# Patient Record
Sex: Female | Born: 1935 | Race: Black or African American | Hispanic: No | Smoking: Never smoker
Health system: Southern US, Community
[De-identification: ages and names within clinical notes are randomized; demographics above are authoritative.]

## PROBLEM LIST (undated history)

## (undated) DIAGNOSIS — R519 Headache, unspecified: Secondary | ICD-10-CM

## (undated) DIAGNOSIS — E119 Type 2 diabetes mellitus without complications: Secondary | ICD-10-CM

## (undated) DIAGNOSIS — M1990 Unspecified osteoarthritis, unspecified site: Secondary | ICD-10-CM

## (undated) DIAGNOSIS — E039 Hypothyroidism, unspecified: Secondary | ICD-10-CM

## (undated) DIAGNOSIS — N183 Chronic kidney disease, stage 3 unspecified: Secondary | ICD-10-CM

## (undated) DIAGNOSIS — I441 Atrioventricular block, second degree: Secondary | ICD-10-CM

## (undated) DIAGNOSIS — R51 Headache: Secondary | ICD-10-CM

## (undated) DIAGNOSIS — Z9109 Other allergy status, other than to drugs and biological substances: Secondary | ICD-10-CM

## (undated) DIAGNOSIS — R1013 Epigastric pain: Secondary | ICD-10-CM

## (undated) DIAGNOSIS — K259 Gastric ulcer, unspecified as acute or chronic, without hemorrhage or perforation: Secondary | ICD-10-CM

## (undated) DIAGNOSIS — I5042 Chronic combined systolic (congestive) and diastolic (congestive) heart failure: Secondary | ICD-10-CM

## (undated) DIAGNOSIS — H548 Legal blindness, as defined in USA: Secondary | ICD-10-CM

## (undated) DIAGNOSIS — I119 Hypertensive heart disease without heart failure: Secondary | ICD-10-CM

## (undated) DIAGNOSIS — I82629 Acute embolism and thrombosis of deep veins of unspecified upper extremity: Secondary | ICD-10-CM

## (undated) DIAGNOSIS — Z9289 Personal history of other medical treatment: Secondary | ICD-10-CM

## (undated) DIAGNOSIS — I480 Paroxysmal atrial fibrillation: Secondary | ICD-10-CM

## (undated) DIAGNOSIS — I2510 Atherosclerotic heart disease of native coronary artery without angina pectoris: Secondary | ICD-10-CM

## (undated) DIAGNOSIS — K449 Diaphragmatic hernia without obstruction or gangrene: Secondary | ICD-10-CM

## (undated) DIAGNOSIS — Z8719 Personal history of other diseases of the digestive system: Secondary | ICD-10-CM

## (undated) DIAGNOSIS — Z950 Presence of cardiac pacemaker: Secondary | ICD-10-CM

## (undated) DIAGNOSIS — D649 Anemia, unspecified: Secondary | ICD-10-CM

## (undated) DIAGNOSIS — E7800 Pure hypercholesterolemia, unspecified: Secondary | ICD-10-CM

## (undated) DIAGNOSIS — K219 Gastro-esophageal reflux disease without esophagitis: Secondary | ICD-10-CM

## (undated) DIAGNOSIS — N390 Urinary tract infection, site not specified: Secondary | ICD-10-CM

## (undated) DIAGNOSIS — J302 Other seasonal allergic rhinitis: Secondary | ICD-10-CM

## (undated) HISTORY — DX: Chronic combined systolic (congestive) and diastolic (congestive) heart failure: I50.42

## (undated) HISTORY — DX: Chronic kidney disease, stage 3 unspecified: N18.30

## (undated) HISTORY — DX: Hypothyroidism, unspecified: E03.9

## (undated) HISTORY — DX: Paroxysmal atrial fibrillation: I48.0

## (undated) HISTORY — DX: Diaphragmatic hernia without obstruction or gangrene: K44.9

## (undated) HISTORY — PX: REFRACTIVE SURGERY: SHX103

## (undated) HISTORY — DX: Gastric ulcer, unspecified as acute or chronic, without hemorrhage or perforation: K25.9

## (undated) HISTORY — DX: Hypertensive heart disease without heart failure: I11.9

## (undated) HISTORY — PX: CATARACT EXTRACTION W/ INTRAOCULAR LENS  IMPLANT, BILATERAL: SHX1307

## (undated) HISTORY — DX: Personal history of other diseases of the digestive system: Z87.19

## (undated) HISTORY — DX: Epigastric pain: R10.13

## (undated) HISTORY — DX: Anemia, unspecified: D64.9

## (undated) HISTORY — DX: Urinary tract infection, site not specified: N39.0

## (undated) HISTORY — DX: Chronic kidney disease, stage 3 (moderate): N18.3

---

## 1992-10-25 HISTORY — PX: CARDIAC CATHETERIZATION: SHX172

## 1992-10-27 HISTORY — PX: CORONARY ARTERY BYPASS GRAFT: SHX141

## 1997-03-25 ENCOUNTER — Ambulatory Visit (HOSPITAL_COMMUNITY): Admission: RE | Admit: 1997-03-25 | Discharge: 1997-03-25 | Payer: Self-pay | Admitting: Internal Medicine

## 1997-05-26 ENCOUNTER — Ambulatory Visit (HOSPITAL_COMMUNITY): Admission: RE | Admit: 1997-05-26 | Discharge: 1997-05-26 | Payer: Self-pay | Admitting: Internal Medicine

## 1997-05-30 ENCOUNTER — Ambulatory Visit (HOSPITAL_COMMUNITY): Admission: RE | Admit: 1997-05-30 | Discharge: 1997-05-30 | Payer: Self-pay | Admitting: Internal Medicine

## 1997-06-05 ENCOUNTER — Ambulatory Visit (HOSPITAL_COMMUNITY): Admission: RE | Admit: 1997-06-05 | Discharge: 1997-06-05 | Payer: Self-pay | Admitting: Internal Medicine

## 1997-08-03 ENCOUNTER — Other Ambulatory Visit: Admission: RE | Admit: 1997-08-03 | Discharge: 1997-08-03 | Payer: Self-pay | Admitting: Gastroenterology

## 1999-11-20 ENCOUNTER — Inpatient Hospital Stay (HOSPITAL_COMMUNITY): Admission: EM | Admit: 1999-11-20 | Discharge: 1999-11-27 | Payer: Self-pay | Admitting: Internal Medicine

## 1999-11-21 ENCOUNTER — Encounter: Payer: Self-pay | Admitting: Internal Medicine

## 1999-11-23 ENCOUNTER — Encounter: Payer: Self-pay | Admitting: Internal Medicine

## 1999-12-19 ENCOUNTER — Other Ambulatory Visit: Admission: RE | Admit: 1999-12-19 | Discharge: 1999-12-19 | Payer: Self-pay | Admitting: Ophthalmology

## 1999-12-19 ENCOUNTER — Encounter (INDEPENDENT_AMBULATORY_CARE_PROVIDER_SITE_OTHER): Payer: Self-pay | Admitting: Specialist

## 2000-05-04 ENCOUNTER — Encounter: Payer: Self-pay | Admitting: Emergency Medicine

## 2000-05-04 ENCOUNTER — Emergency Department (HOSPITAL_COMMUNITY): Admission: EM | Admit: 2000-05-04 | Discharge: 2000-05-04 | Payer: Self-pay | Admitting: Emergency Medicine

## 2000-12-17 ENCOUNTER — Encounter: Admission: RE | Admit: 2000-12-17 | Discharge: 2001-01-17 | Payer: Self-pay | Admitting: Internal Medicine

## 2002-05-02 ENCOUNTER — Ambulatory Visit (HOSPITAL_COMMUNITY): Admission: RE | Admit: 2002-05-02 | Discharge: 2002-05-02 | Payer: Self-pay | Admitting: Internal Medicine

## 2002-07-07 ENCOUNTER — Encounter: Payer: Self-pay | Admitting: Internal Medicine

## 2002-12-01 ENCOUNTER — Ambulatory Visit (HOSPITAL_BASED_OUTPATIENT_CLINIC_OR_DEPARTMENT_OTHER): Admission: RE | Admit: 2002-12-01 | Discharge: 2002-12-01 | Payer: Self-pay | Admitting: Internal Medicine

## 2002-12-25 ENCOUNTER — Ambulatory Visit (HOSPITAL_COMMUNITY): Admission: RE | Admit: 2002-12-25 | Discharge: 2002-12-25 | Payer: Self-pay | Admitting: Internal Medicine

## 2003-03-24 ENCOUNTER — Encounter: Admission: RE | Admit: 2003-03-24 | Discharge: 2003-03-24 | Payer: Self-pay | Admitting: Internal Medicine

## 2003-11-09 ENCOUNTER — Encounter: Admission: RE | Admit: 2003-11-09 | Discharge: 2003-11-09 | Payer: Self-pay | Admitting: Internal Medicine

## 2003-11-13 ENCOUNTER — Encounter: Admission: RE | Admit: 2003-11-13 | Discharge: 2003-11-13 | Payer: Self-pay | Admitting: Cardiology

## 2003-11-18 ENCOUNTER — Ambulatory Visit (HOSPITAL_COMMUNITY): Admission: RE | Admit: 2003-11-18 | Discharge: 2003-11-18 | Payer: Self-pay | Admitting: Cardiology

## 2003-11-18 HISTORY — PX: CARDIAC CATHETERIZATION: SHX172

## 2004-02-16 ENCOUNTER — Encounter: Admission: RE | Admit: 2004-02-16 | Discharge: 2004-02-16 | Payer: Self-pay | Admitting: Internal Medicine

## 2004-10-04 ENCOUNTER — Emergency Department (HOSPITAL_COMMUNITY): Admission: EM | Admit: 2004-10-04 | Discharge: 2004-10-04 | Payer: Self-pay | Admitting: Emergency Medicine

## 2005-01-24 ENCOUNTER — Inpatient Hospital Stay (HOSPITAL_COMMUNITY): Admission: EM | Admit: 2005-01-24 | Discharge: 2005-01-27 | Payer: Self-pay | Admitting: Emergency Medicine

## 2005-01-24 HISTORY — PX: CARDIAC CATHETERIZATION: SHX172

## 2005-04-19 ENCOUNTER — Encounter: Admission: RE | Admit: 2005-04-19 | Discharge: 2005-05-16 | Payer: Self-pay | Admitting: Internal Medicine

## 2006-04-06 ENCOUNTER — Inpatient Hospital Stay (HOSPITAL_COMMUNITY): Admission: EM | Admit: 2006-04-06 | Discharge: 2006-04-18 | Payer: Self-pay | Admitting: Emergency Medicine

## 2006-04-09 ENCOUNTER — Encounter (INDEPENDENT_AMBULATORY_CARE_PROVIDER_SITE_OTHER): Payer: Self-pay | Admitting: Cardiovascular Disease

## 2006-04-13 ENCOUNTER — Encounter: Payer: Self-pay | Admitting: Vascular Surgery

## 2006-04-13 ENCOUNTER — Ambulatory Visit: Payer: Self-pay | Admitting: Vascular Surgery

## 2006-06-05 ENCOUNTER — Inpatient Hospital Stay (HOSPITAL_COMMUNITY): Admission: EM | Admit: 2006-06-05 | Discharge: 2006-06-09 | Payer: Self-pay | Admitting: Emergency Medicine

## 2006-06-06 HISTORY — PX: CARDIAC CATHETERIZATION: SHX172

## 2006-06-06 HISTORY — PX: CARDIOVERSION: SHX1299

## 2006-06-07 ENCOUNTER — Encounter (INDEPENDENT_AMBULATORY_CARE_PROVIDER_SITE_OTHER): Payer: Self-pay | Admitting: Cardiovascular Disease

## 2006-06-08 ENCOUNTER — Ambulatory Visit: Payer: Self-pay | Admitting: Cardiothoracic Surgery

## 2006-06-15 ENCOUNTER — Encounter: Admission: RE | Admit: 2006-06-15 | Discharge: 2006-06-15 | Payer: Self-pay | Admitting: Internal Medicine

## 2006-06-29 ENCOUNTER — Ambulatory Visit (HOSPITAL_COMMUNITY): Admission: RE | Admit: 2006-06-29 | Discharge: 2006-06-30 | Payer: Self-pay | Admitting: *Deleted

## 2006-06-29 HISTORY — PX: INSERT / REPLACE / REMOVE PACEMAKER: SUR710

## 2007-02-17 ENCOUNTER — Emergency Department (HOSPITAL_COMMUNITY): Admission: EM | Admit: 2007-02-17 | Discharge: 2007-02-17 | Payer: Self-pay | Admitting: Emergency Medicine

## 2007-03-27 ENCOUNTER — Encounter: Admission: RE | Admit: 2007-03-27 | Discharge: 2007-03-27 | Payer: Self-pay | Admitting: Internal Medicine

## 2007-06-12 ENCOUNTER — Encounter: Admission: RE | Admit: 2007-06-12 | Discharge: 2007-06-12 | Payer: Self-pay | Admitting: Internal Medicine

## 2007-08-09 ENCOUNTER — Inpatient Hospital Stay (HOSPITAL_COMMUNITY): Admission: EM | Admit: 2007-08-09 | Discharge: 2007-08-13 | Payer: Self-pay | Admitting: Emergency Medicine

## 2008-01-08 ENCOUNTER — Emergency Department (HOSPITAL_COMMUNITY): Admission: EM | Admit: 2008-01-08 | Discharge: 2008-01-08 | Payer: Self-pay | Admitting: Emergency Medicine

## 2008-06-22 ENCOUNTER — Encounter: Admission: RE | Admit: 2008-06-22 | Discharge: 2008-06-22 | Payer: Self-pay | Admitting: Internal Medicine

## 2008-08-18 ENCOUNTER — Emergency Department (HOSPITAL_COMMUNITY): Admission: EM | Admit: 2008-08-18 | Discharge: 2008-08-19 | Payer: Self-pay | Admitting: Emergency Medicine

## 2008-08-25 ENCOUNTER — Encounter: Admission: RE | Admit: 2008-08-25 | Discharge: 2008-08-25 | Payer: Self-pay | Admitting: Internal Medicine

## 2008-12-20 ENCOUNTER — Emergency Department (HOSPITAL_COMMUNITY): Admission: EM | Admit: 2008-12-20 | Discharge: 2008-12-20 | Payer: Self-pay | Admitting: Emergency Medicine

## 2009-04-22 ENCOUNTER — Encounter: Admission: RE | Admit: 2009-04-22 | Discharge: 2009-04-22 | Payer: Self-pay | Admitting: Sports Medicine

## 2009-05-07 ENCOUNTER — Encounter: Admission: RE | Admit: 2009-05-07 | Discharge: 2009-05-07 | Payer: Self-pay | Admitting: Sports Medicine

## 2009-06-05 ENCOUNTER — Emergency Department (HOSPITAL_COMMUNITY): Admission: EM | Admit: 2009-06-05 | Discharge: 2009-06-05 | Payer: Self-pay | Admitting: Emergency Medicine

## 2009-06-16 ENCOUNTER — Encounter: Admission: RE | Admit: 2009-06-16 | Discharge: 2009-06-16 | Payer: Self-pay | Admitting: Sports Medicine

## 2009-07-16 ENCOUNTER — Ambulatory Visit: Payer: Self-pay | Admitting: Vascular Surgery

## 2009-09-13 ENCOUNTER — Emergency Department (HOSPITAL_COMMUNITY): Admission: EM | Admit: 2009-09-13 | Discharge: 2009-09-13 | Payer: Self-pay | Admitting: Emergency Medicine

## 2010-03-31 LAB — CBC
Hemoglobin: 10.7 g/dL — ABNORMAL LOW (ref 12.0–15.0)
MCH: 28.5 pg (ref 26.0–34.0)
RBC: 3.76 MIL/uL — ABNORMAL LOW (ref 3.87–5.11)

## 2010-03-31 LAB — DIFFERENTIAL
Lymphocytes Relative: 31 % (ref 12–46)
Lymphs Abs: 2.2 10*3/uL (ref 0.7–4.0)
Monocytes Absolute: 0.4 10*3/uL (ref 0.1–1.0)
Monocytes Relative: 6 % (ref 3–12)
Neutro Abs: 4.3 10*3/uL (ref 1.7–7.7)
Neutrophils Relative %: 62 % (ref 43–77)

## 2010-03-31 LAB — COMPREHENSIVE METABOLIC PANEL
AST: 15 U/L (ref 0–37)
Albumin: 3 g/dL — ABNORMAL LOW (ref 3.5–5.2)
BUN: 33 mg/dL — ABNORMAL HIGH (ref 6–23)
Chloride: 107 mEq/L (ref 96–112)
Creatinine, Ser: 1.99 mg/dL — ABNORMAL HIGH (ref 0.4–1.2)
Total Protein: 6.4 g/dL (ref 6.0–8.3)

## 2010-03-31 LAB — URINE MICROSCOPIC-ADD ON

## 2010-03-31 LAB — URINALYSIS, ROUTINE W REFLEX MICROSCOPIC
Glucose, UA: NEGATIVE mg/dL
Hgb urine dipstick: NEGATIVE
Specific Gravity, Urine: 1.022 (ref 1.005–1.030)
Urobilinogen, UA: 1 mg/dL (ref 0.0–1.0)

## 2010-04-04 LAB — CBC
HCT: 37.2 % (ref 36.0–46.0)
MCHC: 33.5 g/dL (ref 30.0–36.0)
MCV: 87.2 fL (ref 78.0–100.0)
Platelets: 159 10*3/uL (ref 150–400)
RDW: 15 % (ref 11.5–15.5)
WBC: 6.2 10*3/uL (ref 4.0–10.5)

## 2010-04-04 LAB — URINALYSIS, ROUTINE W REFLEX MICROSCOPIC
Bilirubin Urine: NEGATIVE
Glucose, UA: 250 mg/dL — AB
Ketones, ur: NEGATIVE mg/dL
Protein, ur: NEGATIVE mg/dL

## 2010-04-04 LAB — POCT CARDIAC MARKERS
CKMB, poc: <1 ng/mL — ABNORMAL LOW (ref 1.0–8.0)
Myoglobin, poc: 108 ng/mL (ref 12–200)
Myoglobin, poc: 94.1 ng/mL (ref 12–200)
Troponin i, poc: <0.05 ng/mL (ref 0.00–0.09)

## 2010-04-04 LAB — BASIC METABOLIC PANEL
BUN: 15 mg/dL (ref 6–23)
Chloride: 105 mEq/L (ref 96–112)
Creatinine, Ser: 1.38 mg/dL — ABNORMAL HIGH (ref 0.4–1.2)
Glucose, Bld: 258 mg/dL — ABNORMAL HIGH (ref 70–99)
Potassium: 4.8 mEq/L (ref 3.5–5.1)

## 2010-04-04 LAB — DIFFERENTIAL
Basophils Absolute: 0 10*3/uL (ref 0.0–0.1)
Basophils Relative: 0 % (ref 0–1)
Eosinophils Absolute: 0 10*3/uL (ref 0.0–0.7)
Eosinophils Relative: 1 % (ref 0–5)
Lymphs Abs: 1.4 10*3/uL (ref 0.7–4.0)
Neutrophils Relative %: 74 % (ref 43–77)

## 2010-04-04 LAB — PROTIME-INR
INR: 1 (ref 0.00–1.49)
Prothrombin Time: 13.1 seconds (ref 11.6–15.2)

## 2010-04-19 LAB — CBC
HCT: 33.7 % — ABNORMAL LOW (ref 36.0–46.0)
Hemoglobin: 11.2 g/dL — ABNORMAL LOW (ref 12.0–15.0)
MCHC: 33.2 g/dL (ref 30.0–36.0)
MCV: 88.1 fL (ref 78.0–100.0)
Platelets: 185 10*3/uL (ref 150–400)
RBC: 3.82 MIL/uL — ABNORMAL LOW (ref 3.87–5.11)
RDW: 15.8 % — ABNORMAL HIGH (ref 11.5–15.5)
WBC: 5.8 10*3/uL (ref 4.0–10.5)

## 2010-04-19 LAB — POCT I-STAT, CHEM 8
Chloride: 106 mEq/L (ref 96–112)
Creatinine, Ser: 1.3 mg/dL — ABNORMAL HIGH (ref 0.4–1.2)
HCT: 34 % — ABNORMAL LOW (ref 36.0–46.0)
Hemoglobin: 11.6 g/dL — ABNORMAL LOW (ref 12.0–15.0)
Potassium: 4.7 mEq/L (ref 3.5–5.1)
Sodium: 142 mEq/L (ref 135–145)

## 2010-04-19 LAB — POCT CARDIAC MARKERS
CKMB, poc: <1 ng/mL — ABNORMAL LOW (ref 1.0–8.0)
CKMB, poc: <1 ng/mL — ABNORMAL LOW (ref 1.0–8.0)
CKMB, poc: <1 ng/mL — ABNORMAL LOW (ref 1.0–8.0)
Myoglobin, poc: 142 ng/mL (ref 12–200)
Myoglobin, poc: 166 ng/mL (ref 12–200)
Myoglobin, poc: 181 ng/mL (ref 12–200)
Troponin i, poc: <0.05 ng/mL (ref 0.00–0.09)
Troponin i, poc: <0.05 ng/mL (ref 0.00–0.09)
Troponin i, poc: <0.05 ng/mL (ref 0.00–0.09)

## 2010-04-19 LAB — DIFFERENTIAL
Basophils Relative: 1 % (ref 0–1)
Eosinophils Absolute: 0.1 10*3/uL (ref 0.0–0.7)
Lymphs Abs: 1.6 10*3/uL (ref 0.7–4.0)
Monocytes Relative: 6 % (ref 3–12)
Neutro Abs: 3.8 10*3/uL (ref 1.7–7.7)
Neutrophils Relative %: 65 % (ref 43–77)

## 2010-04-23 LAB — COMPREHENSIVE METABOLIC PANEL
ALT: 9 U/L (ref 0–35)
AST: 18 U/L (ref 0–37)
Albumin: 3.4 g/dL — ABNORMAL LOW (ref 3.5–5.2)
Alkaline Phosphatase: 117 U/L (ref 39–117)
Chloride: 105 mEq/L (ref 96–112)
GFR calc Af Amer: 38 mL/min — ABNORMAL LOW (ref >60)
Potassium: 4.4 mEq/L (ref 3.5–5.1)
Sodium: 136 mEq/L (ref 135–145)
Total Bilirubin: 0.8 mg/dL (ref 0.3–1.2)

## 2010-04-23 LAB — URINE MICROSCOPIC-ADD ON

## 2010-04-23 LAB — DIFFERENTIAL
Basophils Absolute: 0 10*3/uL (ref 0.0–0.1)
Basophils Relative: 1 % (ref 0–1)
Eosinophils Absolute: 0 10*3/uL (ref 0.0–0.7)
Eosinophils Relative: 0 % (ref 0–5)
Lymphocytes Relative: 20 % (ref 12–46)
Monocytes Absolute: 0.4 10*3/uL (ref 0.1–1.0)

## 2010-04-23 LAB — URINALYSIS, ROUTINE W REFLEX MICROSCOPIC
Bilirubin Urine: NEGATIVE
Hgb urine dipstick: NEGATIVE
Ketones, ur: NEGATIVE mg/dL
Specific Gravity, Urine: 1.018 (ref 1.005–1.030)
Urobilinogen, UA: 1 mg/dL (ref 0.0–1.0)

## 2010-04-23 LAB — CBC
Platelets: 231 10*3/uL (ref 150–400)
RBC: 4.56 MIL/uL (ref 3.87–5.11)
WBC: 7.1 10*3/uL (ref 4.0–10.5)

## 2010-05-31 NOTE — Procedures (Signed)
DUPLEX DEEP VENOUS EXAM - LOWER EXTREMITY   INDICATION:  Bilateral lower extremity pain and swelling.   HISTORY:  Edema:  For approximately 3 weeks.  Trauma/Surgery:  Vein harvest on right calf.  Pain:  Three weeks.  PE:  No.  Previous DVT:  No.  Anticoagulants:  No.  Other:   DUPLEX EXAM:                CFV   SFV   PopV  PTV    GSV                R  L  R  L  R  L  R   L  R  L  Thrombosis    o  o  o  o  o  o  o   o  o  o  Spontaneous   +  +  +  +  +  +  +   +  +  +  Phasic        +  +  +  +  +  +  +   +  +  +  Augmentation  +  +  +  +  +  +  +   +  +  +  Compressible  +  +  +  +  +  +  +   +  +  +  Competent     +  +  +  +  +  +  +   +  d  d   Legend:  + - yes  o - no  p - partial  D - decreased   IMPRESSION:  1. No evidence of deep or superficial venous thrombosis in the      bilateral lower extremities.  2. Right greater saphenous vein in the calf appears to have been      harvested.   Preliminary results were phoned to Dr. August Saucer.      _____________________________  Quita Skye. Hart Rochester, M.D.   AS/MEDQ  D:  07/16/2009  T:  07/16/2009  Job:  [id number]

## 2010-05-31 NOTE — Cardiovascular Report (Signed)
[name], HELF           ACCOUNT NO.:  [id number]   MEDICAL RECORD NO.:  [id number]          PATIENT TYPE:  INP   LOCATION:  3703                         FACILITY:  [facility]   PHYSICIAN:  Nicki Guadalajara, M.D.     DATE OF BIRTH:  07/27/1920   DATE OF PROCEDURE:  06/06/2006  DATE OF DISCHARGE:                            CARDIAC CATHETERIZATION   INDICATIONS FOR PROCEDURE:  Ms. [name] is an 77 year old  African-American female with known coronary artery disease status post  prior CABG revascularization surgery in 1994 by Dr. Laneta Simmers with a LIMA  to the LAD, vein graft to the circumflex marginal, vein graft to the  second diagonal branch of the LAD, and vein graft to the PDA branch of  the right coronary artery.  The patient's last catheterization in  January 2007 showed patent grafts, but she did have diffuse disease  beyond in the diagonal vessel beyond the graft insertion and apparently  had 80% left renal artery stenosis.  The patient was admitted to [facility] yesterday with chest pain.  She also has had some episodes  of dizziness.  There was a history of transient heart block, second-  degree, and is now referred for definitive diagnostic cardiac  catheterization.   PROCEDURE:  After premedication with Valium 4 mg intravenously, the  patient prepped and draped in the usual fashion.  Her right femoral  artery was punctured anteriorly and a 5-French sheath was inserted.  Diagnostic catheterization was done utilizing 5-French Judkins 4 left  and right coronary catheters.  The right catheter was also used for  selective angiography into the saphenous vein grafts.  Following the  initial injection into the saphenous vein graft supplying the severely  diseased diagonal vessel, the patient went into VT/VF and was  immediately shocked and successfully defibrillated back to sinus rhythm.  A LIMA catheter was used for selective angiography in the left internal  mammary artery.  A 5-French pigtail catheter was used for biplane cine  left ventriculography.  However, with the LAO injection, it appeared  that there may have been a stalk arising from the posterior wall with  filling representing a possible pseudoaneurysm.  It did not seem to be  in the proper position to account for a VSD.  However, to be certain,  right heart catheterization was then performed with O2 saturation runs  in several locations in the RA, RV, mean PA and right pulmonary artery.  Cardiac output was obtained by the thermodilution and Fick method.  The  patient tolerated the procedure well.  Cineangiograms will be reviewed  with colleagues.  The patient will also be scheduled for a 2-D echo  Doppler study to further evaluate a posterior wall true aneurysm versus  pseudoaneurysm.   HEMODYNAMIC DATA:  Central aortic pressure is 120/53, left ventricle  pressure is 120/11, post A-wave 21.   The right heart pressures at the completion of the LV gram showed a  right atrial A-wave 17, V 19, mean 15, right ventricular pressure 31/18,  pulmonary pressure 31/23, mean pulmonary capillary wedge pressure 25, V-  wave 32.   O2  saturation in the mid RA 61%, in the low RA 67%, at the RV apex 64%,  mid RV 64%, RV OT 62%, PA 62%, main PA 66%.  O2 saturation in the  central aorta was 99%.  Simultaneous LV PC pressures also suggested an 8  mm gradient.   Cardiac output was 3.7 by the Fick method and 3.3 liters per minute by  the thermodilution method.  Cardiac index was 1.8 and 1.6 liters per  minute per meter squared, respectively.   ANGIOGRAPHIC DATA:  Left main coronary artery was angiographically  normal and bifurcated to the LAD and left circumflex system.   The LAD gave rise to a proximal septal perforating artery.  The first  diagonal vessel had diffuse 90% stenosis.  Just after the diagonal  vessel was 90% stenosis in the LAD prior to total occlusion.   The circumflex vessel  was totally occluded proximally.   The right coronary artery was a moderate-sized vessel that had diffuse  70% proximal to mid stenoses and distal 70% stenosis before the PDA  takeoff.   The vein graft supplying the PDA branch of the right coronary artery was  widely patent.   The vein graft supplying the diagonal two-vessel was widely patent;  however, there was diffuse subtotal stenoses of 99% in a very small  diminutive second diagonal vessel beyond the anastomoses.  Next, the  vein graft supplying the marginal vessel was a large caliber vessel, and  there now appeared to be a 70-75% stenosis in the distal third of the  graft which is new from the prior study of 2007.  This did not  significantly improved following IC nitroglycerin 100 mcg injection.   The left internal mammary artery was patent and supplied the mid LAD.   RAO ventriculography revealed hypocontractility in the mid to basal  posterior wall with mild hypocontractility in the distal anterolateral  wall.  Ejection fraction 50-55%.  On the LAO projection, the septal wall  appeared to contract fairly normally, as did the apex.  In the mid  __________ portion of the posterior wall, there appeared to be a stalk  and then filling was seen.  Since this was not the correct site for a  VSD, it raised the suspicion of possible pseudoaneurysm arising from the  posterior wall in this patient with known total native circumflex  occlusion.   Distal aortography revealed 60-70% left renal artery stenosis.   IMPRESSION:  1. 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  2. Severe native coronary artery disease with diffuse 90% proximal      diagonal stenosis, 90% followed by total occlusion of the proximal     to the mid left anterior descending, total  occlusion of the      proximal circumflex, and diffuse 70% stenoses in the mid distal      RCA.  3. Patent vein graft supplying the posterior descending artery.  4. Patent vein graft supplying the diagonal two-vessel but with      diffuse 99% stenoses in a diffusely diseased small diagonal two-      vessel.  5. Seventy to seventy-five percent stenosis in the distal third of the      vein graft supplying the circumflex marginal vessel and moderate      size  vessel with left collateralization to the AV groove circumflex      compared to prior films, and patent left internal mammary artery to      the mid-left anterior descending.  6. No evidence for VSD by virtue of O2 saturation run.  7. Successful cardioversion/defibrillation following the VT/VF after      the initial injection into the vein graft supplying the diagonal      vessel.           ______________________________  Nicki Guadalajara, M.D.    TK/MEDQ  D:  06/06/2006  T:  06/06/2006  Job:  [id number]   cc:   Thereasa Solo. Little, M.D.

## 2010-05-31 NOTE — Discharge Summary (Signed)
[name], SUPAK           ACCOUNT NO.:  [id number]   MEDICAL RECORD NO.:  [id number]          PATIENT TYPE:  OIB   LOCATION:  6523                         FACILITY:  [facility]   PHYSICIAN:  Darlin Priestly, MD  DATE OF BIRTH:  07/27/1920   DATE OF ADMISSION:  06/29/2006  DATE OF DISCHARGE:  06/30/2006                               DISCHARGE SUMMARY   DISCHARGE DIAGNOSES:  1. Second-degree AV block Wenckebach's.  2. Status post permanent transvenous pacemaker insertion and [employer]      Adapta ADDRO1, serial number [id number], VVI mode.  3. History of anemia.  4. History of coronary artery disease with stable grafts after cath in      May of 2008.  5. Diabetes mellitus 2, insulin dependent.  6. History of hypertension.   DISCHARGE CONDITION:  Stable and improved.   PROCEDURES:  June 29, 2006, placement of a dual-chamber pacemaker by Dr.  Lenise Herald.   HISTORY OF PRESENT ILLNESS:  An 77 year old female patient of Dr. Mindi Junker  Little's, was seen by Dr. Clarene Duke on June 22, 2006 after wearing an Event  monitor after being hospitalized for chest pain and having some second-  degree AV block in the hospital.  She had complained of some dizziness  prior to admission and there was no correlation between her dizziness  and the second-degree type 1 Wenckebach in the hospital.  Previously,  beta blockers had caused second-degree block, but they had been  discontinued as well as calcium channel blocker had been discontinued.  She had been put on a vent monitor and continued to have occasional  episodes of Wenckebach, but now does have dizziness that correlates with  the arrhythmias.   Previously had bypass surgery in 1994 and a cath in January of 2007 with  patent grafts, re-cath in May of 2008, again patent grafts, but she did  have an episode of VFib, it was defibrillated and was stable at the end  of the procedure.  There was progression of her native disease, grafts  were patent  and there was a concern she had pseudoaneurysm of the  lateral wall of her heart.  Echo of her heart failed to show any lateral  wall abnormalities.  She continues with episodes of chest discomfort  intolerant to nitroglycerin.  She also has dyspnea on exertion.   ALLERGIES:  DARVOCET, DARVON, ULTRAM, TOPROL AND PERCOCET.  SHE HAD BEEN  INTOLERANT TO BETA BLOCKERS, BUT NOW SHE CAN TOLERATE THEM, AS SHE HAS A  PERMANENT PACEMAKER.   DISCHARGE MEDICATIONS:  1. Amaryl 4 mg daily.  2. Synthroid 100 mcg daily.  3. Plavix 75 mg daily.  4. Toprol XL 50 mg daily.  5. Cozaar 50 mg daily.  6. Claritin 10 mg daily.  7. Pletal 50 mg 1/2 of a 100 mg tablet twice a day.  8. Demodex 20 mg.  She states she has been doing it every three days      so will continue one every three days.  9. Aspirin 81 mg daily.  10.NovoLog insulin 8 units before breakfast and lunch, 14 units at  suppertime.  11.Lantus 15 units daily in the morning.  12.Vytorin 10 81 daily.  13.Imdur 30 mg daily.  14.Iron 150 mg daily.  15.Prevacid 30 mg daily.  16.Cipro 250 mg twice a day until gone.  17.Advair inhaler 250/50 as before.  She uses it rarely on a p.r.n.      basis.  18.Albuterol inhaler as before.  Again, uses rarely on a p.r.n. basis.  19.She takes potassium; she will take one with her Demodex.   DISCHARGE INSTRUCTIONS:  1. Low sodium, heart-healthy diabetic diet.  2. Follow up with Dr. Fredirick Maudlin nurse practitioner for wound check on      Thursday or Friday; the office will call with date and time.  3. Follow up with Dr. Lucianne Muss and Dr. August Saucer as before.  4. See pacemaker discharge instructions for complete details of pacer      discharge instructions.   HOSPITAL COURSE:  [name] was admitted June 29, 2006 for permanent  pacemaker insertion which she underwent without complications, tolerated  it well.  The next morning, she was stable, her chest x-ray showed no  pneumothorax, and she was ready after  ambulating the hallway for  discharge home.  She will followup as an outpatient with Dr. Jenne Campus for  pacemaker check in three months, with Dr. Clarene Duke for cardiology and  nurse practitioner next week for pacer site check.      [name]. [name]      Darlin Priestly, MD  Electronically Signed    LRI/MEDQ  D:  06/30/2006  T:  06/30/2006  Job:  [id number]   cc:   Thereasa Solo. Little, M.D.  Reather Littler, M.D.  Dr. August Saucer

## 2010-05-31 NOTE — Discharge Summary (Signed)
NAMEMISHAEL, KRYSIAK           ACCOUNT NO.:  [id number]   MEDICAL RECORD NO.:  [id number]          PATIENT TYPE:  INP   LOCATION:  3701                         FACILITY:  [facility]   PHYSICIAN:  Thereasa Solo. Little, M.D. DATE OF BIRTH:  07/27/1920   DATE OF ADMISSION:  08/09/2007  DATE OF DISCHARGE:  08/13/2007                               DISCHARGE SUMMARY   DISCHARGE DIAGNOSES:  1. Chest pain, noncardiac with no myocardial infarction by enzymes      this admission.  2. Gastroesophageal reflux with essentially negative EGD with a      history of positive stools this admission.  3. Anemia, mild.  4. Paroxysmal atrial fibrillation and sick sinus syndrome, not a      Coumadin candidate.  5. Treated hypertension.  6. Treated dyslipidemia.  7. Treated hypothyroidism.   HOSPITAL COURSE:  Ms. Muhs is an 77 year old female who is followed  by Dr. Clarene Duke and Dr. August Saucer with a history of coronary artery disease.  She had bypass surgery in 1994 and with last cath in May 2008 and had  patent grafts and good LV function.  She presented to the emergency room  on August 09, 2007 with chest pressure.  Her symptoms were worrisome for  unstable angina.  She was admitted by Dr. Domingo Sep.  She was started on  IV heparin.  Enzymes were negative.  Hemoglobin was on the low side and  stool guaiacs were positive.  She was seen in consult by Dr. Carman Ching for GI evaluation.  Endoscopy done on August 13, 2007 was  unremarkable.  Plan is to send her home on PPI, which she has been on  before and follow up with Dr. Randa Evens in 4-6 weeks.   DISCHARGE MEDICATIONS:  1. Crestor 20 mg a day.  2. Protonix 40 mg a day.  3. Vitamin D 125 mg a day.  4. Pletal 50 mg b.i.d.  5. Potassium 20 mEq a day.  6. Metoprolol succinate 50 mg daily.  7. Levoxyl 0.088 mg a day.  8. Torsemide 20 mg every other day.  9. Cozaar 50 mg daily.  10.Ambien 5 mg at bedtime p.r.n.  11.Nitroglycerin patch has been increased to 0.6  mg daily.  12.MiraLax p.r.n.  13.Norvasc 2.5 mg a day has been added.   LABORATORY DATA:  White count 6.9, hemoglobin 10.3, hematocrit, 31.3,  platelets 169, sodium 137, potassium 4.3, BUN 17, creatinine 1.3, and  glucose 96.  LFTs are normal.  CK-MB and troponin are negative x3.  Cholesterol is 148, LDL 90, and HDL 37.  Urine culture obtained on 24th  showed multiple species.  B12 level is 502, BNP 199, hemoglobin A1c 7.6,  and TSH 0.32.  Chest x-ray, left basilar atelectasis.   DISPOSITION:  The patient is discharged in stable condition and will  follow up with Dr. Clarene Duke, Dr. August Saucer, and Dr. Randa Evens.  I should note  that the EKG shows sinus rhythm, right bundle-branch block, which is old  and first-degree AV block.      Abelino Derrick, P.A.    ______________________________  Thereasa Solo. Little, M.D.  LKK/MEDQ  D:  08/13/2007  T:  08/14/2007  Job:  [id number]   cc:   Fayrene Fearing L. Malon Kindle., M.D.  Thereasa Solo. Little, M.D.  [name] L. August Saucer, M.D.  Reather Littler, M.D.

## 2010-05-31 NOTE — Op Note (Signed)
NAME:  [name], [name]           ACCOUNT NO.:  [id number]   MEDICAL RECORD NO.:  [id number]          PATIENT TYPE:  INP   LOCATION:  3701                         FACILITY:  [facility]   PHYSICIAN:  James L. Malon Kindle., M.D.DATE OF BIRTH:  07/27/1920   DATE OF PROCEDURE:  08/13/2007  DATE OF DISCHARGE:                               OPERATIVE REPORT   PROCEDURE:  Esophagogastroduodenoscopy.   MEDICATIONS:  1. Cetacaine spray.  2. Fentanyl 25 mcg.  3. Versed 2 mg IV.   INDICATIONS:  Chest pain, heme-positive stool in a woman who just had a  colonoscopy a year ago.   DESCRIPTION OF PROCEDURE:  The procedure explained to the patient,  consent obtained.  In the left lateral decubitus position, the Pentax  operative  scope was inserted bluntly in the esophagus with __________  and advanced under direct visualization.  The stomach was entered,  pylorus identified, passed duodenum including the bulb and second  portion was seen well and was unremarkable.  Pyloric channel and antrum  of the stomach was normal.  Fundus and cardia seen on the retroflexed  view was normal.  There were no ulcerations or any other gross  abnormalities.  There was a small hiatal hernia.  The Z-line was seen  just above the apparent diaphragmatic hiatus.  The distal and proximal  esophagus were endoscopically normal.  The scope was withdrawn.  The  patient tolerated the procedure well and was maintained on low-flow  oxygen with pulse oximeter throughout the procedure with no obvious  problem.   ASSESSMENT:  Epigastric pain, chest pain, and heme-positive stool  without obvious cause found on endoscopy.   PLAN:  We will recommend the patient go home on a PPI and see me in the  office in 4-6 weeks.           ______________________________  Llana Aliment Malon Kindle., M.D.     Waldron Session  D:  08/13/2007  T:  08/13/2007  Job:  [id number]   cc:   Thereasa Solo. Little, M.D.  [name] L. August Saucer, M.D.

## 2010-05-31 NOTE — Consult Note (Signed)
NAME:  [name], [name]           ACCOUNT NO.:  [id number]   MEDICAL RECORD NO.:  [id number]          PATIENT TYPE:  INP   LOCATION:  3701                         FACILITY:  [facility]   PHYSICIAN:  James L. Randa Evens, M.D. DATE OF BIRTH:  07/27/1920   DATE OF CONSULTATION:  DATE OF DISCHARGE:                                 CONSULTATION   We were asked to see Ms. full today in consultation for heme-positive  anemia by Dr. Domingo Sep.   HISTORY OF PRESENT ILLNESS:  This is an 77 year old African American  female who has a history significant for primarily coronary artery  disease.  She is status post coronary artery bypass grafting and  pacemaker placement.  However, she has also had chronic normocytic  anemia since at least 2001, as far back as the e-chart goes.  She was  admitted with chest pain on August 10, 2007; it is now resolved.  However,  she is also complaining of anorexia, a 20-pound weight loss since April  209, chronic constipation, and some lower left quadrant abdominal pain.  She tells me she has chronic gastritis.  Iron studies back in May 2008  were slightly low.  Her percent saturation was 17.  MCV value has always  been normal.  Her last colonoscopy was done in April 2008 for an ileus.  She was found to have diverticulosis and internal hemorrhoids.   PAST MEDICAL HISTORY:  1. Coronary artery disease.  She is status post coronary artery bypass      grafting, and status post pacemaker placement.  2. She has second-degree AV block.  3. Chronic anemia, type 2 diabetes.  She is insulin dependent.  4. She is legally blind.  5. She has hypertension.  6. She has a history of multiple CVAs.  7. Hypothyroidism.  8. Congestive heart failure.  9. As I mentioned, she did have a colonic ileus in March 2008, with      her last colonoscopy in April 2008 by Dr. Carman Ching.   CURRENT MEDICATIONS:  Per chart, but include Protonix and Pletal.   ALLERGIES:  Per chart.   REVIEW OF  SYSTEMS:  Significant for a 20-pound weight loss since April  2009, some headaches.  She denies dizziness and shortness of breath.   SOCIAL HISTORY:  Negative for alcohol, tobacco, and drugs.   PHYSICAL EXAMINATION:  GENERAL:  She is alert and oriented, very  talkative.  VITAL SIGNS:  Temperature is 98.7, pulse 74, respirations are 18, blood  pressure is 127/69.  ABDOMEN:  Soft, nontender, nondistended, obese.  RECTAL:  She has a very tight anal sphincter, which makes the exam  tender for her.  She has no external hemorrhoids.  She does have  internal hemorrhoids.  She is guaiac negative on my exam.   LABORATORIES TODAY:  Hemoglobin of 10, which appears as though it is  been approximately her baseline for the last several years.  MCV value  today it is 88.6, white count is 6.1, platelets 155,000, hematocrit  31.1.  BUN on August 10, 2007 was 18, creatinine 1.37, glucose is 182.  She did have two  guaiac-positive stools yesterday, August 11, 2007.  Her  iron studies back in May 2008 showed iron 39, TIBC 228, percent  saturation 17.   ASSESSMENT:  Dr. Carman Ching has seen and examined the patient,  collected a history, and reviewed her chart.  His impression is that  this is an 77 year old female with multiple medical problems who does  have chronic normocytic anemia.  Currently, she is very stable.  She  also has chronic constipation.  We will recheck an anemia panel,  including folate and vitamin B12.  Also, will check TTG for possible  celiac disease.  Probably will not repeat a colonoscopy at this time.  Primary may consider a CT, given her recent weight loss.  Will continue  PPI.  Recommend Daily MiraLax.  May consider outpatient endoscopy.   Thanks very much for this consultation.      Stephani Police, PA    ______________________________  Llana Aliment Randa Evens, M.D.    MLY/MEDQ  D:  08/12/2007  T:  08/12/2007  Job:  [id number]   cc:   Minerva Areola L. August Saucer, M.D.  Dani Gobble, MD

## 2010-05-31 NOTE — Discharge Summary (Signed)
[name], [name]           ACCOUNT NO.:  [id number]   MEDICAL RECORD NO.:  [id number]          PATIENT TYPE:  INP   LOCATION:  3703                         FACILITY:  [facility]   PHYSICIAN:  Thereasa Solo. Little, M.D. DATE OF BIRTH:  07/27/1920   DATE OF ADMISSION:  06/05/2006  DATE OF DISCHARGE:  06/09/2006                               DISCHARGE SUMMARY   DISCHARGE DIAGNOSES:  1. Chest pain, negative myocardial infarction.  Patent graft with      residual disease beyond the insertion of the graft to the diagonal      2 and new disease in the saphenous vein graft to the circumflex.  2. Transient second-degree atrioventricular block, resolved.  3. Anemia with heme negative stools.  She is iron deficient.  4. Coronary artery disease with a history of bypass grafting in 1994      (on cardiac cath possible pseudoaneurysm of the posterior wall but      was not seen on 2-D echo).  5. Diabetes mellitus with hypoglycemia during hospitalization followed      by Dr. Lucianne Muss and adjustment in medications.  6. Beta blocker intolerance.  7. Hypertension, borderline blood pressure during the hospitalization      with holding of home medications currently.  8. Dyslipidemia.  9. Obesity.  10.Right bundle branch block.  11.Ventricular tachycardia, ventricular fibrillation with cardiac      catheterization responded well to cardioversion without further      complications.   DISCHARGE CONDITION:  Improved.   PROCEDURES:  Jun 06, 2006 combined left heart cath by Dr. Nicki Guadalajara  which did include the complication of V tach/V fib.  She was  cardioverted into sinus rhythm and stabilized.   DISCHARGE MEDICATIONS:  1. Aspirin 81 mg daily.  2. Vytorin 10/80 one daily.  3. Lantus insulin 10 units daily at 10 a.m.  4. Imdur isosorbide mononitrate 30 mg daily.  5. Claritin 10 mg daily.  6. Pletal 100 mg 1/2 twice a day.  7. Levothyroxine 100 mcg daily, new dose.  8. NovoLog 8 units before  breakfast and before lunch.  9. NovoLog 14 units before supper.  10.Prevacid 30 mg daily.  11.Nitroglycerin 1/150 under your tongue as needed for chest pain.  12.Nu-Iron 150 mg daily.  13.Amaryl 4 mg daily.  May begin after starting eating more regular      diet.  14.Plavix 75 mg daily.   DISCHARGE INSTRUCTIONS:  1. You will need an Event monitor.  Dr. Fredirick Maudlin office will call with      date and time.  Have blood pressure checked the same day.  2. Increase activity slowly.  3. May shower/bathe.  4. No lifting for two days.  5. No driving for two days.  6. Low sodium heart healthy diabetic diet.  7. Wash cath site with soap and water.  Call if any bleeding, swelling      or drainage.  8. Follow up with Dr. Clarene Duke in 1-2 weeks.  9. Follow up with Dr. August Saucer.  Call his office for an appointment.  10.Follow up with Dr. Lucianne Muss as instructed.  11.Stop taking  Cozaar, Demadex, fibrin and nitroglycerin patch.   HISTORY OF PRESENT ILLNESS:  [age]-year-old African American  female with known coronary disease status post bypass grafting in 1994  who presented to the emergency room Jun 05, 2006 with chest discomfort.  Symptoms started at 4 a.m. and are more intimate and lasting 5 minutes  and abating spontaneously.  She was short of breath, complained of  dizziness for about a week secondary to possible vertigo with inner ear.  No syncope.  She denied palpitations.  No orthopnea or other complaints.  She described her chest discomfort as a heaviness of the left anterior  chest to shoulder.  Her last cath was in January of 2007 revealing  patent grafts.  EKG on admission:  Sinus rhythm, first-degree AV block,  right bundle branch block.   PAST MEDICAL HISTORY:  1. As described cardiac wise.  She has had a history of transient 2:1      block, has a beta blocker intolerance.  2. Diabetic, insulin dependent.  3. Hypertension.  4. BC.  5. Dyslipidemia.  6. History of right bundle branch  block.   FAMILY HISTORY:  See H&P.   SOCIAL HISTORY:  See H&P.   REVIEW OF SYSTEMS:  See H&P.   ALLERGIES:  1. DARVON.  2. DARVOCET.  3. ULTRAM.  4. PERCOCET.  5. TOPROL.  6. SHE IS ALSO INTOLERANT TO PROTONIX.   OUTPATIENT MEDICATIONS:  Clarified with the patient:  1. Cozaar 50 mg daily which we are holding.  2. Furosemide or Demadex equivalent, holding.  3. Claritin daily.  4. Pletal.  She was supposed to take 100 twice a day, but she just did      not feel good when she took it twice a day.  She has been taking      100 once a day.  5. Plavix 75 daily.  6. Aspirin 81 daily.  7. Amaryl 4 mg daily.  8. Levoxyl 112 mcg daily.  9. __________  which was for sleep, which she is no longer taking.  10.Prevacid 30 mg daily.   PHYSICAL EXAMINATION ON DISCHARGE:  VITAL SIGNS:  Blood pressure 118/74,  pulse 95, respiratory rate 24, temperature 98.7, sats on room air 98%.  Accu-Cheks have been 106 to 147.  HEART:  Distant heart sounds.  CHEST:  Clear.  ABDOMEN:  Obese.  Positive bowel sounds.   LABORATORY DATA:  Hemoglobin 11.2 on admission, hematocrit 34, WBC 7.2,  platelets 226, neutrophils 61, leukocytes 29, monos 9, eos 1, basos 0.  Hemoglobin dropped to a low of 9.4 with a hematocrit of 28.2, but at  discharge hemoglobin 9.9, hematocrit 30.   Please note occult blood was negative for blood.  Folate level was 6.2.  Vitamin B12 381, iron is 39, TIBC 228, iron 17, UIBC 189.   D-dimer 1.43.   BNP on admission was 31, glycohemoglobin was 7.7 and TSH was 0.171.   Radiology:  Portable chest x-ray on admission:  Stable chest.  No active  disease.  CT angiogram of the chest done May 22 negative for acute PE.  Previous bypass grafting.   EKGs in the office:  Sinus rhythm, right bundle branch block,  nonspecific changes.  At [facility]:  Sinus rhythm, first-degree AV  block.  PR is 221 msec, right bundle branch block and left anterior  fascicular block.  HOSPITAL COURSE:   [name] was admitted Jun 05, 2006 after  presenting to the emergency room with chest discomfort.  She had  intermittent chest pain lasting 5 minutes, abating spontaneously  associated with shortness of breath.  She came to the emergency room and  was more stable.  History of bypass grafting with stable grafts in  January of 2007.  Patient has a history of 2:1 block and it was felt due  to Toprol.  She did develop some further 2:1 block, not on any  medications and concern for need for pacemaker, but she had no further  episodes during the hospitalization.  The patient's d-dimer was  elevated.  She underwent a CT of her chest which showed negative for PE.   Hospital consults with Dr. Lucianne Muss and Dr. August Saucer were done, one for  diabetes management and one for anemia.  She had recently had a  colonoscopy by Dr. Randa Evens on April 17, 2006 that was with  diverticulosis.  Also internal hemorrhoids were seen.  No cancer or  polyps.  Plans were to monitor her further.  Her hemoglobin stabilized.  Patient underwent cardiac catheterization by Dr. Tresa Endo, Jun 06, 2006.  The grafts were essentially stable.  She did have distal disease beyond  to insertion site in a small vessel and she also had new disease in the  saphenous vein graft to the circumflex of 70-75%, again monitored.  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  She was set up for a 2-D echo which did  not reflect the same.  EF was 50-55%.   We retained a consult with Dr. Kathlee Nations Trigt concerning the  possibility of pseudoaneurysm.  I do not have that dictated report, but  Dr. Donata Clay stated a questionable pseudoaneurysm not visualized on  echo.  Continue medical therapy and situation discussed with patient and  family.   The patient was seen and examined Jun 09, 2006 and felt ready for  discharge home.  She will follow up as an outpatient.  We will get an  Event monitor on  her at home as well.  Adjustments to insulin were made.      [name], N.P.    ______________________________  Thereasa Solo Little, M.D.    LRI/MEDQ  D:  06/09/2006  T:  06/09/2006  Job:  [id number]   cc:   Thereasa Solo. Little, M.D.  Reather Littler, M.D.  Dr. Keturah Barre, M.D.

## 2010-05-31 NOTE — Op Note (Signed)
[name], [name]           ACCOUNT NO.:  [id number]   MEDICAL RECORD NO.:  [id number]          PATIENT TYPE:  OIB   LOCATION:  6523                         FACILITY:  [facility]   PHYSICIAN:  Darlin Priestly, MD  DATE OF BIRTH:  07/27/1920   DATE OF PROCEDURE:  06/29/2006  DATE OF DISCHARGE:                               OPERATIVE REPORT   PROCEDURE:  Insertion of a Medtronic Adapta ADDR01 generator, serial  number [id number], with passive ventricular and active atrial lead.   ATTENDING:  Darlin Priestly, MD   COMPLICATIONS:  None.   INDICATIONS:  Ms. Ladouceur is an 77 year old female patient of Dr. Julieanne Manson with a history of CAD, status post bypass surgery in 1994 with  last catheterization in January 2007 revealing patent grafts.  She did  have an episode of VF during her catheterization requiring  defibrillation.  She has had symptomatic bradycardia with intermittent  second-degree heart block type 1.  Her bradycardia has limited her  adequate treatment of her CAD.  She is now referred for dual-chamber  pacer implant secondary to symptomatic bradycardia and to allow adequate  treatment of her CAD.   DESCRIPTION OF OPERATION:  After obtaining informed written consent, the  patient was brought to the cardiac catheterization lab, where her left  chest was prepped and draped in sterile fashion.  Anesthesia monitoring  established.  Lidocaine 1% was then used to anesthetize the left mid  subclavicular area.  Next, approximately a 3 cm transverse mid  infraclavicular incision was carried out and electrocautery was used to  obtain hemostasis.  Blunt dissection was used to carry this down to the  left pectoral fascia.  Next, approximately a 3 x 4 cm pocket was then  created over the left pectoral fascia and again hemostasis was obtained  with electrocautery.  The left subclavian vein was then easily entered  x2 with two retained guidewires.  A 4-0 silk suture was then  placed at  the base of the retained guidewires.  Over the first retained guidewire  a #7-French dilator sheath then easily passed into the subclavian vein  and the dilator and guidewire were removed.  Through this a passive  Medtronic lead, 52 cm, model number 4092, serial number [id number], was  then passed into the right atrium and the peel-away sheath was removed.  Over the second retained guidewire a second 7-French dilator and sheath  then passed into the left subclavian vein and the dilator and guidewire  were then removed.  Through this a 45-cm active Medtronic lead model  number [id number], serial [id number], was then placed into the right atrium  and the peel-away sheath removed.  A J curve was then placed in the  ventricular lead stylet and the ventricular lead allowed to prolapse  through the tricuspid valve and positioned in the RV apex without  difficulty.  Thresholds were then determined.  R waves were measured at  10.6 mV.  Impedance 859 ohms.  Threshold in the ventricle was 0.4 V at  0.5 msec.  Current was 0.5 mA.  Ten volts were negative for  diaphragmatic stimulation.  The preformed atrial stylet was inserted in  the atrial lead and the atrial lead was then positioned in the right  atrial appendage.  We were able to capture 2 V.  The screw was then  extended and thresholds were then determined.  P waves were measured at  2.6 V.  Impedance 598 ohms.  Threshold in the atrium was 0.9 V at 0.5  msec.  Current is 2 mA.  Ten volts are negative for diaphragmatic  stimulation in both the atrial and ventricular lead.  The leads were  sutured into place with two silk sutures per lead anchoring this to the  pectoral fascia.  The pocket was then copiously irrigated with 1%  kanamycin solution.  Again hemostasis was confirmed.  The leads were  then connected in a serial fashion to a Medtronic Adapta ADDR01  generator, serial number [id number].  Head screws were tightened and  pacing was  confirmed.  A single silk suture was then placed in the  superior aspect of the pocket.  The generating leads were then delivered  into the pocket and the header was then secured with silk suture.  The  subcutaneous layer was then closed with running 2-0 Vicryl.  The skin  was then closed using a 4-0 Vicryl.  Steri-Strips were then applied.  The patient was transferred to the recovery room in stable condition.   CONCLUSIONS:  Successful implant of a Medtronic Adapta [id number] generator,  serial number [id number], with passive ventricular and active atrial  leads.      Darlin Priestly, MD  Electronically Signed     RHM/MEDQ  D:  06/29/2006  T:  06/30/2006  Job:  [id number]   cc:   Thereasa Solo. Little, M.D.

## 2010-06-03 NOTE — Discharge Summary (Signed)
[place]  Patient:    [name], [name]                    MRN: [id number] Adm. Date:  [id number] Disc. Date: [id number] Attending:  Gwenyth Bender                           Discharge Summary  FINAL DIAGNOSES:  1. Transient cerebral ischemia, 435.9.  2. Congestive heart failure, 428.0.  3. Urinary tract infection, 599.0.  4. Klebsiella infection, 041.3.  5. Cerebral vascular disease, 437.9.  6. Aortocoronary bypass, v45.81.  7. Diabetes mellitus type 2, 250.80.  8. Hypertension, 401.9.  9. Hypothyroidism, 244.9. 10. Pure hypercholesterolemia. 11. Morbid obesity, 278.01. 12. Diabetes mellitus with neurologic manifestations, 250.62. 13. Neuropathy in diabetes, 357.2.  OPERATIONS AND PROCEDURES:  None.  HISTORY OF PRESENT ILLNESS:  Patient is a 77 year old black female with past medical history significant for multiple medical problems, i.e. coronary artery disease status post CABG x 4, history of hypertension, diabetes mellitus, history of CVA x 3, history of congestive heart failure, hypothyroidism, a transfer from [facility] in [place], [place].  According to the family, while in church the patient complained of weakness in her legs and had slow speech and slurred speech occurring with a headache and leaning toward her right side.  Denies fall or head trauma.  Patient went to [facility].  Had a CT scan of her head which showed old lacunar infarct but no evidence of new infarct.  The patient is a resident of [facility] and subsequently was transferred to [facility] for further management.  Patient presently denies any complaints.  She denies any headaches.  No slurred speech or weakness.  PAST MEDICAL HISTORY AND PHYSICAL EXAMINATION:  As per admission H&P.  HOSPITAL COURSE:  Patient admitted for further evaluation of possible TIA versus CVA versus RIND.  She had a repeat CT scan  of the head which showed evidence of an old lacunar infarct.  The patient was admitted to telemetry to monitor for possible arrhythmias.  She was started on Lovenox at 100 mg q.12h., per Dr. Sharyn Lull.  She was also placed on Nitro-Dur patch to exclude coronary spasms, and aspirin therapy as well.  Over the subsequent 24 hours, patient had no further recurrence of her neurologic symptoms.  Further evaluation was pursued including carotid Dopplers, which demonstrated no evidence for significant internal carotid artery stenosis.  She had vertebral artery flow which was antegrade.  She also underwent an MRI scan of the head. This demonstrated chronic ischemic changes without acute abnormality.  Because of her symptoms, she was seen in consultation by Dr. Sandria Manly as well.  With the above-noted findings, it was recommended that patient remain on aspirin therapy with the addition of Plavix at a dose of 75 mg p.o. q.d.  In addition to the above findings, patient was noted to have a urinary tract infection on admission.  This was treated with antibiotics with appropriate response thereafter.  Notably, her blood sugars did not fluctuate greatly during her hospital stay with maintenance of adequate diet.  She required much lower doses of the insulin than usual which she was hospitalized.  This was reviewed with patient as well during this time.  The emphasis was placed on dietary compliance as well.  Patient continued to make progress without further recurring neurologic symptoms.  It was felt that she had  a TIA versus RIND.  By November 11, patient was felt to be stable for discharge.  MEDICATIONS:  1. Aspirin 81 mg p.o. q.d.  2. Demadex 10 mg q.d.  3. Humulin 70/30 24 units subcu q.a.m.  4. Plavix 75 mg q.d.  5. Amaryl 2 mg p.o. q.a.m.  6. Cozaar 50 mg q.d.  7. Pletal 100 mg b.i.d.  8. Nitro-Dur patch 0.4 mg q.d.  9. K-Dur 20 mEq q.d. 10. Levaquin 250 mg q.d.  In addition, she will continue  on: 11. Celebrex 100 mg q.d. 12. Claritin 10 mg q.d. 13. Synthroid 75 mcg q.d.  DIET:  She will be maintained on a 4 g sodium 1800 calorie ADA diet.  SPECIAL INSTRUCTIONS:  She has been instructed to check her blood sugars three times a day.  FOLLOW-UP:  Call our office for an appointment in two weeks time. DD:  12/29/99 TD:  12/29/99 Job: [id number]

## 2010-06-03 NOTE — Cardiovascular Report (Signed)
[name], FITZHENRY           ACCOUNT NO.:  [id number]   MEDICAL RECORD NO.:  [id number]          PATIENT TYPE:  OIB   LOCATION:  2899                         FACILITY:  [facility]   PHYSICIAN:  Thereasa Solo. Little, M.D. DATE OF BIRTH:  07/27/1920   DATE OF PROCEDURE:  11/18/2003  DATE OF DISCHARGE:                              CARDIAC CATHETERIZATION   This 77 year old female has had bypass surgery in 1994 by Dr. Laneta Simmers.  She  had had some symptoms of congestive heart failure bu had been managed  medically and done quite well.  Around January of this year, she began  having episodes of chest discomfort that had an anginal quality to it.  A  Cardiolite study was performed in February of 2005 that was negative for  ischemia and showed a 50% ejection fraction.   Despite a rather unremarkable Cardiolite study, she continued to be  symptomatic and because of this, she is brought in for outpatient cardiac  catheterization.   After obtaining informed consent, the patient was prepped and draped  in the  usual sterile fashion, exposing the right groin.  Applying local anesthetic  with 1% Xylocaine, the Seldinger technique was employed and a 5 [place]  introducer sheath was placed into the right femoral artery.  Selective left  and right coronary arteriography, ventriculography in the RAO projection and  graft visualization was performed.   TOTAL CONTRAST:  80 cc.   EQUIPMENT:  5 French Judkins configuration catheters with the Awl grafts and  the internal mammary artery being evaluated with a right coronary bypass  catheter.   The right coronary artery was never visualized.   RESULTS:  1.  Hemodynamic monitoring:  Central aortic pressure was 173/94, left      ventricular pressure was 174/18 and she was given 10 mg of IV labetalol      at the termination of the procedure for lower systolic pressure for      safer removal of the arterial sheath.   1.  Ventriculography:  Ventriculography  in the RAO projection using 25 cc of      contrast at 12 cc per second revealed a posterior basilar aneurysm.  The      other segment showed normal LV systolic function, ejection fraction was      approximately 55%.  Ectopy occurred during the ventriculogram and there      was some ectopy-induced mitral regurgitation but on the first two beats,      I did not appreciate significant MR.  The left ventricular end diastolic      pressure was 20.   1.  Coronary arteriography:  #1.  Left main normal.  #2.  Circumflex 100%      occluded proximally.  #3:  LAD.  The LAD right after the septal      perforator had two sequential areas of 80% narrowing with what appeared      to be total occlusion of the vessel in its mid portion, (distal vessel      was grafted).  #4.  Right coronary artery not visualized.  #5.  Saphenous vein graft to the PDA.  The graft was free of disease.  The      PDA was free of disease and showed collateral filling of the left side      probably the OM system.  #6.  Saphenous vein graft to the OM.  The graft      itself was widely patent. The OM was a very large vessel that bifurcated      and gave collaterals that visualized the distal circumflex and a small      OM bed that came off the terminal portion of the circumflex.  #7.      Saphenous vein graft to the diagonal.  This graft was moderate in size      with a 50% area of distal eccentric narrowing.  The diagonal itself was      about 1 mm in diameter and less than 15 mm in length.  Because of this      relative small size of the terminal vessel, I did not feel that      intervening upon a 50% lesion in a vein graft was of benefit.  #8.  Left      internal mammary artery to the LAD.  This graft was widely patent.  The      LAD was widely patent and there was collateral filling to the right      coronary system.   CONCLUSION:  1.  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  2.  Patent grafts.  The diagonal graft however, has an area of 50% eccentric      distal narrowing but the vessel that it supplies is extremely small and      short and not amenable to any type of intervention to the graft because      there would be no distal run off.  Will continue her on medical therapy.      It should be pointed out that the RCA was not visualized but was clearly      grafted and had excellent flow in the PDA and collateral flow from the      LAD to the distal RCA also.   At this point, I cannot explain her symptoms from a cardiac standpoint.  She  may benefit from a GI evaluation for her chest pain.  The catheter data  correlates nicely with a Cardiolite study from February of 2005 that was  also negative for ischemia.  She will be discharged to home later today with  continuation of her current medical therapy.       ABL/MEDQ  D:  11/18/2003  T:  11/18/2003  Job:  [id number]   cc:   Minerva Areola L. August Saucer, M.D.  [address] [id number]  [place]  [place] [address]  Fax: [phone]   Catheterization laboratory

## 2010-06-03 NOTE — Consult Note (Signed)
[name], [name]           ACCOUNT NO.:  [id number]   MEDICAL RECORD NO.:  [id number]          PATIENT TYPE:  INP   LOCATION:  4707                         FACILITY:  [facility]   PHYSICIAN:  Shirley Friar, MDDATE OF BIRTH:  07/27/1920   DATE OF CONSULTATION:  04/13/2006  DATE OF DISCHARGE:                                 CONSULTATION   REQUESTING PHYSICIAN:  Dr. Willey Blade.   REASON FOR CONSULTATION:  Constipation, abdominal pain.   HISTORY OF PRESENT ILLNESS:  This is an unassigned patient seen at the  request of Dr. Willey Blade in regards to her constipation and abdominal  pain.  [name] was admitted on April 06, 2006 secondary to cough and  shortness of breath, and she has a history of diabetes, coronary artery  disease and congestive heart failure in addition to other history as  stated in chart.  She gives a chronic history of constipation where she  moves her bowels about once every four days and only moves it after four  days when she takes magnesium citrate.  During her hospitalization here,  she was found to have colonic ileus with cecal dilation up to 9.9 cm on  x-ray on April 08, 2006.  Multiple air-fluid levels were noted and  normal caliber colon on an x-ray on April 10, 2006.  On April 13, 2006,  her ileus have resolved with scattered gas in the colon and rectosigmoid  without distension and resolved mild small-bowel distension and resolved  colon distension.  She reports having a large soft and loose bowel  movement today which is her first bowel movement since Tuesday.  She  denies any rectal bleeding, and she is Hemoccult negative.  She reports  having a colonoscopy approximately five years ago by Dr. Arlyce Dice, and she  said she had one polyp on that.   PAST MEDICAL HISTORY:  1. Coronary artery disease.  2. Diabetes.  3. History of bronchitis.  4. Hypothyroidism.  5. History of chronic constipation as stated above.  6. Congestive heart failure.   PHYSICAL EXAMINATION:  VITAL SIGNS:  Temperature 99, pulse 87, blood  pressure 136/70. O2 saturation 92% on room air.  GENERAL:  Alert in no acute distress.  ABDOMEN:  Mildly distended, soft, nontender, positive bowel sounds.   LABORATORY DATA:  White blood count 6, hemoglobin 11, platelet count 152  on April 10, 2006.  Potassium 5.7, BUN 14, creatinine 1.1.  LFTs:  Total  bilirubin 1.4, ALP 76, AST 31, ALT 16.  Fecal occult blood testing  negative.   IMPRESSION:  An 77 year old black female with chronic constipation and  recent cecal dilation up to 9.9 cm on an x-ray and ileus that has since  resolved.  The patient has also been having abdominal pain in the  hospital off and on with no particular triggers, but that has since  subsided since moving her bowels.  The main question is does she have  obstipation or some type of a blockage in her colon that is an acute  change from her regular pattern.  Possibilities include ischemia versus  infectious versus obstructive process  such as a malignant process.  Despite resolution of ileus on x-ray, the patient may need to have a CAT  scan to look for these etiologies in her gut.  Will hold off on CT today  because she likely need IV contrast to completely assess her gut.  Will  check a repeat abdominal x-ray in a.m.  If her creatinine is better in  the a.m., consider a CT with oral contrast and possible IV contrast  versus a decision on starting colon prep for colonoscopy.  The patient  will need a colonoscopy either way for colon cancer screening, but CAT  scan may shed light on other etiologies in her small bowel as well as in  her proximal colon.  No evidence of acute abdomen at this time.  Of  note, her last colonoscopy was approximately 5 years ago, and she has  personal history of polyps.   PLAN:  1. Recheck abdominal x-ray in a.m.  2. Consider abdominal CT scan as stated above.  3. Colonoscopy when medically stable.      Shirley Friar, MD  Electronically Signed     VCS/MEDQ  D:  04/13/2006  T:  04/14/2006  Job:  [id number]

## 2010-06-03 NOTE — Op Note (Signed)
NAMEMAYLANI, [name]           ACCOUNT NO.:  [id number]   MEDICAL RECORD NO.:  [id number]          PATIENT TYPE:  INP   LOCATION:  4707                         FACILITY:  [facility]   PHYSICIAN:  James L. Malon Kindle., M.D.DATE OF BIRTH:  07/27/1920   DATE OF PROCEDURE:  04/17/2006  DATE OF DISCHARGE:                               OPERATIVE REPORT   PROCEDURE:  Colonoscopy.   MEDICATIONS:  Please see nurse's note.  They were not written down.   INDICATION:  The patient had abnormal x-ray showing a thickened colon,  had no clear obstruction, had constipation, change in bowel habits and  an ileus that was somewhat diffuse.  The cause of all this was not  clear.  She had a lot of colon distension that was up to nearly 10 cm  but has subsequent gone down.   DESCRIPTION OF PROCEDURE:  Procedure explained to the patient consent  obtained.  With the patient the left lateral decubitus position the  pediatric Pentax scope was inserted.  We were able to advance easily to  the cecum, ileocecal valve and appendiceal orifice seen.  Scope  withdrawn.  Scattered diverticula in the sigmoid, left colon but other  than that the colon was completely normal.  No polyps or other lesions.  There were some internal hemorrhoids seen in the rectum on retroflex  view.  The scope was withdrawn.  The patient tolerated the procedure  well.   ASSESSMENT:  1. Abnormal x-ray with colonic ileus, no obstruction seen.  2. Diverticulosis.  3. Internal hemorrhoids.   PLAN:  Will follow the patient clinically.  Go ahead and feed her and  would keep her on MiraLax to try to keep her from being constipated.           ______________________________  Llana Aliment. Malon Kindle., M.D.     Waldron Session  D:  04/17/2006  T:  04/17/2006  Job:  [id number]   cc:   Ricki Rodriguez, M.D.

## 2010-06-03 NOTE — Consult Note (Signed)
[facility]  Patient:    [name], [name]                    MRN: [id number] Proc. Date: 11/21/99 Adm. Date:  [id number] Attending:  Gwenyth Bender                          Consultation Report  DATE OF BIRTH:  July 27, 1920  PATIENTS ADDRESS:  [address] [place], [place]  [address]  REASON FOR CONSULTATION:  This 77 year old right-handed black married female was admitted in transfer from [facility].  She is seen for evaluation of generalized weakness.  HISTORY OF PRESENT ILLNESS:  This patient is 77 years of age, right-handed, and has a 22-year history of diabetes mellitus, a known history of coronary artery disease, status post myocardial infarction in 1994 and four vessel coronary artery bypass in 1994, status post left carotid endarterectomy in 1990, hypertension, and "mini strokes" in the past, for which she had a CT scan in 1999 showing evidence of bilateral small vessel ischemic strokes. Yesterday, she was traveling to go to church in [place], [place].  She did not feel well.  She did not get out of the car because of generalized weakness unassociated with a spinning sensation, nausea or vomiting.  There was no headache, but the family noted slurred speech, and she was brought to [place] after the CT scan was obtained, showing old lacunar infarctions. The patient feels much better.  She has been on aspirin on a daily basis.  MEDICATIONS:  She currently takes Nitro-Derm .4 mg q.d., Synthroid 0.75 mg q.d., Pletal 100 mg b.i.d., Demadex 20 mg p.o. b.i.d., Celebrex 200 mg q.d., baby aspirin 2 p.o. q.d., Micro-K 10 mEq q.d., Humulin-N 40 units in the morning and 8 units a night, and what I believe is most likely Amaryl, dosage unknown.  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  PHYSICAL EXAMINATION:  GENERAL:  Well-developed, pleasant black female in no acute distress.  VITAL SIGNS:  Blood pressure in the right and left arm sitting was 90/60. Heart rate was a sinus rhythm with first-degree AV block.  She had a loud left carotid bruit.  She was afebrile.  MENTAL STATUS:  She was alert and oriented x3 except that the year was 2002. She could remember two or three objects at five minutes.  She had poor spelling backwards but could spell "world" and "earth" forwards.  NEUROLOGIC:  Her cranial nerve examination revealed that she was blind OD. She could not see light.  She had a poorly reactive right pupil.  The left pupil was reactive.  She had decreased nasal vision in her left eye.  She had no seventh nerve palsy.  Hearing was intact with air conduction greater than bone conduction.  Tone was midline.  Gags were present.  Sternocleidomastoid and trapezius testing were normal.  Motor examination revealed good strength in the upper and lower extremities.  She had some atrophy of the first dorsal interosseous bilaterally.  She had decreased pinprick and vibration of the lower extremities.  She had absent knee jerks and absent ankle jerks.  Plantar responses were downgoing.  She could stand by the bed.  There was no change in her blood pressure  going from a lying to a standing position.  LABORATORY DATA: MRI of the brain showed bilateral small vessel disease, ischemic, that was old and evidence of intracranial atherosclerotic vascular disease involving the right cavernous and both middle cerebral arteries.  IMPRESSION: 1.  Transient ischemic attack (code 435.9). 2.  Old strokes, bicerebral in location (code 433.31) 3.  Diabetes (code 250.60). 4.  Diabetic neuropathy (code 357.2). 5.  Hypertension (code 796.2). 6.  Coronary artery disease (code 429.2).  PLAN:  Add Plavix to her regimen of aspirin and obtain  stroke consult and follow up with her Doppler studies of the carotids. DD:  11/21/99 TD:  11/21/99 Job: [id number]

## 2010-06-03 NOTE — Discharge Summary (Signed)
[name], [name]           ACCOUNT NO.:  [id number]   MEDICAL RECORD NO.:  [id number]          PATIENT TYPE:  INP   LOCATION:  4707                         FACILITY:  [facility]   PHYSICIAN:  [name] L. August Saucer, M.D.     DATE OF BIRTH:  07/27/1920   DATE OF ADMISSION:  04/06/2006  DATE OF DISCHARGE:  04/18/2006                               DISCHARGE SUMMARY   FINAL DIAGNOSES:  1. Asthma without status asthmaticus 493.90.  2. Acute systolic heart failure 428.21.  3. Paralytic ileus 560.1.  4. Hypo-osmolality 276.1.  5. Acute respiratory infection 465.9.  6. Constipation 564.00.  7. Hypothyroidism 244.9.  8  Congestive heart failure 428.0.  1. Old myocardial infarction 412.0.  2. Hypertension for 401.9.  3. Obesity  278.00.  4. Right bundle branch block 426.4.  5. Diverticulosis of the colon without hemorrhage 562.10.  6. Internal hemorrhoids without complication 455.0.   OPERATIONS/PROCEDURE:  1. Flexible fiberoptic colonoscopy per Dr. Randa Evens.  2. Diagnostic ultrasound of the heart per Dr. Algie Coffer.   HISTORY OF PRESENT ILLNESS:  This was one of several [facility]  admissions for this 77 year old married black female with multiple  medical problems who presented to the emergency room complaining of  increasing cough productive of yellowish-green sputum for one week's  duration.  This was associated with increasing fever to 102 as well.  The patient had been treating herself with Tylenol and Mucinex.  Her  symptoms, however, did not get better and she subsequently presented to  the emergency room.   Her past medical history and physical exam is as noted per Dr. Algie Coffer.   HOSPITAL COURSE:  The patient was admitted for further treatment of  cough which was productive with low grade fever.  It was felt that she  has had symptoms highly suggestive of influenza and bronchitis as well.  There was evidence suggestive of acute left-sided heart failure.  The  patient was  started on IV Lasix, IV Rocephin, oxygen support as well.  She was maintained on her home medications.  Over the next 24 hours, she  did have some improvement.  She had Zithromax added to her Rocephin per  Dr. Algie Coffer as well.  Over the subsequent days, she made a gradual but  steady improvement.   She was noted to have evidence for some cardiomegaly with vascular  congestion by x-ray.  A 2-D echo was subsequently done by Dr. Algie Coffer on  April 09, 2006.  She was found to have systolic and diastolic  dysfunction with left ventricular hypertrophy and aortic valvular  calcification.  She was continued on medical management as well.   Notably, she also during her stay complained of abdominal discomfort.  Abdominal film did show a distended cecum with a questionable ileus as  well.  She was noted to have significant colonic dysfunction which had  previously been documented as well.  Supportive measures was continued  thereafter.  She made slow but steady improvement thereafter.   Due to complaints of some chest pain as well as a history of coronary  artery disease, she was seen by Dr.  Little, who is her regular  cardiologist.  It was agreed that her symptoms are most consistent with  an acute bronchitis without significant anginal component.  It was felt  that clinically she was improving with the present regimen.  It was  recommended that she continue her cardiac medicines as she had before.   With continued therapy, the patient's pulmonary symptoms improved  steadily.  She, however, was noted to have ongoing problems with colonic  bloating and distension.  A followup x-ray did show evidence of an ileus  which did slowly resolve.  She was seen in consultation by Dr. Bosie Clos  of Gastroenterology.  It was noted that she had not had a recent  colonoscopy.  Once her pulmonary status was stabilized, she did undergo  a colonoscopy on April 17, 2006, per Dr. Randa Evens.  She was noted to have  some  diverticulosis with internal hemorrhoids.  No other significant  findings.  It was recommended that she be placed on MiraLax again which  she had done in the past.  Diet was advanced there as well.   The patient subsequently continued to improve.  She thereafter remained  ambulatory.  She did require a rolling walker to assist with ambulation  upon evaluation by PT.  By April 18, 2006, she was doing well enough to  return home.  Further followup as an outpatient will be pursued.   DISCHARGE MEDICATIONS:  1. Advair 250/50 one puff b.i.d.  2. Aspirin 81 mg daily.  3. Claritin 10 mg daily.  4. Cozaar 50 mg daily.  5. Glucotrol XL 5 mg daily.  6. Lantus 40 units subcu q.h.s.  7. Lasix 20 mg q.a.m.  8. MiraLax 17 gm daily.  9. Nitro-Dur 0.4 mg per hour patch daily.  10.Synthroid 112 mcg daily.  11.Aciphex 20 mg q.a.m.  12.Cymbalta 60 mg q.h.s.  13.Cilostazol 100 mg b.i.d.  14.Xopenex two puffs q.i.d. as needed.   DISCHARGE DIET:  She is being maintained on a no concentrated sweets  diet.   DISCHARGE FOLLOWUP:  She will be seen in our office in 2 weeks' time for  followup.   It is recognized that the patient has multiple medical problems.  Ongoing care will be needed.           ______________________________  Lind Guest August Saucer, M.D.     ELD/MEDQ  D:  07/11/2006  T:  07/12/2006  Job:  [id number]

## 2010-06-03 NOTE — Discharge Summary (Signed)
[name], [name]           ACCOUNT NO.:  [id number]   MEDICAL RECORD NO.:  [id number]          PATIENT TYPE:  INP   LOCATION:  2025                         FACILITY:  [facility]   PHYSICIAN:  Marcie Bal, M.D. DATE OF BIRTH:  07/27/1920   DATE OF ADMISSION:  01/24/2005  DATE OF DISCHARGE:  01/27/2005                                 DISCHARGE SUMMARY   DISCHARGE DIAGNOSES:  1.  Chest pain prolonged with hypertension status post cath on January 24, 2005 with patent grafts.  2.  A 2:1 second degree AV block, resolved.  3.  Hypertension, improved.  4.  Anemia.  5.  Diabetes mellitus.  6.  Hypothyroidism.  7.  Hyperlipidemia.  8.  Arthritis with left shoulder pain.   HISTORY OF PRESENT ILLNESS:  HOSPITAL COURSE:  This is an 77 year old lady,  patient of Dr. Clarene Duke, who presented to the emergency room with complaints  of chest pain radiating to her neck, head, shoulder, and lasting for a few  hours and on presentation to [facility], her blood pressure was  also low in the emergency room 89/19, then 78/36.  Her EKGs showed 2:1 AV  block and nonsustained VT on telemetry.   The very same day, patient underwent coronary angiography performed by Dr.  Jacinto Halim.  It showed native three-vessel coronary disease, and patent graft.  Her grafts are LIMA to the LAD, SVG to the obtuse marginal, SVG to the  diagonal 2, SVG to the PTA.  All of them are patent.  EF was 50% with some  inferior akinesis and lateral wall hypokinesis.  Left renal artery revealed  80% stenosis.   The patient tolerated the procedure well and was transferred to the unit in  stable condition.  Her AV block resolved with discontinuation of Toprol and  hypotension also resolved after we discontinued her Cozaar and Demadex.  She  was somewhat dizzy.  We checked her orthostatic blood pressures, but they  did not reveal any significant drop and on January 27, 2005, the patient was  seen by Dr. Lenise Herald  and considered to be stable for discharge home.   HOSPITAL LABORATORY:  White blood cell count 6.8, hemoglobin 9.7, hematocrit  28.7, platelet count 221.  Sodium 136, potassium 4.1, chloride 107, CO2 25,  BUN 14, creatinine 1.2, glucose 79.   Telemetry showed a sinus rhythm.  Her TSH was 1.518, lipid profile revealed  a total cholesterol of 116, triglycerides 123, HDL 36, LDL 55.   DISCHARGE INSTRUCTIONS:  A low-fat, low-salt diet.  No driving for three  days post hospital, and no lifting greater than five pounds for three days.   DISCHARGE MEDICATIONS:  1.  Aspirin 325 mg daily.  2.  Vytorin 10/80 mg daily.  3.  Synthroid 75 mcg daily.  4.  Omeprazole 20 mg daily.  5.  Lantus 42 units daily.  6.  NovoLog 24 units daily.  7.  Amitriptyline 10 mg daily.  8.  Claritin 10 mg daily.   Dr. Clarene Duke will see patient on February 09, 2005 at 10:15, and she was  instructed to discontinue Cozaar, Demodex, and Cipro.           ______________________________  Marcie Bal, M.D.     TAZ/MEDQ  D:  01/27/2005  T:  01/28/2005  Job:  [id number]   cc:   Gaspar Garbe B. Little, M.D.  Fax: [phone]

## 2010-06-03 NOTE — Cardiovascular Report (Signed)
[name], [name]           ACCOUNT NO.:  [id number]   MEDICAL RECORD NO.:  [id number]          PATIENT TYPE:  INP   LOCATION:  2922                         FACILITY:  [facility]   PHYSICIAN:  Cristy Hilts. Jacinto Halim, MD       DATE OF BIRTH:  07/27/1920   DATE OF PROCEDURE:  01/24/2005  DATE OF DISCHARGE:                              CARDIAC CATHETERIZATION   ATTENDING CARDIOLOGIST:  Dr. Julieanne Manson.   PROCEDURE PERFORMED:  1.  Left ventriculography.  2.  Selective left coronary angiography.  3.  Saphenous vein graft to left internal mammary artery.  4.  Selective right and left renal arteriography.   INDICATION:  Ms. Wenz is an 77 year old African American female with  history of hypertension, diabetes and hyperlipidemia, who has a history of  coronary artery disease and has had bypass in October of 1994.  She was  admitted through [facility] Emergency Room complaining of chest  pain which started at 11 o'clock the previous evening.  Because of vomiting,  chest discomfort and also hypotension and heart block, she had decreased  second-degree type II A-V block, she was brought to catheterization on a  semi-urgent basis, given new inferior wall EKG changes and lateral wall EKG  changes.   HEMODYNAMIC DATA:  The left ventricular pressure was 110/10 with end-  diastolic pressure of 14 mmHg.  The aortic pressures were 114/16 with a mean  of 88 mmHg.  There was no pressure gradient across the aortic valve.   ANGIOGRAPHIC DATA:  Left ventricle:  Left ventricular systolic function was  preserved and in the lower limit of normal with ejection fraction of 50%.  There is inferior akinesis and lateral wall hypokinesis.  There was no  significant mitral regurgitation.   Right coronary artery:  The right coronary artery is a dominant vessel.  It  has got severe diffuse disease.  The mid RCA has a 50%, at most 60%  stenosis.  Distally, it continues as a PLA and the PDA is occluded and  this  PDA is supplied by saphenous vein graft.   Left main:  The left main is a moderate- to large-caliber vessel.  It has  got mild calcification.   LAD:  The LAD is a moderate- to large-caliber vessel.  It gives origin to a  small diagonal 1 and then it also gives origin to a large septal perforator  and then has a 90% stenosis which are tandem lesions and is occluded in its  mid-segment.  The distal LAD is supplied by LIMA.  It also gives origin to a  small diagonal 2, which is supplied by a saphenous vein graft.   Circumflex:  The circumflex coronary artery is occluded in its proximal  segment.  The obtuse marginal 1, which is a large branch, is supplied by a  saphenous vein graft.  The distal circumflex itself is obscured by  ipsilateral vein to collaterals for the obtuse marginal vessel.   SVG to PDA:  SVG to PDA is widely patent.   SVG to OM-1:  SVG to OM-1 is widely patent.  There is  mild luminal  irregularity in its distal segment.   SVG to D-2:  SVG to diagonal 2 is widely patent.  However, the diagonal  itself is severely diffusely diseased.   LIMA to LAD:  LIMA to LAD is widely patent.   SELECTIVE RENAL ARTERIOGRAPHY:  The left renal artery has an ostial 80%  stenosis.  The right renal artery is widely patent.   IMPRESSIONS:  1.  Diffuse disease of the native vessels.  No significant change in      coronary anatomy compared to the angiography done on November 18, 2003.  2.  Left renal artery has an 80% stenosis.  3.  Ejection fraction is in the lower limit of normal with inferior akinesis      and lateral wall hypokinesis with ejection fraction of 50%.  End-      diastolic pressure is normal.   RECOMMENDATION:  The patient will be continued in observation in the TCU for  24 hours.  I suspect that the hypertension and also the second-degree A-V  block are potentially related to excessive beta blocker intake.  The patient  is legally blind and I am not sure about the  compliance with medications.  Hence, she will be observed for 24 hours and if stable, will be discharged  home; however, if she continues to have recurrent A-V block, which was  transient and is not present in the cath lab, then she will need a permanent  transvenous pacemaker implantation.   A total of 90 mL of contrast were utilized for diagnostic angiography.   TECHNIQUE OF PROCEDURE:  Under the usual sterile precautions using a 6-  French right femoral artery access, a 6-French multipurpose P2 catheter was  advanced to the ascending aorta over a 0.325 J-wire.  The catheter was  gently advanced into the left ventricle and left ventricular pressure was  monitored.  Hand contrast injection of the left ventricle was performed both  in LAO and RAO projections.  The catheter was flushed and it was then pulled  back into the ascending aorta where the pressure gradient  was monitored.  The right coronary artery was selectively engaged and angiography was  performed.  In a similar fashion, the left main coronary artery was  selectively engaged and angiography was performed.  Saphenous vein graft was  also cannulated with the same B2 catheter.  The catheter was then pulled  into the abdominal aorta and selective right and left renal arteriography  was performed.  The catheter was then pulled out of the body in the usual  fashion and a 4-French LIMA catheter was utilized to engage the left  internal mammary artery and angiography was performed.  Then the catheter  was pulled out of the body in the usual fashion.  The patient tolerated the  procedure well and no immediate complications were noted.      Cristy Hilts. Jacinto Halim, MD  Electronically Signed     JRG/MEDQ  D:  01/24/2005  T:  01/25/2005  Job:  [id number]   cc:   Thereasa Solo. Little, M.D.  Fax: [phone]   Lind Guest. August Saucer, M.D.  Fax: [phone]

## 2010-08-17 ENCOUNTER — Other Ambulatory Visit: Payer: Self-pay | Admitting: Internal Medicine

## 2010-08-17 ENCOUNTER — Ambulatory Visit
Admission: RE | Admit: 2010-08-17 | Discharge: 2010-08-17 | Disposition: A | Discharge status: Home / Self Care | Payer: Self-pay | Source: Ambulatory Visit | Attending: Internal Medicine | Admitting: Internal Medicine

## 2010-10-07 LAB — POCT CARDIAC MARKERS
Myoglobin, poc: 165
Operator id: 151321

## 2010-10-07 LAB — URINE CULTURE

## 2010-10-07 LAB — URINALYSIS, ROUTINE W REFLEX MICROSCOPIC
Glucose, UA: NEGATIVE
Hgb urine dipstick: NEGATIVE
Specific Gravity, Urine: 1.017
Urobilinogen, UA: 1
pH: 5.5

## 2010-10-07 LAB — DIFFERENTIAL
Basophils Absolute: 0
Basophils Relative: 0
Monocytes Absolute: 0.3

## 2010-10-07 LAB — CBC
MCHC: 33.1
MCV: 86
Platelets: 212
WBC: 9.2

## 2010-10-07 LAB — BASIC METABOLIC PANEL
BUN: 22
Chloride: 106
Creatinine, Ser: 1.39 — ABNORMAL HIGH

## 2010-10-07 LAB — URINE MICROSCOPIC-ADD ON

## 2010-10-07 LAB — LIPASE, BLOOD: Lipase: 13

## 2010-10-14 LAB — CBC
HCT: 31.1 — ABNORMAL LOW
HCT: 31.3 — ABNORMAL LOW
HCT: 35.3 — ABNORMAL LOW
Hemoglobin: 10.3 — ABNORMAL LOW
Hemoglobin: 10.7 — ABNORMAL LOW
Hemoglobin: 11.7 — ABNORMAL LOW
Hemoglobin: 9.7 — ABNORMAL LOW
MCHC: 32.4
MCV: 88
MCV: 88.6
MCV: 89.3
Platelets: 169
RBC: 3.35 — ABNORMAL LOW
RBC: 3.52 — ABNORMAL LOW
RDW: 14.6
RDW: 14.7
WBC: 6.1
WBC: 6.2
WBC: 6.9
WBC: 7.1

## 2010-10-14 LAB — FOLATE: Folate: 5.2

## 2010-10-14 LAB — IRON AND TIBC
Saturation Ratios: 20
TIBC: 288
UIBC: 231

## 2010-10-14 LAB — FOLATE RBC: RBC Folate: 157 — ABNORMAL LOW

## 2010-10-14 LAB — BASIC METABOLIC PANEL
BUN: 17
CO2: 21
Calcium: 8.7
Chloride: 109
GFR calc non Af Amer: 39 — ABNORMAL LOW
Glucose, Bld: 182 — ABNORMAL HIGH
Glucose, Bld: 96
Potassium: 4.3
Sodium: 137
Sodium: 138

## 2010-10-14 LAB — URINALYSIS, ROUTINE W REFLEX MICROSCOPIC
Bilirubin Urine: NEGATIVE
Nitrite: NEGATIVE
Specific Gravity, Urine: 1.018
Urobilinogen, UA: 1

## 2010-10-14 LAB — CARDIAC PANEL(CRET KIN+CKTOT+MB+TROPI)
CK, MB: 0.9
CK, MB: 1
Relative Index: 0.9
Total CK: 117
Troponin I: 0.01
Troponin I: 0.01

## 2010-10-14 LAB — DIFFERENTIAL
Basophils Absolute: 0
Basophils Relative: 0
Eosinophils Relative: 1
Monocytes Absolute: 0.2

## 2010-10-14 LAB — LIPID PANEL
Cholesterol: 148
HDL: 37 — ABNORMAL LOW
Triglycerides: 103

## 2010-10-14 LAB — COMPREHENSIVE METABOLIC PANEL
AST: 22
Albumin: 3.7
Alkaline Phosphatase: 103
Chloride: 107
GFR calc Af Amer: 37 — ABNORMAL LOW
Potassium: 4
Total Bilirubin: 1

## 2010-10-14 LAB — CK TOTAL AND CKMB (NOT AT ARMC): Relative Index: 0.7

## 2010-10-14 LAB — TISSUE TRANSGLUTAMINASE, IGA: Tissue Transglutaminase Ab, IgA: 0.6 U/mL (ref <7)

## 2010-10-14 LAB — URINE CULTURE

## 2010-10-14 LAB — RETICULOCYTES: Retic Count, Absolute: 39.6

## 2010-10-14 LAB — OCCULT BLOOD X 1 CARD TO LAB, STOOL: Fecal Occult Bld: POSITIVE

## 2010-10-14 LAB — B-NATRIURETIC PEPTIDE (CONVERTED LAB)
Pro B Natriuretic peptide (BNP): 199 — ABNORMAL HIGH
Pro B Natriuretic peptide (BNP): 76

## 2010-10-14 LAB — TSH: TSH: 0.325 — ABNORMAL LOW

## 2011-01-17 DIAGNOSIS — Z9289 Personal history of other medical treatment: Secondary | ICD-10-CM

## 2011-01-17 HISTORY — DX: Personal history of other medical treatment: Z92.89

## 2011-01-25 ENCOUNTER — Other Ambulatory Visit: Payer: Self-pay | Admitting: Internal Medicine

## 2011-01-25 DIAGNOSIS — N644 Mastodynia: Secondary | ICD-10-CM

## 2011-02-01 ENCOUNTER — Ambulatory Visit
Admission: RE | Admit: 2011-02-01 | Discharge: 2011-02-01 | Disposition: A | Discharge status: Home / Self Care | Payer: Medicare Other | Source: Ambulatory Visit | Attending: Internal Medicine | Admitting: Internal Medicine

## 2011-02-01 DIAGNOSIS — N644 Mastodynia: Secondary | ICD-10-CM

## 2011-02-01 LAB — HM MAMMOGRAPHY: HM Mammogram: NEGATIVE

## 2011-03-14 ENCOUNTER — Emergency Department (HOSPITAL_COMMUNITY): Payer: Medicare Other

## 2011-03-14 ENCOUNTER — Emergency Department (HOSPITAL_COMMUNITY)
Admission: EM | Admit: 2011-03-14 | Discharge: 2011-03-14 | Disposition: A | Discharge status: Home / Self Care | Payer: Medicare Other | Attending: Emergency Medicine | Admitting: Emergency Medicine

## 2011-03-14 ENCOUNTER — Encounter (HOSPITAL_COMMUNITY): Payer: Self-pay | Admitting: Emergency Medicine

## 2011-03-14 DIAGNOSIS — G319 Degenerative disease of nervous system, unspecified: Secondary | ICD-10-CM | POA: Insufficient documentation

## 2011-03-14 DIAGNOSIS — R42 Dizziness and giddiness: Secondary | ICD-10-CM | POA: Insufficient documentation

## 2011-03-14 DIAGNOSIS — I2510 Atherosclerotic heart disease of native coronary artery without angina pectoris: Secondary | ICD-10-CM | POA: Insufficient documentation

## 2011-03-14 DIAGNOSIS — I10 Essential (primary) hypertension: Secondary | ICD-10-CM | POA: Insufficient documentation

## 2011-03-14 DIAGNOSIS — R269 Unspecified abnormalities of gait and mobility: Secondary | ICD-10-CM | POA: Insufficient documentation

## 2011-03-14 DIAGNOSIS — I252 Old myocardial infarction: Secondary | ICD-10-CM | POA: Insufficient documentation

## 2011-03-14 DIAGNOSIS — R799 Abnormal finding of blood chemistry, unspecified: Secondary | ICD-10-CM | POA: Insufficient documentation

## 2011-03-14 DIAGNOSIS — E119 Type 2 diabetes mellitus without complications: Secondary | ICD-10-CM | POA: Insufficient documentation

## 2011-03-14 HISTORY — DX: Atherosclerotic heart disease of native coronary artery without angina pectoris: I25.10

## 2011-03-14 LAB — DIFFERENTIAL
Basophils Absolute: 0 10*3/uL (ref 0.0–0.1)
Lymphocytes Relative: 32 % (ref 12–46)
Lymphs Abs: 1.9 10*3/uL (ref 0.7–4.0)
Monocytes Absolute: 0.5 10*3/uL (ref 0.1–1.0)
Neutro Abs: 3.4 10*3/uL (ref 1.7–7.7)

## 2011-03-14 LAB — CBC
HCT: 33.7 % — ABNORMAL LOW (ref 36.0–46.0)
Hemoglobin: 10.7 g/dL — ABNORMAL LOW (ref 12.0–15.0)
RBC: 3.83 MIL/uL — ABNORMAL LOW (ref 3.87–5.11)
RDW: 13.9 % (ref 11.5–15.5)
WBC: 5.9 10*3/uL (ref 4.0–10.5)

## 2011-03-14 LAB — COMPREHENSIVE METABOLIC PANEL
ALT: 6 U/L (ref 0–35)
AST: 15 U/L (ref 0–37)
CO2: 26 mEq/L (ref 19–32)
Chloride: 102 mEq/L (ref 96–112)
Creatinine, Ser: 2.05 mg/dL — ABNORMAL HIGH (ref 0.50–1.10)
GFR calc non Af Amer: 20 mL/min — ABNORMAL LOW (ref >90)
Glucose, Bld: 120 mg/dL — ABNORMAL HIGH (ref 70–99)
Total Bilirubin: 0.4 mg/dL (ref 0.3–1.2)

## 2011-03-14 LAB — URINE MICROSCOPIC-ADD ON

## 2011-03-14 LAB — URINALYSIS, ROUTINE W REFLEX MICROSCOPIC
Glucose, UA: NEGATIVE mg/dL
Hgb urine dipstick: NEGATIVE
Ketones, ur: NEGATIVE mg/dL
Protein, ur: NEGATIVE mg/dL

## 2011-03-14 LAB — APTT: aPTT: 32 seconds (ref 24–37)

## 2011-03-14 NOTE — ED Notes (Signed)
MD to bedside. Pt describes weakness and dizziness and trouble with "legs giving out". Pt noted alert and oriented. Resp are unlabored. Skin is warm and dry. Noted in paced rhythm. VSS. SR up.

## 2011-03-14 NOTE — ED Notes (Signed)
Pt refused to ambulate.  Pt states she does not walk at home except from bed to toilet.  States daughter has to hold her up.

## 2011-03-14 NOTE — ED Notes (Signed)
Pt. Removed from bedpan. Pt's clothing and bed linen noted to be very wet. Pt's wet clothing removed, pt's bed linen changed, pt. Placed into gown and covered w/ warm blankets. Pt. Repositioned in bed for comfort. Pt. Requesting meal. Nurse notified of food request. No other needs voiced.

## 2011-03-14 NOTE — Discharge Instructions (Signed)

## 2011-03-14 NOTE — ED Notes (Signed)
Pt via ems with c/o dizziness x two weeks and weakness intermittently x two months.

## 2011-03-14 NOTE — ED Notes (Signed)
Patient transported to CT 

## 2011-03-14 NOTE — ED Provider Notes (Signed)
History     CSN: [id number]  Arrival date & time 03/14/11  1800   First MD Initiated Contact with Patient 03/14/11 1801      No chief complaint on file.   HPI Patient states she's been having trouble with dizziness for at least the last 2 weeks. She also has been having trouble with her right leg giving out on her but that has been.  Patient states when the dizziness started she called her primary doctor and was given a prescription for meclizine. It has not helped in fact she thinks her symptoms are getting worse. She notices whenever she tries to get up move her head didn't get worse. She feels like she is falling off towards the right. She denies any trouble with her speech. She denies any headache. Past Medical History  Diagnosis Date  . Diabetes mellitus   . Hypertension   . MI (myocardial infarction)   . Coronary artery disease     Past Surgical History  Procedure Date  . Pacemaker insertion   . Coronary artery bypass graft     No family history on file.  History  Substance Use Topics  . Smoking status: Not on file  . Smokeless tobacco: Not on file  . Alcohol Use:     OB History    Grav Para Term Preterm Abortions TAB SAB Ect Mult Living                  Review of Systems  HENT: Negative for neck pain.   Respiratory: Negative for chest tightness.   Cardiovascular: Negative for chest pain.  Musculoskeletal: Positive for gait problem.  Neurological: Negative for syncope, speech difficulty and numbness.  All other systems reviewed and are negative.    Allergies  Darvon and Percocet  Home Medications  No current outpatient prescriptions on file.  BP 136/71  Pulse 73  Temp(Src) 98.2 F (36.8 C) (Oral)  Resp 16  SpO2 100%  Physical Exam  Nursing note and vitals reviewed. Constitutional: She is oriented to person, place, and time. She appears well-developed and well-nourished. No distress.  HENT:  Head: Normocephalic and atraumatic.  Right Ear:  External ear normal.  Left Ear: External ear normal.  Mouth/Throat: Oropharynx is clear and moist.  Eyes: Conjunctivae are normal. Right eye exhibits no discharge. Left eye exhibits no discharge. No scleral icterus.  Neck: Neck supple. No tracheal deviation present.  Cardiovascular: Normal rate, regular rhythm and intact distal pulses.   Pulmonary/Chest: Effort normal and breath sounds normal. No stridor. No respiratory distress. She has no wheezes. She has no rales.  Abdominal: Soft. Bowel sounds are normal. She exhibits no distension. There is no tenderness. There is no rebound and no guarding.  Musculoskeletal: She exhibits no edema and no tenderness.  Neurological: She is alert and oriented to person, place, and time. No cranial nerve deficit ( no gross defecits noted) or sensory deficit. She exhibits normal muscle tone. She displays no seizure activity. Gait abnormal.       No pronator drift bilateral upper extrem, able to hold the legs off bed for 5 seconds, unable to lift the right leg completely off the bed sensation intact in all extremities, no visual field cuts, no left or right sided neglect  Skin: Skin is warm and dry. No rash noted.  Psychiatric: She has a normal mood and affect.    ED Course  Procedures (including critical care time)  Labs Reviewed  CBC - Abnormal; Notable for  the following:    RBC 3.83 (*)    Hemoglobin 10.7 (*)    HCT 33.7 (*)    All other components within normal limits  COMPREHENSIVE METABOLIC PANEL - Abnormal; Notable for the following:    Glucose, Bld 120 (*)    BUN 29 (*)    Creatinine, Ser 2.05 (*)    Albumin 3.3 (*)    GFR calc non Af Amer 20 (*)    GFR calc Af Amer 24 (*)    All other components within normal limits  GLUCOSE, CAPILLARY - Abnormal; Notable for the following:    Glucose-Capillary 110 (*)    All other components within normal limits  PROTIME-INR  APTT  DIFFERENTIAL  TROPONIN I  URINALYSIS, ROUTINE W REFLEX MICROSCOPIC    Ct Head Wo Contrast  03/14/2011  *RADIOLOGY REPORT*  Clinical Data: Dizziness.  Lower extremity weakness.  CT HEAD WITHOUT CONTRAST  Technique:  Contiguous axial images were obtained from the base of the skull through the vertex without contrast.  Comparison: 06/05/2009  Findings: There is no evidence of intracranial hemorrhage, brain edema or other signs of acute infarction.  There is no evidence of intracranial mass lesion or mass effect.  No abnormal extra-axial fluid collections are identified.  Mild to moderate diffuse cerebral atrophy and chronic small vessel disease are stable in appearance.  Ventricles are stable in size. Old left basal ganglia lacunar infarct is again demonstrated.  No skull abnormality identified.  IMPRESSION:  1.  No acute intracranial abnormality. 2.  Stable cerebral atrophy, chronic small vessel disease, and old left basal ganglia lacunar infarct.  Original Report Authenticated By: Danae Orleans, M.D.     1. Dizziness       MDM  We attempted to ambulate the patient today  but she states she does not walk at home except from going from the bed to the toilet. However at this time I overall doubt stroke. Her symptoms are more suggestive of vertigo. Her CT scan does not show any signs of acute abnormality. Did explain to the family it is possible she could have a stroke but with a pacemaker we could not do an MRI to evaluate further. At this point the family and patient are comfortable with outpatient followup. I told her that she could discontinue meclizine however it was not making her feel any better.        Celene Kras, MD 03/14/11 2021

## 2011-03-14 NOTE — ED Notes (Signed)
Pt started meclizine last week and reports increased lethargy and dizziness with the medication.

## 2011-04-23 ENCOUNTER — Ambulatory Visit (HOSPITAL_BASED_OUTPATIENT_CLINIC_OR_DEPARTMENT_OTHER): Payer: Medicare Other | Attending: Internal Medicine | Admitting: Radiology

## 2011-04-23 VITALS — Ht 64.0 in | Wt 223.0 lb

## 2011-04-23 DIAGNOSIS — G4733 Obstructive sleep apnea (adult) (pediatric): Secondary | ICD-10-CM

## 2011-04-23 DIAGNOSIS — R0989 Other specified symptoms and signs involving the circulatory and respiratory systems: Secondary | ICD-10-CM | POA: Insufficient documentation

## 2011-04-23 DIAGNOSIS — R0609 Other forms of dyspnea: Secondary | ICD-10-CM | POA: Insufficient documentation

## 2011-04-23 DIAGNOSIS — G4710 Hypersomnia, unspecified: Secondary | ICD-10-CM | POA: Insufficient documentation

## 2011-04-29 DIAGNOSIS — G4730 Sleep apnea, unspecified: Secondary | ICD-10-CM

## 2011-04-29 DIAGNOSIS — G4710 Hypersomnia, unspecified: Secondary | ICD-10-CM

## 2011-04-29 DIAGNOSIS — R0989 Other specified symptoms and signs involving the circulatory and respiratory systems: Secondary | ICD-10-CM

## 2011-04-29 DIAGNOSIS — R0609 Other forms of dyspnea: Secondary | ICD-10-CM

## 2011-04-29 NOTE — Procedures (Signed)
NAME:  [name], [name]           ACCOUNT NO.:  [id number]  MEDICAL RECORD NO.:  [id number]          PATIENT TYPE:  OUT  LOCATION:  SLEEP CENTER                 FACILITY:  [facility]  PHYSICIAN:  Anneka Studer D. Maple Hudson, MD, FCCP, FACPDATE OF BIRTH:  07/27/1922  DATE OF STUDY:  04/23/2011                           NOCTURNAL POLYSOMNOGRAM  REFERRING PHYSICIAN:  [name] L. August Saucer, M.D.  REFERRING PHYSICIAN:  [name] L. August Saucer, MD  INDICATION FOR STUDY:  Hypersomnia with sleep apnea.  EPWORTH SLEEPINESS SCORE:  15/24.  BMI 38.3, weight 223 pounds, height 64 inches, neck 16 inches.  MEDICATIONS:  Home medications are charted and reviewed.  SLEEP ARCHITECTURE:  Total sleep time 368 minutes with sleep efficiency 90.6%.  Stage I was 3.9%, stage II 84.4%, stage III absent, REM 11.7% of total sleep time.  Sleep latency 8.5 minutes, REM latency 139 minutes, awake after sleep onset 29.5 minutes, arousal index 8.5.  Bedtime medication:  None.  RESPIRATORY DATA:  Apnea-hypopnea index (AHI) 4.1 per hour.  A total of 25 events was scored including 10 obstructive apneas and 15 hypopneas. Events were associated with non-supine sleep position and REM.  REM AHI 16.7 per hour.  There were insufficient numbers of respiratory events to qualify for split protocol CPAP titration on this study night.  OXYGEN DATA:  Mild snoring with oxygen desaturation to a nadir of 87% and mean oxygen saturation through the study of 96.4% on room air.  CARDIAC DATA:  Paced rhythm at 60 beats per minute.  MOVEMENT-PARASOMNIA:  No significant movement disturbance.  No bathroom trips.  IMPRESSIONS-RECOMMENDATION: 1. Occasional respiratory events with sleep disturbance, within normal     limits.  AHI 4.1 per hour (the normal range for adults is from 0-5     events per hour).  Mild snoring with oxygen desaturation to a nadir     of 87% and a mean oxygen saturation through the study of 96.4% on     room air. 2. The patient had  indicated a complaint of frequent jerks in sleep.     No significant movement disturbance was recorded on the study     night.     Carson Bogden D. Maple Hudson, MD, [facility], FACP Diplomate, American Board of Sleep Medicine    CDY/MEDQ  D:  04/29/2011 11:11:50  T:  04/29/2011 12:54:22  Job:  [id number]

## 2011-06-14 ENCOUNTER — Other Ambulatory Visit: Payer: Self-pay | Admitting: Internal Medicine

## 2011-06-16 ENCOUNTER — Other Ambulatory Visit: Payer: Medicare Other

## 2011-06-16 ENCOUNTER — Ambulatory Visit
Admission: RE | Admit: 2011-06-16 | Discharge: 2011-06-16 | Disposition: A | Discharge status: Home / Self Care | Payer: Medicare Other | Source: Ambulatory Visit | Attending: Internal Medicine | Admitting: Internal Medicine

## 2011-09-12 ENCOUNTER — Emergency Department (HOSPITAL_COMMUNITY)
Admission: EM | Admit: 2011-09-12 | Discharge: 2011-09-13 | Disposition: A | Discharge status: Home / Self Care | Payer: Medicare Other | Attending: Emergency Medicine | Admitting: Emergency Medicine

## 2011-09-12 ENCOUNTER — Encounter (HOSPITAL_COMMUNITY): Payer: Self-pay | Admitting: Emergency Medicine

## 2011-09-12 ENCOUNTER — Emergency Department (HOSPITAL_COMMUNITY): Payer: Medicare Other

## 2011-09-12 DIAGNOSIS — Z950 Presence of cardiac pacemaker: Secondary | ICD-10-CM | POA: Insufficient documentation

## 2011-09-12 DIAGNOSIS — E119 Type 2 diabetes mellitus without complications: Secondary | ICD-10-CM | POA: Insufficient documentation

## 2011-09-12 DIAGNOSIS — Z794 Long term (current) use of insulin: Secondary | ICD-10-CM | POA: Insufficient documentation

## 2011-09-12 DIAGNOSIS — T887XXA Unspecified adverse effect of drug or medicament, initial encounter: Secondary | ICD-10-CM

## 2011-09-12 DIAGNOSIS — I10 Essential (primary) hypertension: Secondary | ICD-10-CM | POA: Insufficient documentation

## 2011-09-12 DIAGNOSIS — Z951 Presence of aortocoronary bypass graft: Secondary | ICD-10-CM | POA: Insufficient documentation

## 2011-09-12 DIAGNOSIS — R079 Chest pain, unspecified: Secondary | ICD-10-CM | POA: Insufficient documentation

## 2011-09-12 DIAGNOSIS — I252 Old myocardial infarction: Secondary | ICD-10-CM | POA: Insufficient documentation

## 2011-09-12 DIAGNOSIS — I2510 Atherosclerotic heart disease of native coronary artery without angina pectoris: Secondary | ICD-10-CM | POA: Insufficient documentation

## 2011-09-12 DIAGNOSIS — K5900 Constipation, unspecified: Secondary | ICD-10-CM | POA: Insufficient documentation

## 2011-09-12 DIAGNOSIS — Z79899 Other long term (current) drug therapy: Secondary | ICD-10-CM | POA: Insufficient documentation

## 2011-09-12 DIAGNOSIS — R110 Nausea: Secondary | ICD-10-CM | POA: Insufficient documentation

## 2011-09-12 DIAGNOSIS — R0602 Shortness of breath: Secondary | ICD-10-CM | POA: Insufficient documentation

## 2011-09-12 LAB — BASIC METABOLIC PANEL
CO2: 24 mEq/L (ref 19–32)
Calcium: 9.7 mg/dL (ref 8.4–10.5)
Chloride: 102 mEq/L (ref 96–112)
Sodium: 138 mEq/L (ref 135–145)

## 2011-09-12 LAB — PROTIME-INR
INR: 1.1 (ref 0.00–1.49)
Prothrombin Time: 14.4 seconds (ref 11.6–15.2)

## 2011-09-12 LAB — CBC
Platelets: 285 10*3/uL (ref 150–400)
RBC: 3.34 MIL/uL — ABNORMAL LOW (ref 3.87–5.11)
WBC: 7.8 10*3/uL (ref 4.0–10.5)

## 2011-09-12 LAB — POCT I-STAT TROPONIN I

## 2011-09-12 MED ORDER — POLYETHYLENE GLYCOL 3350 17 GM/SCOOP PO POWD: 17.0000 g | Freq: Two times a day (BID) | ORAL | Status: AC

## 2011-09-12 NOTE — ED Notes (Signed)
Denies chest pain, complains of shortness of breath at times.  Family at bedside

## 2011-09-12 NOTE — ED Notes (Signed)
The patient was able to ambulate slowly in her room with staff assistance.  Results reported to MD.

## 2011-09-12 NOTE — ED Provider Notes (Signed)
History     CSN: [id number]  Arrival date & time 09/12/11  1612   First MD Initiated Contact with Patient 09/12/11 2140      Chief Complaint  Patient presents with  . Chest Pain  . Shortness of Breath  . Nausea    (Consider location/radiation/quality/duration/timing/severity/associated sxs/prior treatment) HPI Comments: Pt is an 78 yo woman who had taken citrate of magnesia because of constipation around 3:30 P.M.  She had a bowel movement, then had vomiting, had profuse sweating, felt funny in her chest, and her head felt real hazy.  She called her internist, Willey Blade, M.D., who advised ED evaluation.  She came to [facility] ED by private car because the last time she had come to the ED she came by ambulance and received a bill for $500 for ambulance service.  She had waited in the ED waiting room for several hours, and most of her symptoms had subsided.  Patient is a 78 y.o. female presenting with chest pain and shortness of breath.  Chest Pain The chest pain began 3 - 5 hours ago. Episode Length: A brief episode, lasting approximately 30 minutes. Chest pain occurs rarely. The chest pain is improving. Associated with: Taking a strong laxative. At its most intense, the pain is at 7/10. The pain is currently at 0/10. The severity of the pain is moderate. Quality: She "felt funny" in her chest. The pain does not radiate. Exacerbated by: Nothing. Primary symptoms include shortness of breath. Pertinent negatives for primary symptoms include no fever. Primary symptoms comment: Profuse sweating.  Associated symptoms include diaphoresis and weakness. She tried nothing for the symptoms. Risk factors include being elderly, lack of exercise, sedentary lifestyle and post-menopausal.  Her past medical history is significant for arrhythmia, CAD, diabetes, hypertension and MI.  Procedure history is positive for cardiac catheterization.    Shortness of Breath  Associated symptoms include chest pain  and shortness of breath. Pertinent negatives include no fever.    Past Medical History  Diagnosis Date  . Diabetes mellitus   . Hypertension   . MI (myocardial infarction)   . Coronary artery disease     Past Surgical History  Procedure Date  . Pacemaker insertion   . Coronary artery bypass graft     History reviewed. No pertinent family history.  History  Substance Use Topics  . Smoking status: Not on file  . Smokeless tobacco: Not on file  . Alcohol Use:     OB History    Grav Para Term Preterm Abortions TAB SAB Ect Mult Living                  Review of Systems  Constitutional: Positive for diaphoresis. Negative for fever and chills.  HENT: Negative.   Eyes: Negative.   Respiratory: Positive for shortness of breath.   Cardiovascular: Positive for chest pain.  Gastrointestinal: Positive for constipation.  Genitourinary: Negative.   Musculoskeletal: Negative.   Skin:       Profuse sweating.  Neurological: Positive for weakness.  Psychiatric/Behavioral: Negative.     Allergies  Darvon; Other; Percocet; and Percodan  Home Medications   Current Outpatient Rx  Name Route Sig Dispense Refill  . ASPIRIN EC 81 MG PO TBEC Oral Take 81 mg by mouth daily.    Marland Kitchen CILOSTAZOL 100 MG PO TABS Oral Take 100 mg by mouth 2 (two) times daily.    Marland Kitchen CLOPIDOGREL BISULFATE 75 MG PO TABS Oral Take 75 mg by mouth daily.    Marland Kitchen  GABAPENTIN 100 MG PO CAPS Oral Take 100 mg by mouth every morning.    . INSULIN ASPART 100 UNIT/ML [place] SOLN Subcutaneous Inject 8 Units into the skin 3 times/day as needed-between meals & bedtime. If sugar is 200 or above    . INSULIN GLARGINE 100 UNIT/ML [place] SOLN Subcutaneous Inject 8 Units into the skin every morning.    Marland Kitchen LEVOTHYROXINE SODIUM 75 MCG PO TABS Oral Take 75 mcg by mouth daily.    Marland Kitchen LOSARTAN POTASSIUM 50 MG PO TABS Oral Take 50 mg by mouth daily.    Marland Kitchen METOPROLOL TARTRATE 50 MG PO TABS Oral Take 50 mg by mouth 2 (two) times daily.    Marland Kitchen OMEPRAZOLE  20 MG PO CPDR Oral Take 20 mg by mouth daily.    Marland Kitchen POTASSIUM CHLORIDE CRYS ER 20 MEQ PO TBCR Oral Take 20 mEq by mouth daily.    Marland Kitchen ROSUVASTATIN CALCIUM 20 MG PO TABS Oral Take 10 mg by mouth daily.    . TORSEMIDE 20 MG PO TABS Oral Take 20 mg by mouth daily.    Marland Kitchen VITAMIN D (ERGOCALCIFEROL) 50000 UNITS PO CAPS Oral Take 50,000 Units by mouth every 7 (seven) days. Mondays      BP 98/47  Pulse 68  Temp 98 F (36.7 C) (Oral)  Resp 18  SpO2 100%  Physical Exam  Nursing note and vitals reviewed. Constitutional: She is oriented to person, place, and time.       Pleasant elderly lady, no distress, is blind.  HENT:  Head: Normocephalic and atraumatic.  Right Ear: External ear normal.  Left Ear: External ear normal.       Has dentures upper and lower.  Eyes: Conjunctivae and EOM are normal. Pupils are equal, round, and reactive to light.  Neck: Normal range of motion. Neck supple.  Cardiovascular: Normal rate, regular rhythm and normal heart sounds.   Pulmonary/Chest: Effort normal and breath sounds normal.  Abdominal: Soft. Bowel sounds are normal. She exhibits no distension. There is tenderness. There is no rebound.  Musculoskeletal: Normal range of motion. She exhibits no edema and no tenderness.  Neurological: She is alert and oriented to person, place, and time.       No sensory or motor deficit.  Skin: Skin is warm and dry.  Psychiatric: She has a normal mood and affect. Her behavior is normal.    ED Course  Procedures (including critical care time)  Labs Reviewed  CBC - Abnormal; Notable for the following:    RBC 3.34 (*)     Hemoglobin 8.0 (*)     HCT 26.0 (*)     MCV 77.8 (*)     MCH 24.0 (*)     All other components within normal limits  BASIC METABOLIC PANEL - Abnormal; Notable for the following:    Glucose, Bld 174 (*)     Creatinine, Ser 1.63 (*)     GFR calc non Af Amer 27 (*)     GFR calc Af Amer 31 (*)     All other components within normal limits    PROTIME-INR  POCT I-STAT TROPONIN I   Dg Chest 2 View  09/12/2011  *RADIOLOGY REPORT*  Clinical Data: Chest pain, shortness of breath  CHEST - 2 VIEW  Comparison: 06/05/2009  Findings: Lungs are essentially clear.  No focal consolidation. No pleural effusion or pneumothorax.  The heart is top normal in size. Postsurgical changes related to prior CABG.  Left subclavian pacemaker.  Degenerative  changes of the visualized thoracolumbar spine.  IMPRESSION: No evidence of acute cardiopulmonary disease.   Original Report Authenticated By: Charline Bills, M.D.    10:39 PM  Date: 09/12/2011  Rate: 82  Rhythm: Electronically paced rhythm.    QRS Axis: normal  Intervals: normal  ST/T Wave abnormalities: normal  Conduction Disutrbances:left bundle branch block  Narrative Interpretation: Abnormal EKG  Old EKG Reviewed: unchanged  Lab workup showed anemia.  She has an upcoming appointment with Dr. August Saucer, her internist, who can check on this.  She has recovered from her episode of weakness, sweating, chest feeling funny, probably caused by her strong laxative.  I advised her to stop using citrate of magnesia, and instead take Miralax twice daily for constipation.  1. Non-dose-related adverse reaction to medication   2. Constipation   3. Diabetes mellitus           Carleene Cooper III, MD 09/13/11 1145

## 2011-09-12 NOTE — ED Notes (Signed)
Patient ambulatory in the room with 3 staff assist.  Patient states that she is weak and sometimes walks at home with a walker.

## 2011-09-12 NOTE — ED Notes (Signed)
Pt c/o CP today and then had some SOB and N/V; pt c/o generalized weakness

## 2011-09-13 ENCOUNTER — Encounter (HOSPITAL_COMMUNITY): Payer: Self-pay | Admitting: Emergency Medicine

## 2011-09-13 NOTE — ED Notes (Signed)
The patient is AOx4 and comfortable with her discharge instructions.  Her daughter is driving her home. 

## 2011-09-15 ENCOUNTER — Non-Acute Institutional Stay (HOSPITAL_COMMUNITY)
Admission: AD | Admit: 2011-09-15 | Discharge: 2011-09-15 | Disposition: A | Discharge status: Home / Self Care | Payer: Medicare Other | Source: Ambulatory Visit | Attending: Internal Medicine | Admitting: Internal Medicine

## 2011-09-15 DIAGNOSIS — I509 Heart failure, unspecified: Secondary | ICD-10-CM | POA: Insufficient documentation

## 2011-09-15 DIAGNOSIS — N189 Chronic kidney disease, unspecified: Secondary | ICD-10-CM | POA: Insufficient documentation

## 2011-09-15 DIAGNOSIS — D638 Anemia in other chronic diseases classified elsewhere: Secondary | ICD-10-CM | POA: Insufficient documentation

## 2011-09-15 DIAGNOSIS — D649 Anemia, unspecified: Secondary | ICD-10-CM | POA: Insufficient documentation

## 2011-09-15 DIAGNOSIS — E119 Type 2 diabetes mellitus without complications: Secondary | ICD-10-CM | POA: Insufficient documentation

## 2011-09-18 ENCOUNTER — Encounter (HOSPITAL_COMMUNITY): Payer: Self-pay | Admitting: Hematology

## 2011-09-18 ENCOUNTER — Non-Acute Institutional Stay (HOSPITAL_COMMUNITY)
Admission: AD | Admit: 2011-09-18 | Discharge: 2011-09-18 | Disposition: A | Discharge status: Home / Self Care | Payer: Medicare Other | Source: Ambulatory Visit | Attending: Internal Medicine | Admitting: Internal Medicine

## 2011-09-18 DIAGNOSIS — N183 Chronic kidney disease, stage 3 unspecified: Secondary | ICD-10-CM | POA: Insufficient documentation

## 2011-09-18 DIAGNOSIS — C5700 Malignant neoplasm of unspecified fallopian tube: Secondary | ICD-10-CM

## 2011-09-18 DIAGNOSIS — Z79899 Other long term (current) drug therapy: Secondary | ICD-10-CM | POA: Insufficient documentation

## 2011-09-18 DIAGNOSIS — D630 Anemia in neoplastic disease: Secondary | ICD-10-CM

## 2011-09-18 DIAGNOSIS — R5383 Other fatigue: Secondary | ICD-10-CM | POA: Insufficient documentation

## 2011-09-18 DIAGNOSIS — E039 Hypothyroidism, unspecified: Secondary | ICD-10-CM | POA: Insufficient documentation

## 2011-09-18 DIAGNOSIS — R5381 Other malaise: Secondary | ICD-10-CM | POA: Insufficient documentation

## 2011-09-18 DIAGNOSIS — M159 Polyosteoarthritis, unspecified: Secondary | ICD-10-CM | POA: Insufficient documentation

## 2011-09-18 DIAGNOSIS — I2510 Atherosclerotic heart disease of native coronary artery without angina pectoris: Secondary | ICD-10-CM | POA: Insufficient documentation

## 2011-09-18 DIAGNOSIS — H269 Unspecified cataract: Secondary | ICD-10-CM | POA: Insufficient documentation

## 2011-09-18 DIAGNOSIS — I509 Heart failure, unspecified: Secondary | ICD-10-CM | POA: Insufficient documentation

## 2011-09-18 DIAGNOSIS — E119 Type 2 diabetes mellitus without complications: Secondary | ICD-10-CM | POA: Insufficient documentation

## 2011-09-18 DIAGNOSIS — I129 Hypertensive chronic kidney disease with stage 1 through stage 4 chronic kidney disease, or unspecified chronic kidney disease: Secondary | ICD-10-CM | POA: Insufficient documentation

## 2011-09-18 DIAGNOSIS — E669 Obesity, unspecified: Secondary | ICD-10-CM | POA: Insufficient documentation

## 2011-09-18 DIAGNOSIS — D649 Anemia, unspecified: Secondary | ICD-10-CM | POA: Insufficient documentation

## 2011-09-18 DIAGNOSIS — D638 Anemia in other chronic diseases classified elsewhere: Secondary | ICD-10-CM | POA: Insufficient documentation

## 2011-09-18 DIAGNOSIS — K599 Functional intestinal disorder, unspecified: Secondary | ICD-10-CM | POA: Insufficient documentation

## 2011-09-18 LAB — GLUCOSE, CAPILLARY: Glucose-Capillary: 111 mg/dL — ABNORMAL HIGH (ref 70–99)

## 2011-09-18 MED ORDER — ACETAMINOPHEN 325 MG PO TABS
650.0000 mg | ORAL_TABLET | Freq: Once | ORAL | Status: AC
  Administered 2011-09-18: 650 mg via ORAL
  Filled 2011-09-18: qty 2

## 2011-09-18 NOTE — Progress Notes (Signed)
Pt being discharged home with daughter Madeleyn Schwimmer. Pt A&O, in no apparent distress. PIV removed prior to discharge.

## 2011-09-18 NOTE — H&P (Signed)
Patient ID: [name], female   DOB: 04-Sep-1920, 78 y.o.   MRN: [id number]  No chief complaint on file.   HPI [name] is a 78 y.o. female.  Patient is a 78 year old widowed black female with multiple medical problems including long-standing hypertension, diabetes mellitus, morbid obesity, renal insufficiency, degenerative joint disease of multiple sites. Patient has been having increasing problems with weakness and fatigue over the past month. She's had occasional chest pains with exertional dyspnea. She recently had an episode of severe diaphoresis for which her family to the emergency room. Cardiac enzymes were negative for acute injury. She was noted to be anemic with a hemoglobin of 8.0. She notably was not transfuse at that time referred back to the office. She is admitted at this time to the sickle cell unit to obtain a transfusion of 2 units of packed RBCs. Patient denies hematemesis, melena hematochezia. She has ongoing problems with cold intolerance Past Medical History  Diagnosis Date  . Diabetes mellitus   . Hypertension   . MI (myocardial infarction)   . Coronary artery disease     Past Surgical History  Procedure Date  . Pacemaker insertion   . Coronary artery bypass graft     History reviewed. No pertinent family history.  Social History History  Substance Use Topics  . Smoking status: Not on file  . Smokeless tobacco: Not on file  . Alcohol Use:     Allergies  Allergen Reactions  . Darvon   . Other     Tylenol #3  . Percocet (Oxycodone-Acetaminophen)   . Percodan (Oxycodone-Aspirin)    Medication list reviewed. Current Facility-Administered Medications  Medication Dose Route Frequency Provider Last Rate Last Dose  . acetaminophen (TYLENOL) tablet 650 mg  650 mg Oral Once Gwenyth Bender, MD   650 mg at 09/18/11 1105    Review of Systems As noted above.    Physical Exam Well-developed elderly obese black female in no acute  distress. Vital signs: BP: 129/71, pulse of 92, RR: 14, temperature 98.1.  HEENT: Normocephalic atraumatic. Extraocular muscle intact. No sclera icterus. No sinus tenderness. TMs with decreased light reflex with mild cerumen left ear versus right. Posterior pharynx is clear. NECK: No enlarged thyroid. No tenderness no posterior cervical nodes. Decreased range of motion of cervical spine. LUNGS: Diminished breath sounds at bases. No wheezes. No vocal fremitus. CV: Normal S1, S2 without S3. One of 6 systolic ejection murmur loudest the second left ICS. ABDOMEN: Obese, nontender. Dullness in the lower quadrants. MSK: Tenderness in lower lumbar sacral spine. Trace edema in the ankles. NEURO: Alert, oriented x3. 1+ DTRs upper extremities. Some interosseous muscle wasting of the hands. Strength otherwise intact.  Data Reviewed  Results for orders placed during the hospital encounter of 09/18/11 (from the past 48 hour(s))  GLUCOSE, CAPILLARY     Status: Abnormal   Collection Time   09/18/11  8:55 AM      Component Value Range Comment   Glucose-Capillary 111 (*) 70 - 99 mg/dL   PREPARE RBC (CROSSMATCH)     Status: Normal   Collection Time   09/18/11  9:00 AM      Component Value Range Comment   Order Confirmation ORDER PROCESSED BY BLOOD BANK       Assessment    Symptomatic anemia. Anemia of chronic disease. Chronic kidney disease. Congestive heart failure. Degenerative joint disease multiple sites. Diabetes mellitus. Coronary artery disease history without recent evidence of myocardial infarction.  Plan    Transfuse 2 units of packed RBCs slowly. Continue her home medications. Reevaluate at the end of treatment       [name] 09/18/2011, 9:10 AM

## 2011-09-18 NOTE — Discharge Summary (Signed)
[facility] Discharge Summary  Patient ID: [name] MRN: [id number] DOB/AGE: 1921-01-04 78 y.o.  Admit date: 09/18/2011 Discharge date: 09/18/2011  Admission Diagnoses:  Discharge Diagnoses:  Symptomatic anemia. Anemia of chronic disease Chronic kidney disease, stage III Coronary artery disease. No recent evidence for myocardial infarction. Congestive heart failure. Compensated Diabetes mellitus. Hypothyroidism. History of vitamin D deficiency. Degenerative joint disease of multiple sites. Chronic colonic dysfunction. Visual impairment secondary to cataracts.  Discharged Condition: Improved. Clinic Course: This elderly will black female with multiple medical problems including long-standing hypertension, diabetes mellitus, obesity, degenerative joint disease, coronary artery disease, congestive heart failure compensated was admitted for further treatment of her progressive weakness. This has been associated with worsening anemia of chronic disease. She recently was seen in emergency room complaining of weakness and diaphoresis. Cardiac workup was negative. She was noted to have a hemoglobin of 8.0. Arrangements were made for patient to be transfused here. Patient was admitted to unit in no acute distress. Her vital signs were stable. After several attempts at obtaining IV this was started successfully. Patient subsequently underwent a transfusion of 2 units of packed RBCs which she tolerated well. Patient is presently feeling better. She's looking forward to being discharged home.  Consults: None. Significant Diagnostic Studies: Hemoglobin on 8/27 was 8.0.  Disposition: 01-Home or Self Care.  Discharge Orders    Future Orders Please Complete By Expires   Diet - low sodium heart healthy      Increase activity slowly      No wound care      Call MD for:  severe uncontrolled pain      Discharge instructions      Comments:   Follow up with primary care  physician in two weeks.   Discontinue IV        Medication List  As of 09/18/2011  6:35 PM   TAKE these medications         aspirin EC 81 MG tablet   Take 81 mg by mouth daily.      cilostazol 100 MG tablet   Commonly known as: PLETAL   Take 100 mg by mouth 2 (two) times daily.      clopidogrel 75 MG tablet   Commonly known as: PLAVIX   Take 75 mg by mouth daily.      gabapentin 100 MG capsule   Commonly known as: NEURONTIN   Take 100 mg by mouth every morning.      insulin aspart 100 UNIT/ML injection   Commonly known as: novoLOG   Inject 8 Units into the skin 3 times/day as needed-between meals & bedtime. If sugar is 200 or above      insulin glargine 100 UNIT/ML injection   Commonly known as: LANTUS   Inject 8 Units into the skin every morning.      levothyroxine 75 MCG tablet   Commonly known as: SYNTHROID, LEVOTHROID   Take 75 mcg by mouth daily.      losartan 50 MG tablet   Commonly known as: COZAAR   Take 50 mg by mouth daily.      metoprolol 50 MG tablet   Commonly known as: LOPRESSOR   Take 50 mg by mouth 2 (two) times daily.      omeprazole 20 MG capsule   Commonly known as: PRILOSEC   Take 20 mg by mouth daily.      potassium chloride SA 20 MEQ tablet   Commonly known as: K-DUR,KLOR-CON  Take 20 mEq by mouth daily.      rosuvastatin 20 MG tablet   Commonly known as: CRESTOR   Take 10 mg by mouth daily.      torsemide 20 MG tablet   Commonly known as: DEMADEX   Take 20 mg by mouth daily.      Vitamin D (Ergocalciferol) 50000 UNITS Caps   Commonly known as: DRISDOL   Take 50,000 Units by mouth every 7 (seven) days. Mondays           Follow-up Information    Follow up with [name], Carrie Venters, MD in 2 weeks.   Contact information:   [address] [facility] [place] [address] [phone]         Patient will need to be arranged to receive EPO or equivalent as an outpatient. We will repeat her CBC in 3  days time.  SignedAugust Torres, [name] 09/18/2011, 6:35 PM   Time spent coordinating discharge greater than 30 minutes.

## 2011-09-19 LAB — TYPE AND SCREEN

## 2011-10-03 ENCOUNTER — Telehealth: Payer: Self-pay | Admitting: Gastroenterology

## 2011-10-03 NOTE — Telephone Encounter (Signed)
Called and left message for Ivor Messier to call back.   Dr. August Saucer requesting pt be seen for abdominal pain and progressive weight loss. Pt offered appt to see midlevel tomorrow but states she cannot come. Pt scheduled to see Dr. Arlyce Dice 10/19/11@9 :Daria Pastures to call pt with appt date and time. Cora to fax over Dr. Diamantina Providence last OV note.

## 2011-10-04 ENCOUNTER — Ambulatory Visit: Payer: Medicare Other | Admitting: Physician Assistant

## 2011-10-19 ENCOUNTER — Other Ambulatory Visit (INDEPENDENT_AMBULATORY_CARE_PROVIDER_SITE_OTHER): Payer: Medicare Other

## 2011-10-19 ENCOUNTER — Encounter: Payer: Self-pay | Admitting: Gastroenterology

## 2011-10-19 ENCOUNTER — Ambulatory Visit (INDEPENDENT_AMBULATORY_CARE_PROVIDER_SITE_OTHER): Payer: Medicare Other | Admitting: Gastroenterology

## 2011-10-19 VITALS — BP 128/82 | HR 81 | Ht 64.0 in | Wt 195.8 lb

## 2011-10-19 DIAGNOSIS — R1013 Epigastric pain: Secondary | ICD-10-CM

## 2011-10-19 DIAGNOSIS — R634 Abnormal weight loss: Secondary | ICD-10-CM

## 2011-10-19 DIAGNOSIS — E119 Type 2 diabetes mellitus without complications: Secondary | ICD-10-CM

## 2011-10-19 DIAGNOSIS — Z951 Presence of aortocoronary bypass graft: Secondary | ICD-10-CM | POA: Insufficient documentation

## 2011-10-19 DIAGNOSIS — D509 Iron deficiency anemia, unspecified: Secondary | ICD-10-CM | POA: Insufficient documentation

## 2011-10-19 DIAGNOSIS — K3189 Other diseases of stomach and duodenum: Secondary | ICD-10-CM

## 2011-10-19 DIAGNOSIS — I2510 Atherosclerotic heart disease of native coronary artery without angina pectoris: Secondary | ICD-10-CM

## 2011-10-19 DIAGNOSIS — R109 Unspecified abdominal pain: Secondary | ICD-10-CM

## 2011-10-19 DIAGNOSIS — E118 Type 2 diabetes mellitus with unspecified complications: Secondary | ICD-10-CM | POA: Insufficient documentation

## 2011-10-19 LAB — CBC WITH DIFFERENTIAL/PLATELET
Eosinophils Relative: 0.4 % (ref 0.0–5.0)
HCT: 30.5 % — ABNORMAL LOW (ref 36.0–46.0)
Hemoglobin: 9.6 g/dL — ABNORMAL LOW (ref 12.0–15.0)
Lymphs Abs: 1.3 10*3/uL (ref 0.7–4.0)
Monocytes Relative: 7.1 % (ref 3.0–12.0)
Neutro Abs: 4.8 10*3/uL (ref 1.4–7.7)
Platelets: 271 10*3/uL (ref 150.0–400.0)
RBC: 3.89 Mil/uL (ref 3.87–5.11)
WBC: 6.6 10*3/uL (ref 4.5–10.5)

## 2011-10-19 LAB — IBC PANEL: Iron: 29 ug/dL — ABNORMAL LOW (ref 42–145)

## 2011-10-19 LAB — COMPREHENSIVE METABOLIC PANEL
CO2: 25 mEq/L (ref 19–32)
GFR: 29.98 mL/min — ABNORMAL LOW (ref >60.00)
Glucose, Bld: 147 mg/dL — ABNORMAL HIGH (ref 70–99)
Sodium: 137 mEq/L (ref 135–145)
Total Bilirubin: 0.6 mg/dL (ref 0.3–1.2)
Total Protein: 6.8 g/dL (ref 6.0–8.3)

## 2011-10-19 LAB — FERRITIN: Ferritin: 413.9 ng/mL — ABNORMAL HIGH (ref 10.0–291.0)

## 2011-10-19 NOTE — Assessment & Plan Note (Signed)
Plan to check Hemoccults and iron studies

## 2011-10-19 NOTE — Patient Instructions (Addendum)
Your physician has requested that you go to the basement for the following lab work before leaving today:  

## 2011-10-19 NOTE — Progress Notes (Signed)
History of Present Illness: Very pleasant 78 year old Afro-American female referred at the request of Dr. August Saucer for dyspepsia. She's complaining of anorexia, mild nausea with eating and mild pyrosis. She's lost over 25 pounds in the past 9 months.  Hemoglobin has stopped over the past 6 months from 10.7 in February to 8.0 in August, 2013. She received a blood transfusion. MCV was 77.8. She's had no overt GI bleeding including melena or hematochezia. There's been no change in bowel habits. She complains of very mild abdominal pain both in the left and right lower quadrants. CT scan in May, 2013 was unrevealing.   Past Medical History  Diagnosis Date  . Diabetes mellitus   . Hypertension   . MI (myocardial infarction)   . Coronary artery disease    Past Surgical History  Procedure Date  . Pacemaker insertion   . Coronary artery bypass graft    family history includes Breast cancer in her daughter; Colon polyps in her daughter; Diabetes in her daughter, father, and son; and Heart disease in her father. Current Outpatient Prescriptions  Medication Sig Dispense Refill  . aspirin EC 81 MG tablet Take 81 mg by mouth daily.      . cilostazol (PLETAL) 100 MG tablet Take 100 mg by mouth 2 (two) times daily.      . clopidogrel (PLAVIX) 75 MG tablet Take 75 mg by mouth daily.      Marland Kitchen gabapentin (NEURONTIN) 100 MG capsule Take 100 mg by mouth 2 (two) times daily.       . insulin aspart (NOVOLOG) 100 UNIT/ML injection Inject 8 Units into the skin as needed. If sugar is 200 or above      . insulin glargine (LANTUS) 100 UNIT/ML injection Inject 8 Units into the skin as needed.       Marland Kitchen levothyroxine (SYNTHROID, LEVOTHROID) 75 MCG tablet Take 75 mcg by mouth daily.      Marland Kitchen losartan (COZAAR) 50 MG tablet Take 50 mg by mouth daily.      . metoprolol (LOPRESSOR) 50 MG tablet Take 50 mg by mouth 2 (two) times daily.      Marland Kitchen omeprazole (PRILOSEC) 20 MG capsule Take 20 mg by mouth daily.      . potassium chloride SA  (K-DUR,KLOR-CON) 20 MEQ tablet Take 20 mEq by mouth daily.      . rosuvastatin (CRESTOR) 20 MG tablet Take 10 mg by mouth daily.      Marland Kitchen torsemide (DEMADEX) 20 MG tablet Take 20 mg by mouth daily.      . Vitamin D, Ergocalciferol, (DRISDOL) 50000 UNITS CAPS Take 50,000 Units by mouth every 7 (seven) days. Mondays       Allergies as of 10/19/2011 - Review Complete 10/19/2011  Allergen Reaction Noted  . Darvon  03/14/2011  . Other  09/12/2011  . Percocet (oxycodone-acetaminophen)  03/14/2011  . Percodan (oxycodone-aspirin)  09/12/2011    reports that she has never smoked. She has never used smokeless tobacco. She reports that she does not drink alcohol. Her drug history not on file.     Review of Systems: She has lower stemming weakness and cannot ambulate on her own.  Pertinent positive and negative review of systems were noted in the above HPI section. All other review of systems were otherwise negative.  Vital signs were reviewed in today's medical record Physical Exam: General: She is an elderly female examined while sitting in a wheelchair s Head: Normocephalic and atraumatic Eyes:  sclerae anicteric, EOMI Ears:  Normal auditory acuity Mouth: No deformity or lesions Neck: Supple, no masses or thyromegaly Lungs: Clear throughout to auscultation Heart: Regular rate and rhythm; no murmurs, rubs or bruits Abdomen: Soft, non tender and non distended. No masses, hepatosplenomegaly or hernias noted. Normal Bowel sounds Rectal:deferred Musculoskeletal: Symmetrical with no gross deformities  Skin: No lesions on visible extremities Pulses:  Normal pulses noted Extremities: No clubbing, cyanosis,  or deformities noted. She has 3+ ankle edema Neurological: Alert oriented x 4, grossly nonfocal Cervical Nodes:  No significant cervical adenopathy Inguinal Nodes: No significant inguinal adenopathy Psychological:  Alert and cooperative. Normal mood and affect

## 2011-10-19 NOTE — Assessment & Plan Note (Addendum)
Symptoms including anorexia, weight loss, microcytic anemia and abdominal pain are concerning for occult malignancy. CT scan from May, 2013, was unrevealing, however. Occult GI malignancy must still be considered. Dyspepsia and anorexia from gastroparesis is a possibility.  Recommendations #1 Hemoccults #2 check CBC, iron, TIBC, ferritin and comprehensive metabolic profile #3 to consider further studies including endoscopic studies, repeat CT pending results of above

## 2011-10-27 ENCOUNTER — Other Ambulatory Visit: Payer: Medicare Other

## 2011-10-27 DIAGNOSIS — R634 Abnormal weight loss: Secondary | ICD-10-CM

## 2011-10-27 DIAGNOSIS — R109 Unspecified abdominal pain: Secondary | ICD-10-CM

## 2011-11-06 ENCOUNTER — Telehealth: Payer: Self-pay | Admitting: Gastroenterology

## 2011-11-06 ENCOUNTER — Non-Acute Institutional Stay (HOSPITAL_COMMUNITY)
Admission: AD | Admit: 2011-11-06 | Discharge: 2011-11-06 | Disposition: A | Discharge status: Home / Self Care | Payer: Medicare Other | Attending: Internal Medicine | Admitting: Internal Medicine

## 2011-11-06 DIAGNOSIS — Z79899 Other long term (current) drug therapy: Secondary | ICD-10-CM | POA: Insufficient documentation

## 2011-11-06 DIAGNOSIS — K599 Functional intestinal disorder, unspecified: Secondary | ICD-10-CM | POA: Insufficient documentation

## 2011-11-06 DIAGNOSIS — Z951 Presence of aortocoronary bypass graft: Secondary | ICD-10-CM | POA: Insufficient documentation

## 2011-11-06 DIAGNOSIS — D649 Anemia, unspecified: Secondary | ICD-10-CM | POA: Insufficient documentation

## 2011-11-06 DIAGNOSIS — I2510 Atherosclerotic heart disease of native coronary artery without angina pectoris: Secondary | ICD-10-CM | POA: Insufficient documentation

## 2011-11-06 DIAGNOSIS — R5381 Other malaise: Secondary | ICD-10-CM | POA: Insufficient documentation

## 2011-11-06 DIAGNOSIS — I252 Old myocardial infarction: Secondary | ICD-10-CM | POA: Insufficient documentation

## 2011-11-06 DIAGNOSIS — Z950 Presence of cardiac pacemaker: Secondary | ICD-10-CM | POA: Insufficient documentation

## 2011-11-06 DIAGNOSIS — Z7982 Long term (current) use of aspirin: Secondary | ICD-10-CM | POA: Insufficient documentation

## 2011-11-06 DIAGNOSIS — E119 Type 2 diabetes mellitus without complications: Secondary | ICD-10-CM | POA: Insufficient documentation

## 2011-11-06 LAB — CBC WITH DIFFERENTIAL/PLATELET
Lymphocytes Relative: 19 % (ref 12–46)
Lymphs Abs: 1.6 10*3/uL (ref 0.7–4.0)
Neutrophils Relative %: 72 % (ref 43–77)
Platelets: 226 10*3/uL (ref 150–400)
RBC: 4.05 MIL/uL (ref 3.87–5.11)
WBC: 8.5 10*3/uL (ref 4.0–10.5)

## 2011-11-06 LAB — SAMPLE TO BLOOD BANK

## 2011-11-06 LAB — COMPREHENSIVE METABOLIC PANEL
Albumin: 2.8 g/dL — ABNORMAL LOW (ref 3.5–5.2)
BUN: 20 mg/dL (ref 6–23)
CO2: 20 mEq/L (ref 19–32)
Chloride: 99 mEq/L (ref 96–112)
Creatinine, Ser: 2.39 mg/dL — ABNORMAL HIGH (ref 0.50–1.10)
GFR calc Af Amer: 20 mL/min — ABNORMAL LOW (ref >90)
GFR calc non Af Amer: 17 mL/min — ABNORMAL LOW (ref >90)
Glucose, Bld: 131 mg/dL — ABNORMAL HIGH (ref 70–99)
Total Bilirubin: 0.4 mg/dL (ref 0.3–1.2)

## 2011-11-06 LAB — PRO B NATRIURETIC PEPTIDE: Pro B Natriuretic peptide (BNP): 2645 pg/mL — ABNORMAL HIGH (ref 0–450)

## 2011-11-06 NOTE — Progress Notes (Signed)
Patient ID: [name], female   DOB: [birth year], 78 y.o.   MRN: [id number] Labs drawn as per Dr Diamantina Providence order.  Pt left via wheelchair, dtr took her out

## 2011-11-06 NOTE — Telephone Encounter (Signed)
Pts daughter states that her mother fell and hurt her knee. She will not be able to have procedure tomorrow. Daughter wants to make sure that her mother is not charged for cancelling the procedure.

## 2011-11-06 NOTE — Telephone Encounter (Signed)
ok 

## 2011-11-07 ENCOUNTER — Encounter: Payer: Medicare Other | Admitting: Gastroenterology

## 2011-11-17 ENCOUNTER — Observation Stay (HOSPITAL_COMMUNITY)
Admission: EM | Admit: 2011-11-17 | Discharge: 2011-11-18 | Disposition: A | Discharge status: Home / Self Care | Payer: Medicare Other | Attending: Emergency Medicine | Admitting: Emergency Medicine

## 2011-11-17 ENCOUNTER — Encounter (HOSPITAL_COMMUNITY): Payer: Self-pay | Admitting: Emergency Medicine

## 2011-11-17 DIAGNOSIS — Z79899 Other long term (current) drug therapy: Secondary | ICD-10-CM | POA: Insufficient documentation

## 2011-11-17 DIAGNOSIS — I10 Essential (primary) hypertension: Secondary | ICD-10-CM | POA: Insufficient documentation

## 2011-11-17 DIAGNOSIS — I252 Old myocardial infarction: Secondary | ICD-10-CM | POA: Insufficient documentation

## 2011-11-17 DIAGNOSIS — H543 Unqualified visual loss, both eyes: Secondary | ICD-10-CM | POA: Insufficient documentation

## 2011-11-17 DIAGNOSIS — R5381 Other malaise: Principal | ICD-10-CM | POA: Insufficient documentation

## 2011-11-17 DIAGNOSIS — I2510 Atherosclerotic heart disease of native coronary artery without angina pectoris: Secondary | ICD-10-CM | POA: Insufficient documentation

## 2011-11-17 DIAGNOSIS — R531 Weakness: Secondary | ICD-10-CM

## 2011-11-17 DIAGNOSIS — Z950 Presence of cardiac pacemaker: Secondary | ICD-10-CM | POA: Insufficient documentation

## 2011-11-17 DIAGNOSIS — R5383 Other fatigue: Secondary | ICD-10-CM | POA: Insufficient documentation

## 2011-11-17 DIAGNOSIS — E119 Type 2 diabetes mellitus without complications: Secondary | ICD-10-CM | POA: Insufficient documentation

## 2011-11-17 DIAGNOSIS — Z7982 Long term (current) use of aspirin: Secondary | ICD-10-CM | POA: Insufficient documentation

## 2011-11-17 HISTORY — DX: Legal blindness, as defined in USA: H54.8

## 2011-11-17 LAB — POCT I-STAT TROPONIN I

## 2011-11-17 LAB — BASIC METABOLIC PANEL
BUN: 15 mg/dL (ref 6–23)
Chloride: 106 mEq/L (ref 96–112)
Creatinine, Ser: 1.67 mg/dL — ABNORMAL HIGH (ref 0.50–1.10)
GFR calc Af Amer: 30 mL/min — ABNORMAL LOW (ref >90)
GFR calc non Af Amer: 26 mL/min — ABNORMAL LOW (ref >90)

## 2011-11-17 LAB — CBC
HCT: 28.1 % — ABNORMAL LOW (ref 36.0–46.0)
MCH: 24.2 pg — ABNORMAL LOW (ref 26.0–34.0)
MCHC: 31 g/dL (ref 30.0–36.0)
RDW: 17.8 % — ABNORMAL HIGH (ref 11.5–15.5)

## 2011-11-17 NOTE — ED Notes (Addendum)
Per EMS, takes laxative weekly to move bowels, she had been on toilet for 1.5 hours when called, became weak, diaphoretic, nauseous, emesis; pt has pacemaker; similar incident happened 2 months ago, pt needed blood transfusion that time; 88/46 BP, HR 72, 96% RA 16 RR, 138 CBG; 20g L wrist with NS infusing, has received approx en route; 12 lead unremarkable- v-paced

## 2011-11-17 NOTE — ED Provider Notes (Signed)
History     CSN: [id number]  Arrival date & time 11/17/11  1715   First MD Initiated Contact with Patient 11/17/11 1911      Chief Complaint  Patient presents with  . Weakness    (Consider location/radiation/quality/duration/timing/severity/associated sxs/prior treatment) HPI  78 year old female with history of CAD, hypertension, and diabetes presents with a chief complaints of generalized weakness.  Patient reports she used weekly stool softener to help with her bowel movement. While sitting on the commode today patient felt lightheadedness, nausea, vomit several times, and was diaphoretic with shortness of breath. Symptoms lasting for an hour. Vomitus was nonbloody, nonbilious. She also reports nonbloody diarrhea. Currently endorse generalized weakness, but denies numbness.  The onset was gradual, moderate in severity, improving.  She denies fever, headache, chest pain, abdominal pain, or dysuria.  She denies cough, or hemoptysis. Denies hematochezia, hematuria, or melena. She has a similar episode 2 months ago, was evaluated at the hospital and was diagnosed with anemia which required blood transfusion.  Pt lives at home with daughter.  Pt has hx of CHF, currently taking torsemide.  Denies weight changes.  Pt has pacemaker.     Past Medical History  Diagnosis Date  . Diabetes mellitus   . Hypertension   . MI (myocardial infarction)   . Coronary artery disease   . Legally blind     Past Surgical History  Procedure Date  . Pacemaker insertion   . Coronary artery bypass graft     Family History  Problem Relation Age of Onset  . Breast cancer Daughter   . Diabetes Son   . Diabetes Daughter   . Heart disease Father   . Diabetes Father   . Colon polyps Daughter     History  Substance Use Topics  . Smoking status: Never Smoker   . Smokeless tobacco: Never Used  . Alcohol Use: No    OB History    Grav Para Term Preterm Abortions TAB SAB Ect Mult Living                  Review of Systems  All other systems reviewed and are negative.    Allergies  Darvon; Other; Percocet; and Percodan  Home Medications   Current Outpatient Rx  Name Route Sig Dispense Refill  . ASPIRIN 81 MG PO CHEW Oral Chew 81 mg by mouth daily.    Marland Kitchen CILOSTAZOL 100 MG PO TABS Oral Take 100 mg by mouth 2 (two) times daily.    Marland Kitchen CLOPIDOGREL BISULFATE 75 MG PO TABS Oral Take 75 mg by mouth daily.    Marland Kitchen GABAPENTIN 100 MG PO CAPS Oral Take 100 mg by mouth 2 (two) times daily.     Marland Kitchen LEVOTHYROXINE SODIUM 50 MCG PO TABS Oral Take 50 mcg by mouth daily.    Marland Kitchen LORATADINE 10 MG PO TABS Oral Take 10 mg by mouth daily as needed. For allergies    . LOSARTAN POTASSIUM 50 MG PO TABS Oral Take 50 mg by mouth daily.    Marland Kitchen MAGNESIUM CITRATE PO SOLN Oral Take 1 Bottle by mouth once a week.    Marland Kitchen METOPROLOL TARTRATE 50 MG PO TABS Oral Take 50 mg by mouth 2 (two) times daily.    Marland Kitchen NITROGLYCERIN 0.4 MG/HR TD PT24 Transdermal Place 1 patch onto the skin daily.    Marland Kitchen OMEPRAZOLE 20 MG PO CPDR Oral Take 20 mg by mouth daily.    Marland Kitchen POTASSIUM CHLORIDE CRYS ER 20 MEQ PO TBCR  Oral Take 20 mEq by mouth daily.    Marland Kitchen ROSUVASTATIN CALCIUM 20 MG PO TABS Oral Take 10 mg by mouth daily.    . TORSEMIDE 20 MG PO TABS Oral Take 20 mg by mouth daily.    Marland Kitchen VITAMIN D (ERGOCALCIFEROL) 50000 UNITS PO CAPS Oral Take 50,000 Units by mouth every 7 (seven) days. Mondays      BP 113/63  Pulse 80  Temp 97.9 F (36.6 C) (Oral)  Resp 18  SpO2 100%  Physical Exam  Nursing note and vitals reviewed. Constitutional: She is oriented to person, place, and time. She appears well-developed and well-nourished. No distress.  HENT:  Head: Atraumatic.  Mouth/Throat: Oropharynx is clear and moist. No oropharyngeal exudate.  Eyes: Conjunctivae normal are normal.  Neck: Normal range of motion. Neck supple.  Cardiovascular: Normal rate, regular rhythm, normal heart sounds and intact distal pulses.   Pulmonary/Chest: Effort normal and breath  sounds normal. No respiratory distress. She has no rales. She exhibits no tenderness.        Decreased lung sounds secondary to large body habitus.  No obvious rales in exam.  Abdominal: Soft. She exhibits no distension. There is no tenderness.  Genitourinary: Guaiac negative stool.       Chaperone present  Musculoskeletal: She exhibits no edema and no tenderness.       Normal grip strength bilaterally. 4/5 strength to lower extremities.  Pedal pulse intact  Neurological: She is alert and oriented to person, place, and time.  Skin: Skin is warm. No rash noted.  Psychiatric: She has a normal mood and affect.    ED Course  Procedures (including critical care time)   Labs Reviewed  BASIC METABOLIC PANEL  CBC   No results found.   No diagnosis found.   Date: 11/17/2011  Rate: 80  Rhythm: normal sinus rhythm  QRS Axis: left  Intervals: PR prolonged and QT prolonged  ST/T Wave abnormalities: nonspecific ST/T changes  Conduction Disutrbances:first-degree A-V block  and nonspecific intraventricular conduction delay  Narrative Interpretation: no pacing seen compare to prior ECG, however pt show pacing in room monitor  Old EKG Reviewed: unchanged  Results for orders placed during the hospital encounter of 11/17/11  BASIC METABOLIC PANEL      Component Value Range   Sodium 143  135 - 145 mEq/L   Potassium 4.8  3.5 - 5.1 mEq/L   Chloride 106  96 - 112 mEq/L   CO2 27  19 - 32 mEq/L   Glucose, Bld 162 (*) 70 - 99 mg/dL   BUN 15  6 - 23 mg/dL   Creatinine, Ser 4.09 (*) 0.50 - 1.10 mg/dL   Calcium 9.4  8.4 - 81.1 mg/dL   GFR calc non Af Amer 26 (*) >90 mL/min   GFR calc Af Amer 30 (*) >90 mL/min  CBC      Component Value Range   WBC 6.7  4.0 - 10.5 K/uL   RBC 3.59 (*) 3.87 - 5.11 MIL/uL   Hemoglobin 8.7 (*) 12.0 - 15.0 g/dL   HCT 91.4 (*) 78.2 - 95.6 %   MCV 78.3  78.0 - 100.0 fL   MCH 24.2 (*) 26.0 - 34.0 pg   MCHC 31.0  30.0 - 36.0 g/dL   RDW 21.3 (*) 08.6 - 57.8 %    Platelets 268  150 - 400 K/uL  POCT I-STAT TROPONIN I      Component Value Range   Troponin i, poc 0.01  0.00 -  0.08 ng/mL   Comment 3           OCCULT BLOOD, POC DEVICE      Component Value Range   Fecal Occult Bld NEGATIVE    POCT I-STAT TROPONIN I      Component Value Range   Troponin i, poc 0.00  0.00 - 0.08 ng/mL   Comment 3            No results found.  1. Generalized weakness  MDM  Pt with a bout of n/v/d diaphoretic, sob, and weakness.  Currently denies cp.  Has prior hx of anemia with similar presentation.  Denies any abnormal bleeding.  Work up initiated, discussed care with my attending.  8:21 PM Pt is back to baseline.  Hgb 8.7.  VSS.  ECG shows no acute changes, first set of troponin is negative.  My attending recommend obtaining 3 sets of cardiac markers.  If negative, pt can be f/u with her PCP.  Discussed with CDU PA, who will continue care.    12:29 AM Pt has negative delta troponin.  Pt currently in NAD.  I recommend f/u with PCP for further care.  Pt and family members voice understanding and agrees with plan.    BP 123/91  Pulse 80  Temp 97.9 F (36.6 C) (Oral)  Resp 18  SpO2 98%  I have reviewed nursing notes and vital signs. I personally reviewed the imaging tests through PACS system  I reviewed available ER/hospitalization records thought the EMR     Fayrene Helper, [place] 11/18/11 2130

## 2011-11-17 NOTE — ED Provider Notes (Signed)
[name] is a 78 y.o. female in CDU pending serial troponins from pod A. Sign out from [facility] as follows: Complaining of weakness front/presyncopal episode while moving her bowels earlier in the day. Patient is mildly anemic in no need of acute transfusion. EKG shows a paced rhythm nonischemic.  Patient seen in her room at CDU she is resting comfortably asleep in her bed. Physical exam deferred so that she can rest.  Case signed out to Dr. Fonnie Jarvis at shift change pending last troponin.  Wynetta Emery, PA-C 11/18/11 [phone]

## 2011-11-17 NOTE — ED Notes (Signed)
Report given to alan in cdu.  Pt transported via stretcher to cdu 7.

## 2011-11-17 NOTE — ED Provider Notes (Signed)
Medical screening examination/treatment/procedure(s) were conducted as a shared visit with non-physician practitioner(s) and myself.  I personally evaluated the patient during the encounter.  Transient spell <1 hour while/after having a bowel movement of presyncope with generalized weakness, diaphoresis, nausea and nonbloody vomiting, and slight shortness of breath without chest pain. Patient is currently back to baseline. Patient has not been having bloody stools all. She does have a pacemaker in as a paced rhythm on the cardiac monitor in the ED. Patient has anemia of uncertain etiology with hemoglobin over 8 today. Her hemoglobin has decreased in the last 2 weeks but it does not appear as if she needs emergent transfusion in the ED.  Hurman Horn, MD 11/19/11 1816

## 2011-11-18 LAB — POCT I-STAT TROPONIN I

## 2011-11-18 NOTE — ED Notes (Signed)
Patient and daughter waiting for ride. No complaints at this time.

## 2011-11-19 NOTE — ED Provider Notes (Signed)
Medical screening examination/treatment/procedure(s) were conducted as a shared visit with non-physician practitioner(s) and myself.  I personally evaluated the patient during the encounter  Elliot Simoneaux M Issiac Jamar, MD 11/19/11 1634 

## 2011-11-19 NOTE — ED Provider Notes (Signed)
Medical screening examination/treatment/procedure(s) were conducted as a shared visit with non-physician practitioner(s) and myself.  I personally evaluated the patient during the encounter  Hurman Horn, MD 11/19/11 [phone]

## 2011-11-22 NOTE — H&P (Signed)
[facility]  Patient ID: [name], female   DOB: Feb 14, 1920, 78 y.o.   MRN: [id number]  No chief complaint on file.   HPI [name] is a 78 y.o. female.  With history of diabetes mellitus, coronary artery disease, recent problems with iron deficiency anemia. Patient comes to the clinic to have blood drawn urgently. She denies chest pains or shortness of breath at this time. She does complain of diffuse weakness. Denies hematemesis melena hematochezia.  Past Medical History  Diagnosis Date  . Diabetes mellitus   . Hypertension   . MI (myocardial infarction)   . Coronary artery disease   . Legally blind     Past Surgical History  Procedure Date  . Pacemaker insertion   . Coronary artery bypass graft     Family History  Problem Relation Age of Onset  . Breast cancer Daughter   . Diabetes Son   . Diabetes Daughter   . Heart disease Father   . Diabetes Father   . Colon polyps Daughter     Social History History  Substance Use Topics  . Smoking status: Never Smoker   . Smokeless tobacco: Never Used  . Alcohol Use: No    Allergies  Allergen Reactions  . Darvon   . Other     Tylenol #3  . Percocet (Oxycodone-Acetaminophen)   . Percodan (Oxycodone-Aspirin)     No current facility-administered medications for this encounter.   Current Outpatient Prescriptions  Medication Sig Dispense Refill  . aspirin 81 MG chewable tablet Chew 81 mg by mouth daily.      . cilostazol (PLETAL) 100 MG tablet Take 100 mg by mouth 2 (two) times daily.      . clopidogrel (PLAVIX) 75 MG tablet Take 75 mg by mouth daily.      Marland Kitchen gabapentin (NEURONTIN) 100 MG capsule Take 100 mg by mouth 2 (two) times daily.       Marland Kitchen levothyroxine (SYNTHROID, LEVOTHROID) 50 MCG tablet Take 50 mcg by mouth daily.      Marland Kitchen loratadine (CLARITIN) 10 MG tablet Take 10 mg by mouth daily as needed. For allergies      . losartan (COZAAR) 50 MG tablet Take 50 mg by mouth  daily.      . magnesium citrate SOLN Take 1 Bottle by mouth once a week.      . metoprolol (LOPRESSOR) 50 MG tablet Take 50 mg by mouth 2 (two) times daily.      . nitroGLYCERIN (NITRODUR - DOSED IN MG/24 HR) 0.4 mg/hr Place 1 patch onto the skin daily.      Marland Kitchen omeprazole (PRILOSEC) 20 MG capsule Take 20 mg by mouth daily.      . potassium chloride SA (K-DUR,KLOR-CON) 20 MEQ tablet Take 20 mEq by mouth daily.      . rosuvastatin (CRESTOR) 20 MG tablet Take 10 mg by mouth daily.      Marland Kitchen torsemide (DEMADEX) 20 MG tablet Take 20 mg by mouth daily.      . Vitamin D, Ergocalciferol, (DRISDOL) 50000 UNITS CAPS Take 50,000 Units by mouth every 7 (seven) days. Mondays        Review of Systems As noted above.  There were no vitals taken for this visit.  Physical Exam Well-developed elderly black female in wheelchair. No acute distress. HEENT: No sinus tenderness. No posterior cervical nodes. NECK: Hourglass slightly palpable. Tenderness along the left posterior cervical region. No adenopathy appreciated. LUNGS: Clear to  auscultation. Distant breath sounds. No wheezes or rales. No CVA tenderness. CV: Normal S1, S2 without S3. EXTREMITIES: Negative Homans. Trace edema.   Data Reviewed  Assessment    Microcytic anemia. Rule out GI loss.  Diabetes mellitus.  Progressive weakness. Rule out symptomatic anemia.  Distant history coronary artery disease.  Degenerative joint disease.  Chronic colonic dysfunction.     Plan    Blood drawn today for CBC. CMET. Clot type and crossmatch should she be significantly anemic.  Transfuse in the day hospital in 24-48 hours pending results of the above.        Bless Belshe 11/06/2011, 1:00 PM

## 2011-11-22 NOTE — H&P (Signed)
[facility] Discharge Summary  Patient ID: [name] MRN: [id number] DOB/AGE: 76-08-1920 76 y.o.  Admit date: 11/06/2011 Discharge date:  11/06/2011  Admission Diagnoses:  Discharge Diagnoses:  Microcytic anemia Progress weakness. Diabetes mellitus. History coronary artery disease. Colonic dysfunction.  Discharged Condition:  Unchanged.  Clinic Course:  [facility]. She has been drinking increasing weakness. There's been no associated chest pains or shortness of breath. Patient had been noted recently to have a low hemoglobin. She's having blood drawn at this time for followup. A CBC, CMET in clot type and crossmatch was obtained. Patient tolerated procedure well.  Consults: None  Significant Diagnostic Studies:    Disposition: 01-Home or Self Care     Medication List     As of 11/22/2011  6:17 PM    ASK your doctor about these medications         cilostazol 100 MG tablet   Commonly known as: PLETAL   Take 100 mg by mouth 2 (two) times daily.      clopidogrel 75 MG tablet   Commonly known as: PLAVIX   Take 75 mg by mouth daily.      gabapentin 100 MG capsule   Commonly known as: NEURONTIN   Take 100 mg by mouth 2 (two) times daily.      losartan 50 MG tablet   Commonly known as: COZAAR   Take 50 mg by mouth daily.      metoprolol 50 MG tablet   Commonly known as: LOPRESSOR   Take 50 mg by mouth 2 (two) times daily.      omeprazole 20 MG capsule   Commonly known as: PRILOSEC   Take 20 mg by mouth daily.      potassium chloride SA 20 MEQ tablet   Commonly known as: K-DUR,KLOR-CON   Take 20 mEq by mouth daily.      rosuvastatin 20 MG tablet   Commonly known as: CRESTOR   Take 10 mg by mouth daily.      torsemide 20 MG tablet   Commonly known as: DEMADEX   Take 20 mg by mouth daily.      Vitamin D (Ergocalciferol) 50000 UNITS Caps   Commonly known as: DRISDOL   Take  50,000 Units by mouth every 7 (seven) days. Mondays         Signed: August Saucer, Hadlie Gipson 11/06/2011, 4:00 PM

## 2011-12-14 ENCOUNTER — Ambulatory Visit (HOSPITAL_COMMUNITY)
Admission: AD | Admit: 2011-12-14 | Discharge: 2011-12-14 | Disposition: A | Discharge status: Home / Self Care | Payer: Medicare Other | Source: Ambulatory Visit | Attending: Internal Medicine | Admitting: Internal Medicine

## 2011-12-14 ENCOUNTER — Encounter (HOSPITAL_COMMUNITY): Payer: Self-pay | Admitting: *Deleted

## 2011-12-14 ENCOUNTER — Observation Stay (HOSPITAL_BASED_OUTPATIENT_CLINIC_OR_DEPARTMENT_OTHER)
Admission: AD | Admit: 2011-12-14 | Discharge: 2011-12-17 | Disposition: A | Discharge status: Home / Self Care | Payer: Medicare Other | Source: Ambulatory Visit | Attending: Internal Medicine | Admitting: Internal Medicine

## 2011-12-14 DIAGNOSIS — R634 Abnormal weight loss: Secondary | ICD-10-CM

## 2011-12-14 DIAGNOSIS — N179 Acute kidney failure, unspecified: Secondary | ICD-10-CM

## 2011-12-14 DIAGNOSIS — Z951 Presence of aortocoronary bypass graft: Secondary | ICD-10-CM | POA: Insufficient documentation

## 2011-12-14 DIAGNOSIS — D509 Iron deficiency anemia, unspecified: Secondary | ICD-10-CM | POA: Insufficient documentation

## 2011-12-14 DIAGNOSIS — Z950 Presence of cardiac pacemaker: Secondary | ICD-10-CM

## 2011-12-14 DIAGNOSIS — K3189 Other diseases of stomach and duodenum: Secondary | ICD-10-CM | POA: Insufficient documentation

## 2011-12-14 DIAGNOSIS — Z79899 Other long term (current) drug therapy: Secondary | ICD-10-CM | POA: Insufficient documentation

## 2011-12-14 DIAGNOSIS — I252 Old myocardial infarction: Secondary | ICD-10-CM | POA: Insufficient documentation

## 2011-12-14 DIAGNOSIS — I10 Essential (primary) hypertension: Secondary | ICD-10-CM

## 2011-12-14 DIAGNOSIS — E1142 Type 2 diabetes mellitus with diabetic polyneuropathy: Secondary | ICD-10-CM | POA: Diagnosis present

## 2011-12-14 DIAGNOSIS — H548 Legal blindness, as defined in USA: Secondary | ICD-10-CM | POA: Diagnosis present

## 2011-12-14 DIAGNOSIS — D631 Anemia in chronic kidney disease: Secondary | ICD-10-CM | POA: Diagnosis present

## 2011-12-14 DIAGNOSIS — N189 Chronic kidney disease, unspecified: Secondary | ICD-10-CM | POA: Insufficient documentation

## 2011-12-14 DIAGNOSIS — M79609 Pain in unspecified limb: Secondary | ICD-10-CM | POA: Insufficient documentation

## 2011-12-14 DIAGNOSIS — E871 Hypo-osmolality and hyponatremia: Secondary | ICD-10-CM | POA: Insufficient documentation

## 2011-12-14 DIAGNOSIS — I2510 Atherosclerotic heart disease of native coronary artery without angina pectoris: Secondary | ICD-10-CM

## 2011-12-14 DIAGNOSIS — A419 Sepsis, unspecified organism: Principal | ICD-10-CM | POA: Diagnosis present

## 2011-12-14 DIAGNOSIS — I129 Hypertensive chronic kidney disease with stage 1 through stage 4 chronic kidney disease, or unspecified chronic kidney disease: Secondary | ICD-10-CM | POA: Diagnosis present

## 2011-12-14 DIAGNOSIS — R1013 Epigastric pain: Secondary | ICD-10-CM

## 2011-12-14 DIAGNOSIS — N3000 Acute cystitis without hematuria: Secondary | ICD-10-CM | POA: Insufficient documentation

## 2011-12-14 DIAGNOSIS — E1149 Type 2 diabetes mellitus with other diabetic neurological complication: Secondary | ICD-10-CM | POA: Diagnosis present

## 2011-12-14 DIAGNOSIS — E875 Hyperkalemia: Secondary | ICD-10-CM

## 2011-12-14 DIAGNOSIS — R627 Adult failure to thrive: Secondary | ICD-10-CM | POA: Diagnosis present

## 2011-12-14 DIAGNOSIS — N390 Urinary tract infection, site not specified: Secondary | ICD-10-CM | POA: Diagnosis present

## 2011-12-14 DIAGNOSIS — R509 Fever, unspecified: Secondary | ICD-10-CM | POA: Insufficient documentation

## 2011-12-14 DIAGNOSIS — E876 Hypokalemia: Secondary | ICD-10-CM | POA: Diagnosis present

## 2011-12-14 DIAGNOSIS — K449 Diaphragmatic hernia without obstruction or gangrene: Secondary | ICD-10-CM | POA: Diagnosis present

## 2011-12-14 DIAGNOSIS — R5381 Other malaise: Secondary | ICD-10-CM | POA: Diagnosis present

## 2011-12-14 DIAGNOSIS — E119 Type 2 diabetes mellitus without complications: Secondary | ICD-10-CM

## 2011-12-14 DIAGNOSIS — R0789 Other chest pain: Secondary | ICD-10-CM | POA: Insufficient documentation

## 2011-12-14 DIAGNOSIS — I959 Hypotension, unspecified: Secondary | ICD-10-CM

## 2011-12-14 DIAGNOSIS — N039 Chronic nephritic syndrome with unspecified morphologic changes: Secondary | ICD-10-CM | POA: Diagnosis present

## 2011-12-14 DIAGNOSIS — R42 Dizziness and giddiness: Secondary | ICD-10-CM | POA: Insufficient documentation

## 2011-12-14 DIAGNOSIS — E46 Unspecified protein-calorie malnutrition: Secondary | ICD-10-CM | POA: Diagnosis present

## 2011-12-14 DIAGNOSIS — E039 Hypothyroidism, unspecified: Secondary | ICD-10-CM | POA: Insufficient documentation

## 2011-12-14 DIAGNOSIS — N183 Chronic kidney disease, stage 3 unspecified: Secondary | ICD-10-CM | POA: Diagnosis present

## 2011-12-14 DIAGNOSIS — Z6836 Body mass index (BMI) 36.0-36.9, adult: Secondary | ICD-10-CM

## 2011-12-14 DIAGNOSIS — Z7982 Long term (current) use of aspirin: Secondary | ICD-10-CM | POA: Insufficient documentation

## 2011-12-14 DIAGNOSIS — B370 Candidal stomatitis: Secondary | ICD-10-CM | POA: Insufficient documentation

## 2011-12-14 DIAGNOSIS — K259 Gastric ulcer, unspecified as acute or chronic, without hemorrhage or perforation: Secondary | ICD-10-CM | POA: Diagnosis present

## 2011-12-14 DIAGNOSIS — E118 Type 2 diabetes mellitus with unspecified complications: Secondary | ICD-10-CM | POA: Diagnosis present

## 2011-12-14 DIAGNOSIS — K5900 Constipation, unspecified: Secondary | ICD-10-CM | POA: Insufficient documentation

## 2011-12-14 LAB — GLUCOSE, CAPILLARY: Glucose-Capillary: 147 mg/dL — ABNORMAL HIGH (ref 70–99)

## 2011-12-14 LAB — COMPREHENSIVE METABOLIC PANEL
ALT: <5 U/L (ref 0–35)
Alkaline Phosphatase: 139 U/L — ABNORMAL HIGH (ref 39–117)
BUN: 26 mg/dL — ABNORMAL HIGH (ref 6–23)
CO2: 23 mEq/L (ref 19–32)
Chloride: 100 mEq/L (ref 96–112)
GFR calc Af Amer: 15 mL/min — ABNORMAL LOW (ref >90)
GFR calc non Af Amer: 13 mL/min — ABNORMAL LOW (ref >90)
Glucose, Bld: 138 mg/dL — ABNORMAL HIGH (ref 70–99)
Potassium: 5.2 mEq/L — ABNORMAL HIGH (ref 3.5–5.1)
Total Bilirubin: 0.4 mg/dL (ref 0.3–1.2)

## 2011-12-14 LAB — CBC
HCT: 24.3 % — ABNORMAL LOW (ref 36.0–46.0)
Hemoglobin: 7.6 g/dL — ABNORMAL LOW (ref 12.0–15.0)
MCHC: 31.3 g/dL (ref 30.0–36.0)
RBC: 3.2 MIL/uL — ABNORMAL LOW (ref 3.87–5.11)

## 2011-12-14 LAB — RETICULOCYTES: Retic Ct Pct: 0.8 % (ref 0.4–3.1)

## 2011-12-14 MED ORDER — NITROGLYCERIN 0.4 MG/HR TD PT24
0.4000 mg | MEDICATED_PATCH | Freq: Every day | TRANSDERMAL | Status: DC
  Filled 2011-12-14: qty 1

## 2011-12-14 MED ORDER — GABAPENTIN 100 MG PO CAPS
100.0000 mg | ORAL_CAPSULE | Freq: Two times a day (BID) | ORAL | Status: DC
  Administered 2011-12-14 – 2011-12-17 (×6): 100 mg via ORAL
  Filled 2011-12-14 (×7): qty 1

## 2011-12-14 MED ORDER — ATORVASTATIN CALCIUM 20 MG PO TABS
20.0000 mg | ORAL_TABLET | Freq: Every day | ORAL | Status: DC
  Administered 2011-12-15 – 2011-12-16 (×2): 20 mg via ORAL
  Filled 2011-12-14 (×3): qty 1

## 2011-12-14 MED ORDER — FUROSEMIDE 10 MG/ML IJ SOLN
20.0000 mg | Freq: Once | INTRAMUSCULAR | Status: AC
  Administered 2011-12-14: 20 mg via INTRAVENOUS
  Filled 2011-12-14: qty 2

## 2011-12-14 MED ORDER — ONDANSETRON HCL 4 MG PO TABS: 4.0000 mg | ORAL_TABLET | Freq: Four times a day (QID) | ORAL | Status: DC | PRN

## 2011-12-14 MED ORDER — PANTOPRAZOLE SODIUM 40 MG PO TBEC
40.0000 mg | DELAYED_RELEASE_TABLET | Freq: Every day | ORAL | Status: DC
  Administered 2011-12-15 – 2011-12-16 (×2): 40 mg via ORAL
  Filled 2011-12-14 (×2): qty 1

## 2011-12-14 MED ORDER — LORATADINE 10 MG PO TABS
10.0000 mg | ORAL_TABLET | Freq: Every day | ORAL | Status: DC | PRN
  Administered 2011-12-17: 10 mg via ORAL
  Filled 2011-12-14 (×2): qty 1

## 2011-12-14 MED ORDER — SODIUM POLYSTYRENE SULFONATE 15 GM/60ML PO SUSP
15.0000 g | Freq: Once | ORAL | Status: AC
  Administered 2011-12-14: 15 g via ORAL
  Filled 2011-12-14: qty 60

## 2011-12-14 MED ORDER — ONDANSETRON HCL 4 MG/2ML IJ SOLN: 4.0000 mg | Freq: Four times a day (QID) | INTRAMUSCULAR | Status: DC | PRN

## 2011-12-14 MED ORDER — ASPIRIN 81 MG PO CHEW
81.0000 mg | CHEWABLE_TABLET | Freq: Every day | ORAL | Status: DC
  Administered 2011-12-15 – 2011-12-17 (×3): 81 mg via ORAL
  Filled 2011-12-14 (×3): qty 1

## 2011-12-14 MED ORDER — INSULIN ASPART 100 UNIT/ML ~~LOC~~ SOLN: 0.0000 [IU] | Freq: Every day | SUBCUTANEOUS | Status: DC

## 2011-12-14 MED ORDER — ACETAMINOPHEN 650 MG RE SUPP: 650.0000 mg | Freq: Four times a day (QID) | RECTAL | Status: DC | PRN

## 2011-12-14 MED ORDER — CLOPIDOGREL BISULFATE 75 MG PO TABS
75.0000 mg | ORAL_TABLET | Freq: Every day | ORAL | Status: DC
  Administered 2011-12-15 – 2011-12-17 (×3): 75 mg via ORAL
  Filled 2011-12-14 (×3): qty 1

## 2011-12-14 MED ORDER — LEVOTHYROXINE SODIUM 50 MCG PO TABS
50.0000 ug | ORAL_TABLET | Freq: Every day | ORAL | Status: DC
  Administered 2011-12-15 – 2011-12-17 (×3): 50 ug via ORAL
  Filled 2011-12-14 (×5): qty 1

## 2011-12-14 MED ORDER — ACETAMINOPHEN 325 MG PO TABS
650.0000 mg | ORAL_TABLET | Freq: Four times a day (QID) | ORAL | Status: DC | PRN
  Administered 2011-12-15: 650 mg via ORAL
  Filled 2011-12-14: qty 2

## 2011-12-14 MED ORDER — CILOSTAZOL 100 MG PO TABS
100.0000 mg | ORAL_TABLET | Freq: Two times a day (BID) | ORAL | Status: DC
  Administered 2011-12-14 – 2011-12-17 (×6): 100 mg via ORAL
  Filled 2011-12-14 (×7): qty 1

## 2011-12-14 MED ORDER — INSULIN ASPART 100 UNIT/ML ~~LOC~~ SOLN
0.0000 [IU] | Freq: Three times a day (TID) | SUBCUTANEOUS | Status: DC
  Administered 2011-12-15: 1 [IU] via SUBCUTANEOUS
  Administered 2011-12-16: 2 [IU] via SUBCUTANEOUS

## 2011-12-14 MED ORDER — METOPROLOL TARTRATE 50 MG PO TABS
50.0000 mg | ORAL_TABLET | Freq: Two times a day (BID) | ORAL | Status: DC
  Administered 2011-12-14: 50 mg via ORAL
  Filled 2011-12-14 (×3): qty 1

## 2011-12-14 NOTE — Progress Notes (Signed)
Pt had 12 lead EKG done per MD order. EKG reviewed and WNL according to pt's baseline. Rhythm is Atrial-sensed ventricular-paced with a rate of 88 bpm. Will continue to monitor. - Christell Faith, RN

## 2011-12-14 NOTE — Progress Notes (Signed)
I spoke with Dr. Willey Blade who requested hospital admission for this patient for symptomatic anemia and blood transfusion.  Pt s an 78 year old female and has history of HTN, DM, CHF, CRF (recently worsening) who was seen by Dr. August Saucer in the clinic on 11/27 for progressive weakness.  He ordered some labs and reported that her Hg was 7.6.  He would like to have her admitted to telemetry for transfusion.    Rodney Langton, MD, CDE, FAAFP [facility] [place], [place]  Pager [phone]

## 2011-12-14 NOTE — H&P (Signed)
[facility] History and Physical  [name] WUJ:[id number] DOB: 20-Feb-1920 DOA: 12/14/2011  Referring physician: dr August Saucer PCP: August Saucer [name], MD  Specialists: Dr little   Chief Complaint: generalized weakness and was found to be anemic.   HPI: [name] is a 78 y.o. female with multiple medical problems was sent in from Dr Diamantina [place] office for symptomatic anemia with a hemoglobin of 7.6, she is being admitted for blood transfusions. She reports generalized weakness and dyspnea on exertion. Denies chest pain, malena, or hematochezia. On arrival she was found to be in acute on chronic renal failure, hyperkalemia and hyponatremia. Her diuretic dose was recently changed as her renal failure worsened.   Review of Systems: The patient denies anorexia, fever, weight loss,, vision loss, decreased hearing, hoarseness, chest pain, syncope, ,  balance deficits, hemoptysis, abdominal pain, melena, hematochezia, severe indigestion/heartburn, hematuria, incontinence, genital sores, muscle weakness, suspicious skin lesions, t, depression,, abnormal bleeding, enlarged lymph nodes, angioedema, and breast masses.    Past Medical History  Diagnosis Date  . Diabetes mellitus   . Hypertension   . MI (myocardial infarction)   . Coronary artery disease   . Legally blind    Past Surgical History  Procedure Date  . Pacemaker insertion   . Coronary artery bypass graft    Social History:  reports that she has never smoked. She has never used smokeless tobacco. She reports that she does not drink alcohol or use illicit drugs.  where does patient live--home,   Allergies  Allergen Reactions  . Darvon   . Other     Tylenol #3  . Percocet (Oxycodone-Acetaminophen)   . Percodan (Oxycodone-Aspirin)     Family History  Problem Relation Age of Onset  . Breast cancer Daughter   . Diabetes Son   . Diabetes Daughter   . Heart disease Father   . Diabetes Father   . Colon polyps Daughter      Prior to Admission medications   Medication Sig Start Date End Date Taking? Authorizing Provider  aspirin 81 MG chewable tablet Chew 81 mg by mouth daily.    Historical Provider, MD  cilostazol (PLETAL) 100 MG tablet Take 100 mg by mouth 2 (two) times daily.    Historical Provider, MD  clopidogrel (PLAVIX) 75 MG tablet Take 75 mg by mouth daily.    Historical Provider, MD  gabapentin (NEURONTIN) 100 MG capsule Take 100 mg by mouth 2 (two) times daily.     Historical Provider, MD  levothyroxine (SYNTHROID, LEVOTHROID) 50 MCG tablet Take 50 mcg by mouth daily.    Historical Provider, MD  loratadine (CLARITIN) 10 MG tablet Take 10 mg by mouth daily as needed. For allergies    Historical Provider, MD  losartan (COZAAR) 50 MG tablet Take 50 mg by mouth daily.    Historical Provider, MD  magnesium citrate SOLN Take 1 Bottle by mouth once a week.    Historical Provider, MD  metoprolol (LOPRESSOR) 50 MG tablet Take 50 mg by mouth 2 (two) times daily.    Historical Provider, MD  nitroGLYCERIN (NITRODUR - DOSED IN MG/24 HR) 0.4 mg/hr Place 1 patch onto the skin daily.    Historical Provider, MD  omeprazole (PRILOSEC) 20 MG capsule Take 20 mg by mouth daily.    Historical Provider, MD  potassium chloride SA (K-DUR,KLOR-CON) 20 MEQ tablet Take 20 mEq by mouth daily.    Historical Provider, MD  rosuvastatin (CRESTOR) 20 MG tablet Take 10 mg by mouth daily.  Historical Provider, MD  torsemide (DEMADEX) 20 MG tablet Take 20 mg by mouth daily.    Historical Provider, MD  Vitamin D, Ergocalciferol, (DRISDOL) 50000 UNITS CAPS Take 50,000 Units by mouth every 7 (seven) days. Mondays    Historical Provider, MD   Physical Exam: Filed Vitals:   12/14/11 1835  BP: 95/49  Pulse: 74  Temp: 98.1 F (36.7 C)  TempSrc: Oral  Resp: 16  SpO2: 99%    Constitutional: Vital signs reviewed.  Patient is a well-developed and well-nourished  in no acute distress and cooperative with exam. Alert and oriented x3.   Head: Normocephalic and atraumatic Mouth: no erythema or exudates, dry mm Eyes: PERRL, EOMI, pale conjuctiva Neck: Supple, Trachea midline normal ROM, No JVD, mass, thyromegaly, or carotid bruit present.  Cardiovascular: RRR, S1 normal, S2 normal, no MRG, pulses symmetric and intact bilaterally Pulmonary/Chest: CTAB, no wheezes, rales, or rhonchi Abdominal: Soft. Non-tender, non-distended, bowel sounds are normal, no masses, organomegaly, or guarding present.  Musculoskeletal: No joint deformities, erythema, or stiffness, ROM full and no nontender Hematology: no cervical, inginal, or axillary adenopathy.  Neurological: A&O x3, able to move all her extremities.  Skin: Warm, dry and intact. No rash, cyanosis, or clubbing.  Psychiatric: Normal mood and affect.  Labs on Admission:  Basic Metabolic Panel:  Lab 12/14/11 7829  NA 134*  K 5.2*  CL 100  CO2 23  GLUCOSE 138*  BUN 26*  CREATININE 2.92*  CALCIUM 9.4  MG --  PHOS --   Liver Function Tests:  Lab 12/14/11 0943  AST 17  ALT <5  ALKPHOS 139*  BILITOT 0.4  PROT 7.2  ALBUMIN 2.4*   No results found for this basename: LIPASE:5,AMYLASE:5 in the last 168 hours No results found for this basename: AMMONIA:5 in the last 168 hours CBC:  Lab 12/14/11 0943  WBC 5.3  NEUTROABS --  HGB 7.6*  HCT 24.3*  MCV 75.9*  PLT 278   Cardiac Enzymes: No results found for this basename: CKTOTAL:5,CKMB:5,CKMBINDEX:5,TROPONINI:5 in the last 168 hours  BNP (last 3 results)  Basename 11/06/11 1400  PROBNP 2645.0*   CBG: No results found for this basename: GLUCAP:5 in the last 168 hours  Radiological Exams on Admission: No results found.  EKG: pending.   Assessment/Plan Active Problems:    1. Acute on chronic renal failure; probably from over diuresis. Gentle hydration and repeat labs in am.  2. Symptomatic anemia:  Stool for occult blood negative. She had a colonoscopy 10 years ago, presumed negative.  3. CAD: resume  aspirin and plavix.  4. Diabetes mellitus: SSI.  5. Hypertension: blood pressure is borderline. We will hold the cozaar.  6. DVT prophylaxis.  7. Hyperkalemia; we will get 12 lead EKG. Stop potassium supplements. Stop cozaar. Give her kayexalate. If EKG is abnormal, we will order calcium gluconate.  8. Hyponatremia: probably from dehydration. Holding diuretic for a couple of days.    Time spent:45 min  Annette Liotta [facility] Pager [phone]  If 7PM-7AM, please contact night-coverage [email or web address] Password [facility] 12/14/2011, 6:55 PM

## 2011-12-15 ENCOUNTER — Observation Stay (HOSPITAL_COMMUNITY): Payer: Medicare Other

## 2011-12-15 DIAGNOSIS — N179 Acute kidney failure, unspecified: Secondary | ICD-10-CM

## 2011-12-15 DIAGNOSIS — N189 Chronic kidney disease, unspecified: Secondary | ICD-10-CM

## 2011-12-15 DIAGNOSIS — I959 Hypotension, unspecified: Secondary | ICD-10-CM

## 2011-12-15 LAB — PROTIME-INR
INR: 1 (ref 0.00–1.49)
Prothrombin Time: 13.1 seconds (ref 11.6–15.2)

## 2011-12-15 LAB — BASIC METABOLIC PANEL
Calcium: 9 mg/dL (ref 8.4–10.5)
GFR calc Af Amer: 17 mL/min — ABNORMAL LOW (ref >90)
GFR calc non Af Amer: 15 mL/min — ABNORMAL LOW (ref >90)
Potassium: 4.7 mEq/L (ref 3.5–5.1)
Sodium: 136 mEq/L (ref 135–145)

## 2011-12-15 LAB — CBC
Hemoglobin: 9.3 g/dL — ABNORMAL LOW (ref 12.0–15.0)
Platelets: 219 10*3/uL (ref 150–400)
RBC: 3.73 MIL/uL — ABNORMAL LOW (ref 3.87–5.11)
WBC: 6.2 10*3/uL (ref 4.0–10.5)

## 2011-12-15 LAB — IRON AND TIBC
Iron: 36 ug/dL — ABNORMAL LOW (ref 42–135)
Saturation Ratios: 19 % — ABNORMAL LOW (ref 20–55)
TIBC: 187 ug/dL — ABNORMAL LOW (ref 250–470)
UIBC: 151 ug/dL (ref 125–400)

## 2011-12-15 LAB — GLUCOSE, CAPILLARY: Glucose-Capillary: 100 mg/dL — ABNORMAL HIGH (ref 70–99)

## 2011-12-15 LAB — PRO B NATRIURETIC PEPTIDE: Pro B Natriuretic peptide (BNP): 1637 pg/mL — ABNORMAL HIGH (ref 0–450)

## 2011-12-15 LAB — FERRITIN: Ferritin: 863 ng/mL — ABNORMAL HIGH (ref 10–291)

## 2011-12-15 LAB — TSH: TSH: 0.879 u[IU]/mL (ref 0.350–4.500)

## 2011-12-15 LAB — HEMOGLOBIN A1C: Mean Plasma Glucose: 174 mg/dL — ABNORMAL HIGH (ref <117)

## 2011-12-15 MED ORDER — BIOTENE DRY MOUTH MT LIQD
15.0000 mL | Freq: Two times a day (BID) | OROMUCOSAL | Status: DC
  Administered 2011-12-16 (×2): 15 mL via OROMUCOSAL

## 2011-12-15 MED ORDER — CHLORHEXIDINE GLUCONATE 0.12 % MT SOLN
15.0000 mL | Freq: Two times a day (BID) | OROMUCOSAL | Status: DC
  Administered 2011-12-16 – 2011-12-17 (×3): 15 mL via OROMUCOSAL
  Filled 2011-12-15 (×7): qty 15

## 2011-12-15 MED ORDER — POLYETHYLENE GLYCOL 3350 17 G PO PACK
17.0000 g | PACK | Freq: Every day | ORAL | Status: DC
  Administered 2011-12-15 – 2011-12-17 (×3): 17 g via ORAL
  Filled 2011-12-15 (×3): qty 1

## 2011-12-15 MED ORDER — FLUCONAZOLE 50 MG PO TABS
50.0000 mg | ORAL_TABLET | Freq: Every day | ORAL | Status: DC
  Administered 2011-12-15 – 2011-12-17 (×3): 50 mg via ORAL
  Filled 2011-12-15 (×3): qty 1

## 2011-12-15 MED ORDER — SODIUM CHLORIDE 0.9 % IV SOLN
INTRAVENOUS | Status: DC
  Administered 2011-12-15 – 2011-12-16 (×2): via INTRAVENOUS

## 2011-12-15 NOTE — Progress Notes (Signed)
INITIAL ADULT NUTRITION ASSESSMENT Date: 12/15/2011   Time: 2:30 PM Reason for Assessment: MST  ASSESSMENT: Female 78 y.o.  Dx: anemia  Hx:  Past Medical History  Diagnosis Date  . Diabetes mellitus   . Hypertension   . MI (myocardial infarction)   . Coronary artery disease   . Legally blind    Past Surgical History  Procedure Date  . Pacemaker insertion   . Coronary artery bypass graft     Related Meds:  Scheduled Meds:   . aspirin  81 mg Oral Daily  . atorvastatin  20 mg Oral q1800  . cilostazol  100 mg Oral BID  . clopidogrel  75 mg Oral Daily  . fluconazole  50 mg Oral Daily  . [COMPLETED] furosemide  20 mg Intravenous Once  . gabapentin  100 mg Oral BID  . insulin aspart  0-5 Units Subcutaneous QHS  . insulin aspart  0-9 Units Subcutaneous TID WC  . levothyroxine  50 mcg Oral QAC breakfast  . pantoprazole  40 mg Oral Daily  . polyethylene glycol  17 g Oral Daily  . [COMPLETED] sodium polystyrene  15 g Oral Once  . [DISCONTINUED] metoprolol  50 mg Oral BID  . [DISCONTINUED] nitroGLYCERIN  0.4 mg Transdermal Daily   Continuous Infusions:   . sodium chloride 75 mL/hr at 12/15/11 1105   PRN Meds:.acetaminophen, acetaminophen, loratadine, ondansetron (ZOFRAN) IV, ondansetron   Ht: 5\' 4"  (162.6 cm)  Wt: 84.4 kg  Ideal Wt: 54.5 % Ideal Wt: 154%  Usual Wt: 210 lbs per family % Usual Wt: 88%  Body mass index is 34.97 kg/(m^2).  Food/Nutrition Related Hx: wt loss per family, recently stopped insulin because of wt loss  Labs:  CMP     Component Value Date/Time   NA 136 12/15/2011 0650   K 4.7 12/15/2011 0650   CL 102 12/15/2011 0650   CO2 23 12/15/2011 0650   GLUCOSE 103* 12/15/2011 0650   BUN 26* 12/15/2011 0650   CREATININE 2.69* 12/15/2011 0650   CALCIUM 9.0 12/15/2011 0650   PROT 7.2 12/14/2011 0943   ALBUMIN 2.4* 12/14/2011 0943   AST 17 12/14/2011 0943   ALT <5 12/14/2011 0943   ALKPHOS 139* 12/14/2011 0943   BILITOT 0.4 12/14/2011  0943   GFRNONAA 15* 12/15/2011 0650   GFRAA 17* 12/15/2011 0650    CBC    Component Value Date/Time   WBC 6.2 12/15/2011 0650   RBC 3.73* 12/15/2011 0650   HGB 9.3* 12/15/2011 0650   HCT 28.7* 12/15/2011 0650   PLT 219 12/15/2011 0650   MCV 76.9* 12/15/2011 0650   MCH 24.9* 12/15/2011 0650   MCHC 32.4 12/15/2011 0650   RDW 17.3* 12/15/2011 0650   LYMPHSABS 1.6 11/06/2011 1400   MONOABS 0.7 11/06/2011 1400   EOSABS 0.0 11/06/2011 1400   BASOSABS 0.0 11/06/2011 1400    Intake: 50-100% Output:   Intake/Output Summary (Last 24 hours) at 12/15/11 1433 Last data filed at 12/15/11 1300  Gross per 24 hour  Intake   1206 ml  Output      0 ml  Net   1206 ml   Last BM (11/26)  Diet Order: General  Supplements/Tube Feeding: none at this time  IVF:    sodium chloride Last Rate: 75 mL/hr at 12/15/11 1105    Estimated Nutritional Needs:   Kcal: 1680-1850 Protein: 67-84g Fluid: >1.8 L/day  Pt admitted with anemia.  Pt sleeping at time of visit.  Nutrition hx obtained from family  at bedside who report pt with decreased intake at home.  They state pt recently stopped insulin due to wt loss.  Last HgBA1C is elevated, however family states that pt stopped due to wt loss, wt loss did not occur after stopping insulin.  Lab Results  Component Value Date   HGBA1C 7.7* 12/14/2011   Pt does have oral thrush for which she is being treated which has allowed increased intake to 50-100% of meals at this time.  Family reports 20 lbs wt loss.  RD to follow for continued assessment and nutritional adequacy.  Family reports pt does not like supplements- will try [employer] for pt.  NUTRITION DIAGNOSIS: Unintended wt loss  RELATED TO: unknown etiology, decreased intake per family  AS EVIDENCE BY: pt with reported 20 lbs wt loss  MONITORING/EVALUATION(Goals): 1.  Food/Beverage; pt to continue with 50-100% of meals consumed. 2.  Wt/wt change; deter further  loss  EDUCATION NEEDS: -Education needs addressed with family  INTERVENTION: 1.  Supplements; carnation instant breakfast BID with meals.   DOCUMENTATION CODES Per approved criteria  -Not Applicable    Loyce Dys [facility] 12/15/2011, 2:30 PM

## 2011-12-15 NOTE — Progress Notes (Signed)
[facility] PROGRESS NOTE  [name]:[id number] DOB: 30-Apr-1920 DOA: 12/14/2011 PCP: August Saucer [name], MD  Assessment/Plan: Active Problems:  Microcytic anemia  Dyspepsia and other specified disorders of function of stomach  Diabetes mellitus  Coronary artery disease  Hypotension  Acute on chronic renal failure  Pacemaker  Hyperkalemia  Hyponatremia    1. Acute on chronic renal failure: Patient has known CKD 3-4, with baseline creatinine of 1.67-2.34 in 08/21/11-11/17/11. She presented with creatinine of 2.92, BUN 26, consistent with acute-on-chronic renal failure, probably from over diuresis. Managing with gentle iv fluids, following renal indices.  2. Hyperkalemia: This was mild. 12 lead EKG showed paced complexes. Potassium supplements and ARB have been discontinued, and patient given kayexalate. Potassium has normalized.  3. Symptomatic anemia: HB was 7.6 at presentation, and FOBT was negative. She had a colonoscopy 10 years ago, presumed negative. Fortunately, HB is 9.3 today.  4. CAD: Patient is s/p CABG. Stable/Asymptomatic. On Aspirin and Plavix.  5. Diabetes mellitus: Probably diet-controlled. Not on diabetic medications, pre-admission. Managing with SSI. HBA1C is 7.7 CBGs are reasonable at this time.  6. Hypertension: Blood pressure is borderline-low. Antihypertensives and nitrates are on hold. On iv fluids. Will observe. 7. Hyponatremia: Probably from dehydration. Holding diuretic. Resolved.  8. Oral thrush: Patient complained of abnormal taste in the mouth. [name] was found on physical examination. Placed on 7-day course of Diflucan.  9. Constipation. Per patient , no bowel movement for the pasty one week. Will commence bowel regimen.  10: Hypothyroidism: On thyroxine replacement therapy.   Code Status: Full Code.  Family Communication:  Disposition Plan: To be determined.    Brief narrative: 78 y.o. female with known history of DM-2, HTN, CAD, s/p MI, s/p  CABG, s/p PPM, hypothyroidism, dyslipidemia, legal blindness, sent in from Dr Diamantina [place] office for symptomatic anemia with a hemoglobin of 7.6, generalized weakness and dyspnea on exertion, without chest pain, malena, or hematochezia. On arrival, she was found to be in acute on chronic renal failure, hyperkalemic and hyponatremia. She was admitted for further management.   Consultants:  N/A.   Procedures:  N/A.   Antibiotics:  N/A.   HPI/Subjective: C/O abnormal taste in mouth. Constipated for the past one week.   Objective: Vital signs in last 24 hours: Temp:  [97.7 F (36.5 C)-98.7 F (37.1 C)] 98.7 F (37.1 C) (11/29 0430) Pulse Rate:  [74-87] 77  (11/29 0528) Resp:  [16-18] 16  (11/29 0528) BP: (95-153)/(49-96) 98/49 mmHg (11/29 0528) SpO2:  [99 %-100 %] 99 % (11/29 0528) Weight:  [84.4 kg (186 lb 1.1 oz)-92.4 kg (203 lb 11.3 oz)] 92.4 kg (203 lb 11.3 oz) (11/29 0500) Weight change:  Last BM Date: 12/12/11  Intake/Output from previous day: 11/28 0701 - 11/29 0700 In: 546 [Blood:546] Out: -      Physical Exam: General: Comfortable, alert, communicative, fully oriented, not short of breath at rest.  HEENT:  Mild clinical pallor (After blood transfusion), no jaundice, no conjunctival injection or discharge. Has oral thrush. NECK:  Supple, JVP not seen, no carotid bruits, no palpable lymphadenopathy, no palpable goiter. CHEST:  Clinically clear to auscultation, no wheezes, no crackles. HEART:  Sounds 1 and 2 heard, normal, regular, no murmurs. ABDOMEN:  Moderately obese, soft, non-tender, no palpable organomegaly, no palpable masses, normal bowel sounds. GENITALIA:  Not examined. LOWER EXTREMITIES:  No pitting edema, palpable peripheral pulses. MUSCULOSKELETAL SYSTEM:  Generalized osteoarthritic changes, otherwise, normal. CENTRAL NERVOUS SYSTEM:  No focal neurologic deficit on gross examination.  Lab Results:  [facility] 12/15/11 0650 12/14/11 0943  WBC 6.2 5.3  HGB  9.3* 7.6*  HCT 28.7* 24.3*  PLT 219 278    Basename 12/15/11 0650 12/14/11 0943  NA 136 134*  K 4.7 5.2*  CL 102 100  CO2 23 23  GLUCOSE 103* 138*  BUN 26* 26*  CREATININE 2.69* 2.92*  CALCIUM 9.0 9.4   No results found for this or any previous visit (from the past 240 hour(s)).   Studies/Results: No results found.  Medications: Scheduled Meds:   . aspirin  81 mg Oral Daily  . atorvastatin  20 mg Oral q1800  . cilostazol  100 mg Oral BID  . clopidogrel  75 mg Oral Daily  . [COMPLETED] furosemide  20 mg Intravenous Once  . gabapentin  100 mg Oral BID  . insulin aspart  0-5 Units Subcutaneous QHS  . insulin aspart  0-9 Units Subcutaneous TID WC  . levothyroxine  50 mcg Oral QAC breakfast  . metoprolol  50 mg Oral BID  . nitroGLYCERIN  0.4 mg Transdermal Daily  . pantoprazole  40 mg Oral Daily  . [COMPLETED] sodium polystyrene  15 g Oral Once   Continuous Infusions:  PRN Meds:.acetaminophen, acetaminophen, loratadine, ondansetron (ZOFRAN) IV, ondansetron    LOS: 1 day   [name],CHRISTOPHER  [facility] Pager [phone]. If 8PM-8AM, please contact night-coverage at [email or web address], password [facility] 12/15/2011, 9:16 AM  LOS: 1 day

## 2011-12-15 NOTE — Progress Notes (Signed)
Patient with low blood pressure, was 98/48 then now 106/52 and have lopressor and nitroglycerin patch due now . Notified and stated to hold the lopressor and  Nitro and started patient patient on IV fluid as well. Will continue to assess patient.

## 2011-12-16 DIAGNOSIS — E871 Hypo-osmolality and hyponatremia: Secondary | ICD-10-CM

## 2011-12-16 LAB — CBC
HCT: 26.9 % — ABNORMAL LOW (ref 36.0–46.0)
MCV: 77.5 fL — ABNORMAL LOW (ref 78.0–100.0)
RDW: 17.6 % — ABNORMAL HIGH (ref 11.5–15.5)
WBC: 6.1 10*3/uL (ref 4.0–10.5)

## 2011-12-16 LAB — RENAL FUNCTION PANEL
Albumin: 1.8 g/dL — ABNORMAL LOW (ref 3.5–5.2)
BUN: 22 mg/dL (ref 6–23)
CO2: 21 mEq/L (ref 19–32)
Chloride: 102 mEq/L (ref 96–112)
Creatinine, Ser: 2.54 mg/dL — ABNORMAL HIGH (ref 0.50–1.10)

## 2011-12-16 LAB — URINALYSIS, ROUTINE W REFLEX MICROSCOPIC
Nitrite: POSITIVE — AB
Protein, ur: 30 mg/dL — AB
Specific Gravity, Urine: 1.013 (ref 1.005–1.030)
Urobilinogen, UA: 1 mg/dL (ref 0.0–1.0)

## 2011-12-16 LAB — URINE MICROSCOPIC-ADD ON

## 2011-12-16 LAB — TYPE AND SCREEN
ABO/RH(D): O POS
Unit division: 0
Unit division: 0

## 2011-12-16 LAB — GLUCOSE, CAPILLARY
Glucose-Capillary: 114 mg/dL — ABNORMAL HIGH (ref 70–99)
Glucose-Capillary: 103 mg/dL — ABNORMAL HIGH (ref 70–99)
Glucose-Capillary: 116 mg/dL — ABNORMAL HIGH (ref 70–99)
Glucose-Capillary: 177 mg/dL — ABNORMAL HIGH (ref 70–99)

## 2011-12-16 MED ORDER — PANTOPRAZOLE SODIUM 40 MG PO TBEC
40.0000 mg | DELAYED_RELEASE_TABLET | Freq: Two times a day (BID) | ORAL | Status: DC
  Administered 2011-12-16 – 2011-12-17 (×2): 40 mg via ORAL
  Filled 2011-12-16 (×4): qty 1

## 2011-12-16 MED ORDER — MAGIC MOUTHWASH
5.0000 mL | Freq: Four times a day (QID) | ORAL | Status: DC | PRN
  Filled 2011-12-16: qty 5

## 2011-12-16 NOTE — Progress Notes (Signed)
[facility] PROGRESS NOTE  [name] WUJ:[id number] DOB: Mar 16, 1920 DOA: 12/14/2011 PCP: August Saucer [name], MD  Assessment/Plan: Active Problems:  Microcytic anemia  Dyspepsia and other specified disorders of function of stomach  Diabetes mellitus  Coronary artery disease  Hypotension  Acute on chronic renal failure  Pacemaker  Hyperkalemia  Hyponatremia  Fever    1. Acute on chronic renal failure: Patient has known CKD 3-4, with baseline creatinine of 1.67-2.34 in 08/21/11-11/17/11. She presented with creatinine of 2.92, BUN 26, consistent with acute-on-chronic renal failure, probably from over diuresis. Managing with gentle iv fluids, following renal indices.  2. Hyperkalemia: This was mild. 12 lead EKG showed paced complexes. Potassium supplements and ARB have been discontinued, and patient given kayexalate. Potassium has normalized.  3. Symptomatic anemia: HB was 7.6 at presentation, and FOBT was negative. She had a colonoscopy 10 years ago, presumed negative. Fortunately, HB is stable today 4. CAD: Patient is s/p CABG. Stable/Asymptomatic. On Aspirin and Plavix.  5. Diabetes mellitus: Probably diet-controlled. Not on diabetic medications, pre-admission. Managing with SSI. HBA1C is 7.7 CBGs are reasonable at this time.  6. Hypertension: Blood pressure is borderline-low. Antihypertensives and nitrates are on hold. On iv fluids. Will observe. 7. Hyponatremia: Probably from dehydration. Holding diuretic. Resolved.  8. Oral thrush: Patient complained of abnormal taste in the mouth. [name] was found on physical examination. Placed on 7-day course of Diflucan.  9. Constipation. Per patient , no bowel movement for the pasty one week. Will commence bowel regimen.  10: Hypothyroidism: On thyroxine replacement therapy.  11: Fever: Patient spiked a fever on 11/29. She was pancultured. So far everything is negative. If no further fever and hemoglobin stable will discharge  tomorrow.  Code Status: Full Code.  Family Communication:  Disposition Plan: To be determined.    Brief narrative: 78 y.o. female with known history of DM-2, HTN, CAD, s/p MI, s/p CABG, s/p PPM, hypothyroidism, dyslipidemia, legal blindness, sent in from Dr Diamantina [place] office for symptomatic anemia with a hemoglobin of 7.6, generalized weakness and dyspnea on exertion, without chest pain, malena, or hematochezia. On arrival, she was found to be in acute on chronic renal failure, hyperkalemic and hyponatremia. She was admitted for further management.   Consultants:  N/A.   Procedures:  N/A.   Antibiotics:  N/A.   HPI/Subjective: C/O abnormal taste in mouth. Otherwise she feels OK. Daughter is at the bedside.  Objective: Vital signs in last 24 hours: Temp:  [98.6 F (37 C)-101.3 F (38.5 C)] 98.6 F (37 C) (11/30 0500) Pulse Rate:  [74-86] 74  (11/30 0500) Resp:  [18-20] 18  (11/30 0500) BP: (92-124)/(41-59) 93/41 mmHg (11/30 0500) SpO2:  [100 %] 100 % (11/30 0500) Weight:  [94.7 kg (208 lb 12.4 oz)] 94.7 kg (208 lb 12.4 oz) (11/30 0634) Weight change: 10.3 kg (22 lb 11.3 oz) Last BM Date: 12/12/11  Intake/Output from previous day: 11/29 0701 - 11/30 0700 In: 2678.8 [P.O.:1110; I.V.:1568.8] Out: -      Physical Exam: General: Comfortable, alert, communicative, fully oriented, not short of breath at rest.  HEENT:  Mild clinical pallor (After blood transfusion), no jaundice, no conjunctival injection or discharge. Has oral thrush. NECK:  Supple, JVP not seen, no carotid bruits, no palpable lymphadenopathy, no palpable goiter. CHEST:  Clinically clear to auscultation, no wheezes, no crackles. HEART:  Sounds 1 and 2 heard, normal, regular, no murmurs. ABDOMEN:  Moderately obese, soft, non-tender, no palpable organomegaly, no palpable masses, normal bowel sounds. GENITALIA:  Not  examined. LOWER EXTREMITIES:  No pitting edema, palpable peripheral pulses. MUSCULOSKELETAL  SYSTEM:  Generalized osteoarthritic changes, otherwise, normal. CENTRAL NERVOUS SYSTEM:  No focal neurologic deficit on gross examination.  Lab Results:  Basename 12/16/11 0430 12/15/11 0650  WBC 6.1 6.2  HGB 8.6* 9.3*  HCT 26.9* 28.7*  PLT 195 219    Basename 12/16/11 0430 12/15/11 0650  NA 133* 136  K 4.2 4.7  CL 102 102  CO2 21 23  GLUCOSE 127* 103*  BUN 22 26*  CREATININE 2.54* 2.69*  CALCIUM 8.1* 9.0   No results found for this or any previous visit (from the past 240 hour(s)).   Studies/Results: Dg Chest Port 1 View  12/15/2011  *RADIOLOGY REPORT*  Clinical Data: Fever.  PORTABLE CHEST - 1 VIEW  Comparison: 09/12/2011  Findings: Stable appearance of dual chamber pacemaker.  Lungs show no evidence of pulmonary edema or focal consolidation.  There is stable elevation of the right hemidiaphragm.  Heart size is stable status post prior CABG.  No significant pleural effusions are identified.  IMPRESSION: No active disease.   Original Report Authenticated By: Irish Lack, M.D.     Medications: Scheduled Meds:    . antiseptic oral rinse  15 mL Mouth Rinse q12n4p  . aspirin  81 mg Oral Daily  . atorvastatin  20 mg Oral q1800  . chlorhexidine  15 mL Mouth Rinse BID  . cilostazol  100 mg Oral BID  . clopidogrel  75 mg Oral Daily  . fluconazole  50 mg Oral Daily  . gabapentin  100 mg Oral BID  . insulin aspart  0-5 Units Subcutaneous QHS  . insulin aspart  0-9 Units Subcutaneous TID WC  . levothyroxine  50 mcg Oral QAC breakfast  . pantoprazole  40 mg Oral BID AC  . polyethylene glycol  17 g Oral Daily  . [DISCONTINUED] pantoprazole  40 mg Oral Daily   Continuous Infusions:    . sodium chloride 75 mL/hr at 12/16/11 0040   PRN Meds:.acetaminophen, acetaminophen, loratadine, ondansetron (ZOFRAN) IV, ondansetron, [DISCONTINUED] magic mouthwash    LOS: 2 days   Jaramiah Bossard JARRETT  [facility] Pager [phone]. If 8PM-8AM, please contact night-coverage at  [email or web address], password [facility] 12/16/2011, 11:43 AM  LOS: 2 days

## 2011-12-17 ENCOUNTER — Encounter (HOSPITAL_COMMUNITY): Payer: Self-pay | Admitting: *Deleted

## 2011-12-17 ENCOUNTER — Inpatient Hospital Stay (HOSPITAL_COMMUNITY): Payer: Medicare Other

## 2011-12-17 ENCOUNTER — Inpatient Hospital Stay (HOSPITAL_COMMUNITY)
Admission: EM | Admit: 2011-12-17 | Discharge: 2011-12-26 | DRG: 872 | Disposition: A | Discharge status: Home Health | Payer: Medicare Other | Attending: Internal Medicine | Admitting: Internal Medicine

## 2011-12-17 ENCOUNTER — Emergency Department (HOSPITAL_COMMUNITY): Payer: Medicare Other

## 2011-12-17 DIAGNOSIS — G8929 Other chronic pain: Secondary | ICD-10-CM

## 2011-12-17 DIAGNOSIS — D509 Iron deficiency anemia, unspecified: Secondary | ICD-10-CM

## 2011-12-17 DIAGNOSIS — E875 Hyperkalemia: Secondary | ICD-10-CM

## 2011-12-17 DIAGNOSIS — I2510 Atherosclerotic heart disease of native coronary artery without angina pectoris: Secondary | ICD-10-CM

## 2011-12-17 DIAGNOSIS — E871 Hypo-osmolality and hyponatremia: Secondary | ICD-10-CM

## 2011-12-17 DIAGNOSIS — K3189 Other diseases of stomach and duodenum: Secondary | ICD-10-CM

## 2011-12-17 DIAGNOSIS — A411 Sepsis due to other specified staphylococcus: Secondary | ICD-10-CM

## 2011-12-17 DIAGNOSIS — E119 Type 2 diabetes mellitus without complications: Secondary | ICD-10-CM

## 2011-12-17 DIAGNOSIS — N390 Urinary tract infection, site not specified: Secondary | ICD-10-CM

## 2011-12-17 DIAGNOSIS — E118 Type 2 diabetes mellitus with unspecified complications: Secondary | ICD-10-CM | POA: Diagnosis present

## 2011-12-17 DIAGNOSIS — R630 Anorexia: Secondary | ICD-10-CM

## 2011-12-17 DIAGNOSIS — I959 Hypotension, unspecified: Secondary | ICD-10-CM

## 2011-12-17 DIAGNOSIS — Z950 Presence of cardiac pacemaker: Secondary | ICD-10-CM

## 2011-12-17 DIAGNOSIS — A499 Bacterial infection, unspecified: Secondary | ICD-10-CM

## 2011-12-17 DIAGNOSIS — K259 Gastric ulcer, unspecified as acute or chronic, without hemorrhage or perforation: Secondary | ICD-10-CM

## 2011-12-17 DIAGNOSIS — B958 Unspecified staphylococcus as the cause of diseases classified elsewhere: Secondary | ICD-10-CM

## 2011-12-17 DIAGNOSIS — I10 Essential (primary) hypertension: Secondary | ICD-10-CM

## 2011-12-17 DIAGNOSIS — D638 Anemia in other chronic diseases classified elsewhere: Secondary | ICD-10-CM

## 2011-12-17 DIAGNOSIS — R634 Abnormal weight loss: Secondary | ICD-10-CM

## 2011-12-17 DIAGNOSIS — Z8719 Personal history of other diseases of the digestive system: Secondary | ICD-10-CM

## 2011-12-17 DIAGNOSIS — N179 Acute kidney failure, unspecified: Secondary | ICD-10-CM

## 2011-12-17 DIAGNOSIS — R112 Nausea with vomiting, unspecified: Secondary | ICD-10-CM

## 2011-12-17 DIAGNOSIS — R5383 Other fatigue: Secondary | ICD-10-CM

## 2011-12-17 HISTORY — DX: Personal history of other diseases of the digestive system: Z87.19

## 2011-12-17 LAB — GLUCOSE, CAPILLARY: Glucose-Capillary: 114 mg/dL — ABNORMAL HIGH (ref 70–99)

## 2011-12-17 LAB — URINE MICROSCOPIC-ADD ON

## 2011-12-17 LAB — CBC WITH DIFFERENTIAL/PLATELET
Basophils Absolute: 0 10*3/uL (ref 0.0–0.1)
Basophils Relative: 0 % (ref 0–1)
Eosinophils Absolute: 0 10*3/uL (ref 0.0–0.7)
HCT: 28.8 % — ABNORMAL LOW (ref 36.0–46.0)
Hemoglobin: 9.3 g/dL — ABNORMAL LOW (ref 12.0–15.0)
MCH: 25 pg — ABNORMAL LOW (ref 26.0–34.0)
MCHC: 32.3 g/dL (ref 30.0–36.0)
Monocytes Absolute: 0.8 10*3/uL (ref 0.1–1.0)
Neutro Abs: 6.5 10*3/uL (ref 1.7–7.7)
RDW: 17.8 % — ABNORMAL HIGH (ref 11.5–15.5)

## 2011-12-17 LAB — SAMPLE TO BLOOD BANK

## 2011-12-17 LAB — COMPREHENSIVE METABOLIC PANEL
Alkaline Phosphatase: 121 U/L — ABNORMAL HIGH (ref 39–117)
BUN: 18 mg/dL (ref 6–23)
Calcium: 8.6 mg/dL (ref 8.4–10.5)
Creatinine, Ser: 2.1 mg/dL — ABNORMAL HIGH (ref 0.50–1.10)
GFR calc Af Amer: 23 mL/min — ABNORMAL LOW (ref >90)
Glucose, Bld: 165 mg/dL — ABNORMAL HIGH (ref 70–99)
Total Protein: 6.2 g/dL (ref 6.0–8.3)

## 2011-12-17 LAB — BASIC METABOLIC PANEL
BUN: 18 mg/dL (ref 6–23)
Chloride: 101 mEq/L (ref 96–112)
Creatinine, Ser: 2.03 mg/dL — ABNORMAL HIGH (ref 0.50–1.10)
GFR calc Af Amer: 24 mL/min — ABNORMAL LOW (ref >90)
GFR calc non Af Amer: 21 mL/min — ABNORMAL LOW (ref >90)
Potassium: 4 mEq/L (ref 3.5–5.1)

## 2011-12-17 LAB — URINALYSIS, ROUTINE W REFLEX MICROSCOPIC
Ketones, ur: NEGATIVE mg/dL
Nitrite: POSITIVE — AB
Specific Gravity, Urine: 1.015 (ref 1.005–1.030)
pH: 6 (ref 5.0–8.0)

## 2011-12-17 LAB — CBC
MCHC: 32.4 g/dL (ref 30.0–36.0)
Platelets: 202 10*3/uL (ref 150–400)
RDW: 17.6 % — ABNORMAL HIGH (ref 11.5–15.5)
WBC: 5.8 10*3/uL (ref 4.0–10.5)

## 2011-12-17 LAB — POCT I-STAT TROPONIN I: Troponin i, poc: 0.01 ng/mL (ref 0.00–0.08)

## 2011-12-17 LAB — LACTIC ACID, PLASMA: Lactic Acid, Venous: 1.5 mmol/L (ref 0.5–2.2)

## 2011-12-17 MED ORDER — GABAPENTIN 100 MG PO CAPS
100.0000 mg | ORAL_CAPSULE | Freq: Two times a day (BID) | ORAL | Status: DC
  Administered 2011-12-17 – 2011-12-26 (×18): 100 mg via ORAL
  Filled 2011-12-17 (×20): qty 1

## 2011-12-17 MED ORDER — INSULIN ASPART 100 UNIT/ML ~~LOC~~ SOLN
0.0000 [IU] | Freq: Three times a day (TID) | SUBCUTANEOUS | Status: DC
  Administered 2011-12-18: 3 [IU] via SUBCUTANEOUS

## 2011-12-17 MED ORDER — PANTOPRAZOLE SODIUM 40 MG PO TBEC
40.0000 mg | DELAYED_RELEASE_TABLET | Freq: Every day | ORAL | Status: DC
  Administered 2011-12-18 – 2011-12-25 (×9): 40 mg via ORAL
  Filled 2011-12-17 (×9): qty 1

## 2011-12-17 MED ORDER — INSULIN ASPART 100 UNIT/ML ~~LOC~~ SOLN: 0.0000 [IU] | Freq: Every day | SUBCUTANEOUS | Status: DC

## 2011-12-17 MED ORDER — LORATADINE 10 MG PO TABS
10.0000 mg | ORAL_TABLET | Freq: Every day | ORAL | Status: DC | PRN
  Administered 2011-12-19: 10 mg via ORAL
  Filled 2011-12-17: qty 1

## 2011-12-17 MED ORDER — LEVOFLOXACIN IN D5W 500 MG/100ML IV SOLN
500.0000 mg | Freq: Once | INTRAVENOUS | Status: DC
  Filled 2011-12-17: qty 100

## 2011-12-17 MED ORDER — ONDANSETRON HCL 4 MG PO TABS: 4.0000 mg | ORAL_TABLET | Freq: Four times a day (QID) | ORAL | Status: DC | PRN

## 2011-12-17 MED ORDER — VITAMIN D (ERGOCALCIFEROL) 1.25 MG (50000 UNIT) PO CAPS
50000.0000 [IU] | ORAL_CAPSULE | ORAL | Status: DC
  Administered 2011-12-18 – 2011-12-25 (×2): 50000 [IU] via ORAL
  Filled 2011-12-17 (×2): qty 1

## 2011-12-17 MED ORDER — LEVOFLOXACIN 250 MG PO TABS: 250.0000 mg | ORAL_TABLET | Freq: Every day | ORAL | Status: DC

## 2011-12-17 MED ORDER — DARBEPOETIN ALFA-POLYSORBATE 40 MCG/0.4ML IJ SOLN
40.0000 ug | Freq: Once | INTRAMUSCULAR | Status: AC
  Administered 2011-12-17: 40 ug via SUBCUTANEOUS
  Filled 2011-12-17: qty 0.4

## 2011-12-17 MED ORDER — ATORVASTATIN CALCIUM 20 MG PO TABS
20.0000 mg | ORAL_TABLET | Freq: Every day | ORAL | Status: DC
  Administered 2011-12-18 – 2011-12-26 (×9): 20 mg via ORAL
  Filled 2011-12-17 (×10): qty 1

## 2011-12-17 MED ORDER — ONDANSETRON HCL 4 MG/2ML IJ SOLN: 4.0000 mg | Freq: Four times a day (QID) | INTRAMUSCULAR | Status: DC | PRN

## 2011-12-17 MED ORDER — PIPERACILLIN-TAZOBACTAM 3.375 G IVPB
3.3750 g | Freq: Three times a day (TID) | INTRAVENOUS | Status: DC
  Administered 2011-12-17 – 2011-12-19 (×6): 3.375 g via INTRAVENOUS
  Filled 2011-12-17 (×7): qty 50

## 2011-12-17 MED ORDER — ACETAMINOPHEN 325 MG PO TABS
650.0000 mg | ORAL_TABLET | Freq: Four times a day (QID) | ORAL | Status: DC | PRN
  Administered 2011-12-17 – 2011-12-26 (×4): 650 mg via ORAL
  Filled 2011-12-17 (×5): qty 2

## 2011-12-17 MED ORDER — SODIUM CHLORIDE 0.9 % IV SOLN
INTRAVENOUS | Status: DC
  Administered 2011-12-17 – 2011-12-26 (×9): via INTRAVENOUS

## 2011-12-17 MED ORDER — NYSTATIN 100000 UNIT/ML MT SUSP: 500000.0000 [IU] | Freq: Four times a day (QID) | OROMUCOSAL | Status: AC

## 2011-12-17 MED ORDER — LEVOFLOXACIN 500 MG PO TABS
500.0000 mg | ORAL_TABLET | Freq: Every day | ORAL | Status: DC
  Administered 2011-12-17: 500 mg via ORAL
  Filled 2011-12-17: qty 1

## 2011-12-17 MED ORDER — LEVOTHYROXINE SODIUM 50 MCG PO TABS
50.0000 ug | ORAL_TABLET | Freq: Every day | ORAL | Status: DC
  Administered 2011-12-18 – 2011-12-26 (×9): 50 ug via ORAL
  Filled 2011-12-17 (×12): qty 1

## 2011-12-17 MED ORDER — ACETAMINOPHEN 650 MG RE SUPP: 650.0000 mg | Freq: Four times a day (QID) | RECTAL | Status: DC | PRN

## 2011-12-17 MED ORDER — NYSTATIN 100000 UNIT/ML MT SUSP
500000.0000 [IU] | Freq: Four times a day (QID) | OROMUCOSAL | Status: DC
  Administered 2011-12-17 – 2011-12-26 (×26): 500000 [IU] via ORAL
  Filled 2011-12-17 (×37): qty 5

## 2011-12-17 MED ORDER — SODIUM CHLORIDE 0.9 % IV BOLUS (SEPSIS)
500.0000 mL | Freq: Once | INTRAVENOUS | Status: AC
  Administered 2011-12-17: 500 mL via INTRAVENOUS

## 2011-12-17 MED ORDER — ASPIRIN 81 MG PO CHEW
81.0000 mg | CHEWABLE_TABLET | Freq: Every day | ORAL | Status: DC
  Administered 2011-12-18 – 2011-12-26 (×9): 81 mg via ORAL
  Filled 2011-12-17 (×9): qty 1

## 2011-12-17 MED ORDER — CLOPIDOGREL BISULFATE 75 MG PO TABS
75.0000 mg | ORAL_TABLET | Freq: Every day | ORAL | Status: DC
  Administered 2011-12-18 – 2011-12-26 (×9): 75 mg via ORAL
  Filled 2011-12-17 (×9): qty 1

## 2011-12-17 MED ORDER — CILOSTAZOL 100 MG PO TABS
100.0000 mg | ORAL_TABLET | Freq: Two times a day (BID) | ORAL | Status: DC
  Administered 2011-12-17 – 2011-12-26 (×18): 100 mg via ORAL
  Filled 2011-12-17 (×19): qty 1

## 2011-12-17 NOTE — H&P (Signed)
History and Physical  ARRIONA PREST OZH:[id number] DOB: 1920-12-22 DOA: 12/14/2011  Referring physician: Freida Busman PCP: August Saucer [name], MD   Chief Complaint: Vomiting dizziness and chest tightness.  HPI:  Patient is 78 year old African American female that was admitted 2 days ago from Dr. Diamantina [facility] office with anemia for blood transfusion. While she was inpatient she developed a fever and was noted to have a bladder infection. Her blood pressure was also running low. She felt good on 12/1 and was discharged home with by mouth Levaquin to take and follow up with Dr. August Saucer this week. Patient given a dose of Levaquin prior to discharge. She stated that when she got home she got  lightheaded and dizzy with some chest tightness and nausea vomiting once. She thinks that this happened secondary to taking all her medications on empty stomach. She normally takes her medication after she eats. EMS was called. Per family when EMS got there her blood pressure was in the 90s systolic and her pulse ox was 62% on room air. She was brought back to the emergency room. In the emergency room her blood pressure remained in the 90s systolic to low 100s without fluids heart rate 108 paced rhythm and pulse ox 96% on room air. Patient stated that she felt fine now. She had a chest x-ray done that was normal cardiac markers were normal, lab work showed no significant change, EKG shows a paced rhythm. She stated that she started feeling bad right after she took the Levaquin. Patient is being admitted for further treatment.  Patient stated that she has had a headache and dizziness in the past she was worked up by neurology last year with no significant finding. Chart Review:  Chart Reviewed  Review of Systems:  Negative otherwise stated in history of present illness  Past Medical History  Diagnosis Date  . Diabetes mellitus   . Hypertension   . MI (myocardial infarction)   . Coronary artery disease   . Legally blind      Past Surgical History  Procedure Date  . Pacemaker insertion   . Coronary artery bypass graft     Social History:  reports that she has never smoked. She has never used smokeless tobacco. She reports that she does not drink alcohol or use illicit drugs.she lives with her daughter. She has 6 children.  Allergies  Allergen Reactions  . Darvon   . Other     Tylenol #3  . Percocet (Oxycodone-Acetaminophen)   . Percodan (Oxycodone-Aspirin)     Family History  Problem Relation Age of Onset  . Breast cancer Daughter   . Diabetes Son   . Diabetes Daughter   . Heart disease Father   . Diabetes Father   . Colon polyps Daughter      Prior to Admission medications   Medication Sig Start Date End Date Taking? Authorizing Provider  aspirin 81 MG chewable tablet Chew 81 mg by mouth daily.   Yes Historical Provider, MD  cilostazol (PLETAL) 100 MG tablet Take 100 mg by mouth 2 (two) times daily.   Yes Historical Provider, MD  clopidogrel (PLAVIX) 75 MG tablet Take 75 mg by mouth daily.   Yes Historical Provider, MD  gabapentin (NEURONTIN) 100 MG capsule Take 100 mg by mouth 2 (two) times daily.    Yes Historical Provider, MD  levothyroxine (SYNTHROID, LEVOTHROID) 50 MCG tablet Take 50 mcg by mouth daily.   Yes Historical Provider, MD  loratadine (CLARITIN) 10 MG tablet Take 10 mg  by mouth daily as needed. For allergies   Yes Historical Provider, MD  magnesium citrate SOLN Take 1 Bottle by mouth once a week.   Yes Historical Provider, MD  metoprolol (LOPRESSOR) 50 MG tablet Take 25 mg by mouth 2 (two) times daily. Take 0.5 tablet (25 mg) twice daily   Yes Historical Provider, MD  omeprazole (PRILOSEC) 20 MG capsule Take 20 mg by mouth daily.   Yes Historical Provider, MD  rosuvastatin (CRESTOR) 20 MG tablet Take 10 mg by mouth daily.   Yes Historical Provider, MD  Vitamin D, Ergocalciferol, (DRISDOL) 50000 UNITS CAPS Take 50,000 Units by mouth every 7 (seven) days. Mondays   Yes  Historical Provider, MD  levofloxacin (LEVAQUIN) 250 MG tablet Take 1 tablet (250 mg total) by mouth daily. 12/17/11   Juan Olthoff Adelina Mings, MD  nystatin (MYCOSTATIN) 100000 UNIT/ML suspension Take 5 mLs (500,000 Units total) by mouth 4 (four) times daily. 12/17/11 12/21/11  Alinda Money, MD   Physical Exam: Filed Vitals:   12/16/11 1610 12/16/11 1300 12/16/11 2048 12/17/11 0623  BP:  106/56 124/59 95/32  Pulse:  77 65 93  Temp:  98.7 F (37.1 C) 100.2 F (37.9 C) 98.7 F (37.1 C)  TempSrc:   Oral Oral  Resp:  21 20 20   Height:      Weight: 94.7 kg (208 lb 12.4 oz)   96.2 kg (212 lb 1.3 oz)  SpO2:  100% 97% 96%     General: Patient is lying on the stretcher   HEENT: Head normocephalic atraumatic, pupils reactive to light.  Cardiovascular: Tachycardic 1/6 systolic murmur  Respiratory: Clear to auscultation bilaterally. No wheezes rhonchi or rales   Abdomen: Soft obese nontender positive bowel sounds  Skin: Intact  Musculoskeletal: Strength 5/5 throughout  Psychiatric: Normal mood  Neurologic: Patient is blind in the right eye. No focal deficit.  Wt Readings from Last 3 Encounters:  12/17/11 96.2 kg (212 lb 1.3 oz)  10/19/11 88.814 kg (195 lb 12.8 oz)  04/23/11 101.152 kg (223 lb)    Labs on Admission:  Basic Metabolic Panel:  Lab 12/17/11 9604 12/17/11 0420 12/16/11 0430 12/15/11 0650 12/14/11 0943  NA 133* 130* 133* 136 134*  K 4.1 4.0 4.2 4.7 5.2*  CL 102 101 102 102 100  CO2 21 21 21 23 23   GLUCOSE 165* 119* 127* 103* 138*  BUN 18 18 22  26* 26*  CREATININE 2.10* 2.03* 2.54* 2.69* 2.92*  CALCIUM 8.6 7.9* 8.1* 9.0 9.4  MG -- -- -- -- --  PHOS -- -- 2.9 -- --    Liver Function Tests:  Lab 12/17/11 1603 12/16/11 0430 12/14/11 0943  AST 14 -- 17  ALT <5 -- <5  ALKPHOS 121* -- 139*  BILITOT 0.6 -- 0.4  PROT 6.2 -- 7.2  ALBUMIN 2.1* 1.8* 2.4*    CBC:  Lab 12/17/11 1603 12/17/11 0420 12/16/11 0430 12/15/11 0650 12/14/11 0943  WBC 8.2 5.8 6.1 6.2 5.3   NEUTROABS 6.5 -- -- -- --  HGB 9.3* 8.2* 8.6* 9.3* 7.6*  HCT 28.8* 25.3* 26.9* 28.7* 24.3*  MCV 77.4* 76.9* 77.5* 76.9* 75.9*  PLT 219 202 195 219 278      Troponin (Point of Care Test)  [facility] 12/17/11 1616  TROPIPOC 0.01    BNP (last 3 results)  Basename 12/17/11 1603 12/15/11 0650 11/06/11 1400  PROBNP 2026.0* 1637.0* 2645.0*    CBG:  Lab 12/17/11 0738 12/16/11 2052 12/16/11 1704 12/16/11 1159 12/16/11 0736  GLUCAP  114* 169* 177* 116* 103*     Radiological Exams on Admission: Dg Chest Port 1 View  12/17/2011  *RADIOLOGY REPORT*  Clinical Data: Shortness of breath, pain, weakness, and vomiting  PORTABLE CHEST - 1 VIEW  Comparison: Chest radiograph 12/15/2011  Findings: Stable cardiomegaly in this patient status post median sternotomy for CABG.  Left chest wall dual lead pacer appears stable.  Lung volumes are low.  Stable elevation of the right hemidiaphragm.  The lungs appear clear.  No visible pleural effusion or pneumothorax. Visualized upper abdomen is unremarkable.  IMPRESSION: Stable chest radiograph.  No acute cardiopulmonary disease identified.   Original Report Authenticated By: Britta Mccreedy, M.D.    Dg Chest Port 1 View  12/15/2011  *RADIOLOGY REPORT*  Clinical Data: Fever.  PORTABLE CHEST - 1 VIEW  Comparison: 09/12/2011  Findings: Stable appearance of dual chamber pacemaker.  Lungs show no evidence of pulmonary edema or focal consolidation.  There is stable elevation of the right hemidiaphragm.  Heart size is stable status post prior CABG.  No significant pleural effusions are identified.  IMPRESSION: No active disease.   Original Report Authenticated By: Irish Lack, M.D.     EKG: Independently reviewed. Paced rhythm     Assessment/Plan Microcytic anemia Patient microcytic anemia is probably related to anemia of chronic disease from her renal insufficiency. She was admitted and transfused 2 units of blood. Hemoglobin has remained stable. She was guaiac  negative. Per discussion with daughter Dr. August Saucer arranged for them to get area next shot routinely for her anemia. She had a colonoscopy about 10 years ago. She had received a dose of sinus prior to being discharged today. Will continue to monitor hemoglobin. Also send for stool guaiac.   Dyspepsia and other specified disorders of function of stomach Continue Prilosec   Diabetes mellitus Monitor blood sugars and cover with sliding scale if sugars are running over 140.   Coronary artery disease /Pacemaker/Chest Pain Patient stated that earlier today she had some chest tightness. Cardiac markers are negative. Will cycle cardiac markers and check 2-D echo. Will also check d-dimer..  Hypotension Patient blood pressure was running low during this admission. Blood pressure systolic was 80s and 90s. Her antihypertensive medication were placed on hold. She had chest x-ray urinalysis and blood cultures done. Urine grew out staph. Patient was initially discharged on Levaquin. Patient will be started on Zosyn IV. No sign of active bleeding. She has no abdominal pain, stool is guaiac negative.   Chronic renal failure Patient's creatinine seem to be at baseline. Will monitor creatinine.   Hyponatremia Mild. Monitor.  Foot pain: Patient complains of some pain in her right foot. Will get x-ray of the foot. Does not seem to have any erythema no increased warmth. She does have some tenderness.  Acute cystitis Last urinalysis grew out staph. Will start patient on Zosyn for broad coverage and send urine for sensitivities. Patient could have had a reaction to the Levaquin. She stated that it was after taking the Levaquin she developed symptoms of chest tightness shortness of breath with nausea vomiting.  Thrush Continue nystatin mouthwash  Code Status: Full code Family Communication: Discussed with patient's daughter and other family members.  Disposition Plan/Anticipated LOS:  24 hours Time spent:70 min  minutes  Molli Posey, MD  [facility] Team 5 Pager [phone]. If 7PM-7AM, please contact night-coverage at [email or web address], password [facility] 12/17/2011, 5:26 PM

## 2011-12-17 NOTE — ED Notes (Signed)
Per [facility], pt went home from hospital, was very weak and c/o shortness of breath. Was discharged today from [facility] today at approximately noon. Pt was initially WNL with BP, then  hypotensive upon further EMS assessment; not positional. No syncope. Pt threw up after taking her meds, family called EMS at this time. Pt received 3 units of blodo while inpatient this admission (ending today).

## 2011-12-17 NOTE — Discharge Summary (Signed)
Physician Discharge Summary  [name] ZOX:[id number] DOB: May 17, 1920 DOA: 12/14/2011  PCP: Carrie Blade, MD  Admit date: 12/14/2011 Discharge date: 12/17/2011  Recommendations for Outpatient Follow-up:   Follow-up Information    Follow up with [name], [name], MD. In 1 week.   Contact information:   [address] [place] [address] [phone]         Discharge Diagnoses:   Microcytic anemia  Dyspepsia and other specified disorders of function of stomach  Diabetes mellitus  Coronary artery disease  Hypotension  Acute on chronic renal failure  Pacemaker  Hyperkalemia  Hyponatremia Urinary tract infection   Discharge Condition: Stable Disposition: Home with Daughter  Diet recommendation: Heart Healthy  Filed Weights   12/15/11 0500 12/16/11 0634 12/17/11 0865  Weight: 92.4 kg (203 lb 11.3 oz) 94.7 kg (208 lb 12.4 oz) 96.2 kg (212 lb 1.3 oz)    History of present illness:  [name] is a 78 y.o. female with multiple medical problems was sent in from Dr [name] office for symptomatic anemia with a hemoglobin of 7.6, she is being admitted for blood transfusions. She reports generalized weakness and dyspnea on exertion. Denies chest pain, malena, or hematochezia. On arrival she was found to be in acute on chronic renal failure, hyperkalemia and hyponatremia. Her diuretic dose was recently changed as her renal failure worsened.    Hospital Course:  1. Acute on chronic renal failure: Patient has known CKD 3-4, with baseline creatinine of 1.67-2.34 in 08/21/11-11/17/11. She presented with creatinine of 2.92, BUN 26, consistent with acute-on-chronic renal failure, probably from over diuresis. Managing with gentle iv fluids, following renal indices. In Creatinine improved to baseline prior to discharge.  2. Hyperkalemia: This was mild. 12 lead EKG showed paced complexes. Potassium supplements and ARB have been discontinued, and patient given kayexalate. Potassium  has normalized. At the time of discharge patient had normal potassium. 3. Symptomatic anemia: HB was 7.6 at presentation, and FOBT was negative. She had a colonoscopy 10 years ago, presumed negative. She was transfused 2 units of blood. Hemoglobin remained stable. 4. CAD: Patient is s/p CABG. Stable/Asymptomatic. On Aspirin and Plavix.  5. Diabetes mellitus: Probably diet-controlled. Not on diabetic medications, pre-admission. Managing with SSI. HBA1C is 7.7 CBGs are reasonable at this time.  6. Hypertension/hypotension: Blood pressure is borderline-low. Antihypertensives and nitrates were held. Blood pressure can be reevaluated on followup and medications restarted.  Patient's blood pressure remained low during this hospitalization. 7. Hyponatremia: Probably from dehydration.  Resolved.  8. Oral thrush: Patient complained of abnormal taste in the mouth. [name] was found on physical examination. Placed on 2-days of Diflucan. Discharged on nystatin 4 times a day. 9. Constipation. Per patient , no bowel movement for the pasty one week. Patient was told to continue her bowel regimen at the time of discharge 10: Hypothyroidism: On thyroxine replacement therapy.  11: Fever: Patient spiked a fever on 11/29. She was pancultured. Urine grew out staph. She will be discharged on Levaquin. She will followup with this week with Dr. August Torres  12: UTI: Urine culture grew out staph.  Patient presently afebrile. No leukocytosis. In Will discharge her on Levaquin daily. She will follow up with Dr. August Torres this week for sensitivities of the urine culture.  13: Foot pain : Patient stated that since she's been in the hospital she have some pain on the dorsum of her right foot. She was told if no improvement follow up with Dr. August Torres. She has no  erythema or significant tenderness or swelling of her foot.      Discharge Instructions  Discharge Orders    Future Orders Please Complete By Expires   Diet - low sodium heart healthy       Increase activity slowly          Medication List     As of 12/17/2011 10:31 AM    STOP taking these medications         losartan 50 MG tablet   Commonly known as: COZAAR      nitroGLYCERIN 0.4 mg/hr   Commonly known as: NITRODUR - Dosed in mg/24 hr      potassium chloride SA 20 MEQ tablet   Commonly known as: K-DUR,KLOR-CON      torsemide 10 MG tablet   Commonly known as: DEMADEX      TAKE these medications         aspirin 81 MG chewable tablet   Chew 81 mg by mouth daily.      cilostazol 100 MG tablet   Commonly known as: PLETAL   Take 100 mg by mouth 2 (two) times daily.      clopidogrel 75 MG tablet   Commonly known as: PLAVIX   Take 75 mg by mouth daily.      gabapentin 100 MG capsule   Commonly known as: NEURONTIN   Take 100 mg by mouth 2 (two) times daily.      levothyroxine 50 MCG tablet   Commonly known as: SYNTHROID, LEVOTHROID   Take 50 mcg by mouth daily.      loratadine 10 MG tablet   Commonly known as: CLARITIN   Take 10 mg by mouth daily as needed. For allergies      magnesium citrate Soln   Take 1 Bottle by mouth once a week.      metoprolol 50 MG tablet   Commonly known as: LOPRESSOR   Take 25 mg by mouth 2 (two) times daily. Take 0.5 tablet (25 mg) twice daily      nystatin 100000 UNIT/ML suspension   Commonly known as: MYCOSTATIN   Take 5 mLs (500,000 Units total) by mouth 4 (four) times daily.      omeprazole 20 MG capsule   Commonly known as: PRILOSEC   Take 20 mg by mouth daily.      rosuvastatin 20 MG tablet   Commonly known as: CRESTOR   Take 10 mg by mouth daily.      Vitamin D (Ergocalciferol) 50000 UNITS Caps   Commonly known as: DRISDOL   Take 50,000 Units by mouth every 7 (seven) days. Mondays           Follow-up Information    Follow up with [name], [name], MD. In 1 week.   Contact information:   [address] [place] [address] [phone]           The results of significant diagnostics  from this hospitalization (including imaging, microbiology, ancillary and laboratory) are listed below for reference.    Significant Diagnostic Studies: Dg Chest Port 1 View  12/15/2011  *RADIOLOGY REPORT*  Clinical Data: Fever.  PORTABLE CHEST - 1 VIEW  Comparison: 09/12/2011  Findings: Stable appearance of dual chamber pacemaker.  Lungs show no evidence of pulmonary edema or focal consolidation.  There is stable elevation of the right hemidiaphragm.  Heart size is stable status post prior CABG.  No significant pleural effusions are identified.  IMPRESSION: No active disease.  Original Report Authenticated By: Irish Lack, M.D.     Microbiology: Recent Results (from the past 240 hour(s))  URINE CULTURE     Status: Normal (Preliminary result)   Collection Time   12/16/11 11:06 AM      Component Value Range Status Comment   Specimen Description URINE, CLEAN CATCH   Final    Special Requests NONE   Final    Culture  Setup Time 12/16/2011 13:04   Final    Colony Count >=100,000 COLONIES/ML   Final    Culture     Final    Value: STAPHYLOCOCCUS SPECIES (COAGULASE NEGATIVE)     Note: RIFAMPIN AND GENTAMICIN SHOULD NOT BE USED AS SINGLE DRUGS FOR TREATMENT OF STAPH INFECTIONS.   Report Status PENDING   Incomplete      Labs: Basic Metabolic Panel:  Lab 12/17/11 1478 12/16/11 0430 12/15/11 0650 12/14/11 0943  NA 130* 133* 136 134*  K 4.0 4.2 4.7 5.2*  CL 101 102 102 100  CO2 21 21 23 23   GLUCOSE 119* 127* 103* 138*  BUN 18 22 26* 26*  CREATININE 2.03* 2.54* 2.69* 2.92*  CALCIUM 7.9* 8.1* 9.0 9.4  MG -- -- -- --  PHOS -- 2.9 -- --   Liver Function Tests:  Lab 12/16/11 0430 12/14/11 0943  AST -- 17  ALT -- <5  ALKPHOS -- 139*  BILITOT -- 0.4  PROT -- 7.2  ALBUMIN 1.8* 2.4*   No results found for this basename: LIPASE:5,AMYLASE:5 in the last 168 hours No results found for this basename: AMMONIA:5 in the last 168 hours CBC:  Lab 12/17/11 0420 12/16/11 0430 12/15/11 0650  12/14/11 0943  WBC 5.8 6.1 6.2 5.3  NEUTROABS -- -- -- --  HGB 8.2* 8.6* 9.3* 7.6*  HCT 25.3* 26.9* 28.7* 24.3*  MCV 76.9* 77.5* 76.9* 75.9*  PLT 202 195 219 278   Cardiac Enzymes: No results found for this basename: CKTOTAL:5,CKMB:5,CKMBINDEX:5,TROPONINI:5 in the last 168 hours BNP: BNP (last 3 results)  Basename 12/15/11 0650 11/06/11 1400  PROBNP 1637.0* 2645.0*   CBG:  Lab 12/17/11 0738 12/16/11 2052 12/16/11 1704 12/16/11 1159 12/16/11 0736  GLUCAP 114* 169* 177* 116* 103*      Time coordinating discharge:70 min  Signed:  Molli Posey, MD [facility] 12/17/2011, 10:31 AM

## 2011-12-17 NOTE — Progress Notes (Signed)
ANTIBIOTIC CONSULT NOTE - INITIAL  Pharmacy Consult for Zosyn Indication: rule out pneumonia  Allergies  Allergen Reactions  . Darvon   . Other     Tylenol #3  . Percocet (Oxycodone-Acetaminophen)   . Percodan (Oxycodone-Aspirin)    Patient Measurements: Height: 5\' 4"  (162.6 cm) Weight: 212 lb 1.3 oz (96.2 kg) IBW/kg (Calculated) : 54.7   Vital Signs: Temp: 98.8 F (37.1 C) (12/01 1727) Temp src: Oral (12/01 1727) BP: 97/55 mmHg (12/01 1727) Pulse Rate: 108  (12/01 1456) Intake/Output from previous day: 11/30 0701 - 12/01 0700 In: 1650 [P.O.:600; I.V.:1050] Out: 400 [Urine:400] Intake/Output from this shift:    Labs:  [facility] 12/17/11 1603 12/17/11 0420 12/16/11 0430  WBC 8.2 5.8 6.1  HGB 9.3* 8.2* 8.6*  PLT 219 202 195  LABCREA -- -- --  CREATININE 2.10* 2.03* 2.54*   Estimated Creatinine Clearance: 20.4 ml/min (by C-G formula based on Cr of 2.1). No results found for this basename: VANCOTROUGH:2,VANCOPEAK:2,VANCORANDOM:2,GENTTROUGH:2,GENTPEAK:2,GENTRANDOM:2,TOBRATROUGH:2,TOBRAPEAK:2,TOBRARND:2,AMIKACINPEAK:2,AMIKACINTROU:2,AMIKACIN:2, in the last 72 hours   Microbiology: Recent Results (from the past 720 hour(s))  CULTURE, BLOOD (ROUTINE X 2)     Status: Normal (Preliminary result)   Collection Time   12/16/11 12:05 AM      Component Value Range Status Comment   Specimen Description BLOOD RIGHT HAND   Final    Special Requests BOTTLES DRAWN AEROBIC AND ANAEROBIC 10CC   Final    Culture  Setup Time 12/16/2011 03:04   Final    Culture     Final    Value:        BLOOD CULTURE RECEIVED NO GROWTH TO DATE CULTURE WILL BE HELD FOR 5 DAYS BEFORE ISSUING A FINAL NEGATIVE REPORT   Report Status PENDING   Incomplete   CULTURE, BLOOD (ROUTINE X 2)     Status: Normal (Preliminary result)   Collection Time   12/16/11 12:12 AM      Component Value Range Status Comment   Specimen Description BLOOD RIGHT ARM   Final    Special Requests BOTTLES DRAWN AEROBIC AND ANAEROBIC  10C   Final    Culture  Setup Time 12/16/2011 03:04   Final    Culture     Final    Value:        BLOOD CULTURE RECEIVED NO GROWTH TO DATE CULTURE WILL BE HELD FOR 5 DAYS BEFORE ISSUING A FINAL NEGATIVE REPORT   Report Status PENDING   Incomplete   URINE CULTURE     Status: Normal (Preliminary result)   Collection Time   12/16/11 11:06 AM      Component Value Range Status Comment   Specimen Description URINE, CLEAN CATCH   Final    Special Requests NONE   Final    Culture  Setup Time 12/16/2011 13:04   Final    Colony Count >=100,000 COLONIES/ML   Final    Culture     Final    Value: STAPHYLOCOCCUS SPECIES (COAGULASE NEGATIVE)     Note: RIFAMPIN AND GENTAMICIN SHOULD NOT BE USED AS SINGLE DRUGS FOR TREATMENT OF STAPH INFECTIONS.   Report Status PENDING   Incomplete     Medical History: Past Medical History  Diagnosis Date  . Diabetes mellitus   . Hypertension   . MI (myocardial infarction)   . Coronary artery disease   . Legally blind    Medications:  Anti-infectives     Start     Dose/Rate Route Frequency Ordered Stop   12/17/11 1200   levofloxacin (  LEVAQUIN) tablet 500 mg  Status:  Discontinued        500 mg Oral Daily 12/17/11 1055 12/17/11 1441   12/17/11 1100   levofloxacin (LEVAQUIN) IVPB 500 mg  Status:  Discontinued        500 mg 100 mL/hr over 60 Minutes Intravenous  Once 12/17/11 1042 12/17/11 1055   12/17/11 0000   levofloxacin (LEVAQUIN) 250 MG tablet        250 mg Oral Daily 12/17/11 1043     12/15/11 1030   fluconazole (DIFLUCAN) tablet 50 mg  Status:  Discontinued        50 mg Oral Daily 12/15/11 1004 12/17/11 1441         Assessment:  [name] discharged earlier today. Brought back via EMS with SOB and hypotension.   Starting Zosyn.  Hx CKD, SCr 2.1(reportedly close to baseline), CrCl ~13ml/min. Borderline for using extended-infusion.  Goal of Therapy:  Eradication of infection. Appropriate dosing.  Plan:  Zosyn 3.375g IV Q8H infused over  4hrs. F/u daily.  Charolotte Eke, PharmD, pager [phone]. 12/17/2011,5:43 PM.

## 2011-12-17 NOTE — ED Provider Notes (Signed)
History     CSN: [id number]  Arrival date & time 12/17/11  1441   First MD Initiated Contact with Patient 12/17/11 1520      Chief Complaint  Patient presents with  . Shortness of Breath  . Hypotension    (Consider location/radiation/quality/duration/timing/severity/associated sxs/prior treatment) Patient is a 78 y.o. female presenting with shortness of breath. The history is provided by the patient and a relative.  Shortness of Breath  Associated symptoms include shortness of breath.   patient here with weakness and shortness of breath after being discharged from hospital today after a stay for symptomatic anemia. She was transfused with 3 units of blood. Was also diagnosed with a UTI during her admission and discharged on Levaquin. Upon arriving at home, she felt lightheaded and dizzy. EMS was called patient found hypotensive. She did have one episode of emesis prior to arrival. Blood pressure was in the 80s and apparently she suffered from hypertension during her admission. No blood noted in emesis. IV access unobtainable by ems.  Past Medical History  Diagnosis Date  . Diabetes mellitus   . Hypertension   . MI (myocardial infarction)   . Coronary artery disease   . Legally blind     Past Surgical History  Procedure Date  . Pacemaker insertion   . Coronary artery bypass graft     Family History  Problem Relation Age of Onset  . Breast cancer Daughter   . Diabetes Son   . Diabetes Daughter   . Heart disease Father   . Diabetes Father   . Colon polyps Daughter     History  Substance Use Topics  . Smoking status: Never Smoker   . Smokeless tobacco: Never Used  . Alcohol Use: No    OB History    Grav Para Term Preterm Abortions TAB SAB Ect Mult Living                  Review of Systems  Respiratory: Positive for shortness of breath.   All other systems reviewed and are negative.    Allergies  Darvon; Other; Percocet; and Percodan  Home Medications    Current Outpatient Rx  Name  Route  Sig  Dispense  Refill  . ASPIRIN 81 MG PO CHEW   Oral   Chew 81 mg by mouth daily.         Marland Kitchen CILOSTAZOL 100 MG PO TABS   Oral   Take 100 mg by mouth 2 (two) times daily.         Marland Kitchen CLOPIDOGREL BISULFATE 75 MG PO TABS   Oral   Take 75 mg by mouth daily.         Marland Kitchen GABAPENTIN 100 MG PO CAPS   Oral   Take 100 mg by mouth 2 (two) times daily.          Marland Kitchen LEVOFLOXACIN 250 MG PO TABS   Oral   Take 1 tablet (250 mg total) by mouth daily.   5 tablet   0   . LEVOTHYROXINE SODIUM 50 MCG PO TABS   Oral   Take 50 mcg by mouth daily.         Marland Kitchen LORATADINE 10 MG PO TABS   Oral   Take 10 mg by mouth daily as needed. For allergies         . MAGNESIUM CITRATE PO SOLN   Oral   Take 1 Bottle by mouth once a week.         Marland Kitchen  METOPROLOL TARTRATE 50 MG PO TABS   Oral   Take 25 mg by mouth 2 (two) times daily. Take 0.5 tablet (25 mg) twice daily         . NYSTATIN 100000 UNIT/ML MT SUSP   Oral   Take 5 mLs (500,000 Units total) by mouth 4 (four) times daily.   60 mL   0   . OMEPRAZOLE 20 MG PO CPDR   Oral   Take 20 mg by mouth daily.         Marland Kitchen ROSUVASTATIN CALCIUM 20 MG PO TABS   Oral   Take 10 mg by mouth daily.         Marland Kitchen VITAMIN D (ERGOCALCIFEROL) 50000 UNITS PO CAPS   Oral   Take 50,000 Units by mouth every 7 (seven) days. Mondays           BP 96/54  Pulse 108  Temp 98.7 F (37.1 C) (Oral)  Resp 18  SpO2 98%  Physical Exam  Nursing note and vitals reviewed. Constitutional: She is oriented to person, place, and time. She appears well-developed and well-nourished.  Non-toxic appearance. She has a sickly appearance. She appears ill. No distress.  HENT:  Head: Normocephalic and atraumatic.  Eyes: Conjunctivae normal, EOM and lids are normal. Pupils are equal, round, and reactive to light.  Neck: Normal range of motion. Neck supple. No tracheal deviation present. No mass present.  Cardiovascular: Normal rate,  regular rhythm and normal heart sounds.  Exam reveals no gallop.   No murmur heard. Pulmonary/Chest: Effort normal and breath sounds normal. No stridor. No respiratory distress. She has no decreased breath sounds. She has no wheezes. She has no rhonchi. She has no rales.  Abdominal: Soft. Normal appearance and bowel sounds are normal. She exhibits no distension. There is no tenderness. There is no rebound and no CVA tenderness.  Musculoskeletal: Normal range of motion. She exhibits no edema and no tenderness.  Neurological: She is alert and oriented to person, place, and time. She has normal strength. No cranial nerve deficit or sensory deficit. GCS eye subscore is 4. GCS verbal subscore is 5. GCS motor subscore is 6.  Skin: Skin is warm and dry. No abrasion and no rash noted.  Psychiatric: She has a normal mood and affect. Her speech is normal and behavior is normal.    ED Course  Procedures (including critical care time)   Labs Reviewed  CBC WITH DIFFERENTIAL  COMPREHENSIVE METABOLIC PANEL  URINALYSIS, ROUTINE W REFLEX MICROSCOPIC  URINE CULTURE  SAMPLE TO BLOOD BANK  PRO B NATRIURETIC PEPTIDE   Dg Chest Port 1 View  12/15/2011  *RADIOLOGY REPORT*  Clinical Data: Fever.  PORTABLE CHEST - 1 VIEW  Comparison: 09/12/2011  Findings: Stable appearance of dual chamber pacemaker.  Lungs show no evidence of pulmonary edema or focal consolidation.  There is stable elevation of the right hemidiaphragm.  Heart size is stable status post prior CABG.  No significant pleural effusions are identified.  IMPRESSION: No active disease.   Original Report Authenticated By: Irish Lack, M.D.      No diagnosis found.    MDM  Spoke with [facility], will come to re-admit--iv fluids ordered and labs pending        Toy Baker, MD 12/17/11 [id number]

## 2011-12-17 NOTE — Progress Notes (Signed)
ANTIBIOTIC CONSULT NOTE - INITIAL  Pharmacy Consult for Zosyn Indication: rule out pneumonia  Allergies  Allergen Reactions  . Darvon   . Other     Tylenol #3  . Percocet (Oxycodone-Acetaminophen)   . Percodan (Oxycodone-Aspirin)     Patient Measurements:   Adjusted Body Weight:  Vital Signs: Temp: 98.8 F (37.1 C) (12/01 1727) Temp src: Oral (12/01 1727) BP: 97/55 mmHg (12/01 1727) Pulse Rate: 108  (12/01 1456) Intake/Output from previous day:   Intake/Output from this shift:    Labs:  Basename 12/17/11 1603 12/17/11 0420 12/16/11 0430  WBC 8.2 5.8 6.1  HGB 9.3* 8.2* 8.6*  PLT 219 202 195  LABCREA -- -- --  CREATININE 2.10* 2.03* 2.54*   The CrCl is unknown because both a height and weight (above a minimum accepted value) are required for this calculation. No results found for this basename: VANCOTROUGH:2,VANCOPEAK:2,VANCORANDOM:2,GENTTROUGH:2,GENTPEAK:2,GENTRANDOM:2,TOBRATROUGH:2,TOBRAPEAK:2,TOBRARND:2,AMIKACINPEAK:2,AMIKACINTROU:2,AMIKACIN:2, in the last 72 hours   Microbiology: Recent Results (from the past 720 hour(s))  CULTURE, BLOOD (ROUTINE X 2)     Status: Normal (Preliminary result)   Collection Time   12/16/11 12:05 AM      Component Value Range Status Comment   Specimen Description BLOOD RIGHT HAND   Final    Special Requests BOTTLES DRAWN AEROBIC AND ANAEROBIC 10CC   Final    Culture  Setup Time 12/16/2011 03:04   Final    Culture     Final    Value:        BLOOD CULTURE RECEIVED NO GROWTH TO DATE CULTURE WILL BE HELD FOR 5 DAYS BEFORE ISSUING A FINAL NEGATIVE REPORT   Report Status PENDING   Incomplete   CULTURE, BLOOD (ROUTINE X 2)     Status: Normal (Preliminary result)   Collection Time   12/16/11 12:12 AM      Component Value Range Status Comment   Specimen Description BLOOD RIGHT ARM   Final    Special Requests BOTTLES DRAWN AEROBIC AND ANAEROBIC 10C   Final    Culture  Setup Time 12/16/2011 03:04   Final    Culture     Final    Value:         BLOOD CULTURE RECEIVED NO GROWTH TO DATE CULTURE WILL BE HELD FOR 5 DAYS BEFORE ISSUING A FINAL NEGATIVE REPORT   Report Status PENDING   Incomplete   URINE CULTURE     Status: Normal (Preliminary result)   Collection Time   12/16/11 11:06 AM      Component Value Range Status Comment   Specimen Description URINE, CLEAN CATCH   Final    Special Requests NONE   Final    Culture  Setup Time 12/16/2011 13:04   Final    Colony Count >=100,000 COLONIES/ML   Final    Culture     Final    Value: STAPHYLOCOCCUS SPECIES (COAGULASE NEGATIVE)     Note: RIFAMPIN AND GENTAMICIN SHOULD NOT BE USED AS SINGLE DRUGS FOR TREATMENT OF STAPH INFECTIONS.   Report Status PENDING   Incomplete     Medical History: Past Medical History  Diagnosis Date  . Diabetes mellitus   . Hypertension   . MI (myocardial infarction)   . Coronary artery disease   . Legally blind    Assessment:  [name] discharged earlier today. Brought back via EMS with SOB and hypotension.   Starting Zosyn.  Hx CKD, SCr 2.1(reportedly close to baseline), CrCl ~54ml/min. Borderline for using extended-infusion.  Goal of Therapy:  Eradication of infection. Appropriate dosing.  Plan:  Zosyn 3.375g IV Q8H infused over 4hrs. F/u daily.  Charolotte Eke, PharmD, pager [phone]. 12/17/2011,5:49 PM.

## 2011-12-17 NOTE — ED Notes (Signed)
Called [facility] for dr Freida Busman...klj

## 2011-12-18 ENCOUNTER — Encounter (HOSPITAL_COMMUNITY): Payer: Self-pay | Admitting: Cardiology

## 2011-12-18 DIAGNOSIS — N390 Urinary tract infection, site not specified: Secondary | ICD-10-CM

## 2011-12-18 DIAGNOSIS — A499 Bacterial infection, unspecified: Secondary | ICD-10-CM

## 2011-12-18 DIAGNOSIS — I959 Hypotension, unspecified: Secondary | ICD-10-CM

## 2011-12-18 DIAGNOSIS — B958 Unspecified staphylococcus as the cause of diseases classified elsewhere: Secondary | ICD-10-CM | POA: Insufficient documentation

## 2011-12-18 DIAGNOSIS — A411 Sepsis due to other specified staphylococcus: Secondary | ICD-10-CM

## 2011-12-18 DIAGNOSIS — B9689 Other specified bacterial agents as the cause of diseases classified elsewhere: Secondary | ICD-10-CM

## 2011-12-18 LAB — BASIC METABOLIC PANEL
Chloride: 103 mEq/L (ref 96–112)
Creatinine, Ser: 2.13 mg/dL — ABNORMAL HIGH (ref 0.50–1.10)
GFR calc Af Amer: 23 mL/min — ABNORMAL LOW (ref >90)
GFR calc non Af Amer: 19 mL/min — ABNORMAL LOW (ref >90)
Potassium: 4 mEq/L (ref 3.5–5.1)

## 2011-12-18 LAB — GLUCOSE, CAPILLARY
Glucose-Capillary: 152 mg/dL — ABNORMAL HIGH (ref 70–99)
Glucose-Capillary: 94 mg/dL (ref 70–99)

## 2011-12-18 LAB — URINE CULTURE

## 2011-12-18 MED ORDER — MAGNESIUM CITRATE PO SOLN
1.0000 | Freq: Once | ORAL | Status: AC
  Administered 2011-12-18: 1 via ORAL

## 2011-12-18 NOTE — Consult Note (Signed)
Reason for Consult: Hypotension and CHF and chest tightness  Referring Physician: Dr. Nolene Bernheim Manukyan is an 78 y.o. female.    Chief Complaint:  Admitted 12/17/11 with dizziness and chest tightness  HPI: 78 y.o. AAF with a history significant for CAD. She underwent bypass surgery in 1994. Last cardiac catheterization was in 2008 that showed her grafts to be patent and her LV function to be normal. In 2008 she developed complete heart block, which again, prompted a cath that showed grafts to be patent. A permanent pacemaker was placed at that time. She was last seen in clinic by Dr. Clarene Duke on 07/10/11 and was found to have appropriate pacer function with four years of battery life.  She was admitted 2 days ago from Dr. Diamantina Providence office with anemia for blood transfusion. While she was inpatient she developed a fever and was noted to have a bladder infection. Her blood pressure was also running low. She felt good on 12/1 and was discharged home with by mouth Levaquin to take and follow up with Dr. August Saucer this week. Patient given a dose of Levaquin prior to discharge. She stated that when she got home she got lightheaded and dizzy with some chest tightness and nausea vomiting once. She thinks that this happened secondary to taking all her medications on empty stomach. She normally takes her medication after she eats. EMS was called. Per family when EMS got there her blood pressure was in the 90s systolic and her pulse ox was 16% on room air. She was brought back to the emergency room 12/17/11.  In the emergency room her blood pressure remained in the 90s systolic to low 100s without fluids heart rate 108 paced rhythm and pulse ox 96% on room air. Patient stated that she felt fine now. She had a chest x-ray done that was normal cardiac markers were normal, lab work showed no significant change, EKG shows a paced rhythm. She stated that she started feeling bad right after she took the Levaquin. Patient is  being admitted for further treatment. Patient stated that she has had a headache and dizziness in the past she was worked up by neurology last year with no significant finding  Pt developed substernal, non-radiating chest tightness yesterday that was relieved with belching. Pt endorses associated SOB at rest and n/v. Denies diaphoresis and palpitations. Pt also reports feeling dizzy and lightheaded yesterday. No syncopal episodes. She is currently not experiencing any CP.    Past Medical History  Diagnosis Date  . Diabetes mellitus   . Hypertension   . MI (myocardial infarction)   . Coronary artery disease   . Legally blind     Past Surgical History  Procedure Date  . Pacemaker insertion   . Coronary artery bypass graft   . Cardiac catheterization     Family History  Problem Relation Age of Onset  . Breast cancer Daughter   . Diabetes Son   . Diabetes Daughter   . Heart disease Father   . Diabetes Father   . Colon polyps Daughter    Social History:  reports that she has never smoked. She has never used smokeless tobacco. She reports that she does not drink alcohol or use illicit drugs.  Allergies:  Allergies  Allergen Reactions  . Darvon   . Other     Tylenol #3  . Penicillins Other (See Comments)    Was told had allergy from childhood... Unknown reaction  . Percocet (Oxycodone-Acetaminophen)   .  Percodan (Oxycodone-Aspirin)   . Vicodin (Hydrocodone-Acetaminophen)     Medications Prior to Admission  Medication Sig Dispense Refill  . aspirin 81 MG chewable tablet Chew 81 mg by mouth daily.      . cilostazol (PLETAL) 100 MG tablet Take 100 mg by mouth 2 (two) times daily.      . clopidogrel (PLAVIX) 75 MG tablet Take 75 mg by mouth daily.      Marland Kitchen gabapentin (NEURONTIN) 100 MG capsule Take 100 mg by mouth 2 (two) times daily.       Marland Kitchen levofloxacin (LEVAQUIN) 250 MG tablet Take 1 tablet (250 mg total) by mouth daily.  5 tablet  0  . levothyroxine (SYNTHROID, LEVOTHROID)  50 MCG tablet Take 50 mcg by mouth daily.      Marland Kitchen loratadine (CLARITIN) 10 MG tablet Take 10 mg by mouth daily as needed. For allergies      . magnesium citrate SOLN Take 1 Bottle by mouth once a week.      . metoprolol (LOPRESSOR) 50 MG tablet Take 25 mg by mouth 2 (two) times daily. Take 0.5 tablet (25 mg) twice daily      . nystatin (MYCOSTATIN) 100000 UNIT/ML suspension Take 5 mLs (500,000 Units total) by mouth 4 (four) times daily.  60 mL  0  . omeprazole (PRILOSEC) 20 MG capsule Take 20 mg by mouth daily.      . rosuvastatin (CRESTOR) 20 MG tablet Take 10 mg by mouth daily.      . Vitamin D, Ergocalciferol, (DRISDOL) 50000 UNITS CAPS Take 50,000 Units by mouth every 7 (seven) days. Mondays        Results for orders placed during the hospital encounter of 12/17/11 (from the past 48 hour(s))  CBC WITH DIFFERENTIAL     Status: Abnormal   Collection Time   12/17/11  4:03 PM      Component Value Range Comment   WBC 8.2  4.0 - 10.5 K/uL    RBC 3.72 (*) 3.87 - 5.11 MIL/uL    Hemoglobin 9.3 (*) 12.0 - 15.0 g/dL    HCT 16.1 (*) 09.6 - 46.0 %    MCV 77.4 (*) 78.0 - 100.0 fL    MCH 25.0 (*) 26.0 - 34.0 pg    MCHC 32.3  30.0 - 36.0 g/dL    RDW 04.5 (*) 40.9 - 15.5 %    Platelets 219  150 - 400 K/uL    Neutrophils Relative 79 (*) 43 - 77 %    Lymphocytes Relative 11 (*) 12 - 46 %    Monocytes Relative 10  3 - 12 %    Eosinophils Relative 0  0 - 5 %    Basophils Relative 0  0 - 1 %    Neutro Abs 6.5  1.7 - 7.7 K/uL    Lymphs Abs 0.9  0.7 - 4.0 K/uL    Monocytes Absolute 0.8  0.1 - 1.0 K/uL    Eosinophils Absolute 0.0  0.0 - 0.7 K/uL    Basophils Absolute 0.0  0.0 - 0.1 K/uL    Smear Review MORPHOLOGY UNREMARKABLE     COMPREHENSIVE METABOLIC PANEL     Status: Abnormal   Collection Time   12/17/11  4:03 PM      Component Value Range Comment   Sodium 133 (*) 135 - 145 mEq/L    Potassium 4.1  3.5 - 5.1 mEq/L    Chloride 102  96 - 112 mEq/L    CO2 21  19 - 32 mEq/L    Glucose, Bld 165 (*)  70 - 99 mg/dL    BUN 18  6 - 23 mg/dL    Creatinine, Ser 7.82 (*) 0.50 - 1.10 mg/dL    Calcium 8.6  8.4 - 95.6 mg/dL    Total Protein 6.2  6.0 - 8.3 g/dL    Albumin 2.1 (*) 3.5 - 5.2 g/dL    AST 14  0 - 37 U/L    ALT <5  0 - 35 U/L    Alkaline Phosphatase 121 (*) 39 - 117 U/L    Total Bilirubin 0.6  0.3 - 1.2 mg/dL    GFR calc non Af Amer 20 (*) >90 mL/min    GFR calc Af Amer 23 (*) >90 mL/min   SAMPLE TO BLOOD BANK     Status: Normal   Collection Time   12/17/11  4:03 PM      Component Value Range Comment   Blood Bank Specimen SAMPLE AVAILABLE FOR TESTING      Sample Expiration 12/20/2011     PRO B NATRIURETIC PEPTIDE     Status: Abnormal   Collection Time   12/17/11  4:03 PM      Component Value Range Comment   Pro B Natriuretic peptide (BNP) 2026.0 (*) 0 - 450 pg/mL   POCT I-STAT TROPONIN I     Status: Normal   Collection Time   12/17/11  4:16 PM      Component Value Range Comment   Troponin i, poc 0.01  0.00 - 0.08 ng/mL    Comment 3            LACTIC ACID, PLASMA     Status: Normal   Collection Time   12/17/11  4:35 PM      Component Value Range Comment   Lactic Acid, Venous 1.5  0.5 - 2.2 mmol/L   GLUCOSE, CAPILLARY     Status: Abnormal   Collection Time   12/17/11  9:01 PM      Component Value Range Comment   Glucose-Capillary 152 (*) 70 - 99 mg/dL   URINALYSIS, ROUTINE W REFLEX MICROSCOPIC     Status: Abnormal   Collection Time   12/17/11 11:35 PM      Component Value Range Comment   Color, Urine YELLOW  YELLOW    APPearance CLOUDY (*) CLEAR    Specific Gravity, Urine 1.015  1.005 - 1.030    pH 6.0  5.0 - 8.0    Glucose, UA NEGATIVE  NEGATIVE mg/dL    Hgb urine dipstick MODERATE (*) NEGATIVE    Bilirubin Urine NEGATIVE  NEGATIVE    Ketones, ur NEGATIVE  NEGATIVE mg/dL    Protein, ur 213 (*) NEGATIVE mg/dL    Urobilinogen, UA 1.0  0.0 - 1.0 mg/dL    Nitrite POSITIVE (*) NEGATIVE    Leukocytes, UA LARGE (*) NEGATIVE   URINE MICROSCOPIC-ADD ON     Status:  Abnormal   Collection Time   12/17/11 11:35 PM      Component Value Range Comment   Squamous Epithelial / LPF RARE  RARE    WBC, UA TOO NUMEROUS TO COUNT  <3 WBC/hpf    RBC / HPF 3-6  <3 RBC/hpf    Bacteria, UA MANY (*) RARE   BASIC METABOLIC PANEL     Status: Abnormal   Collection Time   12/18/11  5:05 AM      Component Value Range Comment   Sodium 133 (*)  135 - 145 mEq/L    Potassium 4.0  3.5 - 5.1 mEq/L    Chloride 103  96 - 112 mEq/L    CO2 21  19 - 32 mEq/L    Glucose, Bld 146 (*) 70 - 99 mg/dL    BUN 20  6 - 23 mg/dL    Creatinine, Ser 1.61 (*) 0.50 - 1.10 mg/dL    Calcium 8.1 (*) 8.4 - 10.5 mg/dL    GFR calc non Af Amer 19 (*) >90 mL/min    GFR calc Af Amer 23 (*) >90 mL/min   GLUCOSE, CAPILLARY     Status: Normal   Collection Time   12/18/11  7:49 AM      Component Value Range Comment   Glucose-Capillary 82  70 - 99 mg/dL   GLUCOSE, CAPILLARY     Status: Abnormal   Collection Time   12/18/11 11:48 AM      Component Value Range Comment   Glucose-Capillary 133 (*) 70 - 99 mg/dL   GLUCOSE, CAPILLARY     Status: Abnormal   Collection Time   12/18/11  4:39 PM      Component Value Range Comment   Glucose-Capillary 102 (*) 70 - 99 mg/dL    Comment 1 Notify RN      Ct Abdomen Pelvis Wo Contrast  12/17/2011  *RADIOLOGY REPORT*  Clinical Data: Severe nausea and vomiting.  Abdominal pain.  CT ABDOMEN AND PELVIS WITHOUT CONTRAST  Technique:  Multidetector CT imaging of the abdomen and pelvis was performed following the standard protocol without intravenous contrast.  Comparison: 06/16/2011  Findings: Probable tiny calcified gallstones again noted, however there is no evidence of cholecystitis or biliary ductal dilatation. Noncontrast images of the liver, pancreas, spleen, adrenal glands, and kidneys are unremarkable in appearance.  Chronic bilateral perinephric stranding again demonstrated as well as probable tiny fluid attenuation left renal cyst.  No evidence of renal calculi or  hydronephrosis.  No soft tissue masses or lymphadenopathy identified within the abdomen or pelvis.  Uterus and adnexal regions are unremarkable. No evidence of inflammatory process or abnormal fluid collections. No evidence of dilated bowel loops or hernia.  Cardiomegaly and diffuse coronary artery calcifications again demonstrated, and pacemaker leads again noted in the right heart.  IMPRESSION:  1.  Stable exam.  No acute findings. 2.  Cholelithiasis, without evidence of acute cholecystitis.   Original Report Authenticated By: Myles Rosenthal, M.D.    Dg Chest Port 1 View  12/17/2011  *RADIOLOGY REPORT*  Clinical Data: Shortness of breath, pain, weakness, and vomiting  PORTABLE CHEST - 1 VIEW  Comparison: Chest radiograph 12/15/2011  Findings: Stable cardiomegaly in this patient status post median sternotomy for CABG.  Left chest wall dual lead pacer appears stable.  Lung volumes are low.  Stable elevation of the right hemidiaphragm.  The lungs appear clear.  No visible pleural effusion or pneumothorax. Visualized upper abdomen is unremarkable.  IMPRESSION: Stable chest radiograph.  No acute cardiopulmonary disease identified.   Original Report Authenticated By: Britta Mccreedy, M.D.    Dg Foot Complete Right  12/17/2011  *RADIOLOGY REPORT*  Clinical Data: Right foot pain.  No known injury.  RIGHT FOOT COMPLETE - 3+ VIEW  Comparison: None.  Findings: Sock containing radiopaque material limits fine bone detail.  There is no evidence of acute fracture or dislocation.  No other significant bone abnormality identified.  Peripheral vascular calcification noted.  IMPRESSION: No acute findings.   Original Report Authenticated By: Myles Rosenthal, M.D.  ROS: General:no colds or fevers, no weight changes Skin:no rashes or ulcers HEENT:no blurred vision, no congestion CV:see HPI PUL:see HPI GI:no diarrhea constipation or melena, no indigestion GU:no hematuria, no dysuria MS:no joint pain, no claudication Neuro:no  syncope, + lightheadedness Endo:+ diabetes, no thyroid disease   Blood pressure 111/52, pulse 89, temperature 98.4 F (36.9 C), temperature source Oral, resp. rate 18, SpO2 97.00%.  ZO:[id number], cooperative, no distress Skin:warm and dry Neck: No JVD, no carotic bruits. Heart: distant heart sounds, regular rhythm,  Lungs: clear to ascultation bilaterally Abd: + BS, soft, nontender Ext: 2+ radial pulses and symmetric, 0 DP bilaterally, No LEE Neuro:grossly intact   Assessment/Plan Active Problems:  Diabetes mellitus  Acute on chronic renal failure  Pacemaker, medtronic Adapta 06/29/06  secondary to symptomatic bradycardia and 2nd degree heart block  Infection, staphylococcal  Bacterial UTI  Sepsis due to other specified Staphylococcus    Plan: Per Dr. Tresa Endo.  INGOLD,LAURA R 12/18/2011, 5:44 PM   Patient seen and examined. Agree with assessment and plan. 78 yo former pt of Dr. Clarene Duke who has known CAD, s/p CABG in 1994. Last cath in 05/2006 showed severe native CAD with patent SVG to PDA, SVG to Dx2, LIMA to LAD, and SVG to OM had 70 - 75% stenosis.  She is s/p pacemaker for CHB. She has chronic renal insufficiency and anemia, recently received 2 units PRBC tx late last week. She was readmitted post DC with hypotension, nausea with belching and vague chest sensation.  No further chest pain. ECG sinus rhythm with A sensing and V pacing. Her last pacemaker check was in June 2013. Will cycle enzymes, check 2d echo to re-asses LV function with elevated BNP on presentation. Once BP stabilized, medical therapy for CAD and add low dose nitrates. Would not re-cath with Cr 2.13. Pt is essentially wheelchair bound, and not active.   Lennette Bihari, MD, [facility] 12/18/2011 5:51 PM

## 2011-12-18 NOTE — Consult Note (Signed)
Referring Provider: No ref. provider found Primary Care Physician:  Willey Blade, MD Primary Gastroenterologist:  Dr. Arlyce Dice  Reason for Consultation:  Anemia  HPI: [name] is a 78 y.o. female who was admitted on 11/28 from Dr. Diamantina [facility] office with anemia for blood transfusion. While she was inpatient she developed a fever and was noted to have a bladder infection. Her blood pressure was also running low. She felt good on 12/1 and was discharged home with by mouth Levaquin to take and follow up with Dr. August Saucer this week.  She stated that when she got home she got lightheaded and dizzy with some chest tightness and nausea, vomiting once. She thinks that this happened secondary to taking all her medications on empty stomach. She normally takes her medication after she eats. EMS was called and she was brought back to the emergency room. In the emergency room her blood pressure remained in the 90s systolic to low 100s without fluids heart rate 108 paced rhythm and pulse ox 96% on room air.  She had a chest x-ray done that was normal; cardiac markers were normal as well.  Labs showed a Hgb of 9.3 grams, which was a gram higher than it had been earlier that day prior to her discharge.  She was admitted for further evaluation and treatment and we are being asked to see her for her anemia.  Vitamin B12 is normal, folate is pending, and iron studies appear to be consistent with anemia of chronic disease (ferritin 863, percent sat 19, TIBC 187, and iron 36), although her MCV is slightly low at 77.4.  She does have chronic renal insufficiency.  Orders are in to check stool for occult blood; she has not moved her bowels so has been given a bottle of magnesium citrate.  She is on asa 81 mg daily, plavix daily, and pletal (half life 11-14 hours) twice daily.  She had a colonoscopy in about 1999, but I have not yet found the records of that procedure.  She is not a great historian in describing her symptoms.   She is legally blind so she cannot really see her stool well, but she thought it was black at one point, but her children told her it was not.  She denies any persistent abdominal pains; gets them here and there.  Had about 30 pound weight loss in the past 5-6 months.  No appetite.  Has nausea and vomiting when she tries to eat certain things; cannot really say what foods bother her, but does not occur every time that she eats.  No dysphagia.  She says that she was told that she could not be put to sleep and undergo surgery because she is too sick and would not survive.   Past Medical History  Diagnosis Date  . Diabetes mellitus   . Hypertension   . MI (myocardial infarction)   . Coronary artery disease   . Legally blind     Past Surgical History  Procedure Date  . Pacemaker insertion   . Coronary artery bypass graft     Prior to Admission medications   Medication Sig Start Date End Date Taking? Authorizing Provider  aspirin 81 MG chewable tablet Chew 81 mg by mouth daily.   Yes Historical Provider, MD  cilostazol (PLETAL) 100 MG tablet Take 100 mg by mouth 2 (two) times daily.   Yes Historical Provider, MD  clopidogrel (PLAVIX) 75 MG tablet Take 75 mg by mouth daily.   Yes Historical  Provider, MD  gabapentin (NEURONTIN) 100 MG capsule Take 100 mg by mouth 2 (two) times daily.    Yes Historical Provider, MD  levofloxacin (LEVAQUIN) 250 MG tablet Take 1 tablet (250 mg total) by mouth daily. 12/17/11  Yes Novlet Adelina Mings, MD  levothyroxine (SYNTHROID, LEVOTHROID) 50 MCG tablet Take 50 mcg by mouth daily.   Yes Historical Provider, MD  loratadine (CLARITIN) 10 MG tablet Take 10 mg by mouth daily as needed. For allergies   Yes Historical Provider, MD  magnesium citrate SOLN Take 1 Bottle by mouth once a week.   Yes Historical Provider, MD  metoprolol (LOPRESSOR) 50 MG tablet Take 25 mg by mouth 2 (two) times daily. Take 0.5 tablet (25 mg) twice daily   Yes Historical Provider, MD  nystatin  (MYCOSTATIN) 100000 UNIT/ML suspension Take 5 mLs (500,000 Units total) by mouth 4 (four) times daily. 12/17/11 12/21/11 Yes Novlet Adelina Mings, MD  omeprazole (PRILOSEC) 20 MG capsule Take 20 mg by mouth daily.   Yes Historical Provider, MD  rosuvastatin (CRESTOR) 20 MG tablet Take 10 mg by mouth daily.   Yes Historical Provider, MD  Vitamin D, Ergocalciferol, (DRISDOL) 50000 UNITS CAPS Take 50,000 Units by mouth every 7 (seven) days. Mondays   Yes Historical Provider, MD    Current Facility-Administered Medications  Medication Dose Route Frequency Provider Last Rate Last Dose  . 0.9 %  sodium chloride infusion   Intravenous Continuous Altha Harm, MD 125 mL/hr at 12/18/11 1529    . acetaminophen (TYLENOL) tablet 650 mg  650 mg Oral Q6H PRN Jinger Neighbors, NP   650 mg at 12/17/11 2212   Or  . acetaminophen (TYLENOL) suppository 650 mg  650 mg Rectal Q6H PRN Jinger Neighbors, NP      . aspirin chewable tablet 81 mg  81 mg Oral Daily Jinger Neighbors, NP   81 mg at 12/18/11 1005  . atorvastatin (LIPITOR) tablet 20 mg  20 mg Oral q1800 Jinger Neighbors, NP      . cilostazol (PLETAL) tablet 100 mg  100 mg Oral BID Jinger Neighbors, NP   100 mg at 12/18/11 1005  . clopidogrel (PLAVIX) tablet 75 mg  75 mg Oral Daily Jinger Neighbors, NP   75 mg at 12/18/11 1005  . gabapentin (NEURONTIN) capsule 100 mg  100 mg Oral BID Jinger Neighbors, NP   100 mg at 12/18/11 1005  . insulin aspart (novoLOG) injection 0-20 Units  0-20 Units Subcutaneous TID WC Jinger Neighbors, NP   3 Units at 12/18/11 1211  . insulin aspart (novoLOG) injection 0-5 Units  0-5 Units Subcutaneous QHS Jinger Neighbors, NP      . levothyroxine (SYNTHROID, LEVOTHROID) tablet 50 mcg  50 mcg Oral QAC breakfast Jinger Neighbors, NP   50 mcg at 12/18/11 0913  . loratadine (CLARITIN) tablet 10 mg  10 mg Oral Daily PRN Jinger Neighbors, NP      . magnesium citrate solution 1 Bottle  1 Bottle Oral Once Altha Harm, MD      . nystatin (MYCOSTATIN) 100000 UNIT/ML suspension  500,000 Units  500,000 Units Oral QID Jinger Neighbors, NP   500,000 Units at 12/18/11 1005  . ondansetron (ZOFRAN) tablet 4 mg  4 mg Oral Q6H PRN Jinger Neighbors, NP       Or  . ondansetron (ZOFRAN) injection 4 mg  4 mg Intravenous Q6H PRN Jinger Neighbors, NP      .  pantoprazole (PROTONIX) EC tablet 40 mg  40 mg Oral Daily Jinger Neighbors, NP   40 mg at 12/18/11 1005  . piperacillin-tazobactam (ZOSYN) IVPB 3.375 g  3.375 g Intravenous Q8H Toy Baker, MD 12.5 mL/hr at 12/18/11 1005 3.375 g at 12/18/11 1005  . [COMPLETED] sodium chloride 0.9 % bolus 500 mL  500 mL Intravenous Once Toy Baker, MD 500 mL/hr at 12/17/11 1735 500 mL at 12/17/11 1735  . Vitamin D (Ergocalciferol) (DRISDOL) capsule 50,000 Units  50,000 Units Oral Q7 days Jinger Neighbors, NP   50,000 Units at 12/18/11 1005    Allergies as of 12/17/2011 - Review Complete 12/17/2011  Allergen Reaction Noted  . Darvon  03/14/2011  . Other  09/12/2011  . Penicillins Other (See Comments) 12/17/2011  . Percocet (oxycodone-acetaminophen)  03/14/2011  . Percodan (oxycodone-aspirin)  09/12/2011    Family History  Problem Relation Age of Onset  . Breast cancer Daughter   . Diabetes Son   . Diabetes Daughter   . Heart disease Father   . Diabetes Father   . Colon polyps Daughter     History   Social History  . Marital Status: Married    Spouse Name: N/A    Number of Children: 10  . Years of Education: N/A   Occupational History  .     Social History Main Topics  . Smoking status: Never Smoker   . Smokeless tobacco: Never Used  . Alcohol Use: No  . Drug Use: No  . Sexually Active: Not on file   Other Topics Concern  . Not on file   Social History Narrative  . No narrative on file    Review of Systems: Ten point ROS is O/W negative except as mentioned in HPI.  Physical Exam: Vital signs in last 24 hours: Temp:  [98.4 F (36.9 C)-99 F (37.2 C)] 98.4 F (36.9 C) (12/02 1347) Pulse Rate:  [89-92] 89  (12/02  1347) Resp:  [17-20] 18  (12/02 1347) BP: (87-111)/(52-64) 111/52 mmHg (12/02 1347) SpO2:  [97 %-100 %] 97 % (12/02 1347) Last BM Date: 12/12/11 General:   Alert, frail, pleasant and cooperative in NAD. Head:  Normocephalic and atraumatic. Eyes:  Sclera clear, no icterus.  Conjunctiva pink. Ears:  Normal auditory acuity. Mouth:  No deformity or lesions.   Lungs:  Clear throughout to auscultation.  No wheezes, crackles, or rhonchi.  Heart:  Regular rate and rhythm; no murmurs, clicks, rubs, or gallops. Abdomen:  Soft, non-distended, BS active, nonpalp mass or hsm.  Mild epigastric TTP that elicited some belching. Rectal:  Deferred.  Msk:  Symmetrical without gross deformities. Extremities:  Without clubbing or edema. Neurologic:  Alert and  oriented x4;  grossly normal neurologically. Skin:  Intact without significant lesions or rashes. Psych:  Alert and cooperative. Normal mood and affect.  Intake/Output from previous day: 12/01 0701 - 12/02 0700 In: 1310 [P.O.:60; I.V.:1250] Out: 400 [Urine:400] Intake/Output this shift: Total I/O In: 1088 [P.O.:120; I.V.:968] Out: -   Lab Results:  Basename 12/17/11 1603 12/17/11 0420 12/16/11 0430  WBC 8.2 5.8 6.1  HGB 9.3* 8.2* 8.6*  HCT 28.8* 25.3* 26.9*  PLT 219 202 195   BMET  Basename 12/18/11 0505 12/17/11 1603 12/17/11 0420  NA 133* 133* 130*  K 4.0 4.1 4.0  CL 103 102 101  CO2 21 21 21   GLUCOSE 146* 165* 119*  BUN 20 18 18   CREATININE 2.13* 2.10* 2.03*  CALCIUM 8.1* 8.6 7.9*  LFT  Basename 12/17/11 1603  PROT 6.2  ALBUMIN 2.1*  AST 14  ALT <5  ALKPHOS 121*  BILITOT 0.6  BILIDIR --  IBILI --    Studies/Results: Ct Abdomen Pelvis Wo Contrast  12/17/2011  *RADIOLOGY REPORT*  Clinical Data: Severe nausea and vomiting.  Abdominal pain.  CT ABDOMEN AND PELVIS WITHOUT CONTRAST  Technique:  Multidetector CT imaging of the abdomen and pelvis was performed following the standard protocol without intravenous contrast.   Comparison: 06/16/2011  Findings: Probable tiny calcified gallstones again noted, however there is no evidence of cholecystitis or biliary ductal dilatation. Noncontrast images of the liver, pancreas, spleen, adrenal glands, and kidneys are unremarkable in appearance.  Chronic bilateral perinephric stranding again demonstrated as well as probable tiny fluid attenuation left renal cyst.  No evidence of renal calculi or hydronephrosis.  No soft tissue masses or lymphadenopathy identified within the abdomen or pelvis.  Uterus and adnexal regions are unremarkable. No evidence of inflammatory process or abnormal fluid collections. No evidence of dilated bowel loops or hernia.  Cardiomegaly and diffuse coronary artery calcifications again demonstrated, and pacemaker leads again noted in the right heart.  IMPRESSION:  1.  Stable exam.  No acute findings. 2.  Cholelithiasis, without evidence of acute cholecystitis.   Original Report Authenticated By: Myles Rosenthal, M.D.    Dg Chest Port 1 View  12/17/2011  *RADIOLOGY REPORT*  Clinical Data: Shortness of breath, pain, weakness, and vomiting  PORTABLE CHEST - 1 VIEW  Comparison: Chest radiograph 12/15/2011  Findings: Stable cardiomegaly in this patient status post median sternotomy for CABG.  Left chest wall dual lead pacer appears stable.  Lung volumes are low.  Stable elevation of the right hemidiaphragm.  The lungs appear clear.  No visible pleural effusion or pneumothorax. Visualized upper abdomen is unremarkable.  IMPRESSION: Stable chest radiograph.  No acute cardiopulmonary disease identified.   Original Report Authenticated By: Britta Mccreedy, M.D.    Dg Foot Complete Right  12/17/2011  *RADIOLOGY REPORT*  Clinical Data: Right foot pain.  No known injury.  RIGHT FOOT COMPLETE - 3+ VIEW  Comparison: None.  Findings: Sock containing radiopaque material limits fine bone detail.  There is no evidence of acute fracture or dislocation.  No other significant bone  abnormality identified.  Peripheral vascular calcification noted.  IMPRESSION: No acute findings.   Original Report Authenticated By: Myles Rosenthal, M.D.     IMPRESSION:  -Anemia:  Likely anemia of chronic disease -Nausea, vomiting, and weight loss -Acute on chronic renal disease -CHF and other cardiac comorbidities  PLAN: -Agree with checking stool for occult blood. -Monitor Hgb and transfuse prn. -Will discuss with Dr. Leone Payor regarding endoscopic evaluation, however, with her significant cardiac co-morbidities, she may not be a good endoscopic candidate.  May need to consider EGD for evaluation, however, in the setting of nausea, vomiting, and weight loss.   Lenea Bywater D.  12/18/2011, 4:36 PM  Pager number [phone]

## 2011-12-18 NOTE — Progress Notes (Signed)
[facility] PROGRESS NOTE  [name] ZOX:[id number] DOB: April 27, 1920 DOA: 12/17/2011 PCP: August Saucer [name], MD  Assessment/Plan: Active Problems:  Sepsis due to other specified Staphylococcus: Pt presented with known UTI and physiology consistent with sepsis. The patient has an extensive cardiac history which may be contributing to her hypotension. Pt is on Zosyn which should cover coagulase negative staph. I have ordered a 2-D ECHO and have asked her Cardiology specialist to see her in consultation. In the absence of knowledge of LVF and previous BNP's it is difficult to interpret the current BNP.    Infection, staphylococcal: Coagulase negative UTI. Continue Zosyn  Hypotension: Hold Metoprolol for now. Continue IVF  Microcytic Anemia: Etiology is unclear. Pt has had a recent transfusion and stool has been reported by family to be Guiac negative.  Will ask GI to see patient in evaluation. Check a reticulocyte count.   CKD IV: Renal function at baseline   Code Status: Full Code Family Communication: Daughter at bedside and updated Disposition Plan: Home at time of discharge  Maquita Sandoval A.  [facility] Pager [phone]. If 7PM-7AM, please contact night-coverage. 12/18/2011, 2:46 PM  LOS: 1 day   Brief narrative: Patient is 78 year old African American female that was admitted 2 days ago from Dr. Diamantina [facility] office with anemia for blood transfusion. While she was inpatient she developed a fever and was noted to have a bladder infection. Her blood pressure was also running low. She felt good on 12/1 and was discharged home with by mouth Levaquin to take and follow up with Dr. August Saucer this week. Patient given a dose of Levaquin prior to discharge. She stated that when she got home she got lightheaded and dizzy with some chest tightness and nausea vomiting once. She thinks that this happened secondary to taking all her medications on empty stomach. She normally takes her medication  after she eats. EMS was called. Per family when EMS got there her blood pressure was in the 90s systolic and her pulse ox was 82% on room air. She was brought back to the emergency room. In the emergency room her blood pressure remained in the 90s systolic to low 100s without fluids heart rate 108 paced rhythm and pulse ox 96% on room air. Patient stated that she felt fine now. She had a chest x-ray done that was normal cardiac markers were normal, lab work showed no significant change, EKG shows a paced rhythm. She stated that she started feeling bad right after she took the Levaquin. Patient is being admitted for further treatment. Patient stated that she has had a headache and dizziness in the past she was worked up by neurology last year with no significant finding.   Consultants:  Cardiology  Gastroenterology  Procedures:  None  Antibiotics:  Zosyn 12/1 >>  HPI/Subjective: Pt states that she feels better than on admission. Tolerated diet today.  Objective: Filed Vitals:   12/17/11 2050 12/18/11 0600 12/18/11 1137 12/18/11 1347  BP: 110/64 87/52 98/56  111/52  Pulse:  92 92 89  Temp: 99 F (37.2 C) 98.7 F (37.1 C)  98.4 F (36.9 C)  TempSrc: Oral Oral  Oral  Resp: 20 18  18   SpO2: 100% 98%  97%   Weight change:   Intake/Output Summary (Last 24 hours) at 12/18/11 1446 Last data filed at 12/18/11 0823  Gross per 24 hour  Intake   1430 ml  Output    400 ml  Net   1030 ml    General: Alert,  awake, oriented x3, in no acute distress.  HEENT: /AT PEERL, EOMI Neck: Trachea midline,  no masses, no thyromegal,y no JVD, no carotid bruit OROPHARYNX:  Moist, No exudate/ erythema/lesions.  Heart: Regular rate and rhythm, without murmurs, rubs, gallops. palpation.  Lungs: Clear to auscultation, no wheezing or rhonchi noted. No increased vocal fremitus resonant to percussion  Abdomen: Soft, nontender, nondistended, positive bowel sounds, no masses no hepatosplenomegaly noted.    Neuro: No focal neurological deficits noted. Musculoskeletal: No warm swelling or erythema around joints, no spinal tenderness noted.     Data Reviewed: Basic Metabolic Panel:  Lab 12/18/11 8413 12/17/11 1603 12/17/11 0420 12/16/11 0430 12/15/11 0650  NA 133* 133* 130* 133* 136  K 4.0 4.1 4.0 4.2 4.7  CL 103 102 101 102 102  CO2 21 21 21 21 23   GLUCOSE 146* 165* 119* 127* 103*  BUN 20 18 18 22  26*  CREATININE 2.13* 2.10* 2.03* 2.54* 2.69*  CALCIUM 8.1* 8.6 7.9* 8.1* 9.0  MG -- -- -- -- --  PHOS -- -- -- 2.9 --   Liver Function Tests:  Lab 12/17/11 1603 12/16/11 0430 12/14/11 0943  AST 14 -- 17  ALT <5 -- <5  ALKPHOS 121* -- 139*  BILITOT 0.6 -- 0.4  PROT 6.2 -- 7.2  ALBUMIN 2.1* 1.8* 2.4*   No results found for this basename: LIPASE:5,AMYLASE:5 in the last 168 hours No results found for this basename: AMMONIA:5 in the last 168 hours CBC:  Lab 12/17/11 1603 12/17/11 0420 12/16/11 0430 12/15/11 0650 12/14/11 0943  WBC 8.2 5.8 6.1 6.2 5.3  NEUTROABS 6.5 -- -- -- --  HGB 9.3* 8.2* 8.6* 9.3* 7.6*  HCT 28.8* 25.3* 26.9* 28.7* 24.3*  MCV 77.4* 76.9* 77.5* 76.9* 75.9*  PLT 219 202 195 219 278   Cardiac Enzymes: No results found for this basename: CKTOTAL:5,CKMB:5,CKMBINDEX:5,TROPONINI:5 in the last 168 hours BNP (last 3 results)  Basename 12/17/11 1603 12/15/11 0650 11/06/11 1400  PROBNP 2026.0* 1637.0* 2645.0*   CBG:  Lab 12/18/11 1148 12/18/11 0749 12/17/11 2101 12/17/11 0738 12/16/11 2052  GLUCAP 133* 82 152* 114* 169*    Recent Results (from the past 240 hour(s))  CULTURE, BLOOD (ROUTINE X 2)     Status: Normal (Preliminary result)   Collection Time   12/16/11 12:05 AM      Component Value Range Status Comment   Specimen Description BLOOD RIGHT HAND   Final    Special Requests BOTTLES DRAWN AEROBIC AND ANAEROBIC 10CC   Final    Culture  Setup Time 12/16/2011 03:04   Final    Culture     Final    Value:        BLOOD CULTURE RECEIVED NO GROWTH TO DATE  CULTURE WILL BE HELD FOR 5 DAYS BEFORE ISSUING A FINAL NEGATIVE REPORT   Report Status PENDING   Incomplete   CULTURE, BLOOD (ROUTINE X 2)     Status: Normal (Preliminary result)   Collection Time   12/16/11 12:12 AM      Component Value Range Status Comment   Specimen Description BLOOD RIGHT ARM   Final    Special Requests BOTTLES DRAWN AEROBIC AND ANAEROBIC 10C   Final    Culture  Setup Time 12/16/2011 03:04   Final    Culture     Final    Value:        BLOOD CULTURE RECEIVED NO GROWTH TO DATE CULTURE WILL BE HELD FOR 5 DAYS BEFORE ISSUING A FINAL NEGATIVE  REPORT   Report Status PENDING   Incomplete   URINE CULTURE     Status: Normal   Collection Time   12/16/11 11:06 AM      Component Value Range Status Comment   Specimen Description URINE, CLEAN CATCH   Final    Special Requests NONE   Final    Culture  Setup Time 12/16/2011 13:04   Final    Colony Count >=100,000 COLONIES/ML   Final    Culture     Final    Value: STAPHYLOCOCCUS SPECIES (COAGULASE NEGATIVE)     Note: RIFAMPIN AND GENTAMICIN SHOULD NOT BE USED AS SINGLE DRUGS FOR TREATMENT OF STAPH INFECTIONS.   Report Status 12/18/2011 FINAL   Final    Organism ID, Bacteria STAPHYLOCOCCUS SPECIES (COAGULASE NEGATIVE)   Final      Studies: Ct Abdomen Pelvis Wo Contrast  12/17/2011  *RADIOLOGY REPORT*  Clinical Data: Severe nausea and vomiting.  Abdominal pain.  CT ABDOMEN AND PELVIS WITHOUT CONTRAST  Technique:  Multidetector CT imaging of the abdomen and pelvis was performed following the standard protocol without intravenous contrast.  Comparison: 06/16/2011  Findings: Probable tiny calcified gallstones again noted, however there is no evidence of cholecystitis or biliary ductal dilatation. Noncontrast images of the liver, pancreas, spleen, adrenal glands, and kidneys are unremarkable in appearance.  Chronic bilateral perinephric stranding again demonstrated as well as probable tiny fluid attenuation left renal cyst.  No evidence of  renal calculi or hydronephrosis.  No soft tissue masses or lymphadenopathy identified within the abdomen or pelvis.  Uterus and adnexal regions are unremarkable. No evidence of inflammatory process or abnormal fluid collections. No evidence of dilated bowel loops or hernia.  Cardiomegaly and diffuse coronary artery calcifications again demonstrated, and pacemaker leads again noted in the right heart.  IMPRESSION:  1.  Stable exam.  No acute findings. 2.  Cholelithiasis, without evidence of acute cholecystitis.   Original Report Authenticated By: Myles Rosenthal, M.D.    Dg Chest Port 1 View  12/17/2011  *RADIOLOGY REPORT*  Clinical Data: Shortness of breath, pain, weakness, and vomiting  PORTABLE CHEST - 1 VIEW  Comparison: Chest radiograph 12/15/2011  Findings: Stable cardiomegaly in this patient status post median sternotomy for CABG.  Left chest wall dual lead pacer appears stable.  Lung volumes are low.  Stable elevation of the right hemidiaphragm.  The lungs appear clear.  No visible pleural effusion or pneumothorax. Visualized upper abdomen is unremarkable.  IMPRESSION: Stable chest radiograph.  No acute cardiopulmonary disease identified.   Original Report Authenticated By: Britta Mccreedy, M.D.    Dg Chest Port 1 View  12/15/2011  *RADIOLOGY REPORT*  Clinical Data: Fever.  PORTABLE CHEST - 1 VIEW  Comparison: 09/12/2011  Findings: Stable appearance of dual chamber pacemaker.  Lungs show no evidence of pulmonary edema or focal consolidation.  There is stable elevation of the right hemidiaphragm.  Heart size is stable status post prior CABG.  No significant pleural effusions are identified.  IMPRESSION: No active disease.   Original Report Authenticated By: Irish Lack, M.D.    Dg Foot Complete Right  12/17/2011  *RADIOLOGY REPORT*  Clinical Data: Right foot pain.  No known injury.  RIGHT FOOT COMPLETE - 3+ VIEW  Comparison: None.  Findings: Sock containing radiopaque material limits fine bone detail.   There is no evidence of acute fracture or dislocation.  No other significant bone abnormality identified.  Peripheral vascular calcification noted.  IMPRESSION: No acute findings.   Original Report  Authenticated By: Myles Rosenthal, M.D.     Scheduled Meds:   . aspirin  81 mg Oral Daily  . atorvastatin  20 mg Oral q1800  . cilostazol  100 mg Oral BID  . clopidogrel  75 mg Oral Daily  . gabapentin  100 mg Oral BID  . insulin aspart  0-20 Units Subcutaneous TID WC  . insulin aspart  0-5 Units Subcutaneous QHS  . levothyroxine  50 mcg Oral QAC breakfast  . magnesium citrate  1 Bottle Oral Once  . nystatin  500,000 Units Oral QID  . pantoprazole  40 mg Oral Daily  . piperacillin-tazobactam (ZOSYN)  IV  3.375 g Intravenous Q8H  . [COMPLETED] sodium chloride  500 mL Intravenous Once  . Vitamin D (Ergocalciferol)  50,000 Units Oral Q7 days   Continuous Infusions:   . sodium chloride 125 mL/hr at 12/17/11 1734    Active Problems:  Diabetes mellitus  Acute on chronic renal failure  Infection, staphylococcal  Bacterial UTI  Sepsis due to other specified Staphylococcus

## 2011-12-19 DIAGNOSIS — R630 Anorexia: Secondary | ICD-10-CM | POA: Insufficient documentation

## 2011-12-19 DIAGNOSIS — R634 Abnormal weight loss: Secondary | ICD-10-CM

## 2011-12-19 DIAGNOSIS — D638 Anemia in other chronic diseases classified elsewhere: Secondary | ICD-10-CM | POA: Diagnosis present

## 2011-12-19 DIAGNOSIS — R112 Nausea with vomiting, unspecified: Secondary | ICD-10-CM | POA: Insufficient documentation

## 2011-12-19 LAB — GLUCOSE, CAPILLARY: Glucose-Capillary: 92 mg/dL (ref 70–99)

## 2011-12-19 LAB — BASIC METABOLIC PANEL
CO2: 21 mEq/L (ref 19–32)
Calcium: 8.1 mg/dL — ABNORMAL LOW (ref 8.4–10.5)
Chloride: 107 mEq/L (ref 96–112)
Sodium: 137 mEq/L (ref 135–145)

## 2011-12-19 LAB — CBC WITH DIFFERENTIAL/PLATELET
Basophils Absolute: 0 10*3/uL (ref 0.0–0.1)
Eosinophils Relative: 1 % (ref 0–5)
Lymphocytes Relative: 20 % (ref 12–46)
Neutro Abs: 3.2 10*3/uL (ref 1.7–7.7)
Platelets: 169 10*3/uL (ref 150–400)
RDW: 18.4 % — ABNORMAL HIGH (ref 11.5–15.5)
WBC: 4.7 10*3/uL (ref 4.0–10.5)

## 2011-12-19 LAB — URINE CULTURE

## 2011-12-19 LAB — OCCULT BLOOD X 1 CARD TO LAB, STOOL: Fecal Occult Bld: NEGATIVE

## 2011-12-19 MED ORDER — ZOLPIDEM TARTRATE 5 MG PO TABS
2.5000 mg | ORAL_TABLET | Freq: Every evening | ORAL | Status: DC | PRN
  Administered 2011-12-19 – 2011-12-20 (×2): 2.5 mg via ORAL
  Administered 2011-12-21: 22:00:00 via ORAL
  Administered 2011-12-22 – 2011-12-25 (×4): 2.5 mg via ORAL
  Filled 2011-12-19 (×7): qty 1

## 2011-12-19 MED ORDER — LEVOFLOXACIN 250 MG PO TABS
250.0000 mg | ORAL_TABLET | Freq: Every day | ORAL | Status: DC
  Administered 2011-12-19 – 2011-12-25 (×7): 250 mg via ORAL
  Filled 2011-12-19 (×7): qty 1

## 2011-12-19 MED ORDER — LORATADINE 10 MG PO TABS
10.0000 mg | ORAL_TABLET | Freq: Every day | ORAL | Status: DC
  Administered 2011-12-20 – 2011-12-26 (×7): 10 mg via ORAL
  Filled 2011-12-19 (×7): qty 1

## 2011-12-19 MED ORDER — ALUM & MAG HYDROXIDE-SIMETH 200-200-20 MG/5ML PO SUSP
15.0000 mL | Freq: Four times a day (QID) | ORAL | Status: DC | PRN
  Administered 2011-12-19 – 2011-12-26 (×8): 15 mL via ORAL
  Filled 2011-12-19 (×7): qty 30

## 2011-12-19 NOTE — Progress Notes (Signed)
  Echocardiogram 2D Echocardiogram has been performed.  [name] 12/19/2011, 11:28 AM

## 2011-12-19 NOTE — Progress Notes (Signed)
[facility] PROGRESS NOTE  [name] ZOX:[id number] DOB: [birth year] DOA: 12/17/2011 PCP: August Saucer [name]  Assessment/Plan: Active Problems:  Sepsis due to other specified Staphylococcus: Sepsis physiology is reversed. Repeat U/C shows no growth. Pt has completed 3 days of Zosyn and I discontinued the Zosyn. I will transition over to Levaquin to complete treatment for a total of 7 days.   Infection, staphylococcal: Coagulase negative UTI. Continue Zosyn  Hypotension: Metoprolol and nitro paste continue to be on hold and the blood pressure has recovered and is normal range. I have examined her clinic records which show that her normal blood pressure ranges in the 1 teens to the 120 systolic.I will discontinue IV fluids so as to refrain from entering into the state of pulmonary edema. The patient has been seen by her cardiologist and her pacemaker interrogated. I appreciate their input and will continue with medical management  Microcytic Anemia: Etiology is unclear. Pt has had a recent transfusion and stool has been reported by family to be Guiac negative. Her hemoglobin continues to drift down despite a recent transfusion. She's been seen by gastroenterology but the family is reluctant to proceed with an EGD. I had a long discussion with the patient and her daughter [name] explaining that unless we would agree to the explore the etiology, and the only options would be palliative transfusion and comfort. Furthermore rehospitalization without intervention would not be beneficial to the patient. I've discussed with them and have placed a palliative care consult for goals of care.  CKD IV: Renal function at baseline  Anorexia: The patient has had anorexia with weight loss. The etiology of which is unclear. I discussed the possibility of EGD however the patient and family are reluctant to proceed at this time given the patient's overall medical condition. A palliative care consult for goals  of care has been placed. I've asked the family to liberalize her diet to whatever she will tolerate.  Code Status: Full Code Family Communication: Daughter  [name] at  bedside and updated Disposition Plan: Home at time of discharge  [place] Canty A.  [facility] Pager [phone]. If 7PM-7AM, please contact night-coverage. 12/19/2011, 6:08 PM  LOS: 2 days   Brief narrative: Patient is 78 year old African American female that was admitted 2 days ago from Dr. Diamantina Providence office with anemia for blood transfusion. While she was inpatient she developed a fever and was noted to have a bladder infection. Her blood pressure was also running low. She felt good on 12/1 and was discharged home with by mouth Levaquin to take and follow up with Dr. August Saucer this week. Patient given a dose of Levaquin prior to discharge. She stated that when she got home she got lightheaded and dizzy with some chest tightness and nausea vomiting once. She thinks that this happened secondary to taking all her medications on empty stomach. She normally takes her medication after she eats. EMS was called. Per family when EMS got there her blood pressure was in the 90s systolic and her pulse ox was 82% on room air. She was brought back to the emergency room. In the emergency room her blood pressure remained in the 90s systolic to low 100s without fluids heart rate 108 paced rhythm and pulse ox 96% on room air. Patient stated that she felt fine now. She had a chest x-ray done that was normal cardiac markers were normal, lab work showed no significant change, EKG shows a paced rhythm. She stated that she started feeling bad right after  she took the Levaquin. Patient is being admitted for further treatment. Patient stated that she has had a headache and dizziness in the past she was worked up by neurology last year with no significant finding.   Consultants:  Cardiology  Gastroenterology  Procedures:  None  Antibiotics:  Zosyn  12/1 >>12/3  Levaquin 12/3 >>  HPI/Subjective: Pt states that she feels better than on admission. States that she has very little appetite..  Objective: Filed Vitals:   12/18/11 2200 12/19/11 0600 12/19/11 1005 12/19/11 1455  BP: 113/49 98/48 127/58 124/52  Pulse: 90 95 98 89  Temp: 98.8 F (37.1 C) 98.4 F (36.9 C)  98.6 F (37 C)  TempSrc: Oral Oral  Oral  Resp: 18 18  18   Height:      Weight:      SpO2: 100% 95% 97% 99%   Weight change:  No intake or output data in the 24 hours ending 12/19/11 1808  General: Alert, awake, oriented x3, in no acute distress.  HEENT: [place]/AT PEERL, EOMI Neck: Trachea midline,  no masses, no thyromegal,y no JVD, no carotid bruit OROPHARYNX:  Moist, No exudate/ erythema/lesions.  Heart: Regular rate and rhythm, without murmurs, rubs, gallops. palpation.  Lungs: Clear to auscultation, no wheezing or rhonchi noted. No increased vocal fremitus resonant to percussion  Abdomen: Soft, nontender, nondistended, positive bowel sounds, no masses no hepatosplenomegaly noted.  Neuro: No focal neurological deficits noted. Musculoskeletal: Patient has swelling around the left forearm with the IV infiltrated. No warmth or erythema noted.     Data Reviewed: Basic Metabolic Panel:  Lab 12/19/11 8469 12/18/11 0505 12/17/11 1603 12/17/11 0420 12/16/11 0430  NA 137 133* 133* 130* 133*  K 3.8 4.0 4.1 4.0 4.2  CL 107 103 102 101 102  CO2 21 21 21 21 21   GLUCOSE 99 146* 165* 119* 127*  BUN 16 20 18 18 22   CREATININE 1.83* 2.13* 2.10* 2.03* 2.54*  CALCIUM 8.1* 8.1* 8.6 7.9* 8.1*  MG -- -- -- -- --  PHOS -- -- -- -- 2.9   Liver Function Tests:  Lab 12/17/11 1603 12/16/11 0430 12/14/11 0943  AST 14 -- 17  ALT <5 -- <5  ALKPHOS 121* -- 139*  BILITOT 0.6 -- 0.4  PROT 6.2 -- 7.2  ALBUMIN 2.1* 1.8* 2.4*   No results found for this basename: LIPASE:5,AMYLASE:5 in the last 168 hours No results found for this basename: AMMONIA:5 in the last 168  hours CBC:  Lab 12/19/11 0841 12/17/11 1603 12/17/11 0420 12/16/11 0430 12/15/11 0650  WBC 4.7 8.2 5.8 6.1 6.2  NEUTROABS 3.2 6.5 -- -- --  HGB 7.5* 9.3* 8.2* 8.6* 9.3*  HCT 22.8* 28.8* 25.3* 26.9* 28.7*  MCV 77.6* 77.4* 76.9* 77.5* 76.9*  PLT 169 219 202 195 219   Cardiac Enzymes:  Lab 12/18/11 2358 12/18/11 1815  CKTOTAL -- --  CKMB -- --  CKMBINDEX -- --  TROPONINI <0.30 <0.30   BNP (last 3 results)  Basename 12/17/11 1603 12/15/11 0650 11/06/11 1400  PROBNP 2026.0* 1637.0* 2645.0*   CBG:  Lab 12/19/11 1635 12/19/11 1125 12/19/11 0748 12/18/11 2119 12/18/11 1639  GLUCAP 93 92 103* 94 102*    Recent Results (from the past 240 hour(s))  CULTURE, BLOOD (ROUTINE X 2)     Status: Normal (Preliminary result)   Collection Time   12/16/11 12:05 AM      Component Value Range Status Comment   Specimen Description BLOOD RIGHT HAND   Final  Special Requests BOTTLES DRAWN AEROBIC AND ANAEROBIC 10CC   Final    Culture  Setup Time 12/16/2011 03:04   Final    Culture     Final    Value:        BLOOD CULTURE RECEIVED NO GROWTH TO DATE CULTURE WILL BE HELD FOR 5 DAYS BEFORE ISSUING A FINAL NEGATIVE REPORT   Report Status PENDING   Incomplete   CULTURE, BLOOD (ROUTINE X 2)     Status: Normal (Preliminary result)   Collection Time   12/16/11 12:12 AM      Component Value Range Status Comment   Specimen Description BLOOD RIGHT ARM   Final    Special Requests BOTTLES DRAWN AEROBIC AND ANAEROBIC 10C   Final    Culture  Setup Time 12/16/2011 03:04   Final    Culture     Final    Value:        BLOOD CULTURE RECEIVED NO GROWTH TO DATE CULTURE WILL BE HELD FOR 5 DAYS BEFORE ISSUING A FINAL NEGATIVE REPORT   Report Status PENDING   Incomplete   URINE CULTURE     Status: Normal   Collection Time   12/16/11 11:06 AM      Component Value Range Status Comment   Specimen Description URINE, CLEAN CATCH   Final    Special Requests NONE   Final    Culture  Setup Time 12/16/2011 13:04    Final    Colony Count >=100,000 COLONIES/ML   Final    Culture     Final    Value: STAPHYLOCOCCUS SPECIES (COAGULASE NEGATIVE)     Note: RIFAMPIN AND GENTAMICIN SHOULD NOT BE USED AS SINGLE DRUGS FOR TREATMENT OF STAPH INFECTIONS.   Report Status 12/18/2011 FINAL   Final    Organism ID, Bacteria STAPHYLOCOCCUS SPECIES (COAGULASE NEGATIVE)   Final   URINE CULTURE     Status: Normal   Collection Time   12/17/11 11:35 PM      Component Value Range Status Comment   Specimen Description URINE, CATHETERIZED   Final    Special Requests NONE   Final    Culture  Setup Time 12/18/2011 09:23   Final    Colony Count NO GROWTH   Final    Culture NO GROWTH   Final    Report Status 12/19/2011 FINAL   Final      Studies: Ct Abdomen Pelvis Wo Contrast  12/17/2011  *RADIOLOGY REPORT*  Clinical Data: Severe nausea and vomiting.  Abdominal pain.  CT ABDOMEN AND PELVIS WITHOUT CONTRAST  Technique:  Multidetector CT imaging of the abdomen and pelvis was performed following the standard protocol without intravenous contrast.  Comparison: 06/16/2011  Findings: Probable tiny calcified gallstones again noted, however there is no evidence of cholecystitis or biliary ductal dilatation. Noncontrast images of the liver, pancreas, spleen, adrenal glands, and kidneys are unremarkable in appearance.  Chronic bilateral perinephric stranding again demonstrated as well as probable tiny fluid attenuation left renal cyst.  No evidence of renal calculi or hydronephrosis.  No soft tissue masses or lymphadenopathy identified within the abdomen or pelvis.  Uterus and adnexal regions are unremarkable. No evidence of inflammatory process or abnormal fluid collections. No evidence of dilated bowel loops or hernia.  Cardiomegaly and diffuse coronary artery calcifications again demonstrated, and pacemaker leads again noted in the right heart.  IMPRESSION:  1.  Stable exam.  No acute findings. 2.  Cholelithiasis, without evidence of acute  cholecystitis.   Original  Report Authenticated By: Myles Rosenthal, M.D.    Dg Chest Port 1 View  12/17/2011  *RADIOLOGY REPORT*  Clinical Data: Shortness of breath, pain, weakness, and vomiting  PORTABLE CHEST - 1 VIEW  Comparison: Chest radiograph 12/15/2011  Findings: Stable cardiomegaly in this patient status post median sternotomy for CABG.  Left chest wall dual lead pacer appears stable.  Lung volumes are low.  Stable elevation of the right hemidiaphragm.  The lungs appear clear.  No visible pleural effusion or pneumothorax. Visualized upper abdomen is unremarkable.  IMPRESSION: Stable chest radiograph.  No acute cardiopulmonary disease identified.   Original Report Authenticated By: Britta Mccreedy, M.D.    Dg Chest Port 1 View  12/15/2011  *RADIOLOGY REPORT*  Clinical Data: Fever.  PORTABLE CHEST - 1 VIEW  Comparison: 09/12/2011  Findings: Stable appearance of dual chamber pacemaker.  Lungs show no evidence of pulmonary edema or focal consolidation.  There is stable elevation of the right hemidiaphragm.  Heart size is stable status post prior CABG.  No significant pleural effusions are identified.  IMPRESSION: No active disease.   Original Report Authenticated By: Irish Lack, M.D.    Dg Foot Complete Right  12/17/2011  *RADIOLOGY REPORT*  Clinical Data: Right foot pain.  No known injury.  RIGHT FOOT COMPLETE - 3+ VIEW  Comparison: None.  Findings: Sock containing radiopaque material limits fine bone detail.  There is no evidence of acute fracture or dislocation.  No other significant bone abnormality identified.  Peripheral vascular calcification noted.  IMPRESSION: No acute findings.   Original Report Authenticated By: Myles Rosenthal, M.D.     Scheduled Meds:    . aspirin  81 mg Oral Daily  . atorvastatin  20 mg Oral q1800  . cilostazol  100 mg Oral BID  . clopidogrel  75 mg Oral Daily  . gabapentin  100 mg Oral BID  . insulin aspart  0-20 Units Subcutaneous TID WC  . insulin aspart  0-5 Units  Subcutaneous QHS  . levofloxacin  250 mg Oral Daily  . levothyroxine  50 mcg Oral QAC breakfast  . loratadine  10 mg Oral Daily  . nystatin  500,000 Units Oral QID  . pantoprazole  40 mg Oral Daily  . Vitamin D (Ergocalciferol)  50,000 Units Oral Q7 days  . [DISCONTINUED] piperacillin-tazobactam (ZOSYN)  IV  3.375 g Intravenous Q8H   Continuous Infusions:    . sodium chloride 50 mL/hr at 12/18/11 1640    Active Problems:  Microcytic anemia  Diabetes mellitus  Hypotension  Acute on chronic renal failure  Pacemaker, medtronic Adapta 06/29/06  secondary to symptomatic bradycardia and 2nd degree heart block  Hyponatremia  Infection, staphylococcal  Bacterial UTI  Sepsis due to other specified Staphylococcus  Anemia of chronic disease  Nausea with vomiting

## 2011-12-19 NOTE — Consult Note (Addendum)
 GI Attending  I have also seen and assessed the patient and agree with the above note.  I think anemia likely multifactorial, she is heme negative and has chronic kidney disease so GI blood loss less likely. Colonoscopy too aggressive for her and not clearly indicated.  Her nausea, anorexia, ? Early satiety and weight loss merit an EGD in this setting.  She and her son would like cardiology to ok this and possibly Dr. August Saucer.  I think EGD appropriate and not that risky overall though age and co-morbidities increase the risk it is appropriate.  Will follow-up tomorrow to see - ? EGD Thurs? Or if all agreeable can try for Wed but needs to be NPO after midnight so let me know  Iva Boop, MD, [facility] [phone] - cell

## 2011-12-19 NOTE — Progress Notes (Signed)
Pt's medtronic pacemaker was interrogated today.   She had 1 episode of PAF lasting 1 min 11sec in August of this year.  She has had PACs. Underlying rhythm is CHB and she is AV pacing and sensing appropriately.  Her thresholds were stable.

## 2011-12-20 DIAGNOSIS — R630 Anorexia: Secondary | ICD-10-CM

## 2011-12-20 DIAGNOSIS — N179 Acute kidney failure, unspecified: Secondary | ICD-10-CM

## 2011-12-20 DIAGNOSIS — N189 Chronic kidney disease, unspecified: Secondary | ICD-10-CM

## 2011-12-20 DIAGNOSIS — E119 Type 2 diabetes mellitus without complications: Secondary | ICD-10-CM

## 2011-12-20 DIAGNOSIS — D509 Iron deficiency anemia, unspecified: Secondary | ICD-10-CM

## 2011-12-20 LAB — GLUCOSE, CAPILLARY

## 2011-12-20 MED ORDER — BOOST / RESOURCE BREEZE PO LIQD: 1.0000 | ORAL | Status: DC | PRN

## 2011-12-20 MED ORDER — ALUM & MAG HYDROXIDE-SIMETH 200-200-20 MG/5ML PO SUSP
15.0000 mL | Freq: Once | ORAL | Status: AC
  Administered 2011-12-20: 15 mL via ORAL
  Filled 2011-12-20: qty 30

## 2011-12-20 NOTE — Progress Notes (Signed)
[facility] PROGRESS NOTE Interim History: 78 year old African American female that was admitted 2 days ago from Dr. Diamantina [facility] office with anemia for blood transfusion. While she was inpatient she developed a fever and was noted to have a bladder infection. Her blood pressure was also running low. She felt good on 12/1 and was discharged home with by mouth Levaquin to take and follow up with Dr. August Saucer this week. Patient given a dose of Levaquin prior to discharge. She stated that when she got home she got lightheaded and dizzy with some chest tightness and nausea vomiting once. She thinks that this happened secondary to taking all her medications on empty stomach. She normally takes her medication after she eats. EMS was called. Per family when EMS got there her blood pressure was in the 90s systolic and her pulse ox was 65% on room air. She was brought back to the emergency room. In the emergency room her blood pressure remained in the 90s systolic to low 100s without fluids heart rate 108 paced rhythm and pulse ox 96% on room air. Patient stated that she felt fine now. She had a chest x-ray done that was normal cardiac markers were normal, lab work showed no significant change, EKG shows a paced rhythm. She stated that she started feeling bad right after she took the Levaquin. Patient is being admitted for further treatment. Patient stated that she has had a headache and dizziness in the past she was worked up by neurology last year with no significant finding.    Assessment/Plan: Patient Active Hospital Problem List: Sepsis due to other specified Staphylococcus (12/18/2011) - Due UTI  Started on empric zosyn on 11.30.2013, Staph coagulase negative  UC 11.30.2013 sensitive to levaquin transition to on 12.3.2013. - afebrile.   Microcytic anemia (10/19/2011): -EGD thursady if family agrees. - palliative care consult for goals of care pending. - GI consulted agreed with EGD.   Diabetes mellitus  (10/19/2011) - good controlled.  Acute on chronic renal failure (12/14/2011) - resolved with IV fluids. Continues to decrease since admssion. - baseline cr. 1.6.  Hyponatremia (12/14/2011) - 2/2 to decrease intravascular volume. - resolved with IV fluids.   Code Status: Full Code  Family Communication: Daughter Alona Bene at bedside and updated  Disposition Plan: Home at time of discharge    Consultants:  GI  Palliative care  Procedures:  EGD 12.5.2013  Antibiotics:  levaquin  HPI/Subjective: Relates feeling better than on admission. Poor appetite.  Objective: Filed Vitals:   12/19/11 1005 12/19/11 1455 12/19/11 2200 12/20/11 0600  BP: 127/58 124/52 104/46 108/49  Pulse: 98 89 96 98  Temp:  98.6 F (37 C) 98.9 F (37.2 C) 99.1 F (37.3 C)  TempSrc:  Oral Oral Oral  Resp:  18 18 18   Height:      Weight:      SpO2: 97% 99% 98% 99%    Intake/Output Summary (Last 24 hours) at 12/20/11 1048 Last data filed at 12/20/11 0500  Gross per 24 hour  Intake   2150 ml  Output      0 ml  Net   2150 ml   Filed Weights   12/18/11 1754  Weight: 96.2 kg (212 lb 1.3 oz)    Exam:  General: Alert, awake, oriented x3, in no acute distress.  HEENT: No bruits, no goiter.  Heart: Regular rate and rhythm, without murmurs, rubs, gallops.  Lungs: Good air movement, good air movement CTA b/l Abdomen: Soft, nontender, nondistended, positive bowel sounds.  Neuro: Grossly intact, nonfocal.   Data Reviewed: Basic Metabolic Panel:  Lab 12/19/11 1610 12/18/11 0505 12/17/11 1603 12/17/11 0420 12/16/11 0430  NA 137 133* 133* 130* 133*  K 3.8 4.0 4.1 4.0 4.2  CL 107 103 102 101 102  CO2 21 21 21 21 21   GLUCOSE 99 146* 165* 119* 127*  BUN 16 20 18 18 22   CREATININE 1.83* 2.13* 2.10* 2.03* 2.54*  CALCIUM 8.1* 8.1* 8.6 7.9* 8.1*  MG -- -- -- -- --  PHOS -- -- -- -- 2.9   Liver Function Tests:  Lab 12/17/11 1603 12/16/11 0430 12/14/11 0943  AST 14 -- 17  ALT <5 -- <5   ALKPHOS 121* -- 139*  BILITOT 0.6 -- 0.4  PROT 6.2 -- 7.2  ALBUMIN 2.1* 1.8* 2.4*   No results found for this basename: LIPASE:5,AMYLASE:5 in the last 168 hours No results found for this basename: AMMONIA:5 in the last 168 hours CBC:  Lab 12/19/11 0841 12/17/11 1603 12/17/11 0420 12/16/11 0430 12/15/11 0650  WBC 4.7 8.2 5.8 6.1 6.2  NEUTROABS 3.2 6.5 -- -- --  HGB 7.5* 9.3* 8.2* 8.6* 9.3*  HCT 22.8* 28.8* 25.3* 26.9* 28.7*  MCV 77.6* 77.4* 76.9* 77.5* 76.9*  PLT 169 219 202 195 219   Cardiac Enzymes:  Lab 12/18/11 2358 12/18/11 1815  CKTOTAL -- --  CKMB -- --  CKMBINDEX -- --  TROPONINI <0.30 <0.30   BNP (last 3 results)  Basename 12/17/11 1603 12/15/11 0650 11/06/11 1400  PROBNP 2026.0* 1637.0* 2645.0*   CBG:  Lab 12/20/11 0747 12/19/11 2127 12/19/11 1635 12/19/11 1125 12/19/11 0748  GLUCAP 82 80 93 92 103*    Recent Results (from the past 240 hour(s))  CULTURE, BLOOD (ROUTINE X 2)     Status: Normal (Preliminary result)   Collection Time   12/16/11 12:05 AM      Component Value Range Status Comment   Specimen Description BLOOD RIGHT HAND   Final    Special Requests BOTTLES DRAWN AEROBIC AND ANAEROBIC 10CC   Final    Culture  Setup Time 12/16/2011 03:04   Final    Culture     Final    Value:        BLOOD CULTURE RECEIVED NO GROWTH TO DATE CULTURE WILL BE HELD FOR 5 DAYS BEFORE ISSUING A FINAL NEGATIVE REPORT   Report Status PENDING   Incomplete   CULTURE, BLOOD (ROUTINE X 2)     Status: Normal (Preliminary result)   Collection Time   12/16/11 12:12 AM      Component Value Range Status Comment   Specimen Description BLOOD RIGHT ARM   Final    Special Requests BOTTLES DRAWN AEROBIC AND ANAEROBIC 10C   Final    Culture  Setup Time 12/16/2011 03:04   Final    Culture     Final    Value:        BLOOD CULTURE RECEIVED NO GROWTH TO DATE CULTURE WILL BE HELD FOR 5 DAYS BEFORE ISSUING A FINAL NEGATIVE REPORT   Report Status PENDING   Incomplete   URINE CULTURE      Status: Normal   Collection Time   12/16/11 11:06 AM      Component Value Range Status Comment   Specimen Description URINE, CLEAN CATCH   Final    Special Requests NONE   Final    Culture  Setup Time 12/16/2011 13:04   Final    Colony Count >=100,000 COLONIES/ML  Final    Culture     Final    Value: STAPHYLOCOCCUS SPECIES (COAGULASE NEGATIVE)     Note: RIFAMPIN AND GENTAMICIN SHOULD NOT BE USED AS SINGLE DRUGS FOR TREATMENT OF STAPH INFECTIONS.   Report Status 12/18/2011 FINAL   Final    Organism ID, Bacteria STAPHYLOCOCCUS SPECIES (COAGULASE NEGATIVE)   Final   URINE CULTURE     Status: Normal   Collection Time   12/17/11 11:35 PM      Component Value Range Status Comment   Specimen Description URINE, CATHETERIZED   Final    Special Requests NONE   Final    Culture  Setup Time 12/18/2011 09:23   Final    Colony Count NO GROWTH   Final    Culture NO GROWTH   Final    Report Status 12/19/2011 FINAL   Final      Studies: No results found.  Scheduled Meds:    . [COMPLETED] alum & mag hydroxide-simeth  15 mL Oral Once  . aspirin  81 mg Oral Daily  . atorvastatin  20 mg Oral q1800  . cilostazol  100 mg Oral BID  . clopidogrel  75 mg Oral Daily  . gabapentin  100 mg Oral BID  . insulin aspart  0-20 Units Subcutaneous TID WC  . insulin aspart  0-5 Units Subcutaneous QHS  . levofloxacin  250 mg Oral Daily  . levothyroxine  50 mcg Oral QAC breakfast  . loratadine  10 mg Oral Daily  . nystatin  500,000 Units Oral QID  . pantoprazole  40 mg Oral Daily  . Vitamin D (Ergocalciferol)  50,000 Units Oral Q7 days  . [DISCONTINUED] piperacillin-tazobactam (ZOSYN)  IV  3.375 g Intravenous Q8H   Continuous Infusions:    . sodium chloride 50 mL/hr at 12/18/11 1640     [name]  [facility] Pager [phone]. If 8PM-8AM, please contact night-coverage at [email or web address], password [facility] 12/20/2011, 10:48 AM  LOS: 3 days

## 2011-12-20 NOTE — Progress Notes (Signed)
[facility] Progress Note  Subjective:  Feels about the same.  Still awaiting consult from palliative care to determine goals of care and decide on EGD.  If they decide to proceed, then will be performed on 12/6.  Objective:  Vital signs in last 24 hours: Temp:  [98.6 F (37 C)-99.1 F (37.3 C)] 99.1 F (37.3 C) (12/04 0600) Pulse Rate:  [89-98] 98  (12/04 0600) Resp:  [18] 18  (12/04 0600) BP: (104-124)/(46-52) 108/49 mmHg (12/04 0600) SpO2:  [98 %-99 %] 99 % (12/04 0600) Last BM Date: 12/19/11 General:   Alert, frail, pleasant and cooperative, in NAD Heart:  Regular rate and rhythm; no murmurs Pulm:  CTAB.  No W/R/R. Abdomen:  Soft, non-distended.  BS present.  Mild epigastric TTP without R/R/G. Extremities:  Without edema. Neurologic:  Alert and  oriented x4;  grossly normal neurologically. Psych:  Alert and cooperative. Normal mood and affect.  Intake/Output from previous day: 12/03 0701 - 12/04 0700 In: 2150 [I.V.:2150] Out: -   Lab Results:  Basename 12/19/11 0841 12/17/11 1603  WBC 4.7 8.2  HGB 7.5* 9.3*  HCT 22.8* 28.8*  PLT 169 219   BMET  Basename 12/19/11 0841 12/18/11 0505 12/17/11 1603  NA 137 133* 133*  K 3.8 4.0 4.1  CL 107 103 102  CO2 21 21 21   GLUCOSE 99 146* 165*  BUN 16 20 18   CREATININE 1.83* 2.13* 2.10*  CALCIUM 8.1* 8.1* 8.6   LFT  Basename 12/17/11 1603  PROT 6.2  ALBUMIN 2.1*  AST 14  ALT <5  ALKPHOS 121*  BILITOT 0.6  BILIDIR --  IBILI --   Assessment / Plan: -Anemia: Likely anemia of chronic disease.  Hgb had drifted down yesterday, but no new labs yet today.  Heme negative in early November.  -Nausea, vomiting, and weight loss  -Acute on chronic renal disease.  Cr improving.  -CHF and other cardiac comorbidities   -Monitor Hgb and transfuse prn.  -Will await palliative care consult to determine goals of care and decision regarding EGD.  If we are to proceed, then will be performed on 12/6.     LOS: 3 days    ZEHR, JESSICA D.  12/20/2011, 10:29 AM  Pager number [phone]  [facility] Attending  I have also seen and assessed the patient and agree with the above note. Spoke with daughter also - explained that a diagnostic EGD is an option and should be safe but depending upon palliative care results may not make any difference. We will wait to see - am not "pushing" to do an EGD but is reasonable. My sense is that pursuing comfort care approach is very sensible.  Iva Boop, MD, [facility] [phone] (pager) 12/20/2011 12:34 PM

## 2011-12-20 NOTE — Progress Notes (Addendum)
Addendum: call back received to team phone -family confirms PMT meeting time for tomorrow Thursday @ 4:00 pm Valente David, RN   Palliative Medicine Team consult for goals of care requested by Dr Ashley Royalty; spoke with patient's daughter Zelphia Cairo "Marcelino Duster" at bedside via room phone- she will speak with her siblings and tentatively committed to meeting time for Thursday 12/21/11 @ 4:00 pm  She will call back to PMT phone to confirm all siblings will be available at this time  Valente David, RN 12/20/2011, 11:56 AM Palliative Medicine Team RN Liaison [phone]

## 2011-12-20 NOTE — Progress Notes (Signed)
INITIAL ADULT NUTRITION ASSESSMENT Date: 12/20/2011   Time: 11:40 AM Reason for Assessment: Consult  INTERVENTION: Resource Breeze PRN as tolerated for additional nutrition. Awaiting goals of nutrition from palliative care meeting tomorrow. Will monitor.   Pt meets criteria for severe malnutrition AEB <50% energy intake for the past year with 12.4% weight loss in the past 2 months per pt report.   ASSESSMENT: Female 78 y.o.  Dx: Sepsis due to other specified Staphylococcus  Food/Nutrition Related Hx: Pt and daughter report pt with ongoing poor appetite for the past year. Pt reports every time she tried to eat something, she would get nauseated and sometimes vomit. Pt reports the doctor told her she had lost 30 pounds in the past 2 months. Pt reports she could just smell food and be nauseated. Daughter, Marcelino Duster, reports the only foods pt could eat at home were cheerios, macaroni and cheese, and mashed potatoes. Pt reports only eating teaspoon amounts of these foods at home.   Hx:  Past Medical History  Diagnosis Date  . Diabetes mellitus   . Hypertension   . MI (myocardial infarction)   . Coronary artery disease   . Legally blind    Related Meds:  Scheduled Meds:   . [COMPLETED] alum & mag hydroxide-simeth  15 mL Oral Once  . aspirin  81 mg Oral Daily  . atorvastatin  20 mg Oral q1800  . cilostazol  100 mg Oral BID  . clopidogrel  75 mg Oral Daily  . gabapentin  100 mg Oral BID  . insulin aspart  0-20 Units Subcutaneous TID WC  . insulin aspart  0-5 Units Subcutaneous QHS  . levofloxacin  250 mg Oral Daily  . levothyroxine  50 mcg Oral QAC breakfast  . loratadine  10 mg Oral Daily  . nystatin  500,000 Units Oral QID  . pantoprazole  40 mg Oral Daily  . Vitamin D (Ergocalciferol)  50,000 Units Oral Q7 days  . [DISCONTINUED] piperacillin-tazobactam (ZOSYN)  IV  3.375 g Intravenous Q8H   Continuous Infusions:   . sodium chloride 50 mL/hr at 12/18/11 1640   PRN  Meds:.acetaminophen, acetaminophen, alum & mag hydroxide-simeth, ondansetron (ZOFRAN) IV, ondansetron, zolpidem, [DISCONTINUED] loratadine  Ht: 5\' 4"  (162.6 cm)  Wt: 212 lb 1.3 oz (96.2 kg) Non-pitting RLE and LLE edema   Ideal Wt: 120 lb % Ideal Wt: 177  Usual Wt: 210 lb % Usual Wt: 101  Wt Readings from Last 10 Encounters:  12/18/11 212 lb 1.3 oz (96.2 kg)  12/17/11 212 lb 1.3 oz (96.2 kg)  10/19/11 195 lb 12.8 oz (88.814 kg)  04/23/11 223 lb (101.152 kg)      Body mass index is 36.40 kg/(m^2). Class II obesity   Labs:  CMP     Component Value Date/Time   NA 137 12/19/2011 0841   K 3.8 12/19/2011 0841   CL 107 12/19/2011 0841   CO2 21 12/19/2011 0841   GLUCOSE 99 12/19/2011 0841   BUN 16 12/19/2011 0841   CREATININE 1.83* 12/19/2011 0841   CALCIUM 8.1* 12/19/2011 0841   PROT 6.2 12/17/2011 1603   ALBUMIN 2.1* 12/17/2011 1603   AST 14 12/17/2011 1603   ALT <5 12/17/2011 1603   ALKPHOS 121* 12/17/2011 1603   BILITOT 0.6 12/17/2011 1603   GFRNONAA 23* 12/19/2011 0841   GFRAA 27* 12/19/2011 0841   Lab Results  Component Value Date   HGBA1C 7.7* 12/14/2011   CBG (last 3)   Basename 12/20/11 1157 12/20/11 0747 12/19/11  2127  GLUCAP 90 82 80    Intake/Output Summary (Last 24 hours) at 12/20/11 1348 Last data filed at 12/20/11 0500  Gross per 24 hour  Intake   2150 ml  Output      0 ml  Net   2150 ml   Last BM - 12/3  Diet Order: General   IVF:    sodium chloride Last Rate: 50 mL/hr at 12/18/11 1640    Estimated Nutritional Needs:   Kcal: 1650-1850 Protein: 65-85g  Fluid: 1.6-1.8L  NUTRITION DIAGNOSIS: -Inadequate oral intake (NI-2.1).  Status: Ongoing  RELATED TO: nausea/vomiting after eating  AS EVIDENCE BY: pt statement  MONITORING/EVALUATION(Goals):  1. Resolution of nausea/vomiting  2. Nutrition per goals of care - scheduled for tomorrow  EDUCATION NEEDS: -No education needs identified at this time  Dietitian #: [phone]  DOCUMENTATION  CODES Per approved criteria  -Severe malnutrition in the context of chronic illness -Obesity Unspecified    Marshall Cork 12/20/2011, 11:40 AM

## 2011-12-21 DIAGNOSIS — R5383 Other fatigue: Secondary | ICD-10-CM

## 2011-12-21 LAB — GLUCOSE, CAPILLARY
Glucose-Capillary: 98 mg/dL (ref 70–99)
Glucose-Capillary: 101 mg/dL — ABNORMAL HIGH (ref 70–99)

## 2011-12-21 LAB — CBC
HCT: 23.5 % — ABNORMAL LOW (ref 36.0–46.0)
MCV: 76.5 fL — ABNORMAL LOW (ref 78.0–100.0)
RBC: 3.07 MIL/uL — ABNORMAL LOW (ref 3.87–5.11)
WBC: 4.4 10*3/uL (ref 4.0–10.5)

## 2011-12-21 NOTE — Progress Notes (Signed)
[name]      DOB: [birth year]      JWJ:[id number]  Patient is a pleasant, oriented 78 yo AAF, with PMH CAD,CKD, DM recently discharged s/p blood transfusion for anemia and treatment for UTI. Readmitted 12/17/11, after taking Levaquin dose orally and became dizzy and lightheaded, accompanied by chest tightness, nausea and small emesis. CXR, EKG and cardiac enzyme work up was normal. Patient admits to 30 lb weight loss over past year.She was admitted for further work up, found to be anemic, and is currently scheduled for EGD tomorrow to evaluate.   Palliative Goals of Care meeting completed today with participation from patient, sons, Willette Brace, daughters, Briant Sites (ob phone) and grand-daughter Philippa Chester.   Goals of Care as follows:  Code Status: Full-cardiac/pulmonary resuscitation and support, including cardioversion, vasopressor and antiarrythmic intervention, as well as intubation and ventilatory support  Level of Care: Full scope of medical treatment, monitoring and necessary interventions to treat and stabilize medical status, including ongoing transfusion support.  Agreeable to have EGD done 12/6.  Family emphasized that goals of care decisions may change dependent on results of EGD  Full Palliative care note to follow.  [name], CNS-C Palliative Medicine Team [facility] Team Phone: [phone] Pager: [phone]

## 2011-12-21 NOTE — Progress Notes (Signed)
[facility] Progress Note  Subjective:  Feels ok today.  Will tentatively plan for EGD on 12/6.  Objective:  Vital signs in last 24 hours: Temp:  [98.9 F (37.2 C)-99.3 F (37.4 C)] 99.3 F (37.4 C) (12/05 0427) Pulse Rate:  [98-102] 102  (12/05 0427) Resp:  [18] 18  (12/05 0427) BP: (90-111)/(50-66) 90/50 mmHg (12/05 0427) SpO2:  [97 %-100 %] 100 % (12/05 0427) Last BM Date: 12/20/11 (small X2) General:   Alert, thin and frail, in NAD Heart:  Regular rate and rhythm; no murmurs Pulm:  CTAB.  No W/R/R. Abdomen:  Soft, nontender and nondistended. Normal bowel sounds, without guarding, and without rebound.   Extremities:  Without edema. Neurologic:  Alert and  oriented x4;  grossly normal neurologically. Psych:  Alert and cooperative. Normal mood and affect.   Lab Results:  [facility] 12/19/11 0841  WBC 4.7  HGB 7.5*  HCT 22.8*  PLT 169   BMET  Basename 12/19/11 0841  NA 137  K 3.8  CL 107  CO2 21  GLUCOSE 99  BUN 16  CREATININE 1.83*  CALCIUM 8.1*   Assessment / Plan: -Anemia: Likely anemia of chronic disease. Hgb had drifted down on 12/3, but no new labs yesterday or today. Heme negative in early November.  -Nausea, vomiting, and weight loss  -Acute on chronic renal disease. Cr improving.  -CHF and other cardiac comorbidities   *Monitor Hgb and transfuse prn.  Will recheck a CBC today. *Will proceed with EGD on 12/6.    LOS: 4 days   ZEHR, JESSICA D.  12/21/2011, 9:32 AM  Pager number [phone]    [facility] Attending  I have also seen and assessed the patient and agree with the above note. EGD tomorrow to evaluate weight loss, nausea/vomiting, anemia.  Iva Boop, MD, [facility] [phone] (pager) 12/21/2011 7:29 PM

## 2011-12-21 NOTE — Progress Notes (Signed)
[facility] PROGRESS NOTE Interim History: 78 year old African American female that was admitted 2 days ago from Dr. Diamantina Providence office with anemia for blood transfusion. While she was inpatient she developed a fever and was noted to have a bladder infection. Her blood pressure was also running low. She felt good on 12/1 and was discharged home with by mouth Levaquin to take and follow up with Dr. August Saucer this week. Patient given a dose of Levaquin prior to discharge. She stated that when she got home she got lightheaded and dizzy with some chest tightness and nausea vomiting once. She thinks that this happened secondary to taking all her medications on empty stomach. She normally takes her medication after she eats. EMS was called. Per family when EMS got there her blood pressure was in the 90s systolic and her pulse ox was 16% on room air. She was brought back to the emergency room. In the emergency room her blood pressure remained in the 90s systolic to low 100s without fluids heart rate 108 paced rhythm and pulse ox 96% on room air. Patient stated that she felt fine now. She had a chest x-ray done that was normal cardiac markers were normal, lab work showed no significant change, EKG shows a paced rhythm. She stated that she started feeling bad right after she took the Levaquin. Patient is being admitted for further treatment. Patient stated that she has had a headache and dizziness in the past she was worked up by neurology last year with no significant finding.    Assessment/Plan: Patient Active Hospital Problem List: Sepsis due to other specified Staphylococcus (12/18/2011) - Due UTI  Started on empric zosyn on 11.30.2013, Staph coagulase negative  UC 11.30.2013 sensitive to levaquin transition to on 12.3.2013. Will treat for 7 days if Blood cultures remained negative. - afebrile.   Microcytic anemia (10/19/2011): -EGD thursady if family agrees. After palliative care meeting. - palliative care  consult for goals of care pending. - GI consulted agreed with EGD.   Diabetes mellitus (10/19/2011) - good controlled.  Acute on chronic renal failure (12/14/2011) - resolved with IV fluids. Continues to decrease since admssion. - baseline cr. 1.6.  Hyponatremia (12/14/2011) - 2/2 to decrease intravascular volume. - resolved with IV fluids.   Code Status: Full Code  Family Communication: Daughter Alona Bene at bedside and updated  Disposition Plan: Home at time of discharge    Consultants:  GI  Palliative care  Procedures:  EGD 12.5.2013  Antibiotics:  levaquin  HPI/Subjective: Relates feeling better than on admission. Poor appetite.  Objective: Filed Vitals:   12/19/11 2200 12/20/11 0600 12/20/11 2034 12/21/11 0427  BP: 104/46 108/49 111/66 90/50  Pulse: 96 98 98 102  Temp: 98.9 F (37.2 C) 99.1 F (37.3 C) 98.9 F (37.2 C) 99.3 F (37.4 C)  TempSrc: Oral Oral Oral Oral  Resp: 18 18 18 18   Height:      Weight:      SpO2: 98% 99% 97% 100%   No intake or output data in the 24 hours ending 12/21/11 0848 Filed Weights   12/18/11 1754  Weight: 96.2 kg (212 lb 1.3 oz)    Exam:  General: Alert, awake, oriented x3, in no acute distress.  Heart: Regular rate and rhythm, without murmurs, rubs, gallops.  Lungs: Good air movement, good air movement CTA b/l Abdomen: Soft, nontender, nondistended, positive bowel sounds.     Data Reviewed: Basic Metabolic Panel:  Lab 12/19/11 1096 12/18/11 0505 12/17/11 1603 12/17/11 0420 12/16/11 0430  NA 137 133* 133* 130* 133*  K 3.8 4.0 4.1 4.0 4.2  CL 107 103 102 101 102  CO2 21 21 21 21 21   GLUCOSE 99 146* 165* 119* 127*  BUN 16 20 18 18 22   CREATININE 1.83* 2.13* 2.10* 2.03* 2.54*  CALCIUM 8.1* 8.1* 8.6 7.9* 8.1*  MG -- -- -- -- --  PHOS -- -- -- -- 2.9   Liver Function Tests:  Lab 12/17/11 1603 12/16/11 0430 12/14/11 0943  AST 14 -- 17  ALT <5 -- <5  ALKPHOS 121* -- 139*  BILITOT 0.6 -- 0.4  PROT 6.2 --  7.2  ALBUMIN 2.1* 1.8* 2.4*   No results found for this basename: LIPASE:5,AMYLASE:5 in the last 168 hours No results found for this basename: AMMONIA:5 in the last 168 hours CBC:  Lab 12/19/11 0841 12/17/11 1603 12/17/11 0420 12/16/11 0430 12/15/11 0650  WBC 4.7 8.2 5.8 6.1 6.2  NEUTROABS 3.2 6.5 -- -- --  HGB 7.5* 9.3* 8.2* 8.6* 9.3*  HCT 22.8* 28.8* 25.3* 26.9* 28.7*  MCV 77.6* 77.4* 76.9* 77.5* 76.9*  PLT 169 219 202 195 219   Cardiac Enzymes:  Lab 12/18/11 2358 12/18/11 1815  CKTOTAL -- --  CKMB -- --  CKMBINDEX -- --  TROPONINI <0.30 <0.30   BNP (last 3 results)  Basename 12/17/11 1603 12/15/11 0650 11/06/11 1400  PROBNP 2026.0* 1637.0* 2645.0*   CBG:  Lab 12/20/11 2132 12/20/11 1650 12/20/11 1157 12/20/11 0747 12/19/11 2127  GLUCAP 92 85 90 82 80    Recent Results (from the past 240 hour(s))  CULTURE, BLOOD (ROUTINE X 2)     Status: Normal (Preliminary result)   Collection Time   12/16/11 12:05 AM      Component Value Range Status Comment   Specimen Description BLOOD RIGHT HAND   Final    Special Requests BOTTLES DRAWN AEROBIC AND ANAEROBIC 10CC   Final    Culture  Setup Time 12/16/2011 03:04   Final    Culture     Final    Value:        BLOOD CULTURE RECEIVED NO GROWTH TO DATE CULTURE WILL BE HELD FOR 5 DAYS BEFORE ISSUING A FINAL NEGATIVE REPORT   Report Status PENDING   Incomplete   CULTURE, BLOOD (ROUTINE X 2)     Status: Normal (Preliminary result)   Collection Time   12/16/11 12:12 AM      Component Value Range Status Comment   Specimen Description BLOOD RIGHT ARM   Final    Special Requests BOTTLES DRAWN AEROBIC AND ANAEROBIC 10C   Final    Culture  Setup Time 12/16/2011 03:04   Final    Culture     Final    Value:        BLOOD CULTURE RECEIVED NO GROWTH TO DATE CULTURE WILL BE HELD FOR 5 DAYS BEFORE ISSUING A FINAL NEGATIVE REPORT   Report Status PENDING   Incomplete   URINE CULTURE     Status: Normal   Collection Time   12/16/11 11:06 AM       Component Value Range Status Comment   Specimen Description URINE, CLEAN CATCH   Final    Special Requests NONE   Final    Culture  Setup Time 12/16/2011 13:04   Final    Colony Count >=100,000 COLONIES/ML   Final    Culture     Final    Value: STAPHYLOCOCCUS SPECIES (COAGULASE NEGATIVE)     Note: RIFAMPIN  AND GENTAMICIN SHOULD NOT BE USED AS SINGLE DRUGS FOR TREATMENT OF STAPH INFECTIONS.   Report Status 12/18/2011 FINAL   Final    Organism ID, Bacteria STAPHYLOCOCCUS SPECIES (COAGULASE NEGATIVE)   Final   URINE CULTURE     Status: Normal   Collection Time   12/17/11 11:35 PM      Component Value Range Status Comment   Specimen Description URINE, CATHETERIZED   Final    Special Requests NONE   Final    Culture  Setup Time 12/18/2011 09:23   Final    Colony Count NO GROWTH   Final    Culture NO GROWTH   Final    Report Status 12/19/2011 FINAL   Final      Studies: No results found.  Scheduled Meds:    . aspirin  81 mg Oral Daily  . atorvastatin  20 mg Oral q1800  . cilostazol  100 mg Oral BID  . clopidogrel  75 mg Oral Daily  . gabapentin  100 mg Oral BID  . insulin aspart  0-20 Units Subcutaneous TID WC  . insulin aspart  0-5 Units Subcutaneous QHS  . levofloxacin  250 mg Oral Daily  . levothyroxine  50 mcg Oral QAC breakfast  . loratadine  10 mg Oral Daily  . nystatin  500,000 Units Oral QID  . pantoprazole  40 mg Oral Daily  . Vitamin D (Ergocalciferol)  50,000 Units Oral Q7 days   Continuous Infusions:    . sodium chloride 50 mL/hr at 12/18/11 1640     FELIZ Rosine Beat  [facility] Pager [phone]. If 8PM-8AM, please contact night-coverage at [email or web address], password [facility] 12/21/2011, 8:48 AM  LOS: 4 days

## 2011-12-21 NOTE — Consult Note (Signed)
Patient ZO:XWRUEAVWU R Amory      DOB: 1921/01/08      JWJ:[id number]     Consult Note from the Palliative Medicine Team at [facility]    Consult Requested by:Dr Marthann Schiller      PCP: August Saucer, [name], MD Reason for Consultation:Goals of Care    Phone [phone]  Assessment of patients Current state: Patient is a pleasant, oriented lying in bed, she was able to verbalize her wishes pertinent to goals of care. She appeared comfortable, does have c/o about intermittent indigestion and belching over past several months. Patient is legally blind  Reviewed chart, spoke with staff caring for patient and proceeded to have Goals of Care meeting with patient, sons, Jonny Ruiz and Tiburcio Bash, daughters Briant Sites (on-phone) and [employer] (on-phone). Per family patient was semi-functional requiring assistance at times, lives with daughter, but all family members assist with care. They have noticed a decrease in patients appetite, accompanied by weight loss over past 3 months.   We discussed patients current issue of anemia over past month and need for intermittent transfusions to maintain hematologic stability. Patient and family are agreeable to have EGD procedure completed tomorrow to find out cause of recent bouts of anemia and symptoms. They stated that if testing reveals a progressive condition , such as malignancy that patients current goals and direction of care will be re-evaluated.  Discussed the philosophy of palliative care and hospice support and services provided. Also engaged in decision-making with family and patient regarding concepts of Medical Orders for Scope of Treatment pertaining to cardiac and pulmonary resuscitation, desire for acute future interventions for acute issues, the use of antibiotic therapy, intravenous hydration and continuing artificial tube feedings. A MOST form was discussed, completed , signed and placed on chart. Family/patient informed decisions  can be changed based on future desires.   Goals of Care/Scope of Treatment: Code Status: Full-cardiac/pulmonary resuscitation and support, including cardioversion, vasopressor and antiarrythmic intervention, as well as intubation and ventilatory support   Level of Care: Full scope of medical treatment, monitoring and necessary interventions to treat and stabilize medical status, including ongoing transfusion support.   Agreeable to have EGD done 12/6.  Family emphasized that goals of care decisions may change dependent on results of EGD   4. Disposition: family/patient plan at this time is to have patient return home    3. Symptom Management:  1. Pain:Tylenol as needed- intermitent R foot pain 2. Indigestion/GI Reflux: on PPI 3. Fever: Tylenol as needed 4. Generalized fatigue: related to anemia  4. Psychosocial:Emotional support and information rendered to assist patient/family in decision making process  5. Spiritual: consult placed   Brief HPI: Patient is a pleasant, oriented 78 yo AAF, with PMH CAD,CKD, DM recently discharged s/p blood transfusion for anemia and treatment for UTI. Readmitted 12/17/11, after taking Levaquin dose orally and became dizzy and lightheaded, accompanied by chest tightness, nausea and small emesis. CXR, EKG and cardiac enzyme work up was normal. Patient admits to 30 lb weight loss over past year.She was admitted for further work up, found to be anemic, and is currently scheduled for EGD tomorrow to evaluate.   ROS: + indigestion/belching, + gastric regurgitation, denies anxiety, depression, constipation    PMH:  Past Medical History  Diagnosis Date  . Diabetes mellitus   . Hypertension   . MI (myocardial infarction)   . Coronary artery disease   . Legally blind      PSH: Past Surgical History  Procedure Date  .  Pacemaker insertion   . Coronary artery bypass graft   . Cardiac catheterization    I have reviewed the FH and SH and  If  appropriate update it with new information. Allergies  Allergen Reactions  . Darvon   . Other     Tylenol #3  . Penicillins Other (See Comments)    Was told had allergy from childhood... Unknown reaction  . Percocet (Oxycodone-Acetaminophen)   . Percodan (Oxycodone-Aspirin)   . Vicodin (Hydrocodone-Acetaminophen)    Scheduled Meds:   . aspirin  81 mg Oral Daily  . atorvastatin  20 mg Oral q1800  . cilostazol  100 mg Oral BID  . clopidogrel  75 mg Oral Daily  . gabapentin  100 mg Oral BID  . insulin aspart  0-20 Units Subcutaneous TID WC  . insulin aspart  0-5 Units Subcutaneous QHS  . levofloxacin  250 mg Oral Daily  . levothyroxine  50 mcg Oral QAC breakfast  . loratadine  10 mg Oral Daily  . nystatin  500,000 Units Oral QID  . pantoprazole  40 mg Oral Daily  . Vitamin D (Ergocalciferol)  50,000 Units Oral Q7 days   Continuous Infusions:   . sodium chloride 50 mL/hr at 12/18/11 1640   PRN Meds:.acetaminophen, acetaminophen, alum & mag hydroxide-simeth, feeding supplement, ondansetron (ZOFRAN) IV, ondansetron, zolpidem    BP 125/72  Pulse 76  Temp 98.4 F (36.9 C) (Oral)  Resp 18  Ht 5\' 4"  (1.626 m)  Wt 88.2 kg (194 lb 7.1 oz)  BMI 33.38 kg/m2  SpO2 99%   PPS:60%   Intake/Output Summary (Last 24 hours) at 12/21/11 1952 Last data filed at 12/21/11 1500  Gross per 24 hour  Intake    360 ml  Output      0 ml  Net    360 ml   LBM: prior to admission                Physical Exam:  General: Pleasant, alert, oriented HEENT: anicteric, buccal mucosa moist Chest: CTA, diminished at bases CVS:RRR Abdomen:soft, non-tender, BS present Ext: trace pedal edema Neuro:alert/oriented  Labs: CBC    Component Value Date/Time   WBC 4.4 12/21/2011 1000   RBC 3.07* 12/21/2011 1000   HGB 7.7* 12/21/2011 1000   HCT 23.5* 12/21/2011 1000   PLT 178 12/21/2011 1000   MCV 76.5* 12/21/2011 1000   MCH 25.1* 12/21/2011 1000   MCHC 32.8 12/21/2011 1000   RDW 18.0* 12/21/2011  1000   LYMPHSABS 1.0 12/19/2011 0841   MONOABS 0.6 12/19/2011 0841   EOSABS 0.1 12/19/2011 0841   BASOSABS 0.0 12/19/2011 0841    BMET    Component Value Date/Time   NA 137 12/19/2011 0841   K 3.8 12/19/2011 0841   CL 107 12/19/2011 0841   CO2 21 12/19/2011 0841   GLUCOSE 99 12/19/2011 0841   BUN 16 12/19/2011 0841   CREATININE 1.83* 12/19/2011 0841   CALCIUM 8.1* 12/19/2011 0841   GFRNONAA 23* 12/19/2011 0841   GFRAA 27* 12/19/2011 0841    CMP     Component Value Date/Time   NA 137 12/19/2011 0841   K 3.8 12/19/2011 0841   CL 107 12/19/2011 0841   CO2 21 12/19/2011 0841   GLUCOSE 99 12/19/2011 0841   BUN 16 12/19/2011 0841   CREATININE 1.83* 12/19/2011 0841   CALCIUM 8.1* 12/19/2011 0841   PROT 6.2 12/17/2011 1603   ALBUMIN 2.1* 12/17/2011 1603   AST 14 12/17/2011 1603  ALT <5 12/17/2011 1603   ALKPHOS 121* 12/17/2011 1603   BILITOT 0.6 12/17/2011 1603   GFRNONAA 23* 12/19/2011 0841   GFRAA 27* 12/19/2011 0841   Time In Time Out Total Time Spent with Patient Total Overall Time  4:00p 5:30p 60 min 90 min    Greater than 50%  of this time was spent counseling and coordinating care related to the above assessment and plan.  Freddie Breech, CNS-C Palliative Medicine Team [facility] Team Phone: [phone] Pager: [phone]

## 2011-12-22 ENCOUNTER — Encounter (HOSPITAL_COMMUNITY)
Admission: EM | Disposition: A | Discharge status: Home Health | Payer: Self-pay | Source: Home / Self Care | Attending: Internal Medicine

## 2011-12-22 ENCOUNTER — Encounter (HOSPITAL_COMMUNITY): Payer: Self-pay | Admitting: *Deleted

## 2011-12-22 DIAGNOSIS — K259 Gastric ulcer, unspecified as acute or chronic, without hemorrhage or perforation: Secondary | ICD-10-CM | POA: Clinically undetermined

## 2011-12-22 DIAGNOSIS — K3189 Other diseases of stomach and duodenum: Secondary | ICD-10-CM

## 2011-12-22 HISTORY — PX: ESOPHAGOGASTRODUODENOSCOPY: SHX5428

## 2011-12-22 LAB — GLUCOSE, CAPILLARY
Glucose-Capillary: 103 mg/dL — ABNORMAL HIGH (ref 70–99)
Glucose-Capillary: 104 mg/dL — ABNORMAL HIGH (ref 70–99)

## 2011-12-22 LAB — CULTURE, BLOOD (ROUTINE X 2): Culture: NO GROWTH

## 2011-12-22 SURGERY — EGD (ESOPHAGOGASTRODUODENOSCOPY)
Anesthesia: Moderate Sedation

## 2011-12-22 MED ORDER — SODIUM CHLORIDE 0.9 % IV SOLN: INTRAVENOUS | Status: DC

## 2011-12-22 MED ORDER — BACLOFEN 5 MG HALF TABLET
5.0000 mg | ORAL_TABLET | Freq: Three times a day (TID) | ORAL | Status: DC | PRN
  Filled 2011-12-22: qty 1

## 2011-12-22 MED ORDER — FENTANYL CITRATE 0.05 MG/ML IJ SOLN
INTRAMUSCULAR | Status: AC
  Filled 2011-12-22: qty 2

## 2011-12-22 MED ORDER — FENTANYL CITRATE 0.05 MG/ML IJ SOLN
INTRAMUSCULAR | Status: DC | PRN
  Administered 2011-12-22 (×2): 12.5 ug via INTRAVENOUS

## 2011-12-22 MED ORDER — MIDAZOLAM HCL 10 MG/2ML IJ SOLN
INTRAMUSCULAR | Status: AC
  Filled 2011-12-22: qty 2

## 2011-12-22 MED ORDER — MIDAZOLAM HCL 10 MG/2ML IJ SOLN
INTRAMUSCULAR | Status: DC | PRN
  Administered 2011-12-22 (×2): 1 mg via INTRAVENOUS

## 2011-12-22 NOTE — Progress Notes (Signed)
[facility] PROGRESS NOTE  [name] RUE:[id number] DOB: 1920-10-28 DOA: 12/17/2011 PCP: Willey Blade, MD  Brief narrative:  78 year old African American female that was admitted 2 days ago from Dr. Diamantina Providence office with anemia for blood transfusion. While she was inpatient she developed a fever and was noted to have a bladder infection. Her blood pressure was also running low. She felt good on 12/1 and was discharged home with by mouth Levaquin to take and follow up with Dr. August Saucer this week. Patient given a dose of Levaquin prior to discharge. She stated that when she got home she got lightheaded and dizzy with some chest tightness and nausea vomiting once. She thinks that this happened secondary to taking all her medications on empty stomach. She normally takes her medication after she eats. EMS was called. Per family when EMS got there her blood pressure was in the 90s systolic and her pulse ox was 14% on room air. She was brought back to the emergency room. In the emergency room her blood pressure remained in the 90s systolic to low 100s without fluids heart rate 108 paced rhythm and pulse ox 96% on room air. She had a chest x-ray done that was normal,  cardiac markers were normal, lab work showed no significant change, EKG shows a paced rhythm.   Assessment/Plan:   Active Problems: Sepsis due to other specified Staphylococcus (12/18/2011) - Due UTI Started on empric zosyn on 11.30.2013, Staph coagulase negative UC 11.30.2013 sensitive to levaquin transition to on 12.3.2013. Will treat for 7 days if Blood cultures remained negative.  - afebrile.  Microcytic anemia (10/19/2011): - plan for EGD today  Diabetes mellitus (10/19/2011) - good control Acute on chronic renal failure (12/14/2011) - resolved with IV fluids.   - baseline cr. 1.6.  Hyponatremia (12/14/2011) - 2/2 to decrease intravascular volume.  - resolved with IV fluids.   Code Status: Full Code  Family Communication:  Daughter Alona Bene at bedside and updated  Disposition Plan: Home at time of discharge   Consultants:  GI  Palliative care Procedures:  EGD 12.5.2013 Antibiotics:  levaquin   Code Status: full code Family Communication: at bedside Disposition Plan: home when stable  Manson Passey, MD  [facility] Pager [phone]  If 7PM-7AM, please contact night-coverage [email or web address] Password [email or web address] 12/22/2011, 11:24 AM   LOS: 5 days    HPI/Subjective: No events overnight.  Objective: Filed Vitals:   12/21/11 1421 12/21/11 1700 12/21/11 2200 12/22/11 0600  BP: 134/71 125/72 137/78 140/62  Pulse: 84 76 93 99  Temp: 98.4 F (36.9 C) 98.4 F (36.9 C) 98.5 F (36.9 C) 98.7 F (37.1 C)  TempSrc: Oral Oral Oral Oral  Resp: 20 18 18 18   Height:      Weight:      SpO2: 94% 99% 93% 95%    Intake/Output Summary (Last 24 hours) at 12/22/11 1124 Last data filed at 12/21/11 1500  Gross per 24 hour  Intake    120 ml  Output      0 ml  Net    120 ml    Exam:   General:  Pt is alert, follows commands appropriately, not in acute distress  Cardiovascular: Regular rate and rhythm, S1/S2, no murmurs, no rubs, no gallops  Respiratory: Clear to auscultation bilaterally, no wheezing, no crackles, no rhonchi  Abdomen: Soft, non tender, non distended, bowel sounds present, no guarding  Extremities: No edema, pulses DP and PT palpable bilaterally  Neuro: Grossly nonfocal  Data Reviewed: Basic Metabolic  Panel:  Lab 12/19/11 0841 12/18/11 0505 12/17/11 1603 12/17/11 0420 12/16/11 0430  NA 137 133* 133* 130* 133*  K 3.8 4.0 4.1 4.0 4.2  CL 107 103 102 101 102  CO2 21 21 21 21 21   GLUCOSE 99 146* 165* 119* 127*  BUN 16 20 18 18 22   CREATININE 1.83* 2.13* 2.10* 2.03* 2.54*  CALCIUM 8.1* 8.1* 8.6 7.9* 8.1*  MG -- -- -- -- --  PHOS -- -- -- -- 2.9   Liver Function Tests:  Lab 12/17/11 1603 12/16/11 0430  AST 14 --  ALT <5 --  ALKPHOS 121* --  BILITOT 0.6 --  PROT 6.2 --  ALBUMIN 2.1* 1.8*    No results found for this basename: LIPASE:5,AMYLASE:5 in the last 168 hours No results found for this basename: AMMONIA:5 in the last 168 hours CBC:  Lab 12/21/11 1000 12/19/11 0841 12/17/11 1603 12/17/11 0420 12/16/11 0430  WBC 4.4 4.7 8.2 5.8 6.1  NEUTROABS -- 3.2 6.5 -- --  HGB 7.7* 7.5* 9.3* 8.2* 8.6*  HCT 23.5* 22.8* 28.8* 25.3* 26.9*  MCV 76.5* 77.6* 77.4* 76.9* 77.5*  PLT 178 169 219 202 195   Cardiac Enzymes:  Lab 12/18/11 2358 12/18/11 1815  CKTOTAL -- --  CKMB -- --  CKMBINDEX -- --  TROPONINI <0.30 <0.30   BNP: No components found with this basename: POCBNP:5 CBG:  Lab 12/22/11 0736 12/21/11 2214 12/21/11 1734 12/21/11 0741 12/20/11 2132  GLUCAP 103* 101* 98 115* 92    Recent Results (from the past 240 hour(s))  CULTURE, BLOOD (ROUTINE X 2)     Status: Normal   Collection Time   12/16/11 12:05 AM      Component Value Range Status Comment   Specimen Description BLOOD RIGHT HAND   Final    Special Requests BOTTLES DRAWN AEROBIC AND ANAEROBIC 10CC   Final    Culture  Setup Time 12/16/2011 03:04   Final    Culture NO GROWTH 5 DAYS   Final    Report Status 12/22/2011 FINAL   Final   CULTURE, BLOOD (ROUTINE X 2)     Status: Normal   Collection Time   12/16/11 12:12 AM      Component Value Range Status Comment   Specimen Description BLOOD RIGHT ARM   Final    Special Requests BOTTLES DRAWN AEROBIC AND ANAEROBIC 10C   Final    Culture  Setup Time 12/16/2011 03:04   Final    Culture NO GROWTH 5 DAYS   Final    Report Status 12/22/2011 FINAL   Final   URINE CULTURE     Status: Normal   Collection Time   12/16/11 11:06 AM      Component Value Range Status Comment   Specimen Description URINE, CLEAN CATCH   Final    Special Requests NONE   Final    Culture  Setup Time 12/16/2011 13:04   Final    Colony Count >=100,000 COLONIES/ML   Final    Culture     Final    Value: STAPHYLOCOCCUS SPECIES (COAGULASE NEGATIVE)     Note: RIFAMPIN AND GENTAMICIN SHOULD NOT  BE USED AS SINGLE DRUGS FOR TREATMENT OF STAPH INFECTIONS.   Report Status 12/18/2011 FINAL   Final    Organism ID, Bacteria STAPHYLOCOCCUS SPECIES (COAGULASE NEGATIVE)   Final   URINE CULTURE     Status: Normal   Collection Time   12/17/11 11:35 PM      Component Value Range  Status Comment   Specimen Description URINE, CATHETERIZED   Final    Special Requests NONE   Final    Culture  Setup Time 12/18/2011 09:23   Final    Colony Count NO GROWTH   Final    Culture NO GROWTH   Final    Report Status 12/19/2011 FINAL   Final      Studies: No results found.  Scheduled Meds:   . aspirin  81 mg Oral Daily  . atorvastatin  20 mg Oral q1800  . cilostazol  100 mg Oral BID  . clopidogrel  75 mg Oral Daily  . gabapentin  100 mg Oral BID  . insulin aspart  0-20 Units Subcutaneous TID WC  . insulin aspart  0-5 Units Subcutaneous QHS  . levofloxacin  250 mg Oral Daily  . levothyroxine  50 mcg Oral QAC breakfast  . loratadine  10 mg Oral Daily  . nystatin  500,000 Units Oral QID  . pantoprazole  40 mg Oral Daily  . Vitamin D (Ergocalciferol)  50,000 Units Oral Q7 days   Continuous Infusions:   . sodium chloride 50 mL/hr at 12/18/11 1640

## 2011-12-22 NOTE — Op Note (Signed)
[facility] [address] [place], [address]   ENDOSCOPY PROCEDURE REPORT  PATIENT: [name], [name]  MR#: [id number] BIRTHDATE: 1920-11-06 , 89  yrs. old GENDER: Female ENDOSCOPIST: Iva Boop, MD, [facility] PROCEDURE DATE:  12/22/2011 PROCEDURE:  EGD w/ biopsy ASA CLASS:     Class IV INDICATIONS:  Weight loss. MEDICATIONS: Fentanyl 25 mcg IV and Versed 2 mg IV TOPICAL ANESTHETIC: none  DESCRIPTION OF PROCEDURE: After the risks benefits and alternatives of the procedure were thoroughly explained, informed consent was obtained.  The Pentax Gastroscope [id number] endoscope was introduced through the mouth and advanced to the second portion of the duodenum. Without limitations.  The instrument was slowly withdrawn as the mucosa was fully examined.        STOMACH: A small clean-based ulcer, measuring 3 x 4mm in size, was found in the gastric body.  Biopsies were taken at edge of the ulcer and at the center of the ulcer.  ESOPHAGUS: A 2 cm hiatal hernia was noted.  The remainder of the upper endoscopy exam was otherwise normal. Retroflexed views revealed an ulcer.     The scope was then withdrawn from the patient and the procedure completed.  COMPLICATIONS: There were no complications. ENDOSCOPIC IMPRESSION: 1.   Small ulcer, measuring 3 x 4mm in size, was found in the gastric body 2.   2 cm hiatal hernia 3.   The remainder of the upper endoscopy exam was otherwise normal  RECOMMENDATIONS: 1.  Continue PPI 2.  Await biopsy results - doubt this ulcer is a significant problem and is unrelated to symptoms and problems most likely  REPEAT EXAM:  eSigned:  Iva Boop, MD, [facility] 12/22/2011 1:28 PM

## 2011-12-23 ENCOUNTER — Inpatient Hospital Stay (HOSPITAL_COMMUNITY): Payer: Medicare Other

## 2011-12-23 DIAGNOSIS — I2510 Atherosclerotic heart disease of native coronary artery without angina pectoris: Secondary | ICD-10-CM

## 2011-12-23 LAB — GLUCOSE, CAPILLARY: Glucose-Capillary: 120 mg/dL — ABNORMAL HIGH (ref 70–99)

## 2011-12-23 LAB — CBC
HCT: 22.6 % — ABNORMAL LOW (ref 36.0–46.0)
MCHC: 32.7 g/dL (ref 30.0–36.0)
MCV: 76.6 fL — ABNORMAL LOW (ref 78.0–100.0)
RDW: 17.9 % — ABNORMAL HIGH (ref 11.5–15.5)

## 2011-12-23 MED ORDER — MAGNESIUM CITRATE PO SOLN: 1.0000 | ORAL | Status: DC

## 2011-12-23 NOTE — Plan of Care (Signed)
Problem: Phase II Progression Outcomes Goal: Progress activity as tolerated unless otherwise ordered Outcome: Progressing Up to chair with 2 max assist

## 2011-12-23 NOTE — Progress Notes (Signed)
Patient up to chair with 2 max assist, patient did not stand straight up and was only able to move her feet towards the chair.

## 2011-12-23 NOTE — Progress Notes (Signed)
[facility] PROGRESS NOTE  [name] ZOX:[id number] DOB: 29-Aug-1920 DOA: 12/17/2011 PCP: Willey Blade, MD  Brief narrative: 78 year old African American female with past medical history of diabetes, diabetic neuropathy, hypertension who was sent from Dr. Diamantina [facility] office for anemia requiring blood transfusion. Hospital course was complicated with the development of sepsis secondary to UTI due to staph saprophyticus for which she is now receiving Levaquin. Furthermore, hemoglobin continues to drop but there is no obvious source of bleed. Patient did have EGD 12/22/2011 with findings of small ulcer and hiatal hernia none of which could really explain the cause of anemia.  Assessment/Plan:   Principal Problem Sepsis due to Staph Saprophyticus  Patient was initially on zosyn but once urine culture results were available and showed staph coag neg species (likely saprophyticus as the only one in that group) patient was transitioned to Levaquin based on urine culture sensitivities  Levaquin was started 12/19/2011 and will be continued for total of 7 days  Blood cultures to date are all negative  Continue normal saline @ 50 cc/hr  Active Problems Anemia of chronic disease  Based on EGD, small ulcer and hiatal hernia  No obvious source of bleed  Would consider holding aspirin and plavix if hemoglobin continues to drop; I am not even sure if aspirin and plavix are really indicated for this 78 year old female  Continue Protonix 40 mg daily PO  We will follow up with GI if further diagnostic studies are warranted  Hemoglobin 7.4 today (down form yesterday 7.7  Follow up CBC in am Diabetes mellitus   We will continue current insulin regimen with insulin 3 units TID and sliding scale Acute on chronic kidney disease, stage 3   Baseline creatinine 1.6  Slowly resolving with IV fluids, today 1.8 Hyponatremia   Secondary to dehydration  Follow up BMP in am  Consultants:  GI   Palliative care Physical therapy Procedures:  EGD 12.5.2013 Antibiotics:  levaquin  Code Status: full code  Family Communication: at bedside  Disposition Plan: home when stable; needs PT evaluation  Manson Passey, MD  [facility]  Pager [phone]  If 7PM-7AM, please contact night-coverage [email or web address] Password [email or web address] 12/23/2011, 7:59 AM   LOS: 6 days   HPI/Subjective: No acute events overnight.  Objective: Filed Vitals:   12/22/11 1345 12/22/11 1447 12/23/11 0500 12/23/11 0621  BP: 148/83 131/66  131/60  Pulse:  100    Temp:  98.5 F (36.9 C)  98.7 F (37.1 C)  TempSrc:  Oral  Oral  Resp: 22 18  20   Height:      Weight:   89.1 kg (196 lb 6.9 oz)   SpO2: 98% 97%  98%    Intake/Output Summary (Last 24 hours) at 12/23/11 0759 Last data filed at 12/23/11 0630  Gross per 24 hour  Intake    635 ml  Output    945 ml  Net   -310 ml    Exam:   General:  Pt is alert, not in acute distress  Cardiovascular: Regular rate and rhythm, S1/S2, no murmurs, no rubs, no gallops  Respiratory: Clear to auscultation bilaterally, no wheezing, no crackles, no rhonchi  Abdomen: Soft, non tender, non distended, bowel sounds present, no guarding  Extremities: No edema, pulses DP and PT palpable bilaterally  Neuro: Grossly nonfocal  Data Reviewed: Basic Metabolic Panel:  Lab 12/19/11 8295 12/18/11 0505 12/17/11 1603 12/17/11 0420  NA 137 133* 133* 130*  K 3.8 4.0 4.1 4.0  CL 107 103  102 101  CO2 21 21 21 21   GLUCOSE 99 146* 165* 119*  BUN 16 20 18 18   CREATININE 1.83* 2.13* 2.10* 2.03*  CALCIUM 8.1* 8.1* 8.6 7.9*  MG -- -- -- --  PHOS -- -- -- --   Liver Function Tests:  Lab 12/17/11 1603  AST 14  ALT <5  ALKPHOS 121*  BILITOT 0.6  PROT 6.2  ALBUMIN 2.1*   CBC:  Lab 12/21/11 1000 12/19/11 0841 12/17/11 1603 12/17/11 0420  WBC 4.4 4.7 8.2 5.8  NEUTROABS -- 3.2 6.5 --  HGB 7.7* 7.5* 9.3* 8.2*  HCT 23.5* 22.8* 28.8* 25.3*  MCV 76.5* 77.6* 77.4* 76.9*  PLT 178 169  219 202   Cardiac Enzymes:  Lab 12/18/11 2358 12/18/11 1815  CKTOTAL -- --  CKMB -- --  CKMBINDEX -- --  TROPONINI <0.30 <0.30   CBG:  Lab 12/22/11 2133 12/22/11 1834 12/22/11 1442 12/22/11 1154 12/22/11 0736  GLUCAP 103* 99 104* 92 103*    Recent Results (from the past 240 hour(s))  CULTURE, BLOOD (ROUTINE X 2)     Status: Normal   Collection Time   12/16/11 12:05 AM      Component Value Range Status Comment   Specimen Description BLOOD RIGHT HAND   Final    Special Requests BOTTLES DRAWN AEROBIC AND ANAEROBIC 10CC   Final    Culture  Setup Time 12/16/2011 03:04   Final    Culture NO GROWTH 5 DAYS   Final    Report Status 12/22/2011 FINAL   Final   CULTURE, BLOOD (ROUTINE X 2)     Status: Normal   Collection Time   12/16/11 12:12 AM      Component Value Range Status Comment   Specimen Description BLOOD RIGHT ARM   Final    Special Requests BOTTLES DRAWN AEROBIC AND ANAEROBIC 10C   Final    Culture  Setup Time 12/16/2011 03:04   Final    Culture NO GROWTH 5 DAYS   Final    Report Status 12/22/2011 FINAL   Final   URINE CULTURE     Status: Normal   Collection Time   12/16/11 11:06 AM      Component Value Range Status Comment   Specimen Description URINE, CLEAN CATCH   Final    Special Requests NONE   Final    Culture  Setup Time 12/16/2011 13:04   Final    Colony Count >=100,000 COLONIES/ML   Final    Culture     Final    Value: STAPHYLOCOCCUS SPECIES (COAGULASE NEGATIVE)     Note: RIFAMPIN AND GENTAMICIN SHOULD NOT BE USED AS SINGLE DRUGS FOR TREATMENT OF STAPH INFECTIONS.   Report Status 12/18/2011 FINAL   Final    Organism ID, Bacteria STAPHYLOCOCCUS SPECIES (COAGULASE NEGATIVE)   Final   URINE CULTURE     Status: Normal   Collection Time   12/17/11 11:35 PM      Component Value Range Status Comment   Specimen Description URINE, CATHETERIZED   Final    Special Requests NONE   Final    Culture  Setup Time 12/18/2011 09:23   Final    Colony Count NO GROWTH    Final    Culture NO GROWTH   Final    Report Status 12/19/2011 FINAL   Final      Studies: No results found.  Scheduled Meds:   . aspirin  81 mg Oral Daily  . atorvastatin  20  mg Oral q1800  . cilostazol  100 mg Oral BID  . clopidogrel  75 mg Oral Daily  . gabapentin  100 mg Oral BID  . insulin aspart  0-20 Units Subcutaneous TID WC  . insulin aspart  0-5 Units Subcutaneous QHS  . levofloxacin  250 mg Oral Daily  . levothyroxine  50 mcg Oral QAC breakfast  . loratadine  10 mg Oral Daily  . nystatin  500,000 Units Oral QID  . pantoprazole  40 mg Oral Daily  . Vitamin D (Ergocalciferol)  50,000 Units Oral Q7 days

## 2011-12-23 NOTE — Progress Notes (Signed)
Patient c/o feeling short winded, O2 sats 96-100% on 2 liters.  Did encourage patient to get out of bed to expand her lungs.  Got patient up with hoyer lift due to difficulty standing earlier in the day.  Patient states her belly feels tight and asks that we give her some Mag Citrate which she normally takes for her bowels every 4-5 days, text sent to MD.

## 2011-12-23 NOTE — Progress Notes (Signed)
Patient ready to get back into bed.  Patient again up with 2 max assist, moved feet less this time and 2 RNs basically had to lift patient back to bed.  Awaiting PT eval.

## 2011-12-24 ENCOUNTER — Inpatient Hospital Stay (HOSPITAL_COMMUNITY): Payer: Medicare Other

## 2011-12-24 LAB — CBC
MCH: 25.2 pg — ABNORMAL LOW (ref 26.0–34.0)
MCV: 76.9 fL — ABNORMAL LOW (ref 78.0–100.0)
Platelets: 196 10*3/uL (ref 150–400)
RDW: 18.2 % — ABNORMAL HIGH (ref 11.5–15.5)

## 2011-12-24 LAB — BASIC METABOLIC PANEL
CO2: 21 mEq/L (ref 19–32)
Calcium: 7.9 mg/dL — ABNORMAL LOW (ref 8.4–10.5)
Creatinine, Ser: 0.94 mg/dL (ref 0.50–1.10)
GFR calc Af Amer: 61 mL/min — ABNORMAL LOW (ref >90)

## 2011-12-24 LAB — CLOSTRIDIUM DIFFICILE BY PCR: Toxigenic C. Difficile by PCR: NEGATIVE

## 2011-12-24 LAB — GLUCOSE, CAPILLARY: Glucose-Capillary: 152 mg/dL — ABNORMAL HIGH (ref 70–99)

## 2011-12-24 NOTE — Progress Notes (Signed)
PT in to evaluate patient, see note. Nursing staff unable to ambulate patient due to inability of patient even with 2 max assist.  Upon talking to patient it does not appear that she has walked for a number of years and mainly transfers from bed to wheelchair and then wheelchair to commode.  Per patient she spends the majority of her day at home in the bed.

## 2011-12-24 NOTE — Progress Notes (Signed)
[facility] PROGRESS NOTE  [name] QMV:[id number] DOB: Jun 04, 1920 DOA: 12/17/2011 PCP: Willey Blade, MD  Brief narrative: 78 year old African American female with past medical history of diabetes, diabetic neuropathy, hypertension who was sent from Dr. Diamantina [place] office for anemia requiring blood transfusion. Hospital course was complicated with the development of sepsis secondary to UTI due to staph saprophyticus for which she is now receiving Levaquin. Furthermore, hemoglobin continues to drop but there is no obvious source of bleed. Patient did have EGD 12/22/2011 with findings of small ulcer and hiatal hernia none of which could really explain the cause of anemia. Hemoglobin remains stable over past few measurements.  Assessment/Plan:   Principal Problem Sepsis due to Staph Saprophyticus  Patient was initially on zosyn but once urine culture results were available and showed staph coag neg species (likely saprophyticus as the only one in that group) patient was transitioned to Levaquin based on urine culture sensitivities  Levaquin was started 12/19/2011 and will be continued for total of 7 days Blood cultures to date are all negative  Continue normal saline @ 50 cc/hr  Active Problems  Anemia of chronic disease  Based on EGD, small ulcer and hiatal hernia  No obvious source of bleed  Would consider holding aspirin and plavix if hemoglobin continues to drop; I am not even sure if aspirin and plavix are really indicated for this 78 year old female  Continue Protonix 40 mg daily PO  We will follow up with GI if further diagnostic studies are warranted  Hemoglobin 7.4 today; remains stable for past few days and no signs of active bleed Diabetes mellitus  We will continue current insulin regimen with insulin 3 units TID and sliding scale Acute on chronic kidney disease, stage 3  Baseline creatinine 1.6 Creatinine now within normal limits Hyponatremia  Secondary to dehydration   Resolved with IV fluids Protein calorie malnutrition  SLP evaluation and nutrition consult  Consultants:  GI  Palliative care  Physical therapy Procedures:  EGD 12.5.2013 Antibiotics:  Levaquin 12/3 2013 --> for total of 7 days (end day 12/25/2011  Code Status: full code  Family Communication: updated the patient's daughter and niece daily Disposition Plan: home when stable; needs PT evaluation   Manson Passey, MD  [facility]  Pager [phone]   If 7PM-7AM, please contact night-coverage [email or web address] Password [facility] 12/24/2011, 10:31 AM   LOS: 7 days   HPI/Subjective: No acute overnight events.  Objective: Filed Vitals:   12/23/11 0621 12/23/11 1329 12/23/11 2200 12/24/11 0534  BP: 131/60 118/60 124/75 101/56  Pulse:   99 107  Temp: 98.7 F (37.1 C) 98.7 F (37.1 C) 98.2 F (36.8 C) 98.9 F (37.2 C)  TempSrc: Oral Oral Oral Oral  Resp: 20 20 20 18   Height:      Weight:    90.4 kg (199 lb 4.7 oz)  SpO2: 98% 90% 98% 97%    Intake/Output Summary (Last 24 hours) at 12/24/11 1031 Last data filed at 12/24/11 0600  Gross per 24 hour  Intake   1175 ml  Output      0 ml  Net   1175 ml    Exam:   General:  Pt is alert and not in acute distress  Cardiovascular: Regular rate and rhythm, S1/S2, no murmurs, no rubs, no gallops  Respiratory: Clear to auscultation bilaterally, no wheezing, no crackles, no rhonchi  Abdomen: Soft, non tender, non distended, bowel sounds present, no guarding  Extremities: No edema, pulses DP and PT palpable  bilaterally  Neuro: Grossly nonfocal  Data Reviewed: Basic Metabolic Panel:  Lab 12/24/11 1610 12/19/11 0841 12/18/11 0505 12/17/11 1603  NA 135 137 133* 133*  K 3.3* 3.8 4.0 4.1  CL 106 107 103 102  CO2 21 21 21 21   GLUCOSE 141* 99 146* 165*  BUN 5* 16 20 18   CREATININE 0.94 1.83* 2.13* 2.10*  CALCIUM 7.9* 8.1* 8.1* 8.6  MG -- -- -- --  PHOS -- -- -- --   Liver Function Tests:  Lab 12/17/11 1603  AST 14  ALT <5   ALKPHOS 121*  BILITOT 0.6  PROT 6.2  ALBUMIN 2.1*   CBC:  Lab 12/24/11 0602 12/23/11 0905 12/21/11 1000 12/19/11 0841 12/17/11 1603  WBC 5.6 5.2 4.4 4.7 8.2  NEUTROABS -- -- -- 3.2 6.5  HGB 7.4* 7.4* 7.7* 7.5* 9.3*  HCT 22.6* 22.6* 23.5* 22.8* 28.8*  MCV 76.9* 76.6* 76.5* 77.6* 77.4*  PLT 196 180 178 169 219   Cardiac Enzymes:  Lab 12/18/11 2358 12/18/11 1815  CKTOTAL -- --  CKMB -- --  CKMBINDEX -- --  TROPONINI <0.30 <0.30   BNP: No components found with this basename: POCBNP:5 CBG:  Lab 12/24/11 0753 12/23/11 2152 12/23/11 1659 12/23/11 1240 12/23/11 0748  GLUCAP 133* 96 120* 130* 98    Recent Results (from the past 240 hour(s))  CULTURE, BLOOD (ROUTINE X 2)     Status: Normal   Collection Time   12/16/11 12:05 AM      Component Value Range Status Comment   Specimen Description BLOOD RIGHT HAND   Final    Special Requests BOTTLES DRAWN AEROBIC AND ANAEROBIC 10CC   Final    Culture  Setup Time 12/16/2011 03:04   Final    Culture NO GROWTH 5 DAYS   Final    Report Status 12/22/2011 FINAL   Final   CULTURE, BLOOD (ROUTINE X 2)     Status: Normal   Collection Time   12/16/11 12:12 AM      Component Value Range Status Comment   Specimen Description BLOOD RIGHT ARM   Final    Special Requests BOTTLES DRAWN AEROBIC AND ANAEROBIC 10C   Final    Culture  Setup Time 12/16/2011 03:04   Final    Culture NO GROWTH 5 DAYS   Final    Report Status 12/22/2011 FINAL   Final   URINE CULTURE     Status: Normal   Collection Time   12/16/11 11:06 AM      Component Value Range Status Comment   Specimen Description URINE, CLEAN CATCH   Final    Special Requests NONE   Final    Culture  Setup Time 12/16/2011 13:04   Final    Colony Count >=100,000 COLONIES/ML   Final    Culture     Final    Value: STAPHYLOCOCCUS SPECIES (COAGULASE NEGATIVE)     Note: RIFAMPIN AND GENTAMICIN SHOULD NOT BE USED AS SINGLE DRUGS FOR TREATMENT OF STAPH INFECTIONS.   Report Status 12/18/2011 FINAL    Final    Organism ID, Bacteria STAPHYLOCOCCUS SPECIES (COAGULASE NEGATIVE)   Final   URINE CULTURE     Status: Normal   Collection Time   12/17/11 11:35 PM      Component Value Range Status Comment   Specimen Description URINE, CATHETERIZED   Final    Special Requests NONE   Final    Culture  Setup Time 12/18/2011 09:23   Final  Colony Count NO GROWTH   Final    Culture NO GROWTH   Final    Report Status 12/19/2011 FINAL   Final      Studies: Dg Chest Port 1 View  12/24/2011  *RADIOLOGY REPORT*  Clinical Data: Pain with deep inspiration  PORTABLE CHEST - 1 VIEW  Comparison: 12/23/2011  Findings: Cardiomegaly again noted.  Status post CABG.  Dual lead cardiac pacemaker is unchanged in position.  Persistent mild interstitial edema bilaterally.  Stable bilateral basilar atelectasis or infiltrate.  IMPRESSION: Dual lead cardiac pacemaker is unchanged in position.  Persistent mild interstitial edema bilaterally.  Stable bilateral basilar atelectasis or infiltrate.   Original Report Authenticated By: Natasha Mead, M.D.    Dg Chest Port 1 View  12/23/2011  *RADIOLOGY REPORT*  Clinical Data: Shortness of breath.  PORTABLE CHEST - 1 VIEW  Comparison: Chest 12/17/2011.  Findings: There is new pulmonary edema.  Small left effusion and basilar atelectasis are also noted.  Heart size is upper normal. The patient is status post CABG with a pacing device in place.  IMPRESSION: New pulmonary edema and small left effusion.   Original Report Authenticated By: Holley Dexter, M.D.     Scheduled Meds:   . aspirin  81 mg Oral Daily  . atorvastatin  20 mg Oral q1800  . cilostazol  100 mg Oral BID  . clopidogrel  75 mg Oral Daily  . gabapentin  100 mg Oral BID  . insulin aspart  0-20 Units Subcutaneous TID WC  . insulin aspart  0-5 Units Subcutaneous QHS  . levofloxacin  250 mg Oral Daily  . levothyroxine  50 mcg Oral QAC breakfast  . loratadine  10 mg Oral Daily  . magnesium citrate  1 Bottle Oral  Weekly  . nystatin  500,000 Units Oral QID  . pantoprazole  40 mg Oral Daily  . Vitamin D (Ergocalciferol)  50,000 Units Oral Q7 days  . [DISCONTINUED] magnesium citrate  1 Bottle Oral Weekly   Continuous Infusions:   . sodium chloride 50 mL/hr at 12/23/11 1759

## 2011-12-24 NOTE — Evaluation (Signed)
Physical Therapy Evaluation Patient Details Name: [name] MRN: [id number] DOB: [birth year] Today's Date: 12/24/2011 Time: 5284-1324 PT Time Calculation (min): 37 min  PT Assessment / Plan / Recommendation Clinical Impression  Pt presents with sepsis from UTI with staph infection.  She also has history of DM, neuropathy, HTN and is legally blind.  Tolerated bed mobility and sitting EOB with +2 assist.  Attempted standing x 2 reps and she was able to stand with +2, however unable to weight shift for LE advancement.  Pt will benefit from skilled PT in acute venue to address deficits.  PT recommends SNF for follow up at D/C to maximize pts safety.  If pt does return home, she will need a mechanical lift.      PT Assessment  Patient needs continued PT services    Follow Up Recommendations  Home health PT;SNF;Supervision/Assistance - 24 hour    Does the patient have the potential to tolerate intense rehabilitation      Barriers to Discharge None      Equipment Recommendations   (mechanical lift. )    Recommendations for Other Services     Frequency Min 3X/week    Precautions / Restrictions Precautions Precautions: Fall Precaution Comments: pt is legally blind, can see somewhat out of left eye Restrictions Weight Bearing Restrictions: No   Pertinent Vitals/Pain No pain, some c/o SOB      Mobility  Bed Mobility Bed Mobility: Rolling Right;Rolling Left;Left Sidelying to Sit;Sit to Supine Rolling Right: 1: +2 Total assist Rolling Right: Patient Percentage: 20% Rolling Left: 1: +2 Total assist Rolling Left: Patient Percentage: 20% Left Sidelying to Sit: 1: +2 Total assist Left Sidelying to Sit: Patient Percentage: 10% Sit to Supine: 1: +2 Total assist Sit to Supine: Patient Percentage: 0% Details for Bed Mobility Assistance: Requires assist for UE placement on rail, LEs and trunk for rolling and getting into sitting position.  Performed rolling several times in order  to assist with self care due to fecal incontinence.  Provided max verbal and manual cuing for hand placement and technique, however pt unable to assist more than 10-20%.   Transfers Transfers: Sit to Stand;Stand to Sit Sit to Stand: 1: +2 Total assist;From elevated surface;With upper extremity assist;From bed Sit to Stand: Patient Percentage: 20% Stand to Sit: 1: +2 Total assist;With upper extremity assist;To bed Stand to Sit: Patient Percentage: 20% Details for Transfer Assistance: Attempted sit to stand x 2 reps with 2 person HHA in order for pt to stand with max cues for quad and glute activation for upright stance.  Pt able to attain upright stance with +2 assist, however unable to weight shfit for LE advancement.  Ambulation/Gait Ambulation/Gait Assistance: Not tested (comment) Stairs: No Wheelchair Mobility Wheelchair Mobility: No    Shoulder Instructions     Exercises     PT Diagnosis: Difficulty walking;Generalized weakness  PT Problem List: Decreased strength;Decreased activity tolerance;Decreased balance;Decreased mobility;Decreased safety awareness;Decreased knowledge of precautions;Obesity;Impaired sensation;Decreased knowledge of use of DME PT Treatment Interventions: DME instruction;Functional mobility training;Therapeutic activities;Therapeutic exercise;Balance training;Patient/family education   PT Goals Acute Rehab PT Goals PT Goal Formulation: With patient/family Time For Goal Achievement: 01/07/12 Potential to Achieve Goals: Fair Pt will Roll Supine to Right Side: with min assist PT Goal: Rolling Supine to Right Side - Progress: Goal set today Pt will Roll Supine to Left Side: with min assist PT Goal: Rolling Supine to Left Side - Progress: Goal set today Pt will go Supine/Side to Sit: with mod assist  PT Goal: Supine/Side to Sit - Progress: Goal set today Pt will go Sit to Supine/Side: with mod assist PT Goal: Sit to Supine/Side - Progress: Goal set today Pt will  go Sit to Stand: with +2 total assist (pt assist 50%) PT Goal: Sit to Stand - Progress: Goal set today Pt will go Stand to Sit: with +2 total assist (Pt assist 60%) PT Goal: Stand to Sit - Progress: Goal set today Pt will Transfer Bed to Chair/Chair to Bed: with +2 total assist (Pt assist 40%) PT Transfer Goal: Bed to Chair/Chair to Bed - Progress: Goal set today  Visit Information  Last PT Received On: 12/24/11 Assistance Needed: +2    Subjective Data  Subjective: I will try Patient Stated Goal: n/a   Prior Functioning  Home Living Lives With: Family Available Help at Discharge: Family;Available 24 hours/day Type of Home: House Home Access: Ramped entrance Home Layout: One level Home Adaptive Equipment: Wheelchair - manual Additional Comments: Per daughter pt has been using w/c mostly for past 3 months, prior to then she was ambulating to/from bed and bathroom with bar along the wall.  she has refused to use RW.   Prior Function Level of Independence: Needs assistance Needs Assistance: Bathing;Dressing;Grooming;Toileting;Meal Prep;Gait;Transfers Able to Take Stairs?: No Driving: No Vocation: Retired Musician: No difficulties    Cognition  Overall Cognitive Status: Appears within functional limits for tasks assessed/performed Arousal/Alertness: Awake/alert Orientation Level: Appears intact for tasks assessed Behavior During Session: [facility] for tasks performed    Extremity/Trunk Assessment Right Lower Extremity Assessment RLE ROM/Strength/Tone: Deficits RLE ROM/Strength/Tone Deficits: Grossly 2+ to 3/5 per functional assessment Left Lower Extremity Assessment LLE ROM/Strength/Tone: Deficits LLE ROM/Strength/Tone Deficits: Grossly 2+ to 3/5 per functional assessment   Balance Balance Balance Assessed: Yes Static Sitting Balance Static Sitting - Balance Support: Bilateral upper extremity supported;Feet supported Static Sitting - Level of Assistance: 3:  Mod assist;4: Min assist Static Sitting - Comment/# of Minutes: Pt able to sit on EOB with min/mod support due to intermittent posterior leaning.   End of Session PT - End of Session Equipment Utilized During Treatment: Gait belt Activity Tolerance: Patient limited by fatigue Patient left: in bed;with call bell/phone within reach;with family/visitor present Nurse Communication: Mobility status;Need for lift equipment  GP     Page, Meribeth Mattes 12/24/2011, 5:07 PM

## 2011-12-24 NOTE — Progress Notes (Signed)
Patient stated she experienced pain with inspiration.  Notified Claiborne Billings NP.  Vitals stable. Patient resting comfortably.  Chest xray ordered.  Will continue to monitor.  Barrie Lyme 12/24/2011 1610

## 2011-12-24 NOTE — Plan of Care (Signed)
Problem: Phase III Progression Outcomes Goal: Pain controlled on oral analgesia Outcome: Not Applicable Date Met:  12/24/11 Denies pain

## 2011-12-25 ENCOUNTER — Encounter (HOSPITAL_COMMUNITY): Payer: Self-pay | Admitting: Internal Medicine

## 2011-12-25 DIAGNOSIS — M79609 Pain in unspecified limb: Secondary | ICD-10-CM

## 2011-12-25 LAB — GLUCOSE, CAPILLARY
Glucose-Capillary: 118 mg/dL — ABNORMAL HIGH (ref 70–99)
Glucose-Capillary: 113 mg/dL — ABNORMAL HIGH (ref 70–99)
Glucose-Capillary: 116 mg/dL — ABNORMAL HIGH (ref 70–99)

## 2011-12-25 LAB — CBC
HCT: 22.7 % — ABNORMAL LOW (ref 36.0–46.0)
Hemoglobin: 7.5 g/dL — ABNORMAL LOW (ref 12.0–15.0)
MCH: 25.4 pg — ABNORMAL LOW (ref 26.0–34.0)
RBC: 2.95 MIL/uL — ABNORMAL LOW (ref 3.87–5.11)

## 2011-12-25 MED ORDER — ENSURE COMPLETE PO LIQD
237.0000 mL | Freq: Two times a day (BID) | ORAL | Status: DC
  Administered 2011-12-25 – 2011-12-26 (×3): 237 mL via ORAL

## 2011-12-25 MED ORDER — PANTOPRAZOLE SODIUM 40 MG PO TBEC
40.0000 mg | DELAYED_RELEASE_TABLET | Freq: Two times a day (BID) | ORAL | Status: DC
  Administered 2011-12-25 – 2011-12-26 (×3): 40 mg via ORAL
  Filled 2011-12-25 (×6): qty 1

## 2011-12-25 NOTE — Progress Notes (Signed)
Nutrition Follow-up  Intervention:  - D/C Resource Breeze. Ensure Complete BID.  - Assisted pt with ordering snacks. - Recommend MD discuss enteral nutrition as pt continues to eat inadequate amounts of food and has lost 8 pounds since admission.  - Will continue to monitor.   Diet Order: CHO modified   - Noted discussion from palliative care meeting on 12/5 with pt desiring full scope of medical treatment. EGD on 12/6 showed small clean-based ulcer in gastric body. Pt reports continued on/off nausea however states the last time she had vomiting was 1 week ago. Pt and family report pt has been eating somewhat better, noted intake has varied from 25-100% meal intake. Pt's weight down 8 pounds since admission.    Meds: Scheduled Meds:   . aspirin  81 mg Oral Daily  . atorvastatin  20 mg Oral q1800  . cilostazol  100 mg Oral BID  . clopidogrel  75 mg Oral Daily  . gabapentin  100 mg Oral BID  . insulin aspart  0-20 Units Subcutaneous TID WC  . insulin aspart  0-5 Units Subcutaneous QHS  . levothyroxine  50 mcg Oral QAC breakfast  . loratadine  10 mg Oral Daily  . magnesium citrate  1 Bottle Oral Weekly  . nystatin  500,000 Units Oral QID  . pantoprazole  40 mg Oral BID AC  . Vitamin D (Ergocalciferol)  50,000 Units Oral Q7 days  . [DISCONTINUED] levofloxacin  250 mg Oral Daily  . [DISCONTINUED] pantoprazole  40 mg Oral Daily   Continuous Infusions:   . sodium chloride 50 mL/hr at 12/25/11 1031   PRN Meds:.acetaminophen, acetaminophen, alum & mag hydroxide-simeth, baclofen, feeding supplement, ondansetron (ZOFRAN) IV, ondansetron, zolpidem   CMP     Component Value Date/Time   NA 135 12/24/2011 0602   K 3.3* 12/24/2011 0602   CL 106 12/24/2011 0602   CO2 21 12/24/2011 0602   GLUCOSE 141* 12/24/2011 0602   BUN 5* 12/24/2011 0602   CREATININE 0.94 12/24/2011 0602   CALCIUM 7.9* 12/24/2011 0602   PROT 6.2 12/17/2011 1603   ALBUMIN 2.1* 12/17/2011 1603   AST 14 12/17/2011 1603   ALT  <5 12/17/2011 1603   ALKPHOS 121* 12/17/2011 1603   BILITOT 0.6 12/17/2011 1603   GFRNONAA 52* 12/24/2011 0602   GFRAA 61* 12/24/2011 0602    CBG (last 3)   Basename 12/25/11 0733 12/24/11 2109 12/24/11 1644  GLUCAP 118* 140* 135*     Intake/Output Summary (Last 24 hours) at 12/25/11 1401 Last data filed at 12/25/11 0719  Gross per 24 hour  Intake 865.83 ml  Output      0 ml  Net 865.83 ml   Last BM - 12/8  Weight Status:   12/2 212 lb 1.3 oz 12/9 204 lb   Estimated needs:   1650-1850 calories 65-85g protein  Nutrition Dx: Inadequate oral intake - improved only slightly  Goal:  1. Resolution of nausea/vomiting - vomiting resolved, pt with ongoing nausea 2. Nutrition per goals of care - met   New goal: Pt to consume >50% of meals consistently.   Monitor: Weights, labs, intake, discussion of nutrition support   Levon Hedger MS, RD, LDN [phone] Pager [phone] After Hours Pager

## 2011-12-25 NOTE — Progress Notes (Signed)
Met with pt and daughter [name] at bedside to discuss d/cplanning. Plan is for home with [facility] services. They are requesting HHPT/OT/RN and aide. They already have all the equipment they need i.e hospital bed, rolling walker and 3-in 1. They are requesting services be rendered by [facility] and referral has been made to [facility] the liaison.

## 2011-12-25 NOTE — Progress Notes (Signed)
[name]      DOB: 06/27/1920      JWJ:[id number]   Palliative Medicine Team at [facility] Progress Note    Subjective:Patient comfortable, c/o generalized weakness, but states gastric indigestion seems to have improved since last week. EGD performed 12/6 revealed small ulceration. Hematologically stable at present. Daughter Alona Bene at bedside.     Filed Vitals:   12/25/11 1353  BP: 114/68  Pulse: 98  Temp: 99.1 F (37.3 C)  Resp: 18   Physical exam: General: Alert/oriented, pleasant and thankful HEENT: conjunctivae clear, buccal mucosa moist CHEST: CTA bilaterally CVS: RRR ABD: soft, non-tender, BS audible EXT: no pedal edema  Assessment and plan: Patient is a pleasant, oriented 78 yo AAF, with PMH CAD,CKD, DM recently discharged s/p blood transfusion for anemia and treatment for UTI. Readmitted 12/17/11, after taking Levaquin dose orally and became dizzy and lightheaded, accompanied by chest tightness, nausea and small emesis. CXR, EKG and cardiac enzyme work up was normal. Patient admits to 30 lb weight loss over past year.  1) Pain: Tylenol as needed 2) Indigestion: improved with PPI dose increase 3) Disposition: Family requesting patient to return home with home health services. Contacted case Manager, Algernon Huxley to speak with daughter.  Time In Time Out Total Time Spent with Patient Total Overall Time  3:50p 4:15p 20 min 25 min   [name], CNS-C Palliative Medicine Team [facility] Team Phone: [phone] Pager: [phone]

## 2011-12-25 NOTE — Progress Notes (Signed)
[facility] PROGRESS NOTE  [name] BJY:[id number] DOB: [birth year] DOA: 12/17/2011 PCP: Willey Blade, MD  Brief narrative: 78 year old African American female with past medical history of diabetes, diabetic neuropathy, hypertension who was sent from Dr. Diamantina [place] office for anemia requiring blood transfusion. Hospital course was complicated with the development of sepsis secondary to UTI due to staph saprophyticus for which she is now receiving Levaquin. Furthermore, hemoglobin continues to drop but there is no obvious source of bleed. Patient did have EGD 12/22/2011 with findings of small ulcer and hiatal hernia none of which could really explain the cause of anemia. Hemoglobin remains stable over past few measurements.  Assessment/Plan:   Principal Problem coagulase negative Staph urinary tract infection with sepsis Patient was initially on zosyn but once urine culture results were available and showed staph coag neg species (likely saprophyticus as the only one in that group) patient was transitioned to Levaquin based on urine culture sensitivities  Levaquin was started 12/19/2011 and will be continued for total of 7 days, DC after seventh day of antibiotics today. Blood cultures to date are all negative  Continue normal saline @ 50 cc/hr  Active Problems  Anemia of chronic disease  Based on EGD, small ulcer and hiatal hernia  No obvious source of bleed  Would consider holding aspirin and plavix if hemoglobin continues to drop; I am not even sure if aspirin and plavix are really indicated for this 78 year old female  Increase Protonix 40 mg 2 twice a day PO  We will follow up with GI if further diagnostic studies are warranted  Hemoglobin 7.5 today has stable for past few days and no signs of active bleed, follow and recheck in a.m., if hemoglobin continues to remain stable will plan discharge to home with home health services Diabetes mellitus  We will continue current insulin  regimen with insulin 3 units TID and sliding scale Acute on chronic kidney disease, stage 3  Baseline creatinine 1.6 Creatinine now within normal limits Hyponatremia  Secondary to dehydration  Resolved with IV fluids Protein calorie malnutrition   awaiting nutrition consult, patient denies any dysphagia and per nursing staff she has been swallowinh without difficulty. Hypokalemia  -Recheck potassium Consultants:  GI  Palliative care  Physical therapy Procedures:  EGD 12.5.2013 Antibiotics:  Levaquin 12/3 2013 --> for total of 7 days (end day 12/25/2011)   -  Code Status: full code  Family Communication: updated the patient's 2 daughters   Disposition Plan: home with PT services, possibly in am   Donnalee Curry [facility]  Pager [phone]   If 7PM-7AM, please contact night-coverage [email or web address] Password [email or web address] 12/25/2011, 11:19 AM   LOS: 8 days   HPI/Subjective: Complaints of increase heartburn, and belching a lot. Denies shortness of breath. Daughter at bedside and they state patient does not want to go to nursing home facility, and per daughter and they are in their 55 /7 with patient. Patient denies any difficulty swallowing.   Objective: Filed Vitals:   12/24/11 0534 12/24/11 1328 12/24/11 2200 12/25/11 0600  BP: 101/56 106/62 122/60 112/54  Pulse: 107 106 103 105  Temp: 98.9 F (37.2 C) 98.7 F (37.1 C) 98.5 F (36.9 C) 98.8 F (37.1 C)  TempSrc: Oral Oral Oral Oral  Resp: 18 18 18 20   Height:      Weight: 90.4 kg (199 lb 4.7 oz)   92.534 kg (204 lb)  SpO2: 97% 100% 100% 100%    Intake/Output Summary (Last 24 hours) at 12/25/11  1119 Last data filed at 12/25/11 0719  Gross per 24 hour  Intake 1465.83 ml  Output      0 ml  Net 1465.83 ml    Exam:   General:  Pt is alert and not in acute distress  Cardiovascular: Regular rate and rhythm, S1/S2, no murmurs, no rubs, no gallops  Respiratory: Clear to auscultation bilaterally, no wheezing, no crackles, no  rhonchi  Abdomen: Soft, epigastric tenderness, non distended, bowel sounds present, no guarding  Extremities: No edema, pulses DP and PT palpable bilaterally  Neuro: Grossly nonfocal  Data Reviewed: Basic Metabolic Panel:  Lab 12/24/11 1610 12/19/11 0841  NA 135 137  K 3.3* 3.8  CL 106 107  CO2 21 21  GLUCOSE 141* 99  BUN 5* 16  CREATININE 0.94 1.83*  CALCIUM 7.9* 8.1*  MG -- --  PHOS -- --   Liver Function Tests: No results found for this basename: AST:5,ALT:5,ALKPHOS:5,BILITOT:5,PROT:5,ALBUMIN:5 in the last 168 hours CBC:  Lab 12/25/11 0531 12/24/11 0602 12/23/11 0905 12/21/11 1000 12/19/11 0841  WBC 7.0 5.6 5.2 4.4 4.7  NEUTROABS -- -- -- -- 3.2  HGB 7.5* 7.4* 7.4* 7.7* 7.5*  HCT 22.7* 22.6* 22.6* 23.5* 22.8*  MCV 76.9* 76.9* 76.6* 76.5* 77.6*  PLT 219 196 180 178 169   Cardiac Enzymes:  Lab 12/18/11 2358 12/18/11 1815  CKTOTAL -- --  CKMB -- --  CKMBINDEX -- --  TROPONINI <0.30 <0.30   BNP: No components found with this basename: POCBNP:5 CBG:  Lab 12/25/11 0733 12/24/11 2109 12/24/11 1644 12/24/11 1146 12/24/11 0753  GLUCAP 118* 140* 135* 152* 133*    Recent Results (from the past 240 hour(s))  CULTURE, BLOOD (ROUTINE X 2)     Status: Normal   Collection Time   12/16/11 12:05 AM      Component Value Range Status Comment   Specimen Description BLOOD RIGHT HAND   Final    Special Requests BOTTLES DRAWN AEROBIC AND ANAEROBIC 10CC   Final    Culture  Setup Time 12/16/2011 03:04   Final    Culture NO GROWTH 5 DAYS   Final    Report Status 12/22/2011 FINAL   Final   CULTURE, BLOOD (ROUTINE X 2)     Status: Normal   Collection Time   12/16/11 12:12 AM      Component Value Range Status Comment   Specimen Description BLOOD RIGHT ARM   Final    Special Requests BOTTLES DRAWN AEROBIC AND ANAEROBIC 10C   Final    Culture  Setup Time 12/16/2011 03:04   Final    Culture NO GROWTH 5 DAYS   Final    Report Status 12/22/2011 FINAL   Final   URINE CULTURE      Status: Normal   Collection Time   12/16/11 11:06 AM      Component Value Range Status Comment   Specimen Description URINE, CLEAN CATCH   Final    Special Requests NONE   Final    Culture  Setup Time 12/16/2011 13:04   Final    Colony Count >=100,000 COLONIES/ML   Final    Culture     Final    Value: STAPHYLOCOCCUS SPECIES (COAGULASE NEGATIVE)     Note: RIFAMPIN AND GENTAMICIN SHOULD NOT BE USED AS SINGLE DRUGS FOR TREATMENT OF STAPH INFECTIONS.   Report Status 12/18/2011 FINAL   Final    Organism ID, Bacteria STAPHYLOCOCCUS SPECIES (COAGULASE NEGATIVE)   Final   URINE CULTURE  Status: Normal   Collection Time   12/17/11 11:35 PM      Component Value Range Status Comment   Specimen Description URINE, CATHETERIZED   Final    Special Requests NONE   Final    Culture  Setup Time 12/18/2011 09:23   Final    Colony Count NO GROWTH   Final    Culture NO GROWTH   Final    Report Status 12/19/2011 FINAL   Final   CLOSTRIDIUM DIFFICILE BY PCR     Status: Normal   Collection Time   12/24/11  7:55 AM      Component Value Range Status Comment   C difficile by pcr NEGATIVE  NEGATIVE Final      Studies: Dg Chest Port 1 View  12/24/2011  *RADIOLOGY REPORT*  Clinical Data: Pain with deep inspiration  PORTABLE CHEST - 1 VIEW  Comparison: 12/23/2011  Findings: Cardiomegaly again noted.  Status post CABG.  Dual lead cardiac pacemaker is unchanged in position.  Persistent mild interstitial edema bilaterally.  Stable bilateral basilar atelectasis or infiltrate.  IMPRESSION: Dual lead cardiac pacemaker is unchanged in position.  Persistent mild interstitial edema bilaterally.  Stable bilateral basilar atelectasis or infiltrate.   Original Report Authenticated By: Natasha Mead, M.D.     Scheduled Meds:    . aspirin  81 mg Oral Daily  . atorvastatin  20 mg Oral q1800  . cilostazol  100 mg Oral BID  . clopidogrel  75 mg Oral Daily  . gabapentin  100 mg Oral BID  . insulin aspart  0-20 Units  Subcutaneous TID WC  . insulin aspart  0-5 Units Subcutaneous QHS  . levofloxacin  250 mg Oral Daily  . levothyroxine  50 mcg Oral QAC breakfast  . loratadine  10 mg Oral Daily  . magnesium citrate  1 Bottle Oral Weekly  . nystatin  500,000 Units Oral QID  . pantoprazole  40 mg Oral Daily  . Vitamin D (Ergocalciferol)  50,000 Units Oral Q7 days   Continuous Infusions:    . sodium chloride 50 mL/hr at 12/25/11 1031

## 2011-12-25 NOTE — Evaluation (Signed)
Clinical/Bedside Swallow Evaluation Patient Details  Name: [name] MRN: [id number] Date of Birth: 1920/06/25  Today's Date: 12/25/2011 Time: 9562-1308 SLP Time Calculation (min): 38 min  Past Medical History:  Past Medical History  Diagnosis Date  . Diabetes mellitus   . Hypertension   . MI (myocardial infarction)   . Coronary artery disease   . Legally blind    Past Surgical History:  Past Surgical History  Procedure Date  . Pacemaker insertion   . Coronary artery bypass graft   . Cardiac catheterization   . Esophagogastroduodenoscopy 12/22/2011    Procedure: ESOPHAGOGASTRODUODENOSCOPY (EGD);  Surgeon: Iva Boop, MD;  Location: [facility];  Service: Endoscopy;  Laterality: N/A;   HPI:  78 yo female adm to [facility] with sepsis secondary to staph from UTI.  Pt with diagnosis of ulcer and hiatal hernia per EGD 12-5 and is taking a PPI with reported improvement in symptoms.  CXR 12/8 showed persistent edema bilaterally, atelectasis, or infiltrate.  PMH + for blindness, DM, HTN, anemia, CKD, dyspepsia, anorexia, malnutrition.  Pt reports weight loss due to nausea associated with po and food poor tasting.     Assessment / Plan / Recommendation Clinical Impression  Pt presents with s/s of suspected primary esophageal/GI issues:  characterized by frequent belching and complaint of nausea with intake. Note results of EGD.  Pt with noted improvement since taking a PPI.  Pt without clinical s/s of aspiration and voice remained clear throughout all po intake- although pt with poor intake.  Advised pt and children to aspiration precautions given pt's respiratory issues (frequent dyspnea).  Offered to provide pt with list of reflux prudent foods, but she politely declined stating she is managing her symptoms and knows what to eat.  No further SLP indicated.  Thanks.     Aspiration Risk  Mild    Diet Recommendation Regular;Thin liquid   Liquid Administration via: Straw Medication  Administration:  (as tolerated) Supervision: Full supervision/cueing for compensatory strategies Compensations: Slow rate;Small sips/bites Postural Changes and/or Swallow Maneuvers: Seated upright 90 degrees;Upright 30-60 min after meal    Other  Recommendations Oral Care Recommendations: Oral care BID   Follow Up Recommendations  None    Frequency and Duration        Pertinent Vitals/Pain Afebrile, decreased     SLP Swallow Goals   N/a eval and d'c  Swallow Study Prior Functional Status       General Date of Onset: 12/25/11 HPI: 78 yo female adm to [facility] with sepsis secondary to staph from UTI.  Pt with diagnosis of ulcer and hiatal hernia per EGD 12-5 and is taking a PPI with reported improvement in symptoms.  CXR 12/8 showed persistent edema bilaterally, atelectasis, or infiltrate.  PMH + for blindness, DM, HTN, anemia, CKD, dyspepsia, anorexia, malnutrition.  Pt reports weight loss due to nausea associated with po and food poor tasting.   Type of Study: Bedside swallow evaluation Diet Prior to this Study: Regular;Thin liquids Temperature Spikes Noted: No Respiratory Status: Supplemental O2 delivered via (comment) (2 liters) Behavior/Cognition: Alert;Cooperative Oral Cavity - Dentition: Dentures, top;Dentures, bottom (loose dentures per pt, ? need for new dentures) Self-Feeding Abilities: Needs assist Patient Positioning: Upright in bed Baseline Vocal Quality: Clear Volitional Cough: Strong Volitional Swallow: Able to elicit    Oral/Motor/Sensory Function Overall Oral Motor/Sensory Function: Appears within functional limits for tasks assessed   Ice Chips Ice chips: Not tested   Thin Liquid Thin Liquid: Within functional limits Presentation: Self Fed;Straw  Other Comments: hand over hand assist to hold cup    Nectar Thick Nectar Thick Liquid: Within functional limits Presentation: Straw;Self Fed Other Comments: hand over hand assist   Honey Thick Honey Thick Liquid: Not  tested   Puree Puree: Not tested   Solid   GO    Solid: Within functional limits Presentation: Self Fed Other Comments: crackers:  few bites, pt stated "they are too sweet"       Donavan Burnet, MS [facility] SLP [phone]

## 2011-12-26 LAB — GLUCOSE, CAPILLARY: Glucose-Capillary: 119 mg/dL — ABNORMAL HIGH (ref 70–99)

## 2011-12-26 LAB — BASIC METABOLIC PANEL
BUN: 3 mg/dL — ABNORMAL LOW (ref 6–23)
Calcium: 8 mg/dL — ABNORMAL LOW (ref 8.4–10.5)
GFR calc non Af Amer: 53 mL/min — ABNORMAL LOW (ref >90)
Glucose, Bld: 110 mg/dL — ABNORMAL HIGH (ref 70–99)
Sodium: 136 mEq/L (ref 135–145)

## 2011-12-26 LAB — CBC
HCT: 22.2 % — ABNORMAL LOW (ref 36.0–46.0)
Hemoglobin: 7.2 g/dL — ABNORMAL LOW (ref 12.0–15.0)
MCH: 25 pg — ABNORMAL LOW (ref 26.0–34.0)
MCHC: 32.4 g/dL (ref 30.0–36.0)

## 2011-12-26 MED ORDER — METOPROLOL TARTRATE 25 MG PO TABS: 12.5000 mg | ORAL_TABLET | Freq: Two times a day (BID) | ORAL | Status: DC

## 2011-12-26 MED ORDER — ENSURE COMPLETE PO LIQD: 237.0000 mL | Freq: Two times a day (BID) | ORAL | Status: DC

## 2011-12-26 MED ORDER — PANTOPRAZOLE SODIUM 40 MG PO TBEC: 40.0000 mg | DELAYED_RELEASE_TABLET | Freq: Two times a day (BID) | ORAL | Status: DC

## 2011-12-26 MED ORDER — METOPROLOL TARTRATE 12.5 MG HALF TABLET
12.5000 mg | ORAL_TABLET | Freq: Two times a day (BID) | ORAL | Status: DC
  Administered 2011-12-26: 12.5 mg via ORAL
  Filled 2011-12-26: qty 1

## 2011-12-26 MED ORDER — POTASSIUM CHLORIDE CRYS ER 20 MEQ PO TBCR
40.0000 meq | EXTENDED_RELEASE_TABLET | ORAL | Status: DC
  Filled 2011-12-26 (×2): qty 2

## 2011-12-26 MED ORDER — POTASSIUM CHLORIDE CRYS ER 20 MEQ PO TBCR
40.0000 meq | EXTENDED_RELEASE_TABLET | Freq: Once | ORAL | Status: AC
  Administered 2011-12-26: 40 meq via ORAL
  Filled 2011-12-26: qty 2

## 2011-12-26 NOTE — Progress Notes (Signed)
Patient family member request that they be supplied a hoyer lift for home use.  Informed the family that we would give this request to Darl Pikes with [facility] in the am and she would get in touch with them. Phone [facility] and arranged transport home for 9pm this evening

## 2011-12-26 NOTE — Progress Notes (Signed)
Physical Therapy Treatment Patient Details Name: [name] MRN: [id number] DOB: Apr 11, 1920 Today's Date: 12/26/2011 Time: 4098-1191 PT Time Calculation (min): 25 min  PT Assessment / Plan / Recommendation Comments on Treatment Session  Attempted tx session with goal to work on standing and weightshifting to allow for pivoting-pt declined to attempt standing on today. Agreeable to sit EOB and to perform exercises. Fatigues easily. Continues to require heavy +2 for all mobility. Daughter present and states plan is still for d/c home on tomorrow.     Follow Up Recommendations  Home health PT;Supervision/Assistance - 24 hour (Family refusing SNF)     Does the patient have the potential to tolerate intense rehabilitation     Barriers to Discharge        Equipment Recommendations  Other (comment) (mechanical lift). Will need ambulance transport home.     Recommendations for Other Services    Frequency Min 3X/week   Plan Discharge plan remains appropriate    Precautions / Restrictions Precautions Precautions: Fall Precaution Comments: legally blind Restrictions Weight Bearing Restrictions: No   Pertinent Vitals/Pain No c/o    Mobility  Bed Mobility Bed Mobility: Rolling Left Rolling Left: 2: Max assist Left Sidelying to Sit: 1: +2 Total assist Left Sidelying to Sit: Patient Percentage: 20% Sit to Supine: 1: +2 Total assist Sit to Supine: Patient Percentage: 0% Details for Bed Mobility Assistance: Multimodal cues for technique, hand placement. Assist for trunk to upright/supine and bil LEs off/onto bed.  Transfers Details for Transfer Assistance: Pt declined to attempt standing due to having "shakes."     Exercises General Exercises - Lower Extremity Long Arc Quad: AROM;Both;10 reps;Seated   PT Diagnosis:    PT Problem List:   PT Treatment Interventions:     PT Goals Acute Rehab PT Goals Pt will Roll Supine to Left Side: with min assist PT Goal: Rolling  Supine to Left Side - Progress: Progressing toward goal Pt will go Supine/Side to Sit: with mod assist PT Goal: Supine/Side to Sit - Progress: Progressing toward goal Pt will go Sit to Supine/Side: with mod assist PT Goal: Sit to Supine/Side - Progress: Progressing toward goal  Visit Information  Last PT Received On: 12/26/11 Assistance Needed: +2    Subjective Data  Subjective: "I'll try" Patient Stated Goal: None stated   Cognition  Overall Cognitive Status: Appears within functional limits for tasks assessed/performed Arousal/Alertness: Awake/alert Orientation Level: Appears intact for tasks assessed Behavior During Session: [facility] for tasks performed    Balance  Balance Balance Assessed: Yes Static Sitting Balance Static Sitting - Balance Support: Bilateral upper extremity supported;Feet supported Static Sitting - Level of Assistance: 5: Stand by assistance;4: Min assist Static Sitting - Comment/# of Minutes: Sat EOB 8-10 minutes. Intermittent posterior leaning-verbal/tactile cues for upright posture. Had pt peform LAQs while sitting EOB. Pt had to hold onto side of mattress with both hands to stabilize. Attempted weightshifiting to allow for scooting to [facility] but pt unable.   End of Session PT - End of Session Activity Tolerance: Patient limited by fatigue Patient left: in bed;with call bell/phone within reach;with family/visitor present   GP     [name] 12/26/2011, 4:46 PM [phone]

## 2011-12-26 NOTE — Progress Notes (Signed)
Quick Note:  Biopsy results show atrophic gastritis Does not need specific GI follow-up for this This note is for inpatient chart   ______

## 2011-12-26 NOTE — Progress Notes (Signed)
Discharge instructions reviewed with patient and son at bedside, all questions answered. [facility] arrived, staff assist with transfer to strecther. Patient cleaned by staff and changed into personal clothes prior to discharge. Patient's belongings taken to home by son. VSS: 134/81, 87, 20, 98.1, 97% RA prior to d/c. No c/o from patient.

## 2011-12-26 NOTE — Discharge Summary (Signed)
I have seen and examined pt, she states dypepsia/belching much improved today on increased dose of PPI. Her hgb is stable with no gross bleeding. She completed full course of abx in the hospital and has remained afebrile and hemodynamically stable - she is medically stable for d/c to follow up outpt - I agree with Anna's(NP) eval and plan.  Donnalee Curry MD [facility] [phone]

## 2011-12-26 NOTE — Progress Notes (Signed)
Met with pt's daughter Alona Bene and advised her that her mother has been discharged. She stated they are not ready to take her home and will be ready tomorrow. I offered to have ambulance transportation arranged but she stated they will not be ready till tomorrow. Informed her that insurance might not pay for today since pt is medically ready and d/c order has been written and she stated they are willing to pay the bill if insurance denies to pay.  [facility] [place] Unit director made aware of situation.

## 2011-12-26 NOTE — Discharge Summary (Signed)
DISCHARGE SUMMARY  [name]  MR#: [id number]  DOB:Jul 19, 1920  Date of Admission: 12/17/2011 Date of Discharge: 12/26/2011  Attending Physician:Trine Fread S  Patient's AOZ:HYQM, [name], MD  Consults:  Palliative Jannette Fogo, NP- on 12/21/11) Cardiology (Dr. Lennette Bihari- on 12/18/11) Gastroenterology (Dr. Iva Boop- on 12/18/11) Physical Therapy Lyman Speller Page, PT- on12/8/13) Nutrition Evaluation Lavena Bullion, RD- on 12/25/11)  Discharge Diagnoses: Present on Admission:  . Acute on chronic renal failure . Bacterial UTI . Sepsis due to other specified Staphylococcus . Diabetes mellitus . Pacemaker, medtronic Adapta 06/29/06  secondary to symptomatic bradycardia and 2nd degree heart block . Hypotension . Microcytic anemia . Hyponatremia . Anemia of chronic disease     Medication List     As of 12/26/2011  4:27 PM    STOP taking these medications         levofloxacin 250 MG tablet   Commonly known as: LEVAQUIN      magnesium citrate Soln      nystatin 100000 UNIT/ML suspension   Commonly known as: MYCOSTATIN      omeprazole 20 MG capsule   Commonly known as: PRILOSEC   Replaced by: pantoprazole 40 MG tablet      TAKE these medications         aspirin 81 MG chewable tablet   Chew 81 mg by mouth daily.      cilostazol 100 MG tablet   Commonly known as: PLETAL   Take 100 mg by mouth 2 (two) times daily.      clopidogrel 75 MG tablet   Commonly known as: PLAVIX   Take 75 mg by mouth daily.      feeding supplement Liqd   Take 237 mLs by mouth 2 (two) times daily between meals.      gabapentin 100 MG capsule   Commonly known as: NEURONTIN   Take 100 mg by mouth 2 (two) times daily.      levothyroxine 50 MCG tablet   Commonly known as: SYNTHROID, LEVOTHROID   Take 50 mcg by mouth daily.      loratadine 10 MG tablet   Commonly known as: CLARITIN   Take 10 mg by mouth daily as needed. For allergies      metoprolol tartrate 25 MG  tablet   Commonly known as: LOPRESSOR   Take 0.5 tablets (12.5 mg total) by mouth 2 (two) times daily.      pantoprazole 40 MG tablet   Commonly known as: PROTONIX   Take 1 tablet (40 mg total) by mouth 2 (two) times daily before a meal.      rosuvastatin 20 MG tablet   Commonly known as: CRESTOR   Take 10 mg by mouth daily.      Vitamin D (Ergocalciferol) 50000 UNITS Caps   Commonly known as: DRISDOL   Take 50,000 Units by mouth every 7 (seven) days. Mondays        Hospital Course: 1) Sepsis Syndrome secondary to coagulase negative Staph urinary tract infection Patient was initially on zosyn but once urine culture results were available and staph coag neg species (likely saprophyticus as the only one in that group) was identified, she was transitioned to Levaquin based on urine culture sensitivities. She received Levaquin from 12/19/2011 for total of 7 days. Blood cultures remained negative to date. Her condition improved following fluid resuscitation with NS and the need for adequate po intake was addressed with family at the time of discharge.  2) Anemia  of Chronic Disease symptomatic anemia during her recent admission on 11/28-12/1/13. She was transfused 2 units PRBC's and discharged with a Hgb 9.3 on 12/17/11. Hgb was 7.5 on 12/19/11 and remained fairly stable this admission ranging 7.4-->7.7 with an asymptomatic Hgb 7.2 prior to discharge. Of note, unclear how much her underlying CKD stage 3-4 is a factor with her anemia status. She will have repeat labs on Monday 01/01/12. Discharge instructions to monitor and inform MD ASAP coffee ground emesis and dark tarry stools in the interim.  3) Gastric Ulcer- upper endoscopy on 12/22/11 with identification small clean based ulcer 3x4 mm gastric body. Biopsies taken. Recommendation to continue PPI and await biopsy. Of note, contacted her cardiology practice and spoke to Dr. Landry Dyke NP, Nada Boozer regarding the need to continue current antiplatelet  regimen (Pletal, Plavix, and ASA) noting high risk GIB loss. Recommendation was to continue these medications at discharge and this will be addressed with follow up appointment in the outpatient setting. 4) Hiatal Hernia Nausea and dyspepsia were problematic on admission. GI consult with EGD as noted above.  2mm hiatal hernia identified. Protonix increased to 40 mg BID. Symptoms improved and she will continue the same at discharge. 5) Diabetes mellitus/HLD/Diabetic Peripheral Neuropathy CBG with SSI regimen this admission. Most recent HgbA1c 7.7% on 12/14/11. She remained on her home dose of Neurontin and Pletal. Lipitor changed to Crestor for ongoing lipid lowering therapy. Resume Lipitor at discharge with management per primary care. Defer ongoing use of Pletal to her cardiologist. Noting her poor intake, will also defer additional diabetic management to her primary.  6) Acute on Chronic Kidney Disease, stage 3 Underlying CKD with poor intake and easily volume depleted. Baseline creatinine 1.67. Creatinine levels fluctuated 1.67- 2.34 from 08/21/11- 11/17/11 and noted to have creatinine level 2.13 on 12/18/11, which resolved following fluid ressucitation and noted to be 0.93 (eGFR 61) this am prior to discharge. Of note, Low albumin 2.1 on 12/17/11 felt to be contributing factor and the need to avoid excess diuresis and/ or provide gentle diuresis whenever absolutely necessary is recommended. 7) Hyponatremia This was felt secondary to dehydration and resolved with IV fluids. Re-evaluate with repeat labs outpatient setting.  8) Protein calorie malnutrition/ Adult Failure to Thrive Family reported 30 lb weight loss >3 months-42yr. Nutrition consult revealed intake 25-100% with weight down 8 lbs since admission. Placed on Ensure Complete ID; Rx to continue after discharge. Recommendation MD discuss enteral nutrition if intake remains inadequate and weight loss persists. This can be addressed in the outpatient  setting after discharge. Bedside swallow evaluation done 12/25/11. She was without significant s/s aspiration and mild risk. Recommendation to continue regular diet consistency with thin liquids and supervision (visual deficit). 9) Hypokalemia Transient this admission. K level 4.1 on admission. Trended down 3.3 on 12/8 and repeat 3.4 and repleated with po KCl prior to discharge. Without N/N/D at time of discharge and will have repeat labs at follow up PCP office visit  10) Hypotension Low BP's noted on admission and was felt secondary to sepsis. Continued to resolve at time of discharge. Metoprolol held on admission. Volume resuscitated. BP improved. PMH CAD s/p CABG 2008, Pacemaker (medtronic Adapta 06/29/06) secondary to symptomatic bradycardia and 2nd degree heart block. HR 95-103 at rest. Resumed low dose Metoprolol 12.5 mg BID with parameters to hold for SBP <110 and/or HR <60. Same parameters after discharge. Follow up with cardiology regarding ongoing use triple antiplatelet therapy and consideration adding low dose nitrates. 11) CAD with  Elevated BNP- BNP 2026 on 12/17/11; some DOE; CXR on 12/24/11 with persistent interstitial edema; 2D Echo on 12/19/11 E. F. 55-65%, hyperdynamic systolic function and left ventricular diastolic dysfunction; albumin 2.1 with recurrent AOCRF as noted above. Oxygen Sat 94-97% on room air prior to discharge. Re-evaluate resp status and BNP with follow up care as noted below.  12) Hypothyroidism She remained on her home dose of Synthroid. Most recent TSH level 0.879 on 12/15/11.   13) Deconditioning History neuropathy and legally blind (can see somewhat out left eye). Seen by PT in consultation. Was unable to weight shift for LE Advancement. Recommendation benefit from skilled PT at SNF to maximize safety. Family desired home care. Further recommendations for mechanical lift. Arrangements made for home PT/OT ([facility]); has hospital bed, rolling walker, and 3-in-1 at  home per Case Management Windell Moulding Miringu, RN)   Day of Discharge BP 103/61  Pulse 95  Temp 98.6 F (37 C) (Oral)  Resp 20  Ht 5\' 4"  (1.626 m)  Wt 97.523 kg (215 lb)  BMI 36.90 kg/m2  SpO2 98%  Physical Exam: General: Pt is awake, alert and not in acute distress. Legally blind  Cardiovascular: Regular rate and rhythm, normal S1/S2, no murmurs, rubs, or gallops auscultated, no JVD  Respiratory: slightly diminished bases without adventitious breath sounds. No audible wheezes, rhonchi, or rales. pacemaker palpable left chest.   Abdomen: Soft, obese, with some epigastric tenderness, no guarding, non distended, bowel sounds present; no organomegaly appreciated.  Extremities: No edema, pulses DP and PT palpable bilaterally Neuro: Grossly intact, nonfocal  Results for orders placed during the hospital encounter of 12/17/11 (from the past 24 hour(s))  GLUCOSE, CAPILLARY     Status: Abnormal   Collection Time   12/25/11  4:43 PM      Component Value Range   Glucose-Capillary 116 (*) 70 - 99 mg/dL   Comment 1 Notify RN    GLUCOSE, CAPILLARY     Status: Abnormal   Collection Time   12/25/11 10:11 PM      Component Value Range   Glucose-Capillary 104 (*) 70 - 99 mg/dL  CBC     Status: Abnormal   Collection Time   12/26/11  5:34 AM      Component Value Range   WBC 5.6  4.0 - 10.5 K/uL   RBC 2.88 (*) 3.87 - 5.11 MIL/uL   Hemoglobin 7.2 (*) 12.0 - 15.0 g/dL   HCT 16.1 (*) 09.6 - 04.5 %   MCV 77.1 (*) 78.0 - 100.0 fL   MCH 25.0 (*) 26.0 - 34.0 pg   MCHC 32.4  30.0 - 36.0 g/dL   RDW 40.9 (*) 81.1 - 91.4 %   Platelets 200  150 - 400 K/uL  BASIC METABOLIC PANEL     Status: Abnormal   Collection Time   12/26/11  5:34 AM      Component Value Range   Sodium 136  135 - 145 mEq/L   Potassium 3.4 (*) 3.5 - 5.1 mEq/L   Chloride 107  96 - 112 mEq/L   CO2 22  19 - 32 mEq/L   Glucose, Bld 110 (*) 70 - 99 mg/dL   BUN 3 (*) 6 - 23 mg/dL   Creatinine, Ser 7.82  0.50 - 1.10 mg/dL   Calcium 8.0 (*)  8.4 - 10.5 mg/dL   GFR calc non Af Amer 53 (*) >90 mL/min   GFR calc Af Amer 61 (*) >90 mL/min  GLUCOSE, CAPILLARY  Status: Normal   Collection Time   12/26/11  7:57 AM      Component Value Range   Glucose-Capillary 94  70 - 99 mg/dL  GLUCOSE, CAPILLARY     Status: Abnormal   Collection Time   12/26/11 11:32 AM      Component Value Range   Glucose-Capillary 114 (*) 70 - 99 mg/dL   Comment 1 Notify RN     Code Status: Full Code as discussed extensively with family during Palliative Care Consult  Disposition: No acute distress noted when seen prior to discharge. Spoke to Niece (NP) extensively regarding condition and plan of care at time of discharge. Home with family (refussal SNF); will have Home Health Care (PT/OT, RN, NA)   Follow-up Appts:  Dr. Willey Blade on Monday 01/01/12 at 10:00; Dr. Royann Shivers (cardiology) on 01/08/12 2:15 pm  Call within 1 wk to schedule a follow up appointment with Dr. Leone Payor within 2 wks of discharge  Discharge Orders    Future Orders Please Complete By Expires   Discharge patient      Comments:   Discharge home with family this evening (will have Home Health PT/OT/RN/NA to follow)      Tests Needing Follow-up: Recommend CBC, BMP, BNP with follow up office visit PCP on 01/01/12  Signed: Lizbeth Bark 12/26/2011, 4:28 PM

## 2011-12-29 ENCOUNTER — Emergency Department (HOSPITAL_COMMUNITY): Payer: Medicare Other

## 2011-12-29 ENCOUNTER — Encounter (HOSPITAL_COMMUNITY): Payer: Self-pay | Admitting: *Deleted

## 2011-12-29 ENCOUNTER — Inpatient Hospital Stay (HOSPITAL_COMMUNITY)
Admission: EM | Admit: 2011-12-29 | Discharge: 2012-01-03 | DRG: 690 | Disposition: A | Discharge status: Home Health | Payer: Medicare Other | Attending: Internal Medicine | Admitting: Internal Medicine

## 2011-12-29 DIAGNOSIS — K449 Diaphragmatic hernia without obstruction or gangrene: Secondary | ICD-10-CM

## 2011-12-29 DIAGNOSIS — I252 Old myocardial infarction: Secondary | ICD-10-CM

## 2011-12-29 DIAGNOSIS — R791 Abnormal coagulation profile: Secondary | ICD-10-CM | POA: Diagnosis present

## 2011-12-29 DIAGNOSIS — Z950 Presence of cardiac pacemaker: Secondary | ICD-10-CM

## 2011-12-29 DIAGNOSIS — Z951 Presence of aortocoronary bypass graft: Secondary | ICD-10-CM | POA: Diagnosis present

## 2011-12-29 DIAGNOSIS — R079 Chest pain, unspecified: Secondary | ICD-10-CM

## 2011-12-29 DIAGNOSIS — E119 Type 2 diabetes mellitus without complications: Secondary | ICD-10-CM | POA: Diagnosis present

## 2011-12-29 DIAGNOSIS — K3189 Other diseases of stomach and duodenum: Secondary | ICD-10-CM | POA: Diagnosis present

## 2011-12-29 DIAGNOSIS — I2510 Atherosclerotic heart disease of native coronary artery without angina pectoris: Secondary | ICD-10-CM | POA: Diagnosis present

## 2011-12-29 DIAGNOSIS — Z79899 Other long term (current) drug therapy: Secondary | ICD-10-CM

## 2011-12-29 DIAGNOSIS — E8779 Other fluid overload: Secondary | ICD-10-CM | POA: Diagnosis present

## 2011-12-29 DIAGNOSIS — H548 Legal blindness, as defined in USA: Secondary | ICD-10-CM | POA: Diagnosis present

## 2011-12-29 DIAGNOSIS — A499 Bacterial infection, unspecified: Secondary | ICD-10-CM

## 2011-12-29 DIAGNOSIS — I209 Angina pectoris, unspecified: Secondary | ICD-10-CM | POA: Diagnosis present

## 2011-12-29 DIAGNOSIS — R0602 Shortness of breath: Secondary | ICD-10-CM

## 2011-12-29 DIAGNOSIS — E46 Unspecified protein-calorie malnutrition: Secondary | ICD-10-CM

## 2011-12-29 DIAGNOSIS — E8770 Fluid overload, unspecified: Secondary | ICD-10-CM | POA: Diagnosis present

## 2011-12-29 DIAGNOSIS — Z880 Allergy status to penicillin: Secondary | ICD-10-CM

## 2011-12-29 DIAGNOSIS — R5381 Other malaise: Secondary | ICD-10-CM | POA: Diagnosis present

## 2011-12-29 DIAGNOSIS — D509 Iron deficiency anemia, unspecified: Secondary | ICD-10-CM | POA: Diagnosis present

## 2011-12-29 DIAGNOSIS — I10 Essential (primary) hypertension: Secondary | ICD-10-CM | POA: Diagnosis present

## 2011-12-29 DIAGNOSIS — N390 Urinary tract infection, site not specified: Principal | ICD-10-CM | POA: Diagnosis present

## 2011-12-29 DIAGNOSIS — D638 Anemia in other chronic diseases classified elsewhere: Secondary | ICD-10-CM

## 2011-12-29 DIAGNOSIS — E118 Type 2 diabetes mellitus with unspecified complications: Secondary | ICD-10-CM | POA: Diagnosis present

## 2011-12-29 DIAGNOSIS — R1013 Epigastric pain: Secondary | ICD-10-CM

## 2011-12-29 DIAGNOSIS — K259 Gastric ulcer, unspecified as acute or chronic, without hemorrhage or perforation: Secondary | ICD-10-CM | POA: Diagnosis present

## 2011-12-29 LAB — COMPREHENSIVE METABOLIC PANEL
ALT: <5 U/L (ref 0–35)
AST: 14 U/L (ref 0–37)
Alkaline Phosphatase: 111 U/L (ref 39–117)
CO2: 21 mEq/L (ref 19–32)
Chloride: 105 mEq/L (ref 96–112)
GFR calc non Af Amer: 59 mL/min — ABNORMAL LOW (ref >90)
Sodium: 136 mEq/L (ref 135–145)
Total Bilirubin: 0.6 mg/dL (ref 0.3–1.2)

## 2011-12-29 LAB — CBC WITH DIFFERENTIAL/PLATELET
Basophils Relative: 0 % (ref 0–1)
Eosinophils Absolute: 0 10*3/uL (ref 0.0–0.7)
MCH: 24.5 pg — ABNORMAL LOW (ref 26.0–34.0)
MCHC: 32 g/dL (ref 30.0–36.0)
Monocytes Relative: 10 % (ref 3–12)
Neutrophils Relative %: 74 % (ref 43–77)
Platelets: 223 10*3/uL (ref 150–400)
RDW: 18.9 % — ABNORMAL HIGH (ref 11.5–15.5)

## 2011-12-29 LAB — TROPONIN I: Troponin I: <0.3 ng/mL (ref <0.30)

## 2011-12-29 LAB — URINALYSIS, ROUTINE W REFLEX MICROSCOPIC
Hgb urine dipstick: NEGATIVE
Protein, ur: NEGATIVE mg/dL
Urobilinogen, UA: 0.2 mg/dL (ref 0.0–1.0)

## 2011-12-29 LAB — URINE MICROSCOPIC-ADD ON

## 2011-12-29 LAB — D-DIMER, QUANTITATIVE: D-Dimer, Quant: 2.14 ug/mL-FEU — ABNORMAL HIGH (ref 0.00–0.48)

## 2011-12-29 LAB — LACTIC ACID, PLASMA: Lactic Acid, Venous: 1.6 mmol/L (ref 0.5–2.2)

## 2011-12-29 LAB — GLUCOSE, CAPILLARY
Glucose-Capillary: 160 mg/dL — ABNORMAL HIGH (ref 70–99)
Glucose-Capillary: 142 mg/dL — ABNORMAL HIGH (ref 70–99)

## 2011-12-29 MED ORDER — ACETAMINOPHEN 325 MG PO TABS: 650.0000 mg | ORAL_TABLET | Freq: Once | ORAL | Status: DC

## 2011-12-29 MED ORDER — ACETAMINOPHEN 325 MG PO TABS
650.0000 mg | ORAL_TABLET | Freq: Once | ORAL | Status: AC
  Administered 2011-12-29: 650 mg via ORAL

## 2011-12-29 MED ORDER — GABAPENTIN 100 MG PO CAPS
100.0000 mg | ORAL_CAPSULE | Freq: Two times a day (BID) | ORAL | Status: DC
  Administered 2011-12-29 – 2012-01-03 (×10): 100 mg via ORAL
  Filled 2011-12-29 (×11): qty 1

## 2011-12-29 MED ORDER — ONDANSETRON HCL 4 MG PO TABS: 4.0000 mg | ORAL_TABLET | Freq: Four times a day (QID) | ORAL | Status: DC | PRN

## 2011-12-29 MED ORDER — ATORVASTATIN CALCIUM 20 MG PO TABS
20.0000 mg | ORAL_TABLET | Freq: Every day | ORAL | Status: DC
  Administered 2011-12-29 – 2012-01-02 (×5): 20 mg via ORAL
  Filled 2011-12-29 (×6): qty 1

## 2011-12-29 MED ORDER — LEVOTHYROXINE SODIUM 50 MCG PO TABS
50.0000 ug | ORAL_TABLET | Freq: Every day | ORAL | Status: DC
  Administered 2011-12-30 – 2012-01-03 (×5): 50 ug via ORAL
  Filled 2011-12-29 (×7): qty 1

## 2011-12-29 MED ORDER — ASPIRIN 81 MG PO CHEW
81.0000 mg | CHEWABLE_TABLET | Freq: Every day | ORAL | Status: DC
  Administered 2011-12-29: 81 mg via ORAL
  Filled 2011-12-29 (×2): qty 1

## 2011-12-29 MED ORDER — ZOLPIDEM TARTRATE 5 MG PO TABS
2.5000 mg | ORAL_TABLET | Freq: Every evening | ORAL | Status: DC | PRN
  Administered 2011-12-30 – 2012-01-02 (×4): 2.5 mg via ORAL
  Filled 2011-12-29 (×5): qty 1

## 2011-12-29 MED ORDER — CILOSTAZOL 100 MG PO TABS
100.0000 mg | ORAL_TABLET | Freq: Two times a day (BID) | ORAL | Status: DC
  Administered 2011-12-29: 100 mg via ORAL
  Filled 2011-12-29 (×3): qty 1

## 2011-12-29 MED ORDER — TECHNETIUM TO 99M ALBUMIN AGGREGATED
5.8000 | Freq: Once | INTRAVENOUS | Status: AC | PRN
  Administered 2011-12-29: 6 via INTRAVENOUS

## 2011-12-29 MED ORDER — SODIUM CHLORIDE 0.9 % IJ SOLN: 3.0000 mL | INTRAMUSCULAR | Status: DC | PRN

## 2011-12-29 MED ORDER — ALUM & MAG HYDROXIDE-SIMETH 200-200-20 MG/5ML PO SUSP
30.0000 mL | Freq: Four times a day (QID) | ORAL | Status: DC | PRN
  Administered 2011-12-30 – 2011-12-31 (×3): 30 mL via ORAL
  Filled 2011-12-29 (×3): qty 30

## 2011-12-29 MED ORDER — ENSURE COMPLETE PO LIQD
237.0000 mL | ORAL | Status: DC
  Administered 2011-12-29 – 2012-01-02 (×7): 237 mL via ORAL

## 2011-12-29 MED ORDER — METOPROLOL TARTRATE 12.5 MG HALF TABLET
12.5000 mg | ORAL_TABLET | Freq: Two times a day (BID) | ORAL | Status: DC
  Administered 2011-12-29 – 2012-01-03 (×10): 12.5 mg via ORAL
  Filled 2011-12-29 (×11): qty 1

## 2011-12-29 MED ORDER — SODIUM CHLORIDE 0.9 % IJ SOLN
3.0000 mL | Freq: Two times a day (BID) | INTRAMUSCULAR | Status: DC
  Administered 2011-12-30 – 2011-12-31 (×2): 3 mL via INTRAVENOUS

## 2011-12-29 MED ORDER — ACETAMINOPHEN 325 MG PO TABS
ORAL_TABLET | ORAL | Status: AC
  Filled 2011-12-29: qty 2

## 2011-12-29 MED ORDER — PANTOPRAZOLE SODIUM 40 MG PO TBEC
40.0000 mg | DELAYED_RELEASE_TABLET | Freq: Two times a day (BID) | ORAL | Status: DC
  Administered 2011-12-30 – 2012-01-03 (×9): 40 mg via ORAL
  Filled 2011-12-29 (×13): qty 1

## 2011-12-29 MED ORDER — CLOPIDOGREL BISULFATE 75 MG PO TABS
75.0000 mg | ORAL_TABLET | Freq: Every day | ORAL | Status: DC
  Administered 2011-12-29: 75 mg via ORAL
  Filled 2011-12-29 (×2): qty 1

## 2011-12-29 MED ORDER — LEVOFLOXACIN IN D5W 500 MG/100ML IV SOLN
500.0000 mg | INTRAVENOUS | Status: DC
  Administered 2011-12-29: 500 mg via INTRAVENOUS
  Filled 2011-12-29 (×2): qty 100

## 2011-12-29 MED ORDER — SODIUM CHLORIDE 0.9 % IV SOLN: 250.0000 mL | INTRAVENOUS | Status: DC | PRN

## 2011-12-29 MED ORDER — SODIUM CHLORIDE 0.9 % IJ SOLN
3.0000 mL | Freq: Two times a day (BID) | INTRAMUSCULAR | Status: DC
  Administered 2011-12-30 – 2011-12-31 (×3): 3 mL via INTRAVENOUS

## 2011-12-29 MED ORDER — TECHNETIUM TC 99M DIETHYLENETRIAME-PENTAACETIC ACID: 45.0000 | Freq: Once | INTRAVENOUS | Status: DC | PRN

## 2011-12-29 MED ORDER — LORATADINE 10 MG PO TABS
10.0000 mg | ORAL_TABLET | Freq: Every day | ORAL | Status: DC
  Administered 2011-12-30 – 2012-01-03 (×5): 10 mg via ORAL
  Filled 2011-12-29 (×5): qty 1

## 2011-12-29 MED ORDER — INSULIN ASPART 100 UNIT/ML ~~LOC~~ SOLN
0.0000 [IU] | Freq: Three times a day (TID) | SUBCUTANEOUS | Status: DC
  Administered 2011-12-30 – 2012-01-01 (×2): 1 [IU] via SUBCUTANEOUS
  Administered 2012-01-02: 2 [IU] via SUBCUTANEOUS
  Administered 2012-01-03 (×2): 1 [IU] via SUBCUTANEOUS

## 2011-12-29 MED ORDER — ONDANSETRON HCL 4 MG/2ML IJ SOLN: 4.0000 mg | Freq: Four times a day (QID) | INTRAMUSCULAR | Status: DC | PRN

## 2011-12-29 MED ORDER — VANCOMYCIN HCL IN DEXTROSE 1-5 GM/200ML-% IV SOLN
1000.0000 mg | Freq: Once | INTRAVENOUS | Status: AC
  Administered 2011-12-29: 1000 mg via INTRAVENOUS
  Filled 2011-12-29: qty 200

## 2011-12-29 MED ORDER — CEFTRIAXONE SODIUM 1 G IJ SOLR
1.0000 g | Freq: Once | INTRAMUSCULAR | Status: AC
  Administered 2011-12-29: 1 g via INTRAVENOUS
  Filled 2011-12-29: qty 10

## 2011-12-29 NOTE — ED Notes (Signed)
Hospital transporter called and will be able to transport pt upstairs in approx 15 mins.

## 2011-12-29 NOTE — Progress Notes (Signed)
Report given to Victorino Dike, RN to assume care of this patient.  Earnest Conroy. Clelia Croft, RN

## 2011-12-29 NOTE — ED Notes (Signed)
Pt transported to radiology.

## 2011-12-29 NOTE — ED Notes (Signed)
Unable to insert peripheral IV x 2; second RN attempted x 2 unsuccessfully; IV team paged.

## 2011-12-29 NOTE — Progress Notes (Signed)
[facility] ED CM went to assess pt but pt and family not present Pt going for testing

## 2011-12-29 NOTE — Consult Note (Signed)
I have reviewed and discussed the care of this patient in detail with the nurse practitioner including pertinent patient records, physical exam findings and data. I agree with details of this encounter.  

## 2011-12-29 NOTE — ED Provider Notes (Signed)
History     CSN: [id number]  Arrival date & time 12/29/11  1016   First MD Initiated Contact with Patient 12/29/11 1017      Chief Complaint  Patient presents with  . Shortness of Breath    (Consider location/radiation/quality/duration/timing/severity/associated sxs/prior treatment) HPI Complaint of shortness of breath onset last night with a feeling of heaviness in her chest no cough admits to "belching and feeling of sore throat typical of symptoms of GI reflux she's had in the past no fever no treatment prior to coming here. Nothing makes symptoms better or worse. Denies nausea denies vomiting denies other complaint no other associated symptoms Past Medical History  Diagnosis Date  . Diabetes mellitus   . Hypertension   . MI (myocardial infarction)   . Coronary artery disease   . Legally blind    anemia, renal failure, UTI, gastric ulcer  Past Surgical History  Procedure Date  . Pacemaker insertion   . Coronary artery bypass graft   . Cardiac catheterization   . Esophagogastroduodenoscopy 12/22/2011    Procedure: ESOPHAGOGASTRODUODENOSCOPY (EGD);  Surgeon: Iva Boop, MD;  Location: [facility];  Service: Endoscopy;  Laterality: N/A;    Family History  Problem Relation Age of Onset  . Breast cancer Daughter   . Diabetes Son   . Diabetes Daughter   . Heart disease Father   . Diabetes Father   . Colon polyps Daughter     History  Substance Use Topics  . Smoking status: Never Smoker   . Smokeless tobacco: Never Used  . Alcohol Use: No    OB History    Grav Para Term Preterm Abortions TAB SAB Ect Mult Living                  Review of Systems  Respiratory: Positive for shortness of breath.   Cardiovascular: Positive for chest pain.  All other systems reviewed and are negative.    Allergies  Darvon; Other; Penicillins; Percocet; Percodan; and Vicodin  Home Medications   Current Outpatient Rx  Name  Route  Sig  Dispense  Refill  . ASPIRIN 81  MG PO CHEW   Oral   Chew 81 mg by mouth daily.         Marland Kitchen CILOSTAZOL 100 MG PO TABS   Oral   Take 100 mg by mouth 2 (two) times daily.         Marland Kitchen CLOPIDOGREL BISULFATE 75 MG PO TABS   Oral   Take 75 mg by mouth daily.         Marland Kitchen ENSURE COMPLETE PO LIQD   Oral   Take 237 mLs by mouth 2 (two) times daily between meals.   14220 mL   0     14,220 ml = 60 cans ( each)   . GABAPENTIN 100 MG PO CAPS   Oral   Take 100 mg by mouth 2 (two) times daily.          Marland Kitchen LEVOTHYROXINE SODIUM 50 MCG PO TABS   Oral   Take 50 mcg by mouth daily.         Marland Kitchen LORATADINE 10 MG PO TABS   Oral   Take 10 mg by mouth daily as needed. For allergies         . METOPROLOL TARTRATE 25 MG PO TABS   Oral   Take 0.5 tablets (12.5 mg total) by mouth 2 (two) times daily.   30 tablet   0  Hold for SBP <110, HR < 60   . PANTOPRAZOLE SODIUM 40 MG PO TBEC   Oral   Take 1 tablet (40 mg total) by mouth 2 (two) times daily before a meal.   60 tablet   1   . ROSUVASTATIN CALCIUM 20 MG PO TABS   Oral   Take 10 mg by mouth daily.         Marland Kitchen VITAMIN D (ERGOCALCIFEROL) 50000 UNITS PO CAPS   Oral   Take 50,000 Units by mouth every 7 (seven) days. Mondays           BP 133/80  Pulse 111  Temp 100.6 F (38.1 C) (Rectal)  SpO2 94%  Physical Exam  Nursing note and vitals reviewed. Constitutional:       Chronically ill-appearing  nontoxic no respiratory distress  HENT:  Head: Normocephalic and atraumatic.  Eyes: Conjunctivae normal are normal. Pupils are equal, round, and reactive to light.  Neck: Neck supple. No tracheal deviation present. No thyromegaly present.  Cardiovascular: Normal rate and regular rhythm.   No murmur heard. Pulmonary/Chest: Effort normal and breath sounds normal.  Abdominal: Soft. Bowel sounds are normal. She exhibits no distension. There is no tenderness.       Obese  Genitourinary:       Slight amount of blood-tinged stool  Musculoskeletal: Normal range  of motion. She exhibits edema. She exhibits no tenderness.       1+ pretibial pitting edema bilaterally  Neurological: She is alert. Coordination normal.  Skin: Skin is warm and dry. No rash noted.  Psychiatric: She has a normal mood and affect.    ED Course  Procedures (including critical care time)   Labs Reviewed  COMPREHENSIVE METABOLIC PANEL  CBC WITH DIFFERENTIAL  URINALYSIS, ROUTINE W REFLEX MICROSCOPIC  LACTIC ACID, PLASMA   No results found.  Date: 12/29/2011  Rate: 105  Rhythm: Electronically paced rhythm  QRS Axis: left  Intervals: normal  ST/T Wave abnormalities: nonspecific T wave changes  Conduction Disutrbances:left bundle branch block  Narrative Interpretation:   Old EKG Reviewed: No significant change from 12/17/2011 interpreted by me  No significant changes noted when compared with prior ECG. No diagnosis found.  Results for orders placed during the hospital encounter of 12/29/11  COMPREHENSIVE METABOLIC PANEL      Component Value Range   Sodium 136  135 - 145 mEq/L   Potassium 3.7  3.5 - 5.1 mEq/L   Chloride 105  96 - 112 mEq/L   CO2 21  19 - 32 mEq/L   Glucose, Bld 135 (*) 70 - 99 mg/dL   BUN 4 (*) 6 - 23 mg/dL   Creatinine, Ser 4.54  0.50 - 1.10 mg/dL   Calcium 9.0  8.4 - 09.8 mg/dL   Total Protein 6.2  6.0 - 8.3 g/dL   Albumin 2.0 (*) 3.5 - 5.2 g/dL   AST 14  0 - 37 U/L   ALT <5  0 - 35 U/L   Alkaline Phosphatase 111  39 - 117 U/L   Total Bilirubin 0.6  0.3 - 1.2 mg/dL   GFR calc non Af Amer 59 (*) >90 mL/min   GFR calc Af Amer 68 (*) >90 mL/min  CBC WITH DIFFERENTIAL      Component Value Range   WBC 6.9  4.0 - 10.5 K/uL   RBC 3.55 (*) 3.87 - 5.11 MIL/uL   Hemoglobin 8.7 (*) 12.0 - 15.0 g/dL   HCT 11.9 (*) 14.7 - 82.9 %  MCV 76.6 (*) 78.0 - 100.0 fL   MCH 24.5 (*) 26.0 - 34.0 pg   MCHC 32.0  30.0 - 36.0 g/dL   RDW 16.1 (*) 09.6 - 04.5 %   Platelets 223  150 - 400 K/uL   Neutrophils Relative 74  43 - 77 %   Neutro Abs 5.1  1.7 - 7.7  K/uL   Lymphocytes Relative 16  12 - 46 %   Lymphs Abs 1.1  0.7 - 4.0 K/uL   Monocytes Relative 10  3 - 12 %   Monocytes Absolute 0.7  0.1 - 1.0 K/uL   Eosinophils Relative 0  0 - 5 %   Eosinophils Absolute 0.0  0.0 - 0.7 K/uL   Basophils Relative 0  0 - 1 %   Basophils Absolute 0.0  0.0 - 0.1 K/uL  URINALYSIS, ROUTINE W REFLEX MICROSCOPIC      Component Value Range   Color, Urine YELLOW  YELLOW   APPearance CLOUDY (*) CLEAR   Specific Gravity, Urine 1.018  1.005 - 1.030   pH 5.0  5.0 - 8.0   Glucose, UA NEGATIVE  NEGATIVE mg/dL   Hgb urine dipstick NEGATIVE  NEGATIVE   Bilirubin Urine NEGATIVE  NEGATIVE   Ketones, ur NEGATIVE  NEGATIVE mg/dL   Protein, ur NEGATIVE  NEGATIVE mg/dL   Urobilinogen, UA 0.2  0.0 - 1.0 mg/dL   Nitrite NEGATIVE  NEGATIVE   Leukocytes, UA LARGE (*) NEGATIVE  LACTIC ACID, PLASMA      Component Value Range   Lactic Acid, Venous 1.6  0.5 - 2.2 mmol/L  OCCULT BLOOD, POC DEVICE      Component Value Range   Fecal Occult Bld POSITIVE (*) NEGATIVE  D-DIMER, QUANTITATIVE      Component Value Range   D-Dimer, Quant 2.14 (*) 0.00 - 0.48 ug/mL-FEU  URINE MICROSCOPIC-ADD ON      Component Value Range   Squamous Epithelial / LPF FEW (*) RARE   WBC, UA 21-50  <3 WBC/hpf   Bacteria, UA MANY (*) RARE   Urine-Other MUCOUS PRESENT     Ct Abdomen Pelvis Wo Contrast  12/17/2011  *RADIOLOGY REPORT*  Clinical Data: Severe nausea and vomiting.  Abdominal pain.  CT ABDOMEN AND PELVIS WITHOUT CONTRAST  Technique:  Multidetector CT imaging of the abdomen and pelvis was performed following the standard protocol without intravenous contrast.  Comparison: 06/16/2011  Findings: Probable tiny calcified gallstones again noted, however there is no evidence of cholecystitis or biliary ductal dilatation. Noncontrast images of the liver, pancreas, spleen, adrenal glands, and kidneys are unremarkable in appearance.  Chronic bilateral perinephric stranding again demonstrated as well as  probable tiny fluid attenuation left renal cyst.  No evidence of renal calculi or hydronephrosis.  No soft tissue masses or lymphadenopathy identified within the abdomen or pelvis.  Uterus and adnexal regions are unremarkable. No evidence of inflammatory process or abnormal fluid collections. No evidence of dilated bowel loops or hernia.  Cardiomegaly and diffuse coronary artery calcifications again demonstrated, and pacemaker leads again noted in the right heart.  IMPRESSION:  1.  Stable exam.  No acute findings. 2.  Cholelithiasis, without evidence of acute cholecystitis.   Original Report Authenticated By: Myles Rosenthal, M.D.    Dg Chest 2 View  12/29/2011  *RADIOLOGY REPORT*  Clinical Data: Shortness of breath, weakness  CHEST - 2 VIEW  Comparison: None.  Findings: Cardiomegaly with mild interstitial edema, grossly unchanged.  Left lower lobe opacity, likely a combination of small  left pleural effusion and atelectasis.  Postsurgical changes related to prior CABG.  Left subclavian pacemaker.  Degenerative changes of the visualized thoracolumbar spine.  IMPRESSION: Cardiomegaly with mild interstitial edema, grossly unchanged.  Suspected small left pleural effusion with associated left lower lobe opacity, likely atelectasis.   Original Report Authenticated By: Charline Bills, M.D.    Dg Chest Port 1 View  12/24/2011  *RADIOLOGY REPORT*  Clinical Data: Pain with deep inspiration  PORTABLE CHEST - 1 VIEW  Comparison: 12/23/2011  Findings: Cardiomegaly again noted.  Status post CABG.  Dual lead cardiac pacemaker is unchanged in position.  Persistent mild interstitial edema bilaterally.  Stable bilateral basilar atelectasis or infiltrate.  IMPRESSION: Dual lead cardiac pacemaker is unchanged in position.  Persistent mild interstitial edema bilaterally.  Stable bilateral basilar atelectasis or infiltrate.   Original Report Authenticated By: Natasha Mead, M.D.    Dg Chest Port 1 View  12/23/2011  *RADIOLOGY REPORT*   Clinical Data: Shortness of breath.  PORTABLE CHEST - 1 VIEW  Comparison: Chest 12/17/2011.  Findings: There is new pulmonary edema.  Small left effusion and basilar atelectasis are also noted.  Heart size is upper normal. The patient is status post CABG with a pacing device in place.  IMPRESSION: New pulmonary edema and small left effusion.   Original Report Authenticated By: Holley Dexter, M.D.    Dg Chest Port 1 View  12/17/2011  *RADIOLOGY REPORT*  Clinical Data: Shortness of breath, pain, weakness, and vomiting  PORTABLE CHEST - 1 VIEW  Comparison: Chest radiograph 12/15/2011  Findings: Stable cardiomegaly in this patient status post median sternotomy for CABG.  Left chest wall dual lead pacer appears stable.  Lung volumes are low.  Stable elevation of the right hemidiaphragm.  The lungs appear clear.  No visible pleural effusion or pneumothorax. Visualized upper abdomen is unremarkable.  IMPRESSION: Stable chest radiograph.  No acute cardiopulmonary disease identified.   Original Report Authenticated By: Britta Mccreedy, M.D.    Dg Chest Port 1 View  12/15/2011  *RADIOLOGY REPORT*  Clinical Data: Fever.  PORTABLE CHEST - 1 VIEW  Comparison: 09/12/2011  Findings: Stable appearance of dual chamber pacemaker.  Lungs show no evidence of pulmonary edema or focal consolidation.  There is stable elevation of the right hemidiaphragm.  Heart size is stable status post prior CABG.  No significant pleural effusions are identified.  IMPRESSION: No active disease.   Original Report Authenticated By: Irish Lack, M.D.    Dg Foot Complete Right  12/17/2011  *RADIOLOGY REPORT*  Clinical Data: Right foot pain.  No known injury.  RIGHT FOOT COMPLETE - 3+ VIEW  Comparison: None.  Findings: Sock containing radiopaque material limits fine bone detail.  There is no evidence of acute fracture or dislocation.  No other significant bone abnormality identified.  Peripheral vascular calcification noted.  IMPRESSION: No  acute findings.   Original Report Authenticated By: Myles Rosenthal, M.D.    Note hgb from 12/26/11 was 7.2   Chest xray reviwed by me  MDM  Ventilation perfusion scan is pending to be checked by Dr.Goodrich Pretest clinical suspicion for pulmonary embolism low to moderate Plan admit to telemetry, intravenous antibiotics Diagnosis #1 urinary tract infection #2 dyspnea #3 rectal bleeding #4 anemia       Doug Sou, MD 12/29/11 1323

## 2011-12-29 NOTE — ED Notes (Signed)
Pt back from radiology 

## 2011-12-29 NOTE — Progress Notes (Signed)
[facility] ED CM spoke with [facility] DME staff member, Lucreticia about pt admission and need for assist with DME CM also contacted Darl Pikes, [facility] therapy coordinator to inform of admission

## 2011-12-29 NOTE — ED Notes (Signed)
Per EMS report:pt coming from home with c/o shortness of breath. Per EMS: pt was d/c'd from hospital last Thursday after receiving blood transfusion. Per EMS family wants pt to be reevaluated for same. Pt is lying in bed in NAD.

## 2011-12-29 NOTE — ED Notes (Signed)
ZOX:[id number]<AV> Expected date:<BR> Expected time:10:15 AM<BR> Means of arrival:Ambulance<BR> Comments:<BR> Shob, hot to touch, recent admission

## 2011-12-29 NOTE — Progress Notes (Signed)
CM spoke with Alona Bene, pt's daughter, at bedside prior to pt returning from nuclear to review cm consult, resumption of home services (OT/PT, RN, Aide) at d/c, evaluation by [facility] therapy on acute unit and contact made with [facility] in [facility] to follow pt for d/c needs Alona Bene states pt has a hospital bed but broken and a bedside commode but it was borrowed from her father, is ex large and pt did not receive DME of her own in last 3 years.  Provided with [facility] contact information Pt's choice is [facility] because is already an active pt in their service  HOME HEALTH AGENCIES SERVING [place]   Agencies that are Medicare-Certified and are affiliated with The [facility]  Telephone Number Address  [facility]   The [facility] has ownership interest in this company; however, you are under no obligation to use this agency. [phone] or  [phone] [address] [place], [place] [address] [email or web address]   Agencies that are Medicare-Certified and are not affiliated with The [facility] Telephone Number Address  [facility] [phone] Fax [phone] [address] [place], [place]  [address] [email or web address]  [facility] [phone] or [phone] Fax [phone] [place], [place] [address] [email or web address]  [facility] [phone] Fax [phone] [address] [place], [place] [address] [email or web address]  [facility] [phone] Fax [phone] [address] [place], [place]  [address] [email or web address]  [facility] [phone] Fax [phone] [address] [place], [place]  [address] [email or web address]  [facility] [phone] [address] [place], [place] [address] [email or web address]  [facility] [phone]  [address] [place], [place] [address] [email or web address]  [facility] [phone] or [phone] Fax number [phone] [place], [place]  [address] [email or web address]  [facility] [phone] Fax [phone] [address] [place], [place]  [address]  [facility]  [phone] Fax [phone] [address] [place], [place] [address] [email or web address]

## 2011-12-29 NOTE — H&P (Signed)
History and Physical  [name] NWG:[id number] DOB: 1920/12/20 DOA: 12/29/2011  Referring physician: Doug Sou, MD PCP: Willey Blade, MD   Chief Complaint: cough  HPI:  78 year old woman presented to ED today with cough, SOB and chest discomfort beginning this AM. Workup in ED notable for low-grade fever and UTI.  Patient hospitalized twice in last two weeks, first for acute renal failure, symptomatic anemia, UTI and then 12/1 to 12/10 for sepsis, UTI at which time she was diagnosed with a gastric ulcer, symptomatic hiatal hernia and was discharged on PPI, ASA, Plavix. SNF was recommended but refused and patient went [facility] with [facility].  Per patient and daughter at bedside patient report patient did well until this AM about 8 when she had severe reflux symptoms, acid in mouth (which she coughed on) and chest discomfort. Chest discomfort usually resolves with belch, but was unable to do so this morning. She became SOB with some wheeze and came to ED. In ED was noted to have a low grade fever but stable hemodynamics, UTI and stable blood work. D-dimer was positive but VQ was negative, EKG showed a paced rhythm. No cardiac enzymes were ordered.  Chart Review:  As above  Review of Systems:  Negative for fever/chills at home, sore throat, rash, new muscle aches,  dysuria, bleeding, n/v/abdominal pain.  Has been eating ok at home. Legally blind.  Past Medical History  Diagnosis Date  . Diabetes mellitus   . Hypertension   . MI (myocardial infarction)   . Coronary artery disease   . Legally blind     Past Surgical History  Procedure Date  . Pacemaker insertion   . Coronary artery bypass graft   . Cardiac catheterization   . Esophagogastroduodenoscopy 12/22/2011    Procedure: ESOPHAGOGASTRODUODENOSCOPY (EGD);  Surgeon: Iva Boop, MD;  Location: [facility];  Service: Endoscopy;  Laterality: N/A;    Social History:  reports that she has never smoked. She has never used  smokeless tobacco. She reports that she does not drink alcohol or use illicit drugs. daughter lives with here, she is essentially wheelchair bound. No memory loss or dementia.  Allergies  Allergen Reactions  . Darvon   . Other     Tylenol #3  . Penicillins Other (See Comments)    Was told had allergy from childhood... Unknown reaction  . Percocet (Oxycodone-Acetaminophen)   . Percodan (Oxycodone-Aspirin)   . Vicodin (Hydrocodone-Acetaminophen)     Family History  Problem Relation Age of Onset  . Breast cancer Daughter   . Diabetes Son   . Diabetes Daughter   . Heart disease Father   . Diabetes Father   . Colon polyps Daughter      Prior to Admission medications   Medication Sig Start Date End Date Taking? Authorizing Provider  Alum & Mag Hydroxide-Simeth (MAGIC MOUTHWASH) SOLN Take 5 mLs by mouth as needed. Swish and spit   Yes Historical Provider, MD  aspirin 81 MG chewable tablet Chew 81 mg by mouth daily.   Yes Historical Provider, MD  cilostazol (PLETAL) 100 MG tablet Take 100 mg by mouth 2 (two) times daily.   Yes Historical Provider, MD  clopidogrel (PLAVIX) 75 MG tablet Take 75 mg by mouth daily.   Yes Historical Provider, MD  feeding supplement (ENSURE COMPLETE) LIQD Take 237 mLs by mouth daily. 12/26/11  Yes Lizbeth Bark, FNP  gabapentin (NEURONTIN) 100 MG capsule Take 100 mg by mouth 2 (two) times daily.    Yes Historical  Provider, MD  levofloxacin (LEVAQUIN) 250 MG tablet Take 250 mg by mouth daily. 12/28/11 01/02/12 Yes Historical Provider, MD  levothyroxine (SYNTHROID, LEVOTHROID) 50 MCG tablet Take 50 mcg by mouth daily.   Yes Historical Provider, MD  loratadine (CLARITIN) 10 MG tablet Take 10 mg by mouth daily. For allergies   Yes Historical Provider, MD  metoprolol tartrate (LOPRESSOR) 25 MG tablet Take 0.5 tablets (12.5 mg total) by mouth 2 (two) times daily. 12/26/11  Yes Lizbeth Bark, FNP  pantoprazole (PROTONIX) 40 MG tablet Take 1 tablet (40 mg total) by mouth  2 (two) times daily before a meal. 12/26/11  Yes Lizbeth Bark, FNP  rosuvastatin (CRESTOR) 10 MG tablet Take 10 mg by mouth daily.   Yes Historical Provider, MD  Vitamin D, Ergocalciferol, (DRISDOL) 50000 UNITS CAPS Take 50,000 Units by mouth every 7 (seven) days. Mondays   Yes Historical Provider, MD  zolpidem (AMBIEN) 10 MG tablet Take 5 mg by mouth at bedtime as needed. sleep   Yes Historical Provider, MD   Physical Exam: Filed Vitals:   12/29/11 1019 12/29/11 1043 12/29/11 1148  BP:  133/80 133/85  Pulse:  111 104  Temp:  100.6 F (38.1 C)   TempSrc:  Rectal   Resp:   24  SpO2: 100% 94% 93%    General:  Appears calm and comfortable, examined in ED, lying flat and breathing with difficulty Eyes: pupils eccentric, normal lids ENT: mildly hard of hearing, normal lips, tongue Neck: no LAD, masses or thyromegaly Cardiovascular: RRR, no m/r/g. 1+ LE edema. Respiratory: CTA bilaterally, no w/r/r. Normal respiratory effort. Abdomen: soft, ntnd Skin: no rash or induration seen on limited exam Musculoskeletal: grossly normal tone BUE/BLE Psychiatric: grossly normal mood and affect, speech fluent and appropriate, oriented to self, hospital, month, year Neurologic: grossly non-focal.  Wt Readings from Last 3 Encounters:  12/26/11 97.523 kg (215 lb)  12/26/11 97.523 kg (215 lb)  12/17/11 96.2 kg (212 lb 1.3 oz)    Labs on Admission:  Basic Metabolic Panel:  Lab 12/29/11 9147 12/26/11 0534 12/24/11 0602  NA 136 136 135  K 3.7 3.4* 3.3*  CL 105 107 106  CO2 21 22 21   GLUCOSE 135* 110* 141*  BUN 4* 3* 5*  CREATININE 0.85 0.93 0.94  CALCIUM 9.0 8.0* 7.9*  MG -- -- --  PHOS -- -- --    Liver Function Tests:  Lab 12/29/11 1145  AST 14  ALT <5  ALKPHOS 111  BILITOT 0.6  PROT 6.2  ALBUMIN 2.0*   CBC:  Lab 12/29/11 1145 12/26/11 0534 12/25/11 0531 12/24/11 0602 12/23/11 0905  WBC 6.9 5.6 7.0 5.6 5.2  NEUTROABS 5.1 -- -- -- --  HGB 8.7* 7.2* 7.5* 7.4* 7.4*  HCT 27.2*  22.2* 22.7* 22.6* 22.6*  MCV 76.6* 77.1* 76.9* 76.9* 76.6*  PLT 223 200 219 196 180   CBG:  Lab 12/26/11 1723 12/26/11 1132 12/26/11 0757 12/25/11 2211 12/25/11 1643  GLUCAP 119* 114* 94 104* 116*    Radiological Exams on Admission: Dg Chest 2 View  12/29/2011  *RADIOLOGY REPORT*  Clinical Data: Shortness of breath, weakness  CHEST - 2 VIEW  Comparison: None.  Findings: Cardiomegaly with mild interstitial edema, grossly unchanged.  Left lower lobe opacity, likely a combination of small left pleural effusion and atelectasis.  Postsurgical changes related to prior CABG.  Left subclavian pacemaker.  Degenerative changes of the visualized thoracolumbar spine.  IMPRESSION: Cardiomegaly with mild interstitial edema, grossly unchanged.  Suspected small left pleural effusion with associated left lower lobe opacity, likely atelectasis.   Original Report Authenticated By: Charline Bills, M.D.    Nm Pulmonary Perf And Vent  12/29/2011  *RADIOLOGY REPORT*  Clinical Data:  Short of breath, concern pulmonary embolism  NUCLEAR MEDICINE VENTILATION - PERFUSION LUNG SCAN  Technique:  Ventilation images were obtained in multiple projections using inhaled aerosol technetium 99 M DTPA.  Perfusion images were obtained in multiple projections after intravenous injection of Tc-48m MAA.  Radiopharmaceuticals:  Tc-3m DTPA aerosol and 5.8 mCi Tc-35m MAA.  Comparison: Chest radiograph 12/29/2011  Findings:  Ventilation:   No focal ventilation defect.  Perfusion:   No wedge shaped peripheral perfusion defects to suggest acute pulmonary embolism.  Cardiomegaly noted.  IMPRESSION: Very low probability acute pulmonary embolism.   Original Report Authenticated By: Genevive Bi, M.D.     EKG: Independently reviewed. Electronic rhythm.   Principal Problem:  *UTI (lower urinary tract infection) Active Problems:  Microcytic anemia  Diabetes mellitus  Coronary artery disease, CABG in 1994 with last cath 2008  patent grafts and normal EF  Gastric ulcer  Chest pain  SOB (shortness of breath)  Hiatal hernia   Assessment/Plan 1. UTI--associated with low-grade fever. No definite sepsis. Culture sent. Empiric abx--Levaquin based on previous sensitivities.  2. Chest pain/SOB--based on history strongly suspect GI in origin. CXR negative. Will check cardiac enzymes but if negative would not pursue further evaluation. 3. GERD/gastric ulcer/hiatal hernia--BID PPI. 4. Microcytic anemia--stable. 5. DM--SSI, does not appear to be on any medications. 6. History of CAD--cardiology recommended continuing Plavix, ASA, Pletal on previous admission. 7. Legally blind  Code Status: Full code per patient Family Communication: discussed with daughter at bedside Disposition Plan/Anticipated LOS: 2-3 days  Time spent: 60 minutes  Brendia Sacks, MD  [facility] Team 6 Pager [phone] If 8PM-8AM, please contact night-coverage at [email or web address], password [facility] 12/29/2011, 4:18 PM

## 2011-12-30 DIAGNOSIS — R1013 Epigastric pain: Secondary | ICD-10-CM | POA: Diagnosis present

## 2011-12-30 DIAGNOSIS — R079 Chest pain, unspecified: Secondary | ICD-10-CM

## 2011-12-30 LAB — PREPARE RBC (CROSSMATCH)

## 2011-12-30 LAB — BASIC METABOLIC PANEL
CO2: 22 mEq/L (ref 19–32)
Calcium: 8 mg/dL — ABNORMAL LOW (ref 8.4–10.5)
Chloride: 104 mEq/L (ref 96–112)
Sodium: 134 mEq/L — ABNORMAL LOW (ref 135–145)

## 2011-12-30 LAB — URINE CULTURE
Colony Count: NO GROWTH
Culture: NO GROWTH

## 2011-12-30 LAB — CBC
MCH: 24.6 pg — ABNORMAL LOW (ref 26.0–34.0)
Platelets: 181 10*3/uL (ref 150–400)
RBC: 2.72 MIL/uL — ABNORMAL LOW (ref 3.87–5.11)
WBC: 5.2 10*3/uL (ref 4.0–10.5)

## 2011-12-30 LAB — GLUCOSE, CAPILLARY: Glucose-Capillary: 141 mg/dL — ABNORMAL HIGH (ref 70–99)

## 2011-12-30 MED ORDER — POTASSIUM CHLORIDE CRYS ER 20 MEQ PO TBCR
20.0000 meq | EXTENDED_RELEASE_TABLET | Freq: Two times a day (BID) | ORAL | Status: DC
  Administered 2011-12-30 – 2012-01-03 (×8): 20 meq via ORAL
  Filled 2011-12-30 (×9): qty 1

## 2011-12-30 MED ORDER — NITROGLYCERIN 0.2 MG/HR TD PT24
0.2000 mg | MEDICATED_PATCH | Freq: Every day | TRANSDERMAL | Status: DC
  Administered 2011-12-30 – 2012-01-03 (×5): 0.2 mg via TRANSDERMAL
  Filled 2011-12-30 (×6): qty 1

## 2011-12-30 NOTE — Progress Notes (Signed)
[facility] PROGRESS NOTE  [name] QIO:[id number] DOB: 1920-03-28 DOA: 12/29/2011 PCP: August Saucer [name], MD  Assessment/Plan:  Microcytic anemia: Pt has a history of gastric Ulcer recently diagnosed by endoscopy on 12/22/2011.  Upon discharge on 12/10 2013, pt had a Hgb of 7.2. At the time if admission, patient had a Hgb of 8.7 which was down to 6.7 this morning necessitating a transfusion. She is currently on Plavix, ASA and Pletal all of which I have discontinued.   UTI (lower urinary tract infection): U/C negative so will discontinue antibiotics  Chest pain: Pt continues to complain of chest pain and had been on low dose nitrates which were discontinued on last admission because of low BP. I will attempt low dose nitrates again and observe patient's response while she is hospitalized.   Coronary artery disease, CABG in 1994 with last cath 2008 patent grafts and normal EF: Seen by Cardiology on last admission and had ECHO performed which showed normal EF and no signs of Diastolic dysfunction.   Diabetes mellitus: BS adequate. Diet liberalized to regular to allow for adequate caloric intake.   Gastric ulcer:  Biopsy results showed atropic gastric. Pt is continued on Protonix.    SOB (shortness of breath): Etiology unclear but patient likely has a component of interstitial pulmonary edema. However her BP is marginal and likely unable to tolerate any diuresis.  Goals of Care: Goals of care from Palliative care meeting on last admission are noted however I think that they need to be revisited in the best interest of this patient. At this time it appears that even conservative medical interventions have adverse effects and offer no  Improvement in the overall quality of this patient's life. Will speak with GI and Cardiology and discuss further with patient and family.  Code Status: Full Code Family Communication: Daughter T bedside and updated Disposition Plan: Home at time of  discharge  MATTHEWS,MICHELLE A.  [facility] Pager [phone]. If 7PM-7AM, please contact night-coverage. 12/30/2011, 7:04 PM  LOS: 1 day   Brief narrative: 78 year old woman presented to ED today with cough, SOB and chest discomfort beginning this AM. Workup in ED notable for low-grade fever and UTI.  Patient hospitalized twice in last two weeks, first for acute renal failure, symptomatic anemia, UTI and then 12/1 to 12/10 for sepsis, UTI at which time she was diagnosed with a gastric ulcer, symptomatic hiatal hernia and was discharged on PPI, ASA, Plavix. SNF was recommended but refused and patient went [facility] with [facility].  Per patient and daughter at bedside patient report patient did well until this AM about 8 when she had severe reflux symptoms, acid in mouth (which she coughed on) and chest discomfort. Chest discomfort usually resolves with belch, but was unable to do so this morning. She became SOB with some wheeze and came to ED. In ED was noted to have a low grade fever but stable hemodynamics, UTI and stable blood work. D-dimer was positive but VQ was negative, EKG showed a paced rhythm. No cardiac enzymes were ordered.   Consultants:  None  Procedures:  NONE  Antibiotics:  Levaquin 12/13 >>12/14  HPI/Subjective: Pt awake and alert. Her greatest complaint seems to be SOB and dyspepsia.  Objective: Filed Vitals:   12/30/11 1115 12/30/11 1215 12/30/11 1230 12/30/11 1452  BP: 114/56 108/63 107/79 117/54  Pulse: 92 93 87 69  Temp: 98.1 F (36.7 C) 98.1 F (36.7 C) 98.1 F (36.7 C) 98.5 F (36.9 C)  TempSrc: Oral Oral  Oral Oral  Resp: 18 18 18 18   Height:      Weight:      SpO2: 98%   96%   Weight change:   Intake/Output Summary (Last 24 hours) at 12/30/11 1904 Last data filed at 12/30/11 1230  Gross per 24 hour  Intake    552 ml  Output      0 ml  Net    552 ml    General: Alert, awake, oriented x3, in no acute distress.  HEENT: [place]/AT PEERL, EOMI Heart:  Regular rate and rhythm, without murmurs, rubs, gallops. Paced rhythm on telemetry.  Lungs: Clear to auscultation, no wheezing or rhonchi noted. Mild bi-basilar crackles  Abdomen: Soft, obese, nontender, nondistended, positive bowel sounds, no masses no hepatosplenomegaly noted.  Neuro: No focal neurological deficits noted. Musculoskeletal: No warm swelling or erythema around joints, no spinal tenderness noted. Psychiatric: Patient alert and oriented x3, good insight and cognition, good recent to remote recall.    Data Reviewed: Basic Metabolic Panel:  Lab 12/30/11 7829 12/29/11 1145 12/26/11 0534 12/24/11 0602  NA 134* 136 136 135  K 3.5 3.7 3.4* 3.3*  CL 104 105 107 106  CO2 22 21 22 21   GLUCOSE 122* 135* 110* 141*  BUN 5* 4* 3* 5*  CREATININE 0.88 0.85 0.93 0.94  CALCIUM 8.0* 9.0 8.0* 7.9*  MG -- -- -- --  PHOS -- -- -- --   Liver Function Tests:  Lab 12/29/11 1145  AST 14  ALT <5  ALKPHOS 111  BILITOT 0.6  PROT 6.2  ALBUMIN 2.0*   No results found for this basename: LIPASE:5,AMYLASE:5 in the last 168 hours No results found for this basename: AMMONIA:5 in the last 168 hours CBC:  Lab 12/30/11 0542 12/29/11 1145 12/26/11 0534 12/25/11 0531 12/24/11 0602  WBC 5.2 6.9 5.6 7.0 5.6  NEUTROABS -- 5.1 -- -- --  HGB 6.7* 8.7* 7.2* 7.5* 7.4*  HCT 21.1* 27.2* 22.2* 22.7* 22.6*  MCV 77.6* 76.6* 77.1* 76.9* 76.9*  PLT 181 223 200 219 196   Cardiac Enzymes:  Lab 12/30/11 0020 12/29/11 1813  CKTOTAL -- --  CKMB -- --  CKMBINDEX -- --  TROPONINI <0.30 <0.30   BNP (last 3 results)  Basename 12/17/11 1603 12/15/11 0650 11/06/11 1400  PROBNP 2026.0* 1637.0* 2645.0*   CBG:  Lab 12/30/11 1706 12/30/11 1238 12/30/11 0811 12/29/11 2046 12/29/11 1744  GLUCAP 141* 128* 110* 142* 160*    Recent Results (from the past 240 hour(s))  CLOSTRIDIUM DIFFICILE BY PCR     Status: Normal   Collection Time   12/24/11  7:55 AM      Component Value Range Status Comment   C difficile  by pcr NEGATIVE  NEGATIVE Final   URINE CULTURE     Status: Normal   Collection Time   12/29/11 10:49 AM      Component Value Range Status Comment   Specimen Description URINE, CATHETERIZED   Final    Special Requests Immunocompromised   Final    Culture  Setup Time 12/29/2011 18:29   Final    Colony Count NO GROWTH   Final    Culture NO GROWTH   Final    Report Status 12/30/2011 FINAL   Final      Studies: Ct Abdomen Pelvis Wo Contrast  12/17/2011  *RADIOLOGY REPORT*  Clinical Data: Severe nausea and vomiting.  Abdominal pain.  CT ABDOMEN AND PELVIS WITHOUT CONTRAST  Technique:  Multidetector CT imaging of the  abdomen and pelvis was performed following the standard protocol without intravenous contrast.  Comparison: 06/16/2011  Findings: Probable tiny calcified gallstones again noted, however there is no evidence of cholecystitis or biliary ductal dilatation. Noncontrast images of the liver, pancreas, spleen, adrenal glands, and kidneys are unremarkable in appearance.  Chronic bilateral perinephric stranding again demonstrated as well as probable tiny fluid attenuation left renal cyst.  No evidence of renal calculi or hydronephrosis.  No soft tissue masses or lymphadenopathy identified within the abdomen or pelvis.  Uterus and adnexal regions are unremarkable. No evidence of inflammatory process or abnormal fluid collections. No evidence of dilated bowel loops or hernia.  Cardiomegaly and diffuse coronary artery calcifications again demonstrated, and pacemaker leads again noted in the right heart.  IMPRESSION:  1.  Stable exam.  No acute findings. 2.  Cholelithiasis, without evidence of acute cholecystitis.   Original Report Authenticated By: Myles Rosenthal, M.D.    Dg Chest 2 View  12/29/2011  *RADIOLOGY REPORT*  Clinical Data: Shortness of breath, weakness  CHEST - 2 VIEW  Comparison: None.  Findings: Cardiomegaly with mild interstitial edema, grossly unchanged.  Left lower lobe opacity, likely a  combination of small left pleural effusion and atelectasis.  Postsurgical changes related to prior CABG.  Left subclavian pacemaker.  Degenerative changes of the visualized thoracolumbar spine.  IMPRESSION: Cardiomegaly with mild interstitial edema, grossly unchanged.  Suspected small left pleural effusion with associated left lower lobe opacity, likely atelectasis.   Original Report Authenticated By: Charline Bills, M.D.    Nm Pulmonary Perf And Vent  12/29/2011  *RADIOLOGY REPORT*  Clinical Data:  Short of breath, concern pulmonary embolism  NUCLEAR MEDICINE VENTILATION - PERFUSION LUNG SCAN  Technique:  Ventilation images were obtained in multiple projections using inhaled aerosol technetium 99 M DTPA.  Perfusion images were obtained in multiple projections after intravenous injection of Tc-75m MAA.  Radiopharmaceuticals:  Tc-69m DTPA aerosol and 5.8 mCi Tc-80m MAA.  Comparison: Chest radiograph 12/29/2011  Findings:  Ventilation:   No focal ventilation defect.  Perfusion:   No wedge shaped peripheral perfusion defects to suggest acute pulmonary embolism.  Cardiomegaly noted.  IMPRESSION: Very low probability acute pulmonary embolism.   Original Report Authenticated By: Genevive Bi, M.D.    Dg Chest Port 1 View  12/24/2011  *RADIOLOGY REPORT*  Clinical Data: Pain with deep inspiration  PORTABLE CHEST - 1 VIEW  Comparison: 12/23/2011  Findings: Cardiomegaly again noted.  Status post CABG.  Dual lead cardiac pacemaker is unchanged in position.  Persistent mild interstitial edema bilaterally.  Stable bilateral basilar atelectasis or infiltrate.  IMPRESSION: Dual lead cardiac pacemaker is unchanged in position.  Persistent mild interstitial edema bilaterally.  Stable bilateral basilar atelectasis or infiltrate.   Original Report Authenticated By: Natasha Mead, M.D.    Dg Chest Port 1 View  12/23/2011  *RADIOLOGY REPORT*  Clinical Data: Shortness of breath.  PORTABLE CHEST - 1 VIEW  Comparison:  Chest 12/17/2011.  Findings: There is new pulmonary edema.  Small left effusion and basilar atelectasis are also noted.  Heart size is upper normal. The patient is status post CABG with a pacing device in place.  IMPRESSION: New pulmonary edema and small left effusion.   Original Report Authenticated By: Holley Dexter, M.D.    Dg Chest Port 1 View  12/17/2011  *RADIOLOGY REPORT*  Clinical Data: Shortness of breath, pain, weakness, and vomiting  PORTABLE CHEST - 1 VIEW  Comparison: Chest radiograph 12/15/2011  Findings: Stable cardiomegaly in this patient  status post median sternotomy for CABG.  Left chest wall dual lead pacer appears stable.  Lung volumes are low.  Stable elevation of the right hemidiaphragm.  The lungs appear clear.  No visible pleural effusion or pneumothorax. Visualized upper abdomen is unremarkable.  IMPRESSION: Stable chest radiograph.  No acute cardiopulmonary disease identified.   Original Report Authenticated By: Britta Mccreedy, M.D.    Dg Chest Port 1 View  12/15/2011  *RADIOLOGY REPORT*  Clinical Data: Fever.  PORTABLE CHEST - 1 VIEW  Comparison: 09/12/2011  Findings: Stable appearance of dual chamber pacemaker.  Lungs show no evidence of pulmonary edema or focal consolidation.  There is stable elevation of the right hemidiaphragm.  Heart size is stable status post prior CABG.  No significant pleural effusions are identified.  IMPRESSION: No active disease.   Original Report Authenticated By: Irish Lack, M.D.    Dg Foot Complete Right  12/17/2011  *RADIOLOGY REPORT*  Clinical Data: Right foot pain.  No known injury.  RIGHT FOOT COMPLETE - 3+ VIEW  Comparison: None.  Findings: Sock containing radiopaque material limits fine bone detail.  There is no evidence of acute fracture or dislocation.  No other significant bone abnormality identified.  Peripheral vascular calcification noted.  IMPRESSION: No acute findings.   Original Report Authenticated By: Myles Rosenthal, M.D.      Scheduled Meds:   . atorvastatin  20 mg Oral q1800  . feeding supplement  237 mL Oral Q24H  . gabapentin  100 mg Oral BID  . insulin aspart  0-9 Units Subcutaneous TID WC  . levofloxacin (LEVAQUIN) IV  500 mg Intravenous Q24H  . levothyroxine  50 mcg Oral QAC breakfast  . loratadine  10 mg Oral Daily  . metoprolol tartrate  12.5 mg Oral BID  . pantoprazole  40 mg Oral BID AC  . sodium chloride  3 mL Intravenous Q12H  . sodium chloride  3 mL Intravenous Q12H   Continuous Infusions:   Principal Problem:  *UTI (lower urinary tract infection) Active Problems:  Microcytic anemia  Diabetes mellitus  Coronary artery disease, CABG in 1994 with last cath 2008 patent grafts and normal EF  Gastric ulcer  Chest pain  SOB (shortness of breath)  Hiatal hernia

## 2011-12-30 NOTE — Progress Notes (Signed)
CRITICAL VALUE ALERT  Critical value received:  hgb- 6.7  Date of notification:  12/30/2011    Time of notification:  6:30 AM   Critical value read back:yes  Nurse who received alert:  J.Cheryle Dark  MD notified (1st page):  [phone]  Time of first page:  [phone]  MD notified (2nd page):  Time of second page:  Responding MD: Claiborne Billings, NP  Time MD responded:  [phone]

## 2011-12-31 DIAGNOSIS — E8770 Fluid overload, unspecified: Secondary | ICD-10-CM | POA: Diagnosis present

## 2011-12-31 DIAGNOSIS — E8779 Other fluid overload: Secondary | ICD-10-CM

## 2011-12-31 DIAGNOSIS — R0602 Shortness of breath: Secondary | ICD-10-CM

## 2011-12-31 LAB — CBC WITH DIFFERENTIAL/PLATELET
Eosinophils Absolute: 0.1 10*3/uL (ref 0.0–0.7)
Eosinophils Relative: 2 % (ref 0–5)
HCT: 25.2 % — ABNORMAL LOW (ref 36.0–46.0)
Lymphocytes Relative: 22 % (ref 12–46)
Lymphs Abs: 1.1 10*3/uL (ref 0.7–4.0)
MCH: 24.8 pg — ABNORMAL LOW (ref 26.0–34.0)
MCV: 77.1 fL — ABNORMAL LOW (ref 78.0–100.0)
Monocytes Absolute: 0.6 10*3/uL (ref 0.1–1.0)
Platelets: 182 10*3/uL (ref 150–400)
RBC: 3.27 MIL/uL — ABNORMAL LOW (ref 3.87–5.11)
RDW: 19.3 % — ABNORMAL HIGH (ref 11.5–15.5)

## 2011-12-31 LAB — OCCULT BLOOD X 1 CARD TO LAB, STOOL: Fecal Occult Bld: NEGATIVE

## 2011-12-31 LAB — TYPE AND SCREEN
Antibody Screen: NEGATIVE
Unit division: 0

## 2011-12-31 LAB — GLUCOSE, CAPILLARY: Glucose-Capillary: 98 mg/dL (ref 70–99)

## 2011-12-31 LAB — MAGNESIUM: Magnesium: 1.8 mg/dL (ref 1.5–2.5)

## 2011-12-31 MED ORDER — FUROSEMIDE 10 MG/ML IJ SOLN
40.0000 mg | Freq: Once | INTRAMUSCULAR | Status: AC
  Administered 2011-12-31: 40 mg via INTRAVENOUS

## 2011-12-31 NOTE — Progress Notes (Signed)
[facility] PROGRESS NOTE  [name] YNW:[id number] DOB: 08-Jun-1920 DOA: 12/29/2011 PCP: August Saucer [name], MD  Assessment/Plan: Fluid Overload: PT is grossly fluid overloaded. I will give a dose of lasix and assess response. If BP tolerates and effective urine output, will continue with lasix per daily assessment as pt at risk for renal compromise. Check daily weights and strict intake and output.   Microcytic anemia: Pt has a history of gastric Ulcer recently diagnosed by endoscopy on 12/22/2011.  Upon discharge on 12/10 2013, pt had a Hgb of 7.2. At the time if admission, patient had a Hgb of 8.7 which was down to 6.7 this morning necessitating a transfusion. She is currently on Plavix, ASA and Pletal all of which I have discontinued. I am awaiting opinion from Cardiology about continuing antiplatelet therapy.  Chest pain: Pt continues to complain of chest pain and had been on low dose nitrates which were discontinued on last admission because of low BP. Pt was started on low dose nitrates yesterday and seems to be tolerating them well so far.   UTI (lower urinary tract infection): U/C negative so will discontinue antibiotics  .Coronary artery disease, CABG in 1994 with last cath 2008 patent grafts and normal EF: Seen by Cardiology on last admission and had ECHO performed which showed normal EF and no signs of Diastolic dysfunction.   Diabetes mellitus: BS adequate. Diet liberalized to regular to allow for adequate caloric intake.   Gastric ulcer:  Biopsy results showed atropic gastric. Pt is continued on Protonix.    SOB (shortness of breath): Secondary to fluid overload. Will give gentle diuresis.  Goals of Care: Goals of care from Palliative care meeting on last admission are noted however I think that they need to be revisited in the best interest of this patient. I have had a long discussion with patient's granddaughter and have offered to meet with all stakeholders (in the next  48 hours) in the care of this patient as there seems to be a poor understanding of patient's overall state of health and the appropriate options for this patient.  Code Status: Full Code Family Communication: Granddaughter  bedside and updated Disposition Plan: Home at time of discharge  Saverio Kader A.  [facility] Pager [phone]. If 7PM-7AM, please contact night-coverage. 12/31/2011, 6:43 PM  LOS: 2 days   Brief narrative: 78 year old woman presented to ED today with cough, SOB and chest discomfort beginning this AM. Workup in ED notable for low-grade fever and UTI.  Patient hospitalized twice in last two weeks, first for acute renal failure, symptomatic anemia, UTI and then 12/1 to 12/10 for sepsis, UTI at which time she was diagnosed with a gastric ulcer, symptomatic hiatal hernia and was discharged on PPI, ASA, Plavix. SNF was recommended but refused and patient went home with [facility].  Per patient and daughter at bedside patient report patient did well until this AM about 8 when she had severe reflux symptoms, acid in mouth (which she coughed on) and chest discomfort. Chest discomfort usually resolves with belch, but was unable to do so this morning. She became SOB with some wheeze and came to ED. In ED was noted to have a low grade fever but stable hemodynamics, UTI and stable blood work. D-dimer was positive but VQ was negative, EKG showed a paced rhythm. No cardiac enzymes were ordered.   Consultants:  None  Procedures:  NONE  Antibiotics:  Levaquin 12/13 >>12/14  HPI/Subjective: Pt awake and alert. Her greatest complaint seems to  be SOB and dyspepsia.  Objective: Filed Vitals:   12/30/11 1230 12/30/11 1452 12/30/11 2119 12/31/11 0542  BP: 107/79 117/54 121/68 124/59  Pulse: 87 69 90 88  Temp: 98.1 F (36.7 C) 98.5 F (36.9 C) 98.9 F (37.2 C) 98.5 F (36.9 C)  TempSrc: Oral Oral Oral Oral  Resp: 18 18 18 18   Height:      Weight:      SpO2:  96% 92% 96%    Weight change:  No intake or output data in the 24 hours ending 12/31/11 1843  General: Alert, awake, oriented x3, in no acute distress.  HEENT: [place]/AT PEERL, EOMI. Pt has some mild periorbital edema Heart: Regular rate and rhythm, without murmurs, rubs, gallops. Paced rhythm on telemetry.  Lungs: Clear to auscultation, no wheezing or rhonchi noted. Mild to moderate bi-basilar crackles  Abdomen: Soft, obese, nontender, nondistended, positive bowel sounds, no masses no hepatosplenomegaly noted.  Neuro: No focal neurological deficits noted. Musculoskeletal: 2+ pedal edema.  No erythema around joints, no spinal tenderness noted. Psychiatric: Patient alert and oriented x3, good insight and cognition, good recent to remote recall.    Data Reviewed: Basic Metabolic Panel:  Lab 12/31/11 8119 12/30/11 0542 12/29/11 1145 12/26/11 0534  NA -- 134* 136 136  K -- 3.5 3.7 3.4*  CL -- 104 105 107  CO2 -- 22 21 22   GLUCOSE -- 122* 135* 110*  BUN -- 5* 4* 3*  CREATININE -- 0.88 0.85 0.93  CALCIUM -- 8.0* 9.0 8.0*  MG 1.8 -- -- --  PHOS -- -- -- --   Liver Function Tests:  Lab 12/29/11 1145  AST 14  ALT <5  ALKPHOS 111  BILITOT 0.6  PROT 6.2  ALBUMIN 2.0*   No results found for this basename: LIPASE:5,AMYLASE:5 in the last 168 hours No results found for this basename: AMMONIA:5 in the last 168 hours CBC:  Lab 12/31/11 0525 12/30/11 0542 12/29/11 1145 12/26/11 0534 12/25/11 0531  WBC 5.0 5.2 6.9 5.6 7.0  NEUTROABS 3.2 -- 5.1 -- --  HGB 8.1* 6.7* 8.7* 7.2* 7.5*  HCT 25.2* 21.1* 27.2* 22.2* 22.7*  MCV 77.1* 77.6* 76.6* 77.1* 76.9*  PLT 182 181 223 200 219   Cardiac Enzymes:  Lab 12/30/11 0020 12/29/11 1813  CKTOTAL -- --  CKMB -- --  CKMBINDEX -- --  TROPONINI <0.30 <0.30   BNP (last 3 results)  Basename 12/17/11 1603 12/15/11 0650 11/06/11 1400  PROBNP 2026.0* 1637.0* 2645.0*   CBG:  Lab 12/31/11 1205 12/31/11 0736 12/30/11 2119 12/30/11 1706 12/30/11 1238  GLUCAP  119* 98 121* 141* 128*    Recent Results (from the past 240 hour(s))  CLOSTRIDIUM DIFFICILE BY PCR     Status: Normal   Collection Time   12/24/11  7:55 AM      Component Value Range Status Comment   C difficile by pcr NEGATIVE  NEGATIVE Final   URINE CULTURE     Status: Normal   Collection Time   12/29/11 10:49 AM      Component Value Range Status Comment   Specimen Description URINE, CATHETERIZED   Final    Special Requests Immunocompromised   Final    Culture  Setup Time 12/29/2011 18:29   Final    Colony Count NO GROWTH   Final    Culture NO GROWTH   Final    Report Status 12/30/2011 FINAL   Final      Studies: Ct Abdomen Pelvis Wo Contrast  12/17/2011  *  RADIOLOGY REPORT*  Clinical Data: Severe nausea and vomiting.  Abdominal pain.  CT ABDOMEN AND PELVIS WITHOUT CONTRAST  Technique:  Multidetector CT imaging of the abdomen and pelvis was performed following the standard protocol without intravenous contrast.  Comparison: 06/16/2011  Findings: Probable tiny calcified gallstones again noted, however there is no evidence of cholecystitis or biliary ductal dilatation. Noncontrast images of the liver, pancreas, spleen, adrenal glands, and kidneys are unremarkable in appearance.  Chronic bilateral perinephric stranding again demonstrated as well as probable tiny fluid attenuation left renal cyst.  No evidence of renal calculi or hydronephrosis.  No soft tissue masses or lymphadenopathy identified within the abdomen or pelvis.  Uterus and adnexal regions are unremarkable. No evidence of inflammatory process or abnormal fluid collections. No evidence of dilated bowel loops or hernia.  Cardiomegaly and diffuse coronary artery calcifications again demonstrated, and pacemaker leads again noted in the right heart.  IMPRESSION:  1.  Stable exam.  No acute findings. 2.  Cholelithiasis, without evidence of acute cholecystitis.   Original Report Authenticated By: Myles Rosenthal, M.D.    Dg Chest 2  View  12/29/2011  *RADIOLOGY REPORT*  Clinical Data: Shortness of breath, weakness  CHEST - 2 VIEW  Comparison: None.  Findings: Cardiomegaly with mild interstitial edema, grossly unchanged.  Left lower lobe opacity, likely a combination of small left pleural effusion and atelectasis.  Postsurgical changes related to prior CABG.  Left subclavian pacemaker.  Degenerative changes of the visualized thoracolumbar spine.  IMPRESSION: Cardiomegaly with mild interstitial edema, grossly unchanged.  Suspected small left pleural effusion with associated left lower lobe opacity, likely atelectasis.   Original Report Authenticated By: Charline Bills, M.D.    Nm Pulmonary Perf And Vent  12/29/2011  *RADIOLOGY REPORT*  Clinical Data:  Short of breath, concern pulmonary embolism  NUCLEAR MEDICINE VENTILATION - PERFUSION LUNG SCAN  Technique:  Ventilation images were obtained in multiple projections using inhaled aerosol technetium 99 M DTPA.  Perfusion images were obtained in multiple projections after intravenous injection of Tc-21m MAA.  Radiopharmaceuticals:  Tc-78m DTPA aerosol and 5.8 mCi Tc-31m MAA.  Comparison: Chest radiograph 12/29/2011  Findings:  Ventilation:   No focal ventilation defect.  Perfusion:   No wedge shaped peripheral perfusion defects to suggest acute pulmonary embolism.  Cardiomegaly noted.  IMPRESSION: Very low probability acute pulmonary embolism.   Original Report Authenticated By: Genevive Bi, M.D.    Dg Chest Port 1 View  12/24/2011  *RADIOLOGY REPORT*  Clinical Data: Pain with deep inspiration  PORTABLE CHEST - 1 VIEW  Comparison: 12/23/2011  Findings: Cardiomegaly again noted.  Status post CABG.  Dual lead cardiac pacemaker is unchanged in position.  Persistent mild interstitial edema bilaterally.  Stable bilateral basilar atelectasis or infiltrate.  IMPRESSION: Dual lead cardiac pacemaker is unchanged in position.  Persistent mild interstitial edema bilaterally.  Stable  bilateral basilar atelectasis or infiltrate.   Original Report Authenticated By: Natasha Mead, M.D.    Dg Chest Port 1 View  12/23/2011  *RADIOLOGY REPORT*  Clinical Data: Shortness of breath.  PORTABLE CHEST - 1 VIEW  Comparison: Chest 12/17/2011.  Findings: There is new pulmonary edema.  Small left effusion and basilar atelectasis are also noted.  Heart size is upper normal. The patient is status post CABG with a pacing device in place.  IMPRESSION: New pulmonary edema and small left effusion.   Original Report Authenticated By: Holley Dexter, M.D.    Dg Chest Port 1 View  12/17/2011  *RADIOLOGY REPORT*  Clinical Data: Shortness of breath, pain, weakness, and vomiting  PORTABLE CHEST - 1 VIEW  Comparison: Chest radiograph 12/15/2011  Findings: Stable cardiomegaly in this patient status post median sternotomy for CABG.  Left chest wall dual lead pacer appears stable.  Lung volumes are low.  Stable elevation of the right hemidiaphragm.  The lungs appear clear.  No visible pleural effusion or pneumothorax. Visualized upper abdomen is unremarkable.  IMPRESSION: Stable chest radiograph.  No acute cardiopulmonary disease identified.   Original Report Authenticated By: Britta Mccreedy, M.D.    Dg Chest Port 1 View  12/15/2011  *RADIOLOGY REPORT*  Clinical Data: Fever.  PORTABLE CHEST - 1 VIEW  Comparison: 09/12/2011  Findings: Stable appearance of dual chamber pacemaker.  Lungs show no evidence of pulmonary edema or focal consolidation.  There is stable elevation of the right hemidiaphragm.  Heart size is stable status post prior CABG.  No significant pleural effusions are identified.  IMPRESSION: No active disease.   Original Report Authenticated By: Irish Lack, M.D.    Dg Foot Complete Right  12/17/2011  *RADIOLOGY REPORT*  Clinical Data: Right foot pain.  No known injury.  RIGHT FOOT COMPLETE - 3+ VIEW  Comparison: None.  Findings: Sock containing radiopaque material limits fine bone detail.  There is no  evidence of acute fracture or dislocation.  No other significant bone abnormality identified.  Peripheral vascular calcification noted.  IMPRESSION: No acute findings.   Original Report Authenticated By: Myles Rosenthal, M.D.     Scheduled Meds:    . atorvastatin  20 mg Oral q1800  . feeding supplement  237 mL Oral Q24H  . gabapentin  100 mg Oral BID  . insulin aspart  0-9 Units Subcutaneous TID WC  . levothyroxine  50 mcg Oral QAC breakfast  . loratadine  10 mg Oral Daily  . metoprolol tartrate  12.5 mg Oral BID  . nitroGLYCERIN  0.2 mg Transdermal Daily  . pantoprazole  40 mg Oral BID AC  . potassium chloride  20 mEq Oral BID  . sodium chloride  3 mL Intravenous Q12H  . sodium chloride  3 mL Intravenous Q12H   Continuous Infusions:   Principal Problem:  *Chest pain Active Problems:  Microcytic anemia  Diabetes mellitus  Coronary artery disease, CABG in 1994 with last cath 2008 patent grafts and normal EF  Gastric ulcer  UTI (lower urinary tract infection)  SOB (shortness of breath)  Hiatal hernia  Dyspepsia

## 2011-12-31 NOTE — Consult Note (Addendum)
[facility]       CONSULTATION NOTE   Reason for Consult: Antiplatelet/anticoagulant therapy management  Requesting Physician: Ashley Royalty  Cardiologist: Tresa Endo  HPI: This is a 78 y.o. female with a past medical history significant for CAD, without recent ACS or PCI. She is admitted the third time in just a few weeks for severe anemia, likely due to GI bleeding. Her Hgb has increased from 6.7 on admission to 8.1 following transfusion. No overt melena (in fact she is severely constipated). Angina is not a complaint recently. She does have dyspnea and considerable pedal edema. On her most recent admission, EGD showed a small non-bleeding gastric ulcer.  She underwent bypass surgery in 1994. Last cardiac catheterization was in 2008 that showed her grafts to be patent and her LV function to be normal. In 2008 she developed complete heart block, which again, prompted a cath that showed grafts to be patent (patent SVG to PDA, SVG to Dx2, LIMA to LAD, and SVG to OM had 70 - 75% stenosis). Due to high grade AV block, a permanent pacemaker was placed at that time ([employer]). She has had paroxysmal AF, as far as I can tell it has been brief and asymptomatic. She does not have a history of CVA/TIA/embolic events. Her most recent pacemaker interrogation (2 weeks ago) showed only one episode of PAF, lasting just over one minute and occurring in August.  Echo 12/19/2011 confirms normal systolic function.   PMHx:  Past Medical History  Diagnosis Date  . Diabetes mellitus   . Hypertension   . MI (myocardial infarction)   . Coronary artery disease   . Legally blind    Past Surgical History  Procedure Date  . Pacemaker insertion   . Coronary artery bypass graft   . Cardiac catheterization   . Esophagogastroduodenoscopy 12/22/2011    Procedure: ESOPHAGOGASTRODUODENOSCOPY (EGD);  Surgeon: Iva Boop, MD;  Location: [facility];  Service: Endoscopy;  Laterality: N/A;     FAMHx: Family History  Problem Relation Age of Onset  . Breast cancer Daughter   . Diabetes Son   . Diabetes Daughter   . Heart disease Father   . Diabetes Father   . Colon polyps Daughter     SOCHx:  reports that she has never smoked. She has never used smokeless tobacco. She reports that she does not drink alcohol or use illicit drugs.  ALLERGIES: Allergies  Allergen Reactions  . Darvon   . Other     Tylenol #3  . Penicillins Other (See Comments)    Was told had allergy from childhood... Unknown reaction  . Percocet (Oxycodone-Acetaminophen)   . Percodan (Oxycodone-Aspirin)   . Vicodin (Hydrocodone-Acetaminophen)     ROS: Constitutional: positive for fatigue, negative for chills, fevers and sweats Eyes: positive for legal blindness Ears, nose, mouth, throat, and face: negative for epistaxis and sore throat Respiratory: positive for dyspnea on exertion, negative for cough, hemoptysis and sputum Cardiovascular: positive for dyspnea, fatigue and lower extremity edema, negative for chest pain, claudication, near-syncope, orthopnea, paroxysmal nocturnal dyspnea and syncope Gastrointestinal: positive for constipation, dyspepsia and reflux symptoms, negative for diarrhea, jaundice, nausea and vomiting Genitourinary:negative Integument/breast: negative for rash and skin lesion(s) Hematologic/lymphatic: negative for bleeding and easy bruising Musculoskeletal:positive for arthralgias, back pain and stiff joints Neurological: positive for gait problems, negative for headaches, memory problems, seizures and vertigo Behavioral/Psych: negative for depression  HOME MEDICATIONS: Prescriptions prior to admission  Medication Sig Dispense Refill  .  Alum & Mag Hydroxide-Simeth (MAGIC MOUTHWASH) SOLN Take 5 mLs by mouth as needed. Swish and spit      . aspirin 81 MG chewable tablet Chew 81 mg by mouth daily.      . cilostazol (PLETAL) 100 MG tablet Take 100 mg by mouth 2 (two) times  daily.      . clopidogrel (PLAVIX) 75 MG tablet Take 75 mg by mouth daily.      . feeding supplement (ENSURE COMPLETE) LIQD Take 237 mLs by mouth daily.      Marland Kitchen gabapentin (NEURONTIN) 100 MG capsule Take 100 mg by mouth 2 (two) times daily.       Marland Kitchen levofloxacin (LEVAQUIN) 250 MG tablet Take 250 mg by mouth daily.      Marland Kitchen levothyroxine (SYNTHROID, LEVOTHROID) 50 MCG tablet Take 50 mcg by mouth daily.      Marland Kitchen loratadine (CLARITIN) 10 MG tablet Take 10 mg by mouth daily. For allergies      . metoprolol tartrate (LOPRESSOR) 25 MG tablet Take 0.5 tablets (12.5 mg total) by mouth 2 (two) times daily.  30 tablet  0  . pantoprazole (PROTONIX) 40 MG tablet Take 1 tablet (40 mg total) by mouth 2 (two) times daily before a meal.  60 tablet  1  . rosuvastatin (CRESTOR) 10 MG tablet Take 10 mg by mouth daily.      . Vitamin D, Ergocalciferol, (DRISDOL) 50000 UNITS CAPS Take 50,000 Units by mouth every 7 (seven) days. Mondays      . zolpidem (AMBIEN) 10 MG tablet Take 5 mg by mouth at bedtime as needed. sleep        HOSPITAL MEDICATIONS: Prior to Admission:  Prescriptions prior to admission  Medication Sig Dispense Refill  . Alum & Mag Hydroxide-Simeth (MAGIC MOUTHWASH) SOLN Take 5 mLs by mouth as needed. Swish and spit      . aspirin 81 MG chewable tablet Chew 81 mg by mouth daily.      . cilostazol (PLETAL) 100 MG tablet Take 100 mg by mouth 2 (two) times daily.      . clopidogrel (PLAVIX) 75 MG tablet Take 75 mg by mouth daily.      . feeding supplement (ENSURE COMPLETE) LIQD Take 237 mLs by mouth daily.      Marland Kitchen gabapentin (NEURONTIN) 100 MG capsule Take 100 mg by mouth 2 (two) times daily.       Marland Kitchen levofloxacin (LEVAQUIN) 250 MG tablet Take 250 mg by mouth daily.      Marland Kitchen levothyroxine (SYNTHROID, LEVOTHROID) 50 MCG tablet Take 50 mcg by mouth daily.      Marland Kitchen loratadine (CLARITIN) 10 MG tablet Take 10 mg by mouth daily. For allergies      . metoprolol tartrate (LOPRESSOR) 25 MG tablet Take 0.5 tablets (12.5  mg total) by mouth 2 (two) times daily.  30 tablet  0  . pantoprazole (PROTONIX) 40 MG tablet Take 1 tablet (40 mg total) by mouth 2 (two) times daily before a meal.  60 tablet  1  . rosuvastatin (CRESTOR) 10 MG tablet Take 10 mg by mouth daily.      . Vitamin D, Ergocalciferol, (DRISDOL) 50000 UNITS CAPS Take 50,000 Units by mouth every 7 (seven) days. Mondays      . zolpidem (AMBIEN) 10 MG tablet Take 5 mg by mouth at bedtime as needed. sleep        VITALS: Blood pressure 124/59, pulse 88, temperature 98.5 F (36.9 C), temperature source Oral, resp.  rate 18, height 5\' 5"  (1.651 m), weight 97.523 kg (215 lb), SpO2 96.00%.  PHYSICAL EXAM: General appearance: alert, cooperative, no distress and pale Neck: JVD - hard to see due to obesity cm above sternal notch, no adenopathy, no carotid bruit, supple, symmetrical, trachea midline and thyroid not enlarged, symmetric, no tenderness/mass/nodules Lungs: clear to auscultation bilaterally Heart: regularly irregular rhythm, S1: normal and S2: wide splitting Abdomen: normal findings: normal bowel sounds, no peritoneal signs and abnormal findings:  distended Extremities: edema 3+ pedal edema despite SCDs Pulses: 2+ and symmetric in upper extremities Skin: Skin color, texture, turgor normal. No rashes or lesions Neurologic: Grossly normal except blindness  LABS: Results for orders placed during the hospital encounter of 12/29/11 (from the past 48 hour(s))  D-DIMER, QUANTITATIVE     Status: Abnormal   Collection Time   12/29/11 11:58 AM      Component Value Range Comment   D-Dimer, Quant 2.14 (*) 0.00 - 0.48 ug/mL-FEU   GLUCOSE, CAPILLARY     Status: Abnormal   Collection Time   12/29/11  5:44 PM      Component Value Range Comment   Glucose-Capillary 160 (*) 70 - 99 mg/dL   TROPONIN I     Status: Normal   Collection Time   12/29/11  6:13 PM      Component Value Range Comment   Troponin I <0.30  <0.30 ng/mL   GLUCOSE, CAPILLARY     Status:  Abnormal   Collection Time   12/29/11  8:46 PM      Component Value Range Comment   Glucose-Capillary 142 (*) 70 - 99 mg/dL    Comment 1 Documented in Chart      Comment 2 Notify RN     TROPONIN I     Status: Normal   Collection Time   12/30/11 12:20 AM      Component Value Range Comment   Troponin I <0.30  <0.30 ng/mL   CBC     Status: Abnormal   Collection Time   12/30/11  5:42 AM      Component Value Range Comment   WBC 5.2  4.0 - 10.5 K/uL    RBC 2.72 (*) 3.87 - 5.11 MIL/uL    Hemoglobin 6.7 (*) 12.0 - 15.0 g/dL    HCT 16.1 (*) 09.6 - 46.0 %    MCV 77.6 (*) 78.0 - 100.0 fL    MCH 24.6 (*) 26.0 - 34.0 pg    MCHC 31.8  30.0 - 36.0 g/dL    RDW 04.5 (*) 40.9 - 15.5 %    Platelets 181  150 - 400 K/uL   BASIC METABOLIC PANEL     Status: Abnormal   Collection Time   12/30/11  5:42 AM      Component Value Range Comment   Sodium 134 (*) 135 - 145 mEq/L    Potassium 3.5  3.5 - 5.1 mEq/L    Chloride 104  96 - 112 mEq/L    CO2 22  19 - 32 mEq/L    Glucose, Bld 122 (*) 70 - 99 mg/dL    BUN 5 (*) 6 - 23 mg/dL    Creatinine, Ser 8.11  0.50 - 1.10 mg/dL    Calcium 8.0 (*) 8.4 - 10.5 mg/dL    GFR calc non Af Amer 57 (*) >90 mL/min    GFR calc Af Amer 66 (*) >90 mL/min   PREPARE RBC (CROSSMATCH)     Status: Normal   Collection  Time   12/30/11  7:30 AM      Component Value Range Comment   Order Confirmation ORDER PROCESSED BY BLOOD BANK     GLUCOSE, CAPILLARY     Status: Abnormal   Collection Time   12/30/11  8:11 AM      Component Value Range Comment   Glucose-Capillary 110 (*) 70 - 99 mg/dL   TYPE AND SCREEN     Status: Normal   Collection Time   12/30/11  8:19 AM      Component Value Range Comment   ABO/RH(D) O POS      Antibody Screen NEG      Sample Expiration 01/02/2012      Unit Number [id number]      Blood Component Type RED CELLS,LR      Unit division 00      Status of Unit ISSUED,FINAL      Transfusion Status OK TO TRANSFUSE      Crossmatch Result Compatible      GLUCOSE, CAPILLARY     Status: Abnormal   Collection Time   12/30/11 12:38 PM      Component Value Range Comment   Glucose-Capillary 128 (*) 70 - 99 mg/dL   GLUCOSE, CAPILLARY     Status: Abnormal   Collection Time   12/30/11  5:06 PM      Component Value Range Comment   Glucose-Capillary 141 (*) 70 - 99 mg/dL   GLUCOSE, CAPILLARY     Status: Abnormal   Collection Time   12/30/11  9:19 PM      Component Value Range Comment   Glucose-Capillary 121 (*) 70 - 99 mg/dL   CBC WITH DIFFERENTIAL     Status: Abnormal   Collection Time   12/31/11  5:25 AM      Component Value Range Comment   WBC 5.0  4.0 - 10.5 K/uL    RBC 3.27 (*) 3.87 - 5.11 MIL/uL    Hemoglobin 8.1 (*) 12.0 - 15.0 g/dL    HCT 65.7 (*) 84.6 - 46.0 %    MCV 77.1 (*) 78.0 - 100.0 fL    MCH 24.8 (*) 26.0 - 34.0 pg    MCHC 32.1  30.0 - 36.0 g/dL    RDW 96.2 (*) 95.2 - 15.5 %    Platelets 182  150 - 400 K/uL    Neutrophils Relative 63  43 - 77 %    Neutro Abs 3.2  1.7 - 7.7 K/uL    Lymphocytes Relative 22  12 - 46 %    Lymphs Abs 1.1  0.7 - 4.0 K/uL    Monocytes Relative 12  3 - 12 %    Monocytes Absolute 0.6  0.1 - 1.0 K/uL    Eosinophils Relative 2  0 - 5 %    Eosinophils Absolute 0.1  0.0 - 0.7 K/uL    Basophils Relative 0  0 - 1 %    Basophils Absolute 0.0  0.0 - 0.1 K/uL   MAGNESIUM     Status: Normal   Collection Time   12/31/11  5:25 AM      Component Value Range Comment   Magnesium 1.8  1.5 - 2.5 mg/dL   GLUCOSE, CAPILLARY     Status: Normal   Collection Time   12/31/11  7:36 AM      Component Value Range Comment   Glucose-Capillary 98  70 - 99 mg/dL     IMAGING: Nm Pulmonary Perf And Vent  12/29/2011  *RADIOLOGY REPORT*  Clinical Data:  Short of breath, concern pulmonary embolism  NUCLEAR MEDICINE VENTILATION - PERFUSION LUNG SCAN  Technique:  Ventilation images were obtained in multiple projections using inhaled aerosol technetium 99 M DTPA.  Perfusion images were obtained in multiple  projections after intravenous injection of Tc-58m MAA.  Radiopharmaceuticals:  Tc-11m DTPA aerosol and 5.8 mCi Tc-74m MAA.  Comparison: Chest radiograph 12/29/2011  Findings:  Ventilation:   No focal ventilation defect.  Perfusion:   No wedge shaped peripheral perfusion defects to suggest acute pulmonary embolism.  Cardiomegaly noted.  IMPRESSION: Very low probability acute pulmonary embolism.   Original Report Authenticated By: Genevive Bi, M.D.     IMPRESSION: 1. Recurrent severe anemia, likely due to occult GI hemorrhage 2. Brief paroxysmal AF without history of embolic CVA or major valvular abnormalities; no PAF since August 3. Diastolic heart failure, currently hypervolemic due to transfusion; just started on diuretics 4. Extensive CAD, patent grafts, no recent ACS or revascularization and currently angina free even in face of severe anemia 5. PAD - she is essentially wheelchair/bed bound and Pletal cannot be expected to offer much benefit 6. CKD , but currently excellent creat at 0.88 7. DM 8. HTN, controlled  RECOMMENDATION: 1. Overall the risk of bleeding and the risk of complications from repeated blood transfusions outweigh the risk of acute coronary event or embolic CVA, at least in the short term. I recommend stopping the clopidogrel and the cilostazol permanently. If her Hgb remains steady, I would consider restarting ASA in a dose of only 81 mg daily.  2. Agree with diuretics. Her outpatient "dry weight" is around 198 lb per her report, but is likely even lower now after 3 hospitalizations. She is grossly hypervolemic, but diuresis should be performed gradually to avoid recurrence of acute worsening of chronic renal failure. Not sure what pro BNP is at "baseline".  Time Spent Directly with Patient: 45 minutes  Thurmon Fair, MD, [facility] [phone] office [phone] pager  Ayren Zumbro 12/31/2011, 11:47 AM

## 2012-01-01 DIAGNOSIS — E8779 Other fluid overload: Secondary | ICD-10-CM

## 2012-01-01 DIAGNOSIS — E46 Unspecified protein-calorie malnutrition: Secondary | ICD-10-CM | POA: Insufficient documentation

## 2012-01-01 DIAGNOSIS — R079 Chest pain, unspecified: Secondary | ICD-10-CM

## 2012-01-01 LAB — BASIC METABOLIC PANEL
BUN: 7 mg/dL (ref 6–23)
Chloride: 103 mEq/L (ref 96–112)
GFR calc Af Amer: 63 mL/min — ABNORMAL LOW (ref >90)
GFR calc non Af Amer: 54 mL/min — ABNORMAL LOW (ref >90)
Potassium: 4.4 mEq/L (ref 3.5–5.1)
Sodium: 136 mEq/L (ref 135–145)

## 2012-01-01 LAB — MAGNESIUM: Magnesium: 1.8 mg/dL (ref 1.5–2.5)

## 2012-01-01 LAB — GLUCOSE, CAPILLARY
Glucose-Capillary: 124 mg/dL — ABNORMAL HIGH (ref 70–99)
Glucose-Capillary: 94 mg/dL (ref 70–99)

## 2012-01-01 MED ORDER — FUROSEMIDE 80 MG PO TABS
80.0000 mg | ORAL_TABLET | Freq: Once | ORAL | Status: AC
  Administered 2012-01-01: 80 mg via ORAL
  Filled 2012-01-01: qty 1

## 2012-01-01 NOTE — Progress Notes (Signed)
SICKLE CELL SERVICE PROGRESS NOTE  [name]:[id number] DOB: [birth year] DOA: 12/29/2011 PCP: Willey Blade, MD  Assessment/Plan: Fluid Overload: Pt responded well to diuresis yesterday and has had a significant weight loss (12#) since 12/29/2011.  Will give another dose of Lasix today.   Microcytic anemia: Pt has a history of gastric Ulcer recently diagnosed by endoscopy on 12/22/2011.  Upon discharge on 12/10 2013, pt had a Hgb of 7.2. At the time if admission, patient had a Hgb of 8.7 which was down to 6.7 this morning necessitating a transfusion. She is currently on Plavix, ASA and Pletal all of which I have discontinued.Cardiology agrees with discontinuing antiplatelet therapy.  Chest pain: Pt states that chest pain improved on low dose nitrates.  Will continue.   UTI (lower urinary tract infection): U/C negative so antibiotics discontinued   Coronary artery disease, CABG in 1994 with last cath 2008 patent grafts and normal EF: Seen by Cardiology on last admission and had ECHO performed which showed normal EF and no signs of Diastolic dysfunction.   Diabetes mellitus: BS adequate. Diet liberalized to regular to allow for adequate caloric intake. BS WNL   Gastric ulcer:  Biopsy results showed atropic gastric. Pt is continued on Protonix.    SOB (shortness of breath): Secondary to fluid overload. Improved with diuresis.  Goals of Care: Goals of care from Palliative care meeting on last admission are noted however I think that they need to be revisited in the best interest of this patient. I have had a long discussion with patient's children today and presented information regarding pt's overall condition. Specifically: malnutrition and low protein state, Chest Pain- cardiac and GI source, Anemia. I've also delineated the role of ACLS resuscitation in [name]'s case and the potential outcomes.  I've recommended against full code status. Pt and family still want to proceed with  Full Code.  Code Status: Full Code Family Communication: Children updated. See above. Disposition Plan: Home at time of discharge  [name] A.  [facility] Pager [phone]. If 7PM-7AM, please contact night-coverage. 01/01/2012, 6:47 PM  LOS: 3 days   Brief narrative: 78 year old woman presented to ED today with cough, SOB and chest discomfort beginning this AM. Workup in ED notable for low-grade fever and UTI.  Patient hospitalized twice in last two weeks, first for acute renal failure, symptomatic anemia, UTI and then 12/1 to 12/10 for sepsis, UTI at which time she was diagnosed with a gastric ulcer, symptomatic hiatal hernia and was discharged on PPI, ASA, Plavix. SNF was recommended but refused and patient went [facility] with [facility].  Per patient and daughter at bedside patient report patient did well until this AM about 8 when she had severe reflux symptoms, acid in mouth (which she coughed on) and chest discomfort. Chest discomfort usually resolves with belch, but was unable to do so this morning. She became SOB with some wheeze and came to ED. In ED was noted to have a low grade fever but stable hemodynamics, UTI and stable blood work. D-dimer was positive but VQ was negative, EKG showed a paced rhythm. No cardiac enzymes were ordered.   Consultants:  None  Procedures:  NONE  Antibiotics:  Levaquin 12/13 >>12/14  HPI/Subjective: Pt awake and alert. States that her SOB is improved.  Objective: Filed Vitals:   12/31/11 2057 01/01/12 0500 01/01/12 0637 01/01/12 1427  BP: 149/81  122/64 102/43  Pulse: 99  85 84  Temp: 97.5 F (36.4 C)  98.2 F (36.8 C)  97.3 F (36.3 C)  TempSrc: Oral  Oral Oral  Resp: 18  18 18   Height:      Weight:  92.443 kg (203 lb 12.8 oz) 92.443 kg (203 lb 12.8 oz)   SpO2: 97%  97% 95%   Weight change:   Intake/Output Summary (Last 24 hours) at 01/01/12 1847 Last data filed at 01/01/12 6578  Gross per 24 hour  Intake    120 ml  Output       0 ml  Net    120 ml    General: Alert, awake, oriented x3, in no acute distress.  HEENT: Tiger/AT PEERL, EOMI. Pt has some mild periorbital edema Heart: Regular rate and rhythm, without murmurs, rubs, gallops. Paced rhythm on telemetry.  Lungs: Clear to auscultation, no wheezing or rhonchi noted except  mild  bi-basilar crackles  Abdomen: Soft, obese, nontender, nondistended, positive bowel sounds, no masses no hepatosplenomegaly noted.  Neuro: No focal neurological deficits noted. Musculoskeletal: 2+ pedal edema.  No erythema around joints, no spinal tenderness noted. Psychiatric: Patient alert and oriented x3, good insight and cognition, good recent to remote recall.    Data Reviewed: Basic Metabolic Panel:  Lab 01/01/12 4696 12/31/11 0525 12/30/11 0542 12/29/11 1145 12/26/11 0534  NA 136 -- 134* 136 136  K 4.4 -- 3.5 3.7 3.4*  CL 103 -- 104 105 107  CO2 26 -- 22 21 22   GLUCOSE 154* -- 122* 135* 110*  BUN 7 -- 5* 4* 3*  CREATININE 0.91 -- 0.88 0.85 0.93  CALCIUM 8.4 -- 8.0* 9.0 8.0*  MG 1.8 1.8 -- -- --  PHOS -- -- -- -- --   Liver Function Tests:  Lab 12/29/11 1145  AST 14  ALT <5  ALKPHOS 111  BILITOT 0.6  PROT 6.2  ALBUMIN 2.0*   No results found for this basename: LIPASE:5,AMYLASE:5 in the last 168 hours No results found for this basename: AMMONIA:5 in the last 168 hours CBC:  Lab 12/31/11 0525 12/30/11 0542 12/29/11 1145 12/26/11 0534  WBC 5.0 5.2 6.9 5.6  NEUTROABS 3.2 -- 5.1 --  HGB 8.1* 6.7* 8.7* 7.2*  HCT 25.2* 21.1* 27.2* 22.2*  MCV 77.1* 77.6* 76.6* 77.1*  PLT 182 181 223 200   Cardiac Enzymes:  Lab 12/30/11 0020 12/29/11 1813  CKTOTAL -- --  CKMB -- --  CKMBINDEX -- --  TROPONINI <0.30 <0.30   BNP (last 3 results)  Basename 12/17/11 1603 12/15/11 0650 11/06/11 1400  PROBNP 2026.0* 1637.0* 2645.0*   CBG:  Lab 01/01/12 1718 01/01/12 1220 01/01/12 0747 12/31/11 2055 12/31/11 1205  GLUCAP 94 89 124* 135* 119*    Recent Results (from  the past 240 hour(s))  CLOSTRIDIUM DIFFICILE BY PCR     Status: Normal   Collection Time   12/24/11  7:55 AM      Component Value Range Status Comment   C difficile by pcr NEGATIVE  NEGATIVE Final   URINE CULTURE     Status: Normal   Collection Time   12/29/11 10:49 AM      Component Value Range Status Comment   Specimen Description URINE, CATHETERIZED   Final    Special Requests Immunocompromised   Final    Culture  Setup Time 12/29/2011 18:29   Final    Colony Count NO GROWTH   Final    Culture NO GROWTH   Final    Report Status 12/30/2011 FINAL   Final      Studies: Ct Abdomen Pelvis  Wo Contrast  12/17/2011  *RADIOLOGY REPORT*  Clinical Data: Severe nausea and vomiting.  Abdominal pain.  CT ABDOMEN AND PELVIS WITHOUT CONTRAST  Technique:  Multidetector CT imaging of the abdomen and pelvis was performed following the standard protocol without intravenous contrast.  Comparison: 06/16/2011  Findings: Probable tiny calcified gallstones again noted, however there is no evidence of cholecystitis or biliary ductal dilatation. Noncontrast images of the liver, pancreas, spleen, adrenal glands, and kidneys are unremarkable in appearance.  Chronic bilateral perinephric stranding again demonstrated as well as probable tiny fluid attenuation left renal cyst.  No evidence of renal calculi or hydronephrosis.  No soft tissue masses or lymphadenopathy identified within the abdomen or pelvis.  Uterus and adnexal regions are unremarkable. No evidence of inflammatory process or abnormal fluid collections. No evidence of dilated bowel loops or hernia.  Cardiomegaly and diffuse coronary artery calcifications again demonstrated, and pacemaker leads again noted in the right heart.  IMPRESSION:  1.  Stable exam.  No acute findings. 2.  Cholelithiasis, without evidence of acute cholecystitis.   Original Report Authenticated By: Myles Rosenthal, M.D.    Dg Chest 2 View  12/29/2011  *RADIOLOGY REPORT*  Clinical Data:  Shortness of breath, weakness  CHEST - 2 VIEW  Comparison: None.  Findings: Cardiomegaly with mild interstitial edema, grossly unchanged.  Left lower lobe opacity, likely a combination of small left pleural effusion and atelectasis.  Postsurgical changes related to prior CABG.  Left subclavian pacemaker.  Degenerative changes of the visualized thoracolumbar spine.  IMPRESSION: Cardiomegaly with mild interstitial edema, grossly unchanged.  Suspected small left pleural effusion with associated left lower lobe opacity, likely atelectasis.   Original Report Authenticated By: Charline Bills, M.D.    Nm Pulmonary Perf And Vent  12/29/2011  *RADIOLOGY REPORT*  Clinical Data:  Short of breath, concern pulmonary embolism  NUCLEAR MEDICINE VENTILATION - PERFUSION LUNG SCAN  Technique:  Ventilation images were obtained in multiple projections using inhaled aerosol technetium 99 M DTPA.  Perfusion images were obtained in multiple projections after intravenous injection of Tc-37m MAA.  Radiopharmaceuticals:  Tc-28m DTPA aerosol and 5.8 mCi Tc-72m MAA.  Comparison: Chest radiograph 12/29/2011  Findings:  Ventilation:   No focal ventilation defect.  Perfusion:   No wedge shaped peripheral perfusion defects to suggest acute pulmonary embolism.  Cardiomegaly noted.  IMPRESSION: Very low probability acute pulmonary embolism.   Original Report Authenticated By: Genevive Bi, M.D.    Dg Chest Port 1 View  12/24/2011  *RADIOLOGY REPORT*  Clinical Data: Pain with deep inspiration  PORTABLE CHEST - 1 VIEW  Comparison: 12/23/2011  Findings: Cardiomegaly again noted.  Status post CABG.  Dual lead cardiac pacemaker is unchanged in position.  Persistent mild interstitial edema bilaterally.  Stable bilateral basilar atelectasis or infiltrate.  IMPRESSION: Dual lead cardiac pacemaker is unchanged in position.  Persistent mild interstitial edema bilaterally.  Stable bilateral basilar atelectasis or infiltrate.   Original Report  Authenticated By: Natasha Mead, M.D.    Dg Chest Port 1 View  12/23/2011  *RADIOLOGY REPORT*  Clinical Data: Shortness of breath.  PORTABLE CHEST - 1 VIEW  Comparison: Chest 12/17/2011.  Findings: There is new pulmonary edema.  Small left effusion and basilar atelectasis are also noted.  Heart size is upper normal. The patient is status post CABG with a pacing device in place.  IMPRESSION: New pulmonary edema and small left effusion.   Original Report Authenticated By: Holley Dexter, M.D.    Dg Chest [facility]  12/17/2011  *RADIOLOGY REPORT*  Clinical Data: Shortness of breath, pain, weakness, and vomiting  PORTABLE CHEST - 1 VIEW  Comparison: Chest radiograph 12/15/2011  Findings: Stable cardiomegaly in this patient status post median sternotomy for CABG.  Left chest wall dual lead pacer appears stable.  Lung volumes are low.  Stable elevation of the right hemidiaphragm.  The lungs appear clear.  No visible pleural effusion or pneumothorax. Visualized upper abdomen is unremarkable.  IMPRESSION: Stable chest radiograph.  No acute cardiopulmonary disease identified.   Original Report Authenticated By: Britta Mccreedy, M.D.    Dg Chest Port 1 View  12/15/2011  *RADIOLOGY REPORT*  Clinical Data: Fever.  PORTABLE CHEST - 1 VIEW  Comparison: 09/12/2011  Findings: Stable appearance of dual chamber pacemaker.  Lungs show no evidence of pulmonary edema or focal consolidation.  There is stable elevation of the right hemidiaphragm.  Heart size is stable status post prior CABG.  No significant pleural effusions are identified.  IMPRESSION: No active disease.   Original Report Authenticated By: Irish Lack, M.D.    Dg Foot Complete Right  12/17/2011  *RADIOLOGY REPORT*  Clinical Data: Right foot pain.  No known injury.  RIGHT FOOT COMPLETE - 3+ VIEW  Comparison: None.  Findings: Sock containing radiopaque material limits fine bone detail.  There is no evidence of acute fracture or dislocation.  No other  significant bone abnormality identified.  Peripheral vascular calcification noted.  IMPRESSION: No acute findings.   Original Report Authenticated By: Myles Rosenthal, M.D.     Scheduled Meds:    . atorvastatin  20 mg Oral q1800  . feeding supplement  237 mL Oral Q24H  . gabapentin  100 mg Oral BID  . insulin aspart  0-9 Units Subcutaneous TID WC  . levothyroxine  50 mcg Oral QAC breakfast  . loratadine  10 mg Oral Daily  . metoprolol tartrate  12.5 mg Oral BID  . nitroGLYCERIN  0.2 mg Transdermal Daily  . pantoprazole  40 mg Oral BID AC  . potassium chloride  20 mEq Oral BID  . sodium chloride  3 mL Intravenous Q12H  . sodium chloride  3 mL Intravenous Q12H   Continuous Infusions:   Principal Problem:  *Fluid overload Active Problems:  Microcytic anemia  Diabetes mellitus  Coronary artery disease, CABG in 1994 with last cath 2008 patent grafts and normal EF  Gastric ulcer  Chest pain  SOB (shortness of breath)  Hiatal hernia  Dyspepsia

## 2012-01-01 NOTE — Progress Notes (Signed)
I have discussed the patient & plan of care with Mr. [name] -- still some dyspnea & CP.  Agree with continued diuresis -- will need to use clinical exam & proBNP/Cr along with wgt to determine if & when she is ready to convert to PO.  Lasix not ordered for AM => would at least use PO Lasix standing with prn IV.  Ok to check AML & proBNP to determine timing.  BP is borderline low on only NTG patch.  H/H with appropriate increase post-transfusion, still low.  May still need additional PRBC -- would dose lasix after each unit.  All anticoagulation / antiplatelet agents stopped.  Would hope to re-start ASA low dose on d/c.  Marykay Lex, M.D., M.S. [facility] [address] [place], [place]  [address]  [phone] Pager # [phone] 01/01/2012 6:14 PM

## 2012-01-01 NOTE — Progress Notes (Signed)
[facility]  Subjective: Some dyspnea and chest pain.   Objective: Vital signs in last 24 hours: Temp:  [97.3 F (36.3 C)-98.2 F (36.8 C)] 97.3 F (36.3 C) (12/16 1427) Pulse Rate:  [84-99] 84  (12/16 1427) Resp:  [18] 18  (12/16 1427) BP: (102-149)/(43-81) 102/43 mmHg (12/16 1427) SpO2:  [95 %-97 %] 95 % (12/16 1427) Weight:  [92.443 kg (203 lb 12.8 oz)] 92.443 kg (203 lb 12.8 oz) (12/16 0637) Last BM Date: 12/26/11  Intake/Output from previous day: 12/15 0701 - 12/16 0700 In: 120 [I.V.:120] Out: -  Intake/Output this shift:    Medications Current Facility-Administered Medications  Medication Dose Route Frequency Provider Last Rate Last Dose  . 0.9 %  sodium chloride infusion  250 mL Intravenous PRN Standley Brooking, MD      . alum & mag hydroxide-simeth (MAALOX/MYLANTA) 200-200-20 MG/5ML suspension 30 mL  30 mL Oral Q6H PRN Standley Brooking, MD   30 mL at 12/31/11 [phone]  . atorvastatin (LIPITOR) tablet 20 mg  20 mg Oral q1800 Standley Brooking, MD   20 mg at 12/31/11 1739  . feeding supplement (ENSURE COMPLETE) liquid 237 mL  237 mL Oral Q24H Standley Brooking, MD   237 mL at 12/31/11 1739  . furosemide (LASIX) tablet 80 mg  80 mg Oral Once Altha Harm, MD      . gabapentin (NEURONTIN) capsule 100 mg  100 mg Oral BID Standley Brooking, MD   100 mg at 01/01/12 1029  . insulin aspart (novoLOG) injection 0-9 Units  0-9 Units Subcutaneous TID WC Standley Brooking, MD   1 Units at 01/01/12 1029  . levothyroxine (SYNTHROID, LEVOTHROID) tablet 50 mcg  50 mcg Oral QAC breakfast Standley Brooking, MD   50 mcg at 01/01/12 0851  . loratadine (CLARITIN) tablet 10 mg  10 mg Oral Daily Standley Brooking, MD   10 mg at 01/01/12 1029  . metoprolol tartrate (LOPRESSOR) tablet 12.5 mg  12.5 mg Oral BID Standley Brooking, MD   12.5 mg at 01/01/12 1029  . nitroGLYCERIN (NITRODUR - Dosed in mg/24 hr) patch 0.2 mg  0.2 mg Transdermal Daily Altha Harm, MD   0.2 mg at 01/01/12 1030  . ondansetron (ZOFRAN) tablet 4 mg  4 mg Oral Q6H PRN Standley Brooking, MD       Or  . ondansetron [facility]) injection 4 mg  4 mg Intravenous Q6H PRN Standley Brooking, MD      . pantoprazole (PROTONIX) EC tablet 40 mg  40 mg Oral BID AC Standley Brooking, MD   40 mg at 01/01/12 0851  . potassium chloride SA (K-DUR,KLOR-CON) CR tablet 20 mEq  20 mEq Oral BID Altha Harm, MD   20 mEq at 01/01/12 1029  . sodium chloride 0.9 % injection 3 mL  3 mL Intravenous Q12H Standley Brooking, MD   3 mL at 12/31/11 2208  . sodium chloride 0.9 % injection 3 mL  3 mL Intravenous Q12H Standley Brooking, MD   3 mL at 12/31/11 2208  . sodium chloride 0.9 % injection 3 mL  3 mL Intravenous PRN Standley Brooking, MD      . zolpidem [facility]) tablet 2.5 mg  2.5 mg Oral QHS PRN Rolan Lipa, NP   2.5 mg at 12/31/11 2208    PE: General appearance: alert, cooperative and no distress Neck: no JVD Lungs: Bilateral rales. Heart:  regular rate and rhythm and No MM Extremities: Trace pedal edema Pulses: 2+ and symmetric Skin: Warm and dry. Neurologic: Grossly normal  Lab Results:   Basename 12/31/11 0525 12/30/11 0542  WBC 5.0 5.2  HGB 8.1* 6.7*  HCT 25.2* 21.1*  PLT 182 181   BMET  Basename 01/01/12 0435 12/30/11 0542  NA 136 134*  K 4.4 3.5  CL 103 104  CO2 26 22  GLUCOSE 154* 122*  BUN 7 5*  CREATININE 0.91 0.88  CALCIUM 8.4 8.0*     Assessment/Plan  Principal Problem:  *Chest pain Active Problems:  Microcytic anemia  Diabetes mellitus  Coronary artery disease, CABG in 1994 with last cath 2008 patent grafts and normal EF  Gastric ulcer  UTI (lower urinary tract infection)  SOB (shortness of breath)  Hiatal hernia  Dyspepsia  Fluid overload  Plan:  Good response to diuretics.  A lot of unmeasured urinary output due to incontinence.   S/P one unit PRBCs on 12/14.   No Hgb today.  Ordering now.  No MI.  Chest pain may be related to  demand ischemia.  Agree with continued Lasix.  BP and HR stable.   Will check BNP.   LOS: 3 days    [name] 01/01/2012 5:15 PM

## 2012-01-01 NOTE — Progress Notes (Signed)
PIV site, swollen, hard to touch. IV RN paged, seen patient unable to reinsert PIV. MD made aware and said it is ok   not to have PIV for now.

## 2012-01-01 NOTE — Care Management Note (Unsigned)
    Page 1 of 2   01/02/2012     12:46:28 PM   CARE MANAGEMENT NOTE 01/02/2012  Patient:  MALIHA, OUTTEN   Account Number:  [id number]  Date Initiated:  01/01/2012  Documentation initiated by:  Lanier Clam  Subjective/Objective Assessment:   ADMITTED W/CHEST PAIN.HX:CAD,MI.     Action/Plan:   FROM HOME W/FAMILY.   Anticipated DC Date:  01/03/2012   Anticipated DC Plan:  HOME W HOME HEALTH SERVICES      DC Planning Services  CM consult      [facility] Choice  Resumption Of Svcs/PTA Provider   Choice offered to / List presented to:  C-4 Adult Children   DME arranged  HOSPITAL BED  BEDSIDE COMMODE      DME agency  [facility]     HH arranged  HH-1 RN  HH-2 PT  HH-3 OT  HH-4 NURSE'S AIDE      HH agency  [facility]   Status of service:  In process, will continue to follow Medicare Important Message given?   (If response is "NO", the following Medicare IM given date fields will be blank) Date Medicare IM given:   Date Additional Medicare IM given:    Discharge Disposition:    Per UR Regulation:  Reviewed for med. necessity/level of care/duration of stay  If discussed at Long Length of Stay Meetings, dates discussed:    Comments:  01/02/12 Harlie Buening RN,BSN NCM [phone] [facility] SUSAN([facility]) FOLLOWING FOR RESUMPTION OF HH,SINCE ALREADY ACTIVE.[facility] DME REP-LUCRETIA INFORMED & FOLLOWING FOR 3N1,NEW HOSPITAL BED.COORDINATION OF DME W/PATIENT'S DTR JOYCE C#[phone] BY [facility] DME REP FOR DELIVERY OF DME TOMORROW.[facility] AWARE OF HH ORDERS.AMBULANCE TRANSP NEEDED @ D/C.  01/01/12 Tayvia Faughnan RN,BSN NCM [phone] [facility] FOLLOWING ON  HOME DME NEEDS-3N1,NEW HOSPITAL BED.

## 2012-01-02 DIAGNOSIS — E46 Unspecified protein-calorie malnutrition: Secondary | ICD-10-CM

## 2012-01-02 LAB — CBC WITH DIFFERENTIAL/PLATELET
Eosinophils Absolute: 0.1 10*3/uL (ref 0.0–0.7)
Hemoglobin: 8.5 g/dL — ABNORMAL LOW (ref 12.0–15.0)
Lymphs Abs: 1.2 10*3/uL (ref 0.7–4.0)
MCH: 24.9 pg — ABNORMAL LOW (ref 26.0–34.0)
Monocytes Relative: 9 % (ref 3–12)
Neutro Abs: 3.1 10*3/uL (ref 1.7–7.7)
Neutrophils Relative %: 64 % (ref 43–77)
Platelets: 183 10*3/uL (ref 150–400)
RBC: 3.41 MIL/uL — ABNORMAL LOW (ref 3.87–5.11)
WBC: 4.9 10*3/uL (ref 4.0–10.5)

## 2012-01-02 LAB — GLUCOSE, CAPILLARY
Glucose-Capillary: 116 mg/dL — ABNORMAL HIGH (ref 70–99)
Glucose-Capillary: 152 mg/dL — ABNORMAL HIGH (ref 70–99)
Glucose-Capillary: 105 mg/dL — ABNORMAL HIGH (ref 70–99)

## 2012-01-02 LAB — RETICULOCYTES
RBC.: 3.41 MIL/uL — ABNORMAL LOW (ref 3.87–5.11)
Retic Count, Absolute: 51.2 10*3/uL (ref 19.0–186.0)
Retic Ct Pct: 1.5 % (ref 0.4–3.1)

## 2012-01-02 MED ORDER — FUROSEMIDE 80 MG PO TABS
80.0000 mg | ORAL_TABLET | Freq: Once | ORAL | Status: AC
  Administered 2012-01-02: 80 mg via ORAL
  Filled 2012-01-02: qty 1

## 2012-01-02 MED ORDER — NITROGLYCERIN 0.2 MG/HR TD PT24: 1.0000 | MEDICATED_PATCH | Freq: Every day | TRANSDERMAL | Status: DC

## 2012-01-02 MED ORDER — TORSEMIDE 20 MG PO TABS
20.0000 mg | ORAL_TABLET | Freq: Every day | ORAL | Status: DC
  Administered 2012-01-03: 20 mg via ORAL
  Filled 2012-01-02: qty 1

## 2012-01-02 MED ORDER — ACETAMINOPHEN 325 MG PO TABS: 650.0000 mg | ORAL_TABLET | ORAL | Status: DC | PRN

## 2012-01-02 NOTE — Progress Notes (Signed)
Patient evaluated for long-term disease management services with [facility] Care Management Program.  Spoke with patient and patient's daughter, Albina Gosney, at bedside to explain [facility] Care Management services. Explained that patient will receive a post discharge transition of care call and will be evaluated for monthly home visits for assessments and for education. Discussed that [facility] Care Management does not interfere or replace any services that are arranged by inpatient case management or social work. Daughter Alona Bene reports patient has someone with her 24/7. Alona Bene lives with patient. Also patient's daughter reports the plan is for patient to go home with home health. Consents signed at bedside.  Raiford Noble, MSN, RN,BSN [facility] Liaison [phone]

## 2012-01-02 NOTE — Clinical Documentation Improvement (Signed)
CHEST PAIN DOCUMENTATION CLARIFICATION QUERY  THIS DOCUMENT IS NOT A PERMANENT PART OF THE MEDICAL RECORD  TO RESPOND TO THE THIS QUERY, FOLLOW THE INSTRUCTIONS BELOW:  1. If needed, update documentation for the patient's encounter via the notes activity.  2. Access this query again and click edit on the In [employer].  3. After updating, or not, click F2 to complete all highlighted (required) fields concerning your review. Select "additional documentation in the medical record" OR "no additional documentation provided".  4. Click Sign note button.  5. The deficiency will fall out of your In Basket *Please let us know if you are not able to complete this workflow by phone or e-mail (listed below).          01/02/12  Dear Dr. Ashley Royalty, Judie Petit Marton [place]  In an effort to better capture your patient's severity of illness, reflect appropriate length of stay and utilization of resources, a review of the patient medical record has revealed the following indicators.    Based on your clinical judgment, please clarify and document in a progress note and/or discharge summary the clinical condition associated with the following supporting information:  In responding to this query please exercise your independent judgment.  The fact that a query is asked, does not imply that any particular answer is desired or expected.  Based on your clinical judgment can you provide a diagnosis that represents the below listed clinical indicators?  In this patient admitted with UTI a review of the medical record reveals the following:   Pt c/o Chest Pain  "Grossly fluid overloaded" per pn 12/31/11  "continued diuresis" per pn 01/01/12  BNP=2693.0  Treatment: Lasix  Clarification Needed:   Please clarify the underlying cause for the  Chest pain and document in pn or d/c summary  Thank you for all that you do for our patients!    Possible Clinical Conditions?   Based on your clinical  judgment, please clarify cause of Chest Pain:  _______Acute MI _______Unstable Angina  _______ACS _______Musculoskeletal Chest Pain _______Congestive Heart Failure (Please state acuity and type) _______GERD _______Costochondritis _______Cholelithiasis / Cholecystitis _______Dressler's syndrome _______Chest wall pain _______Other Condition_______________________ _______Cannot Clinically Determine  Supporting Information: CAD, fluid overload,  Risk Factors: PN 12/31/11 Fluid Overload: PT is grossly fluid overloaded. I will give a dose of lasix and assess response. If BP tolerates and effective urine output, will continue with lasix per daily assessment as pt at risk for renal compromise.  PN 01/01/12 continued diuresis   Signs & Symptoms: SOB (shortness of breath): Weight loss s/p diuresis Pt has pacemaker per CXR on 12/82013  Diagnostics:  Lab:  Component     Latest Ref Rng 01/01/2012         4:35 AM  Pro B Natriuretic peptide (BNP)     0 - 450 pg/mL 2693.0 (H)   Radiology CXR 12/13/17IMPRESSION: Cardiomegaly with mild interstitial edema, grossly unchanged.   Suspected small left pleural effusion with associated left lower lobe opacity, likely atelectasis.  Treatment: Lasix Chest pain: Pt states that chest pain improved on low dose nitrates per pn 01/01/12  You may use possible, probable, or suspect with inpatient documentation. possible, probable, suspected diagnoses MUST be documented at the time of discharge  Reviewed: additional documentation in the medical record  Thank You,  Enis Slipper  Lolita J. Ambrose Mantle RN, BSN, MSN/Inf, CCDS Clinical Documentation Specialist [facility] HIM Dept Pager: [phone]  Health Information Management [place]

## 2012-01-02 NOTE — Progress Notes (Signed)
SICKLE CELL SERVICE PROGRESS NOTE  [name] MVH:[id number] DOB: 11-07-1920 DOA: 12/29/2011 PCP: Carrie Blade, MD  Assessment/Plan: Fluid Overload: Pt responded well to diuresis yesterday and has had a 15# weight loss 12/29/2011. Will give another dose of Lasix today and evaluate on a daily basis. Pt reports her dry weight as 198#. Thus she is almost at her dry weight. Please note that she had a normal ECHO thus ProBNP is not interpretable in this patient and the pulmonary is felt to be secondary to low protein state. Pt was on Demadex 20 mg in November but this was discontinued as pt had low BP. I will resume Demadex starting tomorrow. Can likely be discharged home tomorrow.   Microcytic anemia: Pt has a history of gastric Ulcer recently diagnosed by endoscopy on 12/22/2011.  Upon discharge on 12/10 2013, pt had a Hgb of 7.2. At the time if admission, patient had a Hgb of 8.7 which was down to 6.7 this morning necessitating a transfusion. She is currently on Plavix, ASA and Pletal all of which I have discontinued.Cardiology agrees with discontinuing antiplatelet therapy.  Chest pain: Felt to be secondary to angina associated with Cad and PUD.  Pt states that chest pain improved on low dose nitrates.  Will continue on this dose in light of concurrent diuresis and concern for maintaining adequate BP.  Suspected  UTI (lower urinary tract infection): U/C negative so antibiotics discontinued   Coronary artery disease: CABG in 1994 with last cath 2008 patent grafts and normal EF: Seen by Cardiology on last admission and had ECHO performed which showed normal EF and no signs of Diastolic dysfunction.   Diabetes mellitus: BS adequate. Diet liberalized to regular to allow for adequate caloric intake. BS WNL   Gastric ulcer:  Biopsy results showed atropic gastric. Pt is continued on Protonix.    SOB (shortness of breath): Secondary to fluid overload. Improved with diuresis.   Hypothyroidism: TSH  within normal limits. Continue current dose of Synthroid.  Disposition: Pt to be discharged to home with [facility] services resumed by [facility]. Order also placed for Hospital bed and 3 N 1.  Goals of Care: Goals of care from Palliative care meeting on last admission are noted however I think that they need to be revisited in the best interest of this patient. I have had a long discussion with patient's children today and presented information regarding pt's overall condition. Specifically: malnutrition and low protein state, Chest Pain- cardiac and GI source, Anemia. I've also delineated the role of ACLS resuscitation in [name]'s case and the potential outcomes.  I've recommended against full code status. Pt and family still want to proceed with Full Code.  Code Status: Full Code Family Communication: Son and daughter at bedside and updated.  Disposition Plan: Home at time of discharge  [name] A.  [facility] Pager [phone]. If 7PM-7AM, please contact night-coverage. 01/02/2012, 5:00 PM  LOS: 4 days   Brief narrative: 78 year old woman presented to ED today with cough, SOB and chest discomfort beginning this AM. Workup in ED notable for low-grade fever and UTI.  Patient hospitalized twice in last two weeks, first for acute renal failure, symptomatic anemia, UTI and then 12/1 to 12/10 for sepsis, UTI at which time she was diagnosed with a gastric ulcer, symptomatic hiatal hernia and was discharged on PPI, ASA, Plavix. SNF was recommended but refused and patient went home with [facility].  Per patient and daughter at bedside patient report patient did well until this  AM about 8 when she had severe reflux symptoms, acid in mouth (which she coughed on) and chest discomfort. Chest discomfort usually resolves with belch, but was unable to do so this morning. She became SOB with some wheeze and came to ED. In ED was noted to have a low grade fever but stable hemodynamics, UTI and stable blood work.  D-dimer was positive but VQ was negative, EKG showed a paced rhythm. No cardiac enzymes were ordered.   Consultants:  None  Procedures:  NONE  Antibiotics:  Levaquin 12/13 >>12/14  HPI/Subjective: Pt awake and alert. States that she feels well today.  Objective: Filed Vitals:   01/01/12 1427 01/01/12 2208 01/02/12 0500 01/02/12 0616  BP: 102/43 133/69  113/57  Pulse: 84 90  79  Temp: 97.3 F (36.3 C) 97.7 F (36.5 C)  99.1 F (37.3 C)  TempSrc: Oral Oral  Oral  Resp: 18 18  18   Height:      Weight:   91 kg (200 lb 9.9 oz)   SpO2: 95% 97%  98%   Weight change: -1.443 kg (-3 lb 2.9 oz)  Intake/Output Summary (Last 24 hours) at 01/02/12 1700 Last data filed at 01/02/12 1300  Gross per 24 hour  Intake    360 ml  Output      0 ml  Net    360 ml    General: Alert, awake, oriented x3, in no acute distress. Pt eating breakfast HEENT: [place]/AT PEERL, EOMI. Pt has some mild periorbital edema Heart: Regular rate and rhythm, without murmurs, rubs, gallops. Paced rhythm on telemetry.  Lungs: Clear to auscultation, no wheezing or rhonchi noted except  mild  bi-basilar crackles  Abdomen: Soft, obese, nontender, nondistended, positive bowel sounds, no masses no hepatosplenomegaly noted.  Neuro: No focal neurological deficits noted. Musculoskeletal: 1+ pedal edema.  No erythema around joints, no spinal tenderness noted. Psychiatric: Patient alert and oriented x3, good insight and cognition, good recent to remote recall.    Data Reviewed: Basic Metabolic Panel:  Lab 01/01/12 1610 12/31/11 0525 12/30/11 0542 12/29/11 1145  NA 136 -- 134* 136  K 4.4 -- 3.5 3.7  CL 103 -- 104 105  CO2 26 -- 22 21  GLUCOSE 154* -- 122* 135*  BUN 7 -- 5* 4*  CREATININE 0.91 -- 0.88 0.85  CALCIUM 8.4 -- 8.0* 9.0  MG 1.8 1.8 -- --  PHOS -- -- -- --   Liver Function Tests:  Lab 12/29/11 1145  AST 14  ALT <5  ALKPHOS 111  BILITOT 0.6  PROT 6.2  ALBUMIN 2.0*   No results found for this  basename: LIPASE:5,AMYLASE:5 in the last 168 hours No results found for this basename: AMMONIA:5 in the last 168 hours CBC:  Lab 01/02/12 0445 12/31/11 0525 12/30/11 0542 12/29/11 1145  WBC 4.9 5.0 5.2 6.9  NEUTROABS 3.1 3.2 -- 5.1  HGB 8.5* 8.1* 6.7* 8.7*  HCT 26.5* 25.2* 21.1* 27.2*  MCV 77.7* 77.1* 77.6* 76.6*  PLT 183 182 181 223   Cardiac Enzymes:  Lab 12/30/11 0020 12/29/11 1813  CKTOTAL -- --  CKMB -- --  CKMBINDEX -- --  TROPONINI <0.30 <0.30   BNP (last 3 results)  Basename 01/01/12 0435 12/17/11 1603 12/15/11 0650  PROBNP 2693.0* 2026.0* 1637.0*   CBG:  Lab 01/02/12 1131 01/02/12 0759 01/01/12 2158 01/01/12 1718 01/01/12 1220  GLUCAP 116* 113* 120* 94 89    Recent Results (from the past 240 hour(s))  CLOSTRIDIUM DIFFICILE BY PCR  Status: Normal   Collection Time   12/24/11  7:55 AM      Component Value Range Status Comment   C difficile by pcr NEGATIVE  NEGATIVE Final   URINE CULTURE     Status: Normal   Collection Time   12/29/11 10:49 AM      Component Value Range Status Comment   Specimen Description URINE, CATHETERIZED   Final    Special Requests Immunocompromised   Final    Culture  Setup Time 12/29/2011 18:29   Final    Colony Count NO GROWTH   Final    Culture NO GROWTH   Final    Report Status 12/30/2011 FINAL   Final      Studies: Ct Abdomen Pelvis Wo Contrast  12/17/2011  *RADIOLOGY REPORT*  Clinical Data: Severe nausea and vomiting.  Abdominal pain.  CT ABDOMEN AND PELVIS WITHOUT CONTRAST  Technique:  Multidetector CT imaging of the abdomen and pelvis was performed following the standard protocol without intravenous contrast.  Comparison: 06/16/2011  Findings: Probable tiny calcified gallstones again noted, however there is no evidence of cholecystitis or biliary ductal dilatation. Noncontrast images of the liver, pancreas, spleen, adrenal glands, and kidneys are unremarkable in appearance.  Chronic bilateral perinephric stranding again  demonstrated as well as probable tiny fluid attenuation left renal cyst.  No evidence of renal calculi or hydronephrosis.  No soft tissue masses or lymphadenopathy identified within the abdomen or pelvis.  Uterus and adnexal regions are unremarkable. No evidence of inflammatory process or abnormal fluid collections. No evidence of dilated bowel loops or hernia.  Cardiomegaly and diffuse coronary artery calcifications again demonstrated, and pacemaker leads again noted in the right heart.  IMPRESSION:  1.  Stable exam.  No acute findings. 2.  Cholelithiasis, without evidence of acute cholecystitis.   Original Report Authenticated By: Myles Rosenthal, M.D.    Dg Chest 2 View  12/29/2011  *RADIOLOGY REPORT*  Clinical Data: Shortness of breath, weakness  CHEST - 2 VIEW  Comparison: None.  Findings: Cardiomegaly with mild interstitial edema, grossly unchanged.  Left lower lobe opacity, likely a combination of small left pleural effusion and atelectasis.  Postsurgical changes related to prior CABG.  Left subclavian pacemaker.  Degenerative changes of the visualized thoracolumbar spine.  IMPRESSION: Cardiomegaly with mild interstitial edema, grossly unchanged.  Suspected small left pleural effusion with associated left lower lobe opacity, likely atelectasis.   Original Report Authenticated By: Charline Bills, M.D.    Nm Pulmonary Perf And Vent  12/29/2011  *RADIOLOGY REPORT*  Clinical Data:  Short of breath, concern pulmonary embolism  NUCLEAR MEDICINE VENTILATION - PERFUSION LUNG SCAN  Technique:  Ventilation images were obtained in multiple projections using inhaled aerosol technetium 99 M DTPA.  Perfusion images were obtained in multiple projections after intravenous injection of Tc-47m MAA.  Radiopharmaceuticals:  Tc-63m DTPA aerosol and 5.8 mCi Tc-83m MAA.  Comparison: Chest radiograph 12/29/2011  Findings:  Ventilation:   No focal ventilation defect.  Perfusion:   No wedge shaped peripheral perfusion  defects to suggest acute pulmonary embolism.  Cardiomegaly noted.  IMPRESSION: Very low probability acute pulmonary embolism.   Original Report Authenticated By: Genevive Bi, M.D.    Dg Chest Port 1 View  12/24/2011  *RADIOLOGY REPORT*  Clinical Data: Pain with deep inspiration  PORTABLE CHEST - 1 VIEW  Comparison: 12/23/2011  Findings: Cardiomegaly again noted.  Status post CABG.  Dual lead cardiac pacemaker is unchanged in position.  Persistent mild interstitial edema bilaterally.  Stable bilateral basilar atelectasis or infiltrate.  IMPRESSION: Dual lead cardiac pacemaker is unchanged in position.  Persistent mild interstitial edema bilaterally.  Stable bilateral basilar atelectasis or infiltrate.   Original Report Authenticated By: Natasha Mead, M.D.    Dg Chest Port 1 View  12/23/2011  *RADIOLOGY REPORT*  Clinical Data: Shortness of breath.  PORTABLE CHEST - 1 VIEW  Comparison: Chest 12/17/2011.  Findings: There is new pulmonary edema.  Small left effusion and basilar atelectasis are also noted.  Heart size is upper normal. The patient is status post CABG with a pacing device in place.  IMPRESSION: New pulmonary edema and small left effusion.   Original Report Authenticated By: Holley Dexter, M.D.    Dg Chest Port 1 View  12/17/2011  *RADIOLOGY REPORT*  Clinical Data: Shortness of breath, pain, weakness, and vomiting  PORTABLE CHEST - 1 VIEW  Comparison: Chest radiograph 12/15/2011  Findings: Stable cardiomegaly in this patient status post median sternotomy for CABG.  Left chest wall dual lead pacer appears stable.  Lung volumes are low.  Stable elevation of the right hemidiaphragm.  The lungs appear clear.  No visible pleural effusion or pneumothorax. Visualized upper abdomen is unremarkable.  IMPRESSION: Stable chest radiograph.  No acute cardiopulmonary disease identified.   Original Report Authenticated By: Britta Mccreedy, M.D.    Dg Chest Port 1 View  12/15/2011  *RADIOLOGY REPORT*  Clinical  Data: Fever.  PORTABLE CHEST - 1 VIEW  Comparison: 09/12/2011  Findings: Stable appearance of dual chamber pacemaker.  Lungs show no evidence of pulmonary edema or focal consolidation.  There is stable elevation of the right hemidiaphragm.  Heart size is stable status post prior CABG.  No significant pleural effusions are identified.  IMPRESSION: No active disease.   Original Report Authenticated By: Irish Lack, M.D.    Dg Foot Complete Right  12/17/2011  *RADIOLOGY REPORT*  Clinical Data: Right foot pain.  No known injury.  RIGHT FOOT COMPLETE - 3+ VIEW  Comparison: None.  Findings: Sock containing radiopaque material limits fine bone detail.  There is no evidence of acute fracture or dislocation.  No other significant bone abnormality identified.  Peripheral vascular calcification noted.  IMPRESSION: No acute findings.   Original Report Authenticated By: Myles Rosenthal, M.D.     Scheduled Meds:    . atorvastatin  20 mg Oral q1800  . feeding supplement  237 mL Oral Q24H  . gabapentin  100 mg Oral BID  . insulin aspart  0-9 Units Subcutaneous TID WC  . levothyroxine  50 mcg Oral QAC breakfast  . loratadine  10 mg Oral Daily  . metoprolol tartrate  12.5 mg Oral BID  . nitroGLYCERIN  0.2 mg Transdermal Daily  . pantoprazole  40 mg Oral BID AC  . potassium chloride  20 mEq Oral BID  . sodium chloride  3 mL Intravenous Q12H  . sodium chloride  3 mL Intravenous Q12H   Continuous Infusions:   Principal Problem:  *Fluid overload Active Problems:  Microcytic anemia  Diabetes mellitus  Coronary artery disease, CABG in 1994 with last cath 2008 patent grafts and normal EF  Gastric ulcer  Chest pain  SOB (shortness of breath)  Hiatal hernia  Dyspepsia

## 2012-01-03 DIAGNOSIS — B9689 Other specified bacterial agents as the cause of diseases classified elsewhere: Secondary | ICD-10-CM

## 2012-01-03 DIAGNOSIS — I10 Essential (primary) hypertension: Secondary | ICD-10-CM

## 2012-01-03 DIAGNOSIS — D638 Anemia in other chronic diseases classified elsewhere: Secondary | ICD-10-CM

## 2012-01-03 DIAGNOSIS — A499 Bacterial infection, unspecified: Secondary | ICD-10-CM

## 2012-01-03 LAB — BASIC METABOLIC PANEL
CO2: 31 mEq/L (ref 19–32)
Chloride: 96 mEq/L (ref 96–112)
Glucose, Bld: 149 mg/dL — ABNORMAL HIGH (ref 70–99)
Potassium: 4.1 mEq/L (ref 3.5–5.1)
Sodium: 135 mEq/L (ref 135–145)

## 2012-01-03 LAB — GLUCOSE, CAPILLARY: Glucose-Capillary: 144 mg/dL — ABNORMAL HIGH (ref 70–99)

## 2012-01-03 LAB — MAGNESIUM: Magnesium: 1.7 mg/dL (ref 1.5–2.5)

## 2012-01-03 MED ORDER — LEVOFLOXACIN 250 MG PO TABS: 250.0000 mg | ORAL_TABLET | Freq: Every day | ORAL | Status: AC

## 2012-01-03 MED ORDER — TORSEMIDE 20 MG PO TABS: 20.0000 mg | ORAL_TABLET | Freq: Every day | ORAL | Status: DC

## 2012-01-03 NOTE — Progress Notes (Signed)
Pt received 80mg  of lasix earlier in the day, pt is incontinent and a 2 person max assist with cleaning. Since 7p, pt has had incontinent episodes every 30 mins to an hour. As soon as we leave the room, within 10-20 mins pt or family lets [place] know she is wet again. Pt is saturating through 3 pads with each time. After 7 incontinent episodes over 6 hrs, a foley was requested. Pt was getting no rest and was extremely wore out from the constant turning with cleaning. Granddaughter at bedside and pt ok with foley insertion. Received about 250cc out with insertion. Pt now resting quietly. Will continue to monitor.   Ayleen Mckinstry, [place] Edwards RN

## 2012-01-03 NOTE — Discharge Summary (Signed)
Physician Discharge Summary  [name]:[id number] DOB: 1920-04-26 DOA: 12/29/2011  PCP: Willey Blade, MD  Admit date: 12/29/2011 Discharge date: 01/03/2012  Time spent: >30 minutes  Recommendations for Outpatient Follow-up:  -Follow with PCP in 1 week (repeat BMET to follow electrolytes and kidney function. Will also need CBC to follow Hgb level and anemia. Patient A1C 7.7, needs to be started on hypoglycemic agent; family will like to discuss with PCP before starting any other long term medication). -Advised to follow heart healthy diet, low carbohydrates intake and to increase protein.  Discharge Diagnoses:  Principal Problem:  *Fluid overload Active Problems:  Microcytic anemia  Diabetes mellitus  Coronary artery disease, CABG in 1994 with last cath 2008 patent grafts and normal EF  Gastric ulcer  Chest pain  SOB (shortness of breath)  Hiatal hernia  Dyspepsia   Discharge Condition: stable and improved. Still deconditioned, but patient has refused SNF. Will be discharge home with [facility] services. Patient w/o SOB, CP or any other acute complaint. Advise to follow a low sodium diet and to follow with PCP in 1 week.  Diet recommendation: low carbohydrates and heart healthy diet.  Filed Weights   01/01/12 0637 01/02/12 0500 01/03/12 0614  Weight: 92.443 kg (203 lb 12.8 oz) 91 kg (200 lb 9.9 oz) 87.499 kg (192 lb 14.4 oz)    History of present illness:  78 year old woman presented to ED today with cough, SOB and chest discomfort beginning this AM. Workup in ED notable for low-grade fever and UTI.  Patient hospitalized twice in last two weeks, first for acute renal failure, symptomatic anemia, UTI and then 12/1 to 12/10 for sepsis, UTI at which time she was diagnosed with a gastric ulcer. SNF was recommended but refused and patient went home with [facility].  Per patient and daughter at bedside patient report patient did well until this AM about 8 when she had severe reflux  symptoms, acid in mouth (which she coughed on) and chest discomfort. Chest discomfort usually resolves with belch, but was unable to do so this morning. She became SOB with some wheeze and came to ED. In ED was noted to have a low grade fever but stable hemodynamics, UTI and stable blood work. D-dimer was positive but VQ was negative, EKG showed a paced rhythm   Hospital Course:  Fluid Overload: Pt responded well to diuresis yesterday and has had a 15# weight loss since 12/29/2011. Will discharge on demadex and she will follow with PCP in 1 week. Patient advised to follow a low sodium diet and to increase protein intake (started on ensure).Please note that she had a normal ECHO thus ProBNP is not interpretable in this patient and the pulmonary congestion is felt to be secondary to low protein state.   Microcytic anemia: Pt has a history of gastric Ulcer recently diagnosed by endoscopy on 12/22/2011. Upon discharge on 12/10 2013, pt had a Hgb of 7.2. At the time if admission, patient had a Hgb of 8.7 which was down to 6.7 on admission. Patient received 2 units of PRBC's and since them Hgb has been stable.Plavix and pletal has been discontinued.     Chest pain: Felt to be secondary to PUD. Pt states no pain at discharge. Will continue PPI; patient also due to hx of CAD, will continue B-blocker, statins and ASA for secondary prevention.  Suspected UTI (lower urinary tract infection): urine culture unrevealing for infections; but patient already on antibiotics when take. Will finish PO regimen. Follow symptoms  resolution during visit with PCP (dysuria, increase frequency)  Coronary artery disease: CABG in 1994 with last cath 2008 patent grafts and normal EF: Seen by Cardiology on last admission and had ECHO performed which showed normal EF and no signs of Diastolic dysfunction.   Diabetes mellitus: BS remains adequate during hospitalization. Patient will follow a low carbohydrates diet. Recent A1C 7.7. Will  follow with PCP in 1 week, at time further adjustment and decision on which hypoglycemic agent and when to start it will be made.   Gastric ulcer: Biopsy results showed atrophic gastric mucose. Pt is continued on Protonix.   SOB (shortness of breath): Secondary to fluid overload. Improved with diuresis. Will continue demadex; follow up with PCP in 1 week for further medication adjustment and follow up on her symptoms. Patient advised to follow a low sodium diet and also increase protein intake.  Hypothyroidism: TSH within normal limits. Continue current dose of Synthroid.   Disposition: Pt discharged to home with [facility] services resumed by [facility], including [facility]. Order also placed for Hospital bed and 3 N 1.   Procedures:  See below for procedures and full report.  Consultations:  cardiology  Discharge Exam: Filed Vitals:   01/02/12 0616 01/02/12 2041 01/03/12 0347 01/03/12 0614  BP: 113/57 120/55 108/62   Pulse: 79 82 85   Temp: 99.1 F (37.3 C) 98.8 F (37.1 C) 99.8 F (37.7 C)   TempSrc: Oral Oral Oral   Resp: 18 20 20    Height:      Weight:    87.499 kg (192 lb 14.4 oz)  SpO2: 98% 97% 95%    General: Alert, awake, oriented x3, in no acute distress. Afebrile  HEENT: [place]/AT PEERL, EOMI. Pt has some mild periorbital edema  Heart: Regular rate and rhythm, without murmurs, rubs, gallops. Paced rhythm on telemetry.  Lungs: Clear to auscultation, no wheezing or rhonchi noted except mild decrease BS at bases Abdomen: Soft, obese, nontender, nondistended, positive bowel sounds, no masses no hepatosplenomegaly noted.  Neuro: No focal neurological deficits noted.  Musculoskeletal: 1+ pedal edema. No erythema around joints, no spinal tenderness noted.  Psychiatric: Patient alert and oriented x3, good insight and cognition, good recent to remote recall.   Discharge Instructions  Discharge Orders    Future Orders Please Complete By Expires   Discharge instructions      Comments:    -Take medications as prescribed -Follow a low sodium diet -Arrange follow up with PCP in 1 week -keep yourself hydrated and take feeding supplements (Ensure) as instructed       Medication List     As of 01/03/2012  5:32 PM    STOP taking these medications         cilostazol 100 MG tablet   Commonly known as: PLETAL      clopidogrel 75 MG tablet   Commonly known as: PLAVIX      TAKE these medications         aspirin 81 MG chewable tablet   Chew 81 mg by mouth daily.      feeding supplement Liqd   Take 237 mLs by mouth daily.      gabapentin 100 MG capsule   Commonly known as: NEURONTIN   Take 100 mg by mouth 2 (two) times daily.      levofloxacin 250 MG tablet   Commonly known as: LEVAQUIN   Take 1 tablet (250 mg total) by mouth daily.   Start taking on: 01/04/2012  levothyroxine 50 MCG tablet   Commonly known as: SYNTHROID, LEVOTHROID   Take 50 mcg by mouth daily.      loratadine 10 MG tablet   Commonly known as: CLARITIN   Take 10 mg by mouth daily. For allergies      magic mouthwash Soln   Take 5 mLs by mouth as needed. Swish and spit      metoprolol tartrate 25 MG tablet   Commonly known as: LOPRESSOR   Take 0.5 tablets (12.5 mg total) by mouth 2 (two) times daily.      nitroGLYCERIN 0.2 mg/hr   Commonly known as: NITRODUR - Dosed in mg/24 hr   Place 1 patch (0.2 mg total) onto the skin daily.      pantoprazole 40 MG tablet   Commonly known as: PROTONIX   Take 1 tablet (40 mg total) by mouth 2 (two) times daily before a meal.      rosuvastatin 10 MG tablet   Commonly known as: CRESTOR   Take 10 mg by mouth daily.      torsemide 20 MG tablet   Commonly known as: DEMADEX   Take 1 tablet (20 mg total) by mouth daily.      Vitamin D (Ergocalciferol) 50000 UNITS Caps   Commonly known as: DRISDOL   Take 50,000 Units by mouth every 7 (seven) days. Mondays      zolpidem 10 MG tablet   Commonly known as: AMBIEN   Take 5 mg by mouth at bedtime  as needed. sleep           Follow-up Information    Follow up with August Saucer, [name], MD. Schedule an appointment as soon as possible for a visit in 1 week.   Contact information:   [address] [place] [address] [phone]           The results of significant diagnostics from this hospitalization (including imaging, microbiology, ancillary and laboratory) are listed below for reference.    Significant Diagnostic Studies: Ct Abdomen Pelvis Wo Contrast  12/17/2011  *RADIOLOGY REPORT*  Clinical Data: Severe nausea and vomiting.  Abdominal pain.  CT ABDOMEN AND PELVIS WITHOUT CONTRAST  Technique:  Multidetector CT imaging of the abdomen and pelvis was performed following the standard protocol without intravenous contrast.  Comparison: 06/16/2011  Findings: Probable tiny calcified gallstones again noted, however there is no evidence of cholecystitis or biliary ductal dilatation. Noncontrast images of the liver, pancreas, spleen, adrenal glands, and kidneys are unremarkable in appearance.  Chronic bilateral perinephric stranding again demonstrated as well as probable tiny fluid attenuation left renal cyst.  No evidence of renal calculi or hydronephrosis.  No soft tissue masses or lymphadenopathy identified within the abdomen or pelvis.  Uterus and adnexal regions are unremarkable. No evidence of inflammatory process or abnormal fluid collections. No evidence of dilated bowel loops or hernia.  Cardiomegaly and diffuse coronary artery calcifications again demonstrated, and pacemaker leads again noted in the right heart.  IMPRESSION:  1.  Stable exam.  No acute findings. 2.  Cholelithiasis, without evidence of acute cholecystitis.   Original Report Authenticated By: Myles Rosenthal, M.D.    Dg Chest 2 View  12/29/2011  *RADIOLOGY REPORT*  Clinical Data: Shortness of breath, weakness  CHEST - 2 VIEW  Comparison: None.  Findings: Cardiomegaly with mild interstitial edema, grossly unchanged.  Left  lower lobe opacity, likely a combination of small left pleural effusion and atelectasis.  Postsurgical changes related to prior CABG.  Left  subclavian pacemaker.  Degenerative changes of the visualized thoracolumbar spine.  IMPRESSION: Cardiomegaly with mild interstitial edema, grossly unchanged.  Suspected small left pleural effusion with associated left lower lobe opacity, likely atelectasis.   Original Report Authenticated By: Charline Bills, M.D.    Nm Pulmonary Perf And Vent  12/29/2011  *RADIOLOGY REPORT*  Clinical Data:  Short of breath, concern pulmonary embolism  NUCLEAR MEDICINE VENTILATION - PERFUSION LUNG SCAN  Technique:  Ventilation images were obtained in multiple projections using inhaled aerosol technetium 99 M DTPA.  Perfusion images were obtained in multiple projections after intravenous injection of Tc-66m MAA.  Radiopharmaceuticals:  Tc-58m DTPA aerosol and 5.8 mCi Tc-71m MAA.  Comparison: Chest radiograph 12/29/2011  Findings:  Ventilation:   No focal ventilation defect.  Perfusion:   No wedge shaped peripheral perfusion defects to suggest acute pulmonary embolism.  Cardiomegaly noted.  IMPRESSION: Very low probability acute pulmonary embolism.   Original Report Authenticated By: Genevive Bi, M.D.    Dg Chest Port 1 View  12/24/2011  *RADIOLOGY REPORT*  Clinical Data: Pain with deep inspiration  PORTABLE CHEST - 1 VIEW  Comparison: 12/23/2011  Findings: Cardiomegaly again noted.  Status post CABG.  Dual lead cardiac pacemaker is unchanged in position.  Persistent mild interstitial edema bilaterally.  Stable bilateral basilar atelectasis or infiltrate.  IMPRESSION: Dual lead cardiac pacemaker is unchanged in position.  Persistent mild interstitial edema bilaterally.  Stable bilateral basilar atelectasis or infiltrate.   Original Report Authenticated By: Natasha Mead, M.D.    Dg Chest Port 1 View  12/23/2011  *RADIOLOGY REPORT*  Clinical Data: Shortness of breath.  PORTABLE  CHEST - 1 VIEW  Comparison: Chest 12/17/2011.  Findings: There is new pulmonary edema.  Small left effusion and basilar atelectasis are also noted.  Heart size is upper normal. The patient is status post CABG with a pacing device in place.  IMPRESSION: New pulmonary edema and small left effusion.   Original Report Authenticated By: Holley Dexter, M.D.    Dg Chest Port 1 View  12/17/2011  *RADIOLOGY REPORT*  Clinical Data: Shortness of breath, pain, weakness, and vomiting  PORTABLE CHEST - 1 VIEW  Comparison: Chest radiograph 12/15/2011  Findings: Stable cardiomegaly in this patient status post median sternotomy for CABG.  Left chest wall dual lead pacer appears stable.  Lung volumes are low.  Stable elevation of the right hemidiaphragm.  The lungs appear clear.  No visible pleural effusion or pneumothorax. Visualized upper abdomen is unremarkable.  IMPRESSION: Stable chest radiograph.  No acute cardiopulmonary disease identified.   Original Report Authenticated By: Britta Mccreedy, M.D.    Dg Chest Port 1 View  12/15/2011  *RADIOLOGY REPORT*  Clinical Data: Fever.  PORTABLE CHEST - 1 VIEW  Comparison: 09/12/2011  Findings: Stable appearance of dual chamber pacemaker.  Lungs show no evidence of pulmonary edema or focal consolidation.  There is stable elevation of the right hemidiaphragm.  Heart size is stable status post prior CABG.  No significant pleural effusions are identified.  IMPRESSION: No active disease.   Original Report Authenticated By: Irish Lack, M.D.    Dg Foot Complete Right  12/17/2011  *RADIOLOGY REPORT*  Clinical Data: Right foot pain.  No known injury.  RIGHT FOOT COMPLETE - 3+ VIEW  Comparison: None.  Findings: Sock containing radiopaque material limits fine bone detail.  There is no evidence of acute fracture or dislocation.  No other significant bone abnormality identified.  Peripheral vascular calcification noted.  IMPRESSION: No acute findings.  Original Report Authenticated  By: Myles Rosenthal, M.D.     Microbiology: Recent Results (from the past 240 hour(s))  URINE CULTURE     Status: Normal   Collection Time   12/29/11 10:49 AM      Component Value Range Status Comment   Specimen Description URINE, CATHETERIZED   Final    Special Requests Immunocompromised   Final    Culture  Setup Time 12/29/2011 18:29   Final    Colony Count NO GROWTH   Final    Culture NO GROWTH   Final    Report Status 12/30/2011 FINAL   Final      Labs: Basic Metabolic Panel:  Lab 01/03/12 1610 01/01/12 0435 12/31/11 0525 12/30/11 0542 12/29/11 1145  NA 135 136 -- 134* 136  K 4.1 4.4 -- 3.5 3.7  CL 96 103 -- 104 105  CO2 31 26 -- 22 21  GLUCOSE 149* 154* -- 122* 135*  BUN 10 7 -- 5* 4*  CREATININE 1.03 0.91 -- 0.88 0.85  CALCIUM 9.1 8.4 -- 8.0* 9.0  MG 1.7 1.8 1.8 -- --  PHOS -- -- -- -- --   Liver Function Tests:  Lab 12/29/11 1145  AST 14  ALT <5  ALKPHOS 111  BILITOT 0.6  PROT 6.2  ALBUMIN 2.0*   CBC:  Lab 01/02/12 0445 12/31/11 0525 12/30/11 0542 12/29/11 1145  WBC 4.9 5.0 5.2 6.9  NEUTROABS 3.1 3.2 -- 5.1  HGB 8.5* 8.1* 6.7* 8.7*  HCT 26.5* 25.2* 21.1* 27.2*  MCV 77.7* 77.1* 77.6* 76.6*  PLT 183 182 181 223   Cardiac Enzymes:  Lab 12/30/11 0020 12/29/11 1813  CKTOTAL -- --  CKMB -- --  CKMBINDEX -- --  TROPONINI <0.30 <0.30   BNP: BNP (last 3 results)  Basename 01/01/12 0435 12/17/11 1603 12/15/11 0650  PROBNP 2693.0* 2026.0* 1637.0*   CBG:  Lab 01/03/12 1254 01/03/12 0804 01/02/12 2038 01/02/12 1715 01/02/12 1131  GLUCAP 134* 144* 105* 152* 116*     Signed:  Traci Plemons  [facility] 01/03/2012, 5:32 PM

## 2012-01-23 NOTE — Discharge Summary (Signed)
[name] presented for blood draw. Portacath access and flush. Procedure without incident. Patient tolerated procedure well. If she meets criteria for transfusion, arrangements will be made for her to return to the unit for transfusion.

## 2012-01-23 NOTE — H&P (Signed)
[name] presented for blood draw.  Labs for CBC, CMET, and clot for type and screen were drawn. Procedure without incident. Patient tolerated procedure well.

## 2012-02-22 ENCOUNTER — Inpatient Hospital Stay (HOSPITAL_COMMUNITY)
Admission: EM | Admit: 2012-02-22 | Discharge: 2012-02-26 | DRG: 690 | Disposition: A | Discharge status: Home Health | Payer: Medicare Other | Attending: Family Medicine | Admitting: Family Medicine

## 2012-02-22 ENCOUNTER — Emergency Department (HOSPITAL_COMMUNITY): Payer: Medicare Other

## 2012-02-22 ENCOUNTER — Other Ambulatory Visit (HOSPITAL_COMMUNITY): Payer: Self-pay | Admitting: Physical Medicine & Rehabilitation

## 2012-02-22 DIAGNOSIS — R5383 Other fatigue: Secondary | ICD-10-CM

## 2012-02-22 DIAGNOSIS — E119 Type 2 diabetes mellitus without complications: Secondary | ICD-10-CM

## 2012-02-22 DIAGNOSIS — Z79899 Other long term (current) drug therapy: Secondary | ICD-10-CM

## 2012-02-22 DIAGNOSIS — E871 Hypo-osmolality and hyponatremia: Secondary | ICD-10-CM

## 2012-02-22 DIAGNOSIS — R112 Nausea with vomiting, unspecified: Secondary | ICD-10-CM

## 2012-02-22 DIAGNOSIS — I509 Heart failure, unspecified: Secondary | ICD-10-CM | POA: Diagnosis present

## 2012-02-22 DIAGNOSIS — R079 Chest pain, unspecified: Secondary | ICD-10-CM

## 2012-02-22 DIAGNOSIS — I252 Old myocardial infarction: Secondary | ICD-10-CM

## 2012-02-22 DIAGNOSIS — R1013 Epigastric pain: Secondary | ICD-10-CM

## 2012-02-22 DIAGNOSIS — E8770 Fluid overload, unspecified: Secondary | ICD-10-CM

## 2012-02-22 DIAGNOSIS — N179 Acute kidney failure, unspecified: Secondary | ICD-10-CM

## 2012-02-22 DIAGNOSIS — R630 Anorexia: Secondary | ICD-10-CM

## 2012-02-22 DIAGNOSIS — A499 Bacterial infection, unspecified: Secondary | ICD-10-CM

## 2012-02-22 DIAGNOSIS — E46 Unspecified protein-calorie malnutrition: Secondary | ICD-10-CM | POA: Diagnosis present

## 2012-02-22 DIAGNOSIS — H548 Legal blindness, as defined in USA: Secondary | ICD-10-CM | POA: Diagnosis present

## 2012-02-22 DIAGNOSIS — E039 Hypothyroidism, unspecified: Secondary | ICD-10-CM | POA: Diagnosis present

## 2012-02-22 DIAGNOSIS — A411 Sepsis due to other specified staphylococcus: Secondary | ICD-10-CM

## 2012-02-22 DIAGNOSIS — N390 Urinary tract infection, site not specified: Principal | ICD-10-CM

## 2012-02-22 DIAGNOSIS — I10 Essential (primary) hypertension: Secondary | ICD-10-CM

## 2012-02-22 DIAGNOSIS — B958 Unspecified staphylococcus as the cause of diseases classified elsewhere: Secondary | ICD-10-CM

## 2012-02-22 DIAGNOSIS — Z880 Allergy status to penicillin: Secondary | ICD-10-CM

## 2012-02-22 DIAGNOSIS — N189 Chronic kidney disease, unspecified: Secondary | ICD-10-CM

## 2012-02-22 DIAGNOSIS — Z950 Presence of cardiac pacemaker: Secondary | ICD-10-CM

## 2012-02-22 DIAGNOSIS — R509 Fever, unspecified: Secondary | ICD-10-CM

## 2012-02-22 DIAGNOSIS — E118 Type 2 diabetes mellitus with unspecified complications: Secondary | ICD-10-CM | POA: Diagnosis present

## 2012-02-22 DIAGNOSIS — I2510 Atherosclerotic heart disease of native coronary artery without angina pectoris: Secondary | ICD-10-CM | POA: Diagnosis present

## 2012-02-22 DIAGNOSIS — I959 Hypotension, unspecified: Secondary | ICD-10-CM | POA: Diagnosis present

## 2012-02-22 DIAGNOSIS — K449 Diaphragmatic hernia without obstruction or gangrene: Secondary | ICD-10-CM

## 2012-02-22 DIAGNOSIS — R634 Abnormal weight loss: Secondary | ICD-10-CM

## 2012-02-22 DIAGNOSIS — I129 Hypertensive chronic kidney disease with stage 1 through stage 4 chronic kidney disease, or unspecified chronic kidney disease: Secondary | ICD-10-CM | POA: Diagnosis present

## 2012-02-22 DIAGNOSIS — D509 Iron deficiency anemia, unspecified: Secondary | ICD-10-CM

## 2012-02-22 DIAGNOSIS — R0602 Shortness of breath: Secondary | ICD-10-CM

## 2012-02-22 DIAGNOSIS — Z6830 Body mass index (BMI) 30.0-30.9, adult: Secondary | ICD-10-CM

## 2012-02-22 DIAGNOSIS — K259 Gastric ulcer, unspecified as acute or chronic, without hemorrhage or perforation: Secondary | ICD-10-CM

## 2012-02-22 DIAGNOSIS — D638 Anemia in other chronic diseases classified elsewhere: Secondary | ICD-10-CM

## 2012-02-22 DIAGNOSIS — E860 Dehydration: Secondary | ICD-10-CM | POA: Diagnosis present

## 2012-02-22 DIAGNOSIS — G8929 Other chronic pain: Secondary | ICD-10-CM

## 2012-02-22 DIAGNOSIS — D649 Anemia, unspecified: Secondary | ICD-10-CM | POA: Diagnosis present

## 2012-02-22 DIAGNOSIS — E875 Hyperkalemia: Secondary | ICD-10-CM

## 2012-02-22 DIAGNOSIS — A498 Other bacterial infections of unspecified site: Secondary | ICD-10-CM | POA: Diagnosis present

## 2012-02-22 DIAGNOSIS — K3189 Other diseases of stomach and duodenum: Secondary | ICD-10-CM

## 2012-02-22 LAB — URINE MICROSCOPIC-ADD ON

## 2012-02-22 LAB — LACTIC ACID, PLASMA: Lactic Acid, Venous: 1.6 mmol/L (ref 0.5–2.2)

## 2012-02-22 LAB — CBC WITH DIFFERENTIAL/PLATELET
Basophils Absolute: 0 10*3/uL (ref 0.0–0.1)
Basophils Relative: 0 % (ref 0–1)
Lymphocytes Relative: 5 % — ABNORMAL LOW (ref 12–46)
MCHC: 32.1 g/dL (ref 30.0–36.0)
Neutro Abs: 9.6 10*3/uL — ABNORMAL HIGH (ref 1.7–7.7)
Neutrophils Relative %: 87 % — ABNORMAL HIGH (ref 43–77)
RDW: 18.8 % — ABNORMAL HIGH (ref 11.5–15.5)
WBC: 11 10*3/uL — ABNORMAL HIGH (ref 4.0–10.5)

## 2012-02-22 LAB — URINALYSIS, ROUTINE W REFLEX MICROSCOPIC
Glucose, UA: NEGATIVE mg/dL
Ketones, ur: NEGATIVE mg/dL
pH: 6 (ref 5.0–8.0)

## 2012-02-22 LAB — GLUCOSE, CAPILLARY
Glucose-Capillary: 139 mg/dL — ABNORMAL HIGH (ref 70–99)
Glucose-Capillary: 198 mg/dL — ABNORMAL HIGH (ref 70–99)

## 2012-02-22 LAB — POCT I-STAT, CHEM 8
BUN: 17 mg/dL (ref 6–23)
Calcium, Ion: 1.21 mmol/L (ref 1.13–1.30)
Chloride: 106 mEq/L (ref 96–112)
Creatinine, Ser: 1.7 mg/dL — ABNORMAL HIGH (ref 0.50–1.10)
Glucose, Bld: 160 mg/dL — ABNORMAL HIGH (ref 70–99)

## 2012-02-22 MED ORDER — PANTOPRAZOLE SODIUM 40 MG PO TBEC
40.0000 mg | DELAYED_RELEASE_TABLET | Freq: Two times a day (BID) | ORAL | Status: DC
  Administered 2012-02-23 (×2): 40 mg via ORAL
  Filled 2012-02-22 (×4): qty 1

## 2012-02-22 MED ORDER — ASPIRIN 81 MG PO CHEW
81.0000 mg | CHEWABLE_TABLET | Freq: Every day | ORAL | Status: DC
  Administered 2012-02-23 – 2012-02-26 (×4): 81 mg via ORAL
  Filled 2012-02-22 (×4): qty 1

## 2012-02-22 MED ORDER — ZOLPIDEM TARTRATE 5 MG PO TABS
5.0000 mg | ORAL_TABLET | Freq: Every evening | ORAL | Status: DC | PRN
  Administered 2012-02-23 – 2012-02-25 (×3): 5 mg via ORAL
  Filled 2012-02-22 (×3): qty 1

## 2012-02-22 MED ORDER — FOLIC ACID 1 MG PO TABS
1.0000 mg | ORAL_TABLET | Freq: Every day | ORAL | Status: DC
  Administered 2012-02-23 – 2012-02-26 (×4): 1 mg via ORAL
  Filled 2012-02-22 (×4): qty 1

## 2012-02-22 MED ORDER — FERROUS SULFATE 325 (65 FE) MG PO TABS
325.0000 mg | ORAL_TABLET | Freq: Every day | ORAL | Status: DC
  Administered 2012-02-23 – 2012-02-26 (×4): 325 mg via ORAL
  Filled 2012-02-22 (×5): qty 1

## 2012-02-22 MED ORDER — ACETAMINOPHEN 650 MG RE SUPP: 650.0000 mg | Freq: Four times a day (QID) | RECTAL | Status: DC | PRN

## 2012-02-22 MED ORDER — CIPROFLOXACIN IN D5W 400 MG/200ML IV SOLN
400.0000 mg | Freq: Once | INTRAVENOUS | Status: AC
  Administered 2012-02-22: 400 mg via INTRAVENOUS
  Filled 2012-02-22: qty 200

## 2012-02-22 MED ORDER — ONDANSETRON HCL 4 MG/2ML IJ SOLN: 4.0000 mg | Freq: Four times a day (QID) | INTRAMUSCULAR | Status: DC | PRN

## 2012-02-22 MED ORDER — INSULIN ASPART 100 UNIT/ML ~~LOC~~ SOLN
0.0000 [IU] | Freq: Three times a day (TID) | SUBCUTANEOUS | Status: DC
  Administered 2012-02-23: 1 [IU] via SUBCUTANEOUS
  Administered 2012-02-23: 2 [IU] via SUBCUTANEOUS
  Administered 2012-02-24: 1 [IU] via SUBCUTANEOUS

## 2012-02-22 MED ORDER — METOPROLOL TARTRATE 12.5 MG HALF TABLET
12.5000 mg | ORAL_TABLET | Freq: Two times a day (BID) | ORAL | Status: DC
  Administered 2012-02-23 – 2012-02-26 (×4): 12.5 mg via ORAL
  Filled 2012-02-22 (×9): qty 1

## 2012-02-22 MED ORDER — SODIUM CHLORIDE 0.9 % IV SOLN: INTRAVENOUS | Status: DC

## 2012-02-22 MED ORDER — HEPARIN SODIUM (PORCINE) 5000 UNIT/ML IJ SOLN
5000.0000 [IU] | Freq: Three times a day (TID) | INTRAMUSCULAR | Status: DC
  Administered 2012-02-23 – 2012-02-26 (×12): 5000 [IU] via SUBCUTANEOUS
  Filled 2012-02-22 (×15): qty 1

## 2012-02-22 MED ORDER — VITAMIN D (ERGOCALCIFEROL) 1.25 MG (50000 UNIT) PO CAPS
50000.0000 [IU] | ORAL_CAPSULE | ORAL | Status: DC
  Administered 2012-02-26: 50000 [IU] via ORAL
  Filled 2012-02-22: qty 1

## 2012-02-22 MED ORDER — ACETAMINOPHEN 325 MG PO TABS
650.0000 mg | ORAL_TABLET | Freq: Four times a day (QID) | ORAL | Status: DC | PRN
  Administered 2012-02-23 – 2012-02-25 (×3): 650 mg via ORAL
  Filled 2012-02-22 (×3): qty 2

## 2012-02-22 MED ORDER — ATORVASTATIN CALCIUM 40 MG PO TABS
40.0000 mg | ORAL_TABLET | Freq: Every day | ORAL | Status: DC
  Administered 2012-02-23 – 2012-02-25 (×3): 40 mg via ORAL
  Filled 2012-02-22 (×4): qty 1

## 2012-02-22 MED ORDER — SODIUM CHLORIDE 0.9 % IV SOLN
500.0000 mg | Freq: Once | INTRAVENOUS | Status: AC
  Administered 2012-02-23: 500 mg via INTRAVENOUS
  Filled 2012-02-22 (×2): qty 500

## 2012-02-22 MED ORDER — POTASSIUM CHLORIDE CRYS ER 20 MEQ PO TBCR
20.0000 meq | EXTENDED_RELEASE_TABLET | Freq: Every day | ORAL | Status: DC
  Administered 2012-02-23 – 2012-02-26 (×4): 20 meq via ORAL
  Filled 2012-02-22 (×4): qty 1

## 2012-02-22 MED ORDER — LORATADINE 10 MG PO TABS
10.0000 mg | ORAL_TABLET | Freq: Every day | ORAL | Status: DC
  Administered 2012-02-23 – 2012-02-26 (×4): 10 mg via ORAL
  Filled 2012-02-22 (×4): qty 1

## 2012-02-22 MED ORDER — ENSURE COMPLETE PO LIQD
237.0000 mL | ORAL | Status: DC
  Administered 2012-02-23: 237 mL via ORAL
  Filled 2012-02-22: qty 237

## 2012-02-22 MED ORDER — SODIUM CHLORIDE 0.9 % IV SOLN
INTRAVENOUS | Status: DC
  Administered 2012-02-22 – 2012-02-24 (×2): via INTRAVENOUS

## 2012-02-22 MED ORDER — GABAPENTIN 100 MG PO CAPS
100.0000 mg | ORAL_CAPSULE | Freq: Two times a day (BID) | ORAL | Status: DC
  Administered 2012-02-23 – 2012-02-26 (×7): 100 mg via ORAL
  Filled 2012-02-22 (×10): qty 1

## 2012-02-22 MED ORDER — ONDANSETRON HCL 4 MG PO TABS: 4.0000 mg | ORAL_TABLET | Freq: Four times a day (QID) | ORAL | Status: DC | PRN

## 2012-02-22 MED ORDER — NITROGLYCERIN 0.2 MG/HR TD PT24
0.2000 mg | MEDICATED_PATCH | Freq: Every day | TRANSDERMAL | Status: DC
  Administered 2012-02-23: 0.2 mg via TRANSDERMAL
  Filled 2012-02-22: qty 1

## 2012-02-22 MED ORDER — ACETAMINOPHEN 325 MG PO TABS
650.0000 mg | ORAL_TABLET | Freq: Once | ORAL | Status: AC
  Administered 2012-02-22: 650 mg via ORAL
  Filled 2012-02-22: qty 2

## 2012-02-22 MED ORDER — LEVOTHYROXINE SODIUM 50 MCG PO TABS
50.0000 ug | ORAL_TABLET | Freq: Every day | ORAL | Status: DC
  Administered 2012-02-23 – 2012-02-26 (×4): 50 ug via ORAL
  Filled 2012-02-22 (×5): qty 1

## 2012-02-22 NOTE — H&P (Signed)
[facility] History and Physical  [name] YNW:[id number] DOB: [birth year] DOA: 02/22/2012  Referring physician: Laray Anger, DO PCP: Willey Blade, MD   Chief Complaint: UTI  HPI: [name] is a 79 y.o. female with past medical history of hypertension, diabetes mellitus and hypothyroidism. Patient came in to the hospital because of fever and chills. Her daughter was at bedside at the time of this interview. Patient started complaining about burning while passing urine since yesterday, today she has fever and chills. Her expected her to have UTI so she took urine sample to Dr. Diamantina Providence office. It was believed to have UTI so patient was asked to come to the ED for further evaluation. On initial evaluation in the emergency department showed urinalysis consistent with UTI with TNTC puss cells. Patient also has fever of 101.4. Patient to be admitted to the hospital for further evaluation.  Review of Systems:  Constitutional: hasfevers and sweats Eyes: negative for irritation, redness and visual disturbance Ears, nose, mouth, throat, and face: negative for earaches, epistaxis, nasal congestion and sore throat Respiratory: negative for cough, dyspnea on exertion, sputum and wheezing Cardiovascular: negative for chest pain, dyspnea, lower extremity edema, orthopnea, palpitations and syncope Gastrointestinal: negative for abdominal pain, constipation, diarrhea, melena, nausea and vomiting Genitourinary: has dysuria Hematologic/lymphatic: negative for bleeding, easy bruising and lymphadenopathy Musculoskeletal:negative for arthralgias, muscle weakness and stiff joints Neurological: negative for coordination problems, gait problems, headaches and weakness Endocrine: negative for diabetic symptoms including polydipsia, polyuria and weight loss Allergic/Immunologic: negative for anaphylaxis, hay fever and urticaria   Past Medical History  Diagnosis Date  . Diabetes mellitus    . Hypertension   . MI (myocardial infarction)   . Coronary artery disease   . Legally blind    Past Surgical History  Procedure Date  . Pacemaker insertion   . Coronary artery bypass graft   . Cardiac catheterization   . Esophagogastroduodenoscopy 12/22/2011    Procedure: ESOPHAGOGASTRODUODENOSCOPY (EGD);  Surgeon: Iva Boop, MD;  Location: [facility];  Service: Endoscopy;  Laterality: N/A;   Social History:  reports that she has never smoked. She has never used smokeless tobacco. She reports that she does not drink alcohol or use illicit drugs.  Allergies  Allergen Reactions  . Darvon   . Other     Tylenol #3  . Penicillins Other (See Comments)    Was told had allergy from childhood... Unknown reaction  . Percocet (Oxycodone-Acetaminophen)   . Percodan (Oxycodone-Aspirin)   . Vicodin (Hydrocodone-Acetaminophen)     Family History  Problem Relation Age of Onset  . Breast cancer Daughter   . Diabetes Son   . Diabetes Daughter   . Heart disease Father   . Diabetes Father   . Colon polyps Daughter     Prior to Admission medications   Medication Sig Start Date End Date Taking? Authorizing Provider  aspirin 81 MG chewable tablet Chew 81 mg by mouth daily.   Yes Historical Provider, MD  feeding supplement (ENSURE COMPLETE) LIQD Take 237 mLs by mouth daily. 12/26/11  Yes Lizbeth Bark, FNP  ferrous sulfate 325 (65 FE) MG tablet Take 325 mg by mouth daily with breakfast.   Yes Historical Provider, MD  folic acid (FOLVITE) 1 MG tablet Take 1 mg by mouth daily.   Yes Historical Provider, MD  gabapentin (NEURONTIN) 100 MG capsule Take 100 mg by mouth 2 (two) times daily.    Yes Historical Provider, MD  levothyroxine (SYNTHROID, LEVOTHROID) 50 MCG  tablet Take 50 mcg by mouth daily.   Yes Historical Provider, MD  loratadine (CLARITIN) 10 MG tablet Take 10 mg by mouth daily. For allergies   Yes Historical Provider, MD  metoprolol tartrate (LOPRESSOR) 25 MG tablet Take 12.5 mg by  mouth 2 (two) times daily.   Yes Historical Provider, MD  nitroGLYCERIN (NITRODUR - DOSED IN MG/24 HR) 0.2 mg/hr Place 1 patch (0.2 mg total) onto the skin daily. 01/02/12  Yes Altha Harm, MD  pantoprazole (PROTONIX) 40 MG tablet Take 1 tablet (40 mg total) by mouth 2 (two) times daily before a meal. 12/26/11  Yes Lizbeth Bark, FNP  potassium chloride SA (K-DUR,KLOR-CON) 20 MEQ tablet Take 20 mEq by mouth daily.   Yes Historical Provider, MD  rosuvastatin (CRESTOR) 10 MG tablet Take 10 mg by mouth daily.   Yes Historical Provider, MD  torsemide (DEMADEX) 20 MG tablet Take 20 mg by mouth every other day.   Yes Historical Provider, MD  Vitamin D, Ergocalciferol, (DRISDOL) 50000 UNITS CAPS Take 50,000 Units by mouth every 7 (seven) days. Mondays   Yes Historical Provider, MD  zolpidem (AMBIEN) 10 MG tablet Take 5 mg by mouth at bedtime as needed. sleep   Yes Historical Provider, MD   Physical Exam: Filed Vitals:   02/22/12 2000 02/22/12 2100 02/22/12 2155 02/22/12 2243  BP:    104/69  Pulse:      Temp:   98.7 F (37.1 C)   TempSrc:   Oral   Resp: 24 17  18   SpO2:       General appearance: alert, cooperative and no distress  Head: Normocephalic, without obvious abnormality, atraumatic  Eyes: conjunctivae/corneas clear. PERRL, EOM's intact. Fundi benign.  Nose: Nares normal. Septum midline. Mucosa normal. No drainage or sinus tenderness.  Throat: lips, mucosa, and tongue normal; teeth and gums normal  Neck: Supple, no masses, no cervical lymphadenopathy, no JVD appreciated, no meningeal signs Resp: clear to auscultation bilaterally  Chest wall: no tenderness  Cardio: regular rate and rhythm, S1, S2 normal, no murmur, click, rub or gallop  GI: soft, non-tender; bowel sounds normal; no masses, no organomegaly  Extremities: extremities normal, atraumatic, no cyanosis or edema  Skin: Skin color, texture, turgor normal. No rashes or lesions   Neurologic: Alert and oriented X2, I think  she is confused a little bit and she was repeating words.   Labs on Admission:  Basic Metabolic Panel:  Lab 02/22/12 1610  NA 138  K 5.1  CL 106  CO2 --  GLUCOSE 160*  BUN 17  CREATININE 1.70*  CALCIUM --  MG --  PHOS --   Liver Function Tests: No results found for this basename: AST:5,ALT:5,ALKPHOS:5,BILITOT:5,PROT:5,ALBUMIN:5 in the last 168 hours No results found for this basename: LIPASE:5,AMYLASE:5 in the last 168 hours No results found for this basename: AMMONIA:5 in the last 168 hours CBC:  Lab 02/22/12 1919 02/22/12 1916  WBC 11.0* --  NEUTROABS 9.6* --  HGB 12.1 13.6  HCT 37.7 40.0  MCV 85.7 --  PLT 159 --   Cardiac Enzymes: No results found for this basename: CKTOTAL:5,CKMB:5,CKMBINDEX:5,TROPONINI:5 in the last 168 hours  BNP (last 3 results)  Basename 01/01/12 0435 12/17/11 1603 12/15/11 0650  PROBNP 2693.0* 2026.0* 1637.0*   CBG:  Lab 02/22/12 1743  GLUCAP 139*    Radiological Exams on Admission: Dg Chest 2 View  02/22/2012  *RADIOLOGY REPORT*  Clinical Data: Nausea, vomiting and weakness.  CHEST - 2 VIEW  Comparison: PA  and lateral chest 12/29/2011 and 06/05/2009.  Findings: Heart size is upper normal.  The patient is status post CABG with a pacing device in place.  Lungs are clear.  Elevation of the right hemidiaphragm is unchanged.  No pneumothorax or pleural fluid is identified.  IMPRESSION: No acute finding.   Original Report Authenticated By: Holley Dexter, M.D.     EKG: Independently reviewed.   Assessment/Plan Principal Problem:  *UTI (lower urinary tract infection) Active Problems:  Diabetes mellitus  Acute on chronic renal failure  Fever   UTI -Patient has UTI symptoms and urinalysis consistent with UTI. -Has history of ESBL Escherichia coli UTI, I will start empirically on Primaxin. -Urine culture sent, we will adjust antibiotics according to the culture results. -Hemodynamically stable, her lactate is normal.  Acute renal  failure -Baseline creatinine is 0.9, she came in with a creatinine of 1.7. -This is likely secondary to dehydration from poor oral intake and diuretics. -Started on IV fluids, her Demadex was held. -Reassess the need for IV fluids in the morning as patient has history of fluid overload and CHF.  Diabetes mellitus 2 -She has hemoglobin A1c of 7.7 last time, she is not on hypoglycemic agents. -Carbohydrate modified diet, insulin sliding scale. -Probably she needs to be started on oral hypoglycemic agent prior to discharge.  Recent history of anemia -Patient has recent history of an anemia and hemoglobin of 6.7 she was on Plavix and that was discontinued.  Code Status: Full code Family Communication: Daughter at bedside. Disposition Plan: Inpatient, she is going to need more than 2 midnights in the hospital.  Time spent: 70 minutes  [facility] Pager [phone].  If 7PM-7AM, please contact night-coverage [email or web address] Password [email or web address] 02/22/2012, 10:51 PM

## 2012-02-22 NOTE — ED Notes (Signed)
Hospitalist at bedside 

## 2012-02-22 NOTE — ED Provider Notes (Signed)
History     CSN: [id number]  Arrival date & time 02/22/12  1728   First MD Initiated Contact with Patient 02/22/12 1816      Chief Complaint  Patient presents with  . Fever  . Nausea  . Emesis    HPI Pt was seen at 1910.  Per pt and family, c/o gradual onset and persistence of multiple intermittent episodes of N/V that began this morning.  Has been associated with home fevers/chills.  Pt states she "brought a urine sample to my doctor's office" this morning and was told "I had a UTI." Pt states she was then told to come to the ED for further eval. Denies abd pain, no diarrhea, no CP/SOB, no cough, no back pain, no black or blood in stools or emesis.    Past Medical History  Diagnosis Date  . Diabetes mellitus   . Hypertension   . MI (myocardial infarction)   . Coronary artery disease   . Legally blind     Past Surgical History  Procedure Date  . Pacemaker insertion   . Coronary artery bypass graft   . Cardiac catheterization   . Esophagogastroduodenoscopy 12/22/2011    Procedure: ESOPHAGOGASTRODUODENOSCOPY (EGD);  Surgeon: Iva Boop, MD;  Location: [facility];  Service: Endoscopy;  Laterality: N/A;    Family History  Problem Relation Age of Onset  . Breast cancer Daughter   . Diabetes Son   . Diabetes Daughter   . Heart disease Father   . Diabetes Father   . Colon polyps Daughter     History  Substance Use Topics  . Smoking status: Never Smoker   . Smokeless tobacco: Never Used  . Alcohol Use: No    Review of Systems ROS: Statement: All systems negative except as marked or noted in the HPI; Constitutional: +fever and chills. ; ; Eyes: Negative for eye pain, redness and discharge. ; ; ENMT: Negative for ear pain, hoarseness, nasal congestion, sinus pressure and sore throat. ; ; Cardiovascular: Negative for chest pain, palpitations, diaphoresis, dyspnea and peripheral edema. ; ; Respiratory: Negative for cough, wheezing and stridor. ; ; Gastrointestinal:  +N/V. Negative for diarrhea, abdominal pain, blood in stool, hematemesis, jaundice and rectal bleeding. . ; ; Genitourinary: Negative for dysuria, flank pain and hematuria. ; ; Musculoskeletal: Negative for back pain and neck pain. Negative for swelling and trauma.; ; Skin: Negative for pruritus, rash, abrasions, blisters, bruising and skin lesion.; ; Neuro: Negative for headache, lightheadedness and neck stiffness. Negative for weakness, altered level of consciousness , altered mental status, extremity weakness, paresthesias, involuntary movement, seizure and syncope.       Allergies  Darvon; Other; Penicillins; Percocet; Percodan; and Vicodin  Home Medications   Current Outpatient Rx  Name  Route  Sig  Dispense  Refill  . ASPIRIN 81 MG PO CHEW   Oral   Chew 81 mg by mouth daily.         Marland Kitchen ENSURE COMPLETE PO LIQD   Oral   Take 237 mLs by mouth daily.         Marland Kitchen FERROUS SULFATE 325 (65 FE) MG PO TABS   Oral   Take 325 mg by mouth daily with breakfast.         . FOLIC ACID 1 MG PO TABS   Oral   Take 1 mg by mouth daily.         Marland Kitchen GABAPENTIN 100 MG PO CAPS   Oral   Take 100 mg  by mouth 2 (two) times daily.          Marland Kitchen LEVOTHYROXINE SODIUM 50 MCG PO TABS   Oral   Take 50 mcg by mouth daily.         Marland Kitchen LORATADINE 10 MG PO TABS   Oral   Take 10 mg by mouth daily. For allergies         . METOPROLOL TARTRATE 25 MG PO TABS   Oral   Take 12.5 mg by mouth 2 (two) times daily.         Marland Kitchen NITROGLYCERIN 0.2 MG/HR TD PT24   Transdermal   Place 1 patch (0.2 mg total) onto the skin daily.   30 patch   0   . PANTOPRAZOLE SODIUM 40 MG PO TBEC   Oral   Take 1 tablet (40 mg total) by mouth 2 (two) times daily before a meal.   60 tablet   1   . POTASSIUM CHLORIDE CRYS ER 20 MEQ PO TBCR   Oral   Take 20 mEq by mouth daily.         Marland Kitchen ROSUVASTATIN CALCIUM 10 MG PO TABS   Oral   Take 10 mg by mouth daily.         . TORSEMIDE 20 MG PO TABS   Oral   Take 20 mg  by mouth every other day.         Marland Kitchen VITAMIN D (ERGOCALCIFEROL) 50000 UNITS PO CAPS   Oral   Take 50,000 Units by mouth every 7 (seven) days. Mondays         . ZOLPIDEM TARTRATE 10 MG PO TABS   Oral   Take 5 mg by mouth at bedtime as needed. sleep           BP 140/74  Pulse 110  Temp 98.7 F (37.1 C) (Oral)  Resp 17  SpO2 94%  Physical Exam 1915: Physical examination:  Nursing notes reviewed; Vital signs and O2 SAT reviewed;  Constitutional: Well developed, Well nourished, In no acute distress; Head:  Normocephalic, atraumatic; Eyes: EOMI, PERRL, No scleral icterus; ENMT: Mouth and pharynx normal, Mucous membranes dry; Neck: Supple, Full range of motion, No lymphadenopathy; Cardiovascular: Regular rate and rhythm, No gallop; Respiratory: Breath sounds clear & equal bilaterally, No wheezes.  Speaking full sentences with ease, Normal respiratory effort/excursion; Chest: Nontender, Movement normal; Abdomen: Soft, Nontender, Nondistended, Normal bowel sounds; Genitourinary: No CVA tenderness; Extremities: Pulses normal, No tenderness, No edema, No calf edema or asymmetry.; Neuro: AA&Ox3, +hx blindness, otherwise major CN grossly intact.  Speech clear. No gross focal motor or sensory deficits in extremities.; Skin: Color normal, Warm, Dry.   ED Course  Procedures      MDM  MDM Reviewed: nursing note, vitals and previous chart Reviewed previous: labs Interpretation: labs and x-ray   Results for orders placed during the hospital encounter of 02/22/12  URINALYSIS, ROUTINE W REFLEX MICROSCOPIC      Component Value Range   Color, Urine YELLOW  YELLOW   APPearance TURBID (*) CLEAR   Specific Gravity, Urine 1.012  1.005 - 1.030   pH 6.0  5.0 - 8.0   Glucose, UA NEGATIVE  NEGATIVE mg/dL   Hgb urine dipstick MODERATE (*) NEGATIVE   Bilirubin Urine NEGATIVE  NEGATIVE   Ketones, ur NEGATIVE  NEGATIVE mg/dL   Protein, ur 30 (*) NEGATIVE mg/dL   Urobilinogen, UA 0.2  0.0 - 1.0  mg/dL   Nitrite NEGATIVE  NEGATIVE   Leukocytes,  UA LARGE (*) NEGATIVE  GLUCOSE, CAPILLARY      Component Value Range   Glucose-Capillary 139 (*) 70 - 99 mg/dL   Comment 1 Documented in Chart     Comment 2 Notify RN    LACTIC ACID, PLASMA      Component Value Range   Lactic Acid, Venous 1.6  0.5 - 2.2 mmol/L  CBC WITH DIFFERENTIAL      Component Value Range   WBC 11.0 (*) 4.0 - 10.5 K/uL   RBC 4.40  3.87 - 5.11 MIL/uL   Hemoglobin 12.1  12.0 - 15.0 g/dL   HCT 56.2  13.0 - 86.5 %   MCV 85.7  78.0 - 100.0 fL   MCH 27.5  26.0 - 34.0 pg   MCHC 32.1  30.0 - 36.0 g/dL   RDW 78.4 (*) 69.6 - 29.5 %   Platelets 159  150 - 400 K/uL   Neutrophils Relative 87 (*) 43 - 77 %   Neutro Abs 9.6 (*) 1.7 - 7.7 K/uL   Lymphocytes Relative 5 (*) 12 - 46 %   Lymphs Abs 0.5 (*) 0.7 - 4.0 K/uL   Monocytes Relative 8  3 - 12 %   Monocytes Absolute 0.8  0.1 - 1.0 K/uL   Eosinophils Relative 0  0 - 5 %   Eosinophils Absolute 0.0  0.0 - 0.7 K/uL   Basophils Relative 0  0 - 1 %   Basophils Absolute 0.0  0.0 - 0.1 K/uL  POCT I-STAT, CHEM 8      Component Value Range   Sodium 138  135 - 145 mEq/L   Potassium 5.1  3.5 - 5.1 mEq/L   Chloride 106  96 - 112 mEq/L   BUN 17  6 - 23 mg/dL   Creatinine, Ser 2.84 (*) 0.50 - 1.10 mg/dL   Glucose, Bld 132 (*) 70 - 99 mg/dL   Calcium, Ion 4.40  1.02 - 1.30 mmol/L   TCO2 26  0 - 100 mmol/L   Hemoglobin 13.6  12.0 - 15.0 g/dL   HCT 72.5  36.6 - 44.0 %  URINE MICROSCOPIC-ADD ON      Component Value Range   Squamous Epithelial / LPF RARE  RARE   WBC, UA TOO NUMEROUS TO COUNT  <3 WBC/hpf   Bacteria, UA MANY (*) RARE   Urine-Other FIELD OBSCURED BY WBC'S     Dg Chest 2 View 02/22/2012  *RADIOLOGY REPORT*  Clinical Data: Nausea, vomiting and weakness.  CHEST - 2 VIEW  Comparison: PA and lateral chest 12/29/2011 and 06/05/2009.  Findings: Heart size is upper normal.  The patient is status post CABG with a pacing device in place.  Lungs are clear.  Elevation of the  right hemidiaphragm is unchanged.  No pneumothorax or pleural fluid is identified.  IMPRESSION: No acute finding.   Original Report Authenticated By: Holley Dexter, M.D.     Results for ROBIE, OATS (MRN [id number]) as of 02/22/2012 22:24  Ref. Range 01/03/2012 05:15 02/22/2012 19:16  BUN Latest Range: 6-23 mg/dL 10 17  Creatinine Latest Range: 0.50-1.10 mg/dL 3.87 5.64 (H)      3329:  +UTI, UC pending.  Will start IV cipro.  BUN/Cr elevated from previous, will give judicious IVF.  Fever improved after tylenol. Dx and testing d/w pt and family.  Questions answered.  Verb understanding, agreeable to admit.  T/C to [facility]. Arthor Captain, case discussed, including:  HPI, pertinent PM/SHx, VS/PE, dx testing, ED course and  treatment:  Agreeable to admit, requests to obtain medical bed to team 8.      Laray Anger, DO 02/23/12 2135

## 2012-02-22 NOTE — ED Notes (Signed)
Pt was at md office yesterday dx with blood in urine and today had n/v and a fever. Pt alert 20g lt ac ns  cbg 115, pt is from home lives with family.

## 2012-02-22 NOTE — ED Notes (Signed)
Bed:WA20<BR> Expected date:<BR> Expected time:<BR> Means of arrival:<BR> Comments:<BR> Hold for Hall G

## 2012-02-23 ENCOUNTER — Encounter (HOSPITAL_COMMUNITY): Payer: Self-pay

## 2012-02-23 DIAGNOSIS — R634 Abnormal weight loss: Secondary | ICD-10-CM

## 2012-02-23 DIAGNOSIS — A499 Bacterial infection, unspecified: Secondary | ICD-10-CM

## 2012-02-23 DIAGNOSIS — I959 Hypotension, unspecified: Secondary | ICD-10-CM

## 2012-02-23 DIAGNOSIS — D638 Anemia in other chronic diseases classified elsewhere: Secondary | ICD-10-CM

## 2012-02-23 DIAGNOSIS — B9689 Other specified bacterial agents as the cause of diseases classified elsewhere: Secondary | ICD-10-CM

## 2012-02-23 LAB — CBC
MCHC: 31.8 g/dL (ref 30.0–36.0)
Platelets: 146 10*3/uL — ABNORMAL LOW (ref 150–400)
RDW: 19.1 % — ABNORMAL HIGH (ref 11.5–15.5)
WBC: 11.7 10*3/uL — ABNORMAL HIGH (ref 4.0–10.5)

## 2012-02-23 LAB — HEMOGLOBIN A1C: Mean Plasma Glucose: 148 mg/dL — ABNORMAL HIGH (ref <117)

## 2012-02-23 LAB — BASIC METABOLIC PANEL
Calcium: 8.7 mg/dL (ref 8.4–10.5)
Creatinine, Ser: 1.84 mg/dL — ABNORMAL HIGH (ref 0.50–1.10)
GFR calc Af Amer: 27 mL/min — ABNORMAL LOW (ref >90)
GFR calc non Af Amer: 23 mL/min — ABNORMAL LOW (ref >90)

## 2012-02-23 LAB — TSH: TSH: 1.688 u[IU]/mL (ref 0.350–4.500)

## 2012-02-23 LAB — GLUCOSE, CAPILLARY: Glucose-Capillary: 150 mg/dL — ABNORMAL HIGH (ref 70–99)

## 2012-02-23 MED ORDER — SODIUM CHLORIDE 0.9 % IV SOLN
250.0000 mg | Freq: Four times a day (QID) | INTRAVENOUS | Status: DC
  Administered 2012-02-23 – 2012-02-26 (×13): 250 mg via INTRAVENOUS
  Filled 2012-02-23 (×17): qty 250

## 2012-02-23 MED ORDER — PANTOPRAZOLE SODIUM 40 MG PO TBEC
40.0000 mg | DELAYED_RELEASE_TABLET | Freq: Two times a day (BID) | ORAL | Status: DC
  Administered 2012-02-24 – 2012-02-26 (×5): 40 mg via ORAL
  Filled 2012-02-23 (×6): qty 1

## 2012-02-23 MED ORDER — GLUCERNA SHAKE PO LIQD
237.0000 mL | Freq: Two times a day (BID) | ORAL | Status: DC
  Administered 2012-02-23: 237 mL via ORAL
  Filled 2012-02-23: qty 237

## 2012-02-23 MED ORDER — SODIUM CHLORIDE 0.9 % IV BOLUS (SEPSIS)
500.0000 mL | Freq: Once | INTRAVENOUS | Status: AC
  Administered 2012-02-23: 500 mL via INTRAVENOUS

## 2012-02-23 MED ORDER — GLUCERNA SHAKE PO LIQD
237.0000 mL | Freq: Three times a day (TID) | ORAL | Status: DC
  Administered 2012-02-23 – 2012-02-26 (×9): 237 mL via ORAL
  Filled 2012-02-23 (×11): qty 237

## 2012-02-23 NOTE — Progress Notes (Signed)
[facility] PROGRESS NOTE  [name] OZH:[id number] DOB: 1920/07/09 DOA: 02/22/2012 PCP: August Saucer [name], MD  Assessment/Plan:  Hypotension - Patient recently had nitro patch placed, will d/c nitro paste - Place holding parameters on B blocker - will administer fluid bolus - recheck blood pressures after fluid bolus  UTI  -Patient has UTI symptoms and urinalysis consistent with UTI.  -Has history of ESBL Escherichia coli UTI, I will start empirically on Primaxin.  -Urine culture sent, we will adjust antibiotics according to the culture results.  -Hemodynamically stable, her lactate is normal.   Acute renal failure  -Baseline creatinine is 0.9, she came in with a creatinine of 1.7 and today her creatinine slightly elevated at 1.84 -This is likely secondary to dehydration from poor oral intake and diuretics.  -Continue IV fluids, continue to hold demadex.  -Reassess the need for IV fluids in the morning as patient has history of fluid overload and CHF. Discussed with nursing.  Diabetes mellitus 2  -She has hemoglobin A1c of 6.8, she is not on hypoglycemic agents.  -Carbohydrate modified diet, insulin sliding scale.  -Probably she needs to be started on oral hypoglycemic agent prior to discharge.   Recent history of anemia  -Patient has recent history of an anemia and hemoglobin of 6.7 she was on Plavix and that was discontinued.   Code Status: full Family Communication: None at bedside Disposition Plan: Pending further improvement in condition.   Consultants:  none  Procedures:  none  Antibiotics:  Primaxin  HPI/Subjective: Patient reports that she feels better today.  Was having some headache today but otherwise no new complaints.  Nursing reports that patient had low blood pressure after recent nitro administration.  Have discussed holding nitro and placed holding parameters on B blocker.  Objective: Filed Vitals:   02/22/12 2334 02/23/12 0557 02/23/12  1048 02/23/12 1410  BP: 89/58 107/70 137/84 73/48  Pulse: 82 75 103 85  Temp: 98.4 F (36.9 C) 97.9 F (36.6 C)  98.9 F (37.2 C)  TempSrc: Oral Oral  Oral  Resp: 16 16  18   Height:      Weight:      SpO2: 94% 96% 96% 93%    Intake/Output Summary (Last 24 hours) at 02/23/12 1513 Last data filed at 02/23/12 0813  Gross per 24 hour  Intake      0 ml  Output    125 ml  Net   -125 ml   Filed Weights   02/22/12 2300  Weight: 81.8 kg (180 lb 5.4 oz)    Exam:   General:  Pt in NAD, Alert and Awake  Cardiovascular: RRR, No MRG  Respiratory: CTA BL, no wheezes, no increased WOB  Abdomen: soft, NT, ND  Data Reviewed: Basic Metabolic Panel:  Lab 02/23/12 6295 02/22/12 1916  NA 135 138  K 5.6* 5.1  CL 103 106  CO2 25 --  GLUCOSE 173* 160*  BUN 18 17  CREATININE 1.84* 1.70*  CALCIUM 8.7 --  MG -- --  PHOS -- --   Liver Function Tests: No results found for this basename: AST:5,ALT:5,ALKPHOS:5,BILITOT:5,PROT:5,ALBUMIN:5 in the last 168 hours No results found for this basename: LIPASE:5,AMYLASE:5 in the last 168 hours No results found for this basename: AMMONIA:5 in the last 168 hours CBC:  Lab 02/23/12 0445 02/22/12 1919 02/22/12 1916  WBC 11.7* 11.0* --  NEUTROABS -- 9.6* --  HGB 10.8* 12.1 13.6  HCT 34.0* 37.7 40.0  MCV 86.1 85.7 --  PLT 146* 159 --  Cardiac Enzymes: No results found for this basename: CKTOTAL:5,CKMB:5,CKMBINDEX:5,TROPONINI:5 in the last 168 hours BNP (last 3 results)  Basename 01/01/12 0435 12/17/11 1603 12/15/11 0650  PROBNP 2693.0* 2026.0* 1637.0*   CBG:  Lab 02/23/12 1056 02/23/12 0742 02/22/12 2304 02/22/12 1743  GLUCAP 126* 130* 198* 139*    No results found for this or any previous visit (from the past 240 hour(s)).   Studies: Dg Chest 2 View  02/22/2012  *RADIOLOGY REPORT*  Clinical Data: Nausea, vomiting and weakness.  CHEST - 2 VIEW  Comparison: PA and lateral chest 12/29/2011 and 06/05/2009.  Findings: Heart size is  upper normal.  The patient is status post CABG with a pacing device in place.  Lungs are clear.  Elevation of the right hemidiaphragm is unchanged.  No pneumothorax or pleural fluid is identified.  IMPRESSION: No acute finding.   Original Report Authenticated By: Holley Dexter, M.D.     Scheduled Meds:   . aspirin  81 mg Oral Daily  . atorvastatin  40 mg Oral q1800  . feeding supplement  237 mL Oral BID BM  . ferrous sulfate  325 mg Oral Q breakfast  . folic acid  1 mg Oral Daily  . gabapentin  100 mg Oral BID  . heparin  5,000 Units Subcutaneous Q8H  . imipenem-cilastatin  250 mg Intravenous Q6H  . insulin aspart  0-9 Units Subcutaneous TID WC  . levothyroxine  50 mcg Oral QAC breakfast  . loratadine  10 mg Oral Daily  . metoprolol tartrate  12.5 mg Oral BID  . pantoprazole  40 mg Oral BID AC  . potassium chloride SA  20 mEq Oral Daily  . sodium chloride  500 mL Intravenous Once  . Vitamin D (Ergocalciferol)  50,000 Units Oral Q7 days   Continuous Infusions:   . sodium chloride    . sodium chloride 75 mL/hr at 02/22/12 2245    Principal Problem:  *UTI (lower urinary tract infection) Active Problems:  Diabetes mellitus  Hypotension  Acute on chronic renal failure  Fever    Time spent: > 35 minutes    [name]  [facility] Pager [phone] If 8PM-8AM, please contact night-coverage at [email or web address], password [facility] 02/23/2012, 3:13 PM  LOS: 1 day

## 2012-02-23 NOTE — Progress Notes (Signed)
ANTIBIOTIC CONSULT NOTE - INITIAL  Pharmacy Consult for Imipenem  Indication: UTI, history of ESBL   Allergies  Allergen Reactions  . Darvon   . Other     Tylenol #3  . Penicillins Other (See Comments)    Was told had allergy from childhood... Unknown reaction  . Percocet (Oxycodone-Acetaminophen)   . Percodan (Oxycodone-Aspirin)   . Vicodin (Hydrocodone-Acetaminophen)     Patient Measurements: Height: 5\' 4"  (162.6 cm) Weight: 180 lb 5.4 oz (81.8 kg) IBW/kg (Calculated) : 54.7  Adjusted Body Weight:   Vital Signs: Temp: 98.4 F (36.9 C) (02/06 2334) Temp src: Oral (02/06 2334) BP: 89/58 mmHg (02/06 2334) Pulse Rate: 82  (02/06 2334) Intake/Output from previous day:   Intake/Output from this shift:    Labs:  [facility] 02/22/12 1919 02/22/12 1916  WBC 11.0* --  HGB 12.1 13.6  PLT 159 --  LABCREA -- --  CREATININE -- 1.70*   Estimated Creatinine Clearance: 23.2 ml/min (by C-G formula based on Cr of 1.7). No results found for this basename: VANCOTROUGH:2,VANCOPEAK:2,VANCORANDOM:2,GENTTROUGH:2,GENTPEAK:2,GENTRANDOM:2,TOBRATROUGH:2,TOBRAPEAK:2,TOBRARND:2,AMIKACINPEAK:2,AMIKACINTROU:2,AMIKACIN:2, in the last 72 hours   Microbiology: No results found for this or any previous visit (from the past 720 hour(s)).  Medical History: Past Medical History  Diagnosis Date  . Diabetes mellitus   . Hypertension   . MI (myocardial infarction)   . Coronary artery disease   . Legally blind     Medications:  Anti-infectives     Start     Dose/Rate Route Frequency Ordered Stop   02/23/12 0600   imipenem-cilastatin (PRIMAXIN) 250 mg in sodium chloride 0.9 % 100 mL IVPB        250 mg 200 mL/hr over 30 Minutes Intravenous 4 times per day 02/23/12 0505     02/22/12 2300   imipenem-cilastatin (PRIMAXIN) 500 mg in sodium chloride 0.9 % 100 mL IVPB        500 mg 200 mL/hr over 30 Minutes Intravenous  Once 02/22/12 2252 02/23/12 0045   02/22/12 2215   ciprofloxacin (CIPRO) IVPB  400 mg        400 mg 200 mL/hr over 60 Minutes Intravenous  Once 02/22/12 2215 02/22/12 2346         Assessment: Patient with UTI, history of ESBL.  Patient also has poor renal function at this time. First dose of antibiotics already given.   Goal of Therapy:  Primaxin dosed based on patient weight and renal function   Plan:  Follow up culture results Primaxin 250mg  iv q6hr  Aleene Davidson Crowford 02/23/2012,5:06 AM

## 2012-02-23 NOTE — Progress Notes (Signed)
Patient's repeat blood pressure after receiving 500cc normal saline bolus 82/60, patient asymptomatic. Paged Dr. Cena Benton with this information.  Awaiting further orders.  Will monitor.

## 2012-02-23 NOTE — Evaluation (Signed)
Occupational Therapy Evaluation Patient Details Name: [name] MRN: [id number] DOB: 04/27/1920 Today's Date: 02/23/2012 Time: 9629-5284 OT Time Calculation (min): 28 min  OT Assessment / Plan / Recommendation Clinical Impression  Pt presents to hospital s/p admit to hospital with UTI.  Pt presents to OT with significant dependence with ADL activity and will beneifit from OT to increase I with simple ADL activity and decrease burden on family    OT Assessment  Patient needs continued OT Services    Follow Up Recommendations  Home health OT       Equipment Recommendations  None recommended by OT       Frequency  Min 2X/week    Precautions / Restrictions Precautions Precautions: Fall Precaution Comments: Pt spends most of time in bed Restrictions Weight Bearing Restrictions: No       ADL  Eating/Feeding: Simulated;Maximal assistance;Other (comment) (pt blind and unable to see food) Where Assessed - Eating/Feeding: Edge of bed Grooming: Simulated;Minimal assistance Where Assessed - Grooming: Unsupported sitting Upper Body Bathing: Simulated;Moderate assistance Where Assessed - Upper Body Bathing: Unsupported sitting Lower Body Bathing: Simulated;+1 Total assistance Where Assessed - Lower Body Bathing: Lean right and/or left Upper Body Dressing: Simulated;Moderate assistance Where Assessed - Upper Body Dressing: Unsupported sitting Lower Body Dressing: Simulated;+1 Total assistance Where Assessed - Lower Body Dressing: Lean right and/or left Transfers/Ambulation Related to ADLs: Pt did sit EOB, but did not stand with OT.  Son did report PT was seeing pt in home and was working on standing ADL Comments: Pt has 24/7 assist in which she does receive help with all ADL activity    OT Diagnosis: Generalized weakness  OT Problem List: Decreased strength;Decreased activity tolerance;Impaired balance (sitting and/or standing) OT Treatment Interventions: Self-care/ADL  training;Patient/family education   OT Goals Acute Rehab OT Goals Time For Goal Achievement: 03/08/12 Potential to Achieve Goals: Good ADL Goals Pt Will Perform Grooming: with set-up;Sitting, edge of bed ADL Goal: Grooming - Progress: Goal set today Pt Will Perform Upper Body Bathing: with set-up;Sitting, edge of bed ADL Goal: Upper Body Bathing - Progress: Goal set today  Visit Information  Last OT Received On: 02/23/12    Subjective Data  Subjective: I can try to sit up   Prior Functioning     Home Living Lives With: Family;Son;Daughter Available Help at Discharge: Family Type of Home: House Home Access: Level entry Home Layout: One level Bathroom Shower/Tub: Other (comment) (pt does bath in wheel chair) Home Adaptive Equipment: Wheelchair - manual;Walker - standard;Bedside commode/3-in-1 Additional Comments: Pt does sit in WC 45 min after each meal, but other than that she is in bed Prior Function Level of Independence: Needs assistance Needs Assistance: Bathing;Dressing;Feeding;Grooming;Toileting Comments: 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 Communication Communication: HOH         Vision/Perception Vision - History Baseline Vision: Legally blind Patient Visual Report: No change from baseline Vision - Assessment Additional Comments: pt sees shadows out of one eye   Cognition  Cognition Overall Cognitive Status: Appears within functional limits for tasks assessed/performed Arousal/Alertness: Awake/alert Orientation Level: Appears intact for tasks assessed Behavior During Session: [facility] for tasks performed    Extremity/Trunk Assessment Right Upper Extremity Assessment RUE ROM/Strength/Tone: Deficits RUE ROM/Strength/Tone Deficits: 0-90 degrees AROM.Pt able to get to hair and face, etc RUE Sensation: Deficits RUE Sensation Deficits: 0-90 degrees AROM . Pt able to get to hair and face     Mobility Bed Mobility Bed Mobility: Rolling Left;Supine to  Sit;Sitting - Scoot to  Edge of Bed Rolling Left: 3: Mod assist Supine to Sit: 1: +1 Total assist Sitting - Scoot to Edge of Bed: 2: Max assist Transfers Transfers: Not assessed        Balance High Level Balance High Level Balance Comments: Pt initially with posterior lean in sitting, but then was able to maintain sitting EOB with S only with increased time   End of Session OT - End of Session Activity Tolerance: Patient tolerated treatment well Patient left: in bed;with call bell/phone within reach;with family/visitor present  GO     [name], [place] Kung 02/23/2012, 10:39 AM

## 2012-02-23 NOTE — Progress Notes (Signed)
PT Cancellation Note  Patient Details Name: TYIANNA MENEFEE MRN: [id number] DOB: 03-26-1920   Cancelled Treatment:    Reason Eval/Treat Not Completed: Pain limiting ability to participate (pt c/o head and neck pain; hot pack applied, RN notified) Will follow.    Ralene Bathe Kistler 02/23/2012, 12:04 PM [phone]

## 2012-02-23 NOTE — Progress Notes (Signed)
Patient's blood pressure 80/50 manually.  Patient is asymptomatic.  Dr. Cena Benton aware.  Patient given 500 cc bolus, discontinued nitro patch to left arm.  Will monitor blood pressure.

## 2012-02-23 NOTE — Progress Notes (Signed)
Patients daughter stated that she called the nurses station at 0300 due to her mother wheezing, but no response given.  I was not made aware, patient resting lung sounds clear, no wheezing, will continue to monitor

## 2012-02-23 NOTE — Progress Notes (Signed)
INITIAL NUTRITION ASSESSMENT  DOCUMENTATION CODES Per approved criteria  -Severe  malnutrition in the context of chronic illness -Obesity Unspecified   INTERVENTION: 1. Glucerna shakes TID. Each supplement provides 220 kcal and 9.9 grams of protein.  2.  GOC; recommend GOC meeting to better define nutrition care plan in setting of general decline, wt loss, and poor nutrition status. A GOC meeting was held in December of 2013 with pt desiring full aggressive care.    NUTRITION DIAGNOSIS: Inadequate oral intake related to decreased appetite, N/V as evidenced by <50% intake of meals.   Goal: Pt to meet >/= 90% of estimated needs   Monitor:  PO intake, weight   Reason for Assessment: Malnutrition Screening Tool  79 y.o. female  Admitting Dx: UTI (lower urinary tract infection)  ASSESSMENT: RD spoke with pt, son and daughter.   Over the past 6 months, pt has had decreased appetite and lost roughly 30 lbs. Pt normally only eats 2 small meals a day.  Pt's son and daughter provide her with Glucerna shakes to drink.  Per pt, she drinks 1 - 1.5 Glucerna shakes a day.  Pt has also had problems with nausea and vomiting.  Sometimes pt is unable to eat enough without feeling too nauseous. Pt's daughter and son check on pt regularly for assistance. Pt's daughter and son express concern for pt's nutritional status. When speaking to pt, her son assisted her with lunch. Pt only consumed half a cup of soup, half her fruit and some Glucerna shake.     Pt's 11% weight loss and significant decreased intake meeting less than 75% of estimated needs meet the criteria for malnutrition in the context of chronic illness.     Height: Ht Readings from Last 1 Encounters:  02/22/12 5\' 4"  (1.626 m)    Weight: Wt Readings from Last 1 Encounters:  02/22/12 180 lb 5.4 oz (81.8 kg)    Ideal Body Weight: 120 lbs   % Ideal Body Weight: 150%  Wt Readings from Last 10 Encounters:  02/22/12 180 lb 5.4 oz (81.8  kg)  01/03/12 192 lb 14.4 oz (87.499 kg)  12/26/11 215 lb (97.523 kg)  12/26/11 215 lb (97.523 kg)  12/17/11 212 lb 1.3 oz (96.2 kg)  10/19/11 195 lb 12.8 oz (88.814 kg)  04/23/11 223 lb (101.152 kg)    Usual Body Weight: 200 - 204 lbs   % Usual Body Weight: 88-90%   BMI:  Body mass index is 30.95 kg/(m^2). - overweight   Estimated Nutritional Needs: Kcal: 1600 - 1800  Protein: 70 - 85 Fluid: 1.6 - 1.8   Skin: intact  Diet Order: Carb Control  EDUCATION NEEDS: -No education needs identified at this time   Intake/Output Summary (Last 24 hours) at 02/23/12 1701 Last data filed at 02/23/12 1612  Gross per 24 hour  Intake    600 ml  Output    125 ml  Net    475 ml    Last BM: 02/17/2012   Labs:   Lab 02/23/12 0445 02/22/12 1916  NA 135 138  K 5.6* 5.1  CL 103 106  CO2 25 --  BUN 18 17  CREATININE 1.84* 1.70*  CALCIUM 8.7 --  MG -- --  PHOS -- --  GLUCOSE 173* 160*    CBG (last 3)   Basename 02/23/12 1408 02/23/12 1056 02/23/12 0742  GLUCAP 150* 126* 130*    Scheduled Meds:    . aspirin  81 mg Oral Daily  .  atorvastatin  40 mg Oral q1800  . feeding supplement  237 mL Oral TID BM  . ferrous sulfate  325 mg Oral Q breakfast  . folic acid  1 mg Oral Daily  . gabapentin  100 mg Oral BID  . heparin  5,000 Units Subcutaneous Q8H  . imipenem-cilastatin  250 mg Intravenous Q6H  . insulin aspart  0-9 Units Subcutaneous TID WC  . levothyroxine  50 mcg Oral QAC breakfast  . loratadine  10 mg Oral Daily  . metoprolol tartrate  12.5 mg Oral BID  . pantoprazole  40 mg Oral BID AC  . potassium chloride SA  20 mEq Oral Daily  . sodium chloride  500 mL Intravenous Once  . Vitamin D (Ergocalciferol)  50,000 Units Oral Q7 days    Continuous Infusions:    . sodium chloride    . sodium chloride 75 mL/hr at 02/22/12 2245    Past Medical History  Diagnosis Date  . Diabetes mellitus   . Hypertension   . MI (myocardial infarction)   . Coronary artery  disease   . Legally blind     Past Surgical History  Procedure Date  . Pacemaker insertion   . Coronary artery bypass graft   . Cardiac catheterization   . Esophagogastroduodenoscopy 12/22/2011    Procedure: ESOPHAGOGASTRODUODENOSCOPY (EGD);  Surgeon: Iva Boop, MD;  Location: [facility];  Service: Endoscopy;  Laterality: N/A;   Loyce Dys, MS RD LDN Clinical Inpatient Dietitian Pager: [phone] Weekend/After hours pager: [phone]

## 2012-02-23 NOTE — Progress Notes (Signed)
Patients daughter explained that the patient has not had a bowel movement since 02-17-12

## 2012-02-23 NOTE — Progress Notes (Signed)
INITIAL NUTRITION ASSESSMENT  DOCUMENTATION CODES Per approved criteria  -Severe  malnutrition in the context of social or environmental circumstances -Obesity Unspecified   INTERVENTION: 1. Glucerna shakes TID. Each supplement provides 220 kcal and 9.9 grams of protein.   NUTRITION DIAGNOSIS: Inadequate oral intake related to decreased appetite, N/V as evidenced by <50% intake of meals.   Goal: Pt to meet >/= 90% of estimated needs   Monitor:  PO intake, weight   Reason for Assessment: Malnutrition Screening Tool (MST=)  79 y.o. female  Admitting Dx: UTI (lower urinary tract infection)  ASSESSMENT: RD spoke with pt, son and daughter.   Over the past 6 months, pt has had decreased appetite and lost roughly 30 lbs. Pt normally only eats 2 small meals a day.  Pt's son and daughter provide her with Glucerna shakes to drink.  Per pt, she drinks 1 - 1.5 Glucerna shakes a day.  Pt has also had problems with nausea and vomiting.  Sometimes pt is unable to eat enough without feeling too nauseous. Pt's daughter and son check on pt regularly to for assistance. Pt's daughter and son express concern for pt's nutritional status. When speaking to pt, her son assisted her with lunch. Pt only consumed half a cup of soup, half her fruit and some Glucerna shake.      Pt's 11% weight loss and significant decreased intake meet the criteria for malnutrition in the context of chronic illness.     Height: Ht Readings from Last 1 Encounters:  02/22/12 5\' 4"  (1.626 m)    Weight: Wt Readings from Last 1 Encounters:  02/22/12 180 lb 5.4 oz (81.8 kg)    Ideal Body Weight: 120 lbs   % Ideal Body Weight: 150%  Wt Readings from Last 10 Encounters:  02/22/12 180 lb 5.4 oz (81.8 kg)  01/03/12 192 lb 14.4 oz (87.499 kg)  12/26/11 215 lb (97.523 kg)  12/26/11 215 lb (97.523 kg)  12/17/11 212 lb 1.3 oz (96.2 kg)  10/19/11 195 lb 12.8 oz (88.814 kg)  04/23/11 223 lb (101.152 kg)    Usual Body  Weight: 200 - 204 lbs   % Usual Body Weight: 88-90%   BMI:  Body mass index is 30.95 kg/(m^2). - overweight   Estimated Nutritional Needs: Kcal: 1600 - 1800  Protein: 70 - 85 Fluid: 1.6 - 1.8   Skin: intact  Diet Order: Carb Control  EDUCATION NEEDS: -No education needs identified at this time   Intake/Output Summary (Last 24 hours) at 02/23/12 1508 Last data filed at 02/23/12 0813  Gross per 24 hour  Intake      0 ml  Output    125 ml  Net   -125 ml    Last BM: 02/17/2012   Labs:   Lab 02/23/12 0445 02/22/12 1916  NA 135 138  K 5.6* 5.1  CL 103 106  CO2 25 --  BUN 18 17  CREATININE 1.84* 1.70*  CALCIUM 8.7 --  MG -- --  PHOS -- --  GLUCOSE 173* 160*    CBG (last 3)   Basename 02/23/12 1056 02/23/12 0742 02/22/12 2304  GLUCAP 126* 130* 198*    Scheduled Meds:   . aspirin  81 mg Oral Daily  . atorvastatin  40 mg Oral q1800  . feeding supplement  237 mL Oral BID BM  . ferrous sulfate  325 mg Oral Q breakfast  . folic acid  1 mg Oral Daily  . gabapentin  100 mg Oral  BID  . heparin  5,000 Units Subcutaneous Q8H  . imipenem-cilastatin  250 mg Intravenous Q6H  . insulin aspart  0-9 Units Subcutaneous TID WC  . levothyroxine  50 mcg Oral QAC breakfast  . loratadine  10 mg Oral Daily  . metoprolol tartrate  12.5 mg Oral BID  . pantoprazole  40 mg Oral BID AC  . potassium chloride SA  20 mEq Oral Daily  . sodium chloride  500 mL Intravenous Once  . Vitamin D (Ergocalciferol)  50,000 Units Oral Q7 days    Continuous Infusions:   . sodium chloride    . sodium chloride 75 mL/hr at 02/22/12 2245    Past Medical History  Diagnosis Date  . Diabetes mellitus   . Hypertension   . MI (myocardial infarction)   . Coronary artery disease   . Legally blind     Past Surgical History  Procedure Date  . Pacemaker insertion   . Coronary artery bypass graft   . Cardiac catheterization   . Esophagogastroduodenoscopy 12/22/2011    Procedure:  ESOPHAGOGASTRODUODENOSCOPY (EGD);  Surgeon: Iva Boop, MD;  Location: [facility];  Service: Endoscopy;  Laterality: N/A;    Belenda Cruise  Dietetic Intern Pager: [phone]

## 2012-02-24 LAB — BASIC METABOLIC PANEL
CO2: 21 mEq/L (ref 19–32)
Calcium: 8.1 mg/dL — ABNORMAL LOW (ref 8.4–10.5)
Creatinine, Ser: 1.48 mg/dL — ABNORMAL HIGH (ref 0.50–1.10)
GFR calc Af Amer: 35 mL/min — ABNORMAL LOW (ref >90)

## 2012-02-24 LAB — URINE CULTURE

## 2012-02-24 LAB — GLUCOSE, CAPILLARY
Glucose-Capillary: 95 mg/dL (ref 70–99)
Glucose-Capillary: 127 mg/dL — ABNORMAL HIGH (ref 70–99)

## 2012-02-24 LAB — CBC
MCHC: 31.8 g/dL (ref 30.0–36.0)
MCV: 86 fL (ref 78.0–100.0)
Platelets: 139 10*3/uL — ABNORMAL LOW (ref 150–400)
RDW: 19.2 % — ABNORMAL HIGH (ref 11.5–15.5)
WBC: 7.1 10*3/uL (ref 4.0–10.5)

## 2012-02-24 MED ORDER — VITAMINS A & D EX OINT
TOPICAL_OINTMENT | CUTANEOUS | Status: AC
  Administered 2012-02-24: 5
  Filled 2012-02-24: qty 5

## 2012-02-24 NOTE — Progress Notes (Signed)
Utilization review completeted

## 2012-02-24 NOTE — Evaluation (Signed)
Physical Therapy Evaluation Patient Details Name: [name] MRN: [id number] DOB: 1920-01-19 Today's Date: 02/24/2012 Time: 4098-1191 PT Time Calculation (min): 24 min  PT Assessment / Plan / Recommendation Clinical Impression  Pt. was admitted 2/7/ with fever, chills, UTI. Pt. lives at home with 24/caregivers who take very good care of pt. Pt. will benefir from PT while in acute care.    PT Assessment  Patient needs continued PT services    Follow Up Recommendations  No PT follow up    Does the patient have the potential to tolerate intense rehabilitation      Barriers to Discharge        Equipment Recommendations  None recommended by PT    Recommendations for Other Services     Frequency Min 3X/week    Precautions / Restrictions Precautions Precautions: Fall   Pertinent Vitals/Pain      Mobility  Bed Mobility Bed Mobility: Supine to Sit;Sitting - Scoot to Edge of Bed Supine to Sit: 1: +1 Total assist Sitting - Scoot to Edge of Bed: 2: Max assist Transfers Transfers: Sit to Stand;Stand to [employer] Transfers Sit to Stand: 1: +2 Total assist Sit to Stand: Patient Percentage: 60% Stand to Sit: 3: Mod assist Details for Transfer Assistance: pt takes extra time for pivoting but was able to perform activity.    Exercises     PT Diagnosis: Generalized weakness  PT Problem List: Decreased strength;Decreased activity tolerance;Decreased mobility;Decreased safety awareness PT Treatment Interventions: Functional mobility training;Therapeutic activities;Therapeutic exercise;Patient/family education   PT Goals Acute Rehab PT Goals PT Goal Formulation: With patient/family Time For Goal Achievement: 02/24/12 Potential to Achieve Goals: Good Pt will go Supine/Side to Sit: with min assist PT Goal: Supine/Side to Sit - Progress: Goal set today Pt will go Sit to Supine/Side: with min assist PT Goal: Sit to Supine/Side - Progress: Goal set today Pt will go Sit  to Stand: with mod assist PT Goal: Sit to Stand - Progress: Goal set today Pt will go Stand to Sit: with mod assist PT Goal: Stand to Sit - Progress: Goal set today Pt will Transfer Bed to Chair/Chair to Bed: with mod assist PT Transfer Goal: Bed to Chair/Chair to Bed - Progress: Goal set today  Visit Information  Last PT Received On: 02/24/12 Assistance Needed: +2    Subjective Data  Subjective: UI want to go back to sleep. Patient Stated Goal: Per daughter, to return home.   Prior Functioning  Home Living Lives With: Family Available Help at Discharge: Family Type of Home: House Home Access: Level entry Home Layout: One level Home Adaptive Equipment: Bedside commode/3-in-1;Wheelchair - manual;Other (comment) (hospital bed.) Additional Comments: sits in [facility]  for meals, o/w in bed. Prior Function Level of Independence: Needs assistance Needs Assistance: Bathing;Dressing;Feeding;Grooming;Toileting Bath: Maximal Dressing: Maximal Feeding: Maximal Grooming: Maximal Communication Communication: HOH;Other (comment) (pt is legally blind.)    Cognition  Cognition Overall Cognitive Status: History of cognitive impairments - at baseline Arousal/Alertness: Awake/alert Orientation Level: Appears intact for tasks assessed Behavior During Session: [facility] for tasks performed    Extremity/Trunk Assessment Right Lower Extremity Assessment RLE ROM/Strength/Tone: Deficits RLE ROM/Strength/Tone Deficits: grossly 4 for standing  Left Lower Extremity Assessment LLE ROM/Strength/Tone: Deficits LLE ROM/Strength/Tone Deficits: same as R   Balance Balance Balance Assessed: Yes Static Sitting Balance Static Sitting - Balance Support: Bilateral upper extremity supported Static Sitting - Level of Assistance: 5: Stand by assistance  End of Session PT - End of Session Activity Tolerance: Patient tolerated  treatment well Patient left: in chair;with call bell/phone within reach;with family/visitor  present Nurse Communication: Mobility status  GP     Rada Hay 02/24/2012, 12:43 [id number]

## 2012-02-24 NOTE — Progress Notes (Signed)
[facility] PROGRESS NOTE  [name]:[id number] DOB: [birth year] DOA: 02/22/2012 PCP: August Saucer [name], MD  Assessment/Plan:  Hypotension - Resolved and likely due to nitropaste as well as B blocker medication in setting of intravascular hypovolemia 2ary to decreased po intake from recent uti. - Place holding parameters on B blocker - continue to monitor blood pressures   UTI  -Patient has UTI symptoms and urinalysis consistent with UTI.  -Has history of ESBL Escherichia coli UTI, I will start empirically on Primaxin.  -Urine culture sent and growing E coli.  Sensitivities pending. -Hemodynamically stable, her lactate is normal.   Acute renal failure  -Baseline creatinine is 0.9, she came in with a creatinine of 1.7 and today her creatinine is trending down and is at 1.48. -This is likely secondary to dehydration from poor oral intake and diuretics.  -Given history of CHF will hold IVF's today, continue to hold demadex.  - Recheck BMP next am.  Diabetes mellitus 2  -She has hemoglobin A1c of 6.8, she is not on hypoglycemic agents.  -Carbohydrate modified diet, insulin sliding scale.  -would recommend a diabetic diet once patient is transitioned out of the hospital.  Recent history of anemia  -Patient has recent history of an anemia and hemoglobin of 6.7 she was on Plavix and that was discontinued. - Hemoglobin steady today.  Etiology of anemia uncertain.  Reportedly patient did not want colonoscopy given her age per discussion with daughter.  Based on dietician's note patient meets criteria for malnutrition in the context of chronic illness.  I agree given current presentation and reports of decreased po intake  less than 75% of estimated needs  Code Status: full Family Communication: None at bedside Disposition Plan: Pending further improvement in condition.   Consultants:  none  Procedures:  none  Antibiotics:  Primaxin  HPI/Subjective: Patient  reports that she feels better today.  No acute issues reported overnight.  Po intake still poor.  Objective: Filed Vitals:   02/23/12 1810 02/23/12 2153 02/24/12 0500 02/24/12 1421  BP: 90/58 88/50 96/57  126/84  Pulse:  75 73 85  Temp:  98.6 F (37 C) 98.2 F (36.8 C) 97.8 F (36.6 C)  TempSrc:  Oral Oral Oral  Resp:  18 18 18   Height:      Weight:      SpO2:  92% 93% 100%    Intake/Output Summary (Last 24 hours) at 02/24/12 1423 Last data filed at 02/24/12 0230  Gross per 24 hour  Intake 1953.75 ml  Output     26 ml  Net 1927.75 ml   Filed Weights   02/22/12 2300  Weight: 81.8 kg (180 lb 5.4 oz)    Exam:   General:  Pt in NAD, Alert and Awake  Cardiovascular: RRR, No MRG  Respiratory: CTA BL, no wheezes, no increased WOB  Abdomen: soft, NT, ND, + suprapubic discomfort  Data Reviewed: Basic Metabolic Panel:  Recent Labs Lab 02/22/12 1916 02/23/12 0445 02/24/12 0518  NA 138 135 134*  K 5.1 5.6* 4.7  CL 106 103 106  CO2  --  25 21  GLUCOSE 160* 173* 110*  BUN 17 18 18   CREATININE 1.70* 1.84* 1.48*  CALCIUM  --  8.7 8.1*   Liver Function Tests: No results found for this basename: AST, ALT, ALKPHOS, BILITOT, PROT, ALBUMIN,  in the last 168 hours No results found for this basename: LIPASE, AMYLASE,  in the last 168 hours No results found for this basename: AMMONIA,  in the last 168 hours CBC:  Recent Labs Lab 02/22/12 1916 02/22/12 1919 02/23/12 0445 02/24/12 0518  WBC  --  11.0* 11.7* 7.1  NEUTROABS  --  9.6*  --   --   HGB 13.6 12.1 10.8* 9.2*  HCT 40.0 37.7 34.0* 28.9*  MCV  --  85.7 86.1 86.0  PLT  --  159 146* 139*   Cardiac Enzymes: No results found for this basename: CKTOTAL, CKMB, CKMBINDEX, TROPONINI,  in the last 168 hours BNP (last 3 results)  Recent Labs  12/15/11 0650 12/17/11 1603 01/01/12 0435  PROBNP 1637.0* 2026.0* 2693.0*   CBG:  Recent Labs Lab 02/23/12 1408 02/23/12 1712 02/23/12 2117 02/24/12 0807  02/24/12 1148  GLUCAP 150* 177* 138* 95 107*    Recent Results (from the past 240 hour(s))  URINE CULTURE     Status: None   Collection Time    02/22/12  8:42 PM      Result Value Range Status   Specimen Description URINE, CATHETERIZED   Final   Special Requests NONE   Final   Culture  Setup Time 02/23/2012 05:59   Final   Colony Count >=100,000 COLONIES/ML   Final   Culture ESCHERICHIA COLI   Final   Report Status PENDING   Incomplete     Studies: Dg Chest 2 View  02/22/2012  *RADIOLOGY REPORT*  Clinical Data: Nausea, vomiting and weakness.  CHEST - 2 VIEW  Comparison: PA and lateral chest 12/29/2011 and 06/05/2009.  Findings: Heart size is upper normal.  The patient is status post CABG with a pacing device in place.  Lungs are clear.  Elevation of the right hemidiaphragm is unchanged.  No pneumothorax or pleural fluid is identified.  IMPRESSION: No acute finding.   Original Report Authenticated By: Holley Dexter, M.D.     Scheduled Meds: . aspirin  81 mg Oral Daily  . atorvastatin  40 mg Oral q1800  . feeding supplement  237 mL Oral TID BM  . ferrous sulfate  325 mg Oral Q breakfast  . folic acid  1 mg Oral Daily  . gabapentin  100 mg Oral BID  . heparin  5,000 Units Subcutaneous Q8H  . imipenem-cilastatin  250 mg Intravenous Q6H  . insulin aspart  0-9 Units Subcutaneous TID WC  . levothyroxine  50 mcg Oral QAC breakfast  . loratadine  10 mg Oral Daily  . metoprolol tartrate  12.5 mg Oral BID  . pantoprazole  40 mg Oral BID  . potassium chloride SA  20 mEq Oral Daily  . [START ON 02/26/2012] Vitamin D (Ergocalciferol)  50,000 Units Oral Q7 days   Continuous Infusions:   Principal Problem:   UTI (lower urinary tract infection) Active Problems:   Diabetes mellitus   Hypotension   Acute on chronic renal failure   Fever    Time spent: > 35 minutes    [name]  [facility] Pager [phone] If 8PM-8AM, please contact night-coverage at [email or web address],  password [facility] 02/24/2012, 2:23 PM  LOS: 2 days

## 2012-02-25 ENCOUNTER — Inpatient Hospital Stay (HOSPITAL_COMMUNITY): Payer: Medicare Other

## 2012-02-25 LAB — BASIC METABOLIC PANEL
BUN: 14 mg/dL (ref 6–23)
Chloride: 108 mEq/L (ref 96–112)
GFR calc Af Amer: 44 mL/min — ABNORMAL LOW (ref >90)
Potassium: 4.8 mEq/L (ref 3.5–5.1)
Sodium: 136 mEq/L (ref 135–145)

## 2012-02-25 LAB — GLUCOSE, CAPILLARY
Glucose-Capillary: 120 mg/dL — ABNORMAL HIGH (ref 70–99)
Glucose-Capillary: 104 mg/dL — ABNORMAL HIGH (ref 70–99)

## 2012-02-25 MED ORDER — SODIUM CHLORIDE 0.9 % IJ SOLN: 10.0000 mL | INTRAMUSCULAR | Status: DC | PRN

## 2012-02-25 NOTE — Progress Notes (Signed)
Peripherally Inserted Central Catheter/Midline Placement  The IV Nurse has discussed with the patient and/or persons authorized to consent for the patient, the purpose of this procedure and the potential benefits and risks involved with this procedure.  The benefits include less needle sticks, lab draws from the catheter and patient may be discharged home with the catheter.  Risks include, but not limited to, infection, bleeding, blood clot (thrombus formation), and puncture of an artery; nerve damage and irregular heat beat.  Alternatives to this procedure were also discussed.  PICC/Midline Placement Documentation  PICC / Midline Single Lumen 02/25/12 PICC Right Cephalic (Active)       Stacie Glaze Horton 02/25/2012, 7:43 PM

## 2012-02-25 NOTE — Progress Notes (Signed)
[facility] PROGRESS NOTE  [name] GNF:[id number] DOB: [birth year] DOA: 02/22/2012 PCP: August Saucer [name], MD  Assessment/Plan:  Hypotension - Resolved and likely due to nitropaste as well as B blocker medication in setting of intravascular hypovolemia 2ary to decreased po intake from recent uti. - Place holding parameters on B blocker - continue to monitor blood pressures   UTI  -Patient has UTI symptoms and urinalysis consistent with UTI.  -Has history of ESBL Escherichia coli UTI primaxin currently on board and E coli sensitive, E coli is MDR and no reasonable choice for oral antibiotic.  As such will plan on placing picc line for IV antibiotic drug administration at home.  -Hemodynamically stable, her lactate is normal.  - Today is day 4 of antibiotic regimen.  Will shoot for 7 days total.  Acute renal failure  - Baseline creatinine is 0.9, she came in with a creatinine of 1.7 and today her creatinine is trending down and is at 1.23 - This is likely secondary to dehydration from poor oral intake and diuretics.  - Given history of CHF will hold IVF's today, continue to hold demadex.  - Creatinine improved with IVF's initially and most recently with improved oral intake.  Diabetes mellitus 2  -She has hemoglobin A1c of 6.8, she is not on hypoglycemic agents.  -Carbohydrate modified diet, insulin sliding scale.  -would recommend a diabetic diet once patient is transitioned out of the hospital.  Recent history of anemia  -Patient has recent history of an anemia and hemoglobin of 6.7 she was on Plavix and that was discontinued. - Hemoglobin steady today.  Etiology of anemia uncertain.  Reportedly patient did not want colonoscopy given her age per discussion with daughter.  Based on dietitian's note patient meets criteria for malnutrition in the context of chronic illness.  I agree given current presentation and reports of decreased po intake  less than 75% of estimated  needs  Code Status: full Family Communication: None at bedside Disposition Plan: Pending further improvement in condition.   Consultants:  none  Procedures:  none  Antibiotics:  Primaxin  HPI/Subjective: No new complaints.  Patient and daughter had lots of questions regarding antibiotic administration and plans for treatment of UTI.   Objective: Filed Vitals:   02/24/12 0500 02/24/12 1421 02/24/12 2200 02/25/12 0600  BP: 96/57 126/84 119/76 101/60  Pulse: 73 85 92 68  Temp: 98.2 F (36.8 C) 97.8 F (36.6 C) 97.8 F (36.6 C) 98 F (36.7 C)  TempSrc: Oral Oral Oral Oral  Resp: 18 18 20 18   Height:      Weight:      SpO2: 93% 100% 95% 93%    Intake/Output Summary (Last 24 hours) at 02/25/12 1408 Last data filed at 02/24/12 1542  Gross per 24 hour  Intake      0 ml  Output      1 ml  Net     -1 ml   Filed Weights   02/22/12 2300  Weight: 81.8 kg (180 lb 5.4 oz)    Exam:   General:  Pt in NAD, Alert and Awake  Cardiovascular: RRR, No MRG  Respiratory: CTA BL, no wheezes, no increased WOB  Abdomen: soft, NT, ND, + suprapubic discomfort  Data Reviewed: Basic Metabolic Panel:  Recent Labs Lab 02/22/12 1916 02/23/12 0445 02/24/12 0518 02/25/12 0503  NA 138 135 134* 136  K 5.1 5.6* 4.7 4.8  CL 106 103 106 108  CO2  --  25 21  21  GLUCOSE 160* 173* 110* 115*  BUN 17 18 18 14   CREATININE 1.70* 1.84* 1.48* 1.23*  CALCIUM  --  8.7 8.1* 8.2*   Liver Function Tests: No results found for this basename: AST, ALT, ALKPHOS, BILITOT, PROT, ALBUMIN,  in the last 168 hours No results found for this basename: LIPASE, AMYLASE,  in the last 168 hours No results found for this basename: AMMONIA,  in the last 168 hours CBC:  Recent Labs Lab 02/22/12 1916 02/22/12 1919 02/23/12 0445 02/24/12 0518  WBC  --  11.0* 11.7* 7.1  NEUTROABS  --  9.6*  --   --   HGB 13.6 12.1 10.8* 9.2*  HCT 40.0 37.7 34.0* 28.9*  MCV  --  85.7 86.1 86.0  PLT  --  159 146*  139*   Cardiac Enzymes: No results found for this basename: CKTOTAL, CKMB, CKMBINDEX, TROPONINI,  in the last 168 hours BNP (last 3 results)  Recent Labs  12/15/11 0650 12/17/11 1603 01/01/12 0435  PROBNP 1637.0* 2026.0* 2693.0*   CBG:  Recent Labs Lab 02/24/12 1148 02/24/12 1724 02/24/12 2117 02/25/12 0739 02/25/12 1143  GLUCAP 107* 127* 120* 114* 104*    Recent Results (from the past 240 hour(s))  URINE CULTURE     Status: None   Collection Time    02/22/12  8:42 PM      Result Value Range Status   Specimen Description URINE, CATHETERIZED   Final   Special Requests NONE   Final   Culture  Setup Time 02/23/2012 05:59   Final   Colony Count >=100,000 COLONIES/ML   Final   Culture     Final   Value: ESCHERICHIA COLI     Note: Confirmed Extended Spectrum Beta-Lactamase Producer (ESBL)   Report Status 02/24/2012 FINAL   Final   Organism ID, Bacteria ESCHERICHIA COLI   Final     Studies: No results found.  Scheduled Meds: . aspirin  81 mg Oral Daily  . atorvastatin  40 mg Oral q1800  . feeding supplement  237 mL Oral TID BM  . ferrous sulfate  325 mg Oral Q breakfast  . folic acid  1 mg Oral Daily  . gabapentin  100 mg Oral BID  . heparin  5,000 Units Subcutaneous Q8H  . imipenem-cilastatin  250 mg Intravenous Q6H  . insulin aspart  0-9 Units Subcutaneous TID WC  . levothyroxine  50 mcg Oral QAC breakfast  . loratadine  10 mg Oral Daily  . metoprolol tartrate  12.5 mg Oral BID  . pantoprazole  40 mg Oral BID  . potassium chloride SA  20 mEq Oral Daily  . [START ON 02/26/2012] Vitamin D (Ergocalciferol)  50,000 Units Oral Q7 days   Continuous Infusions:   Principal Problem:   UTI (lower urinary tract infection) Active Problems:   Diabetes mellitus   Hypotension   Acute on chronic renal failure   Fever    Time spent: > 35 minutes    [name]  [facility] Pager [phone] If 8PM-8AM, please contact night-coverage at [email or web address],  password [facility] 02/25/2012, 2:08 PM  LOS: 3 days

## 2012-02-25 NOTE — Progress Notes (Signed)
ANTIBIOTIC CONSULT NOTE - follow up  Pharmacy Consult for Imipenem Indication: UTI  Allergies  Allergen Reactions  . Darvon   . Other     Tylenol #3  . Penicillins Other (See Comments)    Was told had allergy from childhood... Unknown reaction  . Percocet (Oxycodone-Acetaminophen)   . Percodan (Oxycodone-Aspirin)   . Vicodin (Hydrocodone-Acetaminophen)    Patient Measurements: Height: 5\' 4"  (162.6 cm) Weight: 180 lb 5.4 oz (81.8 kg) IBW/kg (Calculated) : 54.7  Vital Signs: Temp: 97.8 F (36.6 C) (02/08 2200) Temp src: Oral (02/08 2200) BP: 119/76 mmHg (02/08 2200) Pulse Rate: 92 (02/08 2200) Intake/Output from previous day: 02/08 0701 - 02/09 0700 In: -  Out: 1 [Urine:1] Intake/Output from this shift:    Labs:  Recent Labs  02/22/12 1919 02/23/12 0445 02/23/12 1739 02/24/12 0518 02/25/12 0503  WBC 11.0* 11.7*  --  7.1  --   HGB 12.1 10.8*  --  9.2*  --   PLT 159 146*  --  139*  --   LABCREA  --   --  100.8  --   --   CREATININE  --  1.84*  --  1.48* 1.23*   Estimated Creatinine Clearance: 32.1 ml/min (by C-G formula based on Cr of 1.23). No results found for this basename: VANCOTROUGH, Leodis Binet, VANCORANDOM, GENTTROUGH, GENTPEAK, GENTRANDOM, TOBRATROUGH, TOBRAPEAK, TOBRARND, AMIKACINPEAK, AMIKACINTROU, AMIKACIN,  in the last 72 hours   Microbiology: Recent Results (from the past 720 hour(s))  URINE CULTURE     Status: None   Collection Time    02/22/12  8:42 PM      Result Value Range Status   Specimen Description URINE, CATHETERIZED   Final   Special Requests NONE   Final   Culture  Setup Time 02/23/2012 05:59   Final   Colony Count >=100,000 COLONIES/ML   Final   Culture     Final   Value: ESCHERICHIA COLI     Note: Confirmed Extended Spectrum Beta-Lactamase Producer (ESBL)   Report Status 02/24/2012 FINAL   Final   Organism ID, Bacteria ESCHERICHIA COLI   Final   Assessment:  79yo F  admitted with symptoms of UTI. Patient has a hx of ESBL  organism UTI.  Day #3 Primaxin.   Urine cx grew ESBL+ E.coli, sens to Primaxin, Zosyn, aminoglycosides, nitrofurantoin.  Afebrile, WBCs wnl.  SCr was elevated on admission but has improved. CrCl ~32.   Goal of Therapy:  Primaxin dosed based on patient weight and renal function  Plan:   Continue Primaxin 250mg  IV q6h.  Follow up renal fxn and clinical progress.   Charolotte Eke, PharmD, pager [phone]. 02/25/2012,7:21 AM.

## 2012-02-26 DIAGNOSIS — N179 Acute kidney failure, unspecified: Secondary | ICD-10-CM

## 2012-02-26 DIAGNOSIS — I959 Hypotension, unspecified: Secondary | ICD-10-CM

## 2012-02-26 DIAGNOSIS — N390 Urinary tract infection, site not specified: Secondary | ICD-10-CM

## 2012-02-26 LAB — GLUCOSE, CAPILLARY: Glucose-Capillary: 107 mg/dL — ABNORMAL HIGH (ref 70–99)

## 2012-02-26 MED ORDER — FOSFOMYCIN TROMETHAMINE 3 G PO PACK
3.0000 g | PACK | Freq: Once | ORAL | Status: AC
  Administered 2012-02-26: 3 g via ORAL
  Filled 2012-02-26: qty 3

## 2012-02-26 MED ORDER — NITROGLYCERIN 0.2 MG/HR TD PT24: 1.0000 | MEDICATED_PATCH | Freq: Every day | TRANSDERMAL | Status: DC | PRN

## 2012-02-26 NOTE — Progress Notes (Signed)
Patient discharged to home.  Ambulance notified.  Picc line in right upper arm removed by IV team at 1330.  [facility] to follow patient at home, family is aware.  Reviewed discharge instructions with patient's brother because patient is blind.  IV removed form left forearm.  Assessed for any questions regarding discharge, patient and family without questions at this time.  Patient escorted off the floor via stretcher.  Patient discharged.

## 2012-02-26 NOTE — Progress Notes (Signed)
Pt Note  Attempted PT tx session. Pt eating breakfast. Will attempt to check back later today. Thanks. Rebeca Alert, PT [phone]

## 2012-02-26 NOTE — Discharge Summary (Signed)
Physician Discharge Summary  [name] FAO:[id number] DOB: 1920-05-03 DOA: 02/22/2012  PCP: Willey Blade, MD  Admit date: 02/22/2012 Discharge date: 02/26/2012  Time spent: > 35 minutes  Recommendations for Outpatient Follow-up:  Hypotension  - Resolved and likely due to nitropaste as well as B blocker medication in setting of intravascular hypovolemia 2ary to decreased po intake.  - continue home regimen  UTI  -Patient has UTI symptoms and urinalysis consistent with UTI.  - Grew MDR E coli.  Patient has received four days of antibiotics IV Primaxin while in house. - Had picc line placed for IV antibiotic adminsitration but after discussion with pharmacist about options phosphomycin will be available for administration orally and would cover the patient sufficiently without the need to continue IV antibiotics at home. - Therefore I am removing picc line.  - Recommend following up with PCP in 1 week for further evaluation and recommendations.  Acute renal failure  - Resolved with improved hydration and improved oral intake - This is likely secondary to dehydration from poor oral intake and diuretics.   Diabetes mellitus 2  -She has hemoglobin A1c of 6.8, she is not on hypoglycemic agents.  -Carbohydrate modified diet, insulin sliding scale while in house.  -will recommend that patient follow up with PCP for further instructions and recommendations  Recent history of anemia  -Patient has recent history of an anemia and hemoglobin of 6.7 she was on Plavix and that was discontinued.  - Hemoglobin steady today. Etiology of anemia uncertain. Reportedly patient did not want colonoscopy given her age per discussion with daughter.   Based on dietitian's note patient meets criteria for malnutrition in the context of chronic illness. I agree given current presentation and reports of decreased po intake less than 75% of estimated needs   Discharge Diagnoses:  Principal Problem:   UTI  (lower urinary tract infection) Active Problems:   Diabetes mellitus   Hypotension   Acute on chronic renal failure   Fever   Discharge Condition: stable  Diet recommendation: diabetic diet  Filed Weights   02/22/12 2300  Weight: 81.8 kg (180 lb 5.4 oz)    History of present illness:  From original HPI: [name] is a 79 y.o. female with past medical history of hypertension, diabetes mellitus and hypothyroidism. Patient came in to the hospital because of fever and chills. Her daughter was at bedside at the time of this interview. Patient started complaining about burning while passing urine since yesterday, today she has fever and chills. Her expected her to have UTI so she took urine sample to Dr. Diamantina [place] office. It was believed to have UTI so patient was asked to come to the ED for further evaluation. On initial evaluation in the emergency department showed urinalysis consistent with UTI with TNTC puss cells. Patient also has fever of 101.4. Patient to be admitted to the hospital for further evaluation.   Hospital Course:  As listed above.  Procedures:  none  Consultations:  Pharmacist  Discharge Exam: Filed Vitals:   02/25/12 1506 02/25/12 2218 02/26/12 0625 02/26/12 1005  BP: 114/71 128/79 117/70 128/76  Pulse: 72 83 69 76  Temp: 97.9 F (36.6 C) 97.9 F (36.6 C) 97.8 F (36.6 C)   TempSrc: Oral Oral Oral   Resp: 18 20 20    Height:      Weight:      SpO2: 94% 93% 96%     General: Pt in NAD, Alert and Awake Cardiovascular: RRR, no mrg  Respiratory: CTA BL, no wheezes  Discharge Instructions  Discharge Orders   Future Orders Complete By Expires     Call MD for:  persistant nausea and vomiting  As directed     Call MD for:  redness, tenderness, or signs of infection (pain, swelling, redness, odor or green/yellow discharge around incision site)  As directed     Call MD for:  temperature >100.4  As directed     Diet - low sodium heart healthy  As  directed     Discharge instructions  As directed     Comments:      Please follow up with pcp in 1-2 weeks or sooner should any new concerns arise.    Increase activity slowly  As directed         Medication List    TAKE these medications       aspirin 81 MG chewable tablet  Chew 81 mg by mouth daily.     feeding supplement Liqd  Take 237 mLs by mouth daily.     ferrous sulfate 325 (65 FE) MG tablet  Take 325 mg by mouth daily with breakfast.     folic acid 1 MG tablet  Commonly known as:  FOLVITE  Take 1 mg by mouth daily.     gabapentin 100 MG capsule  Commonly known as:  NEURONTIN  Take 100 mg by mouth 2 (two) times daily.     levothyroxine 50 MCG tablet  Commonly known as:  SYNTHROID, LEVOTHROID  Take 50 mcg by mouth daily.     loratadine 10 MG tablet  Commonly known as:  CLARITIN  Take 10 mg by mouth daily. For allergies     metoprolol tartrate 25 MG tablet  Commonly known as:  LOPRESSOR  Take 12.5 mg by mouth 2 (two) times daily.     nitroGLYCERIN 0.2 mg/hr  Commonly known as:  NITRODUR - Dosed in mg/24 hr  Place 1 patch (0.2 mg total) onto the skin prn chest discomfort.     pantoprazole 40 MG tablet  Commonly known as:  PROTONIX  Take 1 tablet (40 mg total) by mouth 2 (two) times daily before a meal.     potassium chloride SA 20 MEQ tablet  Commonly known as:  K-DUR,KLOR-CON  Take 20 mEq by mouth daily.     rosuvastatin 10 MG tablet  Commonly known as:  CRESTOR  Take 10 mg by mouth daily.     torsemide 20 MG tablet  Commonly known as:  DEMADEX  Take 20 mg by mouth every other day.     Vitamin D (Ergocalciferol) 50000 UNITS Caps  Commonly known as:  DRISDOL  Take 50,000 Units by mouth every 7 (seven) days. Mondays     zolpidem 10 MG tablet  Commonly known as:  AMBIEN  Take 5 mg by mouth at bedtime as needed. sleep          The results of significant diagnostics from this hospitalization (including imaging, microbiology, ancillary and  laboratory) are listed below for reference.    Significant Diagnostic Studies: Dg Chest 2 View  02/22/2012  *RADIOLOGY REPORT*  Clinical Data: Nausea, vomiting and weakness.  CHEST - 2 VIEW  Comparison: PA and lateral chest 12/29/2011 and 06/05/2009.  Findings: Heart size is upper normal.  The patient is status post CABG with a pacing device in place.  Lungs are clear.  Elevation of the right hemidiaphragm is unchanged.  No pneumothorax or pleural fluid is identified.  IMPRESSION: No acute finding.   Original Report Authenticated By: Holley Dexter, M.D.    Dg Chest Port 1 View  02/25/2012  *RADIOLOGY REPORT*  Clinical Data: PICC line placement.  PORTABLE CHEST - 1 VIEW  Comparison: 02/22/2012 and 12/29/2011.  Findings: 1951 hours.  New right-sided PICC extends to approximately the SVC right atrial level.  Left subclavian pacemaker leads are unchanged.  There is stable cardiomegaly and mild vascular congestion.  No overt pulmonary edema, confluent airspace opacity, pleural effusion or pneumothorax is seen.  There are surgical clips in the lower left neck.  IMPRESSION: PICC line tip at the SVC right atrial level.  No evidence of pneumothorax.   Original Report Authenticated By: Carey Bullocks, M.D.     Microbiology: Recent Results (from the past 240 hour(s))  URINE CULTURE     Status: None   Collection Time    02/22/12  8:42 PM      Result Value Range Status   Specimen Description URINE, CATHETERIZED   Final   Special Requests NONE   Final   Culture  Setup Time 02/23/2012 05:59   Final   Colony Count >=100,000 COLONIES/ML   Final   Culture     Final   Value: ESCHERICHIA COLI     Note: Confirmed Extended Spectrum Beta-Lactamase Producer (ESBL)   Report Status 02/24/2012 FINAL   Final   Organism ID, Bacteria ESCHERICHIA COLI   Final     Labs: Basic Metabolic Panel:  Recent Labs Lab 02/22/12 1916 02/23/12 0445 02/24/12 0518 02/25/12 0503  NA 138 135 134* 136  K 5.1 5.6* 4.7 4.8  CL  106 103 106 108  CO2  --  25 21 21   GLUCOSE 160* 173* 110* 115*  BUN 17 18 18 14   CREATININE 1.70* 1.84* 1.48* 1.23*  CALCIUM  --  8.7 8.1* 8.2*   Liver Function Tests: No results found for this basename: AST, ALT, ALKPHOS, BILITOT, PROT, ALBUMIN,  in the last 168 hours No results found for this basename: LIPASE, AMYLASE,  in the last 168 hours No results found for this basename: AMMONIA,  in the last 168 hours CBC:  Recent Labs Lab 02/22/12 1916 02/22/12 1919 02/23/12 0445 02/24/12 0518  WBC  --  11.0* 11.7* 7.1  NEUTROABS  --  9.6*  --   --   HGB 13.6 12.1 10.8* 9.2*  HCT 40.0 37.7 34.0* 28.9*  MCV  --  85.7 86.1 86.0  PLT  --  159 146* 139*   Cardiac Enzymes: No results found for this basename: CKTOTAL, CKMB, CKMBINDEX, TROPONINI,  in the last 168 hours BNP: BNP (last 3 results)  Recent Labs  12/15/11 0650 12/17/11 1603 01/01/12 0435  PROBNP 1637.0* 2026.0* 2693.0*   CBG:  Recent Labs Lab 02/25/12 0739 02/25/12 1143 02/25/12 1621 02/25/12 2152 02/26/12 0751  GLUCAP 114* 104* 111* 128* 107*       Signed:  [name]  [facility] 02/26/2012, 12:43 PM

## 2012-02-26 NOTE — Progress Notes (Signed)
HHPT/OT/RN and HH aide services will be resumed with [facility]. Spoke with pt, daughter and son about this.

## 2012-03-15 ENCOUNTER — Encounter: Payer: Self-pay | Admitting: General Practice

## 2012-04-22 ENCOUNTER — Encounter: Payer: Self-pay | Admitting: *Deleted

## 2012-04-23 ENCOUNTER — Encounter: Payer: Self-pay | Admitting: Cardiovascular Disease

## 2012-05-06 ENCOUNTER — Encounter: Payer: Self-pay | Admitting: *Deleted

## 2012-06-07 ENCOUNTER — Ambulatory Visit: Payer: Medicare Other | Admitting: Internal Medicine

## 2012-06-13 ENCOUNTER — Inpatient Hospital Stay (HOSPITAL_COMMUNITY)
Admission: EM | Admit: 2012-06-13 | Discharge: 2012-06-19 | DRG: 299 | Disposition: A | Discharge status: Home / Self Care | Payer: Medicare Other | Attending: Internal Medicine | Admitting: Internal Medicine

## 2012-06-13 ENCOUNTER — Other Ambulatory Visit (HOSPITAL_COMMUNITY): Payer: Self-pay | Admitting: Orthopedic Surgery

## 2012-06-13 ENCOUNTER — Other Ambulatory Visit: Payer: Self-pay

## 2012-06-13 ENCOUNTER — Other Ambulatory Visit (HOSPITAL_COMMUNITY): Payer: Self-pay | Admitting: Internal Medicine

## 2012-06-13 ENCOUNTER — Ambulatory Visit (HOSPITAL_COMMUNITY)
Admission: RE | Admit: 2012-06-13 | Discharge: 2012-06-13 | Disposition: A | Discharge status: Home / Self Care | Payer: Medicare Other | Source: Ambulatory Visit | Attending: Internal Medicine | Admitting: Internal Medicine

## 2012-06-13 ENCOUNTER — Encounter (HOSPITAL_COMMUNITY): Payer: Self-pay | Admitting: *Deleted

## 2012-06-13 DIAGNOSIS — R112 Nausea with vomiting, unspecified: Secondary | ICD-10-CM

## 2012-06-13 DIAGNOSIS — I4891 Unspecified atrial fibrillation: Secondary | ICD-10-CM | POA: Diagnosis present

## 2012-06-13 DIAGNOSIS — E875 Hyperkalemia: Secondary | ICD-10-CM

## 2012-06-13 DIAGNOSIS — N390 Urinary tract infection, site not specified: Secondary | ICD-10-CM

## 2012-06-13 DIAGNOSIS — M7989 Other specified soft tissue disorders: Secondary | ICD-10-CM

## 2012-06-13 DIAGNOSIS — A499 Bacterial infection, unspecified: Secondary | ICD-10-CM

## 2012-06-13 DIAGNOSIS — I252 Old myocardial infarction: Secondary | ICD-10-CM

## 2012-06-13 DIAGNOSIS — I82609 Acute embolism and thrombosis of unspecified veins of unspecified upper extremity: Secondary | ICD-10-CM | POA: Diagnosis present

## 2012-06-13 DIAGNOSIS — K3189 Other diseases of stomach and duodenum: Secondary | ICD-10-CM

## 2012-06-13 DIAGNOSIS — M25522 Pain in left elbow: Secondary | ICD-10-CM

## 2012-06-13 DIAGNOSIS — M25521 Pain in right elbow: Secondary | ICD-10-CM

## 2012-06-13 DIAGNOSIS — R509 Fever, unspecified: Secondary | ICD-10-CM

## 2012-06-13 DIAGNOSIS — E8770 Fluid overload, unspecified: Secondary | ICD-10-CM

## 2012-06-13 DIAGNOSIS — R0602 Shortness of breath: Secondary | ICD-10-CM

## 2012-06-13 DIAGNOSIS — K219 Gastro-esophageal reflux disease without esophagitis: Secondary | ICD-10-CM | POA: Diagnosis present

## 2012-06-13 DIAGNOSIS — Z86711 Personal history of pulmonary embolism: Secondary | ICD-10-CM | POA: Diagnosis present

## 2012-06-13 DIAGNOSIS — I82409 Acute embolism and thrombosis of unspecified deep veins of unspecified lower extremity: Secondary | ICD-10-CM

## 2012-06-13 DIAGNOSIS — A411 Sepsis due to other specified staphylococcus: Secondary | ICD-10-CM

## 2012-06-13 DIAGNOSIS — N189 Chronic kidney disease, unspecified: Secondary | ICD-10-CM

## 2012-06-13 DIAGNOSIS — E785 Hyperlipidemia, unspecified: Secondary | ICD-10-CM | POA: Diagnosis present

## 2012-06-13 DIAGNOSIS — Z951 Presence of aortocoronary bypass graft: Secondary | ICD-10-CM | POA: Diagnosis present

## 2012-06-13 DIAGNOSIS — I82629 Acute embolism and thrombosis of deep veins of unspecified upper extremity: Secondary | ICD-10-CM

## 2012-06-13 DIAGNOSIS — E46 Unspecified protein-calorie malnutrition: Secondary | ICD-10-CM

## 2012-06-13 DIAGNOSIS — E039 Hypothyroidism, unspecified: Secondary | ICD-10-CM | POA: Diagnosis present

## 2012-06-13 DIAGNOSIS — I82623 Acute embolism and thrombosis of deep veins of upper extremity, bilateral: Secondary | ICD-10-CM

## 2012-06-13 DIAGNOSIS — D509 Iron deficiency anemia, unspecified: Secondary | ICD-10-CM

## 2012-06-13 DIAGNOSIS — I2699 Other pulmonary embolism without acute cor pulmonale: Secondary | ICD-10-CM | POA: Diagnosis present

## 2012-06-13 DIAGNOSIS — Z950 Presence of cardiac pacemaker: Secondary | ICD-10-CM | POA: Diagnosis present

## 2012-06-13 DIAGNOSIS — E119 Type 2 diabetes mellitus without complications: Secondary | ICD-10-CM

## 2012-06-13 DIAGNOSIS — R1013 Epigastric pain: Secondary | ICD-10-CM

## 2012-06-13 DIAGNOSIS — R079 Chest pain, unspecified: Secondary | ICD-10-CM

## 2012-06-13 DIAGNOSIS — I959 Hypotension, unspecified: Secondary | ICD-10-CM

## 2012-06-13 DIAGNOSIS — I82402 Acute embolism and thrombosis of unspecified deep veins of left lower extremity: Secondary | ICD-10-CM

## 2012-06-13 DIAGNOSIS — Z6833 Body mass index (BMI) 33.0-33.9, adult: Secondary | ICD-10-CM

## 2012-06-13 DIAGNOSIS — I82403 Acute embolism and thrombosis of unspecified deep veins of lower extremity, bilateral: Secondary | ICD-10-CM

## 2012-06-13 DIAGNOSIS — G8929 Other chronic pain: Secondary | ICD-10-CM

## 2012-06-13 DIAGNOSIS — D638 Anemia in other chronic diseases classified elsewhere: Secondary | ICD-10-CM

## 2012-06-13 DIAGNOSIS — I10 Essential (primary) hypertension: Secondary | ICD-10-CM | POA: Diagnosis present

## 2012-06-13 DIAGNOSIS — I2510 Atherosclerotic heart disease of native coronary artery without angina pectoris: Secondary | ICD-10-CM | POA: Diagnosis present

## 2012-06-13 DIAGNOSIS — R634 Abnormal weight loss: Secondary | ICD-10-CM

## 2012-06-13 DIAGNOSIS — H548 Legal blindness, as defined in USA: Secondary | ICD-10-CM | POA: Diagnosis present

## 2012-06-13 DIAGNOSIS — I824Z9 Acute embolism and thrombosis of unspecified deep veins of unspecified distal lower extremity: Principal | ICD-10-CM | POA: Diagnosis present

## 2012-06-13 DIAGNOSIS — N179 Acute kidney failure, unspecified: Secondary | ICD-10-CM

## 2012-06-13 DIAGNOSIS — E669 Obesity, unspecified: Secondary | ICD-10-CM | POA: Diagnosis present

## 2012-06-13 DIAGNOSIS — K449 Diaphragmatic hernia without obstruction or gangrene: Secondary | ICD-10-CM | POA: Diagnosis present

## 2012-06-13 DIAGNOSIS — E871 Hypo-osmolality and hyponatremia: Secondary | ICD-10-CM

## 2012-06-13 DIAGNOSIS — I129 Hypertensive chronic kidney disease with stage 1 through stage 4 chronic kidney disease, or unspecified chronic kidney disease: Secondary | ICD-10-CM | POA: Diagnosis present

## 2012-06-13 DIAGNOSIS — M79609 Pain in unspecified limb: Secondary | ICD-10-CM

## 2012-06-13 DIAGNOSIS — R5383 Other fatigue: Secondary | ICD-10-CM

## 2012-06-13 DIAGNOSIS — D649 Anemia, unspecified: Secondary | ICD-10-CM | POA: Diagnosis present

## 2012-06-13 DIAGNOSIS — E118 Type 2 diabetes mellitus with unspecified complications: Secondary | ICD-10-CM | POA: Diagnosis present

## 2012-06-13 DIAGNOSIS — B958 Unspecified staphylococcus as the cause of diseases classified elsewhere: Secondary | ICD-10-CM

## 2012-06-13 DIAGNOSIS — N182 Chronic kidney disease, stage 2 (mild): Secondary | ICD-10-CM | POA: Diagnosis present

## 2012-06-13 DIAGNOSIS — R630 Anorexia: Secondary | ICD-10-CM

## 2012-06-13 HISTORY — DX: Type 2 diabetes mellitus without complications: E11.9

## 2012-06-13 HISTORY — DX: Acute embolism and thrombosis of deep veins of unspecified upper extremity: I82.629

## 2012-06-13 HISTORY — DX: Other allergy status, other than to drugs and biological substances: Z91.09

## 2012-06-13 HISTORY — DX: Other seasonal allergic rhinitis: J30.2

## 2012-06-13 HISTORY — DX: Unspecified osteoarthritis, unspecified site: M19.90

## 2012-06-13 HISTORY — DX: Personal history of other medical treatment: Z92.89

## 2012-06-13 HISTORY — DX: Headache: R51

## 2012-06-13 HISTORY — DX: Atrioventricular block, second degree: I44.1

## 2012-06-13 HISTORY — DX: Gastro-esophageal reflux disease without esophagitis: K21.9

## 2012-06-13 HISTORY — DX: Headache, unspecified: R51.9

## 2012-06-13 HISTORY — DX: Pure hypercholesterolemia, unspecified: E78.00

## 2012-06-13 LAB — CBC WITH DIFFERENTIAL/PLATELET
Basophils Absolute: 0 10*3/uL (ref 0.0–0.1)
Eosinophils Relative: 1 % (ref 0–5)
Lymphocytes Relative: 35 % (ref 12–46)
MCV: 90.7 fL (ref 78.0–100.0)
Neutro Abs: 2.8 10*3/uL (ref 1.7–7.7)
Neutrophils Relative %: 56 % (ref 43–77)
Platelets: 147 10*3/uL — ABNORMAL LOW (ref 150–400)
RBC: 4.75 MIL/uL (ref 3.87–5.11)
RDW: 14.1 % (ref 11.5–15.5)
WBC: 5 10*3/uL (ref 4.0–10.5)

## 2012-06-13 LAB — PROTIME-INR: INR: 0.91 (ref 0.00–1.49)

## 2012-06-13 LAB — COMPREHENSIVE METABOLIC PANEL
ALT: 9 U/L (ref 0–35)
AST: 17 U/L (ref 0–37)
Alkaline Phosphatase: 87 U/L (ref 39–117)
CO2: 27 mEq/L (ref 19–32)
Calcium: 9.9 mg/dL (ref 8.4–10.5)
GFR calc non Af Amer: 44 mL/min — ABNORMAL LOW (ref >90)
Potassium: 4.4 mEq/L (ref 3.5–5.1)
Sodium: 138 mEq/L (ref 135–145)

## 2012-06-13 MED ORDER — HYDRALAZINE HCL 20 MG/ML IJ SOLN: 5.0000 mg | Freq: Four times a day (QID) | INTRAMUSCULAR | Status: DC | PRN

## 2012-06-13 MED ORDER — TORSEMIDE 20 MG PO TABS
20.0000 mg | ORAL_TABLET | ORAL | Status: DC
  Administered 2012-06-15 – 2012-06-17 (×2): 20 mg via ORAL
  Filled 2012-06-13 (×2): qty 1

## 2012-06-13 MED ORDER — SODIUM CHLORIDE 0.9 % IJ SOLN
3.0000 mL | Freq: Two times a day (BID) | INTRAMUSCULAR | Status: DC
  Administered 2012-06-15 – 2012-06-18 (×5): 3 mL via INTRAVENOUS

## 2012-06-13 MED ORDER — WARFARIN SODIUM 5 MG PO TABS
5.0000 mg | ORAL_TABLET | Freq: Once | ORAL | Status: AC
  Administered 2012-06-13: 5 mg via ORAL
  Filled 2012-06-13: qty 1

## 2012-06-13 MED ORDER — LEVOTHYROXINE SODIUM 50 MCG PO TABS
50.0000 ug | ORAL_TABLET | Freq: Every day | ORAL | Status: DC
  Administered 2012-06-14 – 2012-06-19 (×6): 50 ug via ORAL
  Filled 2012-06-13 (×7): qty 1

## 2012-06-13 MED ORDER — ISOSORBIDE MONONITRATE 15 MG HALF TABLET
15.0000 mg | ORAL_TABLET | Freq: Every day | ORAL | Status: DC
  Administered 2012-06-14 – 2012-06-19 (×7): 15 mg via ORAL
  Filled 2012-06-13 (×8): qty 1

## 2012-06-13 MED ORDER — WARFARIN - PHARMACIST DOSING INPATIENT
Freq: Every day | Status: DC
  Administered 2012-06-15 – 2012-06-16 (×2)

## 2012-06-13 MED ORDER — ASPIRIN 81 MG PO CHEW
81.0000 mg | CHEWABLE_TABLET | Freq: Every day | ORAL | Status: DC
  Administered 2012-06-13 – 2012-06-19 (×7): 81 mg via ORAL
  Filled 2012-06-13 (×7): qty 1

## 2012-06-13 MED ORDER — ZOLPIDEM TARTRATE 5 MG PO TABS: 5.0000 mg | ORAL_TABLET | Freq: Every evening | ORAL | Status: DC | PRN

## 2012-06-13 MED ORDER — ATORVASTATIN CALCIUM 10 MG PO TABS
10.0000 mg | ORAL_TABLET | Freq: Every day | ORAL | Status: DC
  Administered 2012-06-13 – 2012-06-18 (×6): 10 mg via ORAL
  Filled 2012-06-13 (×7): qty 1

## 2012-06-13 MED ORDER — PANTOPRAZOLE SODIUM 40 MG PO TBEC
40.0000 mg | DELAYED_RELEASE_TABLET | Freq: Two times a day (BID) | ORAL | Status: DC
  Administered 2012-06-13 – 2012-06-18 (×10): 40 mg via ORAL
  Filled 2012-06-13 (×10): qty 1

## 2012-06-13 MED ORDER — VITAMIN D (ERGOCALCIFEROL) 1.25 MG (50000 UNIT) PO CAPS
50000.0000 [IU] | ORAL_CAPSULE | ORAL | Status: DC
  Administered 2012-06-17: 50000 [IU] via ORAL
  Filled 2012-06-13: qty 1

## 2012-06-13 MED ORDER — ZOLPIDEM TARTRATE 5 MG PO TABS
2.5000 mg | ORAL_TABLET | Freq: Every evening | ORAL | Status: DC | PRN
  Administered 2012-06-13 – 2012-06-18 (×5): 2.5 mg via ORAL
  Filled 2012-06-13 (×5): qty 1

## 2012-06-13 MED ORDER — ENSURE COMPLETE PO LIQD
237.0000 mL | ORAL | Status: DC
  Administered 2012-06-14 – 2012-06-15 (×2): 237 mL via ORAL
  Filled 2012-06-13: qty 237

## 2012-06-13 MED ORDER — NITROGLYCERIN 0.2 MG/HR TD PT24
0.2000 mg | MEDICATED_PATCH | Freq: Every day | TRANSDERMAL | Status: DC | PRN
  Filled 2012-06-13: qty 1

## 2012-06-13 MED ORDER — INSULIN ASPART 100 UNIT/ML ~~LOC~~ SOLN: 0.0000 [IU] | Freq: Every day | SUBCUTANEOUS | Status: DC

## 2012-06-13 MED ORDER — INSULIN ASPART 100 UNIT/ML ~~LOC~~ SOLN
0.0000 [IU] | Freq: Three times a day (TID) | SUBCUTANEOUS | Status: DC
  Administered 2012-06-14: 1 [IU] via SUBCUTANEOUS
  Administered 2012-06-14 – 2012-06-15 (×5): 2 [IU] via SUBCUTANEOUS
  Administered 2012-06-16 (×2): 1 [IU] via SUBCUTANEOUS
  Administered 2012-06-17: 2 [IU] via SUBCUTANEOUS
  Administered 2012-06-17 (×2): 1 [IU] via SUBCUTANEOUS
  Administered 2012-06-18: 2 [IU] via SUBCUTANEOUS
  Administered 2012-06-18 – 2012-06-19 (×3): 1 [IU] via SUBCUTANEOUS

## 2012-06-13 MED ORDER — FERROUS SULFATE 325 (65 FE) MG PO TABS
325.0000 mg | ORAL_TABLET | Freq: Every day | ORAL | Status: DC
  Administered 2012-06-14 – 2012-06-15 (×2): 325 mg via ORAL
  Filled 2012-06-13 (×5): qty 1

## 2012-06-13 MED ORDER — COUMADIN BOOK
Freq: Once | Status: AC
  Administered 2012-06-14: 10:00:00
  Filled 2012-06-13: qty 1

## 2012-06-13 MED ORDER — LORATADINE 10 MG PO TABS
10.0000 mg | ORAL_TABLET | Freq: Every day | ORAL | Status: DC
  Administered 2012-06-14 – 2012-06-19 (×6): 10 mg via ORAL
  Filled 2012-06-13 (×6): qty 1

## 2012-06-13 MED ORDER — ACETAMINOPHEN 325 MG PO TABS
650.0000 mg | ORAL_TABLET | Freq: Four times a day (QID) | ORAL | Status: DC | PRN
  Administered 2012-06-13 – 2012-06-19 (×7): 650 mg via ORAL
  Filled 2012-06-13 (×6): qty 2

## 2012-06-13 MED ORDER — HEPARIN (PORCINE) IN NACL 100-0.45 UNIT/ML-% IJ SOLN
1100.0000 [IU]/h | INTRAMUSCULAR | Status: DC
  Administered 2012-06-13 – 2012-06-15 (×3): 1100 [IU]/h via INTRAVENOUS
  Administered 2012-06-16: 1000 [IU]/h via INTRAVENOUS
  Administered 2012-06-17: 1100 [IU]/h via INTRAVENOUS
  Filled 2012-06-13 (×7): qty 250

## 2012-06-13 MED ORDER — POTASSIUM CHLORIDE CRYS ER 20 MEQ PO TBCR
20.0000 meq | EXTENDED_RELEASE_TABLET | Freq: Every day | ORAL | Status: DC
  Administered 2012-06-13 – 2012-06-18 (×5): 20 meq via ORAL
  Filled 2012-06-13 (×6): qty 1

## 2012-06-13 MED ORDER — METOPROLOL TARTRATE 12.5 MG HALF TABLET
12.5000 mg | ORAL_TABLET | Freq: Two times a day (BID) | ORAL | Status: DC
  Administered 2012-06-13 – 2012-06-19 (×12): 12.5 mg via ORAL
  Filled 2012-06-13 (×14): qty 1

## 2012-06-13 MED ORDER — GABAPENTIN 100 MG PO CAPS
100.0000 mg | ORAL_CAPSULE | Freq: Two times a day (BID) | ORAL | Status: DC
  Administered 2012-06-13 – 2012-06-19 (×12): 100 mg via ORAL
  Filled 2012-06-13 (×13): qty 1

## 2012-06-13 MED ORDER — FOLIC ACID 1 MG PO TABS
1.0000 mg | ORAL_TABLET | Freq: Every day | ORAL | Status: DC
  Administered 2012-06-14 – 2012-06-19 (×6): 1 mg via ORAL
  Filled 2012-06-13 (×6): qty 1

## 2012-06-13 MED ORDER — HEPARIN BOLUS VIA INFUSION
4000.0000 [IU] | Freq: Once | INTRAVENOUS | Status: AC
  Administered 2012-06-13: 4000 [IU] via INTRAVENOUS

## 2012-06-13 MED ORDER — WARFARIN VIDEO
Freq: Once | Status: AC
  Administered 2012-06-14: 10:00:00

## 2012-06-13 NOTE — ED Notes (Signed)
Pt states she was told by an MD she has a small blood clot in her L arm.

## 2012-06-13 NOTE — ED Notes (Signed)
Bed placement called to inform this RN that the pt's bed upstairs was changed. Attempted report to unit 2000.

## 2012-06-13 NOTE — ED Provider Notes (Signed)
History     CSN: [id number]  Arrival date & time 06/13/12  1025   First MD Initiated Contact with Patient 06/13/12 1114      Chief Complaint  Patient presents with  . Arm Pain    (Consider location/radiation/quality/duration/timing/severity/associated sxs/prior treatment) HPI Pt with PICC line in R upper arm 2/14. States she has had bl upper arm swelling and pain since. No SOB, CP. +LLE swelling and pain. No fever chills, cough, urinary symptoms. Pt had outpt venous dopplers of Bl upper ext showing age indeterminate brachial DVT's in both arms. Referred to ED.  Past Medical History  Diagnosis Date  . Diabetes mellitus   . Hypertension   . MI (myocardial infarction)   . Coronary artery disease   . Legally blind   . Chronic kidney disease   . Anemia   . Hypothyroid   . Gastric ulcer   . Hiatal hernia   . Dyspepsia   . UTI (lower urinary tract infection)   . H/O: GI bleed 12//13  . A-fib   . Diastolic dysfunction     Past Surgical History  Procedure Laterality Date  . Pacemaker insertion  06/29/06    Medtronic adapta  . Coronary artery bypass graft  10/27/92    LIMA to LAD,SVG to LAD second diagonal,obtuse maraginal of the CX and posterior descendingbranch of the RCA  . Cardiac catheterization  10/25/92  . Esophagogastroduodenoscopy  12/22/2011    Procedure: ESOPHAGOGASTRODUODENOSCOPY (EGD);  Surgeon: Iva Boop, MD;  Location: [facility];  Service: Endoscopy;  Laterality: N/A;  . Cardiac catheterization  11/18/03    w/grafts 100%CX LAD 80 & 100%  . Cardiac catheterization  01/24/05    diffuse disease of native vessels  . Cardiac catheterization  06/06/06    severe native CAD  . Cardioversion  06/06/06    successful    Family History  Problem Relation Age of Onset  . Breast cancer Daughter   . Diabetes Son   . Diabetes Daughter   . Heart disease Father   . Diabetes Father   . Colon polyps Daughter   . Heart attack Brother   . Diabetes Brother   . Stroke  Sister     History  Substance Use Topics  . Smoking status: Never Smoker   . Smokeless tobacco: Never Used  . Alcohol Use: No    OB History   Grav Para Term Preterm Abortions TAB SAB Ect Mult Living                  Review of Systems  Constitutional: Negative for fever and chills.  Respiratory: Negative for cough and shortness of breath.   Cardiovascular: Positive for leg swelling. Negative for chest pain and palpitations.  Gastrointestinal: Negative for nausea, abdominal pain, diarrhea and constipation.  Genitourinary: Negative for dysuria and flank pain.  Musculoskeletal: Positive for myalgias and joint swelling.  Skin: Negative for rash and wound.  Neurological: Negative for dizziness, syncope, weakness, numbness and headaches.  All other systems reviewed and are negative.    Allergies  Darvon; Digoxin and related; Nitrostat; Other; Penicillins; Percocet; Percodan; and Vicodin  Home Medications   Current Outpatient Rx  Name  Route  Sig  Dispense  Refill  . aspirin 81 MG chewable tablet   Oral   Chew 81 mg by mouth daily.         . feeding supplement (ENSURE COMPLETE) LIQD   Oral   Take 237 mLs by mouth daily.         Marland Kitchen  ferrous sulfate 325 (65 FE) MG tablet   Oral   Take 325 mg by mouth daily with breakfast.         . folic acid (FOLVITE) 1 MG tablet   Oral   Take 1 mg by mouth daily.         Marland Kitchen gabapentin (NEURONTIN) 100 MG capsule   Oral   Take 100 mg by mouth 2 (two) times daily.          . isosorbide mononitrate (IMDUR) 30 MG 24 hr tablet   Oral   Take 15 mg by mouth daily.         Marland Kitchen levothyroxine (SYNTHROID, LEVOTHROID) 50 MCG tablet   Oral   Take 50 mcg by mouth daily.         Marland Kitchen loratadine (CLARITIN) 10 MG tablet   Oral   Take 10 mg by mouth daily. For allergies         . metoprolol tartrate (LOPRESSOR) 25 MG tablet   Oral   Take 12.5 mg by mouth 2 (two) times daily.         . nitroGLYCERIN (NITRODUR - DOSED IN MG/24 HR)  0.2 mg/hr   Transdermal   Place 1 patch (0.2 mg total) onto the skin daily as needed (chest pain).   30 patch   0   . pantoprazole (PROTONIX) 40 MG tablet   Oral   Take 1 tablet (40 mg total) by mouth 2 (two) times daily before a meal.   60 tablet   1   . potassium chloride SA (K-DUR,KLOR-CON) 20 MEQ tablet   Oral   Take 20 mEq by mouth daily.         . rosuvastatin (CRESTOR) 10 MG tablet   Oral   Take 10 mg by mouth daily.         Marland Kitchen torsemide (DEMADEX) 20 MG tablet   Oral   Take 20 mg by mouth every other day.         . Vitamin D, Ergocalciferol, (DRISDOL) 50000 UNITS CAPS   Oral   Take 50,000 Units by mouth every 7 (seven) days. Mondays         . zolpidem (AMBIEN) 10 MG tablet   Oral   Take 5 mg by mouth at bedtime as needed. sleep           BP 176/87  Pulse 86  Temp(Src) 97.9 F (36.6 C) (Oral)  Resp 12  Ht 5\' 4"  (1.626 m)  Wt 196 lb (88.905 kg)  BMI 33.63 kg/m2  SpO2 99%  Physical Exam  Nursing note and vitals reviewed. Constitutional: She is oriented to person, place, and time. She appears well-developed and well-nourished. No distress.  HENT:  Head: Normocephalic and atraumatic.  Mouth/Throat: Oropharynx is clear and moist.  Eyes: EOM are normal. Pupils are equal, round, and reactive to light.  Neck: Normal range of motion. Neck supple.  Cardiovascular: Normal rate and regular rhythm.   Pulmonary/Chest: Effort normal and breath sounds normal. No respiratory distress. She has no wheezes. She has no rales. She exhibits no tenderness.  L chest wall pacemaker  Abdominal: Soft. Bowel sounds are normal. She exhibits no distension and no mass. There is no tenderness. There is no rebound and no guarding.  Musculoskeletal: Normal range of motion. She exhibits tenderness (Mild TTP bl upper arms and L calf. Good distal pulses. Mild edema. ). She exhibits no edema.  Neurological: She is alert and oriented to person, place,  and time.  5/5 motor in all ext,  sensation intact  Skin: Skin is warm and dry. No rash noted. No erythema.  Psychiatric: She has a normal mood and affect. Her behavior is normal.    ED Course  Procedures (including critical care time)  Labs Reviewed  CBC WITH DIFFERENTIAL - Abnormal; Notable for the following:    Platelets 147 (*)    All other components within normal limits  COMPREHENSIVE METABOLIC PANEL - Abnormal; Notable for the following:    Glucose, Bld 167 (*)    Albumin 3.4 (*)    GFR calc non Af Amer 44 (*)    GFR calc Af Amer 51 (*)    All other components within normal limits  TROPONIN I  PROTIME-INR  URINALYSIS, ROUTINE W REFLEX MICROSCOPIC   No results found.   1. DVT, lower extremity, left   2. DVT of upper extremity (deep vein thrombosis), bilateral       Date: 06/13/2012  Rate: 85  Rhythm: normal sinus rhythm  QRS Axis: indeterminate  Intervals: PR, QRS prolonged  ST/T Wave abnormalities: indeterminate  Conduction Disutrbances:first-degree A-V block  and nonspecific intraventricular conduction delay  Narrative Interpretation:   Old EKG Reviewed: changes noted nonsecific IVCD on prev EKG though pt no longer in afib.    MDM   Discussed with [facility] who will admit       Loren Racer, MD 06/13/12 [phone]

## 2012-06-13 NOTE — ED Notes (Signed)
daughter at bedside

## 2012-06-13 NOTE — ED Notes (Signed)
Pt reports that she had a PICC line removed Feb 7 from her (R) arm.  States that she she has had arm pain and swelling to the arm since that time.  Slight swelling noted to (R) hand and arm.  States that her (L) arm is also hurting.  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  Pulses, sensation and cap refill <3 in bilateral arms.

## 2012-06-13 NOTE — Progress Notes (Signed)
*  Preliminary Results* Bilateral upper extremity venous duplex completed. The right upper extremity is positive for subacute vs. chronic deep vein thrombosis involving the right brachial veins. The left upper extremity is positive for subacute vs. age indeterminate deep vein thrombosis involving the left brachial vein.  Preliminary results discussed with [place] of Dr.Dean's office, who discussed results with Dr.Dean.  As per Dr.Dean's request, the patient will be taken to the emergency room for further evaluation.  06/13/2012 10:13 AM Gertie Fey, RDMS, RDCS

## 2012-06-13 NOTE — Progress Notes (Addendum)
*  Preliminary Results* Left lower extremity venous duplex completed. The left saphenofemoral junction, common femoral, and proximal femoral vein are partially compressible with partial color flow, suggestive of a possible hyperacute deep vein thrombosis. There is no evidence of left Baker's cyst.  Preliminary results discussed with Dr.[place]elverton.  06/13/2012 3:44 PM Gertie Fey, RDMS, RDCS

## 2012-06-13 NOTE — H&P (Addendum)
[facility] History and Physical  [name]:[id number] DOB: 29-May-1920 DOA: 06/13/2012  Referring physician: Emergency department PCP: Willey Blade, MD  Specialists:   Chief Complaint: Arm pain  HPI: [name] is a 79 y.o. female who was referred to the emergency department by her primary care provider after outpatient upper extremity Dopplers demonstrated bilateral DVTs of the upper extremities. Apparently, patient had noticed upper extremity swelling and pain since a PICC line was placed in February 2014. Today, the patient denies any shortness of breath or chest pain. However, patient did complain of left lower extremity swelling. Lower extremity Dopplers were obtained which demonstrated evidence suggestive of a "possible hyper acute deep vein thrombosis" of the left saphenofemoral junction, common femoral, and proximal femoral vein. The hospital service was subsequently consult if her further recommendations and for possible admission.  Review of Systems: notable for upper extremity swelling and left lower extremity swelling, all other review systems reviewed and are negative  Past Medical History  Diagnosis Date  . Diabetes mellitus   . Hypertension   . MI (myocardial infarction)   . Coronary artery disease   . Legally blind   . Chronic kidney disease   . Anemia   . Hypothyroid   . Gastric ulcer   . Hiatal hernia   . Dyspepsia   . UTI (lower urinary tract infection)   . H/O: GI bleed 12//13  . A-fib   . Diastolic dysfunction    Past Surgical History  Procedure Laterality Date  . Pacemaker insertion  06/29/06    Medtronic adapta  . Coronary artery bypass graft  10/27/92    LIMA to LAD,SVG to LAD second diagonal,obtuse maraginal of the CX and posterior descendingbranch of the RCA  . Cardiac catheterization  10/25/92  . Esophagogastroduodenoscopy  12/22/2011    Procedure: ESOPHAGOGASTRODUODENOSCOPY (EGD);  Surgeon: Iva Boop, MD;  Location: [facility]  END[facility];  Service: Endoscopy;  Laterality: N/A;  . Cardiac catheterization  11/18/03    w/grafts 100%CX LAD 80 & 100%  . Cardiac catheterization  01/24/05    diffuse disease of native vessels  . Cardiac catheterization  06/06/06    severe native CAD  . Cardioversion  06/06/06    successful   Social History:  reports that she has never smoked. She has never used smokeless tobacco. She reports that she does not drink alcohol or use illicit drugs.   Allergies  Allergen Reactions  . Darvon   . Digoxin And Related Nausea Only  . Nitrostat (Nitroglycerin)   . Other     Tylenol #3  . Penicillins Other (See Comments)    Was told had allergy from childhood... Unknown reaction  . Percocet (Oxycodone-Acetaminophen)   . Percodan (Oxycodone-Aspirin)   . Vicodin (Hydrocodone-Acetaminophen)     Family History  Problem Relation Age of Onset  . Breast cancer Daughter   . Diabetes Son   . Diabetes Daughter   . Heart disease Father   . Diabetes Father   . Colon polyps Daughter   . Heart attack Brother   . Diabetes Brother   . Stroke Sister      Prior to Admission medications   Medication Sig Start Date End Date Taking? Authorizing Provider  aspirin 81 MG chewable tablet Chew 81 mg by mouth daily.   Yes Historical Provider, MD  feeding supplement (ENSURE COMPLETE) LIQD Take 237 mLs by mouth daily. 12/26/11  Yes Lizbeth Bark, FNP  ferrous sulfate 325 (65 FE) MG tablet Take  325 mg by mouth daily with breakfast.   Yes Historical Provider, MD  folic acid (FOLVITE) 1 MG tablet Take 1 mg by mouth daily.   Yes Historical Provider, MD  gabapentin (NEURONTIN) 100 MG capsule Take 100 mg by mouth 2 (two) times daily.    Yes Historical Provider, MD  isosorbide mononitrate (IMDUR) 30 MG 24 hr tablet Take 15 mg by mouth daily.   Yes Historical Provider, MD  levothyroxine (SYNTHROID, LEVOTHROID) 50 MCG tablet Take 50 mcg by mouth daily.   Yes Historical Provider, MD  loratadine (CLARITIN) 10 MG tablet Take  10 mg by mouth daily. For allergies   Yes Historical Provider, MD  metoprolol tartrate (LOPRESSOR) 25 MG tablet Take 12.5 mg by mouth 2 (two) times daily.   Yes Historical Provider, MD  nitroGLYCERIN (NITRODUR - DOSED IN MG/24 HR) 0.2 mg/hr Place 1 patch (0.2 mg total) onto the skin daily as needed (chest pain). 02/26/12  Yes [name], MD  pantoprazole (PROTONIX) 40 MG tablet Take 1 tablet (40 mg total) by mouth 2 (two) times daily before a meal. 12/26/11  Yes Lizbeth Bark, FNP  potassium chloride SA (K-DUR,KLOR-CON) 20 MEQ tablet Take 20 mEq by mouth daily.   Yes Historical Provider, MD  rosuvastatin (CRESTOR) 10 MG tablet Take 10 mg by mouth daily.   Yes Historical Provider, MD  torsemide (DEMADEX) 20 MG tablet Take 20 mg by mouth every other day.   Yes Historical Provider, MD  Vitamin D, Ergocalciferol, (DRISDOL) 50000 UNITS CAPS Take 50,000 Units by mouth every 7 (seven) days. Mondays   Yes Historical Provider, MD  zolpidem (AMBIEN) 10 MG tablet Take 5 mg by mouth at bedtime as needed. sleep   Yes Historical Provider, MD   Physical Exam: Filed Vitals:   06/13/12 1040 06/13/12 1132  BP: 165/83 176/87  Pulse: 77 86  Temp: 97.9 F (36.6 C)   TempSrc: Oral   Resp: 18 12  Height: 5\' 4"  (1.626 m)   Weight: 88.905 kg (196 lb)   SpO2: 97% 99%     General:   Patient is awake in no apparent distress  Eyes:  pupils are equal round and reactive  ENT:  MUCOUS membranes moist  Neck:  trachea midline, neck supple  Cardiovascular:  regular, S1-S2  Respiratory:  normal respiratory effort, no crackles or wheezing  Abdomen:  soft, positive bowel sounds  Skin:  normal skin turgor, no abnormal skin lesions seen  Musculoskeletal:  no clubbing or cyanosis, distally perfused  Psychiatric:  appears normal  Neurologic:  strength and sensation are intact  Labs on Admission:  Basic Metabolic Panel:  Recent Labs Lab 06/13/12 1245  NA 138  K 4.4  CL 101  CO2 27  GLUCOSE 167*  BUN 12   CREATININE 1.08  CALCIUM 9.9   Liver Function Tests:  Recent Labs Lab 06/13/12 1245  AST 17  ALT 9  ALKPHOS 87  BILITOT 0.6  PROT 7.1  ALBUMIN 3.4*   No results found for this basename: LIPASE, AMYLASE,  in the last 168 hours No results found for this basename: AMMONIA,  in the last 168 hours CBC:  Recent Labs Lab 06/13/12 1245  WBC 5.0  NEUTROABS 2.8  HGB 13.8  HCT 43.1  MCV 90.7  PLT 147*   Cardiac Enzymes:  Recent Labs Lab 06/13/12 1245  TROPONINI <0.30    BNP (last 3 results)  Recent Labs  12/15/11 0650 12/17/11 1603 01/01/12 0435  PROBNP 1637.0* 2026.0* 2693.0*  CBG: No results found for this basename: GLUCAP,  in the last 168 hours  Radiological Exams on Admission: No results found.    Assessment/Plan Principal Problem:   DVT (deep venous thrombosis) Active Problems:   Dyspepsia and other specified disorders of function of stomach   Diabetes mellitus   Coronary artery disease, CABG in 1994 with last cath 2008 patent grafts and normal EF   Hypertension   Pacemaker, medtronic Adapta 06/29/06  secondary to symptomatic bradycardia and 2nd degree heart block   Acute LLE and Bilateral UE DVT: -We'll met the patient to a med telemetry bed. -We'll start the patient on therapeutic Coumadin with heparin bridge -We'll consult pharmacy to assist in medication dose adjustments  Hypertension: -For now, we'll continue patient's home regimen with plans to titrate accordingly. -Patient will be continued on hydralazine 5 mg every 6 hours as needed for systolic blood pressures greater than 180  Diabetes: -We will continue patient with a supplemental sliding regimen.  History of CAD: -Patient is status post CABG as well as pacemaker placement secondary to bradycardia with second degree heart block. - patient's presently asymptomatic. Will continue nitroglycerin as needed for symptoms.  GERD: -Will continue protonix status per her home  regimen    Code Status:  full (must indicate code status--if unknown or must be presumed, indicate so) Family Communication:  patient and daughter in room (indicate person spoken with, if applicable, with phone number if by telephone) Disposition Plan:  pending (indicate anticipated LOS)  Time spent:  30 minutes  Calvary Difranco K [facility] Pager [phone]  If 7PM-7AM, please contact night-coverage [email or web address] Password [facility] 06/13/2012, 4:58 PM

## 2012-06-13 NOTE — Progress Notes (Signed)
ANTICOAGULATION CONSULT NOTE - Initial Consult  Pharmacy Consult for heparin + coumadin Indication: DVT  Allergies  Allergen Reactions  . Darvon   . Digoxin And Related Nausea Only  . Nitrostat (Nitroglycerin)   . Other     Tylenol #3  . Penicillins Other (See Comments)    Was told had allergy from childhood... Unknown reaction  . Percocet (Oxycodone-Acetaminophen)   . Percodan (Oxycodone-Aspirin)   . Vicodin (Hydrocodone-Acetaminophen)     Patient Measurements: Height: 5\' 4"  (162.6 cm) Weight: 196 lb (88.905 kg) IBW/kg (Calculated) : 54.7 Heparin Dosing Weight: 75kg  Vital Signs: Temp: 97.9 F (36.6 C) (05/29 1040) Temp src: Oral (05/29 1040) BP: 176/87 mmHg (05/29 1132) Pulse Rate: 86 (05/29 1132)  Labs:  Recent Labs  06/13/12 1245  HGB 13.8  HCT 43.1  PLT 147*  LABPROT 12.2  INR 0.91  CREATININE 1.08  TROPONINI <0.30    Estimated Creatinine Clearance: 38.1 ml/min (by C-G formula based on Cr of 1.08).   Medical History: Past Medical History  Diagnosis Date  . Diabetes mellitus   . Hypertension   . MI (myocardial infarction)   . Coronary artery disease   . Legally blind   . Chronic kidney disease   . Anemia   . Hypothyroid   . Gastric ulcer   . Hiatal hernia   . Dyspepsia   . UTI (lower urinary tract infection)   . H/O: GI bleed 12//13  . A-fib   . Diastolic dysfunction     Medications:  Infusions:  . heparin    . heparin      Assessment: 71 yof presented to the ED after MD found and upper extremity DVT. Also noted with lower extremity DVT. Pt is currently not on any anticoagulants PTA. Baseline INR is 0.91, H/H 13.8/43.1, plts slightly low at 147.   Goal of Therapy:  INR 2-3 Heparin level 0.3-0.7 units/ml Monitor platelets by anticoagulation protocol: Yes   Plan:  1. Heparin bolus 4000 units IV x 1 2. Hepairn gtt 1100 units/hr 3. Check an 8 hour heparin level 4. Daily heparin level and CBC 5. Warfarin 5mg  PO x 1 tonight 6.  Daily INR 7. Warfarin education materials to patient  Brina Umeda, Drake Leach 06/13/2012,4:53 PM

## 2012-06-14 DIAGNOSIS — I10 Essential (primary) hypertension: Secondary | ICD-10-CM

## 2012-06-14 DIAGNOSIS — I82409 Acute embolism and thrombosis of unspecified deep veins of unspecified lower extremity: Secondary | ICD-10-CM

## 2012-06-14 DIAGNOSIS — E119 Type 2 diabetes mellitus without complications: Secondary | ICD-10-CM

## 2012-06-14 DIAGNOSIS — I2510 Atherosclerotic heart disease of native coronary artery without angina pectoris: Secondary | ICD-10-CM

## 2012-06-14 LAB — COMPREHENSIVE METABOLIC PANEL
ALT: 7 U/L (ref 0–35)
Alkaline Phosphatase: 70 U/L (ref 39–117)
CO2: 26 mEq/L (ref 19–32)
Chloride: 102 mEq/L (ref 96–112)
GFR calc Af Amer: 52 mL/min — ABNORMAL LOW (ref >90)
GFR calc non Af Amer: 45 mL/min — ABNORMAL LOW (ref >90)
Glucose, Bld: 172 mg/dL — ABNORMAL HIGH (ref 70–99)
Potassium: 4.3 mEq/L (ref 3.5–5.1)
Sodium: 137 mEq/L (ref 135–145)

## 2012-06-14 LAB — GLUCOSE, CAPILLARY
Glucose-Capillary: 147 mg/dL — ABNORMAL HIGH (ref 70–99)
Glucose-Capillary: 182 mg/dL — ABNORMAL HIGH (ref 70–99)
Glucose-Capillary: 183 mg/dL — ABNORMAL HIGH (ref 70–99)
Glucose-Capillary: 216 mg/dL — ABNORMAL HIGH (ref 70–99)

## 2012-06-14 LAB — HEMOGLOBIN A1C: Hgb A1c MFr Bld: 6.9 % — ABNORMAL HIGH (ref <5.7)

## 2012-06-14 LAB — PROTIME-INR
INR: 1.06 (ref 0.00–1.49)
Prothrombin Time: 13.7 seconds (ref 11.6–15.2)

## 2012-06-14 LAB — CBC
MCH: 29.5 pg (ref 26.0–34.0)
MCHC: 33.2 g/dL (ref 30.0–36.0)
RDW: 14 % (ref 11.5–15.5)

## 2012-06-14 MED ORDER — WARFARIN SODIUM 5 MG PO TABS
5.0000 mg | ORAL_TABLET | Freq: Once | ORAL | Status: AC
  Administered 2012-06-14: 5 mg via ORAL
  Filled 2012-06-14: qty 1

## 2012-06-14 NOTE — Progress Notes (Signed)
ANTICOAGULATION CONSULT NOTE  Pharmacy Consult for heparin and Coumadin Indication: DVT  Allergies  Allergen Reactions  . Darvon   . Digoxin And Related Nausea Only  . Nitrostat (Nitroglycerin)   . Other     Tylenol #3  . Penicillins Other (See Comments)    Was told had allergy from childhood... Unknown reaction  . Percocet (Oxycodone-Acetaminophen)   . Percodan (Oxycodone-Aspirin)   . Vicodin (Hydrocodone-Acetaminophen)     Patient Measurements: Height: 5\' 4"  (162.6 cm) Weight: 192 lb 0.3 oz (87.1 kg) IBW/kg (Calculated) : 54.7 Heparin Dosing Weight: 75kg  Vital Signs: Temp: 98.5 F (36.9 C) (05/30 0545) Temp src: Oral (05/30 0545) BP: 97/54 mmHg (05/30 0545) Pulse Rate: 62 (05/30 0545)  Labs:  Recent Labs  06/13/12 1245 06/14/12 0530  HGB 13.8 12.7  HCT 43.1 38.3  PLT 147* 140*  LABPROT 12.2 13.7  INR 0.91 1.06  HEPARINUNFRC  --  0.63  CREATININE 1.08  --   TROPONINI <0.30  --     Estimated Creatinine Clearance: 37.7 ml/min (by C-G formula based on Cr of 1.08).  Assessment: 79 yo female with DVT for anticoagulation  Goal of Therapy:  INR 2-3 Heparin level 0.3-0.7 units/ml Monitor platelets by anticoagulation protocol: Yes   Plan:  Continue Heparin at current rate  Recheck level at noon today to verify Coumadin 5 mg today   Eddie Candle 06/14/2012,6:04 AM

## 2012-06-14 NOTE — Progress Notes (Signed)
Utilization Review Completed.Tandy Lewin T5/30/2014  

## 2012-06-14 NOTE — Progress Notes (Signed)
ANTICOAGULATION CONSULT NOTE - Follow Up Consult  Pharmacy Consult for Heparin --> Coumadin Indication: DVT  Allergies  Allergen Reactions  . Darvon   . Digoxin And Related Nausea Only  . Nitrostat (Nitroglycerin)   . Other     Tylenol #3  . Penicillins Other (See Comments)    Was told had allergy from childhood... Unknown reaction  . Percocet (Oxycodone-Acetaminophen)   . Percodan (Oxycodone-Aspirin)   . Vicodin (Hydrocodone-Acetaminophen)     Patient Measurements: Height: 5\' 4"  (162.6 cm) Weight: 192 lb 0.3 oz (87.1 kg) IBW/kg (Calculated) : 54.7 Heparin Dosing Weight: 75 kg  Vital Signs: Temp: 98.5 F (36.9 C) (05/30 0545) Temp src: Oral (05/30 0545) BP: 97/54 mmHg (05/30 0545) Pulse Rate: 62 (05/30 0545)  Labs:  Recent Labs  06/13/12 1245 06/14/12 0530 06/14/12 1229  HGB 13.8 12.7  --   HCT 43.1 38.3  --   PLT 147* 140*  --   LABPROT 12.2 13.7  --   INR 0.91 1.06  --   HEPARINUNFRC  --  0.63 0.56  CREATININE 1.08 1.07  --   TROPONINI <0.30  --   --     Estimated Creatinine Clearance: 38.1 ml/min (by C-G formula based on Cr of 1.07).  Medications:  Infusions:  . heparin 1,100 Units/hr (06/13/12 2146)   Assessment: 79 y/o female on heparin to bridge Coumadin for UE and LE DVT. Heparin level is 0.56. INR is subtherapeutic after first dose. No bleeding noted, H/H are stable, platelets are low - will watch.  Today is day 2 of heparin to Coumadin bridge. Bridge to continue a minimum of 5 days AND until INR >= 2 for 24 hours.  Goal of Therapy:  INR 2-3 Heparin level 0.3-0.7 units/ml Monitor platelets by anticoagulation protocol: Yes   Plan:  -Continue heparin drip at 1100 units/hr -Coumadin as previously ordered -Daily heparin level, CBC, INR -Monitor for signs/symptoms of bleeding  [facility] [place].D., BCPS Clinical Pharmacist Pager: [phone] 06/14/2012 2:10 PM

## 2012-06-14 NOTE — Progress Notes (Signed)
[facility] PROGRESS NOTE  [name] ZOX:[id number] DOB: May 01, 1920 DOA: 06/13/2012 PCP: Willey Blade, MD  Assessment/Plan: 1. Acute LLE and bilateral UE DVT:  - admitted to tele bed and started heron IV Heparin and coumadin dosing asper pharmacy.  - provocative factorin the UE DVT appears to be the PICC line, could not explain the LLE DVT.  - pt and family understand the risk of Bleeding being on anticoagulation.  -  2. Hypothyroidism: resume home dose of synthroid.   3. GERD: RESUME protonix.   4. Hypertension: controlled. Resume home medications.   5. DM: her last hgba1c is 6.8. On  SSI.   6. CAD; she is on aspirin and plavix , plavix was held for anemia . She never underwent colonoscopy because of her age.    Code Status: full code Family Communication: daughter Lavone Orn, left a message on the phone for her grand daughter.  Disposition Plan: pending   Consultants:  none Procedures:  none  Antibiotics:  none  HPI/Subjective: Comfortable.   Objective: Filed Vitals:   06/13/12 1731 06/13/12 2104 06/14/12 0048 06/14/12 0545  BP: 137/90 149/87 149/78 97/54  Pulse: 90 84 102 62  Temp: 98.1 F (36.7 C) 98.8 F (37.1 C) 98 F (36.7 C) 98.5 F (36.9 C)  TempSrc: Oral Oral Oral Oral  Resp: 16 18 18 18   Height:      Weight:  87.1 kg (192 lb 0.3 oz)    SpO2: 98% 97% 97% 96%    Intake/Output Summary (Last 24 hours) at 06/14/12 1444 Last data filed at 06/14/12 1233  Gross per 24 hour  Intake 358.67 ml  Output      0 ml  Net 358.67 ml   Filed Weights   06/13/12 1040 06/13/12 2104  Weight: 88.905 kg (196 lb) 87.1 kg (192 lb 0.3 oz)    Exam: Cardiovascular: regular, S1-S2  Respiratory: normal respiratory effort, no crackles or wheezing  Abdomen: soft, positive bowel sounds  Musculoskeletal: no clubbing or cyanosis, distally perfused   Neurologic: strength and sensation are intact     Data Reviewed: Basic Metabolic Panel:  Recent  Labs Lab 06/13/12 1245 06/14/12 0530  NA 138 137  K 4.4 4.3  CL 101 102  CO2 27 26  GLUCOSE 167* 172*  BUN 12 12  CREATININE 1.08 1.07  CALCIUM 9.9 9.2   Liver Function Tests:  Recent Labs Lab 06/13/12 1245 06/14/12 0530  AST 17 14  ALT 9 7  ALKPHOS 87 70  BILITOT 0.6 0.5  PROT 7.1 6.0  ALBUMIN 3.4* 2.8*   No results found for this basename: LIPASE, AMYLASE,  in the last 168 hours No results found for this basename: AMMONIA,  in the last 168 hours CBC:  Recent Labs Lab 06/13/12 1245 06/14/12 0530  WBC 5.0 5.2  NEUTROABS 2.8  --   HGB 13.8 12.7  HCT 43.1 38.3  MCV 90.7 88.9  PLT 147* 140*   Cardiac Enzymes:  Recent Labs Lab 06/13/12 1245  TROPONINI <0.30   BNP (last 3 results)  Recent Labs  12/15/11 0650 12/17/11 1603 01/01/12 0435  PROBNP 1637.0* 2026.0* 2693.0*   CBG:  Recent Labs Lab 06/13/12 2243 06/14/12 0607 06/14/12 0754 06/14/12 1231  GLUCAP 175* 149* 147* 182*    No results found for this or any previous visit (from the past 240 hour(s)).   Studies: No results found.  Scheduled Meds: . aspirin  81 mg Oral Daily  . atorvastatin  10 mg Oral  [id number]  . feeding supplement  237 mL Oral Q24H  . ferrous sulfate  325 mg Oral Q breakfast  . folic acid  1 mg Oral Daily  . gabapentin  100 mg Oral BID  . insulin aspart  0-5 Units Subcutaneous QHS  . insulin aspart  0-9 Units Subcutaneous TID WC  . isosorbide mononitrate  15 mg Oral Daily  . levothyroxine  50 mcg Oral QAC breakfast  . loratadine  10 mg Oral Daily  . metoprolol tartrate  12.5 mg Oral BID  . pantoprazole  40 mg Oral BID AC  . potassium chloride SA  20 mEq Oral Daily  . sodium chloride  3 mL Intravenous Q12H  . [START ON 06/15/2012] torsemide  20 mg Oral QODAY  . [START ON 06/17/2012] Vitamin D (Ergocalciferol)  50,000 Units Oral Q7 days  . warfarin  5 mg Oral ONCE-1800  . Warfarin - Pharmacist Dosing Inpatient   Does not apply q1800   Continuous Infusions: . heparin  1,100 Units/hr (06/13/12 2146)    Principal Problem:   DVT (deep venous thrombosis) Active Problems:   Dyspepsia and other specified disorders of function of stomach   Diabetes mellitus   Coronary artery disease, CABG in 1994 with last cath 2008 patent grafts and normal EF   Hypertension   Pacemaker, medtronic Adapta 06/29/06  secondary to symptomatic bradycardia and 2nd degree heart block    Time spent: 30 minutes   Spence Soberano  [facility] Pager [phone] If 7PM-7AM, please contact night-coverage at [email or web address], password [facility] 06/14/2012, 2:44 PM  LOS: 1 day

## 2012-06-15 ENCOUNTER — Encounter (HOSPITAL_COMMUNITY): Payer: Self-pay | Admitting: Radiology

## 2012-06-15 ENCOUNTER — Inpatient Hospital Stay (HOSPITAL_COMMUNITY): Payer: Medicare Other

## 2012-06-15 DIAGNOSIS — K3189 Other diseases of stomach and duodenum: Secondary | ICD-10-CM

## 2012-06-15 DIAGNOSIS — R079 Chest pain, unspecified: Secondary | ICD-10-CM

## 2012-06-15 LAB — GLUCOSE, CAPILLARY: Glucose-Capillary: 155 mg/dL — ABNORMAL HIGH (ref 70–99)

## 2012-06-15 LAB — URINALYSIS, ROUTINE W REFLEX MICROSCOPIC
Bilirubin Urine: NEGATIVE
Leukocytes, UA: NEGATIVE
Nitrite: NEGATIVE
Specific Gravity, Urine: 1.015 (ref 1.005–1.030)
pH: 6 (ref 5.0–8.0)

## 2012-06-15 LAB — PROTIME-INR: Prothrombin Time: 14.3 seconds (ref 11.6–15.2)

## 2012-06-15 LAB — CBC
HCT: 36.5 % (ref 36.0–46.0)
MCH: 29.4 pg (ref 26.0–34.0)
MCHC: 32.9 g/dL (ref 30.0–36.0)
RDW: 14.3 % (ref 11.5–15.5)

## 2012-06-15 LAB — TROPONIN I: Troponin I: <0.3 ng/mL (ref <0.30)

## 2012-06-15 MED ORDER — GLUCERNA SHAKE PO LIQD
237.0000 mL | ORAL | Status: DC
  Administered 2012-06-17 – 2012-06-18 (×2): 237 mL via ORAL

## 2012-06-15 MED ORDER — IOHEXOL 350 MG/ML SOLN
60.0000 mL | Freq: Once | INTRAVENOUS | Status: AC | PRN
  Administered 2012-06-15: 60 mL via INTRAVENOUS

## 2012-06-15 MED ORDER — TRAMADOL HCL 50 MG PO TABS
50.0000 mg | ORAL_TABLET | Freq: Four times a day (QID) | ORAL | Status: DC | PRN
  Administered 2012-06-17: 50 mg via ORAL
  Filled 2012-06-15: qty 1

## 2012-06-15 MED ORDER — WARFARIN SODIUM 7.5 MG PO TABS
7.5000 mg | ORAL_TABLET | Freq: Once | ORAL | Status: AC
  Administered 2012-06-15: 7.5 mg via ORAL
  Filled 2012-06-15: qty 1

## 2012-06-15 MED ORDER — MORPHINE SULFATE 2 MG/ML IJ SOLN: 0.5000 mg | INTRAMUSCULAR | Status: DC | PRN

## 2012-06-15 NOTE — Progress Notes (Signed)
[facility] PROGRESS NOTE  [name] AOZ:[id number] DOB: [birth year] DOA: 06/13/2012 PCP: Willey Blade, MD  Assessment/Plan: 1. Acute LLE and bilateral UE DVT:  - admitted to tele bed and started heron IV Heparin and coumadin dosing asper pharmacy.  - provocative factorin the UE DVT appears to be the PICC line, could not explain the LLE DVT.  - pt and family understand the risk of Bleeding being on anticoagulation.  -  2. Hypothyroidism: resume home dose of synthroid.   3. GERD: RESUME protonix.   4. Hypertension: controlled. Resume home medications.   5. DM: her last hgba1c is 6.9. On  SSI.  CBG (last 3)   Recent Labs  06/14/12 2138 06/15/12 0619 06/15/12 1113  GLUCAP 216* 153* 161*      6. CAD; she is on aspirin and plavix , plavix was held for anemia . She never underwent colonoscopy because of her age.   7. Chest pain, both right and left sided, atypical in nature, but in view of her DVT's will get a CT angiogram of the lungs to evaluate for PE, 12 lead ekg and troponin. Morphine is ordered.   8. DVT PROPHYLAXIS: on therapeutic anticoagulation.    Code Status: full code Family Communication: daughter Lavone Orn, left a message on the phone for her grand daughter.  Disposition Plan: pending   Consultants:  none Procedures:  none  Antibiotics:  none  HPI/Subjective: Right sided chest pain on deep inspiration.    Objective: Filed Vitals:   06/14/12 1739 06/14/12 2141 06/15/12 0446 06/15/12 1425  BP: 92/60 90/47 124/63 124/68  Pulse: 69 57 72 84  Temp:  98.9 F (37.2 C) 98.5 F (36.9 C) 98.1 F (36.7 C)  TempSrc:  Oral Oral Oral  Resp:  18 19 18   Height:      Weight:      SpO2:  95% 98% 98%    Intake/Output Summary (Last 24 hours) at 06/15/12 1456 Last data filed at 06/14/12 1719  Gross per 24 hour  Intake 702.43 ml  Output      0 ml  Net 702.43 ml   Filed Weights   06/13/12 1040 06/13/12 2104  Weight: 88.905 kg (196 lb)  87.1 kg (192 lb 0.3 oz)    Exam: Cardiovascular: regular, S1-S2  Respiratory: normal respiratory effort, no crackles or wheezing  Abdomen: soft, positive bowel sounds  Musculoskeletal: no clubbing or cyanosis, distally perfused   Neurologic: strength and sensation are intact     Data Reviewed: Basic Metabolic Panel:  Recent Labs Lab 06/13/12 1245 06/14/12 0530  NA 138 137  K 4.4 4.3  CL 101 102  CO2 27 26  GLUCOSE 167* 172*  BUN 12 12  CREATININE 1.08 1.07  CALCIUM 9.9 9.2   Liver Function Tests:  Recent Labs Lab 06/13/12 1245 06/14/12 0530  AST 17 14  ALT 9 7  ALKPHOS 87 70  BILITOT 0.6 0.5  PROT 7.1 6.0  ALBUMIN 3.4* 2.8*   No results found for this basename: LIPASE, AMYLASE,  in the last 168 hours No results found for this basename: AMMONIA,  in the last 168 hours CBC:  Recent Labs Lab 06/13/12 1245 06/14/12 0530 06/15/12 0939  WBC 5.0 5.2 5.1  NEUTROABS 2.8  --   --   HGB 13.8 12.7 12.0  HCT 43.1 38.3 36.5  MCV 90.7 88.9 89.5  PLT 147* 140* 126*   Cardiac Enzymes:  Recent Labs Lab 06/13/12 1245  TROPONINI <0.30   BNP (  last 3 results)  Recent Labs  12/15/11 0650 12/17/11 1603 01/01/12 0435  PROBNP 1637.0* 2026.0* 2693.0*   CBG:  Recent Labs Lab 06/14/12 1231 06/14/12 1627 06/14/12 2138 06/15/12 0619 06/15/12 1113  GLUCAP 182* 183* 216* 153* 161*    No results found for this or any previous visit (from the past 240 hour(s)).   Studies: No results found.  Scheduled Meds: . aspirin  81 mg Oral Daily  . atorvastatin  10 mg Oral q1800  . feeding supplement  237 mL Oral Q24H  . ferrous sulfate  325 mg Oral Q breakfast  . folic acid  1 mg Oral Daily  . gabapentin  100 mg Oral BID  . insulin aspart  0-5 Units Subcutaneous QHS  . insulin aspart  0-9 Units Subcutaneous TID WC  . isosorbide mononitrate  15 mg Oral Daily  . levothyroxine  50 mcg Oral QAC breakfast  . loratadine  10 mg Oral Daily  . metoprolol tartrate  12.5  mg Oral BID  . pantoprazole  40 mg Oral BID AC  . potassium chloride SA  20 mEq Oral Daily  . sodium chloride  3 mL Intravenous Q12H  . torsemide  20 mg Oral QODAY  . [START ON 06/17/2012] Vitamin D (Ergocalciferol)  50,000 Units Oral Q7 days  . warfarin  7.5 mg Oral ONCE-1800  . Warfarin - Pharmacist Dosing Inpatient   Does not apply q1800   Continuous Infusions: . heparin 1,100 Units/hr (06/14/12 1504)    Principal Problem:   DVT (deep venous thrombosis) Active Problems:   Dyspepsia and other specified disorders of function of stomach   Diabetes mellitus   Coronary artery disease, CABG in 1994 with last cath 2008 patent grafts and normal EF   Hypertension   Pacemaker, medtronic Adapta 06/29/06  secondary to symptomatic bradycardia and 2nd degree heart block    Time spent: 30 minutes   Aizik Reh  [facility] Pager [phone] If 7PM-7AM, please contact night-coverage at [email or web address], password [facility] 06/15/2012, 2:56 PM  LOS: 2 days

## 2012-06-15 NOTE — Progress Notes (Signed)
INITIAL NUTRITION ASSESSMENT  DOCUMENTATION CODES Per approved criteria  -Obesity Unspecified   INTERVENTION: 1. D/c Ensure  2. Glucerna Shake po daily, each supplement provides 220 kcal and 10 grams of protein  NUTRITION DIAGNOSIS: No current nutrition diagnosis   Goal: PO intake to meet >/=90% estimated nutrition needs. Likely met   Monitor:  PO intake, weight trends, labs   Reason for Assessment: Consult  79 y.o. female  Admitting Dx: DVT (deep venous thrombosis)  ASSESSMENT: Pt admitted with DVT of upper extremities.  RD consulted for poor po intake and pt drinks Glucerna at home.  Pt has order for Ensure at this time. With hx of DM. Family states pt drink Glucerna at home, but not the variety we provide. Explained that Glucerna provided by hopsital would be appropriate at this time. Pt has not had any recent weight loss. Pt eating well, 100% of last meal. Family will bring in Glucerna from home per their preference.   Height: Ht Readings from Last 1 Encounters:  06/13/12 5\' 4"  (1.626 m)    Weight: Wt Readings from Last 1 Encounters:  06/13/12 192 lb 0.3 oz (87.1 kg)    Ideal Body Weight: 120 lbs   % Ideal Body Weight: 160%  Wt Readings from Last 10 Encounters:  06/13/12 192 lb 0.3 oz (87.1 kg)  02/22/12 180 lb 5.4 oz (81.8 kg)  01/03/12 192 lb 14.4 oz (87.499 kg)  12/26/11 215 lb (97.523 kg)  12/26/11 215 lb (97.523 kg)  12/17/11 212 lb 1.3 oz (96.2 kg)  10/19/11 195 lb 12.8 oz (88.814 kg)  04/23/11 223 lb (101.152 kg)    Usual Body Weight: 215 lbs   % Usual Body Weight: 89%  BMI:  Body mass index is 32.94 kg/(m^2). Obesity class 1   Estimated Nutritional Needs: Kcal: 1550-1750 Protein: 85-95 gm  Fluid: 1.6-1.8 L   Skin: intact   Diet Order: Carb Control  EDUCATION NEEDS: -No education needs identified at this time   Intake/Output Summary (Last 24 hours) at 06/15/12 1501 Last data filed at 06/14/12 1719  Gross per 24 hour  Intake  702.43 ml  Output      0 ml  Net 702.43 ml    Last BM: PTA   Labs:   Recent Labs Lab 06/13/12 1245 06/14/12 0530  NA 138 137  K 4.4 4.3  CL 101 102  CO2 27 26  BUN 12 12  CREATININE 1.08 1.07  CALCIUM 9.9 9.2  GLUCOSE 167* 172*    CBG (last 3)   Recent Labs  06/14/12 2138 06/15/12 0619 06/15/12 1113  GLUCAP 216* 153* 161*   Lab Results  Component Value Date   HGBA1C 6.9* 06/13/2012     Scheduled Meds: . aspirin  81 mg Oral Daily  . atorvastatin  10 mg Oral q1800  . feeding supplement  237 mL Oral Q24H  . ferrous sulfate  325 mg Oral Q breakfast  . folic acid  1 mg Oral Daily  . gabapentin  100 mg Oral BID  . insulin aspart  0-5 Units Subcutaneous QHS  . insulin aspart  0-9 Units Subcutaneous TID WC  . isosorbide mononitrate  15 mg Oral Daily  . levothyroxine  50 mcg Oral QAC breakfast  . loratadine  10 mg Oral Daily  . metoprolol tartrate  12.5 mg Oral BID  . pantoprazole  40 mg Oral BID AC  . potassium chloride SA  20 mEq Oral Daily  . sodium chloride  3 mL  Intravenous Q12H  . torsemide  20 mg Oral QODAY  . [START ON 06/17/2012] Vitamin D (Ergocalciferol)  50,000 Units Oral Q7 days  . warfarin  7.5 mg Oral ONCE-1800  . Warfarin - Pharmacist Dosing Inpatient   Does not apply q1800    Continuous Infusions: . heparin 1,100 Units/hr (06/14/12 1504)    Past Medical History  Diagnosis Date  . Hypertension   . Coronary artery disease   . Legally blind   . Chronic kidney disease   . Anemia   . Hypothyroid   . Gastric ulcer   . Hiatal hernia   . Dyspepsia   . UTI (lower urinary tract infection)   . H/O: GI bleed 12//13  . Diastolic dysfunction   . Pacemaker   . High cholesterol   . CHF (congestive heart failure)   . Anginal pain   . MI (myocardial infarction) ? 1994  . DVT of upper extremity (deep vein thrombosis) 06/13/2012    BUE  . Seasonal allergies   . Allergy to perfume     "strong perfumes only" (06/13/2012)  . Shortness of breath      "can happen at any time" (06/13/2012)  . Type II diabetes mellitus   . History of blood transfusion 2013    "not related to OR; BP had dropped" (06/13/2012)  . GERD (gastroesophageal reflux disease)   . Daily headache   . Arthritis     "all over" (06/13/2012)  . A-fib   . 2Nd degree atrioventricular block     Past Surgical History  Procedure Laterality Date  . Esophagogastroduodenoscopy  12/22/2011    Procedure: ESOPHAGOGASTRODUODENOSCOPY (EGD);  Surgeon: Iva Boop, MD;  Location: [facility];  Service: Endoscopy;  Laterality: N/A;  . Cardioversion  06/06/06    successful  . Insert / replace / remove pacemaker  06/29/2006    Medtronic adapta  . Cardiac catheterization  10/25/92  . Cardiac catheterization  11/18/03    w/grafts 100%CX LAD 80 & 100%  . Cardiac catheterization  01/24/05    diffuse disease of native vessels  . Cardiac catheterization  06/06/06    severe native CAD  . Coronary artery bypass graft  10/27/92    LIMA to LAD,SVG to LAD second diagonal,obtuse maraginal of the CX and posterior descendingbranch of the RCA  . Cataract extraction w/ intraocular lens  implant, bilateral Bilateral   . Refractive surgery Bilateral     Clarene Duke RD, LDN Pager [phone] After Hours pager [phone]

## 2012-06-15 NOTE — Progress Notes (Signed)
ANTICOAGULATION CONSULT NOTE - Follow Up Consult  Pharmacy Consult for Heparin --> Coumadin Indication: DVT  Allergies  Allergen Reactions  . Darvon   . Digoxin And Related Nausea Only  . Nitrostat (Nitroglycerin)   . Other     Tylenol #3  . Penicillins Other (See Comments)    Was told had allergy from childhood... Unknown reaction  . Percocet (Oxycodone-Acetaminophen)   . Percodan (Oxycodone-Aspirin)   . Vicodin (Hydrocodone-Acetaminophen)    Labs:  Recent Labs  06/13/12 1245 06/14/12 0530 06/14/12 1229 06/15/12 0939  HGB 13.8 12.7  --  12.0  HCT 43.1 38.3  --  36.5  PLT 147* 140*  --  126*  LABPROT 12.2 13.7  --  14.3  INR 0.91 1.06  --  1.13  HEPARINUNFRC  --  0.63 0.56 0.60  CREATININE 1.08 1.07  --   --   TROPONINI <0.30  --   --   --     Estimated Creatinine Clearance: 38.1 ml/min (by C-G formula based on Cr of 1.07).  Medications:  Infusions:  . heparin 1,100 Units/hr (06/14/12 1504)   Assessment: 79 y/o female on heparin to bridge Coumadin for UE and LE DVT. Heparin level is 0.60, INR = 1.13. No bleeding noted, H/H are stable, platelets are low - will watch.  Today is day 3 of heparin to Coumadin bridge. Bridge to continue a minimum of 5 days AND until INR >= 2 for 24 hours.  Goal of Therapy:  INR 2-3 Heparin level 0.3-0.7 units/ml Monitor platelets by anticoagulation protocol: Yes   Plan:  -Continue heparin drip at 1100 units/hr -Coumadin 7.5 mg po x 1 dose today at 1800 pm -Daily heparin level, CBC, INR -Monitor for signs/symptoms of bleeding  Thank you. Okey Regal, PharmD [phone]  06/15/2012 1:24 PM

## 2012-06-16 LAB — GLUCOSE, CAPILLARY
Glucose-Capillary: 142 mg/dL — ABNORMAL HIGH (ref 70–99)
Glucose-Capillary: 155 mg/dL — ABNORMAL HIGH (ref 70–99)

## 2012-06-16 LAB — CBC
HCT: 36.1 % (ref 36.0–46.0)
Hemoglobin: 12 g/dL (ref 12.0–15.0)
MCH: 29.7 pg (ref 26.0–34.0)
MCHC: 33.2 g/dL (ref 30.0–36.0)
MCV: 89.4 fL (ref 78.0–100.0)
RDW: 14.4 % (ref 11.5–15.5)

## 2012-06-16 LAB — HEPARIN LEVEL (UNFRACTIONATED): Heparin Unfractionated: 0.52 IU/mL (ref 0.30–0.70)

## 2012-06-16 MED ORDER — MAGNESIUM CITRATE PO SOLN
0.5000 | Freq: Once | ORAL | Status: AC
  Administered 2012-06-16: 0.5 via ORAL
  Filled 2012-06-16: qty 296

## 2012-06-16 MED ORDER — WARFARIN SODIUM 10 MG PO TABS
10.0000 mg | ORAL_TABLET | Freq: Once | ORAL | Status: AC
  Administered 2012-06-16: 10 mg via ORAL
  Filled 2012-06-16: qty 1

## 2012-06-16 NOTE — Progress Notes (Signed)
ANTICOAGULATION CONSULT NOTE - Follow Up Consult  Pharmacy Consult for Heparin --> Coumadin Indication: DVT  Allergies  Allergen Reactions  . Darvon   . Digoxin And Related Nausea Only  . Nitrostat (Nitroglycerin)   . Other     Tylenol #3  . Penicillins Other (See Comments)    Was told had allergy from childhood... Unknown reaction  . Percocet (Oxycodone-Acetaminophen)   . Percodan (Oxycodone-Aspirin)   . Vicodin (Hydrocodone-Acetaminophen)    Labs:  Recent Labs  06/13/12 1245  06/14/12 0530 06/14/12 1229 06/15/12 0939 06/15/12 1548 06/16/12 0340  HGB 13.8  --  12.7  --  12.0  --  12.0  HCT 43.1  --  38.3  --  36.5  --  36.1  PLT 147*  --  140*  --  126*  --  136*  LABPROT 12.2  --  13.7  --  14.3  --  15.0  INR 0.91  --  1.06  --  1.13  --  1.20  HEPARINUNFRC  --   < > 0.63 0.56 0.60  --  0.74*  CREATININE 1.08  --  1.07  --   --   --   --   TROPONINI <0.30  --   --   --   --  <0.30  --   < > = values in this interval not displayed.  Estimated Creatinine Clearance: 38.1 ml/min (by C-G formula based on Cr of 1.07).  Medications:  Infusions:  . heparin 1,100 Units/hr (06/15/12 1743)   Assessment: 79 y/o female on heparin to bridge Coumadin for UE and LE DVT.   Today is day 4 of heparin to Coumadin bridge. Bridge to continue a minimum of 5 days AND until INR >= 2 for 24 hours.  INR is 1.2. Heparin level (0.74) is above-goal on 1100 units/hr. No bleeding per RN.   Goal of Therapy:  INR 2-3 Heparin level 0.3-0.7 units/ml Monitor platelets by anticoagulation protocol: Yes   Plan:  1. Decrease IV heparin to 1000 units/hr.  2. Heparin level in 8 hours. 3. Coumadin 10 mg po today.   Lorre Munroe, PharmD 06/16/2012 4:45 AM

## 2012-06-16 NOTE — Progress Notes (Signed)
[facility] PROGRESS NOTE  [name] ION:[id number] DOB: 08/17/1920 DOA: 06/13/2012 PCP: Willey Blade, MD  Assessment/Plan: 1. Acute LLE and bilateral UE DVT/ bilateral PE:  - admitted to tele bed and started heron IV Heparin and coumadin dosing asper pharmacy.  - provocative factorin the UE DVT appears to be the PICC line, could not explain the LLE DVT.  - pt and family understand the risk of Bleeding being on anticoagulation.  -  2. Hypothyroidism: resume home dose of synthroid.   3. GERD: RESUME protonix.   4. Hypertension: controlled. Resume home medications.   5. DM: her last hgba1c is 6.9. On  SSI.  CBG (last 3)   Recent Labs  06/16/12 0643 06/16/12 1127 06/16/12 1603  GLUCAP 142* 101* 134*      6. CAD; she is on aspirin and plavix , plavix was held for anemia . She never underwent colonoscopy because of her age.   7. Chest pain, both right and left sided, atypical in nature, but in view of her DVT's CT ANGIO was obtained and was positive for PE,  ECHO ordered and pending.  8. DVT PROPHYLAXIS: on therapeutic anticoagulation.    Code Status: full code Family Communication: daughter atbedside,  Disposition Plan: pending   Consultants:  none Procedures:  none  Antibiotics:  none  HPI/Subjective: Right sided chest pain improved on deep inspiration.    Objective: Filed Vitals:   06/15/12 1425 06/15/12 2122 06/16/12 0417 06/16/12 1353  BP: 124/68 113/57 125/54 136/65  Pulse: 84 70 69 76  Temp: 98.1 F (36.7 C) 98.4 F (36.9 C) 98.5 F (36.9 C) 97 F (36.1 C)  TempSrc: Oral Oral Oral Oral  Resp: 18 18 20 20   Height:      Weight:      SpO2: 98% 96% 97% 97%    Intake/Output Summary (Last 24 hours) at 06/16/12 1713 Last data filed at 06/16/12 1442  Gross per 24 hour  Intake 492.45 ml  Output      0 ml  Net 492.45 ml   Filed Weights   06/13/12 1040 06/13/12 2104  Weight: 88.905 kg (196 lb) 87.1 kg (192 lb 0.3 oz)     Exam: Cardiovascular: regular, S1-S2  Respiratory: normal respiratory effort, no crackles or wheezing  Abdomen: soft, positive bowel sounds  Musculoskeletal: no clubbing or cyanosis, distally perfused   Neurologic: strength and sensation are intact     Data Reviewed: Basic Metabolic Panel:  Recent Labs Lab 06/13/12 1245 06/14/12 0530  NA 138 137  K 4.4 4.3  CL 101 102  CO2 27 26  GLUCOSE 167* 172*  BUN 12 12  CREATININE 1.08 1.07  CALCIUM 9.9 9.2   Liver Function Tests:  Recent Labs Lab 06/13/12 1245 06/14/12 0530  AST 17 14  ALT 9 7  ALKPHOS 87 70  BILITOT 0.6 0.5  PROT 7.1 6.0  ALBUMIN 3.4* 2.8*   No results found for this basename: LIPASE, AMYLASE,  in the last 168 hours No results found for this basename: AMMONIA,  in the last 168 hours CBC:  Recent Labs Lab 06/13/12 1245 06/14/12 0530 06/15/12 0939 06/16/12 0340  WBC 5.0 5.2 5.1 5.8  NEUTROABS 2.8  --   --   --   HGB 13.8 12.7 12.0 12.0  HCT 43.1 38.3 36.5 36.1  MCV 90.7 88.9 89.5 89.4  PLT 147* 140* 126* 136*   Cardiac Enzymes:  Recent Labs Lab 06/13/12 1245 06/15/12 1548  TROPONINI <0.30 <0.30  BNP (last 3 results)  Recent Labs  12/15/11 0650 12/17/11 1603 01/01/12 0435  PROBNP 1637.0* 2026.0* 2693.0*   CBG:  Recent Labs Lab 06/15/12 1658 06/15/12 2111 06/16/12 0643 06/16/12 1127 06/16/12 1603  GLUCAP 165* 155* 142* 101* 134*    No results found for this or any previous visit (from the past 240 hour(s)).   Studies: Ct Angio Chest Pe W/cm &/or Wo Cm  06/15/2012   *RADIOLOGY REPORT*  Clinical Data: Chest pain, known left lower extremity DVT, evaluate for PE.  CT ANGIOGRAPHY CHEST  Technique:  Multidetector CT imaging of the chest using the standard protocol during bolus administration of intravenous contrast. Multiplanar reconstructed images including MIPs were obtained and reviewed to evaluate the vascular anatomy.  Contrast: 60mL OMNIPAQUE IOHEXOL 350 MG/ML SOLN   Comparison: Chest radiograph dated 02/25/2012  Findings: Pulmonary emboli in the bilateral main pulmonary arteries at the bifurcations and extending into the lower lobe pulmonary arteries.  Overall clot burden is moderate.  No findings to suggest right heart strain.  Mild mosaic attenuation with scattered atelectasis.  No suspicious pulmonary nodules.  No pleural effusion or pneumothorax.  Visualized thyroid is unremarkable.  Cardiomegaly.  No pericardial effusion.  Postsurgical changes related to prior CABG.  Atherosclerotic calcifications of the aortic arch.  Visualized upper abdomen is notable for layering small gallstones. Degenerative changes of the visualized thoracolumbar spine.  IMPRESSION: Bilateral pulmonary emboli, as described above.  Overall clot burden is moderate.  No findings to suggest right heart strain.  Critical Value/emergent results were called by telephone at the time of interpretation on 06/15/2012 at 1700 hrs to Dr Blake Divine, who verbally acknowledged these results.   Original Report Authenticated By: Charline Bills, M.D.    Scheduled Meds: . aspirin  81 mg Oral Daily  . atorvastatin  10 mg Oral q1800  . feeding supplement  237 mL Oral Q24H  . ferrous sulfate  325 mg Oral Q breakfast  . folic acid  1 mg Oral Daily  . gabapentin  100 mg Oral BID  . insulin aspart  0-5 Units Subcutaneous QHS  . insulin aspart  0-9 Units Subcutaneous TID WC  . isosorbide mononitrate  15 mg Oral Daily  . levothyroxine  50 mcg Oral QAC breakfast  . loratadine  10 mg Oral Daily  . metoprolol tartrate  12.5 mg Oral BID  . pantoprazole  40 mg Oral BID AC  . potassium chloride SA  20 mEq Oral Daily  . sodium chloride  3 mL Intravenous Q12H  . torsemide  20 mg Oral QODAY  . [START ON 06/17/2012] Vitamin D (Ergocalciferol)  50,000 Units Oral Q7 days  . warfarin  10 mg Oral ONCE-1800  . Warfarin - Pharmacist Dosing Inpatient   Does not apply q1800   Continuous Infusions: . heparin 1,000 Units/hr  (06/16/12 1442)    Principal Problem:   DVT (deep venous thrombosis) Active Problems:   Dyspepsia and other specified disorders of function of stomach   Diabetes mellitus   Coronary artery disease, CABG in 1994 with last cath 2008 patent grafts and normal EF   Hypertension   Pacemaker, medtronic Adapta 06/29/06  secondary to symptomatic bradycardia and 2nd degree heart block    Time spent: 30 minutes   [name]  [facility] Pager [phone] If 7PM-7AM, please contact night-coverage at [email or web address], password [facility] 06/16/2012, 5:13 PM  LOS: 3 days

## 2012-06-16 NOTE — Progress Notes (Addendum)
ANTICOAGULATION CONSULT NOTE - Follow Up Consult  Pharmacy Consult for Heparin --> Coumadin Indication: DVT  Allergies  Allergen Reactions  . Darvon   . Digoxin And Related Nausea Only  . Nitrostat (Nitroglycerin)   . Other     Tylenol #3  . Penicillins Other (See Comments)    Was told had allergy from childhood... Unknown reaction  . Percocet (Oxycodone-Acetaminophen)   . Percodan (Oxycodone-Aspirin)   . Vicodin (Hydrocodone-Acetaminophen)    Labs:  Recent Labs  06/14/12 0530 06/14/12 1229 06/15/12 0939 06/15/12 1548 06/16/12 0340  HGB 12.7  --  12.0  --  12.0  HCT 38.3  --  36.5  --  36.1  PLT 140*  --  126*  --  136*  LABPROT 13.7  --  14.3  --  15.0  INR 1.06  --  1.13  --  1.20  HEPARINUNFRC 0.63 0.56 0.60  --  0.74*  CREATININE 1.07  --   --   --   --   TROPONINI  --   --   --  <0.30  --     Estimated Creatinine Clearance: 38.1 ml/min (by C-G formula based on Cr of 1.07).  Medications:  Infusions:  . heparin 1,000 Units/hr (06/16/12 0456)   Assessment: 79 y/o female admitted 06/13/2012  From PCP office with LE and UE swelling.  Pharmacy consulted to dose heparin to bridge Coumadin for UE and LE DVT.  Coag:  DVT:  Today is day 4 of heparin to Coumadin bridge. Bridge to continue a minimum of 5 days AND until INR >= 2 for 24 hours. INR is 1.2. Heparin level (0.74 early am, pending now) on 1000 units/hr. No bleeding per RN.   Goal of Therapy:  INR 2-3 Heparin level 0.3-0.7 units/ml Monitor platelets by anticoagulation protocol: Yes   Plan:  1. Follow up Heparin level, in process 2. Daily heparin level, CBC, INR 3. Coumadin 10 mg po today.    Thank you for allowing pharmacy to be a part of this patients care team.  Lovenia Kim Pharm.D., BCPS Clinical Pharmacist 06/16/2012 1:30 PM Pager: [phone] Phone: [phone]   1:51 PM Heparin at goal, continue at current rate.  Tarry Blayney Christine Virginia Crews

## 2012-06-17 DIAGNOSIS — I517 Cardiomegaly: Secondary | ICD-10-CM

## 2012-06-17 LAB — CBC
Hemoglobin: 11.9 g/dL — ABNORMAL LOW (ref 12.0–15.0)
MCH: 29 pg (ref 26.0–34.0)
MCV: 89.8 fL (ref 78.0–100.0)
Platelets: 137 10*3/uL — ABNORMAL LOW (ref 150–400)
RBC: 4.1 MIL/uL (ref 3.87–5.11)
WBC: 6 10*3/uL (ref 4.0–10.5)

## 2012-06-17 LAB — GLUCOSE, CAPILLARY
Glucose-Capillary: 128 mg/dL — ABNORMAL HIGH (ref 70–99)
Glucose-Capillary: 147 mg/dL — ABNORMAL HIGH (ref 70–99)
Glucose-Capillary: 161 mg/dL — ABNORMAL HIGH (ref 70–99)

## 2012-06-17 LAB — PROTIME-INR: INR: 1.5 — ABNORMAL HIGH (ref 0.00–1.49)

## 2012-06-17 MED ORDER — WARFARIN SODIUM 10 MG PO TABS
10.0000 mg | ORAL_TABLET | Freq: Once | ORAL | Status: AC
  Administered 2012-06-17: 10 mg via ORAL
  Filled 2012-06-17: qty 1

## 2012-06-17 NOTE — Progress Notes (Signed)
ANTICOAGULATION CONSULT NOTE - Follow Up Consult  Pharmacy Consult for Heparin and Coumadin Indication: LLE and bilateral UE DVT, bilateral PE  Allergies  Allergen Reactions  . Darvon   . Digoxin And Related Nausea Only  . Nitrostat (Nitroglycerin)   . Other     Tylenol #3  . Penicillins Other (See Comments)    Was told had allergy from childhood... Unknown reaction  . Percocet (Oxycodone-Acetaminophen)   . Percodan (Oxycodone-Aspirin)   . Vicodin (Hydrocodone-Acetaminophen)     Patient Measurements: Height: 5\' 4"  (162.6 cm) Weight: 192 lb 0.3 oz (87.1 kg) IBW/kg (Calculated) : 54.7 Heparin Dosing Weight: 67.6 kg  Vital Signs: Temp: 98.2 F (36.8 C) (06/02 0356) Temp src: Oral (06/02 0356) BP: 124/55 mmHg (06/02 0356) Pulse Rate: 70 (06/02 0356)  Labs:  Recent Labs  06/15/12 0939 06/15/12 1548 06/16/12 0340 06/16/12 1220 06/17/12 0520  HGB 12.0  --  12.0  --  11.9*  HCT 36.5  --  36.1  --  36.8  PLT 126*  --  136*  --  137*  LABPROT 14.3  --  15.0  --  17.7*  INR 1.13  --  1.20  --  1.50*  HEPARINUNFRC 0.60  --  0.74* 0.52 0.28*  TROPONINI  --  <0.30  --   --   --     Estimated Creatinine Clearance: 38.1 ml/min (by C-G formula based on Cr of 1.07).  Assessment:   Day # 5 overlap Heparin and Coumadin for bilateral PE, bilateral UE DVT and LLE DVT.   Heparin level is just below goal today on 1000 units/hr drip.  INR is now increasing towards goal after Coumadin 5 mg daily x 2 days, then 7.5 mg, then 10 mg. A bit hesitant to continue with 10 mg, but will give one more day.   Platelet count low stable. No bleeding noted.      Family asked if she could stop daily iron, which was begun for anemia back in December, (when Hgb ~8) and was planned for just a few months.    Goal of Therapy:  INR 2-3 Heparin level 0.3-0.7 units/ml Monitor platelets by anticoagulation protocol: Yes   Plan:   Increase heparin drip to 1100 units/hr.  Repeat Coumadin 10 mg today.  Daily heparin level, CBC and PT/INR.  Continue heparin until INR >2 for 24 hours.  Bmet in am.    Stop daily iron, per family request?  Dennie Fetters, [place] Pager: [phone] 06/17/2012,9:05 AM

## 2012-06-17 NOTE — Progress Notes (Signed)
  Echocardiogram 2D Echocardiogram has been performed.  Georgian Co 06/17/2012, 5:36 PM

## 2012-06-17 NOTE — Care Management Note (Signed)
    Page 1 of 2   06/19/2012     4:14:39 PM   CARE MANAGEMENT NOTE 06/19/2012  Patient:  [name], [name]   Account Number:  [id number]  Date Initiated:  06/17/2012  Documentation initiated by:  Alaiyah Bollman  Subjective/Objective Assessment:   PT ADM ON 06/13/12 WITH DVT.  PTA, PT IS MOSTLY  BED BOUND, CARED FOR BY FAMILY MEMBERS AT HOME 24/7.     Action/Plan:   WILL FOLLOW FOR HOME NEEDS AS PT PROGRESSES.   Anticipated DC Date:  06/19/2012   Anticipated DC Plan:  HOME W HOME HEALTH SERVICES      DC Planning Services  CM consult      [facility] Choice  HOME HEALTH   Choice offered to / List presented to:  C-1 Patient        HH arranged  HH-1 RN  HH-2 PT  HH-3 OT  HH-4 NURSE'S AIDE      HH agency  [facility]   Status of service:  Completed, signed off Medicare Important Message given?   (If response is "NO", the following Medicare IM given date fields will be blank) Date Medicare IM given:   Date Additional Medicare IM given:    Discharge Disposition:  HOME W HOME HEALTH SERVICES  Per UR Regulation:  Reviewed for med. necessity/level of care/duration of stay  If discussed at Long Length of Stay Meetings, dates discussed:    Comments:  06/19/12 Raynelle Fanning Amberlin Utke,RN,BSN [phone] [facility] NOTIFIED OF DC HOME TODAY.  06/18/12 Taten Merrow,RN,BSN [phone] MET WITH PT AND 2 DAUGHTERS TO DISCUSS DC PLANS.  PTA, PT IS CARED FOR BY HER DAUGHTERS AND HH CARE PROVIDED BY [facility]. PT HAS RN, PT, OT AND A HH AIDE.  FAMILY WISHES TO CONTINUE THESE SERVICES AT DC.  THE ONLY DME THEY ARE INTERESTED IN IS A TUB TRANSFER BENCH.  IT IS NOT COVERED BY INSURANCE, AND FAMILY STATES THEY DO NOT WANT IT IF IT IS NOT COVERED. WILL ALERT [facility] OF LIKELY DC TOMORROW.  PT HAS HOSPITAL BED, AND ALL OTHER NEEDED EQUIPMENT AT HOME.

## 2012-06-17 NOTE — Progress Notes (Signed)
[facility] PROGRESS NOTE  [name] ZOX:[id number] DOB: 1920/03/27 DOA: 06/13/2012 PCP: Willey Blade, MD  Assessment/Plan: 1. Acute LLE and bilateral UE DVT/ bilateral PE:  - admitted to tele bed and started heron IV Heparin and coumadin dosing asper pharmacy.  - provocative factorin the UE DVT appears to be the PICC line, could not explain the LLE DVT.  - pt and family understand the risk of Bleeding being on anticoagulation.  -5 TH DAY of heparin and coumadin. iNR subtherapeutic.   2. Hypothyroidism: resume home dose of synthroid.   3. GERD: RESUME protonix.   4. Hypertension: controlled. Resume home medications.   5. DM: her last hgba1c is 6.9. On  SSI.  CBG (last 3)   Recent Labs  06/17/12 0614 06/17/12 1120 06/17/12 1643  GLUCAP 128* 177* 147*      6. CAD; she is on aspirin and plavix , plavix was held for anemia . She never underwent colonoscopy because of her age.   7. Chest pain, both right and left sided, atypical in nature, but in view of her DVT's CT ANGIO was obtained and was positive for PE,  ECHO ordered  And pending. 8. DVT PROPHYLAXIS: on therapeutic anticoagulation.    Code Status: full code Family Communication: daughter atbedside,  Disposition Plan: pending   Consultants:  none Procedures:  none  Antibiotics:  none  HPI/Subjective: Right sided chest pain improved on deep inspiration.    Objective: Filed Vitals:   06/16/12 1353 06/16/12 2035 06/17/12 0356 06/17/12 1446  BP: 136/65 106/58 124/55 99/51  Pulse: 76 69 70 69  Temp: 97 F (36.1 C) 98.4 F (36.9 C) 98.2 F (36.8 C) 98.4 F (36.9 C)  TempSrc: Oral Oral Oral Oral  Resp: 20 19 18 16   Height:      Weight:      SpO2: 97% 97% 95% 98%    Intake/Output Summary (Last 24 hours) at 06/17/12 1722 Last data filed at 06/17/12 1600  Gross per 24 hour  Intake 380.07 ml  Output      0 ml  Net 380.07 ml   Filed Weights   06/13/12 1040 06/13/12 2104  Weight:  88.905 kg (196 lb) 87.1 kg (192 lb 0.3 oz)    Exam: Cardiovascular: regular, S1-S2  Respiratory: normal respiratory effort, no crackles or wheezing  Abdomen: soft, positive bowel sounds  Musculoskeletal: no clubbing or cyanosis, distally perfused   Neurologic: strength and sensation are intact     Data Reviewed: Basic Metabolic Panel:  Recent Labs Lab 06/13/12 1245 06/14/12 0530  NA 138 137  K 4.4 4.3  CL 101 102  CO2 27 26  GLUCOSE 167* 172*  BUN 12 12  CREATININE 1.08 1.07  CALCIUM 9.9 9.2   Liver Function Tests:  Recent Labs Lab 06/13/12 1245 06/14/12 0530  AST 17 14  ALT 9 7  ALKPHOS 87 70  BILITOT 0.6 0.5  PROT 7.1 6.0  ALBUMIN 3.4* 2.8*   No results found for this basename: LIPASE, AMYLASE,  in the last 168 hours No results found for this basename: AMMONIA,  in the last 168 hours CBC:  Recent Labs Lab 06/13/12 1245 06/14/12 0530 06/15/12 0939 06/16/12 0340 06/17/12 0520  WBC 5.0 5.2 5.1 5.8 6.0  NEUTROABS 2.8  --   --   --   --   HGB 13.8 12.7 12.0 12.0 11.9*  HCT 43.1 38.3 36.5 36.1 36.8  MCV 90.7 88.9 89.5 89.4 89.8  PLT 147* 140* 126*  136* 137*   Cardiac Enzymes:  Recent Labs Lab 06/13/12 1245 06/15/12 1548  TROPONINI <0.30 <0.30   BNP (last 3 results)  Recent Labs  12/15/11 0650 12/17/11 1603 01/01/12 0435  PROBNP 1637.0* 2026.0* 2693.0*   CBG:  Recent Labs Lab 06/16/12 1603 06/16/12 2127 06/17/12 0614 06/17/12 1120 06/17/12 1643  GLUCAP 134* 155* 128* 177* 147*    No results found for this or any previous visit (from the past 240 hour(s)).   Studies: No results found.  Scheduled Meds: . aspirin  81 mg Oral Daily  . atorvastatin  10 mg Oral q1800  . feeding supplement  237 mL Oral Q24H  . folic acid  1 mg Oral Daily  . gabapentin  100 mg Oral BID  . insulin aspart  0-5 Units Subcutaneous QHS  . insulin aspart  0-9 Units Subcutaneous TID WC  . isosorbide mononitrate  15 mg Oral Daily  . levothyroxine  50  mcg Oral QAC breakfast  . loratadine  10 mg Oral Daily  . metoprolol tartrate  12.5 mg Oral BID  . pantoprazole  40 mg Oral BID AC  . potassium chloride SA  20 mEq Oral Daily  . sodium chloride  3 mL Intravenous Q12H  . torsemide  20 mg Oral QODAY  . Vitamin D (Ergocalciferol)  50,000 Units Oral Q7 days  . warfarin  10 mg Oral ONCE-1800  . Warfarin - Pharmacist Dosing Inpatient   Does not apply q1800   Continuous Infusions: . heparin 1,100 Units/hr (06/17/12 0856)    Principal Problem:   DVT (deep venous thrombosis) Active Problems:   Dyspepsia and other specified disorders of function of stomach   Diabetes mellitus   Coronary artery disease, CABG in 1994 with last cath 2008 patent grafts and normal EF   Hypertension   Pacemaker, medtronic Adapta 06/29/06  secondary to symptomatic bradycardia and 2nd degree heart block    Time spent: 30 minutes   [name]  [facility] Pager [phone] If 7PM-7AM, please contact night-coverage at [email or web address], password [facility] 06/17/2012, 5:22 PM  LOS: 4 days

## 2012-06-18 DIAGNOSIS — N179 Acute kidney failure, unspecified: Secondary | ICD-10-CM

## 2012-06-18 DIAGNOSIS — Z950 Presence of cardiac pacemaker: Secondary | ICD-10-CM

## 2012-06-18 DIAGNOSIS — N189 Chronic kidney disease, unspecified: Secondary | ICD-10-CM

## 2012-06-18 DIAGNOSIS — Z86711 Personal history of pulmonary embolism: Secondary | ICD-10-CM | POA: Diagnosis present

## 2012-06-18 DIAGNOSIS — I2699 Other pulmonary embolism without acute cor pulmonale: Secondary | ICD-10-CM

## 2012-06-18 DIAGNOSIS — I82629 Acute embolism and thrombosis of deep veins of unspecified upper extremity: Secondary | ICD-10-CM

## 2012-06-18 LAB — CBC
HCT: 37.2 % (ref 36.0–46.0)
Hemoglobin: 12.3 g/dL (ref 12.0–15.0)
MCH: 29.6 pg (ref 26.0–34.0)
MCHC: 33.1 g/dL (ref 30.0–36.0)
RBC: 4.16 MIL/uL (ref 3.87–5.11)

## 2012-06-18 LAB — BASIC METABOLIC PANEL
Chloride: 104 mEq/L (ref 96–112)
GFR calc Af Amer: 34 mL/min — ABNORMAL LOW (ref >90)
GFR calc non Af Amer: 29 mL/min — ABNORMAL LOW (ref >90)
Potassium: 4.8 mEq/L (ref 3.5–5.1)
Sodium: 140 mEq/L (ref 135–145)

## 2012-06-18 LAB — GLUCOSE, CAPILLARY: Glucose-Capillary: 171 mg/dL — ABNORMAL HIGH (ref 70–99)

## 2012-06-18 LAB — PROTIME-INR: Prothrombin Time: 23.4 seconds — ABNORMAL HIGH (ref 11.6–15.2)

## 2012-06-18 LAB — HEPARIN LEVEL (UNFRACTIONATED): Heparin Unfractionated: 0.53 IU/mL (ref 0.30–0.70)

## 2012-06-18 MED ORDER — WARFARIN SODIUM 5 MG PO TABS
5.0000 mg | ORAL_TABLET | Freq: Once | ORAL | Status: AC
  Administered 2012-06-18: 5 mg via ORAL
  Filled 2012-06-18: qty 1

## 2012-06-18 MED ORDER — HEPARIN (PORCINE) IN NACL 100-0.45 UNIT/ML-% IJ SOLN
1100.0000 [IU]/h | INTRAMUSCULAR | Status: DC
  Administered 2012-06-18: 1100 [IU]/h via INTRAVENOUS
  Filled 2012-06-18 (×3): qty 250

## 2012-06-18 MED ORDER — PATIENT'S GUIDE TO USING COUMADIN BOOK
Freq: Once | Status: AC
  Administered 2012-06-18: 17:00:00
  Filled 2012-06-18: qty 1

## 2012-06-18 NOTE — Progress Notes (Signed)
[facility] PROGRESS NOTE  [name] ZOX:[id number] DOB: 1920/11/02 DOA: 06/13/2012 PCP: Willey Blade, MD   Brief HPI: [name] is a 79 y.o. female who was referred to the emergency department by her primary care provider after outpatient upper extremity Dopplers demonstrated bilateral DVTs of the upper extremities. Apparently, patient had noticed upper extremity swelling and pain since a PICC line was placed in February 2014. Today, the patient denies any shortness of breath or chest pain. However, patient did complain of left lower extremity swelling. Lower extremity Dopplers were obtained which demonstrated evidence suggestive of a "possible hyper acute deep vein thrombosis" of the left saphenofemoral junction, common femoral, and proximal femoral vein. The hospital service was subsequently consult if her further recommendations and for possible admission. On arrival to ED, She was put on IV heparin and coumadin was started right away. She completed 5 days of IV heparin and her INR is therapeutic today. During her hospitalization, she complained of right sided chest pain, troponins were negative and a CT angio was obtained which showed bilateral PE, without any right heart strain. Echo was ordered and results were pending. Received a call from Dr Rennis Golden today that [name] is a patient of [facility] and would like to consult on her for anticoagulation as we started on IV heparin/ coumadin. Appreciate [facility] coming in to consult  on her.    Assessment/Plan: 1. Acute LLE and bilateral UE DVT/ bilateral PE:  - admitted to tele bed and started heron IV Heparin and coumadin dosing asper pharmacy.  - provocative factorin the UE DVT appears to be the PICC line, could not explain the LLE DVT.  - pt and family understand the risk of Bleeding being on anticoagulation.  - completed 5  DAYs of heparin and coumadin. iNR therapeutic. Will stop heparin today.   2. Hypothyroidism: resume home  dose of synthroid.   3. GERD: RESUME protonix.   4. Hypertension: controlled. Resume home medications.   5. DM: her last hgba1c is 6.9. On  SSI.  CBG (last 3)   Recent Labs  06/17/12 2220 06/18/12 0647 06/18/12 1137  GLUCAP 161* 145* 171*      6. CAD, s/p pacemaker ; she is on aspirin and plavix , plavix was held for anemia . She never underwent colonoscopy because of her age. She was admitted in 12/13 for fluid over load,but it was thought to be secondary to low protein state and discharged on demadex.   7. Chest pain, both right and left sided, atypical in nature, but in view of her DVT's CT ANGIO was obtained and was positive for PE,  ECHO ordered  And results pending.   8. Questionable h/o GI bleed; prompted to stop the plavix last year when her H&h dropped. But currently since she has blood clots in her arms, DVT and PE, she was restarted on anticoagulation, and family understands the risks of being on anticoagulation with h/o GI BLEED. She never underwent colonoscopy because of her age and her recent EGD showed a small non bleeding ulcer. Currently her H&h remained stable.   9. Acute on CKD stage 2; will hold the torsemide and check renal parameters in am or in 1 to 2 days after discharge.   8. DVT PROPHYLAXIS: on therapeutic anticoagulation.    Code Status: full code Family Communication: daughter atbedside,  Disposition Plan: pending, awaiting PT/OT EVAL.    Consultants:  CARDIOLOGY.  Procedures:  none  Antibiotics:  none  HPI/Subjective: Right  sided chest pain improved on deep inspiration.  No new complaints. Wants to go home.   Objective: Filed Vitals:   06/17/12 2100 06/18/12 0448 06/18/12 1054 06/18/12 1300  BP: 109/52 147/81 112/62 122/70  Pulse: 67 79 75 75  Temp: 98.3 F (36.8 C) 98.5 F (36.9 C)  97.8 F (36.6 C)  TempSrc: Oral Oral  Oral  Resp: 18 18  16   Height:      Weight:      SpO2: 96% 95%  93%    Intake/Output Summary (Last 24  hours) at 06/18/12 1509 Last data filed at 06/18/12 1055  Gross per 24 hour  Intake 597.07 ml  Output      0 ml  Net 597.07 ml   Filed Weights   06/13/12 1040 06/13/12 2104  Weight: 88.905 kg (196 lb) 87.1 kg (192 lb 0.3 oz)    Exam: Cardiovascular: regular, S1-S2  Respiratory: normal respiratory effort, no crackles or wheezing  Abdomen: soft, positive bowel sounds  Musculoskeletal: no clubbing or cyanosis, distally perfused   Neurologic: strength and sensation are intact     Data Reviewed: Basic Metabolic Panel:  Recent Labs Lab 06/13/12 1245 06/14/12 0530 06/18/12 0355  NA 138 137 140  K 4.4 4.3 4.8  CL 101 102 104  CO2 27 26 29   GLUCOSE 167* 172* 167*  BUN 12 12 22   CREATININE 1.08 1.07 1.52*  CALCIUM 9.9 9.2 9.6   Liver Function Tests:  Recent Labs Lab 06/13/12 1245 06/14/12 0530  AST 17 14  ALT 9 7  ALKPHOS 87 70  BILITOT 0.6 0.5  PROT 7.1 6.0  ALBUMIN 3.4* 2.8*   No results found for this basename: LIPASE, AMYLASE,  in the last 168 hours No results found for this basename: AMMONIA,  in the last 168 hours CBC:  Recent Labs Lab 06/13/12 1245 06/14/12 0530 06/15/12 0939 06/16/12 0340 06/17/12 0520 06/18/12 0355  WBC 5.0 5.2 5.1 5.8 6.0 5.4  NEUTROABS 2.8  --   --   --   --   --   HGB 13.8 12.7 12.0 12.0 11.9* 12.3  HCT 43.1 38.3 36.5 36.1 36.8 37.2  MCV 90.7 88.9 89.5 89.4 89.8 89.4  PLT 147* 140* 126* 136* 137* 138*   Cardiac Enzymes:  Recent Labs Lab 06/13/12 1245 06/15/12 1548  TROPONINI <0.30 <0.30   BNP (last 3 results)  Recent Labs  12/15/11 0650 12/17/11 1603 01/01/12 0435  PROBNP 1637.0* 2026.0* 2693.0*   CBG:  Recent Labs Lab 06/17/12 1120 06/17/12 1643 06/17/12 2220 06/18/12 0647 06/18/12 1137  GLUCAP 177* 147* 161* 145* 171*    No results found for this or any previous visit (from the past 240 hour(s)).   Studies: No results found.  Scheduled Meds: . aspirin  81 mg Oral Daily  . atorvastatin  10  mg Oral q1800  . feeding supplement  237 mL Oral Q24H  . folic acid  1 mg Oral Daily  . gabapentin  100 mg Oral BID  . insulin aspart  0-5 Units Subcutaneous QHS  . insulin aspart  0-9 Units Subcutaneous TID WC  . isosorbide mononitrate  15 mg Oral Daily  . levothyroxine  50 mcg Oral QAC breakfast  . loratadine  10 mg Oral Daily  . metoprolol tartrate  12.5 mg Oral BID  . patient's guide to using coumadin book   Does not apply Once  . sodium chloride  3 mL Intravenous Q12H  . torsemide  20 mg Oral  QODAY  . Vitamin D (Ergocalciferol)  50,000 Units Oral Q7 days  . Warfarin - Pharmacist Dosing Inpatient   Does not apply q1800   Continuous Infusions: . heparin 1,100 Units/hr (06/17/12 1940)    Principal Problem:   DVT (deep venous thrombosis) Active Problems:   Dyspepsia and other specified disorders of function of stomach   Diabetes mellitus   Coronary artery disease, CABG in 1994 with last cath 2008 patent grafts and normal EF   Hypertension   Pacemaker, medtronic Adapta 06/29/06  secondary to symptomatic bradycardia and 2nd degree heart block    Time spent: 30 minutes   Gladies Sofranko  [facility] Pager [phone] If 7PM-7AM, please contact night-coverage at [email or web address], password [facility] 06/18/2012, 3:09 PM  LOS: 5 days

## 2012-06-18 NOTE — Progress Notes (Signed)
PT Cancellation Note  Patient Details Name: [name] MRN: [id number] DOB: 05-27-1920   Cancelled Treatment:     Evaluation orders received, Per Case Managers Note, Patient is bed-bound at baseline with family care 24/7. No appropriate acute PT needs. No evaluation performed, PT will sign off.   Fabio Asa 06/18/2012, 1:42 PM Charlotte Crumb, PT DPT  [phone]

## 2012-06-18 NOTE — Progress Notes (Signed)
Clarification: Pt is not bedridden, uses RW and WC at home

## 2012-06-18 NOTE — Progress Notes (Signed)
ANTICOAGULATION CONSULT NOTE - Follow Up Consult  Pharmacy Consult for Heparin and Coumadin Indication: LLE and bilateral UE DVT, bilateral PE  Patient Measurements: Height: 5\' 4"  (162.6 cm) Weight: 192 lb 0.3 oz (87.1 kg) IBW/kg (Calculated) : 54.7 Heparin Dosing Weight: 67.6 kg  Vital Signs: Temp: 98.5 F (36.9 C) (06/03 0448) Temp src: Oral (06/03 0448) BP: 147/81 mmHg (06/03 0448) Pulse Rate: 79 (06/03 0448)  Labs:  Recent Labs  06/15/12 1548  06/16/12 0340 06/16/12 1220 06/17/12 0520 06/18/12 0355  HGB  --   < > 12.0  --  11.9* 12.3  HCT  --   --  36.1  --  36.8 37.2  PLT  --   --  136*  --  137* 138*  LABPROT  --   --  15.0  --  17.7* 23.4*  INR  --   --  1.20  --  1.50* 2.19*  HEPARINUNFRC  --   < > 0.74* 0.52 0.28* 0.53  CREATININE  --   --   --   --   --  1.52*  TROPONINI <0.30  --   --   --   --   --   < > = values in this interval not displayed.  Estimated Creatinine Clearance: 26.8 ml/min (by C-G formula based on Cr of 1.52).  Assessment:   Day # 6 overlap Heparin and Coumadin for bilateral PE, bilateral UE DVT and LLE DVT.   Heparin level is therapeutic today on 1100 units/hr.  INR is now in target range for the first days, after Coumadin 5 mg daily x 2 days, then 7.5 mg, then 10 mg x 2 days. Expect INR will rise more by am.   Platelet count low stable. No bleeding noted.   Expecting discharge later today. Discussed briefly with Dr. Blake Divine.  Scr up to 1.52, crcl ~25-30 ml./min.  Goal of Therapy:  INR 2-3 Heparin level 0.3-0.7 units/ml Monitor platelets by anticoagulation protocol: Yes   Plan:   Continue heparin drip to 1100 units/hr.  Coumadin 5 mg today. Would continue 5 mg daily as starting outpatient dose. F/u INR within the next few days.  Daily heparin level, CBC and PT/INR while in the hospital.      Continue heparin/Lovenox until INR >2 for 24 hours.  Suggest Lovenox 90 mg x 1 later today, prior to discharge.  Once daily Lovenox is appropriate  for current renal function.  Dose should be scheduled 1 hour after heparin drip is stopped.  Dennie Fetters, RPh Pager: [phone]) 06/18/2012,10:40 AM

## 2012-06-18 NOTE — Progress Notes (Signed)
OT Cancellation Note  Patient Details Name: [name] MRN: [id number] DOB: 06-14-1920   Cancelled Treatment:    Reason Eval/Treat Not Completed: Other (comment) Pt bed bound and total care. OT signing off.  [facility] Ravin Bendall, OTR/L  [phone] 06/18/2012 06/18/2012, 6:41 PM

## 2012-06-18 NOTE — Consult Note (Signed)
Reason for Consult: DVT/Coumadin MGT Referring Physician:   [name] is an 79 y.o. female.  HPI: The patient is an 79 year old female with history of hypertension, hyperlipidemia, coronary artery disease, chronic kidney disease, anemia, hypothyroidism, diastolic dysfunction, PAF sick sinus syndrome. She had pacemaker implanted in 2008 with Medtronic device. Since significant gastrointestinal bleed in December 2013. Pletal and Plavix stopped at that time.  Patient was sent to the emergency room after being seen in her primary care provider having upper extremity Dopplers which revealed bilateral DVTs.  Patient had 2-D echocardiogram on 06/17/2012 revealed an ejection fraction of 40-45% with incoordinate septal motion and inferior posterior akinesis. Grade 1 diastolic dysfunction. This is a change from prior echocardiogram December 2013 which revealed an ejection fraction of 55 to 65%.  No wall motion abnormality at that time but it could not be excluded. Patient currently denies chest pain, shortness of breath, orthopnea, nausea, vomiting fever.  Medications: Scheduled Meds: . aspirin  81 mg Oral Daily  . atorvastatin  10 mg Oral q1800  . feeding supplement  237 mL Oral Q24H  . folic acid  1 mg Oral Daily  . gabapentin  100 mg Oral BID  . insulin aspart  0-5 Units Subcutaneous QHS  . insulin aspart  0-9 Units Subcutaneous TID WC  . isosorbide mononitrate  15 mg Oral Daily  . levothyroxine  50 mcg Oral QAC breakfast  . loratadine  10 mg Oral Daily  . metoprolol tartrate  12.5 mg Oral BID  . patient's guide to using coumadin book   Does not apply Once  . sodium chloride  3 mL Intravenous Q12H  . Vitamin D (Ergocalciferol)  50,000 Units Oral Q7 days  . Warfarin - Pharmacist Dosing Inpatient   Does not apply q1800   Continuous Infusions:  PRN Meds:.acetaminophen, hydrALAZINE, morphine injection, nitroGLYCERIN, zolpidem   Past Medical History  Diagnosis Date  . Hypertension   .  Coronary artery disease   . Legally blind   . Chronic kidney disease   . Anemia   . Hypothyroid   . Gastric ulcer   . Hiatal hernia   . Dyspepsia   . UTI (lower urinary tract infection)   . H/O: GI bleed 12//13  . Diastolic dysfunction   . Pacemaker   . High cholesterol   . CHF (congestive heart failure)   . Anginal pain   . MI (myocardial infarction) ? 1994  . DVT of upper extremity (deep vein thrombosis) 06/13/2012    BUE  . Seasonal allergies   . Allergy to perfume     "strong perfumes only" (06/13/2012)  . Shortness of breath     "can happen at any time" (06/13/2012)  . Type II diabetes mellitus   . History of blood transfusion 2013    "not related to OR; BP had dropped" (06/13/2012)  . GERD (gastroesophageal reflux disease)   . Daily headache   . Arthritis     "all over" (06/13/2012)  . A-fib   . 2Nd degree atrioventricular block     Past Surgical History  Procedure Laterality Date  . Esophagogastroduodenoscopy  12/22/2011    Procedure: ESOPHAGOGASTRODUODENOSCOPY (EGD);  Surgeon: Iva Boop, MD;  Location: [facility];  Service: Endoscopy;  Laterality: N/A;  . Cardioversion  06/06/06    successful  . Insert / replace / remove pacemaker  06/29/2006    Medtronic adapta  . Cardiac catheterization  10/25/92  . Cardiac catheterization  11/18/03    w/grafts 100%CX  LAD 80 & 100%  . Cardiac catheterization  01/24/05    diffuse disease of native vessels  . Cardiac catheterization  06/06/06    severe native CAD  . Coronary artery bypass graft  10/27/92    LIMA to LAD,SVG to LAD second diagonal,obtuse maraginal of the CX and posterior descendingbranch of the RCA  . Cataract extraction w/ intraocular lens  implant, bilateral Bilateral   . Refractive surgery Bilateral     Family History  Problem Relation Age of Onset  . Breast cancer Daughter   . Diabetes Son   . Diabetes Daughter   . Heart disease Father   . Diabetes Father   . Colon polyps Daughter   . Heart attack  Brother   . Diabetes Brother   . Stroke Sister     Social History:  reports that she has never smoked. She has never used smokeless tobacco. She reports that she does not drink alcohol or use illicit drugs.  Allergies:  Allergies  Allergen Reactions  . Darvon   . Digoxin And Related Nausea Only  . Nitrostat (Nitroglycerin)   . Other     Tylenol #3  . Penicillins Other (See Comments)    Was told had allergy from childhood... Unknown reaction  . Percocet (Oxycodone-Acetaminophen)   . Percodan (Oxycodone-Aspirin)   . Vicodin (Hydrocodone-Acetaminophen)    Results for orders placed during the hospital encounter of 06/13/12 (from the past 48 hour(s))  GLUCOSE, CAPILLARY     Status: Abnormal   Collection Time    06/16/12  4:03 PM      Result Value Range   Glucose-Capillary 134 (*) 70 - 99 mg/dL   Comment 1 Notify RN     Comment 2 Documented in Chart    GLUCOSE, CAPILLARY     Status: Abnormal   Collection Time    06/16/12  9:27 PM      Result Value Range   Glucose-Capillary 155 (*) 70 - 99 mg/dL   Comment 1 Documented in Chart     Comment 2 Notify RN    CBC     Status: Abnormal   Collection Time    06/17/12  5:20 AM      Result Value Range   WBC 6.0  4.0 - 10.5 K/uL   RBC 4.10  3.87 - 5.11 MIL/uL   Hemoglobin 11.9 (*) 12.0 - 15.0 g/dL   HCT 16.1  09.6 - 04.5 %   MCV 89.8  78.0 - 100.0 fL   MCH 29.0  26.0 - 34.0 pg   MCHC 32.3  30.0 - 36.0 g/dL   RDW 40.9  81.1 - 91.4 %   Platelets 137 (*) 150 - 400 K/uL  HEPARIN LEVEL (UNFRACTIONATED)     Status: Abnormal   Collection Time    06/17/12  5:20 AM      Result Value Range   Heparin Unfractionated 0.28 (*) 0.30 - 0.70 IU/mL   Comment:            IF HEPARIN RESULTS ARE BELOW     EXPECTED VALUES, AND PATIENT     DOSAGE HAS BEEN CONFIRMED,     SUGGEST FOLLOW UP TESTING     OF ANTITHROMBIN III LEVELS.  PROTIME-INR     Status: Abnormal   Collection Time    06/17/12  5:20 AM      Result Value Range   Prothrombin Time 17.7  (*) 11.6 - 15.2 seconds   INR 1.50 (*) 0.00 - 1.49  GLUCOSE, CAPILLARY     Status: Abnormal   Collection Time    06/17/12  6:14 AM      Result Value Range   Glucose-Capillary 128 (*) 70 - 99 mg/dL   Comment 1 Documented in Chart     Comment 2 Notify RN    GLUCOSE, CAPILLARY     Status: Abnormal   Collection Time    06/17/12 11:20 AM      Result Value Range   Glucose-Capillary 177 (*) 70 - 99 mg/dL   Comment 1 Notify RN    GLUCOSE, CAPILLARY     Status: Abnormal   Collection Time    06/17/12  4:43 PM      Result Value Range   Glucose-Capillary 147 (*) 70 - 99 mg/dL  GLUCOSE, CAPILLARY     Status: Abnormal   Collection Time    06/17/12 10:20 PM      Result Value Range   Glucose-Capillary 161 (*) 70 - 99 mg/dL  CBC     Status: Abnormal   Collection Time    06/18/12  3:55 AM      Result Value Range   WBC 5.4  4.0 - 10.5 K/uL   RBC 4.16  3.87 - 5.11 MIL/uL   Hemoglobin 12.3  12.0 - 15.0 g/dL   HCT 16.1  09.6 - 04.5 %   MCV 89.4  78.0 - 100.0 fL   MCH 29.6  26.0 - 34.0 pg   MCHC 33.1  30.0 - 36.0 g/dL   RDW 40.9  81.1 - 91.4 %   Platelets 138 (*) 150 - 400 K/uL  HEPARIN LEVEL (UNFRACTIONATED)     Status: None   Collection Time    06/18/12  3:55 AM      Result Value Range   Heparin Unfractionated 0.53  0.30 - 0.70 IU/mL   Comment:            IF HEPARIN RESULTS ARE BELOW     EXPECTED VALUES, AND PATIENT     DOSAGE HAS BEEN CONFIRMED,     SUGGEST FOLLOW UP TESTING     OF ANTITHROMBIN III LEVELS.  PROTIME-INR     Status: Abnormal   Collection Time    06/18/12  3:55 AM      Result Value Range   Prothrombin Time 23.4 (*) 11.6 - 15.2 seconds   INR 2.19 (*) 0.00 - 1.49  BASIC METABOLIC PANEL     Status: Abnormal   Collection Time    06/18/12  3:55 AM      Result Value Range   Sodium 140  135 - 145 mEq/L   Potassium 4.8  3.5 - 5.1 mEq/L   Chloride 104  96 - 112 mEq/L   CO2 29  19 - 32 mEq/L   Glucose, Bld 167 (*) 70 - 99 mg/dL   BUN 22  6 - 23 mg/dL   Creatinine, Ser  7.82 (*) 0.50 - 1.10 mg/dL   Calcium 9.6  8.4 - 95.6 mg/dL   GFR calc non Af Amer 29 (*) >90 mL/min   GFR calc Af Amer 34 (*) >90 mL/min   Comment:            The eGFR has been calculated     using the CKD EPI equation.     This calculation has not been     validated in all clinical     situations.     eGFR's persistently     <90 mL/min  signify     possible Chronic Kidney Disease.  GLUCOSE, CAPILLARY     Status: Abnormal   Collection Time    06/18/12  6:47 AM      Result Value Range   Glucose-Capillary 145 (*) 70 - 99 mg/dL  GLUCOSE, CAPILLARY     Status: Abnormal   Collection Time    06/18/12 11:37 AM      Result Value Range   Glucose-Capillary 171 (*) 70 - 99 mg/dL   Comment 1 Notify RN    CREATININE, SERUM     Status: Abnormal   Collection Time    06/18/12  2:18 PM      Result Value Range   Creatinine, Ser 1.59 (*) 0.50 - 1.10 mg/dL   GFR calc non Af Amer 28 (*) >90 mL/min   GFR calc Af Amer 32 (*) >90 mL/min   Comment:            The eGFR has been calculated     using the CKD EPI equation.     This calculation has not been     validated in all clinical     situations.     eGFR's persistently     <90 mL/min signify     possible Chronic Kidney Disease.    No results found.  Review of Systems  Constitutional: Negative for fever and diaphoresis.  Respiratory: Negative for cough and shortness of breath.   Cardiovascular: Positive for leg swelling. Negative for chest pain and orthopnea.  Gastrointestinal: Negative for nausea, vomiting, abdominal pain and blood in stool.  Genitourinary: Negative for hematuria.  Neurological: Negative for dizziness.  All other systems reviewed and are negative.   Blood pressure 122/70, pulse 75, temperature 97.8 F (36.6 C), temperature source Oral, resp. rate 16, height 5\' 4"  (1.626 m), weight 192 lb 0.3 oz (87.1 kg), SpO2 93.00%. Physical Exam  Constitutional: She is oriented to person, place, and time. She appears  well-developed. No distress.  Obese  HENT:  Head: Normocephalic and atraumatic.  Eyes: EOM are normal. Pupils are equal, round, and reactive to light. No scleral icterus.  Neck: Normal range of motion. No JVD present.  Cardiovascular: Normal rate, regular rhythm, S1 normal and S2 normal.   No murmur heard. Pulses:      Radial pulses are 2+ on the right side, and 2+ on the left side.       Dorsalis pedis pulses are 1+ on the right side, and 1+ on the left side.  Respiratory: Effort normal and breath sounds normal. She has no wheezes. She has no rales.  GI: Soft. Bowel sounds are normal.  Musculoskeletal: She exhibits edema.  1+ lower extremity edema  Neurological: She is alert and oriented to person, place, and time.  Skin: Skin is warm and dry.  Psychiatric: She has a normal mood and affect.    Assessment/Plan: Principal Problem:   DVT (deep venous thrombosis) Active Problems:   Dyspepsia and other specified disorders of function of stomach   Diabetes mellitus   Coronary artery disease, CABG in 1994 with last cath 2008 patent grafts and normal EF   Hypertension   Acute on chronic renal failure   Pacemaker, medtronic Adapta 06/29/06  secondary to symptomatic bradycardia and 2nd degree heart block   Acute pulmonary embolism  Plan:  INR currently therapeutic. Ok for discharge Patient will be scheduled for INR check and followup with the pharmacist at [facility] as well as with Dr.  Croitoru.  HAGER, BRYAN 06/18/2012, 3:22 PM      Agree with note written by [facility]  Pt admitted for BLE DVT/UE DVT and Bilat PE. Other history as outlined. Pt well known to our service. Denies CP/SOB. Exam benign. Has has 5 days of iv hep and coumadin AC. INR therapeutic.She has newly diagnosed decrease in LVEF (40-45% with inf WMA new since prior 2D). Given overall frailty and debility I would not further pursue this. OK to D/C home . ROV with Dr. Royann Shivers. Will need  our Coumadin Clinic to manage her coumadin  dosing as an OP with HHC.   Runell Gess 06/18/2012 4:12 PM

## 2012-06-19 DIAGNOSIS — D638 Anemia in other chronic diseases classified elsewhere: Secondary | ICD-10-CM

## 2012-06-19 LAB — PROTIME-INR
INR: 3.23 — ABNORMAL HIGH (ref 0.00–1.49)
Prothrombin Time: 31.2 seconds — ABNORMAL HIGH (ref 11.6–15.2)

## 2012-06-19 MED ORDER — WARFARIN SODIUM 5 MG PO TABS: 5.0000 mg | ORAL_TABLET | Freq: Every day | ORAL | Status: DC

## 2012-06-19 NOTE — Discharge Summary (Signed)
Physician Discharge Summary  Patient ID: [name] MRN: [id number] DOB/AGE: December 29, 1920 79 y.o.  Admit date: 06/13/2012 Discharge date: 06/19/2012  Primary Care Physician:  Willey Blade, MD  Discharge Diagnoses:    . Hypertension . Coronary artery disease, CABG in 1994 with last cath 2008 patent grafts and normal EF . Dyspepsia and other specified disorders of function of stomach . Pacemaker, medtronic Adapta 06/29/06  secondary to symptomatic bradycardia and 2nd degree heart block . Diabetes mellitus . Acute pulmonary embolism . Acute on chronic renal failure  Consults: [facility] cardiology   Recommendations for Outpatient Follow-up:   Patient was recommended to start Coumadin tomorrow, INR 3.23 today   Allergies:   Allergies  Allergen Reactions  . Darvon   . Digoxin And Related Nausea Only  . Nitrostat (Nitroglycerin)   . Other     Tylenol #3  . Penicillins Other (See Comments)    Was told had allergy from childhood... Unknown reaction  . Percocet (Oxycodone-Acetaminophen)   . Percodan (Oxycodone-Aspirin)   . Vicodin (Hydrocodone-Acetaminophen)      Discharge Medications:   Medication List    STOP taking these medications       potassium chloride SA 20 MEQ tablet  Commonly known as:  K-DUR,KLOR-CON     torsemide 20 MG tablet  Commonly known as:  DEMADEX      TAKE these medications       aspirin 81 MG chewable tablet  Chew 81 mg by mouth daily.     feeding supplement Liqd  Take 237 mLs by mouth daily.     ferrous sulfate 325 (65 FE) MG tablet  Take 325 mg by mouth daily with breakfast.     folic acid 1 MG tablet  Commonly known as:  FOLVITE  Take 1 mg by mouth daily.     gabapentin 100 MG capsule  Commonly known as:  NEURONTIN  Take 100 mg by mouth 2 (two) times daily.     isosorbide mononitrate 30 MG 24 hr tablet  Commonly known as:  IMDUR  Take 15 mg by mouth daily.     levothyroxine 50 MCG tablet  Commonly known as:  SYNTHROID,  LEVOTHROID  Take 50 mcg by mouth daily.     loratadine 10 MG tablet  Commonly known as:  CLARITIN  Take 10 mg by mouth daily. For allergies     metoprolol tartrate 25 MG tablet  Commonly known as:  LOPRESSOR  Take 12.5 mg by mouth 2 (two) times daily.     nitroGLYCERIN 0.2 mg/hr  Commonly known as:  NITRODUR - Dosed in mg/24 hr  Place 1 patch (0.2 mg total) onto the skin daily as needed (chest pain).     pantoprazole 40 MG tablet  Commonly known as:  PROTONIX  Take 1 tablet (40 mg total) by mouth 2 (two) times daily before a meal.     rosuvastatin 10 MG tablet  Commonly known as:  CRESTOR  Take 10 mg by mouth daily.     Vitamin D (Ergocalciferol) 50000 UNITS Caps  Commonly known as:  DRISDOL  Take 50,000 Units by mouth every 7 (seven) days. Mondays     warfarin 5 MG tablet  Commonly known as:  COUMADIN  Take 1 tablet (5 mg total) by mouth daily. Start on 06/20/12     zolpidem 10 MG tablet  Commonly known as:  AMBIEN  Take 5 mg by mouth at bedtime as needed. sleep  Brief H and P: For complete details please refer to admission H and P, but in brief [name] is a 79 y.o. female who was referred to the emergency department by her primary care provider after outpatient upper extremity Dopplers demonstrated bilateral DVTs of the upper extremities. Apparently, patient had noticed upper extremity swelling and pain since a PICC line was placed in February 2014. On the day of admission patient had denied any shortness of breath or chest pain however did complain of left lower extremity swelling. Lower extremity Dopplers were obtained which demonstrated evidence suggestive of a "possible hyper acute deep vein thrombosis" of the left saphenofemoral junction, common femoral, and proximal femoral vein.  Hospital Course:   1. Acute LLE and bilateral UE DVT/ bilateral PE: Patient was  started heron IV Heparin and coumadin dosing asper pharmacy. Provocative factor in the UE  DVT appears to be the PICC line, could not explain the LLE DVT. Pt and family understand the risk of Bleeding being on anticoagulation.  Patient completed 5 days of heparin and coumadin.  INR at the discharge is  3.23.  Patient was recommended to start Coumadin outpatient tomorrow on 06/20/2012. She has appointment with Coumadin clinic at [facility] cardiology on 06/21/2012. 2. Hypothyroidism:  Continued home dose of synthroid.  3. GERD:  Continued  protonix.  4. Hypertension: controlled. Resume home medications.  5. DM: her last hgba1c is 6.9. On SSI.     Day of Discharge BP 131/62  Pulse 70  Temp(Src) 98.3 F (36.8 C) (Oral)  Resp 17  Ht 5\' 4"  (1.626 m)  Wt 88.2 kg (194 lb 7.1 oz)  BMI 33.36 kg/m2  SpO2 93%  Physical Exam: General: Alert and awake oriented x3 not in any acute distress. CVS: S1-S2 clear no murmur rubs or gallops Chest: clear to auscultation bilaterally, no wheezing rales or rhonchi Abdomen: soft nontender, nondistended, normal bowel sounds, no organomegaly Extremities: no cyanosis, clubbing or edema noted bilaterally    The results of significant diagnostics from this hospitalization (including imaging, microbiology, ancillary and laboratory) are listed below for reference.    LAB RESULTS: Basic Metabolic Panel:  Recent Labs Lab 06/14/12 0530 06/18/12 0355 06/18/12 1418  NA 137 140  --   K 4.3 4.8  --   CL 102 104  --   CO2 26 29  --   GLUCOSE 172* 167*  --   BUN 12 22  --   CREATININE 1.07 1.52* 1.59*  CALCIUM 9.2 9.6  --    Liver Function Tests:  Recent Labs Lab 06/13/12 1245 06/14/12 0530  AST 17 14  ALT 9 7  ALKPHOS 87 70  BILITOT 0.6 0.5  PROT 7.1 6.0  ALBUMIN 3.4* 2.8*   No results found for this basename: LIPASE, AMYLASE,  in the last 168 hours No results found for this basename: AMMONIA,  in the last 168 hours CBC:  Recent Labs Lab 06/13/12 1245  06/17/12 0520 06/18/12 0355  WBC 5.0  < > 6.0 5.4  NEUTROABS 2.8  --   --    --   HGB 13.8  < > 11.9* 12.3  HCT 43.1  < > 36.8 37.2  MCV 90.7  < > 89.8 89.4  PLT 147*  < > 137* 138*  < > = values in this interval not displayed. Cardiac Enzymes:  Recent Labs Lab 06/13/12 1245 06/15/12 1548  TROPONINI <0.30 <0.30   BNP: No components found with this basename: POCBNP,  CBG:  Recent  Labs Lab 06/18/12 2133 06/19/12 0713  GLUCAP 156* 133*    Significant Diagnostic Studies:  No results found.  2D ECHO:   Disposition and Follow-up:     Discharge Orders   Future Appointments Provider Department Dept Phone   06/21/2012 10:40 AM Phillips Hay, RPH-CPP [facility] [place] [phone]   07/03/2012 11:45 AM Thurmon Fair, MD [facility] [place] [phone]   Future Orders Complete By Expires     Diet Carb Modified  As directed     Discharge instructions  As directed     Comments:      Please hold coumadin tonight. Start tomorrow on 06/20/12    Increase activity slowly  As directed         DISPOSITION:  Home DIET: carb modified ACTIVITY: as tolerated TESTS THAT NEED FOLLOW-UP  PT/INR  DISCHARGE FOLLOW-UP Follow-up Information   Follow up with Phillips Hay, RPH-CPP On 06/21/2012. (10:40AM)    Contact information:   [address] [place] [address] [phone]       Follow up with Thurmon Fair, MD On 07/03/2012. (11:45AM)    Contact information:   [address] [place] [address] [phone]       Follow up with August Saucer, [name], MD. Schedule an appointment as soon as possible for a visit in 2 weeks.   Contact information:   [facility] [address] [place] [address] [phone]       Time spent on Discharge:  35 minutes  Signed:   RAI,RIPUDEEP M.D. [facility] 06/19/2012, 11:15 AM Pager: [phone]

## 2012-06-19 NOTE — Evaluation (Signed)
Physical Therapy Evaluation Patient Details Name: [name] MRN: [id number] DOB: Nov 12, 1920 Today's Date: 06/19/2012 Time: 0815-0850 PT Time Calculation (min): 35 min  PT Assessment / Plan / Recommendation Clinical Impression  Pt is an extremely pleasant 79 yo female with multiple DVTs. Per pt and son report, she is not bedbound but she spends the majority of her time in bed. Pt lives with daughter and has 24/7 supervision and assistance as needed throughout the day. Pt requires superivison for bed mobility and mod assist for transfers. Pt did not ambulate with PT today due to dizziness but has been ambulating with HHPT PTA.  Anticipate pt safe to d/c as required help for bed mobility, transfers and ADLs is available at home and pt will likely move better in a familiar environment with limited eyesight. Pt with good understanding of transfer techniques from working with HHPT. Recommend continued HHPT on return home to continue to improve ambulation, transfer independence and safety with OOB mobility.      PT Assessment  Patient needs continued PT services    Follow Up Recommendations  Home health PT;Supervision/Assistance - 24 hour    Does the patient have the potential to tolerate intense rehabilitation      Barriers to Discharge None (Pt has adequate caregiver support at home)      Equipment Recommendations       Recommendations for Other Services     Frequency Min 3X/week    Precautions / Restrictions Precautions Precautions: Fall Restrictions Weight Bearing Restrictions: No   Pertinent Vitals/Pain Pt reports pain in many locations congruent with baseline       Mobility  Bed Mobility Bed Mobility: Rolling Left;Left Sidelying to Sit;Sitting - Scoot to [place] of Bed Rolling Left: 5: Supervision;5: Set up (v/c's for hand placement and increased time) Left Sidelying to Sit: 5: Supervision (increased time, and v/c's for hand placement) Sitting - Scoot to Edge of Bed:  6: Modified independent (Device/Increase time) (increased time) Details for Bed Mobility Assistance: Pt able to move in bed when all pillows are removed, hand is brought to rail and when she is told there are no obstacles Transfers Transfers: Editor, commissioning Transfers: 3: Mod assist;2: Max assist Details for Transfer Assistance: Pt able to sit to stand with max assist to lift trunk and foot blocked. Pt requires mod assist to stand without a RW but min assist to pivot with a RW. Pt required max v/c's for chair location and foot placement due to limited eyesight and mod assist to stand to sit safely with v/cs for sequencing.  (Pt reporting dizziness so stand pivot instead of ambulation)    Exercises     PT Diagnosis: Difficulty walking;Generalized weakness  PT Problem List: Decreased strength;Decreased range of motion;Decreased activity tolerance;Decreased balance;Decreased mobility;Decreased coordination;Decreased safety awareness PT Treatment Interventions: DME instruction;Gait training;Functional mobility training;Therapeutic activities;Therapeutic exercise;Balance training;Neuromuscular re-education   PT Goals Acute Rehab PT Goals PT Goal Formulation: With patient Time For Goal Achievement: 07/03/12 Potential to Achieve Goals: Good Pt will go Supine/Side to Sit: with HOB 0 degrees;with rail;Independently PT Goal: Supine/Side to Sit - Progress: Goal set today Pt will go Sit to Stand: with min assist;with upper extremity assist PT Goal: Sit to Stand - Progress: Goal set today Pt will go Stand to Sit: with min assist;with upper extremity assist PT Goal: Stand to Sit - Progress: Goal set today Pt will Transfer Bed to Chair/Chair to Bed: with min assist PT Transfer Goal: Bed to Chair/Chair to  Bed - Progress: Goal set today Pt will Ambulate: 16 - 50 feet;with min assist;with least restrictive assistive device PT Goal: Ambulate - Progress: Goal set today  Visit Information   Last PT Received On: 06/19/12 Assistance Needed: +1    Subjective Data  Subjective: PT recieved in bed, agreeable to PT. Pt reports she is legally blind Patient Stated Goal: To go home   Prior Functioning  Home Living Lives With: Family Available Help at Discharge: Family;Available 24 hours/day Type of Home: House Home Access: Ramped entrance Home Layout: One level Bathroom Shower/Tub: Engineer, manufacturing systems: Standard Bathroom Accessibility: No Home Adaptive Equipment: Hospital bed;Walker - rolling;Wheelchair - manual Additional Comments: Pt's w/c can move throughout the house Prior Function Level of Independence: Needs assistance Needs Assistance: Bathing;Dressing;Meal Prep;Light Housekeeping;Transfers Bath: Maximal (sponge baths) Dressing: Moderate (daughter lays out clothes, pt initiates dressing, dtr finish) Toileting: Moderate (transfer ) Meal Prep: Total Light Housekeeping: Total Transfer Assistance: Pt transfers with one person, mod assist to chair Able to Take Stairs?: No Driving: No Vocation: Retired Comments: Pt's daughter assists her with transfers and spongebaths on the bed. Pt's daughter pulls pants up and down but pt is able to put the clothes partially on with the help of dressing aids. Pt is working wiht HHPT at home and walking 20 ft before resting. Communication Communication: HOH    Cognition  Cognition Arousal/Alertness: Awake/alert Behavior During Therapy: WFL for tasks assessed/performed Overall Cognitive Status: Within Functional Limits for tasks assessed    Extremity/Trunk Assessment Right Upper Extremity Assessment RUE ROM/Strength/Tone: Deficits RUE ROM/Strength/Tone Deficits: Pt able to elevate arm to shoulder height, generalized weakness Left Upper Extremity Assessment LUE ROM/Strength/Tone: Deficits LUE ROM/Strength/Tone Deficits: Pt able to lift arm to shoulder height against gravity, generalized weakness Right Lower Extremity  Assessment RLE ROM/Strength/Tone: Deficits RLE ROM/Strength/Tone Deficits: generalized weakness Left Lower Extremity Assessment LLE ROM/Strength/Tone: Deficits LLE ROM/Strength/Tone Deficits: generalized weakness Trunk Assessment Trunk Assessment: Kyphotic   Balance Balance Balance Assessed: Yes Static Sitting Balance Static Sitting - Balance Support: Bilateral upper extremity supported;Feet unsupported Static Sitting - Level of Assistance: 5: Stand by assistance Static Sitting - Comment/# of Minutes: 10  End of Session PT - End of Session Equipment Utilized During Treatment: Gait belt Activity Tolerance: Patient tolerated treatment well Patient left: in chair;with call bell/phone within reach;with nursing in room;with family/visitor present Nurse Communication: Mobility status (IV loose and bleeding)  GP     06/19/2012, 9:15 AM Marvis Moeller, Student Physical Therapist Office #: [phone]

## 2012-06-19 NOTE — Evaluation (Signed)
Physical Therapy Evaluation Patient Details Name: [name] MRN: [id number] DOB: [birth year] Today's Date: 06/19/2012 Time: 0815-0850 PT Time Calculation (min): 35 min  PT Assessment / Plan / Recommendation Clinical Impression  Pt is an extremely pleasant 79 yo female with multiple DVTs. Per pt and son report, she is not bedbound but she spends the majority of her time in bed. Pt lives with daughter and has 24/7 supervision and assistance as needed throughout the day. Pt requires superivison for bed mobility and mod assist for transfers. Pt did not ambulate with PT today due to dizziness but has been ambulating with HHPT PTA.  Anticipate pt safe to d/c as required help for bed mobility, transfers and ADLs is available at home and pt will likely move better in a familiar environment with limited eyesight. Pt with good understanding of transfer techniques from working with HHPT. Recommend continued HHPT on return home to continue to improve ambulation, transfer independence and safety with OOB mobility.      PT Assessment  Patient needs continued PT services    Follow Up Recommendations  Home health PT;Supervision/Assistance - 24 hour    Does the patient have the potential to tolerate intense rehabilitation      Barriers to Discharge None (Pt has adequate caregiver support at home)      Equipment Recommendations       Recommendations for Other Services     Frequency Min 3X/week    Precautions / Restrictions Precautions Precautions: Fall Restrictions Weight Bearing Restrictions: No   Pertinent Vitals/Pain Pt reports pain in many locations congruent with baseline       Mobility  Bed Mobility Bed Mobility: Rolling Left;Left Sidelying to Sit;Sitting - Scoot to [place] of Bed Rolling Left: 5: Supervision;5: Set up (v/c's for hand placement and increased time) Left Sidelying to Sit: 5: Supervision (increased time, and v/c's for hand placement) Sitting - Scoot to Edge of Bed:  6: Modified independent (Device/Increase time) (increased time) Details for Bed Mobility Assistance: Pt able to move in bed when all pillows are removed, hand is brought to rail and when she is told there are no obstacles Transfers Transfers: Editor, commissioning Transfers: 3: Mod assist;2: Max assist Details for Transfer Assistance: Pt able to sit to stand with max assist to lift trunk and foot blocked. Pt requires mod assist to stand without a RW but min assist to pivot with a RW. Pt required max v/c's for chair location and foot placement due to limited eyesight and mod assist to stand to sit safely with v/cs for sequencing.  (Pt reporting dizziness so stand pivot instead of ambulation)    Exercises     PT Diagnosis: Difficulty walking;Generalized weakness  PT Problem List: Decreased strength;Decreased range of motion;Decreased activity tolerance;Decreased balance;Decreased mobility;Decreased coordination;Decreased safety awareness PT Treatment Interventions: DME instruction;Gait training;Functional mobility training;Therapeutic activities;Therapeutic exercise;Balance training;Neuromuscular re-education   PT Goals Acute Rehab PT Goals PT Goal Formulation: With patient Time For Goal Achievement: 07/03/12 Potential to Achieve Goals: Good Pt will go Supine/Side to Sit: with HOB 0 degrees;with rail;Independently PT Goal: Supine/Side to Sit - Progress: Goal set today Pt will go Sit to Stand: with min assist;with upper extremity assist PT Goal: Sit to Stand - Progress: Goal set today Pt will go Stand to Sit: with min assist;with upper extremity assist PT Goal: Stand to Sit - Progress: Goal set today Pt will Transfer Bed to Chair/Chair to Bed: with min assist PT Transfer Goal: Bed to Chair/Chair to  Bed - Progress: Goal set today Pt will Ambulate: 16 - 50 feet;with min assist;with least restrictive assistive device PT Goal: Ambulate - Progress: Goal set today  Visit Information   Last PT Received On: 06/19/12 Assistance Needed: +1    Subjective Data  Subjective: PT recieved in bed, agreeable to PT. Pt reports she is legally blind Patient Stated Goal: To go home   Prior Functioning  Home Living Lives With: Family Available Help at Discharge: Family;Available 24 hours/day Type of Home: House Home Access: Ramped entrance Home Layout: One level Bathroom Shower/Tub: Engineer, manufacturing systems: Standard Bathroom Accessibility: No Home Adaptive Equipment: Hospital bed;Walker - rolling;Wheelchair - manual Additional Comments: Pt's w/c can move throughout the house Prior Function Level of Independence: Needs assistance Needs Assistance: Bathing;Dressing;Meal Prep;Light Housekeeping;Transfers Bath: Maximal (sponge baths) Dressing: Moderate (daughter lays out clothes, pt initiates dressing, dtr finish) Toileting: Moderate (transfer ) Meal Prep: Total Light Housekeeping: Total Transfer Assistance: Pt transfers with one person, mod assist to chair Able to Take Stairs?: No Driving: No Vocation: Retired Comments: Pt's daughter assists her with transfers and spongebaths on the bed. Pt's daughter pulls pants up and down but pt is able to put the clothes partially on with the help of dressing aids. Pt is working wiht HHPT at home and walking 20 ft before resting. Communication Communication: HOH    Cognition  Cognition Arousal/Alertness: Awake/alert Behavior During Therapy: WFL for tasks assessed/performed Overall Cognitive Status: Within Functional Limits for tasks assessed    Extremity/Trunk Assessment Right Upper Extremity Assessment RUE ROM/Strength/Tone: Deficits RUE ROM/Strength/Tone Deficits: Pt able to elevate arm to shoulder height, generalized weakness Left Upper Extremity Assessment LUE ROM/Strength/Tone: Deficits LUE ROM/Strength/Tone Deficits: Pt able to lift arm to shoulder height against gravity, generalized weakness Right Lower Extremity  Assessment RLE ROM/Strength/Tone: Deficits RLE ROM/Strength/Tone Deficits: generalized weakness Left Lower Extremity Assessment LLE ROM/Strength/Tone: Deficits LLE ROM/Strength/Tone Deficits: generalized weakness Trunk Assessment Trunk Assessment: Kyphotic   Balance Balance Balance Assessed: Yes Static Sitting Balance Static Sitting - Balance Support: Bilateral upper extremity supported;Feet unsupported Static Sitting - Level of Assistance: 5: Stand by assistance Static Sitting - Comment/# of Minutes: 10  End of Session PT - End of Session Equipment Utilized During Treatment: Gait belt Activity Tolerance: Patient tolerated treatment well Patient left: in chair;with call bell/phone within reach;with nursing in room;with family/visitor present Nurse Communication: Mobility status (IV loose and bleeding)  GP     06/19/2012, 9:15 AM Marvis Moeller, Student Physical Therapist Office #: [phone]   Agree with above assessment.  Lewis Shock, PT, DPT Pager #: [phone] Office #: [phone]

## 2012-06-20 ENCOUNTER — Ambulatory Visit (INDEPENDENT_AMBULATORY_CARE_PROVIDER_SITE_OTHER): Payer: Medicare Other | Admitting: Pharmacist Clinician (PhC)/ Clinical Pharmacy Specialist

## 2012-06-20 VITALS — BP 122/76 | HR 60

## 2012-06-20 DIAGNOSIS — I82409 Acute embolism and thrombosis of unspecified deep veins of unspecified lower extremity: Secondary | ICD-10-CM

## 2012-06-20 DIAGNOSIS — Z7901 Long term (current) use of anticoagulants: Secondary | ICD-10-CM

## 2012-06-20 NOTE — Progress Notes (Signed)
Pt grandson is requesting that we draw blood for trimethylamineoxide, C reactive protein and homocystein.  States these are "urgent indicators" of her health condition

## 2012-06-21 ENCOUNTER — Ambulatory Visit: Payer: Medicare Other | Admitting: Pharmacist Clinician (PhC)/ Clinical Pharmacy Specialist

## 2012-06-24 ENCOUNTER — Telehealth: Payer: Self-pay | Admitting: Cardiovascular Disease

## 2012-06-24 NOTE — Telephone Encounter (Signed)
Please go ahead with order for [facility] to do INR. Thanks.

## 2012-06-24 NOTE — Telephone Encounter (Signed)
Home Health Nurse need order so they can do her Coumadin at home-Verbal order will do!

## 2012-06-24 NOTE — Telephone Encounter (Signed)
Message forwarded to Dr. Royann Shivers to review and give order.  Will return call after a response from Dr. Royann Shivers.

## 2012-06-24 NOTE — Telephone Encounter (Signed)
Please go ahad w order for [facility] to do INR. Thanks

## 2012-06-25 NOTE — Telephone Encounter (Signed)
Returned call.  Left message to call back before 4pm.  

## 2012-06-26 NOTE — Telephone Encounter (Signed)
Returned call and informed per instructions by MD/PA.  Verbalized understanding and agreed w/ plan.  Stated [place], the nurse will call in to the coumadin clinic with the result.

## 2012-06-27 ENCOUNTER — Ambulatory Visit: Payer: Medicare Other | Admitting: Pharmacist Clinician (PhC)/ Clinical Pharmacy Specialist

## 2012-06-27 ENCOUNTER — Telehealth: Payer: Self-pay | Admitting: Cardiovascular Disease

## 2012-06-27 NOTE — Telephone Encounter (Signed)
He was suppose to draw her Coumadin today-pt called him and said she was 3eeing her primary doctor today and would have it there!

## 2012-06-27 NOTE — Telephone Encounter (Signed)
Call to pt and spoke w/ daughter, Alona Bene (ROI in paper chart).  Alona Bene stated pt has appt at 3:50pm today and will have level checked there.  RN gave fax number so result can be faxed.  Verbalized understanding.

## 2012-07-01 ENCOUNTER — Telehealth: Payer: Self-pay | Admitting: Cardiovascular Disease

## 2012-07-01 ENCOUNTER — Ambulatory Visit (INDEPENDENT_AMBULATORY_CARE_PROVIDER_SITE_OTHER): Payer: Self-pay | Admitting: Pharmacist Clinician (PhC)/ Clinical Pharmacy Specialist

## 2012-07-01 DIAGNOSIS — I82409 Acute embolism and thrombosis of unspecified deep veins of unspecified lower extremity: Secondary | ICD-10-CM

## 2012-07-01 DIAGNOSIS — Z7901 Long term (current) use of anticoagulants: Secondary | ICD-10-CM

## 2012-07-01 NOTE — Telephone Encounter (Signed)
No answer on home phone.  Pt has an appt 07/03/2012.

## 2012-07-01 NOTE — Telephone Encounter (Signed)
No answer on home phone.  Pt. Has an appt 07/03/12.

## 2012-07-01 NOTE — Telephone Encounter (Signed)
Forwarded to B. Lassiter, CMA.  

## 2012-07-01 NOTE — Telephone Encounter (Signed)
Wants to know if t you received the fax  on what lab she needed and if you did the test? Please call if you have any questions-they will give you an explanation!

## 2012-07-02 ENCOUNTER — Telehealth (HOSPITAL_COMMUNITY): Payer: Self-pay | Admitting: Cardiovascular Disease

## 2012-07-02 NOTE — Telephone Encounter (Signed)
Returned call and spoke w/ pt's daughter, Alona Bene.  Stated her nephew faxed some information of some tests he wants done for the pt on Friday and she wanted to know if they were received.  Daughter informed RN does not see fax in paper chart and advised they bring a copy in w/ pt tomorrow for her appt at 11:45am w/ Dr. Royann Shivers.  Daughter verbalized understanding and agreed w/ plan.

## 2012-07-02 NOTE — Telephone Encounter (Signed)
Did we receive the fax for Mrs Parlato to have lab work?

## 2012-07-03 ENCOUNTER — Other Ambulatory Visit: Payer: Self-pay | Admitting: Cardiovascular Disease

## 2012-07-03 ENCOUNTER — Encounter: Payer: Self-pay | Admitting: Cardiovascular Disease

## 2012-07-03 ENCOUNTER — Ambulatory Visit (INDEPENDENT_AMBULATORY_CARE_PROVIDER_SITE_OTHER): Payer: Medicare Other | Admitting: Cardiovascular Disease

## 2012-07-03 VITALS — BP 136/64 | HR 77 | Resp 20 | Ht 63.0 in | Wt 185.5 lb

## 2012-07-03 DIAGNOSIS — I5032 Chronic diastolic (congestive) heart failure: Secondary | ICD-10-CM

## 2012-07-03 DIAGNOSIS — I2510 Atherosclerotic heart disease of native coronary artery without angina pectoris: Secondary | ICD-10-CM

## 2012-07-03 DIAGNOSIS — I82409 Acute embolism and thrombosis of unspecified deep veins of unspecified lower extremity: Secondary | ICD-10-CM

## 2012-07-03 DIAGNOSIS — Z950 Presence of cardiac pacemaker: Secondary | ICD-10-CM

## 2012-07-03 DIAGNOSIS — I82403 Acute embolism and thrombosis of unspecified deep veins of lower extremity, bilateral: Secondary | ICD-10-CM

## 2012-07-03 LAB — PACEMAKER DEVICE OBSERVATION
AL IMPEDENCE PM: 450 Ohm
ATRIAL PACING PM: 9.9
BATTERY VOLTAGE: 2.75 V
RV LEAD THRESHOLD: 0.75 V
VENTRICULAR PACING PM: 98.9

## 2012-07-03 NOTE — Patient Instructions (Addendum)
Pacemaker check in 3 months.  Your physician recommends that you schedule a follow-up appointment in 6 months.

## 2012-07-03 NOTE — Progress Notes (Signed)
In clinic pacemaker interrogation. Normal device function. No changes made this session. 

## 2012-07-04 ENCOUNTER — Telehealth: Payer: Self-pay | Admitting: Cardiovascular Disease

## 2012-07-04 NOTE — Telephone Encounter (Signed)
[name] is calling because Dr.Croitoru requested that he call about the critical information that he gave DR.Croitoru  Thanks

## 2012-07-04 NOTE — Telephone Encounter (Signed)
Message forwarded to Dr. Royann Shivers.  Paper chart# [id number] on cart.

## 2012-07-07 ENCOUNTER — Encounter: Payer: Self-pay | Admitting: Cardiovascular Disease

## 2012-07-07 NOTE — Assessment & Plan Note (Signed)
Normal device function by recent remote care Link check; 98% ventricular pacing., Underlying complete heart block with an escape rhythm of around 40 beats per minute. Her device is recorded occasional episodes of paroxysmal atrial tachycardia and paroxysmal nature fibrillation generally less than 15 minutes in duration. She had not been on warfarin anticoagulation due to her repeated problems with anemia requiring transfusion. In fact December of last year her Plavix and Pletal also discontinued because of GI bleeding

## 2012-07-07 NOTE — Progress Notes (Signed)
Patient ID: [name], female   DOB: March 25, 1920, 79 y.o.   MRN: [id number]      Reason for office visit Hospital followup for venous thromboembolic disease  The patient had noticed upper extremity swelling and pain since a PICC line was placed in February 2014. Outpatient upper extremity Dopplers demonstrated bilateral DVTs of the upper extremities.  On the day of admission (5/29)the patient had denied any shortness of breath or chest pain, but did complain of left lower extremity swelling. Lower extremity Dopplers were obtained which demonstrated evidence suggestive of a "possible hyper acute deep vein thrombosis" of the left saphenofemoral junction, common femoral, and proximal femoral vein.  Today she feels well. The swelling in her limbs has diminished. She denies shortness of breath or pleuritic chest pain. She is to be enrolled in our Coumadin clinic today.  Unfortunately, she also has a history of recurrent moderate to severe microcytic anemia related to iron deficiency, presumably secondary to gastrointestinal blood loss. No overt bleeding since hospital discharge  Allergies  Allergen Reactions  . Darvon   . Digoxin And Related Nausea Only  . Nitrostat (Nitroglycerin)   . Other     Tylenol #3  . Penicillins Other (See Comments)    Was told had allergy from childhood... Unknown reaction  . Percocet (Oxycodone-Acetaminophen)   . Percodan (Oxycodone-Aspirin)   . Vicodin (Hydrocodone-Acetaminophen)     Current Outpatient Prescriptions  Medication Sig Dispense Refill  . aspirin 81 MG chewable tablet Chew 81 mg by mouth daily.      . feeding supplement (ENSURE COMPLETE) LIQD Take 237 mLs by mouth daily.      . ferrous sulfate 325 (65 FE) MG tablet Take 325 mg by mouth daily with breakfast.      . folic acid (FOLVITE) 1 MG tablet Take 1 mg by mouth daily.      Marland Kitchen gabapentin (NEURONTIN) 100 MG capsule Take 100 mg by mouth 2 (two) times daily.       . isosorbide mononitrate  (IMDUR) 30 MG 24 hr tablet Take 15 mg by mouth daily.      Marland Kitchen levothyroxine (SYNTHROID, LEVOTHROID) 50 MCG tablet Take 50 mcg by mouth daily.      Marland Kitchen loratadine (CLARITIN) 10 MG tablet Take 10 mg by mouth daily. For allergies      . metoprolol tartrate (LOPRESSOR) 25 MG tablet Take 12.5 mg by mouth 2 (two) times daily.      . nitroGLYCERIN (NITRODUR - DOSED IN MG/24 HR) 0.2 mg/hr Place 1 patch (0.2 mg total) onto the skin daily as needed (chest pain).  30 patch  0  . pantoprazole (PROTONIX) 40 MG tablet Take 1 tablet (40 mg total) by mouth 2 (two) times daily before a meal.  60 tablet  1  . rosuvastatin (CRESTOR) 10 MG tablet Take 10 mg by mouth daily.      . Vitamin D, Ergocalciferol, (DRISDOL) 50000 UNITS CAPS Take 50,000 Units by mouth every 7 (seven) days. Mondays      . warfarin (COUMADIN) 5 MG tablet Take 1 tablet (5 mg total) by mouth daily. Start on 06/20/12  15 tablet  0  . zolpidem (AMBIEN) 10 MG tablet Take 5 mg by mouth at bedtime as needed. sleep       No current facility-administered medications for this visit.    Past Medical History  Diagnosis Date  . Hypertension   . Coronary artery disease   . Legally blind   . Chronic kidney  disease   . Anemia   . Hypothyroid   . Gastric ulcer   . Hiatal hernia   . Dyspepsia   . UTI (lower urinary tract infection)   . H/O: GI bleed 12//13  . Diastolic dysfunction   . Pacemaker   . High cholesterol   . CHF (congestive heart failure)   . Anginal pain   . MI (myocardial infarction) ? 1994  . DVT of upper extremity (deep vein thrombosis) 06/13/2012    BUE  . Seasonal allergies   . Allergy to perfume     "strong perfumes only" (06/13/2012)  . Shortness of breath     "can happen at any time" (06/13/2012)  . Type II diabetes mellitus   . History of blood transfusion 2013    "not related to OR; BP had dropped" (06/13/2012)  . GERD (gastroesophageal reflux disease)   . Daily headache   . Arthritis     "all over" (06/13/2012)  . A-fib     . 2Nd degree atrioventricular block     Past Surgical History  Procedure Laterality Date  . Esophagogastroduodenoscopy  12/22/2011    Procedure: ESOPHAGOGASTRODUODENOSCOPY (EGD);  Surgeon: Iva Boop, MD;  Location: [facility];  Service: Endoscopy;  Laterality: N/A;  . Cardioversion  06/06/06    successful  . Insert / replace / remove pacemaker  06/29/2006    Medtronic adapta  . Cardiac catheterization  10/25/92  . Cardiac catheterization  11/18/03    w/grafts 100%CX LAD 80 & 100%  . Cardiac catheterization  01/24/05    diffuse disease of native vessels  . Cardiac catheterization  06/06/06    severe native CAD  . Coronary artery bypass graft  10/27/92    LIMA to LAD,SVG to LAD second diagonal,obtuse maraginal of the CX and posterior descendingbranch of the RCA  . Cataract extraction w/ intraocular lens  implant, bilateral Bilateral   . Refractive surgery Bilateral     Family History  Problem Relation Age of Onset  . Breast cancer Daughter   . Diabetes Son   . Diabetes Daughter   . Heart disease Father   . Diabetes Father   . Colon polyps Daughter   . Heart attack Brother   . Diabetes Brother   . Stroke Sister     History   Social History  . Marital Status: Married    Spouse Name: N/A    Number of Children: 10  . Years of Education: N/A   Occupational History  .     Social History Main Topics  . Smoking status: Never Smoker   . Smokeless tobacco: Never Used  . Alcohol Use: No  . Drug Use: No  . Sexually Active: No   Other Topics Concern  . Not on file   Social History Narrative  . No narrative on file    Review of systems:  Very limited mobility. Spendsin a chair or in bed. Today's in a wheelchair The patient specifically denies any chest pain at rest or with exertion, dyspnea at rest or with exertion, orthopnea, paroxysmal nocturnal dyspnea, syncope, palpitations, focal neurological deficits, intermittent claudication, lower extremity edema,  unexplained weight gain, cough, hemoptysis or wheezing.  The patient also denies abdominal pain, nausea, vomiting, dysphagia, diarrhea, constipation, polyuria, polydipsia, dysuria, hematuria, frequency, urgency, abnormal bleeding or bruising, fever, chills, unexpected weight changes, mood swings, change in skin or hair texture, change in voice quality, auditory or visual problems, allergic reactions or rashes, new musculoskeletal complaints other than usual "  aches and pains".   PHYSICAL EXAM BP 136/64  Pulse 77  Resp 20  Ht 5\' 3"  (1.6 m)  Wt 185 lb 8 oz (84.142 kg)  BMI 32.87 kg/m2 General appearance: alert, cooperative, no distress and mildly obese Neck: no adenopathy, no carotid bruit, supple, symmetrical, trachea midline, thyroid not enlarged, symmetric, no tenderness/mass/nodules and Cannot see the jugular venous pulsation Lungs: clear to auscultation bilaterally Heart: regular rate and rhythm, S1: normal and S2: paradoxically splitting Abdomen: No obvious abnormality, but exam is limited by severe abdominal obesity Extremities: edema No edema in the upper extremities, 1-2+ ankle edema and a symmetrical pattern bilateral Pulses: 2+ and symmetric Skin: Skin color, texture, turgor normal. No rashes or lesions Neurologic: Grossly normal   EKG: Sinus rhythm (atrial sensed),  ventricular paced rhythm; occasional PVCs  Lipid Panel     Component Value Date/Time   CHOL  Value: 148        ATP III CLASSIFICATION:  <200     mg/dL   Desirable  562-130  mg/dL   Borderline High  >=865    mg/dL   High 7/84/6962 9528   TRIG 103 08/10/2007 0400   HDL 37* 08/10/2007 0400   CHOLHDL 4.0 08/10/2007 0400   VLDL 21 08/10/2007 0400   LDLCALC  Value: 90        Total Cholesterol/HDL:CHD Risk Coronary Heart Disease Risk Table                     Men   Women  1/2 Average Risk   3.4   3.3 08/10/2007 0400    BMET    Component Value Date/Time   NA 140 06/18/2012 0355   K 4.8 06/18/2012 0355   CL 104 06/18/2012  0355   CO2 29 06/18/2012 0355   GLUCOSE 167* 06/18/2012 0355   BUN 22 06/18/2012 0355   CREATININE 1.59* 06/18/2012 1418   CALCIUM 9.6 06/18/2012 0355   GFRNONAA 28* 06/18/2012 1418   GFRAA 32* 06/18/2012 1418     ASSESSMENT AND PLAN Pacemaker, medtronic Adapta 06/29/06  secondary to symptomatic bradycardia and 2nd degree heart block Normal device function by recent remote care Link check; 98% ventricular pacing., Underlying complete heart block with an escape rhythm of around 40 beats per minute. Her device is recorded occasional episodes of paroxysmal atrial tachycardia and paroxysmal nature fibrillation generally less than 15 minutes in duration. She had not been on warfarin anticoagulation due to her repeated problems with anemia requiring transfusion. In fact December of last year her Plavix and Pletal also discontinued because of GI bleeding  Coronary artery disease, CABG in 1994 with last cath 2008 patent grafts and normal EF Really asymptomatic: Not a good candidate for percutaneous revascularization or surgical revascularization should these become necessary  DVT (deep venous thrombosis) The presentation with synchronous deep venous thrombosis in both upper sternum these and in one of the lower shunting these as well as acute pulmonary embolism raises the concern for a hypercoagulable state. Unfortunately she is oriented on warfarin and many of the studies we would like to perform a no longer possible. The PICC line may have caused the right upper extremity DVT and her pacemaker leads could be incriminated in the left upper shunting DVT and her sedentary immobilized lifestyle could explain the lower shunting DVT. However the fact that they all seem to occur at the same time does raise a concern. Neoplasm might be involved. There is no familial pattern of frequent DVT.  I would recommend keeping her INR between 2 and 2.5 to reduce the likelihood of GI bleeding. The total duration of anticoagulation  will depend on whether or not she has bleeding complications. If she doesn't do now continue his therapy lifelong the  [facility]  Thurmon Fair, MD, [facility] [phone] office [phone] pager

## 2012-07-07 NOTE — Assessment & Plan Note (Signed)
Really asymptomatic: Not a good candidate for percutaneous revascularization or surgical revascularization should these become necessary

## 2012-07-07 NOTE — Assessment & Plan Note (Addendum)
The presentation with synchronous deep venous thrombosis in both upper sternum these and in one of the lower shunting these as well as acute pulmonary embolism raises the concern for a hypercoagulable state. Unfortunately she is oriented on warfarin and many of the studies we would like to perform a no longer possible. The PICC line may have caused the right upper extremity DVT and her pacemaker leads could be incriminated in the left upper shunting DVT and her sedentary immobilized lifestyle could explain the lower shunting DVT. However the fact that they all seem to occur at the same time does raise a concern. Neoplasm might be involved. There is no familial pattern of frequent DVT.  I would recommend keeping her INR between 2 and 2.5 to reduce the likelihood of GI bleeding. The total duration of anticoagulation will depend on whether or not she has bleeding complications. If she doesn't do now continue his therapy lifelong the

## 2012-07-12 ENCOUNTER — Telehealth: Payer: Self-pay | Admitting: Cardiovascular Disease

## 2012-07-12 ENCOUNTER — Ambulatory Visit (INDEPENDENT_AMBULATORY_CARE_PROVIDER_SITE_OTHER): Payer: Self-pay | Admitting: Pharmacist Clinician (PhC)/ Clinical Pharmacy Specialist

## 2012-07-12 DIAGNOSIS — Z7901 Long term (current) use of anticoagulants: Secondary | ICD-10-CM

## 2012-07-12 DIAGNOSIS — I82409 Acute embolism and thrombosis of unspecified deep veins of unspecified lower extremity: Secondary | ICD-10-CM

## 2012-07-12 NOTE — Telephone Encounter (Signed)
Belenda Cruise, PharmD notified and advised to forward message.  Will review and advise.  Returned call and spoke w/ Alona Bene, pt's daughter.  Informed message received r/t INR and Belenda Cruise, PharmD notified and will f/u.  Alona Bene stated she will hold pt's coumadin until she hears from [place].

## 2012-07-12 NOTE — Telephone Encounter (Signed)
[name] daughter is calling because the home pt/inr levels are high and wants to know what is it that they need to do in reference to her medicine   Thanks

## 2012-07-12 NOTE — Telephone Encounter (Signed)
Scott from [facility] is calling [name] PT/INR Level.. Pt is 4.6 and this is their last visit to her .  Thanks

## 2012-07-18 ENCOUNTER — Ambulatory Visit (INDEPENDENT_AMBULATORY_CARE_PROVIDER_SITE_OTHER): Payer: Medicare Other | Admitting: Endocrinology

## 2012-07-18 ENCOUNTER — Encounter: Payer: Self-pay | Admitting: Endocrinology

## 2012-07-18 VITALS — BP 120/60 | HR 62 | Wt 193.6 lb

## 2012-07-18 DIAGNOSIS — E039 Hypothyroidism, unspecified: Secondary | ICD-10-CM

## 2012-07-18 DIAGNOSIS — E119 Type 2 diabetes mellitus without complications: Secondary | ICD-10-CM

## 2012-07-18 LAB — COMPREHENSIVE METABOLIC PANEL
ALT: 11 U/L (ref 0–35)
AST: 20 U/L (ref 0–37)
Albumin: 3.1 g/dL — ABNORMAL LOW (ref 3.5–5.2)
CO2: 24 mEq/L (ref 19–32)
Calcium: 9.1 mg/dL (ref 8.4–10.5)
Chloride: 108 mEq/L (ref 96–112)
GFR: 58.17 mL/min — ABNORMAL LOW (ref >60.00)
Potassium: 4.1 mEq/L (ref 3.5–5.1)
Sodium: 139 mEq/L (ref 135–145)
Total Protein: 6.4 g/dL (ref 6.0–8.3)

## 2012-07-18 LAB — TSH: TSH: 3.49 u[IU]/mL (ref 0.35–5.50)

## 2012-07-18 LAB — T4, FREE: Free T4: 0.73 ng/dL (ref 0.60–1.60)

## 2012-07-18 NOTE — Progress Notes (Signed)
Patient ID: [name], female   DOB: Nov 21, 1920, 79 y.o.   MRN: [id number] Reason for Appointment: Diabetes follow-up   History of Present Illness     Diagnosis: Type 2 diabetes mellitus, date of diagnosis: 1972.  Complications: peripheral neuropathy. having some pains in her feet on and off . Urine microalbumin normal previously Monitors blood glucose: once-twice daily.  Glucometer: Accucheck Aviva.  Blood Glucose readings: Glucometer not downloaded but readings reviewed: readings before breakfast: 90-112 and before supper  rarely> 200, 119-141; in March her readings were averaging 138 Hypoglycemia frequency: None recently. .  Meals: 2 meals at noon and 8 pm, appetite better, off Cokes, some Glucerna.  Physical activity: exercise:none.  Dietician visit: Most recent:2001.  She had been on insulin for several years and this had been tapered off in 2013 because of weight loss and lower blood sugars.. Blood sugars are still fairly good at home without any insulin although the glucose on her lab work was over 200.      Medication List       This list is accurate as of: 07/18/12 11:59 PM.  Always use your most recent med list.               ACCU-CHEK AVIVA PLUS test strip  Generic drug:  glucose blood  2 (two) times daily.     ACCU-CHEK FASTCLIX LANCETS Misc  2 (two) times daily.     aspirin 81 MG chewable tablet  Chew 81 mg by mouth daily.     feeding supplement Liqd  Take 237 mLs by mouth daily.     folic acid 1 MG tablet  Commonly known as:  FOLVITE  Take 1 mg by mouth daily.     gabapentin 100 MG capsule  Commonly known as:  NEURONTIN  Take 100 mg by mouth 2 (two) times daily.     isosorbide mononitrate 30 MG 24 hr tablet  Commonly known as:  IMDUR  Take 15 mg by mouth daily.     levothyroxine 50 MCG tablet  Commonly known as:  SYNTHROID, LEVOTHROID  Take 50 mcg by mouth daily.     loratadine 10 MG tablet  Commonly known as:  CLARITIN  Take 10 mg by  mouth daily. For allergies     metoprolol tartrate 25 MG tablet  Commonly known as:  LOPRESSOR  Take 12.5 mg by mouth 2 (two) times daily.     nitroGLYCERIN 0.2 mg/hr  Commonly known as:  NITRODUR - Dosed in mg/24 hr  Place 1 patch (0.2 mg total) onto the skin daily as needed (chest pain).     pantoprazole 40 MG tablet  Commonly known as:  PROTONIX  Take 1 tablet (40 mg total) by mouth 2 (two) times daily before a meal.     rosuvastatin 10 MG tablet  Commonly known as:  CRESTOR  Take 10 mg by mouth daily.     Vitamin D (Ergocalciferol) 50000 UNITS Caps  Commonly known as:  DRISDOL  Take 50,000 Units by mouth every 7 (seven) days. Mondays     warfarin 5 MG tablet  Commonly known as:  COUMADIN  Take 1 tablet (5 mg total) by mouth daily. Start on 06/20/12     zolpidem 10 MG tablet  Commonly known as:  AMBIEN  Take 5 mg by mouth at bedtime as needed. sleep        Past Medical History  Diagnosis Date  . Hypertension   . Coronary artery disease   .  Legally blind   . Chronic kidney disease   . Anemia   . Hypothyroid   . Gastric ulcer   . Hiatal hernia   . Dyspepsia   . UTI (lower urinary tract infection)   . H/O: GI bleed 12//13  . Diastolic dysfunction   . Pacemaker   . High cholesterol   . CHF (congestive heart failure)   . Anginal pain   . MI (myocardial infarction) ? 1994  . DVT of upper extremity (deep vein thrombosis) 06/13/2012    BUE  . Seasonal allergies   . Allergy to perfume     "strong perfumes only" (06/13/2012)  . Shortness of breath     "can happen at any time" (06/13/2012)  . Type II diabetes mellitus   . History of blood transfusion 2013    "not related to OR; BP had dropped" (06/13/2012)  . GERD (gastroesophageal reflux disease)   . Daily headache   . Arthritis     "all over" (06/13/2012)  . A-fib   . 2Nd degree atrioventricular block     Past Surgical History  Procedure Laterality Date  . Esophagogastroduodenoscopy  12/22/2011    Procedure:  ESOPHAGOGASTRODUODENOSCOPY (EGD);  Surgeon: Iva Boop, MD;  Location: [facility];  Service: Endoscopy;  Laterality: N/A;  . Cardioversion  06/06/06    successful  . Insert / replace / remove pacemaker  06/29/2006    Medtronic adapta  . Cardiac catheterization  10/25/92  . Cardiac catheterization  11/18/03    w/grafts 100%CX LAD 80 & 100%  . Cardiac catheterization  01/24/05    diffuse disease of native vessels  . Cardiac catheterization  06/06/06    severe native CAD  . Coronary artery bypass graft  10/27/92    LIMA to LAD,SVG to LAD second diagonal,obtuse maraginal of the CX and posterior descendingbranch of the RCA  . Cataract extraction w/ intraocular lens  implant, bilateral Bilateral   . Refractive surgery Bilateral     Family History  Problem Relation Age of Onset  . Breast cancer Daughter   . Diabetes Son   . Diabetes Daughter   . Heart disease Father   . Diabetes Father   . Colon polyps Daughter   . Heart attack Brother   . Diabetes Brother   . Stroke Sister     Social History:  reports that she has never smoked. She has never used smokeless tobacco. She reports that she does not drink alcohol or use illicit drugs.  Allergies:  Allergies  Allergen Reactions  . Darvon   . Digoxin And Related Nausea Only  . Nitrostat (Nitroglycerin)   . Other     Tylenol #3  . Penicillins Other (See Comments)    Was told had allergy from childhood... Unknown reaction  . Percocet (Oxycodone-Acetaminophen)   . Percodan (Oxycodone-Aspirin)   . Vicodin (Hydrocodone-Acetaminophen)         Lab Results  Component Value Date   HGBA1C 6.9* 06/13/2012   Office Visit on 07/18/2012  Component Date Value Range Status  . TSH 07/18/2012 3.49  0.35 - 5.50 uIU/mL Final  . Free T4 07/18/2012 0.73  0.60 - 1.60 ng/dL Final  . Sodium 16/10/9602 139  135 - 145 mEq/L Final  . Potassium 07/18/2012 4.1  3.5 - 5.1 mEq/L Final  . Chloride 07/18/2012 108  96 - 112 mEq/L Final  . CO2  07/18/2012 24  19 - 32 mEq/L Final  . Glucose, Bld 07/18/2012 215* 70 - 99 mg/dL  Final  . BUN 07/18/2012 14  6 - 23 mg/dL Final  . Creatinine, Ser 07/18/2012 1.1  0.4 - 1.2 mg/dL Final  . Total Bilirubin 07/18/2012 0.5  0.3 - 1.2 mg/dL Final  . Alkaline Phosphatase 07/18/2012 72  39 - 117 U/L Final  . AST 07/18/2012 20  0 - 37 U/L Final  . ALT 07/18/2012 11  0 - 35 U/L Final  . Total Protein 07/18/2012 6.4  6.0 - 8.3 g/dL Final  . Albumin 86/57/8469 3.1* 3.5 - 5.2 g/dL Final  . Calcium 62/95/2841 9.1  8.4 - 10.5 mg/dL Final  . GFR 32/44/0102 58.17* >60.00 mL/min Final  Anti-coag visit on 07/12/2012  Component Date Value Range Status  . INR 07/12/2012 4.6   Final        Examination:   BP 120/60  Pulse 62  Wt 193 lb 9.6 oz (87.816 kg)  BMI 34.3 kg/m2  SpO2 94%  Body mass index is 34.3 kg/(m^2).   Assesment/plan:   1. Diabetes type 2, uncontrolled - 250.02   The patient's diabetes control appears to be fairly well controlled with A1c 6.9 about 5 weeks ago. This is without any specific treatment or insulin. Considering her age and multiple medical problems he is doing well with no specific treatment. She is generally doing well with diet although has gained weight from the last visit with me. She will continue to monitor her blood sugars periodically at home and call if persistently high  2. Hypothyroidism: Her TSH will be rechecked since she is needing a followup and has been on a lower dose than usual for some time  Addendum: TSH normal  Jakye Mullens 07/21/2012, 5:48 PM

## 2012-07-18 NOTE — Patient Instructions (Signed)
Check blood sugars periodically either on waking up or to 3 hours after  the morning or evening meal, call me if consistently over 200

## 2012-07-21 DIAGNOSIS — E039 Hypothyroidism, unspecified: Secondary | ICD-10-CM | POA: Insufficient documentation

## 2012-07-22 ENCOUNTER — Ambulatory Visit (INDEPENDENT_AMBULATORY_CARE_PROVIDER_SITE_OTHER): Payer: Medicare Other | Admitting: Pharmacist Clinician (PhC)/ Clinical Pharmacy Specialist

## 2012-07-22 VITALS — BP 122/64 | HR 72

## 2012-07-22 DIAGNOSIS — I82402 Acute embolism and thrombosis of unspecified deep veins of left lower extremity: Secondary | ICD-10-CM

## 2012-07-22 DIAGNOSIS — I82409 Acute embolism and thrombosis of unspecified deep veins of unspecified lower extremity: Secondary | ICD-10-CM

## 2012-07-22 DIAGNOSIS — Z7901 Long term (current) use of anticoagulants: Secondary | ICD-10-CM

## 2012-07-24 ENCOUNTER — Encounter: Payer: Self-pay | Admitting: Cardiovascular Disease

## 2012-07-25 ENCOUNTER — Other Ambulatory Visit: Payer: Self-pay | Admitting: Pharmacist Clinician (PhC)/ Clinical Pharmacy Specialist

## 2012-07-25 MED ORDER — WARFARIN SODIUM 5 MG PO TABS: ORAL_TABLET | ORAL | Status: DC

## 2012-07-31 ENCOUNTER — Telehealth: Payer: Self-pay | Admitting: Cardiovascular Disease

## 2012-07-31 ENCOUNTER — Telehealth: Payer: Self-pay | Admitting: Pharmacist Clinician (PhC)/ Clinical Pharmacy Specialist

## 2012-07-31 NOTE — Telephone Encounter (Signed)
Missed one of her Coumadin dosage-need to be advised how to take it!

## 2012-07-31 NOTE — Telephone Encounter (Signed)
Returned call and spoke w/ Alona Bene, pt's daughter.  Stated pt had pain in neck and back and felt like pins were in her arms and feet.  Stated she also buried her daughter on Monday.  Stated pt was seen by Dr. August Saucer yesterday for symptoms and he said it's probably anxiety r/t the death of her daughter.  Stated he was supposed to be calling in something for anxiety, but hasn't yet.  Pt rated pain 6/10 now.  Denied using NTG yesterday.  RN asked Alona Bene to apply NTG patch.  Pt denied any change in chest throbbing, pain after 5 or 10 mins.  Stated pt c/o feeling "cool air" in her chest.  RN discussed w/ S. Lelon Perla, CMA with devices and advised pt f/u with PCP r/t anxiety med and if symptoms persist to call back for an appt for evaluation.  Stated pt seen recently and pacer check was good.  Informed Alona Bene of advice and that pt is likely under a lot of stress w/ having to bury her dau verbalized understanding and agreed w/ plan.  Message forwarded to Dr. Royann Shivers [facility]).

## 2012-07-31 NOTE — Telephone Encounter (Signed)
Heart throbbing and beating fast-also had some chest pains by her pacemaker!

## 2012-07-31 NOTE — Telephone Encounter (Signed)
Pt to take extra 2.5mg  x 1 today, dtr voiced understanding.  Also discussed what time of day to take and what to do in future if she is late at getting dose.

## 2012-08-13 ENCOUNTER — Ambulatory Visit (INDEPENDENT_AMBULATORY_CARE_PROVIDER_SITE_OTHER): Payer: Medicare Other | Admitting: Pharmacist Clinician (PhC)/ Clinical Pharmacy Specialist

## 2012-08-13 DIAGNOSIS — I82402 Acute embolism and thrombosis of unspecified deep veins of left lower extremity: Secondary | ICD-10-CM

## 2012-08-13 DIAGNOSIS — Z7901 Long term (current) use of anticoagulants: Secondary | ICD-10-CM

## 2012-08-13 DIAGNOSIS — I82409 Acute embolism and thrombosis of unspecified deep veins of unspecified lower extremity: Secondary | ICD-10-CM

## 2012-08-13 MED ORDER — WARFARIN SODIUM 5 MG PO TABS: ORAL_TABLET | ORAL | Status: DC

## 2012-08-14 ENCOUNTER — Telehealth: Payer: Self-pay | Admitting: Cardiovascular Disease

## 2012-08-14 MED ORDER — WARFARIN SODIUM 5 MG PO TABS: ORAL_TABLET | ORAL | Status: DC

## 2012-08-14 NOTE — Telephone Encounter (Signed)
Returned call.  Stated new Rx was not received.  Informed Rx was printed and will be sent electronically.  Refill(s) sent to pharmacy.

## 2012-08-14 NOTE — Telephone Encounter (Signed)
Need a new prescription for her Warfin-says there was a dosage change-Please call asap.

## 2012-08-14 NOTE — Telephone Encounter (Signed)
Calling to see if her mother Warfarin have been called in?

## 2012-08-15 NOTE — Telephone Encounter (Signed)
Returned call and informed Rx was printed incidentally and was resent to pharmacy.  Verbalized understanding.

## 2012-08-19 ENCOUNTER — Other Ambulatory Visit: Payer: Self-pay | Admitting: *Deleted

## 2012-08-27 ENCOUNTER — Ambulatory Visit (INDEPENDENT_AMBULATORY_CARE_PROVIDER_SITE_OTHER): Payer: Medicare Other | Admitting: Pharmacist Clinician (PhC)/ Clinical Pharmacy Specialist

## 2012-08-27 DIAGNOSIS — I82409 Acute embolism and thrombosis of unspecified deep veins of unspecified lower extremity: Secondary | ICD-10-CM

## 2012-08-27 DIAGNOSIS — Z7901 Long term (current) use of anticoagulants: Secondary | ICD-10-CM

## 2012-08-27 LAB — POCT INR: INR: 1.8

## 2012-08-28 ENCOUNTER — Ambulatory Visit: Payer: Medicare Other | Admitting: Cardiovascular Disease

## 2012-09-06 ENCOUNTER — Ambulatory Visit: Payer: Medicare Other

## 2012-09-06 ENCOUNTER — Ambulatory Visit (INDEPENDENT_AMBULATORY_CARE_PROVIDER_SITE_OTHER): Payer: Medicare Other | Admitting: Pharmacist Clinician (PhC)/ Clinical Pharmacy Specialist

## 2012-09-06 ENCOUNTER — Ambulatory Visit (INDEPENDENT_AMBULATORY_CARE_PROVIDER_SITE_OTHER): Payer: Medicare Other | Admitting: Cardiovascular Disease

## 2012-09-06 ENCOUNTER — Encounter: Payer: Self-pay | Admitting: Cardiovascular Disease

## 2012-09-06 VITALS — BP 98/56 | HR 66 | Ht 63.0 in | Wt 196.6 lb

## 2012-09-06 DIAGNOSIS — Z7901 Long term (current) use of anticoagulants: Secondary | ICD-10-CM

## 2012-09-06 DIAGNOSIS — I5032 Chronic diastolic (congestive) heart failure: Secondary | ICD-10-CM

## 2012-09-06 DIAGNOSIS — R079 Chest pain, unspecified: Secondary | ICD-10-CM

## 2012-09-06 DIAGNOSIS — I10 Essential (primary) hypertension: Secondary | ICD-10-CM

## 2012-09-06 DIAGNOSIS — N189 Chronic kidney disease, unspecified: Secondary | ICD-10-CM

## 2012-09-06 DIAGNOSIS — N179 Acute kidney failure, unspecified: Secondary | ICD-10-CM

## 2012-09-06 DIAGNOSIS — Z950 Presence of cardiac pacemaker: Secondary | ICD-10-CM

## 2012-09-06 DIAGNOSIS — R52 Pain, unspecified: Secondary | ICD-10-CM

## 2012-09-06 DIAGNOSIS — I2510 Atherosclerotic heart disease of native coronary artery without angina pectoris: Secondary | ICD-10-CM

## 2012-09-06 DIAGNOSIS — E119 Type 2 diabetes mellitus without complications: Secondary | ICD-10-CM

## 2012-09-06 DIAGNOSIS — I2699 Other pulmonary embolism without acute cor pulmonale: Secondary | ICD-10-CM

## 2012-09-06 LAB — POCT INR: INR: 2.1

## 2012-09-06 MED ORDER — TRAMADOL HCL 50 MG PO TABS: 50.0000 mg | ORAL_TABLET | Freq: Three times a day (TID) | ORAL | Status: DC | PRN

## 2012-09-06 NOTE — Patient Instructions (Addendum)
Your physician recommends that you schedule a follow-up appointment in: 4 months  

## 2012-09-11 ENCOUNTER — Ambulatory Visit: Payer: Medicare Other | Admitting: Cardiovascular Disease

## 2012-09-11 ENCOUNTER — Ambulatory Visit: Payer: Medicare Other | Admitting: Pharmacist Clinician (PhC)/ Clinical Pharmacy Specialist

## 2012-09-16 NOTE — Assessment & Plan Note (Signed)
Fair control.

## 2012-09-16 NOTE — Assessment & Plan Note (Signed)
Her current chest pain syndrome is readily reproducible with palpation, associated with changes in position but not with physical activity, located more in her breast and in her chest wall. It is not coronary in etiology. She should not take nonsteroidal anti-inflammatory drugs as she has an uncertain source of gastrointestinal bleeding she can take acetaminophen or tramadol.

## 2012-09-16 NOTE — Assessment & Plan Note (Signed)
Improved creatinine from 1.5-1.1. Creatinine clearance is around 30 consistent with severe grade 3 chronic kidney disease

## 2012-09-16 NOTE — Assessment & Plan Note (Signed)
On warfarin anticoagulation, watch for recurrent GI bleeding

## 2012-09-16 NOTE — Assessment & Plan Note (Signed)
She has some lower extremity edema now but this could be related to obesity and DVT. Otherwise there are no overt signs of hypervolemia or heart failure exacerbation. No changes are made her medications. She has normal left ventricle systolic function (EF 55-60%) by an echo performed in December 2013.

## 2012-09-16 NOTE — Assessment & Plan Note (Signed)
Adequate control. 

## 2012-09-16 NOTE — Progress Notes (Signed)
Patient ID: [name], female   DOB: [birth year], 79 y.o.   MRN: [id number]      Reason for office visit Chest pain  [name] is 79 years old and has multiple medical problems including coronary disease, previous bypass surgery, permanent pacemaker for high-grade AV block, paroxysmal atrial fibrillation, recent upper shotty deep venous thrombosis associated with a venous catheter and recurrent problems with severe edema due to gastrointestinal bleeding from a non-identifiable source. Recurrent bleeding requiring transfusions has been associated with both antiplatelet and anticoagulant therapy. Antiplatelets have been discontinued but she is back on warfarin secondary to her upper extremity DVT.  [name] asked to be seen today because of chest pain. She describes what sounds like typical musculoskeletal left-sided chest wall pain. The pain is readily reproducible with palpation to the precordial area. Not sure whether she has costochondritis, but she does we has a very tender left breast. The pain is modified by changes in position and commonly radiates down both hands. She has some residual swelling in her right arm portion of the PICC line and an associated deep venous thrombosis. She has not had problems of chest pain shortness of breath cough or hemoptysis.  A full pacemaker check was also performed on June 2014 and the device was found to be functioning normally. She has normal left ventricular systolic function. She has diabetes mellitus and is legally blind.    Allergies  Allergen Reactions  . Darvon   . Digoxin And Related Nausea Only  . Nitrostat [Nitroglycerin]   . Other     Tylenol #3  . Penicillins Other (See Comments)    Was told had allergy from childhood... Unknown reaction  . Percocet [Oxycodone-Acetaminophen]   . Percodan [Oxycodone-Aspirin]   . Vicodin [Hydrocodone-Acetaminophen]     Current Outpatient Prescriptions  Medication Sig Dispense Refill  .  ACCU-CHEK AVIVA PLUS test strip 2 (two) times daily.      Marland Kitchen ACCU-CHEK FASTCLIX LANCETS MISC 2 (two) times daily.      Marland Kitchen aspirin 81 MG chewable tablet Chew 81 mg by mouth daily.      . feeding supplement (ENSURE COMPLETE) LIQD Take 237 mLs by mouth daily.      . folic acid (FOLVITE) 1 MG tablet Take 1 mg by mouth daily.      Marland Kitchen gabapentin (NEURONTIN) 100 MG capsule Take 100 mg by mouth 2 (two) times daily.       . isosorbide mononitrate (IMDUR) 30 MG 24 hr tablet Take 15 mg by mouth daily.      Marland Kitchen ketoconazole (NIZORAL) 2 % cream Apply 1 application topically daily.      Marland Kitchen levothyroxine (SYNTHROID, LEVOTHROID) 50 MCG tablet Take 50 mcg by mouth daily.      Marland Kitchen loratadine (CLARITIN) 10 MG tablet Take 10 mg by mouth daily. For allergies      . metoprolol tartrate (LOPRESSOR) 25 MG tablet Take 12.5 mg by mouth 2 (two) times daily.      . nitroGLYCERIN (NITRODUR - DOSED IN MG/24 HR) 0.2 mg/hr Place 1 patch (0.2 mg total) onto the skin daily as needed (chest pain).  30 patch  0  . pantoprazole (PROTONIX) 40 MG tablet Take 1 tablet (40 mg total) by mouth 2 (two) times daily before a meal.  60 tablet  1  . rosuvastatin (CRESTOR) 10 MG tablet Take 10 mg by mouth daily.      . Vitamin D, Ergocalciferol, (DRISDOL) 50000 UNITS CAPS Take 50,000 Units by  mouth every 7 (seven) days. Mondays      . warfarin (COUMADIN) 5 MG tablet Take 1 tablet by mouth once daily or as directed  30 tablet  6  . zolpidem (AMBIEN) 10 MG tablet Take 5 mg by mouth at bedtime as needed. sleep      . traMADol (ULTRAM) 50 MG tablet Take 1 tablet (50 mg total) by mouth every 8 (eight) hours as needed for pain.  30 tablet  3   No current facility-administered medications for this visit.    Past Medical History  Diagnosis Date  . Hypertension   . Coronary artery disease   . Legally blind   . Chronic kidney disease   . Anemia   . Hypothyroid   . Gastric ulcer   . Hiatal hernia   . Dyspepsia   . UTI (lower urinary tract infection)    . H/O: GI bleed 12//13  . Diastolic dysfunction   . Pacemaker   . High cholesterol   . CHF (congestive heart failure)   . Anginal pain   . MI (myocardial infarction) ? 1994  . DVT of upper extremity (deep vein thrombosis) 06/13/2012    BUE  . Seasonal allergies   . Allergy to perfume     "strong perfumes only" (06/13/2012)  . Shortness of breath     "can happen at any time" (06/13/2012)  . Type II diabetes mellitus   . History of blood transfusion 2013    "not related to OR; BP had dropped" (06/13/2012)  . GERD (gastroesophageal reflux disease)   . Daily headache   . Arthritis     "all over" (06/13/2012)  . A-fib   . 2Nd degree atrioventricular block     Past Surgical History  Procedure Laterality Date  . Esophagogastroduodenoscopy  12/22/2011    Procedure: ESOPHAGOGASTRODUODENOSCOPY (EGD);  Surgeon: Iva Boop, MD;  Location: [facility];  Service: Endoscopy;  Laterality: N/A;  . Cardioversion  06/06/06    successful  . Insert / replace / remove pacemaker  06/29/2006    Medtronic adapta  . Cardiac catheterization  10/25/92  . Cardiac catheterization  11/18/03    w/grafts 100%CX LAD 80 & 100%  . Cardiac catheterization  01/24/05    diffuse disease of native vessels  . Cardiac catheterization  06/06/06    severe native CAD  . Coronary artery bypass graft  10/27/92    LIMA to LAD,SVG to LAD second diagonal,obtuse maraginal of the CX and posterior descendingbranch of the RCA  . Cataract extraction w/ intraocular lens  implant, bilateral Bilateral   . Refractive surgery Bilateral     Family History  Problem Relation Age of Onset  . Breast cancer Daughter   . Diabetes Son   . Diabetes Daughter   . Heart disease Father   . Diabetes Father   . Colon polyps Daughter   . Heart attack Brother   . Diabetes Brother   . Stroke Sister     History   Social History  . Marital Status: Married    Spouse Name: N/A    Number of Children: 10  . Years of Education: N/A    Occupational History  .     Social History Main Topics  . Smoking status: Never Smoker   . Smokeless tobacco: Never Used  . Alcohol Use: No  . Drug Use: No  . Sexual Activity: No   Other Topics Concern  . Not on file   Social History Narrative  .  No narrative on file    Review of systems: Chest pain as described above. Mild edema in the ankles bilaterally in a symmetrical pattern The patient specifically denies any dyspnea at rest or with exertion, orthopnea, paroxysmal nocturnal dyspnea, syncope, palpitations, focal neurological deficits, intermittent claudication,  unexplained weight gain, cough, hemoptysis or wheezing.  The patient also denies abdominal pain, nausea, vomiting, dysphagia, diarrhea, constipation, polyuria, polydipsia, dysuria, hematuria, frequency, urgency, abnormal bleeding or bruising, fever, chills, unexpected weight changes, mood swings, change in skin or hair texture, change in voice quality, auditory or visual problems, allergic reactions or rashes, new musculoskeletal complaints other than usual "aches and pains".   PHYSICAL EXAM BP 98/56  Pulse 66  Ht 5\' 3"  (1.6 m)  Wt 196 lb 9.6 oz (89.177 kg)  BMI 34.83 kg/m2   General: Alert, oriented x3, no distress, moderately obese Head: no evidence of trauma, PERRL, EOMI, no exophtalmos or lid lag, no myxedema, no xanthelasma; normal ears, nose and oropharynx Neck: normal jugular venous pulsations and no hepatojugular reflux; brisk carotid pulses without delay and no carotid bruits Chest: clear to auscultation, no signs of consolidation by percussion or palpation, normal fremitus, symmetrical and full respiratory excursions Cardiovascular: normal position and quality of the apical impulse, regular rhythm, normal first and second heart sounds, no murmurs, rubs or gallops Abdomen: no tenderness or distention, no masses by palpation, no abnormal pulsatility or arterial bruits, normal bowel sounds, no  hepatosplenomegaly Extremities: no clubbing, cyanosis; 1+ symmetrical pitting ankle edema; 2+ radial, ulnar and brachial pulses bilaterally; beneath the femoral level it is hard to palpate any pulses in her lower showed; no subclavian or femoral bruits Neurological: grossly nonfocal, poor vision   EKG: Sinus rhythm, atrial sensed ventricular paced  Lipid Panel    BMET    Component Value Date/Time   NA 139 07/18/2012 1526   K 4.1 07/18/2012 1526   CL 108 07/18/2012 1526   CO2 24 07/18/2012 1526   GLUCOSE 215* 07/18/2012 1526   BUN 14 07/18/2012 1526   CREATININE 1.1 07/18/2012 1526   CALCIUM 9.1 07/18/2012 1526   GFRNONAA 28* 06/18/2012 1418   GFRAA 32* 06/18/2012 1418     ASSESSMENT AND PLAN Chest pain Her current chest pain syndrome is readily reproducible with palpation, associated with changes in position but not with physical activity, located more in her breast and in her chest wall. It is not coronary in etiology. She should not take nonsteroidal anti-inflammatory drugs as she has an uncertain source of gastrointestinal bleeding she can take acetaminophen or tramadol.  Chronic diastolic heart failure She has some lower extremity edema now but this could be related to obesity and DVT. Otherwise there are no overt signs of hypervolemia or heart failure exacerbation. No changes are made her medications. She has normal left ventricle systolic function (EF 55-60%) by an echo performed in December 2013.  Coronary artery disease, CABG in 1994 with last cath 2008 patent grafts and normal EF She is not a good candidate for percutaneous revascularization since she has developed marked bleeding with antiplatelet agents  Hypertension Adequate control  Type II or unspecified type diabetes mellitus without mention of complication, not stated as uncontrolled Fair control  Acute pulmonary embolism On warfarin anticoagulation, watch for recurrent GI bleeding  Acute on chronic renal failure Improved  creatinine from 1.5-1.1. Creatinine clearance is around 30 consistent with severe grade 3 chronic kidney disease   Orders Placed This Encounter  Procedures  . EKG 12-Lead  Meds ordered this encounter  Medications  . ketoconazole (NIZORAL) 2 % cream    Sig: Apply 1 application topically daily.  . traMADol (ULTRAM) 50 MG tablet    Sig: Take 1 tablet (50 mg total) by mouth every 8 (eight) hours as needed for pain.    Dispense:  30 tablet    Refill:  3    Levi Klaiber  Thurmon Fair, MD, [facility] [phone] office [phone] pager

## 2012-09-16 NOTE — Assessment & Plan Note (Signed)
She is not a good candidate for percutaneous revascularization since she has developed marked bleeding with antiplatelet agents

## 2012-09-16 NOTE — Assessment & Plan Note (Signed)
Normal device function by recent check

## 2012-09-20 ENCOUNTER — Other Ambulatory Visit: Payer: Self-pay | Admitting: *Deleted

## 2012-09-20 MED ORDER — ISOSORBIDE MONONITRATE ER 30 MG PO TB24: 15.0000 mg | ORAL_TABLET | Freq: Every day | ORAL | Status: DC

## 2012-09-20 NOTE — Telephone Encounter (Signed)
Rx was sent to pharmacy electronically. 

## 2012-09-27 ENCOUNTER — Ambulatory Visit (INDEPENDENT_AMBULATORY_CARE_PROVIDER_SITE_OTHER): Payer: Medicare Other | Admitting: Pharmacist Clinician (PhC)/ Clinical Pharmacy Specialist

## 2012-09-27 VITALS — BP 118/82 | HR 72

## 2012-09-27 DIAGNOSIS — Z7901 Long term (current) use of anticoagulants: Secondary | ICD-10-CM

## 2012-09-27 DIAGNOSIS — I82409 Acute embolism and thrombosis of unspecified deep veins of unspecified lower extremity: Secondary | ICD-10-CM

## 2012-10-09 ENCOUNTER — Other Ambulatory Visit: Payer: Self-pay | Admitting: Cardiovascular Disease

## 2012-10-09 ENCOUNTER — Ambulatory Visit (INDEPENDENT_AMBULATORY_CARE_PROVIDER_SITE_OTHER): Payer: Medicare Other | Admitting: Cardiovascular Disease

## 2012-10-09 VITALS — BP 140/80 | HR 64 | Resp 16 | Ht 64.0 in | Wt 197.3 lb

## 2012-10-09 DIAGNOSIS — I2510 Atherosclerotic heart disease of native coronary artery without angina pectoris: Secondary | ICD-10-CM

## 2012-10-09 DIAGNOSIS — I5032 Chronic diastolic (congestive) heart failure: Secondary | ICD-10-CM

## 2012-10-09 DIAGNOSIS — Z950 Presence of cardiac pacemaker: Secondary | ICD-10-CM

## 2012-10-09 DIAGNOSIS — R509 Fever, unspecified: Secondary | ICD-10-CM

## 2012-10-09 LAB — PACEMAKER DEVICE OBSERVATION

## 2012-10-09 NOTE — Patient Instructions (Addendum)
Your Pacemaker was interrogated today.  It needs to be rechecked in 3 months and our office will call to schedule this appointment.  Your physician recommends that you schedule a follow-up appointment in: 6 Months.

## 2012-10-10 ENCOUNTER — Encounter: Payer: Self-pay | Admitting: Cardiovascular Disease

## 2012-10-10 NOTE — Progress Notes (Signed)
Patient ID: [name], female   DOB: 06-Jan-1921, 79 y.o.   MRN: [id number]     Reason for office visit Visual disturbances  Mrs. Mcdermid has numerous cardiac problems including paroxysmal atrial fibrillation for which she cannot receive anticoagulation secondary to recurrent severe gastrointestinal bleeding. She has coronary disease and underwent bypass surgery 20 years ago. She has always had preserved left extrasystolic function. She has a dual-chamber permanent pacemaker in place. She is morbidly obese and extremely sedentary. 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  Recently she had some unusual visual complaints. She states that through her remaining "good" eye she was seen 8 "checkerboard" like pattern with a reddish black tinge to the areas of visual obstruction. She has never expressed as before. She thinks it lasted for about an hour. She has been evaluated by her ophthalmologist who could not find an explanation for her complaints and postulated that she might have had visual hallucinations. He did mention that if it happens again she should seek cardiology evaluation and for this reason she is now here. We interrogated her pacemaker and there is no evidence of any atrial fibrillation for many months including none within the last several weeks. The most recent episode of significant atrial fibrillation/atrial tachycardia occurred in February. She denies any change in her chronic mild shortness of breath and has not expressed any chest pain. She did describe some problems with hearing in her right ear which was "popping" this has not improved    Allergies  Allergen Reactions  . Darvon   . Digoxin And Related Nausea Only  . Nitrostat [Nitroglycerin]   . Other     Tylenol #3  . Penicillins Other (See Comments)    Was told had allergy from childhood... Unknown reaction  . Percocet [Oxycodone-Acetaminophen]   . Percodan  [Oxycodone-Aspirin]   . Vicodin [Hydrocodone-Acetaminophen]     Current Outpatient Prescriptions  Medication Sig Dispense Refill  . ACCU-CHEK AVIVA PLUS test strip 2 (two) times daily.      Marland Kitchen ACCU-CHEK FASTCLIX LANCETS MISC 2 (two) times daily.      Marland Kitchen aspirin 81 MG chewable tablet Chew 81 mg by mouth daily.      . feeding supplement (ENSURE COMPLETE) LIQD Take 237 mLs by mouth daily.      . folic acid (FOLVITE) 1 MG tablet Take 1 mg by mouth daily.      Marland Kitchen gabapentin (NEURONTIN) 100 MG capsule Take 100 mg by mouth 2 (two) times daily.       . isosorbide mononitrate (IMDUR) 30 MG 24 hr tablet Take 0.5 tablets (15 mg total) by mouth daily.  15 tablet  11  . ketoconazole (NIZORAL) 2 % cream Apply 1 application topically daily.      Marland Kitchen levothyroxine (SYNTHROID, LEVOTHROID) 50 MCG tablet Take 50 mcg by mouth daily.      Marland Kitchen loratadine (CLARITIN) 10 MG tablet Take 10 mg by mouth daily. For allergies      . metoprolol tartrate (LOPRESSOR) 25 MG tablet Take 12.5 mg by mouth 2 (two) times daily.      . nitroGLYCERIN (NITRODUR - DOSED IN MG/24 HR) 0.2 mg/hr Place 1 patch (0.2 mg total) onto the skin daily as needed (chest pain).  30 patch  0  . pantoprazole (PROTONIX) 40 MG tablet Take 1 tablet (40 mg total) by mouth 2 (two) times daily before a meal.  60 tablet  1  .  rosuvastatin (CRESTOR) 10 MG tablet Take 10 mg by mouth daily.      . traMADol (ULTRAM) 50 MG tablet Take 1 tablet (50 mg total) by mouth every 8 (eight) hours as needed for pain.  30 tablet  3  . Vitamin D, Ergocalciferol, (DRISDOL) 50000 UNITS CAPS Take 50,000 Units by mouth every 7 (seven) days. Mondays      . warfarin (COUMADIN) 5 MG tablet Take 1 tablet by mouth once daily or as directed  30 tablet  6  . zolpidem (AMBIEN) 10 MG tablet Take 5 mg by mouth at bedtime as needed. sleep       No current facility-administered medications for this visit.    Past Medical History  Diagnosis Date  . Hypertension   . Coronary artery disease     . Legally blind   . Chronic kidney disease   . Anemia   . Hypothyroid   . Gastric ulcer   . Hiatal hernia   . Dyspepsia   . UTI (lower urinary tract infection)   . H/O: GI bleed 12//13  . Diastolic dysfunction   . Pacemaker   . High cholesterol   . CHF (congestive heart failure)   . Anginal pain   . MI (myocardial infarction) ? 1994  . DVT of upper extremity (deep vein thrombosis) 06/13/2012    BUE  . Seasonal allergies   . Allergy to perfume     "strong perfumes only" (06/13/2012)  . Shortness of breath     "can happen at any time" (06/13/2012)  . Type II diabetes mellitus   . History of blood transfusion 2013    "not related to OR; BP had dropped" (06/13/2012)  . GERD (gastroesophageal reflux disease)   . Daily headache   . Arthritis     "all over" (06/13/2012)  . A-fib   . 2Nd degree atrioventricular block     Past Surgical History  Procedure Laterality Date  . Esophagogastroduodenoscopy  12/22/2011    Procedure: ESOPHAGOGASTRODUODENOSCOPY (EGD);  Surgeon: Iva Boop, MD;  Location: [facility];  Service: Endoscopy;  Laterality: N/A;  . Cardioversion  06/06/06    successful  . Insert / replace / remove pacemaker  06/29/2006    Medtronic adapta  . Cardiac catheterization  10/25/92  . Cardiac catheterization  11/18/03    w/grafts 100%CX LAD 80 & 100%  . Cardiac catheterization  01/24/05    diffuse disease of native vessels  . Cardiac catheterization  06/06/06    severe native CAD  . Coronary artery bypass graft  10/27/92    LIMA to LAD,SVG to LAD second diagonal,obtuse maraginal of the CX and posterior descendingbranch of the RCA  . Cataract extraction w/ intraocular lens  implant, bilateral Bilateral   . Refractive surgery Bilateral     Family History  Problem Relation Age of Onset  . Breast cancer Daughter   . Diabetes Son   . Diabetes Daughter   . Heart disease Father   . Diabetes Father   . Colon polyps Daughter   . Heart attack Brother   . Diabetes  Brother   . Stroke Sister     History   Social History  . Marital Status: Married    Spouse Name: N/A    Number of Children: 10  . Years of Education: N/A   Occupational History  .     Social History Main Topics  . Smoking status: Never Smoker   . Smokeless tobacco: Never Used  .  Alcohol Use: No  . Drug Use: No  . Sexual Activity: No   Other Topics Concern  . Not on file   Social History Narrative  . No narrative on file    Review of systems: Mild edema in the ankles bilaterally in a symmetrical pattern  The patient specifically denies any dyspnea at rest or change in mild dyspnea with exertion, orthopnea, paroxysmal nocturnal dyspnea, syncope, palpitations, focal neurological deficits, intermittent claudication, unexplained weight gain, cough, hemoptysis or wheezing.  The patient also denies abdominal pain, nausea, vomiting, dysphagia, diarrhea, constipation, polyuria, polydipsia, dysuria, hematuria, frequency, urgency, abnormal bleeding or bruising, fever, chills, unexpected weight changes, mood swings, change in skin or hair texture, change in voice quality, auditory or visual problems, allergic reactions or rashes, new musculoskeletal complaints other than usual "aches and pains".   PHYSICAL EXAM BP 140/80  Pulse 64  Ht 5\' 4"  (1.626 m)  Wt 197 lb 4.8 oz (89.495 kg)  BMI 33.85 kg/m2 General: Alert, oriented x3, no distress, moderately obese  Head: no evidence of trauma, PERRL, EOMI, no exophtalmos or lid lag, no myxedema, no xanthelasma; normal ears, nose and oropharynx  Neck: normal jugular venous pulsations and no hepatojugular reflux; brisk carotid pulses without delay and no carotid bruits  Chest: clear to auscultation, no signs of consolidation by percussion or palpation, normal fremitus, symmetrical and full respiratory excursions  Cardiovascular: normal position and quality of the apical impulse, regular rhythm, normal first and second heart sounds, no murmurs,  rubs or gallops  Abdomen: no tenderness or distention, no masses by palpation, no abnormal pulsatility or arterial bruits, normal bowel sounds, no hepatosplenomegaly  Extremities: no clubbing, cyanosis; 1+ symmetrical pitting ankle edema; 2+ radial, ulnar and brachial pulses bilaterally; beneath the femoral level it is hard to palpate any pulses in her lower showed; no subclavian or femoral bruits  Neurological: grossly nonfocal, poor vision  Pacemaker interrogation shows normal device function and no recent arrhythmia.  Lipid Panel     Component Value Date/Time   CHOL  Value: 148        ATP III CLASSIFICATION:  <200     mg/dL   Desirable  782-956  mg/dL   Borderline High  >=213    mg/dL   High 0/86/5784 6962   TRIG 103 08/10/2007 0400   HDL 37* 08/10/2007 0400   CHOLHDL 4.0 08/10/2007 0400   VLDL 21 08/10/2007 0400   LDLCALC  Value: 90        Total Cholesterol/HDL:CHD Risk Coronary Heart Disease Risk Table                     Men   Women  1/2 Average Risk   3.4   3.3 08/10/2007 0400    BMET    Component Value Date/Time   NA 139 07/18/2012 1526   K 4.1 07/18/2012 1526   CL 108 07/18/2012 1526   CO2 24 07/18/2012 1526   GLUCOSE 215* 07/18/2012 1526   BUN 14 07/18/2012 1526   CREATININE 1.1 07/18/2012 1526   CALCIUM 9.1 07/18/2012 1526   GFRNONAA 28* 06/18/2012 1418   GFRAA 32* 06/18/2012 1418     ASSESSMENT AND PLAN  Although I cannot explain her recent visual complaints, do not think they have vascular etiology. The unusual "checkerboard" like pattern makes an embolic event unlikely. She has had an ophthalmological exam recently. Chest not had any arrhythmia and appears to be hemodynamically compensated. No changes were made her medications today.  Holli Humbles, MD, [place] [facility] [phone] office [phone] pager

## 2012-10-11 LAB — PACEMAKER DEVICE OBSERVATION
AL AMPLITUDE: 2 mv
ATRIAL PACING PM: 31
BAMS-0001: 140 {beats}/min
BATTERY VOLTAGE: 2.75 V
VENTRICULAR PACING PM: 99.5

## 2012-10-18 ENCOUNTER — Ambulatory Visit (INDEPENDENT_AMBULATORY_CARE_PROVIDER_SITE_OTHER): Payer: Medicare Other | Admitting: Endocrinology

## 2012-10-18 ENCOUNTER — Encounter: Payer: Self-pay | Admitting: Endocrinology

## 2012-10-18 VITALS — BP 138/68 | HR 68 | Temp 98.5°F | Resp 12

## 2012-10-18 DIAGNOSIS — E039 Hypothyroidism, unspecified: Secondary | ICD-10-CM

## 2012-10-18 DIAGNOSIS — E119 Type 2 diabetes mellitus without complications: Secondary | ICD-10-CM

## 2012-10-18 NOTE — Patient Instructions (Addendum)
Please check blood sugars at least half the time about 2 hours after any meal and as directed on waking up. Call if staying > 200  Please bring blood sugar monitor to each visit Limit drinks with sugar to small portions

## 2012-10-18 NOTE — Progress Notes (Signed)
Patient ID: [name], female   DOB: 01-21-1920, 79 y.o.   MRN: [id number]  Reason for Appointment: Diabetes follow-up    History of Present Illness   Diagnosis: Type 2 diabetes mellitus, date of diagnosis: 1972.   She had been on insulin for several years and this had been tapered off in 2013 because of weight loss and lower blood sugars..  She did not bring her glucose monitor today for download Because of her age and multiple medical problems she has been told not to take any insulin because of only modestly high readings She thinks her blood sugars in the last month or so although generally below 160 although she checking them only before meals She appears to have gained some weight from last year Also cannot take metformin because of renal dysfunction  Monitors blood glucose: once-twice daily.  Glucometer: Accucheck Aviva.  Blood Glucose readings: Glucometer not downloaded but readings from recall: readings before breakfast: 100-130 morning and 130-160 acs  Hypoglycemia frequency: None .  Meals: 2 meals at noon and 8 pm, appetite better, some Glucerna lemonade sprite Physical activity: exercise:none.  Dietician visit: Most recent:2001.  Complications: peripheral neuropathy. having some pains in her feet on and off .  Urine microalbumin normal previously     Medication List       This list is accurate as of: 10/18/12 11:35 AM.  Always use your most recent med list.               ACCU-CHEK AVIVA PLUS test strip  Generic drug:  glucose blood  2 (two) times daily.     ACCU-CHEK FASTCLIX LANCETS Misc  2 (two) times daily.     aspirin 81 MG chewable tablet  Chew 81 mg by mouth daily.     feeding supplement Liqd  Take 237 mLs by mouth daily.     folic acid 1 MG tablet  Commonly known as:  FOLVITE  Take 1 mg by mouth daily.     gabapentin 100 MG capsule  Commonly known as:  NEURONTIN  Take 100 mg by mouth 2 (two) times daily.     isosorbide mononitrate  30 MG 24 hr tablet  Commonly known as:  IMDUR  Take 0.5 tablets (15 mg total) by mouth daily.     ketoconazole 2 % cream  Commonly known as:  NIZORAL  Apply 1 application topically daily.     levothyroxine 50 MCG tablet  Commonly known as:  SYNTHROID, LEVOTHROID  Take 50 mcg by mouth daily.     loratadine 10 MG tablet  Commonly known as:  CLARITIN  Take 10 mg by mouth daily. For allergies     metoprolol tartrate 25 MG tablet  Commonly known as:  LOPRESSOR  Take 12.5 mg by mouth 2 (two) times daily.     nitroGLYCERIN 0.2 mg/hr patch  Commonly known as:  NITRODUR - Dosed in mg/24 hr  Place 1 patch (0.2 mg total) onto the skin daily as needed (chest pain).     pantoprazole 40 MG tablet  Commonly known as:  PROTONIX  Take 1 tablet (40 mg total) by mouth 2 (two) times daily before a meal.     rosuvastatin 10 MG tablet  Commonly known as:  CRESTOR  Take 10 mg by mouth daily.     traMADol 50 MG tablet  Commonly known as:  ULTRAM  Take 1 tablet (50 mg total) by mouth every 8 (eight) hours as needed for pain.  Vitamin D (Ergocalciferol) 50000 UNITS Caps capsule  Commonly known as:  DRISDOL  Take 50,000 Units by mouth every 7 (seven) days. Mondays     warfarin 5 MG tablet  Commonly known as:  COUMADIN  Take 1 tablet by mouth once daily or as directed     zolpidem 10 MG tablet  Commonly known as:  AMBIEN  Take 5 mg by mouth at bedtime as needed. sleep        Past Medical History  Diagnosis Date  . Hypertension   . Coronary artery disease   . Legally blind   . Chronic kidney disease   . Anemia   . Hypothyroid   . Gastric ulcer   . Hiatal hernia   . Dyspepsia   . UTI (lower urinary tract infection)   . H/O: GI bleed 12//13  . Diastolic dysfunction   . Pacemaker   . High cholesterol   . CHF (congestive heart failure)   . Anginal pain   . MI (myocardial infarction) ? 1994  . DVT of upper extremity (deep vein thrombosis) 06/13/2012    BUE  . Seasonal  allergies   . Allergy to perfume     "strong perfumes only" (06/13/2012)  . Shortness of breath     "can happen at any time" (06/13/2012)  . Type II diabetes mellitus   . History of blood transfusion 2013    "not related to OR; BP had dropped" (06/13/2012)  . GERD (gastroesophageal reflux disease)   . Daily headache   . Arthritis     "all over" (06/13/2012)  . A-fib   . 2Nd degree atrioventricular block     Past Surgical History  Procedure Laterality Date  . Esophagogastroduodenoscopy  12/22/2011    Procedure: ESOPHAGOGASTRODUODENOSCOPY (EGD);  Surgeon: Iva Boop, MD;  Location: [facility];  Service: Endoscopy;  Laterality: N/A;  . Cardioversion  06/06/06    successful  . Insert / replace / remove pacemaker  06/29/2006    Medtronic adapta  . Cardiac catheterization  10/25/92  . Cardiac catheterization  11/18/03    w/grafts 100%CX LAD 80 & 100%  . Cardiac catheterization  01/24/05    diffuse disease of native vessels  . Cardiac catheterization  06/06/06    severe native CAD  . Coronary artery bypass graft  10/27/92    LIMA to LAD,SVG to LAD second diagonal,obtuse maraginal of the CX and posterior descendingbranch of the RCA  . Cataract extraction w/ intraocular lens  implant, bilateral Bilateral   . Refractive surgery Bilateral     Family History  Problem Relation Age of Onset  . Breast cancer Daughter   . Diabetes Son   . Diabetes Daughter   . Heart disease Father   . Diabetes Father   . Colon polyps Daughter   . Heart attack Brother   . Diabetes Brother   . Stroke Sister     Social History:  reports that she has never smoked. She has never used smokeless tobacco. She reports that she does not drink alcohol or use illicit drugs.  Allergies:  Allergies  Allergen Reactions  . Darvon   . Digoxin And Related Nausea Only  . Nitrostat [Nitroglycerin]   . Other     Tylenol #3  . Penicillins Other (See Comments)    Was told had allergy from childhood... Unknown  reaction  . Percocet [Oxycodone-Acetaminophen]   . Percodan [Oxycodone-Aspirin]   . Vicodin [Hydrocodone-Acetaminophen]     REVIEW of systems:  She  has had renal dysfunction which is long-standing Asking about various skin lesions Asking about occasional shakiness of her right arm Noticed and increase in pedal edema. Does wear elastic stockings   Lab Results  Component Value Date   HGBA1C 6.9* 06/13/2012     Examination:   BP 138/68  Pulse 68  Temp(Src) 98.5 F (36.9 C)  Resp 12  SpO2 95%  There is no weight on file to calculate BMI.   Mild puffiness of the present. She has a reddish 1 cm macule with dark center on her left forearm Diabetic foot exam today  Assesment/plan:   1. Diabetes type 2, long-standing and previously on insulin. Currently she is still doing fairly well without insulin although she has gained weight back. We'll need to evaluate her A1c periodically to assess overall control also. Discussed need for some readings after meals also which her daughter can check. She is on a fairly liberal diet and still likes to drink some drinks with sugar. Considering her age and multiple medical problems she is doing well with no drug for diabetes. She is overall still watching portions  She will continue to monitor her blood sugars periodically at home and call if persistently. May consider low-dose Januvia if having increasing hyperglycemia  2. Hypothyroidism: She has been on a lower dose than usual for some time and TSH has been still fairly good  3. She has significant neuropathy with sensory loss. Currently not very mobile and does not need any special shoes  4. CKD: Creatinine was 1.1 in July,  will request recent labs from PCP   [facility] 10/18/2012, 11:35 AM

## 2012-10-25 ENCOUNTER — Ambulatory Visit (INDEPENDENT_AMBULATORY_CARE_PROVIDER_SITE_OTHER): Payer: Medicare Other | Admitting: Pharmacist Clinician (PhC)/ Clinical Pharmacy Specialist

## 2012-10-25 VITALS — BP 138/80 | HR 64

## 2012-10-25 DIAGNOSIS — Z7901 Long term (current) use of anticoagulants: Secondary | ICD-10-CM

## 2012-10-25 DIAGNOSIS — I82409 Acute embolism and thrombosis of unspecified deep veins of unspecified lower extremity: Secondary | ICD-10-CM

## 2012-10-25 LAB — POCT INR: INR: 2.5

## 2012-10-28 ENCOUNTER — Other Ambulatory Visit: Payer: Self-pay | Admitting: *Deleted

## 2012-10-28 ENCOUNTER — Other Ambulatory Visit: Payer: Self-pay | Admitting: Internal Medicine

## 2012-10-28 ENCOUNTER — Ambulatory Visit
Admission: RE | Admit: 2012-10-28 | Discharge: 2012-10-28 | Disposition: A | Discharge status: Home / Self Care | Payer: Medicare Other | Source: Ambulatory Visit | Attending: Internal Medicine | Admitting: Internal Medicine

## 2012-10-28 DIAGNOSIS — R079 Chest pain, unspecified: Secondary | ICD-10-CM

## 2012-10-28 DIAGNOSIS — M940 Chondrocostal junction syndrome [Tietze]: Secondary | ICD-10-CM

## 2012-10-28 MED ORDER — VITAMIN D (ERGOCALCIFEROL) 1.25 MG (50000 UNIT) PO CAPS: 50000.0000 [IU] | ORAL_CAPSULE | ORAL | Status: DC

## 2012-11-01 ENCOUNTER — Ambulatory Visit (INDEPENDENT_AMBULATORY_CARE_PROVIDER_SITE_OTHER): Payer: Medicare Other | Admitting: Physician Assistant

## 2012-11-01 ENCOUNTER — Telehealth: Payer: Self-pay | Admitting: Cardiovascular Disease

## 2012-11-01 ENCOUNTER — Encounter: Payer: Self-pay | Admitting: Physician Assistant

## 2012-11-01 VITALS — BP 130/80 | HR 72 | Ht 64.0 in

## 2012-11-01 DIAGNOSIS — R079 Chest pain, unspecified: Secondary | ICD-10-CM | POA: Insufficient documentation

## 2012-11-01 DIAGNOSIS — R0789 Other chest pain: Secondary | ICD-10-CM

## 2012-11-01 NOTE — Progress Notes (Signed)
Date:  11/01/2012   ID:  [name], DOB 1920/05/18, MRN [id number]  PCP:  August Saucer [name], MD  Primary Cardiologist:  [facility]     History of Present Illness: [name] is a 79 y.o. female who is nonambulatory and has a history of coronary disease, previous bypass surgery, permanent pacemaker for high-grade AV block, paroxysmal atrial fibrillation, recent upper extremity deep venous thrombosis associated with a venous catheter and recurrent problems with severe edema due to gastrointestinal bleeding.  She is on coumadin and her last INR was 2.5.  Recurrent bleeding, requiring transfusions, was associated with both antiplatelet and anticoagulant therapy.  She had EGD in December of 2013 which showed some gastric erosions and ulcer. Antiplatelets have been discontinued but she is back on warfarin secondary to her upper extremity DVT.  Patient presents today because her caregiver was bathing her and thought her pacer site looked swollen. Patient does complain of chest discomfort typically all over the left side, above and below her left breast.  She also complains of some mild shortness of breath and lower extremity edema but denes paroxysmal nocturnal dyspnea or orthopnea.  She also denies nausea, vomiting, fever, dizziness, cough, congestion, abdominal pain, hematochezia, melena.  Wt Readings from Last 3 Encounters:  10/09/12 197 lb 4.8 oz (89.495 kg)  09/06/12 196 lb 9.6 oz (89.177 kg)  07/18/12 193 lb 9.6 oz (87.816 kg)     Past Medical History  Diagnosis Date  . Hypertension   . Coronary artery disease   . Legally blind   . Chronic kidney disease   . Anemia   . Hypothyroid   . Gastric ulcer   . Hiatal hernia   . Dyspepsia   . UTI (lower urinary tract infection)   . H/O: GI bleed 12//13  . Diastolic dysfunction   . Pacemaker   . High cholesterol   . CHF (congestive heart failure)   . Anginal pain   . MI (myocardial infarction) ? 1994  . DVT of upper extremity  (deep vein thrombosis) 06/13/2012    BUE  . Seasonal allergies   . Allergy to perfume     "strong perfumes only" (06/13/2012)  . Shortness of breath     "can happen at any time" (06/13/2012)  . Type II diabetes mellitus   . History of blood transfusion 2013    "not related to OR; BP had dropped" (06/13/2012)  . GERD (gastroesophageal reflux disease)   . Daily headache   . Arthritis     "all over" (06/13/2012)  . A-fib   . 2Nd degree atrioventricular block     Current Outpatient Prescriptions  Medication Sig Dispense Refill  . ACCU-CHEK AVIVA PLUS test strip 2 (two) times daily.      Marland Kitchen ACCU-CHEK FASTCLIX LANCETS MISC 2 (two) times daily.      Marland Kitchen acetaminophen-codeine (TYLENOL #3) 300-30 MG per tablet Take 1 tablet by mouth 2 (two) times daily as needed for pain.      Marland Kitchen aspirin 81 MG chewable tablet Chew 81 mg by mouth daily.      . feeding supplement (ENSURE COMPLETE) LIQD Take 237 mLs by mouth daily.      . folic acid (FOLVITE) 1 MG tablet Take 1 mg by mouth daily.      Marland Kitchen gabapentin (NEURONTIN) 100 MG capsule Take 100 mg by mouth 2 (two) times daily.       . isosorbide mononitrate (IMDUR) 30 MG 24 hr tablet Take 0.5 tablets (  15 mg total) by mouth daily.  15 tablet  11  . ketoconazole (NIZORAL) 2 % cream Apply 1 application topically daily.      Marland Kitchen levothyroxine (SYNTHROID, LEVOTHROID) 50 MCG tablet Take 50 mcg by mouth daily.      Marland Kitchen loratadine (CLARITIN) 10 MG tablet Take 10 mg by mouth daily. For allergies      . metoprolol tartrate (LOPRESSOR) 25 MG tablet Take 12.5 mg by mouth 2 (two) times daily.      . nitroGLYCERIN (NITRODUR - DOSED IN MG/24 HR) 0.2 mg/hr Place 1 patch (0.2 mg total) onto the skin daily as needed (chest pain).  30 patch  0  . pantoprazole (PROTONIX) 40 MG tablet Take 1 tablet (40 mg total) by mouth 2 (two) times daily before a meal.  60 tablet  1  . rosuvastatin (CRESTOR) 10 MG tablet Take 10 mg by mouth daily.      . Vitamin D, Ergocalciferol, (DRISDOL) 50000 UNITS  CAPS capsule Take 1 capsule (50,000 Units total) by mouth every 7 (seven) days. Mondays  4 capsule  5  . warfarin (COUMADIN) 5 MG tablet Take 1 tablet by mouth once daily or as directed  30 tablet  6  . zolpidem (AMBIEN) 10 MG tablet Take 5 mg by mouth at bedtime as needed. sleep       No current facility-administered medications for this visit.    Allergies:    Allergies  Allergen Reactions  . Darvon   . Digoxin And Related Nausea Only  . Nitrostat [Nitroglycerin]   . Other     Tylenol #3  . Penicillins Other (See Comments)    Was told had allergy from childhood... Unknown reaction  . Percocet [Oxycodone-Acetaminophen]   . Percodan [Oxycodone-Aspirin]   . Vicodin [Hydrocodone-Acetaminophen]     Social History:  The patient  reports that she has never smoked. She has never used smokeless tobacco. She reports that she does not drink alcohol or use illicit drugs.   Family history:   Family History  Problem Relation Age of Onset  . Breast cancer Daughter   . Diabetes Son   . Diabetes Daughter   . Heart disease Father   . Diabetes Father   . Colon polyps Daughter   . Heart attack Brother   . Diabetes Brother   . Stroke Sister     ROS:  Please see the history of present illness.  All other systems reviewed and negative.   PHYSICAL EXAM: VS:  BP 130/80  Pulse 72  Ht 5\' 4"  (1.626 m) Well nourished, well developed, in no acute distress HEENT: Pupils are equal round react to light accommodation extraocular movements are intact.  Neck: no JVDNo cervical lymphadenopathy. Cardiac: Regular rate and rhythm without murmurs rubs or gallops. M/S:  Chest wall is tender to palpation above or below the left breast. No signs of infection or edema. Lungs:  clear to auscultation bilaterally, no wheezing, rhonchi or rales Abd: soft, nontender, positive bowel sounds all quadrants, no hepatosplenomegaly Ext: 1+ lower extremity edema.  1 + radial and dorsalis pedis pulses. Skin: warm and  dry Neuro:  Grossly normal   ASSESSMENT AND PLAN:  Problem List Items Addressed This Visit   Musculoskeletal chest pain - Primary     The patient's CP is clearly reproducible with even light palpation.  No edema or signs of infection.  She is already on Tylenol 3 for pain.  She had and EGD in Dec 2013 which showed a  gastric ulcer.   I would avoid NSAIDs.  I did discuss some natural antiinflammatory foods that would be beneficial.  She will follow up with PCP if pain continues.

## 2012-11-01 NOTE — Assessment & Plan Note (Signed)
The patient's CP is clearly reproducible with even light palpation.  No edema or signs of infection.  She is already on Tylenol 3 for pain.  She had and EGD in Dec 2013 which showed a gastric ulcer.   I would avoid NSAIDs.  I did discuss some natural antiinflammatory foods that would be beneficial.  She will follow up with PCP if pain continues.

## 2012-11-01 NOTE — Patient Instructions (Signed)
Continue with Tylenol 3.  Follow up with PCP if pain continues.  Follow up with Dr. Royann Shivers as previously scheduled.

## 2012-11-01 NOTE — Telephone Encounter (Signed)
Spoke to [place]. She was not at the residents with her mother.She states Dewayne Hatch is with the patient. Called and spoke to  [place].  Dewayne Hatch states when she was assisting the patient out of the bath ,patient complained of pain under her left breast and swelling around pacer site. Ann states area is warm to the touch. Appointment schedule today 11:20 am with Cyndia Bent is aware and will have one of the patient 's son to bring her

## 2012-11-01 NOTE — Telephone Encounter (Signed)
Pacemaker are is swollen and having some chest pains.

## 2012-11-07 ENCOUNTER — Other Ambulatory Visit: Payer: Self-pay | Admitting: *Deleted

## 2012-11-07 ENCOUNTER — Telehealth: Payer: Self-pay | Admitting: Endocrinology

## 2012-11-07 MED ORDER — LEVOTHYROXINE SODIUM 50 MCG PO TABS: 50.0000 ug | ORAL_TABLET | Freq: Every day | ORAL | Status: DC

## 2012-11-15 ENCOUNTER — Other Ambulatory Visit: Payer: Self-pay | Admitting: *Deleted

## 2012-11-15 MED ORDER — GLUCOSE BLOOD VI STRP: ORAL_STRIP | Status: DC

## 2012-11-15 MED ORDER — LEVOTHYROXINE SODIUM 50 MCG PO TABS: 50.0000 ug | ORAL_TABLET | Freq: Every day | ORAL | Status: DC

## 2012-11-18 ENCOUNTER — Telehealth: Payer: Self-pay | Admitting: Cardiovascular Disease

## 2012-11-18 ENCOUNTER — Other Ambulatory Visit: Payer: Self-pay | Admitting: *Deleted

## 2012-11-18 ENCOUNTER — Inpatient Hospital Stay (HOSPITAL_COMMUNITY)
Admission: EM | Admit: 2012-11-18 | Discharge: 2012-11-21 | DRG: 872 | Disposition: A | Discharge status: Home / Self Care | Payer: Medicare Other | Attending: Internal Medicine | Admitting: Internal Medicine

## 2012-11-18 ENCOUNTER — Emergency Department (HOSPITAL_COMMUNITY): Payer: Medicare Other

## 2012-11-18 ENCOUNTER — Encounter (HOSPITAL_COMMUNITY): Payer: Self-pay | Admitting: Emergency Medicine

## 2012-11-18 DIAGNOSIS — Z86718 Personal history of other venous thrombosis and embolism: Secondary | ICD-10-CM | POA: Diagnosis present

## 2012-11-18 DIAGNOSIS — J029 Acute pharyngitis, unspecified: Secondary | ICD-10-CM | POA: Diagnosis present

## 2012-11-18 DIAGNOSIS — R059 Cough, unspecified: Secondary | ICD-10-CM | POA: Diagnosis present

## 2012-11-18 DIAGNOSIS — Z791 Long term (current) use of non-steroidal anti-inflammatories (NSAID): Secondary | ICD-10-CM

## 2012-11-18 DIAGNOSIS — R509 Fever, unspecified: Secondary | ICD-10-CM | POA: Diagnosis present

## 2012-11-18 DIAGNOSIS — I2510 Atherosclerotic heart disease of native coronary artery without angina pectoris: Secondary | ICD-10-CM | POA: Diagnosis present

## 2012-11-18 DIAGNOSIS — Z951 Presence of aortocoronary bypass graft: Secondary | ICD-10-CM

## 2012-11-18 DIAGNOSIS — I10 Essential (primary) hypertension: Secondary | ICD-10-CM

## 2012-11-18 DIAGNOSIS — N179 Acute kidney failure, unspecified: Secondary | ICD-10-CM | POA: Diagnosis present

## 2012-11-18 DIAGNOSIS — I82729 Chronic embolism and thrombosis of deep veins of unspecified upper extremity: Secondary | ICD-10-CM | POA: Diagnosis present

## 2012-11-18 DIAGNOSIS — E119 Type 2 diabetes mellitus without complications: Secondary | ICD-10-CM

## 2012-11-18 DIAGNOSIS — I82622 Acute embolism and thrombosis of deep veins of left upper extremity: Secondary | ICD-10-CM

## 2012-11-18 DIAGNOSIS — N189 Chronic kidney disease, unspecified: Secondary | ICD-10-CM | POA: Diagnosis present

## 2012-11-18 DIAGNOSIS — Z950 Presence of cardiac pacemaker: Secondary | ICD-10-CM

## 2012-11-18 DIAGNOSIS — A419 Sepsis, unspecified organism: Principal | ICD-10-CM | POA: Diagnosis present

## 2012-11-18 DIAGNOSIS — I5032 Chronic diastolic (congestive) heart failure: Secondary | ICD-10-CM | POA: Diagnosis present

## 2012-11-18 DIAGNOSIS — M129 Arthropathy, unspecified: Secondary | ICD-10-CM | POA: Diagnosis present

## 2012-11-18 DIAGNOSIS — K219 Gastro-esophageal reflux disease without esophagitis: Secondary | ICD-10-CM | POA: Diagnosis present

## 2012-11-18 DIAGNOSIS — I252 Old myocardial infarction: Secondary | ICD-10-CM

## 2012-11-18 DIAGNOSIS — N390 Urinary tract infection, site not specified: Secondary | ICD-10-CM | POA: Diagnosis present

## 2012-11-18 DIAGNOSIS — Z880 Allergy status to penicillin: Secondary | ICD-10-CM

## 2012-11-18 DIAGNOSIS — Z7982 Long term (current) use of aspirin: Secondary | ICD-10-CM

## 2012-11-18 DIAGNOSIS — I4891 Unspecified atrial fibrillation: Secondary | ICD-10-CM | POA: Diagnosis present

## 2012-11-18 DIAGNOSIS — I959 Hypotension, unspecified: Secondary | ICD-10-CM | POA: Diagnosis present

## 2012-11-18 DIAGNOSIS — R112 Nausea with vomiting, unspecified: Secondary | ICD-10-CM | POA: Diagnosis present

## 2012-11-18 DIAGNOSIS — B9689 Other specified bacterial agents as the cause of diseases classified elsewhere: Secondary | ICD-10-CM

## 2012-11-18 DIAGNOSIS — Z7901 Long term (current) use of anticoagulants: Secondary | ICD-10-CM

## 2012-11-18 DIAGNOSIS — Z8249 Family history of ischemic heart disease and other diseases of the circulatory system: Secondary | ICD-10-CM

## 2012-11-18 DIAGNOSIS — I509 Heart failure, unspecified: Secondary | ICD-10-CM | POA: Diagnosis present

## 2012-11-18 DIAGNOSIS — K449 Diaphragmatic hernia without obstruction or gangrene: Secondary | ICD-10-CM | POA: Diagnosis present

## 2012-11-18 DIAGNOSIS — K3189 Other diseases of stomach and duodenum: Secondary | ICD-10-CM | POA: Diagnosis present

## 2012-11-18 DIAGNOSIS — E039 Hypothyroidism, unspecified: Secondary | ICD-10-CM | POA: Diagnosis present

## 2012-11-18 DIAGNOSIS — A499 Bacterial infection, unspecified: Secondary | ICD-10-CM

## 2012-11-18 DIAGNOSIS — I129 Hypertensive chronic kidney disease with stage 1 through stage 4 chronic kidney disease, or unspecified chronic kidney disease: Secondary | ICD-10-CM | POA: Diagnosis present

## 2012-11-18 DIAGNOSIS — E7800 Pure hypercholesterolemia, unspecified: Secondary | ICD-10-CM | POA: Diagnosis present

## 2012-11-18 DIAGNOSIS — I480 Paroxysmal atrial fibrillation: Secondary | ICD-10-CM | POA: Diagnosis present

## 2012-11-18 DIAGNOSIS — H9190 Unspecified hearing loss, unspecified ear: Secondary | ICD-10-CM | POA: Diagnosis present

## 2012-11-18 DIAGNOSIS — R05 Cough: Secondary | ICD-10-CM | POA: Diagnosis present

## 2012-11-18 DIAGNOSIS — Z993 Dependence on wheelchair: Secondary | ICD-10-CM

## 2012-11-18 DIAGNOSIS — H548 Legal blindness, as defined in USA: Secondary | ICD-10-CM | POA: Diagnosis present

## 2012-11-18 DIAGNOSIS — Z79899 Other long term (current) drug therapy: Secondary | ICD-10-CM

## 2012-11-18 LAB — URINALYSIS, ROUTINE W REFLEX MICROSCOPIC
Bilirubin Urine: NEGATIVE
Glucose, UA: 100 mg/dL — AB
Hgb urine dipstick: NEGATIVE
Ketones, ur: NEGATIVE mg/dL
Nitrite: NEGATIVE
Protein, ur: 30 mg/dL — AB
pH: 7 (ref 5.0–8.0)

## 2012-11-18 LAB — CBC WITH DIFFERENTIAL/PLATELET
Basophils Absolute: 0 10*3/uL (ref 0.0–0.1)
Basophils Relative: 0 % (ref 0–1)
Eosinophils Absolute: 0 10*3/uL (ref 0.0–0.7)
Eosinophils Relative: 0 % (ref 0–5)
HCT: 40.9 % (ref 36.0–46.0)
Lymphocytes Relative: 3 % — ABNORMAL LOW (ref 12–46)
MCH: 29.8 pg (ref 26.0–34.0)
MCHC: 33 g/dL (ref 30.0–36.0)
MCV: 90.3 fL (ref 78.0–100.0)
Monocytes Absolute: 1 10*3/uL (ref 0.1–1.0)
Monocytes Relative: 6 % (ref 3–12)
RDW: 13.3 % (ref 11.5–15.5)

## 2012-11-18 LAB — COMPREHENSIVE METABOLIC PANEL
AST: 19 U/L (ref 0–37)
Albumin: 3.5 g/dL (ref 3.5–5.2)
BUN: 18 mg/dL (ref 6–23)
CO2: 27 mEq/L (ref 19–32)
Chloride: 98 mEq/L (ref 96–112)
Creatinine, Ser: 1.37 mg/dL — ABNORMAL HIGH (ref 0.50–1.10)
GFR calc non Af Amer: 33 mL/min — ABNORMAL LOW (ref >90)
Total Bilirubin: 0.9 mg/dL (ref 0.3–1.2)

## 2012-11-18 LAB — PROTIME-INR: INR: 2.59 — ABNORMAL HIGH (ref 0.00–1.49)

## 2012-11-18 LAB — GLUCOSE, CAPILLARY: Glucose-Capillary: 175 mg/dL — ABNORMAL HIGH (ref 70–99)

## 2012-11-18 LAB — CG4 I-STAT (LACTIC ACID): Lactic Acid, Venous: 2.08 mmol/L (ref 0.5–2.2)

## 2012-11-18 LAB — URINE MICROSCOPIC-ADD ON

## 2012-11-18 MED ORDER — ONDANSETRON HCL 4 MG PO TABS: 4.0000 mg | ORAL_TABLET | Freq: Four times a day (QID) | ORAL | Status: DC | PRN

## 2012-11-18 MED ORDER — PANTOPRAZOLE SODIUM 40 MG PO TBEC
40.0000 mg | DELAYED_RELEASE_TABLET | Freq: Two times a day (BID) | ORAL | Status: DC
  Administered 2012-11-18 – 2012-11-21 (×6): 40 mg via ORAL
  Filled 2012-11-18 (×7): qty 1

## 2012-11-18 MED ORDER — WARFARIN VIDEO: Freq: Once | Status: DC

## 2012-11-18 MED ORDER — ACETAMINOPHEN 325 MG PO TABS
650.0000 mg | ORAL_TABLET | Freq: Four times a day (QID) | ORAL | Status: DC | PRN
  Administered 2012-11-19: 650 mg via ORAL
  Filled 2012-11-18 (×2): qty 2

## 2012-11-18 MED ORDER — WARFARIN SODIUM 5 MG PO TABS
5.0000 mg | ORAL_TABLET | Freq: Once | ORAL | Status: AC
  Administered 2012-11-18: 5 mg via ORAL
  Filled 2012-11-18: qty 1

## 2012-11-18 MED ORDER — SENNA 8.6 MG PO TABS
1.0000 | ORAL_TABLET | Freq: Two times a day (BID) | ORAL | Status: DC
  Administered 2012-11-18 – 2012-11-21 (×6): 8.6 mg via ORAL
  Filled 2012-11-18 (×6): qty 1

## 2012-11-18 MED ORDER — ALBUTEROL SULFATE (5 MG/ML) 0.5% IN NEBU: 2.5000 mg | INHALATION_SOLUTION | RESPIRATORY_TRACT | Status: DC | PRN

## 2012-11-18 MED ORDER — IOHEXOL 300 MG/ML  SOLN: 80.0000 mL | Freq: Once | INTRAMUSCULAR | Status: AC | PRN

## 2012-11-18 MED ORDER — ZOLPIDEM TARTRATE 5 MG PO TABS
5.0000 mg | ORAL_TABLET | Freq: Every evening | ORAL | Status: DC | PRN
  Administered 2012-11-20: 5 mg via ORAL
  Filled 2012-11-18: qty 1

## 2012-11-18 MED ORDER — ACETAMINOPHEN 650 MG RE SUPP: 650.0000 mg | Freq: Four times a day (QID) | RECTAL | Status: DC | PRN

## 2012-11-18 MED ORDER — GUAIFENESIN ER 600 MG PO TB12
600.0000 mg | ORAL_TABLET | Freq: Two times a day (BID) | ORAL | Status: DC
  Administered 2012-11-18 – 2012-11-21 (×6): 600 mg via ORAL
  Filled 2012-11-18 (×7): qty 1

## 2012-11-18 MED ORDER — VANCOMYCIN HCL IN DEXTROSE 1-5 GM/200ML-% IV SOLN
1000.0000 mg | INTRAVENOUS | Status: DC
  Administered 2012-11-19 – 2012-11-20 (×2): 1000 mg via INTRAVENOUS
  Filled 2012-11-18 (×2): qty 200

## 2012-11-18 MED ORDER — HYDROCODONE-ACETAMINOPHEN 5-325 MG PO TABS: 1.0000 | ORAL_TABLET | ORAL | Status: DC | PRN

## 2012-11-18 MED ORDER — SODIUM CHLORIDE 0.9 % IJ SOLN
3.0000 mL | Freq: Two times a day (BID) | INTRAMUSCULAR | Status: DC
  Administered 2012-11-19 – 2012-11-21 (×3): 3 mL via INTRAVENOUS

## 2012-11-18 MED ORDER — VANCOMYCIN HCL IN DEXTROSE 1-5 GM/200ML-% IV SOLN
1000.0000 mg | INTRAVENOUS | Status: AC
  Administered 2012-11-18: 1000 mg via INTRAVENOUS
  Filled 2012-11-18: qty 200

## 2012-11-18 MED ORDER — SODIUM CHLORIDE 0.9 % IV SOLN
INTRAVENOUS | Status: AC
  Administered 2012-11-18: 22:00:00 via INTRAVENOUS

## 2012-11-18 MED ORDER — ACCU-CHEK AVIVA PLUS W/DEVICE KIT: 1.0000 | PACK | Status: DC | PRN

## 2012-11-18 MED ORDER — ACETAMINOPHEN-CODEINE #3 300-30 MG PO TABS
1.0000 | ORAL_TABLET | Freq: Two times a day (BID) | ORAL | Status: DC | PRN
  Administered 2012-11-19 – 2012-11-20 (×3): 1 via ORAL
  Filled 2012-11-18 (×3): qty 1

## 2012-11-18 MED ORDER — IOHEXOL 300 MG/ML  SOLN
50.0000 mL | Freq: Once | INTRAMUSCULAR | Status: AC | PRN
  Administered 2012-11-18: 80 mL via INTRAVENOUS

## 2012-11-18 MED ORDER — SODIUM CHLORIDE 0.9 % IV BOLUS (SEPSIS)
1000.0000 mL | Freq: Once | INTRAVENOUS | Status: AC
  Administered 2012-11-18: 1000 mL via INTRAVENOUS

## 2012-11-18 MED ORDER — DOCUSATE SODIUM 100 MG PO CAPS
100.0000 mg | ORAL_CAPSULE | Freq: Two times a day (BID) | ORAL | Status: DC
  Administered 2012-11-18 – 2012-11-21 (×6): 100 mg via ORAL
  Filled 2012-11-18 (×7): qty 1

## 2012-11-18 MED ORDER — LEVOTHYROXINE SODIUM 50 MCG PO TABS
50.0000 ug | ORAL_TABLET | Freq: Every day | ORAL | Status: DC
  Administered 2012-11-19 – 2012-11-21 (×3): 50 ug via ORAL
  Filled 2012-11-18 (×4): qty 1

## 2012-11-18 MED ORDER — WARFARIN - PHARMACIST DOSING INPATIENT: Freq: Every day | Status: DC

## 2012-11-18 MED ORDER — ONDANSETRON HCL 4 MG/2ML IJ SOLN: 4.0000 mg | Freq: Four times a day (QID) | INTRAMUSCULAR | Status: DC | PRN

## 2012-11-18 MED ORDER — ATORVASTATIN CALCIUM 20 MG PO TABS
20.0000 mg | ORAL_TABLET | Freq: Every day | ORAL | Status: DC
  Administered 2012-11-19 – 2012-11-21 (×3): 20 mg via ORAL
  Filled 2012-11-18 (×3): qty 1

## 2012-11-18 MED ORDER — INSULIN ASPART 100 UNIT/ML ~~LOC~~ SOLN
0.0000 [IU] | Freq: Three times a day (TID) | SUBCUTANEOUS | Status: DC
  Administered 2012-11-19 – 2012-11-20 (×2): 2 [IU] via SUBCUTANEOUS
  Administered 2012-11-20: 3 [IU] via SUBCUTANEOUS
  Administered 2012-11-21 (×2): 2 [IU] via SUBCUTANEOUS

## 2012-11-18 MED ORDER — ACETAMINOPHEN 325 MG PO TABS
650.0000 mg | ORAL_TABLET | Freq: Once | ORAL | Status: AC
  Administered 2012-11-18: 650 mg via ORAL
  Filled 2012-11-18: qty 2

## 2012-11-18 MED ORDER — POLYETHYLENE GLYCOL 3350 17 G PO PACK
17.0000 g | PACK | Freq: Every day | ORAL | Status: DC | PRN
  Filled 2012-11-18: qty 1

## 2012-11-18 MED ORDER — BISACODYL 10 MG RE SUPP: 10.0000 mg | Freq: Every day | RECTAL | Status: DC | PRN

## 2012-11-18 MED ORDER — INSULIN ASPART 100 UNIT/ML ~~LOC~~ SOLN: 0.0000 [IU] | Freq: Every day | SUBCUTANEOUS | Status: DC

## 2012-11-18 MED ORDER — PIPERACILLIN-TAZOBACTAM 3.375 G IVPB
3.3750 g | Freq: Three times a day (TID) | INTRAVENOUS | Status: DC
  Administered 2012-11-18 – 2012-11-21 (×8): 3.375 g via INTRAVENOUS
  Filled 2012-11-18 (×10): qty 50

## 2012-11-18 MED ORDER — DEXTROSE 5 % IV SOLN
1.0000 g | Freq: Two times a day (BID) | INTRAVENOUS | Status: DC
  Filled 2012-11-18 (×2): qty 1

## 2012-11-18 MED ORDER — FLEET ENEMA 7-19 GM/118ML RE ENEM: 1.0000 | ENEMA | Freq: Once | RECTAL | Status: AC | PRN

## 2012-11-18 MED ORDER — ONDANSETRON HCL 4 MG/2ML IJ SOLN: 4.0000 mg | Freq: Three times a day (TID) | INTRAMUSCULAR | Status: AC | PRN

## 2012-11-18 MED ORDER — ASPIRIN 81 MG PO CHEW
81.0000 mg | CHEWABLE_TABLET | Freq: Every day | ORAL | Status: DC
  Administered 2012-11-18 – 2012-11-21 (×4): 81 mg via ORAL
  Filled 2012-11-18 (×4): qty 1

## 2012-11-18 MED ORDER — COUMADIN BOOK
Freq: Once | Status: AC
  Administered 2012-11-19: 10:00:00
  Filled 2012-11-18: qty 1

## 2012-11-18 MED ORDER — GABAPENTIN 100 MG PO CAPS
100.0000 mg | ORAL_CAPSULE | Freq: Two times a day (BID) | ORAL | Status: DC
  Administered 2012-11-18 – 2012-11-21 (×6): 100 mg via ORAL
  Filled 2012-11-18 (×7): qty 1

## 2012-11-18 MED ORDER — METOPROLOL TARTRATE 12.5 MG HALF TABLET
12.5000 mg | ORAL_TABLET | Freq: Two times a day (BID) | ORAL | Status: DC
  Administered 2012-11-19 – 2012-11-21 (×5): 12.5 mg via ORAL
  Filled 2012-11-18 (×7): qty 1

## 2012-11-18 NOTE — ED Provider Notes (Signed)
CSN: [id number]     Arrival date & time 11/18/12  1138 History   First MD Initiated Contact with Patient 11/18/12 1212     Chief Complaint  Patient presents with  . Weakness  . Fever   (Consider location/radiation/quality/duration/timing/severity/associated sxs/prior Treatment) Patient is a 79 y.o. female presenting with weakness and fever.  Weakness  Fever  Pt with numerous medical problems brought to the ED by family after she was found to be febrile this morning. Woke with chills and temp rose over the next several hours. She has had general weakness and cough productive of yellow sputum and pain in L sided chest and abdomen. Denies vomiting or diarrhea, has had increased urinary frequency but no dysuria or odor.   Past Medical History  Diagnosis Date  . Hypertension   . Coronary artery disease   . Legally blind   . Chronic kidney disease   . Anemia   . Hypothyroid   . Gastric ulcer   . Hiatal hernia   . Dyspepsia   . UTI (lower urinary tract infection)   . H/O: GI bleed 12//13  . Diastolic dysfunction   . Pacemaker   . High cholesterol   . CHF (congestive heart failure)   . Anginal pain   . MI (myocardial infarction) ? 1994  . DVT of upper extremity (deep vein thrombosis) 06/13/2012    BUE  . Seasonal allergies   . Allergy to perfume     "strong perfumes only" (06/13/2012)  . Shortness of breath     "can happen at any time" (06/13/2012)  . Type II diabetes mellitus   . History of blood transfusion 2013    "not related to OR; BP had dropped" (06/13/2012)  . GERD (gastroesophageal reflux disease)   . Daily headache   . Arthritis     "all over" (06/13/2012)  . A-fib   . 2Nd degree atrioventricular block    Past Surgical History  Procedure Laterality Date  . Esophagogastroduodenoscopy  12/22/2011    Procedure: ESOPHAGOGASTRODUODENOSCOPY (EGD);  Surgeon: Iva Boop, MD;  Location: [facility];  Service: Endoscopy;  Laterality: N/A;  . Cardioversion  06/06/06   successful  . Insert / replace / remove pacemaker  06/29/2006    Medtronic adapta  . Cardiac catheterization  10/25/92  . Cardiac catheterization  11/18/03    w/grafts 100%CX LAD 80 & 100%  . Cardiac catheterization  01/24/05    diffuse disease of native vessels  . Cardiac catheterization  06/06/06    severe native CAD  . Coronary artery bypass graft  10/27/92    LIMA to LAD,SVG to LAD second diagonal,obtuse maraginal of the CX and posterior descendingbranch of the RCA  . Cataract extraction w/ intraocular lens  implant, bilateral Bilateral   . Refractive surgery Bilateral    Family History  Problem Relation Age of Onset  . Breast cancer Daughter   . Diabetes Son   . Diabetes Daughter   . Heart disease Father   . Diabetes Father   . Colon polyps Daughter   . Heart attack Brother   . Diabetes Brother   . Stroke Sister    History  Substance Use Topics  . Smoking status: Never Smoker   . Smokeless tobacco: Never Used  . Alcohol Use: No   OB History   Grav Para Term Preterm Abortions TAB SAB Ect Mult Living                 Review of Systems  Constitutional: Positive for fever.  Neurological: Positive for weakness.   All other systems reviewed and are negative except as noted in HPI.    Allergies  Darvon; Digoxin and related; Nitrostat; Other; Penicillins; Percocet; Percodan; and Vicodin  Home Medications   Current Outpatient Rx  Name  Route  Sig  Dispense  Refill  . acetaminophen-codeine (TYLENOL #3) 300-30 MG per tablet   Oral   Take 1 tablet by mouth 2 (two) times daily as needed for pain.         Marland Kitchen aspirin 81 MG chewable tablet   Oral   Chew 81 mg by mouth daily.         . feeding supplement, GLUCERNA SHAKE, (GLUCERNA SHAKE) LIQD   Oral   Take 237 mLs by mouth 2 (two) times daily between meals.         . folic acid (FOLVITE) 1 MG tablet   Oral   Take 1 mg by mouth daily.         Marland Kitchen gabapentin (NEURONTIN) 100 MG capsule   Oral   Take 100 mg by  mouth 2 (two) times daily.          . isosorbide mononitrate (IMDUR) 30 MG 24 hr tablet   Oral   Take 15 mg by mouth daily.         Marland Kitchen levothyroxine (SYNTHROID, LEVOTHROID) 50 MCG tablet   Oral   Take 50 mcg by mouth daily before breakfast.         . loratadine (CLARITIN) 10 MG tablet   Oral   Take 10 mg by mouth daily. For allergies         . metoprolol tartrate (LOPRESSOR) 25 MG tablet   Oral   Take 12.5 mg by mouth 2 (two) times daily.         . nitroGLYCERIN (NITRODUR - DOSED IN MG/24 HR) 0.2 mg/hr patch   Transdermal   Place 1 patch onto the skin daily as needed. Pt does not wear all the time         . pantoprazole (PROTONIX) 40 MG tablet   Oral   Take 40 mg by mouth 2 (two) times daily.         . rosuvastatin (CRESTOR) 10 MG tablet   Oral   Take 10 mg by mouth daily.         . Vitamin D, Ergocalciferol, (DRISDOL) 50000 UNITS CAPS capsule   Oral   Take 50,000 Units by mouth every Monday.         . warfarin (COUMADIN) 5 MG tablet   Oral   Take 2.5-5 mg by mouth daily. 5 mg tablet Monday, Wednesday, Friday and Saturday. Tuesday, Thursday and Sunday 2.5 mg         . zolpidem (AMBIEN) 10 MG tablet   Oral   Take 5 mg by mouth at bedtime as needed. sleep         . ACCU-CHEK FASTCLIX LANCETS MISC      2 (two) times daily.         . Blood Glucose Monitoring Suppl (ACCU-CHEK AVIVA PLUS) W/DEVICE KIT   Does not apply   1 each by Does not apply route as needed.   1 kit   0   . glucose blood (ACCU-CHEK AVIVA PLUS) test strip      Use to check blood sugars 2 times daily dx code 250.00   200 each   3    BP 110/69  Pulse 91  Temp(Src) 102.8 F (39.3 C) (Oral)  Resp 20  SpO2 100% Physical Exam  Nursing note and vitals reviewed. Constitutional: She is oriented to person, place, and time. She appears well-developed and well-nourished.  HENT:  Head: Normocephalic and atraumatic.  Eyes: EOM are normal. Pupils are equal, round, and reactive  to light.  Neck: Normal range of motion. Neck supple.  Cardiovascular: Normal rate, normal heart sounds and intact distal pulses.   Pulmonary/Chest: Effort normal and breath sounds normal.  Abdominal: Bowel sounds are normal. She exhibits no distension. There is tenderness (diffuse, mostly suprapubic and LUQ). There is no rebound and no guarding.  Musculoskeletal: Normal range of motion. She exhibits no edema and no tenderness.  Neurological: She is alert and oriented to person, place, and time. She has normal strength. No cranial nerve deficit or sensory deficit.  Skin: Skin is warm and dry. No rash noted.  Psychiatric: She has a normal mood and affect.    ED Course  Procedures (including critical care time) Labs Review Labs Reviewed  CBC WITH DIFFERENTIAL - Abnormal; Notable for the following:    WBC 16.8 (*)    Platelets 142 (*)    Neutrophils Relative % 91 (*)    Neutro Abs 15.2 (*)    Lymphocytes Relative 3 (*)    Lymphs Abs 0.6 (*)    All other components within normal limits  COMPREHENSIVE METABOLIC PANEL - Abnormal; Notable for the following:    Glucose, Bld 218 (*)    Creatinine, Ser 1.37 (*)    GFR calc non Af Amer 33 (*)    GFR calc Af Amer 38 (*)    All other components within normal limits  URINALYSIS, ROUTINE W REFLEX MICROSCOPIC  CG4 I-STAT (LACTIC ACID)   Imaging Review Dg Chest 2 View  11/18/2012   CLINICAL DATA:  Fever and vomiting.  EXAM: CHEST  2 VIEW  COMPARISON:  10/28/2012  FINDINGS: Patient is rotated to the left. Left-sided pacemaker and sternotomy wires are unchanged. Lungs are adequately inflated the without consolidation or effusion. There is mild stable cardiomegaly. Remainder of the exam is unchanged.  IMPRESSION: No active cardiopulmonary disease.  Mild stable cardiomegaly.   Electronically Signed   By: Elberta Fortis M.D.   On: 11/18/2012 13:32    EKG Interpretation   None       MDM  No diagnosis found.  Labs and CXR reviewed, no definite  cause of fever. Lactate normal, but BP lower now. IVF bolus ordered. CT abd pending to eval for diverticulitis. Care signed out to Dr. Rhunette Croft at shift change.     Charles B. Bernette Mayers, MD 11/18/12 [phone]

## 2012-11-18 NOTE — Telephone Encounter (Signed)
Just an FYI that [name] is in [facility].

## 2012-11-18 NOTE — ED Provider Notes (Signed)
  Physical Exam  BP 97/54  Pulse 85  Temp(Src) 99.1 F (37.3 C) (Oral)  Resp 20  SpO2 100%  Physical Exam  ED Course  Procedures  MDM  Assuming care of patient this evening. Patient in the ED for fever and weakness. Has no source of infection per hx and exam - awaiting abdomen CT at sign out - which is negative.  Workup thus far is negative as well.  Patient had no complains, no concerns from the nursing side. Will continue to monitor. Vanc and Cefepime started, patient had blood culture and urine culture sent.  BP are labile, some pressure in the 70s and 80s, however, no AMS, and lactate is normal.  Will admit to Hospitalist, step down.   Derwood Kaplan, MD 11/18/12 1928

## 2012-11-18 NOTE — ED Notes (Signed)
Bed: WA02 Expected date:  Expected time:  Means of arrival:  Comments: ems- 79 yo, fever, pain in chest x2-3 days, pain upon palpation

## 2012-11-18 NOTE — Progress Notes (Signed)
ANTIBIOTIC CONSULT NOTE - INITIAL  Pharmacy Consult for Vancomycin Indication: sepsi  Allergies  Allergen Reactions  . Darvon   . Digoxin And Related Nausea Only  . Nitrostat [Nitroglycerin]   . Penicillins Other (See Comments)    Was told had allergy from childhood... Unknown reaction  . Percocet [Oxycodone-Acetaminophen]     confused  . Percodan [Oxycodone-Aspirin]   . Vicodin [Hydrocodone-Acetaminophen]     Patient Measurements: Height: 5' 4.17" (163 cm) (Documented 11/01/12) Weight: 198 lb 13.7 oz (90.2 kg) IBW/kg (Calculated) : 55.1 Adjusted Body Weight:   Vital Signs: Temp: 98.2 F (36.8 C) (11/03 2100) Temp src: Oral (11/03 2100) BP: 105/54 mmHg (11/03 2100) Pulse Rate: 70 (11/03 2100) Intake/Output from previous day:   Intake/Output from this shift:    Labs:  Recent Labs  11/18/12 1240  WBC 16.8*  HGB 13.5  PLT 142*  CREATININE 1.37*   Estimated Creatinine Clearance: 29.8 ml/min (by C-G formula based on Cr of 1.37). No results found for this basename: VANCOTROUGH, VANCOPEAK, VANCORANDOM, GENTTROUGH, GENTPEAK, GENTRANDOM, TOBRATROUGH, TOBRAPEAK, TOBRARND, AMIKACINPEAK, AMIKACINTROU, AMIKACIN,  in the last 72 hours   Microbiology: No results found for this or any previous visit (from the past 720 hour(s)).  Medical History: Past Medical History  Diagnosis Date  . Hypertension   . Coronary artery disease   . Legally blind   . Chronic kidney disease   . Anemia   . Hypothyroid   . Gastric ulcer   . Hiatal hernia   . Dyspepsia   . UTI (lower urinary tract infection)   . H/O: GI bleed 12//13  . Diastolic dysfunction   . Pacemaker   . High cholesterol   . CHF (congestive heart failure)   . Anginal pain   . MI (myocardial infarction) ? 1994  . DVT of upper extremity (deep vein thrombosis) 06/13/2012    BUE  . Seasonal allergies   . Allergy to perfume     "strong perfumes only" (06/13/2012)  . Shortness of breath     "can happen at any time"  (06/13/2012)  . Type II diabetes mellitus   . History of blood transfusion 2013    "not related to OR; BP had dropped" (06/13/2012)  . GERD (gastroesophageal reflux disease)   . Daily headache   . Arthritis     "all over" (06/13/2012)  . A-fib   . 2Nd degree atrioventricular block     Medications:  Scheduled:  . sodium chloride   Intravenous STAT  . aspirin  81 mg Oral Daily  . [START ON 11/19/2012] atorvastatin  20 mg Oral q1800  . docusate sodium  100 mg Oral BID  . gabapentin  100 mg Oral BID  . guaiFENesin  600 mg Oral BID  . [START ON 11/19/2012] insulin aspart  0-15 Units Subcutaneous TID WC  . insulin aspart  0-5 Units Subcutaneous QHS  . [START ON 11/19/2012] levothyroxine  50 mcg Oral QAC breakfast  . metoprolol tartrate  12.5 mg Oral BID  . pantoprazole  40 mg Oral BID  . piperacillin-tazobactam (ZOSYN)  IV  3.375 g Intravenous Q8H  . senna  1 tablet Oral BID  . sodium chloride  3 mL Intravenous Q12H   Infusions:   PRN: acetaminophen, acetaminophen, acetaminophen-codeine, albuterol, bisacodyl, HYDROcodone-acetaminophen, iohexol, ondansetron (ZOFRAN) IV, ondansetron (ZOFRAN) IV, ondansetron, polyethylene glycol, sodium phosphate, zolpidem Assessment: 79 yo admitted with Sepsis. Feeling fatigue and having fever since this AM. She have had increased urination. She has had some vague  abdominal pain.    Goal of Therapy:  Vancomycin trough level 15-20 mcg/ml  Plan:  Vancomycin 1000mg  IV q 24 hours. Measure antibiotic drug levels at steady state Follow up culture results  Loletta Specter 11/18/2012,10:16 PM

## 2012-11-18 NOTE — ED Notes (Signed)
Dr. Adela Glimpse informed Blood Cultures haven't been drawn but antibiotics started d/t delay in obtaining cultures.

## 2012-11-18 NOTE — H&P (Addendum)
PCP:  Willey Blade, MD  Cardiology: Dr. Royann Shivers  Chief Complaint:  Fatigue and fever  HPI: [name] is a 79 y.o. female   has a past medical history of Hypertension; Coronary artery disease; Legally blind; Chronic kidney disease; Anemia; Hypothyroid; Gastric ulcer; Hiatal hernia; Dyspepsia; UTI (lower urinary tract infection); H/O: GI bleed (12//13); Diastolic dysfunction; Pacemaker; High cholesterol; CHF (congestive heart failure); Anginal pain; MI (myocardial infarction) (? 1994); DVT of upper extremity (deep vein thrombosis) (06/13/2012); Seasonal allergies; Allergy to perfume; Shortness of breath; Type II diabetes mellitus; History of blood transfusion (2013); GERD (gastroesophageal reflux disease); Daily headache; Arthritis; A-fib; and 2Nd degree atrioventricular block.   Presented with  Feeling fatigue and having fever since this AM. She have had increased urination. She has had some vague abdominal pain. On Arrival to ED CT of abdomen was unremarkable. She have had a sore throat. She reports hx of chronic cough for over 1 year but nothing new. She have not had much of anything to eat all day and states that her mouth is dry she feels thirsty.  Reports last BM 2 days ago. She has to have a laxative to have a BM. Denies any sick contacts. At home patient is wheelchair bound but able to transfer on her own at baseline. She is hard of hearing but answers questions appropriately. She had had recent recurrent chest pain reproducible palpation over the side of her pacemaker which cardiology deemed to be noncardiac. Patient was noted to be hypotensive upon arrival to the ER she received IV fluids 2 boluses but remained hypotensive systolics at 90. At that point hospitalist was called for admission. Family and patient states they do not wish-year-old measures I do not wish CPR or intubation but they do wish to give pressors if needed   Review of Systems:    Pertinent positives include: Fevers,  chills, fatigue, weight abdominal pain, nausea, vomiting, constipation  Constitutional:  No weight loss, night sweats, loss  HEENT:  No headaches, Difficulty swallowing,Tooth/dental problems,Sore throat,  No sneezing, itching, ear ache, nasal congestion, post nasal drip,  Cardio-vascular:  No chest pain, Orthopnea, PND, anasarca, dizziness, palpitations.no Bilateral lower extremity swelling  GI:  No heartburn, indigestion, diarrhea, change in bowel habits, loss of appetite, melena, blood in stool, hematemesis Resp:  no shortness of breath at rest. No dyspnea on exertion, No excess mucus, no productive cough, No non-productive cough, No coughing up of blood.No change in color of mucus.No wheezing. Skin:  no rash or lesions. No jaundice GU:  no dysuria, change in color of urine, no urgency or frequency. No straining to urinate.  No flank pain.  Musculoskeletal:  No joint pain or no joint swelling. No decreased range of motion. No back pain.  Psych:  No change in mood or affect. No depression or anxiety. No memory loss.  Neuro: no localizing neurological complaints, no tingling, no weakness, no double vision, no gait abnormality, no slurred speech, no confusion  Otherwise ROS are negative except for above, 10 systems were reviewed  Past Medical History: Past Medical History  Diagnosis Date  . Hypertension   . Coronary artery disease   . Legally blind   . Chronic kidney disease   . Anemia   . Hypothyroid   . Gastric ulcer   . Hiatal hernia   . Dyspepsia   . UTI (lower urinary tract infection)   . H/O: GI bleed 12//13  . Diastolic dysfunction   . Pacemaker   . High  cholesterol   . CHF (congestive heart failure)   . Anginal pain   . MI (myocardial infarction) ? 1994  . DVT of upper extremity (deep vein thrombosis) 06/13/2012    BUE  . Seasonal allergies   . Allergy to perfume     "strong perfumes only" (06/13/2012)  . Shortness of breath     "can happen at any time"  (06/13/2012)  . Type II diabetes mellitus   . History of blood transfusion 2013    "not related to OR; BP had dropped" (06/13/2012)  . GERD (gastroesophageal reflux disease)   . Daily headache   . Arthritis     "all over" (06/13/2012)  . A-fib   . 2Nd degree atrioventricular block    Past Surgical History  Procedure Laterality Date  . Esophagogastroduodenoscopy  12/22/2011    Procedure: ESOPHAGOGASTRODUODENOSCOPY (EGD);  Surgeon: Iva Boop, MD;  Location: [facility];  Service: Endoscopy;  Laterality: N/A;  . Cardioversion  06/06/06    successful  . Insert / replace / remove pacemaker  06/29/2006    [employer] adapta  . Cardiac catheterization  10/25/92  . Cardiac catheterization  11/18/03    w/grafts 100%CX LAD 80 & 100%  . Cardiac catheterization  01/24/05    diffuse disease of native vessels  . Cardiac catheterization  06/06/06    severe native CAD  . Coronary artery bypass graft  10/27/92    LIMA to LAD,SVG to LAD second diagonal,obtuse maraginal of the CX and posterior descendingbranch of the RCA  . Cataract extraction w/ intraocular lens  implant, bilateral Bilateral   . Refractive surgery Bilateral      Medications: Prior to Admission medications   Medication Sig Start Date End Date Taking? Authorizing Provider  acetaminophen-codeine (TYLENOL #3) 300-30 MG per tablet Take 1 tablet by mouth 2 (two) times daily as needed for pain.   Yes Historical Provider, MD  aspirin 81 MG chewable tablet Chew 81 mg by mouth daily.   Yes Historical Provider, MD  feeding supplement, GLUCERNA SHAKE, (GLUCERNA SHAKE) LIQD Take 237 mLs by mouth 2 (two) times daily between meals.   Yes Historical Provider, MD  folic acid (FOLVITE) 1 MG tablet Take 1 mg by mouth daily.   Yes Historical Provider, MD  gabapentin (NEURONTIN) 100 MG capsule Take 100 mg by mouth 2 (two) times daily.    Yes Historical Provider, MD  isosorbide mononitrate (IMDUR) 30 MG 24 hr tablet Take 15 mg by mouth daily.   Yes  Historical Provider, MD  levothyroxine (SYNTHROID, LEVOTHROID) 50 MCG tablet Take 50 mcg by mouth daily before breakfast.   Yes Historical Provider, MD  loratadine (CLARITIN) 10 MG tablet Take 10 mg by mouth daily. For allergies   Yes Historical Provider, MD  metoprolol tartrate (LOPRESSOR) 25 MG tablet Take 12.5 mg by mouth 2 (two) times daily.   Yes Historical Provider, MD  nitroGLYCERIN (NITRODUR - DOSED IN MG/24 HR) 0.2 mg/hr patch Place 1 patch onto the skin daily as needed. Pt does not wear all the time   Yes Historical Provider, MD  pantoprazole (PROTONIX) 40 MG tablet Take 40 mg by mouth 2 (two) times daily.   Yes Historical Provider, MD  rosuvastatin (CRESTOR) 10 MG tablet Take 10 mg by mouth daily.   Yes Historical Provider, MD  Vitamin D, Ergocalciferol, (DRISDOL) 50000 UNITS CAPS capsule Take 50,000 Units by mouth every Monday.   Yes Historical Provider, MD  warfarin (COUMADIN) 5 MG tablet Take 2.5-5 mg by  mouth daily. 5 mg tablet Monday, Wednesday, Friday and Saturday. Tuesday, Thursday and Sunday 2.5 mg   Yes Historical Provider, MD  zolpidem (AMBIEN) 10 MG tablet Take 5 mg by mouth at bedtime as needed. sleep   Yes Historical Provider, MD  ACCU-CHEK FASTCLIX LANCETS MISC 2 (two) times daily. 07/03/12   Historical Provider, MD  Blood Glucose Monitoring Suppl (ACCU-CHEK AVIVA PLUS) W/DEVICE KIT 1 each by Does not apply route as needed. 11/18/12   Reather Littler, MD  glucose blood (ACCU-CHEK AVIVA PLUS) test strip Use to check blood sugars 2 times daily dx code 250.00 11/15/12   Reather Littler, MD    Allergies:   Allergies  Allergen Reactions  . Darvon   . Digoxin And Related Nausea Only  . Nitrostat [Nitroglycerin]   . Other     Tylenol #3  . Penicillins Other (See Comments)    Was told had allergy from childhood... Unknown reaction  . Percocet [Oxycodone-Acetaminophen]     confused  . Percodan [Oxycodone-Aspirin]   . Vicodin [Hydrocodone-Acetaminophen]     Social  History:  Wheelchair bound Lives at home with family   reports that she has never smoked. She has never used smokeless tobacco. She reports that she does not drink alcohol or use illicit drugs.   Family History: family history includes Breast cancer in her daughter; Colon polyps in her daughter; Diabetes in her brother, daughter, father, and son; Heart attack in her brother; Heart disease in her father; Stroke in her sister.    Physical Exam: Patient Vitals for the past 24 hrs:  BP Temp Temp src Pulse Resp SpO2  11/18/12 1930 97/49 mmHg - - 81 - 100 %  11/18/12 1900 97/54 mmHg - - 85 - 100 %  11/18/12 1844 75/34 mmHg - - 83 - 100 %  11/18/12 1839 72/34 mmHg - - 82 - 100 %  11/18/12 1803 - 99.1 F (37.3 C) Oral - - -  11/18/12 1630 125/74 mmHg - - 95 - 100 %  11/18/12 1600 83/41 mmHg - - 79 - 99 %  11/18/12 1544 82/42 mmHg - - - - -  11/18/12 1500 101/51 mmHg - - - - -  11/18/12 1440 - 100.4 F (38 C) Oral - - -  11/18/12 1437 97/53 mmHg - - - - -  11/18/12 1400 100/52 mmHg - - - - -  11/18/12 1330 110/69 mmHg - - 91 - 100 %  11/18/12 1300 115/56 mmHg - - 86 - 99 %  11/18/12 1230 131/63 mmHg - - - - -  11/18/12 1211 - 102.8 F (39.3 C) Oral 96 20 89 %    1. General:  in No Acute distress 2. Psychological: Alert and   Oriented 3. Head/ENT:    Dry Mucous Membranes                          Head Non traumatic, neck supple                           Poor Dentition 4. SKIN: normal   Skin turgor,  Skin clean Dry and intact no rash 5. Heart: Regular rate and rhythm no Murmur, Rub or gallop 6. Lungs: Clear to auscultation bilaterally, no wheezes or crackles   7. Abdomen: Soft, non-tender, Non distended, obese 8. Lower extremities: no clubbing, cyanosis,  2+edema bilaterally  9. Neurologically Grossly intact, moving all 4 extremities  equally 10. MSK: Normal range of motion  body mass index is unknown because there is no weight on file.   Labs on Admission:   Recent Labs   11/18/12 1240  NA 135  K 4.7  CL 98  CO2 27  GLUCOSE 218*  BUN 18  CREATININE 1.37*  CALCIUM 9.7    Recent Labs  11/18/12 1240  AST 19  ALT 10  ALKPHOS 84  BILITOT 0.9  PROT 7.3  ALBUMIN 3.5   No results found for this basename: LIPASE, AMYLASE,  in the last 72 hours  Recent Labs  11/18/12 1240  WBC 16.8*  NEUTROABS 15.2*  HGB 13.5  HCT 40.9  MCV 90.3  PLT 142*   No results found for this basename: CKTOTAL, CKMB, CKMBINDEX, TROPONINI,  in the last 72 hours No results found for this basename: TSH, T4TOTAL, FREET3, T3FREE, THYROIDAB,  in the last 72 hours No results found for this basename: VITAMINB12, FOLATE, FERRITIN, TIBC, IRON, RETICCTPCT,  in the last 72 hours Lab Results  Component Value Date   HGBA1C 6.9* 06/13/2012    The CrCl is unknown because both a height and weight (above a minimum accepted value) are required for this calculation. ABG    Component Value Date/Time   TCO2 26 02/22/2012 1916     Lab Results  Component Value Date   DDIMER 2.14* 12/29/2011     Other results:   UA 3-6 WBC many bacteria, neg nitrite   Cultures:    Component Value Date/Time   SDES URINE, CATHETERIZED 02/22/2012 2042   SPECREQUEST NONE 02/22/2012 2042   CULT  Value: ESCHERICHIA COLI Note: Confirmed Extended Spectrum Beta-Lactamase Producer (ESBL) 02/22/2012 2042   REPTSTATUS 02/24/2012 FINAL 02/22/2012 2042       Radiological Exams on Admission: Dg Chest 2 View  11/18/2012   CLINICAL DATA:  Fever and vomiting.  EXAM: CHEST  2 VIEW  COMPARISON:  10/28/2012  FINDINGS: Patient is rotated to the left. Left-sided pacemaker and sternotomy wires are unchanged. Lungs are adequately inflated the without consolidation or effusion. There is mild stable cardiomegaly. Remainder of the exam is unchanged.  IMPRESSION: No active cardiopulmonary disease.  Mild stable cardiomegaly.   Electronically Signed   By: Elberta Fortis M.D.   On: 11/18/2012 13:32   Ct Abdomen Pelvis W  Contrast  11/18/2012   CLINICAL DATA:  Generalized weakness and fever. Productive cough.  EXAM: CT ABDOMEN AND PELVIS WITH CONTRAST  TECHNIQUE: Multidetector CT imaging of the abdomen and pelvis was performed using the standard protocol following bolus administration of intravenous contrast.  CONTRAST:  80mL OMNIPAQUE IOHEXOL 300 MG/ML  SOLN  COMPARISON:  12/17/2011  FINDINGS: Images which include the lower chest show cardiomegaly the without pericardial or pleural effusion visible.  No focal abnormality is seen in the liver or spleen. A tiny hiatal hernia is noted. Stomach otherwise unremarkable. Duodenum, pancreas, and adrenal glands have normal imaging features. The gallbladder is distended. The tiny stones seen on the previous study are less evident today, but may be visible on image 25.  Kidneys show cortical thinning bilaterally. 15 mm water density lesion in the interpolar region of the left kidney was present previously and is compatible with a cyst. Bilateral perinephric stranding is unchanged and can be nonacute.  No abdominal aortic aneurysm. Scattered small retroperitoneal lymph nodes are evident. The portal vein and superior mesenteric vein are patent. No evidence for bowel dilatation.  Imaging through the pelvis shows no free intraperitoneal fluid.  There is no pelvic sidewall lymphadenopathy. Bladder is moderately distended. Uterus is unremarkable. There is no adnexal mass.  No substantial diverticular disease in the colon. No evidence for colonic diverticulitis. Terminal ileum is unremarkable. The appendix is not visualized, but there is no edema or inflammation in the region of the cecum.  Bone windows reveal no worrisome lytic or sclerotic osseous lesions.  IMPRESSION: No acute findings in the abdomen or pelvis. No evidence to explain the patient's history of weakness and fever. Specifically, no evidence for colonic diverticulitis.   Electronically Signed   By: Kennith Center M.D.   On: 11/18/2012  17:23    Chart has been reviewed  Assessment/Plan  This is a 79 year old female presents with most likely sepsis source possibly urine.  Present on Admission:  . Fever  - source at this point most likely urine, await results of urine and blood cultures  . Sepsis - monitor and step down IV fluids, blood cultures urine culture, broad-spectrum antibiotics, will also do and will, but did get a morning repeat, pressors if needed. CCM has been aware of the patient and to help monitor physical E.-link for now cover with vancomycin and Zosyn broadly  . Type II or unspecified type diabetes mellitus without mention of complication, not stated as uncontrolled - sliding scale  . Nausea with vomiting - currently resolved supportive management  . Hypotension - improving with IV fluids continue to monitor and step down.  . DVT of upper extremity (deep vein thrombosis) - Continue Coumadin per pharmacy watch for signs of bleeding patient have had history of gastritis and gastric ulcers with GI bleed in the past  UTI- mild but most likely source of infection will, with Zosyn  Prophylaxis: coumadin  Protonix  CODE STATUS: limitted code, pressors and shock ok DNI, no CPR  Other plan as per orders.  I have spent a total of 65 min on this admission, spoke with CCM regarding the patient took time to discuss CODE STATUS extensively family   Jermey Closs 11/18/2012, 8:06 PM

## 2012-11-18 NOTE — ED Notes (Signed)
Per EMS- Patient c/o generalized weakness and fever at home of 101.5. Patient also has a productive cough with yellow sputum, and chest wall pain when taking a deep breath or coughing.

## 2012-11-18 NOTE — Progress Notes (Signed)
ANTICOAGULATION CONSULT NOTE - Initial Consult  Pharmacy Consult for Warfarin  Indication: atrial fibrillation, hx DVT  Allergies  Allergen Reactions  . Darvon   . Digoxin And Related Nausea Only  . Nitrostat [Nitroglycerin]   . Penicillins Other (See Comments)    Was told had allergy from childhood... Unknown reaction  . Percocet [Oxycodone-Acetaminophen]     confused  . Percodan [Oxycodone-Aspirin]   . Vicodin [Hydrocodone-Acetaminophen]     Patient Measurements: Height: 5' 4.17" (163 cm) (Documented 11/01/12) Weight: 198 lb 13.7 oz (90.2 kg) IBW/kg (Calculated) : 55.1 Heparin Dosing Weight:   Vital Signs: Temp: 98.2 F (36.8 C) (11/03 2100) Temp src: Oral (11/03 2100) BP: 105/54 mmHg (11/03 2100) Pulse Rate: 70 (11/03 2100)  Labs:  Recent Labs  11/18/12 1240 11/18/12 1600  HGB 13.5  --   HCT 40.9  --   PLT 142*  --   LABPROT  --  26.9*  INR  --  2.59*  CREATININE 1.37*  --     Estimated Creatinine Clearance: 29.8 ml/min (by C-G formula based on Cr of 1.37).   Medical History: Past Medical History  Diagnosis Date  . Hypertension   . Coronary artery disease   . Legally blind   . Chronic kidney disease   . Anemia   . Hypothyroid   . Gastric ulcer   . Hiatal hernia   . Dyspepsia   . UTI (lower urinary tract infection)   . H/O: GI bleed 12//13  . Diastolic dysfunction   . Pacemaker   . High cholesterol   . CHF (congestive heart failure)   . Anginal pain   . MI (myocardial infarction) ? 1994  . DVT of upper extremity (deep vein thrombosis) 06/13/2012    BUE  . Seasonal allergies   . Allergy to perfume     "strong perfumes only" (06/13/2012)  . Shortness of breath     "can happen at any time" (06/13/2012)  . Type II diabetes mellitus   . History of blood transfusion 2013    "not related to OR; BP had dropped" (06/13/2012)  . GERD (gastroesophageal reflux disease)   . Daily headache   . Arthritis     "all over" (06/13/2012)  . A-fib   . 2Nd  degree atrioventricular block     Medications:  Scheduled:  . sodium chloride   Intravenous STAT  . aspirin  81 mg Oral Daily  . [START ON 11/19/2012] atorvastatin  20 mg Oral q1800  . docusate sodium  100 mg Oral BID  . gabapentin  100 mg Oral BID  . guaiFENesin  600 mg Oral BID  . [START ON 11/19/2012] insulin aspart  0-15 Units Subcutaneous TID WC  . insulin aspart  0-5 Units Subcutaneous QHS  . [START ON 11/19/2012] levothyroxine  50 mcg Oral QAC breakfast  . metoprolol tartrate  12.5 mg Oral BID  . pantoprazole  40 mg Oral BID  . piperacillin-tazobactam (ZOSYN)  IV  3.375 g Intravenous Q8H  . senna  1 tablet Oral BID  . sodium chloride  3 mL Intravenous Q12H   Infusions:   PRN: acetaminophen, acetaminophen, acetaminophen-codeine, albuterol, bisacodyl, HYDROcodone-acetaminophen, iohexol, ondansetron (ZOFRAN) IV, ondansetron (ZOFRAN) IV, ondansetron, polyethylene glycol, sodium phosphate, zolpidem  Assessment: 79 yo F who has been on warfarin prior to admission. MWFSat-- 5mg , Tu,TH, Sun-- 2.5mg  Goal of Therapy:  INR 2-3 Monitor platelets by anticoagulation protocol: Yes   Plan:  Resume home dose of Warfarin 5mg  tonight x 1 PT/INR  in AM Coumadin teaching book/video.  Loletta Specter 11/18/2012,10:22 PM

## 2012-11-19 DIAGNOSIS — I4891 Unspecified atrial fibrillation: Secondary | ICD-10-CM

## 2012-11-19 DIAGNOSIS — E039 Hypothyroidism, unspecified: Secondary | ICD-10-CM

## 2012-11-19 DIAGNOSIS — I480 Paroxysmal atrial fibrillation: Secondary | ICD-10-CM | POA: Diagnosis present

## 2012-11-19 LAB — MRSA PCR SCREENING: MRSA by PCR: NEGATIVE

## 2012-11-19 LAB — COMPREHENSIVE METABOLIC PANEL
ALT: 6 U/L (ref 0–35)
AST: 14 U/L (ref 0–37)
Albumin: 2.6 g/dL — ABNORMAL LOW (ref 3.5–5.2)
BUN: 18 mg/dL (ref 6–23)
Chloride: 104 mEq/L (ref 96–112)
Creatinine, Ser: 1.34 mg/dL — ABNORMAL HIGH (ref 0.50–1.10)
Potassium: 4.2 mEq/L (ref 3.5–5.1)
Sodium: 136 mEq/L (ref 135–145)
Total Bilirubin: 0.8 mg/dL (ref 0.3–1.2)
Total Protein: 5.6 g/dL — ABNORMAL LOW (ref 6.0–8.3)

## 2012-11-19 LAB — CBC
HCT: 33.6 % — ABNORMAL LOW (ref 36.0–46.0)
MCH: 29.7 pg (ref 26.0–34.0)
MCV: 90.8 fL (ref 78.0–100.0)
Platelets: 123 10*3/uL — ABNORMAL LOW (ref 150–400)
RBC: 3.7 MIL/uL — ABNORMAL LOW (ref 3.87–5.11)
WBC: 11.7 10*3/uL — ABNORMAL HIGH (ref 4.0–10.5)

## 2012-11-19 LAB — GLUCOSE, CAPILLARY
Glucose-Capillary: 112 mg/dL — ABNORMAL HIGH (ref 70–99)
Glucose-Capillary: 135 mg/dL — ABNORMAL HIGH (ref 70–99)
Glucose-Capillary: 168 mg/dL — ABNORMAL HIGH (ref 70–99)

## 2012-11-19 LAB — PHOSPHORUS: Phosphorus: 3 mg/dL (ref 2.3–4.6)

## 2012-11-19 LAB — TSH: TSH: 1.665 u[IU]/mL (ref 0.350–4.500)

## 2012-11-19 LAB — LACTIC ACID, PLASMA: Lactic Acid, Venous: 1.6 mmol/L (ref 0.5–2.2)

## 2012-11-19 LAB — HEMOGLOBIN A1C
Hgb A1c MFr Bld: 8 % — ABNORMAL HIGH (ref <5.7)
Mean Plasma Glucose: 183 mg/dL — ABNORMAL HIGH (ref <117)

## 2012-11-19 LAB — URINE CULTURE: Culture: NO GROWTH

## 2012-11-19 LAB — MAGNESIUM: Magnesium: 2.1 mg/dL (ref 1.5–2.5)

## 2012-11-19 MED ORDER — SODIUM CHLORIDE 0.9 % IV BOLUS (SEPSIS)
500.0000 mL | Freq: Once | INTRAVENOUS | Status: AC
  Administered 2012-11-19: 02:00:00 via INTRAVENOUS

## 2012-11-19 MED ORDER — SODIUM CHLORIDE 0.9 % IV SOLN
INTRAVENOUS | Status: DC
  Administered 2012-11-19: 16:00:00 via INTRAVENOUS

## 2012-11-19 MED ORDER — WARFARIN SODIUM 1 MG PO TABS
1.0000 mg | ORAL_TABLET | Freq: Once | ORAL | Status: AC
  Administered 2012-11-19: 1 mg via ORAL
  Filled 2012-11-19: qty 1

## 2012-11-19 MED ORDER — GLUCERNA SHAKE PO LIQD
237.0000 mL | Freq: Two times a day (BID) | ORAL | Status: DC
  Administered 2012-11-19 – 2012-11-21 (×5): 237 mL via ORAL
  Filled 2012-11-19 (×6): qty 237

## 2012-11-19 NOTE — Progress Notes (Signed)
INITIAL NUTRITION ASSESSMENT  DOCUMENTATION CODES Per approved criteria  -Not Applicable   INTERVENTION: Provide Glucerna Shakes BID Provide a snack once daily  NUTRITION DIAGNOSIS: Inadequate oral intake related to poor appetite as evidenced by pt's report and pt eating <50% of meals.   Goal: Pt to meet >/= 90% of their estimated nutrition needs   Monitor:  PO intake Weight Labs  Reason for Assessment: Malnutrition Screening Tool, score of 2  79 y.o. female  Admitting Dx: <principal problem not specified>  ASSESSMENT: 78 y.o. female with a past medical history of Hypertension; Coronary artery disease; Legally blind; Chronic kidney disease; Anemia; Hypothyroid; Gastric ulcer; Hiatal hernia; Dyspepsia; UTI,  GI bleed (12//13); Diastolic dysfunction; Pacemaker; High cholesterol; CHF, MI (? 1994); DVT of upper extremity (06/13/2012), Type II diabetes mellitus; History of blood transfusion (2013), and GERD. Presented feeling fatigue and having fever since this AM. Patient was noted to be hypotensive upon arrival to the ER she received IV fluids 2 boluses but remained hypotensive systolics at 90.  Pt reports that she usually weighs between 175 and 190 lbs and she usually eats 2 meals daily (breakfast and dinner) and usually drinks Glucerna Shakes twice daily. Pt states that she doesn't usually feel hungry but, eats anyway. Per RN pt is not eating well today. Pt ate <25% of her lunch and about 50% of breakfast today.  Height: Ht Readings from Last 1 Encounters:  11/18/12 5' 4.17" (1.63 m)    Weight: Wt Readings from Last 1 Encounters:  11/18/12 198 lb 13.7 oz (90.2 kg)    Ideal Body Weight: 120 lbs  % Ideal Body Weight: 165%  Wt Readings from Last 10 Encounters:  11/18/12 198 lb 13.7 oz (90.2 kg)  10/09/12 197 lb 4.8 oz (89.495 kg)  09/06/12 196 lb 9.6 oz (89.177 kg)  07/18/12 193 lb 9.6 oz (87.816 kg)  07/03/12 185 lb 8 oz (84.142 kg)  06/19/12 194 lb 7.1 oz (88.2 kg)   02/22/12 180 lb 5.4 oz (81.8 kg)  01/03/12 192 lb 14.4 oz (87.499 kg)  12/26/11 215 lb (97.523 kg)  12/26/11 215 lb (97.523 kg)    Usual Body Weight: 190 lbs  % Usual Body Weight: 104%  BMI:  Body mass index is 33.95 kg/(m^2).  Estimated Nutritional Needs: Kcal: 1600-1800 Protein: 90-110 grams Fluid: 1.8-2 L/day  Skin: +1 Generalized edema  Diet Order: Carb Control  EDUCATION NEEDS: -No education needs identified at this time   Intake/Output Summary (Last 24 hours) at 11/19/12 1412 Last data filed at 11/19/12 1300  Gross per 24 hour  Intake 1728.75 ml  Output     90 ml  Net 1638.75 ml    Last BM: 11/2   Labs:   Recent Labs Lab 11/18/12 1240 11/19/12 0310  NA 135 136  K 4.7 4.2  CL 98 104  CO2 27 23  BUN 18 18  CREATININE 1.37* 1.34*  CALCIUM 9.7 8.3*  MG  --  2.1  PHOS  --  3.0  GLUCOSE 218* 157*    CBG (last 3)   Recent Labs  11/18/12 2130 11/19/12 0758 11/19/12 1155  GLUCAP 175* 119* 112*    Scheduled Meds: . aspirin  81 mg Oral Daily  . atorvastatin  20 mg Oral q1800  . docusate sodium  100 mg Oral BID  . gabapentin  100 mg Oral BID  . guaiFENesin  600 mg Oral BID  . insulin aspart  0-15 Units Subcutaneous TID WC  . insulin  aspart  0-5 Units Subcutaneous QHS  . levothyroxine  50 mcg Oral QAC breakfast  . metoprolol tartrate  12.5 mg Oral BID  . pantoprazole  40 mg Oral BID  . piperacillin-tazobactam (ZOSYN)  IV  3.375 g Intravenous Q8H  . senna  1 tablet Oral BID  . sodium chloride  3 mL Intravenous Q12H  . vancomycin  1,000 mg Intravenous Q24H  . warfarin  1 mg Oral ONCE-1800  . warfarin   Does not apply Once  . Warfarin - Pharmacist Dosing Inpatient   Does not apply q1800    Continuous Infusions: . sodium chloride      Past Medical History  Diagnosis Date  . Hypertension   . Coronary artery disease   . Legally blind   . Chronic kidney disease   . Anemia   . Hypothyroid   . Gastric ulcer   . Hiatal hernia   .  Dyspepsia   . UTI (lower urinary tract infection)   . H/O: GI bleed 12//13  . Diastolic dysfunction   . Pacemaker   . High cholesterol   . CHF (congestive heart failure)   . Anginal pain   . MI (myocardial infarction) ? 1994  . DVT of upper extremity (deep vein thrombosis) 06/13/2012    BUE  . Seasonal allergies   . Allergy to perfume     "strong perfumes only" (06/13/2012)  . Shortness of breath     "can happen at any time" (06/13/2012)  . Type II diabetes mellitus   . History of blood transfusion 2013    "not related to OR; BP had dropped" (06/13/2012)  . GERD (gastroesophageal reflux disease)   . Daily headache   . Arthritis     "all over" (06/13/2012)  . A-fib   . 2Nd degree atrioventricular block     Past Surgical History  Procedure Laterality Date  . Esophagogastroduodenoscopy  12/22/2011    Procedure: ESOPHAGOGASTRODUODENOSCOPY (EGD);  Surgeon: Iva Boop, MD;  Location: [facility];  Service: Endoscopy;  Laterality: N/A;  . Cardioversion  06/06/06    successful  . Insert / replace / remove pacemaker  06/29/2006    [employer] adapta  . Cardiac catheterization  10/25/92  . Cardiac catheterization  11/18/03    w/grafts 100%CX LAD 80 & 100%  . Cardiac catheterization  01/24/05    diffuse disease of native vessels  . Cardiac catheterization  06/06/06    severe native CAD  . Coronary artery bypass graft  10/27/92    LIMA to LAD,SVG to LAD second diagonal,obtuse maraginal of the CX and posterior descendingbranch of the RCA  . Cataract extraction w/ intraocular lens  implant, bilateral Bilateral   . Refractive surgery Bilateral     Ian Malkin RD, LDN Inpatient Clinical Dietitian Pager: [phone] After Hours Pager: [phone]

## 2012-11-19 NOTE — Telephone Encounter (Signed)
Message sent to Dr. C. 

## 2012-11-19 NOTE — Progress Notes (Signed)
[id number] Carrie Plater, RN, BSN, [place] [phone] Chart Reviewed for discharge and hospital needs. Discharge needs present at time of review:  Lives with daughter will follow for needs. Review of patient progress due on [id number].

## 2012-11-19 NOTE — Progress Notes (Signed)
[facility] PROGRESS NOTE  [name] ZOX:[id number] DOB: October 05, 1920 DOA: 11/18/2012 PCP: August Saucer [name], MD  Assessment/Plan: Active Problems:   Hypotension: Secondary to sepsis. Improving with IV fluids antibiotics.    Acute on chronic renal failure: Looks to be close to baseline     UTI (lower urinary tract infection): Suspect is primary source for sepsis. Await cultures and would continue broad-spectrum antibiotics it is not clear cut   Fever: See above    Chronic diastolic heart failure: Aggressively hydrating right now because of sepsis. We'll use of fluids his blood pressure improves. Check BNP tomorrow morning    Type II or unspecified type diabetes mellitus without mention of complication, not stated as uncontrolled: Continued on sliding scale.    Sepsis: On brought spectrum antibiotics. Blood cultures pending. Possible urine source although UA not impressive.  History of  DVT of upper extremity (deep vein thrombosis): Continue Coumadin as per pharmacy. INR remained therapeutic.  History of atrial fibrillation with pacemaker: Stable. INR therapeutic   Code Status: Limited code.  Family Communication: Plan discussed with patient's daughter at the bedside  Disposition Plan: Improving, likely transfer to floor later today if blood pressure remains stable   Consultants:  None  Procedures:  None  Antibiotics:  Vancomycin/Zosyn day 2  HPI/Subjective: Patient feeling much better. Denies any breathing issues. Complains of mild abdominal pain in left lower quadrant which is chronic for her. No headaches or other complaints  Objective: Filed Vitals:   11/19/12 0800  BP: 111/57  Pulse:   Temp:   Resp: 12    Intake/Output Summary (Last 24 hours) at 11/19/12 8119 Last data filed at 11/19/12 0500  Gross per 24 hour  Intake 1108.75 ml  Output      0 ml  Net 1108.75 ml   Filed Weights   11/18/12 2100  Weight: 90.2 kg (198 lb 13.7 oz)     Exam:   General:  Alert and oriented x2, no acute distress  Cardiovascular: Regular rate and rhythm, S1-S2  Respiratory: Clear to auscultation bilaterally  Abdomen: Soft, obese, nontender with palpation, hypoactive bowel sounds  Musculoskeletal: No clubbing or cyanosis, trace pitting edema bilaterally   Data Reviewed: Basic Metabolic Panel:  Recent Labs Lab 11/18/12 1240 11/19/12 0310  NA 135 136  K 4.7 4.2  CL 98 104  CO2 27 23  GLUCOSE 218* 157*  BUN 18 18  CREATININE 1.37* 1.34*  CALCIUM 9.7 8.3*  MG  --  2.1  PHOS  --  3.0   Liver Function Tests:  Recent Labs Lab 11/18/12 1240 11/19/12 0310  AST 19 14  ALT 10 6  ALKPHOS 84 59  BILITOT 0.9 0.8  PROT 7.3 5.6*  ALBUMIN 3.5 2.6*   CBC:  Recent Labs Lab 11/18/12 1240 11/19/12 0310  WBC 16.8* 11.7*  NEUTROABS 15.2*  --   HGB 13.5 11.0*  HCT 40.9 33.6*  MCV 90.3 90.8  PLT 142* 123*   Cardiac Enzymes: No results found for this basename: CKTOTAL, CKMB, CKMBINDEX, TROPONINI,  in the last 168 hours BNP (last 3 results)  Recent Labs  12/15/11 0650 12/17/11 1603 01/01/12 0435  PROBNP 1637.0* 2026.0* 2693.0*   CBG:  Recent Labs Lab 11/18/12 2130 11/19/12 0758  GLUCAP 175* 119*    Recent Results (from the past 240 hour(s))  MRSA PCR SCREENING     Status: None   Collection Time    11/18/12  9:57 PM      Result Value Range Status  MRSA by PCR NEGATIVE  NEGATIVE Final   Comment:            The GeneXpert MRSA Assay (FDA     approved for NASAL specimens     only), is one component of a     comprehensive MRSA colonization     surveillance program. It is not     intended to diagnose MRSA     infection nor to guide or     monitor treatment for     MRSA infections.     Studies: Dg Chest 2 View  11/18/2012    IMPRESSION: No active cardiopulmonary disease.  Mild stable cardiomegaly.   Electronically Signed   By: Elberta Fortis M.D.   On: 11/18/2012 13:32   Ct Abdomen Pelvis W  Contrast  11/18/2012     IMPRESSION: No acute findings in the abdomen or pelvis. No evidence to explain the patient's history of weakness and fever. Specifically, no evidence for colonic diverticulitis.   Electronically Signed   By: Kennith Center M.D.   On: 11/18/2012 17:23    Scheduled Meds: . sodium chloride   Intravenous STAT  . aspirin  81 mg Oral Daily  . atorvastatin  20 mg Oral q1800  . coumadin book   Does not apply Once  . docusate sodium  100 mg Oral BID  . gabapentin  100 mg Oral BID  . guaiFENesin  600 mg Oral BID  . insulin aspart  0-15 Units Subcutaneous TID WC  . insulin aspart  0-5 Units Subcutaneous QHS  . levothyroxine  50 mcg Oral QAC breakfast  . metoprolol tartrate  12.5 mg Oral BID  . pantoprazole  40 mg Oral BID  . piperacillin-tazobactam (ZOSYN)  IV  3.375 g Intravenous Q8H  . senna  1 tablet Oral BID  . sodium chloride  3 mL Intravenous Q12H  . vancomycin  1,000 mg Intravenous Q24H  . warfarin   Does not apply Once  . Warfarin - Pharmacist Dosing Inpatient   Does not apply q1800   Continuous Infusions:   Active Problems:   Hypotension   Acute on chronic renal failure   Nausea with vomiting   UTI (lower urinary tract infection)   Fever   Chronic diastolic heart failure   Type II or unspecified type diabetes mellitus without mention of complication, not stated as uncontrolled   Sepsis   DVT of upper extremity (deep vein thrombosis)    Time spent: 25 minutes    [name]  [facility] Pager [phone]. If 7PM-7AM, please contact night-coverage at [email or web address], password [facility] 11/19/2012, 8:21 AM  LOS: 1 day

## 2012-11-19 NOTE — Progress Notes (Signed)
ANTICOAGULATION CONSULT NOTE - Follow Up Consult  Pharmacy Consult for Warfarin Indication: Hx DVT, atrial fibrillation  Allergies  Allergen Reactions  . Darvon   . Digoxin And Related Nausea Only  . Nitrostat [Nitroglycerin]   . Penicillins Other (See Comments)    Was told had allergy from childhood... Unknown reaction  . Percocet [Oxycodone-Acetaminophen]     confused  . Percodan [Oxycodone-Aspirin]   . Vicodin [Hydrocodone-Acetaminophen]     Patient Measurements: Height: 5' 4.17" (163 cm) (Documented 11/01/12) Weight: 198 lb 13.7 oz (90.2 kg) IBW/kg (Calculated) : 55.1  Vital Signs: Temp: 97.7 F (36.5 C) (11/04 0400) Temp src: Oral (11/04 0400) BP: 111/57 mmHg (11/04 0800) Pulse Rate: 70 (11/03 2100)  Labs:  Recent Labs  11/18/12 1240 11/18/12 1600 11/19/12 0310  HGB 13.5  --  11.0*  HCT 40.9  --  33.6*  PLT 142*  --  123*  LABPROT  --  26.9* 28.5*  INR  --  2.59* 2.80*  CREATININE 1.37*  --  1.34*    Estimated Creatinine Clearance: 30.4 ml/min (by C-G formula based on Cr of 1.34).   Medications:  Scheduled:  . sodium chloride   Intravenous STAT  . aspirin  81 mg Oral Daily  . atorvastatin  20 mg Oral q1800  . coumadin book   Does not apply Once  . docusate sodium  100 mg Oral BID  . gabapentin  100 mg Oral BID  . guaiFENesin  600 mg Oral BID  . insulin aspart  0-15 Units Subcutaneous TID WC  . insulin aspart  0-5 Units Subcutaneous QHS  . levothyroxine  50 mcg Oral QAC breakfast  . metoprolol tartrate  12.5 mg Oral BID  . pantoprazole  40 mg Oral BID  . piperacillin-tazobactam (ZOSYN)  IV  3.375 g Intravenous Q8H  . senna  1 tablet Oral BID  . sodium chloride  3 mL Intravenous Q12H  . vancomycin  1,000 mg Intravenous Q24H  . warfarin   Does not apply Once  . Warfarin - Pharmacist Dosing Inpatient   Does not apply q1800   Infusions:    Assessment: 79 yo female with multiple medical problems admitted 11/3 with probable urosepsis. On chronic  warfarin for afib and hx DVT - ordered to continue per Pharmacy protocol  Home dose 5mg  Mon, Wed, Fri and Sat, 2.5mg  Tues, Thurs, Sun  INR therapeutic (2.8) today, increased from 2.56 last night  CBC decreased, no bleeding reported  Concurrent aspirin 81mg  daily continued  Warfarin sensitivity may be increased given acute illness, broad spectrum antibiotics and decreased PO intake  Goal of Therapy:  INR 2-3   Plan:   Warfarin 1mg  today   Daily PT/INR  Loralee Pacas, PharmD, BCPS Pager: [phone] 11/19/2012,8:22 AM

## 2012-11-20 DIAGNOSIS — N179 Acute kidney failure, unspecified: Secondary | ICD-10-CM

## 2012-11-20 DIAGNOSIS — Z950 Presence of cardiac pacemaker: Secondary | ICD-10-CM

## 2012-11-20 DIAGNOSIS — I5032 Chronic diastolic (congestive) heart failure: Secondary | ICD-10-CM

## 2012-11-20 DIAGNOSIS — I10 Essential (primary) hypertension: Secondary | ICD-10-CM

## 2012-11-20 DIAGNOSIS — N189 Chronic kidney disease, unspecified: Secondary | ICD-10-CM

## 2012-11-20 LAB — BASIC METABOLIC PANEL
BUN: 18 mg/dL (ref 6–23)
Chloride: 104 mEq/L (ref 96–112)
Creatinine, Ser: 1.44 mg/dL — ABNORMAL HIGH (ref 0.50–1.10)
GFR calc Af Amer: 36 mL/min — ABNORMAL LOW (ref >90)
Sodium: 136 mEq/L (ref 135–145)

## 2012-11-20 LAB — CBC
HCT: 35.5 % — ABNORMAL LOW (ref 36.0–46.0)
Platelets: 128 10*3/uL — ABNORMAL LOW (ref 150–400)
RBC: 3.86 MIL/uL — ABNORMAL LOW (ref 3.87–5.11)
RDW: 14 % (ref 11.5–15.5)
WBC: 5 10*3/uL (ref 4.0–10.5)

## 2012-11-20 LAB — GLUCOSE, CAPILLARY
Glucose-Capillary: 147 mg/dL — ABNORMAL HIGH (ref 70–99)
Glucose-Capillary: 153 mg/dL — ABNORMAL HIGH (ref 70–99)
Glucose-Capillary: 196 mg/dL — ABNORMAL HIGH (ref 70–99)

## 2012-11-20 LAB — PRO B NATRIURETIC PEPTIDE: Pro B Natriuretic peptide (BNP): 1883 pg/mL — ABNORMAL HIGH (ref 0–450)

## 2012-11-20 LAB — PROCALCITONIN: Procalcitonin: 1.23 ng/mL

## 2012-11-20 NOTE — Progress Notes (Signed)
[facility] PROGRESS NOTE  [name] WUJ:[id number] DOB: 09/20/1920 DOA: 11/18/2012 PCP: August Saucer [name], MD  Assessment/Plan: Hypotension: Secondary to sepsis. Improved with IV fluids antibiotics. -Fluids decreased to [facility] secondary to patient CHF   Acute on chronic renal failure: Baseline creatinine approximately 1.1;  current creatinine = 1.44  UTI (lower urinary tract infection): Suspect is primary source for sepsis. Cultures to date are negative however we'll continue antibiotics as patient is improving clinically  -Patient was positive for ESBL in February 2014 -All cultures have been negative patient has responded to antibiotics we'll transition over to 2 additional days of by mouth antibiotics and discharge in a.m. if stable - Urine cultures negative to date  Fever: Resolved  Chronic diastolic heart failure: Aggressively hydrating right now because of sepsis.  -Continue patient at [facility]  -11/4 BNP= 1883   Type II or unspecified type diabetes mellitus without mention of complication, -Continue on sliding scale.   Sepsis: On brought spectrum antibiotics. Urine/Blood cultures negative to date.  -Complete 5 day course of antibiotics as patient has responded  History of DVT of upper extremity (deep vein thrombosis): Continue Coumadin as per pharmacy. INR is slightly supratherapeutic  -Pharmacy we'll recheck in a.m.   Hx Atrial Fibrillation with pacemaker: Stable. INR therapeutic    Code Status: Limited code.  Family Communication: Plan discussed with patient's daughter at the bedside  Disposition Plan: Improving, likely transfer to floor later today if blood pressure remains stable      Consultants:    Procedures: MRSA PCR -11/3 Blood culture 11/3; negative to date  urine culture 11/3; negative today  Antibiotics: Vancomycin 11/3 Zosyn 11/3    HPI/Subjective: [name] is a 79 y.o. female PMHx Hypertension; Coronary artery disease; Legally  blind; Chronic kidney disease; Anemia; Hypothyroid; Gastric ulcer; Hiatal hernia; Dyspepsia; UTI (lower urinary tract infection); H/O: GI bleed (12//13); Diastolic dysfunction; Pacemaker; High cholesterol; CHF (congestive heart failure); Anginal pain; MI (myocardial infarction) (? 1994); DVT of upper extremity (deep vein thrombosis) (06/13/2012); Seasonal allergies; Allergy to perfume; Shortness of breath; Type II diabetes mellitus; History of blood transfusion (2013); GERD (gastroesophageal reflux disease); Daily headache; Arthritis; A-fib; and 2Nd degree atrioventricular block.   Presented with  Feeling fatigue and having fever since this AM. She have had increased urination. She has had some vague abdominal pain. On Arrival to ED CT of abdomen was unremarkable. She have had a sore throat. She reports hx of chronic cough for over 1 year but nothing new. She have not had much of anything to eat all day and states that her mouth is dry she feels thirsty. Reports last BM 2 days ago. She has to have a laxative to have a BM. Denies any sick contacts. At home patient is wheelchair bound but able to transfer on her own at baseline. She is hard of hearing but answers questions appropriately. She had had recent recurrent chest pain reproducible palpation over the side of her pacemaker which cardiology deemed to be noncardiac. Patient was noted to be hypotensive upon arrival to the ER she received IV fluids 2 boluses but remained hypotensive systolics at 90. At that point hospitalist was called for admission. Family and patient states they do not wish-year-old measures I do not wish CPR or intubation but they do wish to give pressors if needed TODAY states feels greatly improved, negative abdominal pain, -10/3, negative SOB, negative CP   Objective: Filed Vitals:   11/19/12 1800 11/19/12 1908 11/20/12 0149 11/20/12 0426  BP:  137/62 111/52 124/60  Pulse: 63 70 63 64  Temp:  98.3 F (36.8 C) 97.7 F (36.5 C) 97.5  F (36.4 C)  TempSrc:  Oral Oral Oral  Resp: 16 20 18 18   Height:  5\' 4"  (1.626 m)    Weight:  97 kg (213 lb 13.5 oz)  93.3 kg (205 lb 11 oz)  SpO2: 100% 100% 96% 97%    Intake/Output Summary (Last 24 hours) at 11/20/12 0757 Last data filed at 11/20/12 0700  Gross per 24 hour  Intake 1698.08 ml  Output    890 ml  Net 808.08 ml   Filed Weights   11/18/12 2100 11/19/12 1908 11/20/12 0426  Weight: 90.2 kg (198 lb 13.7 oz) 97 kg (213 lb 13.5 oz) 93.3 kg (205 lb 11 oz)    Exam:   General: A./O. x4, NAD  Cardiovascular: Regular rhythm and rate, negative murmurs rubs gallops, DP/PT pulse one plus bilateral  Respiratory: Clear to auscultation bilateral  Abdomen: Obese, soft, nontender, bowel sounds  Musculoskeletal: Mild bilateral pedal edema 1-2+   Data Reviewed: Basic Metabolic Panel:  Recent Labs Lab 11/18/12 1240 11/19/12 0310 11/20/12 0458  NA 135 136 136  K 4.7 4.2 4.2  CL 98 104 104  CO2 27 23 24   GLUCOSE 218* 157* 180*  BUN 18 18 18   CREATININE 1.37* 1.34* 1.44*  CALCIUM 9.7 8.3* 8.7  MG  --  2.1  --   PHOS  --  3.0  --    Liver Function Tests:  Recent Labs Lab 11/18/12 1240 11/19/12 0310  AST 19 14  ALT 10 6  ALKPHOS 84 59  BILITOT 0.9 0.8  PROT 7.3 5.6*  ALBUMIN 3.5 2.6*   No results found for this basename: LIPASE, AMYLASE,  in the last 168 hours No results found for this basename: AMMONIA,  in the last 168 hours CBC:  Recent Labs Lab 11/18/12 1240 11/19/12 0310 11/20/12 0458  WBC 16.8* 11.7* 5.0  NEUTROABS 15.2*  --   --   HGB 13.5 11.0* 11.3*  HCT 40.9 33.6* 35.5*  MCV 90.3 90.8 92.0  PLT 142* 123* 128*   Cardiac Enzymes: No results found for this basename: CKTOTAL, CKMB, CKMBINDEX, TROPONINI,  in the last 168 hours BNP (last 3 results)  Recent Labs  12/17/11 1603 01/01/12 0435 11/20/12 0458  PROBNP 2026.0* 2693.0* 1883.0*   CBG:  Recent Labs Lab 11/19/12 0758 11/19/12 1155 11/19/12 1723 11/19/12 2135  11/20/12 0727  GLUCAP 119* 112* 135* 168* 147*    Recent Results (from the past 240 hour(s))  URINE CULTURE     Status: None   Collection Time    11/18/12  2:44 PM      Result Value Range Status   Specimen Description URINE, CATHETERIZED   Final   Special Requests NONE   Final   Culture  Setup Time     Final   Value: 11/18/2012 21:42     Performed at [facility] Count     Final   Value: NO GROWTH     Performed at [facility]   Culture     Final   Value: NO GROWTH     Performed at [facility]   Report Status 11/19/2012 FINAL   Final  MRSA PCR SCREENING     Status: None   Collection Time    11/18/12  9:57 PM      Result Value Range Status  MRSA by PCR NEGATIVE  NEGATIVE Final   Comment:            The GeneXpert MRSA Assay (FDA     approved for NASAL specimens     only), is one component of a     comprehensive MRSA colonization     surveillance program. It is not     intended to diagnose MRSA     infection nor to guide or     monitor treatment for     MRSA infections.  CULTURE, BLOOD (ROUTINE X 2)     Status: None   Collection Time    11/18/12 10:20 PM      Result Value Range Status   Specimen Description BLOOD LEFT ANTECUBITAL   Final   Special Requests     Final   Value: BOTTLES DRAWN AEROBIC AND ANAEROBIC  AE, ANAE   Culture  Setup Time     Final   Value: 11/19/2012 05:07     Performed at [facility]   Culture     Final   Value:        BLOOD CULTURE RECEIVED NO GROWTH TO DATE CULTURE WILL BE HELD FOR 5 DAYS BEFORE ISSUING A FINAL NEGATIVE REPORT     Performed at [facility]   Report Status PENDING   Incomplete  CULTURE, BLOOD (ROUTINE X 2)     Status: None   Collection Time    11/18/12 10:30 PM      Result Value Range Status   Specimen Description BLOOD LEFT HAND   Final   Special Requests BOTTLES DRAWN AEROBIC ONLY    Final   Culture  Setup Time     Final   Value: 11/19/2012 05:06      Performed at [facility]   Culture     Final   Value:        BLOOD CULTURE RECEIVED NO GROWTH TO DATE CULTURE WILL BE HELD FOR 5 DAYS BEFORE ISSUING A FINAL NEGATIVE REPORT     Performed at [facility]   Report Status PENDING   Incomplete     Studies: Dg Chest 2 View  11/18/2012   CLINICAL DATA:  Fever and vomiting.  EXAM: CHEST  2 VIEW  COMPARISON:  10/28/2012  FINDINGS: Patient is rotated to the left. Left-sided pacemaker and sternotomy wires are unchanged. Lungs are adequately inflated the without consolidation or effusion. There is mild stable cardiomegaly. Remainder of the exam is unchanged.  IMPRESSION: No active cardiopulmonary disease.  Mild stable cardiomegaly.   Electronically Signed   By: Elberta Fortis M.D.   On: 11/18/2012 13:32   Ct Abdomen Pelvis W Contrast  11/18/2012   CLINICAL DATA:  Generalized weakness and fever. Productive cough.  EXAM: CT ABDOMEN AND PELVIS WITH CONTRAST  TECHNIQUE: Multidetector CT imaging of the abdomen and pelvis was performed using the standard protocol following bolus administration of intravenous contrast.  CONTRAST:  80mL OMNIPAQUE IOHEXOL 300 MG/ML  SOLN  COMPARISON:  12/17/2011  FINDINGS: Images which include the lower chest show cardiomegaly the without pericardial or pleural effusion visible.  No focal abnormality is seen in the liver or spleen. A tiny hiatal hernia is noted. Stomach otherwise unremarkable. Duodenum, pancreas, and adrenal glands have normal imaging features. The gallbladder is distended. The tiny stones seen on the previous study are less evident today, but may be visible on image 25.  Kidneys show cortical thinning bilaterally. 15 mm water  density lesion in the interpolar region of the left kidney was present previously and is compatible with a cyst. Bilateral perinephric stranding is unchanged and can be nonacute.  No abdominal aortic aneurysm. Scattered small retroperitoneal lymph nodes are evident. The portal vein and  superior mesenteric vein are patent. No evidence for bowel dilatation.  Imaging through the pelvis shows no free intraperitoneal fluid. There is no pelvic sidewall lymphadenopathy. Bladder is moderately distended. Uterus is unremarkable. There is no adnexal mass.  No substantial diverticular disease in the colon. No evidence for colonic diverticulitis. Terminal ileum is unremarkable. The appendix is not visualized, but there is no edema or inflammation in the region of the cecum.  Bone windows reveal no worrisome lytic or sclerotic osseous lesions.  IMPRESSION: No acute findings in the abdomen or pelvis. No evidence to explain the patient's history of weakness and fever. Specifically, no evidence for colonic diverticulitis.   Electronically Signed   By: Kennith Center M.D.   On: 11/18/2012 17:23    Scheduled Meds: . aspirin  81 mg Oral Daily  . atorvastatin  20 mg Oral q1800  . docusate sodium  100 mg Oral BID  . feeding supplement (GLUCERNA SHAKE)  237 mL Oral BID BM  . gabapentin  100 mg Oral BID  . guaiFENesin  600 mg Oral BID  . insulin aspart  0-15 Units Subcutaneous TID WC  . insulin aspart  0-5 Units Subcutaneous QHS  . levothyroxine  50 mcg Oral QAC breakfast  . metoprolol tartrate  12.5 mg Oral BID  . pantoprazole  40 mg Oral BID  . piperacillin-tazobactam (ZOSYN)  IV  3.375 g Intravenous Q8H  . senna  1 tablet Oral BID  . sodium chloride  3 mL Intravenous Q12H  . vancomycin  1,000 mg Intravenous Q24H  . warfarin   Does not apply Once  . Warfarin - Pharmacist Dosing Inpatient   Does not apply q1800   Continuous Infusions: . sodium chloride 10 mL/hr (11/19/12 1819)    Active Problems:   Hypotension   Acute on chronic renal failure   Nausea with vomiting   UTI (lower urinary tract infection)   Fever   Chronic diastolic heart failure   Type II or unspecified type diabetes mellitus without mention of complication, not stated as uncontrolled   Sepsis   DVT of upper extremity  (deep vein thrombosis)   A-fib    Time spent: 45 minutes    WOODS, CURTIS, J  [facility] Pager [phone]. If 7PM-7AM, please contact night-coverage at [email or web address], password [facility] 11/20/2012, 7:57 AM  LOS: 2 days

## 2012-11-20 NOTE — Progress Notes (Signed)
ANTICOAGULATION CONSULT NOTE - Follow Up Consult  Pharmacy Consult for Warfarin Indication: Hx DVT, atrial fibrillation  Labs:  Recent Labs  11/18/12 1240 11/18/12 1600 11/19/12 0310 11/20/12 0458 11/20/12 1100  HGB 13.5  --  11.0* 11.3*  --   HCT 40.9  --  33.6* 35.5*  --   PLT 142*  --  123* 128*  --   LABPROT  --  26.9* 28.5*  --  34.2*  INR  --  2.59* 2.80*  --  3.55*  CREATININE 1.37*  --  1.34* 1.44*  --     Assessment: 79 yo female with multiple medical problems admitted 11/3 with probable urosepsis. On chronic warfarin for afib and hx DVT - ordered to continue per Pharmacy protocol.  Pt was reportedly on Coumadin 5mg  Mon, Wed, Fri and Sat, 2.5mg  Tues, Thurs, Sun  INR trended up to 3.55 this AM. Hgb 11.3, plts 128K. No bleeding. Concurrent aspirin 81mg  daily continued  Warfarin sensitivity may be increased given acute illness, broad spectrum antibiotics and decreased PO intake  Goal of Therapy:  INR 2-3   Plan:   No Coumadin tonight  Daily PT/INR  Geoffry Paradise, PharmD, BCPS Pager: [phone] 11:43 AM Pharmacy #: [id number]

## 2012-11-21 DIAGNOSIS — Z7901 Long term (current) use of anticoagulants: Secondary | ICD-10-CM

## 2012-11-21 DIAGNOSIS — I82629 Acute embolism and thrombosis of deep veins of unspecified upper extremity: Secondary | ICD-10-CM

## 2012-11-21 LAB — GLUCOSE, CAPILLARY: Glucose-Capillary: 132 mg/dL — ABNORMAL HIGH (ref 70–99)

## 2012-11-21 LAB — CBC WITH DIFFERENTIAL/PLATELET
Basophils Absolute: 0 10*3/uL (ref 0.0–0.1)
Eosinophils Relative: 2 % (ref 0–5)
Lymphocytes Relative: 34 % (ref 12–46)
MCV: 91.4 fL (ref 78.0–100.0)
Neutro Abs: 2.6 10*3/uL (ref 1.7–7.7)
Platelets: 129 10*3/uL — ABNORMAL LOW (ref 150–400)
RDW: 13.8 % (ref 11.5–15.5)
WBC: 4.7 10*3/uL (ref 4.0–10.5)

## 2012-11-21 LAB — COMPREHENSIVE METABOLIC PANEL
AST: 14 U/L (ref 0–37)
Albumin: 2.5 g/dL — ABNORMAL LOW (ref 3.5–5.2)
Alkaline Phosphatase: 85 U/L (ref 39–117)
BUN: 19 mg/dL (ref 6–23)
CO2: 23 mEq/L (ref 19–32)
Calcium: 8.7 mg/dL (ref 8.4–10.5)
Chloride: 105 mEq/L (ref 96–112)
Creatinine, Ser: 1.63 mg/dL — ABNORMAL HIGH (ref 0.50–1.10)
GFR calc non Af Amer: 27 mL/min — ABNORMAL LOW (ref >90)
Total Bilirubin: 0.3 mg/dL (ref 0.3–1.2)

## 2012-11-21 LAB — PROTIME-INR: INR: 3.63 — ABNORMAL HIGH (ref 0.00–1.49)

## 2012-11-21 LAB — MAGNESIUM: Magnesium: 2.2 mg/dL (ref 1.5–2.5)

## 2012-11-21 MED ORDER — MAGNESIUM CITRATE PO SOLN
1.0000 | Freq: Once | ORAL | Status: AC
  Administered 2012-11-21: 1 via ORAL

## 2012-11-21 MED ORDER — WARFARIN SODIUM 5 MG PO TABS: 2.5000 mg | ORAL_TABLET | Freq: Every day | ORAL | Status: DC

## 2012-11-21 MED ORDER — FOSFOMYCIN TROMETHAMINE 3 G PO PACK
3.0000 g | PACK | Freq: Once | ORAL | Status: AC
  Administered 2012-11-21: 3 g via ORAL
  Filled 2012-11-21: qty 3

## 2012-11-21 NOTE — Discharge Summary (Signed)
Physician Discharge Summary  [name] NGE:[id number] DOB: 14-May-1920 DOA: 11/18/2012  PCP: Willey Blade, MD  Admit date: 11/18/2012 Discharge date: 11/21/2012  Time spent: 40 minutes  Recommendations for Outpatient Follow-up:  Sepsis: On broad spectrum antibiotics. Urine/Blood cultures negative to date.  -Complete 5 day course of antibiotics as patient has responded; patient received a dose of Fosfomycin which will cover her for the next 2 days of her antibiotic treatment. Patient stable and ready for discharge   Hypotension: Secondary to sepsis. Improved with IV fluids and antibiotics per Early Goal-Directed Therapy Guidelines.  -Fluids decreased to [facility] secondary to patient CHF   Acute on chronic renal failure: Baseline creatinine approximately 1.1; current creatinine = 1.63 -patient to follow creatinine trends closely with PCP   UTI (lower urinary tract infection): Suspect is primary source for sepsis. Cultures to date are negative however we'll continue antibiotics as patient is improving clinically  -Patient was positive for ESBL in February 2014  -All cultures have been negative patient has responded to antibiotics. Receive fosfomycin which will cover patient for 2 more days of antibiotics.   - Urine cultures negative to date   Fever: Resolved   Chronic diastolic heart failure:   -Continue patient at [facility]  -11/4 BNP= 1883   Type II or unspecified type diabetes mellitus without mention of complication, -Continue on sliding scale.   History of DVT of upper extremity (deep vein thrombosis): Continue Coumadin as per pharmacy. INR is slightly supratherapeutic  -per Pharmacy will DC patient on Coumadin 2.5 mg daily and request that patient obtain PT/INR on Monday.    Hx Atrial Fibrillation with pacemaker: Stable. INR therapeutic     Discharge Diagnoses:  Active Problems:   Hypotension   Acute on chronic renal failure   Nausea with vomiting   UTI (lower urinary tract  infection)   Fever   Chronic diastolic heart failure   Type II or unspecified type diabetes mellitus without mention of complication, not stated as uncontrolled   Sepsis   DVT of upper extremity (deep vein thrombosis)   A-fib   Discharge Condition: stable  Diet recommendation: ADA/AHA  [facility] Weights   11/18/12 2100 11/19/12 1908 11/20/12 0426  Weight: 90.2 kg (198 lb 13.7 oz) 97 kg (213 lb 13.5 oz) 93.3 kg (205 lb 11 oz)    History of present illness:  [name] is a 79 y.o. BF PMHx Hypertension; Coronary artery disease; Legally blind; Chronic kidney disease; Anemia; Hypothyroid; Gastric ulcer; Hiatal hernia; Dyspepsia; UTI (lower urinary tract infection); H/O: GI bleed (12//13); Diastolic dysfunction; Pacemaker; High cholesterol; CHF (congestive heart failure); Anginal pain; MI (myocardial infarction) (? 1994); DVT of upper extremity (deep vein thrombosis) (06/13/2012); Seasonal allergies; Allergy to perfume; Shortness of breath; Type II diabetes mellitus; History of blood transfusion (2013); GERD (gastroesophageal reflux disease); Daily headache; Arthritis; A-fib; and 2Nd degree atrioventricular block.  Presented with  Feeling fatigue and having fever since this AM. She have had increased urination. She has had some vague abdominal pain. On Arrival to ED CT of abdomen was unremarkable. She have had a sore throat. She reports hx of chronic cough for over 1 year but nothing new. She have not had much of anything to eat all day and states that her mouth is dry she feels thirsty. Reports last BM 2 days ago. She has to have a laxative to have a BM. Denies any sick contacts. At home patient is wheelchair bound but able to transfer on her own at  baseline. She is hard of hearing but answers questions appropriately. She had had recent recurrent chest pain reproducible palpation over the side of her pacemaker which cardiology deemed to be noncardiac. Patient was noted to be hypotensive upon  arrival to the ER she received IV fluids 2 boluses but remained hypotensive systolics at 90. At that point hospitalist was called for admission. Family and patient states they do not wish-year-old measures I do not wish CPR or intubation but they do wish to give pressors if needed 11/20/2012 states feels greatly improved, negative abdominal pain, -10/3, negative SOB, negative CP. TODAY states feeling greatly improved except for has not had a BM. Patient was given magnesium citrate approximately 60 minutes ago in an attempt to correct this issue  Procedures:  MRSA PCR -11/3  Blood culture 11/3; negative to date urine culture 11/3; negative today    Antibiotics:  Vancomycin 11/3 >>>stopped 11/6 Zosyn 11/3>>>stopped 11/6 Fosfomycin 11/6   Discharge Exam: Filed Vitals:   11/20/12 2124 11/21/12 0619 11/21/12 1018 11/21/12 1443  BP: 113/50 92/51 139/58 137/59  Pulse: 66 60 63 69  Temp: 98.3 F (36.8 C) 98.2 F (36.8 C)  98 F (36.7 C)  TempSrc: Oral Oral  Oral  Resp: 20 16  17   Height:      Weight:      SpO2: 99% 98%  100%   General: A./O. x4, NAD  Cardiovascular: Regular rhythm and rate, negative murmurs rubs gallops, DP/PT pulse one plus bilateral  Respiratory: Clear to auscultation bilateral  Abdomen: Obese, soft, nontender, bowel sounds  Musculoskeletal: Mild bilateral pedal edema 1-2+  Discharge Instructions   Future Appointments Provider Department Dept Phone   11/25/2012 10:00 AM Phillips Hay, RPH-CPP [facility] [phone]   01/24/2013 10:45 AM Reather Littler, MD [facility] [phone]       Medication List    ASK your doctor about these medications       ACCU-CHEK AVIVA PLUS W/DEVICE Kit  1 each by Does not apply route as needed.     ACCU-CHEK FASTCLIX LANCETS Misc  2 (two) times daily.     acetaminophen-codeine 300-30 MG per tablet  Commonly known as:  TYLENOL #3  Take 1 tablet by mouth 2 (two) times daily as needed for  pain.     aspirin 81 MG chewable tablet  Chew 81 mg by mouth daily.     feeding supplement (GLUCERNA SHAKE) Liqd  Take 237 mLs by mouth 2 (two) times daily between meals.     folic acid 1 MG tablet  Commonly known as:  FOLVITE  Take 1 mg by mouth daily.     gabapentin 100 MG capsule  Commonly known as:  NEURONTIN  Take 100 mg by mouth 2 (two) times daily.     glucose blood test strip  Commonly known as:  ACCU-CHEK AVIVA PLUS  Use to check blood sugars 2 times daily dx code 250.00     isosorbide mononitrate 30 MG 24 hr tablet  Commonly known as:  IMDUR  Take 15 mg by mouth daily.     levothyroxine 50 MCG tablet  Commonly known as:  SYNTHROID, LEVOTHROID  Take 50 mcg by mouth daily before breakfast.     loratadine 10 MG tablet  Commonly known as:  CLARITIN  Take 10 mg by mouth daily. For allergies     metoprolol tartrate 25 MG tablet  Commonly known as:  LOPRESSOR  Take 12.5 mg by mouth 2 (two) times daily.  nitroGLYCERIN 0.2 mg/hr patch  Commonly known as:  NITRODUR - Dosed in mg/24 hr  Place 1 patch onto the skin daily as needed. Pt does not wear all the time     pantoprazole 40 MG tablet  Commonly known as:  PROTONIX  Take 40 mg by mouth 2 (two) times daily.     rosuvastatin 10 MG tablet  Commonly known as:  CRESTOR  Take 10 mg by mouth daily.     Vitamin D (Ergocalciferol) 50000 UNITS Caps capsule  Commonly known as:  DRISDOL  Take 50,000 Units by mouth every Monday.     warfarin 5 MG tablet  Commonly known as:  COUMADIN  Take 2.5-5 mg by mouth daily. 5 mg tablet Monday, Wednesday, Friday and Saturday. Tuesday, Thursday and Sunday 2.5 mg     zolpidem 10 MG tablet  Commonly known as:  AMBIEN  Take 5 mg by mouth at bedtime as needed. sleep       Allergies  Allergen Reactions  . Darvon   . Digoxin And Related Nausea Only  . Nitrostat [Nitroglycerin]   . Penicillins Other (See Comments)    Was told had allergy from childhood... Unknown reaction  .  Percocet [Oxycodone-Acetaminophen]     confused  . Percodan [Oxycodone-Aspirin]   . Vicodin [Hydrocodone-Acetaminophen]       The results of significant diagnostics from this hospitalization (including imaging, microbiology, ancillary and laboratory) are listed below for reference.    Significant Diagnostic Studies: Dg Chest 2 View  11/18/2012   CLINICAL DATA:  Fever and vomiting.  EXAM: CHEST  2 VIEW  COMPARISON:  10/28/2012  FINDINGS: Patient is rotated to the left. Left-sided pacemaker and sternotomy wires are unchanged. Lungs are adequately inflated the without consolidation or effusion. There is mild stable cardiomegaly. Remainder of the exam is unchanged.  IMPRESSION: No active cardiopulmonary disease.  Mild stable cardiomegaly.   Electronically Signed   By: Elberta Fortis M.D.   On: 11/18/2012 13:32   Dg Chest 2 View  10/28/2012   CLINICAL DATA:  Cough, congestion, shortness of breath, history hypertension, coronary artery disease post MI and CABG, CHF, diabetes  EXAM: CHEST  2 VIEW  COMPARISON:  02/25/2012  FINDINGS: Left subclavian sequential transvenous pacemaker leads project at right atrium and right ventricle.  Enlargement of cardiac silhouette post CABG.  Tortuous aorta.  Pulmonary vascularity normal.  Eventration right diaphragm stable.  No acute infiltrate, pleural effusion or pneumothorax.  Bones unremarkable.  IMPRESSION: Enlargement of cardiac silhouette post CABG and pacemaker.  No acute abnormalities.   Electronically Signed   By: Ulyses Southward M.D.   On: 10/28/2012 17:16   Ct Abdomen Pelvis W Contrast  11/18/2012   CLINICAL DATA:  Generalized weakness and fever. Productive cough.  EXAM: CT ABDOMEN AND PELVIS WITH CONTRAST  TECHNIQUE: Multidetector CT imaging of the abdomen and pelvis was performed using the standard protocol following bolus administration of intravenous contrast.  CONTRAST:  80mL OMNIPAQUE IOHEXOL 300 MG/ML  SOLN  COMPARISON:  12/17/2011  FINDINGS: Images which  include the lower chest show cardiomegaly the without pericardial or pleural effusion visible.  No focal abnormality is seen in the liver or spleen. A tiny hiatal hernia is noted. Stomach otherwise unremarkable. Duodenum, pancreas, and adrenal glands have normal imaging features. The gallbladder is distended. The tiny stones seen on the previous study are less evident today, but may be visible on image 25.  Kidneys show cortical thinning bilaterally. 15 mm water density lesion in  the interpolar region of the left kidney was present previously and is compatible with a cyst. Bilateral perinephric stranding is unchanged and can be nonacute.  No abdominal aortic aneurysm. Scattered small retroperitoneal lymph nodes are evident. The portal vein and superior mesenteric vein are patent. No evidence for bowel dilatation.  Imaging through the pelvis shows no free intraperitoneal fluid. There is no pelvic sidewall lymphadenopathy. Bladder is moderately distended. Uterus is unremarkable. There is no adnexal mass.  No substantial diverticular disease in the colon. No evidence for colonic diverticulitis. Terminal ileum is unremarkable. The appendix is not visualized, but there is no edema or inflammation in the region of the cecum.  Bone windows reveal no worrisome lytic or sclerotic osseous lesions.  IMPRESSION: No acute findings in the abdomen or pelvis. No evidence to explain the patient's history of weakness and fever. Specifically, no evidence for colonic diverticulitis.   Electronically Signed   By: Kennith Center M.D.   On: 11/18/2012 17:23    Microbiology: Recent Results (from the past 240 hour(s))  URINE CULTURE     Status: None   Collection Time    11/18/12  2:44 PM      Result Value Range Status   Specimen Description URINE, CATHETERIZED   Final   Special Requests NONE   Final   Culture  Setup Time     Final   Value: 11/18/2012 21:42     Performed at [facility] Count     Final   Value:  NO GROWTH     Performed at [facility]   Culture     Final   Value: NO GROWTH     Performed at [facility]   Report Status 11/19/2012 FINAL   Final  MRSA PCR SCREENING     Status: None   Collection Time    11/18/12  9:57 PM      Result Value Range Status   MRSA by PCR NEGATIVE  NEGATIVE Final   Comment:            The GeneXpert MRSA Assay (FDA     approved for NASAL specimens     only), is one component of a     comprehensive MRSA colonization     surveillance program. It is not     intended to diagnose MRSA     infection nor to guide or     monitor treatment for     MRSA infections.  CULTURE, BLOOD (ROUTINE X 2)     Status: None   Collection Time    11/18/12 10:20 PM      Result Value Range Status   Specimen Description BLOOD LEFT ANTECUBITAL   Final   Special Requests     Final   Value: BOTTLES DRAWN AEROBIC AND ANAEROBIC  AE, ANAE   Culture  Setup Time     Final   Value: 11/19/2012 05:07     Performed at [facility]   Culture     Final   Value:        BLOOD CULTURE RECEIVED NO GROWTH TO DATE CULTURE WILL BE HELD FOR 5 DAYS BEFORE ISSUING A FINAL NEGATIVE REPORT     Performed at [facility]   Report Status PENDING   Incomplete  CULTURE, BLOOD (ROUTINE X 2)     Status: None   Collection Time    11/18/12 10:30 PM      Result Value Range  Status   Specimen Description BLOOD LEFT HAND   Final   Special Requests BOTTLES DRAWN AEROBIC ONLY    Final   Culture  Setup Time     Final   Value: 11/19/2012 05:06     Performed at [facility]   Culture     Final   Value:        BLOOD CULTURE RECEIVED NO GROWTH TO DATE CULTURE WILL BE HELD FOR 5 DAYS BEFORE ISSUING A FINAL NEGATIVE REPORT     Performed at [facility]   Report Status PENDING   Incomplete     Labs: Basic Metabolic Panel:  Recent Labs Lab 11/18/12 1240 11/19/12 0310 11/20/12 0458 11/21/12 0501  NA 135 136 136 136  K 4.7 4.2 4.2 4.2  CL 98 104  104 105  CO2 27 23 24 23   GLUCOSE 218* 157* 180* 160*  BUN 18 18 18 19   CREATININE 1.37* 1.34* 1.44* 1.63*  CALCIUM 9.7 8.3* 8.7 8.7  MG  --  2.1  --  2.2  PHOS  --  3.0  --   --    Liver Function Tests:  Recent Labs Lab 11/18/12 1240 11/19/12 0310 11/21/12 0501  AST 19 14 14   ALT 10 6 9   ALKPHOS 84 59 85  BILITOT 0.9 0.8 0.3  PROT 7.3 5.6* 5.7*  ALBUMIN 3.5 2.6* 2.5*   No results found for this basename: LIPASE, AMYLASE,  in the last 168 hours No results found for this basename: AMMONIA,  in the last 168 hours CBC:  Recent Labs Lab 11/18/12 1240 11/19/12 0310 11/20/12 0458 11/21/12 0501  WBC 16.8* 11.7* 5.0 4.7  NEUTROABS 15.2*  --   --  2.6  HGB 13.5 11.0* 11.3* 10.6*  HCT 40.9 33.6* 35.5* 33.0*  MCV 90.3 90.8 92.0 91.4  PLT 142* 123* 128* 129*   Cardiac Enzymes: No results found for this basename: CKTOTAL, CKMB, CKMBINDEX, TROPONINI,  in the last 168 hours BNP: BNP (last 3 results)  Recent Labs  12/17/11 1603 01/01/12 0435 11/20/12 0458  PROBNP 2026.0* 2693.0* 1883.0*   CBG:  Recent Labs Lab 11/20/12 1153 11/20/12 1642 11/20/12 2146 11/21/12 0719 11/21/12 1208  GLUCAP 120* 153* 196* 129* 95       Signed:  Carolyne Littles, MD [facility] [phone] pager

## 2012-11-21 NOTE — Progress Notes (Signed)
ANTICOAGULATION CONSULT NOTE - Follow Up Consult  Pharmacy Consult for Warfarin Indication: Hx DVT, atrial fibrillation  Labs:  Recent Labs  11/19/12 0310 11/20/12 0458 11/20/12 1100 11/21/12 0501  HGB 11.0* 11.3*  --  10.6*  HCT 33.6* 35.5*  --  33.0*  PLT 123* 128*  --  129*  LABPROT 28.5*  --  34.2* 34.8*  INR 2.80*  --  3.55* 3.63*  CREATININE 1.34* 1.44*  --  1.63*    Assessment: 79 yo female with multiple medical problems admitted 11/3 with probable urosepsis. On chronic warfarin for afib and hx DVT - ordered to continue per Pharmacy protocol.  Pt was reportedly on Coumadin 5mg  Mon, Wed, Fri and Sat, 2.5mg  Tues, Thurs, Sun  INR further up to 3.63 despite no Coumadin last night. Hgb 10.6, Plts 129K. No bleeding. Concurrent aspirin 81mg  daily continued  Warfarin sensitivity may be increased given acute illness, broad spectrum antibiotics and decreased PO intake  Goal of Therapy:  INR 2-3   Plan:   No Coumadin tonight  Daily PT/INR  Geoffry Paradise, PharmD, BCPS Pager: [phone] 8:49 AM Pharmacy #: [id number]

## 2012-11-21 NOTE — Clinical Documentation Improvement (Signed)
Per Progr note 11/20/12.Marland KitchenMarland Kitchen"Hypotension: Secondary to sepsis. Improved with IV fluids antibiotics.  -Fluids decreased to KVO secondary to patient CHF."  For accurate Dx specificity & severity can noted "Hypotension: Secondary to sepsis"be further specified with cond being mon'd, eval'd & tx'd. Thank you  Possible Clinical Conditions? Septicemia / Sepsis Severe Sepsis  Septic Shock Bacterial infection of unknown etiology / source Other Condition  Cannot clinically Determine   Risk Factors: 11/20/12 prog note..."UTI (lower urinary tract infection): Suspect is primary source for sepsis." Signs & Symptoms:  See above note Treatent: See above note  Thank You, Toribio Harbour, RN, BSN, CCDS Certified Clinical Documentation Specialist Pager: [phone]

## 2012-11-22 NOTE — Care Management Note (Signed)
    Page 1 of 1   11/22/2012     10:17:38 AM   CARE MANAGEMENT NOTE 11/22/2012  Patient:  [name], [name]   Account Number:  [id number]  Date Initiated:  11/19/2012  Documentation initiated by:  DAVIS,RHONDA  Subjective/Objective Assessment:   79 year old female with fever, temp, and admission wbc of 16.8,     Action/Plan:   home when stable lives with daughter   Anticipated DC Date:  11/21/2012   Anticipated DC Plan:  HOME W HOME HEALTH SERVICES      DC Planning Services  CM consult      Choice offered to / List presented to:  C-4 Adult Children        HH arranged  HH-2 PT      HH agency  [facility]   Status of service:  Completed, signed off Medicare Important Message given?   (If response is "NO", the following Medicare IM given date fields will be blank) Date Medicare IM given:   Date Additional Medicare IM given:    Discharge Disposition:  HOME W HOME HEALTH SERVICES  Per UR Regulation:  Reviewed for med. necessity/level of care/duration of stay  If discussed at Long Length of Stay Meetings, dates discussed:    Comments:  11/21/12 Lanier Clam RN,BSN NCM [phone] [facility] KRISTEN REP AWARE OF D/C,& HHC ORDERS.  [id number] Earlene Plater, RN, BSN, [place] [phone] Chart Reviewed for discharge and hospital needs. Discharge needs at time of review:  None Review of patient progress due on [id number].

## 2012-11-25 ENCOUNTER — Ambulatory Visit: Payer: Medicare Other | Admitting: Pharmacist Clinician (PhC)/ Clinical Pharmacy Specialist

## 2012-11-25 LAB — CULTURE, BLOOD (ROUTINE X 2)
Culture: NO GROWTH
Culture: NO GROWTH

## 2012-11-28 ENCOUNTER — Encounter: Payer: Self-pay | Admitting: Physician Assistant

## 2012-11-28 ENCOUNTER — Ambulatory Visit (INDEPENDENT_AMBULATORY_CARE_PROVIDER_SITE_OTHER): Payer: Medicare Other | Admitting: Physician Assistant

## 2012-11-28 ENCOUNTER — Ambulatory Visit (INDEPENDENT_AMBULATORY_CARE_PROVIDER_SITE_OTHER): Payer: Medicare Other | Admitting: Pharmacist Clinician (PhC)/ Clinical Pharmacy Specialist

## 2012-11-28 VITALS — BP 120/70 | HR 73 | Ht 67.0 in | Wt 176.0 lb

## 2012-11-28 DIAGNOSIS — I10 Essential (primary) hypertension: Secondary | ICD-10-CM

## 2012-11-28 DIAGNOSIS — I4891 Unspecified atrial fibrillation: Secondary | ICD-10-CM

## 2012-11-28 DIAGNOSIS — I5032 Chronic diastolic (congestive) heart failure: Secondary | ICD-10-CM

## 2012-11-28 DIAGNOSIS — I2510 Atherosclerotic heart disease of native coronary artery without angina pectoris: Secondary | ICD-10-CM

## 2012-11-28 DIAGNOSIS — I82409 Acute embolism and thrombosis of unspecified deep veins of unspecified lower extremity: Secondary | ICD-10-CM

## 2012-11-28 DIAGNOSIS — Z7901 Long term (current) use of anticoagulants: Secondary | ICD-10-CM

## 2012-11-28 NOTE — Assessment & Plan Note (Signed)
On coumadin 

## 2012-11-28 NOTE — Patient Instructions (Signed)
1.  Follow up with Dr. Croitoru in 3 months. 

## 2012-11-28 NOTE — Assessment & Plan Note (Signed)
Controlled at this time

## 2012-11-28 NOTE — Progress Notes (Signed)
Date:  11/28/2012   ID:  [name], DOB 06/23/1920, MRN [id number]  PCP:  August Saucer [name], MD  Primary Cardiologist:  Croitoru     History of Present Illness: [name] is a 79 y.o. female  who is nonambulatory and has a history of coronary disease, previous bypass surgery, permanent pacemaker for high-grade AV block, paroxysmal atrial fibrillation, recent upper extremity deep venous thrombosis associated with a venous catheter and recurrent problems with severe edema due to gastrointestinal bleeding. She is on coumadin and her last INR was 2.5. Recurrent bleeding, requiring transfusions, was associated with both antiplatelet and anticoagulant therapy. She had EGD in December of 2013 which showed some gastric erosions and ulcer. Antiplatelets have been discontinued but she is back on warfarin secondary to her upper extremity DVT.  INR today is 2.2.  The patient was recently hospitalized with sepsis and UTI.  She feels better now.Her weight has decreased from 205 to 176 #.  She has some chronic adb pain and SOB is at baseline.She denies any reoccurrence of chills which brought her to the hospital in the first place.  She also denies nausea, vomiting, fever, chest pain, shortness of breath, orthopnea, dizziness, PND, cough, congestion, hematochezia, melena.  Wt Readings from Last 3 Encounters:  11/28/12 176 lb (79.833 kg)  11/20/12 205 lb 11 oz (93.3 kg)  10/09/12 197 lb 4.8 oz (89.495 kg)     Past Medical History  Diagnosis Date  . Hypertension   . Coronary artery disease   . Legally blind   . Chronic kidney disease   . Anemia   . Hypothyroid   . Gastric ulcer   . Hiatal hernia   . Dyspepsia   . UTI (lower urinary tract infection)   . H/O: GI bleed 12//13  . Diastolic dysfunction   . Pacemaker   . High cholesterol   . CHF (congestive heart failure)   . Anginal pain   . MI (myocardial infarction) ? 1994  . DVT of upper extremity (deep vein thrombosis) 06/13/2012      BUE  . Seasonal allergies   . Allergy to perfume     "strong perfumes only" (06/13/2012)  . Shortness of breath     "can happen at any time" (06/13/2012)  . Type II diabetes mellitus   . History of blood transfusion 2013    "not related to OR; BP had dropped" (06/13/2012)  . GERD (gastroesophageal reflux disease)   . Daily headache   . Arthritis     "all over" (06/13/2012)  . A-fib   . 2Nd degree atrioventricular block     Current Outpatient Prescriptions  Medication Sig Dispense Refill  . ACCU-CHEK FASTCLIX LANCETS MISC 2 (two) times daily.      Marland Kitchen acetaminophen-codeine (TYLENOL #3) 300-30 MG per tablet Take 1 tablet by mouth 2 (two) times daily as needed for pain.      Marland Kitchen aspirin 81 MG chewable tablet Chew 81 mg by mouth daily.      . Blood Glucose Monitoring Suppl (ACCU-CHEK AVIVA PLUS) W/DEVICE KIT 1 each by Does not apply route as needed.  1 kit  0  . feeding supplement, GLUCERNA SHAKE, (GLUCERNA SHAKE) LIQD Take 237 mLs by mouth 2 (two) times daily between meals.      . folic acid (FOLVITE) 1 MG tablet Take 1 mg by mouth daily.      Marland Kitchen gabapentin (NEURONTIN) 100 MG capsule Take 100 mg by mouth 2 (two) times daily.       Marland Kitchen  glucose blood (ACCU-CHEK AVIVA PLUS) test strip Use to check blood sugars 2 times daily dx code 250.00  200 each  3  . isosorbide mononitrate (IMDUR) 30 MG 24 hr tablet Take 15 mg by mouth daily.      Marland Kitchen levothyroxine (SYNTHROID, LEVOTHROID) 50 MCG tablet Take 50 mcg by mouth daily before breakfast.      . metoprolol tartrate (LOPRESSOR) 25 MG tablet Take 12.5 mg by mouth 2 (two) times daily.      . nitroGLYCERIN (NITRODUR - DOSED IN MG/24 HR) 0.2 mg/hr patch Place 1 patch onto the skin daily as needed. Pt does not wear all the time      . pantoprazole (PROTONIX) 40 MG tablet Take 40 mg by mouth 2 (two) times daily.      . rosuvastatin (CRESTOR) 10 MG tablet Take 10 mg by mouth daily.      Marland Kitchen tobramycin (TOBREX) 0.3 % ophthalmic solution       . traMADol (ULTRAM)  50 MG tablet       . Vitamin D, Ergocalciferol, (DRISDOL) 50000 UNITS CAPS capsule Take 50,000 Units by mouth every Monday.      . warfarin (COUMADIN) 5 MG tablet Take 0.5 tablets (2.5 mg total) by mouth daily. 2.5 mg by mouth daily  30 tablet  0  . zolpidem (AMBIEN) 10 MG tablet Take 5 mg by mouth at bedtime as needed. sleep       No current facility-administered medications for this visit.    Allergies:    Allergies  Allergen Reactions  . Darvon   . Digoxin And Related Nausea Only  . Nitrostat [Nitroglycerin]   . Penicillins Other (See Comments)    Was told had allergy from childhood... Unknown reaction  . Percocet [Oxycodone-Acetaminophen]     confused  . Percodan [Oxycodone-Aspirin]   . Vicodin [Hydrocodone-Acetaminophen]     Social History:  The patient  reports that she has never smoked. She has never used smokeless tobacco. She reports that she does not drink alcohol or use illicit drugs.   Family history:   Family History  Problem Relation Age of Onset  . Breast cancer Daughter   . Diabetes Son   . Diabetes Daughter   . Heart disease Father   . Diabetes Father   . Colon polyps Daughter   . Heart attack Brother   . Diabetes Brother   . Stroke Sister     ROS:  Please see the history of present illness.  All other systems reviewed and negative.   PHYSICAL EXAM: VS:  BP 120/70  Pulse 73  Ht 5\' 7"  (1.702 m)  Wt 176 lb (79.833 kg)  BMI 27.56 kg/m2 Obese, well developed, in no acute distress Neck: no carotid bruits.  Neck: no JVDNo cervical lymphadenopathy. Cardiac: Regular rate and rhythm without murmurs rubs or gallops. Lungs:  clear to auscultation bilaterally, no wheezing, rhonchi or rales Abd: soft, obese, Mildly tender in left and right UQ, positive bowel sounds all quadrants, Ext: 1+ lower extremity edema.  2+ radial and dorsalis pedis pulses. Skin: warm and dry Neuro:  Grossly normal  EKG:  V-Pacing 73 bpm     ASSESSMENT AND PLAN:  Problem List  Items Addressed This Visit   Hypertension     Controlled at this time.    Chronic diastolic heart failure     Currently well compensated.  Some mild LEE which improves by the morning.  Weight is down 29# from weight on 11/20/12 and 21#  from 10/09/12.  No changes to current therapy.    A-fib (Chronic)    Other Visit Diagnoses   CAD (coronary artery disease)    -  Primary    Relevant Orders       EKG 12-Lead

## 2012-11-28 NOTE — Assessment & Plan Note (Signed)
Currently well compensated.  Some mild LEE which improves by the morning.  Weight is down 29# from weight on 11/20/12 and 21# from 10/09/12.  No changes to current therapy.

## 2013-01-20 ENCOUNTER — Ambulatory Visit (INDEPENDENT_AMBULATORY_CARE_PROVIDER_SITE_OTHER): Payer: Medicare Other | Admitting: Pharmacist Clinician (PhC)/ Clinical Pharmacy Specialist

## 2013-01-20 VITALS — BP 122/70 | HR 72

## 2013-01-20 DIAGNOSIS — I82409 Acute embolism and thrombosis of unspecified deep veins of unspecified lower extremity: Secondary | ICD-10-CM

## 2013-01-20 DIAGNOSIS — Z7901 Long term (current) use of anticoagulants: Secondary | ICD-10-CM

## 2013-01-20 LAB — POCT INR: INR: 7.9

## 2013-01-24 ENCOUNTER — Ambulatory Visit (INDEPENDENT_AMBULATORY_CARE_PROVIDER_SITE_OTHER): Payer: Medicare Other | Admitting: Endocrinology

## 2013-01-24 ENCOUNTER — Other Ambulatory Visit: Payer: Self-pay | Admitting: *Deleted

## 2013-01-24 ENCOUNTER — Encounter: Payer: Self-pay | Admitting: Endocrinology

## 2013-01-24 VITALS — BP 126/76 | HR 73 | Temp 98.2°F | Resp 14 | Wt 268.0 lb

## 2013-01-24 DIAGNOSIS — E039 Hypothyroidism, unspecified: Secondary | ICD-10-CM

## 2013-01-24 DIAGNOSIS — IMO0001 Reserved for inherently not codable concepts without codable children: Secondary | ICD-10-CM

## 2013-01-24 DIAGNOSIS — R609 Edema, unspecified: Secondary | ICD-10-CM

## 2013-01-24 DIAGNOSIS — E1165 Type 2 diabetes mellitus with hyperglycemia: Principal | ICD-10-CM

## 2013-01-24 LAB — COMPREHENSIVE METABOLIC PANEL
ALBUMIN: 3.4 g/dL — AB (ref 3.5–5.2)
ALT: 8 U/L (ref 0–35)
AST: 15 U/L (ref 0–37)
Alkaline Phosphatase: 60 U/L (ref 39–117)
BUN: 20 mg/dL (ref 6–23)
CO2: 28 meq/L (ref 19–32)
Calcium: 9.1 mg/dL (ref 8.4–10.5)
Chloride: 107 mEq/L (ref 96–112)
Creatinine, Ser: 1.2 mg/dL (ref 0.4–1.2)
GFR: 52.19 mL/min — AB (ref >60.00)
Glucose, Bld: 139 mg/dL — ABNORMAL HIGH (ref 70–99)
POTASSIUM: 4 meq/L (ref 3.5–5.1)
Sodium: 140 mEq/L (ref 135–145)
Total Bilirubin: 0.9 mg/dL (ref 0.3–1.2)
Total Protein: 6.5 g/dL (ref 6.0–8.3)

## 2013-01-24 LAB — LIPID PANEL
CHOLESTEROL: 170 mg/dL (ref 0–200)
HDL: 42.5 mg/dL (ref >39.00)
LDL Cholesterol: 95 mg/dL (ref 0–99)
Total CHOL/HDL Ratio: 4
Triglycerides: 165 mg/dL — ABNORMAL HIGH (ref 0.0–149.0)
VLDL: 33 mg/dL (ref 0.0–40.0)

## 2013-01-24 LAB — TSH: TSH: 1.64 u[IU]/mL (ref 0.35–5.50)

## 2013-01-24 LAB — T4, FREE: Free T4: 0.93 ng/dL (ref 0.60–1.60)

## 2013-01-24 LAB — HEMOGLOBIN A1C: Hgb A1c MFr Bld: 7.8 % — ABNORMAL HIGH (ref 4.6–6.5)

## 2013-01-24 MED ORDER — GLIPIZIDE 2.5 MG HALF TABLET: 2.5000 mg | ORAL_TABLET | Freq: Every day | ORAL | Status: DC

## 2013-01-24 NOTE — Patient Instructions (Signed)
No insulin  Glipizide 5mg , 1/2 tab before supper; may have lemonade at supper

## 2013-01-24 NOTE — Progress Notes (Signed)
Patient ID: [name], female   DOB: 1920-09-02, 80 y.o.   MRN: [id number]  Reason for Appointment: Diabetes follow-up    History of Present Illness   Diagnosis: Type 2 diabetes mellitus, date of diagnosis: 1972.   She had been on insulin for several years and this had been tapered off in 2013 because of weight loss and lower blood sugars..  Because of her age and multiple medical problems she has been told not to take any insulin because of only modestly high readings  Recent history: Her blood sugars are still looking fairly good overall despite not being on any hypoglycemic agents She is checking her blood sugars frequently, occasionally 4 times a day Blood sugars are tending to be relatively higher in the evenings after her meals Her daughter says that she gets her 5 units of NovoLog after supper if the blood sugar goes up over 200; has not had any hypoglycemia with this Her A1c was 8% in November Her weight is difficult to assess because of her instability and not clear, how much weight she has gained  Monitors blood glucose: 3.6 times daily.  Glucometer: Accucheck Aviva.  Blood Glucose readings: FASTING: 97-142, nonfasting 104-242, sometimes checking several times in a row and difficult to assess average Average blood sugar in the evenings about 170  Hypoglycemia frequency: None .  Meals: 2 meals at noon and 7 pm; sometimes has lemonade sprite. Has Glucerna at dinner Physical activity: exercise:none.  Dietician visit: Most recent:2001.   Complications: peripheral neuropathy. having some pains in her feet on and off .  Urine microalbumin normal previously  Wt Readings from Last 3 Encounters:  01/24/13 268 lb (121.564 kg)  11/28/12 176 lb (79.833 kg)  11/20/12 205 lb 11 oz (93.3 kg)       Medication List       This list is accurate as of: 01/24/13 11:39 AM.  Always use your most recent med list.               ACCU-CHEK AVIVA PLUS W/DEVICE Kit  1 each by  Does not apply route as needed.     ACCU-CHEK FASTCLIX LANCETS Misc  2 (two) times daily.     acetaminophen-codeine 300-30 MG per tablet  Commonly known as:  TYLENOL #3  Take 1 tablet by mouth 2 (two) times daily as needed for pain.     aspirin 81 MG chewable tablet  Chew 81 mg by mouth daily.     feeding supplement (GLUCERNA SHAKE) Liqd  Take 237 mLs by mouth 2 (two) times daily between meals.     folic acid 1 MG tablet  Commonly known as:  FOLVITE  Take 1 mg by mouth daily.     gabapentin 100 MG capsule  Commonly known as:  NEURONTIN  Take 100 mg by mouth 2 (two) times daily.     glucose blood test strip  Commonly known as:  ACCU-CHEK AVIVA PLUS  Use to check blood sugars 2 times daily dx code 250.00     isosorbide mononitrate 30 MG 24 hr tablet  Commonly known as:  IMDUR  Take 15 mg by mouth daily.     levothyroxine 50 MCG tablet  Commonly known as:  SYNTHROID, LEVOTHROID  Take 50 mcg by mouth daily before breakfast.     metoprolol tartrate 25 MG tablet  Commonly known as:  LOPRESSOR  Take 12.5 mg by mouth 2 (two) times daily.     nitroGLYCERIN 0.2 mg/hr patch  Commonly known as:  NITRODUR - Dosed in mg/24 hr  Place 1 patch onto the skin daily as needed. Pt does not wear all the time     pantoprazole 40 MG tablet  Commonly known as:  PROTONIX  Take 40 mg by mouth 2 (two) times daily.     rosuvastatin 10 MG tablet  Commonly known as:  CRESTOR  Take 10 mg by mouth daily.     tobramycin 0.3 % ophthalmic solution  Commonly known as:  TOBREX     traMADol 50 MG tablet  Commonly known as:  ULTRAM     Vitamin D (Ergocalciferol) 50000 UNITS Caps capsule  Commonly known as:  DRISDOL  Take 50,000 Units by mouth every Monday.     warfarin 5 MG tablet  Commonly known as:  COUMADIN  Take 0.5 tablets (2.5 mg total) by mouth daily. 2.5 mg by mouth daily     zolpidem 10 MG tablet  Commonly known as:  AMBIEN  Take 5 mg by mouth at bedtime as needed. sleep         Past Medical History  Diagnosis Date  . Hypertension   . Coronary artery disease   . Legally blind   . Chronic kidney disease   . Anemia   . Hypothyroid   . Gastric ulcer   . Hiatal hernia   . Dyspepsia   . UTI (lower urinary tract infection)   . H/O: GI bleed 12//13  . Diastolic dysfunction   . Pacemaker   . High cholesterol   . CHF (congestive heart failure)   . Anginal pain   . MI (myocardial infarction) ? 1994  . DVT of upper extremity (deep vein thrombosis) 06/13/2012    BUE  . Seasonal allergies   . Allergy to perfume     "strong perfumes only" (06/13/2012)  . Shortness of breath     "can happen at any time" (06/13/2012)  . Type II diabetes mellitus   . History of blood transfusion 2013    "not related to OR; BP had dropped" (06/13/2012)  . GERD (gastroesophageal reflux disease)   . Daily headache   . Arthritis     "all over" (06/13/2012)  . A-fib   . 2Nd degree atrioventricular block     Past Surgical History  Procedure Laterality Date  . Esophagogastroduodenoscopy  12/22/2011    Procedure: ESOPHAGOGASTRODUODENOSCOPY (EGD);  Surgeon: Gatha Mayer, MD;  Location: [facility];  Service: Endoscopy;  Laterality: N/A;  . Cardioversion  06/06/06    successful  . Insert / replace / remove pacemaker  06/29/2006    Medtronic adapta  . Cardiac catheterization  10/25/92  . Cardiac catheterization  11/18/03    w/grafts 100%CX LAD 80 & 100%  . Cardiac catheterization  01/24/05    diffuse disease of native vessels  . Cardiac catheterization  06/06/06    severe native CAD  . Coronary artery bypass graft  10/27/92    LIMA to LAD,SVG to LAD second diagonal,obtuse maraginal of the CX and posterior descendingbranch of the RCA  . Cataract extraction w/ intraocular lens  implant, bilateral Bilateral   . Refractive surgery Bilateral     Family History  Problem Relation Age of Onset  . Breast cancer Daughter   . Diabetes Son   . Diabetes Daughter   . Heart disease Father    . Diabetes Father   . Colon polyps Daughter   . Heart attack Brother   . Diabetes Brother   .  Stroke Sister     Social History:  reports that she has never smoked. She has never used smokeless tobacco. She reports that she does not drink alcohol or use illicit drugs.  Allergies:  Allergies  Allergen Reactions  . Darvon   . Digoxin And Related Nausea Only  . Nitrostat [Nitroglycerin]   . Penicillins Other (See Comments)    Was told had allergy from childhood... Unknown reaction  . Percocet [Oxycodone-Acetaminophen]     confused  . Percodan [Oxycodone-Aspirin]   . Vicodin [Hydrocodone-Acetaminophen]     REVIEW of systems:  Hypothyroidism: She is taking only 50 mcg daily, no recent thyroid levels available  She has had renal dysfunction which is long-standing  Has had some increase in pedal edema. Does wear elastic stockings, she does not think she is taking diuretics  She has significant neuropathy with sensory loss as on her recent exam.    Examination:   BP 126/76  Pulse 73  Temp(Src) 98.2 F (36.8 C)  Resp 14  Wt 268 lb (121.564 kg)  SpO2 94%  Body mass index is 41.96 kg/(m^2).   Lower legs appear edematous, 1-2+ Diabetic foot exam was done on 10/18/12  Assesment/plan:   1. Diabetes type 2, long-standing and previously on insulin. Currently she is still doing fairly well without insulin although she appears to be gaining weight significantly. Overall tends to be having high readings after meals  Because of her age her low sugars do not need to be controlled aggressively. However the patient and family are concerned about readings over 200 and she would really like to be liberal with her diet including drinking lemonade at meals. Discussed that we can have her take a small dose of glipizide, 2.5 mg before supper and have her use a liberal diet including drinks with sugar. She will call if she has any difficulties with this  We'll need to evaluate her A1c  periodically to assess overall control also. Discussed need for some readings after meals also which her daughter can check. She is on a fairly liberal diet and still likes to drink some drinks with sugar. Considering her age and multiple medical problems she is doing well with no drug for diabetes. She is overall still watching portions  She will continue to monitor her blood sugars periodically at home and call if persistently. May consider low-dose Januvia if having increasing hyperglycemia  2. Hypothyroidism: She has been on a lower dose than usual for some time and TSH  will be reassessed  3. CKD: Creatinine was 1.1 in July,  will repeat   [facility] 01/24/2013, 11:39 AM   Labs as follows: A1c 7.8, otherwise unremarkable  Office Visit on 01/24/2013  Component Date Value Range Status  . Sodium 01/24/2013 140  135 - 145 mEq/L Final  . Potassium 01/24/2013 4.0  3.5 - 5.1 mEq/L Final  . Chloride 01/24/2013 107  96 - 112 mEq/L Final  . CO2 01/24/2013 28  19 - 32 mEq/L Final  . Glucose, Bld 01/24/2013 139* 70 - 99 mg/dL Final  . BUN 01/24/2013 20  6 - 23 mg/dL Final  . Creatinine, Ser 01/24/2013 1.2  0.4 - 1.2 mg/dL Final  . Total Bilirubin 01/24/2013 0.9  0.3 - 1.2 mg/dL Final  . Alkaline Phosphatase 01/24/2013 60  39 - 117 U/L Final  . AST 01/24/2013 15  0 - 37 U/L Final  . ALT 01/24/2013 8  0 - 35 U/L Final  . Total Protein 01/24/2013 6.5  6.0 - 8.3 g/dL Final  . Albumin 01/24/2013 3.4* 3.5 - 5.2 g/dL Final  . Calcium 01/24/2013 9.1  8.4 - 10.5 mg/dL Final  . GFR 01/24/2013 52.19* >60.00 mL/min Final  . Hemoglobin A1C 01/24/2013 7.8* 4.6 - 6.5 % Final   Glycemic Control Guidelines for People with Diabetes:Non Diabetic:  <6%Goal of Therapy: <7%Additional Action Suggested:  >8%   . TSH 01/24/2013 1.64  0.35 - 5.50 uIU/mL Final  . Free T4 01/24/2013 0.93  0.60 - 1.60 ng/dL Final  . Cholesterol 01/24/2013 170  0 - 200 mg/dL Final   ATP III Classification       Desirable:  < 200 mg/dL                Borderline High:  200 - 239 mg/dL          High:  > = 240 mg/dL  . Triglycerides 01/24/2013 165.0* 0.0 - 149.0 mg/dL Final   Normal:  <150 mg/dLBorderline High:  150 - 199 mg/dL  . HDL 01/24/2013 42.50  >39.00 mg/dL Final  . VLDL 01/24/2013 33.0  0.0 - 40.0 mg/dL Final  . LDL Cholesterol 01/24/2013 95  0 - 99 mg/dL Final  . Total CHOL/HDL Ratio 01/24/2013 4   Final                  Men          Women1/2 Average Risk     3.4          3.3Average Risk          5.0          4.42X Average Risk          9.6          7.13X Average Risk          15.0          11.0

## 2013-01-27 ENCOUNTER — Ambulatory Visit: Payer: Medicare Other | Admitting: Pharmacist Clinician (PhC)/ Clinical Pharmacy Specialist

## 2013-01-28 ENCOUNTER — Ambulatory Visit (INDEPENDENT_AMBULATORY_CARE_PROVIDER_SITE_OTHER): Payer: Medicare Other | Admitting: Pharmacist Clinician (PhC)/ Clinical Pharmacy Specialist

## 2013-01-28 ENCOUNTER — Ambulatory Visit (INDEPENDENT_AMBULATORY_CARE_PROVIDER_SITE_OTHER): Payer: Medicare Other | Admitting: Cardiovascular Disease

## 2013-01-28 ENCOUNTER — Encounter: Payer: Self-pay | Admitting: Cardiovascular Disease

## 2013-01-28 VITALS — BP 110/68 | HR 64 | Resp 16 | Ht 64.0 in | Wt 192.6 lb

## 2013-01-28 DIAGNOSIS — Z7901 Long term (current) use of anticoagulants: Secondary | ICD-10-CM

## 2013-01-28 DIAGNOSIS — I4891 Unspecified atrial fibrillation: Secondary | ICD-10-CM

## 2013-01-28 DIAGNOSIS — I82409 Acute embolism and thrombosis of unspecified deep veins of unspecified lower extremity: Secondary | ICD-10-CM

## 2013-01-28 DIAGNOSIS — Z950 Presence of cardiac pacemaker: Secondary | ICD-10-CM

## 2013-01-28 DIAGNOSIS — I2510 Atherosclerotic heart disease of native coronary artery without angina pectoris: Secondary | ICD-10-CM

## 2013-01-28 LAB — MDC_IDC_ENUM_SESS_TYPE_INCLINIC
Battery Impedance: 1821 Ohm
Battery Voltage: 2.74 V
Brady Statistic AP VP Percent: 33.6 %
Brady Statistic AP VS Percent: 0.1 % — CL
Brady Statistic AS VS Percent: 0.4 %
Lead Channel Impedance Value: 637 Ohm
Lead Channel Pacing Threshold Amplitude: 0.5 V
Lead Channel Pacing Threshold Amplitude: 0.75 V
Lead Channel Sensing Intrinsic Amplitude: 2 mV
Lead Channel Setting Pacing Amplitude: 1.75 V
Lead Channel Setting Pacing Amplitude: 2 V
Lead Channel Setting Pacing Pulse Width: 0.4 ms
MDC IDC MSMT LEADCHNL RA IMPEDANCE VALUE: 443 Ohm
MDC IDC MSMT LEADCHNL RA PACING THRESHOLD PULSEWIDTH: 0.4 ms
MDC IDC MSMT LEADCHNL RV PACING THRESHOLD PULSEWIDTH: 0.4 ms
MDC IDC SET LEADCHNL RV SENSING SENSITIVITY: 2.8 mV
MDC IDC STAT BRADY AS VP PERCENT: 66 %

## 2013-01-28 LAB — PACEMAKER DEVICE OBSERVATION

## 2013-01-28 LAB — POCT INR: INR: 1.8

## 2013-01-28 NOTE — Patient Instructions (Signed)
Please take your medications everyday as prescribed by your physician. Please remember to wear your NTG patch.  Your physician recommends that you schedule a follow-up appointment in: 3 months for a pacemaker check and in 6 months with Dr.Croitoru + pacemaker check

## 2013-01-29 ENCOUNTER — Other Ambulatory Visit: Payer: Self-pay | Admitting: Cardiovascular Disease

## 2013-01-29 NOTE — Telephone Encounter (Signed)
Rx was sent to pharmacy electronically. 

## 2013-02-05 ENCOUNTER — Ambulatory Visit (INDEPENDENT_AMBULATORY_CARE_PROVIDER_SITE_OTHER): Payer: Medicare Other | Admitting: Pharmacist Clinician (PhC)/ Clinical Pharmacy Specialist

## 2013-02-05 ENCOUNTER — Encounter: Payer: Self-pay | Admitting: Cardiovascular Disease

## 2013-02-05 VITALS — BP 112/66 | HR 60

## 2013-02-05 DIAGNOSIS — Z7901 Long term (current) use of anticoagulants: Secondary | ICD-10-CM

## 2013-02-05 DIAGNOSIS — I82409 Acute embolism and thrombosis of unspecified deep veins of unspecified lower extremity: Secondary | ICD-10-CM

## 2013-02-05 LAB — POCT INR: INR: 2.8

## 2013-02-05 NOTE — Assessment & Plan Note (Signed)
Asymptomatic and brief. No change in meds

## 2013-02-05 NOTE — Assessment & Plan Note (Signed)
Pacemaker check in clinic. Normal device function. Thresholds, sensing, impedances consistent with previous measurements. Device programmed to maximize longevity. 19 mode switches (<0.1%)---1 AHR x 14 mins 254/80---AFib. No high ventricular  rates noted. Outputs increased to 2V/2.5V for greater safety margin. Histogram distribution appropriate for patient activity level. Device programmed to optimize intrinsic conduction. Estimated longevity 2.5 years. Patient will follow up with the device  clinic in 3 months and with MD in 6 months.

## 2013-02-05 NOTE — Assessment & Plan Note (Signed)
Occasional chest discomfort seems to correlate with not wearing her NTG patch

## 2013-02-05 NOTE — Progress Notes (Signed)
Patient ID: [name], female   DOB: 11-09-1920, 80 y.o.   MRN: [id number]     Reason for office visit CAD, AFib, pacemaker check  [name] has numerous cardiac problems including paroxysmal atrial fibrillation, as well as DVT and PE for which she receives anticoagulation, although this has had to be repeatedly stopped secondary to recurrent severe gastrointestinal bleeding. She has coronary disease and underwent bypass surgery 20 years ago. She has always had preserved left extrasystolic function. She has a dual-chamber permanent pacemaker in place. She is morbidly obese and extremely sedentary. 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  Her pacemaker function is normal. On interrogation today she has only had one 15 minute episode of AF in the last 3 months. She denies any change in her chronic mild shortness of breath and has not expressed any chest pain. Visual problems seem to have returned to baseline. No new complaints.    Allergies  Allergen Reactions  . Darvon   . Digoxin And Related Nausea Only  . Penicillins Other (See Comments)    Was told had allergy from childhood... Unknown reaction  . Percocet [Oxycodone-Acetaminophen]     confused  . Percodan [Oxycodone-Aspirin]   . Vicodin [Hydrocodone-Acetaminophen]     Current Outpatient Prescriptions  Medication Sig Dispense Refill  . ACCU-CHEK FASTCLIX LANCETS MISC 2 (two) times daily.      Marland Kitchen acetaminophen-codeine (TYLENOL #3) 300-30 MG per tablet Take 1 tablet by mouth 2 (two) times daily as needed for pain.      Marland Kitchen aspirin 81 MG chewable tablet Chew 81 mg by mouth daily.      . Blood Glucose Monitoring Suppl (ACCU-CHEK AVIVA PLUS) W/DEVICE KIT 1 each by Does not apply route as needed.  1 kit  0  . feeding supplement, GLUCERNA SHAKE, (GLUCERNA SHAKE) LIQD Take 237 mLs by mouth 2 (two) times daily between meals.      . gabapentin (NEURONTIN) 100 MG capsule Take 100 mg by  mouth 2 (two) times daily.       Marland Kitchen glipiZIDE (GLUCOTROL) 2.5 mg TABS tablet Take 0.5 tablets (2.5 mg total) by mouth daily before supper.  30 tablet  3  . glucose blood (ACCU-CHEK AVIVA PLUS) test strip Use to check blood sugars 2 times daily dx code 250.00  200 each  3  . isosorbide mononitrate (IMDUR) 30 MG 24 hr tablet Take 15 mg by mouth daily.      Marland Kitchen levothyroxine (SYNTHROID, LEVOTHROID) 50 MCG tablet Take 50 mcg by mouth daily before breakfast.      . metoprolol tartrate (LOPRESSOR) 25 MG tablet Take 12.5 mg by mouth 2 (two) times daily.      . nitroGLYCERIN (NITRODUR - DOSED IN MG/24 HR) 0.2 mg/hr patch Place 1 patch onto the skin daily as needed. Pt does not wear all the time      . pantoprazole (PROTONIX) 40 MG tablet Take 40 mg by mouth 2 (two) times daily.      . rosuvastatin (CRESTOR) 10 MG tablet Take 10 mg by mouth daily.      Marland Kitchen tobramycin (TOBREX) 0.3 % ophthalmic solution       . traMADol (ULTRAM) 50 MG tablet       . Vitamin D, Ergocalciferol, (DRISDOL) 50000 UNITS CAPS capsule Take 50,000 Units by mouth every Monday.      . warfarin (COUMADIN) 5 MG tablet Take 0.5 tablets (2.5 mg total) by  mouth daily. 2.5 mg by mouth daily  30 tablet  0  . zolpidem (AMBIEN) 10 MG tablet Take 5 mg by mouth at bedtime as needed. sleep      . folic acid (FOLVITE) 1 MG tablet take 1 tablet by mouth once daily  30 tablet  11   No current facility-administered medications for this visit.    Past Medical History  Diagnosis Date  . Hypertension   . Coronary artery disease   . Legally blind   . Chronic kidney disease   . Anemia   . Hypothyroid   . Gastric ulcer   . Hiatal hernia   . Dyspepsia   . UTI (lower urinary tract infection)   . H/O: GI bleed 12//13  . Diastolic dysfunction   . Pacemaker   . High cholesterol   . CHF (congestive heart failure)   . Anginal pain   . MI (myocardial infarction) ? 1994  . DVT of upper extremity (deep vein thrombosis) 06/13/2012    BUE  . Seasonal  allergies   . Allergy to perfume     "strong perfumes only" (06/13/2012)  . Shortness of breath     "can happen at any time" (06/13/2012)  . Type II diabetes mellitus   . History of blood transfusion 2013    "not related to OR; BP had dropped" (06/13/2012)  . GERD (gastroesophageal reflux disease)   . Daily headache   . Arthritis     "all over" (06/13/2012)  . A-fib   . 2Nd degree atrioventricular block     Past Surgical History  Procedure Laterality Date  . Esophagogastroduodenoscopy  12/22/2011    Procedure: ESOPHAGOGASTRODUODENOSCOPY (EGD);  Surgeon: Gatha Mayer, MD;  Location: [facility];  Service: Endoscopy;  Laterality: N/A;  . Cardioversion  06/06/06    successful  . Insert / replace / remove pacemaker  06/29/2006    Medtronic adapta  . Cardiac catheterization  10/25/92  . Cardiac catheterization  11/18/03    w/grafts 100%CX LAD 80 & 100%  . Cardiac catheterization  01/24/05    diffuse disease of native vessels  . Cardiac catheterization  06/06/06    severe native CAD  . Coronary artery bypass graft  10/27/92    LIMA to LAD,SVG to LAD second diagonal,obtuse maraginal of the CX and posterior descendingbranch of the RCA  . Cataract extraction w/ intraocular lens  implant, bilateral Bilateral   . Refractive surgery Bilateral     Family History  Problem Relation Age of Onset  . Breast cancer Daughter   . Diabetes Son   . Diabetes Daughter   . Heart disease Father   . Diabetes Father   . Colon polyps Daughter   . Heart attack Brother   . Diabetes Brother   . Stroke Sister     History   Social History  . Marital Status: Married    Spouse Name: N/A    Number of Children: 41  . Years of Education: N/A   Occupational History  .     Social History Main Topics  . Smoking status: Never Smoker   . Smokeless tobacco: Never Used  . Alcohol Use: No  . Drug Use: No  . Sexual Activity: No   Other Topics Concern  . Not on file   Social History Narrative  . No  narrative on file    Review of systems: Mild edema in the ankles bilaterally in a symmetrical pattern  The patient specifically denies any dyspnea  at rest or change in mild dyspnea with exertion, orthopnea, paroxysmal nocturnal dyspnea, syncope, palpitations, focal neurological deficits, intermittent claudication, unexplained weight gain, cough, hemoptysis or wheezing.  The patient also denies abdominal pain, nausea, vomiting, dysphagia, diarrhea, constipation, polyuria, polydipsia, dysuria, hematuria, frequency, urgency, abnormal bleeding or bruising, fever, chills, unexpected weight changes, mood swings, change in skin or hair texture, change in voice quality, auditory or visual problems, allergic reactions or rashes, new musculoskeletal complaints other than usual "aches and pains".   PHYSICAL EXAM BP 110/68  Pulse 64  Resp 16  Ht $R'5\' 4"'vt$  (1.626 m)  Wt 87.363 kg (192 lb 9.6 oz)  BMI 33.04 kg/m2 General: Alert, oriented x3, no distress, moderately obese  Head: no evidence of trauma, PERRL, EOMI, no exophtalmos or lid lag, no myxedema, no xanthelasma; normal ears, nose and oropharynx  Neck: normal jugular venous pulsations and no hepatojugular reflux; brisk carotid pulses without delay and no carotid bruits  Chest: clear to auscultation, no signs of consolidation by percussion or palpation, normal fremitus, symmetrical and full respiratory excursions  Cardiovascular: normal position and quality of the apical impulse, regular rhythm, normal first and second heart sounds, no murmurs, rubs or gallops  Abdomen: no tenderness or distention, no masses by palpation, no abnormal pulsatility or arterial bruits, normal bowel sounds, no hepatosplenomegaly  Extremities: no clubbing, cyanosis; 1+ symmetrical pitting ankle edema; 2+ radial, ulnar and brachial pulses bilaterally; beneath the femoral level it is hard to palpate any pulses in her lower showed; no subclavian or femoral bruits  Neurological:  grossly nonfocal, poor vision   EKG: A sensed V paced (Device chesk Apaced 33, V paced100%)  Lipid Panel     Component Value Date/Time   CHOL 170 01/24/2013 1202   TRIG 165.0* 01/24/2013 1202   HDL 42.50 01/24/2013 1202   CHOLHDL 4 01/24/2013 1202   VLDL 33.0 01/24/2013 1202   LDLCALC 95 01/24/2013 1202    BMET    Component Value Date/Time   NA 140 01/24/2013 1202   K 4.0 01/24/2013 1202   CL 107 01/24/2013 1202   CO2 28 01/24/2013 1202   GLUCOSE 139* 01/24/2013 1202   BUN 20 01/24/2013 1202   CREATININE 1.2 01/24/2013 1202   CALCIUM 9.1 01/24/2013 1202   GFRNONAA 27* 11/21/2012 0501   GFRAA 31* 11/21/2012 0501     ASSESSMENT AND PLAN Pacemaker, medtronic Adapta 06/29/06  secondary to symptomatic bradycardia and 2nd degree heart block Pacemaker check in clinic. Normal device function. Thresholds, sensing, impedances consistent with previous measurements. Device programmed to maximize longevity. 19 mode switches (<0.1%)---1 AHR x 14 mins 254/80---AFib. No high ventricular  rates noted. Outputs increased to 2V/2.5V for greater safety margin. Histogram distribution appropriate for patient activity level. Device programmed to optimize intrinsic conduction. Estimated longevity 2.5 years. Patient will follow up with the device  clinic in 3 months and with MD in 6 months.   A-fib Asymptomatic and brief. No change in meds  Coronary artery disease, CABG in 1994 with last cath 2008 patent grafts and normal EF Occasional chest discomfort seems to correlate with not wearing her NTG patch   Orders Placed This Encounter  Procedures  . Implantable device check   Patient Instructions  Please take your medications everyday as prescribed by your physician. Please remember to wear your NTG patch.  Your physician recommends that you schedule a follow-up appointment in: 3 months for a pacemaker check and in 6 months with Dr.Wilburn Keir + pacemaker check  Holli Humbles, MD, [place] [phone] office [phone] pager

## 2013-02-14 ENCOUNTER — Encounter (HOSPITAL_COMMUNITY): Payer: Self-pay | Admitting: Emergency Medicine

## 2013-02-14 ENCOUNTER — Emergency Department (HOSPITAL_COMMUNITY): Payer: Medicare Other

## 2013-02-14 ENCOUNTER — Emergency Department (HOSPITAL_COMMUNITY)
Admission: EM | Admit: 2013-02-14 | Discharge: 2013-02-14 | Disposition: A | Discharge status: Home / Self Care | Payer: Medicare Other | Attending: Emergency Medicine | Admitting: Emergency Medicine

## 2013-02-14 DIAGNOSIS — K219 Gastro-esophageal reflux disease without esophagitis: Secondary | ICD-10-CM | POA: Insufficient documentation

## 2013-02-14 DIAGNOSIS — I252 Old myocardial infarction: Secondary | ICD-10-CM | POA: Insufficient documentation

## 2013-02-14 DIAGNOSIS — Z7901 Long term (current) use of anticoagulants: Secondary | ICD-10-CM | POA: Insufficient documentation

## 2013-02-14 DIAGNOSIS — Z86718 Personal history of other venous thrombosis and embolism: Secondary | ICD-10-CM | POA: Insufficient documentation

## 2013-02-14 DIAGNOSIS — D649 Anemia, unspecified: Secondary | ICD-10-CM | POA: Insufficient documentation

## 2013-02-14 DIAGNOSIS — I2510 Atherosclerotic heart disease of native coronary artery without angina pectoris: Secondary | ICD-10-CM | POA: Insufficient documentation

## 2013-02-14 DIAGNOSIS — I129 Hypertensive chronic kidney disease with stage 1 through stage 4 chronic kidney disease, or unspecified chronic kidney disease: Secondary | ICD-10-CM | POA: Insufficient documentation

## 2013-02-14 DIAGNOSIS — E039 Hypothyroidism, unspecified: Secondary | ICD-10-CM | POA: Insufficient documentation

## 2013-02-14 DIAGNOSIS — I208 Other forms of angina pectoris: Secondary | ICD-10-CM

## 2013-02-14 DIAGNOSIS — E119 Type 2 diabetes mellitus without complications: Secondary | ICD-10-CM | POA: Insufficient documentation

## 2013-02-14 DIAGNOSIS — Z880 Allergy status to penicillin: Secondary | ICD-10-CM | POA: Insufficient documentation

## 2013-02-14 DIAGNOSIS — I4891 Unspecified atrial fibrillation: Secondary | ICD-10-CM | POA: Insufficient documentation

## 2013-02-14 DIAGNOSIS — N189 Chronic kidney disease, unspecified: Secondary | ICD-10-CM | POA: Insufficient documentation

## 2013-02-14 DIAGNOSIS — Z79899 Other long term (current) drug therapy: Secondary | ICD-10-CM | POA: Insufficient documentation

## 2013-02-14 DIAGNOSIS — Z950 Presence of cardiac pacemaker: Secondary | ICD-10-CM | POA: Insufficient documentation

## 2013-02-14 DIAGNOSIS — Z7982 Long term (current) use of aspirin: Secondary | ICD-10-CM | POA: Insufficient documentation

## 2013-02-14 DIAGNOSIS — M129 Arthropathy, unspecified: Secondary | ICD-10-CM | POA: Insufficient documentation

## 2013-02-14 DIAGNOSIS — I209 Angina pectoris, unspecified: Secondary | ICD-10-CM | POA: Insufficient documentation

## 2013-02-14 DIAGNOSIS — Z8711 Personal history of peptic ulcer disease: Secondary | ICD-10-CM | POA: Insufficient documentation

## 2013-02-14 DIAGNOSIS — Z87440 Personal history of urinary (tract) infections: Secondary | ICD-10-CM | POA: Insufficient documentation

## 2013-02-14 DIAGNOSIS — I509 Heart failure, unspecified: Secondary | ICD-10-CM | POA: Insufficient documentation

## 2013-02-14 DIAGNOSIS — H548 Legal blindness, as defined in USA: Secondary | ICD-10-CM | POA: Insufficient documentation

## 2013-02-14 DIAGNOSIS — E7800 Pure hypercholesterolemia, unspecified: Secondary | ICD-10-CM | POA: Insufficient documentation

## 2013-02-14 LAB — CBC WITH DIFFERENTIAL/PLATELET
Basophils Absolute: 0 10*3/uL (ref 0.0–0.1)
Basophils Relative: 0 % (ref 0–1)
EOS ABS: 0.1 10*3/uL (ref 0.0–0.7)
Eosinophils Relative: 1 % (ref 0–5)
HCT: 36.1 % (ref 36.0–46.0)
Hemoglobin: 11.3 g/dL — ABNORMAL LOW (ref 12.0–15.0)
LYMPHS PCT: 36 % (ref 12–46)
Lymphs Abs: 1.7 10*3/uL (ref 0.7–4.0)
MCH: 28.7 pg (ref 26.0–34.0)
MCHC: 31.3 g/dL (ref 30.0–36.0)
MCV: 91.6 fL (ref 78.0–100.0)
MONOS PCT: 8 % (ref 3–12)
Monocytes Absolute: 0.4 10*3/uL (ref 0.1–1.0)
NEUTROS ABS: 2.5 10*3/uL (ref 1.7–7.7)
NEUTROS PCT: 54 % (ref 43–77)
PLATELETS: 147 10*3/uL — AB (ref 150–400)
RBC: 3.94 MIL/uL (ref 3.87–5.11)
RDW: 13.8 % (ref 11.5–15.5)
WBC: 4.7 10*3/uL (ref 4.0–10.5)

## 2013-02-14 LAB — POCT I-STAT TROPONIN I
TROPONIN I, POC: 0.01 ng/mL (ref 0.00–0.08)
TROPONIN I, POC: 0 ng/mL (ref 0.00–0.08)

## 2013-02-14 LAB — BASIC METABOLIC PANEL
BUN: 17 mg/dL (ref 6–23)
CHLORIDE: 103 meq/L (ref 96–112)
CO2: 27 meq/L (ref 19–32)
Calcium: 9.1 mg/dL (ref 8.4–10.5)
Creatinine, Ser: 1.36 mg/dL — ABNORMAL HIGH (ref 0.50–1.10)
GFR calc Af Amer: 38 mL/min — ABNORMAL LOW (ref >90)
GFR, EST NON AFRICAN AMERICAN: 33 mL/min — AB (ref >90)
GLUCOSE: 118 mg/dL — AB (ref 70–99)
Potassium: 4.7 mEq/L (ref 3.7–5.3)
SODIUM: 140 meq/L (ref 137–147)

## 2013-02-14 LAB — PROTIME-INR
INR: 2.44 — ABNORMAL HIGH (ref 0.00–1.49)
Prothrombin Time: 25.7 seconds — ABNORMAL HIGH (ref 11.6–15.2)

## 2013-02-14 LAB — PRO B NATRIURETIC PEPTIDE: Pro B Natriuretic peptide (BNP): 991.8 pg/mL — ABNORMAL HIGH (ref 0–450)

## 2013-02-14 MED ORDER — NITROGLYCERIN 0.4 MG SL SUBL
0.4000 mg | SUBLINGUAL_TABLET | SUBLINGUAL | Status: DC | PRN
  Administered 2013-02-14: 0.4 mg via SUBLINGUAL
  Filled 2013-02-14: qty 25

## 2013-02-14 MED ORDER — ASPIRIN 81 MG PO CHEW
324.0000 mg | CHEWABLE_TABLET | Freq: Once | ORAL | Status: AC
  Administered 2013-02-14: 324 mg via ORAL
  Filled 2013-02-14: qty 4

## 2013-02-14 NOTE — ED Notes (Signed)
Pt c/o central chest pressure with Left sided chest pain radiating to L arm since 12pm today. Pt c/o dizziness and numbnesss/tingling in L leg. A&Ox4. NAD noted. Pt sts that pressure and pain has subsided some at this point, but that it's still there.

## 2013-02-14 NOTE — ED Notes (Signed)
Pt also c/o heat in Upper left thigh x 1 week.

## 2013-02-14 NOTE — ED Notes (Signed)
Lab confirmed they would add BNP to bloodwork sent down earlier.

## 2013-02-14 NOTE — ED Provider Notes (Signed)
TIME SEEN: 6:31 PM  CHIEF COMPLAINT: Chest pain, shortness of breath, lightheadedness  HPI: Patient is a 80 year old female with history of hypertension, hyperlipidemia, prior MI status post stents, CHF, atrial fibrillation, second-degree AV block status post pacemaker placement, prior upper extremity DVT on Coumadin who presents emergency department with left-sided chest tightness without radiation that started today at rest. It has improved without intervention.  She describes it as mild. No aggravating or relieving factors. It is associated with shortness of breath and lightheadedness. No diaphoresis or nausea, vomiting. She is a very poor historian and is unable to tell if this feels like her prior anginal pain. No fever or cough. No lower extremity swelling or pain. She denies missing any doses of Coumadin. She denies a prior history of PE.  PCP is Dr. Kevan Ny Cardiologist at [facility]  ROS: See HPI Constitutional: no fever  Eyes: no drainage  ENT: no runny nose   Cardiovascular:  chest pain  Resp: SOB  GI: no vomiting GU: no dysuria Integumentary: no rash  Allergy: no hives  Musculoskeletal: no leg swelling  Neurological: no slurred speech ROS otherwise negative  PAST MEDICAL HISTORY/PAST SURGICAL HISTORY:  Past Medical History  Diagnosis Date  . Hypertension   . Coronary artery disease   . Legally blind   . Chronic kidney disease   . Anemia   . Hypothyroid   . Gastric ulcer   . Hiatal hernia   . Dyspepsia   . UTI (lower urinary tract infection)   . H/O: GI bleed 12//13  . Diastolic dysfunction   . Pacemaker   . High cholesterol   . CHF (congestive heart failure)   . Anginal pain   . MI (myocardial infarction) ? 1994  . DVT of upper extremity (deep vein thrombosis) 06/13/2012    BUE  . Seasonal allergies   . Allergy to perfume     "strong perfumes only" (06/13/2012)  . Shortness of breath     "can happen at any time" (06/13/2012)  . Type II  diabetes mellitus   . History of blood transfusion 2013    "not related to OR; BP had dropped" (06/13/2012)  . GERD (gastroesophageal reflux disease)   . Daily headache   . Arthritis     "all over" (06/13/2012)  . A-fib   . 2Nd degree atrioventricular block     MEDICATIONS:  Prior to Admission medications   Medication Sig Start Date End Date Taking? Authorizing Provider  ACCU-CHEK FASTCLIX LANCETS MISC 2 (two) times daily. 07/03/12  Yes Historical Provider, MD  acetaminophen (TYLENOL) 500 MG tablet Take 500-1,000 mg by mouth 2 (two) times daily as needed for mild pain.   Yes Historical Provider, MD  acetaminophen-codeine (TYLENOL #3) 300-30 MG per tablet Take 1 tablet by mouth 2 (two) times daily as needed for pain.   Yes Historical Provider, MD  aspirin 81 MG chewable tablet Chew 81 mg by mouth daily.   Yes Historical Provider, MD  Blood Glucose Monitoring Suppl (ACCU-CHEK AVIVA PLUS) W/DEVICE KIT 1 each by Does not apply route as needed. 11/18/12  Yes Elayne Snare, MD  feeding supplement, GLUCERNA SHAKE, (GLUCERNA SHAKE) LIQD Take 237 mLs by mouth 2 (two) times daily between meals.   Yes Historical Provider, MD  folic acid (FOLVITE) 1 MG tablet take 1 tablet by mouth once daily 01/29/13  Yes Mihai Croitoru, MD  gabapentin (NEURONTIN) 100 MG capsule Take 100 mg by mouth 2 (two) times daily.  Yes Historical Provider, MD  glipiZIDE (GLUCOTROL) 2.5 mg TABS tablet Take 0.5 tablets (2.5 mg total) by mouth daily before supper. 01/24/13  Yes Elayne Snare, MD  glucose blood (ACCU-CHEK AVIVA PLUS) test strip Use to check blood sugars 2 times daily dx code 250.00 11/15/12  Yes Elayne Snare, MD  isosorbide mononitrate (IMDUR) 30 MG 24 hr tablet Take 15 mg by mouth daily.   Yes Historical Provider, MD  levothyroxine (SYNTHROID, LEVOTHROID) 50 MCG tablet Take 50 mcg by mouth daily before breakfast.   Yes Historical Provider, MD  loratadine (CLARITIN) 10 MG tablet Take 10 mg by mouth daily.   Yes Historical  Provider, MD  metoprolol tartrate (LOPRESSOR) 25 MG tablet Take 12.5 mg by mouth 2 (two) times daily.   Yes Historical Provider, MD  neomycin-bacitracin-polymyxin (NEOSPORIN) [phone] ointment Place 1 application into the left ear as needed (for inflammation behind the ear).   Yes Historical Provider, MD  nitroGLYCERIN (NITRODUR - DOSED IN MG/24 HR) 0.2 mg/hr patch Place 1 patch onto the skin daily as needed. Pt does not wear all the time   Yes Historical Provider, MD  pantoprazole (PROTONIX) 40 MG tablet Take 40 mg by mouth 2 (two) times daily.   Yes Historical Provider, MD  phenazopyridine (PYRIDIUM) 100 MG tablet Take 100 mg by mouth 3 (three) times daily with meals. As needed   Yes Historical Provider, MD  rosuvastatin (CRESTOR) 10 MG tablet Take 10 mg by mouth at bedtime.    Yes Historical Provider, MD  Thymol CRYS Apply 1 application topically 2 (two) times daily. Used for nails   Yes Historical Provider, MD  tobramycin (TOBREX) 0.3 % ophthalmic solution Place 1 drop into both eyes daily as needed.  10/18/12  Yes Historical Provider, MD  Vitamin D, Ergocalciferol, (DRISDOL) 50000 UNITS CAPS capsule Take 50,000 Units by mouth every Monday.   Yes Historical Provider, MD  warfarin (COUMADIN) 5 MG tablet Take 2.5-5 mg by mouth daily. Takes 36m on Monday and Friday. Takes 2.561mTuesday, Wednesday, Thursdays, Saturday, and Sunday.   Yes Historical Provider, MD  zolpidem (AMBIEN) 10 MG tablet Take 5 mg by mouth at bedtime as needed. sleep   Yes Historical Provider, MD    ALLERGIES:  Allergies  Allergen Reactions  . Darvon     Unknown reaction   . Digoxin And Related Nausea Only  . Penicillins Other (See Comments)    Was told had allergy from childhood... Unknown reaction  . Percocet [Oxycodone-Acetaminophen] Other (See Comments)    confused  . Percodan [Oxycodone-Aspirin]   . Vicodin [Hydrocodone-Acetaminophen] Other (See Comments)    confused    SOCIAL HISTORY:  History  Substance Use  Topics  . Smoking status: Never Smoker   . Smokeless tobacco: Never Used  . Alcohol Use: No    FAMILY HISTORY: Family History  Problem Relation Age of Onset  . Breast cancer Daughter   . Diabetes Son   . Diabetes Daughter   . Heart disease Father   . Diabetes Father   . Colon polyps Daughter   . Heart attack Brother   . Diabetes Brother   . Stroke Sister     EXAM: There were no vitals taken for this visit. CONSTITUTIONAL: Alert and oriented and responds appropriately to questions. Well-appearing; well-nourished HEAD: Normocephalic EYES: Conjunctivae clear, PERRL ENT: normal nose; no rhinorrhea; moist mucous membranes; pharynx without lesions noted NECK: Supple, no meningismus, no LAD  CARD: RRR; S1 and S2 appreciated; systolic murmur, no clicks, no  rubs, no gallops RESP: Normal chest excursion without splinting or tachypnea; breath sounds clear and equal bilaterally; no wheezes, no rhonchi, no rales,  ABD/GI: Normal bowel sounds; non-distended; soft, non-tender, no rebound, no guarding BACK:  The back appears normal and is non-tender to palpation, there is no CVA tenderness EXT: Normal ROM in all joints; non-tender to palpation; no edema; normal capillary refill; no cyanosis    SKIN: Normal color for age and race; warm NEURO: Moves all extremities equally PSYCH: The patient's mood and manner are appropriate. Grooming and personal hygiene are appropriate.  MEDICAL DECISION MAKING: Patient here with chest pain. She has significant cardiac history. Will obtain cardiac labs, chest x-ray. EKG shows paced rhythm with no ischemic changes. Will give aspirin and nitroglycerin. Have discussed with patient and family that I feel she'll need admission. She is unsure when her last stress test or cardiac catheterization was.  ED PROGRESS: Per cardiology notes, patient had CABG in 1994 and her last cardiac catheterization was in 2008 that showed patent grafts.  Patient wears nitroglycerin  patch regularly. Daughter reports that she changed her patch this morning. She has a patch located on her left upper extremity currently. She still having chest pain. She initially refused nitroglycerin and she is scared but will drop her blood pressure as it is done at once in the past. I discussed with patient and her blood pressure is currently normal and I feel she needs medication to give her chest pain completely resolved. Per cardiology notes, patient frequently has chest pain. I think her symptoms today are similar to her stable angina. Patient reports her testing today is no different than her prior episodes but states when she called her primary care doctor's office, they instructed her to come to the emergency department. She states her chest that started today at 3 PM. Initial troponin is negative. INR is therapeutic. Chest x-ray shows no edema or infiltrate. Will work troponin at 9 PM, 6 hours after the onset of symptoms and attempt to get patient chest pain-free. Will discuss with patient's cardiologist as she may be able to followup as an outpatient given I believe this is her stable angina.   8:26 PM  Pt's labs are reassuring. Her BNP is 991 but is improved compared to prior. Chest x-ray shows no edema or infiltrate. Patient is chest pain-free after one nitroglycerin tablet.   10:00 PM  Spoke with Dr. Jules Husbands with cardiology. Given patient has a very well documented history of stable angina and she reports is similar to her prior symptoms of chest pain she has had 2 negative sets of cardiac enzymes and is currently chest pain-free and hemodynamically stable, myself, her family and her cardiologist feels she is safe to be discharged home with close outpatient followup. Have given strict return precautions. Patient and daughter at bedside verbalize understanding and are comfortable with plan.  EKG Interpretation    Date/Time:  Friday February 14 2013 18:29:53 EST Ventricular Rate:  74 PR  Interval:  218 QRS Duration: 184 QT Interval:  465 QTC Calculation: 516 R Axis:   -74 Text Interpretation:  Atrial-sensed ventricular-paced rhythm No further analysis attempted due to paced rhythm Baseline wander in lead(s) V2 No significant change since last tracing Confirmed by Laurens Matheny  DO, Rochanda Harpham ([id number]) on 02/14/2013 6:32:23 PM             [place], DO 02/14/13 2200

## 2013-02-14 NOTE — Discharge Instructions (Signed)
Angina Pectoris (Stable Angina) Angina pectoris, often just called angina, is extreme discomfort in your chest, neck, or arm caused by a lack of blood in the middle and thickest layer of your heart wall (myocardium). It may feel like tightness or heavy pressure. It may feel like a crushing or squeezing pain. Some people say it feels like gas or indigestion. It may go down your shoulders, back, and arms. Some people may have symptoms other than pain. These symptoms include fatigue, shortness of breath, cold sweats, or nausea. There are four different types of angina:  Stable angina Stable angina usually occurs in episodes of predictable frequency and duration. It usually is brought on by physical activity, emotional stress, or excitement. These are all times when the myocardium needs more oxygen. Stable angina usually lasts a few minutes and often is relieved by taking a medicine that can be taken under your tongue (sublingually). The medicine is called nitroglycerin. Stable angina is caused by a buildup of plaque inside the arteries, which restricts blood flow to the heart muscle (atherosclerosis).  Unstable angina Unstable angina can occur even when your body experiences little or no physical exertion. It can occur during sleep. It can also occur at rest. It can suddenly increase in severity or frequency. It might not be relieved by sublingual nitroglycerin. It can last up to 30 minutes. The most common cause of unstable angina is a blood clot that has developed on the top of plaque buildup inside a coronary artery. It can lead to a heart attack if the blood clot completely blocks the artery.  Microvascular angina This type of angina is caused by a disorder of tiny blood vessels called arterioles. Microvascular angina is more common in women. The pain may be more severe and last longer than other types of angina pectoris.  Prinzmetal or variant angina This type of angina pectoris usually occurs when your  body experiences little or no physical exertion. It especially occurs in the early morning hours. It is caused by a spasm of your coronary artery. HOME CARE INSTRUCTIONS   Only take over-the-counter and prescription medicines as directed by your caregiver.  Stay active or increase your exercise as directed by your caregiver.  Limit strenuous activity as directed by your caregiver.  Limit heavy lifting as directed by your caregiver.  Maintain a healthy weight.  Learn about and eat heart-healthy foods.  Do not smoke. SEEK IMMEDIATE MEDICAL CARE IF:  You experience the following symptoms:  Chest, neck, deep shoulder, or arm pain or discomfort that lasts more than a few minutes.  Chest, neck, deep shoulder, or arm pain or discomfort that goes away and comes back, repeatedly.  Heavy sweating with discomfort, without a noticeable cause.  Shortness of breath or difficulty breathing.  Angina that does not get better after a few minutes of rest or after taking sublingual nitroglycerin. These can all be symptoms of a heart attack, which is a medical emergency! Get medical help at once. Call your local emergency service (911 in [place] immediately. Do not  drive yourself to the hospital and do not  wait to for your symptoms to go away. MAKE SURE YOU:  Understand these instructions.  Will watch your condition.  Will get help right away if you are not doing well or get worse. Document Released: 01/02/2005 Document Revised: 12/20/2011 Document Reviewed: 10/12/2011 [facility] Patient Information 2014 [place], [place].

## 2013-02-20 ENCOUNTER — Ambulatory Visit (INDEPENDENT_AMBULATORY_CARE_PROVIDER_SITE_OTHER): Payer: Medicare Other | Admitting: Cardiology

## 2013-02-20 ENCOUNTER — Other Ambulatory Visit: Payer: Self-pay | Admitting: *Deleted

## 2013-02-20 ENCOUNTER — Telehealth: Payer: Self-pay | Admitting: *Deleted

## 2013-02-20 VITALS — BP 100/60 | Ht 64.0 in | Wt 197.0 lb

## 2013-02-20 DIAGNOSIS — R0609 Other forms of dyspnea: Secondary | ICD-10-CM

## 2013-02-20 DIAGNOSIS — R079 Chest pain, unspecified: Secondary | ICD-10-CM

## 2013-02-20 DIAGNOSIS — R0600 Dyspnea, unspecified: Secondary | ICD-10-CM

## 2013-02-20 DIAGNOSIS — R0989 Other specified symptoms and signs involving the circulatory and respiratory systems: Secondary | ICD-10-CM

## 2013-02-20 MED ORDER — FUROSEMIDE 40 MG PO TABS: 40.0000 mg | ORAL_TABLET | Freq: Every day | ORAL | Status: DC

## 2013-02-20 NOTE — Patient Instructions (Signed)
Take Lasix (fluid pill) 40 mg for 3 days. Do not take if blood pressure is less than 100 (top number).  Continue to check weight daily. Call our office if you gain more than 3 lb in more than 24 hrs Eat a low salt diet.  Return for stress test.  Call our office if you develop dizziness or feel light headed.

## 2013-02-20 NOTE — Telephone Encounter (Signed)
Is she taking 2.5 mg as advised? If so, can give a try without the glipizide: for 3 days. If sugars increase >150, can restart it and move it before b'fast (I see this was started before supper).

## 2013-02-20 NOTE — Telephone Encounter (Signed)
Pt's daughter called, she said Dr. Dwyane Dee took her mom off of insulin and put her on Glipizide, she said her sugars the last three days have been: Today 77 Yesterday 84, 99 Tuesday 94, 102 Monday 75, 107. She said her moms sugars have not been this low before and wants to know if her sugars are okay? Please advise

## 2013-02-21 ENCOUNTER — Telehealth: Payer: Self-pay | Admitting: *Deleted

## 2013-02-21 NOTE — Telephone Encounter (Signed)
Pt's daughter called and lvm asking for a return call concerning pt's blood sugar level. Returned call, unable to reach and unable to lvm.

## 2013-02-23 ENCOUNTER — Encounter: Payer: Self-pay | Admitting: Cardiology

## 2013-02-23 DIAGNOSIS — R0600 Dyspnea, unspecified: Secondary | ICD-10-CM | POA: Insufficient documentation

## 2013-02-23 NOTE — Progress Notes (Signed)
Patient ID: [name], female   DOB: Dec 03, 1920, 80 y.o.   MRN: [id number]  02/23/2013 [name]   06-Oct-1920  [id number]  Primary Physicia DEAN, [name], MD Primary Cardiologist: Croitoru   HPI:  [name] is a 80 y.o. female who is nonambulatory (uses a wheelchair) and has a history of coronary disease, previous bypass surgery, permanent pacemaker for high-grade AV block, paroxysmal atrial fibrillation, recent upper extremity deep venous thrombosis associated with a venous catheter and recurrent problems with severe edema due to gastrointestinal bleeding. She is on coumadin. She is followed by Dr. Sallyanne Kuster.   She presents to clinic today with a complaint of  increasing SOB over the past several weeks. She was seen by her PCP for this and he instructed her to follow-up with cardiology. The patient is mostly wheelchair bound, but on occasion uses a walker to ambulate around her home. She notes decreased exercise tolerance, noting that she gives out walking from her bed to the bathroom. This is new for her. She also notes some increased SOB at rest. She notes occasional chest discomfort, but not frequent and not severe. She also notes weight gain of 3-4 lb in the last 2 days. She notes increased LEE, but denies orthopnea, PND.     Current Outpatient Prescriptions  Medication Sig Dispense Refill  . ACCU-CHEK FASTCLIX LANCETS MISC 2 (two) times daily.      Marland Kitchen acetaminophen (TYLENOL) 500 MG tablet Take 500-1,000 mg by mouth 2 (two) times daily as needed for mild pain.      Marland Kitchen acetaminophen-codeine (TYLENOL #3) 300-30 MG per tablet Take 1 tablet by mouth 2 (two) times daily as needed for pain.      Marland Kitchen aspirin 81 MG chewable tablet Chew 81 mg by mouth daily.      . Blood Glucose Monitoring Suppl (ACCU-CHEK AVIVA PLUS) W/DEVICE KIT 1 each by Does not apply route as needed.  1 kit  0  . feeding supplement, GLUCERNA SHAKE, (GLUCERNA SHAKE) LIQD Take 237 mLs by mouth 2 (two) times  daily between meals.      . folic acid (FOLVITE) 1 MG tablet take 1 tablet by mouth once daily  30 tablet  11  . gabapentin (NEURONTIN) 100 MG capsule Take 100 mg by mouth 2 (two) times daily.       Marland Kitchen glipiZIDE (GLUCOTROL) 2.5 mg TABS tablet Take 0.5 tablets (2.5 mg total) by mouth daily before supper.  30 tablet  3  . glucose blood (ACCU-CHEK AVIVA PLUS) test strip Use to check blood sugars 2 times daily dx code 250.00  200 each  3  . isosorbide mononitrate (IMDUR) 30 MG 24 hr tablet Take 15 mg by mouth daily.      Marland Kitchen levothyroxine (SYNTHROID, LEVOTHROID) 50 MCG tablet Take 50 mcg by mouth daily before breakfast.      . loratadine (CLARITIN) 10 MG tablet Take 10 mg by mouth daily.      . metoprolol tartrate (LOPRESSOR) 25 MG tablet Take 12.5 mg by mouth 2 (two) times daily.      Marland Kitchen neomycin-bacitracin-polymyxin (NEOSPORIN) [phone] ointment Place 1 application into the left ear as needed (for inflammation behind the ear).      . nitroGLYCERIN (NITRODUR - DOSED IN MG/24 HR) 0.2 mg/hr patch Place 1 patch onto the skin daily as needed. Pt does not wear all the time      . pantoprazole (PROTONIX) 40 MG tablet Take 40 mg by mouth 2 (two) times  daily.      . phenazopyridine (PYRIDIUM) 100 MG tablet Take 100 mg by mouth 3 (three) times daily with meals. As needed      . rosuvastatin (CRESTOR) 10 MG tablet Take 10 mg by mouth at bedtime.       . Thymol CRYS Apply 1 application topically 2 (two) times daily. Used for nails      . tobramycin (TOBREX) 0.3 % ophthalmic solution Place 1 drop into both eyes daily as needed.       . Vitamin D, Ergocalciferol, (DRISDOL) 50000 UNITS CAPS capsule Take 50,000 Units by mouth every Monday.      . warfarin (COUMADIN) 5 MG tablet Take 2.5-5 mg by mouth daily. Takes $RemoveBefo'5mg'uABgVkkiZDd$  on Monday and Friday. Takes 2.$RemoveBeforeD'5mg'mpFdGDRBPmKwTI$  Tuesday, Wednesday, Thursdays, Saturday, and Sunday.      . zolpidem (AMBIEN) 10 MG tablet Take 5 mg by mouth at bedtime as needed. sleep      . furosemide (LASIX) 40 MG  tablet Take 1 tablet (40 mg total) by mouth daily.  3 tablet  0   No current facility-administered medications for this visit.    Allergies  Allergen Reactions  . Darvon     Unknown reaction   . Digoxin And Related Nausea Only  . Penicillins Other (See Comments)    Was told had allergy from childhood... Unknown reaction  . Percocet [Oxycodone-Acetaminophen] Other (See Comments)    confused  . Percodan [Oxycodone-Aspirin]   . Vicodin [Hydrocodone-Acetaminophen] Other (See Comments)    confused    History   Social History  . Marital Status: Married    Spouse Name: N/A    Number of Children: 47  . Years of Education: N/A   Occupational History  .     Social History Main Topics  . Smoking status: Never Smoker   . Smokeless tobacco: Never Used  . Alcohol Use: No  . Drug Use: No  . Sexual Activity: No   Other Topics Concern  . Not on file   Social History Narrative  . No narrative on file     Review of Systems: General: negative for chills, fever, night sweats or weight changes.  Cardiovascular: negative for chest pain, dyspnea on exertion, edema, orthopnea, palpitations, paroxysmal nocturnal dyspnea or shortness of breath Dermatological: negative for rash Respiratory: negative for cough or wheezing Urologic: negative for hematuria Abdominal: negative for nausea, vomiting, diarrhea, bright red blood per rectum, melena, or hematemesis Neurologic: negative for visual changes, syncope, or dizziness All other systems reviewed and are otherwise negative except as noted above.    Blood pressure 100/60, height $RemoveBefore'5\' 4"'RYtetJuRaqYAO$  (1.626 m), weight 197 lb (89.359 kg).  General appearance: alert, cooperative and no distress Neck: no carotid bruit and no JVD Lungs: clear to auscultation bilaterally Heart: regular rate and rhythm, S1, S2 normal, no murmur, click, rub or gallop Extremities: 1+ bilateral LEE Pulses: 2+ and symmetric Skin: warm and dry Neurologic: Grossly  normal    ASSESSMENT AND PLAN:   Dyspnea Dyspnea with associated decreased exercise tolerance, subjective weight gain and LEE. She does appear to have 1+ LEE bilaterally, however no JVD and clear lung fields bilaterally. Will order for patient to take Lasix 40 mg x 3 days with supplemental potassium. Will also evaluate with a 2D echo to assess systolic function.     PLAN  PO Lasix x 3 days for LEE. Plan for 2D echo to assess systolic function. Will have patient f/u after 2D echo.   SIMMONS, BRITTAINYPA-C  02/23/2013 10:30 AM

## 2013-02-23 NOTE — Assessment & Plan Note (Signed)
Dyspnea with associated decreased exercise tolerance, subjective weight gain and LEE. She does appear to have 1+ LEE bilaterally, however no JVD and clear lung fields bilaterally. Will order for patient to take Lasix 40 mg x 3 days with supplemental potassium. Will also evaluate with a 2D echo to assess systolic function.

## 2013-02-24 ENCOUNTER — Telehealth: Payer: Self-pay | Admitting: Cardiovascular Disease

## 2013-02-24 MED ORDER — POTASSIUM CHLORIDE ER 10 MEQ PO TBCR: 10.0000 meq | EXTENDED_RELEASE_TABLET | Freq: Every day | ORAL | Status: DC

## 2013-02-24 NOTE — Telephone Encounter (Signed)
Returned call and pt verified x 2 w/ [name], pt's daughter.  Asked if pt should still take fluid pill.  Stated pt's weight down to 191 lbs and pt was 195 lbs at OV last week.  Pt has not been given furosemide.  Asked daughter to give home weights.  2/5 195 lbs 2/6 194 lbs 2/7 192 lbs 2/8 195 lbs 2/9 195 lbs  Daughter advised pt take furosemide and K+ as advised x 3 days.  Daughter stated pt was not given K+.  Informed RN will get order for it and send in script as PA did mention sending in script.  Verbalized understanding and will pick up today.  Also verbalized understanding that pt is NOT to take furosemide and K+ if SBP <100.  Dr. Sallyanne Kuster notified and advised K+ 10 mEq daily be sent in w/ short-term furosemide.  Rx sent to pharmacy.

## 2013-02-24 NOTE — Telephone Encounter (Signed)
Please update with recent inflammation, thanks

## 2013-02-24 NOTE — Telephone Encounter (Signed)
I called patient to schedule Echo that was ordered by Ellen Henri, PA.  States that she was give a fluid pill by Britany---she has weighed and her weight is down from 195 to 191.  Does she need to continue the fluid pill?

## 2013-02-25 ENCOUNTER — Telehealth: Payer: Self-pay | Admitting: *Deleted

## 2013-02-25 NOTE — Telephone Encounter (Signed)
Pt's daughter called, she said Dr. Kumar took her mom off of insulin and put her on Glipizide, she said her sugars the last three days have been: Today 77 Yesterday 84, 99 Tuesday 94, 102 Monday 75, 107. She said her moms sugars have not been this low before and wants to know if her sugars are okay? Please advise 

## 2013-02-26 ENCOUNTER — Ambulatory Visit: Payer: Medicare Other | Admitting: Pharmacist Clinician (PhC)/ Clinical Pharmacy Specialist

## 2013-02-26 ENCOUNTER — Ambulatory Visit (INDEPENDENT_AMBULATORY_CARE_PROVIDER_SITE_OTHER): Payer: Medicare Other | Admitting: Pharmacist Clinician (PhC)/ Clinical Pharmacy Specialist

## 2013-02-26 VITALS — BP 104/68 | HR 76

## 2013-02-26 DIAGNOSIS — I82409 Acute embolism and thrombosis of unspecified deep veins of unspecified lower extremity: Secondary | ICD-10-CM

## 2013-02-26 DIAGNOSIS — Z7901 Long term (current) use of anticoagulants: Secondary | ICD-10-CM

## 2013-02-26 LAB — POCT INR: INR: 3.8

## 2013-02-28 ENCOUNTER — Encounter (HOSPITAL_COMMUNITY): Payer: Medicare Other

## 2013-03-06 ENCOUNTER — Ambulatory Visit (INDEPENDENT_AMBULATORY_CARE_PROVIDER_SITE_OTHER): Payer: Medicare Other | Admitting: Pharmacist Clinician (PhC)/ Clinical Pharmacy Specialist

## 2013-03-06 ENCOUNTER — Ambulatory Visit (HOSPITAL_COMMUNITY)
Admission: RE | Admit: 2013-03-06 | Discharge: 2013-03-06 | Disposition: A | Discharge status: Home / Self Care | Payer: Medicare Other | Source: Ambulatory Visit | Attending: Cardiovascular Disease | Admitting: Cardiovascular Disease

## 2013-03-06 DIAGNOSIS — R0602 Shortness of breath: Secondary | ICD-10-CM

## 2013-03-06 DIAGNOSIS — R0609 Other forms of dyspnea: Secondary | ICD-10-CM | POA: Insufficient documentation

## 2013-03-06 DIAGNOSIS — R0600 Dyspnea, unspecified: Secondary | ICD-10-CM

## 2013-03-06 DIAGNOSIS — R079 Chest pain, unspecified: Secondary | ICD-10-CM | POA: Insufficient documentation

## 2013-03-06 DIAGNOSIS — I82409 Acute embolism and thrombosis of unspecified deep veins of unspecified lower extremity: Secondary | ICD-10-CM

## 2013-03-06 DIAGNOSIS — I059 Rheumatic mitral valve disease, unspecified: Secondary | ICD-10-CM | POA: Insufficient documentation

## 2013-03-06 DIAGNOSIS — I079 Rheumatic tricuspid valve disease, unspecified: Secondary | ICD-10-CM | POA: Insufficient documentation

## 2013-03-06 DIAGNOSIS — E119 Type 2 diabetes mellitus without complications: Secondary | ICD-10-CM | POA: Insufficient documentation

## 2013-03-06 DIAGNOSIS — R0989 Other specified symptoms and signs involving the circulatory and respiratory systems: Secondary | ICD-10-CM | POA: Insufficient documentation

## 2013-03-06 DIAGNOSIS — Z7901 Long term (current) use of anticoagulants: Secondary | ICD-10-CM

## 2013-03-06 DIAGNOSIS — I2510 Atherosclerotic heart disease of native coronary artery without angina pectoris: Secondary | ICD-10-CM | POA: Insufficient documentation

## 2013-03-06 LAB — POCT INR: INR: 3

## 2013-03-06 MED ORDER — WARFARIN SODIUM 2.5 MG PO TABS: ORAL_TABLET | ORAL | Status: DC

## 2013-03-06 NOTE — Progress Notes (Signed)
2D Echo Performed 03/06/2013    Afua Hoots, RCS  

## 2013-03-17 ENCOUNTER — Other Ambulatory Visit: Payer: Self-pay | Admitting: *Deleted

## 2013-03-17 MED ORDER — ACCU-CHEK FASTCLIX LANCET KIT: PACK | Status: DC

## 2013-03-24 ENCOUNTER — Other Ambulatory Visit (INDEPENDENT_AMBULATORY_CARE_PROVIDER_SITE_OTHER): Payer: Medicare Other

## 2013-03-24 ENCOUNTER — Ambulatory Visit (INDEPENDENT_AMBULATORY_CARE_PROVIDER_SITE_OTHER): Payer: Medicare Other | Admitting: Endocrinology

## 2013-03-24 ENCOUNTER — Encounter: Payer: Self-pay | Admitting: Endocrinology

## 2013-03-24 VITALS — BP 118/70 | HR 74 | Temp 98.0°F | Resp 16

## 2013-03-24 DIAGNOSIS — E039 Hypothyroidism, unspecified: Secondary | ICD-10-CM

## 2013-03-24 DIAGNOSIS — IMO0001 Reserved for inherently not codable concepts without codable children: Secondary | ICD-10-CM

## 2013-03-24 DIAGNOSIS — E1165 Type 2 diabetes mellitus with hyperglycemia: Principal | ICD-10-CM

## 2013-03-24 DIAGNOSIS — E119 Type 2 diabetes mellitus without complications: Secondary | ICD-10-CM

## 2013-03-24 LAB — GLUCOSE, RANDOM: Glucose, Bld: 99 mg/dL (ref 70–99)

## 2013-03-24 NOTE — Progress Notes (Signed)
Patient ID: [name], female   DOB: Sep 21, 1920, 80 y.o.   MRN: [id number]   Reason for Appointment: Diabetes follow-up   History of Present Illness   Diagnosis: Type 2 diabetes mellitus, date of diagnosis: 1972.   She had been on insulin for several years and this had been tapered off in 2013 because of weight loss and lower blood sugars..  Because of her age and multiple medical problems she has been told not to take any insulin because of only modestly high readings  Recent history: Her blood sugars are still looking fairly good overall and because of higher readings after meals she was given low-dose glipizide, 2.5 mg.  A1c was recently higher at 7.8 previously She is generally taking glipizide before eating but occasionally after Blood sugars are not checked after supper but otherwise look excellent No hypoglycemia  Her weight is difficult to assess as she did not get on the scale today  Monitors blood glucose: 2.0 times daily.  Glucometer: Accucheck Aviva.  Blood Glucose readings:  PREMEAL Breakfast Lunch Dinner Bedtime Overall  Glucose range:  87-119   90-142   104-168  ?   87-168   Mean/median:      111    Hypoglycemia frequency: None .  Meals: 2 meals at noon and 7 pm; sometimes has lemonade or sprite. Has Glucerna at dinner at times  Physical activity: exercise:none.  Dietician visit: Most recent:2001.   Complications: peripheral neuropathy. Urine microalbumin normal previously  Wt Readings from Last 3 Encounters:  02/20/13 197 lb (89.359 kg)  01/28/13 192 lb 9.6 oz (87.363 kg)  01/24/13 268 lb (121.564 kg)   Lab Results  Component Value Date   HGBA1C 7.8* 01/24/2013   HGBA1C 8.0* 11/18/2012   HGBA1C 6.9* 06/13/2012   Lab Results  Component Value Date   [place] 95 01/24/2013   CREATININE 1.36* 02/14/2013       Medication List       This list is accurate as of: 03/24/13 11:59 PM.  Always use your most recent med list.               ACCU-CHEK  AVIVA PLUS W/DEVICE Kit  1 each by Does not apply route as needed.     ACCU-CHEK FASTCLIX LANCET Kit  Use as directec     ACCU-CHEK FASTCLIX LANCETS Misc  2 (two) times daily.     acetaminophen 500 MG tablet  Commonly known as:  TYLENOL  Take 500-1,000 mg by mouth 2 (two) times daily as needed for mild pain.     acetaminophen-codeine 300-30 MG per tablet  Commonly known as:  TYLENOL #3  Take 1 tablet by mouth 2 (two) times daily as needed for pain.     aspirin 81 MG chewable tablet  Chew 81 mg by mouth daily.     feeding supplement (GLUCERNA SHAKE) Liqd  Take 237 mLs by mouth 2 (two) times daily between meals.     folic acid 1 MG tablet  Commonly known as:  FOLVITE  take 1 tablet by mouth once daily     furosemide 40 MG tablet  Commonly known as:  LASIX  Take 1 tablet (40 mg total) by mouth daily.     gabapentin 100 MG capsule  Commonly known as:  NEURONTIN  Take 100 mg by mouth 2 (two) times daily.     glipiZIDE 2.5 mg Tabs tablet  Commonly known as:  GLUCOTROL  Take 0.5 tablets (2.5 mg total) by mouth  daily before supper.     glucose blood test strip  Commonly known as:  ACCU-CHEK AVIVA PLUS  Use to check blood sugars 2 times daily dx code 250.00     HYDROcodone-acetaminophen 5-325 MG per tablet  Commonly known as:  NORCO/VICODIN  Take 1 tablet by mouth 2 (two) times daily.     isosorbide mononitrate 30 MG 24 hr tablet  Commonly known as:  IMDUR  Take 15 mg by mouth daily.     levothyroxine 50 MCG tablet  Commonly known as:  SYNTHROID, LEVOTHROID  Take 50 mcg by mouth daily before breakfast.     loratadine 10 MG tablet  Commonly known as:  CLARITIN  Take 10 mg by mouth daily.     metoprolol tartrate 25 MG tablet  Commonly known as:  LOPRESSOR  Take 12.5 mg by mouth 2 (two) times daily.     mupirocin ointment 2 %  Commonly known as:  BACTROBAN     neomycin-bacitracin-polymyxin [phone] ointment  Place 1 application into the left ear as needed (for  inflammation behind the ear).     nitroGLYCERIN 0.2 mg/hr patch  Commonly known as:  NITRODUR - Dosed in mg/24 hr  Place 1 patch onto the skin daily as needed. Pt does not wear all the time     pantoprazole 40 MG tablet  Commonly known as:  PROTONIX  Take 40 mg by mouth 2 (two) times daily.     phenazopyridine 100 MG tablet  Commonly known as:  PYRIDIUM  Take 100 mg by mouth 3 (three) times daily with meals. As needed     rosuvastatin 10 MG tablet  Commonly known as:  CRESTOR  Take 10 mg by mouth at bedtime.     Thymol Crys  Apply 1 application topically 2 (two) times daily. Used for nails     tobramycin 0.3 % ophthalmic solution  Commonly known as:  TOBREX  Place 1 drop into both eyes daily as needed.     Vitamin D (Ergocalciferol) 50000 UNITS Caps capsule  Commonly known as:  DRISDOL  Take 50,000 Units by mouth every Monday.     warfarin 2.5 MG tablet  Commonly known as:  COUMADIN  Take 1 tablet by mouth daily or as directed     zolpidem 10 MG tablet  Commonly known as:  AMBIEN  Take 5 mg by mouth at bedtime as needed. sleep        Past Medical History  Diagnosis Date  . Hypertension   . Coronary artery disease   . Legally blind   . Chronic kidney disease   . Anemia   . Hypothyroid   . Gastric ulcer   . Hiatal hernia   . Dyspepsia   . UTI (lower urinary tract infection)   . H/O: GI bleed 12//13  . Diastolic dysfunction   . Pacemaker   . High cholesterol   . CHF (congestive heart failure)   . Anginal pain   . MI (myocardial infarction) ? 1994  . DVT of upper extremity (deep vein thrombosis) 06/13/2012    BUE  . Seasonal allergies   . Allergy to perfume     "strong perfumes only" (06/13/2012)  . Shortness of breath     "can happen at any time" (06/13/2012)  . Type II diabetes mellitus   . History of blood transfusion 2013    "not related to OR; BP had dropped" (06/13/2012)  . GERD (gastroesophageal reflux disease)   . Daily headache   .  Arthritis      "all over" (06/13/2012)  . A-fib   . 2Nd degree atrioventricular block     Past Surgical History  Procedure Laterality Date  . Esophagogastroduodenoscopy  12/22/2011    Procedure: ESOPHAGOGASTRODUODENOSCOPY (EGD);  Surgeon: Gatha Mayer, MD;  Location: [facility];  Service: Endoscopy;  Laterality: N/A;  . Cardioversion  06/06/06    successful  . Insert / replace / remove pacemaker  06/29/2006    Medtronic adapta  . Cardiac catheterization  10/25/92  . Cardiac catheterization  11/18/03    w/grafts 100%CX LAD 80 & 100%  . Cardiac catheterization  01/24/05    diffuse disease of native vessels  . Cardiac catheterization  06/06/06    severe native CAD  . Coronary artery bypass graft  10/27/92    LIMA to LAD,SVG to LAD second diagonal,obtuse maraginal of the CX and posterior descendingbranch of the RCA  . Cataract extraction w/ intraocular lens  implant, bilateral Bilateral   . Refractive surgery Bilateral     Family History  Problem Relation Age of Onset  . Breast cancer Daughter   . Diabetes Son   . Diabetes Daughter   . Heart disease Father   . Diabetes Father   . Colon polyps Daughter   . Heart attack Brother   . Diabetes Brother   . Stroke Sister     Social History:  reports that she has never smoked. She has never used smokeless tobacco. She reports that she does not drink alcohol or use illicit drugs.  Allergies:  Allergies  Allergen Reactions  . Darvon     Unknown reaction   . Digoxin And Related Nausea Only  . Penicillins Other (See Comments)    Was told had allergy from childhood... Unknown reaction  . Percocet [Oxycodone-Acetaminophen] Other (See Comments)    confused  . Percodan [Oxycodone-Aspirin]   . Vicodin [Hydrocodone-Acetaminophen] Other (See Comments)    confused    REVIEW of systems:  Hypothyroidism: She is taking only 50 mcg daily with stable TSH  Lab Results  Component Value Date   TSH 1.64 01/24/2013    She has had renal dysfunction which  is long-standing  Has had periodic pedal edema. Does wear elastic stockings  She has significant neuropathy with sensory loss as on her recent foot exam.  Some tingling in fingers Diabetic foot exam was done on 10/18/12   Examination:   BP 118/70  Pulse 74  Temp(Src) 98 F (36.7 C)  Resp 16  SpO2 93%  Cannot calculate BMI with a height equal to zero.   Normal monofilament sensation on distal fingers  Assesment/plan:   Diabetes type 2, long-standing and previously on insulin.   Today her blood sugars are looking fairly good considering her age and multiple medical problems She is taking only low-dose glipizide at suppertime and not clear if she really needs it. Does not check readings after dinner usually and not clear what these are A1c to be checked today Because of her age will not treat her at all with glipizide because of the potential for hypoglycemia, discussed with family She can check her blood sugar once a day at various times  2. Hypothyroidism: She has been on a lower dose than usual for some time and TSH  will be reassessed on the next visit  3. CKD: Creatinine was 1.1 in July,  will repeat  4. She can try extra gabapentin dose during the day if she has tingling in her fingers  Kawan Valladolid 03/25/2013, 10:03 AM   Labs  pending  Appointment on 03/24/2013  Component Date Value Ref Range Status  . Glucose, Bld 03/24/2013 99  70 - 99 mg/dL Final

## 2013-03-24 NOTE — Patient Instructions (Signed)
Less testing glucose, check mostly at 8-10 pm  Stop Glipizide

## 2013-03-26 LAB — FRUCTOSAMINE: FRUCTOSAMINE: 274 umol/L — AB (ref 190–270)

## 2013-03-28 ENCOUNTER — Ambulatory Visit (INDEPENDENT_AMBULATORY_CARE_PROVIDER_SITE_OTHER): Payer: Medicare Other | Admitting: Pharmacist Clinician (PhC)/ Clinical Pharmacy Specialist

## 2013-03-28 DIAGNOSIS — Z7901 Long term (current) use of anticoagulants: Secondary | ICD-10-CM

## 2013-03-28 DIAGNOSIS — I82409 Acute embolism and thrombosis of unspecified deep veins of unspecified lower extremity: Secondary | ICD-10-CM

## 2013-03-28 LAB — POCT INR: INR: 1.5

## 2013-03-31 ENCOUNTER — Telehealth: Payer: Self-pay | Admitting: Pharmacist Clinician (PhC)/ Clinical Pharmacy Specialist

## 2013-03-31 NOTE — Telephone Encounter (Signed)
Pt daughter called, LMOM wanted to clarify warfarin dose for her mother.  Returned call LM on her VM with correct dose in mg and # of tablets

## 2013-04-01 ENCOUNTER — Ambulatory Visit (INDEPENDENT_AMBULATORY_CARE_PROVIDER_SITE_OTHER): Payer: Medicare Other | Admitting: Physician Assistant

## 2013-04-01 ENCOUNTER — Telehealth: Payer: Self-pay | Admitting: Gastroenterology

## 2013-04-01 ENCOUNTER — Other Ambulatory Visit (INDEPENDENT_AMBULATORY_CARE_PROVIDER_SITE_OTHER): Payer: Medicare Other

## 2013-04-01 ENCOUNTER — Encounter: Payer: Self-pay | Admitting: Physician Assistant

## 2013-04-01 VITALS — BP 112/66 | HR 64 | Ht 64.0 in | Wt 192.0 lb

## 2013-04-01 DIAGNOSIS — R300 Dysuria: Secondary | ICD-10-CM

## 2013-04-01 DIAGNOSIS — K625 Hemorrhage of anus and rectum: Secondary | ICD-10-CM

## 2013-04-01 LAB — URINALYSIS
Bilirubin Urine: NEGATIVE
HGB URINE DIPSTICK: NEGATIVE
Ketones, ur: NEGATIVE
Leukocytes, UA: NEGATIVE
NITRITE: NEGATIVE
PH: 6 (ref 5.0–8.0)
Specific Gravity, Urine: 1.015 (ref 1.000–1.030)
Total Protein, Urine: NEGATIVE
Urine Glucose: NEGATIVE
Urobilinogen, UA: 0.2 (ref 0.0–1.0)

## 2013-04-01 MED ORDER — HYDROCORTISONE ACETATE 25 MG RE SUPP: RECTAL | Status: DC

## 2013-04-01 NOTE — Telephone Encounter (Signed)
Patient with painless rectal bleeding.  Dr. Marlou Sa wants her seen today.  Renea Ee will notify the patient of 3:00 appt with Nicoletta Ba PA.  She will also send notes and recent labs

## 2013-04-01 NOTE — Patient Instructions (Signed)
  We sent a prescription to [facility] for Anusol HC Suppositories.

## 2013-04-01 NOTE — Progress Notes (Signed)
Subjective:    Patient ID: [name], female    DOB: 1920/10/27, 80 y.o.   MRN: [id number]  HPI  [name] is a pleasant 80 year old African American female known to Dr. Deatra Ina. She is referred today per Dr. Randel Pigg office for complaints of new onset of painless rectal bleeding. She has history of coronary artery disease, congestive heart failure with EF of 40-45% she is  status post pacemaker placement for heart block, has history of atrial fibrillation and hypertension. She also has history of a DVT area she's currently maintained on Coumadin. Patient had a relatively recent fall and has a fracture of her left lower  extremity. She's currently non-weightbearing. Patient has history of chronic constipation which she has had for many many years and says he only thing that works as Engineer, materials citrate. She takes a bottle of take citrate about every 5 or 6 days. She says that she usually causes cramping but she has good results. She had her mag citrate yesterday straining for bowel movement last night and was noted by her daughter to have bright red blood in the commode and on the tissue. The stool itself appeared normal. Patient had no complaints of rectal discomfort. She had another bowel movement after that without any blood noted and has not had any further bleeding today.  Family wasn't certain about previous colonoscopies however after review of records it's noted that she had colonoscopy with Dr. Oletta Lamas in 2008 and was found to have left colon diverticulosis and internal hemorrhoids. She had undergone upper endoscopy with Dr. Deatra Ina in 2013 with finding of a hiatal hernia and a shallow gastric ulcer.    Review of Systems  Constitutional: Negative.   HENT: Negative.   Eyes: Negative.   Respiratory: Negative.   Cardiovascular: Negative.   Gastrointestinal: Positive for constipation and anal bleeding.  Endocrine: Negative.   Genitourinary: Negative.   Musculoskeletal: Positive for gait  problem.  Skin: Negative.   Allergic/Immunologic: Negative.   Neurological: Negative.   Hematological: Negative.   Psychiatric/Behavioral: Negative.    Outpatient Prescriptions Prior to Visit  Medication Sig Dispense Refill  . ACCU-CHEK FASTCLIX LANCETS MISC 2 (two) times daily.      Marland Kitchen acetaminophen (TYLENOL) 500 MG tablet Take 500-1,000 mg by mouth 2 (two) times daily as needed for mild pain.      Marland Kitchen acetaminophen-codeine (TYLENOL #3) 300-30 MG per tablet Take 1 tablet by mouth 2 (two) times daily as needed for pain.      Marland Kitchen aspirin 81 MG chewable tablet Chew 81 mg by mouth daily.      . Blood Glucose Monitoring Suppl (ACCU-CHEK AVIVA PLUS) W/DEVICE KIT 1 each by Does not apply route as needed.  1 kit  0  . feeding supplement, GLUCERNA SHAKE, (GLUCERNA SHAKE) LIQD Take 237 mLs by mouth 2 (two) times daily between meals.      . folic acid (FOLVITE) 1 MG tablet take 1 tablet by mouth once daily  30 tablet  11  . gabapentin (NEURONTIN) 100 MG capsule Take 100 mg by mouth 2 (two) times daily.       Marland Kitchen glucose blood (ACCU-CHEK AVIVA PLUS) test strip Use to check blood sugars 2 times daily dx code 250.00  200 each  3  . HYDROcodone-acetaminophen (NORCO/VICODIN) 5-325 MG per tablet Take 1 tablet by mouth 2 (two) times daily.      . isosorbide mononitrate (IMDUR) 30 MG 24 hr tablet Take 15 mg by mouth daily.      Marland Kitchen  Lancets Misc. (ACCU-CHEK FASTCLIX LANCET) KIT Use as directec  1 kit  1  . levothyroxine (SYNTHROID, LEVOTHROID) 50 MCG tablet Take 50 mcg by mouth daily before breakfast.      . loratadine (CLARITIN) 10 MG tablet Take 10 mg by mouth daily.      . metoprolol tartrate (LOPRESSOR) 25 MG tablet Take 12.5 mg by mouth 2 (two) times daily.      . mupirocin ointment (BACTROBAN) 2 %       . neomycin-bacitracin-polymyxin (NEOSPORIN) [phone] ointment Place 1 application into the left ear as needed (for inflammation behind the ear).      . nitroGLYCERIN (NITRODUR - DOSED IN MG/24 HR) 0.2 mg/hr  patch Place 1 patch onto the skin daily as needed. Pt does not wear all the time      . pantoprazole (PROTONIX) 40 MG tablet Take 40 mg by mouth 2 (two) times daily.      . rosuvastatin (CRESTOR) 10 MG tablet Take 10 mg by mouth at bedtime.       . Thymol CRYS Apply 1 application topically 2 (two) times daily. Used for nails      . tobramycin (TOBREX) 0.3 % ophthalmic solution Place 1 drop into both eyes daily as needed.       . Vitamin D, Ergocalciferol, (DRISDOL) 50000 UNITS CAPS capsule Take 50,000 Units by mouth every Monday.      . warfarin (COUMADIN) 2.5 MG tablet Take 1 tablet by mouth daily or as directed  90 tablet  1  . zolpidem (AMBIEN) 10 MG tablet Take 5 mg by mouth at bedtime as needed. sleep      . furosemide (LASIX) 40 MG tablet Take 1 tablet (40 mg total) by mouth daily.  3 tablet  0  . glipiZIDE (GLUCOTROL) 2.5 mg TABS tablet Take 0.5 tablets (2.5 mg total) by mouth daily before supper.  30 tablet  3  . phenazopyridine (PYRIDIUM) 100 MG tablet Take 100 mg by mouth 3 (three) times daily with meals. As needed       No facility-administered medications prior to visit.   Allergies  Allergen Reactions  . Darvon     Unknown reaction   . Digoxin And Related Nausea Only  . Penicillins Other (See Comments)    Was told had allergy from childhood... Unknown reaction  . Percocet [Oxycodone-Acetaminophen] Other (See Comments)    confused  . Percodan [Oxycodone-Aspirin]   . Vicodin [Hydrocodone-Acetaminophen] Other (See Comments)    confused   Patient Active Problem List   Diagnosis Date Noted  . Dyspnea 02/23/2013  . A-fib 11/19/2012  . Sepsis 11/18/2012  . DVT of upper extremity (deep vein thrombosis) 11/18/2012  . Musculoskeletal chest pain 11/01/2012  . Type II or unspecified type diabetes mellitus without mention of complication, not stated as uncontrolled 07/21/2012  . Unspecified hypothyroidism 07/21/2012  . Chronic diastolic heart failure 02/72/5366  . Long term  (current) use of anticoagulants 06/20/2012  . Acute pulmonary embolism 06/18/2012  . DVT (deep venous thrombosis) 06/13/2012  . Fever 02/22/2012  . Malnutrition 01/01/2012  . Dyspepsia 12/30/2011  . UTI (lower urinary tract infection) 12/29/2011  . Chest pain 12/29/2011  . Hiatal hernia 12/29/2011  . Gastric ulcer 12/22/2011  . Fatigue 12/21/2011  . Pain, foot, right, chronic 12/21/2011  . Anemia of chronic disease 12/19/2011  . Nausea with vomiting 12/19/2011  . Anorexia 12/19/2011  . Infection, staphylococcal 12/18/2011  . Bacterial UTI 12/18/2011  . Sepsis due to other specified  Staphylococcus 12/18/2011  . Hypertension 12/14/2011  . Hypotension 12/14/2011  . Acute on chronic renal failure 12/14/2011  . Pacemaker, medtronic Adapta 06/29/06  secondary to symptomatic bradycardia and 2nd degree heart block 12/14/2011  . Hyperkalemia 12/14/2011  . Hyponatremia 12/14/2011  . Microcytic anemia 10/19/2011  . Abnormal weight loss 10/19/2011  . Dyspepsia and other specified disorders of function of stomach 10/19/2011  . Diabetes mellitus 10/19/2011  . Coronary artery disease, CABG in 1994 with last cath 2008 patent grafts and normal EF 10/19/2011   History  Substance Use Topics  . Smoking status: Never Smoker   . Smokeless tobacco: Never Used  . Alcohol Use: No   family history includes Breast cancer in her daughter; Colon polyps in her daughter; Diabetes in her brother, daughter, father, and son; Heart attack in her brother; Heart disease in her father; Stroke in her sister.     Objective:   Physical Exam  well-developed elderly African American female in no acute distress in a wheelchair accompanied by family members, Blood pressure 112/66 pulse 64 height 5 foot 4 weight 192. HEENT; nontraumatic normocephalic EOMI PERRLA sclera anicteric, Neck; supple,no  JVD, Cardiovascular; regular rate and rhythm with S1-S2 no murmur or gallop, Pulmonary ;clear bilaterally, Abdomen ;obese soft  nontender nondistended bowel sounds are active there is no palpable mass or hepatosplenomegaly, Rectal ;exam no external lesion noted on digital exam she's nontender no lesion felt stool is brown and Hemoccult negative, Extremities; patient has a cast on her left lower ext, she is nonambulatory, Psych; mood and affect appropriate        Assessment & Plan:  #21  80 year old female with isolated episode of hematochezia associated with constipation and straining. I suspect she had bleeding from an internal hemorrhoid. There is no evidence of ongoing bleeding at this time. #2 chronic constipation #3 chronic anticoagulation #4 History of DVT #5 history of atrial fibrillation #6 coronary artery disease, status post CABG #7 history of heart block status post pacemaker #8 congestive heart failure #9 left lower Ext fracture #10 diabetes mellitus  Plan; Anusol-HC suppositories at bedtime x7 days then as needed for recurrent bleeding. Patient and family advised that should she have any significant recurrence of bleeding or grossly bloody stools in the future to have her assessed in the emergency room in light of chronic anticoagulation.

## 2013-04-02 NOTE — Progress Notes (Signed)
Reviewed and agree with management. Khloee Garza D. Lycan Davee, M.D., FACG  

## 2013-04-11 ENCOUNTER — Ambulatory Visit (INDEPENDENT_AMBULATORY_CARE_PROVIDER_SITE_OTHER): Payer: Medicare Other | Admitting: Pharmacist Clinician (PhC)/ Clinical Pharmacy Specialist

## 2013-04-11 ENCOUNTER — Telehealth: Payer: Self-pay | Admitting: Endocrinology

## 2013-04-11 DIAGNOSIS — I82409 Acute embolism and thrombosis of unspecified deep veins of unspecified lower extremity: Secondary | ICD-10-CM

## 2013-04-11 DIAGNOSIS — Z7901 Long term (current) use of anticoagulants: Secondary | ICD-10-CM

## 2013-04-11 LAB — POCT INR: INR: 1.6

## 2013-04-11 NOTE — Telephone Encounter (Signed)
Please see below and advise.

## 2013-04-11 NOTE — Telephone Encounter (Signed)
Dr. Dwyane Dee took this patient off of her Glipizide and now her b/s is very high   Please advise  Call [phone]  Thank you :)

## 2013-04-11 NOTE — Telephone Encounter (Signed)
Last 5 readings needed

## 2013-04-14 ENCOUNTER — Telehealth: Payer: Self-pay | Admitting: *Deleted

## 2013-04-14 NOTE — Telephone Encounter (Signed)
Says her mother accidentally was given too much coumadin on Friday.  She needs to know what to do about how to dose now.  Please call.

## 2013-04-14 NOTE — Telephone Encounter (Signed)
Erasmo Downer, PharmD notified and advised pt take regularly scheduled dose tonight and continue w/ current dose plan.  Keep next scheduled check.  Call to [name], pt's daughter, and informed.  Verbalized understanding and agreed w/ plan.

## 2013-04-14 NOTE — Telephone Encounter (Signed)
Returned call and pt verified x 2 w/ pt's daughter, [name].  Stated pt DID have two doses of Coumadin at 3.75 mg on Friday.  Stated she gave it to pt before church and refilled pt's pill box.  Stated her brother gave it to her again later that night b/c she did not communicate w/ him and he saw the pill box wasn't empty.  Stated they held it on Saturday and Sunday.  Wanted to know if pt needs to come in to be checked or not.  Informed Erasmo Downer, PharmD will be notified for further instructions and she will receive a call back.  Also informed of after hours on-call providers and advised to call the office in the future if something like this happens so that she is not having do dose pt's meds w/o approval.  Verbalized understanding and agreed w/ plan.

## 2013-04-14 NOTE — Telephone Encounter (Signed)
Pt's daughter has a question about [name] coumadin. She thinks that she might have gave her too much.

## 2013-04-17 ENCOUNTER — Other Ambulatory Visit: Payer: Self-pay | Admitting: Internal Medicine

## 2013-04-17 DIAGNOSIS — N644 Mastodynia: Secondary | ICD-10-CM

## 2013-04-21 ENCOUNTER — Telehealth: Payer: Self-pay | Admitting: Cardiovascular Disease

## 2013-04-21 MED ORDER — FOLIC ACID 1 MG PO TABS: 1.0000 mg | ORAL_TABLET | Freq: Every day | ORAL | Status: DC

## 2013-04-21 NOTE — Telephone Encounter (Signed)
Refills sent to pharmacy. 

## 2013-04-21 NOTE — Telephone Encounter (Signed)
She said [facility] told her to call and tell us please send her mother Folic Acid asap.They said it was faxed to [place] last night.She will run of her medicine in the next few days. Please call to [phone]

## 2013-04-25 ENCOUNTER — Other Ambulatory Visit: Payer: Self-pay | Admitting: Internal Medicine

## 2013-04-25 ENCOUNTER — Ambulatory Visit
Admission: RE | Admit: 2013-04-25 | Discharge: 2013-04-25 | Disposition: A | Discharge status: Home / Self Care | Payer: Self-pay | Source: Ambulatory Visit | Attending: Internal Medicine | Admitting: Internal Medicine

## 2013-04-25 ENCOUNTER — Ambulatory Visit
Admission: RE | Admit: 2013-04-25 | Discharge: 2013-04-25 | Disposition: A | Discharge status: Home / Self Care | Payer: Medicare Other | Source: Ambulatory Visit | Attending: Internal Medicine | Admitting: Internal Medicine

## 2013-04-25 DIAGNOSIS — N644 Mastodynia: Secondary | ICD-10-CM

## 2013-05-02 ENCOUNTER — Ambulatory Visit (INDEPENDENT_AMBULATORY_CARE_PROVIDER_SITE_OTHER): Payer: Medicare Other | Admitting: Pharmacist Clinician (PhC)/ Clinical Pharmacy Specialist

## 2013-05-02 DIAGNOSIS — I82409 Acute embolism and thrombosis of unspecified deep veins of unspecified lower extremity: Secondary | ICD-10-CM

## 2013-05-02 DIAGNOSIS — Z7901 Long term (current) use of anticoagulants: Secondary | ICD-10-CM

## 2013-05-02 LAB — POCT INR: INR: 2.2

## 2013-05-02 NOTE — Patient Instructions (Signed)
Switch from chewable aspirin to enteric coated (samples)  Try OTC omeprazole 20mg  twice daily or Nexium 40mg  once daily for heartburn/reflux

## 2013-05-05 ENCOUNTER — Telehealth: Payer: Self-pay | Admitting: Cardiovascular Disease

## 2013-05-07 NOTE — Telephone Encounter (Signed)
Closed encounter °

## 2013-05-10 IMAGING — CR DG FOOT COMPLETE 3+V*R*
1 series · 3 of 3 positions shown · non-contrast
Comparison: None.

CLINICAL DATA: Right foot pain.  No known injury.

RIGHT FOOT COMPLETE - 3+ VIEW

[Series 1: AP · right · 3 of 3 slices shown]
[im 1/3]
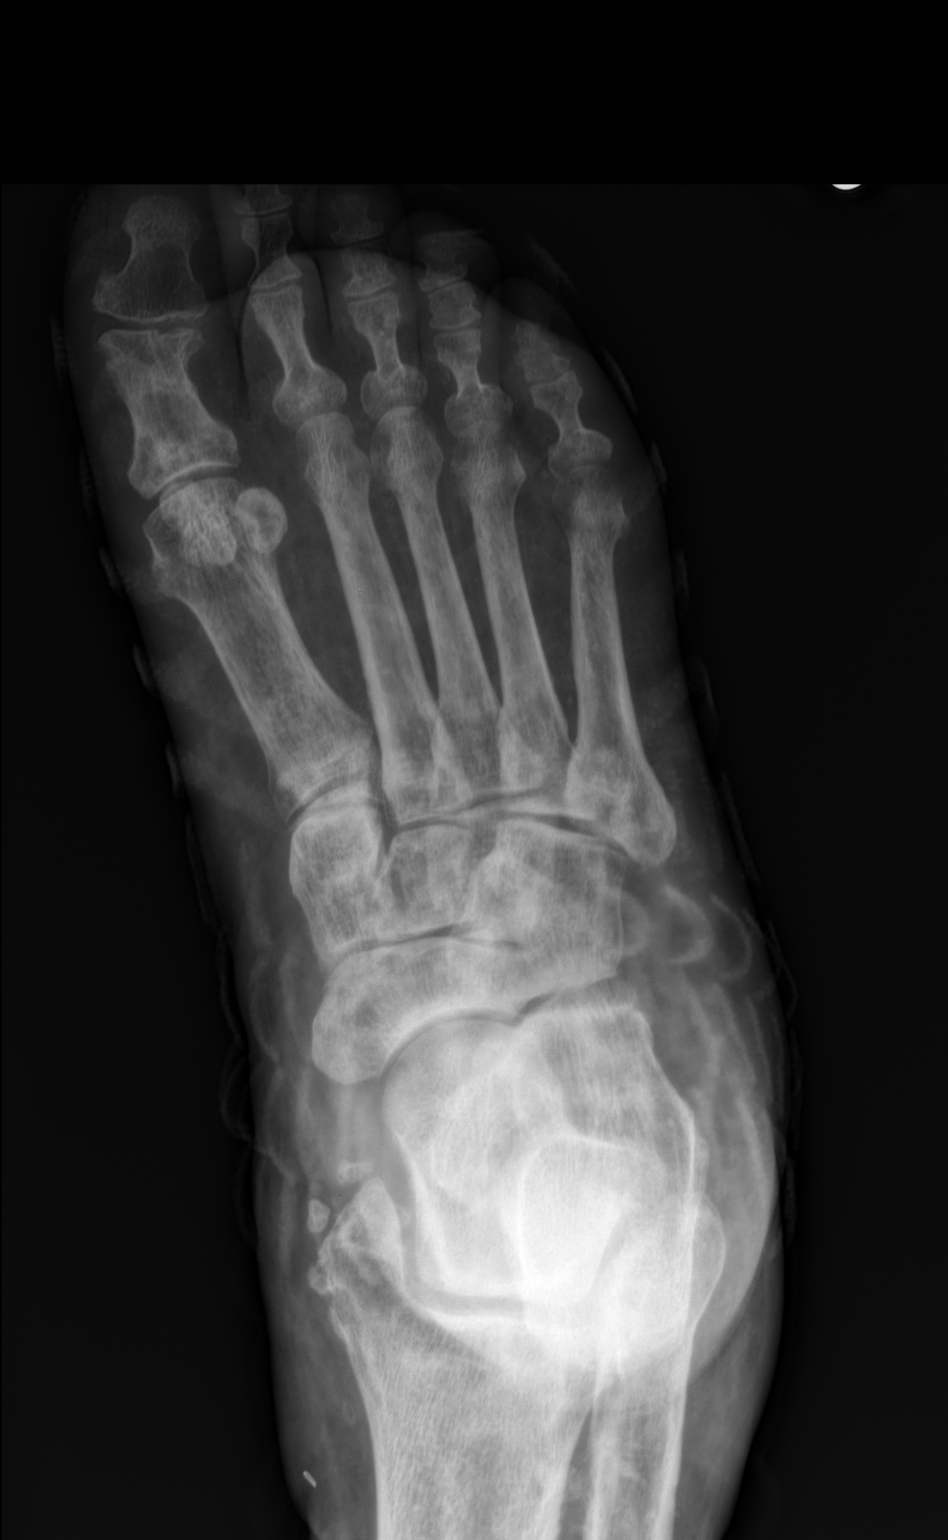
[im 2/3]
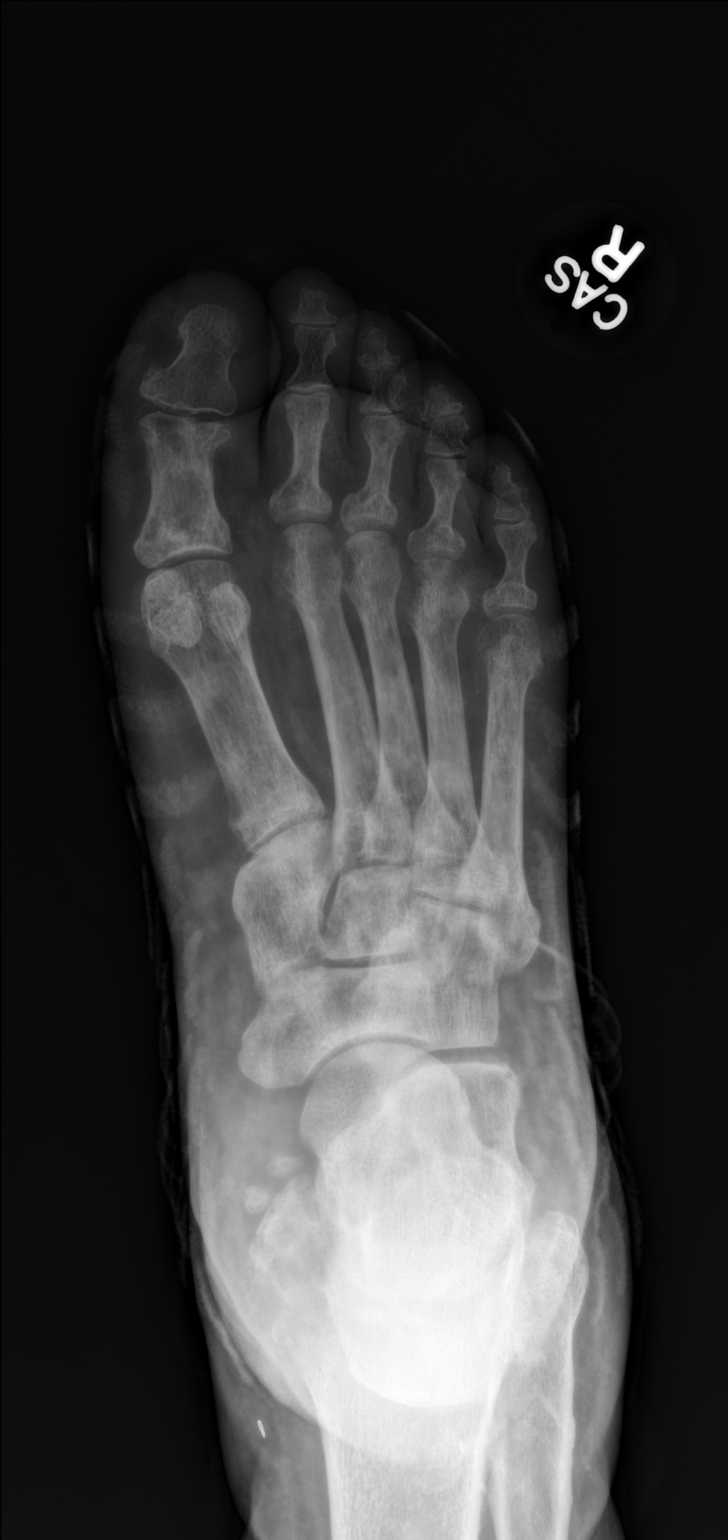
[im 3/3]
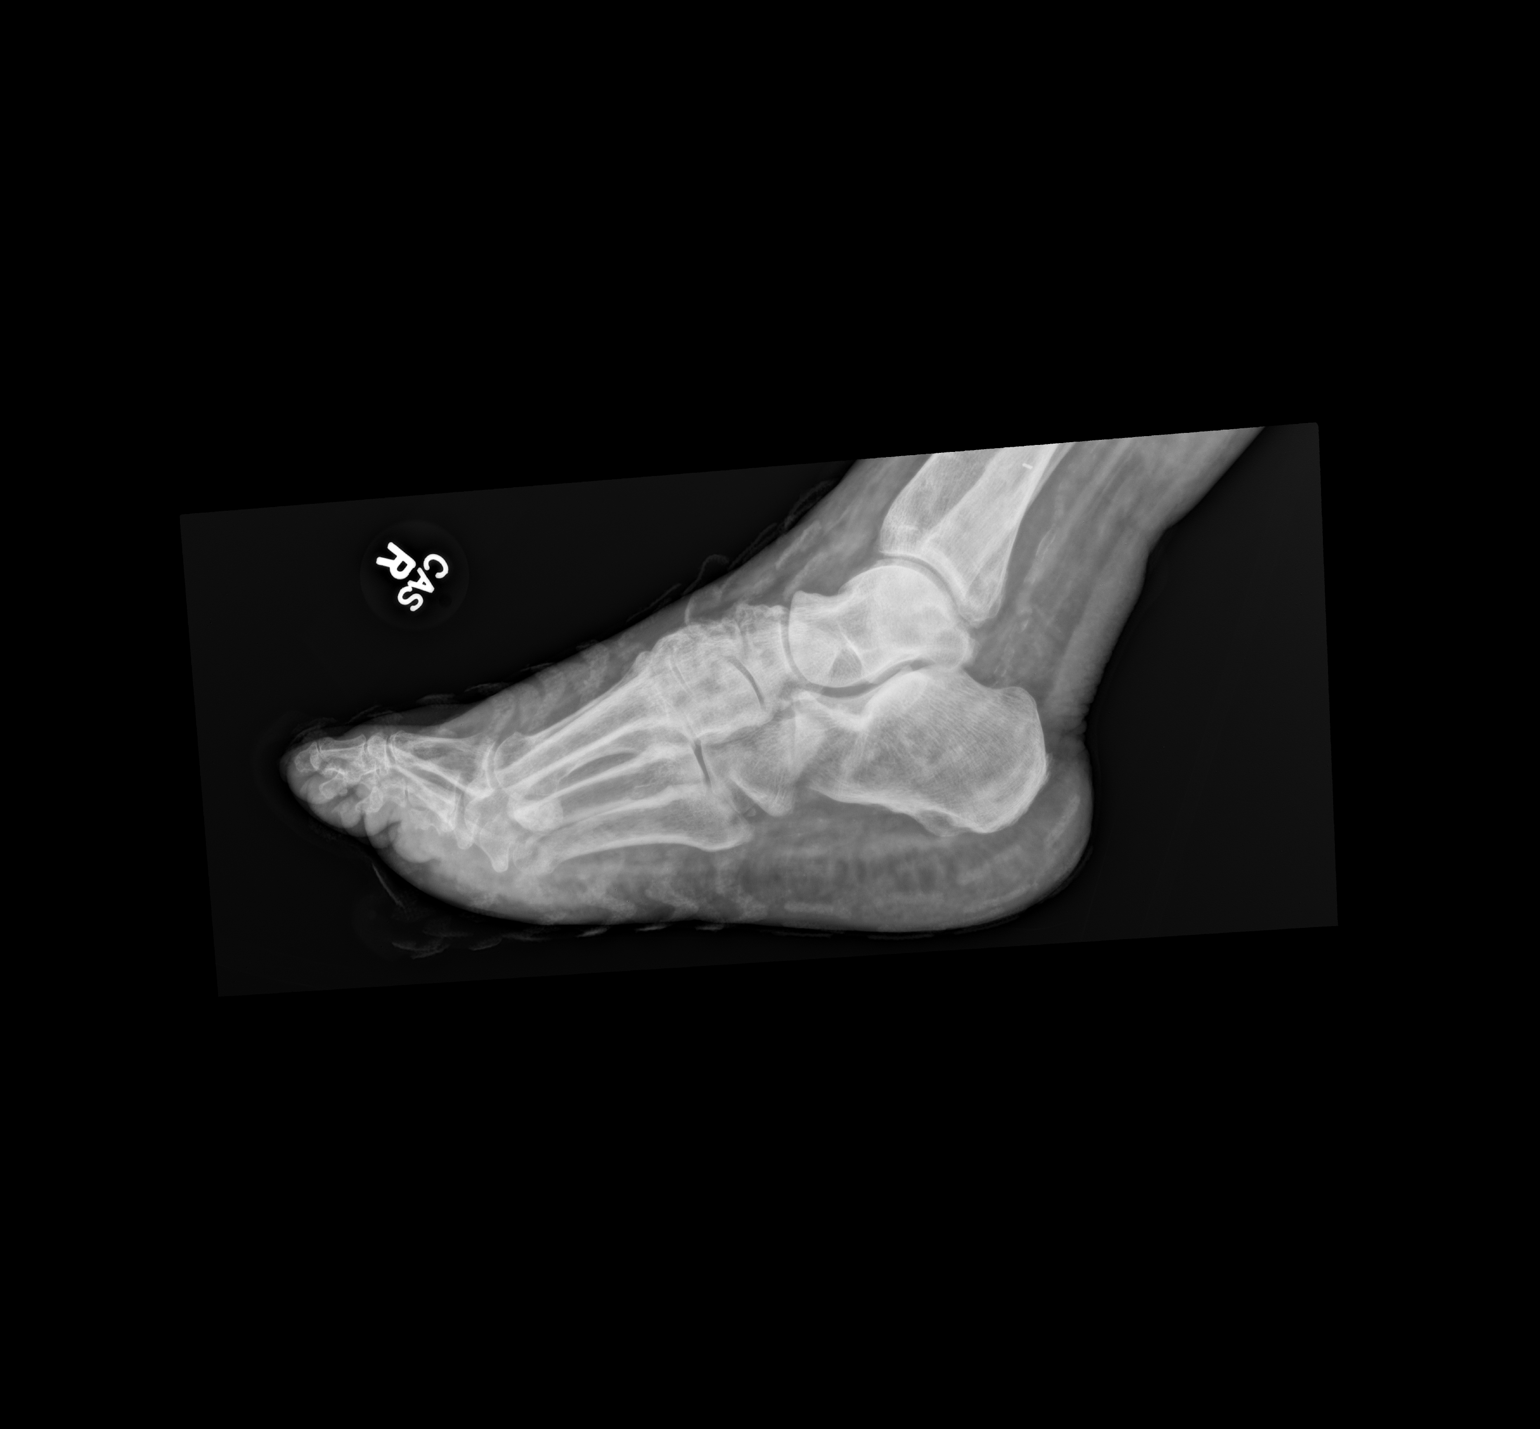

[3 of 3 positions shown; findings below may reference images not displayed]

FINDINGS: Sock containing radiopaque material limits fine bone
detail.  There is no evidence of acute fracture or dislocation.  No
other significant bone abnormality identified.  Peripheral vascular
calcification noted.
IMPRESSION: No acute findings.

## 2013-05-10 IMAGING — CT CT ABD-PELV W/O CM
1 of 2 series · 15 of 32 positions shown, 19 images · non-contrast
Comparison: 06/16/2011

CLINICAL DATA: Severe nausea and vomiting.  Abdominal pain.

CT ABDOMEN AND PELVIS WITHOUT CONTRAST
TECHNIQUE: Multidetector CT imaging of the abdomen and pelvis was
performed following the standard protocol without intravenous
contrast.

[Series 2: abd/pel w/o · axial · non-contrast · 0.77mm/px · z∈[-550,-135]mm · 15 of 91 slices shown, 19 images]
[im 4/91  soft-tissue]
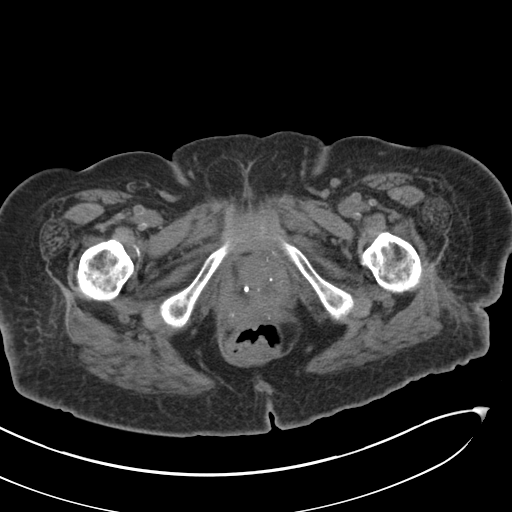
[im 4/91  bone]
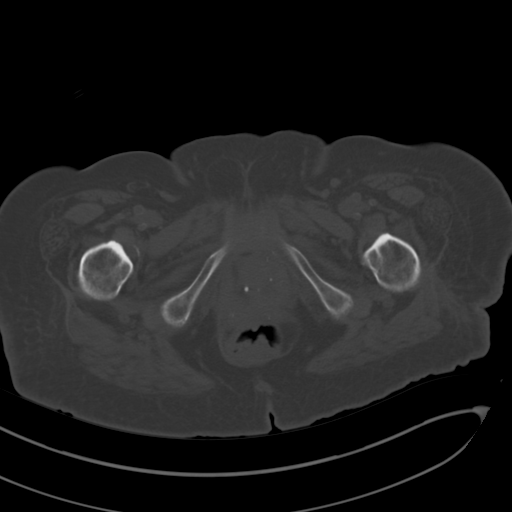
[im 12/91  soft-tissue]
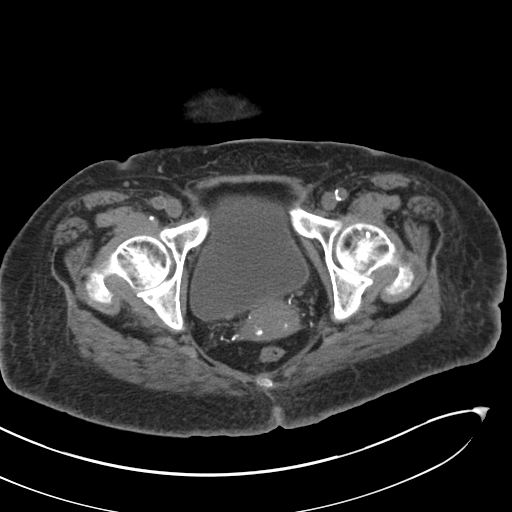
[im 19/91  soft-tissue]
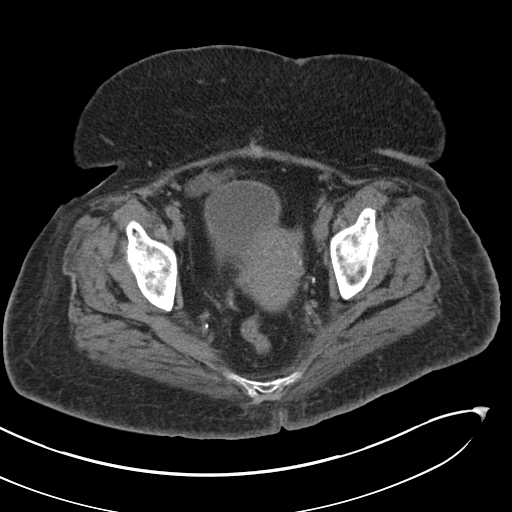
[im 27/91  soft-tissue]
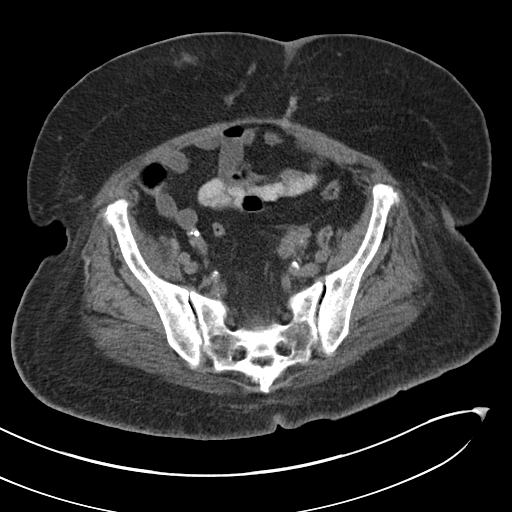
[im 31/91  soft-tissue]
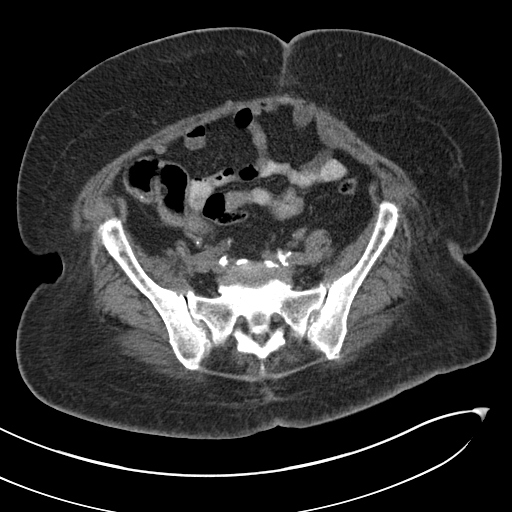
[im 38/91  soft-tissue]
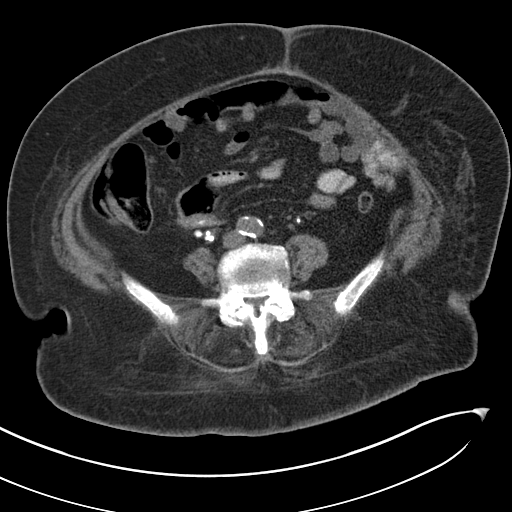
[im 46/91  soft-tissue]
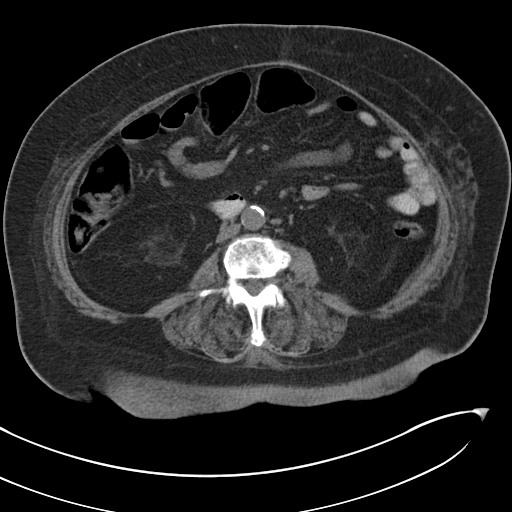
[im 53/91  soft-tissue]
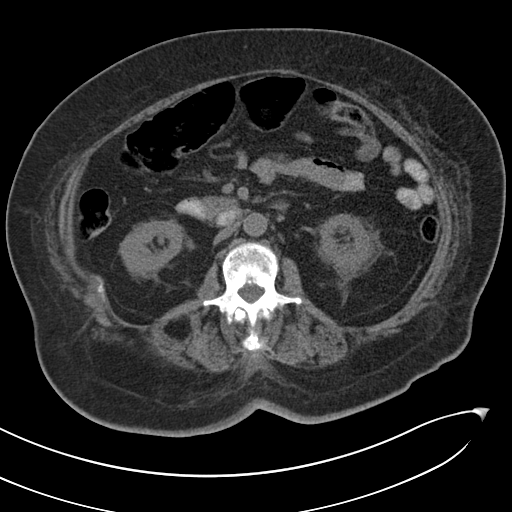
[im 61/91  soft-tissue]
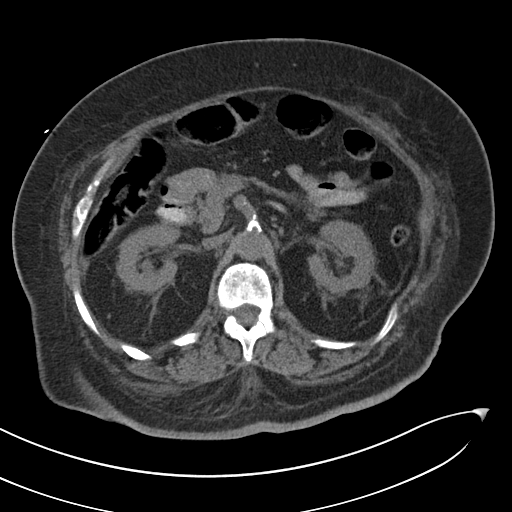
[im 61/91  bone]
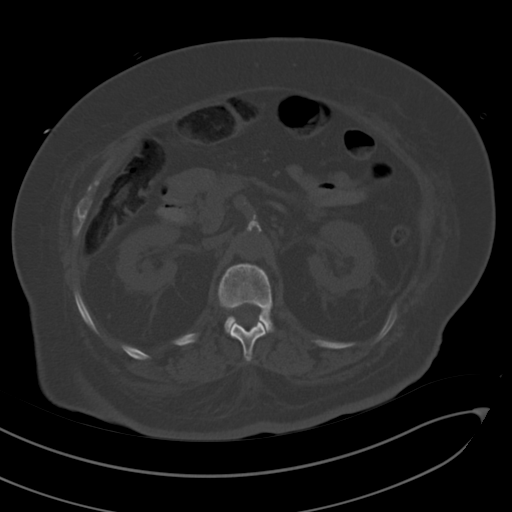
[im 64/91  soft-tissue]
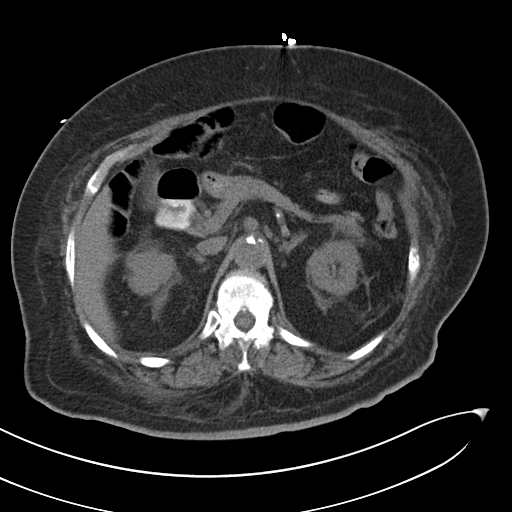
[im 72/91  soft-tissue]
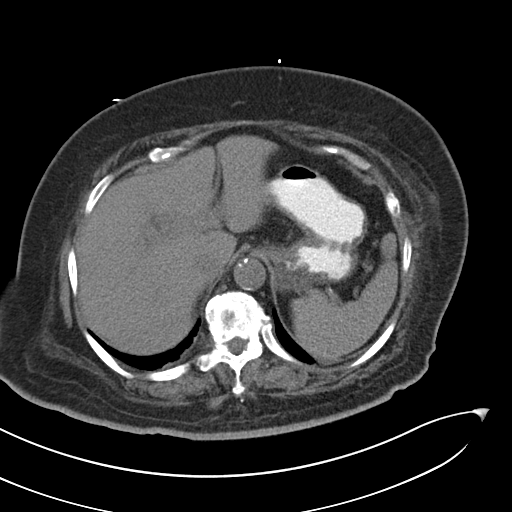
[im 76/91  lung]
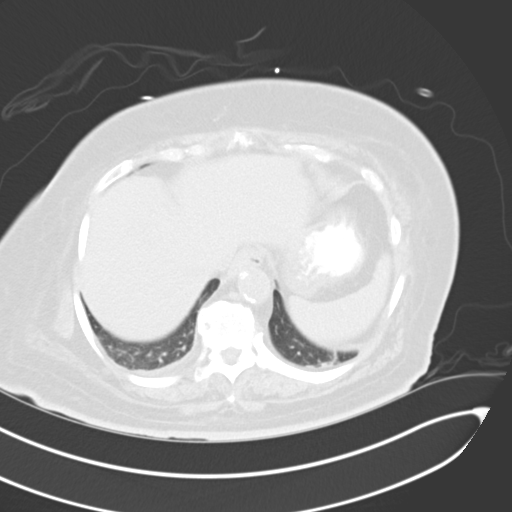
[im 79/91  soft-tissue]
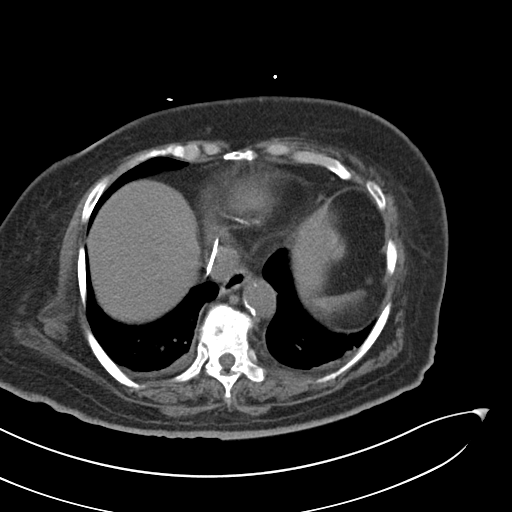
[im 79/91  lung]
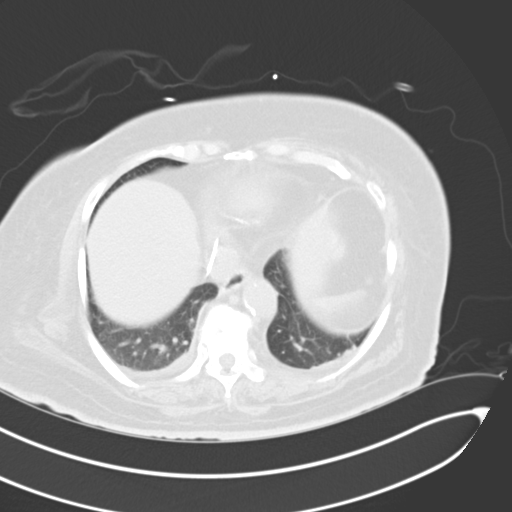
[im 83/91  lung]
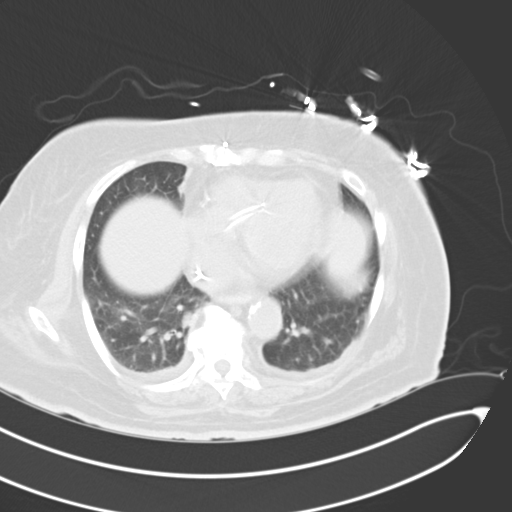
[im 87/91  soft-tissue]
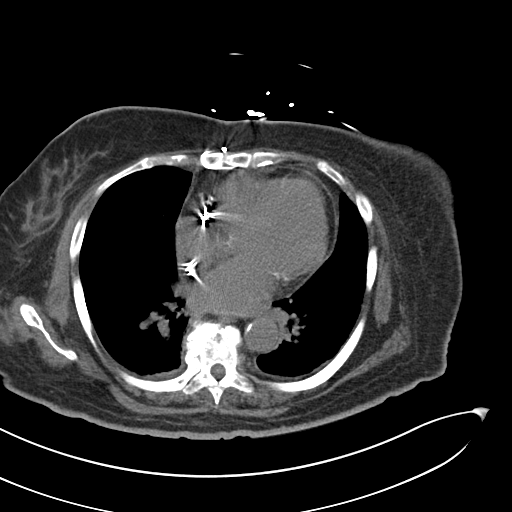
[im 87/91  lung]
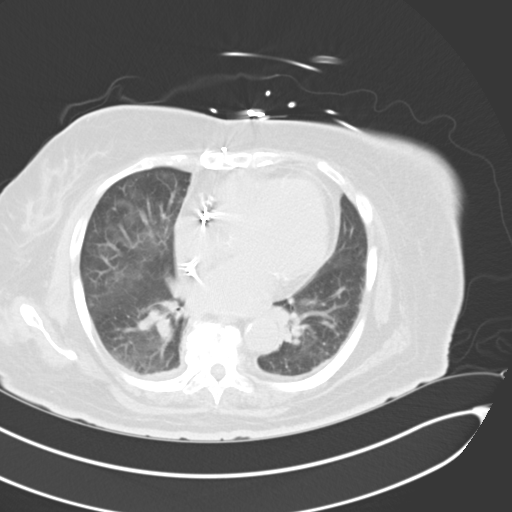

[15 of 32 positions shown; findings below may reference images not displayed]

FINDINGS: Probable tiny calcified gallstones again noted, however
there is no evidence of cholecystitis or biliary ductal dilatation.
Noncontrast images of the liver, pancreas, spleen, adrenal glands,
and kidneys are unremarkable in appearance.  Chronic bilateral
perinephric stranding again demonstrated as well as probable tiny
fluid attenuation left renal cyst.  No evidence of renal calculi or
hydronephrosis.

No soft tissue masses or lymphadenopathy identified within the
abdomen or pelvis.  Uterus and adnexal regions are unremarkable.
No evidence of inflammatory process or abnormal fluid collections.
No evidence of dilated bowel loops or hernia.  Cardiomegaly and
diffuse coronary artery calcifications again demonstrated, and
pacemaker leads again noted in the right heart.
IMPRESSION: 1.  Stable exam.  No acute findings.
2.  Cholelithiasis, without evidence of acute cholecystitis.

## 2013-05-23 ENCOUNTER — Telehealth: Payer: Self-pay | Admitting: Pharmacist Clinician (PhC)/ Clinical Pharmacy Specialist

## 2013-05-23 NOTE — Telephone Encounter (Signed)
Dtr wanted to know name of OTC product to replace protonix.  Advised to try prilosec - store generic should be fine/

## 2013-05-28 ENCOUNTER — Ambulatory Visit (INDEPENDENT_AMBULATORY_CARE_PROVIDER_SITE_OTHER): Payer: Medicare Other | Admitting: Pharmacist Clinician (PhC)/ Clinical Pharmacy Specialist

## 2013-05-28 DIAGNOSIS — Z7901 Long term (current) use of anticoagulants: Secondary | ICD-10-CM

## 2013-05-28 DIAGNOSIS — I82409 Acute embolism and thrombosis of unspecified deep veins of unspecified lower extremity: Secondary | ICD-10-CM

## 2013-05-28 LAB — POCT INR: INR: 1.5

## 2013-06-04 ENCOUNTER — Other Ambulatory Visit: Payer: Self-pay | Admitting: *Deleted

## 2013-06-04 ENCOUNTER — Telehealth: Payer: Self-pay | Admitting: Cardiovascular Disease

## 2013-06-04 MED ORDER — OMEPRAZOLE 20 MG PO CPDR: 20.0000 mg | DELAYED_RELEASE_CAPSULE | Freq: Every day | ORAL | Status: DC

## 2013-06-04 NOTE — Telephone Encounter (Signed)
Please call-concerning her generic lasix.

## 2013-06-04 NOTE — Telephone Encounter (Signed)
Yes, I think we can do lasix 40 mg and KCl 20 mEq once daily for three days, please

## 2013-06-04 NOTE — Telephone Encounter (Signed)
Dtr wondering about generic lasix.  [name] has been having increased swelling in her legs this week, however she sat in car to [place] to attend a funeral, then ate at [employer] before driving home.  Swelling is not painful.  Has previously been given lasix + potassium x 3 days supply to help with swelling.  Should she continue?  Will refer to Dr. Loletha Grayer.

## 2013-06-05 MED ORDER — FUROSEMIDE 40 MG PO TABS: ORAL_TABLET | ORAL | Status: DC

## 2013-06-05 MED ORDER — POTASSIUM CHLORIDE CRYS ER 20 MEQ PO TBCR: EXTENDED_RELEASE_TABLET | ORAL | Status: DC

## 2013-06-05 NOTE — Telephone Encounter (Signed)
Spoke with son, explained that they need to use lasix and potassium x 3 days.  He states she has some baseline swelling, but worsens from time to time.  Will send rx for #30 tabs of each.  He is aware that this is not an every day medication.

## 2013-06-13 ENCOUNTER — Ambulatory Visit (INDEPENDENT_AMBULATORY_CARE_PROVIDER_SITE_OTHER): Payer: Medicare Other | Admitting: Pharmacist Clinician (PhC)/ Clinical Pharmacy Specialist

## 2013-06-13 DIAGNOSIS — I82409 Acute embolism and thrombosis of unspecified deep veins of unspecified lower extremity: Secondary | ICD-10-CM

## 2013-06-13 DIAGNOSIS — Z7901 Long term (current) use of anticoagulants: Secondary | ICD-10-CM

## 2013-06-13 LAB — POCT INR: INR: 2.5

## 2013-06-18 ENCOUNTER — Telehealth: Payer: Self-pay | Admitting: Cardiovascular Disease

## 2013-06-18 ENCOUNTER — Ambulatory Visit: Payer: Medicare Other | Admitting: Pharmacist Clinician (PhC)/ Clinical Pharmacy Specialist

## 2013-06-18 NOTE — Telephone Encounter (Signed)
SPOKE TO DAUGHTER STATES SHE NOTICE THAT MOTHER 'S STOMACH BIGGER THAN NORMAL, USUAL DISCOFORT UNDER L BREAST  NO NAUSEA, NO DIARRHEA , NO BOWEL MOVEMENT IN ABOUT A WEEK PER DAUGHTER. SHE STATES PATIENT RECEIVED A LAXATIVE TODAY. DAUGHTER STATES PATIENT DOES NOT HAVE A BOWEL MOVEMENT WITH OUT A LAXATIVE. NO CHEST PAIN OR SOB. RN RECOMMEND DAUGHTER CONTACT PCP. SYMPTOMS DO NOT SOUND CARDIAC IN NATURE. DAUGHTER STATES OFFICE CLOSE TODAY. RN ASKED DAUGHTER TO CALL OFFICE BACK THERE SHOULD BE SOMEONE ON CALL FOR HIM. SHE VERBALIZED UNDERSTANDING.

## 2013-06-18 NOTE — Telephone Encounter (Signed)
Has some questions about her mom  . Her stomach is big. Bigger than normal .. Please Calll    Thanks

## 2013-06-20 ENCOUNTER — Encounter: Payer: Self-pay | Admitting: *Deleted

## 2013-06-24 ENCOUNTER — Encounter: Payer: Self-pay | Admitting: Endocrinology

## 2013-06-24 ENCOUNTER — Ambulatory Visit (INDEPENDENT_AMBULATORY_CARE_PROVIDER_SITE_OTHER): Payer: Medicare Other | Admitting: Endocrinology

## 2013-06-24 VITALS — BP 128/60 | HR 75 | Temp 98.2°F | Resp 14

## 2013-06-24 DIAGNOSIS — E119 Type 2 diabetes mellitus without complications: Secondary | ICD-10-CM

## 2013-06-24 DIAGNOSIS — I10 Essential (primary) hypertension: Secondary | ICD-10-CM

## 2013-06-24 DIAGNOSIS — E039 Hypothyroidism, unspecified: Secondary | ICD-10-CM

## 2013-06-24 LAB — HEMOGLOBIN A1C: Hgb A1c MFr Bld: 7.6 % — ABNORMAL HIGH (ref 4.6–6.5)

## 2013-06-24 LAB — T4, FREE: Free T4: 0.65 ng/dL (ref 0.60–1.60)

## 2013-06-24 LAB — LIPID PANEL
CHOL/HDL RATIO: 4
Cholesterol: 143 mg/dL (ref 0–200)
HDL: 39.6 mg/dL (ref >39.00)
LDL CALC: 61 mg/dL (ref 0–99)
NonHDL: 103.4
Triglycerides: 212 mg/dL — ABNORMAL HIGH (ref 0.0–149.0)
VLDL: 42.4 mg/dL — ABNORMAL HIGH (ref 0.0–40.0)

## 2013-06-24 LAB — BASIC METABOLIC PANEL
BUN: 16 mg/dL (ref 6–23)
CO2: 27 meq/L (ref 19–32)
CREATININE: 1.1 mg/dL (ref 0.4–1.2)
Calcium: 9 mg/dL (ref 8.4–10.5)
Chloride: 101 mEq/L (ref 96–112)
GFR: 59.25 mL/min — ABNORMAL LOW (ref >60.00)
Glucose, Bld: 240 mg/dL — ABNORMAL HIGH (ref 70–99)
Potassium: 4.5 mEq/L (ref 3.5–5.1)
SODIUM: 136 meq/L (ref 135–145)

## 2013-06-24 LAB — TSH: TSH: 4.51 u[IU]/mL — AB (ref 0.35–4.50)

## 2013-06-24 NOTE — Progress Notes (Signed)
Patient ID: [name], female   DOB: 1920-05-02, 80 y.o.   MRN: [id number]   Reason for Appointment: Diabetes follow-up   History of Present Illness   Diagnosis: Type 2 diabetes mellitus, date of diagnosis: 1972.   She had been on insulin for several years and this had been tapered off in 2013 because of weight loss and lower blood sugars. Subsequently blood sugars had been generally reasonably good.  Because of her age and multiple medical problems she has been monitored without insulin  Recent history: Her blood sugars are still looking fairly good overall and because of higher readings after meals she was given low-dose glipizide, 2.5 mg in 1/15. A1c was  higher at 7.8 previously Although she is taking the glipizide fairly regularly in 3/15 She was asked to check her blood sugars after supper to verify postprandial hyperglycemia Appears that her blood sugars are usually not that high even after eating and her son has been giving her glipizide only when the blood sugar is about 160 or more She has had only rare blood sugars over 200 with eating sweets Blood sugars are slightly higher overall than the last time but still reasonably good She has been told to have a liberal diet because of her age No hypoglycemia Her weight is slightly lower again, was up to 197 pounds  Monitors blood glucose: 1-2 times daily.  Glucometer: Accucheck Aviva.  Blood Glucose readings:  PREMEAL Breakfast Lunch Dinner Bedtime Overall  Glucose range:  91-157   121-177   95-215   133-186      Average       145   Hypoglycemia frequency: None .  Meals: 2 meals at noon and 7 pm; sometimes has lemonade or sprite. Has Glucerna at dinner at times  Physical activity: exercise:none.  Dietician visit: Most recent:2001.   Complications: peripheral neuropathy. Urine microalbumin normal previously  193.4 at home  Wt Readings from Last 3 Encounters:  04/01/13 192 lb (87.091 kg)  02/20/13 197 lb (89.359  kg)  01/28/13 192 lb 9.6 oz (87.363 kg)   Lab Results  Component Value Date   HGBA1C 7.8* 01/24/2013   HGBA1C 8.0* 11/18/2012   HGBA1C 6.9* 06/13/2012   Lab Results  Component Value Date   [place] 95 01/24/2013   CREATININE 1.36* 02/14/2013       Medication List       This list is accurate as of: 06/24/13 11:22 AM.  Always use your most recent med list.               ACCU-CHEK AVIVA PLUS W/DEVICE Kit  1 each by Does not apply route as needed.     ACCU-CHEK FASTCLIX LANCET Kit  Use as directec     ACCU-CHEK FASTCLIX LANCETS Misc  2 (two) times daily.     acetaminophen 500 MG tablet  Commonly known as:  TYLENOL  Take 500-1,000 mg by mouth 2 (two) times daily as needed for mild pain.     acetaminophen-codeine 300-30 MG per tablet  Commonly known as:  TYLENOL #3  Take 1 tablet by mouth 2 (two) times daily as needed for pain.     aspirin 81 MG chewable tablet  Chew 81 mg by mouth daily.     feeding supplement (GLUCERNA SHAKE) Liqd  Take 237 mLs by mouth 2 (two) times daily between meals.     folic acid 1 MG tablet  Commonly known as:  FOLVITE  Take 1 tablet (1 mg total)  by mouth daily.     furosemide 40 MG tablet  Commonly known as:  LASIX  Take 1 tablet daily x 3 days then as directed     gabapentin 100 MG capsule  Commonly known as:  NEURONTIN  Take 100 mg by mouth 2 (two) times daily.     glucose blood test strip  Commonly known as:  ACCU-CHEK AVIVA PLUS  Use to check blood sugars 2 times daily dx code 250.00     HYDROcodone-acetaminophen 5-325 MG per tablet  Commonly known as:  NORCO/VICODIN  Take 1 tablet by mouth 2 (two) times daily.     hydrocortisone 25 MG suppository  Commonly known as:  ANUSOL-HC  Use 1 suppository rectally at bedtime for 7 days.     isosorbide mononitrate 30 MG 24 hr tablet  Commonly known as:  IMDUR  Take 15 mg by mouth daily.     levothyroxine 50 MCG tablet  Commonly known as:  SYNTHROID, LEVOTHROID  Take 50 mcg by mouth  daily before breakfast.     loratadine 10 MG tablet  Commonly known as:  CLARITIN  Take 10 mg by mouth daily.     metoprolol tartrate 25 MG tablet  Commonly known as:  LOPRESSOR  Take 12.5 mg by mouth 2 (two) times daily.     mupirocin ointment 2 %  Commonly known as:  BACTROBAN     neomycin-bacitracin-polymyxin [phone] ointment  Place 1 application into the left ear as needed (for inflammation behind the ear).     nitroGLYCERIN 0.2 mg/hr patch  Commonly known as:  NITRODUR - Dosed in mg/24 hr  Place 1 patch onto the skin daily as needed. Pt does not wear all the time     omeprazole 20 MG capsule  Commonly known as:  PRILOSEC  Take 1 capsule (20 mg total) by mouth daily.     pantoprazole 40 MG tablet  Commonly known as:  PROTONIX  Take 40 mg by mouth 2 (two) times daily.     PATADAY 0.2 % Soln  Generic drug:  Olopatadine HCl     potassium chloride SA 20 MEQ tablet  Commonly known as:  K-DUR,KLOR-CON  Take 1 tablet daily x 3 days with furosemide then as directed     rosuvastatin 10 MG tablet  Commonly known as:  CRESTOR  Take 10 mg by mouth at bedtime.     Thymol Crys  Apply 1 application topically 2 (two) times daily. Used for nails     tobramycin 0.3 % ophthalmic solution  Commonly known as:  TOBREX  Place 1 drop into both eyes daily as needed.     Vitamin D (Ergocalciferol) 50000 UNITS Caps capsule  Commonly known as:  DRISDOL  Take 50,000 Units by mouth every Monday.     warfarin 2.5 MG tablet  Commonly known as:  COUMADIN  Take 1 1/2 tablet Monday Wednesday Friday and Saturday and 1 tablet on Tuesday Thursday and Sunday     zolpidem 10 MG tablet  Commonly known as:  AMBIEN  Take 5 mg by mouth at bedtime as needed. sleep        Past Medical History  Diagnosis Date  . Hypertension   . Coronary artery disease   . Legally blind   . Chronic kidney disease   . Anemia   . Hypothyroid   . Gastric ulcer   . Hiatal hernia   . Dyspepsia   . UTI  (lower urinary tract infection)   .  H/O: GI bleed 12//13  . Diastolic dysfunction   . Pacemaker   . High cholesterol   . CHF (congestive heart failure)   . Anginal pain   . MI (myocardial infarction) ? 1994  . DVT of upper extremity (deep vein thrombosis) 06/13/2012    BUE  . Seasonal allergies   . Allergy to perfume     "strong perfumes only" (06/13/2012)  . Shortness of breath     "can happen at any time" (06/13/2012)  . Type II diabetes mellitus   . History of blood transfusion 2013    "not related to OR; BP had dropped" (06/13/2012)  . GERD (gastroesophageal reflux disease)   . Daily headache   . Arthritis     "all over" (06/13/2012)  . A-fib   . 2Nd degree atrioventricular block     Past Surgical History  Procedure Laterality Date  . Esophagogastroduodenoscopy  12/22/2011    Procedure: ESOPHAGOGASTRODUODENOSCOPY (EGD);  Surgeon: Iva Boop, MD;  Location: [facility];  Service: Endoscopy;  Laterality: N/A;  . Cardioversion  06/06/06    successful  . Insert / replace / remove pacemaker  06/29/2006    Medtronic adapta  . Cardiac catheterization  10/25/92  . Cardiac catheterization  11/18/03    w/grafts 100%CX LAD 80 & 100%  . Cardiac catheterization  01/24/05    diffuse disease of native vessels  . Cardiac catheterization  06/06/06    severe native CAD  . Coronary artery bypass graft  10/27/92    LIMA to LAD,SVG to LAD second diagonal,obtuse maraginal of the CX and posterior descendingbranch of the RCA  . Cataract extraction w/ intraocular lens  implant, bilateral Bilateral   . Refractive surgery Bilateral     Family History  Problem Relation Age of Onset  . Breast cancer Daughter   . Diabetes Son   . Diabetes Daughter   . Heart disease Father   . Diabetes Father   . Colon polyps Daughter   . Heart attack Brother   . Diabetes Brother   . Stroke Sister     Social History:  reports that she has never smoked. She has never used smokeless tobacco. She reports that  she does not drink alcohol or use illicit drugs.  Allergies:  Allergies  Allergen Reactions  . Darvon     Unknown reaction   . Digoxin And Related Nausea Only  . Penicillins Other (See Comments)    Was told had allergy from childhood... Unknown reaction  . Percocet [Oxycodone-Acetaminophen] Other (See Comments)    confused  . Percodan [Oxycodone-Aspirin]   . Vicodin [Hydrocodone-Acetaminophen] Other (See Comments)    confused    REVIEW of systems:  Hypothyroidism: She is taking only 50 mcg daily with stable TSH, needs followup level   Lab Results  Component Value Date   TSH 1.64 01/24/2013    She has had renal dysfunction which is long-standing  Lab Results  Component Value Date   CREATININE 1.1 06/24/2013    Has had periodic pedal edema. Does wear elastic stockings  She has significant neuropathy with sensory loss as on her recent foot exam.  Some tingling in fingers and was told to take extra gabapentin as needed  Diabetic foot exam was done on 10/18/12   Examination:   BP 128/60  Pulse 75  Temp(Src) 98.2 F (36.8 C)  Resp 14  SpO2 93%  Cannot calculate BMI with a height equal to zero.    Assesment/plan:   Diabetes type  2, long-standing and previously on insulin.   Againher blood sugars are looking fairly good considering her age and multiple medical problems She is taking her low-dose glipizide  only when blood sugars are recently high and probably does not need it Discussed with her and her family that she can be more liberal with her diet considering her age She needs to be concerned only if blood sugars are persistently high and stay over 200   will have her keep some glipizide handy in case it needs to be restarted  2. Hypothyroidism: She has been on a lower dose than usual for some time and TSH  will be reassessed  today   3. CKD: Creatinine  to be checked    Elayne Snare 06/24/2013, 11:22 AM   Labs  pending  No visits with results within 1 Day(s)  from this visit. Latest known visit with results is:  Anti-coag visit on 06/13/2013  Component Date Value Ref Range Status  . INR 06/13/2013 2.5   Final

## 2013-06-30 ENCOUNTER — Ambulatory Visit (INDEPENDENT_AMBULATORY_CARE_PROVIDER_SITE_OTHER): Payer: Medicare Other | Admitting: Gastroenterology

## 2013-06-30 ENCOUNTER — Encounter: Payer: Self-pay | Admitting: Gastroenterology

## 2013-06-30 VITALS — BP 120/70 | HR 68

## 2013-06-30 DIAGNOSIS — R1032 Left lower quadrant pain: Secondary | ICD-10-CM | POA: Insufficient documentation

## 2013-06-30 MED ORDER — HYOSCYAMINE SULFATE ER 0.375 MG PO TBCR: EXTENDED_RELEASE_TABLET | ORAL | Status: DC

## 2013-06-30 NOTE — Progress Notes (Signed)
_                                                                                                                History of Present Illness: A 80 year old female here for evaluation of abdominal pain.  He was last seen in March, 2015 for limited rectal bleeding that was felt secondary to hemorrhoids.  She moves her bowels about twice a week with the help of laxatives.  Most daily she complains of very nonspecific pain that starts in the lower abdomen and may radiate superiorly.  It is not necessarily relieved by bowel movement.  She denies nausea vomiting.  She has occasional pyrosis.  CT scan in November, 2014 was unremarkable.    Past Medical History  Diagnosis Date  . Hypertension   . Coronary artery disease   . Legally blind   . Chronic kidney disease   . Anemia   . Hypothyroid   . Gastric ulcer   . Hiatal hernia   . Dyspepsia   . UTI (lower urinary tract infection)   . H/O: GI bleed 12//13  . Diastolic dysfunction   . Pacemaker   . High cholesterol   . CHF (congestive heart failure)   . Anginal pain   . MI (myocardial infarction) ? 1994  . DVT of upper extremity (deep vein thrombosis) 06/13/2012    BUE  . Seasonal allergies   . Allergy to perfume     "strong perfumes only" (06/13/2012)  . Shortness of breath     "can happen at any time" (06/13/2012)  . Type II diabetes mellitus   . History of blood transfusion 2013    "not related to OR; BP had dropped" (06/13/2012)  . GERD (gastroesophageal reflux disease)   . Daily headache   . Arthritis     "all over" (06/13/2012)  . A-fib   . 2Nd degree atrioventricular block    Past Surgical History  Procedure Laterality Date  . Esophagogastroduodenoscopy  12/22/2011    Procedure: ESOPHAGOGASTRODUODENOSCOPY (EGD);  Surgeon: Gatha Mayer, MD;  Location: [facility];  Service: Endoscopy;  Laterality: N/A;  . Cardioversion  06/06/06    successful  . Insert / replace / remove pacemaker  06/29/2006    Medtronic  adapta  . Cardiac catheterization  10/25/92  . Cardiac catheterization  11/18/03    w/grafts 100%CX LAD 80 & 100%  . Cardiac catheterization  01/24/05    diffuse disease of native vessels  . Cardiac catheterization  06/06/06    severe native CAD  . Coronary artery bypass graft  10/27/92    LIMA to LAD,SVG to LAD second diagonal,obtuse maraginal of the CX and posterior descendingbranch of the RCA  . Cataract extraction w/ intraocular lens  implant, bilateral Bilateral   . Refractive surgery Bilateral    family history includes Breast cancer in her daughter; Colon polyps in her daughter; Diabetes in her brother, daughter, father, and son; Heart attack in her brother; Heart disease in her father; Stroke in her sister.  Current Outpatient Prescriptions  Medication Sig Dispense Refill  . ACCU-CHEK FASTCLIX LANCETS MISC 2 (two) times daily.      Marland Kitchen acetaminophen (TYLENOL) 500 MG tablet Take 500-1,000 mg by mouth 2 (two) times daily as needed for mild pain.      Marland Kitchen acetaminophen-codeine (TYLENOL #3) 300-30 MG per tablet Take 1 tablet by mouth 2 (two) times daily as needed for pain.      Marland Kitchen aspirin 81 MG chewable tablet Chew 81 mg by mouth daily.      . Blood Glucose Monitoring Suppl (ACCU-CHEK AVIVA PLUS) W/DEVICE KIT 1 each by Does not apply route as needed.  1 kit  0  . feeding supplement, GLUCERNA SHAKE, (GLUCERNA SHAKE) LIQD Take 237 mLs by mouth 2 (two) times daily between meals.      . folic acid (FOLVITE) 1 MG tablet Take 1 tablet (1 mg total) by mouth daily.  90 tablet  0  . furosemide (LASIX) 40 MG tablet Take 1 tablet daily x 3 days then as directed  30 tablet  0  . gabapentin (NEURONTIN) 100 MG capsule Take 100 mg by mouth 2 (two) times daily.       Marland Kitchen glucose blood (ACCU-CHEK AVIVA PLUS) test strip Use to check blood sugars 2 times daily dx code 250.00  200 each  3  . HYDROcodone-acetaminophen (NORCO/VICODIN) 5-325 MG per tablet Take 1 tablet by mouth 2 (two) times daily.      . hydrocortisone  (ANUSOL-HC) 25 MG suppository Use 1 suppository rectally at bedtime for 7 days.  7 suppository  1  . isosorbide mononitrate (IMDUR) 30 MG 24 hr tablet Take 15 mg by mouth daily.      . Lancets Misc. (ACCU-CHEK FASTCLIX LANCET) KIT Use as directec  1 kit  1  . levothyroxine (SYNTHROID, LEVOTHROID) 50 MCG tablet Take 50 mcg by mouth daily before breakfast.      . loratadine (CLARITIN) 10 MG tablet Take 10 mg by mouth daily.      . metoprolol tartrate (LOPRESSOR) 25 MG tablet Take 12.5 mg by mouth 2 (two) times daily.      . mupirocin ointment (BACTROBAN) 2 %       . neomycin-bacitracin-polymyxin (NEOSPORIN) [phone] ointment Place 1 application into the left ear as needed (for inflammation behind the ear).      . nitroGLYCERIN (NITRODUR - DOSED IN MG/24 HR) 0.2 mg/hr patch Place 1 patch onto the skin daily as needed. Pt does not wear all the time      . omeprazole (PRILOSEC) 20 MG capsule Take 1 capsule (20 mg total) by mouth daily.  90 capsule  3  . PATADAY 0.2 % SOLN       . potassium chloride SA (K-DUR,KLOR-CON) 20 MEQ tablet Take 1 tablet daily x 3 days with furosemide then as directed  30 tablet  0  . rosuvastatin (CRESTOR) 10 MG tablet Take 10 mg by mouth at bedtime.       . Thymol CRYS Apply 1 application topically 2 (two) times daily. Used for nails      . tobramycin (TOBREX) 0.3 % ophthalmic solution Place 1 drop into both eyes daily as needed.       . Vitamin D, Ergocalciferol, (DRISDOL) 50000 UNITS CAPS capsule Take 50,000 Units by mouth daily.       Marland Kitchen warfarin (COUMADIN) 2.5 MG tablet Take 1 1/2 tablet Monday Wednesday Friday and Saturday and 1 tablet on Tuesday Thursday and Sunday      .  zolpidem (AMBIEN) 10 MG tablet Take 5 mg by mouth at bedtime as needed. sleep       No current facility-administered medications for this visit.   Allergies as of 06/30/2013 - Review Complete 06/30/2013  Allergen Reaction Noted  . Darvon  03/14/2011  . Digoxin and related Nausea Only 04/22/2012    . Penicillins Other (See Comments) 12/17/2011  . Percocet [oxycodone-acetaminophen] Other (See Comments) 03/14/2011  . Percodan [oxycodone-aspirin]  09/12/2011  . Vicodin [hydrocodone-acetaminophen] Other (See Comments) 12/18/2011    reports that she has never smoked. She has never used smokeless tobacco. She reports that she does not drink alcohol or use illicit drugs.     Review of Systems: Pertinent positive and negative review of systems were noted in the above HPI section. All other review of systems were otherwise negative.  Vital signs were reviewed in today's medical record Physical Exam: General: Elderly female examined while sitting in a wheelchair Skin: anicteric Head: Normocephalic and atraumatic Eyes:  sclerae anicteric, EOMI Ears: Normal auditory acuity Mouth: No deformity or lesions Neck: Supple, no masses or thyromegaly Lungs: Clear throughout to auscultation Heart: Regular rate and rhythm; no murmurs, rubs or bruits Abdomen: Soft, non tender and non distended. No masses, hepatosplenomegaly or hernias noted. Normal Bowel sounds Rectal:deferred Musculoskeletal: Symmetrical with no gross deformities  Skin: No lesions on visible extremities Pulses:  Normal pulses noted Extremities: No clubbing, cyanosis,  or deformities noted.  There is 2-3+ pedal edema Neurological: Alert oriented x 4, grossly nonfocal Cervical Nodes:  No significant cervical adenopathy Inguinal Nodes: No significant inguinal adenopathy Psychological:  Alert and cooperative. Normal mood and affect  See Assessment and Plan under Problem List

## 2013-06-30 NOTE — Patient Instructions (Signed)
Take Miralax daily use in water or juice coffee or tea  We are sending in your script to your pharmacy Call back in one week to report your progress

## 2013-06-30 NOTE — Assessment & Plan Note (Signed)
Patient has very nonspecific symptoms of lower abdominal pain with radiation  cephalad.  It may be do to chronic constipation.  There is no obvious intra-abdominal pathology by exam.  Recommendations #1 trial of daily MiraLax #2 hyomax when necessary  The patient was instructed to contact me in approximately one week if symptoms are not improved at which point I would consider blood work and possibly a repeat CT

## 2013-07-02 ENCOUNTER — Telehealth: Payer: Self-pay | Admitting: Cardiovascular Disease

## 2013-07-02 NOTE — Telephone Encounter (Signed)
Calling to see if she is suppose to be taking furosemide and K+ on a daily basis.  Told her to take on a prn basis at her last visit with Brittainy for increased edema.  Voiced understanding.

## 2013-07-02 NOTE — Telephone Encounter (Signed)
Please call,concerning her fluid medicine,

## 2013-07-04 ENCOUNTER — Ambulatory Visit (INDEPENDENT_AMBULATORY_CARE_PROVIDER_SITE_OTHER): Payer: Medicare Other | Admitting: Pharmacist Clinician (PhC)/ Clinical Pharmacy Specialist

## 2013-07-04 ENCOUNTER — Telehealth: Payer: Self-pay | Admitting: Gastroenterology

## 2013-07-04 DIAGNOSIS — I82409 Acute embolism and thrombosis of unspecified deep veins of unspecified lower extremity: Secondary | ICD-10-CM

## 2013-07-04 DIAGNOSIS — Z7901 Long term (current) use of anticoagulants: Secondary | ICD-10-CM

## 2013-07-04 LAB — POCT INR: INR: 1.3

## 2013-07-04 NOTE — Telephone Encounter (Signed)
Called patient's son as per message left. Spoke with [phone]). He states the patient has tried Miralax daily and has not had a bowel movement in a week. Spoke with Alonza Bogus, PA and patient's son instructed to give her Magnesium Citrate today. He states this has worked in the past for her. Dr. Deatra Ina, wanted to let you know the trail of Miralax was a failure. Please, advise.

## 2013-07-04 NOTE — Telephone Encounter (Signed)
Left message for pt to call back  °

## 2013-07-06 NOTE — Telephone Encounter (Signed)
If daily miralax is not working she can try Linzess 145 ug qd.  Stop if she gets diarrhea

## 2013-07-07 ENCOUNTER — Telehealth: Payer: Self-pay | Admitting: Gastroenterology

## 2013-07-07 MED ORDER — LINACLOTIDE 145 MCG PO CAPS: 145.0000 ug | ORAL_CAPSULE | Freq: Every day | ORAL | Status: DC

## 2013-07-07 NOTE — Telephone Encounter (Signed)
Spoke with Nicole Kindred and gave him recommendation. They will try Linzess. Rx sent.

## 2013-07-07 NOTE — Telephone Encounter (Signed)
Magnesium citrate one half to one bottle daily as needed

## 2013-07-07 NOTE — Telephone Encounter (Signed)
Dr Deatra Ina, Please advise

## 2013-07-09 NOTE — Telephone Encounter (Signed)
Tried to call was put on hold  Will call back tomorrow

## 2013-07-11 NOTE — Telephone Encounter (Signed)
Spoke with patient about using the [place]

## 2013-07-16 ENCOUNTER — Other Ambulatory Visit: Payer: Self-pay | Admitting: *Deleted

## 2013-07-16 ENCOUNTER — Telehealth: Payer: Self-pay | Admitting: Pharmacist Clinician (PhC)/ Clinical Pharmacy Specialist

## 2013-07-16 MED ORDER — GLIPIZIDE 2.5 MG HALF TABLET: ORAL_TABLET | ORAL | Status: DC

## 2013-07-17 ENCOUNTER — Ambulatory Visit: Payer: Medicare Other | Admitting: Pharmacist Clinician (PhC)/ Clinical Pharmacy Specialist

## 2013-07-21 ENCOUNTER — Other Ambulatory Visit: Payer: Self-pay | Admitting: *Deleted

## 2013-07-21 ENCOUNTER — Other Ambulatory Visit: Payer: Self-pay | Admitting: Cardiovascular Disease

## 2013-07-21 ENCOUNTER — Ambulatory Visit (INDEPENDENT_AMBULATORY_CARE_PROVIDER_SITE_OTHER): Payer: Medicare Other | Admitting: Pharmacist

## 2013-07-21 DIAGNOSIS — I82409 Acute embolism and thrombosis of unspecified deep veins of unspecified lower extremity: Secondary | ICD-10-CM

## 2013-07-21 DIAGNOSIS — Z7901 Long term (current) use of anticoagulants: Secondary | ICD-10-CM

## 2013-07-21 LAB — POCT INR: INR: 1.6

## 2013-07-21 MED ORDER — GLIPIZIDE 2.5 MG HALF TABLET: ORAL_TABLET | ORAL | Status: DC

## 2013-07-21 NOTE — Telephone Encounter (Signed)
Closed encounter °

## 2013-07-22 NOTE — Telephone Encounter (Signed)
Rx refill sent to patient pharmacy   

## 2013-07-23 ENCOUNTER — Telehealth: Payer: Self-pay | Admitting: Endocrinology

## 2013-07-23 NOTE — Telephone Encounter (Signed)
Ref # [id number]

## 2013-07-23 NOTE — Telephone Encounter (Signed)
Pharm needs glucotrol clarification on rx please call Natasha Mead

## 2013-07-24 ENCOUNTER — Telehealth: Payer: Self-pay | Admitting: Pharmacist Clinician (PhC)/ Clinical Pharmacy Specialist

## 2013-07-24 NOTE — Telephone Encounter (Signed)
Returned call to patient's wife no answer.LMTC. 

## 2013-07-24 NOTE — Telephone Encounter (Signed)
Has questions regarding the fluid pill that Erasmo Downer gave Mrs. Mongiello.

## 2013-07-28 ENCOUNTER — Telehealth: Payer: Self-pay | Admitting: Cardiovascular Disease

## 2013-07-28 NOTE — Telephone Encounter (Signed)
Per daughter [name] they spoke with her PCP last week who told her to take Lasix daily as needed.  States her swelling has gone done a lot and will give her the Lasix as needed. Confirmed appt Monday w/Kristin.

## 2013-07-28 NOTE — Telephone Encounter (Signed)
Amber w/[employer] calling for [name] most recent EF -she follows her biometrically with scales and a tablet. Info given  EF checked with [facility] 40-45% 02/2013.

## 2013-07-28 NOTE — Telephone Encounter (Signed)
LM on daughter's cell to return call.

## 2013-07-30 ENCOUNTER — Telehealth: Payer: Self-pay | Admitting: Endocrinology

## 2013-07-30 ENCOUNTER — Other Ambulatory Visit: Payer: Self-pay | Admitting: *Deleted

## 2013-07-30 MED ORDER — ACCU-CHEK MULTICLIX LANCETS MISC: Status: DC

## 2013-07-30 MED ORDER — ACCU-CHEK AVIVA PLUS W/DEVICE KIT: PACK | Status: DC

## 2013-07-30 NOTE — Telephone Encounter (Signed)
rx is on your desk waiting for signature

## 2013-07-30 NOTE — Telephone Encounter (Signed)
Pt calling to speak with Suanne Marker regarding message below

## 2013-07-30 NOTE — Telephone Encounter (Signed)
Patient stated that [facility], [facility] called and need Dr Dwyane Dee to fax meter and Accu Check Maltic Lix, Prescription  to pharmacy.  Thank you   [name] daughter [phone]

## 2013-08-04 ENCOUNTER — Ambulatory Visit (INDEPENDENT_AMBULATORY_CARE_PROVIDER_SITE_OTHER): Payer: Medicare Other | Admitting: Pharmacist Clinician (PhC)/ Clinical Pharmacy Specialist

## 2013-08-04 DIAGNOSIS — I82409 Acute embolism and thrombosis of unspecified deep veins of unspecified lower extremity: Secondary | ICD-10-CM

## 2013-08-04 DIAGNOSIS — Z7901 Long term (current) use of anticoagulants: Secondary | ICD-10-CM

## 2013-08-04 LAB — POCT INR: INR: 2.1

## 2013-08-12 ENCOUNTER — Telehealth: Payer: Self-pay | Admitting: Pharmacist Clinician (PhC)/ Clinical Pharmacy Specialist

## 2013-08-12 NOTE — Telephone Encounter (Signed)
dtr wanted to verify dose of warfarin.  3.75mg  qd x 2.5mg  Sunday.

## 2013-08-12 NOTE — Telephone Encounter (Signed)
Please call with the dosage of Coumadin for this patient.  Call between 10:00am and 11:00am if possible.

## 2013-08-13 ENCOUNTER — Other Ambulatory Visit: Payer: Self-pay | Admitting: Pharmacist Clinician (PhC)/ Clinical Pharmacy Specialist

## 2013-08-13 MED ORDER — WARFARIN SODIUM 2.5 MG PO TABS: ORAL_TABLET | ORAL | Status: DC

## 2013-08-19 ENCOUNTER — Telehealth: Payer: Self-pay | Admitting: Cardiovascular Disease

## 2013-08-19 NOTE — Telephone Encounter (Signed)
Please call,pain her left arm Pt says she has no other symptoms.[name]

## 2013-08-19 NOTE — Telephone Encounter (Signed)
Pt.s daughter stated pain in lt. Arm had gotten better and she had an appt. Here in the morning at 8:30

## 2013-08-20 ENCOUNTER — Ambulatory Visit (INDEPENDENT_AMBULATORY_CARE_PROVIDER_SITE_OTHER): Payer: Medicare Other | Admitting: Cardiology

## 2013-08-20 ENCOUNTER — Encounter: Payer: Self-pay | Admitting: Cardiology

## 2013-08-20 VITALS — BP 139/76 | HR 62 | Ht 64.0 in | Wt 201.0 lb

## 2013-08-20 DIAGNOSIS — Z7901 Long term (current) use of anticoagulants: Secondary | ICD-10-CM

## 2013-08-20 DIAGNOSIS — E119 Type 2 diabetes mellitus without complications: Secondary | ICD-10-CM

## 2013-08-20 DIAGNOSIS — M79602 Pain in left arm: Secondary | ICD-10-CM

## 2013-08-20 DIAGNOSIS — M79609 Pain in unspecified limb: Secondary | ICD-10-CM

## 2013-08-20 DIAGNOSIS — I209 Angina pectoris, unspecified: Secondary | ICD-10-CM

## 2013-08-20 DIAGNOSIS — I10 Essential (primary) hypertension: Secondary | ICD-10-CM

## 2013-08-20 DIAGNOSIS — Z79899 Other long term (current) drug therapy: Secondary | ICD-10-CM

## 2013-08-20 DIAGNOSIS — I2510 Atherosclerotic heart disease of native coronary artery without angina pectoris: Secondary | ICD-10-CM

## 2013-08-20 DIAGNOSIS — R079 Chest pain, unspecified: Secondary | ICD-10-CM

## 2013-08-20 DIAGNOSIS — I25119 Atherosclerotic heart disease of native coronary artery with unspecified angina pectoris: Secondary | ICD-10-CM

## 2013-08-20 LAB — COMPREHENSIVE METABOLIC PANEL
ALBUMIN: 3.7 g/dL (ref 3.5–5.2)
ALT: <8 U/L (ref 0–35)
AST: 15 U/L (ref 0–37)
Alkaline Phosphatase: 85 U/L (ref 39–117)
BILIRUBIN TOTAL: 0.5 mg/dL (ref 0.2–1.2)
BUN: 16 mg/dL (ref 6–23)
CO2: 28 meq/L (ref 19–32)
Calcium: 9 mg/dL (ref 8.4–10.5)
Chloride: 102 mEq/L (ref 96–112)
Creat: 1.23 mg/dL — ABNORMAL HIGH (ref 0.50–1.10)
GLUCOSE: 152 mg/dL — AB (ref 70–99)
Potassium: 5 mEq/L (ref 3.5–5.3)
Sodium: 137 mEq/L (ref 135–145)
Total Protein: 6.4 g/dL (ref 6.0–8.3)

## 2013-08-20 LAB — CBC
HCT: 38.4 % (ref 36.0–46.0)
Hemoglobin: 12.6 g/dL (ref 12.0–15.0)
MCH: 28.7 pg (ref 26.0–34.0)
MCHC: 32.8 g/dL (ref 30.0–36.0)
MCV: 87.5 fL (ref 78.0–100.0)
Platelets: 164 10*3/uL (ref 150–400)
RBC: 4.39 MIL/uL (ref 3.87–5.11)
RDW: 13.7 % (ref 11.5–15.5)
WBC: 4.9 10*3/uL (ref 4.0–10.5)

## 2013-08-20 LAB — TROPONIN I: Troponin I: 0.01 ng/mL (ref <0.06)

## 2013-08-20 MED ORDER — FUROSEMIDE 40 MG PO TABS: ORAL_TABLET | ORAL | Status: DC

## 2013-08-20 NOTE — Patient Instructions (Signed)
Your physician recommends that you schedule a follow-up appointment After stress test  Your physician recommends that you return for lab work in: Today  Your physician has requested that you have a [place]. For further information please visit [email or web address]. Please follow instruction sheet, as given.

## 2013-08-20 NOTE — Progress Notes (Signed)
08/24/2013   PCP: Kevan Ny, MD   Chief Complaint  Patient presents with  . Follow-up    pt c/o burning, hurting sensation starting from wrist all the way up to her shoulder, pain in stomach all the way up under her breast.    Primary Cardiologist:Dr. Bertrum Sol   HPI:  80 y.o. Widowed, female (her husband had been one of our pts as well) who is nonambulatory (uses a wheelchair) and has a history of coronary disease, previous bypass surgery, permanent pacemaker for high-grade AV block, paroxysmal atrial fibrillation, recent upper extremity deep venous thrombosis associated with a venous catheter and recurrent problems with severe edema due to gastrointestinal bleeding. She is on coumadin. She is followed by Dr. Sallyanne Kuster.   She presents today for an episode of chest pain last night the pain is actually in her left arm and maybe axillary area. I am burning in her wrist and into her left arm she had no diaphoresis no shortness of breath and no nausea.  She is unable to take sublingual nitroglycerin as it makes her pass out.  Her EKG today she's pacing has no recurrent chest pain today but I can recreate the pain with movement of her left arm.  She had been doing well otherwise until last evening.     Allergies  Allergen Reactions  . Darvon     Unknown reaction   . Digoxin And Related Nausea Only  . Penicillins Other (See Comments)    Was told had allergy from childhood... Unknown reaction  . Percocet [Oxycodone-Acetaminophen] Other (See Comments)    confused  . Percodan [Oxycodone-Aspirin]   . Vicodin [Hydrocodone-Acetaminophen] Other (See Comments)    confused    Current Outpatient Prescriptions  Medication Sig Dispense Refill  . acetaminophen (TYLENOL) 500 MG tablet Take 500-1,000 mg by mouth 2 (two) times daily as needed for mild pain.      Marland Kitchen acetaminophen-codeine (TYLENOL #3) 300-30 MG per tablet Take 1 tablet by mouth 2 (two) times daily as needed for pain.       Marland Kitchen aspirin 81 MG chewable tablet Chew 81 mg by mouth daily.      . Blood Glucose Monitoring Suppl (ACCU-CHEK AVIVA PLUS) W/DEVICE KIT Use to check blood sugar 2 times per day dx code 250.00  1 kit  0  . cholecalciferol (VITAMIN D) 1000 UNITS tablet Take 1,000 Units by mouth daily.      . feeding supplement, GLUCERNA SHAKE, (GLUCERNA SHAKE) LIQD Take 237 mLs by mouth 2 (two) times daily between meals.      . folic acid (FOLVITE) 1 MG tablet Take 1 tablet by mouth  daily  90 tablet  1  . furosemide (LASIX) 40 MG tablet Take 1 tablet daily x 3 days then as directed  30 tablet  0  . furosemide (LASIX) 40 MG tablet Take 1 tablet daily x 3 days then as directed  30 tablet  6  . gabapentin (NEURONTIN) 100 MG capsule Take 100 mg by mouth 2 (two) times daily.       Marland Kitchen glipiZIDE (GLUCOTROL) 2.5 mg TABS tablet Take 1 tablet only if blood sugar is over 200  90 tablet  0  . glucose blood (ACCU-CHEK AVIVA PLUS) test strip Use to check blood sugars 2 times daily dx code 250.00  200 each  3  . hydrocortisone (ANUSOL-HC) 25 MG suppository Use 1 suppository rectally at bedtime for 7 days.  7  suppository  1  . Hyoscyamine Sulfate 0.375 MG TBCR A1 tab twice a day as needed, abdominal pain  25 tablet  1  . isosorbide mononitrate (IMDUR) 30 MG 24 hr tablet Take 15 mg by mouth daily.      . Lancets (ACCU-CHEK MULTICLIX) lancets Use as instructed to check blood sugar 2 times per day dx code 250.00  200 each  1  . Lancets Misc. (ACCU-CHEK FASTCLIX LANCET) KIT Use as directec  1 kit  1  . levothyroxine (SYNTHROID, LEVOTHROID) 50 MCG tablet Take 50 mcg by mouth daily before breakfast.      . Linaclotide (LINZESS) 145 MCG CAPS capsule Take 1 capsule (145 mcg total) by mouth daily.  30 capsule  3  . loratadine (CLARITIN) 10 MG tablet Take 10 mg by mouth daily.      . metoprolol tartrate (LOPRESSOR) 25 MG tablet Take 12.5 mg by mouth 2 (two) times daily.      . mupirocin ointment (BACTROBAN) 2 %       .  neomycin-bacitracin-polymyxin (NEOSPORIN) [phone] ointment Place 1 application into the left ear as needed (for inflammation behind the ear).      . nitroGLYCERIN (NITRODUR - DOSED IN MG/24 HR) 0.2 mg/hr patch Place 1 patch onto the skin daily as needed. Pt does not wear all the time      . omeprazole (PRILOSEC) 20 MG capsule Take 1 capsule (20 mg total) by mouth daily.  90 capsule  3  . PATADAY 0.2 % SOLN Place 1 drop into both eyes as needed.       . potassium chloride SA (K-DUR,KLOR-CON) 20 MEQ tablet Take 1 tablet daily x 3 days with furosemide then as directed  30 tablet  0  . rosuvastatin (CRESTOR) 10 MG tablet Take 10 mg by mouth at bedtime.       . Thymol CRYS Apply 1 application topically 2 (two) times daily. Used for nails      . tobramycin (TOBREX) 0.3 % ophthalmic solution Place 1 drop into both eyes daily as needed.       . Vitamin D, Ergocalciferol, (DRISDOL) 50000 UNITS CAPS capsule Take 50,000 Units by mouth daily.       Marland Kitchen zolpidem (AMBIEN) 10 MG tablet Take 5 mg by mouth at bedtime as needed. sleep      . warfarin (COUMADIN) 2.5 MG tablet Take 1 to 1.5 tablets by mouth daily as directed by coumadin clinic  40 tablet  2   No current facility-administered medications for this visit.    Past Medical History  Diagnosis Date  . Hypertension   . Coronary artery disease   . Legally blind   . Chronic kidney disease   . Anemia   . Hypothyroid   . Gastric ulcer   . Hiatal hernia   . Dyspepsia   . UTI (lower urinary tract infection)   . H/O: GI bleed 12//13  . Diastolic dysfunction   . Pacemaker   . High cholesterol   . CHF (congestive heart failure)   . Anginal pain   . MI (myocardial infarction) ? 1994  . DVT of upper extremity (deep vein thrombosis) 06/13/2012    BUE  . Seasonal allergies   . Allergy to perfume     "strong perfumes only" (06/13/2012)  . Shortness of breath     "can happen at any time" (06/13/2012)  . Type II diabetes mellitus   . History of blood  transfusion 2013    "  not related to OR; BP had dropped" (06/13/2012)  . GERD (gastroesophageal reflux disease)   . Daily headache   . Arthritis     "all over" (06/13/2012)  . A-fib   . 2Nd degree atrioventricular block     Past Surgical History  Procedure Laterality Date  . Esophagogastroduodenoscopy  12/22/2011    Procedure: ESOPHAGOGASTRODUODENOSCOPY (EGD);  Surgeon: Gatha Mayer, MD;  Location: [facility];  Service: Endoscopy;  Laterality: N/A;  . Cardioversion  06/06/06    successful  . Insert / replace / remove pacemaker  06/29/2006    Medtronic adapta  . Cardiac catheterization  10/25/92  . Cardiac catheterization  11/18/03    w/grafts 100%CX LAD 80 & 100%  . Cardiac catheterization  01/24/05    diffuse disease of native vessels  . Cardiac catheterization  06/06/06    severe native CAD  . Coronary artery bypass graft  10/27/92    LIMA to LAD,SVG to LAD second diagonal,obtuse maraginal of the CX and posterior descendingbranch of the RCA  . Cataract extraction w/ intraocular lens  implant, bilateral Bilateral   . Refractive surgery Bilateral     HUT:MLYYTKP:TW colds or fevers, mild weight changes Skin:no rashes or ulcers HEENT:no blurred vision, no congestion CV:see HPI PUL:see HPI GI:no diarrhea constipation or melena, no indigestion GU:no hematuria, no dysuria MS:+ lt shoulder pain, no claudication Neuro:no syncope, no lightheadedness Endo:+ diabetes but with less appetite her meds have been decreased, + thyroid disease- stable  Wt Readings from Last 3 Encounters:  08/20/13 201 lb (91.173 kg)  04/01/13 192 lb (87.091 kg)  02/20/13 197 lb (89.359 kg)    PHYSICAL EXAM BP 139/76  Pulse 62  Ht $R'5\' 4"'Xq$  (1.626 m)  Wt 201 lb (91.173 kg)  BMI 34.48 kg/m2 General:Pleasant affect, NAD Skin:Warm and dry, brisk capillary refill HEENT:normocephalic, sclera clear, mucus membranes moist Neck:supple, no JVD, no bruits  Heart:S1S2 RRR without murmur, gallup, rub or  click Lungs:clear without rales, rhonchi, or wheezes SFK:CLEX, non tender, + BS, do not palpate liver spleen or masses Ext:+ lower ext edema- in feet only, 2+ pedal pulses, 2+ radial pulses, + lt shoulder pain to palpation and with movement in raising arm and moving arm across chest.  Neuro:alert and oriented, MAE, follows commands, + facial symmetry  EKG:a sensing and V. Pacing. No changes  ASSESSMENT AND PLAN Arm pain, left I can recreate her pain from last night with movement of the left arm, this may be bursitis I've asked him to use her Tylenol No. 3 for pain but as the pain did go into heart chest will do a stress test to further evaluate.  Coronary artery disease, CABG in 1994 with last cath 2008 patent grafts and normal EF Will evaluate lexiscan Myoview for ischemia as patient does have bypass grafts last cath in 2008.  I have increased her Imdur to 30 mg daily  Long term (current) use of anticoagulants Stable INR  Type II or unspecified type diabetes mellitus without mention of complication, not stated as uncontrolled Stable  Hypertension Controlled

## 2013-08-21 ENCOUNTER — Telehealth: Payer: Self-pay | Admitting: Cardiovascular Disease

## 2013-08-21 ENCOUNTER — Telehealth (HOSPITAL_COMMUNITY): Payer: Self-pay

## 2013-08-21 NOTE — Telephone Encounter (Signed)
Encounter complete. 

## 2013-08-21 NOTE — Telephone Encounter (Signed)
Pt need her Warfarin ?mg until her mail order comes in.Please call this in today.

## 2013-08-22 ENCOUNTER — Other Ambulatory Visit: Payer: Self-pay | Admitting: *Deleted

## 2013-08-22 ENCOUNTER — Encounter (HOSPITAL_COMMUNITY): Payer: Medicare Other

## 2013-08-22 ENCOUNTER — Telehealth (HOSPITAL_COMMUNITY): Payer: Self-pay

## 2013-08-22 MED ORDER — WARFARIN SODIUM 2.5 MG PO TABS: ORAL_TABLET | ORAL | Status: DC

## 2013-08-22 NOTE — Telephone Encounter (Signed)
Encounter complete. 

## 2013-08-24 DIAGNOSIS — M79602 Pain in left arm: Secondary | ICD-10-CM | POA: Insufficient documentation

## 2013-08-24 NOTE — Assessment & Plan Note (Signed)
Stable

## 2013-08-24 NOTE — Assessment & Plan Note (Signed)
I can recreate her pain from last night with movement of the left arm, this may be bursitis I've asked him to use her Tylenol No. 3 for pain but as the pain did go into heart chest will do a stress test to further evaluate.

## 2013-08-24 NOTE — Assessment & Plan Note (Signed)
Will evaluate lexiscan Myoview for ischemia as patient does have bypass grafts last cath in 2008.  I have increased her Imdur to 30 mg daily

## 2013-08-24 NOTE — Assessment & Plan Note (Signed)
Controlled.  

## 2013-08-24 NOTE — Assessment & Plan Note (Signed)
Stable INR

## 2013-08-26 ENCOUNTER — Ambulatory Visit: Payer: Medicare Other | Admitting: Pharmacist Clinician (PhC)/ Clinical Pharmacy Specialist

## 2013-08-26 ENCOUNTER — Encounter: Payer: Medicare Other | Admitting: Cardiovascular Disease

## 2013-08-27 ENCOUNTER — Encounter (HOSPITAL_COMMUNITY): Payer: Medicare Other

## 2013-08-27 ENCOUNTER — Telehealth (HOSPITAL_COMMUNITY): Payer: Self-pay

## 2013-08-27 ENCOUNTER — Telehealth: Payer: Self-pay | Admitting: Cardiology

## 2013-08-27 ENCOUNTER — Ambulatory Visit: Payer: Medicare Other | Admitting: Pharmacist Clinician (PhC)/ Clinical Pharmacy Specialist

## 2013-08-27 ENCOUNTER — Telehealth: Payer: Self-pay | Admitting: *Deleted

## 2013-08-27 NOTE — Telephone Encounter (Signed)
Encounter complete. 

## 2013-08-27 NOTE — Telephone Encounter (Signed)
Spoke with pharmacist. She cannot send out the furosemide prescription the way that [facility] sent it in. Will have Dr. Sallyanne Kuster clarify and send to pharmacy.

## 2013-08-27 NOTE — Telephone Encounter (Signed)
Pharmacy has questions regarding the dosage for the patient's Lasix.  When you return the call to [facility], please reference #[id number]

## 2013-08-27 NOTE — Telephone Encounter (Signed)
Informed [name] per Dr. Sallyanne Kuster to take the furosemide as directed even though the bottle will say take daily. [name] voiced her understanding of these orders.

## 2013-08-29 ENCOUNTER — Ambulatory Visit (INDEPENDENT_AMBULATORY_CARE_PROVIDER_SITE_OTHER): Payer: Medicare Other | Admitting: Pharmacist Clinician (PhC)/ Clinical Pharmacy Specialist

## 2013-08-29 ENCOUNTER — Ambulatory Visit (HOSPITAL_COMMUNITY)
Admission: RE | Admit: 2013-08-29 | Discharge: 2013-08-29 | Disposition: A | Discharge status: Home / Self Care | Payer: Medicare Other | Source: Ambulatory Visit | Attending: Cardiology | Admitting: Cardiology

## 2013-08-29 DIAGNOSIS — R5383 Other fatigue: Secondary | ICD-10-CM | POA: Diagnosis not present

## 2013-08-29 DIAGNOSIS — I509 Heart failure, unspecified: Secondary | ICD-10-CM | POA: Insufficient documentation

## 2013-08-29 DIAGNOSIS — Z951 Presence of aortocoronary bypass graft: Secondary | ICD-10-CM | POA: Insufficient documentation

## 2013-08-29 DIAGNOSIS — E669 Obesity, unspecified: Secondary | ICD-10-CM | POA: Diagnosis not present

## 2013-08-29 DIAGNOSIS — R0602 Shortness of breath: Secondary | ICD-10-CM | POA: Diagnosis not present

## 2013-08-29 DIAGNOSIS — I2510 Atherosclerotic heart disease of native coronary artery without angina pectoris: Secondary | ICD-10-CM | POA: Diagnosis present

## 2013-08-29 DIAGNOSIS — R079 Chest pain, unspecified: Secondary | ICD-10-CM | POA: Diagnosis not present

## 2013-08-29 DIAGNOSIS — E119 Type 2 diabetes mellitus without complications: Secondary | ICD-10-CM | POA: Insufficient documentation

## 2013-08-29 DIAGNOSIS — Z8249 Family history of ischemic heart disease and other diseases of the circulatory system: Secondary | ICD-10-CM | POA: Diagnosis not present

## 2013-08-29 DIAGNOSIS — I10 Essential (primary) hypertension: Secondary | ICD-10-CM | POA: Diagnosis not present

## 2013-08-29 DIAGNOSIS — R42 Dizziness and giddiness: Secondary | ICD-10-CM | POA: Diagnosis not present

## 2013-08-29 DIAGNOSIS — Z7901 Long term (current) use of anticoagulants: Secondary | ICD-10-CM

## 2013-08-29 DIAGNOSIS — R5381 Other malaise: Secondary | ICD-10-CM | POA: Diagnosis not present

## 2013-08-29 DIAGNOSIS — I82409 Acute embolism and thrombosis of unspecified deep veins of unspecified lower extremity: Secondary | ICD-10-CM

## 2013-08-29 LAB — POCT INR: INR: 2.4

## 2013-08-29 MED ORDER — TECHNETIUM TC 99M SESTAMIBI GENERIC - CARDIOLITE
10.3000 | Freq: Once | INTRAVENOUS | Status: AC | PRN
  Administered 2013-08-29: 10 via INTRAVENOUS

## 2013-08-29 MED ORDER — AMINOPHYLLINE 25 MG/ML IV SOLN
125.0000 mg | Freq: Once | INTRAVENOUS | Status: AC
  Administered 2013-08-29: 125 mg via INTRAVENOUS

## 2013-08-29 MED ORDER — REGADENOSON 0.4 MG/5ML IV SOLN
0.4000 mg | Freq: Once | INTRAVENOUS | Status: AC
  Administered 2013-08-29: 0.4 mg via INTRAVENOUS

## 2013-08-29 MED ORDER — TECHNETIUM TC 99M SESTAMIBI GENERIC - CARDIOLITE
29.9000 | Freq: Once | INTRAVENOUS | Status: AC | PRN
  Administered 2013-08-29: 29.9 via INTRAVENOUS

## 2013-08-29 NOTE — Procedures (Addendum)
[place] [address] [place] [address] [phone]  Cardiology Nuclear Med Study  [name] is a 80 y.o. female     MRN : [id number]     DOB: 03/14/1920  Procedure Date: 08/29/2013  Nuclear Med Background Indication for Stress Test:  Graft Patency History:  CAD;MI-1994;CABG-10/27/1992;pacer;PAF;CHF;Last NUC MPI on 01/05/2009-EF=56%;ECHO on 03/06/2013-EF=40-45% Cardiac Risk Factors: Family History - CAD, Hypertension, Lipids, NIDDM, Obesity, TIA and Acute PE;Dyastolic Dysfuntion  Symptoms:  Chest Pain, Dizziness, Fatigue and SOB   Nuclear Pre-Procedure Caffeine/Decaff Intake:  8:00pm NPO After: 6:00am   IV Site: R Forearm  IV 0.9% NS with Angio Cath:  22g  Chest Size (in):  n/a IV Started by: Rolene Course, RN  Height: 5\' 4"  (1.626 m)  Cup Size: B  BMI:  Body mass index is 34.48 kg/(m^2). Weight:  201 lb (91.173 kg)   Tech Comments:  n/a    Nuclear Med Study 1 or 2 day study: 1 day  Stress Test Type:  [place] Provider:  Sanda Klein, MD   Resting Radionuclide: Technetium 81m Sestamibi  Resting Radionuclide Dose: 10.3 mCi   Stress Radionuclide:  Technetium 45m Sestamibi  Stress Radionuclide Dose: 29.9 mCi           Stress Protocol Rest HR: 79 Stress HR: 81  Rest BP: 196/99 Stress BP: 140/99  Exercise Time (min): n/a METS: n/a   Predicted Max HR: 129 bpm % Max HR: 65.12 bpm Rate Pressure Product: 16464  Dose of Adenosine (mg):  n/a Dose of Lexiscan: 0.4 mg  Dose of Atropine (mg): n/a Dose of Dobutamine: n/a mcg/kg/min (at max HR)  Stress Test Technologist: Leane Para, CCT Nuclear Technologist: Imagene Riches, CNMT   Rest Procedure:  Myocardial perfusion imaging was performed at rest 45 minutes following the intravenous administration of Technetium 47m Sestamibi. Stress Procedure:  The patient received IV Lexiscan 0.4 mg over 15-seconds.  Technetium 8m Sestamibi  injected iv at 30-seconds.  Patent experienced SOB and Headache and 125 mg of Aminophylline IV was administered.  There were no significant changes with Lexiscan.  Quantitative spect images were obtained after a 45 minute delay.  Transient Ischemic Dilatation (Normal <1.22):  1.12  QGS EDV:  112 ml QGS ESV:  72 ml LV Ejection Fraction: 35%       Rest ECG: Sinus rhythm with ventricular pacing.  Stress ECG: Uninteretable due to baseline ventricular pacing  QPS Raw Data Images:  Acquisition technically good; LVE. Stress Images:  There is decreased uptake in the inferior wall and apex. Rest Images:  There is decreased uptake in the inferior wall and apex, slightly less prominent compared to stress images. Subtraction (SDS):  These findings are consistent with prior inferior and apical infarct and mild peri-infarct ischemia.  Impression Exercise Capacity:  Lexiscan with no exercise. BP Response:  Normal blood pressure response. Clinical Symptoms:  There is dyspnea. ECG Impression:  EKG uninterpretable due to ventricular pacing at rest and stress. Comparison with Prior Nuclear Study: LV function reduced compared to previous  Overall Impression:  High risk stress nuclear study with large, severe intensity, partially reversible, inferior and apical defect consistent with prior inferior apical infarct and mild peri-infarct ischemia towards the apex; perfusion similar to previous study; study high risk due to reduced LV function.  LV Wall Motion:  Inferior and apical akinesis; LV function worse compared to previous.   Kirk Ruths, MD  08/29/2013 1:13 PM

## 2013-09-01 ENCOUNTER — Telehealth: Payer: Self-pay | Admitting: Cardiology

## 2013-09-01 ENCOUNTER — Telehealth: Payer: Self-pay | Admitting: *Deleted

## 2013-09-01 NOTE — Telephone Encounter (Signed)
I talked to both [name] the daughter and Mrs Bushway about her chest pain and abnormal nuc study.  They will talk to son Jenny Reichmann to deceide which interventionalist they would lkie,  Dr. Claiborne Billings has cathed her before.  Then we will have her see which ever MD and appt will be made. Need to decide if truly a candidate for cath and PCI.   In the meantime increase her Imdur to 30 mg daily.  She cannot take SL NTG due to hypotension with SL.  They will call tomorrow and ask for [place].

## 2013-09-01 NOTE — Telephone Encounter (Signed)
Faxed medication clarification for patient's furosemide to [facility].

## 2013-09-02 NOTE — Telephone Encounter (Signed)
Son, Jenny Reichmann, called and requested [name] be seen by Dr. Claiborne Billings for eval for cath/PCI.  Info sent to scheduling to make an appt to discuss cath.

## 2013-09-05 ENCOUNTER — Telehealth: Payer: Self-pay | Admitting: Cardiovascular Disease

## 2013-09-05 NOTE — Telephone Encounter (Signed)
Discussed with dr c, dtr aware of the appt to see laura ingold on 09/15/13 to be set up for cath due to abnormal stress test. Explained if her discomfort does not ease after 30 min or so after taking the tylenol #3, the pt should go to the ER She needs to make sure she keeps her appt on 09-15-13. Patient dtr voiced understanding

## 2013-09-05 NOTE — Telephone Encounter (Signed)
Left message for pt dtr to call  

## 2013-09-05 NOTE — Telephone Encounter (Signed)
[name] called in stating that her mother has been complaining of chest pains and would like to be seen by Dr. Loletha Grayer today if possible. She did not state that the pains were active she just said that they are occuring more often that usual .Please call  Thanks

## 2013-09-05 NOTE — Telephone Encounter (Signed)
If you can not reach her on her cell phone,please call [phone].

## 2013-09-05 NOTE — Telephone Encounter (Signed)
Returning your call. °

## 2013-09-05 NOTE — Telephone Encounter (Signed)
Spoke with pt dtr, she reports the pt is having chest pain that is worse than her usual. She took a tylenol #3 yesterday and today and it has eased the pain some but has not completely gone away. The pain started in the chest and left arm, at present it is only in the left elbow and shoulder. She is c/o a little SOB. She would like to be seen today. Will discuss with dr c and call her back.

## 2013-09-11 ENCOUNTER — Telehealth: Payer: Self-pay | Admitting: Cardiology

## 2013-09-11 NOTE — Telephone Encounter (Signed)
Pt's daughter called in wanting to know what is going to take place when she comes in to see Cecilie Kicks on 08/31. Please call  Thanks

## 2013-09-11 NOTE — Telephone Encounter (Signed)
Returned call to patient's daughter [name] she stated she wanted to know why her mother has appointment with Cecilie Kicks NP 09/15/13.Advised she is to discuss results of recent myoview and possibly schedule cardiac cath.

## 2013-09-15 ENCOUNTER — Ambulatory Visit (INDEPENDENT_AMBULATORY_CARE_PROVIDER_SITE_OTHER): Payer: Medicare Other | Admitting: Pharmacist Clinician (PhC)/ Clinical Pharmacy Specialist

## 2013-09-15 ENCOUNTER — Encounter: Payer: Self-pay | Admitting: Cardiology

## 2013-09-15 ENCOUNTER — Ambulatory Visit (INDEPENDENT_AMBULATORY_CARE_PROVIDER_SITE_OTHER): Payer: Medicare Other | Admitting: Cardiology

## 2013-09-15 VITALS — BP 145/80 | HR 65 | Ht 64.0 in | Wt 203.5 lb

## 2013-09-15 DIAGNOSIS — Z7901 Long term (current) use of anticoagulants: Secondary | ICD-10-CM

## 2013-09-15 DIAGNOSIS — I209 Angina pectoris, unspecified: Secondary | ICD-10-CM

## 2013-09-15 DIAGNOSIS — I82409 Acute embolism and thrombosis of unspecified deep veins of unspecified lower extremity: Secondary | ICD-10-CM

## 2013-09-15 DIAGNOSIS — I2510 Atherosclerotic heart disease of native coronary artery without angina pectoris: Secondary | ICD-10-CM

## 2013-09-15 DIAGNOSIS — I25119 Atherosclerotic heart disease of native coronary artery with unspecified angina pectoris: Secondary | ICD-10-CM

## 2013-09-15 DIAGNOSIS — E119 Type 2 diabetes mellitus without complications: Secondary | ICD-10-CM

## 2013-09-15 DIAGNOSIS — R9439 Abnormal result of other cardiovascular function study: Secondary | ICD-10-CM

## 2013-09-15 LAB — POCT INR: INR: 3.9

## 2013-09-15 MED ORDER — METOPROLOL TARTRATE 25 MG PO TABS: 25.0000 mg | ORAL_TABLET | Freq: Two times a day (BID) | ORAL | Status: DC

## 2013-09-15 MED ORDER — NITROGLYCERIN 0.4 MG/HR TD PT24: 0.4000 mg | MEDICATED_PATCH | Freq: Every day | TRANSDERMAL | Status: DC

## 2013-09-15 NOTE — Progress Notes (Signed)
09/17/2013   PCP: Kevan Ny, MD   Chief Complaint  Patient presents with  . Follow-up    pt c/o heaviness in chest, and  SOB    Primary Cardiologist: Dr. Jerilynn Mages. Croitoru   HPI: 80 y.o. Widowed, female (her husband had been one of our pts as well) who is nonambulatory (uses a wheelchair) and has a history of coronary disease, previous bypass surgery, permanent pacemaker for high-grade AV block, paroxysmal atrial fibrillation, recent upper extremity deep venous thrombosis associated with a venous catheter and recurrent problems with severe edema due to gastrointestinal bleeding. She is on coumadin. She is followed by Dr. Sallyanne Kuster. She is back today for follow up to discuss abnormal stress test and decide upon cardiac cath.   She had a nuc study for an episode of chest pain night before the last visit, they called for the visit.  The pain was actually in her left arm and maybe axillary area. Also am burning in her wrist and into her left arm she had no diaphoresis no shortness of breath and no nausea. She is unable to take sublingual nitroglycerin as it makes her pass out.   Nuc study was noted to be: High risk stress nuclear study with large,  severe intensity, partially reversible, inferior and apical  defect consistent with prior inferior apical infarct and mild  peri-infarct ischemia towards the apex; perfusion similar to  previous study; study high risk due to reduced LV function.  LV Wall Motion: Inferior and apical akinesis; LV function worse  compared to previous.  Today she continues to complain of various pains, but some in her chest.  She also has poor memory and she does not always remember if she has pain.  Her son who is POA and her daughter are with her as well.    Prior to arrival they had discussed cardiac catheterization and all 3 prefer not to have cardiac catheterization but to try medical therapy if that is available.  Dr. Georgina Peer and I both agree with them  her creatinine is 1.23 with a CKD 3 bordering on 4.    Allergies  Allergen Reactions  . Darvon     Unknown reaction   . Digoxin And Related Nausea Only  . Penicillins Other (See Comments)    Was told had allergy from childhood... Unknown reaction  . Percocet [Oxycodone-Acetaminophen] Other (See Comments)    confused  . Percodan [Oxycodone-Aspirin]   . Vicodin [Hydrocodone-Acetaminophen] Other (See Comments)    confused    Current Outpatient Prescriptions  Medication Sig Dispense Refill  . acetaminophen (TYLENOL) 500 MG tablet Take 500-1,000 mg by mouth 2 (two) times daily as needed for mild pain.      Marland Kitchen acetaminophen-codeine (TYLENOL #3) 300-30 MG per tablet Take 1 tablet by mouth 2 (two) times daily as needed for pain.      Marland Kitchen aspirin 81 MG chewable tablet Chew 81 mg by mouth daily.      . Blood Glucose Monitoring Suppl (ACCU-CHEK AVIVA PLUS) W/DEVICE KIT Use to check blood sugar 2 times per day dx code 250.00  1 kit  0  . cholecalciferol (VITAMIN D) 1000 UNITS tablet Take 1,000 Units by mouth daily.      . feeding supplement, GLUCERNA SHAKE, (GLUCERNA SHAKE) LIQD Take 237 mLs by mouth 2 (two) times daily between meals.      . folic acid (FOLVITE) 1 MG tablet Take 1 tablet by mouth  daily  90 tablet  1  . furosemide (LASIX) 40 MG tablet Take 1 tablet daily x 3 days then as directed  30 tablet  0  . furosemide (LASIX) 40 MG tablet Take 1 tablet daily x 3 days then as directed  30 tablet  6  . gabapentin (NEURONTIN) 100 MG capsule Take 100 mg by mouth 2 (two) times daily.       Marland Kitchen glipiZIDE (GLUCOTROL) 2.5 mg TABS tablet Take 1 tablet only if blood sugar is over 200  90 tablet  0  . glucose blood (ACCU-CHEK AVIVA PLUS) test strip Use to check blood sugars 2 times daily dx code 250.00  200 each  3  . hydrocortisone (ANUSOL-HC) 25 MG suppository Use 1 suppository rectally at bedtime for 7 days.  7 suppository  1  . Hyoscyamine Sulfate 0.375 MG TBCR A1 tab twice a day as needed, abdominal  pain  25 tablet  1  . Lancets (ACCU-CHEK MULTICLIX) lancets Use as instructed to check blood sugar 2 times per day dx code 250.00  200 each  1  . Lancets Misc. (ACCU-CHEK FASTCLIX LANCET) KIT Use as directec  1 kit  1  . levothyroxine (SYNTHROID, LEVOTHROID) 50 MCG tablet Take 50 mcg by mouth daily before breakfast.      . Linaclotide (LINZESS) 145 MCG CAPS capsule Take 1 capsule (145 mcg total) by mouth daily.  30 capsule  3  . loratadine (CLARITIN) 10 MG tablet Take 10 mg by mouth daily.      . metoprolol tartrate (LOPRESSOR) 25 MG tablet Take 1 tablet (25 mg total) by mouth 2 (two) times daily.  60 tablet  6  . mupirocin ointment (BACTROBAN) 2 %       . neomycin-bacitracin-polymyxin (NEOSPORIN) [phone] ointment Place 1 application into the left ear as needed (for inflammation behind the ear).      Marland Kitchen omeprazole (PRILOSEC) 20 MG capsule Take 1 capsule (20 mg total) by mouth daily.  90 capsule  3  . PATADAY 0.2 % SOLN Place 1 drop into both eyes as needed.       . potassium chloride SA (K-DUR,KLOR-CON) 20 MEQ tablet Take 1 tablet daily x 3 days with furosemide then as directed  30 tablet  0  . rosuvastatin (CRESTOR) 10 MG tablet Take 10 mg by mouth at bedtime.       . Thymol CRYS Apply 1 application topically 2 (two) times daily. Used for nails      . tobramycin (TOBREX) 0.3 % ophthalmic solution Place 1 drop into both eyes daily as needed.       . Vitamin D, Ergocalciferol, (DRISDOL) 50000 UNITS CAPS capsule Take 50,000 Units by mouth daily.       Marland Kitchen warfarin (COUMADIN) 2.5 MG tablet Take 1 to 1.5 tablets by mouth daily as directed by coumadin clinic  40 tablet  2  . zolpidem (AMBIEN) 10 MG tablet Take 5 mg by mouth at bedtime as needed. sleep      . nitroGLYCERIN (NITRODUR - DOSED IN MG/24 HR) 0.4 mg/hr patch Place 1 patch (0.4 mg total) onto the skin daily.  30 patch  12   No current facility-administered medications for this visit.    Past Medical History  Diagnosis Date  .  Hypertension   . Coronary artery disease   . Legally blind   . Chronic kidney disease   . Anemia   . Hypothyroid   . Gastric ulcer   . Hiatal hernia   .  Dyspepsia   . UTI (lower urinary tract infection)   . H/O: GI bleed 12//13  . Diastolic dysfunction   . Pacemaker   . High cholesterol   . CHF (congestive heart failure)   . Anginal pain   . MI (myocardial infarction) ? 1994  . DVT of upper extremity (deep vein thrombosis) 06/13/2012    BUE  . Seasonal allergies   . Allergy to perfume     "strong perfumes only" (06/13/2012)  . Shortness of breath     "can happen at any time" (06/13/2012)  . Type II diabetes mellitus   . History of blood transfusion 2013    "not related to OR; BP had dropped" (06/13/2012)  . GERD (gastroesophageal reflux disease)   . Daily headache   . Arthritis     "all over" (06/13/2012)  . A-fib   . 2Nd degree atrioventricular block   . Abnormal nuclear stress test, 08/2013 09/17/2013    Past Surgical History  Procedure Laterality Date  . Esophagogastroduodenoscopy  12/22/2011    Procedure: ESOPHAGOGASTRODUODENOSCOPY (EGD);  Surgeon: Gatha Mayer, MD;  Location: [facility];  Service: Endoscopy;  Laterality: N/A;  . Cardioversion  06/06/06    successful  . Insert / replace / remove pacemaker  06/29/2006    Medtronic adapta  . Cardiac catheterization  10/25/92  . Cardiac catheterization  11/18/03    w/grafts 100%CX LAD 80 & 100%  . Cardiac catheterization  01/24/05    diffuse disease of native vessels  . Cardiac catheterization  06/06/06    severe native CAD  . Coronary artery bypass graft  10/27/92    LIMA to LAD,SVG to LAD second diagonal,obtuse maraginal of the CX and posterior descendingbranch of the RCA  . Cataract extraction w/ intraocular lens  implant, bilateral Bilateral   . Refractive surgery Bilateral     GYI:RSWNIOE:VO colds or fevers, no weight changes Skin:no rashes or ulcers HEENT:no blurred vision, no congestion CV:see HPI PUL:see  HPI GI:no diarrhea constipation or melena, no indigestion GU:no hematuria, no dysuria MS:no joint pain, no claudication Neuro:no syncope, no lightheadedness Endo:+ diabetes stable, no thyroid disease  Wt Readings from Last 3 Encounters:  09/15/13 203 lb 8 oz (92.307 kg)  08/29/13 201 lb (91.173 kg)  08/20/13 201 lb (91.173 kg)    PHYSICAL EXAM BP 145/80  Pulse 65  Ht 5\' 4"  (1.626 m)  Wt 203 lb 8 oz (92.307 kg)  BMI 34.91 kg/m2 General:Pleasant affect, NAD Skin:Warm and dry, brisk capillary refill HEENT:normocephalic, sclera clear, mucus membranes moist Neck:supple, no JVD, no bruits  Heart:S1S2 RRR without murmur, gallup, rub or click Lungs:clear without rales, rhonchi, or wheezes JJK:KXFGH,WEXH, non tender, + BS, do not palpate liver spleen or masses Ext:+  lower ext edema stable,  2+ radial pulses Neuro:alert and oriented, MAE, follows commands, + facial symmetry, poor memory   ASSESSMENT AND PLAN Abnormal nuclear stress test, 08/2013 Leane Call was similar to previous stress test with similar perfusion pattern- her EF had decreased increasing the study to high-risk. In discussing cardiac catheterization versus medical therapy the patient and her family including her power of attorney prefer to use medication. If she becomes severely uncomfortable at that point they may change their mind but for now we'll adjust her medications.  Dr. Georgina Peer and I discussed issues with the family as well. We'll adjust her Nitro-Dur patch to 0.4 mg apply in the morning remove in the evening. Stop isosorbide orally. Will increase Lopressor to 25 mg twice a  day. She'll followup in a few months with Dr. Jerilynn Mages. Croitoru.  He will call if problems occur in the meantime   Coronary artery disease, CABG in 1994 with last cath 2008 patent grafts and normal EF Decrease in her EF with abnormal nuclear study. Meds have been adjusted see above  Long term (current) use of anticoagulants stable  Type II or  unspecified type diabetes mellitus without mention of complication, not stated as uncontrolled stable

## 2013-09-15 NOTE — Patient Instructions (Signed)
Your physician recommends that you schedule a follow-up appointment in: 2 Months with Dr Sallyanne Kuster  Your physician has recommended you make the following change in your medication: STOP Isosorbide, Increase Metoprolol tart 25 mg twice daily, increase Nitrodur patch 0.4 mg

## 2013-09-16 ENCOUNTER — Telehealth: Payer: Self-pay | Admitting: Cardiology

## 2013-09-16 NOTE — Telephone Encounter (Signed)
Left message on cell phone (she is not at home #) for [name] to call back

## 2013-09-16 NOTE — Telephone Encounter (Signed)
Pt saw Mickel Baas yesterday and Dr Claiborne Billings, Pt is having pain under left breast and her  heart feels like it is bumping.She have put 2 patches on her today.Please call to advise.

## 2013-09-17 ENCOUNTER — Encounter: Payer: Self-pay | Admitting: Cardiology

## 2013-09-17 NOTE — Assessment & Plan Note (Signed)
Decrease in her EF with abnormal nuclear study. Meds have been adjusted see above

## 2013-09-17 NOTE — Assessment & Plan Note (Addendum)
Lexiscan myoview was similar to previous stress test with similar perfusion pattern- her EF had decreased increasing the study to high-risk. In discussing cardiac catheterization versus medical therapy the patient and her family including her power of attorney prefer to use medication. If she becomes severely uncomfortable at that point they may change their mind but for now we'll adjust her medications.  Dr. Georgina Peer and I discussed issues with the family as well. We'll adjust her Nitro-Dur patch to 0.4 mg apply in the morning remove in the evening. Stop isosorbide orally. Will increase Lopressor to 25 mg twice a day. She'll followup in a few months with Dr. Jerilynn Mages. Croitoru.  He will call if problems occur in the meantime

## 2013-09-17 NOTE — Assessment & Plan Note (Signed)
stable °

## 2013-09-17 NOTE — Telephone Encounter (Signed)
Attempted to call [name] - and her brother Georga Hacking Nicole Kindred) answered. He is on file as emergency contact for patient. He reports improvements in his mom's symptoms since increase in NTG patch dose. RN clarified with Nicole Kindred that the 2 patches on patient were the 0.2mg  dose, as they have not picked up the 0.4mg  patches yet. Phylliss Bob to call back with any future questions/concerns r/t mother's health.

## 2013-09-29 ENCOUNTER — Ambulatory Visit (INDEPENDENT_AMBULATORY_CARE_PROVIDER_SITE_OTHER): Payer: Medicare Other | Admitting: Pharmacist Clinician (PhC)/ Clinical Pharmacy Specialist

## 2013-09-29 DIAGNOSIS — Z7901 Long term (current) use of anticoagulants: Secondary | ICD-10-CM

## 2013-09-29 DIAGNOSIS — I82409 Acute embolism and thrombosis of unspecified deep veins of unspecified lower extremity: Secondary | ICD-10-CM

## 2013-09-29 LAB — POCT INR: INR: 3.1

## 2013-10-17 ENCOUNTER — Other Ambulatory Visit: Payer: Self-pay

## 2013-10-17 MED ORDER — NITROGLYCERIN 0.4 MG/HR TD PT24: 0.4000 mg | MEDICATED_PATCH | Freq: Every day | TRANSDERMAL | Status: DC

## 2013-10-17 NOTE — Telephone Encounter (Signed)
Rx was sent to pharmacy electronically. 

## 2013-10-20 ENCOUNTER — Ambulatory Visit
Admission: RE | Admit: 2013-10-20 | Discharge: 2013-10-20 | Disposition: A | Discharge status: Home / Self Care | Payer: Medicare Other | Source: Ambulatory Visit | Attending: Internal Medicine | Admitting: Internal Medicine

## 2013-10-20 ENCOUNTER — Other Ambulatory Visit: Payer: Self-pay | Admitting: Internal Medicine

## 2013-10-20 DIAGNOSIS — R079 Chest pain, unspecified: Secondary | ICD-10-CM

## 2013-10-20 DIAGNOSIS — R05 Cough: Secondary | ICD-10-CM

## 2013-10-20 DIAGNOSIS — R059 Cough, unspecified: Secondary | ICD-10-CM

## 2013-10-24 ENCOUNTER — Other Ambulatory Visit (INDEPENDENT_AMBULATORY_CARE_PROVIDER_SITE_OTHER): Payer: Medicare Other

## 2013-10-24 ENCOUNTER — Ambulatory Visit (INDEPENDENT_AMBULATORY_CARE_PROVIDER_SITE_OTHER): Payer: Medicare Other | Admitting: Pharmacist Clinician (PhC)/ Clinical Pharmacy Specialist

## 2013-10-24 ENCOUNTER — Encounter: Payer: Self-pay | Admitting: Endocrinology

## 2013-10-24 ENCOUNTER — Ambulatory Visit (INDEPENDENT_AMBULATORY_CARE_PROVIDER_SITE_OTHER): Payer: Medicare Other | Admitting: Endocrinology

## 2013-10-24 VITALS — BP 128/84 | HR 60 | Temp 98.7°F | Resp 16 | Ht 64.0 in | Wt 201.8 lb

## 2013-10-24 DIAGNOSIS — Z7901 Long term (current) use of anticoagulants: Secondary | ICD-10-CM

## 2013-10-24 DIAGNOSIS — E038 Other specified hypothyroidism: Secondary | ICD-10-CM

## 2013-10-24 DIAGNOSIS — E119 Type 2 diabetes mellitus without complications: Secondary | ICD-10-CM

## 2013-10-24 DIAGNOSIS — E063 Autoimmune thyroiditis: Secondary | ICD-10-CM | POA: Insufficient documentation

## 2013-10-24 DIAGNOSIS — I82409 Acute embolism and thrombosis of unspecified deep veins of unspecified lower extremity: Secondary | ICD-10-CM

## 2013-10-24 LAB — COMPREHENSIVE METABOLIC PANEL
ALK PHOS: 70 U/L (ref 39–117)
ALT: 9 U/L (ref 0–35)
AST: 18 U/L (ref 0–37)
Albumin: 3 g/dL — ABNORMAL LOW (ref 3.5–5.2)
BILIRUBIN TOTAL: 0.7 mg/dL (ref 0.2–1.2)
BUN: 15 mg/dL (ref 6–23)
CALCIUM: 9 mg/dL (ref 8.4–10.5)
CO2: 29 mEq/L (ref 19–32)
CREATININE: 1.1 mg/dL (ref 0.4–1.2)
Chloride: 103 mEq/L (ref 96–112)
GFR: 60.47 mL/min (ref >60.00)
Glucose, Bld: 105 mg/dL — ABNORMAL HIGH (ref 70–99)
Potassium: 4.2 mEq/L (ref 3.5–5.1)
Sodium: 136 mEq/L (ref 135–145)
Total Protein: 6.9 g/dL (ref 6.0–8.3)

## 2013-10-24 LAB — GLUCOSE, POCT (MANUAL RESULT ENTRY): POC Glucose: 117 mg/dl — AB (ref 70–99)

## 2013-10-24 LAB — LIPID PANEL
CHOL/HDL RATIO: 6
Cholesterol: 170 mg/dL (ref 0–200)
HDL: 30.5 mg/dL — ABNORMAL LOW (ref >39.00)
NonHDL: 139.5
TRIGLYCERIDES: 201 mg/dL — AB (ref 0.0–149.0)
VLDL: 40.2 mg/dL — ABNORMAL HIGH (ref 0.0–40.0)

## 2013-10-24 LAB — LDL CHOLESTEROL, DIRECT: LDL DIRECT: 92.3 mg/dL

## 2013-10-24 LAB — TSH: TSH: 1.43 u[IU]/mL (ref 0.35–4.50)

## 2013-10-24 LAB — HEMOGLOBIN A1C: HEMOGLOBIN A1C: 7.8 % — AB (ref 4.6–6.5)

## 2013-10-24 LAB — POCT INR: INR: 2.9

## 2013-10-24 NOTE — Patient Instructions (Signed)
Check sugar 2-3x per week, some about 2 hours after meals  If sugar always over 200 take 1/2 Glipizide before dinner

## 2013-10-24 NOTE — Progress Notes (Signed)
Patient ID: [name], female   DOB: December 01, 1920, 80 y.o.   MRN: [id number]   Reason for Appointment: Diabetes follow-up   History of Present Illness   Diagnosis: Type 2 diabetes mellitus, date of diagnosis: 1972.   She had been on insulin for several years and this had been tapered off in 2013 because of weight loss and lower blood sugars. Subsequently blood sugars had been generally reasonably good.  Because of her age and multiple medical problems she has been monitored without insulin  Recent history: Glipizide taken 4-5x per month Her blood sugars are not going unusually high and her A1c has been below 8% She is basically not on any medications now  Also she has been told to have a liberal diet because of her age and she does like to eat sweets periodically Her family will check her blood sugar various times and occasionally at night before supper if the sugar is high and she wants to eat some sweets she will take glipizide which is only about 4-5 times a month No hypoglycemia Her weight has leveled off and has gone up a little   Monitors blood glucose: 1-2 times daily.  Glucometer: Accucheck Aviva.  Blood Glucose readings by recall: >200 once pcs Ams 105-147   Meals: 2 meals at noon and 7 pm; sometimes has lemonade or sprite. Has Glucerna at dinner at times, small portions Physical activity: exercise:none.  Dietician visit: Most recent:2001.   Complications: peripheral neuropathy. Urine microalbumin normal previously    Wt Readings from Last 3 Encounters:  10/24/13 201 lb 12.8 oz (91.536 kg)  09/15/13 203 lb 8 oz (92.307 kg)  08/29/13 201 lb (91.173 kg)   Lab Results  Component Value Date   HGBA1C 7.6* 06/24/2013   HGBA1C 7.8* 01/24/2013   HGBA1C 8.0* 11/18/2012   Lab Results  Component Value Date   LDLCALC 61 06/24/2013   CREATININE 1.23* 08/20/2013       Medication List       This list is accurate as of: 10/24/13 10:21 AM.  Always use your most recent med  list.               ACCU-CHEK AVIVA PLUS W/DEVICE Kit  Use to check blood sugar 2 times per day dx code 250.00     ACCU-CHEK FASTCLIX LANCET Kit  Use as directec     accu-chek multiclix lancets  Use as instructed to check blood sugar 2 times per day dx code 250.00     acetaminophen 500 MG tablet  Commonly known as:  TYLENOL  Take 500-1,000 mg by mouth 2 (two) times daily as needed for mild pain.     acetaminophen-codeine 300-30 MG per tablet  Commonly known as:  TYLENOL #3  Take 1 tablet by mouth 2 (two) times daily as needed for pain.     aspirin 81 MG chewable tablet  Chew 81 mg by mouth daily.     cholecalciferol 1000 UNITS tablet  Commonly known as:  VITAMIN D  Take 1,000 Units by mouth daily.     feeding supplement (GLUCERNA SHAKE) Liqd  Take 237 mLs by mouth 2 (two) times daily between meals.     folic acid 1 MG tablet  Commonly known as:  FOLVITE  Take 1 tablet by mouth  daily     furosemide 40 MG tablet  Commonly known as:  LASIX  Take 1 tablet daily x 3 days then as directed     furosemide 40  MG tablet  Commonly known as:  LASIX  Take 1 tablet daily x 3 days then as directed     gabapentin 100 MG capsule  Commonly known as:  NEURONTIN  Take 100 mg by mouth 2 (two) times daily.     glipiZIDE 2.5 mg Tabs tablet  Commonly known as:  GLUCOTROL  Take 1 tablet only if blood sugar is over 200     glucose blood test strip  Commonly known as:  ACCU-CHEK AVIVA PLUS  Use to check blood sugars 2 times daily dx code 250.00     guaiFENesin 600 MG 12 hr tablet  Commonly known as:  MUCINEX  Take 600 mg by mouth 2 (two) times daily.     hydrocortisone 25 MG suppository  Commonly known as:  ANUSOL-HC  Use 1 suppository rectally at bedtime for 7 days.     Hyoscyamine Sulfate 0.375 MG Tbcr  A1 tab twice a day as needed, abdominal pain     levothyroxine 50 MCG tablet  Commonly known as:  SYNTHROID, LEVOTHROID  Take 50 mcg by mouth daily before breakfast.      Linaclotide 145 MCG Caps capsule  Commonly known as:  LINZESS  Take 1 capsule (145 mcg total) by mouth daily.     loratadine 10 MG tablet  Commonly known as:  CLARITIN  Take 10 mg by mouth daily.     metoprolol tartrate 25 MG tablet  Commonly known as:  LOPRESSOR  Take 1 tablet (25 mg total) by mouth 2 (two) times daily.     mupirocin ointment 2 %  Commonly known as:  BACTROBAN     neomycin-bacitracin-polymyxin [phone] ointment  Place 1 application into the left ear as needed (for inflammation behind the ear).     nitroGLYCERIN 0.4 mg/hr patch  Commonly known as:  NITRODUR - Dosed in mg/24 hr  Place 1 patch (0.4 mg total) onto the skin daily.     omeprazole 20 MG capsule  Commonly known as:  PRILOSEC  Take 1 capsule (20 mg total) by mouth daily.     PATADAY 0.2 % Soln  Generic drug:  Olopatadine HCl  Place 1 drop into both eyes as needed.     potassium chloride SA 20 MEQ tablet  Commonly known as:  K-DUR,KLOR-CON  Take 1 tablet daily x 3 days with furosemide then as directed     rosuvastatin 10 MG tablet  Commonly known as:  CRESTOR  Take 10 mg by mouth at bedtime.     Thymol Crys  Apply 1 application topically 2 (two) times daily. Used for nails     tobramycin 0.3 % ophthalmic solution  Commonly known as:  TOBREX  Place 1 drop into both eyes daily as needed.     Vitamin D (Ergocalciferol) 50000 UNITS Caps capsule  Commonly known as:  DRISDOL  Take 50,000 Units by mouth daily.     warfarin 2.5 MG tablet  Commonly known as:  COUMADIN  Take 1 to 1.5 tablets by mouth daily as directed by coumadin clinic     zolpidem 10 MG tablet  Commonly known as:  AMBIEN  Take 5 mg by mouth at bedtime as needed. sleep        Past Medical History  Diagnosis Date  . Hypertension   . Coronary artery disease   . Legally blind   . Chronic kidney disease   . Anemia   . Hypothyroid   . Gastric ulcer   . Hiatal hernia   .  Dyspepsia   . UTI (lower urinary tract  infection)   . H/O: GI bleed 12//13  . Diastolic dysfunction   . Pacemaker   . High cholesterol   . CHF (congestive heart failure)   . Anginal pain   . MI (myocardial infarction) ? 1994  . DVT of upper extremity (deep vein thrombosis) 06/13/2012    BUE  . Seasonal allergies   . Allergy to perfume     "strong perfumes only" (06/13/2012)  . Shortness of breath     "can happen at any time" (06/13/2012)  . Type II diabetes mellitus   . History of blood transfusion 2013    "not related to OR; BP had dropped" (06/13/2012)  . GERD (gastroesophageal reflux disease)   . Daily headache   . Arthritis     "all over" (06/13/2012)  . A-fib   . 2Nd degree atrioventricular block   . Abnormal nuclear stress test, 08/2013 09/17/2013    Past Surgical History  Procedure Laterality Date  . Esophagogastroduodenoscopy  12/22/2011    Procedure: ESOPHAGOGASTRODUODENOSCOPY (EGD);  Surgeon: Gatha Mayer, MD;  Location: [facility];  Service: Endoscopy;  Laterality: N/A;  . Cardioversion  06/06/06    successful  . Insert / replace / remove pacemaker  06/29/2006    Medtronic adapta  . Cardiac catheterization  10/25/92  . Cardiac catheterization  11/18/03    w/grafts 100%CX LAD 80 & 100%  . Cardiac catheterization  01/24/05    diffuse disease of native vessels  . Cardiac catheterization  06/06/06    severe native CAD  . Coronary artery bypass graft  10/27/92    LIMA to LAD,SVG to LAD second diagonal,obtuse maraginal of the CX and posterior descendingbranch of the RCA  . Cataract extraction w/ intraocular lens  implant, bilateral Bilateral   . Refractive surgery Bilateral     Family History  Problem Relation Age of Onset  . Breast cancer Daughter   . Diabetes Son   . Diabetes Daughter   . Heart disease Father   . Diabetes Father   . Colon polyps Daughter   . Heart attack Brother   . Diabetes Brother   . Stroke Sister     Social History:  reports that she has never smoked. She has never used  smokeless tobacco. She reports that she does not drink alcohol or use illicit drugs.  Allergies:  Allergies  Allergen Reactions  . Darvon     Unknown reaction   . Digoxin And Related Nausea Only  . Penicillins Other (See Comments)    Was told had allergy from childhood... Unknown reaction  . Percocet [Oxycodone-Acetaminophen] Other (See Comments)    confused  . Percodan [Oxycodone-Aspirin]   . Vicodin [Hydrocodone-Acetaminophen] Other (See Comments)    confused    REVIEW of systems:  Hypothyroidism: She is taking only 50 mcg daily with stable TSH   Lab Results  Component Value Date   TSH 4.51* 06/24/2013    She has had renal dysfunction which is long-standing but relatively better more recently  Lab Results  Component Value Date   CREATININE 1.23* 08/20/2013    Has had periodic pedal edema. Does wear elastic stockings  She has significant neuropathy with sensory loss as on her recent foot exam.  Some tingling in fingers and was told to take extra gabapentin as needed  Diabetic foot exam was done on 10/18/12  She is complaining of some nonspecific dizziness and has not discussed with PCP   Examination:  BP 128/84  Pulse 60  Temp(Src) 98.7 F (37.1 C)  Resp 16  Ht _0  (1.626 m)  Wt 201 lb 12.8 oz (91.536 kg)  BMI 34.62 kg/m2  SpO2 95%  Body mass index is 34.62 kg/(m^2).    Assesment/plan:   Diabetes type 2, long-standing and previously on insulin.   With home glucose monitoring her blood sugars are looking fairly good considering her age and multiple medical problems She is taking her low-dose glipizide  only when blood sugars are unusually high and she is eating sweets  Since her A1c has been below 8% will continue to monitor her periodically  2. Hypothyroidism: She has been on a  relatively low dose of 50 mcg for some time and TSH  will be reassessed  today   3. CKD: Creatinine  to be checked today   [facility] 10/24/2013, 10:21 AM   Labs   pending  Office Visit on 10/24/2013  Component Date Value Ref Range Status  . POC Glucose 10/24/2013 117* 70 - 99 mg/dl Final

## 2013-10-25 NOTE — Progress Notes (Signed)
Quick Note:  Please let patient know that the lab result is normal and no further action needed, please fax to PCP ______

## 2013-10-31 ENCOUNTER — Encounter: Payer: Self-pay | Admitting: Nurse Practitioner

## 2013-10-31 ENCOUNTER — Telehealth: Payer: Self-pay | Admitting: Cardiovascular Disease

## 2013-10-31 ENCOUNTER — Ambulatory Visit (INDEPENDENT_AMBULATORY_CARE_PROVIDER_SITE_OTHER): Payer: Medicare Other | Admitting: Nurse Practitioner

## 2013-10-31 VITALS — BP 120/62 | HR 64 | Ht 64.0 in | Wt 198.8 lb

## 2013-10-31 DIAGNOSIS — Z950 Presence of cardiac pacemaker: Secondary | ICD-10-CM

## 2013-10-31 DIAGNOSIS — I4891 Unspecified atrial fibrillation: Secondary | ICD-10-CM

## 2013-10-31 DIAGNOSIS — R079 Chest pain, unspecified: Secondary | ICD-10-CM

## 2013-10-31 DIAGNOSIS — I25119 Atherosclerotic heart disease of native coronary artery with unspecified angina pectoris: Secondary | ICD-10-CM

## 2013-10-31 MED ORDER — METOPROLOL TARTRATE 25 MG PO TABS: 25.0000 mg | ORAL_TABLET | Freq: Three times a day (TID) | ORAL | Status: DC

## 2013-10-31 NOTE — Progress Notes (Signed)
[name] Date of Birth: September 22, 1920 Medical Record #[id number]  History of Present Illness: Ms. [name] is seen back today for a work in visit. Seen for Dr. Sallyanne Kuster. She is a 80 year old female who has a history of CAD, prior CABG, PPM for high grade AV block. PAF, prior DVT and abnormal stress test. She is blind and cannot hear. She is noted to have memory issues. She is pretty much non ambulatory.   Saw Cecilie Kicks, NP back in August - had had chest pain then - had had a Myoview - this was abnormal - high risk stress study with large, severe intensity, partially reversible, inferior and apical defect consistent with prior inferior apical infarct and mild peri infarct ischemia - reduced EF. It was decided among her family that they would NOT proceed with cardiac catheterization. She was to continue with medical management.   Comes in here today. Here with her son and daughter in law - they provide a lot of the history. She complains of chest pain - on the left side - seemed to be worse today - had in her jaw - family using tylenol and tylenol #4 as needed with relief. Some palpitations as well. Her head feels tight. She has had recent URI and has been treated with antibiotics. They wish to continue with comfort care - not interested in hospitalization. Not taking her lasix at this time.   Current Outpatient Prescriptions  Medication Sig Dispense Refill  . acetaminophen (TYLENOL) 500 MG tablet Take 500-1,000 mg by mouth 2 (two) times daily as needed for mild pain.      Marland Kitchen acetaminophen-codeine (TYLENOL #3) 300-30 MG per tablet Take 1 tablet by mouth 2 (two) times daily as needed for pain.      Marland Kitchen aspirin 81 MG chewable tablet Chew 81 mg by mouth daily.      . Blood Glucose Monitoring Suppl (ACCU-CHEK AVIVA PLUS) W/DEVICE KIT Use to check blood sugar 2 times per day dx code 250.00  1 kit  0  . cholecalciferol (VITAMIN D) 1000 UNITS tablet Take 1,000 Units by mouth daily.      . feeding  supplement, GLUCERNA SHAKE, (GLUCERNA SHAKE) LIQD Take 237 mLs by mouth 2 (two) times daily between meals.      . folic acid (FOLVITE) 1 MG tablet Take 1 tablet by mouth  daily  90 tablet  1  . furosemide (LASIX) 40 MG tablet Take 1 tablet daily x 3 days then as directed  30 tablet  6  . gabapentin (NEURONTIN) 100 MG capsule Take 100 mg by mouth 2 (two) times daily.       Marland Kitchen glipiZIDE (GLUCOTROL) 2.5 mg TABS tablet Take 1 tablet only if blood sugar is over 200  90 tablet  0  . glucose blood (ACCU-CHEK AVIVA PLUS) test strip Use to check blood sugars 2 times daily dx code 250.00  200 each  3  . guaiFENesin (MUCINEX) 600 MG 12 hr tablet Take 600 mg by mouth 2 (two) times daily.      . hydrocortisone (ANUSOL-HC) 25 MG suppository Use 1 suppository rectally at bedtime for 7 days.  7 suppository  1  . Hyoscyamine Sulfate 0.375 MG TBCR A1 tab twice a day as needed, abdominal pain  25 tablet  1  . Lancets (ACCU-CHEK MULTICLIX) lancets Use as instructed to check blood sugar 2 times per day dx code 250.00  200 each  1  . Lancets Misc. (ACCU-CHEK FASTCLIX LANCET)  KIT Use as directec  1 kit  1  . levothyroxine (SYNTHROID, LEVOTHROID) 50 MCG tablet Take 50 mcg by mouth daily before breakfast.      . Linaclotide (LINZESS) 145 MCG CAPS capsule Take 1 capsule (145 mcg total) by mouth daily.  30 capsule  3  . loratadine (CLARITIN) 10 MG tablet Take 10 mg by mouth daily.      . metoprolol tartrate (LOPRESSOR) 25 MG tablet Take 1 tablet (25 mg total) by mouth 2 (two) times daily.  60 tablet  6  . mupirocin ointment (BACTROBAN) 2 %       . neomycin-bacitracin-polymyxin (NEOSPORIN) [phone] ointment Place 1 application into the left ear as needed (for inflammation behind the ear).      . nitroGLYCERIN (NITRODUR - DOSED IN MG/24 HR) 0.4 mg/hr patch Place 1 patch (0.4 mg total) onto the skin daily.  90 patch  3  . omeprazole (PRILOSEC) 20 MG capsule Take 1 capsule (20 mg total) by mouth daily.  90 capsule  3  .  PATADAY 0.2 % SOLN Place 1 drop into both eyes as needed.       . rosuvastatin (CRESTOR) 10 MG tablet Take 10 mg by mouth at bedtime.       . Thymol CRYS Apply 1 application topically 2 (two) times daily. Used for nails      . tobramycin (TOBREX) 0.3 % ophthalmic solution Place 1 drop into both eyes daily as needed.       . Vitamin D, Ergocalciferol, (DRISDOL) 50000 UNITS CAPS capsule Take 50,000 Units by mouth daily.       Marland Kitchen warfarin (COUMADIN) 2.5 MG tablet Take 1 to 1.5 tablets by mouth daily as directed by coumadin clinic  40 tablet  2  . zolpidem (AMBIEN) 10 MG tablet Take 5 mg by mouth at bedtime as needed. sleep      . furosemide (LASIX) 40 MG tablet Take 1 tablet daily x 3 days then as directed  30 tablet  0  . potassium chloride SA (K-DUR,KLOR-CON) 20 MEQ tablet Take 1 tablet daily x 3 days with furosemide then as directed  30 tablet  0   No current facility-administered medications for this visit.    Allergies  Allergen Reactions  . Darvon     Unknown reaction   . Digoxin And Related Nausea Only  . Penicillins Other (See Comments)    Was told had allergy from childhood... Unknown reaction  . Percocet [Oxycodone-Acetaminophen] Other (See Comments)    confused  . Percodan [Oxycodone-Aspirin]   . Vicodin [Hydrocodone-Acetaminophen] Other (See Comments)    confused    Past Medical History  Diagnosis Date  . Hypertension   . Coronary artery disease   . Legally blind   . Chronic kidney disease   . Anemia   . Hypothyroid   . Gastric ulcer   . Hiatal hernia   . Dyspepsia   . UTI (lower urinary tract infection)   . H/O: GI bleed 12//13  . Diastolic dysfunction   . Pacemaker   . High cholesterol   . CHF (congestive heart failure)   . Anginal pain   . MI (myocardial infarction) ? 1994  . DVT of upper extremity (deep vein thrombosis) 06/13/2012    BUE  . Seasonal allergies   . Allergy to perfume     "strong perfumes only" (06/13/2012)  . Shortness of breath     "can  happen at any time" (06/13/2012)  . Type II  diabetes mellitus   . History of blood transfusion 2013    "not related to OR; BP had dropped" (06/13/2012)  . GERD (gastroesophageal reflux disease)   . Daily headache   . Arthritis     "all over" (06/13/2012)  . A-fib   . 2Nd degree atrioventricular block   . Abnormal nuclear stress test, 08/2013 09/17/2013    Past Surgical History  Procedure Laterality Date  . Esophagogastroduodenoscopy  12/22/2011    Procedure: ESOPHAGOGASTRODUODENOSCOPY (EGD);  Surgeon: Gatha Mayer, MD;  Location: [facility];  Service: Endoscopy;  Laterality: N/A;  . Cardioversion  06/06/06    successful  . Insert / replace / remove pacemaker  06/29/2006    Medtronic adapta  . Cardiac catheterization  10/25/92  . Cardiac catheterization  11/18/03    w/grafts 100%CX LAD 80 & 100%  . Cardiac catheterization  01/24/05    diffuse disease of native vessels  . Cardiac catheterization  06/06/06    severe native CAD  . Coronary artery bypass graft  10/27/92    LIMA to LAD,SVG to LAD second diagonal,obtuse maraginal of the CX and posterior descendingbranch of the RCA  . Cataract extraction w/ intraocular lens  implant, bilateral Bilateral   . Refractive surgery Bilateral     History  Smoking status  . Never Smoker   Smokeless tobacco  . Never Used    History  Alcohol Use No    Family History  Problem Relation Age of Onset  . Breast cancer Daughter   . Diabetes Son   . Diabetes Daughter   . Heart disease Father   . Diabetes Father   . Colon polyps Daughter   . Heart attack Brother   . Diabetes Brother   . Stroke Sister     Review of Systems: The review of systems is per the HPI.  All other systems were reviewed and are negative.  Physical Exam: BP 120/62  Pulse 64  Ht $R'5\' 4"'Jl$  (1.626 m)  Wt 198 lb 12.8 oz (90.175 kg)  BMI 34.11 kg/m2 Patient is very pleasant and in no acute distress. She is hard of hearing - I talked with her using my stethoscope in her  ears. She is obese. Weight is down a few pounds. Skin is warm and dry. Color is normal.  HEENT is unremarkable. Normocephalic/atraumatic. PERRL. Sclera are nonicteric. Neck is supple. No masses. No JVD. Lungs are clear. Cardiac exam shows a regular rate and rhythm. Abdomen is soft. Extremities are without edema. Gait and ROM are intact. No gross neurologic deficits noted.  Wt Readings from Last 3 Encounters:  10/31/13 198 lb 12.8 oz (90.175 kg)  10/24/13 201 lb 12.8 oz (91.536 kg)  09/15/13 203 lb 8 oz (92.307 kg)    LABORATORY DATA/PROCEDURES: EKG shows paced rhythm.   Lab Results  Component Value Date   WBC 4.9 08/20/2013   HGB 12.6 08/20/2013   HCT 38.4 08/20/2013   PLT 164 08/20/2013   GLUCOSE 105* 10/24/2013   CHOL 170 10/24/2013   TRIG 201.0* 10/24/2013   HDL 30.50* 10/24/2013   LDLDIRECT 92.3 10/24/2013   LDLCALC 61 06/24/2013   ALT 9 10/24/2013   AST 18 10/24/2013   NA 136 10/24/2013   K 4.2 10/24/2013   CL 103 10/24/2013   CREATININE 1.1 10/24/2013   BUN 15 10/24/2013   CO2 29 10/24/2013   TSH 1.43 10/24/2013   INR 2.9 10/24/2013   HGBA1C 7.8* 10/24/2013   Lab Results  Component Value Date  INR 2.9 10/24/2013   INR 3.1 09/29/2013   INR 3.9 09/15/2013    BNP (last 3 results)  Recent Labs  11/20/12 0458 02/14/13 1820  PROBNP 1883.0* 991.8*     MYOVIEW LV Ejection Fraction: 35%  Rest ECG: Sinus rhythm with ventricular pacing.  Stress ECG: Uninteretable due to baseline ventricular pacing  QPS Raw Data Images: Acquisition technically good; LVE. Stress Images: There is decreased uptake in the inferior wall  and apex. Rest Images: There is decreased uptake in the inferior wall and  apex, slightly less prominent compared to stress images. Subtraction (SDS): These findings are consistent with prior  inferior and apical infarct and mild peri-infarct ischemia.  Impression Exercise Capacity: Lexiscan with no exercise. BP Response: Normal blood pressure response. Clinical  Symptoms: There is dyspnea. ECG Impression: EKG uninterpretable due to ventricular pacing at rest and stress. Comparison with Prior Nuclear Study: LV function reduced compared to previous  Overall Impression: High risk stress nuclear study with large,  severe intensity, partially reversible, inferior and apical  defect consistent with prior inferior apical infarct and mild  peri-infarct ischemia towards the apex; perfusion similar to  previous study; study high risk due to reduced LV function.  LV Wall Motion: Inferior and apical akinesis; LV function worse  compared to previous.   Kirk Ruths, MD  08/29/2013 1:13 PM   Assessment / Plan: 1. Chest pain - known CAD with recent abnormal Myoview - managed medically/conservatively - will increase her Lopressor to TID. Also use sl NTG prn. Continue with tylenol and tylenol #4 as well. She has follow up with Dr. Sallyanne Kuster in just a couple of weeks. Family again does not wish for invasive procedures.  2. Recent URI  3. Advanced age.  Patient is agreeable to this plan and will call if any problems develop in the interim.   Burtis Junes, RN, [facility] [address] [place], [place]  [address] [phone]

## 2013-10-31 NOTE — Telephone Encounter (Signed)
Calling because Mrs. Liverman (mother ) is having chest pains that are going and coming and she states that they are active . But Mrs Pickerel says that they have ease up .Marland Kitchen Please Call   Thanks

## 2013-10-31 NOTE — Telephone Encounter (Signed)
Spoke with [name] (patient's daughter) and patient. Patient reports thumping & hurting in her chest. She has had chest pain off and on but it is worse today. She reports SOB especially while talking. She gets dizzy with position changes, such as when she turns on right side to get bathed. She has not taken anything to alleviate chest pain.

## 2013-10-31 NOTE — Patient Instructions (Signed)
Increase the metoprolol to three times a day  See Dr. Loletha Grayer as planned in November  Continue with her other medicines  Call the [place] office at [phone] if you have any questions, problems or concerns.

## 2013-10-31 NOTE — Telephone Encounter (Signed)
Patient scheduled for 2pm @ [employer]

## 2013-11-19 ENCOUNTER — Ambulatory Visit (INDEPENDENT_AMBULATORY_CARE_PROVIDER_SITE_OTHER): Payer: Medicare Other | Admitting: Pharmacist Clinician (PhC)/ Clinical Pharmacy Specialist

## 2013-11-19 ENCOUNTER — Encounter: Payer: Self-pay | Admitting: Cardiovascular Disease

## 2013-11-19 ENCOUNTER — Ambulatory Visit (INDEPENDENT_AMBULATORY_CARE_PROVIDER_SITE_OTHER): Payer: Medicare Other | Admitting: Cardiovascular Disease

## 2013-11-19 VITALS — BP 110/70 | HR 64 | Resp 16 | Ht 64.0 in | Wt 195.0 lb

## 2013-11-19 DIAGNOSIS — Z7901 Long term (current) use of anticoagulants: Secondary | ICD-10-CM

## 2013-11-19 DIAGNOSIS — Z950 Presence of cardiac pacemaker: Secondary | ICD-10-CM

## 2013-11-19 DIAGNOSIS — I480 Paroxysmal atrial fibrillation: Secondary | ICD-10-CM

## 2013-11-19 DIAGNOSIS — I10 Essential (primary) hypertension: Secondary | ICD-10-CM

## 2013-11-19 DIAGNOSIS — R079 Chest pain, unspecified: Secondary | ICD-10-CM

## 2013-11-19 DIAGNOSIS — I442 Atrioventricular block, complete: Secondary | ICD-10-CM

## 2013-11-19 DIAGNOSIS — I5032 Chronic diastolic (congestive) heart failure: Secondary | ICD-10-CM

## 2013-11-19 DIAGNOSIS — I25119 Atherosclerotic heart disease of native coronary artery with unspecified angina pectoris: Secondary | ICD-10-CM

## 2013-11-19 DIAGNOSIS — I82409 Acute embolism and thrombosis of unspecified deep veins of unspecified lower extremity: Secondary | ICD-10-CM

## 2013-11-19 LAB — MDC_IDC_ENUM_SESS_TYPE_INCLINIC
Battery Remaining Longevity: 24 mo
Battery Voltage: 2.73 V
Brady Statistic AP VP Percent: 35 %
Brady Statistic AS VS Percent: 0 %
Lead Channel Impedance Value: 646 Ohm
Lead Channel Pacing Threshold Amplitude: 0.75 V
Lead Channel Pacing Threshold Pulse Width: 0.4 ms
Lead Channel Setting Pacing Amplitude: 2 V
Lead Channel Setting Pacing Pulse Width: 0.4 ms
Lead Channel Setting Sensing Sensitivity: 2.8 mV
MDC IDC MSMT BATTERY IMPEDANCE: 2138 Ohm
MDC IDC MSMT LEADCHNL RA IMPEDANCE VALUE: 516 Ohm
MDC IDC MSMT LEADCHNL RA PACING THRESHOLD AMPLITUDE: 0.75 V
MDC IDC MSMT LEADCHNL RA PACING THRESHOLD PULSEWIDTH: 0.4 ms
MDC IDC MSMT LEADCHNL RA SENSING INTR AMPL: 2 mV
MDC IDC SESS DTM: 20151104170226
MDC IDC SET LEADCHNL RV PACING AMPLITUDE: 2 V
MDC IDC STAT BRADY AP VS PERCENT: 0 %
MDC IDC STAT BRADY AS VP PERCENT: 64 %

## 2013-11-19 LAB — POCT INR: INR: 2.5

## 2013-11-19 NOTE — Patient Instructions (Signed)
Remote monitoring is used to monitor your Pacemaker  from home. This monitoring reduces the number of office visits required to check your device to one time per year. It allows [place] to keep an eye on the functioning of your device to ensure it is working properly. You are scheduled for a device check from home on 02-19-2014. You may send your transmission at any time that day. If you have a wireless device, the transmission will be sent automatically. After your physician reviews your transmission, you will receive a postcard with your next transmission date.  Your physician recommends that you schedule a follow-up appointment in: 6 months

## 2013-11-19 NOTE — Progress Notes (Signed)
Patient ID: [name], female   DOB: 03/29/1920, 80 y.o.   MRN: [id number]     Reason for office visit Complete heart block, paroxysmal atrial fibrillationCAD, pacemaker check, history of venous thromboembolic disease, chronic diastolic heart failure  [name] has not had any serious medical illnesses since her last appointment. She is accompanied to the office today by her oldest son who has moved back to [place].  As before she continues to complain of intermittent shooting left upper quadrant discomfort radiating towards her chest. This has been evaluated many times in the past and is likely to be gastroenteric in source. Most recently she had a nuclear stress test in August 2015 that showed a large and severe partly reversible inferior and apical defect, mostly scar of an old infarction. Left ventricle systolic function was depressed with an ejection fraction of 35%. This was discussed with the patient and her family and they prefer conservative management, which I think appropriate in this 80 year old.  She has not had syncope or falls. She has not had any bleeding problems despite taking combination of antiplatelet and anticoagulant medication. She takes warfarin both for history of paroxysmal atrial fibrillation and previous pulmonary embolism with DVT in both lower extremities and upper extremities in the past.She denies angina pectoris and her breathing is unchanged from baseline. She is morbidly obese and extremely sedentary. 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  Interrogation of her pacemaker shows normal device function. The device was implanted for symptomatic sinus bradycardia and second-degree heart block in 2008 but she has subsequently progressed to complete heart block and is pacemaker dependent. She has a Medtronic Adapta implanted in 2008 with an estimated 24 months of generator longevity left. No atrial  fibrillation has been recorded. She had one 8 beat run of nonsustained ventricular tachycardia and has had frequent but very brief episodes of atrial tachycardia. There is 100% ventricular pacing and roughly 35% atrial pacing. Lead parameters are excellent.   Allergies  Allergen Reactions  . Darvon     Unknown reaction   . Digoxin And Related Nausea Only  . Penicillins Other (See Comments)    Was told had allergy from childhood... Unknown reaction  . Percocet [Oxycodone-Acetaminophen] Other (See Comments)    confused  . Percodan [Oxycodone-Aspirin]   . Vicodin [Hydrocodone-Acetaminophen] Other (See Comments)    confused    Current Outpatient Prescriptions  Medication Sig Dispense Refill  . acetaminophen (TYLENOL) 500 MG tablet Take 500-1,000 mg by mouth 2 (two) times daily as needed for mild pain.    Marland Kitchen acetaminophen-codeine (TYLENOL #3) 300-30 MG per tablet Take 1 tablet by mouth 2 (two) times daily as needed for pain.    Marland Kitchen aspirin 81 MG chewable tablet Chew 81 mg by mouth daily.    Marland Kitchen atropine 1 % ophthalmic solution daily.  0  . Blood Glucose Monitoring Suppl (ACCU-CHEK AVIVA PLUS) W/DEVICE KIT Use to check blood sugar 2 times per day dx code 250.00 1 kit 0  . cholecalciferol (VITAMIN D) 1000 UNITS tablet Take 1,000 Units by mouth daily.    . feeding supplement, GLUCERNA SHAKE, (GLUCERNA SHAKE) LIQD Take 237 mLs by mouth 2 (two) times daily between meals.    . folic acid (FOLVITE) 1 MG tablet Take 1 tablet by mouth  daily 90 tablet 1  . furosemide (LASIX) 40 MG tablet Take 1 tablet daily x 3 days then as directed (Patient taking  differently: 40 mg every other day. Take 1 tablet daily x 3 days then as directed) 30 tablet 6  . gabapentin (NEURONTIN) 100 MG capsule Take 100 mg by mouth 2 (two) times daily.     Marland Kitchen glipiZIDE (GLUCOTROL) 2.5 mg TABS tablet Take 1 tablet only if blood sugar is over 200 90 tablet 0  . glucose blood (ACCU-CHEK AVIVA PLUS) test strip Use to check blood sugars 2  times daily dx code 250.00 200 each 3  . guaiFENesin (MUCINEX) 600 MG 12 hr tablet Take 600 mg by mouth 2 (two) times daily.    . hydrocortisone (ANUSOL-HC) 25 MG suppository Use 1 suppository rectally at bedtime for 7 days. 7 suppository 1  . Hyoscyamine Sulfate 0.375 MG TBCR A1 tab twice a day as needed, abdominal pain 25 tablet 1  . Lancets (ACCU-CHEK MULTICLIX) lancets Use as instructed to check blood sugar 2 times per day dx code 250.00 200 each 1  . Lancets Misc. (ACCU-CHEK FASTCLIX LANCET) KIT Use as directec 1 kit 1  . levothyroxine (SYNTHROID, LEVOTHROID) 50 MCG tablet Take 50 mcg by mouth daily before breakfast.    . Linaclotide (LINZESS) 145 MCG CAPS capsule Take 1 capsule (145 mcg total) by mouth daily. 30 capsule 3  . loratadine (CLARITIN) 10 MG tablet Take 10 mg by mouth daily.    . metoprolol tartrate (LOPRESSOR) 25 MG tablet Take 1 tablet (25 mg total) by mouth 3 (three) times daily. 90 tablet 6  . mupirocin ointment (BACTROBAN) 2 %     . neomycin-bacitracin-polymyxin (NEOSPORIN) [phone] ointment Place 1 application into the left ear as needed (for inflammation behind the ear).    . nitroGLYCERIN (NITRODUR - DOSED IN MG/24 HR) 0.2 mg/hr patch daily.  0  . nitroGLYCERIN (NITRODUR - DOSED IN MG/24 HR) 0.4 mg/hr patch Place 1 patch (0.4 mg total) onto the skin daily. 90 patch 3  . omeprazole (PRILOSEC) 20 MG capsule Take 1 capsule (20 mg total) by mouth daily. 90 capsule 3  . PATADAY 0.2 % SOLN Place 1 drop into both eyes as needed.     . potassium chloride SA (K-DUR,KLOR-CON) 20 MEQ tablet Take 1 tablet daily x 3 days with furosemide then as directed (Patient taking differently: Take 20 mEq by mouth every other day. Take 1 tablet daily x 3 days with furosemide then as directed) 30 tablet 0  . rosuvastatin (CRESTOR) 10 MG tablet Take 10 mg by mouth at bedtime.     . Thymol CRYS Apply 1 application topically 2 (two) times daily. Used for nails    . tobramycin (TOBREX) 0.3 %  ophthalmic solution Place 1 drop into both eyes daily as needed.     . tobramycin-dexamethasone (TOBRADEX) ophthalmic solution 4 (four) times daily.  0  . Vitamin D, Ergocalciferol, (DRISDOL) 50000 UNITS CAPS capsule Take 50,000 Units by mouth daily.     Marland Kitchen warfarin (COUMADIN) 2.5 MG tablet Take 1 to 1.5 tablets by mouth daily as directed by coumadin clinic 40 tablet 2  . zolpidem (AMBIEN) 10 MG tablet Take 5 mg by mouth at bedtime as needed. sleep     No current facility-administered medications for this visit.    Past Medical History  Diagnosis Date  . Hypertension   . Coronary artery disease   . Legally blind   . Chronic kidney disease   . Anemia   . Hypothyroid   . Gastric ulcer   . Hiatal hernia   . Dyspepsia   .  UTI (lower urinary tract infection)   . H/O: GI bleed 12//13  . Diastolic dysfunction   . Pacemaker   . High cholesterol   . CHF (congestive heart failure)   . Anginal pain   . MI (myocardial infarction) ? 1994  . DVT of upper extremity (deep vein thrombosis) 06/13/2012    BUE  . Seasonal allergies   . Allergy to perfume     "strong perfumes only" (06/13/2012)  . Shortness of breath     "can happen at any time" (06/13/2012)  . Type II diabetes mellitus   . History of blood transfusion 2013    "not related to OR; BP had dropped" (06/13/2012)  . GERD (gastroesophageal reflux disease)   . Daily headache   . Arthritis     "all over" (06/13/2012)  . A-fib   . 2Nd degree atrioventricular block   . Abnormal nuclear stress test, 08/2013 09/17/2013    Past Surgical History  Procedure Laterality Date  . Esophagogastroduodenoscopy  12/22/2011    Procedure: ESOPHAGOGASTRODUODENOSCOPY (EGD);  Surgeon: Gatha Mayer, MD;  Location: [facility];  Service: Endoscopy;  Laterality: N/A;  . Cardioversion  06/06/06    successful  . Insert / replace / remove pacemaker  06/29/2006    Medtronic adapta  . Cardiac catheterization  10/25/92  . Cardiac catheterization  11/18/03     w/grafts 100%CX LAD 80 & 100%  . Cardiac catheterization  01/24/05    diffuse disease of native vessels  . Cardiac catheterization  06/06/06    severe native CAD  . Coronary artery bypass graft  10/27/92    LIMA to LAD,SVG to LAD second diagonal,obtuse maraginal of the CX and posterior descendingbranch of the RCA  . Cataract extraction w/ intraocular lens  implant, bilateral Bilateral   . Refractive surgery Bilateral     Family History  Problem Relation Age of Onset  . Breast cancer Daughter   . Diabetes Son   . Diabetes Daughter   . Heart disease Father   . Diabetes Father   . Colon polyps Daughter   . Heart attack Brother   . Diabetes Brother   . Stroke Sister     History   Social History  . Marital Status: Married    Spouse Name: N/A    Number of Children: 9  . Years of Education: N/A   Occupational History  .     Social History Main Topics  . Smoking status: Never Smoker   . Smokeless tobacco: Never Used  . Alcohol Use: No  . Drug Use: No  . Sexual Activity: No   Other Topics Concern  . Not on file   Social History Narrative    Review of systems: Mild edema in the ankles bilaterally in a symmetrical pattern. Atypical left upper quadrant pain radiating towards the chest, not associated with physical activity  The patient specifically denies any dyspnea at rest or change in mild dyspnea with exertion, orthopnea, paroxysmal nocturnal dyspnea, syncope, palpitations, focal neurological deficits, intermittent claudication, unexplained weight gain, cough, hemoptysis or wheezing.  The patient also denies abdominal pain, nausea, vomiting, dysphagia, diarrhea, constipation, polyuria, polydipsia, dysuria, hematuria, frequency, urgency, abnormal bleeding or bruising, fever, chills, unexpected weight changes, mood swings, change in skin or hair texture, change in voice quality, auditory or visual problems, allergic reactions or rashes, new musculoskeletal complaints other  than usual "aches and pains".   PHYSICAL EXAM BP 110/70 mmHg  Pulse 64  Resp 16  Ht $R'5\' 4"'SF$  (  1.626 m)  Wt 195 lb (88.451 kg)  BMI 33.46 kg/m2 Mild edema in the ankles bilaterally in a symmetrical pattern  The patient specifically denies any dyspnea at rest or change in mild dyspnea with exertion, orthopnea, paroxysmal nocturnal dyspnea, syncope, palpitations, focal neurological deficits, intermittent claudication, unexplained weight gain, cough, hemoptysis or wheezing.  The patient also denies abdominal pain, nausea, vomiting, dysphagia, diarrhea, constipation, polyuria, polydipsia, dysuria, hematuria, frequency, urgency, abnormal bleeding or bruising, fever, chills, unexpected weight changes, mood swings, change in skin or hair texture, change in voice quality, auditory or visual problems, allergic reactions or rashes, new musculoskeletal complaints other than usual "aches and pains".   EKG: atrial sensed ventricular paced  Lipid Panel     Component Value Date/Time   CHOL 170 10/24/2013 1006   TRIG 201.0* 10/24/2013 1006   HDL 30.50* 10/24/2013 1006   CHOLHDL 6 10/24/2013 1006   VLDL 40.2* 10/24/2013 1006   LDLCALC 61 06/24/2013 1127   LDLDIRECT 92.3 10/24/2013 1006    BMET    Component Value Date/Time   NA 136 10/24/2013 1006   K 4.2 10/24/2013 1006   CL 103 10/24/2013 1006   CO2 29 10/24/2013 1006   GLUCOSE 105* 10/24/2013 1006   BUN 15 10/24/2013 1006   CREATININE 1.1 10/24/2013 1006   CREATININE 1.23* 08/20/2013 0957   CALCIUM 9.0 10/24/2013 1006   GFRNONAA 33* 02/14/2013 1820   GFRAA 38* 02/14/2013 1820     ASSESSMENT AND PLAN Pacemaker, medtronic Adapta 06/29/06, complete heart block Normal device function, anticipated generator change out in about 24 months. Her son will help with downloads from home every 3 months. We'll see her back in the office in 6 months  A-fib Asymptomatic and brief. No change in meds. Tolerating warfarin without bleeding  complications.  History of DVT of lower extremities and upper extremities and previous pulmonary embolism  Coronary artery disease, CABG in 1994 with last cath 2008 patent grafts and normal EF I don't think her left upper quadrant discomfort represents angina. She is known to have a large mostly fixed inferolateral defect suggestive of an infarction with some peri-infarct ischemia. Because of her age, morbid obesity and numerous comorbid conditions she is not a good candidate for invasive evaluation and treatment. Her symptoms appear to be well controlled  Essential hypertension Good blood pressure control  Hyperlipidemia Fair lipid profile   Orders Placed This Encounter  Procedures  . Implantable device check  . EKG 12-Lead   Meds ordered this encounter  Medications  . atropine 1 % ophthalmic solution    Sig: daily.    Refill:  0  . nitroGLYCERIN (NITRODUR - DOSED IN MG/24 HR) 0.2 mg/hr patch    Sig: daily.    Refill:  0  . tobramycin-dexamethasone (TOBRADEX) ophthalmic solution    Sig: 4 (four) times daily.    Refill:  0    Stanislawa Gaffin  Sanda Klein, MD, [facility] [phone] office [phone] pager

## 2013-11-26 ENCOUNTER — Encounter: Payer: Self-pay | Admitting: Cardiovascular Disease

## 2013-12-28 ENCOUNTER — Other Ambulatory Visit: Payer: Self-pay | Admitting: Cardiovascular Disease

## 2013-12-29 NOTE — Telephone Encounter (Signed)
Rx has been sent to the pharmacy electronically. ° °

## 2014-01-05 ENCOUNTER — Other Ambulatory Visit: Payer: Self-pay | Admitting: *Deleted

## 2014-01-05 MED ORDER — LEVOTHYROXINE SODIUM 50 MCG PO TABS: 50.0000 ug | ORAL_TABLET | Freq: Every day | ORAL | Status: DC

## 2014-01-16 ENCOUNTER — Other Ambulatory Visit: Payer: Self-pay | Admitting: Cardiovascular Disease

## 2014-01-16 ENCOUNTER — Other Ambulatory Visit: Payer: Self-pay | Admitting: Endocrinology

## 2014-01-19 NOTE — Telephone Encounter (Signed)
Rx(s) sent to pharmacy electronically.  

## 2014-02-04 ENCOUNTER — Inpatient Hospital Stay (HOSPITAL_COMMUNITY)
Admission: EM | Admit: 2014-02-04 | Discharge: 2014-02-07 | DRG: 438 | Disposition: A | Discharge status: Home Health | Payer: Medicare Other | Attending: Internal Medicine | Admitting: Internal Medicine

## 2014-02-04 ENCOUNTER — Emergency Department (HOSPITAL_COMMUNITY): Payer: Medicare Other

## 2014-02-04 ENCOUNTER — Other Ambulatory Visit: Payer: Self-pay

## 2014-02-04 ENCOUNTER — Encounter (HOSPITAL_COMMUNITY): Payer: Self-pay | Admitting: Radiology

## 2014-02-04 DIAGNOSIS — Z823 Family history of stroke: Secondary | ICD-10-CM | POA: Diagnosis not present

## 2014-02-04 DIAGNOSIS — I441 Atrioventricular block, second degree: Secondary | ICD-10-CM | POA: Diagnosis present

## 2014-02-04 DIAGNOSIS — Z950 Presence of cardiac pacemaker: Secondary | ICD-10-CM

## 2014-02-04 DIAGNOSIS — Z7982 Long term (current) use of aspirin: Secondary | ICD-10-CM

## 2014-02-04 DIAGNOSIS — Z86718 Personal history of other venous thrombosis and embolism: Secondary | ICD-10-CM | POA: Diagnosis not present

## 2014-02-04 DIAGNOSIS — E663 Overweight: Secondary | ICD-10-CM | POA: Diagnosis present

## 2014-02-04 DIAGNOSIS — I2510 Atherosclerotic heart disease of native coronary artery without angina pectoris: Secondary | ICD-10-CM | POA: Diagnosis present

## 2014-02-04 DIAGNOSIS — Z880 Allergy status to penicillin: Secondary | ICD-10-CM

## 2014-02-04 DIAGNOSIS — Z7901 Long term (current) use of anticoagulants: Secondary | ICD-10-CM

## 2014-02-04 DIAGNOSIS — Z885 Allergy status to narcotic agent status: Secondary | ICD-10-CM | POA: Diagnosis not present

## 2014-02-04 DIAGNOSIS — R109 Unspecified abdominal pain: Secondary | ICD-10-CM | POA: Diagnosis present

## 2014-02-04 DIAGNOSIS — N189 Chronic kidney disease, unspecified: Secondary | ICD-10-CM | POA: Diagnosis present

## 2014-02-04 DIAGNOSIS — E860 Dehydration: Secondary | ICD-10-CM | POA: Diagnosis present

## 2014-02-04 DIAGNOSIS — N390 Urinary tract infection, site not specified: Secondary | ICD-10-CM | POA: Diagnosis present

## 2014-02-04 DIAGNOSIS — Z833 Family history of diabetes mellitus: Secondary | ICD-10-CM

## 2014-02-04 DIAGNOSIS — Z8711 Personal history of peptic ulcer disease: Secondary | ICD-10-CM

## 2014-02-04 DIAGNOSIS — Z951 Presence of aortocoronary bypass graft: Secondary | ICD-10-CM

## 2014-02-04 DIAGNOSIS — K5900 Constipation, unspecified: Secondary | ICD-10-CM | POA: Diagnosis present

## 2014-02-04 DIAGNOSIS — Z79899 Other long term (current) drug therapy: Secondary | ICD-10-CM

## 2014-02-04 DIAGNOSIS — M1990 Unspecified osteoarthritis, unspecified site: Secondary | ICD-10-CM | POA: Diagnosis present

## 2014-02-04 DIAGNOSIS — K8020 Calculus of gallbladder without cholecystitis without obstruction: Secondary | ICD-10-CM | POA: Diagnosis present

## 2014-02-04 DIAGNOSIS — I5032 Chronic diastolic (congestive) heart failure: Secondary | ICD-10-CM

## 2014-02-04 DIAGNOSIS — I129 Hypertensive chronic kidney disease with stage 1 through stage 4 chronic kidney disease, or unspecified chronic kidney disease: Secondary | ICD-10-CM | POA: Diagnosis present

## 2014-02-04 DIAGNOSIS — N179 Acute kidney failure, unspecified: Secondary | ICD-10-CM | POA: Diagnosis present

## 2014-02-04 DIAGNOSIS — R579 Shock, unspecified: Secondary | ICD-10-CM | POA: Diagnosis present

## 2014-02-04 DIAGNOSIS — E780 Pure hypercholesterolemia: Secondary | ICD-10-CM | POA: Diagnosis present

## 2014-02-04 DIAGNOSIS — Z803 Family history of malignant neoplasm of breast: Secondary | ICD-10-CM | POA: Diagnosis not present

## 2014-02-04 DIAGNOSIS — R1110 Vomiting, unspecified: Secondary | ICD-10-CM

## 2014-02-04 DIAGNOSIS — K7200 Acute and subacute hepatic failure without coma: Secondary | ICD-10-CM | POA: Diagnosis present

## 2014-02-04 DIAGNOSIS — E785 Hyperlipidemia, unspecified: Secondary | ICD-10-CM | POA: Diagnosis present

## 2014-02-04 DIAGNOSIS — R945 Abnormal results of liver function studies: Secondary | ICD-10-CM | POA: Insufficient documentation

## 2014-02-04 DIAGNOSIS — H548 Legal blindness, as defined in USA: Secondary | ICD-10-CM | POA: Diagnosis present

## 2014-02-04 DIAGNOSIS — Z8249 Family history of ischemic heart disease and other diseases of the circulatory system: Secondary | ICD-10-CM | POA: Diagnosis not present

## 2014-02-04 DIAGNOSIS — G8929 Other chronic pain: Secondary | ICD-10-CM | POA: Diagnosis present

## 2014-02-04 DIAGNOSIS — G92 Toxic encephalopathy: Secondary | ICD-10-CM | POA: Diagnosis present

## 2014-02-04 DIAGNOSIS — E119 Type 2 diabetes mellitus without complications: Secondary | ICD-10-CM | POA: Diagnosis present

## 2014-02-04 DIAGNOSIS — I5042 Chronic combined systolic (congestive) and diastolic (congestive) heart failure: Secondary | ICD-10-CM | POA: Diagnosis present

## 2014-02-04 DIAGNOSIS — R7989 Other specified abnormal findings of blood chemistry: Secondary | ICD-10-CM

## 2014-02-04 DIAGNOSIS — K859 Acute pancreatitis without necrosis or infection, unspecified: Secondary | ICD-10-CM | POA: Diagnosis present

## 2014-02-04 DIAGNOSIS — Z888 Allergy status to other drugs, medicaments and biological substances status: Secondary | ICD-10-CM | POA: Diagnosis not present

## 2014-02-04 DIAGNOSIS — I248 Other forms of acute ischemic heart disease: Secondary | ICD-10-CM | POA: Diagnosis present

## 2014-02-04 DIAGNOSIS — K219 Gastro-esophageal reflux disease without esophagitis: Secondary | ICD-10-CM | POA: Diagnosis present

## 2014-02-04 DIAGNOSIS — R1013 Epigastric pain: Secondary | ICD-10-CM

## 2014-02-04 DIAGNOSIS — Z6837 Body mass index (BMI) 37.0-37.9, adult: Secondary | ICD-10-CM | POA: Diagnosis not present

## 2014-02-04 DIAGNOSIS — I4891 Unspecified atrial fibrillation: Secondary | ICD-10-CM | POA: Diagnosis present

## 2014-02-04 DIAGNOSIS — R110 Nausea: Secondary | ICD-10-CM

## 2014-02-04 DIAGNOSIS — E039 Hypothyroidism, unspecified: Secondary | ICD-10-CM | POA: Diagnosis present

## 2014-02-04 LAB — COMPREHENSIVE METABOLIC PANEL
ALBUMIN: 3.5 g/dL (ref 3.5–5.2)
ALK PHOS: 217 U/L — AB (ref 39–117)
ALT: 857 U/L — AB (ref 0–35)
AST: 1916 U/L — AB (ref 0–37)
Anion gap: 11 (ref 5–15)
BUN: 23 mg/dL (ref 6–23)
CHLORIDE: 101 meq/L (ref 96–112)
CO2: 24 mmol/L (ref 19–32)
Calcium: 9 mg/dL (ref 8.4–10.5)
Creatinine, Ser: 1.75 mg/dL — ABNORMAL HIGH (ref 0.50–1.10)
GFR calc non Af Amer: 24 mL/min — ABNORMAL LOW (ref >90)
GFR, EST AFRICAN AMERICAN: 28 mL/min — AB (ref >90)
Glucose, Bld: 231 mg/dL — ABNORMAL HIGH (ref 70–99)
Potassium: 3.5 mmol/L (ref 3.5–5.1)
Sodium: 136 mmol/L (ref 135–145)
Total Bilirubin: 2.4 mg/dL — ABNORMAL HIGH (ref 0.3–1.2)
Total Protein: 6.6 g/dL (ref 6.0–8.3)

## 2014-02-04 LAB — HEPATITIS PANEL, ACUTE
HCV AB: NEGATIVE
Hep A IgM: NONREACTIVE
Hep B C IgM: NONREACTIVE
Hepatitis B Surface Ag: NEGATIVE

## 2014-02-04 LAB — URINALYSIS, ROUTINE W REFLEX MICROSCOPIC
Bilirubin Urine: NEGATIVE
GLUCOSE, UA: NEGATIVE mg/dL
Glucose, UA: 250 mg/dL — AB
HGB URINE DIPSTICK: NEGATIVE
HGB URINE DIPSTICK: NEGATIVE
Ketones, ur: NEGATIVE mg/dL
Ketones, ur: NEGATIVE mg/dL
Leukocytes, UA: NEGATIVE
Nitrite: NEGATIVE
Nitrite: NEGATIVE
Protein, ur: 30 mg/dL — AB
Protein, ur: NEGATIVE mg/dL
Specific Gravity, Urine: 1.02 (ref 1.005–1.030)
Specific Gravity, Urine: 1.026 (ref 1.005–1.030)
UROBILINOGEN UA: 1 mg/dL (ref 0.0–1.0)
Urobilinogen, UA: 1 mg/dL (ref 0.0–1.0)
pH: 7 (ref 5.0–8.0)
pH: 5 (ref 5.0–8.0)

## 2014-02-04 LAB — LIPASE, BLOOD: LIPASE: 159 U/L — AB (ref 11–59)

## 2014-02-04 LAB — CBC WITH DIFFERENTIAL/PLATELET
BASOS PCT: 0 % (ref 0–1)
Basophils Absolute: 0 10*3/uL (ref 0.0–0.1)
EOS ABS: 0 10*3/uL (ref 0.0–0.7)
Eosinophils Relative: 0 % (ref 0–5)
HCT: 39.8 % (ref 36.0–46.0)
HEMOGLOBIN: 12.6 g/dL (ref 12.0–15.0)
Lymphocytes Relative: 2 % — ABNORMAL LOW (ref 12–46)
Lymphs Abs: 0.3 10*3/uL — ABNORMAL LOW (ref 0.7–4.0)
MCH: 29.2 pg (ref 26.0–34.0)
MCHC: 31.7 g/dL (ref 30.0–36.0)
MCV: 92.1 fL (ref 78.0–100.0)
MONOS PCT: 3 % (ref 3–12)
Monocytes Absolute: 0.4 10*3/uL (ref 0.1–1.0)
NEUTROS PCT: 95 % — AB (ref 43–77)
Neutro Abs: 13.6 10*3/uL — ABNORMAL HIGH (ref 1.7–7.7)
Platelets: 133 10*3/uL — ABNORMAL LOW (ref 150–400)
RBC: 4.32 MIL/uL (ref 3.87–5.11)
RDW: 13.7 % (ref 11.5–15.5)
WBC: 14.3 10*3/uL — ABNORMAL HIGH (ref 4.0–10.5)

## 2014-02-04 LAB — MRSA PCR SCREENING: MRSA by PCR: NEGATIVE

## 2014-02-04 LAB — URINE MICROSCOPIC-ADD ON

## 2014-02-04 LAB — TSH: TSH: 2.117 u[IU]/mL (ref 0.350–4.500)

## 2014-02-04 LAB — I-STAT TROPONIN, ED: Troponin i, poc: 0.13 ng/mL (ref 0.00–0.08)

## 2014-02-04 LAB — ACETAMINOPHEN LEVEL: Acetaminophen (Tylenol), Serum: <10 ug/mL — ABNORMAL LOW (ref 10–30)

## 2014-02-04 LAB — TROPONIN I: TROPONIN I: 0.15 ng/mL — AB (ref <0.031)

## 2014-02-04 LAB — PROTIME-INR
INR: 2.9 — ABNORMAL HIGH (ref 0.00–1.49)
PROTHROMBIN TIME: 30.5 s — AB (ref 11.6–15.2)

## 2014-02-04 LAB — PROCALCITONIN: PROCALCITONIN: 54.09 ng/mL

## 2014-02-04 MED ORDER — ONDANSETRON HCL 4 MG/2ML IJ SOLN: 4.0000 mg | Freq: Four times a day (QID) | INTRAMUSCULAR | Status: DC | PRN

## 2014-02-04 MED ORDER — IOHEXOL 300 MG/ML  SOLN
50.0000 mL | Freq: Once | INTRAMUSCULAR | Status: AC | PRN
  Administered 2014-02-04: 50 mL via ORAL

## 2014-02-04 MED ORDER — WARFARIN SODIUM 1 MG PO TABS
1.0000 mg | ORAL_TABLET | Freq: Once | ORAL | Status: DC
  Filled 2014-02-04: qty 1

## 2014-02-04 MED ORDER — SODIUM CHLORIDE 0.9 % IV BOLUS (SEPSIS)
500.0000 mL | Freq: Once | INTRAVENOUS | Status: AC
  Administered 2014-02-04: 500 mL via INTRAVENOUS

## 2014-02-04 MED ORDER — INSULIN ASPART 100 UNIT/ML ~~LOC~~ SOLN
0.0000 [IU] | Freq: Three times a day (TID) | SUBCUTANEOUS | Status: DC
  Administered 2014-02-05 – 2014-02-06 (×2): 1 [IU] via SUBCUTANEOUS
  Administered 2014-02-06 – 2014-02-07 (×3): 2 [IU] via SUBCUTANEOUS

## 2014-02-04 MED ORDER — METRONIDAZOLE IN NACL 5-0.79 MG/ML-% IV SOLN
500.0000 mg | Freq: Three times a day (TID) | INTRAVENOUS | Status: DC
  Administered 2014-02-04 – 2014-02-06 (×5): 500 mg via INTRAVENOUS
  Filled 2014-02-04 (×6): qty 100

## 2014-02-04 MED ORDER — MORPHINE SULFATE 2 MG/ML IJ SOLN
1.0000 mg | INTRAMUSCULAR | Status: DC | PRN
  Administered 2014-02-05 (×4): 1 mg via INTRAVENOUS
  Filled 2014-02-04 (×4): qty 1

## 2014-02-04 MED ORDER — WARFARIN - PHARMACIST DOSING INPATIENT: Freq: Every day | Status: DC

## 2014-02-04 MED ORDER — LEVOTHYROXINE SODIUM 50 MCG PO TABS
50.0000 ug | ORAL_TABLET | Freq: Every day | ORAL | Status: DC
  Administered 2014-02-06 – 2014-02-07 (×2): 50 ug via ORAL
  Filled 2014-02-04 (×2): qty 1

## 2014-02-04 MED ORDER — ASPIRIN 81 MG PO CHEW
81.0000 mg | CHEWABLE_TABLET | Freq: Every day | ORAL | Status: DC
  Administered 2014-02-05 – 2014-02-07 (×3): 81 mg via ORAL
  Filled 2014-02-04 (×3): qty 1

## 2014-02-04 MED ORDER — CIPROFLOXACIN IN D5W 200 MG/100ML IV SOLN
200.0000 mg | Freq: Two times a day (BID) | INTRAVENOUS | Status: DC
  Administered 2014-02-04 – 2014-02-07 (×6): 200 mg via INTRAVENOUS
  Filled 2014-02-04 (×8): qty 100

## 2014-02-04 MED ORDER — ONDANSETRON HCL 4 MG/2ML IJ SOLN: 4.0000 mg | Freq: Once | INTRAMUSCULAR | Status: DC

## 2014-02-04 MED ORDER — SODIUM CHLORIDE 0.9 % IV SOLN
INTRAVENOUS | Status: AC
  Administered 2014-02-04: 19:00:00 via INTRAVENOUS

## 2014-02-04 MED ORDER — TOBRAMYCIN 0.3 % OP SOLN
1.0000 [drp] | Freq: Four times a day (QID) | OPHTHALMIC | Status: DC
  Administered 2014-02-04 – 2014-02-07 (×11): 1 [drp] via OPHTHALMIC
  Filled 2014-02-04: qty 5

## 2014-02-04 MED ORDER — ONDANSETRON HCL 4 MG PO TABS: 4.0000 mg | ORAL_TABLET | Freq: Four times a day (QID) | ORAL | Status: DC | PRN

## 2014-02-04 NOTE — ED Provider Notes (Signed)
CSN: [id number]     Arrival date & time 02/04/14  58 History   First MD Initiated Contact with Patient 02/04/14 1014     Chief Complaint  Patient presents with  . hypotensive      (Consider location/radiation/quality/duration/timing/severity/associated sxs/prior Treatment) HPI Comments: Patient is a 81 year old female with extensive past medical history including coronary artery disease, pacemaker, hypertension, blindness, CHF, diabetes. She presents today with complaints of weakness and low blood pressure. She was found to be hypotensive by EMS who in turn administered IV fluids and removed her nitroglycerin patch. She was brought here for evaluation and remains hypotensive.  She has no specific complaints with the exception of weakness and vomiting. She denies any abdominal pain but does state that she feels some fullness in her abdomen. She is not having any diarrhea or constipation.  The history is provided by the patient.    Past Medical History  Diagnosis Date  . Hypertension   . Coronary artery disease   . Legally blind   . Chronic kidney disease   . Anemia   . Hypothyroid   . Gastric ulcer   . Hiatal hernia   . Dyspepsia   . UTI (lower urinary tract infection)   . H/O: GI bleed 12//13  . Diastolic dysfunction   . Pacemaker   . High cholesterol   . CHF (congestive heart failure)   . Anginal pain   . MI (myocardial infarction) ? 1994  . DVT of upper extremity (deep vein thrombosis) 06/13/2012    BUE  . Seasonal allergies   . Allergy to perfume     "strong perfumes only" (06/13/2012)  . Shortness of breath     "can happen at any time" (06/13/2012)  . Type II diabetes mellitus   . History of blood transfusion 2013    "not related to OR; BP had dropped" (06/13/2012)  . GERD (gastroesophageal reflux disease)   . Daily headache   . Arthritis     "all over" (06/13/2012)  . A-fib   . 2Nd degree atrioventricular block   . Abnormal nuclear stress test, 08/2013 09/17/2013    Past Surgical History  Procedure Laterality Date  . Esophagogastroduodenoscopy  12/22/2011    Procedure: ESOPHAGOGASTRODUODENOSCOPY (EGD);  Surgeon: Gatha Mayer, MD;  Location: [facility];  Service: Endoscopy;  Laterality: N/A;  . Cardioversion  06/06/06    successful  . Insert / replace / remove pacemaker  06/29/2006    Medtronic adapta  . Cardiac catheterization  10/25/92  . Cardiac catheterization  11/18/03    w/grafts 100%CX LAD 80 & 100%  . Cardiac catheterization  01/24/05    diffuse disease of native vessels  . Cardiac catheterization  06/06/06    severe native CAD  . Coronary artery bypass graft  10/27/92    LIMA to LAD,SVG to LAD second diagonal,obtuse maraginal of the CX and posterior descendingbranch of the RCA  . Cataract extraction w/ intraocular lens  implant, bilateral Bilateral   . Refractive surgery Bilateral    Family History  Problem Relation Age of Onset  . Breast cancer Daughter   . Diabetes Son   . Diabetes Daughter   . Heart disease Father   . Diabetes Father   . Colon polyps Daughter   . Heart attack Brother   . Diabetes Brother   . Stroke Sister    History  Substance Use Topics  . Smoking status: Never Smoker   . Smokeless tobacco: Never Used  . Alcohol Use:  No   OB History    No data available     Review of Systems  All other systems reviewed and are negative.     Allergies  Darvon; Digoxin and related; Penicillins; Percocet; Percodan; and Vicodin  Home Medications   Prior to Admission medications   Medication Sig Start Date End Date Taking? Authorizing Provider  ACCU-CHEK AVIVA PLUS test strip Use to check blood sugars 2 times daily 01/19/14   Elayne Snare, MD  acetaminophen (TYLENOL) 500 MG tablet Take 500-1,000 mg by mouth 2 (two) times daily as needed for mild pain.    Historical Provider, MD  acetaminophen-codeine (TYLENOL #3) 300-30 MG per tablet Take 1 tablet by mouth 2 (two) times daily as needed for pain.    Historical  Provider, MD  aspirin 81 MG chewable tablet Chew 81 mg by mouth daily.    Historical Provider, MD  atropine 1 % ophthalmic solution daily. 10/29/13   Historical Provider, MD  Blood Glucose Monitoring Suppl (ACCU-CHEK AVIVA PLUS) W/DEVICE KIT Use to check blood sugar 2 times per day dx code 250.00 07/30/13   Elayne Snare, MD  cholecalciferol (VITAMIN D) 1000 UNITS tablet Take 1,000 Units by mouth daily.    Historical Provider, MD  feeding supplement, GLUCERNA SHAKE, (GLUCERNA SHAKE) LIQD Take 237 mLs by mouth 2 (two) times daily between meals.    Historical Provider, MD  folic acid (FOLVITE) 1 MG tablet Take 1 tablet (1 mg total) by mouth daily. 01/19/14   Mihai Croitoru, MD  furosemide (LASIX) 40 MG tablet Take 1 tablet daily x 3 days then as directed Patient taking differently: 40 mg every other day. Take 1 tablet daily x 3 days then as directed 08/20/13   Isaiah Serge, NP  gabapentin (NEURONTIN) 100 MG capsule Take 100 mg by mouth 2 (two) times daily.     Historical Provider, MD  glipiZIDE (GLUCOTROL) 5 MG tablet Take one-half tablet by  mouth daily only if blood  sugar is over 200 01/19/14   Elayne Snare, MD  guaiFENesin (MUCINEX) 600 MG 12 hr tablet Take 600 mg by mouth 2 (two) times daily.    Historical Provider, MD  hydrocortisone (ANUSOL-HC) 25 MG suppository Use 1 suppository rectally at bedtime for 7 days. 04/01/13   Amy S Esterwood, PA-C  Hyoscyamine Sulfate 0.375 MG TBCR A1 tab twice a day as needed, abdominal pain 06/30/13   Inda Castle, MD  Lancets (ACCU-CHEK MULTICLIX) lancets Use as instructed to check blood sugar 2 times per day dx code 250.00 07/30/13   Elayne Snare, MD  Lancets Misc. (ACCU-CHEK FASTCLIX LANCET) KIT Use as directec 03/17/13   Elayne Snare, MD  levothyroxine (SYNTHROID, LEVOTHROID) 50 MCG tablet Take 1 tablet (50 mcg total) by mouth daily before breakfast. 01/05/14   Elayne Snare, MD  Linaclotide (LINZESS) 145 MCG CAPS capsule Take 1 capsule (145 mcg total) by mouth daily. 07/07/13    Inda Castle, MD  loratadine (CLARITIN) 10 MG tablet Take 10 mg by mouth daily.    Historical Provider, MD  metoprolol tartrate (LOPRESSOR) 25 MG tablet Take 1 tablet (25 mg total) by mouth 3 (three) times daily. 10/31/13   Burtis Junes, NP  mupirocin ointment (BACTROBAN) 2 %  03/21/13   Historical Provider, MD  neomycin-bacitracin-polymyxin (NEOSPORIN) [phone] ointment Place 1 application into the left ear as needed (for inflammation behind the ear).    Historical Provider, MD  nitroGLYCERIN (NITRODUR - DOSED IN MG/24 HR) 0.2 mg/hr patch daily.  08/22/13   Historical Provider, MD  nitroGLYCERIN (NITRODUR - DOSED IN MG/24 HR) 0.4 mg/hr patch Place 1 patch (0.4 mg total) onto the skin daily. 10/17/13   Mihai Croitoru, MD  omeprazole (PRILOSEC) 20 MG capsule Take 1 capsule (20 mg total) by mouth daily. 06/04/13   Mihai Croitoru, MD  PATADAY 0.2 % SOLN Place 1 drop into both eyes as needed.  04/25/13   Historical Provider, MD  potassium chloride SA (K-DUR,KLOR-CON) 20 MEQ tablet take 1 tablet by mouth once daily for 3 days  WITH FUROSEMIDE THEN AS DIRECTED 12/29/13   Mihai Croitoru, MD  rosuvastatin (CRESTOR) 10 MG tablet Take 10 mg by mouth at bedtime.     Historical Provider, MD  Thymol CRYS Apply 1 application topically 2 (two) times daily. Used for nails    Historical Provider, MD  tobramycin (TOBREX) 0.3 % ophthalmic solution Place 1 drop into both eyes daily as needed.  10/18/12   Historical Provider, MD  tobramycin-dexamethasone [facility]) ophthalmic solution 4 (four) times daily. 10/29/13   Historical Provider, MD  Vitamin D, Ergocalciferol, (DRISDOL) 50000 UNITS CAPS capsule Take 50,000 Units by mouth daily.     Historical Provider, MD  warfarin (COUMADIN) 2.5 MG tablet Take 1 to 1.5 tablets by mouth daily as directed by coumadin clinic 08/22/13   Tommy Medal, RPH-CPP  zolpidem (AMBIEN) 10 MG tablet Take 5 mg by mouth at bedtime as needed. sleep    Historical Provider, MD   BP 73/36 mmHg   Pulse 79  Temp(Src) 98.2 F (36.8 C) (Oral)  Resp 20  SpO2 95% Physical Exam  Constitutional: She is oriented to person, place, and time. She appears well-developed and well-nourished. No distress.  HENT:  Head: Normocephalic and atraumatic.  Mouth/Throat: Oropharynx is clear and moist.  Neck: Normal range of motion. Neck supple.  Cardiovascular: Normal rate and regular rhythm.  Exam reveals no gallop and no friction rub.   No murmur heard. Pulmonary/Chest: Effort normal and breath sounds normal. No respiratory distress. She has no wheezes.  Abdominal: Soft. Bowel sounds are normal. She exhibits no distension. There is no tenderness.  Musculoskeletal: Normal range of motion. She exhibits no edema.  Lymphadenopathy:    She has no cervical adenopathy.  Neurological: She is alert and oriented to person, place, and time. No cranial nerve deficit. Coordination normal.  Skin: Skin is warm and dry. She is not diaphoretic.  Nursing note and vitals reviewed.   ED Course  Procedures (including critical care time) Labs Review Labs Reviewed  COMPREHENSIVE METABOLIC PANEL  CBC WITH DIFFERENTIAL  LIPASE, BLOOD  I-STAT TROPOININ, ED    Imaging Review No results found.   Date: 02/04/2014  Rate: 79  Rhythm: Ventr Paced  QRS Axis: indeterminate  Intervals: indeterminate  ST/T Wave abnormalities: indeterminate  Conduction Disutrbances:Indeterminate  Narrative Interpretation:   Old EKG Reviewed: unchanged    MDM   Final diagnoses:  Vomiting    Patient with extensive pmh presents with decreased responsiveness and near syncope.  She is hyoptensive on arrival and workup reveals marked elevation of her lfts.  She was given fluids cautiously due to her chf, however blood pressures have remained soft.  She will be admitted to the hospitalist service for further workup.  I have also spoken with midlevel from [place] who will evaluate the patient as well.  CRITICAL CARE Performed by:  Veryl Speak Total critical care time: 30 minutes Critical care time was exclusive of separately billable procedures and treating other  patients. Critical care was necessary to treat or prevent imminent or life-threatening deterioration. Critical care was time spent personally by me on the following activities: development of treatment plan with patient and/or surrogate as well as nursing, discussions with consultants, evaluation of patient's response to treatment, examination of patient, obtaining history from patient or surrogate, ordering and performing treatments and interventions, ordering and review of laboratory studies, ordering and review of radiographic studies, pulse oximetry and re-evaluation of patient's condition.     Veryl Speak, MD 02/06/14 [phone]

## 2014-02-04 NOTE — Consult Note (Signed)
Referring Provider:  [facility] Primary Care Physician:  Kevan Ny, MD Primary Gastroenterologist:  Dr. Deatra Ina  Reason for Consultation:  Abnormal LFTs  HPI: [name] is a 81 y.o. female is a 81 yo female with a past medical history of CAD, CHF with EF of 40-45%,ms/p pacemaker placement for heart block, hx a-fib and hypertension.  She has had a DVT and is maintained on coumadin.She is known to Dr Deatra Ina for chronic constipation for many years.  She lives at home and her children care for her. Tis moring, a neighbor who helps care for her was bathing the patient and noticed the patient was very sweaty. She took patients temp and it was 99. She took patients BP and systolic was in the 75F. She was brought to ER via EMS, where she was also noted to be hypotensive and was given fluids.  Patients daughter says patient ate barbecued ribs last night and about an hour or so later developed abdominal pain and vomited. She felt better after she vomited, but today patient says she has some pain and points to the epigastric area. She feels weak and has waves of nausea but has not vomited today. She says her abdomen feels very full.She last had a BM 4 or 5 days ago per daughter.Pt denies CP or SOB. In ER, pt was noted to have elevated LFTs. Pt has not had any new meds.   Past Medical History  Diagnosis Date  . Hypertension   . Coronary artery disease   . Legally blind   . Chronic kidney disease   . Anemia   . Hypothyroid   . Gastric ulcer   . Hiatal hernia   . Dyspepsia   . UTI (lower urinary tract infection)   . H/O: GI bleed 12//13  . Diastolic dysfunction   . Pacemaker   . High cholesterol   . CHF (congestive heart failure)   . Anginal pain   . MI (myocardial infarction) ? 1994  . DVT of upper extremity (deep vein thrombosis) 06/13/2012    BUE  . Seasonal allergies   . Allergy to perfume     "strong perfumes only" (06/13/2012)  . Shortness of breath     "can happen at  any time" (06/13/2012)  . Type II diabetes mellitus   . History of blood transfusion 2013    "not related to OR; BP had dropped" (06/13/2012)  . GERD (gastroesophageal reflux disease)   . Daily headache   . Arthritis     "all over" (06/13/2012)  . A-fib   . 2Nd degree atrioventricular block   . Abnormal nuclear stress test, 08/2013 09/17/2013    Past Surgical History  Procedure Laterality Date  . Esophagogastroduodenoscopy  12/22/2011    Procedure: ESOPHAGOGASTRODUODENOSCOPY (EGD);  Surgeon: Gatha Mayer, MD;  Location: [facility];  Service: Endoscopy;  Laterality: N/A;  . Cardioversion  06/06/06    successful  . Insert / replace / remove pacemaker  06/29/2006    [employer]tronic adapta  . Cardiac catheterization  10/25/92  . Cardiac catheterization  11/18/03    w/grafts 100%CX LAD 80 & 100%  . Cardiac catheterization  01/24/05    diffuse disease of native vessels  . Cardiac catheterization  06/06/06    severe native CAD  . Coronary artery bypass graft  10/27/92    LIMA to LAD,SVG to LAD second diagonal,obtuse maraginal of the CX and posterior descendingbranch of the RCA  . Cataract extraction w/ intraocular lens  implant, bilateral Bilateral   . Refractive surgery Bilateral     Prior to Admission medications   Medication Sig Start Date End Date Taking? Authorizing Provider  acetaminophen (TYLENOL) 500 MG tablet Take 500-1,000 mg by mouth 2 (two) times daily as needed for mild pain.   Yes Historical Provider, MD  acetaminophen-codeine (TYLENOL #3) 300-30 MG per tablet Take 1 tablet by mouth 2 (two) times daily as needed for pain.   Yes Historical Provider, MD  aspirin 81 MG chewable tablet Chew 81 mg by mouth daily.   Yes Historical Provider, MD  atropine 1 % ophthalmic solution Place 1 drop into the right eye 4 (four) times daily.  10/29/13  Yes Historical Provider, MD  cholecalciferol (VITAMIN D) 1000 UNITS tablet Take 1,000 Units by mouth daily.   Yes Historical Provider, MD  feeding  supplement, GLUCERNA SHAKE, (GLUCERNA SHAKE) LIQD Take 237 mLs by mouth 2 (two) times daily between meals.   Yes Historical Provider, MD  folic acid (FOLVITE) 1 MG tablet Take 1 tablet (1 mg total) by mouth daily. 01/19/14  Yes Mihai Croitoru, MD  furosemide (LASIX) 40 MG tablet Take 1 tablet daily x 3 days then as directed Patient taking differently: 40 mg every other day. Take 1 tablet daily x 3 days then as directed 08/20/13  Yes Leone Brand, NP  gabapentin (NEURONTIN) 100 MG capsule Take 100 mg by mouth 2 (two) times daily.    Yes Historical Provider, MD  glipiZIDE (GLUCOTROL) 5 MG tablet Take one-half tablet by  mouth daily only if blood  sugar is over 200 01/19/14  Yes Reather Littler, MD  guaiFENesin (MUCINEX) 600 MG 12 hr tablet Take 600 mg by mouth 2 (two) times daily.   Yes Historical Provider, MD  Hyoscyamine Sulfate 0.375 MG TBCR A1 tab twice a day as needed, abdominal pain 06/30/13  Yes Louis Meckel, MD  levothyroxine (SYNTHROID, LEVOTHROID) 50 MCG tablet Take 1 tablet (50 mcg total) by mouth daily before breakfast. 01/05/14  Yes Reather Littler, MD  Linaclotide [facility]) 145 MCG CAPS capsule Take 1 capsule (145 mcg total) by mouth daily. 07/07/13  Yes Louis Meckel, MD  loratadine (CLARITIN) 10 MG tablet Take 10 mg by mouth daily.   Yes Historical Provider, MD  metoprolol tartrate (LOPRESSOR) 25 MG tablet Take 1 tablet (25 mg total) by mouth 3 (three) times daily. 10/31/13  Yes Rosalio Macadamia, NP  nitroGLYCERIN (NITRODUR - DOSED IN MG/24 HR) 0.4 mg/hr patch Place 1 patch (0.4 mg total) onto the skin daily. 10/17/13  Yes Mihai Croitoru, MD  omeprazole (PRILOSEC) 20 MG capsule Take 1 capsule (20 mg total) by mouth daily. 06/04/13  Yes Mihai Croitoru, MD  Polyvinyl Alcohol-Povidone (REFRESH OP) Apply 1 drop to eye daily.   Yes Historical Provider, MD  potassium chloride SA (K-DUR,KLOR-CON) 20 MEQ tablet Take 20 mEq by mouth every other day.   Yes Historical Provider, MD  rosuvastatin (CRESTOR) 10 MG  tablet Take 10 mg by mouth at bedtime.    Yes Historical Provider, MD  tobramycin (TOBREX) 0.3 % ophthalmic solution Place 1 drop into the right eye daily as needed.  10/18/12  Yes Historical Provider, MD  Vitamin D, Ergocalciferol, (DRISDOL) 50000 UNITS CAPS capsule Take 50,000 Units by mouth daily.    Yes Historical Provider, MD  warfarin (COUMADIN) 2.5 MG tablet Take 1 to 1.5 tablets by mouth daily as directed by coumadin clinic Patient taking differently: Take 2.5-3.75 mg by mouth daily. Takes 1  tablet on Wednesdays and Sundays. 1.5 tablets on all other days 08/22/13  Yes Phillips Hay, RPH-CPP  zolpidem (AMBIEN) 10 MG tablet Take 5 mg by mouth at bedtime as needed. sleep   Yes Historical Provider, MD  ACCU-CHEK AVIVA PLUS test strip Use to check blood sugars 2 times daily 01/19/14   Reather Littler, MD  Blood Glucose Monitoring Suppl (ACCU-CHEK AVIVA PLUS) W/DEVICE KIT Use to check blood sugar 2 times per day dx code 250.00 07/30/13   Reather Littler, MD  hydrocortisone (ANUSOL-HC) 25 MG suppository Use 1 suppository rectally at bedtime for 7 days. Patient not taking: Reported on 02/04/2014 04/01/13   Amy S Esterwood, PA-C  Lancets (ACCU-CHEK MULTICLIX) lancets Use as instructed to check blood sugar 2 times per day dx code 250.00 07/30/13   Reather Littler, MD  Lancets Misc. (ACCU-CHEK FASTCLIX LANCET) KIT Use as directec 03/17/13   Reather Littler, MD  potassium chloride SA (K-DUR,KLOR-CON) 20 MEQ tablet take 1 tablet by mouth once daily for 3 days  WITH FUROSEMIDE THEN AS DIRECTED Patient not taking: Reported on 02/04/2014 12/29/13   Thurmon Fair, MD    Current Facility-Administered Medications  Medication Dose Route Frequency Provider Last Rate Last Dose  . ondansetron (ZOFRAN) injection 4 mg  4 mg Intravenous Once Geoffery Lyons, MD   Stopped at 02/04/14 1157   Current Outpatient Prescriptions  Medication Sig Dispense Refill  . acetaminophen (TYLENOL) 500 MG tablet Take 500-1,000 mg by mouth 2 (two) times daily as  needed for mild pain.    Marland Kitchen acetaminophen-codeine (TYLENOL #3) 300-30 MG per tablet Take 1 tablet by mouth 2 (two) times daily as needed for pain.    Marland Kitchen aspirin 81 MG chewable tablet Chew 81 mg by mouth daily.    Marland Kitchen atropine 1 % ophthalmic solution Place 1 drop into the right eye 4 (four) times daily.   0  . cholecalciferol (VITAMIN D) 1000 UNITS tablet Take 1,000 Units by mouth daily.    . feeding supplement, GLUCERNA SHAKE, (GLUCERNA SHAKE) LIQD Take 237 mLs by mouth 2 (two) times daily between meals.    . folic acid (FOLVITE) 1 MG tablet Take 1 tablet (1 mg total) by mouth daily. 90 tablet 3  . furosemide (LASIX) 40 MG tablet Take 1 tablet daily x 3 days then as directed (Patient taking differently: 40 mg every other day. Take 1 tablet daily x 3 days then as directed) 30 tablet 6  . gabapentin (NEURONTIN) 100 MG capsule Take 100 mg by mouth 2 (two) times daily.     Marland Kitchen glipiZIDE (GLUCOTROL) 5 MG tablet Take one-half tablet by  mouth daily only if blood  sugar is over 200 45 tablet 1  . guaiFENesin (MUCINEX) 600 MG 12 hr tablet Take 600 mg by mouth 2 (two) times daily.    Marland Kitchen Hyoscyamine Sulfate 0.375 MG TBCR A1 tab twice a day as needed, abdominal pain 25 tablet 1  . levothyroxine (SYNTHROID, LEVOTHROID) 50 MCG tablet Take 1 tablet (50 mcg total) by mouth daily before breakfast. 30 tablet 5  . Linaclotide (LINZESS) 145 MCG CAPS capsule Take 1 capsule (145 mcg total) by mouth daily. 30 capsule 3  . loratadine (CLARITIN) 10 MG tablet Take 10 mg by mouth daily.    . metoprolol tartrate (LOPRESSOR) 25 MG tablet Take 1 tablet (25 mg total) by mouth 3 (three) times daily. 90 tablet 6  . nitroGLYCERIN (NITRODUR - DOSED IN MG/24 HR) 0.4 mg/hr patch Place 1 patch (0.4 mg total)  onto the skin daily. 90 patch 3  . omeprazole (PRILOSEC) 20 MG capsule Take 1 capsule (20 mg total) by mouth daily. 90 capsule 3  . Polyvinyl Alcohol-Povidone (REFRESH OP) Apply 1 drop to eye daily.    . potassium chloride SA  (K-DUR,KLOR-CON) 20 MEQ tablet Take 20 mEq by mouth every other day.    . rosuvastatin (CRESTOR) 10 MG tablet Take 10 mg by mouth at bedtime.     Marland Kitchen tobramycin (TOBREX) 0.3 % ophthalmic solution Place 1 drop into the right eye daily as needed.     . Vitamin D, Ergocalciferol, (DRISDOL) 50000 UNITS CAPS capsule Take 50,000 Units by mouth daily.     Marland Kitchen warfarin (COUMADIN) 2.5 MG tablet Take 1 to 1.5 tablets by mouth daily as directed by coumadin clinic (Patient taking differently: Take 2.5-3.75 mg by mouth daily. Takes 1 tablet on Wednesdays and Sundays. 1.5 tablets on all other days) 40 tablet 2  . zolpidem (AMBIEN) 10 MG tablet Take 5 mg by mouth at bedtime as needed. sleep    . ACCU-CHEK AVIVA PLUS test strip Use to check blood sugars 2 times daily 200 each 1  . Blood Glucose Monitoring Suppl (ACCU-CHEK AVIVA PLUS) W/DEVICE KIT Use to check blood sugar 2 times per day dx code 250.00 1 kit 0  . hydrocortisone (ANUSOL-HC) 25 MG suppository Use 1 suppository rectally at bedtime for 7 days. (Patient not taking: Reported on 02/04/2014) 7 suppository 1  . Lancets (ACCU-CHEK MULTICLIX) lancets Use as instructed to check blood sugar 2 times per day dx code 250.00 200 each 1  . Lancets Misc. (ACCU-CHEK FASTCLIX LANCET) KIT Use as directec 1 kit 1  . potassium chloride SA (K-DUR,KLOR-CON) 20 MEQ tablet take 1 tablet by mouth once daily for 3 days  WITH FUROSEMIDE THEN AS DIRECTED (Patient not taking: Reported on 02/04/2014) 30 tablet 2    Allergies as of 02/04/2014 - Review Complete 02/04/2014  Allergen Reaction Noted  . Darvon  03/14/2011  . Digoxin and related Nausea Only 04/22/2012  . Penicillins Other (See Comments) 12/17/2011  . Percocet [oxycodone-acetaminophen] Other (See Comments) 03/14/2011  . Percodan [oxycodone-aspirin]  09/12/2011  . Vicodin [hydrocodone-acetaminophen] Other (See Comments) 12/18/2011    Family History  Problem Relation Age of Onset  . Breast cancer Daughter   . Diabetes Son    . Diabetes Daughter   . Heart disease Father   . Diabetes Father   . Colon polyps Daughter   . Heart attack Brother   . Diabetes Brother   . Stroke Sister     History   Social History  . Marital Status: Married    Spouse Name: N/A    Number of Children: 78  . Years of Education: N/A   Occupational History  .     Social History Main Topics  . Smoking status: Never Smoker   . Smokeless tobacco: Never Used  . Alcohol Use: No  . Drug Use: No  . Sexual Activity: No   Other Topics Concern  . Not on file   Social History Narrative    Review of Systems: Gen: Feels weak and feverish CV: Denies chest pain, angina, palpitations, syncope, orthopnea, PND, peripheral edema, and claudication. Resp: Denies dyspnea at rest, dyspnea with exercise, cough, sputum, wheezing, coughing up blood, and pleurisy. GI: Denies vomiting blood, jaundice, and fecal incontinence.   Denies dysphagia or odynophagia. GU : Denies urinary burning, blood in urine, urinary frequency, urinary hesitancy, nocturnal urination, and urinary incontinence. MS:  Denies joint pain, limitation of movement, and swelling, stiffness, low back pain, extremity pain. Denies muscle weakness, cramps, atrophy.  Derm: Denies rash, itching, dry skin, hives, moles, warts, or unhealing ulcers.  Psych: Denies depression, anxiety, memory loss, suicidal ideation, hallucinations, paranoia, and confusion. Heme: Denies bruising, bleeding, and enlarged lymph nodes. Neuro:  Denies any headaches, dizziness, paresthesias. Endo:  Denies any problems with DM, thyroid, adrenal function.  Physical Exam: Vital signs in last 24 hours: Temp:  [98.2 F (36.8 C)] 98.2 F (36.8 C) (01/20 1011) Pulse Rate:  [79-86] 83 (01/20 1400) Resp:  [13-20] 13 (01/20 1400) BP: (65-103)/(36-74) 83/49 mmHg (01/20 1400) SpO2:  [95 %-99 %] 99 % (01/20 1400)   General:   Alert,  Well-developed, well-nourished, pleasant and cooperative in NAD Head:   Normocephalic and atraumatic. Eyes:  Sclera clear, no icterus.  Conjunctiva pink.Pt is blind Ears:  No external abnormality, pt is hard of hearing Nose:  No deformity, discharge,  or lesions. Mouth:  No deformity or lesions.   Neck:  Supple; no masses or thyromegaly. Lungs:  Clear throughout to auscultation.   No wheezes, crackles, or rhonchi.  Heart:  Regular rate and rhythm; no murmurs, clicks, rubs,  or gallops. Abdomen:  Soft,mild epigastric and lower abdominal pain, no rebound or guarding, BS active,nonpalp mass or hsm.   Rectal:  Deferred per pt Msk:  Symmetrical without gross deformities. . Extremities:  Without clubbing or edema. Neurologic:  Alert and  oriented x4;  grossly normal neurologically. Skin:  Intact without significant lesions or rashes.. Psych:  Alert and cooperative. Normal mood and affect.    Lab Results:  Recent Labs  02/04/14 1052  WBC 14.3*  HGB 12.6  HCT 39.8  PLT 133*   BMET  Recent Labs  02/04/14 1052  NA 136  K 3.5  CL 101  CO2 24  GLUCOSE 231*  BUN 23  CREATININE 1.75*  CALCIUM 9.0   LFT  Recent Labs  02/04/14 1052  PROT 6.6  ALBUMIN 3.5  AST 1916*  ALT 857*  ALKPHOS 217*  BILITOT 2.4*   Lipase  159   Studies/Results: Ct Abdomen Pelvis Wo Contrast  02/04/2014   CLINICAL DATA:  Weakness, abdominal pain and elevated LFTs  EXAM: CT ABDOMEN AND PELVIS WITHOUT CONTRAST  TECHNIQUE: Multidetector CT imaging of the abdomen and pelvis was performed following the standard protocol without IV contrast.  COMPARISON:  11/18/2012  FINDINGS: Lung bases are well aerated bilaterally. Minimal right basilar atelectasis is seen.  The liver is within normal limits. The gallbladder is well distended. The spleen, adrenal glands and pancreas are within normal limits. The kidneys are well visualized bilaterally with mild perinephric stranding similar to that seen on prior CT examination. Some hypodensities are noted consistent with renal cysts also  stable from the prior exam. No calculi or obstructive changes are seen. Aortoiliac calcifications are noted. The appendix is not well visualized although no inflammatory changes are seen. Diffuse aortoiliac calcifications are seen. The bladder is partially decompressed. No pelvic mass lesion is seen. No acute bony abnormality is noted. Degenerative anterolisthesis of L3 on L4 and L4 on L5 is noted.  IMPRESSION: Chronic changes as described above without acute abnormality.   Electronically Signed   By: Inez Catalina M.D.   On: 02/04/2014 14:45   Dg Abd Acute W/chest  02/04/2014   CLINICAL DATA:  Nausea and vomiting.  Fever.  EXAM: ACUTE ABDOMEN SERIES (ABDOMEN 2 VIEW & CHEST 1 VIEW)  COMPARISON:  Chest radiograph 10/20/2013.  FINDINGS: Frontal view of the chest shows midline trachea. Heart is enlarged. Pacemaker lead tips project over the right atrium and right ventricle. Lungs are somewhat low in volume but grossly clear. No definite pleural fluid.  Views of the abdomen show stool in the majority of the colon. There are scattered air-fluid levels. Gas is seen in the rectum. No definite small bowel dilatation. No free air.  IMPRESSION: 1. Bowel gas pattern is indicative of constipation. 2. No acute findings in the chest.   Electronically Signed   By: Lorin Picket M.D.   On: 02/04/2014 11:43   Abdomen Pelvis W Contrast   Status: Final result       PACS Images     Show images for CT Abdomen Pelvis W Contrast     Study Result     CLINICAL DATA: Generalized weakness and fever. Productive cough.  EXAM: CT ABDOMEN AND PELVIS WITH CONTRAST  TECHNIQUE: Multidetector CT imaging of the abdomen and pelvis was performed using the standard protocol following bolus administration of intravenous contrast.  CONTRAST: 37mL OMNIPAQUE IOHEXOL 300 MG/ML SOLN  COMPARISON: 12/17/2011  FINDINGS: Images which include the lower chest show cardiomegaly the without pericardial or pleural effusion  visible.  No focal abnormality is seen in the liver or spleen. A tiny hiatal hernia is noted. Stomach otherwise unremarkable. Duodenum, pancreas, and adrenal glands have normal imaging features. The gallbladder is distended. The tiny stones seen on the previous study are less evident today, but may be visible on image 25.  Kidneys show cortical thinning bilaterally. 15 mm water density lesion in the interpolar region of the left kidney was present previously and is compatible with a cyst. Bilateral perinephric stranding is unchanged and can be nonacute.  No abdominal aortic aneurysm. Scattered small retroperitoneal lymph nodes are evident. The portal vein and superior mesenteric vein are patent. No evidence for bowel dilatation.  Imaging through the pelvis shows no free intraperitoneal fluid. There is no pelvic sidewall lymphadenopathy. Bladder is moderately distended. Uterus is unremarkable. There is no adnexal mass.  No substantial diverticular disease in the colon. No evidence for colonic diverticulitis. Terminal ileum is unremarkable. The appendix is not visualized, but there is no edema or inflammation in the region of the cecum.  Bone windows reveal no worrisome lytic or sclerotic osseous lesions.  IMPRESSION: No acute findings in the abdomen or pelvis. No evidence to explain the patient's history of weakness and fever. Specifically, no evidence for colonic diverticulitis.   Electronically Signed  By: Misty Stanley M.D.  On: 11/18/2012 17:23   *RADIOLOGY REPORT*  Clinical Data: Severe nausea and vomiting. Abdominal pain.  CT ABDOMEN AND PELVIS WITHOUT CONTRAST  Technique: Multidetector CT imaging of the abdomen and pelvis was performed following the standard protocol without intravenous contrast.  Comparison: 06/16/2011  Findings: Probable tiny calcified gallstones again noted, however there is no evidence of cholecystitis or biliary ductal  dilatation. Noncontrast images of the liver, pancreas, spleen, adrenal glands, and kidneys are unremarkable in appearance. Chronic bilateral perinephric stranding again demonstrated as well as probable tiny fluid attenuation left renal cyst. No evidence of renal calculi or hydronephrosis.  No soft tissue masses or lymphadenopathy identified within the abdomen or pelvis. Uterus and adnexal regions are unremarkable. No evidence of inflammatory process or abnormal fluid collections. No evidence of dilated bowel loops or hernia. Cardiomegaly and diffuse coronary artery calcifications again demonstrated, and pacemaker leads again noted in the right heart.  IMPRESSION:  1. Stable  exam. No acute findings. 2. Cholelithiasis, without evidence of acute cholecystitis.   IMPRESSION/PLAN: 81 yo female admitted with epigastric pain and an episode of vomiting, found to have abnl LFTs. Pt was hypotensive on arrival to ER and is receiving fluid resuscitation.  Pt appears to have a subacute hepatitis --? Toxin, infectious, ischemic. Will check  Hepatitis panel and acetominophen level. Follow LFTs and lipase. Will check abd ultrasound with doppler.    Hvozdovic, Deloris Ping 02/04/2014,  Pager [phone]  GI ATTENDING  History, laboratories, x-rays reviewed. Case reviewed with advanced practitioner. Agree with H&P as outlined above. Elderly female admitted with symptomatic hypotension. Noted to have mild leukocytosis as well as markedly elevated liver tests. Some transient abdominal discomfort. Imaging study does not reveal either gallstones or dilated biliary system. The pancreas is normal. Workup of liver test abnormalities to include ruling out vascular etiology, toxins, and infectious hepatitis. Recommend ultrasound to evaluate for stones, Doppler ultrasound to rule out hepatic vein thrombosis, acute hepatitis serologies, and acetaminophen level. Review of the medications as well as. Okay with  empiric antibiotic at this point, until clinical picture more clear.  Docia Chuck. Geri Seminole., M.D. [facility] Division of Gastroenterology

## 2014-02-04 NOTE — ED Notes (Addendum)
Patient comes from home with c/o lethargy.  Patient had some abdominal pain earlier today and took a Tylenol 3 to treat with relief.  Patient's last BM was last Wednesday by way of a laxative.  Patient's lungs sounds are clear and diminished.  Patient has +1 radial and dorsalis pedis pulses.  No peripheral edema noted.  Patient's abdomen is somewhat distended and bowel sounds are hypoactive.  Patient denies abdominal pain currently and N/V/D and fever.

## 2014-02-04 NOTE — Progress Notes (Signed)
  CARE MANAGEMENT ED NOTE 02/04/2014  Patient:  [name], [name]   Account Number:  [id number]  Date Initiated:  02/04/2014  Documentation initiated by:  Livia Snellen  Subjective/Objective Assessment:   Patient presents to Ed with weakness, hypotension     Subjective/Objective Assessment Detail:   Patient with pmhx of CAD, CHF, pacemaker, Afib on coumadin    WBC 14.3, AST 1916, ALT 857 troponin .15    Blood pressure in ED as low as 65/39     Action/Plan:   Action/Plan Detail:   Anticipated DC Date:       Status Recommendation to Physician:   Result of Recommendation:    Other ED [place]  Other  PCP issues    Choice offered to / List presented to:  C-4 Adult Children          Status of service:  Completed, signed off  ED Comments:   ED Comments Detail:  EDCM spoke to patient's daughter at bedside [name], patient asleep.  Patient's daughter reports she and her brother saty with the patient at home.  Per daughter, patient receives 24/7 care.  Patient's daughter reports patient has a hospital bed, walker (which she uses with assistance) at home.  Patient's daughter reports she would like the patient to have a BSC for the bathroom, and a new wheelchair.  The arms are broken on the wheelchair she has now.  Patient's daughter reports that patient has had home health services with [facility] in the past and would like [facility] again on discharge.  Patient's daughter is requesting CNA [name] and [name] for PT. Patient's daughter confirms patient's pcp is Dr. Marlou Sa and has an appointment to see pcp on 02/26.  Patient's daughter refused list of private duty nursing agencies.  EDCM provided patient's daughter with list of home health agencies in [facility] highlighting [facility]. Patient's daughter thankful for resources.  No further EDCM needs at this time.

## 2014-02-04 NOTE — ED Notes (Signed)
Per EMS, called by family r/t patient with temp and diabetic-patient states she feels weak-vomited once last night-was in pain and given tylenol 3 early this am-upon arrival patient was hypotensive-had nitro patch on, BP 54/30-IV established and given fluids-BP now 96/37-patient has pacemaker

## 2014-02-04 NOTE — Progress Notes (Signed)
ANTICOAGULATION CONSULT NOTE - Initial Consult  Pharmacy Consult for Warfarin Indication: atrial fibrillation  Allergies  Allergen Reactions  . Darvon     Unknown reaction   . Digoxin And Related Nausea Only  . Penicillins Other (See Comments)    Was told had allergy from childhood... Unknown reaction  . Percocet [Oxycodone-Acetaminophen] Other (See Comments)    confused  . Percodan [Oxycodone-Aspirin]   . Vicodin [Hydrocodone-Acetaminophen] Other (See Comments)    confused    Patient Measurements:    Vital Signs: Temp: 98.2 F (36.8 C) (01/20 1011) Temp Source: Oral (01/20 1011) BP: 83/49 mmHg (01/20 1400) Pulse Rate: 83 (01/20 1400)  Labs:  Recent Labs  02/04/14 1052  HGB 12.6  HCT 39.8  PLT 133*  CREATININE 1.75*    CrCl cannot be calculated (Unknown ideal weight.).   Medical History: Past Medical History  Diagnosis Date  . Hypertension   . Coronary artery disease   . Legally blind   . Chronic kidney disease   . Anemia   . Hypothyroid   . Gastric ulcer   . Hiatal hernia   . Dyspepsia   . UTI (lower urinary tract infection)   . H/O: GI bleed 12//13  . Diastolic dysfunction   . Pacemaker   . High cholesterol   . CHF (congestive heart failure)   . Anginal pain   . MI (myocardial infarction) ? 1994  . DVT of upper extremity (deep vein thrombosis) 06/13/2012    BUE  . Seasonal allergies   . Allergy to perfume     "strong perfumes only" (06/13/2012)  . Shortness of breath     "can happen at any time" (06/13/2012)  . Type II diabetes mellitus   . History of blood transfusion 2013    "not related to OR; BP had dropped" (06/13/2012)  . GERD (gastroesophageal reflux disease)   . Daily headache   . Arthritis     "all over" (06/13/2012)  . A-fib   . 2Nd degree atrioventricular block   . Abnormal nuclear stress test, 08/2013 09/17/2013    Medications:  Scheduled:  . ondansetron (ZOFRAN) IV  4 mg Intravenous Once   Infusions:   PRN:    Assessment: 81 yo female Presents to ED with lethargy and hypotension. She is on chronic warfarin for afib. Home dose reported as 3.75mg  daily except 2.5mg  on Wed and Sun.  Last dose taken 1/19.  INR on admit is therapeutic at 2.9.  Of note, patient's LFTs are acutely elevated, therefore, will empirically reduce dose of warfarin today.  Goal of Therapy:  INR 2-3   Plan:   Warfarin 1mg  PO x 1 today.  Daily PT/INR  Peggyann Juba, PharmD, BCPS Pager: [phone] 02/04/2014,3:58 PM

## 2014-02-04 NOTE — ED Notes (Signed)
Bed: IH03 Expected date:  Expected time:  Means of arrival:  Comments: EMS-hypotensive

## 2014-02-04 NOTE — ED Notes (Signed)
Notified Dr. Stark Jock of Pt Troponin of .13

## 2014-02-04 NOTE — H&P (Signed)
[facility] History and Physical  [name]:[id number] DOB: 1920/01/17 DOA: 02/04/2014  Referring physician: ED physician PCP: Willey Blade, MD   Chief Complaint:   HPI:  81 y.o. Female with CAD, CHF with last known EF 40 - 45%, s/p pacemaker, HLD, DVT and history of A-fib (on Coumadin), presented to [facility] ED with main concern fairly sudden onset of malaise, abdominal pain with one episode of non bloody vomiting. Pt is unable to provide much history at the time of the admission and family at bedside able to assist with information. Per family member, care taker noted some sweats and has checked BP which was in 60's/30's and T 44F. Pt brought to the ED via EMS.  In ED, pt is somewhat lethargic, VS notable for BP 63/39 mmHg, otherwise stable. Blood work notable for abnormal LFT's: AST 1900, ALT 800, lipase > 100. One set of troponin 0.13. TR[facility] asked to admit for further evaluation.   Assessment and Plan: Active Problems: Hypotensive shock - unclear etiology at this time, elevated troponin and significant transaminitis, ? Demand ischemia  - admit to SDU for now, pt denies chest pain at this time but has significant CAD with pacemaker placed - start with providing IVF, analgesia as needed - cycle CE's, spoke with cardiologist on call, recommend continuing Coumadin and if needed can switch to Heparin drip once INR < 2 - pt not candidate for invasive interventions and per family PARTIAL CODE (OK with pressors only if needed) - stop Metoprolol and Lasix that pt takes at home - repeat 2 D ECHO  Transaminitis - unclear etiology, ? Ischemia - GI consulted for further assistance, appreciate input - place on empiric ABX for now Cipro and Flagyl (p has allergies to PCN, unclear what the reaction is)  - repeat CMET in AM, lipase in AM Acute encephalopathy - secondary to hypotension, dehydration - keep in SDU for now  Leukocytosis - ABX for ? Intra abd source: Cipro and Flagyl as  noted above - repeat CBC in AM - check procalcitonin level  Acute renal failure - likely pre renal - IVF as noted above and repeat BMP in AM Hx of A-fib and DVT - Coumadin per pharmacy  DM type II - hold Glipizide and place on SSI  Systolic and diastolic CHF - last 2 D ECHO in 2015 (Feb) - will repeat now  - daily weights, strict I's and O's - hold Lasix  Hypothyroidism - continue synthroid   DVT prophylaxis: already on Coumadin   Radiological Exams on Admission: Ct Abdomen Pelvis Wo Contrast  02/04/2014   CLINICAL DATA:  Weakness, abdominal pain and elevated LFTs  EXAM: CT ABDOMEN AND PELVIS WITHOUT CONTRAST  TECHNIQUE: Multidetector CT imaging of the abdomen and pelvis was performed following the standard protocol without IV contrast.  COMPARISON:  11/18/2012  FINDINGS: Lung bases are well aerated bilaterally. Minimal right basilar atelectasis is seen.  The liver is within normal limits. The gallbladder is well distended. The spleen, adrenal glands and pancreas are within normal limits. The kidneys are well visualized bilaterally with mild perinephric stranding similar to that seen on prior CT examination. Some hypodensities are noted consistent with renal cysts also stable from the prior exam. No calculi or obstructive changes are seen. Aortoiliac calcifications are noted. The appendix is not well visualized although no inflammatory changes are seen. Diffuse aortoiliac calcifications are seen. The bladder is partially decompressed. No pelvic mass lesion is seen. No acute bony abnormality is noted. Degenerative  anterolisthesis of L3 on L4 and L4 on L5 is noted.  IMPRESSION: Chronic changes as described above without acute abnormality.   Electronically Signed   By: Inez Catalina M.D.   On: 02/04/2014 14:45   Dg Abd Acute W/chest  02/04/2014   CLINICAL DATA:  Nausea and vomiting.  Fever.  EXAM: ACUTE ABDOMEN SERIES (ABDOMEN 2 VIEW & CHEST 1 VIEW)  COMPARISON:  Chest radiograph 10/20/2013.   FINDINGS: Frontal view of the chest shows midline trachea. Heart is enlarged. Pacemaker lead tips project over the right atrium and right ventricle. Lungs are somewhat low in volume but grossly clear. No definite pleural fluid.  Views of the abdomen show stool in the majority of the colon. There are scattered air-fluid levels. Gas is seen in the rectum. No definite small bowel dilatation. No free air.  IMPRESSION: 1. Bowel gas pattern is indicative of constipation. 2. No acute findings in the chest.   Electronically Signed   By: Lorin Picket M.D.   On: 02/04/2014 11:43     Code Status: Partial code, no intubation and no CPR. [place] for pressors if needed Family Communication: Family at bedside  Disposition Plan: Admit for further evaluation     Review of Systems:  Unable to obtain due to critical illness     Past Medical History  Diagnosis Date  . Hypertension   . Coronary artery disease   . Legally blind   . Chronic kidney disease   . Anemia   . Hypothyroid   . Gastric ulcer   . Hiatal hernia   . Dyspepsia   . UTI (lower urinary tract infection)   . H/O: GI bleed 12//13  . Diastolic dysfunction   . Pacemaker   . High cholesterol   . CHF (congestive heart failure)   . Anginal pain   . MI (myocardial infarction) ? 1994  . DVT of upper extremity (deep vein thrombosis) 06/13/2012    BUE  . Seasonal allergies   . Allergy to perfume     "strong perfumes only" (06/13/2012)  . Shortness of breath     "can happen at any time" (06/13/2012)  . Type II diabetes mellitus   . History of blood transfusion 2013    "not related to OR; BP had dropped" (06/13/2012)  . GERD (gastroesophageal reflux disease)   . Daily headache   . Arthritis     "all over" (06/13/2012)  . A-fib   . 2Nd degree atrioventricular block   . Abnormal nuclear stress test, 08/2013 09/17/2013    Past Surgical History  Procedure Laterality Date  . Esophagogastroduodenoscopy  12/22/2011    Procedure:  ESOPHAGOGASTRODUODENOSCOPY (EGD);  Surgeon: Gatha Mayer, MD;  Location: [facility];  Service: Endoscopy;  Laterality: N/A;  . Cardioversion  06/06/06    successful  . Insert / replace / remove pacemaker  06/29/2006    [employer] adapta  . Cardiac catheterization  10/25/92  . Cardiac catheterization  11/18/03    w/grafts 100%CX LAD 80 & 100%  . Cardiac catheterization  01/24/05    diffuse disease of native vessels  . Cardiac catheterization  06/06/06    severe native CAD  . Coronary artery bypass graft  10/27/92    LIMA to LAD,SVG to LAD second diagonal,obtuse maraginal of the CX and posterior descendingbranch of the RCA  . Cataract extraction w/ intraocular lens  implant, bilateral Bilateral   . Refractive surgery Bilateral     Social History:  reports that she has never  smoked. She has never used smokeless tobacco. She reports that she does not drink alcohol or use illicit drugs.  Allergies  Allergen Reactions  . Darvon     Unknown reaction   . Digoxin And Related Nausea Only  . Penicillins Other (See Comments)    Was told had allergy from childhood... Unknown reaction  . Percocet [Oxycodone-Acetaminophen] Other (See Comments)    confused  . Percodan [Oxycodone-Aspirin]   . Vicodin [Hydrocodone-Acetaminophen] Other (See Comments)    confused    Family History  Problem Relation Age of Onset  . Breast cancer Daughter   . Diabetes Son   . Diabetes Daughter   . Heart disease Father   . Diabetes Father   . Colon polyps Daughter   . Heart attack Brother   . Diabetes Brother   . Stroke Sister     Prior to Admission medications   Medication Sig Start Date End Date Taking? Authorizing Provider  aspirin 81 MG chewable tablet Chew 81 mg by mouth daily.   Yes Historical Provider, MD  furosemide (LASIX) 40 MG tablet Take 1 tablet daily x 3 days then as directed Patient taking differently: 40 mg every other day. Take 1 tablet daily x 3 days then as directed 08/20/13  Yes Isaiah Serge, NP  gabapentin (NEURONTIN) 100 MG capsule Take 100 mg by mouth 2 (two) times daily.    Yes Historical Provider, MD  glipiZIDE (GLUCOTROL) 5 MG tablet Take one-half tablet by  mouth daily only if blood  sugar is over 200 01/19/14  Yes Elayne Snare, MD  guaiFENesin (MUCINEX) 600 MG 12 hr tablet Take 600 mg by mouth 2 (two) times daily.   Yes Historical Provider, MD  Hyoscyamine Sulfate 0.375 MG TBCR A1 tab twice a day as needed, abdominal pain 06/30/13  Yes Inda Castle, MD  levothyroxine (SYNTHROID, LEVOTHROID) 50 MCG tablet Take 1 tablet (50 mcg total) by mouth daily before breakfast. 01/05/14  Yes Elayne Snare, MD  Linaclotide [facility]) 145 MCG CAPS capsule Take 1 capsule (145 mcg total) by mouth daily. 07/07/13  Yes Inda Castle, MD  loratadine (CLARITIN) 10 MG tablet Take 10 mg by mouth daily.   Yes Historical Provider, MD  metoprolol tartrate (LOPRESSOR) 25 MG tablet Take 1 tablet (25 mg total) by mouth 3 (three) times daily. 10/31/13  Yes Burtis Junes, NP  nitroGLYCERIN (NITRODUR - DOSED IN MG/24 HR) 0.4 mg/hr patch Place 1 patch (0.4 mg total) onto the skin daily. 10/17/13  Yes Mihai Croitoru, MD  omeprazole (PRILOSEC) 20 MG capsule Take 1 capsule (20 mg total) by mouth daily. 06/04/13  Yes Mihai Croitoru, MD  rosuvastatin (CRESTOR) 10 MG tablet Take 10 mg by mouth at bedtime.    Yes Historical Provider, MD  warfarin (COUMADIN) 2.5 MG tablet Take 1 to 1.5 tablets by mouth daily as directed by coumadin clinic Patient taking differently: Take 2.5-3.75 mg by mouth daily. Takes 1 tablet on Wednesdays and Sundays. 1.5 tablets on all other days 08/22/13  Yes Tommy Medal, RPH-CPP  zolpidem (AMBIEN) 10 MG tablet Take 5 mg by mouth at bedtime as needed. sleep   Yes Historical Provider, MD  ACCU-CHEK AVIVA PLUS test strip Use to check blood sugars 2 times daily 01/19/14   Elayne Snare, MD  Blood Glucose Monitoring Suppl (ACCU-CHEK AVIVA PLUS) W/DEVICE KIT Use to check blood sugar 2 times per day dx  code 250.00 07/30/13   Elayne Snare, MD  hydrocortisone (ANUSOL-HC) 25 MG suppository  Use 1 suppository rectally at bedtime for 7 days. Patient not taking: Reported on 02/04/2014 04/01/13   Amy S Esterwood, PA-C  Lancets (ACCU-CHEK MULTICLIX) lancets Use as instructed to check blood sugar 2 times per day dx code 250.00 07/30/13   Elayne Snare, MD  Lancets Misc. (ACCU-CHEK FASTCLIX LANCET) KIT Use as directec 03/17/13   Elayne Snare, MD  potassium chloride SA (K-DUR,KLOR-CON) 20 MEQ tablet take 1 tablet by mouth once daily for 3 days  WITH FUROSEMIDE THEN AS DIRECTED Patient not taking: Reported on 02/04/2014 12/29/13   Sanda Klein, MD    Physical Exam: Filed Vitals:   02/04/14 1245 02/04/14 1314 02/04/14 1315 02/04/14 1400  BP: 1$Rem'03/74 97/47 91/53 'rtXu$ 83/49  Pulse: 86 82 83 83  Temp:      TempSrc:      Resp: $Remo'13 18 15 13  'ZhILg$ SpO2: 98% 98% 98% 99%    Physical Exam  Constitutional: Appears somnolent but can be aroused, NAD HENT: Normocephalic. External right and left ear normal. Dry MM Eyes: Conjunctivae and EOM are normal. PERRLA, no scleral icterus.  Neck: Normal ROM. Neck supple. No JVD. No tracheal deviation. No thyromegaly.  CVS: RRR, no gallops, no carotid bruit.  Pulmonary: Effort and breath sounds normal, no stridor, diminished breath sounds at bases  Abdominal: Soft. BS +,  no distension, tenderness in epigastric area, no rebound or guarding.  Musculoskeletal: Normal range of motion. No edema and no tenderness.  Lymphadenopathy: No lymphadenopathy noted, cervical, inguinal. Neuro: Moving all 4 extremities, no evident CN defects  Skin: Skin is warm and dry. No rash noted. Not diaphoretic. No erythema. No pallor.  Psychiatric: Difficult to assess due to critical illness   Labs on Admission:  Basic Metabolic Panel:  Recent Labs Lab 02/04/14 1052  NA 136  K 3.5  CL 101  CO2 24  GLUCOSE 231*  BUN 23  CREATININE 1.75*  CALCIUM 9.0   Liver Function Tests:  Recent Labs Lab  02/04/14 1052  AST 1916*  ALT 857*  ALKPHOS 217*  BILITOT 2.4*  PROT 6.6  ALBUMIN 3.5    Recent Labs Lab 02/04/14 1052  LIPASE 159*   CBC:  Recent Labs Lab 02/04/14 1052  WBC 14.3*  NEUTROABS 13.6*  HGB 12.6  HCT 39.8  MCV 92.1  PLT 133*  EKG: Normal sinus rhythm, no ST/T wave changes  Faye Ramsay, MD  [facility] Pager [phone]  If 7PM-7AM, please contact night-coverage [email or web address] Password [email or web address] 02/04/2014, 3:06 PM

## 2014-02-05 ENCOUNTER — Inpatient Hospital Stay (HOSPITAL_COMMUNITY): Payer: Medicare Other

## 2014-02-05 DIAGNOSIS — I509 Heart failure, unspecified: Secondary | ICD-10-CM

## 2014-02-05 DIAGNOSIS — R7989 Other specified abnormal findings of blood chemistry: Secondary | ICD-10-CM | POA: Insufficient documentation

## 2014-02-05 DIAGNOSIS — N390 Urinary tract infection, site not specified: Secondary | ICD-10-CM

## 2014-02-05 DIAGNOSIS — K850 Idiopathic acute pancreatitis: Secondary | ICD-10-CM

## 2014-02-05 DIAGNOSIS — R1013 Epigastric pain: Secondary | ICD-10-CM | POA: Insufficient documentation

## 2014-02-05 DIAGNOSIS — N179 Acute kidney failure, unspecified: Secondary | ICD-10-CM

## 2014-02-05 DIAGNOSIS — R945 Abnormal results of liver function studies: Secondary | ICD-10-CM | POA: Insufficient documentation

## 2014-02-05 DIAGNOSIS — G9340 Encephalopathy, unspecified: Secondary | ICD-10-CM

## 2014-02-05 LAB — GLUCOSE, CAPILLARY
GLUCOSE-CAPILLARY: 147 mg/dL — AB (ref 70–99)
GLUCOSE-CAPILLARY: 87 mg/dL (ref 70–99)
GLUCOSE-CAPILLARY: 118 mg/dL — AB (ref 70–99)
Glucose-Capillary: 115 mg/dL — ABNORMAL HIGH (ref 70–99)

## 2014-02-05 LAB — COMPREHENSIVE METABOLIC PANEL
ALK PHOS: 173 U/L — AB (ref 39–117)
ALT: 542 U/L — AB (ref 0–35)
AST: 718 U/L — AB (ref 0–37)
Albumin: 3 g/dL — ABNORMAL LOW (ref 3.5–5.2)
Anion gap: 9 (ref 5–15)
BUN: 28 mg/dL — AB (ref 6–23)
CO2: 25 mmol/L (ref 19–32)
CREATININE: 1.73 mg/dL — AB (ref 0.50–1.10)
Calcium: 8.3 mg/dL — ABNORMAL LOW (ref 8.4–10.5)
Chloride: 102 mEq/L (ref 96–112)
GFR, EST AFRICAN AMERICAN: 29 mL/min — AB (ref >90)
GFR, EST NON AFRICAN AMERICAN: 25 mL/min — AB (ref >90)
GLUCOSE: 134 mg/dL — AB (ref 70–99)
POTASSIUM: 4.1 mmol/L (ref 3.5–5.1)
Sodium: 136 mmol/L (ref 135–145)
Total Bilirubin: 2.8 mg/dL — ABNORMAL HIGH (ref 0.3–1.2)
Total Protein: 6.1 g/dL (ref 6.0–8.3)

## 2014-02-05 LAB — URINE CULTURE
COLONY COUNT: NO GROWTH
COLONY COUNT: NO GROWTH
CULTURE: NO GROWTH
Culture: NO GROWTH
Special Requests: NORMAL

## 2014-02-05 LAB — TROPONIN I
Troponin I: 0.12 ng/mL — ABNORMAL HIGH (ref <0.031)
Troponin I: 0.11 ng/mL — ABNORMAL HIGH (ref <0.031)

## 2014-02-05 LAB — PROTIME-INR
INR: 3.23 — ABNORMAL HIGH (ref 0.00–1.49)
Prothrombin Time: 33.2 seconds — ABNORMAL HIGH (ref 11.6–15.2)

## 2014-02-05 LAB — CBC
HCT: 36.4 % (ref 36.0–46.0)
Hemoglobin: 11.4 g/dL — ABNORMAL LOW (ref 12.0–15.0)
MCH: 28.7 pg (ref 26.0–34.0)
MCHC: 31.3 g/dL (ref 30.0–36.0)
MCV: 91.7 fL (ref 78.0–100.0)
Platelets: 138 10*3/uL — ABNORMAL LOW (ref 150–400)
RBC: 3.97 MIL/uL (ref 3.87–5.11)
RDW: 14 % (ref 11.5–15.5)
WBC: 14.6 10*3/uL — AB (ref 4.0–10.5)

## 2014-02-05 LAB — PROCALCITONIN: PROCALCITONIN: 49.9 ng/mL

## 2014-02-05 LAB — HEMOGLOBIN A1C
Hgb A1c MFr Bld: 7.9 % — ABNORMAL HIGH (ref <5.7)
Mean Plasma Glucose: 180 mg/dL — ABNORMAL HIGH (ref <117)

## 2014-02-05 LAB — CK: CK TOTAL: 57 U/L (ref 7–177)

## 2014-02-05 LAB — LIPASE, BLOOD: Lipase: 26 U/L (ref 11–59)

## 2014-02-05 MED ORDER — SODIUM CHLORIDE 0.9 % IV SOLN
INTRAVENOUS | Status: DC
  Administered 2014-02-05: 11:00:00 via INTRAVENOUS

## 2014-02-05 MED ORDER — ACETAMINOPHEN 500 MG PO TABS
500.0000 mg | ORAL_TABLET | Freq: Four times a day (QID) | ORAL | Status: DC | PRN
  Administered 2014-02-05 (×2): 500 mg via ORAL
  Filled 2014-02-05 (×2): qty 1

## 2014-02-05 MED ORDER — METOPROLOL SUCCINATE ER 25 MG PO TB24
25.0000 mg | ORAL_TABLET | Freq: Every day | ORAL | Status: DC
  Administered 2014-02-06 – 2014-02-07 (×2): 25 mg via ORAL
  Filled 2014-02-05 (×2): qty 1

## 2014-02-05 MED ORDER — ASPIRIN EC 81 MG PO TBEC: 81.0000 mg | DELAYED_RELEASE_TABLET | Freq: Every day | ORAL | Status: DC

## 2014-02-05 MED ORDER — MAGNESIUM CITRATE PO SOLN
1.0000 | Freq: Once | ORAL | Status: AC
  Administered 2014-02-06: 1 via ORAL

## 2014-02-05 NOTE — Progress Notes (Addendum)
[facility] PROGRESS NOTE  [name] GHW:[id number] DOB: [birth year] DOA: 02/04/2014 PCP: Marlou Sa [name], MD  Assessment/Plan: Active Problems:   Pancreatitis    Hypotensive shock-resolved Mildly elevated but stable troponin secondary to Demand ischemia  tx to tele , pt denies chest pain at this time but has significant CAD with pacemaker  Continue IVF, analgesia as needed CE stable,s spoke with cardiology on call,  Since cardiac enzymes are stable we'll obtain a 2-D echo if no wall motion abnormalities and no further workup is indicated - pt not candidate for invasive interventions and per family PARTIAL CODE (OK with pressors only if needed) Restart low-dose metoprolol , start aspirin  Transaminitis Acute hepatitis panel, Tylenol level negative, Couldn't the transaminitis be secondary to ischemia? - GI consulted for further assistance, appreciate input -cont empiric ABX for now Cipro and Flagyl  Normal ultrasound of the gallbladder, normal Doppler with patent portal venous system Continue to follow liver function  Acute encephalopathy - secondary to hypotension, dehydration, UTI Improving   Leukocytosis/UTI - ABX for ? Intra abd source/UTI: Continue Cipro and Flagyl as noted above Pro calcitonin level significantly elevated  Acute renal failure Baseline creatinine of about 1.2-1.3 - likely pre renal - IVF as noted above and repeat BMP in AM  Hx of A-fib and DVT - Coumadin per pharmacy   DM type II - hold Glipizide and place on SSI  AIC 7.9  Systolic and diastolic CHF - last 2 D ECHO in 2015 (Feb) - will repeat now  - daily weights, strict I's and O's - hold Lasix   Hypothyroidism - continue synthroid    Code Status: full Family Communication: family updated about patient's clinical progress Disposition Plan:  As above    Brief narrative: *[name] is a 81 y.o. female is a 81 yo female with a past medical history of CAD,  CHF with EF of 40-45%,ms/p pacemaker placement for heart block, hx a-fib and hypertension. She has had a DVT and is maintained on coumadin.She is known to Dr Deatra Ina for chronic constipation for many years. She lives at home and her children care for her. Tis moring, a neighbor who helps care for her was bathing the patient and noticed the patient was very sweaty. She took patients temp and it was 99. She took patients BP and systolic was in the 78L. She was brought to ER via EMS, where she was also noted to be hypotensive and was given fluids. Patients daughter says patient ate barbecued ribs last night and about an hour or so later developed abdominal pain and vomited. She felt better after she vomited, but today patient says she has some pain and points to the epigastric area. She feels weak and has waves of nausea but has not vomited today. She says her abdomen feels very full.She last had a BM 4 or 5 days ago per daughter.Pt denies CP or SOB. In ER, pt was noted to have elevated LFTs. Pt has not had any new meds.  Consultants:  Gastroenterology  Procedures:  *None  Antibiotics: Ciprofloxacin, Flagyl  HPI/Subjective: Denies any nausea vomiting, patient is constipated according to the daughter was present by the bedside, alert and oriented and appears comfortable and hemodynamically stable  Objective: Filed Vitals:   02/05/14 0500 02/05/14 0600 02/05/14 0700 02/05/14 0800  BP: 101/48 91/44 105/56   Pulse: 74 74 77   Temp:    99 F (37.2 C)  TempSrc:    Oral  Resp: 14  15 24   Height:      Weight:      SpO2: 99% 100% 100%     Intake/Output Summary (Last 24 hours) at 02/05/14 1013 Last data filed at 02/05/14 0620  Gross per 24 hour  Intake 865.83 ml  Output    200 ml  Net 665.83 ml    Exam:  General: alert & oriented x 3 In NAD  Cardiovascular: RRR, nl S1 s2  Respiratory: Decreased breath sounds at the bases, scattered rhonchi, no crackles  Abdomen: soft +BS NT/ND, no  masses palpable  Extremities: No cyanosis and no edema      Data Reviewed: Basic Metabolic Panel:  Recent Labs Lab 02/04/14 1052 02/05/14 0536  NA 136 136  K 3.5 4.1  CL 101 102  CO2 24 25  GLUCOSE 231* 134*  BUN 23 28*  CREATININE 1.75* 1.73*  CALCIUM 9.0 8.3*    Liver Function Tests:  Recent Labs Lab 02/04/14 1052 02/05/14 0536  AST 1916* 718*  ALT 857* 542*  ALKPHOS 217* 173*  BILITOT 2.4* 2.8*  PROT 6.6 6.1  ALBUMIN 3.5 3.0*    Recent Labs Lab 02/04/14 1052 02/05/14 0536  LIPASE 159* 26   No results for input(s): AMMONIA in the last 168 hours.  CBC:  Recent Labs Lab 02/04/14 1052 02/05/14 0536  WBC 14.3* 14.6*  NEUTROABS 13.6*  --   HGB 12.6 11.4*  HCT 39.8 36.4  MCV 92.1 91.7  PLT 133* 138*    Cardiac Enzymes:  Recent Labs Lab 02/04/14 1929 02/04/14 2327 02/05/14 0536 02/05/14 0832  CKTOTAL  --   --   --  57  TROPONINI 0.15* 0.12* 0.11*  --    BNP (last 3 results)  Recent Labs  02/14/13 1820  PROBNP 991.8*     CBG: No results for input(s): GLUCAP in the last 168 hours.  Recent Results (from the past 240 hour(s))  Blood culture (routine x 2)     Status: None (Preliminary result)   Collection Time: 02/04/14 10:52 AM  Result Value Ref Range Status   Specimen Description BLOOD RIGHT HAND  Final   Special Requests   Final    BOTTLES DRAWN AEROBIC AND ANAEROBIC 2ML AER 1ML ANA   Culture   Final           BLOOD CULTURE RECEIVED NO GROWTH TO DATE CULTURE WILL BE HELD FOR 5 DAYS BEFORE ISSUING A FINAL NEGATIVE REPORT Performed at [facility]    Report Status PENDING  Incomplete  Blood culture (routine x 2)     Status: None (Preliminary result)   Collection Time: 02/04/14 10:52 AM  Result Value Ref Range Status   Specimen Description BLOOD RIGHT FOREARM  Final   Special Requests BOTTLES DRAWN AEROBIC AND ANAEROBIC 5ML  Final   Culture   Final           BLOOD CULTURE RECEIVED NO GROWTH TO DATE CULTURE WILL BE HELD  FOR 5 DAYS BEFORE ISSUING A FINAL NEGATIVE REPORT Performed at [facility]    Report Status PENDING  Incomplete  MRSA PCR Screening     Status: None   Collection Time: 02/04/14  6:08 PM  Result Value Ref Range Status   MRSA by PCR NEGATIVE NEGATIVE Final    Comment:        The GeneXpert MRSA Assay (FDA approved for NASAL specimens only), is one component of a comprehensive MRSA colonization surveillance program. It is not intended to  diagnose MRSA infection nor to guide or monitor treatment for MRSA infections.      Studies: Ct Abdomen Pelvis Wo Contrast  02/04/2014   CLINICAL DATA:  Weakness, abdominal pain and elevated LFTs  EXAM: CT ABDOMEN AND PELVIS WITHOUT CONTRAST  TECHNIQUE: Multidetector CT imaging of the abdomen and pelvis was performed following the standard protocol without IV contrast.  COMPARISON:  11/18/2012  FINDINGS: Lung bases are well aerated bilaterally. Minimal right basilar atelectasis is seen.  The liver is within normal limits. The gallbladder is well distended. The spleen, adrenal glands and pancreas are within normal limits. The kidneys are well visualized bilaterally with mild perinephric stranding similar to that seen on prior CT examination. Some hypodensities are noted consistent with renal cysts also stable from the prior exam. No calculi or obstructive changes are seen. Aortoiliac calcifications are noted. The appendix is not well visualized although no inflammatory changes are seen. Diffuse aortoiliac calcifications are seen. The bladder is partially decompressed. No pelvic mass lesion is seen. No acute bony abnormality is noted. Degenerative anterolisthesis of L3 on L4 and L4 on L5 is noted.  IMPRESSION: Chronic changes as described above without acute abnormality.   Electronically Signed   By: Inez Catalina M.D.   On: 02/04/2014 14:45   US Abdomen Complete  02/05/2014   CLINICAL DATA:  Abnormal LFTs with nausea and epigastric pain.  EXAM:  ULTRASOUND ABDOMEN COMPLETE; DUPLEX ULTRASOUND OF THE LIVER  COMPARISON:  Abdominal CT 02/12/2014  FINDINGS: Gallbladder: Gallbladder is mildly distended. The gallbladder wall is upper limits of normal measuring 3 mm. Reportedly, the patient does not have a sonographic Murphy's sign. No evidence for gallstones.  Common bile duct: Diameter: 5 mm.  Liver: No focal lesion identified. Within normal limits in parenchymal echogenicity.  IVC: No abnormality visualized.  Pancreas: Visualized portion unremarkable.  Spleen: Size and appearance within normal limits. Spleen measures 7.0 cm in length.  Right Kidney: Length: 9.3 cm. Echogenicity within normal limits. No mass or hydronephrosis visualized.  Left Kidney: Length: 10.9 cm. Echogenicity within normal limits. No mass or hydronephrosis visualized.  Abdominal aorta: No aneurysm visualized. The distal abdominal aorta is obscured by bowel gas.  Portal Vein Velocities  Main:  31 cm/sec  Right:  20 cm/sec  Left:  14 cm/sec  Hepatic Vein Velocities  Right:  22 cm/sec  Middle:  21 cm/sec  Left:  34 cm/sec  Hepatic Artery Velocity: 91 cm/sec  Splenic Vein Velocity: 15 cm/sec  Varices: None  Ascites: None  Other findings: Normal hepatopetal flow in the portal veins. Normal hepatofugal flow in the hepatic veins. No evidence for portal vein or splenic vein thrombus.  IMPRESSION: The gallbladder wall is upper limits of normal. No evidence for gallstones and the patient does not have a sonographic Murphy's sign.  Normal appearance of the liver with a patent portal venous system. Normal liver duplex examination.   Electronically Signed   By: Markus Daft M.D.   On: 02/05/2014 08:46   [place] Art/ven Flow Abd Pelv Doppler  02/05/2014   CLINICAL DATA:  Abnormal LFTs with nausea and epigastric pain.  EXAM: ULTRASOUND ABDOMEN COMPLETE; DUPLEX ULTRASOUND OF THE LIVER  COMPARISON:  Abdominal CT 02/12/2014  FINDINGS: Gallbladder: Gallbladder is mildly distended. The gallbladder wall is upper  limits of normal measuring 3 mm. Reportedly, the patient does not have a sonographic Murphy's sign. No evidence for gallstones.  Common bile duct: Diameter: 5 mm.  Liver: No focal lesion identified. Within normal limits in  parenchymal echogenicity.  IVC: No abnormality visualized.  Pancreas: Visualized portion unremarkable.  Spleen: Size and appearance within normal limits. Spleen measures 7.0 cm in length.  Right Kidney: Length: 9.3 cm. Echogenicity within normal limits. No mass or hydronephrosis visualized.  Left Kidney: Length: 10.9 cm. Echogenicity within normal limits. No mass or hydronephrosis visualized.  Abdominal aorta: No aneurysm visualized. The distal abdominal aorta is obscured by bowel gas.  Portal Vein Velocities  Main:  31 cm/sec  Right:  20 cm/sec  Left:  14 cm/sec  Hepatic Vein Velocities  Right:  22 cm/sec  Middle:  21 cm/sec  Left:  34 cm/sec  Hepatic Artery Velocity: 91 cm/sec  Splenic Vein Velocity: 15 cm/sec  Varices: None  Ascites: None  Other findings: Normal hepatopetal flow in the portal veins. Normal hepatofugal flow in the hepatic veins. No evidence for portal vein or splenic vein thrombus.  IMPRESSION: The gallbladder wall is upper limits of normal. No evidence for gallstones and the patient does not have a sonographic Murphy's sign.  Normal appearance of the liver with a patent portal venous system. Normal liver duplex examination.   Electronically Signed   By: Markus Daft M.D.   On: 02/05/2014 08:46   Dg Abd Acute W/chest  02/04/2014   CLINICAL DATA:  Nausea and vomiting.  Fever.  EXAM: ACUTE ABDOMEN SERIES (ABDOMEN 2 VIEW & CHEST 1 VIEW)  COMPARISON:  Chest radiograph 10/20/2013.  FINDINGS: Frontal view of the chest shows midline trachea. Heart is enlarged. Pacemaker lead tips project over the right atrium and right ventricle. Lungs are somewhat low in volume but grossly clear. No definite pleural fluid.  Views of the abdomen show stool in the majority of the colon. There are  scattered air-fluid levels. Gas is seen in the rectum. No definite small bowel dilatation. No free air.  IMPRESSION: 1. Bowel gas pattern is indicative of constipation. 2. No acute findings in the chest.   Electronically Signed   By: Lorin Picket M.D.   On: 02/04/2014 11:43    Scheduled Meds: . aspirin  81 mg Oral Daily  . ciprofloxacin  200 mg Intravenous Q12H  . insulin aspart  0-9 Units Subcutaneous TID WC  . levothyroxine  50 mcg Oral QAC breakfast  . metronidazole  500 mg Intravenous Q8H  . tobramycin  1 drop Right Eye 4 times per day  . warfarin  1 mg Oral ONCE-1800  . Warfarin - Pharmacist Dosing Inpatient   Does not apply q1800   Continuous Infusions:   Active Problems:   Pancreatitis    Time spent: 40 minutes   [facility] Pager [phone]. If 7PM-7AM, please contact night-coverage at [email or web address], password [facility] 02/05/2014, 10:13 AM  LOS: 1 day

## 2014-02-05 NOTE — Progress Notes (Signed)
ANTICOAGULATION CONSULT NOTE - Initial Consult  Pharmacy Consult for Warfarin Indication: atrial fibrillation  Allergies  Allergen Reactions  . Darvon     Unknown reaction   . Digoxin And Related Nausea Only  . Penicillins Other (See Comments)    Was told had allergy from childhood... Unknown reaction  . Percocet [Oxycodone-Acetaminophen] Other (See Comments)    confused  . Percodan [Oxycodone-Aspirin]   . Vicodin [Hydrocodone-Acetaminophen] Other (See Comments)    confused    Patient Measurements: Height: _0  (154.9 cm) Weight: 196 lb 3.4 oz (89 kg) IBW/kg (Calculated) : 47.8  Vital Signs: Temp: 98.7 F (37.1 C) (01/21 1200) Temp Source: Oral (01/21 1200) BP: 109/63 mmHg (01/21 1000) Pulse Rate: 91 (01/21 1000)  Labs:  Recent Labs  02/04/14 1052 02/04/14 1542 02/04/14 1929 02/04/14 2327 02/05/14 0536 02/05/14 0832  HGB 12.6  --   --   --  11.4*  --   HCT 39.8  --   --   --  36.4  --   PLT 133*  --   --   --  138*  --   LABPROT  --  30.5*  --   --  33.2*  --   INR  --  2.90*  --   --  3.23*  --   CREATININE 1.75*  --   --   --  1.73*  --   CKTOTAL  --   --   --   --   --  57  TROPONINI  --   --  0.15* 0.12* 0.11*  --     Estimated Creatinine Clearance: 21.5 mL/min (by C-G formula based on Cr of 1.73).    Medications:  Scheduled:  . aspirin  81 mg Oral Daily  . ciprofloxacin  200 mg Intravenous Q12H  . insulin aspart  0-9 Units Subcutaneous TID WC  . levothyroxine  50 mcg Oral QAC breakfast  . metoprolol succinate  25 mg Oral Daily  . metronidazole  500 mg Intravenous Q8H  . tobramycin  1 drop Right Eye 4 times per day  . warfarin  1 mg Oral ONCE-1800  . Warfarin - Pharmacist Dosing Inpatient   Does not apply q1800    Assessment: 81 yo female Presents to ED with lethargy and hypotension. She is on chronic warfarin for afib. Home dose reported as 3.12m daily except 2.543mon Wed and Sun.  Last dose taken 1/19.  INR on admit is therapeutic at 2.9.   Pharmacy is consulted to continue warfarin dosing inpatient.  Today, 02/05/2014   Warfarin dose 1/20 NOT given.  Pt was NPO at 1800.  INR 3.23, supra-therapeutic despite no dose yesterday.  Likely d/t drug interactions (see below).  CBC:  Hgb slightly decreased to 11.4, Plt stable at 138.  No bleeding reported.  Alk Phos, AST/ALT, lipase elevated on admission, but improved today.  Tbili increased 2.4 to 2.8  SCr remains elevated at 1.73  Diet: advanced to clear liquids 1/21  Drug-drug interactions:  Cipro/Flagyl will significantly increase the anticoagulant effects of warfarin.   Goal of Therapy:  INR 2-3   Plan:   No further warfarin today.    Daily PT/INR  Pharmacy to resume warfarin dosing when INR < 3.  ChGretta ArabharmD, BCPS Pager [phone]/21/2016 1:12 PM

## 2014-02-05 NOTE — Progress Notes (Signed)
  Echocardiogram 2D Echocardiogram has been performed.  [name] 02/05/2014, 2:57 PM

## 2014-02-05 NOTE — Progress Notes (Signed)
Pt stable for transport to floor. VSS. Full report given to 4E RN.

## 2014-02-05 NOTE — Progress Notes (Signed)
INITIAL NUTRITION ASSESSMENT  DOCUMENTATION CODES Per approved criteria  -Obesity Unspecified   INTERVENTION: -Diet advancement per MD -When diet advanced past clears, recommend Glucerna Shake po daily, each supplement provides 220 kcal and 10 grams of protein. (Order for 1800) -RD to continue to monitor  NUTRITION DIAGNOSIS: Inadequate oral intake related to inability to eat solids as evidenced by CL diet.   Goal: Pt to meet >/= 90% of their estimated nutrition needs   Monitor:  PO and supplemental intake, weight, labs, I/O's  Reason for Assessment: Pt identified as at nutrition risk on the Malnutrition Screen Tool  Admitting Dx: pancreatitis  ASSESSMENT: 81 y.o. Female with CAD, CHF with last known EF 40 - 45%, s/p pacemaker, HLD, DVT and history of A-fib (on Coumadin), presented to [facility] ED with main concern fairly sudden onset of malaise, abdominal pain with one episode of non bloody vomiting.  Spoke with patient and family at bedside. 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   Pt is currently on clear liquids. PTA pt was consuming two meals a day and drinking one Glucerna shake daily. Family reports good blood sugar control. HgbA1c: 7.9.  Encouraged continuing to check blood sugar regularly. Advised that patient should include something for lunch since she only eats breakfast and dinner at home. RD to order Glucerna Shakes daily when diet is advanced past clears.  Weight history documentation shows minimal weight loss.  Nutrition focused physical exam shows no sign of depletion of muscle mass or body fat.  Labs reviewed: Elevated BUN & Creatinine  Height: Ht Readings from Last 1 Encounters:  02/04/14 5\' 1"  (1.549 m)    Weight: Wt Readings from Last 1 Encounters:  02/04/14 196 lb 3.4 oz (89 kg)    Ideal Body Weight: 105 lb  % Ideal Body Weight: 187%  Wt Readings from Last 10 Encounters:  02/04/14 196 lb 3.4 oz (89 kg)  11/19/13 195 lb (88.451 kg)  10/31/13  198 lb 12.8 oz (90.175 kg)  10/24/13 201 lb 12.8 oz (91.536 kg)  09/15/13 203 lb 8 oz (92.307 kg)  08/20/13 201 lb (91.173 kg)  04/01/13 192 lb (87.091 kg)  02/20/13 197 lb (89.359 kg)  01/28/13 192 lb 9.6 oz (87.363 kg)  01/24/13 268 lb (121.564 kg)    Usual Body Weight: 175-190 lb  % Usual Body Weight: 103%  BMI:  Body mass index is 37.09 kg/(m^2).  Estimated Nutritional Needs: Kcal: 1700-1900 Protein: 65-75g Fluid: 1.7L/day  Skin: intact  Diet Order: Diet clear liquid  EDUCATION NEEDS: -No education needs identified at this time   Intake/Output Summary (Last 24 hours) at 02/05/14 0937 Last data filed at 02/05/14 0620  Gross per 24 hour  Intake 865.83 ml  Output    200 ml  Net 665.83 ml    Last BM: PTA  Labs:   Recent Labs Lab 02/04/14 1052 02/05/14 0536  NA 136 136  K 3.5 4.1  CL 101 102  CO2 24 25  BUN 23 28*  CREATININE 1.75* 1.73*  CALCIUM 9.0 8.3*  GLUCOSE 231* 134*    CBG (last 3)  No results for input(s): GLUCAP in the last 72 hours.  Scheduled Meds: . aspirin  81 mg Oral Daily  . ciprofloxacin  200 mg Intravenous Q12H  . insulin aspart  0-9 Units Subcutaneous TID WC  . levothyroxine  50 mcg Oral QAC breakfast  . metronidazole  500 mg Intravenous Q8H  . tobramycin  1 drop Right Eye  4 times per day  . warfarin  1 mg Oral ONCE-1800  . Warfarin - Pharmacist Dosing Inpatient   Does not apply q1800    Continuous Infusions:   Past Medical History  Diagnosis Date  . Hypertension   . Coronary artery disease   . Legally blind   . Chronic kidney disease   . Anemia   . Hypothyroid   . Gastric ulcer   . Hiatal hernia   . Dyspepsia   . UTI (lower urinary tract infection)   . H/O: GI bleed 12//13  . Diastolic dysfunction   . Pacemaker   . High cholesterol   . CHF (congestive heart failure)   . Anginal pain   . MI (myocardial infarction) ? 1994  . DVT of upper extremity (deep vein thrombosis) 06/13/2012    BUE  . Seasonal allergies    . Allergy to perfume     "strong perfumes only" (06/13/2012)  . Shortness of breath     "can happen at any time" (06/13/2012)  . Type II diabetes mellitus   . History of blood transfusion 2013    "not related to OR; BP had dropped" (06/13/2012)  . GERD (gastroesophageal reflux disease)   . Daily headache   . Arthritis     "all over" (06/13/2012)  . A-fib   . 2Nd degree atrioventricular block   . Abnormal nuclear stress test, 08/2013 09/17/2013    Past Surgical History  Procedure Laterality Date  . Esophagogastroduodenoscopy  12/22/2011    Procedure: ESOPHAGOGASTRODUODENOSCOPY (EGD);  Surgeon: Gatha Mayer, MD;  Location: [facility];  Service: Endoscopy;  Laterality: N/A;  . Cardioversion  06/06/06    successful  . Insert / replace / remove pacemaker  06/29/2006    [employer]onic adapta  . Cardiac catheterization  10/25/92  . Cardiac catheterization  11/18/03    w/grafts 100%CX LAD 80 & 100%  . Cardiac catheterization  01/24/05    diffuse disease of native vessels  . Cardiac catheterization  06/06/06    severe native CAD  . Coronary artery bypass graft  10/27/92    LIMA to LAD,SVG to LAD second diagonal,obtuse maraginal of the CX and posterior descendingbranch of the RCA  . Cataract extraction w/ intraocular lens  implant, bilateral Bilateral   . Refractive surgery Bilateral     Clayton Bibles, MS, RD, LDN Pager: [phone] After Hours Pager: [phone]

## 2014-02-05 NOTE — Progress Notes (Signed)
[facility] Progress Note  Subjective:  Feels good.  Tolerated clear liquids for breakfast, no further vomiting.  Says that upper abdomen has mild soreness.  Objective:  Vital signs in last 24 hours: Temp:  [97.9 F (36.6 C)-99 F (37.2 C)] 99 F (37.2 C) (01/21 0800) Pulse Rate:  [74-102] 77 (01/21 0700) Resp:  [11-24] 24 (01/21 0700) BP: (65-125)/(35-76) 105/56 mmHg (01/21 0700) SpO2:  [95 %-100 %] 100 % (01/21 0700) Weight:  [196 lb 3.4 oz (89 kg)] 196 lb 3.4 oz (89 kg) (01/20 1751)   General:  Alert, Well-developed, in NAD Heart:  Regular rate and rhythm; no murmurs Pulm:  CTAB.  No W/R/R. Abdomen:  Soft, non-distended. Normal bowel sounds.  Minimal RUQ TTP without R/R/G. Extremities:  Without edema. Neurologic:  Alert and  oriented x4;  grossly normal neurologically. Psych:  Alert and cooperative. Normal mood and affect.  Intake/Output from previous day: 01/20 0701 - 01/21 0700 In: 865.8 [I.V.:565.8; IV Piggyback:300] Out: 200 [Urine:200]  Lab Results:  Recent Labs  02/04/14 1052 02/05/14 0536  WBC 14.3* 14.6*  HGB 12.6 11.4*  HCT 39.8 36.4  PLT 133* 138*   BMET  Recent Labs  02/04/14 1052 02/05/14 0536  NA 136 136  K 3.5 4.1  CL 101 102  CO2 24 25  GLUCOSE 231* 134*  BUN 23 28*  CREATININE 1.75* 1.73*  CALCIUM 9.0 8.3*   LFT  Recent Labs  02/05/14 0536  PROT 6.1  ALBUMIN 3.0*  AST 718*  ALT 542*  ALKPHOS 173*  BILITOT 2.8*   PT/INR  Recent Labs  02/04/14 1542 02/05/14 0536  LABPROT 30.5* 33.2*  INR 2.90* 3.23*   Hepatitis Panel  Recent Labs  02/04/14 1049  HEPBSAG NEGATIVE  HCVAB NEGATIVE  HEPAIGM NON REACTIVE  HEPBIGM NON REACTIVE    Ct Abdomen Pelvis Wo Contrast  02/04/2014   CLINICAL DATA:  Weakness, abdominal pain and elevated LFTs  EXAM: CT ABDOMEN AND PELVIS WITHOUT CONTRAST  TECHNIQUE: Multidetector CT imaging of the abdomen and pelvis was performed following the standard protocol without IV  contrast.  COMPARISON:  11/18/2012  FINDINGS: Lung bases are well aerated bilaterally. Minimal right basilar atelectasis is seen.  The liver is within normal limits. The gallbladder is well distended. The spleen, adrenal glands and pancreas are within normal limits. The kidneys are well visualized bilaterally with mild perinephric stranding similar to that seen on prior CT examination. Some hypodensities are noted consistent with renal cysts also stable from the prior exam. No calculi or obstructive changes are seen. Aortoiliac calcifications are noted. The appendix is not well visualized although no inflammatory changes are seen. Diffuse aortoiliac calcifications are seen. The bladder is partially decompressed. No pelvic mass lesion is seen. No acute bony abnormality is noted. Degenerative anterolisthesis of L3 on L4 and L4 on L5 is noted.  IMPRESSION: Chronic changes as described above without acute abnormality.   Electronically Signed   By: Inez Catalina M.D.   On: 02/04/2014 14:45   US Abdomen Complete  02/05/2014   CLINICAL DATA:  Abnormal LFTs with nausea and epigastric pain.  EXAM: ULTRASOUND ABDOMEN COMPLETE; DUPLEX ULTRASOUND OF THE LIVER  COMPARISON:  Abdominal CT 02/12/2014  FINDINGS: Gallbladder: Gallbladder is mildly distended. The gallbladder wall is upper limits of normal measuring 3 mm. Reportedly, the patient does not have a sonographic Murphy's sign. No evidence for gallstones.  Common bile duct: Diameter: 5 mm.  Liver: No focal lesion identified. Within normal  limits in parenchymal echogenicity.  IVC: No abnormality visualized.  Pancreas: Visualized portion unremarkable.  Spleen: Size and appearance within normal limits. Spleen measures 7.0 cm in length.  Right Kidney: Length: 9.3 cm. Echogenicity within normal limits. No mass or hydronephrosis visualized.  Left Kidney: Length: 10.9 cm. Echogenicity within normal limits. No mass or hydronephrosis visualized.  Abdominal aorta: No aneurysm  visualized. The distal abdominal aorta is obscured by bowel gas.  Portal Vein Velocities  Main:  31 cm/sec  Right:  20 cm/sec  Left:  14 cm/sec  Hepatic Vein Velocities  Right:  22 cm/sec  Middle:  21 cm/sec  Left:  34 cm/sec  Hepatic Artery Velocity: 91 cm/sec  Splenic Vein Velocity: 15 cm/sec  Varices: None  Ascites: None  Other findings: Normal hepatopetal flow in the portal veins. Normal hepatofugal flow in the hepatic veins. No evidence for portal vein or splenic vein thrombus.  IMPRESSION: The gallbladder wall is upper limits of normal. No evidence for gallstones and the patient does not have a sonographic Murphy's sign.  Normal appearance of the liver with a patent portal venous system. Normal liver duplex examination.   Electronically Signed   By: Markus Daft M.D.   On: 02/05/2014 08:46   [place] Art/ven Flow Abd Pelv Doppler  02/05/2014   CLINICAL DATA:  Abnormal LFTs with nausea and epigastric pain.  EXAM: ULTRASOUND ABDOMEN COMPLETE; DUPLEX ULTRASOUND OF THE LIVER  COMPARISON:  Abdominal CT 02/12/2014  FINDINGS: Gallbladder: Gallbladder is mildly distended. The gallbladder wall is upper limits of normal measuring 3 mm. Reportedly, the patient does not have a sonographic Murphy's sign. No evidence for gallstones.  Common bile duct: Diameter: 5 mm.  Liver: No focal lesion identified. Within normal limits in parenchymal echogenicity.  IVC: No abnormality visualized.  Pancreas: Visualized portion unremarkable.  Spleen: Size and appearance within normal limits. Spleen measures 7.0 cm in length.  Right Kidney: Length: 9.3 cm. Echogenicity within normal limits. No mass or hydronephrosis visualized.  Left Kidney: Length: 10.9 cm. Echogenicity within normal limits. No mass or hydronephrosis visualized.  Abdominal aorta: No aneurysm visualized. The distal abdominal aorta is obscured by bowel gas.  Portal Vein Velocities  Main:  31 cm/sec  Right:  20 cm/sec  Left:  14 cm/sec  Hepatic Vein Velocities  Right:  22 cm/sec   Middle:  21 cm/sec  Left:  34 cm/sec  Hepatic Artery Velocity: 91 cm/sec  Splenic Vein Velocity: 15 cm/sec  Varices: None  Ascites: None  Other findings: Normal hepatopetal flow in the portal veins. Normal hepatofugal flow in the hepatic veins. No evidence for portal vein or splenic vein thrombus.  IMPRESSION: The gallbladder wall is upper limits of normal. No evidence for gallstones and the patient does not have a sonographic Murphy's sign.  Normal appearance of the liver with a patent portal venous system. Normal liver duplex examination.   Electronically Signed   By: Markus Daft M.D.   On: 02/05/2014 08:46   Dg Abd Acute W/chest  02/04/2014   CLINICAL DATA:  Nausea and vomiting.  Fever.  EXAM: ACUTE ABDOMEN SERIES (ABDOMEN 2 VIEW & CHEST 1 VIEW)  COMPARISON:  Chest radiograph 10/20/2013.  FINDINGS: Frontal view of the chest shows midline trachea. Heart is enlarged. Pacemaker lead tips project over the right atrium and right ventricle. Lungs are somewhat low in volume but grossly clear. No definite pleural fluid.  Views of the abdomen show stool in the majority of the colon. There are scattered air-fluid levels.  Gas is seen in the rectum. No definite small bowel dilatation. No free air.  IMPRESSION: 1. Bowel gas pattern is indicative of constipation. 2. No acute findings in the chest.   Electronically Signed   By: Lorin Picket M.D.   On: 02/04/2014 11:43    Assessment / Plan: *81 yo female admitted with hypotension, epigastric pain/RUQ, and an episode of vomiting.  Found to have markedly abnl LFTs and mild leukocytosis.  Pt appears to have a subacute hepatitis --? Toxin, infectious, ischemic/shock liver.  Acute viral hepatitis paneli si negative, acetaminphen level normal.  Ultrasound unremakrable as well.  LFT's trending down, much lower today.  Suspect possible shock liver with other evaluation negative.  Continue to trend LFT's.   LOS: 1 day   ZEHR, JESSICA D.  02/05/2014, 8:50 AM  Pager number  [phone]  GI ATTENDING  Interval history and data reviewed. Patient personally seen and examined. Multiple family members in room. The patient is feeling better. No particular complaints. Looks well. No evidence for obvious toxin or standard viral hepatitides. Standard ultrasound and Doppler studies unrevealing. No evidence for stones. Liver tests improved. Blood pressure improved. Continue with supportive measures and trend LFTs.  Docia Chuck. Geri Seminole., M.D. [facility] Division of Gastroenterology

## 2014-02-06 DIAGNOSIS — K759 Inflammatory liver disease, unspecified: Secondary | ICD-10-CM

## 2014-02-06 LAB — GLUCOSE, CAPILLARY
GLUCOSE-CAPILLARY: 145 mg/dL — AB (ref 70–99)
GLUCOSE-CAPILLARY: 204 mg/dL — AB (ref 70–99)
Glucose-Capillary: 115 mg/dL — ABNORMAL HIGH (ref 70–99)
Glucose-Capillary: 171 mg/dL — ABNORMAL HIGH (ref 70–99)

## 2014-02-06 LAB — COMPREHENSIVE METABOLIC PANEL
ALBUMIN: 2.9 g/dL — AB (ref 3.5–5.2)
ALK PHOS: 157 U/L — AB (ref 39–117)
ALT: 326 U/L — ABNORMAL HIGH (ref 0–35)
ANION GAP: 8 (ref 5–15)
AST: 333 U/L — ABNORMAL HIGH (ref 0–37)
BUN: 28 mg/dL — AB (ref 6–23)
CO2: 24 mmol/L (ref 19–32)
Calcium: 8.5 mg/dL (ref 8.4–10.5)
Chloride: 107 mEq/L (ref 96–112)
Creatinine, Ser: 1.44 mg/dL — ABNORMAL HIGH (ref 0.50–1.10)
GFR calc non Af Amer: 31 mL/min — ABNORMAL LOW (ref >90)
GFR, EST AFRICAN AMERICAN: 36 mL/min — AB (ref >90)
Glucose, Bld: 101 mg/dL — ABNORMAL HIGH (ref 70–99)
Potassium: 4.2 mmol/L (ref 3.5–5.1)
Sodium: 139 mmol/L (ref 135–145)
Total Bilirubin: 2.1 mg/dL — ABNORMAL HIGH (ref 0.3–1.2)
Total Protein: 5.8 g/dL — ABNORMAL LOW (ref 6.0–8.3)

## 2014-02-06 LAB — CBC
HCT: 34.6 % — ABNORMAL LOW (ref 36.0–46.0)
Hemoglobin: 10.8 g/dL — ABNORMAL LOW (ref 12.0–15.0)
MCH: 28.5 pg (ref 26.0–34.0)
MCHC: 31.2 g/dL (ref 30.0–36.0)
MCV: 91.3 fL (ref 78.0–100.0)
PLATELETS: 126 10*3/uL — AB (ref 150–400)
RBC: 3.79 MIL/uL — ABNORMAL LOW (ref 3.87–5.11)
RDW: 14.3 % (ref 11.5–15.5)
WBC: 7.5 10*3/uL (ref 4.0–10.5)

## 2014-02-06 LAB — PROTIME-INR
INR: 3.18 — AB (ref 0.00–1.49)
PROTHROMBIN TIME: 32.8 s — AB (ref 11.6–15.2)

## 2014-02-06 LAB — PROCALCITONIN: PROCALCITONIN: 39.27 ng/mL

## 2014-02-06 MED ORDER — POLYETHYLENE GLYCOL 3350 17 G PO PACK
17.0000 g | PACK | Freq: Every day | ORAL | Status: DC
  Filled 2014-02-06: qty 1

## 2014-02-06 MED ORDER — VITAMINS A & D EX OINT
TOPICAL_OINTMENT | CUTANEOUS | Status: AC
  Administered 2014-02-06: 05:00:00
  Filled 2014-02-06: qty 5

## 2014-02-06 MED ORDER — SENNOSIDES-DOCUSATE SODIUM 8.6-50 MG PO TABS
1.0000 | ORAL_TABLET | Freq: Two times a day (BID) | ORAL | Status: DC
  Administered 2014-02-07: 1 via ORAL
  Filled 2014-02-06 (×3): qty 1

## 2014-02-06 MED ORDER — ACETAMINOPHEN 325 MG PO TABS: 325.0000 mg | ORAL_TABLET | Freq: Four times a day (QID) | ORAL | Status: DC | PRN

## 2014-02-06 NOTE — Progress Notes (Signed)
[facility] Progress Note  Subjective:  Feels good.  Ready for some solid food.  Objective:  Vital signs in last 24 hours: Temp:  [98.1 F (36.7 C)-98.7 F (37.1 C)] 98.1 F (36.7 C) (01/22 0425) Pulse Rate:  [70-91] 77 (01/22 0425) Resp:  [15-18] 18 (01/22 0425) BP: (99-131)/(47-65) 131/61 mmHg (01/22 0425) SpO2:  [97 %-99 %] 99 % (01/22 0425) Weight:  [196 lb 3.4 oz (89 kg)-201 lb 8 oz (91.4 kg)] 201 lb 8 oz (91.4 kg) (01/22 0425) Last BM Date: 01/28/14 General:  Alert, Well-developed, in NAD Heart:  Regular rate and rhythm Pulm:  CTAB.  No W/R/R. Abdomen:  Soft, non-distended. Normal bowel sounds.  Non-tender. Extremities:  Without edema. Neurologic:  Alert and  oriented x4;  grossly normal neurologically. Psych:  Alert and cooperative. Normal mood and affect.  Intake/Output from previous day: 01/21 0701 - 01/22 0700 In: 1024.7 [P.O.:240; I.V.:784.7] Out: 750 [Urine:750]  Lab Results:  Recent Labs  02/04/14 1052 02/05/14 0536 02/06/14 0417  WBC 14.3* 14.6* 7.5  HGB 12.6 11.4* 10.8*  HCT 39.8 36.4 34.6*  PLT 133* 138* 126*   BMET  Recent Labs  02/04/14 1052 02/05/14 0536 02/06/14 0417  NA 136 136 139  K 3.5 4.1 4.2  CL 101 102 107  CO2 24 25 24   GLUCOSE 231* 134* 101*  BUN 23 28* 28*  CREATININE 1.75* 1.73* 1.44*  CALCIUM 9.0 8.3* 8.5   LFT  Recent Labs  02/06/14 0417  PROT 5.8*  ALBUMIN 2.9*  AST 333*  ALT 326*  ALKPHOS 157*  BILITOT 2.1*   PT/INR  Recent Labs  02/05/14 0536 02/06/14 0417  LABPROT 33.2* 32.8*  INR 3.23* 3.18*   Hepatitis Panel  Recent Labs  02/04/14 1049  HEPBSAG NEGATIVE  HCVAB NEGATIVE  HEPAIGM NON REACTIVE  HEPBIGM NON REACTIVE    Ct Abdomen Pelvis Wo Contrast  02/04/2014   CLINICAL DATA:  Weakness, abdominal pain and elevated LFTs  EXAM: CT ABDOMEN AND PELVIS WITHOUT CONTRAST  TECHNIQUE: Multidetector CT imaging of the abdomen and pelvis was performed following the standard protocol  without IV contrast.  COMPARISON:  11/18/2012  FINDINGS: Lung bases are well aerated bilaterally. Minimal right basilar atelectasis is seen.  The liver is within normal limits. The gallbladder is well distended. The spleen, adrenal glands and pancreas are within normal limits. The kidneys are well visualized bilaterally with mild perinephric stranding similar to that seen on prior CT examination. Some hypodensities are noted consistent with renal cysts also stable from the prior exam. No calculi or obstructive changes are seen. Aortoiliac calcifications are noted. The appendix is not well visualized although no inflammatory changes are seen. Diffuse aortoiliac calcifications are seen. The bladder is partially decompressed. No pelvic mass lesion is seen. No acute bony abnormality is noted. Degenerative anterolisthesis of L3 on L4 and L4 on L5 is noted.  IMPRESSION: Chronic changes as described above without acute abnormality.   Electronically Signed   By: Inez Catalina M.D.   On: 02/04/2014 14:45   US Abdomen Complete  02/05/2014   CLINICAL DATA:  Abnormal LFTs with nausea and epigastric pain.  EXAM: ULTRASOUND ABDOMEN COMPLETE; DUPLEX ULTRASOUND OF THE LIVER  COMPARISON:  Abdominal CT 02/12/2014  FINDINGS: Gallbladder: Gallbladder is mildly distended. The gallbladder wall is upper limits of normal measuring 3 mm. Reportedly, the patient does not have a sonographic Murphy's sign. No evidence for gallstones.  Common bile duct: Diameter: 5 mm.  Liver: No  focal lesion identified. Within normal limits in parenchymal echogenicity.  IVC: No abnormality visualized.  Pancreas: Visualized portion unremarkable.  Spleen: Size and appearance within normal limits. Spleen measures 7.0 cm in length.  Right Kidney: Length: 9.3 cm. Echogenicity within normal limits. No mass or hydronephrosis visualized.  Left Kidney: Length: 10.9 cm. Echogenicity within normal limits. No mass or hydronephrosis visualized.  Abdominal aorta: No  aneurysm visualized. The distal abdominal aorta is obscured by bowel gas.  Portal Vein Velocities  Main:  31 cm/sec  Right:  20 cm/sec  Left:  14 cm/sec  Hepatic Vein Velocities  Right:  22 cm/sec  Middle:  21 cm/sec  Left:  34 cm/sec  Hepatic Artery Velocity: 91 cm/sec  Splenic Vein Velocity: 15 cm/sec  Varices: None  Ascites: None  Other findings: Normal hepatopetal flow in the portal veins. Normal hepatofugal flow in the hepatic veins. No evidence for portal vein or splenic vein thrombus.  IMPRESSION: The gallbladder wall is upper limits of normal. No evidence for gallstones and the patient does not have a sonographic Murphy's sign.  Normal appearance of the liver with a patent portal venous system. Normal liver duplex examination.   Electronically Signed   By: Markus Daft M.D.   On: 02/05/2014 08:46   [place] Art/ven Flow Abd Pelv Doppler  02/05/2014   CLINICAL DATA:  Abnormal LFTs with nausea and epigastric pain.  EXAM: ULTRASOUND ABDOMEN COMPLETE; DUPLEX ULTRASOUND OF THE LIVER  COMPARISON:  Abdominal CT 02/12/2014  FINDINGS: Gallbladder: Gallbladder is mildly distended. The gallbladder wall is upper limits of normal measuring 3 mm. Reportedly, the patient does not have a sonographic Murphy's sign. No evidence for gallstones.  Common bile duct: Diameter: 5 mm.  Liver: No focal lesion identified. Within normal limits in parenchymal echogenicity.  IVC: No abnormality visualized.  Pancreas: Visualized portion unremarkable.  Spleen: Size and appearance within normal limits. Spleen measures 7.0 cm in length.  Right Kidney: Length: 9.3 cm. Echogenicity within normal limits. No mass or hydronephrosis visualized.  Left Kidney: Length: 10.9 cm. Echogenicity within normal limits. No mass or hydronephrosis visualized.  Abdominal aorta: No aneurysm visualized. The distal abdominal aorta is obscured by bowel gas.  Portal Vein Velocities  Main:  31 cm/sec  Right:  20 cm/sec  Left:  14 cm/sec  Hepatic Vein Velocities  Right:   22 cm/sec  Middle:  21 cm/sec  Left:  34 cm/sec  Hepatic Artery Velocity: 91 cm/sec  Splenic Vein Velocity: 15 cm/sec  Varices: None  Ascites: None  Other findings: Normal hepatopetal flow in the portal veins. Normal hepatofugal flow in the hepatic veins. No evidence for portal vein or splenic vein thrombus.  IMPRESSION: The gallbladder wall is upper limits of normal. No evidence for gallstones and the patient does not have a sonographic Murphy's sign.  Normal appearance of the liver with a patent portal venous system. Normal liver duplex examination.   Electronically Signed   By: Markus Daft M.D.   On: 02/05/2014 08:46   Dg Abd Acute W/chest  02/04/2014   CLINICAL DATA:  Nausea and vomiting.  Fever.  EXAM: ACUTE ABDOMEN SERIES (ABDOMEN 2 VIEW & CHEST 1 VIEW)  COMPARISON:  Chest radiograph 10/20/2013.  FINDINGS: Frontal view of the chest shows midline trachea. Heart is enlarged. Pacemaker lead tips project over the right atrium and right ventricle. Lungs are somewhat low in volume but grossly clear. No definite pleural fluid.  Views of the abdomen show stool in the majority of the colon.  There are scattered air-fluid levels. Gas is seen in the rectum. No definite small bowel dilatation. No free air.  IMPRESSION: 1. Bowel gas pattern is indicative of constipation. 2. No acute findings in the chest.   Electronically Signed   By: Lorin Picket M.D.   On: 02/04/2014 11:43    Assessment / Plan: *81 yo female admitted with hypotension, epigastric pain/RUQ, and an episode of vomiting. Found to have markedly abnl LFTs and mild leukocytosis.  Infectious vs ischemic/shock liver. Acute viral hepatitis paneli is negative, acetaminophen level normal. Ultrasound unremakrable as well. LFT's continue to trend down. Suspect possible shock liver with other evaluation negative. Continue to trend LFT's while she is here and then they will need followed by PCP as outpatient as well, but may take weeks to normalize  completely.  Ok to advance diet from GI standpoint (family says she eats low sodium diet at home).    LOS: 2 days   [name], Carrie D.  02/06/2014, 8:58 AM  Pager number [phone]   GI ATTENDING  Interval history due to reviewed. Patient personally seen and examined. Family in room. Sitting up, looking and feeling well. Exam benign. Liver tests continue to improve. Extensive workup unrevealing. Suspect that liver test abnormalities were secondary to shock liver with transient hypotension. Recommend ongoing supportive care. Her PCP can trend liver test as outpatient to assure ongoing improvement. We're available if needed. Will sign off.  Docia Chuck. Geri Seminole., M.D. [facility] Division of Gastroenterology

## 2014-02-06 NOTE — Progress Notes (Addendum)
[facility] PROGRESS NOTE  [name] ZOX:[id number] DOB: October 27, 1920 DOA: 02/04/2014 PCP: Carrie Sa [name], MD  Assessment/Plan: Active Problems:   Pancreatitis   Abnormal LFTs (liver function tests)   Epigastric pain    Hypotensive shock-resolved Mildly elevated but stable troponin secondary to Demand ischemia  Continue telemetry pt denies chest pain at this time but has significant CAD with pacemaker  Continue IVF, analgesia as needed CE stable,s spoke with cardiology on call,  2-D echo shows poor imaging with severely depressed wall motion abnormalities, will discuss findings with the daughter and see if she would like to do an echo with contrast to further define LVEF It also shows grade 1 diastolic dysfunction - pt not candidate for invasive interventions and per family PARTIAL CODE (OK with pressors only if needed) Continue metoprolol , aspirin    Transaminitis secondary to shock liver and transient hypotension This is probably secondary to worsening congestive heart failure Acute hepatitis panel, Tylenol level negative, Extensive workup unrevealing Normal ultrasound of the gallbladder, normal Doppler with patent portal venous system GI recommended supportive care, GI has signed off Follow CMP,  Acute encephalopathy - secondary to hypotension, dehydration, UTI Improving  Constipation Start the patient on aggressive constipation protocol, Senokot, miralax   Leukocytosis-improved/UTI Pan Culture no growth so far, discontinue Flagyl, continue Cipro for UTI, patient allergic to penicillin Pro calcitonin level significantly elevated   Acute renal failure-improving Baseline creatinine of about 1.2-1.3 - likely pre renal - IVF as noted above and repeat BMP in AM  Hx of A-fib and DVT - Coumadin per pharmacy   DM type II - hold Glipizide and place on SSI  AIC 7.9  Systolic and diastolic CHF - daily weights, strict I's and O's - hold Lasix  secondary to renal failure, resume Lasix when creatinine at baseline  Hypothyroidism - continue synthroid    Code Status: full Family Communication: family updated about patient's clinical progress, discussed with [name], Disposition Plan:  PT/OT evaluation POA [name]. He is the preferred contact person   Brief narrative: *[name] is a 81 yo Torres with a past medical history of CAD, CHF with EF of 40-45%,ms/p pacemaker placement for heart block, hx a-fib and hypertension. She has had a DVT and is maintained on coumadin.She is known to [name] for chronic constipation for many years. She lives at home and her children care for her. Tis moring, a neighbor who helps care for her was bathing the patient and noticed the patient was very sweaty. She took patients temp and it was 99. She took patients BP and systolic was in the 82N. She was brought to ER via EMS, where she was also noted to be hypotensive and was given fluids. Patients daughter says patient ate barbecued ribs last night and about an hour or so later developed abdominal pain and vomited. She felt better after she vomited, but today patient says she has some pain and points to the epigastric area. She feels weak and has waves of nausea but has not vomited today. She says her abdomen feels very full.She last had a BM 4 or 5 days ago per daughter.Pt denies CP or SOB. In ER, pt was noted to have elevated LFTs. Pt has not had any new meds.  Consultants:  Gastroenterology  Procedures:  *None  Antibiotics: Ciprofloxacin, Flagyl  HPI/Subjective: Denies any nausea vomiting, patient is constipated according to the daughter was present by the bedside, alert and oriented and  appears comfortable and hemodynamically stable  Objective: Filed Vitals:   02/05/14 1200 02/05/14 1600 02/05/14 2118 02/06/14 0425  BP:  110/53 99/65 131/61  Pulse:  77 70 77  Temp: 98.7 F (37.1 C) 98.3 F (36.8  C) 98.4 F (36.9 C) 98.1 F (36.7 C)  TempSrc: Oral Oral Oral Oral  Resp:  18 18 18   Height:  5\' 1"  (1.549 m)    Weight:  89 kg (196 lb 3.4 oz)  91.4 kg (201 lb 8 oz)  SpO2:  99% 98% 99%    Intake/Output Summary (Last 24 hours) at 02/06/14 1148 Last data filed at 02/06/14 0600  Gross per 24 hour  Intake 1024.67 ml  Output    750 ml  Net 274.67 ml    Exam:  General: alert & oriented x 3 In NAD  Cardiovascular: RRR, nl S1 s2  Respiratory: Decreased breath sounds at the bases, scattered rhonchi, no crackles  Abdomen: soft +BS NT/ND, no masses palpable  Extremities: No cyanosis and no edema      Data Reviewed: Basic Metabolic Panel:  Recent Labs Lab 02/04/14 1052 02/05/14 0536 02/06/14 0417  NA 136 136 139  K 3.5 4.1 4.2  CL 101 102 107  CO2 24 25 24   GLUCOSE 231* 134* 101*  BUN 23 28* 28*  CREATININE 1.75* 1.73* 1.44*  CALCIUM 9.0 8.3* 8.5    Liver Function Tests:  Recent Labs Lab 02/04/14 1052 02/05/14 0536 02/06/14 0417  AST 1916* 718* 333*  ALT 857* 542* 326*  ALKPHOS 217* 173* 157*  BILITOT 2.4* 2.8* 2.1*  PROT 6.6 6.1 5.8*  ALBUMIN 3.5 3.0* 2.9*    Recent Labs Lab 02/04/14 1052 02/05/14 0536  LIPASE 159* 26   No results for input(s): AMMONIA in the last 168 hours.  CBC:  Recent Labs Lab 02/04/14 1052 02/05/14 0536 02/06/14 0417  WBC 14.3* 14.6* 7.5  NEUTROABS 13.6*  --   --   HGB 12.6 11.4* 10.8*  HCT 39.8 36.4 34.6*  MCV 92.1 91.7 91.3  PLT 133* 138* 126*    Cardiac Enzymes:  Recent Labs Lab 02/04/14 1929 02/04/14 2327 02/05/14 0536 02/05/14 0832  CKTOTAL  --   --   --  57  TROPONINI 0.15* 0.12* 0.11*  --    BNP (last 3 results)  Recent Labs  02/14/13 1820  PROBNP 991.8*     CBG:  Recent Labs Lab 02/05/14 0741 02/05/14 1152 02/05/14 1650 02/05/14 2146 02/06/14 0853  GLUCAP 115* 147* 87 118* 115*    Recent Results (from the past 240 hour(s))  Urine culture     Status: None   Collection Time:  02/04/14 10:30 AM  Result Value Ref Range Status   Specimen Description URINE, CATHETERIZED  Final   Special Requests Normal  Final   Colony Count NO GROWTH Performed at [facility]   Final   Culture NO GROWTH Performed at [facility]   Final   Report Status 02/05/2014 FINAL  Final  Blood culture (routine x 2)     Status: None (Preliminary result)   Collection Time: 02/04/14 10:52 AM  Result Value Ref Range Status   Specimen Description BLOOD RIGHT HAND  Final   Special Requests   Final    BOTTLES DRAWN AEROBIC AND ANAEROBIC 2ML AER 1ML ANA   Culture   Final           BLOOD CULTURE RECEIVED NO GROWTH TO DATE CULTURE WILL BE HELD FOR 5 DAYS  BEFORE ISSUING A FINAL NEGATIVE REPORT Note: Culture results may be compromised due to an inadequate volume of blood received in culture bottles. Performed at [facility]    Report Status PENDING  Incomplete  Blood culture (routine x 2)     Status: None (Preliminary result)   Collection Time: 02/04/14 10:52 AM  Result Value Ref Range Status   Specimen Description BLOOD RIGHT FOREARM  Final   Special Requests BOTTLES DRAWN AEROBIC AND ANAEROBIC 5ML  Final   Culture   Final           BLOOD CULTURE RECEIVED NO GROWTH TO DATE CULTURE WILL BE HELD FOR 5 DAYS BEFORE ISSUING A FINAL NEGATIVE REPORT Performed at [facility]    Report Status PENDING  Incomplete  MRSA PCR Screening     Status: None   Collection Time: 02/04/14  6:08 PM  Result Value Ref Range Status   MRSA by PCR NEGATIVE NEGATIVE Final    Comment:        The GeneXpert MRSA Assay (FDA approved for NASAL specimens only), is one component of a comprehensive MRSA colonization surveillance program. It is not intended to diagnose MRSA infection nor to guide or monitor treatment for MRSA infections.   Urine culture     Status: None   Collection Time: 02/04/14  6:48 PM  Result Value Ref Range Status   Specimen Description URINE, CATHETERIZED   Final   Special Requests NONE  Final   Colony Count NO GROWTH Performed at [facility]   Final   Culture NO GROWTH Performed at [facility]   Final   Report Status 02/05/2014 FINAL  Final     Studies: Ct Abdomen Pelvis Wo Contrast  02/04/2014   CLINICAL DATA:  Weakness, abdominal pain and elevated LFTs  EXAM: CT ABDOMEN AND PELVIS WITHOUT CONTRAST  TECHNIQUE: Multidetector CT imaging of the abdomen and pelvis was performed following the standard protocol without IV contrast.  COMPARISON:  11/18/2012  FINDINGS: Lung bases are well aerated bilaterally. Minimal right basilar atelectasis is seen.  The liver is within normal limits. The gallbladder is well distended. The spleen, adrenal glands and pancreas are within normal limits. The kidneys are well visualized bilaterally with mild perinephric stranding similar to that seen on prior CT examination. Some hypodensities are noted consistent with renal cysts also stable from the prior exam. No calculi or obstructive changes are seen. Aortoiliac calcifications are noted. The appendix is not well visualized although no inflammatory changes are seen. Diffuse aortoiliac calcifications are seen. The bladder is partially decompressed. No pelvic mass lesion is seen. No acute bony abnormality is noted. Degenerative anterolisthesis of L3 on L4 and L4 on L5 is noted.  IMPRESSION: Chronic changes as described above without acute abnormality.   Electronically Signed   By: Inez Catalina M.D.   On: 02/04/2014 14:45   US Abdomen Complete  02/05/2014   CLINICAL DATA:  Abnormal LFTs with nausea and epigastric pain.  EXAM: ULTRASOUND ABDOMEN COMPLETE; DUPLEX ULTRASOUND OF THE LIVER  COMPARISON:  Abdominal CT 02/12/2014  FINDINGS: Gallbladder: Gallbladder is mildly distended. The gallbladder wall is upper limits of normal measuring 3 mm. Reportedly, the patient does not have a sonographic Murphy's sign. No evidence for gallstones.  Common bile duct:  Diameter: 5 mm.  Liver: No focal lesion identified. Within normal limits in parenchymal echogenicity.  IVC: No abnormality visualized.  Pancreas: Visualized portion unremarkable.  Spleen: Size and appearance within normal limits. Spleen measures  7.0 cm in length.  Right Kidney: Length: 9.3 cm. Echogenicity within normal limits. No mass or hydronephrosis visualized.  Left Kidney: Length: 10.9 cm. Echogenicity within normal limits. No mass or hydronephrosis visualized.  Abdominal aorta: No aneurysm visualized. The distal abdominal aorta is obscured by bowel gas.  Portal Vein Velocities  Main:  31 cm/sec  Right:  20 cm/sec  Left:  14 cm/sec  Hepatic Vein Velocities  Right:  22 cm/sec  Middle:  21 cm/sec  Left:  34 cm/sec  Hepatic Artery Velocity: 91 cm/sec  Splenic Vein Velocity: 15 cm/sec  Varices: None  Ascites: None  Other findings: Normal hepatopetal flow in the portal veins. Normal hepatofugal flow in the hepatic veins. No evidence for portal vein or splenic vein thrombus.  IMPRESSION: The gallbladder wall is upper limits of normal. No evidence for gallstones and the patient does not have a sonographic Murphy's sign.  Normal appearance of the liver with a patent portal venous system. Normal liver duplex examination.   Electronically Signed   By: Markus Daft M.D.   On: 02/05/2014 08:46   [place] Art/ven Flow Abd Pelv Doppler  02/05/2014   CLINICAL DATA:  Abnormal LFTs with nausea and epigastric pain.  EXAM: ULTRASOUND ABDOMEN COMPLETE; DUPLEX ULTRASOUND OF THE LIVER  COMPARISON:  Abdominal CT 02/12/2014  FINDINGS: Gallbladder: Gallbladder is mildly distended. The gallbladder wall is upper limits of normal measuring 3 mm. Reportedly, the patient does not have a sonographic Murphy's sign. No evidence for gallstones.  Common bile duct: Diameter: 5 mm.  Liver: No focal lesion identified. Within normal limits in parenchymal echogenicity.  IVC: No abnormality visualized.  Pancreas: Visualized portion unremarkable.  Spleen:  Size and appearance within normal limits. Spleen measures 7.0 cm in length.  Right Kidney: Length: 9.3 cm. Echogenicity within normal limits. No mass or hydronephrosis visualized.  Left Kidney: Length: 10.9 cm. Echogenicity within normal limits. No mass or hydronephrosis visualized.  Abdominal aorta: No aneurysm visualized. The distal abdominal aorta is obscured by bowel gas.  Portal Vein Velocities  Main:  31 cm/sec  Right:  20 cm/sec  Left:  14 cm/sec  Hepatic Vein Velocities  Right:  22 cm/sec  Middle:  21 cm/sec  Left:  34 cm/sec  Hepatic Artery Velocity: 91 cm/sec  Splenic Vein Velocity: 15 cm/sec  Varices: None  Ascites: None  Other findings: Normal hepatopetal flow in the portal veins. Normal hepatofugal flow in the hepatic veins. No evidence for portal vein or splenic vein thrombus.  IMPRESSION: The gallbladder wall is upper limits of normal. No evidence for gallstones and the patient does not have a sonographic Murphy's sign.  Normal appearance of the liver with a patent portal venous system. Normal liver duplex examination.   Electronically Signed   By: Markus Daft M.D.   On: 02/05/2014 08:46   Dg Abd Acute W/chest  02/04/2014   CLINICAL DATA:  Nausea and vomiting.  Fever.  EXAM: ACUTE ABDOMEN SERIES (ABDOMEN 2 VIEW & CHEST 1 VIEW)  COMPARISON:  Chest radiograph 10/20/2013.  FINDINGS: Frontal view of the chest shows midline trachea. Heart is enlarged. Pacemaker lead tips project over the right atrium and right ventricle. Lungs are somewhat low in volume but grossly clear. No definite pleural fluid.  Views of the abdomen show stool in the majority of the colon. There are scattered air-fluid levels. Gas is seen in the rectum. No definite small bowel dilatation. No free air.  IMPRESSION: 1. Bowel gas pattern is indicative of constipation. 2.  No acute findings in the chest.   Electronically Signed   By: Lorin Picket M.D.   On: 02/04/2014 11:43    Scheduled Meds: . aspirin  81 mg Oral Daily  .  ciprofloxacin  200 mg Intravenous Q12H  . insulin aspart  0-9 Units Subcutaneous TID WC  . levothyroxine  50 mcg Oral QAC breakfast  . metoprolol succinate  25 mg Oral Daily  . metronidazole  500 mg Intravenous Q8H  . tobramycin  1 drop Right Eye 4 times per day  . Warfarin - Pharmacist Dosing Inpatient   Does not apply q1800   Continuous Infusions: . sodium chloride 10 mL/hr at 02/05/14 2032    Active Problems:   Pancreatitis   Abnormal LFTs (liver function tests)   Epigastric pain    Time spent: 40 minutes   [facility] Pager [phone]. If 7PM-7AM, please contact night-coverage at [email or web address], password [facility] 02/06/2014, 11:48 AM  LOS: 2 days

## 2014-02-06 NOTE — Evaluation (Signed)
Physical Therapy Evaluation Patient Details Name: [name] MRN: [id number] DOB: Aug 05, 1920 Today's Date: 02/06/2014   History of Present Illness  pt came in with hypotension, dehydration, not with liver shock, and CHF, acute renal failure, and h/o pacemeaker with CAD  Clinical Impression  Pt with decreased strength, and decreased ability with mobility to benefit from PT to increase ability with mobility in order to increase safety and return home with family caring for pt.   .      Follow Up Recommendations Home health PT    Equipment Recommendations  None recommended by PT    Recommendations for Other Services       Precautions / Restrictions Precautions Precautions: ICD/Pacemaker      Mobility  Bed Mobility Overal bed mobility: +2 for physical assistance;Needs Assistance Bed Mobility: Supine to Sit;Sit to Supine     Supine to sit: Mod assist;+2 for physical assistance (for upper extermitya dn LE) Sit to supine: Mod assist (pt's son preformed sit to supine while helping with Upper bodya nd Lower body. )      Transfers Overall transfer level: Needs assistance Equipment used: Rolling walker (2 wheeled) Transfers: Sit to/from Stand Sit to Stand: Mod assist         General transfer comment: cues for leaning forward and rising up to RW . Was able to hold stand at [facility] for 10-15 seconds first time and only 5 seconds the second time.   Ambulation/Gait Ambulation/Gait assistance:  (unable and didn't push pt becaus eshe felt she could not breathe , pressure in chest area. )              Stairs            Wheelchair Mobility    Modified Rankin (Stroke Patients Only)       Balance                                             Pertinent Vitals/Pain Pain Assessment: No/denies pain (only stated she has some pain going down L arm whne sitting EOB )    Home Living Family/patient expects to be discharged to:: Private  residence Living Arrangements: Children (very knowledable and helpful and involved. (one is a Marine scientist )) Available Help at Discharge: Family;Available 24 hours/day Type of Home: House Home Access: Ramped entrance     Home Layout: One level Home Equipment: Walker - 2 wheels;Bedside commode;Wheelchair - manual;Hand held shower head;Adaptive equipment Additional Comments: Pt's wC can move through the house, and family very supportive.     Prior Function Level of Independence: Independent with assistive device(s)         Comments: Family assists her with most all ADLs and mobility at this time. Pt also reports taht she gets dizzy and has for some time with movement.      Hand Dominance        Extremity/Trunk Assessment   Upper Extremity Assessment: Defer to OT evaluation           Lower Extremity Assessment: Generalized weakness (and per pt and son present pt with swelling/fullness all over especially in belly and LEs. )         Communication   Communication: HOH;No difficulties (HOH in Left ear, speak on Right side if able and legally blind. )  Cognition Arousal/Alertness: Awake/alert Behavior During Therapy: WFL for tasks assessed/performed Overall  Cognitive Status: Within Functional Limits for tasks assessed                      General Comments      Exercises        Assessment/Plan    PT Assessment Patient needs continued PT services  PT Diagnosis Difficulty walking;Generalized weakness   PT Problem List Decreased strength;Decreased range of motion;Decreased activity tolerance;Decreased balance;Decreased mobility  PT Treatment Interventions DME instruction;Gait training;Functional mobility training;Therapeutic activities;Therapeutic exercise;Patient/family education   PT Goals (Current goals can be found in the Care Plan section) Acute Rehab PT Goals Patient Stated Goal: I want to be able to go home soon and gt up on my feet again.  PT Goal  Formulation: With patient/family Time For Goal Achievement: 02/20/14    Frequency Min 3X/week   Barriers to discharge        Co-evaluation               End of Session Equipment Utilized During Treatment: Gait belt Activity Tolerance: Patient tolerated treatment well (Limited by swelling and fullness feelign in chest and breathing for activity. ) Patient left: in bed;with family/visitor present Nurse Communication: Mobility status         Time: 8099-8338 PT Time Calculation (min) (ACUTE ONLY): 37 min   Charges:   PT Evaluation $Initial PT Evaluation Tier I: 1 Procedure PT Treatments $Therapeutic Activity: 8-22 mins   PT G CodesClide Dales 03-Mar-2014, 3:09 PM Clide Dales, PT Pager: [phone] 2014/03/03

## 2014-02-06 NOTE — Progress Notes (Signed)
During RN's assessment, patient had arm and leg swelling. Patient had not urinated very much during the day. Also, patient stated she had not had a bowel movement for a week and that she uses mag. Citrate at home. NP was notified and new orders were given to [facility] fluids and a one time order for mag. Citrate.

## 2014-02-06 NOTE — Progress Notes (Addendum)
ANTICOAGULATION CONSULT NOTE - Initial Consult  Pharmacy Consult for Warfarin Indication: atrial fibrillation  Allergies  Allergen Reactions  . Darvon     Unknown reaction   . Digoxin And Related Nausea Only  . Penicillins Other (See Comments)    Was told had allergy from childhood... Unknown reaction  . Percocet [Oxycodone-Acetaminophen] Other (See Comments)    confused  . Percodan [Oxycodone-Aspirin]   . Vicodin [Hydrocodone-Acetaminophen] Other (See Comments)    confused    Patient Measurements: Height: 5\' 1"  (154.9 cm) Weight: 201 lb 8 oz (91.4 kg) IBW/kg (Calculated) : 47.8  Vital Signs: Temp: 98.1 F (36.7 C) (01/22 0425) Temp Source: Oral (01/22 0425) BP: 131/61 mmHg (01/22 0425) Pulse Rate: 77 (01/22 0425)  Labs:  Recent Labs  02/04/14 1052 02/04/14 1542 02/04/14 1929 02/04/14 2327 02/05/14 0536 02/05/14 0832 02/06/14 0417  HGB 12.6  --   --   --  11.4*  --  10.8*  HCT 39.8  --   --   --  36.4  --  34.6*  PLT 133*  --   --   --  138*  --  126*  LABPROT  --  30.5*  --   --  33.2*  --  32.8*  INR  --  2.90*  --   --  3.23*  --  3.18*  CREATININE 1.75*  --   --   --  1.73*  --  1.44*  CKTOTAL  --   --   --   --   --  57  --   TROPONINI  --   --  0.15* 0.12* 0.11*  --   --     Estimated Creatinine Clearance: 26.2 mL/min (by C-G formula based on Cr of 1.44).    Medications:  Scheduled:  . aspirin  81 mg Oral Daily  . ciprofloxacin  200 mg Intravenous Q12H  . insulin aspart  0-9 Units Subcutaneous TID WC  . levothyroxine  50 mcg Oral QAC breakfast  . metoprolol succinate  25 mg Oral Daily  . metronidazole  500 mg Intravenous Q8H  . tobramycin  1 drop Right Eye 4 times per day  . Warfarin - Pharmacist Dosing Inpatient   Does not apply q1800    Assessment: 81 yo female Presents to ED with lethargy and hypotension. She is on chronic warfarin for afib. Home dose reported as 3.75mg  daily except 2.5mg  on Wed and Sun.  Last dose taken 1/19.  INR on  admit is therapeutic at 2.9.  Pharmacy is consulted to continue warfarin dosing inpatient.  Today, 02/06/2014   INR 3.18, supra-therapeutic despite no doses since admission  Likely d/t drug interactions (see below), elevated LFTs,  and decrease PO intake  CBC:  Hgb slightly decreased to 10.8, Plt stable at 126.  No bleeding reported.  Alk Phos, AST/ALT, lipase elevated on admission, but improved today.  Tbili improved  SCr remains elevated at 1.44 but improving  Diet: advanced to clear liquids 1/21  Drug-drug interactions:  Cipro/Flagyl will significantly increase the anticoagulant effects of warfarin. On ASA 81mg  for h/o CAD  Goal of Therapy:  INR 2-3   Plan:   No further warfarin today.    Daily PT/INR  Pharmacy to resume warfarin dosing when INR < 3, suspect will have reduced warfarin needs when appropriate to resume  Doreene Eland, PharmD, BCPS.   Pager: [phone] 02/06/2014 8:50 AM

## 2014-02-07 LAB — COMPREHENSIVE METABOLIC PANEL
ALT: 226 U/L — ABNORMAL HIGH (ref 0–35)
ANION GAP: 8 (ref 5–15)
AST: 174 U/L — ABNORMAL HIGH (ref 0–37)
Albumin: 2.8 g/dL — ABNORMAL LOW (ref 3.5–5.2)
Alkaline Phosphatase: 209 U/L — ABNORMAL HIGH (ref 39–117)
BUN: 20 mg/dL (ref 6–23)
CHLORIDE: 106 mmol/L (ref 96–112)
CO2: 25 mmol/L (ref 19–32)
Calcium: 8.8 mg/dL (ref 8.4–10.5)
Creatinine, Ser: 1.12 mg/dL — ABNORMAL HIGH (ref 0.50–1.10)
GFR calc non Af Amer: 42 mL/min — ABNORMAL LOW (ref >90)
GFR, EST AFRICAN AMERICAN: 48 mL/min — AB (ref >90)
GLUCOSE: 167 mg/dL — AB (ref 70–99)
POTASSIUM: 4.2 mmol/L (ref 3.5–5.1)
SODIUM: 139 mmol/L (ref 135–145)
Total Bilirubin: 1.7 mg/dL — ABNORMAL HIGH (ref 0.3–1.2)
Total Protein: 6 g/dL (ref 6.0–8.3)

## 2014-02-07 LAB — GLUCOSE, CAPILLARY
GLUCOSE-CAPILLARY: 169 mg/dL — AB (ref 70–99)
Glucose-Capillary: 153 mg/dL — ABNORMAL HIGH (ref 70–99)

## 2014-02-07 LAB — PROTIME-INR
INR: 2.69 — ABNORMAL HIGH (ref 0.00–1.49)
Prothrombin Time: 28.8 seconds — ABNORMAL HIGH (ref 11.6–15.2)

## 2014-02-07 MED ORDER — TRAMADOL HCL 50 MG PO TABS: 50.0000 mg | ORAL_TABLET | Freq: Four times a day (QID) | ORAL | Status: DC | PRN

## 2014-02-07 MED ORDER — SENNOSIDES-DOCUSATE SODIUM 8.6-50 MG PO TABS: 1.0000 | ORAL_TABLET | Freq: Two times a day (BID) | ORAL | Status: DC

## 2014-02-07 MED ORDER — FUROSEMIDE 40 MG PO TABS: 20.0000 mg | ORAL_TABLET | Freq: Every day | ORAL | Status: DC

## 2014-02-07 MED ORDER — FUROSEMIDE 40 MG PO TABS
40.0000 mg | ORAL_TABLET | Freq: Every day | ORAL | Status: DC
  Administered 2014-02-07: 40 mg via ORAL
  Filled 2014-02-07: qty 1

## 2014-02-07 MED ORDER — WARFARIN SODIUM 2.5 MG PO TABS: ORAL_TABLET | ORAL | Status: DC

## 2014-02-07 MED ORDER — METOPROLOL TARTRATE 25 MG PO TABS: 25.0000 mg | ORAL_TABLET | Freq: Two times a day (BID) | ORAL | Status: DC

## 2014-02-07 NOTE — Care Management Note (Signed)
    Page 1 of 1   02/07/2014     1:26:50 PM CARE MANAGEMENT NOTE 02/07/2014  Patient:  [name], [name]   Account Number:  [id number]  Date Initiated:  02/07/2014  Documentation initiated by:  Dessa Phi  Subjective/Objective Assessment:   81 y/o f admitted w/Pancreatitis.     Action/Plan:   From home w/family.   Anticipated DC Date:  02/07/2014   Anticipated DC Plan:  [place]  CM consult      Choice offered to / List presented to:  C-4 Adult Children        HH arranged  HH-1 RN  [place] agency  [facility]   Status of service:  Completed, signed off Medicare Important Message given?  YES (If response is "NO", the following Medicare IM given date fields will be blank) Date Medicare IM given:  02/07/2014 Medicare IM given by:  [facility] Date Additional Medicare IM given:   Additional Medicare IM given by:    Discharge Disposition:  [place]  Per UR Regulation:  Reviewed for med. necessity/level of care/duration of stay  If discussed at [place] of Stay Meetings, dates discussed:    Comments:  02/07/14 Dessa Phi RN BSN NCM [phone] Spoke to dtr-Joyce c#[phone] on phone, chose [facility]-used them in past, explained that [facility] will call patient's home on [address] already familiar with patient-spoke to Clarise Cruz rep for [facility]-office closed today & will re open on Monday-they will contact patient.Faxed w/confirmation to [facility]-h&p,d/c summary,face sheet,hhc order,face to face-fax#[phone].Dtr voiced understanding.Dtr asked about raised toilet seat-explained that no dme order for raised toilet seat,also not recommended, & it is an out of pocket expense. Recommended to check w/pcp office on monday.Dtr voiced understanding.

## 2014-02-07 NOTE — Discharge Summary (Signed)
Physician Discharge Summary  [name] MRN: [id number] DOB/AGE: Dec 28, 1920 81 y.o.  PCP: Kevan Ny, MD   Admit date: 02/04/2014 Discharge date: 02/07/2014  Discharge Diagnoses:  Shock liver UTI Acute toxic encephalopathy   Pancreatitis   Abnormal LFTs (liver function tests)   Epigastric pain Acute constipation Acute prerenal renal failure Chronic systolic/diastolic heart failure without exacerbation   Follow-up recommendations follow-up with PCP in 5-7 days Follow-up INR 1/25 Resume Coumadin 1/25 Follow-up with cardiology for management of heart failure  Follow-up CBC, CMP in 3-5 days     Medication List    STOP taking these medications        acetaminophen 500 MG tablet  Commonly known as:  TYLENOL     acetaminophen-codeine 300-30 MG per tablet  Commonly known as:  TYLENOL #3     Linaclotide 145 MCG Caps capsule  Commonly known as:  LINZESS      TAKE these medications        ACCU-CHEK AVIVA PLUS test strip  Generic drug:  glucose blood  Use to check blood sugars 2 times daily     ACCU-CHEK AVIVA PLUS W/DEVICE Kit  Use to check blood sugar 2 times per day dx code 250.00     ACCU-CHEK FASTCLIX LANCET Kit  Use as directec     accu-chek multiclix lancets  Use as instructed to check blood sugar 2 times per day dx code 250.00     aspirin 81 MG chewable tablet  Chew 81 mg by mouth daily.     atropine 1 % ophthalmic solution  Place 1 drop into the right eye 4 (four) times daily.     cholecalciferol 1000 UNITS tablet  Commonly known as:  VITAMIN D  Take 1,000 Units by mouth daily.     feeding supplement (GLUCERNA SHAKE) Liqd  Take 237 mLs by mouth 2 (two) times daily between meals.     folic acid 1 MG tablet  Commonly known as:  FOLVITE  Take 1 tablet (1 mg total) by mouth daily.     furosemide 40 MG tablet  Commonly known as:  LASIX  Take 0.5 tablets (20 mg total) by mouth daily.     gabapentin 100 MG capsule  Commonly known  as:  NEURONTIN  Take 100 mg by mouth 2 (two) times daily.     glipiZIDE 5 MG tablet  Commonly known as:  GLUCOTROL  Take one-half tablet by  mouth daily only if blood  sugar is over 200     guaiFENesin 600 MG 12 hr tablet  Commonly known as:  MUCINEX  Take 600 mg by mouth 2 (two) times daily.     hydrocortisone 25 MG suppository  Commonly known as:  ANUSOL-HC  Use 1 suppository rectally at bedtime for 7 days.     Hyoscyamine Sulfate 0.375 MG Tbcr  A1 tab twice a day as needed, abdominal pain     levothyroxine 50 MCG tablet  Commonly known as:  SYNTHROID, LEVOTHROID  Take 1 tablet (50 mcg total) by mouth daily before breakfast.     loratadine 10 MG tablet  Commonly known as:  CLARITIN  Take 10 mg by mouth daily.     metoprolol tartrate 25 MG tablet  Commonly known as:  LOPRESSOR  Take 1 tablet (25 mg total) by mouth 2 (two) times daily.     nitroGLYCERIN 0.4 mg/hr patch  Commonly known as:  NITRODUR - Dosed in mg/24 hr  Place 1 patch (0.4  mg total) onto the skin daily.     omeprazole 20 MG capsule  Commonly known as:  PRILOSEC  Take 1 capsule (20 mg total) by mouth daily.     potassium chloride SA 20 MEQ tablet  Commonly known as:  K-DUR,KLOR-CON  Take 20 mEq by mouth every other day.     potassium chloride SA 20 MEQ tablet  Commonly known as:  K-DUR,KLOR-CON  take 1 tablet by mouth once daily for 3 days  WITH FUROSEMIDE THEN AS DIRECTED     REFRESH OP  Apply 1 drop to eye daily.     rosuvastatin 10 MG tablet  Commonly known as:  CRESTOR  Take 10 mg by mouth at bedtime.     senna-docusate 8.6-50 MG per tablet  Commonly known as:  Senokot-S  Take 1 tablet by mouth 2 (two) times daily.     tobramycin 0.3 % ophthalmic solution  Commonly known as:  TOBREX  Place 1 drop into the right eye daily as needed.     traMADol 50 MG tablet  Commonly known as:  ULTRAM  Take 1 tablet (50 mg total) by mouth every 6 (six) hours as needed for moderate pain.     Vitamin D  (Ergocalciferol) 50000 UNITS Caps capsule  Commonly known as:  DRISDOL  Take 50,000 Units by mouth daily.     warfarin 2.5 MG tablet  Commonly known as:  COUMADIN  Take 1 to 1.5 tablets by mouth daily as directed by coumadin clinic  Start taking on:  02/09/2014     zolpidem 10 MG tablet  Commonly known as:  AMBIEN  Take 5 mg by mouth at bedtime as needed. sleep        Discharge Condition: Home health PT   Disposition: 01-Home or Self Care   Consults:  GI    Significant Diagnostic Studies: Ct Abdomen Pelvis Wo Contrast  02/04/2014   CLINICAL DATA:  Weakness, abdominal pain and elevated LFTs  EXAM: CT ABDOMEN AND PELVIS WITHOUT CONTRAST  TECHNIQUE: Multidetector CT imaging of the abdomen and pelvis was performed following the standard protocol without IV contrast.  COMPARISON:  11/18/2012  FINDINGS: Lung bases are well aerated bilaterally. Minimal right basilar atelectasis is seen.  The liver is within normal limits. The gallbladder is well distended. The spleen, adrenal glands and pancreas are within normal limits. The kidneys are well visualized bilaterally with mild perinephric stranding similar to that seen on prior CT examination. Some hypodensities are noted consistent with renal cysts also stable from the prior exam. No calculi or obstructive changes are seen. Aortoiliac calcifications are noted. The appendix is not well visualized although no inflammatory changes are seen. Diffuse aortoiliac calcifications are seen. The bladder is partially decompressed. No pelvic mass lesion is seen. No acute bony abnormality is noted. Degenerative anterolisthesis of L3 on L4 and L4 on L5 is noted.  IMPRESSION: Chronic changes as described above without acute abnormality.   Electronically Signed   By: Inez Catalina M.D.   On: 02/04/2014 14:45   US Abdomen Complete  02/05/2014   CLINICAL DATA:  Abnormal LFTs with nausea and epigastric pain.  EXAM: ULTRASOUND ABDOMEN COMPLETE; DUPLEX ULTRASOUND OF  THE LIVER  COMPARISON:  Abdominal CT 02/12/2014  FINDINGS: Gallbladder: Gallbladder is mildly distended. The gallbladder wall is upper limits of normal measuring 3 mm. Reportedly, the patient does not have a sonographic Murphy's sign. No evidence for gallstones.  Common bile duct: Diameter: 5 mm.  Liver: No focal lesion  identified. Within normal limits in parenchymal echogenicity.  IVC: No abnormality visualized.  Pancreas: Visualized portion unremarkable.  Spleen: Size and appearance within normal limits. Spleen measures 7.0 cm in length.  Right Kidney: Length: 9.3 cm. Echogenicity within normal limits. No mass or hydronephrosis visualized.  Left Kidney: Length: 10.9 cm. Echogenicity within normal limits. No mass or hydronephrosis visualized.  Abdominal aorta: No aneurysm visualized. The distal abdominal aorta is obscured by bowel gas.  Portal Vein Velocities  Main:  31 cm/sec  Right:  20 cm/sec  Left:  14 cm/sec  Hepatic Vein Velocities  Right:  22 cm/sec  Middle:  21 cm/sec  Left:  34 cm/sec  Hepatic Artery Velocity: 91 cm/sec  Splenic Vein Velocity: 15 cm/sec  Varices: None  Ascites: None  Other findings: Normal hepatopetal flow in the portal veins. Normal hepatofugal flow in the hepatic veins. No evidence for portal vein or splenic vein thrombus.  IMPRESSION: The gallbladder wall is upper limits of normal. No evidence for gallstones and the patient does not have a sonographic Murphy's sign.  Normal appearance of the liver with a patent portal venous system. Normal liver duplex examination.   Electronically Signed   By: Markus Daft M.D.   On: 02/05/2014 08:46   [place] Art/ven Flow Abd Pelv Doppler  02/05/2014   CLINICAL DATA:  Abnormal LFTs with nausea and epigastric pain.  EXAM: ULTRASOUND ABDOMEN COMPLETE; DUPLEX ULTRASOUND OF THE LIVER  COMPARISON:  Abdominal CT 02/12/2014  FINDINGS: Gallbladder: Gallbladder is mildly distended. The gallbladder wall is upper limits of normal measuring 3 mm. Reportedly, the  patient does not have a sonographic Murphy's sign. No evidence for gallstones.  Common bile duct: Diameter: 5 mm.  Liver: No focal lesion identified. Within normal limits in parenchymal echogenicity.  IVC: No abnormality visualized.  Pancreas: Visualized portion unremarkable.  Spleen: Size and appearance within normal limits. Spleen measures 7.0 cm in length.  Right Kidney: Length: 9.3 cm. Echogenicity within normal limits. No mass or hydronephrosis visualized.  Left Kidney: Length: 10.9 cm. Echogenicity within normal limits. No mass or hydronephrosis visualized.  Abdominal aorta: No aneurysm visualized. The distal abdominal aorta is obscured by bowel gas.  Portal Vein Velocities  Main:  31 cm/sec  Right:  20 cm/sec  Left:  14 cm/sec  Hepatic Vein Velocities  Right:  22 cm/sec  Middle:  21 cm/sec  Left:  34 cm/sec  Hepatic Artery Velocity: 91 cm/sec  Splenic Vein Velocity: 15 cm/sec  Varices: None  Ascites: None  Other findings: Normal hepatopetal flow in the portal veins. Normal hepatofugal flow in the hepatic veins. No evidence for portal vein or splenic vein thrombus.  IMPRESSION: The gallbladder wall is upper limits of normal. No evidence for gallstones and the patient does not have a sonographic Murphy's sign.  Normal appearance of the liver with a patent portal venous system. Normal liver duplex examination.   Electronically Signed   By: Markus Daft M.D.   On: 02/05/2014 08:46   Dg Abd Acute W/chest  02/04/2014   CLINICAL DATA:  Nausea and vomiting.  Fever.  EXAM: ACUTE ABDOMEN SERIES (ABDOMEN 2 VIEW & CHEST 1 VIEW)  COMPARISON:  Chest radiograph 10/20/2013.  FINDINGS: Frontal view of the chest shows midline trachea. Heart is enlarged. Pacemaker lead tips project over the right atrium and right ventricle. Lungs are somewhat low in volume but grossly clear. No definite pleural fluid.  Views of the abdomen show stool in the majority of the colon. There are scattered  air-fluid levels. Gas is seen in the rectum.  No definite small bowel dilatation. No free air.  IMPRESSION: 1. Bowel gas pattern is indicative of constipation. 2. No acute findings in the chest.   Electronically Signed   By: Lorin Picket M.D.   On: 02/04/2014 11:43    2-D echo Left ventricle: Poor acoiustic windows limit study. LVEF is at least moderate to severely depressed with hypokinesis of the lateral, apical, basal inferior walls The basal posterior wall appears akinetice. Anterior wall is not well seen WOuld recomm limited echo with echocontrast to further define LVEF. and wall motion. The cavity size was normal. Wall thickness was normal. Doppler parameters are consistent with abnormal left ventricular relaxation (grade 1 diastolic dysfunction). - Pericardium, extracardiac: A trivial pericardial effusion was identified.  Microbiology: Recent Results (from the past 240 hour(s))  Urine culture     Status: None   Collection Time: 02/04/14 10:30 AM  Result Value Ref Range Status   Specimen Description URINE, CATHETERIZED  Final   Special Requests Normal  Final   Colony Count NO GROWTH Performed at [facility]   Final   Culture NO GROWTH Performed at [facility]   Final   Report Status 02/05/2014 FINAL  Final  Blood culture (routine x 2)     Status: None (Preliminary result)   Collection Time: 02/04/14 10:52 AM  Result Value Ref Range Status   Specimen Description BLOOD RIGHT HAND  Final   Special Requests   Final    BOTTLES DRAWN AEROBIC AND ANAEROBIC 2ML AER 1ML ANA   Culture   Final           BLOOD CULTURE RECEIVED NO GROWTH TO DATE CULTURE WILL BE HELD FOR 5 DAYS BEFORE ISSUING A FINAL NEGATIVE REPORT Note: Culture results may be compromised due to an inadequate volume of blood received in culture bottles. Performed at [facility]    Report Status PENDING  Incomplete  Blood culture (routine x 2)     Status: None (Preliminary result)   Collection Time: 02/04/14 10:52  AM  Result Value Ref Range Status   Specimen Description BLOOD RIGHT FOREARM  Final   Special Requests BOTTLES DRAWN AEROBIC AND ANAEROBIC 5ML  Final   Culture   Final           BLOOD CULTURE RECEIVED NO GROWTH TO DATE CULTURE WILL BE HELD FOR 5 DAYS BEFORE ISSUING A FINAL NEGATIVE REPORT Performed at [facility]    Report Status PENDING  Incomplete  MRSA PCR Screening     Status: None   Collection Time: 02/04/14  6:08 PM  Result Value Ref Range Status   MRSA by PCR NEGATIVE NEGATIVE Final    Comment:        The GeneXpert MRSA Assay (FDA approved for NASAL specimens only), is one component of a comprehensive MRSA colonization surveillance program. It is not intended to diagnose MRSA infection nor to guide or monitor treatment for MRSA infections.   Urine culture     Status: None   Collection Time: 02/04/14  6:48 PM  Result Value Ref Range Status   Specimen Description URINE, CATHETERIZED  Final   Special Requests NONE  Final   Colony Count NO GROWTH Performed at [facility]   Final   Culture NO GROWTH Performed at [facility]   Final   Report Status 02/05/2014 FINAL  Final     Labs: Results for orders placed or performed  during the hospital encounter of 02/04/14 (from the past 48 hour(s))  Glucose, capillary     Status: Abnormal   Collection Time: 02/05/14 11:52 AM  Result Value Ref Range   Glucose-Capillary 147 (H) 70 - 99 mg/dL  Glucose, capillary     Status: None   Collection Time: 02/05/14  4:50 PM  Result Value Ref Range   Glucose-Capillary 87 70 - 99 mg/dL  Glucose, capillary     Status: Abnormal   Collection Time: 02/05/14  9:46 PM  Result Value Ref Range   Glucose-Capillary 118 (H) 70 - 99 mg/dL  Protime-INR     Status: Abnormal   Collection Time: 02/06/14  4:17 AM  Result Value Ref Range   Prothrombin Time 32.8 (H) 11.6 - 15.2 seconds   INR 3.18 (H) 0.00 - 1.49  Procalcitonin     Status: None   Collection Time: 02/06/14   4:17 AM  Result Value Ref Range   Procalcitonin 39.27 ng/mL    Comment:        Interpretation: PCT >= 10 ng/mL: Important systemic inflammatory response, almost exclusively due to severe bacterial sepsis or septic shock. (NOTE)         ICU PCT Algorithm               Non ICU PCT Algorithm    ----------------------------     ------------------------------         PCT < 0.25 ng/mL                 PCT < 0.1 ng/mL     Stopping of antibiotics            Stopping of antibiotics       strongly encouraged.               strongly encouraged.    ----------------------------     ------------------------------       PCT level decrease by               PCT < 0.25 ng/mL       >= 80% from peak PCT       OR PCT 0.25 - 0.5 ng/mL          Stopping of antibiotics                                             encouraged.     Stopping of antibiotics           encouraged.    ----------------------------     ------------------------------       PCT level decrease by              PCT >= 0.25 ng/mL       < 80% from peak PCT        AND PCT >= 0.5 ng/mL             Continuing antibiotics                                              encouraged.       Continuing antibiotics            encouraged.    ----------------------------     ------------------------------  PCT level increase compared          PCT > 0.5 ng/mL         with peak PCT AND          PCT >= 0.5 ng/mL             Escalation of antibiotics                                          strongly encouraged.      Escalation of antibiotics        strongly encouraged.   Comprehensive metabolic panel     Status: Abnormal   Collection Time: 02/06/14  4:17 AM  Result Value Ref Range   Sodium 139 135 - 145 mmol/L    Comment: Please note change in reference range.   Potassium 4.2 3.5 - 5.1 mmol/L    Comment: Please note change in reference range.   Chloride 107 96 - 112 mEq/L   CO2 24 19 - 32 mmol/L   Glucose, Bld 101 (H) 70 - 99 mg/dL   BUN 28  (H) 6 - 23 mg/dL   Creatinine, Ser 1.44 (H) 0.50 - 1.10 mg/dL   Calcium 8.5 8.4 - 10.5 mg/dL   Total Protein 5.8 (L) 6.0 - 8.3 g/dL   Albumin 2.9 (L) 3.5 - 5.2 g/dL   AST 333 (H) 0 - 37 U/L   ALT 326 (H) 0 - 35 U/L   Alkaline Phosphatase 157 (H) 39 - 117 U/L   Total Bilirubin 2.1 (H) 0.3 - 1.2 mg/dL   GFR calc non Af Amer 31 (L) >90 mL/min   GFR calc Af Amer 36 (L) >90 mL/min    Comment: (NOTE) The eGFR has been calculated using the CKD EPI equation. This calculation has not been validated in all clinical situations. eGFR's persistently <90 mL/min signify possible Chronic Kidney Disease.    Anion gap 8 5 - 15  CBC     Status: Abnormal   Collection Time: 02/06/14  4:17 AM  Result Value Ref Range   WBC 7.5 4.0 - 10.5 K/uL   RBC 3.79 (L) 3.87 - 5.11 MIL/uL   Hemoglobin 10.8 (L) 12.0 - 15.0 g/dL   HCT 34.6 (L) 36.0 - 46.0 %   MCV 91.3 78.0 - 100.0 fL   MCH 28.5 26.0 - 34.0 pg   MCHC 31.2 30.0 - 36.0 g/dL   RDW 14.3 11.5 - 15.5 %   Platelets 126 (L) 150 - 400 K/uL  Glucose, capillary     Status: Abnormal   Collection Time: 02/06/14  8:53 AM  Result Value Ref Range   Glucose-Capillary 115 (H) 70 - 99 mg/dL  Glucose, capillary     Status: Abnormal   Collection Time: 02/06/14 11:43 AM  Result Value Ref Range   Glucose-Capillary 145 (H) 70 - 99 mg/dL  Glucose, capillary     Status: Abnormal   Collection Time: 02/06/14  5:29 PM  Result Value Ref Range   Glucose-Capillary 171 (H) 70 - 99 mg/dL  Glucose, capillary     Status: Abnormal   Collection Time: 02/06/14 10:14 PM  Result Value Ref Range   Glucose-Capillary 204 (H) 70 - 99 mg/dL  Protime-INR     Status: Abnormal   Collection Time: 02/07/14  5:51 AM  Result Value Ref Range   Prothrombin Time 28.8 (H) 11.6 -  15.2 seconds   INR 2.69 (H) 0.00 - 1.49  Comprehensive metabolic panel     Status: Abnormal   Collection Time: 02/07/14  5:51 AM  Result Value Ref Range   Sodium 139 135 - 145 mmol/L   Potassium 4.2 3.5 - 5.1  mmol/L   Chloride 106 96 - 112 mmol/L   CO2 25 19 - 32 mmol/L   Glucose, Bld 167 (H) 70 - 99 mg/dL   BUN 20 6 - 23 mg/dL   Creatinine, Ser 1.12 (H) 0.50 - 1.10 mg/dL   Calcium 8.8 8.4 - 10.5 mg/dL   Total Protein 6.0 6.0 - 8.3 g/dL   Albumin 2.8 (L) 3.5 - 5.2 g/dL   AST 174 (H) 0 - 37 U/L   ALT 226 (H) 0 - 35 U/L   Alkaline Phosphatase 209 (H) 39 - 117 U/L   Total Bilirubin 1.7 (H) 0.3 - 1.2 mg/dL   GFR calc non Af Amer 42 (L) >90 mL/min   GFR calc Af Amer 48 (L) >90 mL/min    Comment: (NOTE) The eGFR has been calculated using the CKD EPI equation. This calculation has not been validated in all clinical situations. eGFR's persistently <90 mL/min signify possible Chronic Kidney Disease.    Anion gap 8 5 - 15  Glucose, capillary     Status: Abnormal   Collection Time: 02/07/14  7:27 AM  Result Value Ref Range   Glucose-Capillary 153 (H) 70 - 99 mg/dL     HPI :[name] is a 81 y.o. female is a 81 yo female with a past medical history of CAD, CHF with EF of 40-45%,ms/p pacemaker placement for heart block, hx a-fib and hypertension. She has had a DVT and is maintained on coumadin.She is known to Dr Deatra Ina for chronic constipation for many years. She lives at home and her children care for her. Tis moring, a neighbor who helps care for her was bathing the patient and noticed the patient was very sweaty. She took patients temp and it was 99. She took patients BP and systolic was in the 46K. She was brought to ER via EMS, where she was also noted to be hypotensive and was given fluids. Patients daughter says patient ate barbecued ribs last night and about an hour or so later developed abdominal pain and vomited. She felt better after she vomited, but today patient says she has some pain and points to the epigastric area. She feels weak and has waves of nausea but has not vomited today. She says her abdomen feels very full.She last had a BM 4 or 5 days ago per daughter.Pt denies CP  or SOB. In ER, pt was noted to have elevated LFTs. AST greater than 1900, ALT 857. Patient does take Tylenol with Codeine every 8 hours for chronic pain    HOSPITAL COURSE: Hypotensive shock-resolved, could be in the setting of patient UTI, worsening EF Mildly elevated but stable troponin secondary to Demand ischemia  Cardiac enzymes mildly elevated, initial troponin 0.13 but stayed at the same level 4 draws 2-D echo shows poor imaging with severely depressed wall motion abnormalities, and possible worsening of her ejection fraction, discussed findings with daughter and she doesn't want to pursue any aggressive inpatient workup She is okay with outpatient cardiology follow-up - pt not candidate for invasive interventions and per family PARTIAL CODE (OK with pressors only if needed) Continue metoprolol , aspirin    Transaminitis secondary to shock liver and transient hypotension This is  probably secondary to worsening congestive heart failure Acute hepatitis panel, Tylenol level negative, Extensive workup unrevealing Normal ultrasound of the gallbladder, normal Doppler with patent portal venous system Liver function now improving, follow CMP intermittently Discontinued Tylenol with Codeine and switched to tramadol for chronic pain GI recommended supportive care, GI has signed off     Acute encephalopathy - secondary to hypotension, dehydration, UTI Resolved, now at baseline   Constipation-resolved Continue constipation protocol, Senokot, miralax   Leukocytosis secondary to UTI Pan Culture no growth so far,  Ciprofloxacin 3 days given Urine culture no growth so far, therefore will DC antibiotics   Acute renal failure-improving Baseline creatinine of about 1.2-1.3 - likely pre renal Patient received IV hydration with improvement in her renal function, creatinine 1.1 to prior to discharge Family insisted that the patient looked more swollen and requested Lasix to be  restarted Lasix 40 mg by mouth 1 given in the hospital Patient to continue with Lasix 20 mg by mouth daily Close follow-up of outpatient renal function  Hx of A-fib and DVT - Coumadin per pharmacy  Patient has not received Coumadin since 1/20 and her INR continues to be therapeutic, I suspect due to shock liver Would recommend to repeat INR 1/25 and resume home dose of Coumadin   DM type II Resume home regimen of glipizide AIC 7.9   Chronic Systolic and diastolic CHF - daily weights, strict I's and O's Resume Lasix  Not a candidate for ACE inhibitor secondary to risk of renal failure Patient to follow-up with her regular cardiologist in the outpatient setting   Hypothyroidism - continue synthroid    Code Status: full  Discharge Exam: Blood pressure 127/64, pulse 65, temperature 98.1 F (36.7 C), temperature source Oral, resp. rate 20, height _0  (1.549 m), weight 90 kg (198 lb 6.6 oz), SpO2 99 %.  General: Alert, Well-developed, in NAD Heart: Regular rate and rhythm Pulm: CTAB. No W/R/R. Abdomen: Soft, non-distended. Normal bowel sounds. Non-tender. Extremities: Without edema. Neurologic: Alert and oriented x4; grossly normal neurologically. Psych: Alert and cooperative. Normal mood and affect       Discharge Instructions    Diet - low sodium heart healthy    Complete by:  As directed      Increase activity slowly    Complete by:  As directed            Follow-up Information    Follow up with Marlou Sa, [name], MD. Schedule an appointment as soon as possible for a visit in 1 week.   Specialty:  Internal Medicine   Contact information:   [place] [address] [phone]       Follow up with Sanda Klein, MD. Schedule an appointment as soon as possible for a visit in 1 week.   Specialty:  Cardiology   Why:  Heart failure   Contact information:   [address] [place] [address] [phone]        Signed: Reyne Dumas 02/07/2014, 11:16 AM

## 2014-02-07 NOTE — Evaluation (Signed)
Occupational Therapy Evaluation Patient Details Name: [name] MRN: [id number] DOB: 18-Jan-1920 Today's Date: 02/07/2014    History of Present Illness pt came in with hypotension, dehydration, not with liver shock, and CHF, acute renal failure, and h/o pacemeaker with CAD   Clinical Impression   This 25 year year old female was admitted for the above.   At baseline, family assists with all adls.  Pt does assist with sit to stand and SPTs.  Goals in acute focus on these 2 areas; goals are set at min A level.    Follow Up Recommendations  Supervision/Assistance - 24 hour    Equipment Recommendations  None recommended by OT    Recommendations for Other Services       Precautions / Restrictions Precautions Precautions: ICD/Pacemaker Restrictions Weight Bearing Restrictions: No      Mobility Bed Mobility   Bed Mobility: Sidelying to Sit     Supine to sit: Mod assist Sit to supine: Mod assist;+2 for physical assistance   General bed mobility comments: assist x2 for back to bed to help pt straighten out  Transfers   Equipment used: Rolling walker (2 wheeled) Transfers: Sit to/from Stand Sit to Stand: Mod assist;+2 safety/equipment         General transfer comment: cues for pushing up with hand.  +2 for safety as pt is fearful of falling; son usually stands in front of walker to assist    Balance                                            ADL Overall ADL's : Needs assistance/impaired                                       General ADL Comments: family assists with ADLs; pt does stand and pivot to commode with assist.  Pt does not like our 3:1--hers has a padded seat.  Stood for 2 minutes and sidestepped up [place].  Family feels comfortable helping her at home and states she is getting stronger     Vision                     Perception     Praxis      Pertinent Vitals/Pain Pain Assessment: No/denies pain.  Pt  did c/o dizziness when sitting.  BP 138/82     Hand Dominance     Extremity/Trunk Assessment Upper Extremity Assessment Upper Extremity Assessment: RUE deficits/detail RUE Deficits / Details: grossly 3+/5; LUE WFLs           Communication Communication Communication: HOH (Deaf L ear; legally blind)   Cognition Arousal/Alertness: Awake/alert Behavior During Therapy: WFL for tasks assessed/performed Overall Cognitive Status: Within Functional Limits for tasks assessed                     General Comments       Exercises       Shoulder Instructions      Home Living Family/patient expects to be discharged to:: Private residence Living Arrangements: [place]: [place] Rile - 2 wheels;Bedside commode;Wheelchair - manual;Hand held shower head;Adaptive  equipment          Prior Functioning/Environment Level of Independence: Needs assistance        Comments: Family assists her with most all ADLs and mobility at this time. Pt also reports taht she gets dizzy and has for some time with movement.     OT Diagnosis: Generalized weakness   OT Problem List: Decreased strength;Decreased activity tolerance;Impaired balance (sitting and/or standing)   OT Treatment/Interventions: Self-care/ADL training;DME and/or AE instruction;Patient/family education;Other (comment)    OT Goals(Current goals can be found in the care plan section) Acute Rehab OT Goals Patient Stated Goal: I want to be able to go home soon and gt up on my feet again.  OT Goal Formulation: With patient/family Time For Goal Achievement: 02/14/14 Potential to Achieve Goals: Good ADL Goals Pt Will Transfer to Toilet: with min assist;bedside commode;stand pivot transfer Additional ADL Goal #1: pt will go from sit to stand with min A for adls and hold for 3 minutes with min guard for adls  OT Frequency: Min 2X/week   Barriers to D/C:             Co-evaluation              End of Session    Activity Tolerance: Patient tolerated treatment well Patient left: in bed;with call bell/phone within reach;with family/visitor present   Time: 2707-8675 OT Time Calculation (min): 28 min Charges:  OT General Charges $OT Visit: 1 Procedure OT Evaluation $Initial OT Evaluation Tier I: 1 Procedure OT Treatments $Therapeutic Activity: 8-22 mins G-Codes:    Sovereign Ramiro 2014-03-07, 11:53 AM  Lesle Chris, OTR/L [phone] 2014/03/07

## 2014-02-07 NOTE — Discharge Instructions (Signed)
Patient to start taking Coumadin 1/25 INR check 1/25

## 2014-02-09 ENCOUNTER — Telehealth: Payer: Self-pay | Admitting: Cardiovascular Disease

## 2014-02-09 NOTE — Telephone Encounter (Signed)
[name] was calling in wanting some clarity on some directions for her Metoprolol prescription. She says it differs from what is on the bottle and what is on her papers from the hospital. Please call  Thanks

## 2014-02-09 NOTE — Telephone Encounter (Signed)
Daughter calling. Advised based on most recent updated med record. This corresponds to instructions she was also given via telephone today by hospital staff. Caller voiced understanding.  They will see [name] on Thursday and med list verified then. For now I advised to take meds as directed from hospital discharge and to call us for any change or concerns.

## 2014-02-10 ENCOUNTER — Ambulatory Visit (INDEPENDENT_AMBULATORY_CARE_PROVIDER_SITE_OTHER): Payer: Medicare Other | Admitting: Endocrinology

## 2014-02-10 ENCOUNTER — Encounter: Payer: Self-pay | Admitting: Endocrinology

## 2014-02-10 VITALS — BP 124/68 | HR 65 | Temp 98.5°F | Resp 14 | Ht 64.0 in | Wt 198.0 lb

## 2014-02-10 DIAGNOSIS — E063 Autoimmune thyroiditis: Secondary | ICD-10-CM

## 2014-02-10 DIAGNOSIS — E038 Other specified hypothyroidism: Secondary | ICD-10-CM

## 2014-02-10 DIAGNOSIS — IMO0002 Reserved for concepts with insufficient information to code with codable children: Secondary | ICD-10-CM

## 2014-02-10 DIAGNOSIS — E1165 Type 2 diabetes mellitus with hyperglycemia: Secondary | ICD-10-CM

## 2014-02-10 LAB — CULTURE, BLOOD (ROUTINE X 2)
Culture: NO GROWTH
Culture: NO GROWTH

## 2014-02-10 NOTE — Progress Notes (Signed)
Patient ID: [name], female   DOB: 1920-09-10, 81 y.o.   MRN: [id number]   Reason for Appointment: Diabetes follow-up   History of Present Illness   Diagnosis: Type 2 diabetes mellitus, date of diagnosis: 1972.   She had been on insulin for several years and this had been tapered off in 2013 because of weight loss and lower blood sugars. Subsequently blood sugars had been generally reasonably good.  Because of her age and multiple medical problems she has been monitored without insulin  Recent history: She was recently hospitalized for multiple medical problems and is now here for follow-up Her glucose levels in the hospital appeared to be fairly well controlled but she was getting sliding scale insulin at times Difficult to interpret her home monitor download as her daughter is also checking blood sugar on the same meter Her family said that she is rarely taking glipizide only when blood sugar is over 200, did take it last evening before supper Family thinks that her blood sugars are generally below 200, checking sporadically, usually once or twice a day Her A1c is about the same as usual Her appetite has been variable and she has inconsistent likes and dislikes, her family is asking about what to give her for food No hypoglycemia Her weight has leveled off   Monitors blood glucose: 1-2 times daily.  Glucometer: Accucheck [employer]iva.  Blood Glucose readings: >200 occasionally in the evenings, morning sugar difficult to interpret on download as a meter is being used by another individual   Meals: 2 meals at noon and 7 pm; sometimes has lemonade or sprite. Has small portions Dietician visit: Most recent:2001.   Complications: peripheral neuropathy. Urine microalbumin normal previously    Wt Readings from Last 3 Encounters:  02/10/14 198 lb (89.812 kg)  02/07/14 198 lb 6.6 oz (90 kg)  11/19/13 195 lb (88.451 kg)   Lab Results  Component Value Date   HGBA1C 7.9* 02/04/2014    HGBA1C 7.8* 10/24/2013   HGBA1C 7.6* 06/24/2013   Lab Results  Component Value Date   LDLCALC 61 06/24/2013   CREATININE 1.12* 02/07/2014       Medication List       This list is accurate as of: 02/10/14  8:49 AM.  Always use your most recent med list.               ACCU-CHEK AVIVA PLUS test strip  Generic drug:  glucose blood  Use to check blood sugars 2 times daily     ACCU-CHEK AVIVA PLUS W/DEVICE Kit  Use to check blood sugar 2 times per day dx code 250.00     ACCU-CHEK FASTCLIX LANCET Kit  Use as directec     accu-chek multiclix lancets  Use as instructed to check blood sugar 2 times per day dx code 250.00     acetaminophen-codeine 300-30 MG per tablet  Commonly known as:  TYLENOL #3     aspirin 81 MG chewable tablet  Chew 81 mg by mouth daily.     atropine 1 % ophthalmic solution  Place 1 drop into the right eye 4 (four) times daily.     cholecalciferol 1000 UNITS tablet  Commonly known as:  VITAMIN D  Take 1,000 Units by mouth daily.     feeding supplement (GLUCERNA SHAKE) Liqd  Take 237 mLs by mouth 2 (two) times daily between meals.     folic acid 1 MG tablet  Commonly known as:  FOLVITE  Take 1  tablet (1 mg total) by mouth daily.     furosemide 40 MG tablet  Commonly known as:  LASIX  Take 0.5 tablets (20 mg total) by mouth daily.     gabapentin 100 MG capsule  Commonly known as:  NEURONTIN  Take 100 mg by mouth 2 (two) times daily.     glipiZIDE 5 MG tablet  Commonly known as:  GLUCOTROL  Take one-half tablet by  mouth daily only if blood  sugar is over 200     guaiFENesin 600 MG 12 hr tablet  Commonly known as:  MUCINEX  Take 600 mg by mouth 2 (two) times daily.     hydrocortisone 25 MG suppository  Commonly known as:  ANUSOL-HC  Use 1 suppository rectally at bedtime for 7 days.     Hyoscyamine Sulfate 0.375 MG Tbcr  A1 tab twice a day as needed, abdominal pain     levothyroxine 50 MCG tablet  Commonly known as:  SYNTHROID,  LEVOTHROID  Take 1 tablet (50 mcg total) by mouth daily before breakfast.     loratadine 10 MG tablet  Commonly known as:  CLARITIN  Take 10 mg by mouth daily.     metoprolol tartrate 25 MG tablet  Commonly known as:  LOPRESSOR  Take 1 tablet (25 mg total) by mouth 2 (two) times daily.     nitroGLYCERIN 0.4 mg/hr patch  Commonly known as:  NITRODUR - Dosed in mg/24 hr  Place 1 patch (0.4 mg total) onto the skin daily.     omeprazole 20 MG capsule  Commonly known as:  PRILOSEC  Take 1 capsule (20 mg total) by mouth daily.     potassium chloride SA 20 MEQ tablet  Commonly known as:  K-DUR,KLOR-CON  Take 20 mEq by mouth every other day.     potassium chloride SA 20 MEQ tablet  Commonly known as:  K-DUR,KLOR-CON  take 1 tablet by mouth once daily for 3 days  WITH FUROSEMIDE THEN AS DIRECTED     REFRESH OP  Apply 1 drop to eye daily.     rosuvastatin 10 MG tablet  Commonly known as:  CRESTOR  Take 10 mg by mouth at bedtime.     senna-docusate 8.6-50 MG per tablet  Commonly known as:  Senokot-S  Take 1 tablet by mouth 2 (two) times daily.     tobramycin 0.3 % ophthalmic solution  Commonly known as:  TOBREX  Place 1 drop into the right eye daily as needed.     traMADol 50 MG tablet  Commonly known as:  ULTRAM  Take 1 tablet (50 mg total) by mouth every 6 (six) hours as needed for moderate pain.     Vitamin D (Ergocalciferol) 50000 UNITS Caps capsule  Commonly known as:  DRISDOL  Take 50,000 Units by mouth daily.     warfarin 2.5 MG tablet  Commonly known as:  COUMADIN  Take 1 to 1.5 tablets by mouth daily as directed by coumadin clinic     zolpidem 10 MG tablet  Commonly known as:  AMBIEN  Take 5 mg by mouth at bedtime as needed. sleep        Past Medical History  Diagnosis Date  . Hypertension   . Coronary artery disease   . Legally blind   . Chronic kidney disease   . Anemia   . Hypothyroid   . Gastric ulcer   . Hiatal hernia   . Dyspepsia   . UTI  (lower urinary  tract infection)   . H/O: GI bleed 12//13  . Diastolic dysfunction   . Pacemaker   . High cholesterol   . CHF (congestive heart failure)   . Anginal pain   . MI (myocardial infarction) ? 1994  . DVT of upper extremity (deep vein thrombosis) 06/13/2012    BUE  . Seasonal allergies   . Allergy to perfume     "strong perfumes only" (06/13/2012)  . Shortness of breath     "can happen at any time" (06/13/2012)  . Type II diabetes mellitus   . History of blood transfusion 2013    "not related to OR; BP had dropped" (06/13/2012)  . GERD (gastroesophageal reflux disease)   . Daily headache   . Arthritis     "all over" (06/13/2012)  . A-fib   . 2Nd degree atrioventricular block   . Abnormal nuclear stress test, 08/2013 09/17/2013    Past Surgical History  Procedure Laterality Date  . Esophagogastroduodenoscopy  12/22/2011    Procedure: ESOPHAGOGASTRODUODENOSCOPY (EGD);  Surgeon: Gatha Mayer, MD;  Location: [facility];  Service: Endoscopy;  Laterality: N/A;  . Cardioversion  06/06/06    successful  . Insert / replace / remove pacemaker  06/29/2006    Medtronic adapta  . Cardiac catheterization  10/25/92  . Cardiac catheterization  11/18/03    w/grafts 100%CX LAD 80 & 100%  . Cardiac catheterization  01/24/05    diffuse disease of native vessels  . Cardiac catheterization  06/06/06    severe native CAD  . Coronary artery bypass graft  10/27/92    LIMA to LAD,SVG to LAD second diagonal,obtuse maraginal of the CX and posterior descendingbranch of the RCA  . Cataract extraction w/ intraocular lens  implant, bilateral Bilateral   . Refractive surgery Bilateral     Family History  Problem Relation Age of Onset  . Breast cancer Daughter   . Diabetes Son   . Diabetes Daughter   . Heart disease Father   . Diabetes Father   . Colon polyps Daughter   . Heart attack Brother   . Diabetes Brother   . Stroke Sister     Social History:  reports that she has never smoked. She  has never used smokeless tobacco. She reports that she does not drink alcohol or use illicit drugs.  Allergies:  Allergies  Allergen Reactions  . Darvon     Unknown reaction   . Digoxin And Related Nausea Only  . Penicillins Other (See Comments)    Was told had allergy from childhood... Unknown reaction  . Percocet [Oxycodone-Acetaminophen] Other (See Comments)    confused  . Percodan [Oxycodone-Aspirin]   . Vicodin [Hydrocodone-Acetaminophen] Other (See Comments)    confused    REVIEW of systems:  Hypothyroidism: She is taking only 50 mcg daily with stable TSH, recently checked in the hospital   Lab Results  Component Value Date   TSH 2.117 02/04/2014    She has had renal dysfunction which is long-standing but relatively normal now  Lab Results  Component Value Date   CREATININE 1.12* 02/07/2014    Has had periodic pedal edema. Does wear elastic stockings  She has significant neuropathy with sensory loss on previous recent foot exam.     Examination:   BP 124/68 mmHg  Pulse 65  Temp(Src) 98.5 F (36.9 C)  Resp 14  Ht _0  (1.626 m)  Wt 198 lb (89.812 kg)  BMI 33.97 kg/m2  SpO2 95%  Body mass  index is 33.97 kg/(m^2).    Assesment/plan:   1.  Diabetes type 2, with obesity, long-standing and previously on insulin.   Her A1c is stable at 7.9 which is reasonably good for her age and multiple medical problems Blood sugar checked at home are reportedly mostly below 200  She is taking her low-dose glipizide  only when blood sugars are unusually high or she is eating sweets  Since her A1c has been below 8% will continue to observe without medications Advised her that she can be liberal with her diet as long as she watches sodium intake and have balanced meals  2. Hypothyroidism: She has been on a  relatively low dose of 50 mcg for some time and TSH  quite stable     Mohmed Farver 02/10/2014, 8:49 AM

## 2014-02-10 NOTE — Patient Instructions (Signed)
Liberal diet

## 2014-02-12 ENCOUNTER — Encounter: Payer: Self-pay | Admitting: Cardiology

## 2014-02-12 ENCOUNTER — Ambulatory Visit (INDEPENDENT_AMBULATORY_CARE_PROVIDER_SITE_OTHER): Payer: Medicare Other | Admitting: Cardiology

## 2014-02-12 ENCOUNTER — Ambulatory Visit (INDEPENDENT_AMBULATORY_CARE_PROVIDER_SITE_OTHER): Payer: Medicare Other | Admitting: Pharmacist Clinician (PhC)/ Clinical Pharmacy Specialist

## 2014-02-12 VITALS — BP 129/72 | HR 67 | Ht 64.0 in | Wt 196.0 lb

## 2014-02-12 DIAGNOSIS — R7989 Other specified abnormal findings of blood chemistry: Secondary | ICD-10-CM

## 2014-02-12 DIAGNOSIS — I9589 Other hypotension: Secondary | ICD-10-CM

## 2014-02-12 DIAGNOSIS — Z79899 Other long term (current) drug therapy: Secondary | ICD-10-CM

## 2014-02-12 DIAGNOSIS — R079 Chest pain, unspecified: Secondary | ICD-10-CM

## 2014-02-12 DIAGNOSIS — I82629 Acute embolism and thrombosis of deep veins of unspecified upper extremity: Secondary | ICD-10-CM

## 2014-02-12 DIAGNOSIS — I82409 Acute embolism and thrombosis of unspecified deep veins of unspecified lower extremity: Secondary | ICD-10-CM

## 2014-02-12 DIAGNOSIS — R945 Abnormal results of liver function studies: Secondary | ICD-10-CM

## 2014-02-12 DIAGNOSIS — Z950 Presence of cardiac pacemaker: Secondary | ICD-10-CM

## 2014-02-12 DIAGNOSIS — Z7901 Long term (current) use of anticoagulants: Secondary | ICD-10-CM

## 2014-02-12 LAB — CBC
HCT: 38.3 % (ref 36.0–46.0)
Hemoglobin: 12.4 g/dL (ref 12.0–15.0)
MCH: 28.9 pg (ref 26.0–34.0)
MCHC: 32.4 g/dL (ref 30.0–36.0)
MCV: 89.3 fL (ref 78.0–100.0)
MPV: 10.3 fL (ref 8.6–12.4)
Platelets: 227 10*3/uL (ref 150–400)
RBC: 4.29 MIL/uL (ref 3.87–5.11)
RDW: 14.2 % (ref 11.5–15.5)
WBC: 8.9 10*3/uL (ref 4.0–10.5)

## 2014-02-12 LAB — COMPREHENSIVE METABOLIC PANEL
ALT: 69 U/L — ABNORMAL HIGH (ref 0–35)
AST: 41 U/L — ABNORMAL HIGH (ref 0–37)
Albumin: 3.4 g/dL — ABNORMAL LOW (ref 3.5–5.2)
Alkaline Phosphatase: 219 U/L — ABNORMAL HIGH (ref 39–117)
BUN: 17 mg/dL (ref 6–23)
CO2: 31 mEq/L (ref 19–32)
Calcium: 9.1 mg/dL (ref 8.4–10.5)
Chloride: 98 mEq/L (ref 96–112)
Creat: 1.33 mg/dL — ABNORMAL HIGH (ref 0.50–1.10)
Glucose, Bld: 193 mg/dL — ABNORMAL HIGH (ref 70–99)
Potassium: 4.7 mEq/L (ref 3.5–5.3)
Sodium: 137 mEq/L (ref 135–145)
Total Bilirubin: 0.9 mg/dL (ref 0.2–1.2)
Total Protein: 6.2 g/dL (ref 6.0–8.3)

## 2014-02-12 LAB — POCT INR: INR: 1.8

## 2014-02-12 MED ORDER — NITROGLYCERIN 0.2 MG/HR TD PT24: 0.2000 mg | MEDICATED_PATCH | Freq: Every day | TRANSDERMAL | Status: DC

## 2014-02-12 NOTE — Assessment & Plan Note (Signed)
Multiple types of chest pain, may be anginal as well as secondary to reflux and DJD

## 2014-02-12 NOTE — Assessment & Plan Note (Signed)
Abnormal Nuc in Aug and new LVD on recent echo, plan has been for medical Rx

## 2014-02-12 NOTE — Patient Instructions (Addendum)
Your physician wants you to follow-up in: 3 Months with Dr Loletha Grayer Dennis Bast will receive a reminder letter in the mail two months in advance. If you don't receive a letter, please call our office to schedule the follow-up appointment.  Your physician has recommended you make the following change in your medication: Stop Crestor and reduce Nitrodur Patch to 0.2 mg   Your physician recommends that you return for lab work in: Today CMP, CBC

## 2014-02-12 NOTE — Assessment & Plan Note (Signed)
Admitted 02/04/14 with shock, ? secondary to UTI (culture negative, UA suggestive)

## 2014-02-12 NOTE — Assessment & Plan Note (Signed)
Felt to be secondary to shock liver

## 2014-02-12 NOTE — Assessment & Plan Note (Signed)
medtronic Adapta 06/29/06  secondary to symptomatic bradycardia and 2nd degree heart block

## 2014-02-12 NOTE — Assessment & Plan Note (Signed)
On Coumadin 

## 2014-02-12 NOTE — Progress Notes (Signed)
02/12/2014 [name]   1920/01/31  [id number]  Primary Physician Marlou Sa [name], MD Primary Cardiologist: Dr Sallyanne Kuster  HPI:  81 y.o. Widowed, female (her husband had been one of our pts as well) who is nonambulatory (uses a wheelchair) and has a history of coronary disease, previous bypass surgery, permanent pacemaker for high-grade AV block, paroxysmal atrial fibrillation, recent upper extremity deep venous thrombosis associated with a venous catheter and recurrent problems with severe edema due to gastrointestinal bleeding. She is on coumadin. She is followed by Dr. Sallyanne Kuster. She had a Myoview in Aug for chest pain that was abnormal. It was decided to treat her medically.           She was recently admitted with hypotension and shock. Etiology is not clear. She had elevated LFTs felt to be secondary to shock liver. An echo was done that showed "moderate to severe" LVD. Her previous EF was 40-45%  by echo in Feb 2015, but her EF was noted to be significantly depressed by [facility] Aug 2015.             She is seen in the office today for follow up. She has multiple complaints. She has constant headache. She has all different types of chest pain, pleuritic, reflux, and possibly angina pain as well.    Current Outpatient Prescriptions  Medication Sig Dispense Refill  . ACCU-CHEK AVIVA PLUS test strip Use to check blood sugars 2 times daily 200 each 1  . acetaminophen-codeine (TYLENOL #3) 300-30 MG per tablet   0  . aspirin 81 MG chewable tablet Chew 81 mg by mouth daily.    Marland Kitchen atropine 1 % ophthalmic solution Place 1 drop into the right eye 4 (four) times daily.   0  . Blood Glucose Monitoring Suppl (ACCU-CHEK AVIVA PLUS) W/DEVICE KIT Use to check blood sugar 2 times per day dx code 250.00 1 kit 0  . cholecalciferol (VITAMIN D) 1000 UNITS tablet Take 1,000 Units by mouth daily.    . feeding supplement, GLUCERNA SHAKE, (GLUCERNA SHAKE) LIQD Take 237 mLs by mouth 2 (two) times daily between  meals.    . folic acid (FOLVITE) 1 MG tablet Take 1 tablet (1 mg total) by mouth daily. 90 tablet 3  . furosemide (LASIX) 40 MG tablet Take 0.5 tablets (20 mg total) by mouth daily. 30 tablet 6  . gabapentin (NEURONTIN) 100 MG capsule Take 100 mg by mouth 2 (two) times daily.     Marland Kitchen glipiZIDE (GLUCOTROL) 5 MG tablet Take one-half tablet by  mouth daily only if blood  sugar is over 200 45 tablet 1  . guaiFENesin (MUCINEX) 600 MG 12 hr tablet Take 600 mg by mouth 2 (two) times daily.    . hydrocortisone (ANUSOL-HC) 25 MG suppository Use 1 suppository rectally at bedtime for 7 days. 7 suppository 1  . Hyoscyamine Sulfate 0.375 MG TBCR A1 tab twice a day as needed, abdominal pain 25 tablet 1  . Lancets (ACCU-CHEK MULTICLIX) lancets Use as instructed to check blood sugar 2 times per day dx code 250.00 200 each 1  . Lancets Misc. (ACCU-CHEK FASTCLIX LANCET) KIT Use as directec 1 kit 1  . levothyroxine (SYNTHROID, LEVOTHROID) 50 MCG tablet Take 1 tablet (50 mcg total) by mouth daily before breakfast. 30 tablet 5  . loratadine (CLARITIN) 10 MG tablet Take 10 mg by mouth daily.    . metoprolol tartrate (LOPRESSOR) 25 MG tablet Take 1 tablet (25 mg total) by mouth 2 (  two) times daily. 90 tablet 6  . omeprazole (PRILOSEC) 20 MG capsule Take 1 capsule (20 mg total) by mouth daily. 90 capsule 3  . Polyvinyl Alcohol-Povidone (REFRESH OP) Apply 1 drop to eye daily.    . potassium chloride SA (K-DUR,KLOR-CON) 20 MEQ tablet take 1 tablet by mouth once daily for 3 days  WITH FUROSEMIDE THEN AS DIRECTED 30 tablet 2  . potassium chloride SA (K-DUR,KLOR-CON) 20 MEQ tablet Take 20 mEq by mouth every other day.    . senna-docusate (SENOKOT-S) 8.6-50 MG per tablet Take 1 tablet by mouth 2 (two) times daily. 30 tablet 2  . tobramycin (TOBREX) 0.3 % ophthalmic solution Place 1 drop into the right eye daily as needed.     . traMADol (ULTRAM) 50 MG tablet Take 1 tablet (50 mg total) by mouth every 6 (six) hours as needed for  moderate pain. 30 tablet 0  . Vitamin D, Ergocalciferol, (DRISDOL) 50000 UNITS CAPS capsule Take 50,000 Units by mouth daily.     Marland Kitchen warfarin (COUMADIN) 2.5 MG tablet Take 1 to 1.5 tablets by mouth daily as directed by coumadin clinic 40 tablet 2  . zolpidem (AMBIEN) 10 MG tablet Take 5 mg by mouth at bedtime as needed. sleep    . nitroGLYCERIN (NITRODUR - DOSED IN MG/24 HR) 0.2 mg/hr patch Place 1 patch (0.2 mg total) onto the skin daily. 30 patch 6   No current facility-administered medications for this visit.    Allergies  Allergen Reactions  . Darvon     Unknown reaction   . Digoxin And Related Nausea Only  . Penicillins Other (See Comments)    Was told had allergy from childhood... Unknown reaction  . Percocet [Oxycodone-Acetaminophen] Other (See Comments)    confused  . Percodan [Oxycodone-Aspirin]   . Vicodin [Hydrocodone-Acetaminophen] Other (See Comments)    confused    History   Social History  . Marital Status: Married    Spouse Name: N/A    Number of Children: 81  . Years of Education: N/A   Occupational History  .     Social History Main Topics  . Smoking status: Never Smoker   . Smokeless tobacco: Never Used  . Alcohol Use: No  . Drug Use: No  . Sexual Activity: No   Other Topics Concern  . Not on file   Social History Narrative     Review of Systems: General: negative for chills, fever, night sweats or weight changes.  Cardiovascular: negative for chest pain, dyspnea on exertion, edema, orthopnea, palpitations, paroxysmal nocturnal dyspnea or shortness of breath Dermatological: negative for rash Respiratory: negative for cough or wheezing Urologic: negative for hematuria Abdominal: negative for nausea, vomiting, diarrhea, bright red blood per rectum, melena, or hematemesis Neurologic: negative for visual changes, syncope, or dizziness All other systems reviewed and are otherwise negative except as noted above.    Blood pressure 129/72, pulse  67, height $RemoveBe'5\' 4"'IrILEQQGe$  (1.626 m), weight 196 lb (88.905 kg).  General appearance: alert, cooperative and no distress, obese, in wheel chair Lungs: clear to auscultation bilaterally Heart: regular rate and rhythm  EKG Paced  ASSESSMENT AND PLAN:   Chest pain Multiple types of chest pain, may be anginal as well as secondary to reflux and DJD   Hypotension Admitted 02/04/14 with shock, ? secondary to UTI (culture negative, UA suggestive)   Abnormal LFTs (liver function tests) Felt to be secondary to shock liver   Coronary artery disease, CABG in 1994. Myoview abnormal Aug 2015-  medical Rx Abnormal Nuc in Aug and new LVD on recent echo, plan has been for medical Rx   Pacemaker medtronic Adapta 06/29/06  secondary to symptomatic bradycardia and 2nd degree heart block   DVT of upper extremity (deep vein thrombosis) On Coumadin    PLAN  I spoke with the pt's daughter by phone and her son accompanied her to the office as well. I cut her Nitro Dur patch back to 0.2 mg secondary to her headaches.  I stopped her Crestor and will check Cmet and CBC today. If her LFTs have resolved we can resume her Crestor, possibly at a lower dose, and I would add [name] 500 mg daily. She can f/u with Dr Sallyanne Kuster in 3 months.   Sakoya Win KPA-C 02/12/2014 12:09 PM

## 2014-02-16 ENCOUNTER — Encounter: Payer: Self-pay | Admitting: *Deleted

## 2014-02-16 ENCOUNTER — Telehealth: Payer: Self-pay | Admitting: Cardiology

## 2014-02-16 ENCOUNTER — Other Ambulatory Visit: Payer: Self-pay | Admitting: Cardiology

## 2014-02-16 ENCOUNTER — Other Ambulatory Visit: Payer: Self-pay | Admitting: *Deleted

## 2014-02-16 DIAGNOSIS — N179 Acute kidney failure, unspecified: Secondary | ICD-10-CM

## 2014-02-16 DIAGNOSIS — N189 Chronic kidney disease, unspecified: Secondary | ICD-10-CM

## 2014-02-16 DIAGNOSIS — R7989 Other specified abnormal findings of blood chemistry: Secondary | ICD-10-CM

## 2014-02-16 DIAGNOSIS — R945 Abnormal results of liver function studies: Principal | ICD-10-CM

## 2014-02-16 MED ORDER — ROSUVASTATIN CALCIUM 5 MG PO TABS: 10.0000 mg | ORAL_TABLET | ORAL | Status: DC

## 2014-02-16 MED ORDER — RANOLAZINE ER 500 MG PO TB12: 500.0000 mg | ORAL_TABLET | Freq: Every day | ORAL | Status: DC

## 2014-02-16 NOTE — Telephone Encounter (Signed)
Pt saw Lurena Joiner Thursday,please call have daughter has a kit of questions please.

## 2014-02-16 NOTE — Telephone Encounter (Signed)
That is perfect.  Kerin Ransom PA-C 02/16/2014 2:17 PM

## 2014-02-16 NOTE — Addendum Note (Signed)
Addended byChauncy Lean. on: 02/16/2014 05:07 PM   Modules accepted: Orders

## 2014-02-16 NOTE — Telephone Encounter (Signed)
I spoke daughter.  She inquired about "normal" bp readings that would allow for her metoprolol to be given.  After reviewing her mom's bp readings, I advised not to give the mediation if her bp < 100/60 and her heart rate was <60.  This morning her bp was 123/64 with a heart rate of 61.  Melanie voiced understanding.   Luke, please advise if you would like these limits to be altered.

## 2014-02-18 ENCOUNTER — Telehealth: Payer: Self-pay | Admitting: Cardiovascular Disease

## 2014-02-18 MED ORDER — ROSUVASTATIN CALCIUM 10 MG PO TABS: 10.0000 mg | ORAL_TABLET | ORAL | Status: DC

## 2014-02-18 NOTE — Telephone Encounter (Signed)
Spoke to daughter. We discussed medication administration for nitro patch, she thinks this is the medication she needed clarification on. She voiced understanding, will call if she needs further instructions or clarification on another medication.  Crestor samples left at front desk also, per request.

## 2014-02-18 NOTE — Telephone Encounter (Signed)
She wants to know how often is the pt suppose to take the samples that she picked up yesterday. She did not know the name of them,because she is not at home.

## 2014-02-19 ENCOUNTER — Telehealth: Payer: Self-pay | Admitting: Pharmacist Clinician (PhC)/ Clinical Pharmacy Specialist

## 2014-02-20 ENCOUNTER — Telehealth: Payer: Self-pay | Admitting: *Deleted

## 2014-02-20 NOTE — Telephone Encounter (Signed)
Called and informed family that Erasmo Downer, pharmD  review the ingredients in the vitamins and they are safe to take with her other meds.  Voiced understanding.

## 2014-02-23 ENCOUNTER — Telehealth: Payer: Self-pay | Admitting: *Deleted

## 2014-02-23 ENCOUNTER — Other Ambulatory Visit: Payer: Self-pay | Admitting: *Deleted

## 2014-02-23 ENCOUNTER — Telehealth: Payer: Self-pay | Admitting: Endocrinology

## 2014-02-23 NOTE — Telephone Encounter (Signed)
Patients daughter called about her mom's blood sugars reading.  1/30- fasting 98 2/1-fasting 189 6:30 pm 217 2/3- 159 fasting 7:35 176 2/5 fasting 184 2/6 fasting 220 2/7 fasting 159  6:50 pm 265 2/8 fasting 243 Patients son gave her a half of glipizide after he checked her sugar this morning and it went up to 261. Please advise

## 2014-02-23 NOTE — Telephone Encounter (Signed)
She can start taking half tablet before breakfast and supper regularly and sugar should be better in a couple of days, call if not improved

## 2014-02-23 NOTE — Telephone Encounter (Signed)
Pt's sugars have been between 200 and 287 for the last 2 wks

## 2014-02-25 NOTE — Telephone Encounter (Signed)
Closed encounter °

## 2014-02-27 ENCOUNTER — Telehealth: Payer: Self-pay | Admitting: Cardiovascular Disease

## 2014-02-27 ENCOUNTER — Telehealth: Payer: Self-pay | Admitting: Endocrinology

## 2014-02-27 ENCOUNTER — Other Ambulatory Visit: Payer: Self-pay | Admitting: *Deleted

## 2014-02-27 MED ORDER — INSULIN GLARGINE 100 UNIT/ML ~~LOC~~ SOLN: SUBCUTANEOUS | Status: DC

## 2014-02-27 NOTE — Telephone Encounter (Signed)
Left message w/ daughter advising Dr. Lurline Del comments.

## 2014-02-27 NOTE — Telephone Encounter (Signed)
[name], [name] daughter called, she said even with her mom taking the half tablet of glipizide bid, she said it's running anywhere from 225-287 every time.   Daughter is also concerned because her mom is hallucinating, she wonders if it could be because of her sugar.

## 2014-02-27 NOTE — Telephone Encounter (Signed)
Noted, rx sent and patient is aware.

## 2014-02-27 NOTE — Telephone Encounter (Signed)
She can start taking Lantus 10 units daily once a day.  Do not think her hallucination is from sugar and needs to call PCP

## 2014-02-27 NOTE — Telephone Encounter (Signed)
Patient states her blood sugars have been running high  Also, the patient has been hallucinating; could this be a symptom   Call back: [phone]   Please advise   Thank you

## 2014-02-27 NOTE — Telephone Encounter (Signed)
Patient states her blood sugars have been running high  Also, the patient has been hallucinating; could this be a symptom   Call back: [phone]  Patient asked if you could please call back today

## 2014-02-27 NOTE — Telephone Encounter (Signed)
No additional recommendations.  

## 2014-02-27 NOTE — Telephone Encounter (Signed)
Daughter calling reported that beginning yesterday, pt had onset of confusion. States she is "seeing things". Denies disorientation to place/person/time.  She went to PCP for urine specimen and CBG check. CBG was high and instructions were given on how to lower it safely.  She has a urine culture pending.  Calling to see if Dr. Loletha Grayer had any addtl' recommendations.

## 2014-02-27 NOTE — Telephone Encounter (Signed)
[name] is calling because her mom has been confused all day on yesterday. Not sure why this is happening. Please call    Thanks

## 2014-03-03 ENCOUNTER — Telehealth: Payer: Self-pay | Admitting: Cardiovascular Disease

## 2014-03-03 NOTE — Telephone Encounter (Signed)
No contraindication from cardiac point of view. Watch for excessive sedation/confusion

## 2014-03-03 NOTE — Telephone Encounter (Signed)
error 

## 2014-03-03 NOTE — Telephone Encounter (Signed)
She wants to know if it is all right for her to go back on Tramadol?

## 2014-03-03 NOTE — Telephone Encounter (Signed)
Message sent to Dr.Croitoru for advice. 

## 2014-03-04 NOTE — Telephone Encounter (Signed)
[name] has home health coming weekly for visits - RN is Hardie Shackleton [phone]/ [phone]

## 2014-03-04 NOTE — Telephone Encounter (Signed)
Noted, advice given to caller.  She is asking now about getting HHRN to do coumadin checks at home and how they would get this set up.  Routing to [place] to advise.

## 2014-03-05 NOTE — Telephone Encounter (Signed)
LMOM for RN Estill Bamberg to draw PT/INR at next home visit

## 2014-03-06 ENCOUNTER — Ambulatory Visit: Payer: Medicare Other | Admitting: Pharmacist Clinician (PhC)/ Clinical Pharmacy Specialist

## 2014-03-10 ENCOUNTER — Ambulatory Visit (INDEPENDENT_AMBULATORY_CARE_PROVIDER_SITE_OTHER): Payer: Medicare Other | Admitting: Pharmacist Clinician (PhC)/ Clinical Pharmacy Specialist

## 2014-03-10 LAB — POCT INR: INR: 2.2

## 2014-03-23 ENCOUNTER — Telehealth: Payer: Self-pay | Admitting: Cardiovascular Disease

## 2014-03-23 MED ORDER — RANOLAZINE ER 500 MG PO TB12: 500.0000 mg | ORAL_TABLET | Freq: Every day | ORAL | Status: DC

## 2014-03-23 NOTE — Telephone Encounter (Signed)
Documents were received and waiting on Dr. Lurline Del review per B. Lassiter

## 2014-03-23 NOTE — Telephone Encounter (Signed)
There should be some documents here already or on their way today.The family is requesting that he approved that they give her Superoxide and Dismutase.The nextt one they are requesting is Tetrahydrobiopterin(Kuvan).Please call,if there are any questions.

## 2014-03-23 NOTE — Telephone Encounter (Signed)
[name] is calling to see if Mrs. Steinberg can get samples of Crestor 10mg  and [name] . Please call    Thanks

## 2014-03-23 NOTE — Telephone Encounter (Signed)
Returned call to patient samples of crestor 20 mg take 1/2 tablet daily left at front desk.Office out of ranexa 500 mg samples.[name] refill sent to pharmacy.

## 2014-03-25 LAB — POCT INR: INR: 2.4

## 2014-03-26 ENCOUNTER — Ambulatory Visit (INDEPENDENT_AMBULATORY_CARE_PROVIDER_SITE_OTHER): Payer: Medicare Other | Admitting: Pharmacist Clinician (PhC)/ Clinical Pharmacy Specialist

## 2014-03-31 ENCOUNTER — Telehealth: Payer: Self-pay | Admitting: Cardiovascular Disease

## 2014-03-31 MED ORDER — RANOLAZINE ER 500 MG PO TB12: 500.0000 mg | ORAL_TABLET | Freq: Every day | ORAL | Status: DC

## 2014-03-31 NOTE — Telephone Encounter (Signed)
Discussed paperwork with daughter.  It appeared it was the ingredients for an OTC suppl.  She seemed to think it was for a prescription.  Told her if that is the case she will need to get it from her PCP.  Voiced understanding.

## 2014-03-31 NOTE — Telephone Encounter (Signed)
She wants to know if the papers about some medicine that  they left for Dr C is ready yett?

## 2014-03-31 NOTE — Telephone Encounter (Signed)
Pt would like some samples of Crestor and [name] please.

## 2014-03-31 NOTE — Telephone Encounter (Signed)
Samples of [name] avalable to pick up Daughter is aware.

## 2014-04-16 ENCOUNTER — Encounter: Payer: Self-pay | Admitting: Endocrinology

## 2014-04-16 ENCOUNTER — Ambulatory Visit (INDEPENDENT_AMBULATORY_CARE_PROVIDER_SITE_OTHER): Payer: Medicare Other | Admitting: Endocrinology

## 2014-04-16 VITALS — BP 100/58 | HR 58 | Temp 98.2°F | Resp 14 | Ht 64.0 in

## 2014-04-16 DIAGNOSIS — R031 Nonspecific low blood-pressure reading: Secondary | ICD-10-CM | POA: Diagnosis not present

## 2014-04-16 DIAGNOSIS — E119 Type 2 diabetes mellitus without complications: Secondary | ICD-10-CM | POA: Diagnosis not present

## 2014-04-16 DIAGNOSIS — E1165 Type 2 diabetes mellitus with hyperglycemia: Secondary | ICD-10-CM | POA: Diagnosis not present

## 2014-04-16 DIAGNOSIS — E063 Autoimmune thyroiditis: Secondary | ICD-10-CM

## 2014-04-16 DIAGNOSIS — E038 Other specified hypothyroidism: Secondary | ICD-10-CM | POA: Diagnosis not present

## 2014-04-16 DIAGNOSIS — IMO0002 Reserved for concepts with insufficient information to code with codable children: Secondary | ICD-10-CM

## 2014-04-16 LAB — BASIC METABOLIC PANEL
BUN: 12 mg/dL (ref 6–23)
CO2: 29 mEq/L (ref 19–32)
Calcium: 9.2 mg/dL (ref 8.4–10.5)
Chloride: 104 mEq/L (ref 96–112)
Creatinine, Ser: 1.26 mg/dL — ABNORMAL HIGH (ref 0.40–1.20)
GFR: 51.1 mL/min — ABNORMAL LOW (ref >60.00)
GLUCOSE: 236 mg/dL — AB (ref 70–99)
Potassium: 4.6 mEq/L (ref 3.5–5.1)
Sodium: 136 mEq/L (ref 135–145)

## 2014-04-16 LAB — HEMOGLOBIN A1C: Hgb A1c MFr Bld: 8.2 % — ABNORMAL HIGH (ref 4.6–6.5)

## 2014-04-16 LAB — TSH: TSH: 6.25 u[IU]/mL — ABNORMAL HIGH (ref 0.35–4.50)

## 2014-04-16 MED ORDER — INSULIN GLARGINE 100 UNIT/ML SOLOSTAR PEN: 10.0000 [IU] | PEN_INJECTOR | Freq: Every day | SUBCUTANEOUS | Status: DC

## 2014-04-16 NOTE — Progress Notes (Signed)
Patient ID: [name], female   DOB: 1920/08/26, 81 y.o.   MRN: [id number]   Reason for Appointment: Diabetes follow-up   History of Present Illness   Diagnosis: Type 2 diabetes mellitus, date of diagnosis: 1972.   She had been on insulin for several years and this had been tapered off in 2013 because of weight loss and lower blood sugars. Subsequently blood sugars had been generally reasonably good.  Because of her age and multiple medical problems she has been monitored without insulin  Recent history:  Her family called in early February distended that her blood sugars are persistently over 200 even with taking glipizide Her Lantus was restarted at 10 units daily and she is taking this consistently in the evenings Also taking glipizide before supper when the blood sugar is high but not below 200 With this regimen her fasting blood sugars improved significantly and more recently having low normal also at times That sugars are mostly high at suppertime and she does not check any readings after supper Her diet is still about the same, having small portions of regular soft drinks at times too No hypoglycemia Her weight has been about the same  Monitors blood glucose: 2 times daily.  Glucometer: Accucheck Aviva.  Blood Glucose readings: Recent readings from download: FASTING = 99-142 with average about 135 After breakfast 253 Before supper: 153-273 with average about 182 at this time After supper not checked  Meals: 2 meals at noon and 7 pm; sometimes has lemonade or sprite. Has small portions Dietician visit: Most recent:2001.     Wt Readings from Last 3 Encounters:  02/12/14 196 lb (88.905 kg)  02/10/14 198 lb (89.812 kg)  02/07/14 198 lb 6.6 oz (90 kg)   Lab Results  Component Value Date   HGBA1C 8.2* 04/16/2014   HGBA1C 7.9* 02/04/2014   HGBA1C 7.8* 10/24/2013   Lab Results  Component Value Date   LDLCALC 61 06/24/2013   CREATININE 1.26* 04/16/2014       Medication List       This list is accurate as of: 04/16/14 11:59 PM.  Always use your most recent med list.               ACCU-CHEK AVIVA PLUS test strip  Generic drug:  glucose blood  Use to check blood sugars 2 times daily     ACCU-CHEK AVIVA PLUS W/DEVICE Kit  Use to check blood sugar 2 times per day dx code 250.00     ACCU-CHEK FASTCLIX LANCET Kit  Use as directec     accu-chek multiclix lancets  Use as instructed to check blood sugar 2 times per day dx code 250.00     acetaminophen-codeine 300-30 MG per tablet  Commonly known as:  TYLENOL #3     aspirin 81 MG chewable tablet  Chew 81 mg by mouth daily.     atropine 1 % ophthalmic solution  Place 1 drop into the right eye 4 (four) times daily.     cholecalciferol 1000 UNITS tablet  Commonly known as:  VITAMIN D  Take 1,000 Units by mouth daily.     feeding supplement (GLUCERNA SHAKE) Liqd  Take 237 mLs by mouth 2 (two) times daily between meals.     folic acid 1 MG tablet  Commonly known as:  FOLVITE  Take 1 tablet (1 mg total) by mouth daily.     furosemide 40 MG tablet  Commonly known as:  LASIX  Take 0.5 tablets (20  mg total) by mouth daily.     gabapentin 100 MG capsule  Commonly known as:  NEURONTIN  Take 100 mg by mouth 2 (two) times daily.     glipiZIDE 5 MG tablet  Commonly known as:  GLUCOTROL  Take one-half tablet by  mouth daily only if blood  sugar is over 200     guaiFENesin 600 MG 12 hr tablet  Commonly known as:  MUCINEX  Take 600 mg by mouth 2 (two) times daily.     hydrocortisone 25 MG suppository  Commonly known as:  ANUSOL-HC  Use 1 suppository rectally at bedtime for 7 days.     Hyoscyamine Sulfate 0.375 MG Tbcr  A1 tab twice a day as needed, abdominal pain     Insulin Glargine 100 UNIT/ML Solostar Pen  Commonly known as:  LANTUS SOLOSTAR  Inject 10 Units into the skin daily at 10 pm.     levothyroxine 50 MCG tablet  Commonly known as:  SYNTHROID, LEVOTHROID  Take 1  tablet (50 mcg total) by mouth daily before breakfast.     loratadine 10 MG tablet  Commonly known as:  CLARITIN  Take 10 mg by mouth daily.     meclizine 25 MG tablet  Commonly known as:  ANTIVERT  Take 25 mg by mouth 3 (three) times daily as needed for dizziness.     metoprolol tartrate 25 MG tablet  Commonly known as:  LOPRESSOR  Take 1 tablet (25 mg total) by mouth 2 (two) times daily.     nitroGLYCERIN 0.2 mg/hr patch  Commonly known as:  NITRODUR - Dosed in mg/24 hr  Place 1 patch (0.2 mg total) onto the skin daily.     omeprazole 20 MG capsule  Commonly known as:  PRILOSEC  Take 1 capsule (20 mg total) by mouth daily.     potassium chloride SA 20 MEQ tablet  Commonly known as:  K-DUR,KLOR-CON  Take 20 mEq by mouth every other day.     potassium chloride SA 20 MEQ tablet  Commonly known as:  K-DUR,KLOR-CON  take 1 tablet by mouth once daily for 3 days  WITH FUROSEMIDE THEN AS DIRECTED     ranolazine 500 MG 12 hr tablet  Commonly known as:  [name]  Take 1 tablet (500 mg total) by mouth daily.     REFRESH OP  Apply 1 drop to eye daily.     rosuvastatin 10 MG tablet  Commonly known as:  CRESTOR  - Take 1 tablet (10 mg total) by mouth 3 (three) times a week. [id number]  - Exp: 8-18     senna-docusate 8.6-50 MG per tablet  Commonly known as:  Senokot-S  Take 1 tablet by mouth 2 (two) times daily.     tobramycin 0.3 % ophthalmic solution  Commonly known as:  TOBREX  Place 1 drop into the right eye daily as needed.     traMADol 50 MG tablet  Commonly known as:  ULTRAM  Take 1 tablet (50 mg total) by mouth every 6 (six) hours as needed for moderate pain.     Vitamin D (Ergocalciferol) 50000 UNITS Caps capsule  Commonly known as:  DRISDOL  Take 50,000 Units by mouth daily.     warfarin 2.5 MG tablet  Commonly known as:  COUMADIN  Take 1 to 1.5 tablets by mouth daily as directed by coumadin clinic     zolpidem 10 MG tablet  Commonly known as:  AMBIEN  Take  5 mg  by mouth at bedtime as needed. sleep        Past Medical History  Diagnosis Date  . Hypertension   . Coronary artery disease   . Legally blind   . Chronic kidney disease   . Anemia   . Hypothyroid   . Gastric ulcer   . Hiatal hernia   . Dyspepsia   . UTI (lower urinary tract infection)   . H/O: GI bleed 12//13  . Diastolic dysfunction   . Pacemaker   . High cholesterol   . CHF (congestive heart failure)   . Anginal pain   . MI (myocardial infarction) ? 1994  . DVT of upper extremity (deep vein thrombosis) 06/13/2012    BUE  . Seasonal allergies   . Allergy to perfume     "strong perfumes only" (06/13/2012)  . Shortness of breath     "can happen at any time" (06/13/2012)  . Type II diabetes mellitus   . History of blood transfusion 2013    "not related to OR; BP had dropped" (06/13/2012)  . GERD (gastroesophageal reflux disease)   . Daily headache   . Arthritis     "all over" (06/13/2012)  . A-fib   . 2Nd degree atrioventricular block   . Abnormal nuclear stress test, 08/2013 09/17/2013    Past Surgical History  Procedure Laterality Date  . Esophagogastroduodenoscopy  12/22/2011    Procedure: ESOPHAGOGASTRODUODENOSCOPY (EGD);  Surgeon: Gatha Mayer, MD;  Location: [facility];  Service: Endoscopy;  Laterality: N/A;  . Cardioversion  06/06/06    successful  . Insert / replace / remove pacemaker  06/29/2006    Medtronic adapta  . Cardiac catheterization  10/25/92  . Cardiac catheterization  11/18/03    w/grafts 100%CX LAD 80 & 100%  . Cardiac catheterization  01/24/05    diffuse disease of native vessels  . Cardiac catheterization  06/06/06    severe native CAD  . Coronary artery bypass graft  10/27/92    LIMA to LAD,SVG to LAD second diagonal,obtuse maraginal of the CX and posterior descendingbranch of the RCA  . Cataract extraction w/ intraocular lens  implant, bilateral Bilateral   . Refractive surgery Bilateral     Family History  Problem Relation Age of  Onset  . Breast cancer Daughter   . Diabetes Son   . Diabetes Daughter   . Heart disease Father   . Diabetes Father   . Colon polyps Daughter   . Heart attack Brother   . Diabetes Brother   . Stroke Sister     Social History:  reports that she has never smoked. She has never used smokeless tobacco. She reports that she does not drink alcohol or use illicit drugs.  Allergies:  Allergies  Allergen Reactions  . Darvon     Unknown reaction   . Digoxin And Related Nausea Only  . Penicillins Other (See Comments)    Was told had allergy from childhood... Unknown reaction  . Percocet [Oxycodone-Acetaminophen] Other (See Comments)    confused  . Percodan [Oxycodone-Aspirin]   . Vicodin [Hydrocodone-Acetaminophen] Other (See Comments)    confused    REVIEW of systems:  She has been complaining about occasional sensation of movement even when she is sitting  Hypothyroidism: She is taking only 50 mcg daily with stable TSH, needs follow-up  Lab Results  Component Value Date   TSH 6.25* 04/16/2014    She has had renal dysfunction which is long-standing but relatively normal now  Lab  Results  Component Value Date   CREATININE 1.26* 04/16/2014    Has had periodic pedal edema. Does wear elastic stockings  She has significant neuropathy with sensory loss on previous  foot exam.     Examination:   BP 100/58 mmHg  Pulse 58  Temp(Src) 98.2 F (36.8 C)  Resp 14  Ht $R'5\' 4"'zp$  (1.626 m)  Wt   SpO2 94%  Body mass index is 0.00 kg/(m^2).    Assesment/plan:   1.  Diabetes type 2, with obesity, long-standing and now again on basal insulin.   Not clear why she is requiring insulin again with this appears to be controlling her fasting glucose fairly well Blood sugars are significantly high during the day after meals and not controlled with glipizide consistently However recent control is probably adequate for her age  Recommendations today: She will change Lantus to the morning  to cover daytime hyperglycemia better and reduced dose to 8 units To take glipizide before supper regardless of blood sugar Discussed trying to check some readings after meals instead of just before eating  Her A1c is stable at 7.9 as of 1/16 but may have gone up recently  2. Hypothyroidism: She has been on a  relatively low dose of 50 mcg for some time and TSH  will be rechecked today   3.  Renal dysfunction: To recheck her levels today  4.  Probable vertigo: She will discuss with PCP  5.  Low normal blood pressure: Will forward information to her PCP to adjust her medications  Patient Instructions  TAKE LANTUS 8 UNITS AT BREAKFAST Take Glipizide $RemoveBefore'5mg'qIvdtkodFOydV$  before supper daily Call if sugar gets below 90 or stays    Counseling time over 50% of today's 25 minute visit  Maghan Jessee 04/19/2014, 5:52 PM   Addendum: Labs as follows A1c 8.2 and TSH 6.25  To start using half tablet glipizide before breakfast also  She will add extra half tablet twice a week on her Synthroid for now  Office Visit on 04/16/2014  Component Date Value Ref Range Status  . Hgb A1c MFr Bld 04/16/2014 8.2* 4.6 - 6.5 % Final   Glycemic Control Guidelines for People with Diabetes:Non Diabetic:  <6%Goal of Therapy: <7%Additional Action Suggested:  >8%   . Sodium 04/16/2014 136  135 - 145 mEq/L Final  . Potassium 04/16/2014 4.6  3.5 - 5.1 mEq/L Final  . Chloride 04/16/2014 104  96 - 112 mEq/L Final  . CO2 04/16/2014 29  19 - 32 mEq/L Final  . Glucose, Bld 04/16/2014 236* 70 - 99 mg/dL Final  . BUN 04/16/2014 12  6 - 23 mg/dL Final  . Creatinine, Ser 04/16/2014 1.26* 0.40 - 1.20 mg/dL Final  . Calcium 04/16/2014 9.2  8.4 - 10.5 mg/dL Final  . GFR 04/16/2014 51.10* >60.00 mL/min Final  . TSH 04/16/2014 6.25* 0.35 - 4.50 uIU/mL Final

## 2014-04-16 NOTE — Patient Instructions (Addendum)
TAKE LANTUS 8 UNITS AT BREAKFAST Take Glipizide 5mg  before supper daily Call if sugar gets below 90 or stays

## 2014-04-19 NOTE — Progress Notes (Signed)
Quick Note:  Please let patient know that the sugar level was 236, needs to add half tablet glipizide before breakfast also Also thyroid is slightly low: She will add extra half tablet twice a week on her Synthroid for now  ______

## 2014-04-21 ENCOUNTER — Telehealth: Payer: Self-pay | Admitting: *Deleted

## 2014-04-21 NOTE — Telephone Encounter (Signed)
Noted, patients son is aware

## 2014-04-21 NOTE — Telephone Encounter (Signed)
I called the patients son to give him his moms lab results, I instructed him to add an extra half tablet of glipizide for high sugars, but he said since he's giving her 8 units of lantus every morning her sugars have come down, they are as follows.  4/2- 9:00 a.m 164 4.2- 6:30 pm 250  4/3- 9 a.m 132 4/3 6:42 pm 268  4/4 9 am 83 4/4 7:15 pm 100  4/5 9:10 am 98  He wants to know if you still want her taking that extra half tablet?

## 2014-04-21 NOTE — Telephone Encounter (Signed)
Reduce lantus to 6 but alternate before meal sugars with 2-3hrs after.  Glipizide to be given before meal if after meal sugar >200

## 2014-05-01 ENCOUNTER — Telehealth: Payer: Self-pay | Admitting: *Deleted

## 2014-05-01 NOTE — Telephone Encounter (Signed)
Family notified they can give her the vitamin supplement her nephew sent from [place] (tetrahydrobiopterin).  Voiced understanding.

## 2014-05-05 ENCOUNTER — Ambulatory Visit (INDEPENDENT_AMBULATORY_CARE_PROVIDER_SITE_OTHER): Payer: Medicare Other | Admitting: Pharmacist Clinician (PhC)/ Clinical Pharmacy Specialist

## 2014-05-05 ENCOUNTER — Other Ambulatory Visit: Payer: Self-pay | Admitting: Cardiovascular Disease

## 2014-05-05 LAB — POCT INR: INR: 2.6

## 2014-05-05 NOTE — Telephone Encounter (Signed)
Rx refill sent to patient pharmacy   

## 2014-05-14 ENCOUNTER — Ambulatory Visit (INDEPENDENT_AMBULATORY_CARE_PROVIDER_SITE_OTHER): Payer: Medicare Other | Admitting: Cardiovascular Disease

## 2014-05-14 ENCOUNTER — Encounter: Payer: Self-pay | Admitting: Cardiovascular Disease

## 2014-05-14 VITALS — BP 118/62 | HR 62 | Ht 64.0 in | Wt 190.5 lb

## 2014-05-14 DIAGNOSIS — Z950 Presence of cardiac pacemaker: Secondary | ICD-10-CM

## 2014-05-14 DIAGNOSIS — I442 Atrioventricular block, complete: Secondary | ICD-10-CM | POA: Diagnosis not present

## 2014-05-14 DIAGNOSIS — I25119 Atherosclerotic heart disease of native coronary artery with unspecified angina pectoris: Secondary | ICD-10-CM | POA: Diagnosis not present

## 2014-05-14 DIAGNOSIS — I5032 Chronic diastolic (congestive) heart failure: Secondary | ICD-10-CM | POA: Diagnosis not present

## 2014-05-14 DIAGNOSIS — I480 Paroxysmal atrial fibrillation: Secondary | ICD-10-CM | POA: Diagnosis not present

## 2014-05-14 LAB — MDC_IDC_ENUM_SESS_TYPE_INCLINIC
Battery Impedance: 3026 Ohm
Battery Remaining Longevity: 16 mo
Battery Voltage: 2.72 V
Brady Statistic AP VP Percent: 42.6 %
Brady Statistic AP VS Percent: 0.1 %
Brady Statistic AS VP Percent: 56.2 %
Lead Channel Pacing Threshold Amplitude: 0.5 V
Lead Channel Pacing Threshold Amplitude: 0.75 V
Lead Channel Pacing Threshold Pulse Width: 0.4 ms
Lead Channel Sensing Intrinsic Amplitude: 2 mV
Lead Channel Sensing Intrinsic Amplitude: 8 mV
Lead Channel Setting Pacing Pulse Width: 0.4 ms
MDC IDC MSMT LEADCHNL RA IMPEDANCE VALUE: 517 Ohm
MDC IDC MSMT LEADCHNL RV IMPEDANCE VALUE: 650 Ohm
MDC IDC MSMT LEADCHNL RV PACING THRESHOLD PULSEWIDTH: 0.4 ms
MDC IDC SET LEADCHNL RA PACING AMPLITUDE: 2 V
MDC IDC SET LEADCHNL RV PACING AMPLITUDE: 2 V
MDC IDC SET LEADCHNL RV SENSING SENSITIVITY: 2.8 mV
MDC IDC STAT BRADY AS VS PERCENT: 1 %

## 2014-05-14 MED ORDER — TRAMADOL HCL 50 MG PO TABS: 50.0000 mg | ORAL_TABLET | Freq: Four times a day (QID) | ORAL | Status: DC | PRN

## 2014-05-14 MED ORDER — RANOLAZINE ER 500 MG PO TB12: 500.0000 mg | ORAL_TABLET | Freq: Every day | ORAL | Status: DC

## 2014-05-14 NOTE — Patient Instructions (Addendum)
Remote monitoring is used to monitor your pacemaker from home. This monitoring reduces the number of office visits required to check your device to one time per year. It allows [place] to keep an eye on the functioning of your device to ensure it is working properly. You are scheduled for a device check from home on 08/13/2014. You may send your transmission at any time that day. If you have a wireless device, the transmission will be sent automatically. After your physician reviews your transmission, you will receive a postcard with your next transmission date.  Your physician recommends that you schedule a follow-up appointment in: 12 months with Dr.Croitoru

## 2014-05-16 NOTE — Progress Notes (Signed)
Patient ID: [name], female   DOB: December 22, 1920, 81 y.o.   MRN: [id number]      Cardiology Office Note   Date:  05/16/2014   ID:  [name], DOB 01/09/1921, MRN [id number]  PCP:  Kevan Ny, MD  Cardiologist:   Sanda Klein, MD   chief complaint: Pacemaker check (complete heart block), CAD s/p CABG, hypertension, history of pulmonary embolism     History of Present Illness: [name] is a 81 y.o. female who presents for follow-up on multiple cardio vascular problems including a pacemaker check.  She has extensive coronary disease and underwent bypass surgery 1994 [name] had all her bypass grafts patent in 2007 but has subsequently shown evidence of ischemic injury, managed medically. Now has moderate to severely depressed left ventricle systolic function.  Her Medtronic Adapta dual-chamber pacemaker implant in 2008 is working normally. She is pacemaker dependent secondary to complete heart block but does have a very slow ventricular escape rhythm at about 30 bpm. Battery voltage suggests roughly 16 months longevity (5-27 months). She has 99% ventricular pacing and roughly 43% atrial pacing, comparable to previous downloads. Lead parameters are all good. She has had 38 episodes of mode switch all of them less than a minute in duration without any available intracardiac electrograms for review. The overall burden of atrial arrhythmia is well under 0.1%.  In January she was admitted with what sounds like possible septic shock secondary to urinary tract infection, pancreatitis and had transient renal failure and abnormal liver function tests presumably secondary to hypotension. Cardiac troponin was only marginally elevated in a plateau pattern not consistent with an acute coronary event.  Last August she had a nuclear perfusion study showed a large and severe inferoapical defect that was only partly reversible (not much change from her previous nuclear perfusion  images). Left ventricular ejection fraction was reported at 35%, but she did not have manifestations of heart failure at that time. Her most recent echocardiogram in January 2016 described left ventricular ejection fraction is moderate to severely depressed with wall motion abnormalities that generally matched the nuclear report. She has not undergone cardiac catheterization since 2007. At that time left ventricular ejection fraction was estimated at 50%. She had extensive disease in the native coronary arteries but all bypass grafts (LIMA to LAD, SVG to PDA, SVG to OM1, SVG to second diagonal) were still patent.  Due to her advanced age, numerous comorbid conditions including renal insufficiency and recurrent GI bleeding the decision was made to pursue medical management only, despite the likelihood that at least some of her bypass conduits have occluded..  She continues to have occasional problems with varying types and locations of chest and back pain. Some of them sound musculoskeletal and generally do not sound like either pulmonary infarction or angina pectoris.  She is obese and mostly wheelchair-bound except for short distances. She is legally blind. Despite this remains very active and engaged socially. She has a large family and is active in church. Overall she appears to have good quality of life     Past Medical History  Diagnosis Date  . Hypertension   . Coronary artery disease   . Legally blind   . Chronic kidney disease   . Anemia   . Hypothyroid   . Gastric ulcer   . Hiatal hernia   . Dyspepsia   . UTI (lower urinary tract infection)   . H/O: GI bleed 12//13  . Diastolic dysfunction   . Pacemaker   .  High cholesterol   . CHF (congestive heart failure)   . Anginal pain   . MI (myocardial infarction) ? 1994  . DVT of upper extremity (deep vein thrombosis) 06/13/2012    BUE  . Seasonal allergies   . Allergy to perfume     "strong perfumes only" (06/13/2012)  . Shortness  of breath     "can happen at any time" (06/13/2012)  . Type II diabetes mellitus   . History of blood transfusion 2013    "not related to OR; BP had dropped" (06/13/2012)  . GERD (gastroesophageal reflux disease)   . Daily headache   . Arthritis     "all over" (06/13/2012)  . A-fib   . 2Nd degree atrioventricular block   . Abnormal nuclear stress test, 08/2013 09/17/2013    Past Surgical History  Procedure Laterality Date  . Esophagogastroduodenoscopy  12/22/2011    Procedure: ESOPHAGOGASTRODUODENOSCOPY (EGD);  Surgeon: Carl E Gessner, MD;  Location: [facility];  Service: Endoscopy;  Laterality: N/A;  . Cardioversion  06/06/06    successful  . Insert / replace / remove pacemaker  06/29/2006    Medtronic adapta  . Cardiac catheterization  10/25/92  . Cardiac catheterization  11/18/03    w/grafts 100%CX LAD 80 & 100%  . Cardiac catheterization  01/24/05    diffuse disease of native vessels  . Cardiac catheterization  06/06/06    severe native CAD  . Coronary artery bypass graft  10/27/92    LIMA to LAD,SVG to LAD second diagonal,obtuse maraginal of the CX and posterior descendingbranch of the RCA  . Cataract extraction w/ intraocular lens  implant, bilateral Bilateral   . Refractive surgery Bilateral      Current Outpatient Prescriptions  Medication Sig Dispense Refill  . ACCU-CHEK AVIVA PLUS test strip Use to check blood sugars 2 times daily 200 each 1  . acetaminophen-codeine (TYLENOL #3) 300-30 MG per tablet Take 2 tablets by mouth as needed.   0  . aspirin 81 MG chewable tablet Chew 81 mg by mouth daily.    . atropine 1 % ophthalmic solution Place 1 drop into the right eye 4 (four) times daily.   0  . Blood Glucose Monitoring Suppl (ACCU-CHEK AVIVA PLUS) W/DEVICE KIT Use to check blood sugar 2 times per day dx code 250.00 1 kit 0  . cholecalciferol (VITAMIN D) 1000 UNITS tablet Take 1,000 Units by mouth daily.    . feeding supplement, GLUCERNA SHAKE, (GLUCERNA SHAKE) LIQD Take 237  mLs by mouth 2 (two) times daily between meals.    . folic acid (FOLVITE) 1 MG tablet Take 1 tablet (1 mg total) by mouth daily. 90 tablet 3  . furosemide (LASIX) 40 MG tablet Take 0.5 tablets (20 mg total) by mouth daily. 30 tablet 6  . gabapentin (NEURONTIN) 100 MG capsule Take 100 mg by mouth 2 (two) times daily.     . glipiZIDE (GLUCOTROL) 5 MG tablet Take one-half tablet by  mouth daily only if blood  sugar is over 200 45 tablet 1  . guaiFENesin (MUCINEX) 600 MG 12 hr tablet Take 600 mg by mouth 2 (two) times daily as needed.     . hydrocortisone (ANUSOL-HC) 25 MG suppository Use 1 suppository rectally at bedtime for 7 days. 7 suppository 1  . Hyoscyamine Sulfate 0.375 MG TBCR A1 tab twice a day as needed, abdominal pain 25 tablet 1  . Insulin Glargine (LANTUS SOLOSTAR) 100 UNIT/ML Solostar Pen Inject 10 Units into the skin   daily at 10 pm. (Patient taking differently: Inject 6 Units into the skin every morning. ) 5 pen 1  . Lancets (ACCU-CHEK MULTICLIX) lancets Use as instructed to check blood sugar 2 times per day dx code 250.00 200 each 1  . Lancets Misc. (ACCU-CHEK FASTCLIX LANCET) KIT Use as directec 1 kit 1  . levothyroxine (SYNTHROID, LEVOTHROID) 50 MCG tablet Take 1 tablet (50 mcg total) by mouth daily before breakfast. 30 tablet 5  . loratadine (CLARITIN) 10 MG tablet Take 10 mg by mouth daily.    . meclizine (ANTIVERT) 25 MG tablet Take 25 mg by mouth 3 (three) times daily as needed for dizziness.    . metoprolol tartrate (LOPRESSOR) 25 MG tablet Take 1 tablet (25 mg total) by mouth 2 (two) times daily. (Patient taking differently: Take 25 mg by mouth as needed. ) 90 tablet 6  . nitroGLYCERIN (NITRODUR - DOSED IN MG/24 HR) 0.2 mg/hr patch Place 1 patch (0.2 mg total) onto the skin daily. 30 patch 6  . omeprazole (PRILOSEC) 20 MG capsule Take 1 capsule (20 mg total) by mouth daily. 90 capsule 3  . Polyvinyl Alcohol-Povidone (REFRESH OP) Apply 1 drop to eye daily.    . potassium  chloride SA (K-DUR,KLOR-CON) 20 MEQ tablet Take 20 mEq by mouth every other day.    . ranolazine ([name]) 500 MG 12 hr tablet Take 1 tablet (500 mg total) by mouth daily. 56 tablet 0  . rosuvastatin (CRESTOR) 10 MG tablet Take 1 tablet (10 mg total) by mouth 3 (three) times a week. OIB:[id number] Exp: 8-18 21 tablet 0  . senna-docusate (SENOKOT-S) 8.6-50 MG per tablet Take 1 tablet by mouth 2 (two) times daily. (Patient taking differently: Take 1 tablet by mouth as needed. ) 30 tablet 2  . tobramycin (TOBREX) 0.3 % ophthalmic solution Place 1 drop into the right eye daily as needed.     . traMADol (ULTRAM) 50 MG tablet Take 1 tablet (50 mg total) by mouth every 6 (six) hours as needed for moderate pain. 30 tablet 0  . Vitamin D, Ergocalciferol, (DRISDOL) 50000 UNITS CAPS capsule Take 50,000 Units by mouth daily.     Marland Kitchen warfarin (COUMADIN) 2.5 MG tablet Take 1 to 1.5 tablets by mouth daily as directed by coumadin clinic 40 tablet 2  . zolpidem (AMBIEN) 10 MG tablet Take 5 mg by mouth at bedtime as needed. sleep     No current facility-administered medications for this visit.    Allergies:   Darvon; Digoxin and related; Penicillins; Percocet; Percodan; and Vicodin    Social History:  The patient  reports that she has never smoked. She has never used smokeless tobacco. She reports that she does not drink alcohol or use illicit drugs.   Family History:  The patient's family history includes Breast cancer in her daughter; Colon polyps in her daughter; Diabetes in her brother, daughter, father, and son; Heart attack in her brother; Heart disease in her father; Stroke in her sister.    ROS:  Please see the history of present illness.    She denies dyspnea at rest or with the limited exertion that she is able to perform, denies palpitations, syncope, exertional chest discomfort. She continues to have occasional discomfort in the left upper quadrant of her abdomen radiating up towards her left shoulder and  sometimes also hurts in her right shoulder. She is completely blind in her right eye and has very poor residual vision in her left eye although she states  she is able to recognize faces. She has not had any serious bleeding problems after discontinuation of warfarin All other systems are reviewed and negative.    PHYSICAL EXAM: VS:  BP 118/62 mmHg  Pulse 62  Ht 5' 4" (1.626 m)  Wt 190 lb 8 oz (86.41 kg)  BMI 32.68 kg/m2 , BMI Body mass index is 32.68 kg/(m^2).  General: Alert, oriented x3, no distress, moderately obese  Head: no evidence of trauma, PERRL, EOMI, no exophtalmos or lid lag, no myxedema, no xanthelasma; normal ears, nose and oropharynx  Neck: normal jugular venous pulsations and no hepatojugular reflux; brisk carotid pulses without delay and no carotid bruits  Chest: clear to auscultation, no signs of consolidation by percussion or palpation, normal fremitus, symmetrical and full respiratory excursions , healthy pacemaker site Cardiovascular: normal position and quality of the apical impulse, regular rhythm, normal first and second heart sounds, no murmurs, rubs or gallops  Abdomen: no tenderness or distention, no masses by palpation, no abnormal pulsatility or arterial bruits, normal bowel sounds, no hepatosplenomegaly  Extremities: no clubbing, cyanosis; 1+ symmetrical pitting ankle edema; 2+ radial, ulnar and brachial pulses bilaterally; beneath the femoral level it is hard to palpate any pulses in her lower extremities; no subclavian or femoral bruits  Neurological: grossly nonfocal, poor vision Psych: euthymic mood, full affect  EKG:  EKG is not ordered today.  Recent Labs: 02/12/2014: ALT 69*; Hemoglobin 12.4; Platelets 227 04/16/2014: BUN 12; Creatinine 1.26*; Potassium 4.6; Sodium 136; TSH 6.25*    Lipid Panel    Component Value Date/Time   CHOL 170 10/24/2013 1006   TRIG 201.0* 10/24/2013 1006   HDL 30.50* 10/24/2013 1006   CHOLHDL 6 10/24/2013 1006    VLDL 40.2* 10/24/2013 1006   LDLCALC 61 06/24/2013 1127   LDLDIRECT 92.3 10/24/2013 1006      Wt Readings from Last 3 Encounters:  05/14/14 190 lb 8 oz (86.41 kg)  02/12/14 196 lb (88.905 kg)  02/10/14 198 lb (89.812 kg)     ASSESSMENT AND PLAN:  1. Complete heart block, pacemaker dependent 2. Dual-chamber permanent pacemaker with normal function 3. Moderate to severe ischemic cardiomyopathy 4. CAD status post CABG 1994 - I'm not sure if any of the variety of chest pains that she describes does indeed represent angina. She is known to have ischemia in the inferior wall and likely has lost 1 or more of her bypass conduits. We'll continue medical management. She is very sedentary and her chest discomfort does not appear to be in any way limiting. Focus on maintaining good quality of life. 5. Chronic systolic heart failure, as far as I can tell currently euvolemic. Her sedentary lifestyle precludes functional status assessment 6. History of paroxysmal atrial fibrillation without any pacemaker recorded recurrences in a very long time 7. Remote history of lower extremity DVT and previous catheter associated upper extremity DVT, currently asymptomatic. No longer on warfarin due to severe recurrent GI bleeding requiring transfusions. 8. Obesity and type 2 diabetes mellitus with multiple complications including blindness and vascular disease   Current medicines are reviewed at length with the patient today.  The patient does not have concerns regarding medicines.  The following changes have been made:  no change  Labs/ tests ordered today include:  Orders Placed This Encounter  Procedures  . Implantable device check   Patient Instructions  Remote monitoring is used to monitor your pacemaker from home. This monitoring reduces the number of office visits required to check your device   to one time per year. It allows [place] to keep an eye on the functioning of your device to ensure it is working  properly. You are scheduled for a device check from home on 08/13/2014. You may send your transmission at any time that day. If you have a wireless device, the transmission will be sent automatically. After your physician reviews your transmission, you will receive a postcard with your next transmission date.  Your physician recommends that you schedule a follow-up appointment in: 12 months with Dr.Elpidia Karn    Signed, Sanda Klein, MD  05/16/2014 4:02 PM    Sanda Klein, MD, [facility] [phone] office [phone] pager

## 2014-05-18 ENCOUNTER — Other Ambulatory Visit: Payer: Self-pay | Admitting: *Deleted

## 2014-05-18 MED ORDER — GLIPIZIDE 5 MG PO TABS: ORAL_TABLET | ORAL | Status: DC

## 2014-05-19 ENCOUNTER — Ambulatory Visit (INDEPENDENT_AMBULATORY_CARE_PROVIDER_SITE_OTHER): Payer: Medicare Other | Admitting: Pharmacist Clinician (PhC)/ Clinical Pharmacy Specialist

## 2014-05-19 DIAGNOSIS — I82409 Acute embolism and thrombosis of unspecified deep veins of unspecified lower extremity: Secondary | ICD-10-CM | POA: Diagnosis not present

## 2014-05-19 DIAGNOSIS — Z7901 Long term (current) use of anticoagulants: Secondary | ICD-10-CM | POA: Diagnosis not present

## 2014-05-19 LAB — POCT INR: INR: 2.3

## 2014-05-20 ENCOUNTER — Telehealth: Payer: Self-pay | Admitting: Gastroenterology

## 2014-05-21 NOTE — Telephone Encounter (Signed)
No answer. No voicemail. 

## 2014-05-21 NOTE — Telephone Encounter (Signed)
Spoke with [name]. Patient is having abdominal discomfort. Patient is 81 years old. She will accompany the patient. Appointment for 05/27/14

## 2014-05-24 ENCOUNTER — Observation Stay (HOSPITAL_COMMUNITY)
Admission: EM | Admit: 2014-05-24 | Discharge: 2014-05-29 | Disposition: A | Discharge status: Home / Self Care | Payer: Medicare Other | Attending: Family Medicine | Admitting: Family Medicine

## 2014-05-24 ENCOUNTER — Other Ambulatory Visit (HOSPITAL_COMMUNITY): Payer: Self-pay

## 2014-05-24 ENCOUNTER — Emergency Department (HOSPITAL_COMMUNITY): Payer: Medicare Other

## 2014-05-24 ENCOUNTER — Encounter (HOSPITAL_COMMUNITY): Payer: Self-pay | Admitting: *Deleted

## 2014-05-24 DIAGNOSIS — Z794 Long term (current) use of insulin: Secondary | ICD-10-CM | POA: Insufficient documentation

## 2014-05-24 DIAGNOSIS — H5441 Blindness, right eye, normal vision left eye: Secondary | ICD-10-CM | POA: Diagnosis not present

## 2014-05-24 DIAGNOSIS — I255 Ischemic cardiomyopathy: Secondary | ICD-10-CM | POA: Diagnosis not present

## 2014-05-24 DIAGNOSIS — E1165 Type 2 diabetes mellitus with hyperglycemia: Secondary | ICD-10-CM | POA: Diagnosis not present

## 2014-05-24 DIAGNOSIS — N179 Acute kidney failure, unspecified: Secondary | ICD-10-CM | POA: Diagnosis not present

## 2014-05-24 DIAGNOSIS — I5033 Acute on chronic diastolic (congestive) heart failure: Secondary | ICD-10-CM

## 2014-05-24 DIAGNOSIS — I10 Essential (primary) hypertension: Secondary | ICD-10-CM

## 2014-05-24 DIAGNOSIS — I82729 Chronic embolism and thrombosis of deep veins of unspecified upper extremity: Secondary | ICD-10-CM | POA: Insufficient documentation

## 2014-05-24 DIAGNOSIS — Z950 Presence of cardiac pacemaker: Secondary | ICD-10-CM | POA: Diagnosis present

## 2014-05-24 DIAGNOSIS — J9601 Acute respiratory failure with hypoxia: Secondary | ICD-10-CM | POA: Diagnosis not present

## 2014-05-24 DIAGNOSIS — E039 Hypothyroidism, unspecified: Secondary | ICD-10-CM | POA: Diagnosis not present

## 2014-05-24 DIAGNOSIS — Z7901 Long term (current) use of anticoagulants: Secondary | ICD-10-CM

## 2014-05-24 DIAGNOSIS — I129 Hypertensive chronic kidney disease with stage 1 through stage 4 chronic kidney disease, or unspecified chronic kidney disease: Secondary | ICD-10-CM | POA: Diagnosis not present

## 2014-05-24 DIAGNOSIS — N390 Urinary tract infection, site not specified: Secondary | ICD-10-CM

## 2014-05-24 DIAGNOSIS — Z7982 Long term (current) use of aspirin: Secondary | ICD-10-CM | POA: Insufficient documentation

## 2014-05-24 DIAGNOSIS — I82509 Chronic embolism and thrombosis of unspecified deep veins of unspecified lower extremity: Secondary | ICD-10-CM | POA: Diagnosis not present

## 2014-05-24 DIAGNOSIS — Z951 Presence of aortocoronary bypass graft: Secondary | ICD-10-CM | POA: Diagnosis present

## 2014-05-24 DIAGNOSIS — I442 Atrioventricular block, complete: Secondary | ICD-10-CM | POA: Insufficient documentation

## 2014-05-24 DIAGNOSIS — I509 Heart failure, unspecified: Secondary | ICD-10-CM | POA: Diagnosis present

## 2014-05-24 DIAGNOSIS — I2510 Atherosclerotic heart disease of native coronary artery without angina pectoris: Secondary | ICD-10-CM | POA: Diagnosis not present

## 2014-05-24 DIAGNOSIS — D649 Anemia, unspecified: Secondary | ICD-10-CM | POA: Diagnosis not present

## 2014-05-24 DIAGNOSIS — R1032 Left lower quadrant pain: Secondary | ICD-10-CM

## 2014-05-24 DIAGNOSIS — I480 Paroxysmal atrial fibrillation: Secondary | ICD-10-CM | POA: Diagnosis not present

## 2014-05-24 DIAGNOSIS — R0602 Shortness of breath: Secondary | ICD-10-CM | POA: Diagnosis not present

## 2014-05-24 DIAGNOSIS — I5043 Acute on chronic combined systolic (congestive) and diastolic (congestive) heart failure: Principal | ICD-10-CM | POA: Insufficient documentation

## 2014-05-24 DIAGNOSIS — N183 Chronic kidney disease, stage 3 unspecified: Secondary | ICD-10-CM

## 2014-05-24 DIAGNOSIS — R7989 Other specified abnormal findings of blood chemistry: Secondary | ICD-10-CM

## 2014-05-24 LAB — I-STAT TROPONIN, ED: TROPONIN I, POC: 0 ng/mL (ref 0.00–0.08)

## 2014-05-24 LAB — BASIC METABOLIC PANEL
ANION GAP: 10 (ref 5–15)
BUN: 11 mg/dL (ref 6–20)
CO2: 25 mmol/L (ref 22–32)
CREATININE: 1.01 mg/dL — AB (ref 0.44–1.00)
Calcium: 9 mg/dL (ref 8.9–10.3)
Chloride: 99 mmol/L — ABNORMAL LOW (ref 101–111)
GFR, EST AFRICAN AMERICAN: 55 mL/min — AB (ref >60)
GFR, EST NON AFRICAN AMERICAN: 47 mL/min — AB (ref >60)
Glucose, Bld: 204 mg/dL — ABNORMAL HIGH (ref 70–99)
Potassium: 4.5 mmol/L (ref 3.5–5.1)
SODIUM: 134 mmol/L — AB (ref 135–145)

## 2014-05-24 LAB — GLUCOSE, CAPILLARY
GLUCOSE-CAPILLARY: 129 mg/dL — AB (ref 70–99)
Glucose-Capillary: 239 mg/dL — ABNORMAL HIGH (ref 70–99)
Glucose-Capillary: 156 mg/dL — ABNORMAL HIGH (ref 70–99)

## 2014-05-24 LAB — CBC WITH DIFFERENTIAL/PLATELET
Basophils Absolute: 0 10*3/uL (ref 0.0–0.1)
Basophils Relative: 0 % (ref 0–1)
Eosinophils Absolute: 0.1 10*3/uL (ref 0.0–0.7)
Eosinophils Relative: 1 % (ref 0–5)
HEMATOCRIT: 39.8 % (ref 36.0–46.0)
HEMOGLOBIN: 12.5 g/dL (ref 12.0–15.0)
LYMPHS ABS: 1 10*3/uL (ref 0.7–4.0)
Lymphocytes Relative: 6 % — ABNORMAL LOW (ref 12–46)
MCH: 28.5 pg (ref 26.0–34.0)
MCHC: 31.4 g/dL (ref 30.0–36.0)
MCV: 90.9 fL (ref 78.0–100.0)
MONO ABS: 0.4 10*3/uL (ref 0.1–1.0)
Monocytes Relative: 2 % — ABNORMAL LOW (ref 3–12)
Neutro Abs: 16.4 10*3/uL — ABNORMAL HIGH (ref 1.7–7.7)
Neutrophils Relative %: 91 % — ABNORMAL HIGH (ref 43–77)
Platelets: 266 10*3/uL (ref 150–400)
RBC: 4.38 MIL/uL (ref 3.87–5.11)
RDW: 13.8 % (ref 11.5–15.5)
WBC: 17.9 10*3/uL — ABNORMAL HIGH (ref 4.0–10.5)

## 2014-05-24 LAB — PROTIME-INR
INR: 2.68 — AB (ref 0.00–1.49)
Prothrombin Time: 28.8 seconds — ABNORMAL HIGH (ref 11.6–15.2)

## 2014-05-24 LAB — TROPONIN I: TROPONIN I: 0.07 ng/mL — AB (ref <0.031)

## 2014-05-24 LAB — BRAIN NATRIURETIC PEPTIDE: B Natriuretic Peptide: 169.5 pg/mL — ABNORMAL HIGH (ref 0.0–100.0)

## 2014-05-24 MED ORDER — LISINOPRIL 2.5 MG PO TABS
2.5000 mg | ORAL_TABLET | Freq: Every day | ORAL | Status: DC
  Administered 2014-05-24: 2.5 mg via ORAL
  Filled 2014-05-24 (×2): qty 1

## 2014-05-24 MED ORDER — ONDANSETRON HCL 4 MG PO TABS: 4.0000 mg | ORAL_TABLET | Freq: Four times a day (QID) | ORAL | Status: DC | PRN

## 2014-05-24 MED ORDER — DONEPEZIL HCL 5 MG PO TABS
5.0000 mg | ORAL_TABLET | Freq: Every day | ORAL | Status: DC
  Administered 2014-05-24 – 2014-05-28 (×5): 5 mg via ORAL
  Filled 2014-05-24 (×6): qty 1

## 2014-05-24 MED ORDER — GABAPENTIN 100 MG PO CAPS
100.0000 mg | ORAL_CAPSULE | Freq: Two times a day (BID) | ORAL | Status: DC
  Administered 2014-05-24 – 2014-05-29 (×11): 100 mg via ORAL
  Filled 2014-05-24 (×12): qty 1

## 2014-05-24 MED ORDER — METOPROLOL TARTRATE 25 MG PO TABS
25.0000 mg | ORAL_TABLET | Freq: Two times a day (BID) | ORAL | Status: DC
  Administered 2014-05-24 – 2014-05-29 (×11): 25 mg via ORAL
  Filled 2014-05-24 (×12): qty 1

## 2014-05-24 MED ORDER — ACETAMINOPHEN 650 MG RE SUPP: 650.0000 mg | Freq: Four times a day (QID) | RECTAL | Status: DC | PRN

## 2014-05-24 MED ORDER — SODIUM CHLORIDE 0.9 % IJ SOLN
3.0000 mL | Freq: Two times a day (BID) | INTRAMUSCULAR | Status: DC
  Administered 2014-05-24 – 2014-05-29 (×10): 3 mL via INTRAVENOUS

## 2014-05-24 MED ORDER — PANTOPRAZOLE SODIUM 40 MG PO TBEC
40.0000 mg | DELAYED_RELEASE_TABLET | Freq: Every day | ORAL | Status: DC
  Administered 2014-05-24 – 2014-05-29 (×6): 40 mg via ORAL
  Filled 2014-05-24 (×5): qty 1

## 2014-05-24 MED ORDER — WARFARIN SODIUM 2.5 MG PO TABS
2.5000 mg | ORAL_TABLET | ORAL | Status: DC
  Filled 2014-05-24: qty 1

## 2014-05-24 MED ORDER — SODIUM CHLORIDE 0.9 % IV SOLN: 250.0000 mL | INTRAVENOUS | Status: DC | PRN

## 2014-05-24 MED ORDER — ATROPINE SULFATE 1 % OP SOLN
1.0000 [drp] | Freq: Four times a day (QID) | OPHTHALMIC | Status: DC
  Administered 2014-05-24 – 2014-05-29 (×22): 1 [drp] via OPHTHALMIC
  Filled 2014-05-24: qty 2

## 2014-05-24 MED ORDER — WARFARIN - PHARMACIST DOSING INPATIENT
Freq: Every day | Status: DC
  Administered 2014-05-25 – 2014-05-28 (×4)

## 2014-05-24 MED ORDER — ACETAMINOPHEN 325 MG PO TABS: 650.0000 mg | ORAL_TABLET | ORAL | Status: DC | PRN

## 2014-05-24 MED ORDER — LORATADINE 10 MG PO TABS
10.0000 mg | ORAL_TABLET | Freq: Every day | ORAL | Status: DC
  Administered 2014-05-24 – 2014-05-29 (×6): 10 mg via ORAL
  Filled 2014-05-24 (×6): qty 1

## 2014-05-24 MED ORDER — ROSUVASTATIN CALCIUM 10 MG PO TABS
10.0000 mg | ORAL_TABLET | ORAL | Status: DC
  Administered 2014-05-25 – 2014-05-27 (×2): 10 mg via ORAL
  Filled 2014-05-24 (×3): qty 1

## 2014-05-24 MED ORDER — ONDANSETRON HCL 4 MG/2ML IJ SOLN: 4.0000 mg | Freq: Four times a day (QID) | INTRAMUSCULAR | Status: DC | PRN

## 2014-05-24 MED ORDER — SODIUM CHLORIDE 0.9 % IJ SOLN
3.0000 mL | Freq: Two times a day (BID) | INTRAMUSCULAR | Status: DC
  Administered 2014-05-24: 3 mL via INTRAVENOUS

## 2014-05-24 MED ORDER — TOBRAMYCIN 0.3 % OP SOLN
1.0000 [drp] | Freq: Every day | OPHTHALMIC | Status: AC
  Administered 2014-05-24 – 2014-05-28 (×5): 1 [drp] via OPHTHALMIC
  Filled 2014-05-24: qty 5

## 2014-05-24 MED ORDER — FUROSEMIDE 10 MG/ML IJ SOLN
40.0000 mg | Freq: Once | INTRAMUSCULAR | Status: AC
  Administered 2014-05-24: 40 mg via INTRAVENOUS
  Filled 2014-05-24: qty 4

## 2014-05-24 MED ORDER — ZOLPIDEM TARTRATE 5 MG PO TABS
5.0000 mg | ORAL_TABLET | Freq: Every evening | ORAL | Status: DC | PRN
  Administered 2014-05-24 – 2014-05-28 (×5): 5 mg via ORAL
  Filled 2014-05-24 (×5): qty 1

## 2014-05-24 MED ORDER — MORPHINE SULFATE 2 MG/ML IJ SOLN
1.0000 mg | INTRAMUSCULAR | Status: DC | PRN
  Administered 2014-05-27: 1 mg via INTRAVENOUS
  Filled 2014-05-24 (×2): qty 1

## 2014-05-24 MED ORDER — SODIUM CHLORIDE 0.9 % IJ SOLN: 3.0000 mL | INTRAMUSCULAR | Status: DC | PRN

## 2014-05-24 MED ORDER — FOLIC ACID 1 MG PO TABS
1.0000 mg | ORAL_TABLET | Freq: Every day | ORAL | Status: DC
  Administered 2014-05-24 – 2014-05-29 (×6): 1 mg via ORAL
  Filled 2014-05-24 (×6): qty 1

## 2014-05-24 MED ORDER — INSULIN GLARGINE 100 UNIT/ML SOLOSTAR PEN: 6.0000 [IU] | PEN_INJECTOR | Freq: Every morning | SUBCUTANEOUS | Status: DC

## 2014-05-24 MED ORDER — LEVOTHYROXINE SODIUM 50 MCG PO TABS
50.0000 ug | ORAL_TABLET | Freq: Every day | ORAL | Status: DC
  Administered 2014-05-24 – 2014-05-29 (×6): 50 ug via ORAL
  Filled 2014-05-24 (×7): qty 1

## 2014-05-24 MED ORDER — FUROSEMIDE 10 MG/ML IJ SOLN
60.0000 mg | Freq: Two times a day (BID) | INTRAMUSCULAR | Status: DC
  Administered 2014-05-24 (×2): 60 mg via INTRAVENOUS
  Filled 2014-05-24 (×3): qty 6

## 2014-05-24 MED ORDER — WARFARIN SODIUM 2.5 MG PO TABS
3.7500 mg | ORAL_TABLET | ORAL | Status: DC
  Filled 2014-05-24: qty 1

## 2014-05-24 MED ORDER — ACETAMINOPHEN 325 MG PO TABS
650.0000 mg | ORAL_TABLET | Freq: Four times a day (QID) | ORAL | Status: DC | PRN
  Administered 2014-05-24 – 2014-05-29 (×10): 650 mg via ORAL
  Filled 2014-05-24 (×10): qty 2

## 2014-05-24 MED ORDER — WARFARIN SODIUM 2.5 MG PO TABS
2.5000 mg | ORAL_TABLET | ORAL | Status: DC
  Administered 2014-05-24: 2.5 mg via ORAL

## 2014-05-24 MED ORDER — ALUM & MAG HYDROXIDE-SIMETH 200-200-20 MG/5ML PO SUSP: 30.0000 mL | Freq: Four times a day (QID) | ORAL | Status: DC | PRN

## 2014-05-24 MED ORDER — GLUCERNA SHAKE PO LIQD
237.0000 mL | Freq: Two times a day (BID) | ORAL | Status: DC
  Administered 2014-05-24 – 2014-05-29 (×12): 237 mL via ORAL

## 2014-05-24 MED ORDER — RANOLAZINE ER 500 MG PO TB12
500.0000 mg | ORAL_TABLET | Freq: Every day | ORAL | Status: DC
  Administered 2014-05-24 – 2014-05-29 (×6): 500 mg via ORAL
  Filled 2014-05-24 (×6): qty 1

## 2014-05-24 MED ORDER — NITROGLYCERIN 0.2 MG/HR TD PT24
0.2000 mg | MEDICATED_PATCH | Freq: Every day | TRANSDERMAL | Status: DC
  Administered 2014-05-24: 0.2 mg via TRANSDERMAL
  Filled 2014-05-24: qty 1

## 2014-05-24 MED ORDER — ASPIRIN 81 MG PO CHEW
81.0000 mg | CHEWABLE_TABLET | Freq: Every day | ORAL | Status: DC
  Administered 2014-05-24 – 2014-05-29 (×6): 81 mg via ORAL
  Filled 2014-05-24 (×6): qty 1

## 2014-05-24 MED ORDER — INSULIN ASPART 100 UNIT/ML ~~LOC~~ SOLN: 0.0000 [IU] | Freq: Every day | SUBCUTANEOUS | Status: DC

## 2014-05-24 MED ORDER — POTASSIUM CHLORIDE CRYS ER 10 MEQ PO TBCR
10.0000 meq | EXTENDED_RELEASE_TABLET | Freq: Every day | ORAL | Status: DC
  Administered 2014-05-24 – 2014-05-29 (×6): 10 meq via ORAL
  Filled 2014-05-24 (×6): qty 1

## 2014-05-24 MED ORDER — WARFARIN - PHYSICIAN DOSING INPATIENT: Freq: Every day | Status: DC

## 2014-05-24 MED ORDER — INSULIN ASPART 100 UNIT/ML ~~LOC~~ SOLN
0.0000 [IU] | Freq: Three times a day (TID) | SUBCUTANEOUS | Status: DC
  Administered 2014-05-24: 3 [IU] via SUBCUTANEOUS
  Administered 2014-05-25 – 2014-05-27 (×5): 1 [IU] via SUBCUTANEOUS
  Administered 2014-05-28: 2 [IU] via SUBCUTANEOUS

## 2014-05-24 MED ORDER — INSULIN GLARGINE 100 UNIT/ML ~~LOC~~ SOLN
6.0000 [IU] | Freq: Every day | SUBCUTANEOUS | Status: DC
  Administered 2014-05-24 – 2014-05-29 (×6): 6 [IU] via SUBCUTANEOUS
  Filled 2014-05-24 (×6): qty 0.06

## 2014-05-24 NOTE — Progress Notes (Signed)
ANTICOAGULATION CONSULT NOTE - Initial Consult  Pharmacy Consult for warfarin Indication: atrial fibrillation  Allergies  Allergen Reactions  . Darvon     Unknown reaction   . Digoxin And Related Nausea Only  . Penicillins Other (See Comments)    Was told had allergy from childhood... Unknown reaction  . Percocet [Oxycodone-Acetaminophen] Other (See Comments)    confused  . Percodan [Oxycodone-Aspirin]   . Vicodin [Hydrocodone-Acetaminophen] Other (See Comments)    confused    Patient Measurements: Height: 5\' 4"  (162.6 cm) Weight: 186 lb 6.4 oz (84.55 kg) IBW/kg (Calculated) : 54.7  Vital Signs: Temp: 98 F (36.7 C) (05/08 1430) Temp Source: Oral (05/08 1430) BP: 118/57 mmHg (05/08 1623) Pulse Rate: 75 (05/08 1623)  Labs:  Recent Labs  05/24/14 0145 05/24/14 1000  HGB 12.5  --   HCT 39.8  --   PLT 266  --   LABPROT  --  28.8*  INR  --  2.68*  CREATININE 1.01*  --     Estimated Creatinine Clearance: 38.2 mL/min (by C-G formula based on Cr of 1.01).   Medical History: Past Medical History  Diagnosis Date  . Hypertension   . Coronary artery disease   . Legally blind   . Chronic kidney disease   . Anemia   . Hypothyroid   . Gastric ulcer   . Hiatal hernia   . Dyspepsia   . UTI (lower urinary tract infection)   . H/O: GI bleed 12//13  . Diastolic dysfunction   . Pacemaker   . High cholesterol   . CHF (congestive heart failure)   . Anginal pain   . MI (myocardial infarction) ? 1994  . DVT of upper extremity (deep vein thrombosis) 06/13/2012    BUE  . Seasonal allergies   . Allergy to perfume     "strong perfumes only" (06/13/2012)  . Shortness of breath     "can happen at any time" (06/13/2012)  . Type II diabetes mellitus   . History of blood transfusion 2013    "not related to OR; BP had dropped" (06/13/2012)  . GERD (gastroesophageal reflux disease)   . Daily headache   . Arthritis     "all over" (06/13/2012)  . A-fib   . 2Nd degree  atrioventricular block   . Abnormal nuclear stress test, 08/2013 09/17/2013  Assessment: 81 y.o. female Chronic systolic CHF, A.Fib on Coumadin Rx, CAD S/P CABG, S/P Pacemaker, DM2 and HTN who presents to the ED with complaints of worsening SOB since 8 pm.  INR on admission is 2.68 (at goal) no bleeding issues on arrival. CBC within normal limits.  Warfarin dose pta: 3.75mg  daily except 2.5mg  on Sunday and Wednesday.  Goal of Therapy:  INR 2-3 Monitor platelets by anticoagulation protocol: Yes   Plan:  Continue home dose - 2.5mg  tonight Daily INR for now x3  Erin Hearing PharmD., BCPS Clinical Pharmacist Pager [phone] 05/24/2014 4:46 PM

## 2014-05-24 NOTE — Progress Notes (Signed)
Dr Memory Dance paged for admit orders.

## 2014-05-24 NOTE — ED Provider Notes (Signed)
CSN: [id number]     Arrival date & time 05/24/14  0938 History  This chart was scribed for Delora Fuel, MD by Evelene Croon, ED Scribe. This patient was seen in room B15C/B15C and the patient's care was started 1:10 AM.    Chief Complaint  Patient presents with  . Shortness of Breath   The history is provided by the patient and a relative (Son and daughter). No language interpreter was used.     HPI Comments:  [name] is a 81 y.o. female who presents to the Emergency Department complaining of intermittent central CP that started 3 weeks ago.  She reports associated SOB that started this evening, congestion, cough, nausea, vomiting, abdominal cramping,diaphoresis and chills. No alleviating factors noted. Pt was on an unknown antibiotic until 1 week ago when she was switched to azithromycin which she is currently talking.  Past Medical History  Diagnosis Date  . Hypertension   . Coronary artery disease   . Legally blind   . Chronic kidney disease   . Anemia   . Hypothyroid   . Gastric ulcer   . Hiatal hernia   . Dyspepsia   . UTI (lower urinary tract infection)   . H/O: GI bleed 12//13  . Diastolic dysfunction   . Pacemaker   . High cholesterol   . CHF (congestive heart failure)   . Anginal pain   . MI (myocardial infarction) ? 1994  . DVT of upper extremity (deep vein thrombosis) 06/13/2012    BUE  . Seasonal allergies   . Allergy to perfume     "strong perfumes only" (06/13/2012)  . Shortness of breath     "can happen at any time" (06/13/2012)  . Type II diabetes mellitus   . History of blood transfusion 2013    "not related to OR; BP had dropped" (06/13/2012)  . GERD (gastroesophageal reflux disease)   . Daily headache   . Arthritis     "all over" (06/13/2012)  . A-fib   . 2Nd degree atrioventricular block   . Abnormal nuclear stress test, 08/2013 09/17/2013   Past Surgical History  Procedure Laterality Date  . Esophagogastroduodenoscopy  12/22/2011     Procedure: ESOPHAGOGASTRODUODENOSCOPY (EGD);  Surgeon: Gatha Mayer, MD;  Location: [facility];  Service: Endoscopy;  Laterality: N/A;  . Cardioversion  06/06/06    successful  . Insert / replace / remove pacemaker  06/29/2006    [employer]tronic adapta  . Cardiac catheterization  10/25/92  . Cardiac catheterization  11/18/03    w/grafts 100%CX LAD 80 & 100%  . Cardiac catheterization  01/24/05    diffuse disease of native vessels  . Cardiac catheterization  06/06/06    severe native CAD  . Coronary artery bypass graft  10/27/92    LIMA to LAD,SVG to LAD second diagonal,obtuse maraginal of the CX and posterior descendingbranch of the RCA  . Cataract extraction w/ intraocular lens  implant, bilateral Bilateral   . Refractive surgery Bilateral    Family History  Problem Relation Age of Onset  . Breast cancer Daughter   . Diabetes Son   . Diabetes Daughter   . Heart disease Father   . Diabetes Father   . Colon polyps Daughter   . Heart attack Brother   . Diabetes Brother   . Stroke Sister    History  Substance Use Topics  . Smoking status: Never Smoker   . Smokeless tobacco: Never Used  . Alcohol Use: No  OB History    No data available     Review of Systems  Constitutional: Positive for chills and diaphoresis. Negative for fever.  HENT: Positive for congestion.   Respiratory: Positive for cough and shortness of breath.   Cardiovascular: Positive for chest pain.  Gastrointestinal: Positive for nausea, vomiting and abdominal pain.  All other systems reviewed and are negative.     Allergies  Darvon; Digoxin and related; Penicillins; Percocet; Percodan; and Vicodin  Home Medications   Prior to Admission medications   Medication Sig Start Date End Date Taking? Authorizing Provider  ACCU-CHEK AVIVA PLUS test strip Use to check blood sugars 2 times daily 01/19/14   Elayne Snare, MD  acetaminophen-codeine (TYLENOL #3) 300-30 MG per tablet Take 2 tablets by mouth as needed.   01/26/14   Historical Provider, MD  aspirin 81 MG chewable tablet Chew 81 mg by mouth daily.    Historical Provider, MD  atropine 1 % ophthalmic solution Place 1 drop into the right eye 4 (four) times daily.  10/29/13   Historical Provider, MD  Blood Glucose Monitoring Suppl (ACCU-CHEK AVIVA PLUS) W/DEVICE KIT Use to check blood sugar 2 times per day dx code 250.00 07/30/13   Elayne Snare, MD  cholecalciferol (VITAMIN D) 1000 UNITS tablet Take 1,000 Units by mouth daily.    Historical Provider, MD  feeding supplement, GLUCERNA SHAKE, (GLUCERNA SHAKE) LIQD Take 237 mLs by mouth 2 (two) times daily between meals.    Historical Provider, MD  folic acid (FOLVITE) 1 MG tablet Take 1 tablet (1 mg total) by mouth daily. 01/19/14   Mihai Croitoru, MD  furosemide (LASIX) 40 MG tablet Take 0.5 tablets (20 mg total) by mouth daily. 02/07/14   Reyne Dumas, MD  gabapentin (NEURONTIN) 100 MG capsule Take 100 mg by mouth 2 (two) times daily.     Historical Provider, MD  glipiZIDE (GLUCOTROL) 5 MG tablet Take one-half tablet by  mouth daily only if blood  sugar is over 200 05/18/14   Elayne Snare, MD  guaiFENesin (MUCINEX) 600 MG 12 hr tablet Take 600 mg by mouth 2 (two) times daily as needed.     Historical Provider, MD  hydrocortisone (ANUSOL-HC) 25 MG suppository Use 1 suppository rectally at bedtime for 7 days. 04/01/13   Amy S Esterwood, PA-C  Hyoscyamine Sulfate 0.375 MG TBCR A1 tab twice a day as needed, abdominal pain 06/30/13   Inda Castle, MD  Insulin Glargine (LANTUS SOLOSTAR) 100 UNIT/ML Solostar Pen Inject 10 Units into the skin daily at 10 pm. Patient taking differently: Inject 6 Units into the skin every morning.  04/16/14   Elayne Snare, MD  Lancets (ACCU-CHEK MULTICLIX) lancets Use as instructed to check blood sugar 2 times per day dx code 250.00 07/30/13   Elayne Snare, MD  Lancets Misc. (ACCU-CHEK FASTCLIX LANCET) KIT Use as directec 03/17/13   Elayne Snare, MD  levothyroxine (SYNTHROID, LEVOTHROID) 50 MCG tablet  Take 1 tablet (50 mcg total) by mouth daily before breakfast. 01/05/14   Elayne Snare, MD  loratadine (CLARITIN) 10 MG tablet Take 10 mg by mouth daily.    Historical Provider, MD  meclizine (ANTIVERT) 25 MG tablet Take 25 mg by mouth 3 (three) times daily as needed for dizziness.    Historical Provider, MD  metoprolol tartrate (LOPRESSOR) 25 MG tablet Take 1 tablet (25 mg total) by mouth 2 (two) times daily. Patient taking differently: Take 25 mg by mouth as needed.  02/07/14   Reyne Dumas, MD  nitroGLYCERIN (NITRODUR - DOSED IN MG/24 HR) 0.2 mg/hr patch Place 1 patch (0.2 mg total) onto the skin daily. 02/12/14   Erlene Quan, PA-C  omeprazole (PRILOSEC) 20 MG capsule Take 1 capsule (20 mg total) by mouth daily. 06/04/13   Mihai Croitoru, MD  Polyvinyl Alcohol-Povidone (REFRESH OP) Apply 1 drop to eye daily.    Historical Provider, MD  potassium chloride SA (K-DUR,KLOR-CON) 20 MEQ tablet Take 20 mEq by mouth every other day.    Historical Provider, MD  ranolazine ([name]) 500 MG 12 hr tablet Take 1 tablet (500 mg total) by mouth daily. 05/14/14   Mihai Croitoru, MD  rosuvastatin (CRESTOR) 10 MG tablet Take 1 tablet (10 mg total) by mouth 3 (three) times a week. [id number] Exp: 8-18 02/18/14   Luke K Kilroy, PA-C  senna-docusate (SENOKOT-S) 8.6-50 MG per tablet Take 1 tablet by mouth 2 (two) times daily. Patient taking differently: Take 1 tablet by mouth as needed.  02/07/14   Reyne Dumas, MD  tobramycin (TOBREX) 0.3 % ophthalmic solution Place 1 drop into the right eye daily as needed.  10/18/12   Historical Provider, MD  traMADol (ULTRAM) 50 MG tablet Take 1 tablet (50 mg total) by mouth every 6 (six) hours as needed for moderate pain. 05/14/14   Mihai Croitoru, MD  Vitamin D, Ergocalciferol, (DRISDOL) 50000 UNITS CAPS capsule Take 50,000 Units by mouth daily.     Historical Provider, MD  warfarin (COUMADIN) 2.5 MG tablet Take 1 to 1.5 tablets by mouth daily as directed by coumadin clinic 02/09/14    Reyne Dumas, MD  zolpidem (AMBIEN) 10 MG tablet Take 5 mg by mouth at bedtime as needed. sleep    Historical Provider, MD   BP 147/69 mmHg  Pulse 94  Temp(Src) 99.4 F (37.4 C) (Oral)  Resp 24  SpO2 97% Physical Exam  Constitutional: She is oriented to person, place, and time. She appears well-developed and well-nourished.  HENT:  Head: Normocephalic and atraumatic.  Eyes: Pupils are equal, round, and reactive to light.  Neck: Normal range of motion. Neck supple. No JVD present.  Cardiovascular: Normal rate, regular rhythm and normal heart sounds.   No murmur heard. Pulmonary/Chest: Effort normal. She has no wheezes. She has rales (Left base). She exhibits no tenderness.  Moderately dyspneic only able to speak 2 words on a single breathe Pacemaker present left anterior chest   Abdominal: Soft. Bowel sounds are normal. She exhibits no distension and no mass. There is no tenderness.  Musculoskeletal: Normal range of motion. She exhibits edema. She exhibits no tenderness.  1-2 + pitting edema  Lymphadenopathy:    She has no cervical adenopathy.  Neurological: She is alert and oriented to person, place, and time. No cranial nerve deficit. She exhibits normal muscle tone. Coordination normal.  Skin: Skin is warm and dry. No rash noted.  Psychiatric: She has a normal mood and affect. Her behavior is normal. Judgment and thought content normal.  Nursing note and vitals reviewed.   ED Course  Procedures   DIAGNOSTIC STUDIES:  Oxygen Saturation is 99% on RA, normal by my interpretation.    COORDINATION OF CARE:  1:14 AM Will order CXR. Discussed treatment plan with pt and children at bedside and they agreed to plan.  Labs Review Results for orders placed or performed during the hospital encounter of 05/24/14  BNP (order ONLY if patient complains of dyspnea/SOB AND you have documented it for THIS visit)  Result Value Ref Range  B Natriuretic Peptide 169.5 (H) 0.0 - 100.0 pg/mL   Basic metabolic panel  Result Value Ref Range   Sodium 134 (L) 135 - 145 mmol/L   Potassium 4.5 3.5 - 5.1 mmol/L   Chloride 99 (L) 101 - 111 mmol/L   CO2 25 22 - 32 mmol/L   Glucose, Bld 204 (H) 70 - 99 mg/dL   BUN 11 6 - 20 mg/dL   Creatinine, Ser 1.01 (H) 0.44 - 1.00 mg/dL   Calcium 9.0 8.9 - 10.3 mg/dL   GFR calc non Af Amer 47 (L) >60 mL/min   GFR calc Af Amer 55 (L) >60 mL/min   Anion gap 10 5 - 15  CBC with Differential  Result Value Ref Range   WBC 17.9 (H) 4.0 - 10.5 K/uL   RBC 4.38 3.87 - 5.11 MIL/uL   Hemoglobin 12.5 12.0 - 15.0 g/dL   HCT 39.8 36.0 - 46.0 %   MCV 90.9 78.0 - 100.0 fL   MCH 28.5 26.0 - 34.0 pg   MCHC 31.4 30.0 - 36.0 g/dL   RDW 13.8 11.5 - 15.5 %   Platelets 266 150 - 400 K/uL   Neutrophils Relative % 91 (H) 43 - 77 %   Neutro Abs 16.4 (H) 1.7 - 7.7 K/uL   Lymphocytes Relative 6 (L) 12 - 46 %   Lymphs Abs 1.0 0.7 - 4.0 K/uL   Monocytes Relative 2 (L) 3 - 12 %   Monocytes Absolute 0.4 0.1 - 1.0 K/uL   Eosinophils Relative 1 0 - 5 %   Eosinophils Absolute 0.1 0.0 - 0.7 K/uL   Basophils Relative 0 0 - 1 %   Basophils Absolute 0.0 0.0 - 0.1 K/uL  I-stat troponin, ED  (not at [facility])  Result Value Ref Range   Troponin i, poc 0.00 0.00 - 0.08 ng/mL   Comment 3           Imaging Review Dg Chest Port 1 View  05/24/2014   CLINICAL DATA:  Dyspnea, onset tonight.  EXAM: PORTABLE CHEST - 1 VIEW  COMPARISON:  02/04/2014  FINDINGS: There is prior sternotomy and CABG. There are intact appearances of the transvenous leads. There are moderate vascular and interstitial congestive changes. No dense airspace consolidation is evident. No large effusion is evident. There is mild unchanged right hemidiaphragm elevation.  IMPRESSION: Mild congestive heart failure   Electronically Signed   By: Andreas Newport M.D.   On: 05/24/2014 02:06   Images viewed by me.   EKG Interpretation   Date/Time:  Sunday May 24 2014 01:13:53 EDT Ventricular Rate:  87 PR Interval:   209 QRS Duration: 186 QT Interval:  467 QTC Calculation: 562 R Axis:   -75 Text Interpretation:  Sinus rhythm Nonspecific IVCD with LAD LVH with  secondary repolarization abnormality When compared with ECG of 02/04/2014,  Sinus rhythm has replaced Ventricular-paced rhythm Confirmed by [facility]  MD,  Shilah Hefel ([id number]) on 05/24/2014 1:19:16 AM      MDM   Final diagnoses:  CHF exacerbation    Dyspnea which may be part of the wrist or tach infection, may be CHF exacerbation. Left-sided rales or worrisome symptoms for pneumonia, but she does have peripheral edema and known heart failure. Review of old records shows she has been followed by cardiology for congestive heart failure. Chest x-ray shows a pattern consistent with CHF with no evidence of focal infiltrate. BNP is mildly to moderately elevated. WBC is elevated with left shift but there  is no definite source of infection. Decision was made to treat her for CHF exacerbation. She was given a dose of furosemide and observed and she seemed to be resting comfortably. She was taken off of nasal oxygen and saturations immediately dropped to 86% so decision was made to admit her. Case is discussed with Dr. Arnoldo Morale of [facility] who agrees to admit the patient.  I personally performed the services described in this documentation, which was scribed in my presence. The recorded information has been reviewed and is accurate.     Delora Fuel, MD 57/32/25 [id number]

## 2014-05-24 NOTE — ED Notes (Signed)
Pt to ED from home c/o shortness of breath starting tonight with absent lung sounds to R upper lobe per EMS. Hx of CHF with bilateral pitting edema. EMS reports initial sats 73% which increased to 100% on 6L NRB. Pt had an episode on vomiting on arrival

## 2014-05-24 NOTE — Progress Notes (Signed)
PROGRESS NOTE    [name] ZDG:[id number] DOB: [birth year] DOA: 05/24/2014 PCP: Kevan Ny, MD  Primary Cardiologist: Dr. Sanda Klein  HPI/Brief narrative 81 y.o. female Chronic systolic CHF, A.Fib on Coumadin Rx, CAD S/P CABG, S/P Pacemaker, DM2 and HTN who presents to the ED with complaints of worsening SOB since 8 pm. She has intermittent Chest pain chronically and is on [name] Rx. She had a recent URI and was treated with outpatient Azithromycin. She complains of DOE, and Orthopnea. She was evaluated in the ED and was found to have hypoxia and was placed on supplemental Oxygen when her O2 sats decreased into the mid 80'S. She was found to have an exacerbation of CHF by Chest X-ray and BNP and then she was administered IV Lasix x 1 dose and referred for admission.   Assessment/Plan:  Acute on chronic systolic CHF/moderate to severe ischemic cardiomyopathy - 2-D echo 02/05/14: LVEF at least moderate-severely depressed with wall motion abnormalities. Grade 1 diastolic dysfunction. - Precipitating factor: Recent URI versus dietary indiscretion (likes to eat salted cheese balls) - Continue IV Lasix, lisinopril, metoprolol and [name]. - Consider cardiology consultation if does not improve.  Acute respiratory failure with hypoxia - Secondary to decompensated CHF - Treat underlying cause and wean off oxygen as tolerated  Paroxysmal atrial fibrillation - Controlled ventricular rate - Continue metoprolol and Coumadin per pharmacy - INR: 2.6  Complete heart block, status post permanent pacemaker  CAD status post CABG/chronic intermittent chest pain - Continue medical management: Aspirin, beta blockers, [name] and statins - Cycle troponin. POC troponin 1: Negative - Chest pain currently resolved. - Chest discomfort may have been related to decompensated CHF.  Hypothyroid - Continue Synthroid  Uncontrolled DM 2 with blindness and vascular disease - Hold oral  hypoglycemics - Continue Lantus and SSI  History of lower extremity DVT - Anticoagulated on Coumadin  Essential hypertension - Soft blood pressures. Monitor and BP meds may need adjustment     Code Status: Full Family Communication: Discussed with patient's daughter at bedside on 5/8. Disposition Plan: DC home when medically stable, possibly in 3-4 days.   Consultants:  None  Procedures:  None  Antibiotics:  None   Subjective: Patient states that she feels better compared to ED. Dyspnea improved. No further chest pains.  Objective: Filed Vitals:   05/24/14 0630 05/24/14 0645 05/24/14 0703 05/24/14 0813  BP: 108/47 97/55  95/46  Pulse: 85 83  82  Temp:   97.8 F (36.6 C) 98.7 F (37.1 C)  TempSrc:   Oral Oral  Resp:  21  22  Height:    5\' 4"  (1.626 m)  Weight:    84.55 kg (186 lb 6.4 oz)  SpO2: 96% 96%  98%   No intake or output data in the 24 hours ending 05/24/14 1622 Filed Weights   05/24/14 0813  Weight: 84.55 kg (186 lb 6.4 oz)     Exam:  General exam: Pleasant elderly female lying comfortably propped up in bed. Respiratory system: Few fine basal crackles otherwise clear to auscultation. No increased work of breathing. Cardiovascular system: S1 & S2 heard, RRR. No JVD, murmurs, gallops, clicks. 1+ pitting bilateral leg edema. Gastrointestinal system: Abdomen is obese/nondistended, soft and nontender. Normal bowel sounds heard. Central nervous system: Alert and oriented. No focal neurological deficits. Hard of hearing. Extremities: Symmetric 5 x 5 power.   Data Reviewed: Basic Metabolic Panel:  Recent Labs Lab 05/24/14 0145  NA 134*  K 4.5  CL  99*  CO2 25  GLUCOSE 204*  BUN 11  CREATININE 1.01*  CALCIUM 9.0   Liver Function Tests: No results for input(s): AST, ALT, ALKPHOS, BILITOT, PROT, ALBUMIN in the last 168 hours. No results for input(s): LIPASE, AMYLASE in the last 168 hours. No results for input(s): AMMONIA in the last 168  hours. CBC:  Recent Labs Lab 05/24/14 0145  WBC 17.9*  NEUTROABS 16.4*  HGB 12.5  HCT 39.8  MCV 90.9  PLT 266   Cardiac Enzymes: No results for input(s): CKTOTAL, CKMB, CKMBINDEX, TROPONINI in the last 168 hours. BNP (last 3 results) No results for input(s): PROBNP in the last 8760 hours. CBG:  Recent Labs Lab 05/24/14 1141  GLUCAP 239*    No results found for this or any previous visit (from the past 240 hour(s)).       Studies: Dg Chest Port 1 View  05/24/2014   CLINICAL DATA:  Dyspnea, onset tonight.  EXAM: PORTABLE CHEST - 1 VIEW  COMPARISON:  02/04/2014  FINDINGS: There is prior sternotomy and CABG. There are intact appearances of the transvenous leads. There are moderate vascular and interstitial congestive changes. No dense airspace consolidation is evident. No large effusion is evident. There is mild unchanged right hemidiaphragm elevation.  IMPRESSION: Mild congestive heart failure   Electronically Signed   By: Andreas Newport M.D.   On: 05/24/2014 02:06        Scheduled Meds: . aspirin  81 mg Oral Daily  . atropine  1 drop Right Eye QID  . donepezil  5 mg Oral QHS  . feeding supplement (GLUCERNA SHAKE)  237 mL Oral BID BM  . folic acid  1 mg Oral Daily  . furosemide  60 mg Intravenous BID  . gabapentin  100 mg Oral BID  . insulin aspart  0-5 Units Subcutaneous QHS  . insulin aspart  0-9 Units Subcutaneous TID WC  . insulin glargine  6 Units Subcutaneous Daily  . levothyroxine  50 mcg Oral QAC breakfast  . lisinopril  2.5 mg Oral Daily  . loratadine  10 mg Oral Daily  . metoprolol tartrate  25 mg Oral BID  . nitroGLYCERIN  0.2 mg Transdermal Daily  . pantoprazole  40 mg Oral Daily  . potassium chloride  10 mEq Oral Daily  . ranolazine  500 mg Oral Daily  . [START ON 05/25/2014] rosuvastatin  10 mg Oral Q M,W,F-1800  . sodium chloride  3 mL Intravenous Q12H  . sodium chloride  3 mL Intravenous Q12H  . sodium chloride  3 mL Intravenous Q12H  .  tobramycin  1 drop Right Eye Daily  . warfarin  2.5 mg Oral Once per day on Sun Wed  . [START ON 05/25/2014] warfarin  3.75 mg Oral Once per day on Mon Tue Thu Fri Sat  . Warfarin - Physician Dosing Inpatient   Does not apply q1800   Continuous Infusions:   Principal Problem:   Acute on chronic diastolic heart failure Active Problems:   Hypertension   Long term current use of anticoagulant therapy   Diabetes mellitus type 2, uncontrolled   Hypothyroidism   A-fib   Abdominal pain, left lower quadrant   CHF exacerbation   SOB (shortness of breath)   UTI (urinary tract infection)    Time spent: 25 mins    Kristan Brummitt, MD, FACP, FHM. [facility] Pager [phone]  If 7PM-7AM, please contact night-coverage [email or web address] Password [email or web address] 05/24/2014, 4:22 PM    LOS: 0 days

## 2014-05-24 NOTE — ED Notes (Signed)
Attempted report for the second time.  Charge nurse states we can not take report right now because our nurses are in the middle of med pass.

## 2014-05-24 NOTE — H&P (Signed)
[facility] Admission History and Physical       [name] XJD:[id number] DOB: 08-15-1920 DOA: 05/24/2014  Referring physician: EDP PCP: August Saucer [name], MD  Specialists: Dr. Rubie Maid ( Cardiology)  Chief Complaint: SOB  HPI: [name] is a 81 y.o. female Chronic Diastolic CHF, A.Fib on Coumadin Rx, CAD S/P CABG, S/P Pacemaker, DM2 and HTN who presents to the ED with complaints of worsening SOB since 8 pm.   She has intermittent Chest pain chronically and is on [name] Rx.  She had a recent URI and was treated with outpatient Azithromycin.  She complains of DOE, and Orthopnea.   She was evaluated in the ED and was found to have hypoxia and was placed on supplemental Oxygen when her O2 sats decreased into the mid 80'S.   She was found to have an exacerbation of CHF by Chest X-ray and BNP and then she was administered IV Lasix x 1 dose and referred for admission.      Review of Systems:  Constitutional: No Weight Loss, No Weight Gain, Night Sweats, Fevers, Chills, Dizziness, Light Headedness, Fatigue, +Generalized Weakness HEENT: No Headaches, Difficulty Swallowing,Tooth/Dental Problems,Sore Throat,  No Sneezing, Rhinitis, Ear Ache, Nasal Congestion, or Post Nasal Drip,  Cardio-vascular:   +Chest pain, +Orthopnea, PND, Edema in Lower Extremities, Anasarca, Dizziness, Palpitations  Resp:   +Dyspnea, +DOE, No Productive Cough, No Non-Productive Cough, No Hemoptysis, No Wheezing.    GI: No Heartburn, Indigestion, Abdominal Pain, Nausea, Vomiting, Diarrhea, Constipation, Hematemesis, Hematochezia, Melena, Change in Bowel Habits,  Loss of Appetite  GU: No Dysuria, No Change in Color of Urine, No Urgency or Urinary Frequency, No Flank pain.  Musculoskeletal: No Joint Pain or Swelling, No Decreased Range of Motion, No Back Pain.  Neurologic: No Syncope, No Seizures, Muscle Weakness, Paresthesia, Vision Disturbance or Loss, No Diplopia, No Vertigo, No Difficulty Walking,  Skin:  No Rash or Lesions. Psych: No Change in Mood or Affect, No Depression or Anxiety, No Memory loss, No Confusion, or Hallucinations   Past Medical History  Diagnosis Date  . Hypertension   . Coronary artery disease   . Legally blind   . Chronic kidney disease   . Anemia   . Hypothyroid   . Gastric ulcer   . Hiatal hernia   . Dyspepsia   . UTI (lower urinary tract infection)   . H/O: GI bleed 12//13  . Diastolic dysfunction   . Pacemaker   . High cholesterol   . CHF (congestive heart failure)   . Anginal pain   . MI (myocardial infarction) ? 1994  . DVT of upper extremity (deep vein thrombosis) 06/13/2012    BUE  . Seasonal allergies   . Allergy to perfume     "strong perfumes only" (06/13/2012)  . Shortness of breath     "can happen at any time" (06/13/2012)  . Type II diabetes mellitus   . History of blood transfusion 2013    "not related to OR; BP had dropped" (06/13/2012)  . GERD (gastroesophageal reflux disease)   . Daily headache   . Arthritis     "all over" (06/13/2012)  . A-fib   . 2Nd degree atrioventricular block   . Abnormal nuclear stress test, 08/2013 09/17/2013     Past Surgical History  Procedure Laterality Date  . Esophagogastroduodenoscopy  12/22/2011    Procedure: ESOPHAGOGASTRODUODENOSCOPY (EGD);  Surgeon: Iva Boop, MD;  Location: [facility];  Service: Endoscopy;  Laterality: N/A;  . Cardioversion  06/06/06  successful  . Insert / replace / remove pacemaker  06/29/2006    Medtronic adapta  . Cardiac catheterization  10/25/92  . Cardiac catheterization  11/18/03    w/grafts 100%CX LAD 80 & 100%  . Cardiac catheterization  01/24/05    diffuse disease of native vessels  . Cardiac catheterization  06/06/06    severe native CAD  . Coronary artery bypass graft  10/27/92    LIMA to LAD,SVG to LAD second diagonal,obtuse maraginal of the CX and posterior descendingbranch of the RCA  . Cataract extraction w/ intraocular lens  implant, bilateral Bilateral     . Refractive surgery Bilateral       Prior to Admission medications   Medication Sig Start Date End Date Taking? Authorizing Provider  aspirin 81 MG chewable tablet Chew 81 mg by mouth daily.   Yes Historical Provider, MD  atropine 1 % ophthalmic solution Place 1 drop into the right eye 4 (four) times daily.  10/29/13  Yes Historical Provider, MD  azithromycin (ZITHROMAX) 250 MG tablet Take 250 mg by mouth daily. Take for 5 days starting 05/21/14 05/21/14 05/25/14 Yes Historical Provider, MD  donepezil (ARICEPT) 5 MG tablet Take 5 mg by mouth at bedtime.   Yes Historical Provider, MD  feeding supplement, GLUCERNA SHAKE, (GLUCERNA SHAKE) LIQD Take 237 mLs by mouth 2 (two) times daily between meals.   Yes Historical Provider, MD  fluticasone (FLONASE) 50 MCG/ACT nasal spray Place 2 sprays into both nostrils 2 (two) times daily.   Yes Historical Provider, MD  folic acid (FOLVITE) 1 MG tablet Take 1 tablet (1 mg total) by mouth daily. 01/19/14  Yes Mihai Croitoru, MD  furosemide (LASIX) 40 MG tablet Take 0.5 tablets (20 mg total) by mouth daily. 02/07/14  Yes Reyne Dumas, MD  gabapentin (NEURONTIN) 100 MG capsule Take 100 mg by mouth 2 (two) times daily.    Yes Historical Provider, MD  glipiZIDE (GLUCOTROL) 5 MG tablet Take one-half tablet by  mouth daily only if blood  sugar is over 200 Patient taking differently: Take 2.5 mg by mouth at bedtime.  05/18/14  Yes Elayne Snare, MD  guaiFENesin (MUCINEX) 600 MG 12 hr tablet Take 600 mg by mouth 2 (two) times daily as needed for cough or to loosen phlegm.    Yes Historical Provider, MD  Hyoscyamine Sulfate 0.375 MG TBCR A1 tab twice a day as needed, abdominal pain 06/30/13  Yes Inda Castle, MD  Insulin Glargine (LANTUS SOLOSTAR) 100 UNIT/ML Solostar Pen Inject 10 Units into the skin daily at 10 pm. Patient taking differently: Inject 6 Units into the skin every morning.  04/16/14  Yes Elayne Snare, MD  Lancets (ACCU-CHEK MULTICLIX) lancets Use as instructed to  check blood sugar 2 times per day dx code 250.00 07/30/13  Yes Elayne Snare, MD  levothyroxine (SYNTHROID, LEVOTHROID) 50 MCG tablet Take 1 tablet (50 mcg total) by mouth daily before breakfast. 01/05/14  Yes Elayne Snare, MD  loratadine (CLARITIN) 10 MG tablet Take 10 mg by mouth daily.   Yes Historical Provider, MD  meclizine (ANTIVERT) 25 MG tablet Take 25 mg by mouth 3 (three) times daily as needed for dizziness.   Yes Historical Provider, MD  metoprolol tartrate (LOPRESSOR) 25 MG tablet Take 1 tablet (25 mg total) by mouth 2 (two) times daily. Patient taking differently: Take 25 mg by mouth as needed.  02/07/14  Yes Reyne Dumas, MD  nitrofurantoin, macrocrystal-monohydrate, (MACROBID) 100 MG capsule Take 100 mg by mouth 2 (  two) times daily.   Yes Historical Provider, MD  nitroGLYCERIN (NITRODUR - DOSED IN MG/24 HR) 0.2 mg/hr patch Place 1 patch (0.2 mg total) onto the skin daily. 02/12/14  Yes Luke K Kilroy, PA-C  omeprazole (PRILOSEC) 20 MG capsule Take 1 capsule (20 mg total) by mouth daily. 06/04/13  Yes Mihai Croitoru, MD  Polyvinyl Alcohol-Povidone (REFRESH OP) Place 1 drop into both eyes daily.    Yes Historical Provider, MD  potassium chloride SA (K-DUR,KLOR-CON) 20 MEQ tablet Take 20 mEq by mouth every other day.   Yes Historical Provider, MD  ranolazine ([name]) 500 MG 12 hr tablet Take 1 tablet (500 mg total) by mouth daily. 05/14/14  Yes Mihai Croitoru, MD  rosuvastatin (CRESTOR) 10 MG tablet Take 1 tablet (10 mg total) by mouth 3 (three) times a week. [id number] Exp: 8-18 02/18/14  Yes Luke K Kilroy, PA-C  senna-docusate (SENOKOT-S) 8.6-50 MG per tablet Take 1 tablet by mouth 2 (two) times daily. Patient taking differently: Take 1 tablet by mouth as needed.  02/07/14  Yes Reyne Dumas, MD  tobramycin (TOBREX) 0.3 % ophthalmic solution Place 1 drop into the right eye daily as needed.  10/18/12  Yes Historical Provider, MD  traMADol (ULTRAM) 50 MG tablet Take 1 tablet (50 mg total) by mouth every  6 (six) hours as needed for moderate pain. 05/14/14  Yes Mihai Croitoru, MD  Vitamin D, Ergocalciferol, (DRISDOL) 50000 UNITS CAPS capsule Take 50,000 Units by mouth daily.    Yes Historical Provider, MD  warfarin (COUMADIN) 2.5 MG tablet Take 1 to 1.5 tablets by mouth daily as directed by coumadin clinic Patient taking differently: Take 2.5-3.75 mg by mouth See admin instructions. Take 1 tablet on Sunday and Wednesday. Take 1.5 tablets all other days. 02/09/14  Yes Reyne Dumas, MD  zolpidem (AMBIEN) 10 MG tablet Take 5 mg by mouth at bedtime as needed. sleep   Yes Historical Provider, MD  ACCU-CHEK AVIVA PLUS test strip Use to check blood sugars 2 times daily 01/19/14   Elayne Snare, MD  Blood Glucose Monitoring Suppl (ACCU-CHEK AVIVA PLUS) W/DEVICE KIT Use to check blood sugar 2 times per day dx code 250.00 07/30/13   Elayne Snare, MD  hydrocortisone (ANUSOL-HC) 25 MG suppository Use 1 suppository rectally at bedtime for 7 days. Patient not taking: Reported on 05/24/2014 04/01/13   Amy S Esterwood, PA-C  Lancets Misc. (ACCU-CHEK FASTCLIX LANCET) KIT Use as directec 03/17/13   Elayne Snare, MD     Allergies  Allergen Reactions  . Darvon     Unknown reaction   . Digoxin And Related Nausea Only  . Penicillins Other (See Comments)    Was told had allergy from childhood... Unknown reaction  . Percocet [Oxycodone-Acetaminophen] Other (See Comments)    confused  . Percodan [Oxycodone-Aspirin]   . Vicodin [Hydrocodone-Acetaminophen] Other (See Comments)    confused    Social History:  reports that she has never smoked. She has never used smokeless tobacco. She reports that she does not drink alcohol or use illicit drugs.    Family History  Problem Relation Age of Onset  . Breast cancer Daughter   . Diabetes Son   . Diabetes Daughter   . Heart disease Father   . Diabetes Father   . Colon polyps Daughter   . Heart attack Brother   . Diabetes Brother   . Stroke Sister        Physical Exam:  GEN:   Pleasant Elderly Obese  81 y.o.  African American female examined and in no acute distress; cooperative with exam Filed Vitals:   05/24/14 0630 05/24/14 0645 05/24/14 0703 05/24/14 0813  BP: 108/47 97/55  95/46  Pulse: 85 83  82  Temp:   97.8 F (36.6 C) 98.7 F (37.1 C)  TempSrc:   Oral Oral  Resp:  21  22  Height:    '5\' 4"'$  (1.626 m)  Weight:    84.55 kg (186 lb 6.4 oz)  SpO2: 96% 96%  98%   Blood pressure 95/46, pulse 82, temperature 98.7 F (37.1 C), temperature source Oral, resp. rate 22, height $RemoveBe'5\' 4"'LiRKanGEL$  (1.626 m), weight 84.55 kg (186 lb 6.4 oz), SpO2 98 %. PSYCH: She is alert and oriented x4; does not appear anxious does not appear depressed; affect is normal HEENT: Normocephalic and Atraumatic, Mucous membranes pink; PERRLA; EOM intact; Fundi:  Benign;  No scleral icterus, Nares: Patent, Oropharynx: Clear,    Neck:  FROM, No Cervical Lymphadenopathy nor Thyromegaly or Carotid Bruit; No JVD; Breasts:: Not examined CHEST WALL: No tenderness CHEST: DEcreased Breath Sounds, with Bibasilar Rales, No Rhonchi HEART: Regular rate and rhythm; no murmurs rubs or gallops BACK: No kyphosis or scoliosis; No CVA tenderness ABDOMEN: Positive Bowel Sounds, Obese, Soft Non-Tender, No Rebound or Guarding; No Masses, No Organomegaly Rectal Exam: Not done EXTREMITIES: No Cyanosis, Clubbing, 2+ BLE Edema; No Ulcerations. Genitalia: not examined PULSES: 2+ and symmetric SKIN: Normal hydration no rash or ulceration CNS:  Alert and Oriented x 4, No Focal Deficits except for Vision,  +Genrealized Weakness Vascular: pulses palpable throughout    Labs on Admission:  Basic Metabolic Panel:  Recent Labs Lab 05/24/14 0145  NA 134*  K 4.5  CL 99*  CO2 25  GLUCOSE 204*  BUN 11  CREATININE 1.01*  CALCIUM 9.0   Liver Function Tests: No results for input(s): AST, ALT, ALKPHOS, BILITOT, PROT, ALBUMIN in the last 168 hours. No results for input(s): LIPASE, AMYLASE in the last 168 hours. No results  for input(s): AMMONIA in the last 168 hours. CBC:  Recent Labs Lab 05/24/14 0145  WBC 17.9*  NEUTROABS 16.4*  HGB 12.5  HCT 39.8  MCV 90.9  PLT 266   Cardiac Enzymes: No results for input(s): CKTOTAL, CKMB, CKMBINDEX, TROPONINI in the last 168 hours.  BNP (last 3 results)  Recent Labs  05/24/14 0145  BNP 169.5*    ProBNP (last 3 results) No results for input(s): PROBNP in the last 8760 hours.  CBG: No results for input(s): GLUCAP in the last 168 hours.  Radiological Exams on Admission: Dg Chest Port 1 View  05/24/2014   CLINICAL DATA:  Dyspnea, onset tonight.  EXAM: PORTABLE CHEST - 1 VIEW  COMPARISON:  02/04/2014  FINDINGS: There is prior sternotomy and CABG. There are intact appearances of the transvenous leads. There are moderate vascular and interstitial congestive changes. No dense airspace consolidation is evident. No large effusion is evident. There is mild unchanged right hemidiaphragm elevation.  IMPRESSION: Mild congestive heart failure   Electronically Signed   By: Andreas Newport M.D.   On: 05/24/2014 02:06     EKG: Independently reviewed. Normal Sinus Rhythm rate =87,   +LVH changes and No Acute S-T changes   Assessment/Plan:   81 y.o. female with  Principal Problem:   1.   Acute on chronic diastolic heart failure/CHF exacerbation   Follow CHF Protocol    Diurese with IV Lasix    Monitor I/Os and Electrolytes    Telemetry  Monitoring    O2 PRN    Montior O2 sats   Active Problems:   2.   SOB (shortness of breath)- due to #1   NCO2 PRN Titrate PRN   Monitor O2 Sats      3.   Abdominal pain, left lower quadrant- due to UTI, or GI Problem   Continue ABx for UTI   Has Outpt referral to Dr Deatra Ina for evaluation.        4.  UTI (urinary tract infection)   Recent Dx, placed on Macrobid Rx   Send Urine Culture   Change to IV Rocephin     5.   A-fib   Telemetry Monitoring   Continue Metoprolol and Coumadin Rx       6.    Hypertension   Continue Metoprolol, Lasix Rx    7.  CAD-    Continue Metoprolol, [name] and Nitrodur Patch   Telemetry Monitoring     7.   Diabetes mellitus type 2, uncontrolled   Continue Lantus Insulin 6 units SQ q day as Glucose Tolerates   Hold Glipizide   Monitor Glucose Levels   SSI Coverage PRN     8.   Hypothyroidism   Continue Levothyroxine Rx   Check TSH Level    9.    Long term current use of anticoagulant therapy   Continue Coumadin Rx Adjust PRN   Monitor Pt/INR daily.             Code Status:     FULL CODE        Family Communication:   Family at Bedside   Disposition Plan:    Inpatient  Status        Time spent:  [facility] Pager [phone]   If [place] Please Contact the Day Rounding Team MD for [facility]  If 7PM-7AM, Please Contact Night-Floor Coverage  [email or web address] Password [email or web address] 05/24/2014, 9:10 AM     ADDENDUM:   Patient was seen and examined on 05/24/2014

## 2014-05-25 DIAGNOSIS — N179 Acute kidney failure, unspecified: Secondary | ICD-10-CM

## 2014-05-25 DIAGNOSIS — J9601 Acute respiratory failure with hypoxia: Secondary | ICD-10-CM | POA: Diagnosis not present

## 2014-05-25 DIAGNOSIS — I5043 Acute on chronic combined systolic (congestive) and diastolic (congestive) heart failure: Principal | ICD-10-CM

## 2014-05-25 DIAGNOSIS — N183 Chronic kidney disease, stage 3 (moderate): Secondary | ICD-10-CM

## 2014-05-25 DIAGNOSIS — E1165 Type 2 diabetes mellitus with hyperglycemia: Secondary | ICD-10-CM | POA: Diagnosis not present

## 2014-05-25 DIAGNOSIS — I480 Paroxysmal atrial fibrillation: Secondary | ICD-10-CM | POA: Diagnosis not present

## 2014-05-25 DIAGNOSIS — D649 Anemia, unspecified: Secondary | ICD-10-CM | POA: Diagnosis not present

## 2014-05-25 DIAGNOSIS — I10 Essential (primary) hypertension: Secondary | ICD-10-CM | POA: Diagnosis not present

## 2014-05-25 DIAGNOSIS — I5033 Acute on chronic diastolic (congestive) heart failure: Secondary | ICD-10-CM | POA: Diagnosis not present

## 2014-05-25 LAB — CBC
HEMATOCRIT: 32.4 % — AB (ref 36.0–46.0)
HEMOGLOBIN: 10.3 g/dL — AB (ref 12.0–15.0)
MCH: 28.3 pg (ref 26.0–34.0)
MCHC: 31.8 g/dL (ref 30.0–36.0)
MCV: 89 fL (ref 78.0–100.0)
Platelets: 230 10*3/uL (ref 150–400)
RBC: 3.64 MIL/uL — ABNORMAL LOW (ref 3.87–5.11)
RDW: 14.1 % (ref 11.5–15.5)
WBC: 12.8 10*3/uL — ABNORMAL HIGH (ref 4.0–10.5)

## 2014-05-25 LAB — TROPONIN I
TROPONIN I: 0.03 ng/mL (ref <0.031)
Troponin I: 0.11 ng/mL — ABNORMAL HIGH (ref <0.031)

## 2014-05-25 LAB — BASIC METABOLIC PANEL
Anion gap: 8 (ref 5–15)
BUN: 21 mg/dL — ABNORMAL HIGH (ref 6–20)
CALCIUM: 8.7 mg/dL — AB (ref 8.9–10.3)
CO2: 27 mmol/L (ref 22–32)
CREATININE: 1.64 mg/dL — AB (ref 0.44–1.00)
Chloride: 98 mmol/L — ABNORMAL LOW (ref 101–111)
GFR calc Af Amer: 30 mL/min — ABNORMAL LOW (ref >60)
GFR calc non Af Amer: 26 mL/min — ABNORMAL LOW (ref >60)
GLUCOSE: 108 mg/dL — AB (ref 70–99)
Potassium: 4.5 mmol/L (ref 3.5–5.1)
Sodium: 133 mmol/L — ABNORMAL LOW (ref 135–145)

## 2014-05-25 LAB — PROTIME-INR
INR: 2.86 — AB (ref 0.00–1.49)
Prothrombin Time: 30.3 seconds — ABNORMAL HIGH (ref 11.6–15.2)

## 2014-05-25 LAB — TSH: TSH: 4.519 u[IU]/mL — AB (ref 0.350–4.500)

## 2014-05-25 LAB — GLUCOSE, CAPILLARY
Glucose-Capillary: 105 mg/dL — ABNORMAL HIGH (ref 70–99)
Glucose-Capillary: 129 mg/dL — ABNORMAL HIGH (ref 70–99)
Glucose-Capillary: 129 mg/dL — ABNORMAL HIGH (ref 70–99)
Glucose-Capillary: 133 mg/dL — ABNORMAL HIGH (ref 70–99)

## 2014-05-25 MED ORDER — FUROSEMIDE 10 MG/ML IJ SOLN: 60.0000 mg | Freq: Two times a day (BID) | INTRAMUSCULAR | Status: DC

## 2014-05-25 MED ORDER — FUROSEMIDE 40 MG PO TABS
40.0000 mg | ORAL_TABLET | Freq: Every day | ORAL | Status: DC
  Administered 2014-05-25 – 2014-05-29 (×5): 40 mg via ORAL
  Filled 2014-05-25 (×5): qty 1

## 2014-05-25 MED ORDER — WARFARIN SODIUM 2.5 MG PO TABS
2.5000 mg | ORAL_TABLET | Freq: Once | ORAL | Status: AC
  Administered 2014-05-25: 2.5 mg via ORAL
  Filled 2014-05-25: qty 1

## 2014-05-25 MED ORDER — LISINOPRIL 2.5 MG PO TABS: 2.5000 mg | ORAL_TABLET | Freq: Every day | ORAL | Status: DC

## 2014-05-25 NOTE — Progress Notes (Signed)
[facility] for warfarin Indication: atrial fibrillation  Allergies  Allergen Reactions  . Darvon     Unknown reaction   . Digoxin And Related Nausea Only  . Penicillins Other (See Comments)    Was told had allergy from childhood... Unknown reaction  . Percocet [Oxycodone-Acetaminophen] Other (See Comments)    confused  . Percodan [Oxycodone-Aspirin]   . Vicodin [Hydrocodone-Acetaminophen] Other (See Comments)    confused    Patient Measurements: Height: 5\' 4"  (162.6 cm) Weight: 184 lb 15.9 oz (83.912 kg) IBW/kg (Calculated) : 54.7  Vital Signs: Temp: 97.6 F (36.4 C) (05/09 0613) Temp Source: Oral (05/09 0613) BP: 124/68 mmHg (05/09 0613) Pulse Rate: 70 (05/09 0613)  Labs:  Recent Labs  05/24/14 0145 05/24/14 1000 05/24/14 1714 05/24/14 2249 05/25/14 0525  HGB 12.5  --   --   --  10.3*  HCT 39.8  --   --   --  32.4*  PLT 266  --   --   --  230  LABPROT  --  28.8*  --   --  30.3*  INR  --  2.68*  --   --  2.86*  CREATININE 1.01*  --   --   --  1.64*  TROPONINI  --   --  0.07* 0.03 0.11*    Estimated Creatinine Clearance: 23.4 mL/min (by C-G formula based on Cr of 1.64).  Assessment: 81 y.o. female Chronic systolic CHF, A.Fib on Coumadin Rx, CAD S/P CABG, S/P Pacemaker, DM2 and HTN who presents to the ED with complaints of worsening SOB since 8 pm.  INR on admission is 2.68 (at goal) no bleeding issues on arrival. CBC within normal limits. INR trended up today to 2.86  Warfarin dose pta: 3.75mg  daily except 2.5mg  on Sunday and Wednesday.  Goal of Therapy:  INR 2-3 Monitor platelets by anticoagulation protocol: Yes   Plan:  Coumadin 2.5 mg po x 1 dose tonight Daily INR for now x3  Thank you. Anette Guarneri, PharmD [phone]  05/25/2014 10:46 AM

## 2014-05-25 NOTE — Progress Notes (Signed)
PROGRESS NOTE    [name] EXB:[id number] DOB: 26-Mar-1920 DOA: 05/24/2014 PCP: Kevan Ny, MD  Primary Cardiologist: Dr. Sanda Klein  HPI/Brief narrative 81 y.o. female Chronic systolic CHF, A.Fib on Coumadin Rx, CAD S/P CABG, S/P Pacemaker, DM2 and HTN who presents to the ED with complaints of worsening SOB since 8 pm. She has intermittent Chest pain chronically and is on [name] Rx. She had a recent URI and was treated with outpatient Azithromycin. She complains of DOE, and Orthopnea. She was evaluated in the ED and was found to have hypoxia and was placed on supplemental Oxygen when her O2 sats decreased into the mid 80'S. She was found to have an exacerbation of CHF by Chest X-ray and BNP and then she was administered IV Lasix x 1 dose and referred for admission.   Assessment/Plan:  Acute on chronic systolic and diastolic CHF/moderate to severe ischemic cardiomyopathy - 2-D echo 02/05/14: LVEF at least moderate-severely depressed with wall motion abnormalities. Grade 1 diastolic dysfunction. - Precipitating factor: Recent URI versus dietary indiscretion (likes to eat salted cheese balls) - Patient was started on IV Lasix on admission. Weight down 2 pounds but intake output not adequately charted. - Creatinine has bumped up from normal to 1.6. Cardiology input appreciated and have changed Lasix to 40 mg orally daily. Holding lisinopril today. - Continue metolazone and [name] (on for chronic angina).  Acute respiratory failure with hypoxia - Secondary to decompensated CHF - Treat underlying cause and wean off oxygen as tolerated  Paroxysmal atrial fibrillation - Controlled ventricular rate - Continue metoprolol and Coumadin per pharmacy - Anticoagulated  Complete heart block, status post permanent pacemaker  CAD status post CABG/chronic intermittent chest pain - Continue medical management: Aspirin, beta blockers, [name] and statins - Mildly elevated troponin  likely demand ischemia from decompensated CHF and AKI. Flat trend. 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  History of lower extremity DVT - Anticoagulated on Coumadin  Essential hypertension - Controlled  Acute on stage III chronic kidney disease - Creatinine jumped from 1.01 on admission to 1.64 the next day. Likely secondary to IV Lasix. - Lasix changed to by mouth. Holding ACEI today. - Follow BMP in a.m.  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 None at bedside today. Disposition Plan: DC home when medically stable, possibly in 2 days.   Consultants:  Cardiology  Procedures:  None  Antibiotics:  None   Subjective: Intermittent mild chest discomfort. Dyspnea better. Denies any other complaints.   Objective: Filed Vitals:   05/24/14 2203 05/24/14 2332 05/25/14 0613 05/25/14 1100  BP: 121/62  124/68 134/59  Pulse: 74  70 76  Temp: 97.8 F (36.6 C)  97.6 F (36.4 C)   TempSrc: Oral  Oral Oral  Resp: 18  18 16   Height:      Weight:   83.912 kg (184 lb 15.9 oz)   SpO2: 100% 94% 96% 95%    Intake/Output Summary (Last 24 hours) at 05/25/14 1333 Last data filed at 05/25/14 1058  Gross per 24 hour  Intake    733 ml  Output      0 ml  Net  733 ml   Filed Weights   05/24/14 0813 05/25/14 0613  Weight: 84.55 kg (186 lb 6.4 oz) 83.912 kg (184 lb 15.9 oz)     Exam:  General exam: Pleasant elderly female lying comfortably propped up in bed. Respiratory system: Few fine basal crackles otherwise clear to auscultation. No increased work of breathing. Cardiovascular system: S1 & S2 heard, RRR. No JVD, murmurs, gallops,  clicks. 1+ pitting bilateral leg edema-slightly better . Telemetry: V paced/SR.  Gastrointestinal system: Abdomen is obese/nondistended, soft and nontender. Normal bowel sounds heard. Central nervous system: Alert and oriented. No focal neurological deficits. Hard of hearing. Extremities: Symmetric 5 x 5 power.   Data Reviewed: Basic Metabolic Panel:  Recent Labs Lab 05/24/14 0145 05/25/14 0525  NA 134* 133*  K 4.5 4.5  CL 99* 98*  CO2 25 27  GLUCOSE 204* 108*  BUN 11 21*  CREATININE 1.01* 1.64*  CALCIUM 9.0 8.7*   Liver Function Tests: No results for input(s): AST, ALT, ALKPHOS, BILITOT, PROT, ALBUMIN in the last 168 hours. No results for input(s): LIPASE, AMYLASE in the last 168 hours. No results for input(s): AMMONIA in the last 168 hours. CBC:  Recent Labs Lab 05/24/14 0145 05/25/14 0525  WBC 17.9* 12.8*  NEUTROABS 16.4*  --   HGB 12.5 10.3*  HCT 39.8 32.4*  MCV 90.9 89.0  PLT 266 230   Cardiac Enzymes:  Recent Labs Lab 05/24/14 1714 05/24/14 2249 05/25/14 0525  TROPONINI 0.07* 0.03 0.11*   BNP (last 3 results) No results for input(s): PROBNP in the last 8760 hours. CBG:  Recent Labs Lab 05/24/14 1141 05/24/14 1647 05/24/14 2209 05/25/14 0615 05/25/14 1103  GLUCAP 239* 156* 129* 105* 129*    No results found for this or any previous visit (from the past 240 hour(s)).       Studies: Dg Chest Port 1 View  05/24/2014   CLINICAL DATA:  Dyspnea, onset tonight.  EXAM: PORTABLE CHEST - 1 VIEW  COMPARISON:  02/04/2014  FINDINGS: There is prior sternotomy and CABG. There are intact appearances of the transvenous leads. There are moderate vascular and interstitial congestive changes. No dense airspace consolidation is evident. No large effusion is evident. There is mild unchanged right hemidiaphragm elevation.  IMPRESSION: Mild congestive heart failure   Electronically Signed   By: Andreas Newport M.D.   On: 05/24/2014 02:06        Scheduled  Meds: . aspirin  81 mg Oral Daily  . atropine  1 drop Right Eye QID  . donepezil  5 mg Oral QHS  . feeding supplement (GLUCERNA SHAKE)  237 mL Oral BID BM  . folic acid  1 mg Oral Daily  . furosemide  60 mg Intravenous BID  . gabapentin  100 mg Oral BID  . insulin aspart  0-5 Units Subcutaneous QHS  . insulin aspart  0-9 Units Subcutaneous TID WC  . insulin glargine  6 Units Subcutaneous Daily  . levothyroxine  50 mcg Oral QAC breakfast  . [START ON 05/26/2014] lisinopril  2.5 mg Oral Daily  . loratadine  10 mg Oral Daily  . metoprolol tartrate  25 mg Oral BID  . pantoprazole  40 mg Oral Daily  . potassium chloride  10 mEq Oral Daily  . ranolazine  500 mg Oral Daily  . rosuvastatin  10 mg Oral Q M,W,F-1800  . sodium chloride  3 mL Intravenous Q12H  . tobramycin  1 drop Right Eye Daily  . warfarin  2.5  mg Oral ONCE-1800  . Warfarin - Pharmacist Dosing Inpatient   Does not apply q1800   Continuous Infusions:   Principal Problem:   Acute on chronic diastolic heart failure Active Problems:   Hypertension   Long term current use of anticoagulant therapy   Diabetes mellitus type 2, uncontrolled   Hypothyroidism   A-fib   Abdominal pain, left lower quadrant   CHF exacerbation   SOB (shortness of breath)   UTI (urinary tract infection)    Time spent: 14 mins    Ashvin Adelson, MD, FACP, FHM. [facility] Pager [phone]  If 7PM-7AM, please contact night-coverage [email or web address] Password [email or web address] 05/25/2014, 1:33 PM    LOS: 1 day

## 2014-05-25 NOTE — Care Management Note (Signed)
Case Management Note  Patient Details  Name: LATASHA BUCZKOWSKI MRN: [id number] Date of Birth: August 24, 1920  Subjective/Objective:     Pt adm on 05/25/14 with CP and CHF exacerbation.  PTA, pt resides at home with family.                 Action/Plan: Will follow for discharge needs as pt progresses.     Expected Discharge Date:                  Expected Discharge Plan:  [place]  In-House Referral:     Discharge planning Services  CM Consult  Post Acute Care Choice:    Choice offered to:     DME Arranged:    DME Agency:     HH Arranged:    [place] Agency:     Status of Service:  In process, will continue to follow  Medicare Important Message Given:    Date Medicare IM Given:    Medicare IM give by:    Date Additional Medicare IM Given:    Additional Medicare Important Message give by:     If discussed at [place] of Stay Meetings, dates discussed:    Additional Comments:  Ella Bodo, RN 05/25/2014, 3:41 PM Phone [phone]

## 2014-05-25 NOTE — Consult Note (Addendum)
Admit date: 05/24/2014 Referring Physician  Dr. Algis Liming Primary Cardiologist  Dr. Sallyanne Kuster Reason for Consultation  CHF  HPI: This is a 81 y.o. Widowed, female (her husband had been one of our pts as well) who is nonambulatory (uses a wheelchair) and has a history of coronary disease, previous bypass surgery, permanent pacemaker for high-grade AV block, paroxysmal atrial fibrillation, recent upper extremity deep venous thrombosis associated with a venous catheter and recurrent problems with severe anemia due to gastrointestinal bleeding. She is on coumadin. She is followed by Dr. Sallyanne Kuster. She was seen back in the fall for follow up to discuss abnormal stress test and decide upon cardiac cath.   She had a nuc study for an episode of chest pain night last fall. The pain was actually in her left arm and  axillary area. Also arm burning in her wrist and into her left arm.  She had no diaphoresis no shortness of breath and no nausea. She is unable to take sublingual nitroglycerin as it makes her pass out.   Nuc study was noted to be: High risk stress nuclear study with large,  severe intensity, partially reversible, inferior and apical  defect consistent with prior inferior apical infarct and mild  peri-infarct ischemia towards the apex; perfusion similar to  previous study; study high risk due to reduced LV function.  LV Wall Motion: Inferior and apical akinesis; LV function worse  compared to previous.  She also has poor memory and she does not always remember if she has pain. Her son who is POA and her daughter prior to [place] 09/17/2013,  had discussed cardiac catheterization and all 3 prefer not to have cardiac catheterization but to try medical therapy. She has CKD 3 bordering on 4.   She presented to [facility] on 5/8 with DOE and orthopnea.  She had a recent URI and was treated with outpt Azithromycin.  She was found to be hypoxic in the ER and was started on O2 in them id 80's.  Her BNP was  mildly elevated at 169.  Troponin was mildly elevated at 0.11 with a fairly flat trend.  Chest xray showed mild CHF.  She was started on IV lasix.  Her I&O's are incomplete today but weight appears to be down 2 lbs.  Cardiology is now asked on consult for further guidance in diuretic management.      PMH:   Past Medical History  Diagnosis Date  . Hypertension   . Coronary artery disease   . Legally blind   . Chronic kidney disease   . Anemia   . Hypothyroid   . Gastric ulcer   . Hiatal hernia   . Dyspepsia   . UTI (lower urinary tract infection)   . H/O: GI bleed 12//13  . Diastolic dysfunction   . Pacemaker   . High cholesterol   . CHF (congestive heart failure)   . Anginal pain   . MI (myocardial infarction) ? 1994  . DVT of upper extremity (deep vein thrombosis) 06/13/2012    BUE  . Seasonal allergies   . Allergy to perfume     "strong perfumes only" (06/13/2012)  . Shortness of breath     "can happen at any time" (06/13/2012)  . Type II diabetes mellitus   . History of blood transfusion 2013    "not related to OR; BP had dropped" (06/13/2012)  . GERD (gastroesophageal reflux disease)   . Daily headache   . Arthritis     "  all over" (06/13/2012)  . A-fib   . 2Nd degree atrioventricular block   . Abnormal nuclear stress test, 08/2013 09/17/2013     PSH:   Past Surgical History  Procedure Laterality Date  . Esophagogastroduodenoscopy  12/22/2011    Procedure: ESOPHAGOGASTRODUODENOSCOPY (EGD);  Surgeon: Gatha Mayer, MD;  Location: [facility];  Service: Endoscopy;  Laterality: N/A;  . Cardioversion  06/06/06    successful  . Insert / replace / remove pacemaker  06/29/2006    Medtronic adapta  . Cardiac catheterization  10/25/92  . Cardiac catheterization  11/18/03    w/grafts 100%CX LAD 80 & 100%  . Cardiac catheterization  01/24/05    diffuse disease of native vessels  . Cardiac catheterization  06/06/06    severe native CAD  . Coronary artery bypass graft  10/27/92      LIMA to LAD,SVG to LAD second diagonal,obtuse maraginal of the CX and posterior descendingbranch of the RCA  . Cataract extraction w/ intraocular lens  implant, bilateral Bilateral   . Refractive surgery Bilateral     Allergies:  [place]; Digoxin and related; Penicillins; Percocet; Percodan; and Vicodin Prior to Admit Meds:   Prescriptions prior to admission  Medication Sig Dispense Refill Last Dose  . aspirin 81 MG chewable tablet Chew 81 mg by mouth daily.   05/23/2014 at Unknown time  . atropine 1 % ophthalmic solution Place 1 drop into the right eye 4 (four) times daily.   0 05/23/2014 at Unknown time  . azithromycin (ZITHROMAX) 250 MG tablet Take 250 mg by mouth daily. Take for 5 days starting 05/21/14   05/23/2014 at Unknown time  . donepezil (ARICEPT) 5 MG tablet Take 5 mg by mouth at bedtime.   05/23/2014 at Unknown time  . feeding supplement, GLUCERNA SHAKE, (GLUCERNA SHAKE) LIQD Take 237 mLs by mouth 2 (two) times daily between meals.   05/23/2014 at Unknown time  . fluticasone (FLONASE) 50 MCG/ACT nasal spray Place 2 sprays into both nostrils 2 (two) times daily.   05/23/2014 at Unknown time  . folic acid (FOLVITE) 1 MG tablet Take 1 tablet (1 mg total) by mouth daily. 90 tablet 3 05/23/2014 at Unknown time  . furosemide (LASIX) 40 MG tablet Take 0.5 tablets (20 mg total) by mouth daily. 30 tablet 6 05/23/2014 at Unknown time  . gabapentin (NEURONTIN) 100 MG capsule Take 100 mg by mouth 2 (two) times daily.    05/23/2014 at Unknown time  . glipiZIDE (GLUCOTROL) 5 MG tablet Take one-half tablet by  mouth daily only if blood  sugar is over 200 (Patient taking differently: Take 2.5 mg by mouth at bedtime. ) 45 tablet 1 05/23/2014 at Unknown time  . guaiFENesin (MUCINEX) 600 MG 12 hr tablet Take 600 mg by mouth 2 (two) times daily as needed for cough or to loosen phlegm.    05/23/2014 at Unknown time  . Hyoscyamine Sulfate 0.375 MG TBCR A1 tab twice a day as needed, abdominal pain 25 tablet 1 Past Week at  Unknown time  . Insulin Glargine (LANTUS SOLOSTAR) 100 UNIT/ML Solostar Pen Inject 10 Units into the skin daily at 10 pm. (Patient taking differently: Inject 6 Units into the skin every morning. ) 5 pen 1 05/23/2014 at Unknown time  . Lancets (ACCU-CHEK MULTICLIX) lancets Use as instructed to check blood sugar 2 times per day dx code 250.00 200 each 1 05/23/2014 at Unknown time  . levothyroxine (SYNTHROID, LEVOTHROID) 50 MCG tablet Take 1 tablet (50 mcg total)  by mouth daily before breakfast. 30 tablet 5 05/23/2014 at Unknown time  . loratadine (CLARITIN) 10 MG tablet Take 10 mg by mouth daily.   05/23/2014 at Unknown time  . meclizine (ANTIVERT) 25 MG tablet Take 25 mg by mouth 3 (three) times daily as needed for dizziness.   05/23/2014 at Unknown time  . metoprolol tartrate (LOPRESSOR) 25 MG tablet Take 1 tablet (25 mg total) by mouth 2 (two) times daily. (Patient taking differently: Take 25 mg by mouth as needed. ) 90 tablet 6 05/23/2014 at 1900  . nitrofurantoin, macrocrystal-monohydrate, (MACROBID) 100 MG capsule Take 100 mg by mouth 2 (two) times daily.   05/23/2014 at Unknown time  . nitroGLYCERIN (NITRODUR - DOSED IN MG/24 HR) 0.2 mg/hr patch Place 1 patch (0.2 mg total) onto the skin daily. 30 patch 6 05/23/2014 at Unknown time  . omeprazole (PRILOSEC) 20 MG capsule Take 1 capsule (20 mg total) by mouth daily. 90 capsule 3 05/23/2014 at Unknown time  . Polyvinyl Alcohol-Povidone (REFRESH OP) Place 1 drop into both eyes daily.    05/23/2014 at Unknown time  . potassium chloride SA (K-DUR,KLOR-CON) 20 MEQ tablet Take 20 mEq by mouth every other day.   05/23/2014 at Unknown time  . ranolazine ([name]) 500 MG 12 hr tablet Take 1 tablet (500 mg total) by mouth daily. 56 tablet 0 05/23/2014 at Unknown time  . rosuvastatin (CRESTOR) 10 MG tablet Take 1 tablet (10 mg total) by mouth 3 (three) times a week. ZOX:[id number] Exp: 8-18 21 tablet 0 05/23/2014 at Unknown time  . senna-docusate (SENOKOT-S) 8.6-50 MG per tablet Take 1  tablet by mouth 2 (two) times daily. (Patient taking differently: Take 1 tablet by mouth as needed. ) 30 tablet 2 Past Week at Unknown time  . tobramycin (TOBREX) 0.3 % ophthalmic solution Place 1 drop into the right eye daily as needed.    05/23/2014 at Unknown time  . traMADol (ULTRAM) 50 MG tablet Take 1 tablet (50 mg total) by mouth every 6 (six) hours as needed for moderate pain. 30 tablet 0 05/23/2014 at Unknown time  . Vitamin D, Ergocalciferol, (DRISDOL) 50000 UNITS CAPS capsule Take 50,000 Units by mouth daily.    05/23/2014 at Unknown time  . warfarin (COUMADIN) 2.5 MG tablet Take 1 to 1.5 tablets by mouth daily as directed by coumadin clinic (Patient taking differently: Take 2.5-3.75 mg by mouth See admin instructions. Take 1 tablet on Sunday and Wednesday. Take 1.5 tablets all other days.) 40 tablet 2 05/23/2014 at Unknown time  . zolpidem (AMBIEN) 10 MG tablet Take 5 mg by mouth at bedtime as needed. sleep   Past Week at Unknown time  . ACCU-CHEK AVIVA PLUS test strip Use to check blood sugars 2 times daily 200 each 1 unknown  . Blood Glucose Monitoring Suppl (ACCU-CHEK AVIVA PLUS) W/DEVICE KIT Use to check blood sugar 2 times per day dx code 250.00 1 kit 0 unknown  . hydrocortisone (ANUSOL-HC) 25 MG suppository Use 1 suppository rectally at bedtime for 7 days. (Patient not taking: Reported on 05/24/2014) 7 suppository 1 Not Taking at Unknown time  . Lancets Misc. (ACCU-CHEK FASTCLIX LANCET) KIT Use as directec 1 kit 1 unknown   Fam HX:    Family History  Problem Relation Age of Onset  . Breast cancer Daughter   . Diabetes Son   . Diabetes Daughter   . Heart disease Father   . Diabetes Father   . Colon polyps Daughter   . Heart  attack Brother   . Diabetes Brother   . Stroke Sister    Social HX:    History   Social History  . Marital Status: Married    Spouse Name: N/A  . Number of Children: 10  . Years of Education: N/A   Occupational History  .     Social History Main Topics    . Smoking status: Never Smoker   . Smokeless tobacco: Never Used  . Alcohol Use: No  . Drug Use: No  . Sexual Activity: No   Other Topics Concern  . Not on file   Social History Narrative     ROS:  All 11 ROS were addressed and are negative except what is stated in the HPI  Physical Exam: Blood pressure 124/68, pulse 70, temperature 97.6 F (36.4 C), temperature source Oral, resp. rate 18, height _0  (1.626 m), weight 184 lb 15.9 oz (83.912 kg), SpO2 96 %.    General: Well developed, well nourished, in no acute distress Head: Eyes PERRLA, No xanthomas.   Normal cephalic and atramatic  Lungs:   Scant crackles at left base Heart:   HRRR S1 S2 Pulses are 2+ & equal.            No carotid bruit. No JVD.  No abdominal bruits. No femoral bruits. Abdomen: Bowel sounds are positive, abdomen soft and non-tender without masses Extremities:   No clubbing, cyanosis or edema.  DP +1 Neuro: Alert and oriented X 3. Psych:  Good affect, responds appropriately    Labs:   Lab Results  Component Value Date   WBC 12.8* 05/25/2014   HGB 10.3* 05/25/2014   HCT 32.4* 05/25/2014   MCV 89.0 05/25/2014   PLT 230 05/25/2014    Recent Labs Lab 05/25/14 0525  NA 133*  K 4.5  CL 98*  CO2 27  BUN 21*  CREATININE 1.64*  CALCIUM 8.7*  GLUCOSE 108*   No results found for: PTT Lab Results  Component Value Date   INR 2.86* 05/25/2014   INR 2.68* 05/24/2014   INR 2.3 05/19/2014   Lab Results  Component Value Date   CKTOTAL 57 02/05/2014   CKMB 0.9 08/10/2007   TROPONINI 0.11* 05/25/2014     Lab Results  Component Value Date   CHOL 170 10/24/2013   CHOL 143 06/24/2013   CHOL 170 01/24/2013   Lab Results  Component Value Date   HDL 30.50* 10/24/2013   HDL 39.60 06/24/2013   HDL 42.50 01/24/2013   Lab Results  Component Value Date   LDLCALC 61 06/24/2013   [place] 95 01/24/2013   [place]  08/10/2007    90        Total Cholesterol/HDL:CHD Risk Coronary Heart Disease  Risk Table                     Men   Women  1/2 Average Risk   3.4   3.3   Lab Results  Component Value Date   TRIG 201.0* 10/24/2013   TRIG 212.0* 06/24/2013   TRIG 165.0* 01/24/2013   Lab Results  Component Value Date   CHOLHDL 6 10/24/2013   CHOLHDL 4 06/24/2013   CHOLHDL 4 01/24/2013   Lab Results  Component Value Date   LDLDIRECT 92.3 10/24/2013      Radiology:  Dg Chest Port 1 View  05/24/2014   CLINICAL DATA:  Dyspnea, onset tonight.  EXAM: PORTABLE CHEST - 1 VIEW  COMPARISON:  02/04/2014  FINDINGS: There is prior sternotomy and CABG. There are intact appearances of the transvenous leads. There are moderate vascular and interstitial congestive changes. No dense airspace consolidation is evident. No large effusion is evident. There is mild unchanged right hemidiaphragm elevation.  IMPRESSION: Mild congestive heart failure   Electronically Signed   By: Andreas Newport M.D.   On: 05/24/2014 02:06    EKG:  NSR with nonspecific IVCD and inferior infarct  ASSESSMENT/PLAN: 1.  Acute on chronic combined systolic/diastolic CHF.  I&O's are incomplete since admission.  Her weight is down 2 lbs.  She has no LE edema on exam, scant crackles at left base and her creatinine has bumped.  I would recommend changing back to PO Lasix but instead of 73m daily would change to 46mdaily and follow renal function closely.   2.  Ischemic DCM with moderate to severe LV dysfunction (EF not assess on last echo 02/05/2014) 3.  ASCAD with remote CABG - on [name] for chronic angina.  Continue ASA/BB/statin and NTG patch 4.  HTN - BP controlled 5.  Type II DM 6.  CKD stage III - creatinine bumped today after diuresis - will follow closely after changing to higher dose of PO Lasix 7.  PAF s/p PPM on chronic anticoagulation with coumadin - INR therapeutic 8.  Elevated troponin most likely secondary to CHF and renal failure.  Fairly flat trend.    TUSueanne MargaritaMD  05/25/2014  9:46 AM

## 2014-05-26 DIAGNOSIS — N179 Acute kidney failure, unspecified: Secondary | ICD-10-CM | POA: Diagnosis not present

## 2014-05-26 DIAGNOSIS — J9601 Acute respiratory failure with hypoxia: Secondary | ICD-10-CM | POA: Diagnosis not present

## 2014-05-26 DIAGNOSIS — I5043 Acute on chronic combined systolic (congestive) and diastolic (congestive) heart failure: Secondary | ICD-10-CM | POA: Diagnosis not present

## 2014-05-26 DIAGNOSIS — I5033 Acute on chronic diastolic (congestive) heart failure: Secondary | ICD-10-CM | POA: Diagnosis not present

## 2014-05-26 LAB — GLUCOSE, CAPILLARY
GLUCOSE-CAPILLARY: 92 mg/dL (ref 70–99)
GLUCOSE-CAPILLARY: 135 mg/dL — AB (ref 70–99)
GLUCOSE-CAPILLARY: 152 mg/dL — AB (ref 70–99)
Glucose-Capillary: 133 mg/dL — ABNORMAL HIGH (ref 70–99)

## 2014-05-26 LAB — CBC
HCT: 34.7 % — ABNORMAL LOW (ref 36.0–46.0)
Hemoglobin: 11 g/dL — ABNORMAL LOW (ref 12.0–15.0)
MCH: 28 pg (ref 26.0–34.0)
MCHC: 31.7 g/dL (ref 30.0–36.0)
MCV: 88.3 fL (ref 78.0–100.0)
PLATELETS: 228 10*3/uL (ref 150–400)
RBC: 3.93 MIL/uL (ref 3.87–5.11)
RDW: 13.9 % (ref 11.5–15.5)
WBC: 8.6 10*3/uL (ref 4.0–10.5)

## 2014-05-26 LAB — BASIC METABOLIC PANEL
Anion gap: 10 (ref 5–15)
BUN: 24 mg/dL — AB (ref 6–20)
CO2: 26 mmol/L (ref 22–32)
CREATININE: 1.46 mg/dL — AB (ref 0.44–1.00)
Calcium: 9 mg/dL (ref 8.9–10.3)
Chloride: 98 mmol/L — ABNORMAL LOW (ref 101–111)
GFR, EST AFRICAN AMERICAN: 35 mL/min — AB (ref >60)
GFR, EST NON AFRICAN AMERICAN: 30 mL/min — AB (ref >60)
Glucose, Bld: 111 mg/dL — ABNORMAL HIGH (ref 70–99)
Potassium: 4.8 mmol/L (ref 3.5–5.1)
Sodium: 134 mmol/L — ABNORMAL LOW (ref 135–145)

## 2014-05-26 LAB — HEMOGLOBIN A1C
Hgb A1c MFr Bld: 7.3 % — ABNORMAL HIGH (ref 4.8–5.6)
MEAN PLASMA GLUCOSE: 163 mg/dL

## 2014-05-26 LAB — PROTIME-INR
INR: 2.42 — ABNORMAL HIGH (ref 0.00–1.49)
PROTHROMBIN TIME: 26.5 s — AB (ref 11.6–15.2)

## 2014-05-26 MED ORDER — WARFARIN SODIUM 2.5 MG PO TABS
3.7500 mg | ORAL_TABLET | Freq: Once | ORAL | Status: AC
  Administered 2014-05-26: 3.75 mg via ORAL
  Filled 2014-05-26: qty 1

## 2014-05-26 NOTE — Evaluation (Signed)
Physical Therapy Evaluation Patient Details Name: [name] MRN: [id number] DOB: January 13, 1921 Today's Date: 05/26/2014   History of Present Illness  [name] is a 81 y.o. female Chronic Diastolic CHF, A.Fib , CAD S/P CABG, S/P Pacemaker, DM2 and HTN who presents to the ED with complaints of worsening SOB with CHF exacerbation  Clinical Impression  Pt with caregiver support from 4 children all the time at home. Son states pt pulls on children for transfers and requires assist with all mobility but able to support self in standing against bed for pericare. Pt and son educated for pt and caregiver safety with transfers with appropriate assist technique and pt hand placement. Encouraged son to continue education for family. Pt educated for HEP and encouraged continued performance as well. Pt with general deconditioning receiving HHPT currently who will benefit from acute therapy to maximize mobility and function acutely to decrease burden of care. Recommend continued HHPT at D/C as well as daily mobility OOB with nursing staff.      Follow Up Recommendations Home health PT;Supervision/Assistance - 24 hour    Equipment Recommendations  None recommended by PT    Recommendations for Other Services       Precautions / Restrictions Precautions Precautions: Fall      Mobility  Bed Mobility Overal bed mobility: Needs Assistance Bed Mobility: Rolling;Sidelying to Sit Rolling: Min assist Sidelying to sit: Mod assist       General bed mobility comments: cues for sequence with use of rail to roll, assist to bring legs off bed and elevate trunk  Transfers Overall transfer level: Needs assistance   Transfers: Sit to/from Stand;Stand Pivot Transfers Sit to Stand: Mod assist Stand pivot transfers: Min assist       General transfer comment: cues for hand placement, afety and sequence, pt holding P.T. arms for support with transfer  Ambulation/Gait                 Stairs            Wheelchair Mobility    Modified Rankin (Stroke Patients Only)       Balance Overall balance assessment: Needs assistance   Sitting balance-Leahy Scale: Good       Standing balance-Leahy Scale: Poor                               Pertinent Vitals/Pain Pain Assessment: No/denies pain    Home Living Family/patient expects to be discharged to:: Private residence Living Arrangements: Children Available Help at Discharge: Family;Available 24 hours/day Type of Home: House Home Access: Ramped entrance     Home Layout: One level Home Equipment: Walker - 2 wheels;Bedside commode;Wheelchair - manual;Hand held shower head;Adaptive equipment;Hospital bed      Prior Function Level of Independence: Needs assistance   Gait / Transfers Assistance Needed: pt is typically min-mod assist to transfer to EOB, pivot to [facility] and WC and recently has been able to walk 5' with RW and P.T. at home  ADL's / Homemaking Assistance Needed: wears adult briefs with total assist for pericare and max assist for bathing and dressing        Hand Dominance        Extremity/Trunk Assessment   Upper Extremity Assessment: Generalized weakness           Lower Extremity Assessment: Generalized weakness      Cervical / Trunk Assessment: Kyphotic  Communication   Communication: HOH (  deaf Left ear, blind right eye)  Cognition Arousal/Alertness: Awake/alert Behavior During Therapy: WFL for tasks assessed/performed Overall Cognitive Status: Difficult to assess                      General Comments      Exercises General Exercises - Lower Extremity Long Arc Quad: AROM;Seated;Both;10 reps Hip Flexion/Marching: AROM;Seated;Both;10 reps      Assessment/Plan    PT Assessment Patient needs continued PT services  PT Diagnosis Difficulty walking;Generalized weakness   PT Problem List Decreased strength;Decreased activity tolerance;Decreased  balance;Decreased knowledge of use of DME;Obesity;Decreased mobility  PT Treatment Interventions DME instruction;Functional mobility training;Therapeutic activities;Therapeutic exercise;Balance training;Patient/family education   PT Goals (Current goals can be found in the Care Plan section) Acute Rehab PT Goals Patient Stated Goal: return home PT Goal Formulation: With patient/family Time For Goal Achievement: 06/09/14 Potential to Achieve Goals: Fair    Frequency Min 2X/week   Barriers to discharge        Co-evaluation               End of Session Equipment Utilized During Treatment: Gait belt Activity Tolerance: Patient tolerated treatment well Patient left: in chair;with call bell/phone within reach;with chair alarm set;with family/visitor present Nurse Communication: Mobility status;Precautions         Time: 4628-6381 PT Time Calculation (min) (ACUTE ONLY): 30 min   Charges:   PT Evaluation $Initial PT Evaluation Tier I: 1 Procedure PT Treatments $Therapeutic Activity: 8-22 mins   PT G CodesMelford Aase 05/26/2014, 3:20 PM Elwyn Reach, [place]

## 2014-05-26 NOTE — Progress Notes (Signed)
PROGRESS NOTE    [name] IEP:[id number] DOB: 07-03-1920 DOA: 05/24/2014 PCP: Kevan Ny, MD  Primary Cardiologist: Dr. Sanda Klein  HPI/Brief narrative 81 y.o. female Chronic systolic CHF, A.Fib on Coumadin Rx, CAD S/P CABG, S/P Pacemaker, DM2 and HTN who presents to the ED with complaints of worsening SOB since 8 pm. She has intermittent Chest pain chronically and is on [name] Rx. She had a recent URI and was treated with outpatient Azithromycin. She complains of DOE, and Orthopnea. She was evaluated in the ED and was found to have hypoxia and was placed on supplemental Oxygen when her O2 sats decreased into the mid 80'S. She was found to have an exacerbation of CHF by Chest X-ray and BNP and then she was administered IV Lasix x 1 dose and referred for admission.   Assessment/Plan:  Acute on chronic systolic and diastolic CHF/moderate to severe ischemic cardiomyopathy - 2-D echo 02/05/14: LVEF at least moderate-severely depressed with wall motion abnormalities. Grade 1 diastolic dysfunction. - Precipitating factor: Recent URI versus dietary indiscretion (likes to eat salted cheese balls) - Patient was started on IV Lasix on admission. Weight down 2 pounds but intake output not adequately charted. - Creatinine had bumped up from normal to 1.6. Cardiology input appreciated and changed Lasix to 40 mg orally daily. Holding lisinopril. - Continue metolazone and [name] (on for chronic angina). - Improving.  Acute respiratory failure with hypoxia - Secondary to decompensated CHF - Treat underlying cause and wean off oxygen as tolerated  Paroxysmal atrial fibrillation - Controlled ventricular rate - Continue metoprolol and Coumadin per pharmacy - Anticoagulated  Complete heart block, status post permanent pacemaker  CAD status post CABG/chronic intermittent chest pain - Continue medical management: Aspirin, beta blockers, [name] and statins - Mildly elevated  troponin likely demand ischemia from decompensated CHF and AKI. Flat trend. 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  History of lower extremity DVT - Anticoagulated on Coumadin  Essential hypertension - Controlled  Acute on stage III chronic kidney disease - Creatinine jumped from 1.01 on admission to 1.64 the next day. Likely secondary to IV Lasix. - Lasix changed to by mouth. Holding ACEI. - Creatinine has improved from 1.6 > 1.4. Follow BMP in a.m.  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  Disposition Plan: DC home when medically stable, possibly in 1 days.   Consultants:  Cardiology  Procedures:  None  Antibiotics:  None   Subjective: Denied cough or dyspnea. No chest pain reported. As per daughter at bedside, no acute events.  Objective: Filed Vitals:   05/26/14 0500 05/26/14 0728 05/26/14 1057 05/26/14 1400  BP:  127/57 138/56 135/64  Pulse:  61 64 64  Temp:  97.5 F (36.4 C)  98 F (36.7 C)  TempSrc:  Oral  Oral  Resp:  20  18  Height:      Weight: 84.641 kg (186 lb 9.6 oz)     SpO2:  97%  99%    Intake/Output Summary (Last 24 hours) at 05/26/14 1818 Last data filed at 05/26/14 1400  Gross per 24 hour  Intake    460 ml  Output      0  ml  Net    460 ml   Filed Weights   05/24/14 0813 05/25/14 0613 05/26/14 0500  Weight: 84.55 kg (186 lb 6.4 oz) 83.912 kg (184 lb 15.9 oz) 84.641 kg (186 lb 9.6 oz)     Exam:  General exam: Pleasant elderly female lying comfortably propped up in bed. Respiratory system: Few fine basal crackles otherwise clear to auscultation. No increased work of breathing. Cardiovascular system: S1  & S2 heard, RRR. No JVD, murmurs, gallops, clicks. 1+ pitting bilateral leg edema-slightly better . Telemetry: V paced/SR.  Gastrointestinal system: Abdomen is obese/nondistended, soft and nontender. Normal bowel sounds heard. Central nervous system: Alert and oriented. No focal neurological deficits. Hard of hearing. Extremities: Symmetric 5 x 5 power.   Data Reviewed: Basic Metabolic Panel:  Recent Labs Lab 05/24/14 0145 05/25/14 0525 05/26/14 0443  NA 134* 133* 134*  K 4.5 4.5 4.8  CL 99* 98* 98*  CO2 25 27 26   GLUCOSE 204* 108* 111*  BUN 11 21* 24*  CREATININE 1.01* 1.64* 1.46*  CALCIUM 9.0 8.7* 9.0   Liver Function Tests: No results for input(s): AST, ALT, ALKPHOS, BILITOT, PROT, ALBUMIN in the last 168 hours. No results for input(s): LIPASE, AMYLASE in the last 168 hours. No results for input(s): AMMONIA in the last 168 hours. CBC:  Recent Labs Lab 05/24/14 0145 05/25/14 0525 05/26/14 0443  WBC 17.9* 12.8* 8.6  NEUTROABS 16.4*  --   --   HGB 12.5 10.3* 11.0*  HCT 39.8 32.4* 34.7*  MCV 90.9 89.0 88.3  PLT 266 230 228   Cardiac Enzymes:  Recent Labs Lab 05/24/14 1714 05/24/14 2249 05/25/14 0525  TROPONINI 0.07* 0.03 0.11*   BNP (last 3 results) No results for input(s): PROBNP in the last 8760 hours. CBG:  Recent Labs Lab 05/25/14 1654 05/25/14 2139 05/26/14 0606 05/26/14 1128 05/26/14 1557  GLUCAP 129* 133* 133* 92 135*    No results found for this or any previous visit (from the past 240 hour(s)).       Studies: No results found.      Scheduled Meds: . aspirin  81 mg Oral Daily  . atropine  1 drop Right Eye QID  . donepezil  5 mg Oral QHS  . feeding supplement (GLUCERNA SHAKE)  237 mL Oral BID BM  . folic acid  1 mg Oral Daily  . furosemide  40 mg Oral Daily  . gabapentin  100 mg Oral BID  . insulin aspart  0-5 Units Subcutaneous QHS  . insulin aspart  0-9 Units Subcutaneous TID WC  . insulin glargine  6 Units Subcutaneous  Daily  . levothyroxine  50 mcg Oral QAC breakfast  . loratadine  10 mg Oral Daily  . metoprolol tartrate  25 mg Oral BID  . pantoprazole  40 mg Oral Daily  . potassium chloride  10 mEq Oral Daily  . ranolazine  500 mg Oral Daily  . rosuvastatin  10 mg Oral Q M,W,F-1800  . sodium chloride  3 mL Intravenous Q12H  . tobramycin  1 drop Right Eye Daily  . warfarin  3.75 mg Oral ONCE-1800  . Warfarin - Pharmacist Dosing Inpatient   Does not apply q1800   Continuous Infusions:   Principal Problem:   Acute on chronic diastolic heart failure Active Problems:   Hypertension   Long term current use of anticoagulant therapy   Diabetes mellitus type 2, uncontrolled   Hypothyroidism   A-fib   Abdominal pain, left lower quadrant  CHF exacerbation   SOB (shortness of breath)   UTI (urinary tract infection)    Time spent: 25 mins    Theodoros Stjames, MD, FACP, FHM. [facility] Pager [phone]  If 7PM-7AM, please contact night-coverage [email or web address] Password [email or web address] 05/26/2014, 6:18 PM    LOS: 2 days

## 2014-05-26 NOTE — Progress Notes (Addendum)
[facility] for warfarin Indication: atrial fibrillation  Allergies  Allergen Reactions  . Darvon     Unknown reaction   . Digoxin And Related Nausea Only  . Penicillins Other (See Comments)    Was told had allergy from childhood... Unknown reaction  . Percocet [Oxycodone-Acetaminophen] Other (See Comments)    confused  . Percodan [Oxycodone-Aspirin]   . Vicodin [Hydrocodone-Acetaminophen] Other (See Comments)    confused    Patient Measurements: Height: 5\' 4"  (162.6 cm) Weight: 186 lb 9.6 oz (84.641 kg) IBW/kg (Calculated) : 54.7  Vital Signs: Temp: 97.5 F (36.4 C) (05/10 0728) Temp Source: Oral (05/10 0728) BP: 127/57 mmHg (05/10 0728) Pulse Rate: 61 (05/10 0728)  Labs:  Recent Labs  05/24/14 0145 05/24/14 1000 05/24/14 1714 05/24/14 2249 05/25/14 0525 05/26/14 0443  HGB 12.5  --   --   --  10.3* 11.0*  HCT 39.8  --   --   --  32.4* 34.7*  PLT 266  --   --   --  230 228  LABPROT  --  28.8*  --   --  30.3* 26.5*  INR  --  2.68*  --   --  2.86* 2.42*  CREATININE 1.01*  --   --   --  1.64* 1.46*  TROPONINI  --   --  0.07* 0.03 0.11*  --     Estimated Creatinine Clearance: 26.4 mL/min (by C-G formula based on Cr of 1.46).  Assessment: 81 y.o. female Chronic systolic CHF, A.Fib on Coumadin Rx, CAD S/P CABG, S/P Pacemaker, DM2 and HTN who presents to the ED with complaints of worsening SOB since 8 pm.  INR on admission is 2.68 (at goal) no bleeding issues on arrival. CBC within normal limits. INR is therapeutic at 2.42  Warfarin dose pta: 3.75mg  daily except 2.5mg  on Sunday and Wednesday.  Goal of Therapy:  INR 2-3 Monitor platelets by anticoagulation protocol: Yes   Plan:  Coumadin 3.75 mg po x 1 dose today Daily INR for now  Thank you. Lesleigh Noe, PharmD Candidate  Agree with above Thank you. Anette Guarneri, PharmD  05/26/2014 10:12 AM

## 2014-05-26 NOTE — Progress Notes (Signed)
Pt is very incontinent, UO charted every time pt pee.

## 2014-05-26 NOTE — Progress Notes (Signed)
Patient Profile: This is a 81 y.o. Widowed, female who is nonambulatory (uses a wheelchair) and has a history of coronary disease, previous bypass surgery, permanent pacemaker for high-grade AV block, paroxysmal atrial fibrillation, recent upper extremity deep venous thrombosis associated with a venous catheter and recurrent problems with severe anemia due to gastrointestinal bleeding. She is on coumadin. She is followed by Dr. Sallyanne Kuster. She had a nuc study for an episode of chest pain night last fall. Nuc study was noted to be: High risk stress nuclear study with large, severe intensity, partially reversible, inferior and apical defect consistent with prior inferior apical infarct and mild peri-infarct ischemia towards the apex; perfusion similar to previous study; study high risk due to reduced LV function. LV Wall Motion: Inferior and apical akinesis; LV function worse  compared to previous. Patient did not wish to undergo LHC. Medical therapy elected. She also has CKD 3 bordering on 4.   She presented to [facility] on 5/8 with DOE and orthopnea.Admitted for a/c combined systolic + diastolic CHF. Her BNP was mildly elevated at 169. Troponin was mildly elevated at 0.11 with a fairly flat trend. Chest xray showed mild CHF. She was started on IV lasix but recently converted back to PO at a higher dose.   Subjective: No complaints. Denies resting dyspnea and no CP. Notes good UOP.   Objective: Vital signs in last 24 hours: Temp:  [97.5 F (36.4 C)-98.7 F (37.1 C)] 97.5 F (36.4 C) (05/10 0728) Pulse Rate:  [61-82] 61 (05/10 0728) Resp:  [16-20] 20 (05/10 0728) BP: (119-134)/(54-68) 127/57 mmHg (05/10 0728) SpO2:  [95 %-99 %] 97 % (05/10 0728) Weight:  [186 lb 9.6 oz (84.641 kg)] 186 lb 9.6 oz (84.641 kg) (05/10 0500) Last BM Date: 05/24/14  Intake/Output from previous day: 05/09 0701 - 05/10 0700 In: 480 [P.O.:480] Out: -  Intake/Output this shift:    Medications Current  Facility-Administered Medications  Medication Dose Route Frequency Provider Last Rate Last Dose  . 0.9 %  sodium chloride infusion  250 mL Intravenous PRN Theressa Millard, MD      . acetaminophen (TYLENOL) tablet 650 mg  650 mg Oral Q6H PRN Theressa Millard, MD   650 mg at 05/25/14 2136   Or  . acetaminophen (TYLENOL) suppository 650 mg  650 mg Rectal Q6H PRN Theressa Millard, MD      . alum & mag hydroxide-simeth (MAALOX/MYLANTA) 200-200-20 MG/5ML suspension 30 mL  30 mL Oral Q6H PRN Theressa Millard, MD      . aspirin chewable tablet 81 mg  81 mg Oral Daily Theressa Millard, MD   81 mg at 05/25/14 1117  . atropine 1 % ophthalmic solution 1 drop  1 drop Right Eye QID Theressa Millard, MD   1 drop at 05/25/14 2140  . donepezil (ARICEPT) tablet 5 mg  5 mg Oral QHS Theressa Millard, MD   5 mg at 05/25/14 2136  . feeding supplement (GLUCERNA SHAKE) (GLUCERNA SHAKE) liquid 237 mL  237 mL Oral BID BM Theressa Millard, MD   237 mL at 05/25/14 1400  . folic acid (FOLVITE) tablet 1 mg  1 mg Oral Daily Theressa Millard, MD   1 mg at 05/25/14 1116  . furosemide (LASIX) tablet 40 mg  40 mg Oral Daily Modena Jansky, MD   40 mg at 05/25/14 1535  . gabapentin (NEURONTIN) capsule 100 mg  100 mg Oral BID Theressa Millard, MD   100  mg at 05/25/14 2136  . insulin aspart (novoLOG) injection 0-5 Units  0-5 Units Subcutaneous QHS Theressa Millard, MD   0 Units at 05/24/14 2209  . insulin aspart (novoLOG) injection 0-9 Units  0-9 Units Subcutaneous TID WC Theressa Millard, MD   1 Units at 05/26/14 [phone]  . insulin glargine (LANTUS) injection 6 Units  6 Units Subcutaneous Daily Theressa Millard, MD   6 Units at 05/25/14 1116  . levothyroxine (SYNTHROID, LEVOTHROID) tablet 50 mcg  50 mcg Oral QAC breakfast Theressa Millard, MD   50 mcg at 05/26/14 0548  . loratadine (CLARITIN) tablet 10 mg  10 mg Oral Daily Theressa Millard, MD   10 mg at 05/25/14 1115  . metoprolol tartrate (LOPRESSOR)  tablet 25 mg  25 mg Oral BID Theressa Millard, MD   25 mg at 05/25/14 2140  . morphine 2 MG/ML injection 1-2 mg  1-2 mg Intravenous Q4H PRN Theressa Millard, MD      . ondansetron (ZOFRAN) tablet 4 mg  4 mg Oral Q6H PRN Theressa Millard, MD       Or  . ondansetron (ZOFRAN) injection 4 mg  4 mg Intravenous Q6H PRN Theressa Millard, MD      . pantoprazole (PROTONIX) EC tablet 40 mg  40 mg Oral Daily Theressa Millard, MD   40 mg at 05/25/14 1117  . potassium chloride (K-DUR,KLOR-CON) CR tablet 10 mEq  10 mEq Oral Daily Theressa Millard, MD   10 mEq at 05/25/14 1116  . ranolazine ([name]) 12 hr tablet 500 mg  500 mg Oral Daily Theressa Millard, MD   500 mg at 05/25/14 1115  . rosuvastatin (CRESTOR) tablet 10 mg  10 mg Oral Q M,W,F-1800 Theressa Millard, MD   10 mg at 05/25/14 1727  . sodium chloride 0.9 % injection 3 mL  3 mL Intravenous Q12H Theressa Millard, MD   3 mL at 05/25/14 1000  . sodium chloride 0.9 % injection 3 mL  3 mL Intravenous PRN Theressa Millard, MD      . tobramycin (TOBREX) 0.3 % ophthalmic solution 1 drop  1 drop Right Eye Daily Theressa Millard, MD   1 drop at 05/25/14 1117  . Warfarin - Pharmacist Dosing Inpatient   Does not apply q1800 Theressa Millard, MD      . zolpidem [facility]) tablet 5 mg  5 mg Oral QHS PRN Theressa Millard, MD   5 mg at 05/25/14 2206   Filed Weights   05/24/14 0813 05/25/14 0613 05/26/14 0500  Weight: 186 lb 6.4 oz (84.55 kg) 184 lb 15.9 oz (83.912 kg) 186 lb 9.6 oz (84.641 kg)     PE: General appearance: alert, cooperative and no distress Neck: no carotid bruit and no JVD Lungs: decreased BS at the bases Heart: regular rate and rhythm Extremities: no LEE Pulses: 2+ and symmetric Skin: warm and dry Neurologic: Grossly normal  Lab Results:   Recent Labs  05/24/14 0145 05/25/14 0525 05/26/14 0443  WBC 17.9* 12.8* 8.6  HGB 12.5 10.3* 11.0*  HCT 39.8 32.4* 34.7*  PLT 266 230 228   BMET  Recent Labs   05/24/14 0145 05/25/14 0525 05/26/14 0443  NA 134* 133* 134*  K 4.5 4.5 4.8  CL 99* 98* 98*  CO2 25 27 26   GLUCOSE 204* 108* 111*  BUN 11 21* 24*  CREATININE 1.01* 1.64* 1.46*  CALCIUM 9.0 8.7* 9.0  PT/INR  Recent Labs  05/24/14 1000 05/25/14 0525 05/26/14 0443  LABPROT 28.8* 30.3* 26.5*  INR 2.68* 2.86* 2.42*   Cardiac Panel (last 3 results)  Recent Labs  05/24/14 1714 05/24/14 2249 05/25/14 0525  TROPONINI 0.07* 0.03 0.11*      Assessment/Plan  Principal Problem:   Acute on chronic diastolic heart failure Active Problems:   Hypertension   Long term current use of anticoagulant therapy   Diabetes mellitus type 2, uncontrolled   Hypothyroidism   A-fib   Abdominal pain, left lower quadrant   CHF exacerbation   SOB (shortness of breath)   UTI (urinary tract infection)    1. Acute on chronic combined systolic/diastolic CHF: breathing has improved. I/Os not accurate as UOP not recorded, but patient reports good UOP. No LEE. Decreased BS at the bases. Continue PO Lasix, BB and low sodium diet.   2. Ischemic DCM:  with moderate to severe LV dysfunction (EF not assess on last echo 02/05/2014). Continue BB therapy. No ACE/ARB due to CKD.   3. CAD: She is CP free. Has known CAD and had high risk NST in the fall, but given advanced age, CKD, poor memory and poor baseline functional status, patient elected not to pursue cath. No plans for any invasive w/u this admit. Continue medical therapy: ASA, statin, BB and [name].    4. HTN: well controlled on current regimen at 127/57  5. T2DM: management per primary  6. CKD: tolerating higher dose of PO Lasix with stable renal function.SCR improved since yesterday from 1.64-->1.46. Will need f/u BMP as an OP.   7. PAF: Has PPM. Paced rhythm on tele. Rate is controlled. On coumadin for a/c with therapeutic INR at 2.42.   8. Elevated Troponin: minimaly elevated at 0.07-->0.03-->0.11. Has known CAD and had high risk NST in  the fall, but given advanced age, CKD, poor memory and poor baseline functional status, patient elected not to pursue cath. No plans for any invasive w/u this admit. Continue medical therapy.     LOS: 2 days    Brittainy M. Ladoris Gene 05/26/2014 10:07 AM  Attending Note:   The patient was seen and examined.  Agree with assessment and plan as noted above.  Changes made to the above note as needed.  She seems to be feeling a bit better .  Lungs sound ok I suspect I/Os are not correct as they do not show any diuresis.  Hopefully home in several days   Thayer Headings, Brooke Bonito., MD, [facility] 05/26/2014, 2:05 PM [address]  [place] Pager [phone]

## 2014-05-27 ENCOUNTER — Ambulatory Visit: Payer: Medicare Other | Admitting: Physician Assistant

## 2014-05-27 DIAGNOSIS — I10 Essential (primary) hypertension: Secondary | ICD-10-CM | POA: Diagnosis not present

## 2014-05-27 DIAGNOSIS — E1165 Type 2 diabetes mellitus with hyperglycemia: Secondary | ICD-10-CM | POA: Diagnosis not present

## 2014-05-27 DIAGNOSIS — R7989 Other specified abnormal findings of blood chemistry: Secondary | ICD-10-CM

## 2014-05-27 DIAGNOSIS — N183 Chronic kidney disease, stage 3 (moderate): Secondary | ICD-10-CM | POA: Diagnosis not present

## 2014-05-27 DIAGNOSIS — I5033 Acute on chronic diastolic (congestive) heart failure: Secondary | ICD-10-CM | POA: Diagnosis not present

## 2014-05-27 LAB — GLUCOSE, CAPILLARY
GLUCOSE-CAPILLARY: 94 mg/dL (ref 70–99)
GLUCOSE-CAPILLARY: 114 mg/dL — AB (ref 70–99)
Glucose-Capillary: 122 mg/dL — ABNORMAL HIGH (ref 70–99)

## 2014-05-27 LAB — BASIC METABOLIC PANEL
ANION GAP: 12 (ref 5–15)
BUN: 24 mg/dL — AB (ref 6–20)
CALCIUM: 9.3 mg/dL (ref 8.9–10.3)
CO2: 26 mmol/L (ref 22–32)
CREATININE: 1.32 mg/dL — AB (ref 0.44–1.00)
Chloride: 99 mmol/L — ABNORMAL LOW (ref 101–111)
GFR calc Af Amer: 40 mL/min — ABNORMAL LOW (ref >60)
GFR calc non Af Amer: 34 mL/min — ABNORMAL LOW (ref >60)
Glucose, Bld: 88 mg/dL (ref 70–99)
POTASSIUM: 4.4 mmol/L (ref 3.5–5.1)
Sodium: 137 mmol/L (ref 135–145)

## 2014-05-27 LAB — PROTIME-INR
INR: 1.8 — ABNORMAL HIGH (ref 0.00–1.49)
Prothrombin Time: 21 seconds — ABNORMAL HIGH (ref 11.6–15.2)

## 2014-05-27 MED ORDER — WARFARIN SODIUM 4 MG PO TABS
4.0000 mg | ORAL_TABLET | Freq: Once | ORAL | Status: AC
  Administered 2014-05-27: 4 mg via ORAL
  Filled 2014-05-27: qty 1

## 2014-05-27 NOTE — Progress Notes (Signed)
ANTICOAGULATION CONSULT NOTE - Follow Up Consult  Pharmacy Consult for Coumadin Indication: atrial fibrillation  Allergies  Allergen Reactions  . Darvon     Unknown reaction   . Digoxin And Related Nausea Only  . Penicillins Other (See Comments)    Was told had allergy from childhood... Unknown reaction  . Percocet [Oxycodone-Acetaminophen] Other (See Comments)    confused  . Percodan [Oxycodone-Aspirin]   . Vicodin [Hydrocodone-Acetaminophen] Other (See Comments)    confused    Patient Measurements: Height: 5\' 4"  (162.6 cm) Weight: 181 lb 3.2 oz (82.192 kg) (bedscale) IBW/kg (Calculated) : 54.7   Vital Signs: Temp: 97.8 F (36.6 C) (05/11 0446) Temp Source: Oral (05/11 0446) BP: 147/59 mmHg (05/11 0949) Pulse Rate: 60 (05/11 0949)  Labs:  Recent Labs  05/24/14 1714 05/24/14 2249 05/25/14 0525 05/26/14 0443 05/27/14 0545 05/27/14 0829  HGB  --   --  10.3* 11.0*  --   --   HCT  --   --  32.4* 34.7*  --   --   PLT  --   --  230 228  --   --   LABPROT  --   --  30.3* 26.5*  --  21.0*  INR  --   --  2.86* 2.42*  --  1.80*  CREATININE  --   --  1.64* 1.46* 1.32*  --   TROPONINI 0.07* 0.03 0.11*  --   --   --     Estimated Creatinine Clearance: 28.8 mL/min (by C-G formula based on Cr of 1.32).   Medications:  Scheduled:  . aspirin  81 mg Oral Daily  . atropine  1 drop Right Eye QID  . donepezil  5 mg Oral QHS  . feeding supplement (GLUCERNA SHAKE)  237 mL Oral BID BM  . folic acid  1 mg Oral Daily  . furosemide  40 mg Oral Daily  . gabapentin  100 mg Oral BID  . insulin aspart  0-5 Units Subcutaneous QHS  . insulin aspart  0-9 Units Subcutaneous TID WC  . insulin glargine  6 Units Subcutaneous Daily  . levothyroxine  50 mcg Oral QAC breakfast  . loratadine  10 mg Oral Daily  . metoprolol tartrate  25 mg Oral BID  . pantoprazole  40 mg Oral Daily  . potassium chloride  10 mEq Oral Daily  . ranolazine  500 mg Oral Daily  . rosuvastatin  10 mg Oral Q  M,W,F-1800  . sodium chloride  3 mL Intravenous Q12H  . tobramycin  1 drop Right Eye Daily  . Warfarin - Pharmacist Dosing Inpatient   Does not apply q1800    Assessment: Patient is a 81 year ld female admitted for heart failure. Her current INR is low at 1.80. Her INR has been decreasing over the past few days.   Goal of Therapy:  INR 2-3 Monitor platelets by anticoagulation protocol: Yes   Plan:  Stop Coumadin 3.75 mg po x 1 dose. Initiate Coumadin 4 mg po x 1 dose. Monitor INR daily Monitor for signs and symptoms of bleeding daily  Modena Slater PharmD Candidate 05/27/2014,11:16 AM   Agree Thank you. Anette Guarneri, PharmD

## 2014-05-27 NOTE — Progress Notes (Signed)
Patient Profile: This is a 81 y.o. Widowed, female who is nonambulatory (uses a wheelchair) and has a history of coronary disease, previous bypass surgery, permanent pacemaker for high-grade AV block, paroxysmal atrial fibrillation, recent upper extremity deep venous thrombosis associated with a venous catheter and recurrent problems with severe anemia due to gastrointestinal bleeding. She is on coumadin. She is followed by Dr. Sallyanne Kuster. She had a nuc study for an episode of chest pain night last fall. Nuc study was noted to be: High risk stress nuclear study with large, severe intensity, partially reversible, inferior and apical defect consistent with prior inferior apical infarct and mild peri-infarct ischemia towards the apex; perfusion similar to previous study; study high risk due to reduced LV function. LV Wall Motion: Inferior and apical akinesis; LV function worse  compared to previous. Patient did not wish to undergo LHC. Medical therapy elected. She also has CKD 3 bordering on 4.   She presented to [facility] on 5/8 with DOE and orthopnea.Admitted for a/c combined systolic + diastolic CHF. Her BNP was mildly elevated at 169. Troponin was mildly elevated at 0.11 with a fairly flat trend. Chest xray showed mild CHF. She was started on IV lasix but recently converted back to PO at a higher dose.   Subjective: Pt has some chest burning this am .   Objective: Vital signs in last 24 hours: Temp:  [97.8 F (36.6 C)-98.1 F (36.7 C)] 97.8 F (36.6 C) (05/11 0446) Pulse Rate:  [60-67] 60 (05/11 0949) Resp:  [18] 18 (05/11 0949) BP: (115-147)/(49-64) 147/59 mmHg (05/11 0949) SpO2:  [95 %-100 %] 100 % (05/11 0949) Weight:  [82.192 kg (181 lb 3.2 oz)] 82.192 kg (181 lb 3.2 oz) (05/11 0446) Last BM Date: 05/26/14  Intake/Output from previous day: 05/10 0701 - 05/11 0700 In: 1180 [P.O.:1180] Out: 0  Intake/Output this shift: Total I/O In: 680 [P.O.:680] Out: -   Medications Current  Facility-Administered Medications  Medication Dose Route Frequency Provider Last Rate Last Dose  . 0.9 %  sodium chloride infusion  250 mL Intravenous PRN Theressa Millard, MD      . acetaminophen (TYLENOL) tablet 650 mg  650 mg Oral Q6H PRN Theressa Millard, MD   650 mg at 05/27/14 0801   Or  . acetaminophen (TYLENOL) suppository 650 mg  650 mg Rectal Q6H PRN Theressa Millard, MD      . alum & mag hydroxide-simeth (MAALOX/MYLANTA) 200-200-20 MG/5ML suspension 30 mL  30 mL Oral Q6H PRN Theressa Millard, MD      . aspirin chewable tablet 81 mg  81 mg Oral Daily Theressa Millard, MD   81 mg at 05/27/14 0951  . atropine 1 % ophthalmic solution 1 drop  1 drop Right Eye QID Theressa Millard, MD   1 drop at 05/27/14 [phone]  . donepezil (ARICEPT) tablet 5 mg  5 mg Oral QHS Theressa Millard, MD   5 mg at 05/26/14 2123  . feeding supplement (GLUCERNA SHAKE) (GLUCERNA SHAKE) liquid 237 mL  237 mL Oral BID BM Theressa Millard, MD   237 mL at 05/27/14 1000  . folic acid (FOLVITE) tablet 1 mg  1 mg Oral Daily Theressa Millard, MD   1 mg at 05/27/14 2683  . furosemide (LASIX) tablet 40 mg  40 mg Oral Daily Modena Jansky, MD   40 mg at 05/27/14 4196  . gabapentin (NEURONTIN) capsule 100 mg  100 mg Oral BID Theressa Millard, MD  100 mg at 05/27/14 0953  . insulin aspart (novoLOG) injection 0-5 Units  0-5 Units Subcutaneous QHS Theressa Millard, MD   0 Units at 05/24/14 2209  . insulin aspart (novoLOG) injection 0-9 Units  0-9 Units Subcutaneous TID WC Theressa Millard, MD   1 Units at 05/26/14 1847  . insulin glargine (LANTUS) injection 6 Units  6 Units Subcutaneous Daily Theressa Millard, MD   6 Units at 05/27/14 [phone]  . levothyroxine (SYNTHROID, LEVOTHROID) tablet 50 mcg  50 mcg Oral QAC breakfast Theressa Millard, MD   50 mcg at 05/27/14 [phone]  . loratadine (CLARITIN) tablet 10 mg  10 mg Oral Daily Theressa Millard, MD   10 mg at 05/27/14 0953  . metoprolol tartrate (LOPRESSOR)  tablet 25 mg  25 mg Oral BID Theressa Millard, MD   25 mg at 05/27/14 0953  . morphine 2 MG/ML injection 1-2 mg  1-2 mg Intravenous Q4H PRN Theressa Millard, MD      . ondansetron (ZOFRAN) tablet 4 mg  4 mg Oral Q6H PRN Theressa Millard, MD       Or  . ondansetron (ZOFRAN) injection 4 mg  4 mg Intravenous Q6H PRN Theressa Millard, MD      . pantoprazole (PROTONIX) EC tablet 40 mg  40 mg Oral Daily Theressa Millard, MD   40 mg at 05/27/14 0953  . potassium chloride (K-DUR,KLOR-CON) CR tablet 10 mEq  10 mEq Oral Daily Theressa Millard, MD   10 mEq at 05/27/14 0953  . ranolazine ([name]) 12 hr tablet 500 mg  500 mg Oral Daily Theressa Millard, MD   500 mg at 05/27/14 0953  . rosuvastatin (CRESTOR) tablet 10 mg  10 mg Oral Q M,W,F-1800 Theressa Millard, MD   10 mg at 05/25/14 1727  . sodium chloride 0.9 % injection 3 mL  3 mL Intravenous Q12H Theressa Millard, MD   3 mL at 05/27/14 0954  . sodium chloride 0.9 % injection 3 mL  3 mL Intravenous PRN Theressa Millard, MD      . tobramycin (TOBREX) 0.3 % ophthalmic solution 1 drop  1 drop Right Eye Daily Theressa Millard, MD   1 drop at 05/27/14 0954  . Warfarin - Pharmacist Dosing Inpatient   Does not apply q1800 Theressa Millard, MD      . zolpidem [facility]) tablet 5 mg  5 mg Oral QHS PRN Theressa Millard, MD   5 mg at 05/26/14 2258   Filed Weights   05/25/14 0613 05/26/14 0500 05/27/14 0446  Weight: 83.912 kg (184 lb 15.9 oz) 84.641 kg (186 lb 9.6 oz) 82.192 kg (181 lb 3.2 oz)     PE: General appearance: alert, cooperative and no distress,  Chronically ill appearing, elderly  Neck: no carotid bruit and no JVD Lungs: decreased BS at the bases Heart: regular rate and rhythm Extremities: no LEE Pulses: 2+ and symmetric Skin: warm and dry Neurologic: Grossly normal  Lab Results:   Recent Labs  05/25/14 0525 05/26/14 0443  WBC 12.8* 8.6  HGB 10.3* 11.0*  HCT 32.4* 34.7*  PLT 230 228   BMET  Recent Labs   05/25/14 0525 05/26/14 0443 05/27/14 0545  NA 133* 134* 137  K 4.5 4.8 4.4  CL 98* 98* 99*  CO2 27 26 26   GLUCOSE 108* 111* 88  BUN 21* 24* 24*  CREATININE 1.64* 1.46* 1.32*  CALCIUM 8.7* 9.0 9.3  PT/INR  Recent Labs  05/25/14 0525 05/26/14 0443 05/27/14 0829  LABPROT 30.3* 26.5* 21.0*  INR 2.86* 2.42* 1.80*   Cardiac Panel (last 3 results)  Recent Labs  05/24/14 1714 05/24/14 2249 05/25/14 0525  TROPONINI 0.07* 0.03 0.11*      Assessment/Plan  Principal Problem:   Acute on chronic diastolic heart failure Active Problems:   Hypertension   Long term current use of anticoagulant therapy   Diabetes mellitus type 2, uncontrolled   Hypothyroidism   A-fib   Abdominal pain, left lower quadrant   CHF exacerbation   SOB (shortness of breath)   UTI (urinary tract infection)   Systolic and diastolic CHF, acute on chronic   Acute respiratory failure with hypoxia   Acute kidney injury    1. Acute on chronic combined systolic/diastolic CHF: breathing has improved. I/Os not accurate as UOP not recorded, but patient reports good UOP. No LEE. Decreased BS at the bases. Continue PO Lasix, BB and low sodium diet.   2. Ischemic DCM:  with moderate to severe LV dysfunction (EF not assess on last echo 02/05/2014). Continue BB therapy. No ACE/ARB due to CKD.   3. CAD: She has had some burning in her chest that may be angina vs. GERD.  Advised her to let her nurse know when she has this so that she can get mylanta or NTG  Has known CAD and had high risk NST in the fall, but given advanced age, CKD, poor memory and poor baseline functional status, patient elected not to pursue cath. No plans for any invasive w/u this admit. Continue medical therapy: ASA, statin, BB and [name].    4. HTN: well controlled on current regimen at 127/57  5. T2DM: management per primary  6. CKD: tolerating higher dose of PO Lasix with stable renal function.SCR improved since yesterday from  1.64-->1.46. Will need f/u BMP as an OP.   7. PAF: Has PPM. Paced rhythm on tele. Rate is controlled. On coumadin for a/c with therapeutic INR at 2.42.   8. Elevated Troponin: minimaly elevated at 0.07-->0.03-->0.11. Has known CAD and had high risk NST in the fall, but given advanced age, CKD, poor memory and poor baseline functional status, patient elected not to pursue cath. No plans for any invasive w/u this admit. Continue medical therapy.     Nahser, Wonda Cheng, MD  05/27/2014 10:36 AM    [address] [place],  [place], [place]  [address] Pager [phone] Phone: [phone]; Fax: [phone]   [facility]  [address] [place], [place]  [address] [phone]    Fax [phone]

## 2014-05-27 NOTE — Progress Notes (Signed)
PROGRESS NOTE    [name] BDZ:[id number] DOB: 05-06-1920 DOA: 05/24/2014 PCP: Kevan Ny, MD  Primary Cardiologist: Dr. Sanda Klein  HPI/Brief narrative: according to prior PN 81 y.o. female Chronic systolic CHF, A.Fib on Coumadin Rx, CAD S/P CABG, S/P Pacemaker, DM2 and HTN who presents to the ED with complaints of worsening SOB since 8 pm. She has intermittent Chest pain chronically and is on [name] Rx. She had a recent URI and was treated with outpatient Azithromycin. She complains of DOE, and Orthopnea. She was evaluated in the ED and was found to have hypoxia and was placed on supplemental Oxygen when her O2 sats decreased into the mid 80'S. She was found to have an exacerbation of CHF by Chest X-ray and BNP and then she was administered IV Lasix x 1 dose and referred for admission.   Assessment/Plan:  Acute on chronic systolic and diastolic CHF/moderate to severe ischemic cardiomyopathy - 2-D echo 02/05/14: LVEF at least moderate-severely depressed with wall motion abnormalities. Grade 1 diastolic dysfunction. - Suspected precipitating factor: Recent URI versus dietary indiscretion (likes to eat salted cheese balls) - Cardiology on board and assisting with management.  Acute respiratory failure with hypoxia - Secondary to decompensated CHF - Treat underlying cause and wean off oxygen as tolerated  Paroxysmal atrial fibrillation - Controlled ventricular rate - Continue metoprolol and Coumadin per pharmacy - Anticoagulated, slightly decreased INR below goal  Complete heart block, status post permanent pacemaker  CAD status post CABG/chronic intermittent chest pain - Continue medical management: Aspirin, beta blockers, [name] and statins - Mildly elevated troponin likely demand ischemia from decompensated CHF and AKI. Flat trend. 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  Disposition Plan: DC home when medically stable   Consultants:  Cardiology  Procedures:  None  Antibiotics:  None   Subjective: Denied cough or dyspnea. No chest pain reported. As per daughter at bedside, no acute events.  Objective: Filed Vitals:   05/26/14 2051 05/27/14 0446 05/27/14 0949 05/27/14 1300  BP: 115/49 131/49 147/59 111/45  Pulse: 67 61 60 61  Temp: 98.1 F (36.7 C) 97.8 F (36.6 C)  97.7 F (36.5 C)  TempSrc: Oral Oral  Oral  Resp: 18 18 18 18   Height:      Weight:  82.192 kg (181 lb 3.2 oz)    SpO2: 100% 95% 100% 95%    Intake/Output Summary (Last 24 hours) at 05/27/14 1729 Last data filed at 05/27/14 1300  Gross per 24 hour  Intake   1640 ml  Output      0 ml  Net   1640 ml   Filed Weights   05/25/14 0613 05/26/14 0500 05/27/14 0446  Weight: 83.912 kg (184 lb 15.9 oz) 84.641 kg (186 lb 9.6 oz) 82.192 kg (181 lb 3.2 oz)     Exam:  General: Alert, awake,  in no acute distress. HEENT: No bruits, no  goiter. Heart: S1 and S2 wnl, without murmurs, rubs, gallops. Lungs: Clear to auscultation bilaterally, no wheezes Abdomen: Soft, nontender, nondistended, positive bowel sounds. Extremities: No clubbing cyanosis or edema with positive pedal pulses. Neuro: Grossly intact, nonfocal.   Data Reviewed: Basic Metabolic Panel:  Recent Labs Lab 05/24/14 0145 05/25/14 0525 05/26/14 0443 05/27/14 0545  NA 134* 133* 134* 137  K 4.5 4.5 4.8 4.4  CL  99* 98* 98* 99*  CO2 25 27 26 26   GLUCOSE 204* 108* 111* 88  BUN 11 21* 24* 24*  CREATININE 1.01* 1.64* 1.46* 1.32*  CALCIUM 9.0 8.7* 9.0 9.3   Liver Function Tests: No results for input(s): AST, ALT, ALKPHOS, BILITOT, PROT, ALBUMIN in the last 168 hours. No results for input(s): LIPASE, AMYLASE in the last 168 hours. No results for input(s): AMMONIA in the last 168 hours. CBC:  Recent Labs Lab 05/24/14 0145 05/25/14 0525 05/26/14 0443  WBC 17.9* 12.8* 8.6  NEUTROABS 16.4*  --   --   HGB 12.5 10.3* 11.0*  HCT 39.8 32.4* 34.7*  MCV 90.9 89.0 88.3  PLT 266 230 228   Cardiac Enzymes:  Recent Labs Lab 05/24/14 1714 05/24/14 2249 05/25/14 0525  TROPONINI 0.07* 0.03 0.11*   BNP (last 3 results) No results for input(s): PROBNP in the last 8760 hours. CBG:  Recent Labs Lab 05/26/14 1557 05/26/14 2038 05/27/14 0645 05/27/14 1120 05/27/14 1635  GLUCAP 135* 152* 94 122* 114*    No results found for this or any previous visit (from the past 240 hour(s)).       Studies: No results found.      Scheduled Meds: . aspirin  81 mg Oral Daily  . atropine  1 drop Right Eye QID  . donepezil  5 mg Oral QHS  . feeding supplement (GLUCERNA SHAKE)  237 mL Oral BID BM  . folic acid  1 mg Oral Daily  . furosemide  40 mg Oral Daily  . gabapentin  100 mg Oral BID  . insulin aspart  0-5 Units Subcutaneous QHS  . insulin aspart  0-9 Units Subcutaneous TID WC  . insulin glargine  6 Units Subcutaneous Daily  . levothyroxine  50 mcg Oral QAC breakfast  . loratadine  10 mg Oral Daily  . metoprolol tartrate  25 mg Oral BID  . pantoprazole  40 mg Oral Daily  . potassium chloride  10 mEq Oral Daily  . ranolazine  500 mg Oral Daily  . rosuvastatin  10 mg Oral Q M,W,F-1800  . sodium chloride  3 mL Intravenous Q12H  . tobramycin  1 drop Right Eye Daily  . Warfarin - Pharmacist Dosing Inpatient   Does not apply q1800   Continuous Infusions:   Principal Problem:   Acute on  chronic diastolic heart failure Active Problems:   Hypertension   Long term current use of anticoagulant therapy   Diabetes mellitus type 2, uncontrolled   Hypothyroidism   A-fib   Abdominal pain, left lower quadrant   CHF exacerbation   SOB (shortness of breath)   UTI (urinary tract infection)   Systolic and diastolic CHF, acute on chronic   Acute respiratory failure with hypoxia   Acute kidney injury   Acute diastolic CHF (congestive heart failure)    Time spent: 30 mins    Trajon Rosete, Linward Foster, MD, FACP, [facility]. [facility] Pager [phone]  If 7PM-7AM, please contact night-coverage [email or web address] Password [email or web address] 05/27/2014, 5:29 PM    LOS: 3 days

## 2014-05-27 NOTE — Care Management Note (Signed)
Case Management Note  Patient Details  Name: [name] MRN: [id number] Date of Birth: 03-06-1920  Subjective/Objective:        Met with pt and son to discuss dc plans.  Pt lives with family members who rotate to provide her 24h care.  She is active with [facility] for [facility] [place], and [place] aide.              Action/Plan: Will need resumption of care orders prior to dc for [facility] services to continue at dc.  Please address.    Expected Discharge Date:                  Expected Discharge Plan:  [place]  In-House Referral:     Discharge planning Services  CM Consult  Post Acute Care Choice:    Choice offered to:     DME Arranged:    DME Agency:     HH Arranged:    [place] Agency:     Status of Service:  In process, will continue to follow  Medicare Important Message Given:  Yes Date Medicare IM Given:  05/27/14 Medicare IM give by:  Ellan Lambert, RN, BSN  Date Additional Medicare IM Given:    Additional Medicare Important Message give by:     If discussed at [place] of Stay Meetings, dates discussed:    Additional Comments:  Ella Bodo, RN 05/27/2014, 5:30 PM

## 2014-05-28 ENCOUNTER — Telehealth: Payer: Self-pay | Admitting: Nurse Practitioner

## 2014-05-28 DIAGNOSIS — R7989 Other specified abnormal findings of blood chemistry: Secondary | ICD-10-CM | POA: Diagnosis not present

## 2014-05-28 DIAGNOSIS — N183 Chronic kidney disease, stage 3 unspecified: Secondary | ICD-10-CM

## 2014-05-28 DIAGNOSIS — I5033 Acute on chronic diastolic (congestive) heart failure: Secondary | ICD-10-CM | POA: Diagnosis not present

## 2014-05-28 DIAGNOSIS — I5043 Acute on chronic combined systolic (congestive) and diastolic (congestive) heart failure: Secondary | ICD-10-CM | POA: Diagnosis not present

## 2014-05-28 DIAGNOSIS — E1165 Type 2 diabetes mellitus with hyperglycemia: Secondary | ICD-10-CM | POA: Diagnosis not present

## 2014-05-28 LAB — BASIC METABOLIC PANEL
Anion gap: 8 (ref 5–15)
BUN: 23 mg/dL — AB (ref 6–20)
CHLORIDE: 101 mmol/L (ref 101–111)
CO2: 28 mmol/L (ref 22–32)
CREATININE: 1.38 mg/dL — AB (ref 0.44–1.00)
Calcium: 9.4 mg/dL (ref 8.9–10.3)
GFR calc non Af Amer: 32 mL/min — ABNORMAL LOW (ref >60)
GFR, EST AFRICAN AMERICAN: 37 mL/min — AB (ref >60)
GLUCOSE: 89 mg/dL (ref 65–99)
Potassium: 4.3 mmol/L (ref 3.5–5.1)
Sodium: 137 mmol/L (ref 135–145)

## 2014-05-28 LAB — GLUCOSE, CAPILLARY
Glucose-Capillary: 122 mg/dL — ABNORMAL HIGH (ref 65–99)
Glucose-Capillary: 94 mg/dL (ref 65–99)
Glucose-Capillary: 111 mg/dL — ABNORMAL HIGH (ref 65–99)
Glucose-Capillary: 155 mg/dL — ABNORMAL HIGH (ref 65–99)
Glucose-Capillary: 165 mg/dL — ABNORMAL HIGH (ref 65–99)

## 2014-05-28 LAB — PROTIME-INR
INR: 1.73 — ABNORMAL HIGH (ref 0.00–1.49)
Prothrombin Time: 20.4 seconds — ABNORMAL HIGH (ref 11.6–15.2)

## 2014-05-28 MED ORDER — WARFARIN SODIUM 4 MG PO TABS
4.0000 mg | ORAL_TABLET | Freq: Once | ORAL | Status: AC
  Administered 2014-05-28: 4 mg via ORAL
  Filled 2014-05-28: qty 1

## 2014-05-28 NOTE — Progress Notes (Addendum)
Patient: [name] / Admit Date: 05/24/2014 / Date of Encounter: 05/28/2014, 9:54 AM   Subjective: Feeling much better. No CP. SOB resolved. Laying 15 degrees flat in bed. She thinks she could teach the hospital how to cook better food.   Objective: Telemetry: NSR with occasional V pacing, occasional AV pacing, occasional PVCs Physical Exam: Blood pressure 134/50, pulse 58, temperature 98.3 F (36.8 C), temperature source Oral, resp. rate 18, height 5\' 4"  (1.626 m), weight 180 lb 1.6 oz (81.693 kg), SpO2 97 %. General: Well developed, well nourished AAF, in no acute distress. Head: Normocephalic, atraumatic, sclera non-icteric, no xanthomas, nares are without discharge. Neck: Negative for carotid bruits. JVP not elevated. Lungs: Clear bilaterally to auscultation without wheezes, rales, or rhonchi. Breathing is unlabored. Heart: RRR S1 S2 without murmurs, rubs, or gallops.  Abdomen: Soft, non-tender, non-distended with normoactive bowel sounds. No rebound/guarding. Extremities: No clubbing or cyanosis. No edema. Distal pedal pulses are 2+ and equal bilaterally. Neuro: Alert and oriented X 3. Moves all extremities spontaneously. Psych:  Responds to questions appropriately with a normal affect.   Intake/Output Summary (Last 24 hours) at 05/28/14 0954 Last data filed at 05/28/14 0600  Gross per 24 hour  Intake    600 ml  Output      0 ml  Net    600 ml    Inpatient Medications:  . aspirin  81 mg Oral Daily  . atropine  1 drop Right Eye QID  . donepezil  5 mg Oral QHS  . feeding supplement (GLUCERNA SHAKE)  237 mL Oral BID BM  . folic acid  1 mg Oral Daily  . furosemide  40 mg Oral Daily  . gabapentin  100 mg Oral BID  . insulin aspart  0-5 Units Subcutaneous QHS  . insulin aspart  0-9 Units Subcutaneous TID WC  . insulin glargine  6 Units Subcutaneous Daily  . levothyroxine  50 mcg Oral QAC breakfast  . loratadine  10 mg Oral Daily  . metoprolol tartrate  25 mg Oral  BID  . pantoprazole  40 mg Oral Daily  . potassium chloride  10 mEq Oral Daily  . ranolazine  500 mg Oral Daily  . rosuvastatin  10 mg Oral Q M,W,F-1800  . sodium chloride  3 mL Intravenous Q12H  . tobramycin  1 drop Right Eye Daily  . Warfarin - Pharmacist Dosing Inpatient   Does not apply q1800   Infusions:    Labs:  Recent Labs  05/27/14 0545 05/28/14 0438  NA 137 137  K 4.4 4.3  CL 99* 101  CO2 26 28  GLUCOSE 88 89  BUN 24* 23*  CREATININE 1.32* 1.38*  CALCIUM 9.3 9.4   No results for input(s): AST, ALT, ALKPHOS, BILITOT, PROT, ALBUMIN in the last 72 hours.  Recent Labs  05/26/14 0443  WBC 8.6  HGB 11.0*  HCT 34.7*  MCV 88.3  PLT 228    Radiology/Studies:  Dg Chest Port 1 View  05/24/2014   CLINICAL DATA:  Dyspnea, onset tonight.  EXAM: PORTABLE CHEST - 1 VIEW  COMPARISON:  02/04/2014  FINDINGS: There is prior sternotomy and CABG. There are intact appearances of the transvenous leads. There are moderate vascular and interstitial congestive changes. No dense airspace consolidation is evident. No large effusion is evident. There is mild unchanged right hemidiaphragm elevation.  IMPRESSION: Mild congestive heart failure   Electronically Signed   By: Andreas Newport M.D.   On: 05/24/2014 02:06  Assessment and Plan  56F wheelchair-bound with CAD s/p CABG, PPM for high-grade AV block, PAF, recennt UEDVT a/w venous catheter, and recurrent anemia/GIB, on Coumadin, CKD III-IV, last nuc 08/2013 was high risk but pateint did not wish to undergo cath. Last echo 01/2014 with moderate-severely depressed LV function, not quantified (previously 40-45% in 02/2013). Admitted 5/8 with a/c combined CHF, mildly elevated troponin.  1. Acute on chronic combined systolic/diastolic CHF/ischemic cardiomyopathy with associated acute respiratory failure with hypoxia - 2-D echo 02/05/14: LVEF at least moderate-severely depressed with wall motion abnormalities. Grade 1 diastolic dysfunction.  Suspect precipitated by recent URI or dietary indiscretion (eating cheese balls). Weight -6lb. UOP not being recorded because of incontinence. She appears euvolemic to me on present regimen. Her daughter says they are keeping her inpatient to adjust her coumadin. I think she is nearing d/c. I tentatively scheduled a f/u on 06/05/14 at 10am. Await pharmacy recs regarding Coumadin - next follow-up isn't until 5/31 and this may need to be moved up to her 5/20 appointment (just let me know if patient goes home).  2. CAD s/p CABG 1994, with elevated troponin this admission - minimaly elevated at 0.07-->0.03-->0.11. Has known CAD and had high risk NST in the fall, but given advanced age, CKD, poor memory and poor baseline functional status, patient elected not to pursue cath. No plans for any invasive w/u this admit. Continue medical therapy.   3. HTN - normotensive. Continue current regimen.  4. CKD stage III-IV - Cr stable and actually improved with diuresis.  5. PAF with h/o high-grade block s/p PPM - rate controlled, paced on tele. On Coumadin per pharmacy.  6. Diabetes mellitus, uncontrolled, A1C 7.3 - per IM.  7. Abnormal TSH - adjustment of medication per IM.  SignedMelina Copa PA-C Pager: [phone]   Attending Note:   The patient was seen and examined.  Agree with assessment and plan as noted above.  Changes made to the above note as needed.  Pt appears very stable.  No evidence of volume overload.  OK  For DC from our standpoint. See pharmacy recs for specific coumadin dosing recommendations.   Follow up with Dr. Sallyanne Kuster.  Will sign off. Call for questions    Ramond Dial., MD, [facility] 05/28/2014, 11:55 AM [address],  [place] Pager [phone]

## 2014-05-28 NOTE — Progress Notes (Signed)
PROGRESS NOTE    [name] KTG:[id number] DOB: May 06, 1920 DOA: 05/24/2014 PCP: Kevan Ny, MD  Primary Cardiologist: Dr. Sanda Klein  HPI/Brief narrative: according to prior PN 81 y.o. female Chronic systolic CHF, A.Fib on Coumadin Rx, CAD S/P CABG, S/P Pacemaker, DM2 and HTN who presents to the ED with complaints of worsening SOB since 8 pm. She has intermittent Chest pain chronically and is on [name] Rx. She had a recent URI and was treated with outpatient Azithromycin. She complains of DOE, and Orthopnea. She was evaluated in the ED and was found to have hypoxia and was placed on supplemental Oxygen when her O2 sats decreased into the mid 80'S. She was found to have an exacerbation of CHF by Chest X-ray and BNP and then she was administered IV Lasix x 1 dose and referred for admission.   Assessment/Plan:  Acute on chronic systolic and diastolic CHF/moderate to severe ischemic cardiomyopathy - 2-D echo 02/05/14: LVEF at least moderate-severely depressed with wall motion abnormalities. Grade 1 diastolic dysfunction. - Suspected precipitating factor: Recent URI versus dietary indiscretion (likes to eat salted cheese balls) - Cardiology on board while patient was in house. Signed off today will d/c on medications they have recommended.  Acute respiratory failure with hypoxia - Secondary to decompensated CHF  Paroxysmal atrial fibrillation - Controlled ventricular rate - Continue metoprolol and Coumadin per pharmacy - Anticoagulated, INR below goal. Reassess next am.  Complete heart block, status post permanent pacemaker  CAD status post CABG/chronic intermittent chest pain - Continue medical management: Aspirin, beta blockers, [name] and statins - Mildly elevated troponin likely demand ischemia from decompensated CHF and AKI. Flat trend. 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  Disposition Plan: With improvement in INR levels closer to target range 2-3   Consultants:  Cardiology  Procedures:  None  Antibiotics:  None   Subjective: No new complaints.  Objective: Filed Vitals:   05/27/14 1936 05/28/14 0502 05/28/14 1000 05/28/14 1352  BP: 115/49 134/50 136/54 103/41  Pulse: 62 58 56 66  Temp: 98 F (36.7 C) 98.3 F (36.8 C)  98.2 F (36.8 C)  TempSrc: Oral Oral  Oral  Resp: 17 18 18 18   Height:      Weight:  81.693 kg (180 lb 1.6 oz)    SpO2: 96% 97% 98% 98%    Intake/Output Summary (Last 24 hours) at 05/28/14 1730 Last data filed at 05/28/14 1300  Gross per 24 hour  Intake   1260 ml  Output      0 ml  Net   1260 ml   Filed Weights   05/26/14 0500 05/27/14 0446 05/28/14 0502  Weight: 84.641 kg (186 lb 9.6 oz) 82.192 kg (181 lb 3.2 oz) 81.693 kg (180 lb 1.6 oz)     Exam:  General: Alert, awake,  in no acute distress. HEENT: No bruits, no goiter.  Heart: S1 and S2 wnl, without murmurs, rubs, gallops. Lungs: Clear to auscultation bilaterally, no wheezes Abdomen: Soft, nontender, nondistended, positive bowel sounds. Extremities: No clubbing cyanosis or edema with positive pedal pulses. Neuro: Grossly intact, nonfocal.   Data Reviewed: Basic Metabolic Panel:  Recent Labs Lab 05/24/14 0145 05/25/14 0525 05/26/14 0443 05/27/14 0545 05/28/14 0438  NA 134* 133* 134* 137 137  K 4.5 4.5 4.8 4.4  4.3  CL 99* 98* 98* 99* 101  CO2 25 27 26 26 28   GLUCOSE 204* 108* 111* 88 89  BUN 11 21* 24* 24* 23*  CREATININE 1.01* 1.64* 1.46* 1.32* 1.38*  CALCIUM 9.0 8.7* 9.0 9.3 9.4   Liver Function Tests: No results for input(s): AST, ALT, ALKPHOS, BILITOT, PROT, ALBUMIN in the last 168 hours. No results for input(s): LIPASE, AMYLASE in the last 168 hours. No results for input(s): AMMONIA in the last 168 hours. CBC:  Recent Labs Lab 05/24/14 0145 05/25/14 0525 05/26/14 0443  WBC 17.9* 12.8* 8.6  NEUTROABS 16.4*  --   --   HGB 12.5 10.3* 11.0*  HCT 39.8 32.4* 34.7*  MCV 90.9 89.0 88.3  PLT 266 230 228   Cardiac Enzymes:  Recent Labs Lab 05/24/14 1714 05/24/14 2249 05/25/14 0525  TROPONINI 0.07* 0.03 0.11*   BNP (last 3 results) No results for input(s): PROBNP in the last 8760 hours. CBG:  Recent Labs Lab 05/27/14 1635 05/27/14 2124 05/28/14 0600 05/28/14 1124 05/28/14 1620  GLUCAP 114* 122* 94 111* 155*    No results found for this or any previous visit (from the past 240 hour(s)).       Studies: No results found.      Scheduled Meds: . aspirin  81 mg Oral Daily  . atropine  1 drop Right Eye QID  . donepezil  5 mg Oral QHS  . feeding supplement (GLUCERNA SHAKE)  237 mL Oral BID BM  . folic acid  1 mg Oral Daily  . furosemide  40 mg Oral Daily  . gabapentin  100 mg Oral BID  . insulin aspart  0-5 Units Subcutaneous QHS  . insulin aspart  0-9 Units Subcutaneous TID WC  . insulin glargine  6 Units Subcutaneous Daily  . levothyroxine  50 mcg Oral QAC breakfast  . loratadine  10 mg Oral Daily  . metoprolol tartrate  25 mg Oral BID  . pantoprazole  40 mg Oral Daily  . potassium chloride  10 mEq Oral Daily  . ranolazine  500 mg Oral Daily  . rosuvastatin  10 mg Oral Q M,W,F-1800  . sodium chloride  3 mL Intravenous Q12H  . warfarin  4 mg Oral ONCE-1800  . Warfarin - Pharmacist Dosing Inpatient   Does not apply q1800   Continuous Infusions:    Active Problems:   Coronary artery disease, CABG in 1994. Myoview abnormal Aug 2015- medical Rx   Hypertension   Pacemaker   Long term current use of anticoagulant therapy   Diabetes mellitus type 2, uncontrolled   Hypothyroidism   PAF (paroxysmal atrial fibrillation)   Abdominal pain, left lower quadrant   SOB (shortness of breath)   UTI (urinary tract infection)   Acute on chronic combined systolic and diastolic CHF (congestive heart failure)   Acute respiratory failure with hypoxia   Acute kidney injury   Abnormal TSH   CKD (chronic kidney disease), stage III    Time spent: 30 mins    Chanc Kervin, Linward Foster, MD, FACP, [facility]. [facility] Pager [phone]  If 7PM-7AM, please contact night-coverage [email or web address] Password [email or web address] 05/28/2014, 5:30 PM    LOS: 4 days

## 2014-05-28 NOTE — Progress Notes (Signed)
ANTICOAGULATION CONSULT NOTE - Follow Up Consult  Pharmacy Consult for Coumadin Indication: atrial fibrillation  Allergies  Allergen Reactions  . Darvon     Unknown reaction   . Digoxin And Related Nausea Only  . Penicillins Other (See Comments)    Was told had allergy from childhood... Unknown reaction  . Percocet [Oxycodone-Acetaminophen] Other (See Comments)    confused  . Percodan [Oxycodone-Aspirin]   . Vicodin [Hydrocodone-Acetaminophen] Other (See Comments)    confused    Patient Measurements: Height: 5\' 4"  (162.6 cm) Weight: 180 lb 1.6 oz (81.693 kg) IBW/kg (Calculated) : 54.7   Vital Signs: Temp: 98.3 F (36.8 C) (05/12 0502) Temp Source: Oral (05/12 0502) BP: 136/54 mmHg (05/12 1000) Pulse Rate: 56 (05/12 1000)  Labs:  Recent Labs  05/26/14 0443 05/27/14 0545 05/27/14 0829 05/28/14 0438  HGB 11.0*  --   --   --   HCT 34.7*  --   --   --   PLT 228  --   --   --   LABPROT 26.5*  --  21.0* 20.4*  INR 2.42*  --  1.80* 1.73*  CREATININE 1.46* 1.32*  --  1.38*    Estimated Creatinine Clearance: 27.5 mL/min (by C-G formula based on Cr of 1.38).   Medications:  Scheduled:  . aspirin  81 mg Oral Daily  . atropine  1 drop Right Eye QID  . donepezil  5 mg Oral QHS  . feeding supplement (GLUCERNA SHAKE)  237 mL Oral BID BM  . folic acid  1 mg Oral Daily  . furosemide  40 mg Oral Daily  . gabapentin  100 mg Oral BID  . insulin aspart  0-5 Units Subcutaneous QHS  . insulin aspart  0-9 Units Subcutaneous TID WC  . insulin glargine  6 Units Subcutaneous Daily  . levothyroxine  50 mcg Oral QAC breakfast  . loratadine  10 mg Oral Daily  . metoprolol tartrate  25 mg Oral BID  . pantoprazole  40 mg Oral Daily  . potassium chloride  10 mEq Oral Daily  . ranolazine  500 mg Oral Daily  . rosuvastatin  10 mg Oral Q M,W,F-1800  . sodium chloride  3 mL Intravenous Q12H  . Warfarin - Pharmacist Dosing Inpatient   Does not apply q1800    Assessment: Patient is  a 81 year old female admitted for heart failure.  INR = 1.73 today (on Coumadin for Afib) No bleeding noted  Goal of Therapy:  INR 2-3 Monitor platelets by anticoagulation protocol: Yes   Plan:  Coumadin 4 mg po x 1 dose tonight Then back to home dose 3.75 mg daily with 2.5 mg on Sunday and Wednesday Recommend INR check early next week -- Monday or Tuesday  Thank you. Anette Guarneri, PharmD [phone]  05/28/2014,10:23 AM

## 2014-05-28 NOTE — Telephone Encounter (Signed)
New message       TCM appt on 06-05-14 per [facility]

## 2014-05-29 DIAGNOSIS — I10 Essential (primary) hypertension: Secondary | ICD-10-CM | POA: Diagnosis not present

## 2014-05-29 DIAGNOSIS — E1165 Type 2 diabetes mellitus with hyperglycemia: Secondary | ICD-10-CM | POA: Diagnosis not present

## 2014-05-29 DIAGNOSIS — N183 Chronic kidney disease, stage 3 (moderate): Secondary | ICD-10-CM | POA: Diagnosis not present

## 2014-05-29 DIAGNOSIS — R7989 Other specified abnormal findings of blood chemistry: Secondary | ICD-10-CM | POA: Diagnosis not present

## 2014-05-29 LAB — PROTIME-INR
INR: 1.69 — ABNORMAL HIGH (ref 0.00–1.49)
Prothrombin Time: 20 seconds — ABNORMAL HIGH (ref 11.6–15.2)

## 2014-05-29 LAB — GLUCOSE, CAPILLARY
GLUCOSE-CAPILLARY: 105 mg/dL — AB (ref 65–99)
Glucose-Capillary: 105 mg/dL — ABNORMAL HIGH (ref 65–99)

## 2014-05-29 MED ORDER — FUROSEMIDE 40 MG PO TABS: 40.0000 mg | ORAL_TABLET | Freq: Every day | ORAL | Status: DC

## 2014-05-29 MED ORDER — POTASSIUM CHLORIDE CRYS ER 10 MEQ PO TBCR: 10.0000 meq | EXTENDED_RELEASE_TABLET | Freq: Every day | ORAL | Status: DC

## 2014-05-29 MED ORDER — WARFARIN SODIUM 6 MG PO TABS
6.0000 mg | ORAL_TABLET | Freq: Once | ORAL | Status: DC
  Filled 2014-05-29: qty 1

## 2014-05-29 MED ORDER — INSULIN GLARGINE 100 UNIT/ML SOLOSTAR PEN: 6.0000 [IU] | PEN_INJECTOR | Freq: Every morning | SUBCUTANEOUS | Status: DC

## 2014-05-29 NOTE — Progress Notes (Signed)
ANTICOAGULATION CONSULT NOTE - Follow Up Consult  Pharmacy Consult for Coumadin Indication: atrial fibrillation  Allergies  Allergen Reactions  . Darvon     Unknown reaction   . Digoxin And Related Nausea Only  . Penicillins Other (See Comments)    Was told had allergy from childhood... Unknown reaction  . Percocet [Oxycodone-Acetaminophen] Other (See Comments)    confused  . Percodan [Oxycodone-Aspirin]   . Vicodin [Hydrocodone-Acetaminophen] Other (See Comments)    confused    Patient Measurements: Height: 5\' 4"  (162.6 cm) Weight: 178 lb 8 oz (80.967 kg) IBW/kg (Calculated) : 54.7   Vital Signs: Temp: 97.6 F (36.4 C) (05/13 0704) Temp Source: Oral (05/13 0146) BP: 122/51 mmHg (05/13 0704) Pulse Rate: 66 (05/13 0704)  Labs:  Recent Labs  05/27/14 0545 05/27/14 0829 05/28/14 0438 05/29/14 0349  LABPROT  --  21.0* 20.4* 20.0*  INR  --  1.80* 1.73* 1.69*  CREATININE 1.32*  --  1.38*  --     Estimated Creatinine Clearance: 27.3 mL/min (by C-G formula based on Cr of 1.38).   Medications:  Scheduled:  . aspirin  81 mg Oral Daily  . atropine  1 drop Right Eye QID  . donepezil  5 mg Oral QHS  . feeding supplement (GLUCERNA SHAKE)  237 mL Oral BID BM  . folic acid  1 mg Oral Daily  . furosemide  40 mg Oral Daily  . gabapentin  100 mg Oral BID  . insulin aspart  0-5 Units Subcutaneous QHS  . insulin aspart  0-9 Units Subcutaneous TID WC  . insulin glargine  6 Units Subcutaneous Daily  . levothyroxine  50 mcg Oral QAC breakfast  . loratadine  10 mg Oral Daily  . metoprolol tartrate  25 mg Oral BID  . pantoprazole  40 mg Oral Daily  . potassium chloride  10 mEq Oral Daily  . ranolazine  500 mg Oral Daily  . rosuvastatin  10 mg Oral Q M,W,F-1800  . sodium chloride  3 mL Intravenous Q12H  . Warfarin - Pharmacist Dosing Inpatient   Does not apply q1800    Assessment: Patient is a 81 year old female admitted for heart failure.  INR = 1.69 today (on Coumadin  for Afib) No bleeding noted  Goal of Therapy:  INR 2-3 Monitor platelets by anticoagulation protocol: Yes   Plan:  Coumadin 6 mg po x 1 dose tonight Then back to home dose 3.75 mg daily with 2.5 mg on Sunday and Wednesday Recommend INR check early next week -- Monday or Tuesday  Thank you. Anette Guarneri, PharmD [phone]  05/29/2014,8:37 AM

## 2014-05-29 NOTE — Telephone Encounter (Signed)
Pt still noted to be admitted in the hospital

## 2014-05-29 NOTE — Progress Notes (Signed)
Pt is being discharged home. Pt has been provided with discharge instructions. RN answered all questions that the patient and family had. Pt is being transported home by her family.

## 2014-05-29 NOTE — Discharge Summary (Signed)
Physician Discharge Summary  [name] ZJQ:[id number] DOB: 1920/08/18 DOA: 05/24/2014  PCP: Kevan Ny, MD  Admit date: 05/24/2014 Discharge date: 05/29/2014  Time spent: > 35 minutes  Recommendations for Outpatient Follow-up:  1. Please reassess INR 5/17 or 5/18 2. Monitor serum creatinine  Discharge Diagnoses:  Active Problems:   Coronary artery disease, CABG in 1994. Myoview abnormal Aug 2015- medical Rx   Hypertension   Pacemaker   Long term current use of anticoagulant therapy   Diabetes mellitus type 2, uncontrolled   Hypothyroidism   PAF (paroxysmal atrial fibrillation)   Abdominal pain, left lower quadrant   SOB (shortness of breath)   UTI (urinary tract infection)   Acute on chronic combined systolic and diastolic CHF (congestive heart failure)   Acute respiratory failure with hypoxia   Acute kidney injury   Abnormal TSH   CKD (chronic kidney disease), stage III   Discharge Condition: stable  Diet recommendation: heart healthy  Filed Weights   05/27/14 0446 05/28/14 0502 05/29/14 0500  Weight: 82.192 kg (181 lb 3.2 oz) 81.693 kg (180 lb 1.6 oz) 80.967 kg (178 lb 8 oz)    History of present illness:  From original HPI: 81 y.o. female Chronic Diastolic CHF, A.Fib on Coumadin Rx, CAD S/P CABG, S/P Pacemaker, DM2 and HTN who presents to the ED with complaints of worsening SOB since 8 pm. She has intermittent Chest pain chronically and is on [name] Rx.   Hospital Course:   Acute on chronic systolic and diastolic CHF/moderate to severe ischemic cardiomyopathy - 2-D echo 02/05/14: LVEF at least moderate-severely depressed with wall motion abnormalities. Grade 1 diastolic dysfunction. - Cardiology on board while patient was in house and recommended the other one:  1. Acute on chronic combined systolic/diastolic CHF/ischemic cardiomyopathy with associated acute respiratory failure with hypoxia - 2-D echo 02/05/14: LVEF at least moderate-severely depressed with  wall motion abnormalities. Grade 1 diastolic dysfunction. Suspect precipitated by recent URI or dietary indiscretion (eating cheese balls). Weight -6lb. UOP not being recorded because of incontinence. She appears euvolemic to me on present regimen. Her daughter says they are keeping her inpatient to adjust her coumadin. I think she is nearing d/c. I tentatively scheduled a f/u on 06/05/14 at 10am. Await pharmacy recs regarding Coumadin - next follow-up isn't until 5/31 and this may need to be moved up to her 5/20 appointment (just let me know if patient goes home).  2. CAD s/p CABG 1994, with elevated troponin this admission - minimaly elevated at 0.07-->0.03-->0.11. Has known CAD and had high risk NST in the fall, but given advanced age, CKD, poor memory and poor baseline functional status, patient elected not to pursue cath. No plans for any invasive w/u this admit. Continue medical therapy.   Acute respiratory failure with hypoxia - Secondary to decompensated CHF - resolved  Paroxysmal atrial fibrillation - Controlled ventricular rate - Continue metoprolol and Coumadin  - Anticoagulated, INR below goal. Please see above.  Complete heart block, status post permanent pacemaker  CAD status post CABG/chronic intermittent chest pain - Continue medical management: Aspirin, beta blockers, [name] and statins - Mildly elevated troponin likely demand ischemia from decompensated CHF and AKI. Flat trend. - Cardiology on board while patient in house. 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  Anemia - Stable  Procedures:  None  Consultations:  Cardiology  Discharge Exam: Filed Vitals:   05/29/14 1336  BP: 106/54  Pulse: 59  Temp: 96.8 F (36 C)  Resp:     General: Pt in nad, alert and awake Cardiovascular: s1 ans s2 wnl, no mrg Respiratory: cta bl, no wheezes  Discharge Instructions   Discharge Instructions    (HEART FAILURE PATIENTS) Call MD:  Anytime you have any of the following symptoms: 1) 3 pound weight gain in 24 hours or 5 pounds in 1 week 2) shortness of breath, with or without a dry hacking cough 3) swelling in the hands, feet or stomach 4) if you have to sleep on extra pillows at night in order to breathe.    Complete by:  As directed      Call MD for:  difficulty breathing, headache or visual disturbances    Complete by:  As directed      Call MD for:  temperature >100.4    Complete by:  As directed      Diet - low sodium heart healthy    Complete by:  As directed      Discharge instructions    Complete by:  As directed   On day of discharge you are to take coumadin 6 mg po x 1. Then go back to you home coumadin regimen.   Recommend reassessing INR early next week (monday or Tuesday) following discharge     Increase activity slowly    Complete by:  As directed           Current Discharge Medication List    CONTINUE these medications which have CHANGED   Details  furosemide (LASIX) 40 MG tablet Take 1 tablet (40 mg total) by mouth daily. Qty: 30 tablet, Refills: 6    Insulin Glargine (LANTUS SOLOSTAR) 100 UNIT/ML Solostar Pen Inject 6 Units into the skin every morning. Qty: 5 pen, Refills: 1    potassium chloride SA (K-DUR,KLOR-CON) 10 MEQ tablet Take 1 tablet (10 mEq total) by mouth daily. Qty: 15 tablet, Refills: 0      CONTINUE these medications which have NOT CHANGED   Details  aspirin 81  MG chewable tablet Chew 81 mg by mouth daily.    atropine 1 % ophthalmic solution Place 1 drop into the right eye 4 (four) times daily.  Refills: 0    donepezil (ARICEPT) 5 MG tablet Take 5 mg by mouth at bedtime.    feeding supplement, GLUCERNA SHAKE, (GLUCERNA SHAKE) LIQD Take 237 mLs by mouth 2 (two) times daily between meals.    fluticasone (FLONASE) 50 MCG/ACT nasal spray Place 2 sprays into both nostrils 2 (two) times daily.    folic acid (FOLVITE) 1 MG tablet Take 1 tablet (1 mg total) by mouth daily. Qty: 90 tablet, Refills: 3    gabapentin (NEURONTIN) 100 MG capsule Take 100 mg by mouth 2 (two) times daily.     glipiZIDE (GLUCOTROL) 5 MG tablet Take one-half tablet by  mouth daily only if blood  sugar is over 200 Qty: 45 tablet, Refills: 1  Hyoscyamine Sulfate 0.375 MG TBCR A1 tab twice a day as needed, abdominal pain Qty: 25 tablet, Refills: 1   Associated Diagnoses: Abdominal pain, left lower quadrant    Lancets (ACCU-CHEK MULTICLIX) lancets Use as instructed to check blood sugar 2 times per day dx code 250.00 Qty: 200 each, Refills: 1    levothyroxine (SYNTHROID, LEVOTHROID) 50 MCG tablet Take 1 tablet (50 mcg total) by mouth daily before breakfast. Qty: 30 tablet, Refills: 5    loratadine (CLARITIN) 10 MG tablet Take 10 mg by mouth daily.    meclizine (ANTIVERT) 25 MG tablet Take 25 mg by mouth 3 (three) times daily as needed for dizziness.    metoprolol tartrate (LOPRESSOR) 25 MG tablet Take 1 tablet (25 mg total) by mouth 2 (two) times daily. Qty: 90 tablet, Refills: 6    nitroGLYCERIN (NITRODUR - DOSED IN MG/24 HR) 0.2 mg/hr patch Place 1 patch (0.2 mg total) onto the skin daily. Qty: 30 patch, Refills: 6    omeprazole (PRILOSEC) 20 MG capsule Take 1 capsule (20 mg total) by mouth daily. Qty: 90 capsule, Refills: 3    Polyvinyl Alcohol-Povidone (REFRESH OP) Place 1 drop into both eyes daily.     ranolazine ([name]) 500 MG 12 hr tablet Take 1 tablet (500  mg total) by mouth daily. Qty: 56 tablet, Refills: 0    rosuvastatin (CRESTOR) 10 MG tablet Take 1 tablet (10 mg total) by mouth 3 (three) times a week. WLN:[id number] Exp: 8-18 Qty: 21 tablet, Refills: 0    senna-docusate (SENOKOT-S) 8.6-50 MG per tablet Take 1 tablet by mouth 2 (two) times daily. Qty: 30 tablet, Refills: 2    tobramycin (TOBREX) 0.3 % ophthalmic solution Place 1 drop into the right eye daily as needed.     traMADol (ULTRAM) 50 MG tablet Take 1 tablet (50 mg total) by mouth every 6 (six) hours as needed for moderate pain. Qty: 30 tablet, Refills: 0    Vitamin D, Ergocalciferol, (DRISDOL) 50000 UNITS CAPS capsule Take 50,000 Units by mouth daily.     warfarin (COUMADIN) 2.5 MG tablet Take 1 to 1.5 tablets by mouth daily as directed by coumadin clinic Qty: 40 tablet, Refills: 2    zolpidem (AMBIEN) 10 MG tablet Take 5 mg by mouth at bedtime as needed. sleep    ACCU-CHEK AVIVA PLUS test strip Use to check blood sugars 2 times daily Qty: 200 each, Refills: 1    Blood Glucose Monitoring Suppl (ACCU-CHEK AVIVA PLUS) W/DEVICE KIT Use to check blood sugar 2 times per day dx code 250.00 Qty: 1 kit, Refills: 0    Lancets Misc. (ACCU-CHEK FASTCLIX LANCET) KIT Use as directec Qty: 1 kit, Refills: 1      STOP taking these medications     azithromycin (ZITHROMAX) 250 MG tablet      guaiFENesin (MUCINEX) 600 MG 12 hr tablet      nitrofurantoin, macrocrystal-monohydrate, (MACROBID) 100 MG capsule      hydrocortisone (ANUSOL-HC) 25 MG suppository        Allergies  Allergen Reactions  . Darvon     Unknown reaction   . Digoxin And Related Nausea Only  . Penicillins Other (See Comments)    Was told had allergy from childhood... Unknown reaction  . Percocet [Oxycodone-Acetaminophen] Other (See Comments)    confused  . Percodan [Oxycodone-Aspirin]   . Vicodin [Hydrocodone-Acetaminophen] Other (See Comments)    confused   Follow-up Information    Follow up with  Murray Hodgkins, NP.  Specialties:  Nurse Practitioner, Cardiology, Radiology   Why:  [place] - 06/05/14 at 10am   Contact information:   [address] [place] [address] [phone]        The results of significant diagnostics from this hospitalization (including imaging, microbiology, ancillary and laboratory) are listed below for reference.    Significant Diagnostic Studies: Dg Chest Port 1 View  05/24/2014   CLINICAL DATA:  Dyspnea, onset tonight.  EXAM: PORTABLE CHEST - 1 VIEW  COMPARISON:  02/04/2014  FINDINGS: There is prior sternotomy and CABG. There are intact appearances of the transvenous leads. There are moderate vascular and interstitial congestive changes. No dense airspace consolidation is evident. No large effusion is evident. There is mild unchanged right hemidiaphragm elevation.  IMPRESSION: Mild congestive heart failure   Electronically Signed   By: Andreas Newport M.D.   On: 05/24/2014 02:06    Microbiology: No results found for this or any previous visit (from the past 240 hour(s)).   Labs: Basic Metabolic Panel:  Recent Labs Lab 05/24/14 0145 05/25/14 0525 05/26/14 0443 05/27/14 0545 05/28/14 0438  NA 134* 133* 134* 137 137  K 4.5 4.5 4.8 4.4 4.3  CL 99* 98* 98* 99* 101  CO2 _0 GLUCOSE 204* 108* 111* 88 89  BUN 11 21* 24* 24* 23*  CREATININE 1.01* 1.64* 1.46* 1.32* 1.38*  CALCIUM 9.0 8.7* 9.0 9.3 9.4   Liver Function Tests: No results for input(s): AST, ALT, ALKPHOS, BILITOT, PROT, ALBUMIN in the last 168 hours. No results for input(s): LIPASE, AMYLASE in the last 168 hours. No results for input(s): AMMONIA in the last 168 hours. CBC:  Recent Labs Lab 05/24/14 0145 05/25/14 0525 05/26/14 0443  WBC 17.9* 12.8* 8.6  NEUTROABS 16.4*  --   --   HGB 12.5 10.3* 11.0*  HCT 39.8 32.4* 34.7*  MCV 90.9 89.0 88.3  PLT 266 230 228   Cardiac Enzymes:  Recent Labs Lab  05/24/14 1714 05/24/14 2249 05/25/14 0525  TROPONINI 0.07* 0.03 0.11*   BNP: BNP (last 3 results)  Recent Labs  05/24/14 0145  BNP 169.5*    ProBNP (last 3 results) No results for input(s): PROBNP in the last 8760 hours.  CBG:  Recent Labs Lab 05/28/14 1124 05/28/14 1620 05/28/14 2228 05/29/14 0634 05/29/14 1122  GLUCAP 111* 155* 165* 105* 105*       Signed:  Velvet Bathe  [facility] 05/29/2014, 3:04 PM

## 2014-06-01 NOTE — Telephone Encounter (Signed)
TCM call: Patient contacted regarding discharge from hospital late Friday afternoon (05/29/14).  Patient understands to follow up with Ignacia Bayley, NP, on Friday, May 20th at 10:00 am at the [facility].  Patient understands discharge instructions to:   (HEART FAILURE PATIENTS) Call MD: Anytime you have any of the following symptoms: 1) 3 pound weight gain in 24 hours or 5 pounds in 1 week 2) shortness of breath, with or without a dry hacking cough 3) swelling in the hands, feet or stomach 4) if you have to sleep on extra pillows at night in order to breathe.  Complete by: As directed       Call MD for: difficulty breathing, headache or visual disturbances  Complete by: As directed      Call MD for: temperature >100.4  Complete by: As directed      Diet - low sodium heart healthy  Complete by: As directed      Discharge instructions  Complete by: As directed   On day of discharge you are to take coumadin 6 mg po x 1. Then go back to you home coumadin regimen.   Recommend reassessing INR early next week (monday or Tuesday) following discharge     Increase activity slowly  Complete by: As directed               Patient understands medications and regiment of Current Discharge Medication List    CONTINUE these medications which have CHANGED   Details  furosemide (LASIX) 40 MG tablet Take 1 tablet (40 mg total) by mouth daily. Qty: 30 tablet, Refills: 6    Insulin Glargine (LANTUS SOLOSTAR) 100 UNIT/ML Solostar Pen Inject 6 Units into the skin every morning. Qty: 5 pen, Refills: 1    potassium chloride SA (K-DUR,KLOR-CON) 10 MEQ tablet Take 1 tablet (10 mEq total) by mouth daily. Qty: 15 tablet, Refills: 0      CONTINUE these medications which have NOT CHANGED   Details  aspirin 81 MG chewable tablet Chew 81 mg by mouth daily.    atropine 1 % ophthalmic solution Place 1 drop into the right eye 4  (four) times daily.  Refills: 0    donepezil (ARICEPT) 5 MG tablet Take 5 mg by mouth at bedtime.    feeding supplement, GLUCERNA SHAKE, (GLUCERNA SHAKE) LIQD Take 237 mLs by mouth 2 (two) times daily between meals.    fluticasone (FLONASE) 50 MCG/ACT nasal spray Place 2 sprays into both nostrils 2 (two) times daily.    folic acid (FOLVITE) 1 MG tablet Take 1 tablet (1 mg total) by mouth daily. Qty: 90 tablet, Refills: 3    gabapentin (NEURONTIN) 100 MG capsule Take 100 mg by mouth 2 (two) times daily.     glipiZIDE (GLUCOTROL) 5 MG tablet Take one-half tablet by mouth daily only if blood sugar is over 200 Qty: 45 tablet, Refills: 1    Hyoscyamine Sulfate 0.375 MG TBCR A1 tab twice a day as needed, abdominal pain Qty: 25 tablet, Refills: 1   Associated Diagnoses: Abdominal pain, left lower quadrant    Lancets (ACCU-CHEK MULTICLIX) lancets Use as instructed to check blood sugar 2 times per day dx code 250.00 Qty: 200 each, Refills: 1    levothyroxine (SYNTHROID, LEVOTHROID) 50 MCG tablet Take 1 tablet (50 mcg total) by mouth daily before breakfast. Qty: 30 tablet, Refills: 5    loratadine (CLARITIN) 10 MG tablet Take 10 mg by mouth daily.    meclizine (ANTIVERT) 25 MG tablet  Take 25 mg by mouth 3 (three) times daily as needed for dizziness.    metoprolol tartrate (LOPRESSOR) 25 MG tablet Take 1 tablet (25 mg total) by mouth 2 (two) times daily. Qty: 90 tablet, Refills: 6    nitroGLYCERIN (NITRODUR - DOSED IN MG/24 HR) 0.2 mg/hr patch Place 1 patch (0.2 mg total) onto the skin daily. Qty: 30 patch, Refills: 6    omeprazole (PRILOSEC) 20 MG capsule Take 1 capsule (20 mg total) by mouth daily. Qty: 90 capsule, Refills: 3    Polyvinyl Alcohol-Povidone (REFRESH OP) Place 1 drop into both eyes daily.     ranolazine ([name]) 500 MG 12 hr tablet Take 1 tablet (500 mg total) by mouth daily. Qty: 56 tablet, Refills: 0     rosuvastatin (CRESTOR) 10 MG tablet Take 1 tablet (10 mg total) by mouth 3 (three) times a week. [id number] Exp: 8-18 Qty: 21 tablet, Refills: 0    senna-docusate (SENOKOT-S) 8.6-50 MG per tablet Take 1 tablet by mouth 2 (two) times daily. Qty: 30 tablet, Refills: 2    tobramycin (TOBREX) 0.3 % ophthalmic solution Place 1 drop into the right eye daily as needed.     traMADol (ULTRAM) 50 MG tablet Take 1 tablet (50 mg total) by mouth every 6 (six) hours as needed for moderate pain. Qty: 30 tablet, Refills: 0    Vitamin D, Ergocalciferol, (DRISDOL) 50000 UNITS CAPS capsule Take 50,000 Units by mouth daily.     warfarin (COUMADIN) 2.5 MG tablet Take 1 to 1.5 tablets by mouth daily as directed by coumadin clinic Qty: 40 tablet, Refills: 2    zolpidem (AMBIEN) 10 MG tablet Take 5 mg by mouth at bedtime as needed. sleep    ACCU-CHEK AVIVA PLUS test strip Use to check blood sugars 2 times daily Qty: 200 each, Refills: 1    Blood Glucose Monitoring Suppl (ACCU-CHEK AVIVA PLUS) W/DEVICE KIT Use to check blood sugar 2 times per day dx code 250.00 Qty: 1 kit, Refills: 0    Lancets Misc. (ACCU-CHEK FASTCLIX LANCET) KIT Use as directec Qty: 1 kit, Refills: 1      STOP taking these medications     azithromycin (ZITHROMAX) 250 MG tablet      guaiFENesin (MUCINEX) 600 MG 12 hr tablet      nitrofurantoin, macrocrystal-monohydrate, (MACROBID) 100 MG capsule      hydrocortisone (ANUSOL-HC) 25 MG suppository              Patient understands to bring all medications to this visit. Patient's son stated that they also give patient 1/2 Ambien tablet each night. They will provide information at appointment so that medication record can be updated.  Patient will contact office should she experience any worsening or new sx prior to appointment on Friday.

## 2014-06-02 ENCOUNTER — Telehealth: Payer: Self-pay | Admitting: Cardiovascular Disease

## 2014-06-02 NOTE — Telephone Encounter (Signed)
Returned call to patient's daughter [name].She stated she did not hold mother on scales 2 days ago.Stated she was not able to stand on scales by herself this morning.Dr.Croitoru advised she can take a extra lasix 40 mg if she is sob.Stated she is not sob.Advised to keep appointment with Ignacia Bayley NP 06/05/14 at 10:00 am [facility] office.

## 2014-06-02 NOTE — Telephone Encounter (Signed)
Returned call to patient's daughter [name].Stated mother recently in hospital.Stated she weighed mother this morning and she has gained 5 lbs in 2 days.Stated she had to hold mother on scales and weight not accurate.No sob.Slight swelling in both feet.Advised to keep post hospital appointment with Ignacia Bayley NP 06/05/14 at 10:00 am at [facility] office.Message sent to Dr.Croitoru.

## 2014-06-02 NOTE — Telephone Encounter (Signed)
Was she holding mom up 2 days ago as well? Or was that weight accurate? If she has no dyspnea, probably best to wait until follow up appt. If she becomes dyspneic, give an extra dose of 40 mg furosemide (that is, take 80 mg instead of 40 mg).

## 2014-06-02 NOTE — Telephone Encounter (Signed)
Pt's daughter called in stating that the pt has a weight gain of about 5 lbs. Two days ago she was 176 and today she is 181. The daughter would like to be advised on what to do. She says that she noticed a little swelling in her legs but no much . Please advise   Thanks

## 2014-06-05 ENCOUNTER — Encounter: Payer: Self-pay | Admitting: Nurse Practitioner

## 2014-06-05 ENCOUNTER — Ambulatory Visit (INDEPENDENT_AMBULATORY_CARE_PROVIDER_SITE_OTHER): Payer: Medicare Other | Admitting: Nurse Practitioner

## 2014-06-05 VITALS — HR 68 | Ht 64.0 in | Wt 185.0 lb

## 2014-06-05 DIAGNOSIS — I10 Essential (primary) hypertension: Secondary | ICD-10-CM

## 2014-06-05 DIAGNOSIS — I480 Paroxysmal atrial fibrillation: Secondary | ICD-10-CM

## 2014-06-05 DIAGNOSIS — I2510 Atherosclerotic heart disease of native coronary artery without angina pectoris: Secondary | ICD-10-CM

## 2014-06-05 DIAGNOSIS — I5032 Chronic diastolic (congestive) heart failure: Secondary | ICD-10-CM | POA: Diagnosis not present

## 2014-06-05 NOTE — Patient Instructions (Addendum)
Medication Instructions:   TAKE LASIX 40 MG TWICE A DAY FOR 5 DAYS THEN GO BACK TO 40 MG ONCE A DAY   TAKE POTASSIUM 10 MEQ TWICE A DAY FOR 5 DAYS THEN GO BACK TO 10 MEQ  ONCE A DAY   Labwork:   Testing/Procedures:   Follow-Up:  PER CHRIS BERG AN AVAILABLE APP ON FLEX SCHEDULE FOR NEXT WEEK   FOR A ONE WEEK FOLLOW CHF   Any Other Special Instructions Will Be Listed Below (If Applicable

## 2014-06-05 NOTE — Progress Notes (Signed)
Patient Name: [name] Date of Encounter: 06/05/2014  Primary Care Provider:  Kevan Ny, MD Primary Cardiologist:  Jerilynn Mages. Croitoru, MD  Chief Complaint  81 year old female status post recent admission for systolic heart failure who presents for follow-up.  Past Medical History   Past Medical History  Diagnosis Date  . Hypertension   . Coronary artery disease     a. 1994 s/p cabg;  b. 09/2013 MV: large, sev intensity, partially reversible inf, apical defect, prior inf/ap infarct w/ mild peri-infarct ischemia->Med Rx.  . Legally blind   . CKD (chronic kidney disease), stage III     a. iii - iv.  . Anemia   . Hypothyroid   . Gastric ulcer   . Hiatal hernia   . Dyspepsia   . UTI (lower urinary tract infection)   . H/O: GI bleed 12//13  . High cholesterol   . Chronic combined systolic and diastolic CHF (congestive heart failure)     a. 01/2014 Echo: EF @ least mod-sev reduced with HK of lat/apical, basal inf walls. basalpost AK, Gr 1DD.  Marland Kitchen DVT of upper extremity (deep vein thrombosis) 06/13/2012    BUE  . Seasonal allergies   . Allergy to perfume   . Type II diabetes mellitus   . History of blood transfusion 2013  . GERD (gastroesophageal reflux disease)   . Daily headache   . Arthritis   . PAF (paroxysmal atrial fibrillation)     a. CHA2DS2VASc = 7-->coumadin.  . 2Nd degree atrioventricular block     a. s/p MDT ADDR01 DC PPM (ser #: [id number]).   Past Surgical History  Procedure Laterality Date  . Esophagogastroduodenoscopy  12/22/2011    Procedure: ESOPHAGOGASTRODUODENOSCOPY (EGD);  Surgeon: Gatha Mayer, MD;  Location: [facility];  Service: Endoscopy;  Laterality: N/A;  . Cardioversion  06/06/06    successful  . Insert / replace / remove pacemaker  06/29/2006    Medtronic adapta  . Cardiac catheterization  10/25/92  . Cardiac catheterization  11/18/03    w/grafts 100%CX LAD 80 & 100%  . Cardiac catheterization  01/24/05    diffuse disease of native vessels    . Cardiac catheterization  06/06/06    severe native CAD  . Coronary artery bypass graft  10/27/92    LIMA to LAD,SVG to LAD second diagonal,obtuse maraginal of the CX and posterior descendingbranch of the RCA  . Cataract extraction w/ intraocular lens  implant, bilateral Bilateral   . Refractive surgery Bilateral     Allergies  Allergies  Allergen Reactions  . Darvon     Unknown reaction   . Digoxin And Related Nausea Only  . Penicillins Other (See Comments)    Was told had allergy from childhood... Unknown reaction  . Percocet [Oxycodone-Acetaminophen] Other (See Comments)    confused  . Percodan [Oxycodone-Aspirin]   . Vicodin [Hydrocodone-Acetaminophen] Other (See Comments)    confused    HPI  81 year old female with the above complex problem list. She has a history of CAD as well as systolic congestive heart failure. Further, she has a history of paroxysmal atrial fibrillation and sick sinus syndrome status post pacemaker placement. She is chronically anticoagulated with Coumadin. She was recently admitted to [facility] with dyspnea and acute on chronic combined systolic and diastolic heart failure. She was diuresed with about a 6 pound weight loss and symptomatically improvement. She did have minor elevation in troponin up to a peak of 0.11 and this was felt most likely  to be regular to demand ischemia in the setting of heart failure. Conservative therapy was recommended. She was subsequently discharged on May 13. Following discharge, her daughter noted some weight gain and called the office. A recommendation was made to take an additional 49 g of Lasix if she was symptomatic however patient's daughter did not note any symptoms and therefore she hasn't changed her dose. Her weight in the meantime has remained around 185 pounds which is up from 179 pounds at discharge. Patient is fairly sedentary and thus hasn't had much in the way of dyspnea on exertion. I have noted lower extremity  edema and more bothersome, increasing abdominal girth and fullness. She has chronic two-pillow orthopnea but denies PND, chest pain, dizziness, syncope, or early satiety. Of note, in discussing the patient's salt intake, her daughter states that they avoid adding salt to food. That said, most of their meals are prepared at either local supermarket or fast food restaurant. She eats fried chicken regularly as well as restaurant style rotisserie chicken and chicken nuggets from [employer]  Home Medications  Prior to Admission medications   Medication Sig Start Date End Date Taking? Authorizing Provider  ACCU-CHEK AVIVA PLUS test strip Use to check blood sugars 2 times daily 01/19/14  Yes Elayne Snare, MD  aspirin 81 MG chewable tablet Chew 81 mg by mouth daily.   Yes Historical Provider, MD  atropine 1 % ophthalmic solution Place 1 drop into the right eye 4 (four) times daily.  10/29/13  Yes Historical Provider, MD  Blood Glucose Monitoring Suppl (ACCU-CHEK AVIVA PLUS) W/DEVICE KIT Use to check blood sugar 2 times per day dx code 250.00 07/30/13  Yes Elayne Snare, MD  feeding supplement, GLUCERNA SHAKE, (GLUCERNA SHAKE) LIQD Take 237 mLs by mouth 2 (two) times daily between meals.   Yes Historical Provider, MD  fluticasone (FLONASE) 50 MCG/ACT nasal spray Place 2 sprays into both nostrils 2 (two) times daily.   Yes Historical Provider, MD  folic acid (FOLVITE) 1 MG tablet Take 1 tablet (1 mg total) by mouth daily. 01/19/14  Yes Mihai Croitoru, MD  furosemide (LASIX) 40 MG tablet Take 1 tablet (40 mg total) by mouth daily. 05/29/14  Yes Velvet Bathe, MD  gabapentin (NEURONTIN) 100 MG capsule Take 100 mg by mouth 2 (two) times daily.    Yes Historical Provider, MD  glipiZIDE (GLUCOTROL) 5 MG tablet Take one-half tablet by  mouth daily only if blood  sugar is over 200 Patient taking differently: Take 2.5 mg by mouth at bedtime.  05/18/14  Yes Elayne Snare, MD  Hyoscyamine Sulfate 0.375 MG TBCR A1 tab twice a day as  needed, abdominal pain 06/30/13  Yes Inda Castle, MD  Insulin Glargine (LANTUS SOLOSTAR) 100 UNIT/ML Solostar Pen Inject 6 Units into the skin every morning. 05/29/14  Yes Velvet Bathe, MD  Lancets (ACCU-CHEK MULTICLIX) lancets Use as instructed to check blood sugar 2 times per day dx code 250.00 07/30/13  Yes Elayne Snare, MD  Lancets Misc. (ACCU-CHEK FASTCLIX LANCET) KIT Use as directec 03/17/13  Yes Elayne Snare, MD  levothyroxine (SYNTHROID, LEVOTHROID) 50 MCG tablet Take 1 tablet (50 mcg total) by mouth daily before breakfast. 01/05/14  Yes Elayne Snare, MD  loratadine (CLARITIN) 10 MG tablet Take 10 mg by mouth daily.   Yes Historical Provider, MD  meclizine (ANTIVERT) 25 MG tablet Take 25 mg by mouth 3 (three) times daily as needed for dizziness.   Yes Historical Provider, MD  metoprolol tartrate (LOPRESSOR) 25  MG tablet Take 1 tablet (25 mg total) by mouth 2 (two) times daily. Patient taking differently: Take 25 mg by mouth as needed.  02/07/14  Yes Reyne Dumas, MD  nitroGLYCERIN (NITRODUR - DOSED IN MG/24 HR) 0.2 mg/hr patch Place 1 patch (0.2 mg total) onto the skin daily. 02/12/14  Yes Luke K Kilroy, PA-C  omeprazole (PRILOSEC) 20 MG capsule Take 1 capsule (20 mg total) by mouth daily. 06/04/13  Yes Mihai Croitoru, MD  Polyvinyl Alcohol-Povidone (REFRESH OP) Place 1 drop into both eyes daily.    Yes Historical Provider, MD  potassium chloride SA (K-DUR,KLOR-CON) 10 MEQ tablet Take 1 tablet (10 mEq total) by mouth daily. 05/30/14  Yes Velvet Bathe, MD  ranolazine ([name]) 500 MG 12 hr tablet Take 1 tablet (500 mg total) by mouth daily. 05/14/14  Yes Mihai Croitoru, MD  rosuvastatin (CRESTOR) 10 MG tablet Take 1 tablet (10 mg total) by mouth 3 (three) times a week. [id number] Exp: 8-18 02/18/14  Yes Luke K Kilroy, PA-C  tobramycin (TOBREX) 0.3 % ophthalmic solution Place 1 drop into the right eye daily as needed.  10/18/12  Yes Historical Provider, MD  traMADol (ULTRAM) 50 MG tablet Take 1 tablet (50 mg  total) by mouth every 6 (six) hours as needed for moderate pain. 05/14/14  Yes Mihai Croitoru, MD  Vitamin D, Ergocalciferol, (DRISDOL) 50000 UNITS CAPS capsule Take 50,000 Units by mouth daily.    Yes Historical Provider, MD  warfarin (COUMADIN) 2.5 MG tablet Take 1 to 1.5 tablets by mouth daily as directed by coumadin clinic Patient taking differently: Take 2.5-3.75 mg by mouth See admin instructions. Take 1 tablet on Sunday and Wednesday. Take 1.5 tablets all other days. 02/09/14  Yes Reyne Dumas, MD  zolpidem (AMBIEN) 10 MG tablet Take 5 mg by mouth at bedtime as needed. sleep   Yes Historical Provider, MD    Review of Systems  Weight gain with lower extremity edema and increasing abdominal girth as above. She has not been experiencing chest pain, dyspnea, PND, dizziness, syncope, or early satiety. She does have chronic 2 pillow orthopnea. She also reports intermittent constipation. All other systems reviewed and are otherwise negative except as noted above.  Physical Exam  VS: Blood pressure is 126/74 Pulse 68  Ht $R'5\' 4"'GX$  (1.626 m)  Wt 185 lb (83.915 kg)  BMI 31.74 kg/m2 , BMI Body mass index is 31.74 kg/(m^2). GEN: Well nourished, well developed, in no acute distress. HEENT: normal. Neck: Supple, no JVD, carotid bruits, or masses. Cardiac: RRR, no murmurs, rubs, or gallops. No clubbing, cyanosis, 1+ bilateral lower extremity edema.  Radials/DP/PT 2+ and equal bilaterally.  Respiratory:  Respirations regular and unlabored, bibasilar crackles. GI: Firm, protuberant, nontender, BS + x 4. MS: no deformity or atrophy. Skin: warm and dry, no rash. Neuro:  Strength and sensation are intact. Psych: Normal affect.  Accessory Clinical Findings  ECG - a sense V pace, 68  Assessment & Plan  1.  Acute on chronic combined systolic and diastolic congestive heart failure/ischemic cardiopathy: Patient was recently hospitalized in diuresis with 6 pound weight loss and discharge weight of 179  pounds. Since discharge, she has resumed her previous diet which appears to be high in processed and fast foods and thus salts. In that setting, her weight is back up to 185 pounds and she has been having increasing abdominal girth and also mild lower extremity edema. She's not had dyspnea or orthopnea. We had a long discussion about the importance of  sodium restriction and what types of foods are most likely to contain salt already. I have asked her to increase her Lasix to 40 mg twice a day for the next 5 days and then come back down to 40 mg daily. With that, she will also increase her potassium to 10 mEq twice a day for 5 days. I am hopeful that with oral diuresis and significant dietary changes, she may be up to keep off fluid this time.  She remains on bb therapy.  No acei/arb/arni presumably 2/2 CKD III.  Given her relative frailty, I will arrange for early follow-up next week.   2.  CAD:  S/p prior CABG with stable, though abnl MV last year.  No c/p. Cont asa, bb, statin, ranexa.  3.  Essential HTN:  Stable on bb.  4.  HL:  LDL 61 last June. Cont crestor.  5.  CKD III:  Stable during most recent hospitalization.  6.  DM II: cont home meds - per IM.  7.  Dispo:  F/U next week to reassess volume.  Murray Hodgkins, NP 06/05/2014, 1:32 PM

## 2014-06-09 ENCOUNTER — Other Ambulatory Visit: Payer: Self-pay | Admitting: *Deleted

## 2014-06-09 ENCOUNTER — Telehealth: Payer: Self-pay | Admitting: Endocrinology

## 2014-06-09 MED ORDER — INSULIN PEN NEEDLE 32G X 4 MM MISC
Status: DC
Start: 2014-06-09 — End: 2014-07-30

## 2014-06-09 NOTE — Telephone Encounter (Signed)
Patient would like to have her medication sent to her pharmacy   Rx: Pen needles    Pharmacy: [facility]   Thank you

## 2014-06-09 NOTE — Telephone Encounter (Signed)
Pt is asking that we call this in right now they have to pick it up before 1045

## 2014-06-09 NOTE — Telephone Encounter (Signed)
rx sent

## 2014-06-11 NOTE — Progress Notes (Signed)
Cardiology Office Note   Date:  06/12/2014   ID:  [name], DOB [birth year], MRN [id number]  PCP:  Kevan Ny, MD  Cardiologist:  Dr. Sanda Klein     Chief Complaint  Patient presents with  . Congestive Heart Failure     History of Present Illness: [name] is a 81 y.o. female with a hx of chronic combined systolic and diastolic HF, CAD s/p CABG 1994, CKD, HTN, HL, prior GI bleed, PAF, 2nd degree HB s/p PPM, DM2.  She is on chronic coumadin AC. Nuclear study in 9/15 demonstrated a large partially reversible inferior-apical defect with EF 35% (high risk).  It was decided to pursue medical Rx.    She was recently admitted to [facility] with dyspnea and acute on chronic combined systolic and diastolic heart failure. She did have minor elevation in troponin up to a peak of 0.11 and this was felt most likely to be related to demand ischemia in the setting of heart failure. Conservative therapy was recommended.  Following discharge, her daughter noted some weight gain.  She was seen by Ignacia Bayley, NP 06/05/14 in FU.  She had evidence of volume excess and the patient's diet was noted to be suspect with indiscretion with salt.  Lasix was adjusted and she returns for FU.    She is here with her daughter.  She is fairly sedentary.  She transfers to a bedside commode.  She gets out of breath with this and has chest pain.  This is stable.  She wears a NTG patch.  She gets dizzy at times.  Denies syncope.  Denies orthopnea, PND.  LE edema is stable.  Weights at home have been 180-183 since last week.  HHRN comes out to check her Coumadin.    Studies/Reports Reviewed Today:  Echo 02/05/14 - Left ventricle: Poor acoiustic windows limit study. LVEF is at least moderate to severely depressed with hypokinesis of the lateral, apical, basal inferior walls The basal posterior wall appears akinetice. Anterior wall is not well seen WOuld recommlimited echo with echocontrast to further  define LVEF. and wallmotion. The cavity size was normal. Wall thickness was normal.Doppler parameters are consistent with abnormal left ventricularrelaxation (grade 1 diastolic dysfunction). - Pericardium, extracardiac: A trivial pericardial effusion was identified.  Myoview 08/29/13 Overall Impression: High risk stress nuclear study with large, severe intensity, partially reversible, inferior and apical defect consistent with prior inferior apical infarct and mild peri-infarct ischemia towards the apex; perfusion similar to previous study; study high risk due to reduced LV function.  EF 35% LV Wall Motion: Inferior and apical akinesis; LV function worse compared to previous   Past Medical History  Diagnosis Date  . Hypertension   . Coronary artery disease     a. 1994 s/p cabg;  b. 09/2013 MV: large, sev intensity, partially reversible inf, apical defect, prior inf/ap infarct w/ mild peri-infarct ischemia->Med Rx.  . Legally blind   . CKD (chronic kidney disease), stage III     a. iii - iv.  . Anemia   . Hypothyroid   . Gastric ulcer   . Hiatal hernia   . Dyspepsia   . UTI (lower urinary tract infection)   . H/O: GI bleed 12//13  . High cholesterol   . Chronic combined systolic and diastolic CHF (congestive heart failure)     a. 01/2014 Echo: EF @ least mod-sev reduced with HK of lat/apical, basal inf walls. basalpost AK, Gr 1DD.  Marland Kitchen DVT of  upper extremity (deep vein thrombosis) 06/13/2012    BUE  . Seasonal allergies   . Allergy to perfume   . Type II diabetes mellitus   . History of blood transfusion 2013  . GERD (gastroesophageal reflux disease)   . Daily headache   . Arthritis   . PAF (paroxysmal atrial fibrillation)     a. CHA2DS2VASc = 7-->coumadin.  . 2Nd degree atrioventricular block     a. s/p MDT ADDR01 DC PPM (ser #: [id number]).    Past Surgical History  Procedure Laterality Date  . Esophagogastroduodenoscopy  12/22/2011    Procedure:  ESOPHAGOGASTRODUODENOSCOPY (EGD);  Surgeon: Gatha Mayer, MD;  Location: [facility];  Service: Endoscopy;  Laterality: N/A;  . Cardioversion  06/06/06    successful  . Insert / replace / remove pacemaker  06/29/2006    Medtronic adapta  . Cardiac catheterization  10/25/92  . Cardiac catheterization  11/18/03    w/grafts 100%CX LAD 80 & 100%  . Cardiac catheterization  01/24/05    diffuse disease of native vessels  . Cardiac catheterization  06/06/06    severe native CAD  . Coronary artery bypass graft  10/27/92    LIMA to LAD,SVG to LAD second diagonal,obtuse maraginal of the CX and posterior descendingbranch of the RCA  . Cataract extraction w/ intraocular lens  implant, bilateral Bilateral   . Refractive surgery Bilateral      Current Outpatient Prescriptions  Medication Sig Dispense Refill  . ACCU-CHEK AVIVA PLUS test strip Use to check blood sugars 2 times daily 200 each 1  . aspirin 81 MG chewable tablet Chew 81 mg by mouth daily.    Marland Kitchen atropine 1 % ophthalmic solution Place 1 drop into the right eye 4 (four) times daily.   0  . Blood Glucose Monitoring Suppl (ACCU-CHEK AVIVA PLUS) W/DEVICE KIT Use to check blood sugar 2 times per day dx code 250.00 1 kit 0  . feeding supplement, GLUCERNA SHAKE, (GLUCERNA SHAKE) LIQD Take 237 mLs by mouth 2 (two) times daily between meals.    . fluticasone (FLONASE) 50 MCG/ACT nasal spray Place 2 sprays into both nostrils 2 (two) times daily.    . folic acid (FOLVITE) 1 MG tablet Take 1 tablet (1 mg total) by mouth daily. 90 tablet 3  . furosemide (LASIX) 40 MG tablet Take 1 tablet (40 mg total) by mouth daily. 90 tablet 3  . gabapentin (NEURONTIN) 100 MG capsule Take 100 mg by mouth 2 (two) times daily.     Marland Kitchen glipiZIDE (GLUCOTROL) 5 MG tablet Take one-half tablet by  mouth daily only if blood  sugar is over 200 (Patient taking differently: Take 2.5 mg by mouth at bedtime. ) 45 tablet 1  . Hyoscyamine Sulfate 0.375 MG TBCR A1 tab twice a day as  needed, abdominal pain 25 tablet 1  . Insulin Glargine (LANTUS SOLOSTAR) 100 UNIT/ML Solostar Pen Inject 6 Units into the skin every morning. 5 pen 1  . Insulin Pen Needle (BD PEN NEEDLE NANO U/F) 32G X 4 MM MISC Use one per day to inject insulin 30 each 5  . Lancets (ACCU-CHEK MULTICLIX) lancets Use as instructed to check blood sugar 2 times per day dx code 250.00 200 each 1  . Lancets Misc. (ACCU-CHEK FASTCLIX LANCET) KIT Use as directec 1 kit 1  . levothyroxine (SYNTHROID, LEVOTHROID) 50 MCG tablet Take 1 tablet (50 mcg total) by mouth daily before breakfast. 30 tablet 5  . loratadine (CLARITIN) 10 MG tablet Take  10 mg by mouth daily.    . meclizine (ANTIVERT) 25 MG tablet Take 25 mg by mouth 3 (three) times daily as needed for dizziness.    . metoprolol tartrate (LOPRESSOR) 25 MG tablet Take 1 tablet (25 mg total) by mouth 2 (two) times daily. (Patient taking differently: Take 25 mg by mouth as needed. ) 90 tablet 6  . nitroGLYCERIN (NITRODUR - DOSED IN MG/24 HR) 0.2 mg/hr patch Place 1 patch (0.2 mg total) onto the skin daily. 30 patch 6  . omeprazole (PRILOSEC) 20 MG capsule Take 1 capsule (20 mg total) by mouth daily. 90 capsule 3  . Polyvinyl Alcohol-Povidone (REFRESH OP) Place 1 drop into both eyes daily.     . potassium chloride SA (K-DUR,KLOR-CON) 10 MEQ tablet Take 1 tablet (10 mEq total) by mouth daily. 15 tablet 0  . ranolazine ([name]) 500 MG 12 hr tablet Take 1 tablet (500 mg total) by mouth daily. 56 tablet 0  . rosuvastatin (CRESTOR) 10 MG tablet Take 1 tablet (10 mg total) by mouth 3 (three) times a week. [id number] Exp: 8-18 21 tablet 0  . tobramycin (TOBREX) 0.3 % ophthalmic solution Place 1 drop into the right eye daily as needed (eye iritation).     . traMADol (ULTRAM) 50 MG tablet Take 1 tablet (50 mg total) by mouth every 6 (six) hours as needed for moderate pain. 30 tablet 0  . Vitamin D, Ergocalciferol, (DRISDOL) 50000 UNITS CAPS capsule Take 50,000 Units by mouth daily.      Marland Kitchen warfarin (COUMADIN) 2.5 MG tablet Take 1 to 1.5 tablets by mouth daily as directed by coumadin clinic (Patient taking differently: Take 2.5-3.75 mg by mouth See admin instructions. Take 1 tablet on Sunday and Wednesday. Take 1.5 tablets all other days.) 40 tablet 2  . zolpidem (AMBIEN) 10 MG tablet Take 5 mg by mouth at bedtime as needed. sleep     No current facility-administered medications for this visit.    Allergies:   Darvon; Digoxin and related; Penicillins; Percocet; Percodan; and Vicodin    Social History:  The patient  reports that she has never smoked. She has never used smokeless tobacco. She reports that she does not drink alcohol or use illicit drugs.   Family History:  The patient's family history includes Breast cancer in her daughter; Colon polyps in her daughter; Diabetes in her brother, daughter, father, and son; Heart attack in her brother; Heart disease in her father; Stroke in her sister.    ROS:   Please see the history of present illness.   Review of Systems  Cardiovascular: Positive for dyspnea on exertion and leg swelling.  Neurological: Positive for dizziness.  All other systems reviewed and are negative.     PHYSICAL EXAM: VS:  BP 122/60 mmHg  Pulse 75  Ht _0  (1.626 m)  Wt 184 lb 6.4 oz (83.643 kg)  BMI 31.64 kg/m2    Wt Readings from Last 3 Encounters:  06/12/14 184 lb 6.4 oz (83.643 kg)  06/05/14 185 lb (83.915 kg)  05/29/14 178 lb 8 oz (80.967 kg)     GEN: Well nourished, well developed, in no acute distress HEENT: normal Neck: no JVD at 90 degrees, no masses Cardiac:  Normal S1/S2, RRR; no murmur, no rubs or gallops, trace-1+ bilateral LE edema   Respiratory:  clear to auscultation bilaterally, no wheezing, rhonchi or rales; crackles at bases cleared with cough. GI: soft, nontender, nondistended, + BS MS: no deformity or atrophy Skin: warm  and dry  Neuro:  CNs II-XII intact, Strength and sensation are intact Psych: Normal  affect   EKG:  EKG is ordered today.  It demonstrates:   NSR, V paced, HR 75   Recent Labs: 02/12/2014: ALT 69* 05/24/2014: B Natriuretic Peptide 169.5* 05/25/2014: TSH 4.519* 05/26/2014: Hemoglobin 11.0*; Platelets 228 05/28/2014: BUN 23*; Creatinine 1.38*; Potassium 4.3; Sodium 137    Lipid Panel    Component Value Date/Time   CHOL 170 10/24/2013 1006   TRIG 201.0* 10/24/2013 1006   HDL 30.50* 10/24/2013 1006   CHOLHDL 6 10/24/2013 1006   VLDL 40.2* 10/24/2013 1006   LDLCALC 61 06/24/2013 1127   LDLDIRECT 92.3 10/24/2013 1006      ASSESSMENT AND PLAN:  Chronic combined systolic and diastolic CHF (congestive heart failure):  Volume is fairly stable.  It looks like if her weight is ~ 180 lbs, she is close to baseline.  I have asked her to weigh daily.  She should take Lasix and K+ Twice daily until her weight is 180 or less.  She should call if she does this 3 days in a row or more.  She will get a BMET today and repeat BMET in 2 weeks.  She is trying to refrain from salt in her diet.   Coronary artery disease involving native coronary artery of native heart with unspecified angina pectoris:  She has fairly stable angina.  Continue current dose of [name], NTG patch, ASA, beta-blocker, statin.    Essential hypertension:  BP controlled.   CKD (chronic kidney disease), stage III - Plan: Basic Metabolic Panel (BMET)  Complete heart block s/p Pacemaker:  FU with EP as planned.   Hyperlipidemia:  Continue statin.    Current medicines are reviewed at length with the patient today.  Concerns regarding medicines are as outlined above.  The following changes have been made:    Adjust Lasix to Twice daily if weight > 180 lbs as outlined above.   Labs/ tests ordered today include:  Orders Placed This Encounter  Procedures  . Basic Metabolic Panel (BMET)  . EKG 12-Lead    Disposition:   FU with Dr. Dani Gobble Croitoru 4-6 weeks.    Signed, Versie Starks, MHS 06/12/2014 1:13 PM     [facility] [place], [place], [place]  [address] Phone: [phone]; Fax: [phone]

## 2014-06-12 ENCOUNTER — Ambulatory Visit: Payer: Medicare Other | Admitting: Endocrinology

## 2014-06-12 ENCOUNTER — Encounter: Payer: Self-pay | Admitting: Physician Assistant

## 2014-06-12 ENCOUNTER — Telehealth: Payer: Self-pay | Admitting: Endocrinology

## 2014-06-12 ENCOUNTER — Ambulatory Visit (INDEPENDENT_AMBULATORY_CARE_PROVIDER_SITE_OTHER): Payer: Medicare Other | Admitting: Physician Assistant

## 2014-06-12 ENCOUNTER — Encounter: Payer: Self-pay | Admitting: *Deleted

## 2014-06-12 ENCOUNTER — Telehealth: Payer: Self-pay | Admitting: *Deleted

## 2014-06-12 VITALS — BP 122/60 | HR 75 | Ht 64.0 in | Wt 184.4 lb

## 2014-06-12 DIAGNOSIS — N183 Chronic kidney disease, stage 3 unspecified: Secondary | ICD-10-CM

## 2014-06-12 DIAGNOSIS — Z950 Presence of cardiac pacemaker: Secondary | ICD-10-CM

## 2014-06-12 DIAGNOSIS — I10 Essential (primary) hypertension: Secondary | ICD-10-CM | POA: Diagnosis not present

## 2014-06-12 DIAGNOSIS — I5042 Chronic combined systolic (congestive) and diastolic (congestive) heart failure: Secondary | ICD-10-CM | POA: Diagnosis not present

## 2014-06-12 DIAGNOSIS — E785 Hyperlipidemia, unspecified: Secondary | ICD-10-CM

## 2014-06-12 DIAGNOSIS — I5043 Acute on chronic combined systolic (congestive) and diastolic (congestive) heart failure: Secondary | ICD-10-CM | POA: Diagnosis not present

## 2014-06-12 DIAGNOSIS — I25119 Atherosclerotic heart disease of native coronary artery with unspecified angina pectoris: Secondary | ICD-10-CM | POA: Diagnosis not present

## 2014-06-12 DIAGNOSIS — I5032 Chronic diastolic (congestive) heart failure: Secondary | ICD-10-CM

## 2014-06-12 DIAGNOSIS — I442 Atrioventricular block, complete: Secondary | ICD-10-CM

## 2014-06-12 LAB — BASIC METABOLIC PANEL
BUN: 19 mg/dL (ref 6–23)
CO2: 34 meq/L — AB (ref 19–32)
Calcium: 9.7 mg/dL (ref 8.4–10.5)
Chloride: 100 mEq/L (ref 96–112)
Creatinine, Ser: 1.28 mg/dL — ABNORMAL HIGH (ref 0.40–1.20)
GFR: 50.16 mL/min — ABNORMAL LOW (ref >60.00)
Glucose, Bld: 152 mg/dL — ABNORMAL HIGH (ref 70–99)
POTASSIUM: 4.3 meq/L (ref 3.5–5.1)
Sodium: 138 mEq/L (ref 135–145)

## 2014-06-12 MED ORDER — FUROSEMIDE 40 MG PO TABS: 40.0000 mg | ORAL_TABLET | Freq: Every day | ORAL | Status: DC

## 2014-06-12 NOTE — Telephone Encounter (Signed)
Needs to be seen back as soon as possible

## 2014-06-12 NOTE — Telephone Encounter (Signed)
Pt and her daughter [name] aware of lab results with verbal understanding by phone.

## 2014-06-12 NOTE — Patient Instructions (Signed)
Medication Instructions:  1. INCREASE LASIX TO 40 MG TWICE DAILY UNTIL YOUR WEIGHT IS BACK TO OR BELOW 180 LBS; ONCE BACK TO BASELINE 180 LB YOU WILL GO BACK TO YOUR REGULAR DOSE OF LASIX;  IF YOU HAVE TO TAKE THE INCREASED DOSE OF LASIX FOR 3 DAYS AND YOUR WEIGHT IS NOT DOWN THEN YOU WILL NEED TO CALL [phone] TO LET [employer]OTT [employer], PAC.  2. INCREASE POTASSIUM TO 10 MEQ TWICE DAILY UNTIL YOUR WEIGHT IS BACK TO OR BELOW 180 LBS; ONCE BACK TO BASELINE 180 LB YOU WILL GO BACK TO YOUR REGULAR DOSE OF POTASSIUM;  IF YOU HAVE TO TAKE THE INCREASED DOSE OF POTASSIUM FOR 3 DAYS AND YOUR WEIGHT IS NOT DOWN THEN YOU WILL NEED TO CALL [phone] TO LET [employer], PAC.   Labwork: 1. TODAY BMET  2. YOU HAVE BEEN GIVEN AN RX FOR HHRN TO GET LAB WORK IN 2 WEEKS WITH THE RESULTS TO BE FAXED TO [place], [place]  Testing/Procedures:  Follow-Up: DR. Sallyanne Kuster 4-6 WEEKS   Any Other Special Instructions Will Be Listed Below (If Applicable).

## 2014-06-12 NOTE — Telephone Encounter (Signed)
Patient no showed today's appt. Please advise on how to follow up. A. No follow up necessary. B. Follow up urgent. Contact patient immediately. C. Follow up necessary. Contact patient and schedule visit in ___ days. D. Follow up advised. Contact patient and schedule visit in ____weeks.  Letter has already been mailed

## 2014-06-16 ENCOUNTER — Telehealth: Payer: Self-pay | Admitting: Physician Assistant

## 2014-06-16 NOTE — Telephone Encounter (Signed)
Pt's dtr calling re pt having weight gain and wants to talk to nurse

## 2014-06-17 ENCOUNTER — Ambulatory Visit (INDEPENDENT_AMBULATORY_CARE_PROVIDER_SITE_OTHER): Payer: Medicare Other | Admitting: Pharmacist Clinician (PhC)/ Clinical Pharmacy Specialist

## 2014-06-17 DIAGNOSIS — Z7901 Long term (current) use of anticoagulants: Secondary | ICD-10-CM

## 2014-06-17 LAB — POCT INR: INR: 2

## 2014-06-17 MED ORDER — POTASSIUM CHLORIDE CRYS ER 10 MEQ PO TBCR: 10.0000 meq | EXTENDED_RELEASE_TABLET | Freq: Two times a day (BID) | ORAL | Status: DC

## 2014-06-17 MED ORDER — FUROSEMIDE 40 MG PO TABS: 40.0000 mg | ORAL_TABLET | Freq: Two times a day (BID) | ORAL | Status: DC

## 2014-06-17 NOTE — Telephone Encounter (Signed)
Breast enlargement not likely to be from pacer or heart failure.  Would FU with PCP to examine breast and make recommendations.  She can remain on Lasix 40 Twice daily and K+ 10 Twice daily. Check BMET Friday or Monday. Richardson Dopp, PA-C   06/17/2014 5:14 PM

## 2014-06-17 NOTE — Telephone Encounter (Signed)
s/w pt's son [name], could not reach pt's daughter [name] voice mail box full. Went over the recommendations per [employer]. PA cont lasix 40 mg BID and K+ 10 meq BID, BMET 6/3. Advised for pt to f/u w/PCP about breast swelling. Son verbalized plan of care x 2

## 2014-06-17 NOTE — Telephone Encounter (Signed)
Pt gave me permission to s/w her daughter [name]. Daughter states pt's weight is still not at baseline 180 LB'S after the 3 days of increased lasix and potassium; see ov note 06/12/14 from [place]. Daughter states pt has edema with her feet, denies any sob cp. Daughter states [facility] yesterday told pt to go back on lasix 40 mg BID and K+ 10 meq BID for 3 more days. Advised will d/w PA of her concerns and call back with any recommendations. Daughter also states that pt's left breast by her pacemaker site is larger. When I asked for more specific questions as to exactly where by the pacemaker site daughter states that it is really from the nipple area and a little lower where the breast is larger. Daughter states [facility] told her that it could be fluid. I asked daughter how long has the left breast been larger in the area she is describing, daughter states for a couple of months. I advised not sure what is causing that area to be larger (ie fluid), though I will certainly d/w Brynda Rim. PA. Daughter asked was there a way to see if there was fluid in the left breast area. I said a chest x-ray would show the upper chest and lungs. I advised daughter since this has been a couple of months with the left breast larger than the right I would also contact primary care. Daughter said ok. Daughter [name] said ok and thank you for all of our help.

## 2014-06-17 NOTE — Addendum Note (Signed)
Addended by: Michae Kava on: 06/17/2014 05:25 PM   Modules accepted: Orders

## 2014-06-17 NOTE — Telephone Encounter (Signed)
s/w pt's son Jenny Reichmann, could not reach pt's daughter [name] voice mail box full. Went over the recommendations per [employer]. PA cont lasix 40 mg BID and K+ 10 meq BID, BMET 6/3. Advised for pt to f/u w/PCP about breast swelling. Son verbalized plan of care x 2

## 2014-06-17 NOTE — Telephone Encounter (Signed)
Follow Up   Pts daughter calling about recent weight gain. Please call.

## 2014-06-18 ENCOUNTER — Encounter: Payer: Self-pay | Admitting: Cardiovascular Disease

## 2014-06-18 NOTE — Telephone Encounter (Signed)
Called back daughter's phone call and I s/w pt's son Nicole Kindred. He said his sister [name] was cb to verify lab appt that it was in the [address]  office. I advised that is correct and we will see pt tomorrow for lab work.

## 2014-06-18 NOTE — Telephone Encounter (Signed)
Follow up      Pt is due to have labs drawn here tomorrow.  The home health nurse is coming to her house tomorrow to draw labs also.  Can she draw our labs also and pt will not have to have labs drawn twice.

## 2014-06-19 ENCOUNTER — Other Ambulatory Visit (INDEPENDENT_AMBULATORY_CARE_PROVIDER_SITE_OTHER): Payer: Medicare Other | Admitting: *Deleted

## 2014-06-19 ENCOUNTER — Telehealth: Payer: Self-pay | Admitting: Endocrinology

## 2014-06-19 DIAGNOSIS — I5042 Chronic combined systolic (congestive) and diastolic (congestive) heart failure: Secondary | ICD-10-CM

## 2014-06-19 DIAGNOSIS — I5032 Chronic diastolic (congestive) heart failure: Secondary | ICD-10-CM

## 2014-06-19 LAB — BASIC METABOLIC PANEL
BUN: 21 mg/dL (ref 6–23)
CALCIUM: 9.4 mg/dL (ref 8.4–10.5)
CO2: 32 mEq/L (ref 19–32)
Chloride: 100 mEq/L (ref 96–112)
Creatinine, Ser: 1.46 mg/dL — ABNORMAL HIGH (ref 0.40–1.20)
GFR: 43.09 mL/min — ABNORMAL LOW (ref >60.00)
GLUCOSE: 191 mg/dL — AB (ref 70–99)
Potassium: 4.2 mEq/L (ref 3.5–5.1)
Sodium: 137 mEq/L (ref 135–145)

## 2014-06-19 MED ORDER — LEVOTHYROXINE SODIUM 50 MCG PO TABS: 50.0000 ug | ORAL_TABLET | Freq: Every day | ORAL | Status: DC

## 2014-06-19 NOTE — Telephone Encounter (Signed)
Rx sent per pt's request.  

## 2014-06-19 NOTE — Telephone Encounter (Signed)
Patients daughter called and would like a refill on her medication   Rx: Synthroid   Pharmacy: [facility]    Thank you

## 2014-06-22 ENCOUNTER — Telehealth: Payer: Self-pay | Admitting: Physician Assistant

## 2014-06-22 DIAGNOSIS — I5043 Acute on chronic combined systolic (congestive) and diastolic (congestive) heart failure: Secondary | ICD-10-CM

## 2014-06-22 DIAGNOSIS — N183 Chronic kidney disease, stage 3 unspecified: Secondary | ICD-10-CM

## 2014-06-22 NOTE — Telephone Encounter (Signed)
New message      Returning Carol's call to get lab results

## 2014-06-22 NOTE — Telephone Encounter (Signed)
Pt's daughter notified of lab results with verbal understanding by phone of results given today. Daughter asked me if they could [place] for pt's food, I advised no that salt is salt.  I did advise to use things like Mrs. Dash. Daughhter ok .

## 2014-06-23 ENCOUNTER — Telehealth: Payer: Self-pay | Admitting: Endocrinology

## 2014-06-23 NOTE — Telephone Encounter (Signed)
No answer on call back 

## 2014-06-23 NOTE — Telephone Encounter (Signed)
Patients daughter called and would like for Suanne Marker to please call her back    Thank you

## 2014-06-25 ENCOUNTER — Encounter: Payer: Self-pay | Admitting: Endocrinology

## 2014-06-25 ENCOUNTER — Ambulatory Visit (INDEPENDENT_AMBULATORY_CARE_PROVIDER_SITE_OTHER): Payer: Medicare Other | Admitting: Endocrinology

## 2014-06-25 ENCOUNTER — Other Ambulatory Visit: Payer: Self-pay | Admitting: *Deleted

## 2014-06-25 VITALS — BP 118/70 | HR 62 | Temp 97.8°F | Resp 16 | Ht 64.0 in | Wt 187.4 lb

## 2014-06-25 DIAGNOSIS — E038 Other specified hypothyroidism: Secondary | ICD-10-CM

## 2014-06-25 DIAGNOSIS — E063 Autoimmune thyroiditis: Secondary | ICD-10-CM

## 2014-06-25 DIAGNOSIS — IMO0002 Reserved for concepts with insufficient information to code with codable children: Secondary | ICD-10-CM

## 2014-06-25 DIAGNOSIS — E1165 Type 2 diabetes mellitus with hyperglycemia: Secondary | ICD-10-CM

## 2014-06-25 LAB — T4, FREE: Free T4: 0.7 ng/dL (ref 0.60–1.60)

## 2014-06-25 LAB — TSH: TSH: 2.65 u[IU]/mL (ref 0.35–4.50)

## 2014-06-25 MED ORDER — CANAGLIFLOZIN 100 MG PO TABS: ORAL_TABLET | ORAL | Status: DC

## 2014-06-25 NOTE — Progress Notes (Signed)
Patient ID: [name], female   DOB: 09-30-1920, 81 y.o.   MRN: [id number]   Reason for Appointment: Diabetes follow-up   History of Present Illness   Diagnosis: Type 2 diabetes mellitus, date of diagnosis: 1972.   She had been on insulin for several years and this had been tapered off in 2013 because of weight loss and lower blood sugars. Subsequently blood sugars had been generally reasonably good.  Because of her age and multiple medical problems she has been monitored without insulin  Recent history:  Her Lantus was restarted at 10 units daily  in February and she is taking this now in the mornings Also taking glipizide before supper and initially was given 2.5 mg and now her son is giving heard a whole tablet since readings in the evenings are relatively higher until recently With this regimen her fasting blood sugars have been improving and now coming down further Blood sugars in the mornings are relatively lower and has had a couple of readings around 65 also without symptoms Blood sugars periodically are low normal at suppertime also but not consistently She has only one readings late at night which was 184  Again she is checking her blood sugars mostly before breakfast and supper and none after meals as directed Her diet is still about the same, having small portions of regular soft drinks at times too No hypoglycemia Her weight has been about the same  Monitors blood glucose: 2 times daily.  Glucometer: Accucheck [employer].  Recent readings from download: FASTING =  68-135, recently averaging 100 Suppertime 64-184 with recent average 136 Overall average for the last month 121  Meals: 2 meals at about noon and 7 pm; sometimes has lemonade or sprite. Has small portions Dietician visit: Most recent:2001.     Wt Readings from Last 3 Encounters:  06/26/14 189 lb (85.73 kg)  06/25/14 187 lb 6.4 oz (85.004 kg)  06/12/14 184 lb 6.4 oz (83.643 kg)   Lab  Results  Component Value Date   HGBA1C 7.3* 05/25/2014   HGBA1C 8.2* 04/16/2014   HGBA1C 7.9* 02/04/2014   Lab Results  Component Value Date   LDLCALC 61 06/24/2013   CREATININE 1.46* 06/19/2014       Medication List       This list is accurate as of: 06/25/14 11:59 PM.  Always use your most recent med list.               ACCU-CHEK AVIVA PLUS test strip  Generic drug:  glucose blood  Use to check blood sugars 2 times daily     ACCU-CHEK AVIVA PLUS W/DEVICE Kit  Use to check blood sugar 2 times per day dx code 250.00     ACCU-CHEK FASTCLIX LANCET Kit  Use as directec     accu-chek multiclix lancets  Use as instructed to check blood sugar 2 times per day dx code 250.00     aspirin 81 MG chewable tablet  Chew 81 mg by mouth daily.     atropine 1 % ophthalmic solution  Place 1 drop into the right eye 4 (four) times daily.     feeding supplement (GLUCERNA SHAKE) Liqd  Take 237 mLs by mouth 2 (two) times daily between meals.     fluticasone 50 MCG/ACT nasal spray  Commonly known as:  FLONASE  Place 2 sprays into both nostrils 2 (two) times daily.     folic acid 1 MG tablet  Commonly known as:  FOLVITE  Take 1 tablet (1 mg total) by mouth daily.     furosemide 40 MG tablet  Commonly known as:  LASIX  Take 1 tablet (40 mg total) by mouth 2 (two) times daily.     gabapentin 100 MG capsule  Commonly known as:  NEURONTIN  Take 100 mg by mouth 2 (two) times daily.     glipiZIDE 5 MG tablet  Commonly known as:  GLUCOTROL  Take one-half tablet by  mouth daily only if blood  sugar is over 200     Hyoscyamine Sulfate 0.375 MG Tbcr  A1 tab twice a day as needed, abdominal pain     Insulin Glargine 100 UNIT/ML Solostar Pen  Commonly known as:  LANTUS SOLOSTAR  Inject 6 Units into the skin every morning.     Insulin Pen Needle 32G X 4 MM Misc  Commonly known as:  BD PEN NEEDLE NANO U/F  Use one per day to inject insulin     levothyroxine 50 MCG tablet  Commonly  known as:  SYNTHROID, LEVOTHROID  Take 1 tablet (50 mcg total) by mouth daily before breakfast.     loratadine 10 MG tablet  Commonly known as:  CLARITIN  Take 10 mg by mouth daily.     meclizine 25 MG tablet  Commonly known as:  ANTIVERT  Take 25 mg by mouth 3 (three) times daily as needed for dizziness.     metoprolol tartrate 25 MG tablet  Commonly known as:  LOPRESSOR  Take 1 tablet (25 mg total) by mouth 2 (two) times daily.     nitroGLYCERIN 0.2 mg/hr patch  Commonly known as:  NITRODUR - Dosed in mg/24 hr  Place 1 patch (0.2 mg total) onto the skin daily.     omeprazole 20 MG capsule  Commonly known as:  PRILOSEC  Take 1 capsule (20 mg total) by mouth daily.     potassium chloride 10 MEQ tablet  Commonly known as:  K-DUR,KLOR-CON  Take 1 tablet (10 mEq total) by mouth 2 (two) times daily.     ranolazine 500 MG 12 hr tablet  Commonly known as:  [name]  Take 1 tablet (500 mg total) by mouth daily.     REFRESH OP  Place 1 drop into both eyes daily.     rosuvastatin 10 MG tablet  Commonly known as:  CRESTOR  Take 1 tablet (10 mg total) by mouth 3 (three) times a week. PJA:[id number] Exp: 8-18     tobramycin 0.3 % ophthalmic solution  Commonly known as:  TOBREX  Place 1 drop into the right eye daily as needed (eye iritation).     traMADol 50 MG tablet  Commonly known as:  ULTRAM  Take 1 tablet (50 mg total) by mouth every 6 (six) hours as needed for moderate pain.     Vitamin D (Ergocalciferol) 50000 UNITS Caps capsule  Commonly known as:  DRISDOL  Take 50,000 Units by mouth daily.     warfarin 2.5 MG tablet  Commonly known as:  COUMADIN  Take 1 to 1.5 tablets by mouth daily as directed by coumadin clinic     zolpidem 10 MG tablet  Commonly known as:  AMBIEN  Take 5 mg by mouth at bedtime as needed. sleep        Past Medical History  Diagnosis Date  . Hypertension   . Coronary artery disease     a. 1994 s/p cabg;  b. 09/2013 MV: large, sev intensity,  partially reversible inf, apical defect, prior inf/ap infarct w/ mild peri-infarct ischemia->Med Rx.  . Legally blind   . CKD (chronic kidney disease), stage III     a. iii - iv.  . Anemia   . Hypothyroid   . Gastric ulcer   . Hiatal hernia   . Dyspepsia   . UTI (lower urinary tract infection)   . H/O: GI bleed 12//13  . High cholesterol   . Chronic combined systolic and diastolic CHF (congestive heart failure)     a. 01/2014 Echo: EF @ least mod-sev reduced with HK of lat/apical, basal inf walls. basalpost AK, Gr 1DD.  Marland Kitchen DVT of upper extremity (deep vein thrombosis) 06/13/2012    BUE  . Seasonal allergies   . Allergy to perfume   . Type II diabetes mellitus   . History of blood transfusion 2013  . GERD (gastroesophageal reflux disease)   . Daily headache   . Arthritis   . PAF (paroxysmal atrial fibrillation)     a. CHA2DS2VASc = 7-->coumadin.  . 2Nd degree atrioventricular block     a. s/p MDT ADDR01 DC PPM (ser #: [id number]).    Past Surgical History  Procedure Laterality Date  . Esophagogastroduodenoscopy  12/22/2011    Procedure: ESOPHAGOGASTRODUODENOSCOPY (EGD);  Surgeon: Gatha Mayer, MD;  Location: [facility];  Service: Endoscopy;  Laterality: N/A;  . Cardioversion  06/06/06    successful  . Insert / replace / remove pacemaker  06/29/2006    Medtronic adapta  . Cardiac catheterization  10/25/92  . Cardiac catheterization  11/18/03    w/grafts 100%CX LAD 80 & 100%  . Cardiac catheterization  01/24/05    diffuse disease of native vessels  . Cardiac catheterization  06/06/06    severe native CAD  . Coronary artery bypass graft  10/27/92    LIMA to LAD,SVG to LAD second diagonal,obtuse maraginal of the CX and posterior descendingbranch of the RCA  . Cataract extraction w/ intraocular lens  implant, bilateral Bilateral   . Refractive surgery Bilateral     Family History  Problem Relation Age of Onset  . Breast cancer Daughter   . Diabetes Son   . Diabetes Daughter     . Heart disease Father   . Diabetes Father   . Colon polyps Daughter   . Heart attack Brother   . Diabetes Brother   . Stroke Sister     Social History:  reports that she has never smoked. She has never used smokeless tobacco. She reports that she does not drink alcohol or use illicit drugs.  Allergies:  Allergies  Allergen Reactions  . Darvon     Unknown reaction   . Digoxin And Related Nausea Only  . Penicillins Other (See Comments)    Was told had allergy from childhood... Unknown reaction  . Percocet [Oxycodone-Acetaminophen] Other (See Comments)    confused  . Percodan [Oxycodone-Aspirin]   . Vicodin [Hydrocodone-Acetaminophen] Other (See Comments)    confused    REVIEW of systems:  Hypothyroidism: She is taking levothyroxine  50 mcg daily withnext to half tablet twice a week since her last visit when her TSH was high TSH is back to normal  Lab Results  Component Value Date   TSH 2.65 06/25/2014   TSH 4.519* 05/25/2014   TSH 6.25* 04/16/2014   FREET4 0.70 06/25/2014   FREET4 0.65 06/24/2013   FREET4 0.93 01/24/2013     She has had renal dysfunction which is long-standing but relatively better  recently  Lab Results  Component Value Date   CREATININE 1.46* 06/19/2014    Has had periodic pedal edema. Does wear elastic stockings  She has significant neuropathy with sensory loss on previous  foot exam.     Examination:   BP 118/70 mmHg  Pulse 62  Temp(Src) 97.8 F (36.6 C)  Resp 16  Ht _0  (1.626 m)  Wt 187 lb 6.4 oz (85.004 kg)  BMI 32.15 kg/m2  SpO2 94%  Body mass index is 32.15 kg/(m^2).    Assesment/plan:   1.  Diabetes type 2, with obesity, long-standing and now again on basal insulin.   More  recently her blood sugars appear to be coming down further especially in the morning Although her sugars have been better only recently her A1c has already come down to 7.3 when checked in the hospital last month  On her own she has taken 5 mg  glipizide before supper time instead of 2.5 and this may be improving her overnight readings also Discussed with her son that we do not need aggressive control of her diabetes especially overnight readings   Since her only tendency to high readings as after meals she can continue glipizide 5 mg before her main meal at suppertime but stop insulin for now Will not restart insulin unless her fasting blood sugars are close to 200 Meanwhile she will try to check more readings after supper, discussed blood sugar targets  2. Hypothyroidism: She has been on a  relatively low dose of 50 mcg, 8 tablets a week and her TSH is now normal    Patient Instructions  Stop insulin  Glipizide: take before supper and check some sugar at bedtime and if bedtime sugar is below 140 reduce to 1/2 tab  Check blood sugars on waking up .Marland Kitchen 2-3 .Marland Kitchen times a week Also check blood sugars about 2 hours after a meal and do this after different meals by rotation  Recommended blood sugar levels on waking up is 100-130 and about 2 hours after meal is 140-200 Please bring blood sugar monitor to each visit.      Kyjuan Gause 06/28/2014, 9:27 PM     Office Visit on 06/25/2014  Component Date Value Ref Range Status  . TSH 06/25/2014 2.65  0.35 - 4.50 uIU/mL Final  . Free T4 06/25/2014 0.70  0.60 - 1.60 ng/dL Final  Lab on 06/19/2014  Component Date Value Ref Range Status  . Sodium 06/19/2014 137  135 - 145 mEq/L Final  . Potassium 06/19/2014 4.2  3.5 - 5.1 mEq/L Final  . Chloride 06/19/2014 100  96 - 112 mEq/L Final  . CO2 06/19/2014 32  19 - 32 mEq/L Final  . Glucose, Bld 06/19/2014 191* 70 - 99 mg/dL Final  . BUN 06/19/2014 21  6 - 23 mg/dL Final  . Creatinine, Ser 06/19/2014 1.46* 0.40 - 1.20 mg/dL Final  . Calcium 06/19/2014 9.4  8.4 - 10.5 mg/dL Final  . GFR 06/19/2014 43.09* >60.00 mL/min Final

## 2014-06-25 NOTE — Patient Instructions (Addendum)
Stop insulin  Glipizide: take before supper and check some sugar at bedtime and if bedtime sugar is below 140 reduce to 1/2 tab  Check blood sugars on waking up .Marland Kitchen 2-3 .Marland Kitchen times a week Also check blood sugars about 2 hours after a meal and do this after different meals by rotation  Recommended blood sugar levels on waking up is 100-130 and about 2 hours after meal is 140-200 Please bring blood sugar monitor to each visit.

## 2014-06-26 ENCOUNTER — Encounter: Payer: Self-pay | Admitting: Gastroenterology

## 2014-06-26 ENCOUNTER — Ambulatory Visit (INDEPENDENT_AMBULATORY_CARE_PROVIDER_SITE_OTHER): Payer: Medicare Other | Admitting: Gastroenterology

## 2014-06-26 VITALS — BP 108/72 | HR 64 | Ht 64.0 in | Wt 189.0 lb

## 2014-06-26 DIAGNOSIS — R1033 Periumbilical pain: Secondary | ICD-10-CM | POA: Insufficient documentation

## 2014-06-26 NOTE — Assessment & Plan Note (Signed)
Abdominal pain appears to be abdominal wall pain related to muscle spasm.  She is on visibly described contractions suggest spasm.  Recent ultrasound CT with negative findings mitigate against an intra-abdominal process.  Recommendations #1 patient was instructed to take tonic water and to try over-the-counter medicines for leg spasms.  No further GI workup at this time.

## 2014-06-26 NOTE — Progress Notes (Signed)
      History of Present Illness:  [name] is complaining of abdominal pain consisting of spasms in her abdominal wall.  These occur spontaneously and are not related to eating.  She visibly sees spasms in her abdominal wall.  She was hospitalized last month for congestive heart failure and is taking Lasix.  She denies nausea or vomiting.  In January, 2016 she underwent CT scan and ultrasound which were not revealing.    Review of Systems: Pertinent positive and negative review of systems were noted in the above HPI section. All other review of systems were otherwise negative.    Current Medications, Allergies, Past Medical History, Past Surgical History, Family History and Social History were reviewed in [facility] record  Vital signs were reviewed in today's medical record. Physical Exam: General: Well developed elderly female examined while sitting in a wheelchair  Skin: anicteric Head: Normocephalic and atraumatic Eyes:  sclerae anicteric, EOMI Ears: Normal auditory acuity Mouth: No deformity or lesions Lymph Nodes: no lymphadenopathy Lungs: Clear throughout to auscultation Heart: Regular rate and rhythm; no murmurs, rubs or brui: Gastroinestinal:  Soft, non tender and non distended. No masses, hepatosplenomegaly or hernias noted. Normal Bowel sounds Rectal:deferred Musculoskeletal: Symmetrical with no gross deformities  Pulses:  Normal pulses noted Extremities: No clubbing, cyanosis,  or deformities noted.  There is trace pedal edema Neurological: Alert oriented x 4, grossly nonfocal Psychological:  Alert and cooperative. Normal mood and affect  See Assessment and Plan under Problem List

## 2014-06-26 NOTE — Patient Instructions (Signed)
Please purchase OTC Medication for leg cramping per Dr Deatra Ina

## 2014-06-30 ENCOUNTER — Other Ambulatory Visit: Payer: Self-pay | Admitting: Radiology

## 2014-07-09 NOTE — Progress Notes (Signed)
Cardiology Office Note   Date:  07/10/2014   ID:  [name], DOB [birth year], MRN [id number]  PCP:  Kevan Ny, MD  Cardiologist:  Dr. Sanda Klein     Chief Complaint  Patient presents with  . Congestive Heart Failure     History of Present Illness: [name] is a 80 y.o. female with a hx of chronic combined systolic and diastolic HF, CAD s/p CABG 1994, CKD, HTN, HL, prior GI bleed, PAF, 2nd degree HB s/p PPM, DM2.  She is on chronic coumadin AC. Nuclear study in 9/15 demonstrated a large partially reversible inferior-apical defect with EF 35% (high risk).  It was decided to pursue medical Rx.    She was recently admitted to [facility] 5/16 with dyspnea and acute on chronic combined systolic and diastolic heart failure. She did have minor elevation in troponin up to a peak of 0.11 and this was felt most likely to be related to demand ischemia in the setting of heart failure. Conservative therapy was recommended. She was seen by Ignacia Bayley, NP 06/05/14 in FU and Lasix was adjusted for volume excess.  I saw her in follow-up 06/12/14. Baseline weight was noted to be 180 pounds. We discussed adjusting her Lasix until she got back to 180 pounds.  She is here for follow-up today. She returns with her daughter. She has occasional chest pains. They do not seem to be getting any worse. She denies orthopnea, PND. LE edema is stable. She denies worsening shortness of breath. She does note occasional belching.   Studies/Reports Reviewed Today:  Echo 02/05/14 - Left ventricle: Poor acoiustic windows limit study. LVEF is at least moderate to severely depressed with hypokinesis of the lateral, apical, basal inferior walls The basal posterior wall appears akinetice. Anterior wall is not well seen WOuld recommlimited echo with echocontrast to further define LVEF. and wallmotion. The cavity size was normal. Wall thickness was normal.Doppler parameters are consistent with abnormal  left ventricularrelaxation (grade 1 diastolic dysfunction). - Pericardium, extracardiac: A trivial pericardial effusion was identified.  Myoview 08/29/13 Overall Impression: High risk stress nuclear study with large, severe intensity, partially reversible, inferior and apical defect consistent with prior inferior apical infarct and mild peri-infarct ischemia towards the apex; perfusion similar to previous study; study high risk due to reduced LV function.  EF 35% LV Wall Motion: Inferior and apical akinesis; LV function worse compared to previous   Past Medical History  Diagnosis Date  . Hypertension   . Coronary artery disease     a. 1994 s/p cabg;  b. 09/2013 MV: large, sev intensity, partially reversible inf, apical defect, prior inf/ap infarct w/ mild peri-infarct ischemia->Med Rx.  . Legally blind   . CKD (chronic kidney disease), stage III     a. iii - iv.  . Anemia   . Hypothyroid   . Gastric ulcer   . Hiatal hernia   . Dyspepsia   . UTI (lower urinary tract infection)   . H/O: GI bleed 12//13  . High cholesterol   . Chronic combined systolic and diastolic CHF (congestive heart failure)     a. 01/2014 Echo: EF @ least mod-sev reduced with HK of lat/apical, basal inf walls. basalpost AK, Gr 1DD.  Marland Kitchen DVT of upper extremity (deep vein thrombosis) 06/13/2012    BUE  . Seasonal allergies   . Allergy to perfume   . Type II diabetes mellitus   . History of blood transfusion 2013  . GERD (gastroesophageal  reflux disease)   . Daily headache   . Arthritis   . PAF (paroxysmal atrial fibrillation)     a. CHA2DS2VASc = 7-->coumadin.  . 2Nd degree atrioventricular block     a. s/p MDT ADDR01 DC PPM (ser #: [id number]).    Past Surgical History  Procedure Laterality Date  . Esophagogastroduodenoscopy  12/22/2011    Procedure: ESOPHAGOGASTRODUODENOSCOPY (EGD);  Surgeon: Gatha Mayer, MD;  Location: [facility];  Service: Endoscopy;  Laterality: N/A;  . Cardioversion  06/06/06     successful  . Insert / replace / remove pacemaker  06/29/2006    Medtronic adapta  . Cardiac catheterization  10/25/92  . Cardiac catheterization  11/18/03    w/grafts 100%CX LAD 80 & 100%  . Cardiac catheterization  01/24/05    diffuse disease of native vessels  . Cardiac catheterization  06/06/06    severe native CAD  . Coronary artery bypass graft  10/27/92    LIMA to LAD,SVG to LAD second diagonal,obtuse maraginal of the CX and posterior descendingbranch of the RCA  . Cataract extraction w/ intraocular lens  implant, bilateral Bilateral   . Refractive surgery Bilateral      Current Outpatient Prescriptions  Medication Sig Dispense Refill  . ACCU-CHEK AVIVA PLUS test strip Use to check blood sugars 2 times daily 200 each 1  . aspirin 81 MG chewable tablet Chew 81 mg by mouth daily.    Marland Kitchen atropine 1 % ophthalmic solution Place 1 drop into the right eye 4 (four) times daily.   0  . Blood Glucose Monitoring Suppl (ACCU-CHEK AVIVA PLUS) W/DEVICE KIT Use to check blood sugar 2 times per day dx code 250.00 1 kit 0  . feeding supplement, GLUCERNA SHAKE, (GLUCERNA SHAKE) LIQD Take 237 mLs by mouth 2 (two) times daily between meals.    . folic acid (FOLVITE) 1 MG tablet Take 1 tablet (1 mg total) by mouth daily. 90 tablet 3  . furosemide (LASIX) 40 MG tablet Take 1 tablet (40 mg total) by mouth 2 (two) times daily.    Marland Kitchen gabapentin (NEURONTIN) 100 MG capsule Take 100 mg by mouth 2 (two) times daily.     Marland Kitchen glipiZIDE (GLUCOTROL) 5 MG tablet Take one-half tablet by  mouth daily only if blood  sugar is over 200 (Patient taking differently: Take 2.5 mg by mouth at bedtime. ) 45 tablet 1  . Insulin Glargine (LANTUS SOLOSTAR) 100 UNIT/ML Solostar Pen Inject 6 Units into the skin every morning. 5 pen 1  . Insulin Pen Needle (BD PEN NEEDLE NANO U/F) 32G X 4 MM MISC Use one per day to inject insulin 30 each 5  . Lancets (ACCU-CHEK MULTICLIX) lancets Use as instructed to check blood sugar 2 times per day dx  code 250.00 200 each 1  . Lancets Misc. (ACCU-CHEK FASTCLIX LANCET) KIT Use as directec 1 kit 1  . levothyroxine (SYNTHROID, LEVOTHROID) 50 MCG tablet Take 1 tablet (50 mcg total) by mouth daily before breakfast. 30 tablet 1  . loratadine (CLARITIN) 10 MG tablet Take 10 mg by mouth daily.    . meclizine (ANTIVERT) 25 MG tablet Take 25 mg by mouth 3 (three) times daily as needed for dizziness.    . metoprolol tartrate (LOPRESSOR) 25 MG tablet Take 1 tablet (25 mg total) by mouth 2 (two) times daily. (Patient taking differently: Take 25 mg by mouth as needed. ) 90 tablet 6  . nitroGLYCERIN (NITRODUR - DOSED IN MG/24 HR) 0.2 mg/hr patch Place  1 patch (0.2 mg total) onto the skin daily. 30 patch 6  . omeprazole (PRILOSEC) 20 MG capsule Take 1 capsule (20 mg total) by mouth daily. 90 capsule 3  . Polyvinyl Alcohol-Povidone (REFRESH OP) Place 1 drop into both eyes daily.     . ranolazine ([name]) 500 MG 12 hr tablet Take 1 tablet (500 mg total) by mouth 2 (two) times daily. 60 tablet 11  . rosuvastatin (CRESTOR) 10 MG tablet Take 1 tablet (10 mg total) by mouth 3 (three) times a week. MCN:[id number] Exp: 8-18 21 tablet 0  . tobramycin (TOBREX) 0.3 % ophthalmic solution Place 1 drop into the right eye daily as needed (eye iritation).     . traMADol (ULTRAM) 50 MG tablet Take 1 tablet (50 mg total) by mouth every 6 (six) hours as needed for moderate pain. 30 tablet 0  . Vitamin D, Ergocalciferol, (DRISDOL) 50000 UNITS CAPS capsule Take 50,000 Units by mouth daily.     Marland Kitchen warfarin (COUMADIN) 2.5 MG tablet Take 1 to 1.5 tablets by mouth daily as directed by coumadin clinic (Patient taking differently: Take 2.5-3.75 mg by mouth See admin instructions. Take 1 tablet on Sunday and Wednesday. Take 1.5 tablets all other days.) 40 tablet 2  . zolpidem (AMBIEN) 10 MG tablet Take 5 mg by mouth at bedtime as needed. sleep    . potassium chloride (K-DUR,KLOR-CON) 10 MEQ tablet Take 1 tablet (10 mEq total) by mouth 2 (two)  times daily. 60 tablet 6   No current facility-administered medications for this visit.    Allergies:   Darvon; Digoxin and related; Penicillins; Percocet; Percodan; and Vicodin    Social History:  The patient  reports that she has never smoked. She has never used smokeless tobacco. She reports that she does not drink alcohol or use illicit drugs.   Family History:  The patient's family history includes Breast cancer in her daughter; Colon polyps in her daughter; Diabetes in her brother, daughter, father, and son; Heart attack in her brother; Heart disease in her father; Stroke in her sister.    ROS:   Please see the history of present illness.   Review of Systems  Constitution: Positive for diaphoresis, fever and malaise/fatigue.  Cardiovascular: Positive for chest pain.  Gastrointestinal: Positive for abdominal pain, nausea and vomiting.  Neurological: Positive for dizziness.  All other systems reviewed and are negative.     PHYSICAL EXAM: VS:  BP 120/60 mmHg  Pulse 68  Ht $R'5\' 4"'WQ$  (1.626 m)  Wt 189 lb (85.73 kg)  BMI 32.43 kg/m2  SpO2 95%    Wt Readings from Last 3 Encounters:  07/10/14 189 lb (85.73 kg)  06/26/14 189 lb (85.73 kg)  06/25/14 187 lb 6.4 oz (85.004 kg)     GEN: Well nourished, well developed, in no acute distress HEENT: normal Neck: no JVD at 90 degrees, no masses Cardiac:  Normal S1/S2, RRR; no murmur, no rubs or gallops, trace bilateral LE edema    Respiratory:  clear to auscultation bilaterally, no wheezing, rhonchi or rales; crackles at bases cleared with cough. GI: soft, nontender, nondistended, + BS MS: no deformity or atrophy Skin: warm and dry  Neuro:  CNs II-XII intact, Strength and sensation are intact Psych: Normal affect   EKG:  EKG is not ordered today.  It demonstrates:  N/a   Recent Labs: 02/12/2014: ALT 69* 05/24/2014: B Natriuretic Peptide 169.5* 05/26/2014: Hemoglobin 11.0*; Platelets 228 06/19/2014: BUN 21; Creatinine, Ser 1.46*;  Potassium 4.2; Sodium 137  06/25/2014: TSH 2.65    Lipid Panel    Component Value Date/Time   CHOL 170 10/24/2013 1006   TRIG 201.0* 10/24/2013 1006   HDL 30.50* 10/24/2013 1006   CHOLHDL 6 10/24/2013 1006   VLDL 40.2* 10/24/2013 1006   LDLCALC 61 06/24/2013 1127   LDLDIRECT 92.3 10/24/2013 1006      ASSESSMENT AND PLAN:  Chronic combined systolic and diastolic CHF (congestive heart failure): Volume overall appears stable. Continue current therapy.  Coronary artery disease involving native coronary artery of native heart with unspecified angina pectoris:  She has fairly stable angina.  Continue current dose of NTG patch, ASA, beta-blocker, statin.  She currently takes [name] once daily. She can increase this to 500 mg twice a day.  Essential hypertension:  BP controlled.    CKD (chronic kidney disease), stage III -follow-up BMET today.  Complete heart block s/p Pacemaker:  FU with EP as planned.   Hyperlipidemia:  Continue statin.    Medication Adjustments: Current medicines are reviewed at length with the patient today.  Concerns regarding medicines are as outlined above.  The following changes have been made:   Discontinued Medications   No medications on file   Modified Medications   Modified Medication Previous Medication   POTASSIUM CHLORIDE (K-DUR,KLOR-CON) 10 MEQ TABLET potassium chloride (K-DUR,KLOR-CON) 10 MEQ tablet      Take 1 tablet (10 mEq total) by mouth 2 (two) times daily.    Take 1 tablet (10 mEq total) by mouth 2 (two) times daily.   RANOLAZINE ([name]) 500 MG 12 HR TABLET ranolazine ([name]) 500 MG 12 hr tablet      Take 1 tablet (500 mg total) by mouth 2 (two) times daily.    Take 1 tablet (500 mg total) by mouth daily.   New Prescriptions   No medications on file     Labs/ tests ordered today include:  Orders Placed This Encounter  Procedures  . Basic Metabolic Panel (BMET)      Disposition:   FU with Dr. Dani Gobble Croitoru 3  months   Signed, Richardson Dopp, PA-C, MHS 07/10/2014 1:35 PM    [facility] [place], [place], [place]  [address] Phone: [phone]; Fax: [phone]

## 2014-07-10 ENCOUNTER — Ambulatory Visit (INDEPENDENT_AMBULATORY_CARE_PROVIDER_SITE_OTHER): Payer: Medicare Other | Admitting: Physician Assistant

## 2014-07-10 ENCOUNTER — Telehealth: Payer: Self-pay | Admitting: *Deleted

## 2014-07-10 ENCOUNTER — Encounter: Payer: Self-pay | Admitting: Physician Assistant

## 2014-07-10 ENCOUNTER — Other Ambulatory Visit: Payer: Self-pay | Admitting: Cardiovascular Disease

## 2014-07-10 VITALS — BP 120/60 | HR 68 | Ht 64.0 in | Wt 189.0 lb

## 2014-07-10 DIAGNOSIS — N183 Chronic kidney disease, stage 3 unspecified: Secondary | ICD-10-CM

## 2014-07-10 DIAGNOSIS — E785 Hyperlipidemia, unspecified: Secondary | ICD-10-CM

## 2014-07-10 DIAGNOSIS — I5032 Chronic diastolic (congestive) heart failure: Secondary | ICD-10-CM

## 2014-07-10 DIAGNOSIS — I5042 Chronic combined systolic (congestive) and diastolic (congestive) heart failure: Secondary | ICD-10-CM

## 2014-07-10 DIAGNOSIS — I10 Essential (primary) hypertension: Secondary | ICD-10-CM

## 2014-07-10 DIAGNOSIS — I25119 Atherosclerotic heart disease of native coronary artery with unspecified angina pectoris: Secondary | ICD-10-CM | POA: Diagnosis not present

## 2014-07-10 DIAGNOSIS — Z950 Presence of cardiac pacemaker: Secondary | ICD-10-CM

## 2014-07-10 DIAGNOSIS — I442 Atrioventricular block, complete: Secondary | ICD-10-CM

## 2014-07-10 LAB — BASIC METABOLIC PANEL
BUN: 23 mg/dL (ref 6–23)
CALCIUM: 9.5 mg/dL (ref 8.4–10.5)
CO2: 34 meq/L — AB (ref 19–32)
CREATININE: 1.51 mg/dL — AB (ref 0.40–1.20)
Chloride: 98 mEq/L (ref 96–112)
GFR: 41.45 mL/min — ABNORMAL LOW (ref >60.00)
Glucose, Bld: 209 mg/dL — ABNORMAL HIGH (ref 70–99)
Potassium: 3.9 mEq/L (ref 3.5–5.1)
SODIUM: 137 meq/L (ref 135–145)

## 2014-07-10 MED ORDER — RANOLAZINE ER 500 MG PO TB12: 500.0000 mg | ORAL_TABLET | Freq: Two times a day (BID) | ORAL | Status: DC

## 2014-07-10 MED ORDER — POTASSIUM CHLORIDE CRYS ER 10 MEQ PO TBCR: 10.0000 meq | EXTENDED_RELEASE_TABLET | Freq: Two times a day (BID) | ORAL | Status: DC

## 2014-07-10 NOTE — Telephone Encounter (Signed)
S/w pt's son who is caregiver for pt. I advised Dr. Angelena Form (DOD) reviewed lab results today and said kidney function stable; K+ normal, continue current tx plan. Son said thank you.

## 2014-07-10 NOTE — Patient Instructions (Addendum)
Medication Instructions:  INCREASE [name] 500 MG TWICE DAILY; MAKE SURE SPACE 12 HOURS APART  Labwork: TODAY  BMET  Testing/Procedures: NONE  Follow-Up: 3 MONTHS WITH DR/ CROITORU OR HIS PA/NP   Any Other Special Instructions Will Be Listed Below (If Applicable).   Low-Sodium Eating Plan Sodium raises blood pressure and causes water to be held in the body. Getting less sodium from food will help lower your blood pressure, reduce any swelling, and protect your heart, liver, and kidneys. We get sodium by adding salt (sodium chloride) to food. Most of our sodium comes from canned, boxed, and frozen foods. Restaurant foods, fast foods, and pizza are also very high in sodium. Even if you take medicine to lower your blood pressure or to reduce fluid in your body, getting less sodium from your food is important. WHAT IS MY PLAN? Most people should limit their sodium intake to 2,300 mg a day. Your health care provider recommends that you limit your sodium intake to __________ a day.  WHAT DO I NEED TO KNOW ABOUT THIS EATING PLAN? For the low-sodium eating plan, you will follow these general guidelines:  Choose foods with a % Daily Value for sodium of less than 5% (as listed on the food label).   Use salt-free seasonings or herbs instead of table salt or sea salt.   Check with your health care provider or pharmacist before using salt substitutes.   Eat fresh foods.  Eat more vegetables and fruits.  Limit canned vegetables. If you do use them, rinse them well to decrease the sodium.   Limit cheese to 1 oz (28 g) per day.   Eat lower-sodium products, often labeled as "lower sodium" or "no salt added."  Avoid foods that contain monosodium glutamate (MSG). MSG is sometimes added to [place] food and some canned foods.  Check food labels (Nutrition Facts labels) on foods to learn how much sodium is in one serving.  Eat more home-cooked food and less restaurant, buffet, and fast  food.  When eating at a restaurant, ask that your food be prepared with less salt or none, if possible.  HOW DO I READ FOOD LABELS FOR SODIUM INFORMATION? The Nutrition Facts label lists the amount of sodium in one serving of the food. If you eat more than one serving, you must multiply the listed amount of sodium by the number of servings. Food labels may also identify foods as:  Sodium free--Less than 5 mg in a serving.  Very low sodium--35 mg or less in a serving.  Low sodium--140 mg or less in a serving.  Light in sodium--50% less sodium in a serving. For example, if a food that usually has 300 mg of sodium is changed to become light in sodium, it will have 150 mg of sodium.  Reduced sodium--25% less sodium in a serving. For example, if a food that usually has 400 mg of sodium is changed to reduced sodium, it will have 300 mg of sodium. WHAT FOODS CAN I EAT? Grains Low-sodium cereals, including oats, puffed wheat and rice, and shredded wheat cereals. Low-sodium crackers. Unsalted rice and pasta. Lower-sodium bread.  Vegetables Frozen or fresh vegetables. Low-sodium or reduced-sodium canned vegetables. Low-sodium or reduced-sodium tomato sauce and paste. Low-sodium or reduced-sodium tomato and vegetable juices.  Fruits Fresh, frozen, and canned fruit. Fruit juice.  Meat and Other Protein Products Low-sodium canned tuna and salmon. Fresh or frozen meat, poultry, seafood, and fish. Lamb. Unsalted nuts. Dried beans, peas, and lentils without added  salt. Unsalted canned beans. Homemade soups without salt. Eggs.  Dairy Milk. Soy milk. Ricotta cheese. Low-sodium or reduced-sodium cheeses. Yogurt.  Condiments Fresh and dried herbs and spices. Salt-free seasonings. Onion and garlic powders. Low-sodium varieties of mustard and ketchup. Lemon juice.  Fats and Oils Reduced-sodium salad dressings. Unsalted butter.  Other Unsalted popcorn and pretzels.  The items listed above may  not be a complete list of recommended foods or beverages. Contact your dietitian for more options. WHAT FOODS ARE NOT RECOMMENDED? Grains Instant hot cereals. Bread stuffing, pancake, and biscuit mixes. Croutons. Seasoned rice or pasta mixes. Noodle soup cups. Boxed or frozen macaroni and cheese. Self-rising flour. Regular salted crackers. Vegetables Regular canned vegetables. Regular canned tomato sauce and paste. Regular tomato and vegetable juices. Frozen vegetables in sauces. Salted french fries. Olives. Angie Fava. Relishes. Sauerkraut. Salsa. Meat and Other Protein Products Salted, canned, smoked, spiced, or pickled meats, seafood, or fish. Bacon, ham, sausage, hot dogs, corned beef, chipped beef, and packaged luncheon meats. Salt pork. Jerky. Pickled herring. Anchovies, regular canned tuna, and sardines. Salted nuts. Dairy Processed cheese and cheese spreads. Cheese curds. Blue cheese and cottage cheese. Buttermilk.  Condiments Onion and garlic salt, seasoned salt, table salt, and sea salt. Canned and packaged gravies. Worcestershire sauce. Tartar sauce. Barbecue sauce. Teriyaki sauce. Soy sauce, including reduced sodium. Steak sauce. Fish sauce. Oyster sauce. Cocktail sauce. Horseradish. Regular ketchup and mustard. Meat flavorings and tenderizers. Bouillon cubes. Hot sauce. Tabasco sauce. Marinades. Taco seasonings. Relishes. Fats and Oils Regular salad dressings. Salted butter. Margarine. Ghee. Bacon fat.  Other Potato and tortilla chips. Corn chips and puffs. Salted popcorn and pretzels. Canned or dried soups. Pizza. Frozen entrees and pot pies.  The items listed above may not be a complete list of foods and beverages to avoid. Contact your dietitian for more information. Document Released: 06/24/2001 Document Revised: 01/07/2013 Document Reviewed: 11/06/2012 [facility] Patient Information 2015 [place], [place]. This information is not intended to replace advice given to you by your  health care provider. Make sure you discuss any questions you have with your health care provider.

## 2014-07-13 ENCOUNTER — Other Ambulatory Visit: Payer: Self-pay | Admitting: Endocrinology

## 2014-07-13 ENCOUNTER — Other Ambulatory Visit: Payer: Self-pay | Admitting: Cardiovascular Disease

## 2014-07-14 NOTE — Telephone Encounter (Signed)
Rx(s) sent to pharmacy electronically.  

## 2014-07-21 ENCOUNTER — Telehealth: Payer: Self-pay | Admitting: Cardiovascular Disease

## 2014-07-21 ENCOUNTER — Emergency Department (HOSPITAL_COMMUNITY): Payer: Medicare Other

## 2014-07-21 ENCOUNTER — Encounter (HOSPITAL_COMMUNITY): Payer: Self-pay | Admitting: Emergency Medicine

## 2014-07-21 ENCOUNTER — Emergency Department (HOSPITAL_COMMUNITY)
Admission: EM | Admit: 2014-07-21 | Discharge: 2014-07-21 | Disposition: A | Discharge status: Home / Self Care | Payer: Medicare Other | Attending: Emergency Medicine | Admitting: Emergency Medicine

## 2014-07-21 DIAGNOSIS — Z7901 Long term (current) use of anticoagulants: Secondary | ICD-10-CM | POA: Diagnosis not present

## 2014-07-21 DIAGNOSIS — K219 Gastro-esophageal reflux disease without esophagitis: Secondary | ICD-10-CM | POA: Insufficient documentation

## 2014-07-21 DIAGNOSIS — Z86718 Personal history of other venous thrombosis and embolism: Secondary | ICD-10-CM | POA: Diagnosis not present

## 2014-07-21 DIAGNOSIS — N183 Chronic kidney disease, stage 3 (moderate): Secondary | ICD-10-CM | POA: Diagnosis not present

## 2014-07-21 DIAGNOSIS — R079 Chest pain, unspecified: Secondary | ICD-10-CM | POA: Diagnosis present

## 2014-07-21 DIAGNOSIS — K259 Gastric ulcer, unspecified as acute or chronic, without hemorrhage or perforation: Secondary | ICD-10-CM | POA: Diagnosis not present

## 2014-07-21 DIAGNOSIS — E039 Hypothyroidism, unspecified: Secondary | ICD-10-CM | POA: Insufficient documentation

## 2014-07-21 DIAGNOSIS — Z87440 Personal history of urinary (tract) infections: Secondary | ICD-10-CM | POA: Insufficient documentation

## 2014-07-21 DIAGNOSIS — I5042 Chronic combined systolic (congestive) and diastolic (congestive) heart failure: Secondary | ICD-10-CM | POA: Diagnosis not present

## 2014-07-21 DIAGNOSIS — E860 Dehydration: Secondary | ICD-10-CM

## 2014-07-21 DIAGNOSIS — I2510 Atherosclerotic heart disease of native coronary artery without angina pectoris: Secondary | ICD-10-CM | POA: Insufficient documentation

## 2014-07-21 DIAGNOSIS — M1990 Unspecified osteoarthritis, unspecified site: Secondary | ICD-10-CM | POA: Insufficient documentation

## 2014-07-21 DIAGNOSIS — Z794 Long term (current) use of insulin: Secondary | ICD-10-CM | POA: Diagnosis not present

## 2014-07-21 DIAGNOSIS — Z9889 Other specified postprocedural states: Secondary | ICD-10-CM | POA: Diagnosis not present

## 2014-07-21 DIAGNOSIS — R410 Disorientation, unspecified: Secondary | ICD-10-CM | POA: Diagnosis not present

## 2014-07-21 DIAGNOSIS — E780 Pure hypercholesterolemia: Secondary | ICD-10-CM | POA: Insufficient documentation

## 2014-07-21 DIAGNOSIS — I129 Hypertensive chronic kidney disease with stage 1 through stage 4 chronic kidney disease, or unspecified chronic kidney disease: Secondary | ICD-10-CM | POA: Insufficient documentation

## 2014-07-21 DIAGNOSIS — Z79899 Other long term (current) drug therapy: Secondary | ICD-10-CM | POA: Insufficient documentation

## 2014-07-21 DIAGNOSIS — H548 Legal blindness, as defined in USA: Secondary | ICD-10-CM | POA: Insufficient documentation

## 2014-07-21 DIAGNOSIS — E119 Type 2 diabetes mellitus without complications: Secondary | ICD-10-CM | POA: Insufficient documentation

## 2014-07-21 DIAGNOSIS — Z7982 Long term (current) use of aspirin: Secondary | ICD-10-CM | POA: Insufficient documentation

## 2014-07-21 DIAGNOSIS — Z951 Presence of aortocoronary bypass graft: Secondary | ICD-10-CM | POA: Insufficient documentation

## 2014-07-21 DIAGNOSIS — I480 Paroxysmal atrial fibrillation: Secondary | ICD-10-CM | POA: Insufficient documentation

## 2014-07-21 DIAGNOSIS — D649 Anemia, unspecified: Secondary | ICD-10-CM | POA: Insufficient documentation

## 2014-07-21 DIAGNOSIS — Z880 Allergy status to penicillin: Secondary | ICD-10-CM | POA: Insufficient documentation

## 2014-07-21 LAB — BASIC METABOLIC PANEL
ANION GAP: 11 (ref 5–15)
BUN: 21 mg/dL — ABNORMAL HIGH (ref 6–20)
CHLORIDE: 94 mmol/L — AB (ref 101–111)
CO2: 27 mmol/L (ref 22–32)
CREATININE: 1.64 mg/dL — AB (ref 0.44–1.00)
Calcium: 9.2 mg/dL (ref 8.9–10.3)
GFR calc Af Amer: 30 mL/min — ABNORMAL LOW (ref >60)
GFR calc non Af Amer: 26 mL/min — ABNORMAL LOW (ref >60)
GLUCOSE: 250 mg/dL — AB (ref 65–99)
Potassium: 4.2 mmol/L (ref 3.5–5.1)
Sodium: 132 mmol/L — ABNORMAL LOW (ref 135–145)

## 2014-07-21 LAB — CBC
HCT: 38.9 % (ref 36.0–46.0)
HEMOGLOBIN: 12.7 g/dL (ref 12.0–15.0)
MCH: 28.5 pg (ref 26.0–34.0)
MCHC: 32.6 g/dL (ref 30.0–36.0)
MCV: 87.2 fL (ref 78.0–100.0)
Platelets: 179 10*3/uL (ref 150–400)
RBC: 4.46 MIL/uL (ref 3.87–5.11)
RDW: 13.9 % (ref 11.5–15.5)
WBC: 6.5 10*3/uL (ref 4.0–10.5)

## 2014-07-21 LAB — URINALYSIS W MICROSCOPIC (NOT AT ARMC)
BILIRUBIN URINE: NEGATIVE
Glucose, UA: 100 mg/dL — AB
Hgb urine dipstick: NEGATIVE
KETONES UR: NEGATIVE mg/dL
LEUKOCYTES UA: NEGATIVE
Nitrite: NEGATIVE
PROTEIN: NEGATIVE mg/dL
Specific Gravity, Urine: 1.012 (ref 1.005–1.030)
Urobilinogen, UA: 0.2 mg/dL (ref 0.0–1.0)
pH: 6.5 (ref 5.0–8.0)

## 2014-07-21 LAB — BRAIN NATRIURETIC PEPTIDE: B Natriuretic Peptide: 261.6 pg/mL — ABNORMAL HIGH (ref 0.0–100.0)

## 2014-07-21 LAB — I-STAT TROPONIN, ED: Troponin i, poc: 0.01 ng/mL (ref 0.00–0.08)

## 2014-07-21 LAB — AMMONIA: Ammonia: 20 umol/L (ref 9–35)

## 2014-07-21 NOTE — Telephone Encounter (Signed)
Pt's daughter is calling in stating that over the weekend the pt was having some episodes of confusion and she was instructed to call in when moments like these occurred with her. Please f/u with the pt's daughter [name].  Thanks

## 2014-07-21 NOTE — ED Notes (Signed)
In and out cath performed by Reyes Ivan, NT

## 2014-07-21 NOTE — ED Provider Notes (Signed)
CSN: [id number]     Arrival date & time 07/21/14  1535 History   First MD Initiated Contact with Patient 07/21/14 1609     Chief Complaint  Patient presents with  . Chest Pain  . Abdominal Pain     (Consider location/radiation/quality/duration/timing/severity/associated sxs/prior Treatment) HPI   [name] is a 81 y.o. female who presents for evaluation with his feeling, what she previously had in January when she had a urinary tract infection. There's been no fever, chills, nausea or vomiting. Patient has mild, general achiness, and has noticed some chest discomfort associated with the general achiness. She has been eating well and is hungry now. She has not seen her PCP recently. There are no other known modifying factors.  Past Medical History  Diagnosis Date  . Hypertension   . Coronary artery disease     a. 1994 s/p cabg;  b. 09/2013 MV: large, sev intensity, partially reversible inf, apical defect, prior inf/ap infarct w/ mild peri-infarct ischemia->Med Rx.  . Legally blind   . CKD (chronic kidney disease), stage III     a. iii - iv.  . Anemia   . Hypothyroid   . Gastric ulcer   . Hiatal hernia   . Dyspepsia   . UTI (lower urinary tract infection)   . H/O: GI bleed 12//13  . High cholesterol   . Chronic combined systolic and diastolic CHF (congestive heart failure)     a. 01/2014 Echo: EF @ least mod-sev reduced with HK of lat/apical, basal inf walls. basalpost AK, Gr 1DD.  Marland Kitchen DVT of upper extremity (deep vein thrombosis) 06/13/2012    BUE  . Seasonal allergies   . Allergy to perfume   . Type II diabetes mellitus   . History of blood transfusion 2013  . GERD (gastroesophageal reflux disease)   . Daily headache   . Arthritis   . PAF (paroxysmal atrial fibrillation)     a. CHA2DS2VASc = 7-->coumadin.  . 2Nd degree atrioventricular block     a. s/p MDT ADDR01 DC PPM (ser #: [id number]).   Past Surgical History  Procedure Laterality Date  .  Esophagogastroduodenoscopy  12/22/2011    Procedure: ESOPHAGOGASTRODUODENOSCOPY (EGD);  Surgeon: Iva Boop, MD;  Location: [facility];  Service: Endoscopy;  Laterality: N/A;  . Cardioversion  06/06/06    successful  . Insert / replace / remove pacemaker  06/29/2006    Medtronic adapta  . Cardiac catheterization  10/25/92  . Cardiac catheterization  11/18/03    w/grafts 100%CX LAD 80 & 100%  . Cardiac catheterization  01/24/05    diffuse disease of native vessels  . Cardiac catheterization  06/06/06    severe native CAD  . Coronary artery bypass graft  10/27/92    LIMA to LAD,SVG to LAD second diagonal,obtuse maraginal of the CX and posterior descendingbranch of the RCA  . Cataract extraction w/ intraocular lens  implant, bilateral Bilateral   . Refractive surgery Bilateral    Family History  Problem Relation Age of Onset  . Breast cancer Daughter   . Diabetes Son   . Diabetes Daughter   . Heart disease Father   . Diabetes Father   . Colon polyps Daughter   . Heart attack Brother   . Diabetes Brother   . Stroke Sister    History  Substance Use Topics  . Smoking status: Never Smoker   . Smokeless tobacco: Never Used  . Alcohol Use: No   OB History  No data available     Review of Systems  All other systems reviewed and are negative.     Allergies  Darvon; Digoxin and related; Penicillins; Percocet; Percodan; and Vicodin  Home Medications   Prior to Admission medications   Medication Sig Start Date End Date Taking? Authorizing Provider  aspirin 81 MG chewable tablet Chew 81 mg by mouth daily.   Yes Historical Provider, MD  atropine 1 % ophthalmic solution Place 1 drop into the right eye 4 (four) times daily.  10/29/13  Yes Historical Provider, MD  ciprofloxacin (CIPRO) 250 MG tablet Take 250 mg by mouth 2 (two) times daily. 07/17/14  Yes Historical Provider, MD  donepezil (ARICEPT) 5 MG tablet Take 5 mg by mouth at bedtime.  07/13/14  Yes Historical Provider, MD   feeding supplement, GLUCERNA SHAKE, (GLUCERNA SHAKE) LIQD Take 237 mLs by mouth daily.    Yes Historical Provider, MD  folic acid (FOLVITE) 1 MG tablet Take 1 tablet (1 mg total) by mouth daily. 01/19/14  Yes Mihai Croitoru, MD  furosemide (LASIX) 40 MG tablet Take 1 tablet (40 mg total) by mouth 2 (two) times daily. 06/17/14  Yes Liliane Shi, PA-C  gabapentin (NEURONTIN) 100 MG capsule Take 100 mg by mouth 2 (two) times daily.    Yes Historical Provider, MD  glipiZIDE (GLUCOTROL) 5 MG tablet Take one-half tablet by  mouth daily only if blood  sugar is over 200 Patient taking differently: Take 5 mg by mouth at bedtime.  05/18/14  Yes Elayne Snare, MD  levothyroxine (SYNTHROID, LEVOTHROID) 50 MCG tablet Take 1 tablet (50 mcg total) by mouth daily before breakfast. 06/19/14  Yes Elayne Snare, MD  loratadine (CLARITIN) 10 MG tablet Take 10 mg by mouth daily.   Yes Historical Provider, MD  meclizine (ANTIVERT) 25 MG tablet Take 25 mg by mouth 3 (three) times daily as needed for dizziness.   Yes Historical Provider, MD  metoprolol tartrate (LOPRESSOR) 25 MG tablet Take 1 tablet (25 mg total) by mouth 2 (two) times daily. Patient taking differently: Take 25 mg by mouth as needed.  02/07/14  Yes Reyne Dumas, MD  nitroGLYCERIN (NITRODUR - DOSED IN MG/24 HR) 0.2 mg/hr patch Place 1 patch (0.2 mg total) onto the skin daily. 02/12/14  Yes Doreene Burke Kilroy, PA-C  omeprazole (PRILOSEC) 20 MG capsule Take 1 capsule by mouth  daily 07/14/14  Yes Mihai Croitoru, MD  Polyvinyl Alcohol-Povidone (REFRESH OP) Place 1 drop into both eyes daily.    Yes Historical Provider, MD  potassium chloride (K-DUR,KLOR-CON) 10 MEQ tablet Take 1 tablet (10 mEq total) by mouth 2 (two) times daily. 07/10/14  Yes Mihai Croitoru, MD  ranolazine ([name]) 500 MG 12 hr tablet Take 1 tablet (500 mg total) by mouth 2 (two) times daily. 07/10/14  Yes Liliane Shi, PA-C  rosuvastatin (CRESTOR) 10 MG tablet Take 1 tablet (10 mg total) by mouth 3 (three)  times a week. [id number] Exp: 8-18 Patient taking differently: Take 10 mg by mouth every Monday, Wednesday, and Friday.  02/18/14  Yes Luke K Kilroy, PA-C  tobramycin (TOBREX) 0.3 % ophthalmic solution Place 1 drop into the right eye daily as needed (eye iritation).  10/18/12  Yes Historical Provider, MD  traMADol (ULTRAM) 50 MG tablet Take 1 tablet (50 mg total) by mouth every 6 (six) hours as needed for moderate pain. 05/14/14  Yes Mihai Croitoru, MD  Vitamin D, Ergocalciferol, (DRISDOL) 50000 UNITS CAPS capsule Take 1,000 Units by mouth daily.  Yes Historical Provider, MD  warfarin (COUMADIN) 2.5 MG tablet Take 1 to 1.5 tablets by mouth daily as directed by coumadin clinic Patient taking differently: Take 2.5-3.75 mg by mouth See admin instructions. Take 1 tablet on Sunday and Wednesday. Take 1.5 tablets all other days. 02/09/14  Yes Reyne Dumas, MD  zolpidem (AMBIEN) 10 MG tablet Take 5 mg by mouth at bedtime. sleep   Yes Historical Provider, MD  ACCU-CHEK AVIVA PLUS test strip Use to check blood sugars  two times daily 07/14/14   Elayne Snare, MD  Blood Glucose Monitoring Suppl (ACCU-CHEK AVIVA PLUS) W/DEVICE KIT Check blood sugar two times daily 07/14/14   Elayne Snare, MD  Insulin Glargine (LANTUS SOLOSTAR) 100 UNIT/ML Solostar Pen Inject 6 Units into the skin every morning. Patient not taking: Reported on 07/21/2014 05/29/14   Velvet Bathe, MD  Insulin Pen Needle (BD PEN NEEDLE NANO U/F) 32G X 4 MM MISC Use one per day to inject insulin 06/09/14   Elayne Snare, MD  Lancets (ACCU-CHEK MULTICLIX) lancets Use as instructed to check blood sugar 2 times per day dx code 250.00 07/30/13   Elayne Snare, MD  Lancets Misc. (ACCU-CHEK FASTCLIX LANCET) KIT Use as directec 03/17/13   Elayne Snare, MD   BP 132/66 mmHg  Pulse 76  Temp(Src) 98.9 F (37.2 C) (Oral)  Resp 17  Ht $R'5\' 4"'Tp$  (1.626 m)  Wt 173 lb (78.472 kg)  BMI 29.68 kg/m2  SpO2 97% Physical Exam  Constitutional: She appears well-developed. No distress.   Elderly, frail  HENT:  Head: Normocephalic and atraumatic.  Right Ear: External ear normal.  Left Ear: External ear normal.  Eyes: Conjunctivae and EOM are normal. Pupils are equal, round, and reactive to light.  Neck: Normal range of motion and phonation normal. Neck supple.  Cardiovascular: Normal rate, regular rhythm and normal heart sounds.   Pulmonary/Chest: Effort normal and breath sounds normal. She exhibits no bony tenderness.  Abdominal: Soft. There is no tenderness.  Musculoskeletal: Normal range of motion.  Neurological: She is alert. No cranial nerve deficit or sensory deficit. She exhibits normal muscle tone. Coordination normal.  No dysarthria and aphasia or nystagmus.  Skin: Skin is warm, dry and intact.  Psychiatric: She has a normal mood and affect. Her behavior is normal.  Nursing note and vitals reviewed.   ED Course  Procedures (including critical care time)  Medications - No data to display  Patient Vitals for the past 24 hrs:  BP Temp Temp src Pulse Resp SpO2 Height Weight  07/21/14 2115 132/66 mmHg - - 76 17 97 % - -  07/21/14 2030 117/67 mmHg - - 75 19 95 % - -  07/21/14 2024 111/70 mmHg - - 76 21 100 % - -  07/21/14 2023 - - - 74 - 97 % - -  07/21/14 2022 111/70 mmHg - - - - - - -  07/21/14 1745 118/83 mmHg - - 72 17 98 % - -  07/21/14 1738 (!) 105/52 mmHg - - 75 16 99 % - -  07/21/14 1715 (!) 117/42 mmHg - - 73 18 98 % - -  07/21/14 1700 (!) 113/47 mmHg - - 74 13 95 % - -  07/21/14 1630 (!) 102/52 mmHg - - 71 15 95 % - -  07/21/14 1615 110/63 mmHg - - 72 12 97 % - -  07/21/14 1608 107/63 mmHg 98.9 F (37.2 C) Oral 73 16 98 % - -  07/21/14 1550 98/55 mmHg 98  F (36.7 C) Oral 80 16 98 % $Re'5\' 4"'ngg$  (1.626 m) 173 lb (78.472 kg)  07/21/14 1548 - 98 F (36.7 C) Oral 78 16 95 % $Re'5\' 4"'bTd$  (1.626 m) 173 lb (78.472 kg)    7:25 PM Reevaluation with update and discussion. After initial assessment and treatment, an updated evaluation reveals patient remains alert and  conversant. Her daughter is concerned that she is confused. The patient continues to be responsive. At times she talks to herself, and appears to be seeing things in front of her. The patient does not specify what she is seeing or hearing. CT scan brain, and ammonia level ordered to evaluate for confusion.Daleen Bo L    Labs Review Labs Reviewed  BASIC METABOLIC PANEL - Abnormal; Notable for the following:    Sodium 132 (*)    Chloride 94 (*)    Glucose, Bld 250 (*)    BUN 21 (*)    Creatinine, Ser 1.64 (*)    GFR calc non Af Amer 26 (*)    GFR calc Af Amer 30 (*)    All other components within normal limits  BRAIN NATRIURETIC PEPTIDE - Abnormal; Notable for the following:    B Natriuretic Peptide 261.6 (*)    All other components within normal limits  URINALYSIS W MICROSCOPIC - Abnormal; Notable for the following:    Glucose, UA 100 (*)    Squamous Epithelial / LPF FEW (*)    Casts HYALINE CASTS (*)    All other components within normal limits  CBC  AMMONIA  I-STAT TROPOININ, ED    Imaging Review Ct Head Wo Contrast  07/21/2014   CLINICAL DATA:  Patient with increased confusion.  No headache.  EXAM: CT HEAD WITHOUT CONTRAST  TECHNIQUE: Contiguous axial images were obtained from the base of the skull through the vertex without intravenous contrast.  COMPARISON:  Brain CT 03/14/2011  FINDINGS: Ventricles and sulci are prominent compatible with atrophy. Periventricular and subcortical white matter hypodensity compatible with chronic small vessel ischemic changes. Bilateral basal ganglia calcifications. Old left basal ganglia lacunar infarct. No evidence for acute cortically based infarct, intracranial hemorrhage, mass lesion or mass-effect. Paranasal sinuses are well aerated. Mastoid air cells are unremarkable. Calvarium is intact.  IMPRESSION: No acute intracranial process.  Chronic small vessel ischemic changes and cerebral atrophy.   Electronically Signed   By: Lovey Newcomer M.D.    On: 07/21/2014 20:01     EKG Interpretation   Date/Time:  Tuesday July 21 2014 15:47:03 EDT Ventricular Rate:  78 PR Interval:  184 QRS Duration: 162 QT Interval:  514 QTC Calculation: 585 R Axis:   -76 Text Interpretation:  Atrial-sensed ventricular-paced rhythm Abnormal ECG  since last tracing no significant change Confirmed by Eulis Foster  MD, Vira Agar  ([id number]) on 07/21/2014 5:00:50 PM      MDM   Final diagnoses:  Dehydration  Confusion    Nonspecific dizziness, no apparent UTI, metabolic instability or serious bacterial infection. Confusion is nonspecific, intermittent and likely to senile dementia.  Nursing Notes Reviewed/ Care Coordinated Applicable Imaging Reviewed Interpretation of Laboratory Data incorporated into ED treatment  The patient appears reasonably screened and/or stabilized for discharge and I doubt any other medical condition or other [facility] requiring further screening, evaluation, or treatment in the ED at this time prior to discharge.  Plan: Home Medications- usuale Treatments- restturn here if the recommended treatment, does not improve the symptoms; Recommended follow up- PCP 1 week     Daleen Bo, MD  07/21/14 2354 

## 2014-07-21 NOTE — Progress Notes (Signed)
Addendum to Physical Therapy note from Jun 23, 2014   2014-06-23 1520  PT G-Codes **NOT FOR INPATIENT CLASS**  Functional Assessment Tool Used clinical judgement  Functional Limitation Mobility: Walking and moving around  Mobility: Walking and Moving Around Current Status [phone]) CK  Mobility: Walking and Moving Around Goal Status [phone]) [place], [place]

## 2014-07-21 NOTE — ED Notes (Signed)
Pt sent from doctors office for eval of intermittent abd pain and possible UTI, pt family states that pt has been more confused than normal. Pt also reports chest pain. Denies any n/v/d or fevers at this time. Nad noted.

## 2014-07-21 NOTE — Telephone Encounter (Signed)
Called again, got VM. LMOM.

## 2014-07-21 NOTE — ED Notes (Signed)
Dr. Eulis Foster at bedside to provide update to patient and family.

## 2014-07-21 NOTE — Telephone Encounter (Signed)
Daughter called to report onset of acute confusion, motion sickness, "room spinning" which came on about the same time. She acknowledges known history of UTI/bladder issues, same symptoms present 2-3 months ago w/ last UTI.  BP 128/75 w/ HR of 67 No orthostatic vitals obtained.  CBG 174 - daughter states she is supposed to report to PCP for CBG over 200.   Daughter called requesting urine culture - advised to contact PCP for recommended tests/assessment/treatment - she voiced understanding.

## 2014-07-21 NOTE — Telephone Encounter (Signed)
Left message for [name] to call back.

## 2014-07-21 NOTE — Discharge Instructions (Signed)
Try to drink 6 to 8 glasses of water each day. Eat 3 meals each day.  Dehydration, Adult Dehydration is when you lose more fluids from the body than you take in. Vital organs like the kidneys, brain, and heart cannot function without a proper amount of fluids and salt. Any loss of fluids from the body can cause dehydration.  CAUSES   Vomiting.  Diarrhea.  Excessive sweating.  Excessive urine output.  Fever. SYMPTOMS  Mild dehydration  Thirst.  Dry lips.  Slightly dry mouth. Moderate dehydration  Very dry mouth.  Sunken eyes.  Skin does not bounce back quickly when lightly pinched and released.  Dark urine and decreased urine production.  Decreased tear production.  Headache. Severe dehydration  Very dry mouth.  Extreme thirst.  Rapid, weak pulse (more than 100 beats per minute at rest).  Cold hands and feet.  Not able to sweat in spite of heat and temperature.  Rapid breathing.  Blue lips.  Confusion and lethargy.  Difficulty being awakened.  Minimal urine production.  No tears. DIAGNOSIS  Your caregiver will diagnose dehydration based on your symptoms and your exam. Blood and urine tests will help confirm the diagnosis. The diagnostic evaluation should also identify the cause of dehydration. TREATMENT  Treatment of mild or moderate dehydration can often be done at home by increasing the amount of fluids that you drink. It is best to drink small amounts of fluid more often. Drinking too much at one time can make vomiting worse. Refer to the home care instructions below. Severe dehydration needs to be treated at the hospital where you will probably be given intravenous (IV) fluids that contain water and electrolytes. HOME CARE INSTRUCTIONS   Ask your caregiver about specific rehydration instructions.  Drink enough fluids to keep your urine clear or pale yellow.  Drink small amounts frequently if you have nausea and vomiting.  Eat as you normally  do.  Avoid:  Foods or drinks high in sugar.  Carbonated drinks.  Juice.  Extremely hot or cold fluids.  Drinks with caffeine.  Fatty, greasy foods.  Alcohol.  Tobacco.  Overeating.  Gelatin desserts.  Wash your hands well to avoid spreading bacteria and viruses.  Only take over-the-counter or prescription medicines for pain, discomfort, or fever as directed by your caregiver.  Ask your caregiver if you should continue all prescribed and over-the-counter medicines.  Keep all follow-up appointments with your caregiver. SEEK MEDICAL CARE IF:  You have abdominal pain and it increases or stays in one area (localizes).  You have a rash, stiff neck, or severe headache.  You are irritable, sleepy, or difficult to awaken.  You are weak, dizzy, or extremely thirsty. SEEK IMMEDIATE MEDICAL CARE IF:   You are unable to keep fluids down or you get worse despite treatment.  You have frequent episodes of vomiting or diarrhea.  You have blood or green matter (bile) in your vomit.  You have blood in your stool or your stool looks black and tarry.  You have not urinated in 6 to 8 hours, or you have only urinated a small amount of very dark urine.  You have a fever.  You faint. MAKE SURE YOU:   Understand these instructions.  Will watch your condition.  Will get help right away if you are not doing well or get worse. Document Released: 01/02/2005 Document Revised: 03/27/2011 Document Reviewed: 08/22/2010 [facility] Patient Information 2015 [place], [place]. This information is not intended to replace advice given to you  by your health care provider. Make sure you discuss any questions you have with your health care provider.  Confusion Confusion is the inability to think with your usual speed or clarity. Confusion may come on quickly or slowly over time. How quickly the confusion comes on depends on the cause. Confusion can be due to any number of causes. CAUSES    Concussion, head injury, or head trauma.  Seizures.  Stroke.  Fever.  Brain tumor.  Age related decreased brain function (dementia).  Heightened emotional states like rage or terror.  Mental illness in which the person loses the ability to determine what is real and what is not (hallucinations).  Infections such as a urinary tract infection (UTI).  Toxic effects from alcohol, drugs, or prescription medicines.  Dehydration and an imbalance of salts in the body (electrolytes).  Lack of sleep.  Low blood sugar (diabetes).  Low levels of oxygen from conditions such as chronic lung disorders.  Drug interactions or other medicine side effects.  Nutritional deficiencies, especially niacin, thiamine, vitamin C, or vitamin B.  Sudden drop in body temperature (hypothermia).  Change in routine, such as when traveling or hospitalized. SIGNS AND SYMPTOMS  People often describe their thinking as cloudy or unclear when they are confused. Confusion can also include feeling disoriented. That means you are unaware of where or who you are. You may also not know what the date or time is. If confused, you may also have difficulty paying attention, remembering, and making decisions. Some people also act aggressively when they are confused.  DIAGNOSIS  The medical evaluation of confusion may include:  Blood and urine tests.  X-rays.  Brain and nervous system tests.  Analyzing your brain waves (electroencephalogram or EEG).  Magnetic resonance imaging (MRI) of your head.  Computed tomography (CT) scan of your head.  Mental status tests in which your health care provider may ask many questions. Some of these questions may seem silly or strange, but they are a very important test to help diagnose and treat confusion. TREATMENT  An admission to the hospital may not be needed, but a person with confusion should not be left alone. Stay with a family member or friend until the confusion  clears. Avoid alcohol, pain relievers, or sedative drugs until you have fully recovered. Do not drive until directed by your health care provider. HOME CARE INSTRUCTIONS  What family and friends can do:  To find out if someone is confused, ask the person to state his or her name, age, and the date. If the person is unsure or answers incorrectly, he or she is confused.  Always introduce yourself, no matter how well the person knows you.  Often remind the person of his or her location.  Place a calendar and clock near the confused person.  Help the person with his or her medicines. You may want to use a pill box, an alarm as a reminder, or give the person each dose as prescribed.  Talk about current events and plans for the day.  Try to keep the environment calm, quiet, and peaceful.  Make sure the person keeps follow-up visits with his or her health care provider. PREVENTION  Ways to prevent confusion:  Avoid alcohol.  Eat a balanced diet.  Get enough sleep.  Take medicine only as directed by your health care provider.  Do not become isolated. Spend time with other people and make plans for your days.  Keep careful watch on your blood sugar levels  if you are diabetic. SEEK IMMEDIATE MEDICAL CARE IF:   You develop severe headaches, repeated vomiting, seizures, blackouts, or slurred speech.  There is increasing confusion, weakness, numbness, restlessness, or personality changes.  You develop a loss of balance, have marked dizziness, feel uncoordinated, or fall.  You have delusions, hallucinations, or develop severe anxiety.  Your family members think you need to be rechecked. Document Released: 02/10/2004 Document Revised: 05/19/2013 Document Reviewed: 02/07/2013 [facility] Patient Information 2015 [place], [place]. This information is not intended to replace advice given to you by your health care provider. Make sure you discuss any questions you have with your health care  provider.

## 2014-07-21 NOTE — Telephone Encounter (Signed)
Received records from [facility] for appointment on 10/05/14 with Dr Sallyanne Kuster.  Records given to [facility] (medical records) for Dr Croitoru's schedule on 10/05/14. lp

## 2014-07-21 NOTE — Telephone Encounter (Signed)
Returning your call. °

## 2014-07-21 NOTE — Telephone Encounter (Signed)
Returning your call again

## 2014-07-22 ENCOUNTER — Telehealth: Payer: Self-pay | Admitting: *Deleted

## 2014-07-22 NOTE — Telephone Encounter (Signed)
Called & spoke w/ pt's daughter [name]) and confirmed 07/30/14 appt w/ her.  Mailed before appt letter, calendar, welcoming packet & intake form to pt.  Emailed Engineer, civil (consulting) at [employer] to make her aware.  [facility] and requested radiology reports.  Placed copy of records in Dr. Geralyn Flash box and took one to HIM to scan.

## 2014-07-30 ENCOUNTER — Encounter: Payer: Self-pay | Admitting: Hematology and Oncology

## 2014-07-30 ENCOUNTER — Ambulatory Visit: Payer: Medicare Other

## 2014-07-30 ENCOUNTER — Telehealth: Payer: Self-pay | Admitting: Hematology and Oncology

## 2014-07-30 ENCOUNTER — Ambulatory Visit (HOSPITAL_BASED_OUTPATIENT_CLINIC_OR_DEPARTMENT_OTHER): Payer: Medicare Other | Admitting: Hematology and Oncology

## 2014-07-30 VITALS — BP 118/59 | HR 66 | Temp 98.0°F | Resp 18 | Ht 64.0 in | Wt 181.8 lb

## 2014-07-30 DIAGNOSIS — I509 Heart failure, unspecified: Secondary | ICD-10-CM

## 2014-07-30 DIAGNOSIS — C50112 Malignant neoplasm of central portion of left female breast: Secondary | ICD-10-CM | POA: Insufficient documentation

## 2014-07-30 DIAGNOSIS — D0512 Intraductal carcinoma in situ of left breast: Secondary | ICD-10-CM | POA: Diagnosis not present

## 2014-07-30 DIAGNOSIS — C50912 Malignant neoplasm of unspecified site of left female breast: Secondary | ICD-10-CM

## 2014-07-30 DIAGNOSIS — Z803 Family history of malignant neoplasm of breast: Secondary | ICD-10-CM

## 2014-07-30 MED ORDER — ANASTROZOLE 1 MG PO TABS: 1.0000 mg | ORAL_TABLET | Freq: Every day | ORAL | Status: DC

## 2014-07-30 NOTE — Assessment & Plan Note (Signed)
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 Pathology and radiology review: I discussed with the patient and her family the difference between DCIS and invasive breast cancer. I also discussed the significance of ER and PR receptors and the implications of treatment.  Recommendation:Patient is not a candidate for surgery because of her CHF/pacemaker 1. I recommend antiestrogen therapy for palliation. 2. Anastrozole 1 mg by mouth daily was has been prescribed. We chose anastrozole versus tamoxifen because of her blood clot history. I discussed the risks and benefits of anastrozole which include hot flashes and myalgias and the risk of osteoporosis. There is a slight risk of blood clots with anastrozole as well however since she is currently on anticoagulation the risks are felt to be minimal.  The goals of treatment were discussed with the patient in great detail. Without surgery DCIS cannot be cured. The purpose of antiestrogen therapy is to keep DCIS under control and prevent invasive breast cancer. Because of her advanced age, I discussed with her that we can continue this as long as she wishes to continue. If she felt that she does not want to take any more anastrozole or if she becomes more frail, then we can discontinue it.  I answered all of their questions and provided written information regarding her diagnosis and treatment.

## 2014-07-30 NOTE — Telephone Encounter (Signed)
Gave avs & calendar for January °

## 2014-07-30 NOTE — Progress Notes (Signed)
[facility] CONSULT NOTE  Patient Care Team: [name], MD as PCP - General (Internal Medicine)  CHIEF COMPLAINTS/PURPOSE OF CONSULTATION:  Newly diagnosed left breast DCIS  HISTORY OF PRESENTING ILLNESS:  [name] 81 y.o. female is here because of recent diagnosis of left breast DCIS. She presented with discomfort in the left breast and undergone a mammogram that revealed an 8.6 cm area of calcifications. This was biopsied and showed DCIS that was ER 100% PR 40%. She has a long-standing history of congestive heart failure and has a pacemaker. And she is not a candidate for surgery. She had seen Dr. Donne Hazel who did not recommend surgery and referred to [place] for palliative treatment. She is in a wheelchair and is blind and cannot hear too well. But her mind is extremely sharp and she fully understands our discussion regarding her diagnosis and treatment.  I reviewed her records extensively and collaborated the history with the patient.  SUMMARY OF ONCOLOGIC HISTORY:   Breast cancer, left breast   06/30/2014 Initial Diagnosis Left breast biopsy: DCIS ER 100%, PR 40%, 8.6 cm area of grouped segmental calcifications   07/30/2014 -  Anti-estrogen oral therapy anastrozole 1 mg daily palliative treatment    In terms of breast cancer risk profile:  She menarched at early age of 22 and went to menopause at age 74  She had 9 pregnancy, her first child was born at age 42  She has not received birth control pills.  She was never exposed to fertility medications or hormone replacement therapy.  She has  family history of Breast/GYN/GI cancer She has a daughter who passed away from breast cancer MEDICAL HISTORY:  Past Medical History  Diagnosis Date  . Hypertension   . Coronary artery disease     a. 1994 s/p cabg;  b. 09/2013 MV: large, sev intensity, partially reversible inf, apical defect, prior inf/ap infarct w/ mild peri-infarct ischemia->Med Rx.  . Legally blind   .  CKD (chronic kidney disease), stage III     a. iii - iv.  . Anemia   . Hypothyroid   . Gastric ulcer   . Hiatal hernia   . Dyspepsia   . UTI (lower urinary tract infection)   . H/O: GI bleed 12//13  . High cholesterol   . Chronic combined systolic and diastolic CHF (congestive heart failure)     a. 01/2014 Echo: EF @ least mod-sev reduced with HK of lat/apical, basal inf walls. basalpost AK, Gr 1DD.  Marland Kitchen DVT of upper extremity (deep vein thrombosis) 06/13/2012    BUE  . Seasonal allergies   . Allergy to perfume   . Type II diabetes mellitus   . History of blood transfusion 2013  . GERD (gastroesophageal reflux disease)   . Daily headache   . Arthritis   . PAF (paroxysmal atrial fibrillation)     a. CHA2DS2VASc = 7-->coumadin.  . 2Nd degree atrioventricular block     a. s/p MDT ADDR01 DC PPM (ser #: [id number]).    SURGICAL HISTORY: Past Surgical History  Procedure Laterality Date  . Esophagogastroduodenoscopy  12/22/2011    Procedure: ESOPHAGOGASTRODUODENOSCOPY (EGD);  Surgeon: Gatha Mayer, MD;  Location: [facility];  Service: Endoscopy;  Laterality: N/A;  . Cardioversion  06/06/06    successful  . Insert / replace / remove pacemaker  06/29/2006    Medtronic adapta  . Cardiac catheterization  10/25/92  . Cardiac catheterization  11/18/03    w/grafts 100%CX LAD  80 & 100%  . Cardiac catheterization  01/24/05    diffuse disease of native vessels  . Cardiac catheterization  06/06/06    severe native CAD  . Coronary artery bypass graft  10/27/92    LIMA to LAD,SVG to LAD second diagonal,obtuse maraginal of the CX and posterior descendingbranch of the RCA  . Cataract extraction w/ intraocular lens  implant, bilateral Bilateral   . Refractive surgery Bilateral     SOCIAL HISTORY: History   Social History  . Marital Status: Married    Spouse Name: N/A  . Number of Children: 10  . Years of Education: N/A   Occupational History  .     Social History Main Topics  . Smoking  status: Never Smoker   . Smokeless tobacco: Never Used  . Alcohol Use: No  . Drug Use: No  . Sexual Activity: No   Other Topics Concern  . Not on file   Social History Narrative    FAMILY HISTORY: Family History  Problem Relation Age of Onset  . Breast cancer Daughter   . Diabetes Son   . Diabetes Daughter   . Heart disease Father   . Diabetes Father   . Colon polyps Daughter   . Heart attack Brother   . Diabetes Brother   . Stroke Sister     ALLERGIES:  is allergic to darvon; digoxin and related; penicillins; percocet; percodan; and vicodin.  MEDICATIONS:  Current Outpatient Prescriptions  Medication Sig Dispense Refill  . aspirin 81 MG chewable tablet Chew 81 mg by mouth daily.    Marland Kitchen atropine 1 % ophthalmic solution Place 1 drop into the right eye 4 (four) times daily.   0  . donepezil (ARICEPT) 5 MG tablet Take 5 mg by mouth at bedtime.   0  . feeding supplement, GLUCERNA SHAKE, (GLUCERNA SHAKE) LIQD Take 237 mLs by mouth daily.     . folic acid (FOLVITE) 1 MG tablet Take 1 tablet (1 mg total) by mouth daily. 90 tablet 3  . furosemide (LASIX) 40 MG tablet Take 1 tablet (40 mg total) by mouth 2 (two) times daily.    Marland Kitchen gabapentin (NEURONTIN) 100 MG capsule Take 100 mg by mouth 2 (two) times daily.     Marland Kitchen glipiZIDE (GLUCOTROL) 5 MG tablet Take one-half tablet by  mouth daily only if blood  sugar is over 200 (Patient taking differently: Take 5 mg by mouth at bedtime. ) 45 tablet 1  . levothyroxine (SYNTHROID, LEVOTHROID) 50 MCG tablet Take 1 tablet (50 mcg total) by mouth daily before breakfast. 30 tablet 1  . loratadine (CLARITIN) 10 MG tablet Take 10 mg by mouth daily.    . meclizine (ANTIVERT) 25 MG tablet Take 25 mg by mouth 3 (three) times daily as needed for dizziness.    . metoprolol tartrate (LOPRESSOR) 25 MG tablet Take 1 tablet (25 mg total) by mouth 2 (two) times daily. (Patient taking differently: Take 25 mg by mouth as needed. ) 90 tablet 6  . nitroGLYCERIN  (NITRODUR - DOSED IN MG/24 HR) 0.2 mg/hr patch Place 1 patch (0.2 mg total) onto the skin daily. 30 patch 6  . omeprazole (PRILOSEC) 20 MG capsule Take 1 capsule by mouth  daily 90 capsule 2  . Polyvinyl Alcohol-Povidone (REFRESH OP) Place 1 drop into both eyes daily.     . potassium chloride (K-DUR,KLOR-CON) 10 MEQ tablet Take 1 tablet (10 mEq total) by mouth 2 (two) times daily. 60 tablet 6  .  ranolazine ([name]) 500 MG 12 hr tablet Take 1 tablet (500 mg total) by mouth 2 (two) times daily. 60 tablet 11  . rosuvastatin (CRESTOR) 10 MG tablet Take 1 tablet (10 mg total) by mouth 3 (three) times a week. RDE:[id number] Exp: 8-18 (Patient taking differently: Take 10 mg by mouth every Monday, Wednesday, and Friday. ) 21 tablet 0  . tobramycin (TOBREX) 0.3 % ophthalmic solution Place 1 drop into the right eye daily as needed (eye iritation).     . traMADol (ULTRAM) 50 MG tablet Take 1 tablet (50 mg total) by mouth every 6 (six) hours as needed for moderate pain. 30 tablet 0  . Vitamin D, Ergocalciferol, (DRISDOL) 50000 UNITS CAPS capsule Take 1,000 Units by mouth daily.     Marland Kitchen warfarin (COUMADIN) 2.5 MG tablet Take 1 to 1.5 tablets by mouth daily as directed by coumadin clinic (Patient taking differently: Take 2.5-3.75 mg by mouth See admin instructions. Take 1 tablet on Sunday and Wednesday. Take 1.5 tablets all other days.) 40 tablet 2  . zolpidem (AMBIEN) 10 MG tablet Take 5 mg by mouth at bedtime. sleep    . anastrozole (ARIMIDEX) 1 MG tablet Take 1 tablet (1 mg total) by mouth daily. 90 tablet 3  . Insulin Glargine (LANTUS SOLOSTAR) 100 UNIT/ML Solostar Pen Inject 6 Units into the skin every morning. (Patient not taking: Reported on 07/21/2014) 5 pen 1   No current facility-administered medications for this visit.    REVIEW OF SYSTEMS:   Constitutional: Denies fevers, chills or abnormal night sweats Eyes: Denies blurriness of vision, double vision or watery eyes Ears, nose, mouth, throat, and face:  Denies mucositis or sore throat, hearing loss Respiratory: Denies cough, dyspnea or wheezes Cardiovascular: Denies palpitation, chest discomfort or lower extremity swelling Gastrointestinal:  Denies nausea, heartburn or change in bowel habits Skin:bruising from Coumadin Lymphatics: Denies new lymphadenopathy or easy bruising Neurological:blindness and hearing loss Behavioral/Psych: Mood is stable, no new changes  Breast: pain and tenderness in both breasts especially on the left All other systems were reviewed with the patient and are negative.  PHYSICAL EXAMINATION: ECOG PERFORMANCE STATUS: 3 - Symptomatic, >50% confined to bed  Filed Vitals:   07/30/14 1556  BP: 118/59  Pulse: 66  Temp: 98 F (36.7 C)  Resp: 18   Filed Weights   07/30/14 1556  Weight: 181 lb 12.8 oz (82.464 kg)    GENERAL:alert, no distress and comfortable SKIN: skin color, texture, turgor are normal, no rashes or significant lesions EYES: normal, conjunctiva are pink and non-injected, sclera clear OROPHARYNX:no exudate, no erythema and lips, buccal mucosa, and tongue normal  NECK: supple, thyroid normal size, non-tender, without nodularity LYMPH:  no palpable lymphadenopathy in the cervical, axillary or inguinal LUNGS: clear to auscultation and percussion with normal breathing effort HEART: regular rate & rhythm and no murmurs and no lower extremity edema ABDOMEN:abdomen soft, non-tender and normal bowel sounds Musculoskeletal:no cyanosis of digits and no clubbing  PSYCH: alert & oriented x 3 with fluent speech NEURO: no focal motor/sensory deficits BREAST small palpable nodule upper outer quadrant of the left breast, bruising related to the recent biopsy. No palpable axillary or supraclavicular lymphadenopathy (exam performed in the presence of a chaperone)   LABORATORY DATA:  I have reviewed the data as listed Lab Results  Component Value Date   WBC 6.5 07/21/2014   HGB 12.7 07/21/2014   HCT 38.9  07/21/2014   MCV 87.2 07/21/2014   PLT 179 07/21/2014   Lab  Results  Component Value Date   NA 132* 07/21/2014   K 4.2 07/21/2014   CL 94* 07/21/2014   CO2 27 07/21/2014    RADIOGRAPHIC STUDIES: I have personally reviewed the radiological reports and agreed with the findings in the report.  ASSESSMENT AND PLAN:  Breast cancer, left breast DCIS left breast ER 100% PR 40%, 8.6 area of calcifications status post biopsy on 06/30/2014. Pathology and radiology review: I discussed with the patient and her family the difference between DCIS and invasive breast cancer. I also discussed the significance of ER and PR receptors and the implications of treatment.  Recommendation:Patient is not a candidate for surgery because of her CHF/pacemaker 1. I recommend antiestrogen therapy for palliation. 2. Anastrozole 1 mg by mouth daily was has been prescribed. We chose anastrozole versus tamoxifen because of her blood clot history. I discussed the risks and benefits of anastrozole which include hot flashes and myalgias and the risk of osteoporosis. There is a slight risk of blood clots with anastrozole as well however since she is currently on anticoagulation the risks are felt to be minimal.  The goals of treatment were discussed with the patient in great detail. Without surgery DCIS cannot be cured. The purpose of antiestrogen therapy is to keep DCIS under control and prevent invasive breast cancer. Because of her advanced age, I discussed with her that we can continue this as long as she wishes to continue. If she felt that she does not want to take any more anastrozole or if she becomes more frail, then we can discontinue it.  I answered all of their questions and provided written information regarding her diagnosis and treatment.    All questions were answered. The patient knows to call the clinic with any problems, questions or concerns.    Rulon Eisenmenger, MD 4:47 PM   She is to

## 2014-07-30 NOTE — Progress Notes (Signed)
Ltr from pt daughter data 07/29/14 rcvd.  Reviewed by Dr. Lindi Adie.  Sent to scan.

## 2014-07-30 NOTE — Progress Notes (Signed)
Checked in new pt with no financial concerns prior to seeing the dr.  Pt has my card for any billing questions, concerns or if financial assistance is needed.  ° °

## 2014-08-07 ENCOUNTER — Telehealth: Payer: Self-pay | Admitting: Cardiovascular Disease

## 2014-08-07 NOTE — Telephone Encounter (Signed)
Pt systolic BP in 81L for last couple days.  HR 60-80 as daughter reports. No symptoms w/ exception of "sweating"; pt denies fatigue, dizziness, faintness.  Instructed on adequate fluid intake - ensure patient drinking enough water, if needed, sports drinks in limited quantity d/t concern of fluid retention Advised key consideration would be to make sure pt not becoming dehydrated, but BP alone not necessarily concerning w/o symptoms/if symptoms resolve.  Pt takes 40mg  lasix BID.  Advised if pt requires consumption of more fluid than usual, can give extra 1/2 tab of lasix next 2-3 days to mitigate volume excess. Call if worse or unimproved.  Daughter voiced understanding. Will route to [employer] for OK/additional advice.

## 2014-08-07 NOTE — Telephone Encounter (Signed)
Agree 

## 2014-08-07 NOTE — Telephone Encounter (Signed)
Pt's daughter is calling in stating that her systolic number has been consistantly in the 90's. She is concerned. Please call  Thanks

## 2014-08-13 ENCOUNTER — Encounter: Payer: Self-pay | Admitting: Gastroenterology

## 2014-08-13 ENCOUNTER — Ambulatory Visit (INDEPENDENT_AMBULATORY_CARE_PROVIDER_SITE_OTHER): Payer: Medicare Other

## 2014-08-13 ENCOUNTER — Telehealth: Payer: Self-pay | Admitting: Gastroenterology

## 2014-08-13 ENCOUNTER — Encounter: Payer: Self-pay | Admitting: Internal Medicine

## 2014-08-13 DIAGNOSIS — I480 Paroxysmal atrial fibrillation: Secondary | ICD-10-CM

## 2014-08-13 DIAGNOSIS — I442 Atrioventricular block, complete: Secondary | ICD-10-CM | POA: Diagnosis not present

## 2014-08-13 DIAGNOSIS — Z950 Presence of cardiac pacemaker: Secondary | ICD-10-CM

## 2014-08-13 NOTE — Telephone Encounter (Signed)
She is encouraged to contact the PCP.

## 2014-08-13 NOTE — Telephone Encounter (Signed)
[name] called back wanting to know if patient can drink pedialyte?

## 2014-08-13 NOTE — Telephone Encounter (Signed)
error 

## 2014-08-13 NOTE — Telephone Encounter (Signed)
Encouraged to communicate this with the PCP. She is not in pain. Her appetite is at her baseline. Not being subjected to the heat. No changes in her bowel habits.

## 2014-08-14 ENCOUNTER — Telehealth: Payer: Self-pay | Admitting: Pharmacist Clinician (PhC)/ Clinical Pharmacy Specialist

## 2014-08-14 ENCOUNTER — Telehealth: Payer: Self-pay | Admitting: *Deleted

## 2014-08-14 MED ORDER — WARFARIN SODIUM 2.5 MG PO TABS: ORAL_TABLET | ORAL | Status: DC

## 2014-08-14 NOTE — Telephone Encounter (Signed)
The information sent does not have any relevance to her. It has no bearing on her care. If there are specific questions, have the person who gave the info, come with her to the office visit.

## 2014-08-14 NOTE — Telephone Encounter (Signed)
Patient is completely out of Coumadin---please call refill ASAP   [facility] on [address]

## 2014-08-14 NOTE — Telephone Encounter (Signed)
HAS DR.GUDENA REVIEWED THE INFORMATION LEFT FOR HIM LAST WEEK? WOULD LIKE TO KNOW HIS THOUGHTS.

## 2014-08-16 ENCOUNTER — Encounter (HOSPITAL_COMMUNITY): Payer: Self-pay | Admitting: *Deleted

## 2014-08-16 ENCOUNTER — Emergency Department (HOSPITAL_COMMUNITY): Payer: Medicare Other

## 2014-08-16 ENCOUNTER — Inpatient Hospital Stay (HOSPITAL_COMMUNITY)
Admission: EM | Admit: 2014-08-16 | Discharge: 2014-08-22 | DRG: 871 | Disposition: A | Discharge status: Skilled Nursing Facility | Payer: Medicare Other | Attending: Internal Medicine | Admitting: Internal Medicine

## 2014-08-16 DIAGNOSIS — Z170 Estrogen receptor positive status [ER+]: Secondary | ICD-10-CM

## 2014-08-16 DIAGNOSIS — C50919 Malignant neoplasm of unspecified site of unspecified female breast: Secondary | ICD-10-CM | POA: Diagnosis present

## 2014-08-16 DIAGNOSIS — R740 Nonspecific elevation of levels of transaminase and lactic acid dehydrogenase [LDH]: Secondary | ICD-10-CM | POA: Diagnosis not present

## 2014-08-16 DIAGNOSIS — Z86718 Personal history of other venous thrombosis and embolism: Secondary | ICD-10-CM | POA: Diagnosis not present

## 2014-08-16 DIAGNOSIS — K759 Inflammatory liver disease, unspecified: Secondary | ICD-10-CM | POA: Diagnosis not present

## 2014-08-16 DIAGNOSIS — B9620 Unspecified Escherichia coli [E. coli] as the cause of diseases classified elsewhere: Secondary | ICD-10-CM | POA: Diagnosis present

## 2014-08-16 DIAGNOSIS — IMO0001 Reserved for inherently not codable concepts without codable children: Secondary | ICD-10-CM | POA: Diagnosis present

## 2014-08-16 DIAGNOSIS — E039 Hypothyroidism, unspecified: Secondary | ICD-10-CM | POA: Diagnosis present

## 2014-08-16 DIAGNOSIS — Z6831 Body mass index (BMI) 31.0-31.9, adult: Secondary | ICD-10-CM | POA: Diagnosis not present

## 2014-08-16 DIAGNOSIS — I482 Chronic atrial fibrillation, unspecified: Secondary | ICD-10-CM | POA: Diagnosis present

## 2014-08-16 DIAGNOSIS — I5042 Chronic combined systolic (congestive) and diastolic (congestive) heart failure: Secondary | ICD-10-CM | POA: Diagnosis present

## 2014-08-16 DIAGNOSIS — K219 Gastro-esophageal reflux disease without esophagitis: Secondary | ICD-10-CM | POA: Diagnosis present

## 2014-08-16 DIAGNOSIS — Z794 Long term (current) use of insulin: Secondary | ICD-10-CM

## 2014-08-16 DIAGNOSIS — I429 Cardiomyopathy, unspecified: Secondary | ICD-10-CM | POA: Diagnosis present

## 2014-08-16 DIAGNOSIS — Z951 Presence of aortocoronary bypass graft: Secondary | ICD-10-CM

## 2014-08-16 DIAGNOSIS — L8990 Pressure ulcer of unspecified site, unspecified stage: Secondary | ICD-10-CM | POA: Diagnosis present

## 2014-08-16 DIAGNOSIS — I248 Other forms of acute ischemic heart disease: Secondary | ICD-10-CM | POA: Diagnosis present

## 2014-08-16 DIAGNOSIS — K7200 Acute and subacute hepatic failure without coma: Secondary | ICD-10-CM | POA: Diagnosis present

## 2014-08-16 DIAGNOSIS — R509 Fever, unspecified: Secondary | ICD-10-CM | POA: Diagnosis present

## 2014-08-16 DIAGNOSIS — E1165 Type 2 diabetes mellitus with hyperglycemia: Secondary | ICD-10-CM | POA: Diagnosis present

## 2014-08-16 DIAGNOSIS — H548 Legal blindness, as defined in USA: Secondary | ICD-10-CM | POA: Diagnosis present

## 2014-08-16 DIAGNOSIS — N12 Tubulo-interstitial nephritis, not specified as acute or chronic: Secondary | ICD-10-CM | POA: Diagnosis present

## 2014-08-16 DIAGNOSIS — A419 Sepsis, unspecified organism: Secondary | ICD-10-CM

## 2014-08-16 DIAGNOSIS — Z7982 Long term (current) use of aspirin: Secondary | ICD-10-CM

## 2014-08-16 DIAGNOSIS — A4151 Sepsis due to Escherichia coli [E. coli]: Secondary | ICD-10-CM | POA: Diagnosis not present

## 2014-08-16 DIAGNOSIS — I509 Heart failure, unspecified: Secondary | ICD-10-CM | POA: Diagnosis not present

## 2014-08-16 DIAGNOSIS — N390 Urinary tract infection, site not specified: Secondary | ICD-10-CM

## 2014-08-16 DIAGNOSIS — E1122 Type 2 diabetes mellitus with diabetic chronic kidney disease: Secondary | ICD-10-CM | POA: Diagnosis present

## 2014-08-16 DIAGNOSIS — Z79899 Other long term (current) drug therapy: Secondary | ICD-10-CM

## 2014-08-16 DIAGNOSIS — E780 Pure hypercholesterolemia: Secondary | ICD-10-CM | POA: Diagnosis present

## 2014-08-16 DIAGNOSIS — E669 Obesity, unspecified: Secondary | ICD-10-CM | POA: Diagnosis present

## 2014-08-16 DIAGNOSIS — E038 Other specified hypothyroidism: Secondary | ICD-10-CM | POA: Diagnosis present

## 2014-08-16 DIAGNOSIS — G9340 Encephalopathy, unspecified: Secondary | ICD-10-CM | POA: Diagnosis present

## 2014-08-16 DIAGNOSIS — I2510 Atherosclerotic heart disease of native coronary artery without angina pectoris: Secondary | ICD-10-CM | POA: Diagnosis present

## 2014-08-16 DIAGNOSIS — N183 Chronic kidney disease, stage 3 (moderate): Secondary | ICD-10-CM | POA: Diagnosis present

## 2014-08-16 DIAGNOSIS — Z950 Presence of cardiac pacemaker: Secondary | ICD-10-CM | POA: Diagnosis not present

## 2014-08-16 DIAGNOSIS — I129 Hypertensive chronic kidney disease with stage 1 through stage 4 chronic kidney disease, or unspecified chronic kidney disease: Secondary | ICD-10-CM | POA: Diagnosis present

## 2014-08-16 DIAGNOSIS — N179 Acute kidney failure, unspecified: Secondary | ICD-10-CM | POA: Diagnosis present

## 2014-08-16 DIAGNOSIS — Z7901 Long term (current) use of anticoagulants: Secondary | ICD-10-CM

## 2014-08-16 DIAGNOSIS — Z79811 Long term (current) use of aromatase inhibitors: Secondary | ICD-10-CM

## 2014-08-16 DIAGNOSIS — A499 Bacterial infection, unspecified: Secondary | ICD-10-CM | POA: Diagnosis not present

## 2014-08-16 DIAGNOSIS — N10 Acute tubulo-interstitial nephritis: Secondary | ICD-10-CM | POA: Diagnosis not present

## 2014-08-16 DIAGNOSIS — R7401 Elevation of levels of liver transaminase levels: Secondary | ICD-10-CM | POA: Diagnosis present

## 2014-08-16 LAB — COMPREHENSIVE METABOLIC PANEL
ALBUMIN: 3.5 g/dL (ref 3.5–5.0)
ALT: 344 U/L — ABNORMAL HIGH (ref 14–54)
ANION GAP: 11 (ref 5–15)
AST: 594 U/L — ABNORMAL HIGH (ref 15–41)
Alkaline Phosphatase: 194 U/L — ABNORMAL HIGH (ref 38–126)
BUN: 26 mg/dL — ABNORMAL HIGH (ref 6–20)
CO2: 26 mmol/L (ref 22–32)
CREATININE: 1.96 mg/dL — AB (ref 0.44–1.00)
Calcium: 9.2 mg/dL (ref 8.9–10.3)
Chloride: 96 mmol/L — ABNORMAL LOW (ref 101–111)
GFR calc non Af Amer: 21 mL/min — ABNORMAL LOW (ref >60)
GFR, EST AFRICAN AMERICAN: 24 mL/min — AB (ref >60)
GLUCOSE: 286 mg/dL — AB (ref 65–99)
POTASSIUM: 4.8 mmol/L (ref 3.5–5.1)
SODIUM: 133 mmol/L — AB (ref 135–145)
TOTAL PROTEIN: 7.1 g/dL (ref 6.5–8.1)
Total Bilirubin: 1.7 mg/dL — ABNORMAL HIGH (ref 0.3–1.2)

## 2014-08-16 LAB — CBC WITH DIFFERENTIAL/PLATELET
Basophils Absolute: 0 10*3/uL (ref 0.0–0.1)
Basophils Relative: 0 % (ref 0–1)
EOS ABS: 0 10*3/uL (ref 0.0–0.7)
EOS PCT: 0 % (ref 0–5)
HEMATOCRIT: 41 % (ref 36.0–46.0)
Hemoglobin: 13.7 g/dL (ref 12.0–15.0)
LYMPHS PCT: 3 % — AB (ref 12–46)
Lymphs Abs: 0.4 10*3/uL — ABNORMAL LOW (ref 0.7–4.0)
MCH: 29.1 pg (ref 26.0–34.0)
MCHC: 33.4 g/dL (ref 30.0–36.0)
MCV: 87 fL (ref 78.0–100.0)
MONO ABS: 0.7 10*3/uL (ref 0.1–1.0)
MONOS PCT: 5 % (ref 3–12)
Neutro Abs: 12.7 10*3/uL — ABNORMAL HIGH (ref 1.7–7.7)
Neutrophils Relative %: 92 % — ABNORMAL HIGH (ref 43–77)
Platelets: 187 10*3/uL (ref 150–400)
RBC: 4.71 MIL/uL (ref 3.87–5.11)
RDW: 14 % (ref 11.5–15.5)
WBC: 13.8 10*3/uL — AB (ref 4.0–10.5)

## 2014-08-16 LAB — URINE MICROSCOPIC-ADD ON

## 2014-08-16 LAB — URINALYSIS, ROUTINE W REFLEX MICROSCOPIC
BILIRUBIN URINE: NEGATIVE
GLUCOSE, UA: NEGATIVE mg/dL
Ketones, ur: NEGATIVE mg/dL
Nitrite: NEGATIVE
Protein, ur: 30 mg/dL — AB
SPECIFIC GRAVITY, URINE: 1.012 (ref 1.005–1.030)
UROBILINOGEN UA: 1 mg/dL (ref 0.0–1.0)
pH: 6.5 (ref 5.0–8.0)

## 2014-08-16 LAB — I-STAT CG4 LACTIC ACID, ED: LACTIC ACID, VENOUS: 2.62 mmol/L — AB (ref 0.5–2.0)

## 2014-08-16 LAB — CG4 I-STAT (LACTIC ACID): Lactic Acid, Venous: 2.76 mmol/L (ref 0.5–2.0)

## 2014-08-16 LAB — PROTIME-INR
INR: 2.91 — AB (ref 0.00–1.49)
PROTHROMBIN TIME: 29.9 s — AB (ref 11.6–15.2)

## 2014-08-16 LAB — GLUCOSE, CAPILLARY: GLUCOSE-CAPILLARY: 205 mg/dL — AB (ref 65–99)

## 2014-08-16 MED ORDER — NITROGLYCERIN 0.2 MG/HR TD PT24
0.2000 mg | MEDICATED_PATCH | Freq: Every day | TRANSDERMAL | Status: DC
  Administered 2014-08-17 – 2014-08-22 (×6): 0.2 mg via TRANSDERMAL
  Filled 2014-08-16 (×6): qty 1

## 2014-08-16 MED ORDER — RANOLAZINE ER 500 MG PO TB12
500.0000 mg | ORAL_TABLET | Freq: Two times a day (BID) | ORAL | Status: DC
  Administered 2014-08-16 – 2014-08-22 (×11): 500 mg via ORAL
  Filled 2014-08-16 (×13): qty 1

## 2014-08-16 MED ORDER — DEXTROSE 5 % IV SOLN
1.0000 g | Freq: Three times a day (TID) | INTRAVENOUS | Status: DC
  Filled 2014-08-16 (×2): qty 1

## 2014-08-16 MED ORDER — INSULIN ASPART 100 UNIT/ML ~~LOC~~ SOLN
0.0000 [IU] | Freq: Three times a day (TID) | SUBCUTANEOUS | Status: DC
  Administered 2014-08-17 – 2014-08-18 (×3): 1 [IU] via SUBCUTANEOUS
  Administered 2014-08-18: 2 [IU] via SUBCUTANEOUS
  Administered 2014-08-19 – 2014-08-20 (×2): 1 [IU] via SUBCUTANEOUS

## 2014-08-16 MED ORDER — ROSUVASTATIN CALCIUM 10 MG PO TABS: 10.0000 mg | ORAL_TABLET | ORAL | Status: DC

## 2014-08-16 MED ORDER — LEVOFLOXACIN IN D5W 500 MG/100ML IV SOLN: 500.0000 mg | INTRAVENOUS | Status: DC

## 2014-08-16 MED ORDER — ONDANSETRON HCL 4 MG PO TABS: 4.0000 mg | ORAL_TABLET | Freq: Four times a day (QID) | ORAL | Status: DC | PRN

## 2014-08-16 MED ORDER — LEVOFLOXACIN IN D5W 750 MG/150ML IV SOLN
750.0000 mg | Freq: Once | INTRAVENOUS | Status: AC
  Administered 2014-08-16: 750 mg via INTRAVENOUS
  Filled 2014-08-16: qty 150

## 2014-08-16 MED ORDER — ZOLPIDEM TARTRATE 5 MG PO TABS
5.0000 mg | ORAL_TABLET | Freq: Every day | ORAL | Status: DC
  Administered 2014-08-16 – 2014-08-19 (×4): 5 mg via ORAL
  Filled 2014-08-16 (×4): qty 1

## 2014-08-16 MED ORDER — SODIUM CHLORIDE 0.9 % IV BOLUS (SEPSIS)
1000.0000 mL | Freq: Once | INTRAVENOUS | Status: AC
  Administered 2014-08-16: 1000 mL via INTRAVENOUS

## 2014-08-16 MED ORDER — SODIUM CHLORIDE 0.9 % IV SOLN
INTRAVENOUS | Status: DC
  Administered 2014-08-16: 20:00:00 via INTRAVENOUS

## 2014-08-16 MED ORDER — WARFARIN SODIUM 2.5 MG PO TABS
2.5000 mg | ORAL_TABLET | Freq: Once | ORAL | Status: AC
  Administered 2014-08-16: 2.5 mg via ORAL
  Filled 2014-08-16: qty 1

## 2014-08-16 MED ORDER — SODIUM CHLORIDE 0.9 % IV SOLN: INTRAVENOUS | Status: DC

## 2014-08-16 MED ORDER — TOBRAMYCIN 0.3 % OP SOLN
1.0000 [drp] | Freq: Every day | OPHTHALMIC | Status: DC | PRN
  Filled 2014-08-16: qty 5

## 2014-08-16 MED ORDER — MECLIZINE HCL 25 MG PO TABS
25.0000 mg | ORAL_TABLET | Freq: Three times a day (TID) | ORAL | Status: DC | PRN
  Filled 2014-08-16: qty 1

## 2014-08-16 MED ORDER — ATROPINE SULFATE 1 % OP SOLN
1.0000 [drp] | Freq: Four times a day (QID) | OPHTHALMIC | Status: DC
  Administered 2014-08-17 – 2014-08-19 (×4): 1 [drp] via OPHTHALMIC
  Filled 2014-08-16: qty 2

## 2014-08-16 MED ORDER — LEVOTHYROXINE SODIUM 50 MCG PO TABS
50.0000 ug | ORAL_TABLET | Freq: Every day | ORAL | Status: DC
  Administered 2014-08-17 – 2014-08-22 (×6): 50 ug via ORAL
  Filled 2014-08-16 (×9): qty 1

## 2014-08-16 MED ORDER — PANTOPRAZOLE SODIUM 40 MG PO TBEC
40.0000 mg | DELAYED_RELEASE_TABLET | Freq: Every day | ORAL | Status: DC
  Administered 2014-08-17 – 2014-08-22 (×6): 40 mg via ORAL
  Filled 2014-08-16 (×6): qty 1

## 2014-08-16 MED ORDER — DONEPEZIL HCL 5 MG PO TABS
5.0000 mg | ORAL_TABLET | Freq: Every day | ORAL | Status: DC
  Administered 2014-08-16 – 2014-08-21 (×6): 5 mg via ORAL
  Filled 2014-08-16 (×6): qty 1

## 2014-08-16 MED ORDER — GABAPENTIN 100 MG PO CAPS
100.0000 mg | ORAL_CAPSULE | Freq: Two times a day (BID) | ORAL | Status: DC
  Administered 2014-08-16 – 2014-08-22 (×12): 100 mg via ORAL
  Filled 2014-08-16 (×12): qty 1

## 2014-08-16 MED ORDER — WARFARIN - PHARMACIST DOSING INPATIENT
Freq: Every day | Status: DC
  Administered 2014-08-20 – 2014-08-21 (×2)

## 2014-08-16 MED ORDER — ASPIRIN 81 MG PO CHEW
81.0000 mg | CHEWABLE_TABLET | Freq: Every day | ORAL | Status: DC
  Administered 2014-08-17 – 2014-08-22 (×6): 81 mg via ORAL
  Filled 2014-08-16 (×6): qty 1

## 2014-08-16 MED ORDER — TRAMADOL HCL 50 MG PO TABS
50.0000 mg | ORAL_TABLET | Freq: Four times a day (QID) | ORAL | Status: DC | PRN
  Administered 2014-08-16 – 2014-08-22 (×7): 50 mg via ORAL
  Filled 2014-08-16 (×8): qty 1

## 2014-08-16 MED ORDER — DEXTROSE 5 % IV SOLN
2.0000 g | INTRAVENOUS | Status: AC
  Administered 2014-08-16: 2 g via INTRAVENOUS
  Filled 2014-08-16: qty 2

## 2014-08-16 MED ORDER — ONDANSETRON HCL 4 MG/2ML IJ SOLN: 4.0000 mg | Freq: Four times a day (QID) | INTRAMUSCULAR | Status: DC | PRN

## 2014-08-16 MED ORDER — POTASSIUM CHLORIDE CRYS ER 10 MEQ PO TBCR
10.0000 meq | EXTENDED_RELEASE_TABLET | Freq: Two times a day (BID) | ORAL | Status: DC
  Administered 2014-08-16 – 2014-08-22 (×12): 10 meq via ORAL
  Filled 2014-08-16 (×12): qty 1

## 2014-08-16 MED ORDER — ANASTROZOLE 1 MG PO TABS
1.0000 mg | ORAL_TABLET | Freq: Every day | ORAL | Status: DC
  Administered 2014-08-17 – 2014-08-18 (×2): 1 mg via ORAL
  Filled 2014-08-16 (×2): qty 1

## 2014-08-16 NOTE — ED Provider Notes (Signed)
CSN: [id number]     Arrival date & time 08/16/14  1350 History   First MD Initiated Contact with Patient 08/16/14 1435     Chief Complaint  Patient presents with  . Hypotension  . Urinary Frequency     (Consider location/radiation/quality/duration/timing/severity/associated sxs/prior Treatment) HPI   Patient is a 81 year old female with history of multiple UTIs presenting today with low blood pressure and urinary symptoms. Patient says she has not been eating or drinking as much as usual. Reports that she's been vomiting today. Reports that she has not been feeling herself. Family agree that she's been slower to move than usual and reports that her blood pressures been in the 90 systolic for the last week. Normally runs 662 systolic.   Past Medical History  Diagnosis Date  . Hypertension   . Coronary artery disease     a. 1994 s/p cabg;  b. 09/2013 MV: large, sev intensity, partially reversible inf, apical defect, prior inf/ap infarct w/ mild peri-infarct ischemia->Med Rx.  . Legally blind   . CKD (chronic kidney disease), stage III     a. iii - iv.  . Anemia   . Hypothyroid   . Gastric ulcer   . Hiatal hernia   . Dyspepsia   . UTI (lower urinary tract infection)   . H/O: GI bleed 12//13  . High cholesterol   . Chronic combined systolic and diastolic CHF (congestive heart failure)     a. 01/2014 Echo: EF @ least mod-sev reduced with HK of lat/apical, basal inf walls. basalpost AK, Gr 1DD.  Marland Kitchen DVT of upper extremity (deep vein thrombosis) 06/13/2012    BUE  . Seasonal allergies   . Allergy to perfume   . Type II diabetes mellitus   . History of blood transfusion 2013  . GERD (gastroesophageal reflux disease)   . Daily headache   . Arthritis   . PAF (paroxysmal atrial fibrillation)     a. CHA2DS2VASc = 7-->coumadin.  . 2Nd degree atrioventricular block     a. s/p MDT ADDR01 DC PPM (ser #: [id number]).   Past Surgical History  Procedure Laterality Date  .  Esophagogastroduodenoscopy  12/22/2011    Procedure: ESOPHAGOGASTRODUODENOSCOPY (EGD);  Surgeon: Gatha Mayer, MD;  Location: [facility];  Service: Endoscopy;  Laterality: N/A;  . Cardioversion  06/06/06    successful  . Insert / replace / remove pacemaker  06/29/2006    [employer]tronic adapta  . Cardiac catheterization  10/25/92  . Cardiac catheterization  11/18/03    w/grafts 100%CX LAD 80 & 100%  . Cardiac catheterization  01/24/05    diffuse disease of native vessels  . Cardiac catheterization  06/06/06    severe native CAD  . Coronary artery bypass graft  10/27/92    LIMA to LAD,SVG to LAD second diagonal,obtuse maraginal of the CX and posterior descendingbranch of the RCA  . Cataract extraction w/ intraocular lens  implant, bilateral Bilateral   . Refractive surgery Bilateral    Family History  Problem Relation Age of Onset  . Breast cancer Daughter   . Diabetes Son   . Diabetes Daughter   . Heart disease Father   . Diabetes Father   . Colon polyps Daughter   . Heart attack Brother   . Diabetes Brother   . Stroke Sister    History  Substance Use Topics  . Smoking status: Never Smoker   . Smokeless tobacco: Never Used  . Alcohol Use: No   OB History  No data available     Review of Systems  Constitutional: Negative for activity change and fatigue.  HENT: Negative for congestion and drooling.   Eyes: Negative for discharge.  Respiratory: Negative for cough and chest tightness.   Cardiovascular: Negative for chest pain.  Gastrointestinal: Positive for vomiting. Negative for nausea, abdominal pain and abdominal distention.  Genitourinary: Negative for dysuria and difficulty urinating.  Musculoskeletal: Negative for joint swelling.  Skin: Negative for rash.  Allergic/Immunologic: Negative for immunocompromised state.  Neurological: Negative for seizures and speech difficulty.  Psychiatric/Behavioral: Negative for behavioral problems and agitation.      Allergies   Darvon; Digoxin and related; Penicillins; Percocet; Percodan; and Vicodin  Home Medications   Prior to Admission medications   Medication Sig Start Date End Date Taking? Authorizing Provider  anastrozole (ARIMIDEX) 1 MG tablet Take 1 tablet (1 mg total) by mouth daily. 07/30/14  Yes Nicholas Lose, MD  aspirin 81 MG chewable tablet Chew 81 mg by mouth daily.   Yes Historical Provider, MD  atropine 1 % ophthalmic solution Place 1 drop into the right eye 4 (four) times daily.  10/29/13  Yes Historical Provider, MD  donepezil (ARICEPT) 5 MG tablet Take 5 mg by mouth at bedtime.  07/13/14  Yes Historical Provider, MD  feeding supplement, GLUCERNA SHAKE, (GLUCERNA SHAKE) LIQD Take 237 mLs by mouth daily.    Yes Historical Provider, MD  folic acid (FOLVITE) 1 MG tablet Take 1 tablet (1 mg total) by mouth daily. 01/19/14  Yes Mihai Croitoru, MD  furosemide (LASIX) 40 MG tablet Take 1 tablet (40 mg total) by mouth 2 (two) times daily. 06/17/14  Yes Liliane Shi, PA-C  gabapentin (NEURONTIN) 100 MG capsule Take 100 mg by mouth 2 (two) times daily.    Yes Historical Provider, MD  glipiZIDE (GLUCOTROL) 5 MG tablet Take one-half tablet by  mouth daily only if blood  sugar is over 200 Patient taking differently: Take 5 mg by mouth at bedtime.  05/18/14  Yes Elayne Snare, MD  levothyroxine (SYNTHROID, LEVOTHROID) 50 MCG tablet Take 1 tablet (50 mcg total) by mouth daily before breakfast. 06/19/14  Yes Elayne Snare, MD  loratadine (CLARITIN) 10 MG tablet Take 10 mg by mouth daily.   Yes Historical Provider, MD  meclizine (ANTIVERT) 25 MG tablet Take 25 mg by mouth 3 (three) times daily as needed for dizziness.   Yes Historical Provider, MD  metoprolol tartrate (LOPRESSOR) 25 MG tablet Take 1 tablet (25 mg total) by mouth 2 (two) times daily. Patient taking differently: Take 25 mg by mouth as needed.  02/07/14  Yes Reyne Dumas, MD  nitroGLYCERIN (NITRODUR - DOSED IN MG/24 HR) 0.2 mg/hr patch Place 1 patch (0.2 mg total)  onto the skin daily. 02/12/14  Yes Luke K Kilroy, PA-C  omeprazole (PRILOSEC) 20 MG capsule Take 1 capsule by mouth  daily 07/14/14  Yes Mihai Croitoru, MD  potassium chloride (K-DUR,KLOR-CON) 10 MEQ tablet Take 1 tablet (10 mEq total) by mouth 2 (two) times daily. 07/10/14  Yes Mihai Croitoru, MD  ranolazine ([name]) 500 MG 12 hr tablet Take 1 tablet (500 mg total) by mouth 2 (two) times daily. 07/10/14  Yes Scott T Kathlen Mody, PA-C  traMADol (ULTRAM) 50 MG tablet Take 1 tablet (50 mg total) by mouth every 6 (six) hours as needed for moderate pain. 05/14/14  Yes Mihai Croitoru, MD  Vitamin D, Ergocalciferol, (DRISDOL) 50000 UNITS CAPS capsule Take 1,000 Units by mouth daily.    Yes Historical  Provider, MD  warfarin (COUMADIN) 2.5 MG tablet Take 1 to 1.5 tablets by mouth daily as directed by coumadin clinic Patient taking differently: Take 2.5 mg by mouth. 2.5 mg on Wednesday and Sunday.  3.25 mg all other days. 08/14/14  Yes Mihai Croitoru, MD  zolpidem (AMBIEN) 10 MG tablet Take 5 mg by mouth at bedtime. sleep   Yes Historical Provider, MD  Insulin Glargine (LANTUS SOLOSTAR) 100 UNIT/ML Solostar Pen Inject 6 Units into the skin every morning. Patient not taking: Reported on 07/21/2014 05/29/14   Velvet Bathe, MD  Polyvinyl Alcohol-Povidone (REFRESH OP) Place 1 drop into both eyes daily as needed (dry eyes).     Historical Provider, MD  rosuvastatin (CRESTOR) 10 MG tablet Take 1 tablet (10 mg total) by mouth 3 (three) times a week. [id number] Exp: 8-18 Patient taking differently: Take 10 mg by mouth every Monday, Wednesday, and Friday.  02/18/14   Erlene Quan, PA-C  tobramycin (TOBREX) 0.3 % ophthalmic solution Place 1 drop into the right eye daily as needed (eye iritation).  10/18/12   Historical Provider, MD   BP 111/61 mmHg  Pulse 92  Temp(Src) 97.9 F (36.6 C) (Oral)  Resp 19  SpO2 99% Physical Exam  Constitutional: She is oriented to person, place, and time. She appears well-developed and  well-nourished.  HENT:  Head: Normocephalic and atraumatic.  Eyes: Conjunctivae are normal. Right eye exhibits no discharge.  Neck: Neck supple.  Cardiovascular: Normal rate, regular rhythm and normal heart sounds.   No murmur heard. Pulmonary/Chest: Effort normal and breath sounds normal. She has no wheezes. She has no rales.  Abdominal: Soft. She exhibits no distension. There is no tenderness.  Obese nontender  Musculoskeletal: Normal range of motion. She exhibits no edema.  Neurological: She is oriented to person, place, and time. No cranial nerve deficit.  Skin: Skin is warm and dry. No rash noted. She is not diaphoretic.  Psychiatric: She has a normal mood and affect. Her behavior is normal.  Nursing note and vitals reviewed.   ED Course  Procedures (including critical care time) Labs Review Labs Reviewed  COMPREHENSIVE METABOLIC PANEL - Abnormal; Notable for the following:    Sodium 133 (*)    Chloride 96 (*)    Glucose, Bld 286 (*)    BUN 26 (*)    Creatinine, Ser 1.96 (*)    AST 594 (*)    ALT 344 (*)    Alkaline Phosphatase 194 (*)    Total Bilirubin 1.7 (*)    GFR calc non Af Amer 21 (*)    GFR calc Af Amer 24 (*)    All other components within normal limits  CBC WITH DIFFERENTIAL/PLATELET - Abnormal; Notable for the following:    WBC 13.8 (*)    Neutrophils Relative % 92 (*)    Neutro Abs 12.7 (*)    Lymphocytes Relative 3 (*)    Lymphs Abs 0.4 (*)    All other components within normal limits  I-STAT CG4 LACTIC ACID, ED - Abnormal; Notable for the following:    Lactic Acid, Venous 2.62 (*)    All other components within normal limits  CULTURE, BLOOD (ROUTINE X 2)  CULTURE, BLOOD (ROUTINE X 2)  URINE CULTURE  URINALYSIS, ROUTINE W REFLEX MICROSCOPIC (NOT AT [facility])  I-STAT CG4 LACTIC ACID, ED    Imaging Review Dg Chest Port 1 View  08/16/2014   CLINICAL DATA:  Sepsis, hypertension, diabetes.  EXAM: PORTABLE CHEST - 1 VIEW  COMPARISON:  05/24/2014   FINDINGS: Sternotomy wires and left-sided pacemaker unchanged. Lungs are hypoinflated without focal consolidation or effusion. There subtle prominence of the perihilar markings which may be due to mild degree of vascular congestion. There is borderline stable cardiomegaly. Remainder of the exam is unchanged.  IMPRESSION: Hypoinflation with borderline stable cardiomegaly and possible mild vascular congestion.   Electronically Signed   By: Marin Olp M.D.   On: 08/16/2014 15:27     EKG Interpretation None     ED ECG REPORT   Date: 08/16/2014  Rate:89   Rhythm: indeterminate Paced rhythm. V paced.   I have personally reviewed the EKG tracing and agree with the computerized printout as noted.    MDM   Final diagnoses:  Sepsis    Patient is a 81 year old female presenting with hypotension and urinary complaints. Suspect urosepsis. Will initiate sepsis labs including lactate 2 L of fluid and cultures. We'll start with Rocephin, urine culture pending. Blood pressure is low, however responded to fluids. Patient mentating clearly. According to daughter patient's blood pressure did run in the 90 systolic for over one week. We'll likely admit to stepdown.  CRITICAL CARE Performed by: Gardiner Sleeper   Total critical care time: 30  Critical care time was exclusive of separately billable procedures and treating other patients.  Critical care was necessary to treat or prevent imminent or life-threatening deterioration.  Critical care was time spent personally by me on the following activities: development of treatment plan with patient and/or surrogate as well as nursing, discussions with consultants, evaluation of patient's response to treatment, examination of patient, obtaining history from patient or surrogate, ordering and performing treatments and interventions, ordering and review of laboratory studies, ordering and review of radiographic studies, pulse oximetry and re-evaluation  of patient's condition.    Heily Carlucci Julio Alm, MD 08/16/14 1554

## 2014-08-16 NOTE — ED Notes (Signed)
Report attempted nurse to call back.  

## 2014-08-16 NOTE — Progress Notes (Signed)
ANTIBIOTIC CONSULT NOTE - INITIAL  Pharmacy Consult for levaquin + aztreonam Indication: urosepsis  Allergies  Allergen Reactions  . Darvon     Unknown reaction   . Digoxin And Related Nausea Only  . Penicillins Other (See Comments)    Was told had allergy from childhood... Unknown reaction  . Percocet [Oxycodone-Acetaminophen] Other (See Comments)    confused  . Percodan [Oxycodone-Aspirin]   . Vicodin [Hydrocodone-Acetaminophen] Other (See Comments)    confused    Patient Measurements:   Adjusted Body Weight:   Vital Signs: Temp: 97.9 F (36.6 C) (07/31 1356) Temp Source: Oral (07/31 1356) BP: 89/50 mmHg (07/31 1500) Pulse Rate: 88 (07/31 1500) Intake/Output from previous day:   Intake/Output from this shift:    Labs:  Recent Labs  08/16/14 1419  WBC 13.8*  HGB 13.7  PLT 187  CREATININE 1.96*   CrCl cannot be calculated (Unknown ideal weight.). No results for input(s): VANCOTROUGH, VANCOPEAK, VANCORANDOM, GENTTROUGH, GENTPEAK, GENTRANDOM, TOBRATROUGH, TOBRAPEAK, TOBRARND, AMIKACINPEAK, AMIKACINTROU, AMIKACIN in the last 72 hours.   Microbiology: No results found for this or any previous visit (from the past 720 hour(s)).  Medical History: Past Medical History  Diagnosis Date  . Hypertension   . Coronary artery disease     a. 1994 s/p cabg;  b. 09/2013 MV: large, sev intensity, partially reversible inf, apical defect, prior inf/ap infarct w/ mild peri-infarct ischemia->Med Rx.  . Legally blind   . CKD (chronic kidney disease), stage III     a. iii - iv.  . Anemia   . Hypothyroid   . Gastric ulcer   . Hiatal hernia   . Dyspepsia   . UTI (lower urinary tract infection)   . H/O: GI bleed 12//13  . High cholesterol   . Chronic combined systolic and diastolic CHF (congestive heart failure)     a. 01/2014 Echo: EF @ least mod-sev reduced with HK of lat/apical, basal inf walls. basalpost AK, Gr 1DD.  Marland Kitchen DVT of upper extremity (deep vein thrombosis)  06/13/2012    BUE  . Seasonal allergies   . Allergy to perfume   . Type II diabetes mellitus   . History of blood transfusion 2013  . GERD (gastroesophageal reflux disease)   . Daily headache   . Arthritis   . PAF (paroxysmal atrial fibrillation)     a. CHA2DS2VASc = 7-->coumadin.  . 2Nd degree atrioventricular block     a. s/p MDT ADDR01 DC PPM (ser #: [id number]).    Medications:  Anti-infectives    Start     Dose/Rate Route Frequency Ordered Stop   08/18/14 1500  levofloxacin (LEVAQUIN) IVPB 500 mg     500 mg 100 mL/hr over 60 Minutes Intravenous Every 48 hours 08/16/14 1530     08/17/14 0000  aztreonam (AZACTAM) 1 g in dextrose 5 % 50 mL IVPB     1 g 100 mL/hr over 30 Minutes Intravenous Every 8 hours 08/16/14 1530     08/16/14 1515  levofloxacin (LEVAQUIN) IVPB 750 mg     750 mg 100 mL/hr over 90 Minutes Intravenous  Once 08/16/14 1501     08/16/14 1515  aztreonam (AZACTAM) 2 g in dextrose 5 % 50 mL IVPB     2 g 100 mL/hr over 30 Minutes Intravenous STAT 08/16/14 1501 08/17/14 1515     Assessment: 12 yof presented to the hospital with fever, emesis and frequent urination. To start empiric levaquin and aztreonam for possible urosepsis. Pt is afebrile and  WBC is elevated at 13.8. Scr is also elevated at 1.96 and lactic acid is elevated at 2.62.   Levaquin 7/31>> Aztreo 7/31>>  Goal of Therapy:  Eradication of infection  Plan:  - Levaquin 750mg  IV x 1 then 500mg  IV Q48H - Aztreonam 2gm IV x 1 then 1gm IV Q8H - F/u renal fxn, C&S, clinical status   Colleena Kurtenbach, Rande Lawman 08/16/2014,3:30 PM

## 2014-08-16 NOTE — Progress Notes (Addendum)
UR COMPLETED  

## 2014-08-16 NOTE — Progress Notes (Signed)
ANTICOAGULATION CONSULT NOTE - Initial Consult  Pharmacy Consult for Coumadin Indication: atrial fibrillation  Allergies  Allergen Reactions  . Darvon     Unknown reaction   . Digoxin And Related Nausea Only  . Penicillins Other (See Comments)    Was told had allergy from childhood... Unknown reaction  . Percocet [Oxycodone-Acetaminophen] Other (See Comments)    confused  . Percodan [Oxycodone-Aspirin]   . Vicodin [Hydrocodone-Acetaminophen] Other (See Comments)    confused    Patient Measurements: Height: 5\' 4"  (162.6 cm) Weight: 181 lb 10.5 oz (82.4 kg) IBW/kg (Calculated) : 54.7 Heparin Dosing Weight: n/a  Vital Signs: Temp: 98.4 F (36.9 C) (07/31 1800) Temp Source: Oral (07/31 1800) BP: 120/69 mmHg (07/31 1715) Pulse Rate: 92 (07/31 1800)  Labs:  Recent Labs  08/16/14 1419 08/16/14 1636  HGB 13.7  --   HCT 41.0  --   PLT 187  --   LABPROT  --  29.9*  INR  --  2.91*  CREATININE 1.96*  --     Estimated Creatinine Clearance: 19 mL/min (by C-G formula based on Cr of 1.96).   Medical History: Past Medical History  Diagnosis Date  . Hypertension   . Coronary artery disease     a. 1994 s/p cabg;  b. 09/2013 MV: large, sev intensity, partially reversible inf, apical defect, prior inf/ap infarct w/ mild peri-infarct ischemia->Med Rx.  . Legally blind   . CKD (chronic kidney disease), stage III     a. iii - iv.  . Anemia   . Hypothyroid   . Gastric ulcer   . Hiatal hernia   . Dyspepsia   . UTI (lower urinary tract infection)   . H/O: GI bleed 12//13  . High cholesterol   . Chronic combined systolic and diastolic CHF (congestive heart failure)     a. 01/2014 Echo: EF @ least mod-sev reduced with HK of lat/apical, basal inf walls. basalpost AK, Gr 1DD.  Marland Kitchen DVT of upper extremity (deep vein thrombosis) 06/13/2012    BUE  . Seasonal allergies   . Allergy to perfume   . Type II diabetes mellitus   . History of blood transfusion 2013  . GERD  (gastroesophageal reflux disease)   . Daily headache   . Arthritis   . PAF (paroxysmal atrial fibrillation)     a. CHA2DS2VASc = 7-->coumadin.  . 2Nd degree atrioventricular block     a. s/p MDT ADDR01 DC PPM (ser #: [id number]).    Assessment: 81 yo female on chronic Coumadin for hx afib.  PTA dose 3.25 mg daily except 2.5 mg on Wed, Sun.  Today's INR therapeutic.  Last dose at home yesterday 7/30.  Goal of Therapy:  INR 2-3 Monitor platelets by anticoagulation protocol: Yes   Plan:  1. Coumadin 2.5 mg x 1 tonight (consistent with home dose) 2. Daily PT/INR.  Uvaldo Rising, BCPS  Clinical Pharmacist Pager [phone]  08/16/2014 7:26 PM

## 2014-08-16 NOTE — H&P (Addendum)
[facility] History and Physical  LESTER PLATAS FAO:[id number] DOB: Oct 19, 1920 DOA: 08/16/2014  Referring physician: ED PA, Gerald Leitz  PCP: Marlou Sa [name], MD   Chief Complaint: fevers, low BP at home   HPI:  Pt is 81 yo female with recurrent UTI, presented to [facility] ED by family for evaluation of several days duration of fevers and BP as low 90/60 at home with intermittent confusion and lethargy. Pt is unable to provide nay history at the time of the admission due to lethargy but family at bedside says she has had some dysuria and malodorous urine typical for her UTI's. No reported chest pain or shortness of breath, no reported abd concerns, no reported focal neurological symptoms.   In ED, pt looks calm and sleeping, easy to arouse but doses off very quickly, VSS, UA concerning for UTI and with SBP in 80's, TRH asked to admit to step down for evaluation and management of presumptive sepsis.  Assessment and Plan: Active Problems:   Sepsis - pt meets criteria for sepsis with HR up to 97, RR up to 23, WBC 13 K, elevated lactic acid and low SBP, source likely urinary - agree with broader spectrum for now until urine culture is back - agree with admission to SDU - provide IVF, repeat BMP and CBC in AM - follow up on blood and urine cultures    Acute on chronic renal failure, stage III - pre renal etiology from sepsis imposed on CKD stage III with baseline Cr ~ 1.5 - IVF will be provided and will repeat BMP in AM   Transaminitis - no specific focal findings on abd exam, pt denies pain at this time  - back in January 2016 abd [place] was notable for distended gallbladder in upper limits of normal but no evidence of gallstones - abd [place] requested and will repeat CMET In AM - stop statin that pt takes at home    Acute encephalopathy - secondary to sepsis due to UTI    Chronic diastolic CHF - with last 2 D ECHO in 01/2014 with grade I diastolic CHF - no over signs of overload but CXR  worrisome for mild vascular congestion - close monitoring of volume status especially now that pt is on IVF which will continue for only 8 hours - reassess in AM - weight on admission 82.4 kg - daily weights and strict I/O   A-fib, rate controlled, CHADS 2 vasc score 7 - continue coumadin per pharmacy    Hypothyroidism - continue synthroid   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   Radiological Exams on Admission: Dg Chest Port 1 View  08/16/2014   CLINICAL DATA:  Sepsis, hypertension, diabetes.  EXAM: PORTABLE CHEST - 1 VIEW  COMPARISON:  05/24/2014  FINDINGS: Sternotomy wires and left-sided pacemaker unchanged. Lungs are hypoinflated without focal consolidation or effusion. There subtle prominence of the perihilar markings which may be due to mild degree of vascular congestion. There is borderline stable cardiomegaly. Remainder of the exam is unchanged.  IMPRESSION: Hypoinflation with borderline stable cardiomegaly and possible mild vascular congestion.   Electronically Signed   By: Marin Olp M.D.   On: 08/16/2014 15:27     Code Status: Full Family Communication: Family at bedside Disposition Plan: Admit for further evaluation    [facility]  [phone]   Review of Systems:  Difficult to obtain as pt is somnolent     Past Medical History  Diagnosis Date  . Hypertension   . Coronary artery disease     a. 1994 s/p cabg;  b. 09/2013 MV: large, sev intensity, partially reversible inf, apical defect, prior inf/ap infarct w/ mild peri-infarct ischemia->Med Rx.  . Legally blind   . CKD (chronic kidney disease), stage III     a. iii - iv.  . Anemia   . Hypothyroid   . Gastric ulcer   . Hiatal hernia   . Dyspepsia   . UTI (lower urinary tract infection)   . H/O: GI bleed 12//13  . High cholesterol   . Chronic combined systolic and diastolic CHF  (congestive heart failure)     a. 01/2014 Echo: EF @ least mod-sev reduced with HK of lat/apical, basal inf walls. basalpost AK, Gr 1DD.  Marland Kitchen DVT of upper extremity (deep vein thrombosis) 06/13/2012    BUE  . Seasonal allergies   . Allergy to perfume   . Type II diabetes mellitus   . History of blood transfusion 2013  . GERD (gastroesophageal reflux disease)   . Daily headache   . Arthritis   . PAF (paroxysmal atrial fibrillation)     a. CHA2DS2VASc = 7-->coumadin.  . 2Nd degree atrioventricular block     a. s/p MDT ADDR01 DC PPM (ser #: [id number]).    Past Surgical History  Procedure Laterality Date  . Esophagogastroduodenoscopy  12/22/2011    Procedure: ESOPHAGOGASTRODUODENOSCOPY (EGD);  Surgeon: Gatha Mayer, MD;  Location: [facility];  Service: Endoscopy;  Laterality: N/A;  . Cardioversion  06/06/06    successful  . Insert / replace / remove pacemaker  06/29/2006    Medtronic adapta  . Cardiac catheterization  10/25/92  . Cardiac catheterization  11/18/03    w/grafts 100%CX LAD 80 & 100%  . Cardiac catheterization  01/24/05    diffuse disease of native vessels  . Cardiac catheterization  06/06/06    severe native CAD  . Coronary artery bypass graft  10/27/92    LIMA to LAD,SVG to LAD second diagonal,obtuse maraginal of the CX and posterior descendingbranch of the RCA  . Cataract extraction w/ intraocular lens  implant, bilateral Bilateral   . Refractive surgery Bilateral     Social History:  reports that she has never smoked. She has never used smokeless tobacco. She reports that she does not drink alcohol or use illicit drugs.  Allergies  Allergen Reactions  . Darvon     Unknown reaction   . Digoxin And Related Nausea Only  . Penicillins Other (See Comments)    Was told had allergy from childhood... Unknown reaction  . Percocet [Oxycodone-Acetaminophen] Other (See Comments)    confused  . Percodan [Oxycodone-Aspirin]   . Vicodin [Hydrocodone-Acetaminophen] Other (See  Comments)    confused    Family History  Problem Relation Age of Onset  . Breast cancer Daughter   . Diabetes Son   . Diabetes Daughter   . Heart disease Father   . Diabetes Father   . Colon polyps Daughter   . Heart attack Brother   . Diabetes Brother   . Stroke Sister     Prior to Admission medications   Medication Sig Start Date End Date Taking? Authorizing Provider  anastrozole (ARIMIDEX) 1 MG tablet Take 1 tablet (1 mg total) by mouth daily. 07/30/14  Yes Nicholas Lose, MD  aspirin 81 MG  chewable tablet Chew 81 mg by mouth daily.   Yes Historical Provider, MD  atropine 1 % ophthalmic solution Place 1 drop into the right eye 4 (four) times daily.  10/29/13  Yes Historical Provider, MD  donepezil (ARICEPT) 5 MG tablet Take 5 mg by mouth at bedtime.  07/13/14  Yes Historical Provider, MD  feeding supplement, GLUCERNA SHAKE, (GLUCERNA SHAKE) LIQD Take 237 mLs by mouth daily.    Yes Historical Provider, MD  folic acid (FOLVITE) 1 MG tablet Take 1 tablet (1 mg total) by mouth daily. 01/19/14  Yes Mihai Croitoru, MD  furosemide (LASIX) 40 MG tablet Take 1 tablet (40 mg total) by mouth 2 (two) times daily. 06/17/14  Yes Liliane Shi, PA-C  gabapentin (NEURONTIN) 100 MG capsule Take 100 mg by mouth 2 (two) times daily.    Yes Historical Provider, MD  glipiZIDE (GLUCOTROL) 5 MG tablet Take one-half tablet by  mouth daily only if blood  sugar is over 200 Patient taking differently: Take 5 mg by mouth at bedtime.  05/18/14  Yes Elayne Snare, MD  levothyroxine (SYNTHROID, LEVOTHROID) 50 MCG tablet Take 1 tablet (50 mcg total) by mouth daily before breakfast. 06/19/14  Yes Elayne Snare, MD  loratadine (CLARITIN) 10 MG tablet Take 10 mg by mouth daily.   Yes Historical Provider, MD  meclizine (ANTIVERT) 25 MG tablet Take 25 mg by mouth 3 (three) times daily as needed for dizziness.   Yes Historical Provider, MD  metoprolol tartrate (LOPRESSOR) 25 MG tablet Take 1 tablet (25 mg total) by mouth 2 (two) times  daily. Patient taking differently: Take 25 mg by mouth as needed.  02/07/14  Yes Reyne Dumas, MD  nitroGLYCERIN (NITRODUR - DOSED IN MG/24 HR) 0.2 mg/hr patch Place 1 patch (0.2 mg total) onto the skin daily. 02/12/14  Yes Luke K Kilroy, PA-C  omeprazole (PRILOSEC) 20 MG capsule Take 1 capsule by mouth  daily 07/14/14  Yes Mihai Croitoru, MD  potassium chloride (K-DUR,KLOR-CON) 10 MEQ tablet Take 1 tablet (10 mEq total) by mouth 2 (two) times daily. 07/10/14  Yes Mihai Croitoru, MD  ranolazine ([name]) 500 MG 12 hr tablet Take 1 tablet (500 mg total) by mouth 2 (two) times daily. 07/10/14  Yes Scott T Kathlen Mody, PA-C  traMADol (ULTRAM) 50 MG tablet Take 1 tablet (50 mg total) by mouth every 6 (six) hours as needed for moderate pain. 05/14/14  Yes Mihai Croitoru, MD  Vitamin D, Ergocalciferol, (DRISDOL) 50000 UNITS CAPS capsule Take 1,000 Units by mouth daily.    Yes Historical Provider, MD  warfarin (COUMADIN) 2.5 MG tablet Take 1 to 1.5 tablets by mouth daily as directed by coumadin clinic Patient taking differently: Take 2.5 mg by mouth. 2.5 mg on Wednesday and Sunday.  3.25 mg all other days. 08/14/14  Yes Mihai Croitoru, MD  zolpidem (AMBIEN) 10 MG tablet Take 5 mg by mouth at bedtime. sleep   Yes Historical Provider, MD  Insulin Glargine (LANTUS SOLOSTAR) 100 UNIT/ML Solostar Pen Inject 6 Units into the skin every morning. Patient not taking: Reported on 07/21/2014 05/29/14   Velvet Bathe, MD  Polyvinyl Alcohol-Povidone (REFRESH OP) Place 1 drop into both eyes daily as needed (dry eyes).     Historical Provider, MD  rosuvastatin (CRESTOR) 10 MG tablet Take 1 tablet (10 mg total) by mouth 3 (three) times a week. FAO:[id number] Exp: 8-18 Patient taking differently: Take 10 mg by mouth every Monday, Wednesday, and Friday.  02/18/14   Erlene Quan, PA-C  tobramycin (TOBREX) 0.3 % ophthalmic solution Place 1 drop into the right eye daily as needed (eye iritation).  10/18/12   Historical Provider, MD    Physical  Exam: Filed Vitals:   08/16/14 1615 08/16/14 1645 08/16/14 1700 08/16/14 1715  BP: 102/57 104/59 115/66 120/69  Pulse: 92 89 89 93  Temp:      TempSrc:      Resp: 19 17 16 23   SpO2: 95% 97% 97% 98%    Physical Exam  Constitutional: Appears calm, NAD HENT: Normocephalic. External right and left ear normal. Dry MM Eyes: Conjunctivae and EOM are normal. PERRLA, no scleral icterus.  Neck: Normal ROM. Neck supple. No JVD. No tracheal deviation. No thyromegaly.  CVS: IRRR, S1/S2 +, no gallops, no carotid bruit.  Pulmonary: Effort and breath sounds normal, no stridor, diminished breath sounds at bases  Abdominal: Soft. BS +,  no distension, tenderness, rebound or guarding.  Musculoskeletal: Normal range of motion.  Lymphadenopathy: No lymphadenopathy noted, cervical, inguinal. Neuro: Somnolent but easy to arouse, doses off easily, moving all 4 extremities with no difficulties  Skin: Skin is warm and dry. No rash noted. Not diaphoretic. No erythema. No pallor.  Psychiatric: difficult to assess as pt sleepy   Labs on Admission:  Basic Metabolic Panel:  Recent Labs Lab 08/16/14 1419  NA 133*  K 4.8  CL 96*  CO2 26  GLUCOSE 286*  BUN 26*  CREATININE 1.96*  CALCIUM 9.2   Liver Function Tests:  Recent Labs Lab 08/16/14 1419  AST 594*  ALT 344*  ALKPHOS 194*  BILITOT 1.7*  PROT 7.1  ALBUMIN 3.5   CBC:  Recent Labs Lab 08/16/14 1419  WBC 13.8*  NEUTROABS 12.7*  HGB 13.7  HCT 41.0  MCV 87.0  PLT 187   EKG: pending   If 7PM-7AM, please contact night-coverage [email or web address] Password [facility] 08/16/2014, 6:11 PM

## 2014-08-16 NOTE — ED Notes (Signed)
Pt brought here by family for fever of 101 (given tylenol), emesis and frequent urination.  Hypotensive in triage.

## 2014-08-17 ENCOUNTER — Inpatient Hospital Stay (HOSPITAL_COMMUNITY): Payer: Medicare Other

## 2014-08-17 DIAGNOSIS — K759 Inflammatory liver disease, unspecified: Secondary | ICD-10-CM

## 2014-08-17 DIAGNOSIS — L8990 Pressure ulcer of unspecified site, unspecified stage: Secondary | ICD-10-CM | POA: Diagnosis present

## 2014-08-17 DIAGNOSIS — E669 Obesity, unspecified: Secondary | ICD-10-CM

## 2014-08-17 DIAGNOSIS — N10 Acute tubulo-interstitial nephritis: Secondary | ICD-10-CM

## 2014-08-17 LAB — COMPREHENSIVE METABOLIC PANEL
ALK PHOS: 149 U/L — AB (ref 38–126)
ALT: 213 U/L — AB (ref 14–54)
AST: 264 U/L — ABNORMAL HIGH (ref 15–41)
Albumin: 2.7 g/dL — ABNORMAL LOW (ref 3.5–5.0)
Anion gap: 7 (ref 5–15)
BILIRUBIN TOTAL: 0.9 mg/dL (ref 0.3–1.2)
BUN: 21 mg/dL — AB (ref 6–20)
CALCIUM: 8.6 mg/dL — AB (ref 8.9–10.3)
CO2: 26 mmol/L (ref 22–32)
CREATININE: 1.6 mg/dL — AB (ref 0.44–1.00)
Chloride: 101 mmol/L (ref 101–111)
GFR calc Af Amer: 31 mL/min — ABNORMAL LOW (ref >60)
GFR, EST NON AFRICAN AMERICAN: 27 mL/min — AB (ref >60)
GLUCOSE: 125 mg/dL — AB (ref 65–99)
Potassium: 4 mmol/L (ref 3.5–5.1)
SODIUM: 134 mmol/L — AB (ref 135–145)
Total Protein: 6 g/dL — ABNORMAL LOW (ref 6.5–8.1)

## 2014-08-17 LAB — GLUCOSE, CAPILLARY
GLUCOSE-CAPILLARY: 121 mg/dL — AB (ref 65–99)
GLUCOSE-CAPILLARY: 133 mg/dL — AB (ref 65–99)
Glucose-Capillary: 97 mg/dL (ref 65–99)
Glucose-Capillary: 111 mg/dL — ABNORMAL HIGH (ref 65–99)

## 2014-08-17 LAB — CBC
HEMATOCRIT: 35.4 % — AB (ref 36.0–46.0)
Hemoglobin: 11.8 g/dL — ABNORMAL LOW (ref 12.0–15.0)
MCH: 29.1 pg (ref 26.0–34.0)
MCHC: 33.3 g/dL (ref 30.0–36.0)
MCV: 87.4 fL (ref 78.0–100.0)
PLATELETS: 143 10*3/uL — AB (ref 150–400)
RBC: 4.05 MIL/uL (ref 3.87–5.11)
RDW: 14 % (ref 11.5–15.5)
WBC: 9.5 10*3/uL (ref 4.0–10.5)

## 2014-08-17 LAB — PROTIME-INR
INR: 3.44 — ABNORMAL HIGH (ref 0.00–1.49)
PROTHROMBIN TIME: 34 s — AB (ref 11.6–15.2)

## 2014-08-17 MED ORDER — SODIUM CHLORIDE 0.9 % IV SOLN
INTRAVENOUS | Status: DC
  Administered 2014-08-18 – 2014-08-20 (×3): via INTRAVENOUS

## 2014-08-17 NOTE — Progress Notes (Signed)
[place] TEAM 1 - Stepdown/ICU TEAM Progress Note  DEBRAH GRANDERSON JOI:[id number] DOB: 03/17/1920 DOA: 08/16/2014 PCP: Marlou Sa [name], MD  Admit HPI / Brief Narrative: 81 yo female with hx of recurrent UTIs who presented to [facility] ED for evaluation of several days duration of fevers and BP as low 90/60 with intermittent confusion and lethargy. Family stated she had some dysuria and malodorous urine typical for her UTI's.  In ED the pt was easy to arouse but dozed off very quickly, VSS, UA concerning for UTI and with SBP in 80's.  HPI/Subjective: The patient is alert and interactive though hard of hearing.  She states she feels much better today.  She denies chest pain nausea vomiting or abdominal pain.  Her appetite has returned and she is quite hungry.  Assessment/Plan:  Sepsis due to E coli pyelonephritis  Continue antibiotic therapy but narrow spectrum and follow-up sensitivities  Acute on chronic renal failure stage 2 Renal function is slowly improving with volume resuscitation - continue gentle hydration and follow-up in a.m.  Transaminitis  Most consistent with low-grade shock liver - imaging of liver parenchyma unrevealing - follow trend  Chronic systolic and diastolic CHF Previous TTEs has failed to quantify systolic function but have suggested significant impairment in EF as well as confirmed diastolic dysfunction - exercise caution with volume resuscitation  Chronic Afib CHADS 2 vasc score 7 - continue chronic anticoagulation with warfarin  Hypothyroidism  DM2 w/ renal complications CBG well controlled at this time - follow trend  Obesity - Body mass index is 31.2 kg/(m^2).  Code Status: FULL Family Communication: Spoke with son and multiple other family members at bedside Disposition Plan: SDU  Consultants: none  Procedures: none  Antibiotics: Aztreonam 7/31 > Levaquin 7/31   DVT prophylaxis: warfarin  Objective: Blood pressure 102/47, pulse 76,  temperature 98.6 F (37 C), temperature source Oral, resp. rate 13, height 5\' 4"  (1.626 m), weight 82.5 kg (181 lb 14.1 oz), SpO2 99 %.  Intake/Output Summary (Last 24 hours) at 08/17/14 1553 Last data filed at 08/17/14 0806  Gross per 24 hour  Intake   1735 ml  Output      1 ml  Net   1734 ml   Exam: General: No acute respiratory distress Lungs: Clear to auscultation bilaterally without wheezes or crackles Cardiovascular: Regular rate and rhythm without murmur gallop or rub normal S1 and S2 Abdomen: Nontender, overweight, soft, bowel sounds positive, no rebound, no ascites, no appreciable mass Extremities: No significant cyanosis, clubbing, or edema bilateral lower extremities  Data Reviewed: Basic Metabolic Panel:  Recent Labs Lab 08/16/14 1419 08/17/14 0238  NA 133* 134*  K 4.8 4.0  CL 96* 101  CO2 26 26  GLUCOSE 286* 125*  BUN 26* 21*  CREATININE 1.96* 1.60*  CALCIUM 9.2 8.6*    CBC:  Recent Labs Lab 08/16/14 1419 08/17/14 0238  WBC 13.8* 9.5  NEUTROABS 12.7*  --   HGB 13.7 11.8*  HCT 41.0 35.4*  MCV 87.0 87.4  PLT 187 143*    Liver Function Tests:  Recent Labs Lab 08/16/14 1419 08/17/14 0238  AST 594* 264*  ALT 344* 213*  ALKPHOS 194* 149*  BILITOT 1.7* 0.9  PROT 7.1 6.0*  ALBUMIN 3.5 2.7*    Coags:  Recent Labs Lab 08/16/14 1636 08/17/14 0238  INR 2.91* 3.44*   CBG:  Recent Labs Lab 08/16/14 2134 08/17/14 0807 08/17/14 1248  GLUCAP 205* 97 121*    Recent Results (from the past  240 hour(s))  Culture, blood (routine x 2)     Status: None (Preliminary result)   Collection Time: 08/16/14  2:19 PM  Result Value Ref Range Status   Specimen Description BLOOD LEFT ANTECUBITAL  Final   Special Requests BOTTLES DRAWN AEROBIC ONLY 2CC  Final   Culture NO GROWTH 1 DAY  Final   Report Status PENDING  Incomplete  Urine culture     Status: None (Preliminary result)   Collection Time: 08/16/14  3:21 PM  Result Value Ref Range Status    Specimen Description URINE, CATHETERIZED  Final   Special Requests NONE  Final   Culture >=100,000 COLONIES/mL ESCHERICHIA COLI  Final   Report Status PENDING  Incomplete  Culture, blood (routine x 2)     Status: None (Preliminary result)   Collection Time: 08/16/14  3:40 PM  Result Value Ref Range Status   Specimen Description BLOOD RIGHT HAND  Final   Special Requests BOTTLES DRAWN AEROBIC ONLY 3CC  Final   Culture NO GROWTH < 24 HOURS  Final   Report Status PENDING  Incomplete     Studies:   Recent x-ray studies have been reviewed in detail by the Attending Physician  Scheduled Meds:  Scheduled Meds: . anastrozole  1 mg Oral Daily  . aspirin  81 mg Oral Daily  . atropine  1 drop Right Eye QID  . donepezil  5 mg Oral QHS  . gabapentin  100 mg Oral BID  . insulin aspart  0-9 Units Subcutaneous TID WC  . [START ON 08/18/2014] levofloxacin (LEVAQUIN) IV  500 mg Intravenous Q48H  . levothyroxine  50 mcg Oral QAC breakfast  . nitroGLYCERIN  0.2 mg Transdermal Daily  . pantoprazole  40 mg Oral Daily  . potassium chloride  10 mEq Oral BID  . ranolazine  500 mg Oral BID  . Warfarin - Pharmacist Dosing Inpatient   Does not apply q1800  . zolpidem  5 mg Oral QHS    Time spent on care of this patient: 35 mins   MCCLUNG,JEFFREY T , MD   [facility] Office  [phone] Pager - Text Page per Shea Evans as per below:  On-Call/Text Page:      [email or web address]      password [email or web address]  If 7PM-7AM, please contact night-coverage [email or web address] Password [email or web address] 08/17/2014, 3:53 PM   LOS: 1 day

## 2014-08-17 NOTE — Telephone Encounter (Signed)
ON 08/17/14 AT 3:03PM NOTIFIED JOYCE PER DR.GUDENA-THE INFORMATION SENT DOES NOT HAVE ANY RELEVANCE TO HER. IT HAS NO BEARING ON HER CARE. IF THERE ARE SPECIFIC QUESTIONS, HAVE THE PERSON WHO GAVE THE INFORMATION, COME WITH HER TO THE OFFICE VISIT. JOYCE VOICES UNDERSTANDING.

## 2014-08-17 NOTE — Progress Notes (Addendum)
ANTICOAGULATION CONSULT NOTE - Follow Up Consult  Pharmacy Consult for Coumadin Indication: atrial fibrillation  Allergies  Allergen Reactions  . Darvon     Unknown reaction   . Digoxin And Related Nausea Only  . Penicillins Other (See Comments)    Was told had allergy from childhood... Unknown reaction  . Percocet [Oxycodone-Acetaminophen] Other (See Comments)    confused  . Percodan [Oxycodone-Aspirin]   . Vicodin [Hydrocodone-Acetaminophen] Other (See Comments)    confused    Patient Measurements: Height: 5\' 4"  (162.6 cm) Weight: 181 lb 14.1 oz (82.5 kg) IBW/kg (Calculated) : 54.7  Vital Signs: Temp: 99 F (37.2 C) (08/01 0806) Temp Source: Oral (08/01 0806) BP: 93/48 mmHg (08/01 0806) Pulse Rate: 71 (08/01 0806)  Labs:  Recent Labs  08/16/14 1419 08/16/14 1636 08/17/14 0238  HGB 13.7  --  11.8*  HCT 41.0  --  35.4*  PLT 187  --  143*  LABPROT  --  29.9* 34.0*  INR  --  2.91* 3.44*  CREATININE 1.96*  --  1.60*    Estimated Creatinine Clearance: 23.3 mL/min (by C-G formula based on Cr of 1.6).  Assessment: 81 yo female on chronic Coumadin for hx afib. warfarin per Rx - afib.  Home dose 3.25 mg daily except 2.5 mg on Wed and Sun with last dose taken on 7/30. INR on admit was therapeutic at 2.91, now supratherapeutic at 3.44. Hgb low at 11.8, plts 143. No s/s of bleed. Started on Levaquin yesterday for urosepsis, may need lower doses while on abx.  Goal of Therapy:  INR 2-3 Monitor platelets by anticoagulation protocol: Yes   Plan:  HOLD coumadin tonight Monitor daily INR, CBC, s/s of bleed  Dewey Viens J 08/17/2014,10:10 AM

## 2014-08-18 ENCOUNTER — Telehealth: Payer: Self-pay | Admitting: *Deleted

## 2014-08-18 ENCOUNTER — Telehealth: Payer: Self-pay

## 2014-08-18 DIAGNOSIS — I5042 Chronic combined systolic (congestive) and diastolic (congestive) heart failure: Secondary | ICD-10-CM | POA: Diagnosis present

## 2014-08-18 DIAGNOSIS — N189 Chronic kidney disease, unspecified: Secondary | ICD-10-CM

## 2014-08-18 DIAGNOSIS — R740 Nonspecific elevation of levels of transaminase and lactic acid dehydrogenase [LDH]: Secondary | ICD-10-CM

## 2014-08-18 DIAGNOSIS — E038 Other specified hypothyroidism: Secondary | ICD-10-CM | POA: Diagnosis present

## 2014-08-18 DIAGNOSIS — I482 Chronic atrial fibrillation, unspecified: Secondary | ICD-10-CM | POA: Diagnosis present

## 2014-08-18 DIAGNOSIS — R7401 Elevation of levels of liver transaminase levels: Secondary | ICD-10-CM | POA: Diagnosis present

## 2014-08-18 DIAGNOSIS — N179 Acute kidney failure, unspecified: Secondary | ICD-10-CM

## 2014-08-18 LAB — COMPREHENSIVE METABOLIC PANEL
ALK PHOS: 125 U/L (ref 38–126)
ALT: 139 U/L — AB (ref 14–54)
AST: 142 U/L — AB (ref 15–41)
Albumin: 2.5 g/dL — ABNORMAL LOW (ref 3.5–5.0)
Anion gap: 7 (ref 5–15)
BUN: 22 mg/dL — ABNORMAL HIGH (ref 6–20)
CALCIUM: 8.7 mg/dL — AB (ref 8.9–10.3)
CO2: 26 mmol/L (ref 22–32)
Chloride: 102 mmol/L (ref 101–111)
Creatinine, Ser: 1.71 mg/dL — ABNORMAL HIGH (ref 0.44–1.00)
GFR calc non Af Amer: 25 mL/min — ABNORMAL LOW (ref >60)
GFR, EST AFRICAN AMERICAN: 29 mL/min — AB (ref >60)
Glucose, Bld: 114 mg/dL — ABNORMAL HIGH (ref 65–99)
Potassium: 4.3 mmol/L (ref 3.5–5.1)
Sodium: 135 mmol/L (ref 135–145)
TOTAL PROTEIN: 5.5 g/dL — AB (ref 6.5–8.1)
Total Bilirubin: 0.8 mg/dL (ref 0.3–1.2)

## 2014-08-18 LAB — CBC
HCT: 34 % — ABNORMAL LOW (ref 36.0–46.0)
Hemoglobin: 11 g/dL — ABNORMAL LOW (ref 12.0–15.0)
MCH: 28.6 pg (ref 26.0–34.0)
MCHC: 32.4 g/dL (ref 30.0–36.0)
MCV: 88.5 fL (ref 78.0–100.0)
Platelets: 142 10*3/uL — ABNORMAL LOW (ref 150–400)
RBC: 3.84 MIL/uL — AB (ref 3.87–5.11)
RDW: 14.5 % (ref 11.5–15.5)
WBC: 4.8 10*3/uL (ref 4.0–10.5)

## 2014-08-18 LAB — URINE CULTURE: Culture: >=100000

## 2014-08-18 LAB — PROTIME-INR
INR: 3.11 — ABNORMAL HIGH (ref 0.00–1.49)
Prothrombin Time: 31.4 seconds — ABNORMAL HIGH (ref 11.6–15.2)

## 2014-08-18 LAB — GLUCOSE, CAPILLARY
GLUCOSE-CAPILLARY: 156 mg/dL — AB (ref 65–99)
GLUCOSE-CAPILLARY: 127 mg/dL — AB (ref 65–99)
Glucose-Capillary: 117 mg/dL — ABNORMAL HIGH (ref 65–99)
Glucose-Capillary: 132 mg/dL — ABNORMAL HIGH (ref 65–99)

## 2014-08-18 LAB — LACTIC ACID, PLASMA
Lactic Acid, Venous: 1.3 mmol/L (ref 0.5–2.0)
Lactic Acid, Venous: 1.2 mmol/L (ref 0.5–2.0)

## 2014-08-18 MED ORDER — IMIPENEM-CILASTATIN 250 MG IV SOLR
250.0000 mg | Freq: Four times a day (QID) | INTRAVENOUS | Status: DC
  Administered 2014-08-18 – 2014-08-22 (×16): 250 mg via INTRAVENOUS
  Filled 2014-08-18 (×19): qty 250

## 2014-08-18 MED ORDER — WARFARIN SODIUM 1 MG PO TABS
1.0000 mg | ORAL_TABLET | Freq: Once | ORAL | Status: AC
  Administered 2014-08-18: 1 mg via ORAL
  Filled 2014-08-18: qty 1

## 2014-08-18 NOTE — Telephone Encounter (Signed)
Call received from patient's daughter [name].  Observed patient is currently hospitalized.  "We're calling to request appointment with Dr. Lindi Adie."  Tried further assessment and after 15 minutes of questions learned patient's "left breast is getting larger.  Pain to breast comes and goes which requires Tylenol or tramadol but not a new pain.  A nephew sent information to Dr. Lindi Adie which requires a face to face meeting to discuss Mom's treatment with him.  We all need explanations for a better understanding of the estrogen block, milk duct tumor, DCIS.  Next F/U in January is too far away.  The nephew in [place] needs a two week notification to arrange flight to be here."  Informed [name] the patient must be present.  "She will be present."

## 2014-08-18 NOTE — Progress Notes (Signed)
HEMATOLOGY-ONCOLOGY Courtesy Visit  Patient has known ER Positive DCIS and because of her advanced age, she was not a candidate for surgery. Shes been placed on Anastrozole for palliation. Shes been admitted for UTI-Sepsis.  Lengthy discussion with patients grandson (from [place]) who went to lengths to decribe the link between anastrozole and UTIs and does not want her to remain on this. I spoke to the patient and she wanted to do as her grand son says. So based on her wishes, I will discontinue anastrozole.  Once she gets discharged from hospital, I will consider starting her on Tamoxifen (which is a Selective Estrogen Receptor Modifier) and would be acceptable to the patient and her family.  Please let us know if we can be of any help.

## 2014-08-18 NOTE — Care Management Note (Addendum)
Case Management Note  Patient Details  Name: [name] MRN: [id number] Date of Birth: 03-06-1920  Subjective/Objective:         PTA  from home with daughter admitted with sepsis/uti      Action/Plan: Return to home when medically stable. CM to f/u with d/c needs.  Expected Discharge Date:                  Expected Discharge Plan:  [place]  In-House Referral:     Discharge planning Services  CM Consult  Post Acute Care Choice:    Choice offered to:     DME Arranged:    DME Agency:     HH Arranged:    [place] Agency:     Status of Service:  In process, will continue to follow  Medicare Important Message Given:    Date Medicare IM Given:    Medicare IM give by:    Date Additional Medicare IM Given:    Additional Medicare Important Message give by:     If discussed at [place] of Stay Meetings, dates discussed:    Additional Comments:  Per pt's daughter, requesting [facility] services with [facility] @ d/c, states has used their services (RN,PT,AIDE) recently for mom.  [name] (Daughter) [phone], [name] (Son) [phone]  Sharin Mons, RN 08/18/2014, 1:35 PM -

## 2014-08-18 NOTE — Progress Notes (Addendum)
ANTICOAGULATION CONSULT NOTE - Follow Up Consult  Pharmacy Consult for Coumadin & Imipenem Indication: atrial fibrillation & ESBL UTI  Allergies  Allergen Reactions  . Darvon     Unknown reaction   . Digoxin And Related Nausea Only  . Penicillins Other (See Comments)    Was told had allergy from childhood... Unknown reaction  . Percocet [Oxycodone-Acetaminophen] Other (See Comments)    confused  . Percodan [Oxycodone-Aspirin]   . Vicodin [Hydrocodone-Acetaminophen] Other (See Comments)    confused    Patient Measurements: Height: 5\' 4"  (162.6 cm) Weight: 181 lb 10.5 oz (82.4 kg) IBW/kg (Calculated) : 54.7  Vital Signs: Temp: 98.2 F (36.8 C) (08/02 0800) Temp Source: Oral (08/02 0800) BP: 98/57 mmHg (08/02 0500) Pulse Rate: 66 (08/02 0500)  Labs:  Recent Labs  08/16/14 1419 08/16/14 1636 08/17/14 0238 08/18/14 0240  HGB 13.7  --  11.8* 11.0*  HCT 41.0  --  35.4* 34.0*  PLT 187  --  143* 142*  LABPROT  --  29.9* 34.0* 31.4*  INR  --  2.91* 3.44* 3.11*  CREATININE 1.96*  --  1.60* 1.71*    Estimated Creatinine Clearance: 21.8 mL/min (by C-G formula based on Cr of 1.71).   Assessment: 81 yo female on chronic Coumadin for hx afib. warfarin per Rx - afib. Home dose 3.25 mg daily except 2.5 mg on Wed and Sun with last dose taken on 7/30. INR on admit was therapeutic at 2.91, then went up to 3.44 and now back down to 3.11 after holding dose yesterday. Of note, started on Levaquin on 7/31 for urosepsis.  Goal of Therapy:  INR 2-3 Monitor platelets by anticoagulation protocol: Yes   Plan:  Restart coumadin at 1mg  PO x 1 tonight Monitor daily INR, CBC, s/s of bleed  Caryl Manas J 08/18/2014,10:20 AM  ADDENDUM:  Pt was started on Levaquin for urosepsis. Now day #3 of therapy and urine culture grew out resistant E. Coli. Pharmacy consulted to dose imipenem-cilastatin. Pt is afebrile, WBC 13.8. SCr up to 1.71, CrCl ~2ml/min.   Plan: Start  imipenem-cilastatin 250mg  IV Q6 Monitor clinical picture, renal function F/U LOT

## 2014-08-18 NOTE — Progress Notes (Signed)
[place] TEAM 1 - Stepdown/ICU TEAM Progress Note  [name] AXK:[id number] DOB: [birth year] DOA: 08/16/2014 PCP: Marlou Sa [name], MD  Admit HPI / Brief Narrative: 81 yo BF PMHx Hx Recurrent UTIs, HTN, paroxysmal atrial fibrillation, second-degree AV block, CAD of native artery, chronic combined systolic and diastolic CHF, DVT bilateral upper extremity, diabetes type 2, hiatal hernia, gastric ulcer.  Presented to [facility] ED for evaluation of several days duration of fevers and BP as low 90/60 with intermittent confusion and lethargy. Family stated she had some dysuria and malodorous urine typical for her UTI's.  In ED the pt was easy to arouse but dozed off very quickly, VSS, UA concerning for UTI and with SBP in 80's.   HPI/Subjective: 8/2 A/O 4, negative CP, negative SOB, negative N/V, negative abdominal pain.  Assessment/Plan: Sepsis due to ESBL pyelonephritis  -Antibiotics changed to Primaxin -Continue normal saline 6ml/hr  Acute on chronic renal failure stage 2 -Renal function is slowly improving with volume resuscitation - continue gentle hydration   Transaminitis  -Most consistent with low-grade shock liver, slowly improving  Chronic systolic and diastolic CHF -Previous TTEs has failed to quantify systolic function but have suggested significant impairment in EF as well as confirmed diastolic dysfunction  - exercise caution with volume resuscitation -Echocardiogram pending  Chronic Afib -CHADS 2 vasc score 7 - continue chronic anticoagulation with warfarin -Currently in NSR  Hypothyroidism -Continue Synthroid 50 g daily -TSH pending  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  Code Status: FULL   Code Status: FULL Family Communication: no family present at time of exam Disposition Plan: Resolution  sepsis    Consultants: NA  Procedure/Significant Events: 1/21 echocardiogram;- LVEF moderate to severely depressed with hypokinesis of the lateral, apical, basal inferior walls The basal posterior wall appears akinetice. Anterior wall is not well seen - (grade 1 diastolic dysfunction).  Culture 7/31urine positive ESBL   Antibiotics: Aztreonam 7/31 >stopped 7/31 Levaquin 7/31>> stopped 8/1 Imipenem 8/2>>  DVT prophylaxis: Warfarin   Devices    LINES / TUBES:      Continuous Infusions: . sodium chloride 50 mL/hr at 08/18/14 1236    Objective: VITAL SIGNS: Temp: 97.9 F (36.6 C) (08/02 1130) Temp Source: Oral (08/02 1130) BP: 107/53 mmHg (08/02 0800) Pulse Rate: 71 (08/02 0800) SPO2; FIO2:   Intake/Output Summary (Last 24 hours) at 08/18/14 1713 Last data filed at 08/18/14 1100  Gross per 24 hour  Intake    678 ml  Output      0 ml  Net    678 ml     Exam: General: A/O 4, NAD, No acute respiratory distress Eyes: Negative headache, negative scleral hemorrhage ENT: Negative Runny nose, negative gingival bleeding Neck:  Negative scars, masses, torticollis, lymphadenopathy, JVD Lungs: Clear to auscultation bilaterally without wheezes or crackles Cardiovascular: Regular rate and rhythm without murmur gallop or rub normal S1 and S2 Abdomen: positive LLQ abdominal pain, positive right CVA tenderness, negative dysphagia, nondistended, soft, bowel sounds positive, no rebound, no ascites, no appreciable mass Extremities: No significant cyanosis, clubbing, or edema bilateral lower extremities Psychiatric:  Negative depression, negative anxiety, negative fatigue, negative mania  Neurologic:  Cranial nerves II through XII intact, tongue/uvula midline, all extremities muscle strength 5/5, sensation intact throughout, negative dysarthria, negative expressive aphasia, negative receptive aphasia.      Data Reviewed: Basic Metabolic Panel:  Recent Labs Lab  08/16/14 1419 08/17/14 0238 08/18/14 0240  NA 133* 134* 135  K 4.8 4.0 4.3  CL 96* 101 102  CO2 26 26 26   GLUCOSE 286* 125* 114*  BUN 26* 21* 22*  CREATININE 1.96* 1.60* 1.71*  CALCIUM 9.2 8.6* 8.7*   Liver Function Tests:  Recent Labs Lab 08/16/14 1419 08/17/14 0238 08/18/14 0240  AST 594* 264* 142*  ALT 344* 213* 139*  ALKPHOS 194* 149* 125  BILITOT 1.7* 0.9 0.8  PROT 7.1 6.0* 5.5*  ALBUMIN 3.5 2.7* 2.5*   No results for input(s): LIPASE, AMYLASE in the last 168 hours. No results for input(s): AMMONIA in the last 168 hours. CBC:  Recent Labs Lab 08/16/14 1419 08/17/14 0238 08/18/14 0240  WBC 13.8* 9.5 4.8  NEUTROABS 12.7*  --   --   HGB 13.7 11.8* 11.0*  HCT 41.0 35.4* 34.0*  MCV 87.0 87.4 88.5  PLT 187 143* 142*   Cardiac Enzymes: No results for input(s): CKTOTAL, CKMB, CKMBINDEX, TROPONINI in the last 168 hours. BNP (last 3 results)  Recent Labs  05/24/14 0145 07/21/14 1620  BNP 169.5* 261.6*    ProBNP (last 3 results) No results for input(s): PROBNP in the last 8760 hours.  CBG:  Recent Labs Lab 08/17/14 1613 08/17/14 2126 08/18/14 0808 08/18/14 1202 08/18/14 1548  GLUCAP 133* 111* 117* 132* 156*    Recent Results (from the past 240 hour(s))  Culture, blood (routine x 2)     Status: None (Preliminary result)   Collection Time: 08/16/14  2:19 PM  Result Value Ref Range Status   Specimen Description BLOOD LEFT ANTECUBITAL  Final   Special Requests BOTTLES DRAWN AEROBIC ONLY 2CC  Final   Culture NO GROWTH 2 DAYS  Final   Report Status PENDING  Incomplete  Urine culture     Status: None   Collection Time: 08/16/14  3:21 PM  Result Value Ref Range Status   Specimen Description URINE, CATHETERIZED  Final   Special Requests NONE  Final   Culture   Final    >=100,000 COLONIES/mL ESCHERICHIA COLI Confirmed Extended Spectrum Beta-Lactamase Producer (ESBL)    Report Status 08/18/2014 FINAL  Final   Organism ID, Bacteria ESCHERICHIA COLI   Final      Susceptibility   Escherichia coli - MIC*    AMPICILLIN >=32 RESISTANT Resistant     CEFAZOLIN >=64 RESISTANT Resistant     CEFTRIAXONE 8 RESISTANT Resistant     CIPROFLOXACIN >=4 RESISTANT Resistant     GENTAMICIN <=1 SENSITIVE Sensitive     IMIPENEM <=0.25 SENSITIVE Sensitive     NITROFURANTOIN 32 SENSITIVE Sensitive     TRIMETH/SULFA >=320 RESISTANT Resistant     AMPICILLIN/SULBACTAM 4 SENSITIVE Sensitive     PIP/TAZO <=4 SENSITIVE Sensitive     * >=100,000 COLONIES/mL ESCHERICHIA COLI  Culture, blood (routine x 2)     Status: None (Preliminary result)   Collection Time: 08/16/14  3:40 PM  Result Value Ref Range Status   Specimen Description BLOOD RIGHT HAND  Final   Special Requests BOTTLES DRAWN AEROBIC ONLY 3CC  Final   Culture NO GROWTH 2 DAYS  Final   Report Status PENDING  Incomplete     Studies:  Recent x-ray studies have been reviewed in detail by the Attending Physician  Scheduled Meds:  Scheduled Meds: . aspirin  81 mg Oral Daily  . atropine  1 drop Right Eye QID  . donepezil  5 mg Oral QHS  .  gabapentin  100 mg Oral BID  . imipenem-cilastatin  250 mg Intravenous 4 times per day  . insulin aspart  0-9 Units Subcutaneous TID WC  . levothyroxine  50 mcg Oral QAC breakfast  . nitroGLYCERIN  0.2 mg Transdermal Daily  . pantoprazole  40 mg Oral Daily  . potassium chloride  10 mEq Oral BID  . ranolazine  500 mg Oral BID  . warfarin  1 mg Oral ONCE-1800  . Warfarin - Pharmacist Dosing Inpatient   Does not apply q1800  . zolpidem  5 mg Oral QHS    Time spent on care of this patient: 40 mins   WOODS, Geraldo Docker , MD  [facility] Office  [phone] Pager - [phone]  On-Call/Text Page:      [email or web address]      password [email or web address]  If 7PM-7AM, please contact night-coverage [email or web address] Password [email or web address] 08/18/2014, 5:13 PM   LOS: 2 days   Care during the described time interval was provided by me .  I have reviewed this patient's available  data, including medical history, events of note, physical examination, and all test results as part of my evaluation. I have personally reviewed and interpreted all radiology studies.   Dia Crawford, MD [phone] Pager

## 2014-08-18 NOTE — Telephone Encounter (Signed)
Returned call to [place] per message below.  Let [name] know Dr. Lindi Adie would be by the hospital to see her mother.  Also arranged with [name] for Dr. Lindi Adie to call her nephew.  [name] provided info: Darrold Junker [phone].  Info provided to Dr. Lindi Adie.       [name]  08/18/2014  Telephone  MRN:  [id number]   Description: 81 year old female  Provider: Cherylynn Ridges, RN  Department: [facility]       Reason for Call     Appointment    Request face to face          Call Documentation      Cherylynn Ridges, RN at 08/18/2014 10:33 AM     Status: Signed       Expand All Collapse All   Call received from patient's daughter [name]. Observed patient is currently hospitalized. "We're calling to request appointment with Dr. Lindi Adie." Tried further assessment and after 15 minutes of questions learned patient's "left breast is getting larger. Pain to breast comes and goes which requires Tylenol or tramadol but not a new pain. A nephew sent information to Dr. Lindi Adie which requires a face to face meeting to discuss Mom's treatment with him. We all need explanations for a better understanding of the estrogen block, milk duct tumor, DCIS. Next F/U in January is too far away. The nephew in [place] needs a two week notification to arrange flight to be here." Informed [name] the patient must be present. "She will be present."

## 2014-08-19 ENCOUNTER — Inpatient Hospital Stay (HOSPITAL_COMMUNITY): Payer: Medicare Other

## 2014-08-19 ENCOUNTER — Ambulatory Visit: Payer: Medicare Other | Admitting: Pharmacist Clinician (PhC)/ Clinical Pharmacy Specialist

## 2014-08-19 DIAGNOSIS — I509 Heart failure, unspecified: Secondary | ICD-10-CM

## 2014-08-19 LAB — CBC WITH DIFFERENTIAL/PLATELET
Basophils Absolute: 0 10*3/uL (ref 0.0–0.1)
Basophils Relative: 1 % (ref 0–1)
Eosinophils Absolute: 0.1 10*3/uL (ref 0.0–0.7)
Eosinophils Relative: 2 % (ref 0–5)
HCT: 33.3 % — ABNORMAL LOW (ref 36.0–46.0)
HEMOGLOBIN: 10.8 g/dL — AB (ref 12.0–15.0)
LYMPHS ABS: 1.8 10*3/uL (ref 0.7–4.0)
Lymphocytes Relative: 42 % (ref 12–46)
MCH: 28.4 pg (ref 26.0–34.0)
MCHC: 32.4 g/dL (ref 30.0–36.0)
MCV: 87.6 fL (ref 78.0–100.0)
MONO ABS: 0.4 10*3/uL (ref 0.1–1.0)
Monocytes Relative: 10 % (ref 3–12)
Neutro Abs: 1.9 10*3/uL (ref 1.7–7.7)
Neutrophils Relative %: 45 % (ref 43–77)
Platelets: 143 10*3/uL — ABNORMAL LOW (ref 150–400)
RBC: 3.8 MIL/uL — AB (ref 3.87–5.11)
RDW: 14.4 % (ref 11.5–15.5)
WBC: 4.2 10*3/uL (ref 4.0–10.5)

## 2014-08-19 LAB — COMPREHENSIVE METABOLIC PANEL
ALK PHOS: 109 U/L (ref 38–126)
ALT: 94 U/L — ABNORMAL HIGH (ref 14–54)
AST: 79 U/L — ABNORMAL HIGH (ref 15–41)
Albumin: 2.5 g/dL — ABNORMAL LOW (ref 3.5–5.0)
Anion gap: 4 — ABNORMAL LOW (ref 5–15)
BUN: 16 mg/dL (ref 6–20)
CHLORIDE: 107 mmol/L (ref 101–111)
CO2: 25 mmol/L (ref 22–32)
Calcium: 8.5 mg/dL — ABNORMAL LOW (ref 8.9–10.3)
Creatinine, Ser: 1.31 mg/dL — ABNORMAL HIGH (ref 0.44–1.00)
GFR calc Af Amer: 40 mL/min — ABNORMAL LOW (ref >60)
GFR, EST NON AFRICAN AMERICAN: 34 mL/min — AB (ref >60)
GLUCOSE: 107 mg/dL — AB (ref 65–99)
Potassium: 4.2 mmol/L (ref 3.5–5.1)
Sodium: 136 mmol/L (ref 135–145)
TOTAL PROTEIN: 5.4 g/dL — AB (ref 6.5–8.1)
Total Bilirubin: 0.7 mg/dL (ref 0.3–1.2)

## 2014-08-19 LAB — GLUCOSE, CAPILLARY
Glucose-Capillary: 104 mg/dL — ABNORMAL HIGH (ref 65–99)
Glucose-Capillary: 125 mg/dL — ABNORMAL HIGH (ref 65–99)
Glucose-Capillary: 100 mg/dL — ABNORMAL HIGH (ref 65–99)
Glucose-Capillary: 149 mg/dL — ABNORMAL HIGH (ref 65–99)

## 2014-08-19 LAB — TSH: TSH: 3.116 u[IU]/mL (ref 0.350–4.500)

## 2014-08-19 LAB — MAGNESIUM: MAGNESIUM: 2 mg/dL (ref 1.7–2.4)

## 2014-08-19 LAB — PROTIME-INR
INR: 2.43 — AB (ref 0.00–1.49)
PROTHROMBIN TIME: 26.1 s — AB (ref 11.6–15.2)

## 2014-08-19 MED ORDER — ACETAMINOPHEN 500 MG PO TABS
500.0000 mg | ORAL_TABLET | Freq: Four times a day (QID) | ORAL | Status: DC | PRN
  Administered 2014-08-19 – 2014-08-20 (×2): 500 mg via ORAL
  Filled 2014-08-19 (×2): qty 1

## 2014-08-19 MED ORDER — WARFARIN SODIUM 2.5 MG PO TABS
2.5000 mg | ORAL_TABLET | Freq: Once | ORAL | Status: AC
  Administered 2014-08-19: 2.5 mg via ORAL
  Filled 2014-08-19: qty 1

## 2014-08-19 NOTE — Progress Notes (Signed)
Pt transffered to the unit. Pt is stable, alert per baseline. Family oriented to room, staff, and call bell. Educated to call for any assistance. Bed in lowest position, call bell within reach- will continue to monitor.

## 2014-08-19 NOTE — Progress Notes (Addendum)
Medicare Important Message given? YES (If response is "NO", the following Medicare IM given date fields will be blank) Date Medicare IM given:08/19/14 Medicare IM given by: Whitman Hero

## 2014-08-19 NOTE — Progress Notes (Signed)
  Echocardiogram 2D Echocardiogram has been performed.  [name] 08/19/2014, 9:13 AM

## 2014-08-19 NOTE — Progress Notes (Signed)
Report called to [place], RN

## 2014-08-19 NOTE — Progress Notes (Signed)
ANTICOAGULATION CONSULT NOTE - Follow Up Consult  Pharmacy Consult for Coumadin Indication: Afib  Allergies  Allergen Reactions  . Darvon     Unknown reaction   . Digoxin And Related Nausea Only  . Penicillins Other (See Comments)    Was told had allergy from childhood... Unknown reaction  . Percocet [Oxycodone-Acetaminophen] Other (See Comments)    confused  . Percodan [Oxycodone-Aspirin]   . Vicodin [Hydrocodone-Acetaminophen] Other (See Comments)    confused    Patient Measurements: Height: 5\' 4"  (162.6 cm) Weight: 181 lb 10.5 oz (82.4 kg) IBW/kg (Calculated) : 54.7  Vital Signs: Temp: 97.8 F (36.6 C) (08/03 0402) Temp Source: Oral (08/03 0402) BP: 123/79 mmHg (08/03 0405) Pulse Rate: 68 (08/03 0415)  Labs:  Recent Labs  08/17/14 0238 08/18/14 0240 08/19/14 0250  HGB 11.8* 11.0* 10.8*  HCT 35.4* 34.0* 33.3*  PLT 143* 142* 143*  LABPROT 34.0* 31.4* 26.1*  INR 3.44* 3.11* 2.43*  CREATININE 1.60* 1.71* 1.31*    Estimated Creatinine Clearance: 28.5 mL/min (by C-G formula based on Cr of 1.31).   Assessment: 81 yo female on chronic Coumadin for hx afib. Home dose 3.25 mg daily except 2.5 mg on Wed and Sun. INR on admit was therapeutic at 2.91, then went up to 3.44 and now back down to 2.43 after holding for a day and restarting 1mg  last night. Hgb low at 10.8 plts 143. No s/s of bleed  Goal of Therapy:  INR 2-3 Monitor platelets by anticoagulation protocol: Yes   Plan:  Give coumadin 2.5mg  PO x 1 tonight Monitor daily INR, CBC, s/s of bleed  Jamaul Heist J 08/19/2014,8:16 AM

## 2014-08-19 NOTE — Progress Notes (Addendum)
TEAM 1 - Stepdown/ICU TEAM Progress Note  [name] QTM:[id number] DOB: Nov 29, 1920 DOA: 08/16/2014 PCP: Marlou Sa [name], MD  Admit HPI / Brief Narrative: 81 yo female with hx of recurrent UTIs who presented to [facility] ED for evaluation of several days duration of fevers and BP as low 90/60 with intermittent confusion and lethargy. Family stated she had some dysuria and malodorous urine typical for her UTI's.  In ED the pt was easy to arouse but dozed off very quickly, VSS, UA concerning for UTI and with SBP in 80's.  HPI/Subjective: The patient is resting comfortably in bed.  She states she feels better overall.  She has no new complaints.  She denies chest pain shortness of breath nausea vomiting or abdominal pain.  Assessment/Plan:  Sepsis due to multi drug resistant E coli pyelonephritis  Continue focused IV antibiotic therapy for this organism that is resistant to most commonly available antibiotics  Acute on chronic renal failure stage 2 Renal function continues to slowly improve with volume resuscitation - follow trend   Transaminitis  Most consistent with low-grade shock liver - imaging of liver parenchyma unrevealing - improving with hemodynamic support   Chronic systolic and diastolic CHF EF this admit confirmed to be 30-35% w/ multiple areas of hypokinesis and grade 1 DD - exercise caution with volume resuscitation  Chronic Afib CHADS 2 vasc score 7 - continue chronic anticoagulation with warfarin  Hypothyroidism  DM2 w/ renal complications CBG well controlled at this time - follow trend  Obesity - Body mass index is 31.17 kg/(m^2).  Code Status: FULL Family Communication: Spoke with daughter at bedside Disposition Plan: stable for transfer to medical bed - PT/OT   Consultants: none  Procedures: TTE - 8/3   Antibiotics: Aztreonam 7/31  Levaquin 7/31 Imipenem 8/2 >   DVT prophylaxis: warfarin  Objective: Blood pressure 119/71, pulse 68,  temperature 97.9 F (36.6 C), temperature source Oral, resp. rate 15, height 5\' 4"  (1.626 m), weight 82.4 kg (181 lb 10.5 oz), SpO2 97 %.  Intake/Output Summary (Last 24 hours) at 08/19/14 1405 Last data filed at 08/19/14 0800  Gross per 24 hour  Intake 2790.83 ml  Output      0 ml  Net 2790.83 ml   Exam: General: No acute respiratory distress - alert and conversant  Lungs: Clear to auscultation bilaterally without wheezes or crackles Cardiovascular: Regular rate and rhythm without murmur gallop or rub Abdomen: Nontender, overweight, soft, bowel sounds positive, no rebound, no appreciable mass Extremities: No significant cyanosis, clubbing, edema bilateral lower extremities  Data Reviewed: Basic Metabolic Panel:  Recent Labs Lab 08/16/14 1419 08/17/14 0238 08/18/14 0240 08/19/14 0250  NA 133* 134* 135 136  K 4.8 4.0 4.3 4.2  CL 96* 101 102 107  CO2 26 26 26 25   GLUCOSE 286* 125* 114* 107*  BUN 26* 21* 22* 16  CREATININE 1.96* 1.60* 1.71* 1.31*  CALCIUM 9.2 8.6* 8.7* 8.5*  MG  --   --   --  2.0    CBC:  Recent Labs Lab 08/16/14 1419 08/17/14 0238 08/18/14 0240 08/19/14 0250  WBC 13.8* 9.5 4.8 4.2  NEUTROABS 12.7*  --   --  1.9  HGB 13.7 11.8* 11.0* 10.8*  HCT 41.0 35.4* 34.0* 33.3*  MCV 87.0 87.4 88.5 87.6  PLT 187 143* 142* 143*    Liver Function Tests:  Recent Labs Lab 08/16/14 1419 08/17/14 0238 08/18/14 0240 08/19/14 0250  AST 594* 264* 142* 79*  ALT 344*  213* 139* 94*  ALKPHOS 194* 149* 125 109  BILITOT 1.7* 0.9 0.8 0.7  PROT 7.1 6.0* 5.5* 5.4*  ALBUMIN 3.5 2.7* 2.5* 2.5*    Coags:  Recent Labs Lab 08/16/14 1636 08/17/14 0238 08/18/14 0240 08/19/14 0250  INR 2.91* 3.44* 3.11* 2.43*   CBG:  Recent Labs Lab 08/18/14 1202 08/18/14 1548 08/18/14 2140 08/19/14 0735 08/19/14 1207  GLUCAP 132* 156* 127* 104* 125*    Recent Results (from the past 240 hour(s))  Culture, blood (routine x 2)     Status: None (Preliminary result)    Collection Time: 08/16/14  2:19 PM  Result Value Ref Range Status   Specimen Description BLOOD LEFT ANTECUBITAL  Final   Special Requests BOTTLES DRAWN AEROBIC ONLY 2CC  Final   Culture NO GROWTH 2 DAYS  Final   Report Status PENDING  Incomplete  Urine culture     Status: None   Collection Time: 08/16/14  3:21 PM  Result Value Ref Range Status   Specimen Description URINE, CATHETERIZED  Final   Special Requests NONE  Final   Culture   Final    >=100,000 COLONIES/mL ESCHERICHIA COLI Confirmed Extended Spectrum Beta-Lactamase Producer (ESBL)    Report Status 08/18/2014 FINAL  Final   Organism ID, Bacteria ESCHERICHIA COLI  Final      Susceptibility   Escherichia coli - MIC*    AMPICILLIN >=32 RESISTANT Resistant     CEFAZOLIN >=64 RESISTANT Resistant     CEFTRIAXONE 8 RESISTANT Resistant     CIPROFLOXACIN >=4 RESISTANT Resistant     GENTAMICIN <=1 SENSITIVE Sensitive     IMIPENEM <=0.25 SENSITIVE Sensitive     NITROFURANTOIN 32 SENSITIVE Sensitive     TRIMETH/SULFA >=320 RESISTANT Resistant     AMPICILLIN/SULBACTAM 4 SENSITIVE Sensitive     PIP/TAZO <=4 SENSITIVE Sensitive     * >=100,000 COLONIES/mL ESCHERICHIA COLI  Culture, blood (routine x 2)     Status: None (Preliminary result)   Collection Time: 08/16/14  3:40 PM  Result Value Ref Range Status   Specimen Description BLOOD RIGHT HAND  Final   Special Requests BOTTLES DRAWN AEROBIC ONLY 3CC  Final   Culture NO GROWTH 2 DAYS  Final   Report Status PENDING  Incomplete     Studies:   Recent x-ray studies have been reviewed in detail by the Attending Physician  Scheduled Meds:  Scheduled Meds: . aspirin  81 mg Oral Daily  . atropine  1 drop Right Eye QID  . donepezil  5 mg Oral QHS  . gabapentin  100 mg Oral BID  . imipenem-cilastatin  250 mg Intravenous 4 times per day  . insulin aspart  0-9 Units Subcutaneous TID WC  . levothyroxine  50 mcg Oral QAC breakfast  . nitroGLYCERIN  0.2 mg Transdermal Daily  .  pantoprazole  40 mg Oral Daily  . potassium chloride  10 mEq Oral BID  . ranolazine  500 mg Oral BID  . warfarin  2.5 mg Oral ONCE-1800  . Warfarin - Pharmacist Dosing Inpatient   Does not apply q1800  . zolpidem  5 mg Oral QHS    Time spent on care of this patient: 35 mins   [name] , MD   [facility] Office  [phone] Pager - Text Page per Shea Evans as per below:  On-Call/Text Page:      [email or web address]      password [email or web address]  If 7PM-7AM, please contact night-coverage [email or web address] Password [email or web address] 08/19/2014, 2:05 PM  LOS: 3 days

## 2014-08-20 DIAGNOSIS — A4151 Sepsis due to Escherichia coli [E. coli]: Secondary | ICD-10-CM

## 2014-08-20 DIAGNOSIS — A499 Bacterial infection, unspecified: Secondary | ICD-10-CM

## 2014-08-20 DIAGNOSIS — I482 Chronic atrial fibrillation: Secondary | ICD-10-CM

## 2014-08-20 DIAGNOSIS — Z1612 Extended spectrum beta lactamase (ESBL) resistance: Secondary | ICD-10-CM

## 2014-08-20 DIAGNOSIS — E1165 Type 2 diabetes mellitus with hyperglycemia: Secondary | ICD-10-CM

## 2014-08-20 LAB — CBC
HCT: 35.7 % — ABNORMAL LOW (ref 36.0–46.0)
HEMOGLOBIN: 11.7 g/dL — AB (ref 12.0–15.0)
MCH: 28.7 pg (ref 26.0–34.0)
MCHC: 32.8 g/dL (ref 30.0–36.0)
MCV: 87.7 fL (ref 78.0–100.0)
PLATELETS: 173 10*3/uL (ref 150–400)
RBC: 4.07 MIL/uL (ref 3.87–5.11)
RDW: 14.5 % (ref 11.5–15.5)
WBC: 5 10*3/uL (ref 4.0–10.5)

## 2014-08-20 LAB — COMPREHENSIVE METABOLIC PANEL
ALBUMIN: 2.6 g/dL — AB (ref 3.5–5.0)
ALK PHOS: 104 U/L (ref 38–126)
ALT: 66 U/L — AB (ref 14–54)
AST: 48 U/L — ABNORMAL HIGH (ref 15–41)
Anion gap: 6 (ref 5–15)
BUN: 11 mg/dL (ref 6–20)
CALCIUM: 8.7 mg/dL — AB (ref 8.9–10.3)
CO2: 23 mmol/L (ref 22–32)
CREATININE: 1.08 mg/dL — AB (ref 0.44–1.00)
Chloride: 107 mmol/L (ref 101–111)
GFR calc Af Amer: 50 mL/min — ABNORMAL LOW (ref >60)
GFR, EST NON AFRICAN AMERICAN: 43 mL/min — AB (ref >60)
Glucose, Bld: 127 mg/dL — ABNORMAL HIGH (ref 65–99)
Potassium: 4.4 mmol/L (ref 3.5–5.1)
Sodium: 136 mmol/L (ref 135–145)
TOTAL PROTEIN: 5.9 g/dL — AB (ref 6.5–8.1)
Total Bilirubin: 0.6 mg/dL (ref 0.3–1.2)

## 2014-08-20 LAB — GLUCOSE, CAPILLARY
GLUCOSE-CAPILLARY: 111 mg/dL — AB (ref 65–99)
GLUCOSE-CAPILLARY: 123 mg/dL — AB (ref 65–99)
GLUCOSE-CAPILLARY: 121 mg/dL — AB (ref 65–99)
Glucose-Capillary: 107 mg/dL — ABNORMAL HIGH (ref 65–99)

## 2014-08-20 LAB — PROTIME-INR
INR: 1.76 — AB (ref 0.00–1.49)
Prothrombin Time: 20.5 seconds — ABNORMAL HIGH (ref 11.6–15.2)

## 2014-08-20 MED ORDER — ZOLPIDEM TARTRATE 5 MG PO TABS
2.5000 mg | ORAL_TABLET | Freq: Every day | ORAL | Status: DC
  Administered 2014-08-20 – 2014-08-21 (×2): 2.5 mg via ORAL
  Filled 2014-08-20 (×2): qty 1

## 2014-08-20 MED ORDER — WARFARIN SODIUM 2 MG PO TABS
3.2500 mg | ORAL_TABLET | Freq: Once | ORAL | Status: AC
  Administered 2014-08-20: 3.25 mg via ORAL
  Filled 2014-08-20: qty 1

## 2014-08-20 NOTE — Progress Notes (Signed)
PT Cancellation Note  Patient Details Name: [name] MRN: [id number] DOB: 08/06/1920   Cancelled Treatment:    Reason Eval/Treat Not Completed: Other (comment) (per nursing pt had just returned to bed after being up in chair.) Will re-attempt tomorrow.   Lucile Hillmann 08/20/2014, 4:40 PM  [place]

## 2014-08-20 NOTE — Progress Notes (Signed)
PROGRESS NOTE  [name] MLY:[id number] DOB: 1920-09-28 DOA: 08/16/2014 PCP: Kevan Ny, MD   Admit HPI / Brief Narrative: 81 yo female with hx of recurrent UTIs who presented to [facility] ED for evaluation of several days duration of fevers and BP as low 90/60 with intermittent confusion and lethargy. Family stated she had some dysuria and malodorous urine typical for her UTI's.  In ED the pt was easy to arouse but dozed off very quickly, VSS, UA concerning for UTI and with SBP in 80's.  HPI/Subjective: The patient is resting comfortably in bed. She states she feels better overall. She has no new complaints. She denies chest pain shortness of breath nausea vomiting or abdominal pain.  She still has some intermittent dysuria.  Assessment/Plan:  Sepsis due to multi drug resistant E coli (ESBL) pyelonephritis  Continue focused IV antibiotic therapy for this organism that is resistant to most commonly available antibiotics -Continue imipenem--plan 14 days total  Acute on chronic renal failure stage 3 Renal function continues to improve with volume resuscitation - follow trend  -Renal function back to baseline  -Baseline creatinine 1.0-1.3  Transaminitis  Most consistent with low-grade shock liver - imaging of liver parenchyma unrevealing - improving with hemodynamic support   Chronic systolic and diastolic CHF EF this admit confirmed to be 30-35% w/ multiple areas of hypokinesis and grade 1 DD - exercise caution with volume resuscitation  Chronic Afib CHADS 2 vasc score 7 - continue chronic anticoagulation with warfarin  Hypothyroidism  DM2 w/ renal complications CBG well controlled at this time - follow trend -05/25/2014 hemoglobin A1c 7.3   Obesity - Body mass index is 31.17 kg/(m^2).  Code Status: FULL Family Communication: Spoke with daughter at bedside Disposition Plan: SNF in 24 hours if stable  Consultants: none  Procedures: TTE - 8/3    Antibiotics: Aztreonam 7/31  Levaquin 7/31 Imipenem 8/2 >   DVT prophylaxis: warfarin  Procedures/Studies: Ct Head Wo Contrast  07/21/2014   CLINICAL DATA:  Patient with increased confusion.  No headache.  EXAM: CT HEAD WITHOUT CONTRAST  TECHNIQUE: Contiguous axial images were obtained from the base of the skull through the vertex without intravenous contrast.  COMPARISON:  Brain CT 03/14/2011  FINDINGS: Ventricles and sulci are prominent compatible with atrophy. Periventricular and subcortical white matter hypodensity compatible with chronic small vessel ischemic changes. Bilateral basal ganglia calcifications. Old left basal ganglia lacunar infarct. No evidence for acute cortically based infarct, intracranial hemorrhage, mass lesion or mass-effect. Paranasal sinuses are well aerated. Mastoid air cells are unremarkable. Calvarium is intact.  IMPRESSION: No acute intracranial process.  Chronic small vessel ischemic changes and cerebral atrophy.   Electronically Signed   By: Lovey Newcomer M.D.   On: 07/21/2014 20:01   US Abdomen Complete  08/17/2014   CLINICAL DATA:  Transaminitis.  Diabetes.  EXAM: ULTRASOUND ABDOMEN COMPLETE  COMPARISON:  Ultrasound of the abdomen 02/05/2014; CT of the abdomen 02/04/2014  FINDINGS: Gallbladder: Gallbladder is distended. Gallbladder sludge is present. Gallbladder wall is upper limits normal at 2.4 mm. No pericholecystic fluid or sonographic Murphy's sign.  Common bile duct: Diameter: 4.1 mm  Liver: No focal lesion identified. Within normal limits in parenchymal echogenicity.  IVC: No abnormality visualized.  Pancreas: Visualized portion unremarkable.  Spleen: Size and appearance within normal limits.  Right Kidney: Length: 10.7 cm. Echogenicity is normal. No mass or hydronephrosis. Mild renal parenchymal thinning.  Left Kidney: Length: 11.2 cm. Echogenicity  within normal limits. No mass or hydronephrosis visualized. Mild renal parenchymal thinning.  Abdominal aorta:  Visualized portion not aneurysmal, 2.4 cm.  Other findings: None.  IMPRESSION: 1. No evidence for acute cholecystitis. However the gallbladder is distended and sludge is present, consistent with biliary stasis. 2. Mild renal parenchymal thinning without evidence for acute renal abnormality.   Electronically Signed   By: Nolon Nations M.D.   On: 08/17/2014 09:35   Dg Chest Port 1 View  08/16/2014   CLINICAL DATA:  Sepsis, hypertension, diabetes.  EXAM: PORTABLE CHEST - 1 VIEW  COMPARISON:  05/24/2014  FINDINGS: Sternotomy wires and left-sided pacemaker unchanged. Lungs are hypoinflated without focal consolidation or effusion. There subtle prominence of the perihilar markings which may be due to mild degree of vascular congestion. There is borderline stable cardiomegaly. Remainder of the exam is unchanged.  IMPRESSION: Hypoinflation with borderline stable cardiomegaly and possible mild vascular congestion.   Electronically Signed   By: Marin Olp M.D.   On: 08/16/2014 15:27           Objective: Filed Vitals:   08/19/14 1935 08/19/14 2100 08/19/14 2306 08/20/14 0536  BP:  112/60 143/75 145/72  Pulse:   74 76  Temp: 99 F (37.2 C)  98.3 F (36.8 C) 98.5 F (36.9 C)  TempSrc: Oral  Oral Oral  Resp:   18 18  Height:   5\' 4"  (1.626 m)   Weight:   85.73 kg (189 lb) 85.73 kg (189 lb)  SpO2: 97% 98% 99% 100%    Intake/Output Summary (Last 24 hours) at 08/20/14 1307 Last data filed at 08/20/14 1158  Gross per 24 hour  Intake 2123.33 ml  Output      0 ml  Net 2123.33 ml   Weight change:  Exam:   General:  Pt is alert, follows commands appropriately, not in acute distress  HEENT: No icterus, No thrush,  [place]/AT  Cardiovascular: RRR, S1/S2, no rubs, no gallops  Respiratory: CTA bilaterally, no wheezing, no crackles, no rhonchi  Abdomen: Soft/+BS, non tender, non distended, no guarding  Extremities: 1+LE edema, No lymphangitis, No petechiae, No rashes, no synovitis  Data  Reviewed: Basic Metabolic Panel:  Recent Labs Lab 08/16/14 1419 08/17/14 0238 08/18/14 0240 08/19/14 0250 08/20/14 0553  NA 133* 134* 135 136 136  K 4.8 4.0 4.3 4.2 4.4  CL 96* 101 102 107 107  CO2 26 26 26 25 23   GLUCOSE 286* 125* 114* 107* 127*  BUN 26* 21* 22* 16 11  CREATININE 1.96* 1.60* 1.71* 1.31* 1.08*  CALCIUM 9.2 8.6* 8.7* 8.5* 8.7*  MG  --   --   --  2.0  --    Liver Function Tests:  Recent Labs Lab 08/16/14 1419 08/17/14 0238 08/18/14 0240 08/19/14 0250 08/20/14 0553  AST 594* 264* 142* 79* 48*  ALT 344* 213* 139* 94* 66*  ALKPHOS 194* 149* 125 109 104  BILITOT 1.7* 0.9 0.8 0.7 0.6  PROT 7.1 6.0* 5.5* 5.4* 5.9*  ALBUMIN 3.5 2.7* 2.5* 2.5* 2.6*   No results for input(s): LIPASE, AMYLASE in the last 168 hours. No results for input(s): AMMONIA in the last 168 hours. CBC:  Recent Labs Lab 08/16/14 1419 08/17/14 0238 08/18/14 0240 08/19/14 0250 08/20/14 0553  WBC 13.8* 9.5 4.8 4.2 5.0  NEUTROABS 12.7*  --   --  1.9  --   HGB 13.7 11.8* 11.0* 10.8* 11.7*  HCT 41.0 35.4* 34.0* 33.3* 35.7*  MCV 87.0 87.4 88.5 87.6  87.7  PLT 187 143* 142* 143* 173   Cardiac Enzymes: No results for input(s): CKTOTAL, CKMB, CKMBINDEX, TROPONINI in the last 168 hours. BNP: Invalid input(s): POCBNP CBG:  Recent Labs Lab 08/19/14 1207 08/19/14 1753 08/19/14 1932 08/20/14 0746 08/20/14 1148  GLUCAP 125* 100* 149* 111* 107*    Recent Results (from the past 240 hour(s))  Culture, blood (routine x 2)     Status: None (Preliminary result)   Collection Time: 08/16/14  2:19 PM  Result Value Ref Range Status   Specimen Description BLOOD LEFT ANTECUBITAL  Final   Special Requests BOTTLES DRAWN AEROBIC ONLY 2CC  Final   Culture NO GROWTH 3 DAYS  Final   Report Status PENDING  Incomplete  Urine culture     Status: None   Collection Time: 08/16/14  3:21 PM  Result Value Ref Range Status   Specimen Description URINE, CATHETERIZED  Final   Special Requests NONE  Final    Culture   Final    >=100,000 COLONIES/mL ESCHERICHIA COLI Confirmed Extended Spectrum Beta-Lactamase Producer (ESBL)    Report Status 08/18/2014 FINAL  Final   Organism ID, Bacteria ESCHERICHIA COLI  Final      Susceptibility   Escherichia coli - MIC*    AMPICILLIN >=32 RESISTANT Resistant     CEFAZOLIN >=64 RESISTANT Resistant     CEFTRIAXONE 8 RESISTANT Resistant     CIPROFLOXACIN >=4 RESISTANT Resistant     GENTAMICIN <=1 SENSITIVE Sensitive     IMIPENEM <=0.25 SENSITIVE Sensitive     NITROFURANTOIN 32 SENSITIVE Sensitive     TRIMETH/SULFA >=320 RESISTANT Resistant     AMPICILLIN/SULBACTAM 4 SENSITIVE Sensitive     PIP/TAZO <=4 SENSITIVE Sensitive     * >=100,000 COLONIES/mL ESCHERICHIA COLI  Culture, blood (routine x 2)     Status: None (Preliminary result)   Collection Time: 08/16/14  3:40 PM  Result Value Ref Range Status   Specimen Description BLOOD RIGHT HAND  Final   Special Requests BOTTLES DRAWN AEROBIC ONLY 3CC  Final   Culture NO GROWTH 3 DAYS  Final   Report Status PENDING  Incomplete     Scheduled Meds: . aspirin  81 mg Oral Daily  . atropine  1 drop Right Eye QID  . donepezil  5 mg Oral QHS  . gabapentin  100 mg Oral BID  . imipenem-cilastatin  250 mg Intravenous 4 times per day  . insulin aspart  0-9 Units Subcutaneous TID WC  . levothyroxine  50 mcg Oral QAC breakfast  . nitroGLYCERIN  0.2 mg Transdermal Daily  . pantoprazole  40 mg Oral Daily  . potassium chloride  10 mEq Oral BID  . ranolazine  500 mg Oral BID  . warfarin  3.25 mg Oral ONCE-1800  . Warfarin - Pharmacist Dosing Inpatient   Does not apply q1800  . zolpidem  5 mg Oral QHS   Continuous Infusions: . sodium chloride 50 mL/hr at 08/19/14 1534     Camylle Whicker, DO  [facility] Pager [phone]  If 7PM-7AM, please contact night-coverage [email or web address] Password [email or web address] 08/20/2014, 1:07 PM   LOS: 4 days

## 2014-08-20 NOTE — Evaluation (Signed)
Occupational Therapy Evaluation Patient Details Name: [name] MRN: [id number] DOB: 02-Nov-1920 Today's Date: 08/20/2014    History of Present Illness 81 yo female with hx of recurrent UTIs who presented to [facility] ED for evaluation of several days duration of fevers and BP as low 90/60 with intermittent confusion and lethargy. Family stated she had some dysuria and malodorous urine typical for her UTI's.  Pt diagnosesed with Sepsis due to ESBL pyelonephritis    Clinical Impression   Pt currently max assist for supine to sit EOB with mod assist for sit to stand and for simulated toilet transfer.  Pt also with increased dizziness during positional changes as well.  Feel she will benefit from acute care OT as well as follow-up SNF rehab to progress to a min assist or better level for discharge home with her family and 24 hour supervision.  May benefit from vestibular evaluation for better treatment of positional changing dizziness.  MD please order if you agree.     Follow Up Recommendations  SNF;Supervision/Assistance - 24 hour    Equipment Recommendations  None recommended by OT       Precautions / Restrictions Precautions Precautions: Fall Restrictions Weight Bearing Restrictions: No      Mobility Bed Mobility Overal bed mobility: Needs Assistance Bed Mobility: Supine to Sit     Supine to sit: Max assist        Transfers Overall transfer level: Needs assistance   Transfers: Sit to/from Stand Sit to Stand: Mod assist;From elevated surface         General transfer comment: Pt needs mod demonstrational cueing for hand placement with sit to stand as she wants to wrap her hands around the therapist's neck.     Balance Overall balance assessment: Needs assistance   Sitting balance-Leahy Scale: Fair     Standing balance support: Bilateral upper extremity supported Standing balance-Leahy Scale: Zero                              ADL Overall ADL's :  Needs assistance/impaired Eating/Feeding: Moderate assistance;Sitting Eating/Feeding Details (indicate cue type and reason): secondary to visual deficits Grooming: Wash/dry hands;Wash/dry face;Supervision/safety   Upper Body Bathing: Supervision/ safety;Sitting   Lower Body Bathing: Maximal assistance;Sit to/from stand   Upper Body Dressing : Moderate assistance;Sitting;Standing   Lower Body Dressing: Total assistance;Sit to/from stand   Toilet Transfer: Moderate assistance;Stand-pivot Toilet Transfer Details (indicate cue type and reason): simulated stand pivot from EOB to bedside chair Toileting- Clothing Manipulation and Hygiene: Moderate assistance;Sit to/from stand         General ADL Comments: Pt's daughter reports that pt needed assistance with all LB selfcare as well as toileting and toilet transfers.  She has only been performing stand pivot transfers the past couple of months with no mobility.  She reports ongoing dizziness with positional changes including rolling, supine to sit and sit to stand.  BP taken at 144/73.       Vision Vision Assessment?: No apparent visual deficits   Perception Perception Perception Tested?: No   Praxis Praxis Praxis tested?: Not tested    Pertinent Vitals/Pain Pain Assessment: No/denies pain     Hand Dominance Right   Extremity/Trunk Assessment Upper Extremity Assessment Upper Extremity Assessment: Overall WFL for tasks assessed   Lower Extremity Assessment Lower Extremity Assessment: Defer to PT evaluation   Cervical / Trunk Assessment Cervical / Trunk Assessment: Normal   Communication Communication Communication: [facility]  Cognition Arousal/Alertness: Awake/alert Behavior During Therapy: WFL for tasks assessed/performed Overall Cognitive Status: Within Functional Limits for tasks assessed       Memory: Decreased short-term memory (Pt able to recall 2/3 words after 15 minute delay)                        Home  Living Family/patient expects to be discharged to:: Private residence Living Arrangements: Children;Other relatives Available Help at Discharge: Family;Available 24 hours/day Type of Home: House Home Access: Ramped entrance     Home Layout: One level     Bathroom Shower/Tub: Other (comment) (pt sponge bathed)   Bathroom Toilet: Standard     Home Equipment: Walker - 2 wheels;Bedside commode;Wheelchair - manual;Hospital bed   Additional Comments: has used wheelchair most of the time over the last 3 months, before that she was using a RW      Prior Functioning/Environment Level of Independence: Needs assistance  Gait / Transfers Assistance Needed: pt is typically min-mod assist to transfer to EOB, pivot to BSC and WC  ADL's / Homemaking Assistance Needed: wears adult briefs with total assist for pericare and max assist for bathing and dressing   Comments: Family assists her with most all ADLs and mobility at this time.    OT Diagnosis: Generalized weakness   OT Problem List: Decreased strength;Impaired balance (sitting and/or standing);Decreased knowledge of use of DME or AE   OT Treatment/Interventions: Self-care/ADL training;Balance training;Patient/family education;Therapeutic activities;DME and/or AE instruction    OT Goals(Current goals can be found in the care plan section) Acute Rehab OT Goals Patient Stated Goal: Pt wants to get her strength back. OT Goal Formulation: With patient/family Time For Goal Achievement: 09/03/14 Potential to Achieve Goals: Good  OT Frequency: Min 2X/week              End of Session Nurse Communication: Mobility status  Activity Tolerance: Patient tolerated treatment well Patient left: in chair;with family/visitor present   Time: 1340-1417 OT Time Calculation (min): 37 min Charges:  OT General Charges $OT Visit: 1 Procedure OT Evaluation $Initial OT Evaluation Tier I: 1 Procedure OT Treatments $Self Care/Home Management : 23-37  mins  Mannie Ohlin OTR/L 08/20/2014, 2:32 PM

## 2014-08-20 NOTE — Progress Notes (Signed)
Patient and family stated that she takes 2.5mg  of Ambien at bedtime and not 5mg  as ordered. Dr.Tat made aware and order to change dose to 2.5mg .

## 2014-08-20 NOTE — Progress Notes (Signed)
ANTICOAGULATION CONSULT NOTE - Follow Up Consult  Pharmacy Consult for Coumadin Indication: Afib  Allergies  Allergen Reactions  . Darvon     Unknown reaction   . Digoxin And Related Nausea Only  . Penicillins Other (See Comments)    Was told had allergy from childhood... Unknown reaction  . Percocet [Oxycodone-Acetaminophen] Other (See Comments)    confused  . Percodan [Oxycodone-Aspirin]   . Vicodin [Hydrocodone-Acetaminophen] Other (See Comments)    confused    Patient Measurements: Height: 5\' 4"  (162.6 cm) Weight: 189 lb (85.73 kg) IBW/kg (Calculated) : 54.7  Vital Signs: Temp: 98.5 F (36.9 C) (08/04 0536) Temp Source: Oral (08/04 0536) BP: 145/72 mmHg (08/04 0536) Pulse Rate: 76 (08/04 0536)  Labs:  Recent Labs  08/18/14 0240 08/19/14 0250 08/20/14 0553  HGB 11.0* 10.8* 11.7*  HCT 34.0* 33.3* 35.7*  PLT 142* 143* 173  LABPROT 31.4* 26.1* 20.5*  INR 3.11* 2.43* 1.76*  CREATININE 1.71* 1.31* 1.08*    Estimated Creatinine Clearance: 35.2 mL/min (by C-G formula based on Cr of 1.08).   Assessment: 81 yo female on chronic Coumadin for hx afib. Home dose 3.25 mg daily except 2.5 mg on Wed and Sun. INR on admit was therapeutic at 2.91. INR = 1.76  Goal of Therapy:  INR 2-3 Monitor platelets by anticoagulation protocol: Yes   Plan:  Give coumadin 3.25mg  PO x 1 tonight Monitor daily INR, CBC, s/s of bleed  Thank you. Anette Guarneri, PharmD [phone]  08/20/2014,11:19 AM

## 2014-08-21 DIAGNOSIS — I5042 Chronic combined systolic (congestive) and diastolic (congestive) heart failure: Secondary | ICD-10-CM

## 2014-08-21 LAB — CULTURE, BLOOD (ROUTINE X 2)
Culture: NO GROWTH
Culture: NO GROWTH

## 2014-08-21 LAB — GLUCOSE, CAPILLARY
GLUCOSE-CAPILLARY: 111 mg/dL — AB (ref 65–99)
Glucose-Capillary: 91 mg/dL (ref 65–99)
Glucose-Capillary: 112 mg/dL — ABNORMAL HIGH (ref 65–99)
Glucose-Capillary: 132 mg/dL — ABNORMAL HIGH (ref 65–99)

## 2014-08-21 LAB — PROTIME-INR
INR: 1.76 — ABNORMAL HIGH (ref 0.00–1.49)
Prothrombin Time: 20.5 seconds — ABNORMAL HIGH (ref 11.6–15.2)

## 2014-08-21 LAB — TROPONIN I
TROPONIN I: 0.41 ng/mL — AB (ref <0.031)
TROPONIN I: 0.32 ng/mL — AB (ref <0.031)
TROPONIN I: 0.36 ng/mL — AB (ref <0.031)

## 2014-08-21 MED ORDER — SODIUM CHLORIDE 0.9 % IJ SOLN
10.0000 mL | INTRAMUSCULAR | Status: DC | PRN
  Administered 2014-08-22 (×2): 10 mL
  Filled 2014-08-21 (×2): qty 40

## 2014-08-21 MED ORDER — ASPIRIN 81 MG PO CHEW
243.0000 mg | CHEWABLE_TABLET | ORAL | Status: AC
  Administered 2014-08-21: 243 mg via ORAL
  Filled 2014-08-21: qty 3

## 2014-08-21 MED ORDER — WARFARIN SODIUM 5 MG PO TABS
5.0000 mg | ORAL_TABLET | Freq: Once | ORAL | Status: AC
  Administered 2014-08-21: 5 mg via ORAL
  Filled 2014-08-21: qty 1

## 2014-08-21 NOTE — Progress Notes (Signed)
Peripherally Inserted Central Catheter/Midline Placement  The IV Nurse has discussed with the patient and/or persons authorized to consent for the patient, the purpose of this procedure and the potential benefits and risks involved with this procedure.  The benefits include less needle sticks, lab draws from the catheter and patient may be discharged home with the catheter.  Risks include, but not limited to, infection, bleeding, blood clot (thrombus formation), and puncture of an artery; nerve damage and irregular heat beat.  Alternatives to this procedure were also discussed.  PICC/Midline Placement Documentation        [name] 08/21/2014, 8:57 AM

## 2014-08-21 NOTE — Care Management Important Message (Signed)
Important Message  Patient Details  Name: KYNNEDY CARRENO MRN: [id number] Date of Birth: 10/05/1920   Medicare Important Message Given:  Yes-third notification given    Zenon Mayo, RN 08/21/2014, 10:06 AMImportant Message  Patient Details  Name: GRACYNN RAJEWSKI MRN: [id number] Date of Birth: 08/08/1920   Medicare Important Message Given:  Yes-third notification given    Zenon Mayo, RN 08/21/2014, 10:06 AM

## 2014-08-21 NOTE — Progress Notes (Signed)
6:00, Pt c/o of chest pain 7/10, radiating to left shoulder. State " it feels like pressure. Made oncall NP, aware of pt chest pain, EKG completed, VSS. Will continue to monitor and await NP orders.

## 2014-08-21 NOTE — Progress Notes (Signed)
PROGRESS NOTE  [name] GYF:[id number] DOB: Feb 04, 1920 DOA: 08/16/2014 PCP: Kevan Ny, MD   Admit HPI / Brief Narrative: 81 yo female with hx of recurrent UTIs who presented to [facility] ED for evaluation of several days duration of fevers and BP as low 90/60 with intermittent confusion and lethargy. Family stated she had some dysuria and malodorous urine typical for her UTI's.  In ED the pt was easy to arouse but dozed off very quickly, VSS, UA concerning for UTI and with SBP in 80's.  HPI/Subjective: The patient is resting comfortably in bed. She states she feels better overall. She has no new complaints. She denies chest pain shortness of breath nausea vomiting or abdominal pain. She still has some intermittent dysuria.  Assessment/Plan:  Sepsis due to multi drug resistant E coli (ESBL) pyelonephritis  Continue focused IV antibiotic therapy for this organism that is resistant to most commonly available antibiotics -Continue imipenem--plan 14 days total  Acute on chronic renal failure stage 3 Renal function continues to improve with volume resuscitation - follow trend  -Renal function back to baseline  -Baseline creatinine 1.0-1.3   Atypical chest pain with elevated troponin -Due to demand ischemia in the setting of sepsis and acute on chronic renal failure as well as cardiomyopathy with EF 30-35% -Troponin trend is flat -EKG without concerning ischemic change -Continue aspirin and [name]  Transaminitis  Most consistent with low-grade shock liver - imaging of liver parenchyma unrevealing - improving with hemodynamic support   Chronic systolic and diastolic CHF EF this admit confirmed to be 30-35% w/ multiple areas of hypokinesis and grade 1 DD - exercise caution with volume resuscitation  Chronic Afib CHADS 2 vasc score 7 - continue chronic anticoagulation with warfarin  Hypothyroidism  DM2 w/ renal complications CBG well controlled at this time -  follow trend -05/25/2014 hemoglobin A1c 7.3   Obesity - Body mass index is 31.17 kg/(m^2).  Code Status: FULL Family Communication: Spoke with daughter at bedside Disposition Plan: SNF in 24 hours if stable  Consultants: none  Procedures: TTE - 8/3   Antibiotics: Aztreonam 7/31  Levaquin 7/31 Imipenem 8/2 >   DVT prophylaxis: warfarin   Procedures/Studies: US Abdomen Complete  08/17/2014   CLINICAL DATA:  Transaminitis.  Diabetes.  EXAM: ULTRASOUND ABDOMEN COMPLETE  COMPARISON:  Ultrasound of the abdomen 02/05/2014; CT of the abdomen 02/04/2014  FINDINGS: Gallbladder: Gallbladder is distended. Gallbladder sludge is present. Gallbladder wall is upper limits normal at 2.4 mm. No pericholecystic fluid or sonographic Murphy's sign.  Common bile duct: Diameter: 4.1 mm  Liver: No focal lesion identified. Within normal limits in parenchymal echogenicity.  IVC: No abnormality visualized.  Pancreas: Visualized portion unremarkable.  Spleen: Size and appearance within normal limits.  Right Kidney: Length: 10.7 cm. Echogenicity is normal. No mass or hydronephrosis. Mild renal parenchymal thinning.  Left Kidney: Length: 11.2 cm. Echogenicity within normal limits. No mass or hydronephrosis visualized. Mild renal parenchymal thinning.  Abdominal aorta: Visualized portion not aneurysmal, 2.4 cm.  Other findings: None.  IMPRESSION: 1. No evidence for acute cholecystitis. However the gallbladder is distended and sludge is present, consistent with biliary stasis. 2. Mild renal parenchymal thinning without evidence for acute renal abnormality.   Electronically Signed   By: Nolon Nations M.D.   On: 08/17/2014 09:35   Dg Chest Port 1 View  08/16/2014   CLINICAL DATA:  Sepsis, hypertension, diabetes.  EXAM: PORTABLE CHEST - 1 VIEW  COMPARISON:  05/24/2014  FINDINGS: Sternotomy wires and left-sided pacemaker unchanged. Lungs are hypoinflated without focal consolidation or effusion. There subtle prominence  of the perihilar markings which may be due to mild degree of vascular congestion. There is borderline stable cardiomegaly. Remainder of the exam is unchanged.  IMPRESSION: Hypoinflation with borderline stable cardiomegaly and possible mild vascular congestion.   Electronically Signed   By: Marin Olp M.D.   On: 08/16/2014 15:27         Subjective: Patient denies fevers, chills, headache, chest pain, dyspnea, nausea, vomiting, diarrhea, abdominal pain, dysuria, hematuria   Objective: Filed Vitals:   08/21/14 0504 08/21/14 0620 08/21/14 1125 08/21/14 1424  BP: 106/52 129/67 124/59 146/90  Pulse: 70 70 71 75  Temp: 98.7 F (37.1 C) 98 F (36.7 C)  98.4 F (36.9 C)  TempSrc: Oral Oral  Oral  Resp: 18 18  20   Height:      Weight: 87.9 kg (193 lb 12.6 oz)     SpO2: 97% 100% 97% 100%    Intake/Output Summary (Last 24 hours) at 08/21/14 1717 Last data filed at 08/21/14 1430  Gross per 24 hour  Intake 1615.84 ml  Output      0 ml  Net 1615.84 ml   Weight change: 2.17 kg (4 lb 12.6 oz) Exam:   General:  Pt is alert, follows commands appropriately, not in acute distress  HEENT: No icterus, No thrush, [place]/AT  Cardiovascular: RRR, S1/S2, no rubs, no gallops  Respiratory: CTA bilaterally, no wheezing, no crackles, no rhonchi  Abdomen: Soft/+BS, non tender, non distended, no guarding  Extremities: trace LE edema, No lymphangitis, No petechiae, No rashes, no synovitis  Data Reviewed: Basic Metabolic Panel:  Recent Labs Lab 08/16/14 1419 08/17/14 0238 08/18/14 0240 08/19/14 0250 08/20/14 0553  NA 133* 134* 135 136 136  K 4.8 4.0 4.3 4.2 4.4  CL 96* 101 102 107 107  CO2 26 26 26 25 23   GLUCOSE 286* 125* 114* 107* 127*  BUN 26* 21* 22* 16 11  CREATININE 1.96* 1.60* 1.71* 1.31* 1.08*  CALCIUM 9.2 8.6* 8.7* 8.5* 8.7*  MG  --   --   --  2.0  --    Liver Function Tests:  Recent Labs Lab 08/16/14 1419 08/17/14 0238 08/18/14 0240 08/19/14 0250 08/20/14 0553    AST 594* 264* 142* 79* 48*  ALT 344* 213* 139* 94* 66*  ALKPHOS 194* 149* 125 109 104  BILITOT 1.7* 0.9 0.8 0.7 0.6  PROT 7.1 6.0* 5.5* 5.4* 5.9*  ALBUMIN 3.5 2.7* 2.5* 2.5* 2.6*   No results for input(s): LIPASE, AMYLASE in the last 168 hours. No results for input(s): AMMONIA in the last 168 hours. CBC:  Recent Labs Lab 08/16/14 1419 08/17/14 0238 08/18/14 0240 08/19/14 0250 08/20/14 0553  WBC 13.8* 9.5 4.8 4.2 5.0  NEUTROABS 12.7*  --   --  1.9  --   HGB 13.7 11.8* 11.0* 10.8* 11.7*  HCT 41.0 35.4* 34.0* 33.3* 35.7*  MCV 87.0 87.4 88.5 87.6 87.7  PLT 187 143* 142* 143* 173   Cardiac Enzymes:  Recent Labs Lab 08/21/14 0754 08/21/14 1200  TROPONINI 0.41* 0.32*   BNP: Invalid input(s): POCBNP CBG:  Recent Labs Lab 08/20/14 1148 08/20/14 1701 08/20/14 2140 08/21/14 0751 08/21/14 1202  GLUCAP 107* 123* 121* 91 112*    Recent Results (from the past 240 hour(s))  Culture, blood (routine x 2)     Status: None   Collection Time: 08/16/14  2:19 PM  Result Value Ref Range Status   Specimen Description BLOOD LEFT ANTECUBITAL  Final   Special Requests BOTTLES DRAWN AEROBIC ONLY 2CC  Final   Culture NO GROWTH 5 DAYS  Final   Report Status 08/21/2014 FINAL  Final  Urine culture     Status: None   Collection Time: 08/16/14  3:21 PM  Result Value Ref Range Status   Specimen Description URINE, CATHETERIZED  Final   Special Requests NONE  Final   Culture   Final    >=100,000 COLONIES/mL ESCHERICHIA COLI Confirmed Extended Spectrum Beta-Lactamase Producer (ESBL)    Report Status 08/18/2014 FINAL  Final   Organism ID, Bacteria ESCHERICHIA COLI  Final      Susceptibility   Escherichia coli - MIC*    AMPICILLIN >=32 RESISTANT Resistant     CEFAZOLIN >=64 RESISTANT Resistant     CEFTRIAXONE 8 RESISTANT Resistant     CIPROFLOXACIN >=4 RESISTANT Resistant     GENTAMICIN <=1 SENSITIVE Sensitive     IMIPENEM <=0.25 SENSITIVE Sensitive     NITROFURANTOIN 32 SENSITIVE  Sensitive     TRIMETH/SULFA >=320 RESISTANT Resistant     AMPICILLIN/SULBACTAM 4 SENSITIVE Sensitive     PIP/TAZO <=4 SENSITIVE Sensitive     * >=100,000 COLONIES/mL ESCHERICHIA COLI  Culture, blood (routine x 2)     Status: None   Collection Time: 08/16/14  3:40 PM  Result Value Ref Range Status   Specimen Description BLOOD RIGHT HAND  Final   Special Requests BOTTLES DRAWN AEROBIC ONLY 3CC  Final   Culture NO GROWTH 5 DAYS  Final   Report Status 08/21/2014 FINAL  Final     Scheduled Meds: . aspirin  81 mg Oral Daily  . atropine  1 drop Right Eye QID  . donepezil  5 mg Oral QHS  . gabapentin  100 mg Oral BID  . imipenem-cilastatin  250 mg Intravenous 4 times per day  . insulin aspart  0-9 Units Subcutaneous TID WC  . levothyroxine  50 mcg Oral QAC breakfast  . nitroGLYCERIN  0.2 mg Transdermal Daily  . pantoprazole  40 mg Oral Daily  . potassium chloride  10 mEq Oral BID  . ranolazine  500 mg Oral BID  . warfarin  5 mg Oral ONCE-1800  . Warfarin - Pharmacist Dosing Inpatient   Does not apply q1800  . zolpidem  2.5 mg Oral QHS   Continuous Infusions: . sodium chloride 50 mL/hr at 08/20/14 1731     Ernestyne Caldwell, DO  [facility] Pager [phone]  If 7PM-7AM, please contact night-coverage [email or web address] Password [email or web address] 08/21/2014, 5:17 PM   LOS: 5 days

## 2014-08-21 NOTE — Care Management Note (Addendum)
Case Management Note  Patient Details  Name: [name] MRN: [id number] Date of Birth: 06/18/1920  Subjective/Objective:    Per previous NCM note patient was planning to go home with [facility] services, but per ot eval rec snf, but pt has not seen patient yet. Patient is transfer from SDU, NCM will cont to follow for dc needs.                Action/Plan:   Expected Discharge Date:                  Expected Discharge Plan:  [place]  In-House Referral:     Discharge planning Services  CM Consult  Post Acute Care Choice:    Choice offered to:     DME Arranged:    DME Agency:     HH Arranged:    [place] Agency:     Status of Service:  In process, will continue to follow  Medicare Important Message Given:  Yes-second notification given Date Medicare IM Given:    Medicare IM give by:    Date Additional Medicare IM Given:    Additional Medicare Important Message give by:     If discussed at [place] of Stay Meetings, dates discussed:    Additional Comments:  Zenon Mayo, RN 08/21/2014, 9:49 AM

## 2014-08-21 NOTE — Progress Notes (Signed)
ANTICOAGULATION CONSULT NOTE - Follow Up Consult  Pharmacy Consult for Coumadin Indication: Afib  Allergies  Allergen Reactions  . Darvon     Unknown reaction   . Digoxin And Related Nausea Only  . Penicillins Other (See Comments)    Was told had allergy from childhood... Unknown reaction  . Percocet [Oxycodone-Acetaminophen] Other (See Comments)    confused  . Percodan [Oxycodone-Aspirin]   . Vicodin [Hydrocodone-Acetaminophen] Other (See Comments)    confused    Patient Measurements: Height: 5\' 4"  (162.6 cm) Weight: 193 lb 12.6 oz (87.9 kg) IBW/kg (Calculated) : 54.7  Vital Signs: Temp: 98 F (36.7 C) (08/05 0620) Temp Source: Oral (08/05 0620) BP: 129/67 mmHg (08/05 0620) Pulse Rate: 70 (08/05 0620)  Labs:  Recent Labs  08/19/14 0250 08/20/14 0553 08/21/14 0532 08/21/14 0754  HGB 10.8* 11.7*  --   --   HCT 33.3* 35.7*  --   --   PLT 143* 173  --   --   LABPROT 26.1* 20.5* 20.5*  --   INR 2.43* 1.76* 1.76*  --   CREATININE 1.31* 1.08*  --   --   TROPONINI  --   --   --  0.41*    Estimated Creatinine Clearance: 35.7 mL/min (by C-G formula based on Cr of 1.08).   Assessment: 81 yo female on chronic Coumadin for hx afib. Home dose 3.25 mg daily except 2.5 mg on Wed and Sun. INR on admit was therapeutic at 2.91. INR still = 1.76  Goal of Therapy:  INR 2-3 Monitor platelets by anticoagulation protocol: Yes   Plan:  Give coumadin 5mg  PO x 1 tonight Monitor daily INR, CBC, s/s of bleed  Thank you. Anette Guarneri, PharmD [phone]  08/21/2014,10:11 AM

## 2014-08-21 NOTE — Clinical Social Work Note (Signed)
Clinical Social Worker met with patient and patient son at bedside to offer support and discuss patient plans at discharge.  Patient son states that patient spouse has been to [facility] in the past and they are requesting placement there.  CSW completed FL2 and initiated SNF search.  [facility] extended the bed offer and son notified.  Family to go complete paperwork at 4:30pm today and facility agreeable with patient discharge on Saturday.  CSW to complete full assessment at a later time.  CSW remains available for support and to facilitate patient discharge needs once medically stable.  Barbette Or, [place]

## 2014-08-21 NOTE — Evaluation (Signed)
Physical Therapy Evaluation Patient Details Name: [name] MRN: [id number] DOB: Jul 01, 1920 Today's Date: 08/21/2014   History of Present Illness  81 yo female with hx of recurrent UTIs who presented to [facility] ED for evaluation of several days duration of fevers and BP as low 90/60 with intermittent confusion and lethargy. Family stated she had some dysuria and malodorous urine typical for her UTI's.  Pt diagnosesed with Sepsis due to ESBL pyelonephritis   Clinical Impression  Pt admitted with above diagnosis and presents to PT with functional limitations due to deficits listed below (See PT problem list). Pt needs skilled PT to maximize independence and safety to allow discharge to SNF.      Follow Up Recommendations SNF    Equipment Recommendations  None recommended by PT    Recommendations for Other Services       Precautions / Restrictions Precautions Precautions: Fall Restrictions Weight Bearing Restrictions: No      Mobility  Bed Mobility Overal bed mobility: Needs Assistance Bed Mobility: Supine to Sit;Sit to Supine     Supine to sit: +2 for physical assistance;Max assist Sit to supine: +2 for physical assistance;Max assist   General bed mobility comments: Assist to bring legs off bed and to elevate trunk into sitting. Assist to lower trunk and bring legs back up into bed.  Transfers                    Ambulation/Gait                Stairs            Wheelchair Mobility    Modified Rankin (Stroke Patients Only)       Balance Overall balance assessment: Needs assistance Sitting-balance support: No upper extremity supported;Feet supported Sitting balance-Leahy Scale: Fair                                       Pertinent Vitals/Pain      Home Living Family/patient expects to be discharged to:: Private residence Living Arrangements: Children;Other relatives Available Help at Discharge: Family;Available 24  hours/day Type of Home: House Home Access: Ramped entrance     Home Layout: One level Home Equipment: Walker - 2 wheels;Bedside commode;Wheelchair - manual;Hospital bed Additional Comments: has used wheelchair most of the time over the last 3 months, before that she was using a RW    Prior Function Level of Independence: Needs assistance   Gait / Transfers Assistance Needed: pt is typically min-mod assist to transfer to EOB, pivot to BSC and WC   ADL's / Homemaking Assistance Needed: wears adult briefs with total assist for pericare and max assist for bathing and dressing  Comments: Family assists her with most all ADLs and mobility at this time.     Hand Dominance   Dominant Hand: Right    Extremity/Trunk Assessment   Upper Extremity Assessment: Defer to OT evaluation           Lower Extremity Assessment: Generalized weakness      Cervical / Trunk Assessment: Normal  Communication   Communication: HOH  Cognition Arousal/Alertness: Awake/alert Behavior During Therapy: WFL for tasks assessed/performed Overall Cognitive Status: Within Functional Limits for tasks assessed       Memory: Decreased short-term memory (Pt able to recall 2/3 words after 15 minute delay)  General Comments      Exercises        Assessment/Plan    PT Assessment Patient needs continued PT services  PT Diagnosis Generalized weakness;Difficulty walking   PT Problem List Decreased strength;Decreased activity tolerance;Decreased balance;Decreased mobility;Obesity  PT Treatment Interventions DME instruction;Functional mobility training;Therapeutic activities;Therapeutic exercise;Balance training;Patient/family education   PT Goals (Current goals can be found in the Care Plan section) Acute Rehab PT Goals Patient Stated Goal: Pt wants to get her strength back. PT Goal Formulation: With patient Time For Goal Achievement: 09/04/14 Potential to Achieve Goals: Good     Frequency Min 2X/week   Barriers to discharge        Co-evaluation               End of Session   Activity Tolerance: Patient limited by fatigue Patient left: in bed;with call bell/phone within reach;with nursing/sitter in room           Time: 4695-0722 PT Time Calculation (min) (ACUTE ONLY): 19 min   Charges:   PT Evaluation $Initial PT Evaluation Tier I: 1 Procedure     PT G Codes:        Riko Lumsden 13-Sep-2014, 4:04 PM  [facility] PT [phone]

## 2014-08-22 LAB — GLUCOSE, CAPILLARY
GLUCOSE-CAPILLARY: 108 mg/dL — AB (ref 65–99)
Glucose-Capillary: 122 mg/dL — ABNORMAL HIGH (ref 65–99)

## 2014-08-22 LAB — BASIC METABOLIC PANEL
Anion gap: 4 — ABNORMAL LOW (ref 5–15)
BUN: 9 mg/dL (ref 6–20)
CALCIUM: 8.6 mg/dL — AB (ref 8.9–10.3)
CHLORIDE: 108 mmol/L (ref 101–111)
CO2: 24 mmol/L (ref 22–32)
CREATININE: 1 mg/dL (ref 0.44–1.00)
GFR calc Af Amer: 55 mL/min — ABNORMAL LOW (ref >60)
GFR, EST NON AFRICAN AMERICAN: 47 mL/min — AB (ref >60)
GLUCOSE: 106 mg/dL — AB (ref 65–99)
Potassium: 4.5 mmol/L (ref 3.5–5.1)
Sodium: 136 mmol/L (ref 135–145)

## 2014-08-22 LAB — PROTIME-INR
INR: 1.97 — AB (ref 0.00–1.49)
PROTHROMBIN TIME: 22.3 s — AB (ref 11.6–15.2)

## 2014-08-22 MED ORDER — METOPROLOL TARTRATE 25 MG PO TABS: 12.5000 mg | ORAL_TABLET | Freq: Two times a day (BID) | ORAL | Status: DC

## 2014-08-22 MED ORDER — HEPARIN SOD (PORK) LOCK FLUSH 100 UNIT/ML IV SOLN
250.0000 [IU] | INTRAVENOUS | Status: AC | PRN
Start: 2014-08-22 — End: 2014-08-22
  Administered 2014-08-22: 250 [IU]

## 2014-08-22 MED ORDER — IMIPENEM-CILASTATIN 250 MG IV SOLR: 250.0000 mg | Freq: Four times a day (QID) | INTRAVENOUS | Status: DC

## 2014-08-22 MED ORDER — TRAMADOL HCL 50 MG PO TABS: 50.0000 mg | ORAL_TABLET | Freq: Four times a day (QID) | ORAL | Status: DC | PRN

## 2014-08-22 MED ORDER — WARFARIN SODIUM 5 MG PO TABS
5.0000 mg | ORAL_TABLET | Freq: Once | ORAL | Status: AC
  Administered 2014-08-22: 5 mg via ORAL
  Filled 2014-08-22: qty 1

## 2014-08-22 MED ORDER — ZOLPIDEM TARTRATE 5 MG PO TABS: 2.5000 mg | ORAL_TABLET | Freq: Every day | ORAL | Status: DC

## 2014-08-22 NOTE — Progress Notes (Signed)
Nsg Discharge Note  Admit Date:  08/16/2014 Discharge date: 08/22/2014   [name] to be D/C'd Nursing Home per MD order.  AVS completed.  Copy for chart, and copy for patient signed, and dated. Patient/caregiver able to verbalize understanding.  Discharge Medication:   Medication List    STOP taking these medications        Insulin Glargine 100 UNIT/ML Solostar Pen  Commonly known as:  LANTUS SOLOSTAR     tobramycin 0.3 % ophthalmic solution  Commonly known as:  TOBREX      TAKE these medications        anastrozole 1 MG tablet  Commonly known as:  ARIMIDEX  Take 1 tablet (1 mg total) by mouth daily.     aspirin 81 MG chewable tablet  Chew 81 mg by mouth daily.     atropine 1 % ophthalmic solution  Place 1 drop into the right eye 4 (four) times daily.     donepezil 5 MG tablet  Commonly known as:  ARICEPT  Take 5 mg by mouth at bedtime.     feeding supplement (GLUCERNA SHAKE) Liqd  Take 237 mLs by mouth daily.     folic acid 1 MG tablet  Commonly known as:  FOLVITE  Take 1 tablet (1 mg total) by mouth daily.     furosemide 40 MG tablet  Commonly known as:  LASIX  Take 1 tablet (40 mg total) by mouth 2 (two) times daily.     gabapentin 100 MG capsule  Commonly known as:  NEURONTIN  Take 100 mg by mouth 2 (two) times daily.     glipiZIDE 5 MG tablet  Commonly known as:  GLUCOTROL  Take one-half tablet by  mouth daily only if blood  sugar is over 200     imipenem-cilastatin 250 mg in sodium chloride 0.9 % 100 mL  Inject 250 mg into the vein every 6 (six) hours. Stop after last dose on 08/31/14     levothyroxine 50 MCG tablet  Commonly known as:  SYNTHROID, LEVOTHROID  Take 1 tablet (50 mcg total) by mouth daily before breakfast.     loratadine 10 MG tablet  Commonly known as:  CLARITIN  Take 10 mg by mouth daily.     meclizine 25 MG tablet  Commonly known as:  ANTIVERT  Take 25 mg by mouth 3 (three) times daily as needed for dizziness.     metoprolol tartrate 25 MG tablet  Commonly known as:  LOPRESSOR  Take 0.5 tablets (12.5 mg total) by mouth 2 (two) times daily.     nitroGLYCERIN 0.2 mg/hr patch  Commonly known as:  NITRODUR - Dosed in mg/24 hr  Place 1 patch (0.2 mg total) onto the skin daily.     omeprazole 20 MG capsule  Commonly known as:  PRILOSEC  Take 1 capsule by mouth  daily     potassium chloride 10 MEQ tablet  Commonly known as:  K-DUR,KLOR-CON  Take 1 tablet (10 mEq total) by mouth 2 (two) times daily.     ranolazine 500 MG 12 hr tablet  Commonly known as:  [name]  Take 1 tablet (500 mg total) by mouth 2 (two) times daily.     REFRESH OP  Place 1 drop into both eyes daily as needed (dry eyes).     rosuvastatin 10 MG tablet  Commonly known as:  CRESTOR  Take 1 tablet (10 mg total) by mouth 3 (three) times a week. ZOX:[id number] Exp:  8-18     traMADol 50 MG tablet  Commonly known as:  ULTRAM  Take 1 tablet (50 mg total) by mouth every 6 (six) hours as needed for moderate pain.     Vitamin D (Ergocalciferol) 50000 UNITS Caps capsule  Commonly known as:  DRISDOL  Take 1,000 Units by mouth daily.     warfarin 2.5 MG tablet  Commonly known as:  COUMADIN  Take 1 to 1.5 tablets by mouth daily as directed by coumadin clinic     zolpidem 5 MG tablet  Commonly known as:  AMBIEN  Take 0.5 tablets (2.5 mg total) by mouth at bedtime. sleep        Discharge Assessment: Filed Vitals:   08/21/14 2147  BP: 106/55  Pulse: 76  Temp: 99 F (37.2 C)  Resp: 20   Skin clean, dry and intact without evidence of skin break down, no evidence of skin tears noted. IV catheter discontinued intact. Site without signs and symptoms of complications - no redness or edema noted at insertion site, patient denies c/o pain - only slight tenderness at site.  Dressing with slight pressure applied.  D/c Instructions-Education: Discharge instructions given to patient/family with verbalized understanding. D/c education  completed with patient/family including follow up instructions, medication list, d/c activities limitations if indicated, with other d/c instructions as indicated by MD - patient able to verbalize understanding, all questions fully answered. Report called into [facility].  Patient instructed to return to ED, call 911, or call MD for any changes in condition.  Patient escorted via EMS to SNF.   Dayle Points, RN 08/22/2014 1:29 PM

## 2014-08-22 NOTE — Progress Notes (Signed)
ANTICOAGULATION CONSULT NOTE - Follow Up Consult  Pharmacy Consult for coumadin Indication: atrial fibrillation  Allergies  Allergen Reactions  . Darvon     Unknown reaction   . Digoxin And Related Nausea Only  . Penicillins Other (See Comments)    Was told had allergy from childhood... Unknown reaction  . Percocet [Oxycodone-Acetaminophen] Other (See Comments)    confused  . Percodan [Oxycodone-Aspirin]   . Vicodin [Hydrocodone-Acetaminophen] Other (See Comments)    confused    Patient Measurements: Height: 5\' 4"  (162.6 cm) Weight: 192 lb 14.4 oz (87.5 kg) IBW/kg (Calculated) : 54.7  Vital Signs: Temp: 99 F (37.2 C) (08/05 2147) Temp Source: Oral (08/05 2147) BP: 106/55 mmHg (08/05 2147) Pulse Rate: 76 (08/05 2147)  Labs:  Recent Labs  08/20/14 0553 08/21/14 0532 08/21/14 0754 08/21/14 1200 08/21/14 1917 08/22/14 0505  HGB 11.7*  --   --   --   --   --   HCT 35.7*  --   --   --   --   --   PLT 173  --   --   --   --   --   LABPROT 20.5* 20.5*  --   --   --  22.3*  INR 1.76* 1.76*  --   --   --  1.97*  CREATININE 1.08*  --   --   --   --  1.00  TROPONINI  --   --  0.41* 0.32* 0.36*  --     Estimated Creatinine Clearance: 38.4 mL/min (by C-G formula based on Cr of 1).   Assessment: 81 yo female on chronic Coumadin for hx afib. Home dose 3.25 mg daily except 2.5 mg on Wed and Sun. INR on admit was therapeutic at 2.91. INR 1.97, no new CBC, no bleeding noted.  Goal of Therapy:  INR 2-3 Monitor platelets by anticoagulation protocol: Yes   Plan:  - Pt to be d/c today, will give warfarin 5mg  x1 now - Continue on home dose upon d/c - Monitor daily INR, CBC, s/sx of bleeding  Dimitri Ped, PharmD. Clinical Pharmacist Resident Pager: [phone]

## 2014-08-22 NOTE — Discharge Summary (Addendum)
Physician Discharge Summary  [name] QPY:[id number] DOB: 08/07/1920 DOA: 08/16/2014  PCP: Kevan Ny, MD  Admit date: 08/16/2014 Discharge date: 08/22/2014  Recommendations for Outpatient Follow-up:  1. Pt will need to follow up with PCP in 2 weeks post discharge 2. Please obtain BMP and CBC in 1 week 3. Please check INR on 08/24/2014 and adjust Coumadin dosing accordingly for INR 2-3 4. Please remove PICC line after last dose of antibiotics on 08/31/2014 5. Continue imipenem 250 mg IV every 6 hours through the last day of dosing on 08/31/2014  Discharge Diagnoses:  Sepsis due to multi drug resistant E coli (ESBL) pyelonephritis  Continue focused IV antibiotic therapy for this organism that is resistant to most commonly available antibiotics -Continue imipenem--plan 14 days total--lasted day of antibiotics 08/31/2014 -Remove PICC line after last dose of antibiotics  Acute on chronic renal failure stage 3 Renal function continues to improve with volume resuscitation - follow trend  -Renal function back to baseline --serum creatinine 1.00 on the day of discharge -Baseline creatinine 1.0-1.3   Atypical chest pain with elevated troponin -Due to demand ischemia in the setting of sepsis and acute on chronic renal failure as well as cardiomyopathy with EF 30-35% -Troponin trend is flat -EKG without concerning ischemic change -Continue aspirin and [name]  Transaminitis  Most consistent with low-grade shock liver - imaging of liver parenchyma unrevealing - improving with hemodynamic support   Chronic systolic and diastolic CHF EF this admit confirmed to be 30-35% w/ multiple areas of hypokinesis and grade 1 DD - exercise caution with volume resuscitation -Resume home dose of furosemide 40 mg by mouth twice a day as well as daily supplementation with potassium  Chronic Afib CHADS 2 vasc score 7 - continue chronic anticoagulation with warfarin -Please check INR on 08/24/2014  and adjust Coumadin dosing accordingly for INR 2-3  -Resume metoprolol tartrate 12.5 mg twice a day  Hypothyroidism -Continue levothyroxine 50 g daily  DM2 w/ renal complications CBG well controlled at this time - follow trend -05/25/2014 hemoglobin A1c 7.3  -Discharge on previous home dose of glipizide Hyperlipidemia -Continue Crestor  Obesity - Body mass index is 31.17 kg/(m^2).  Code Status: FULL Family Communication: Spoke with daughter at bedside Disposition Plan: SNF in 24 hours if stable  Consultants: none  Procedures: TTE - 8/3   Antibiotics: Aztreonam 7/31  Levaquin 7/31 Imipenem 8/2 >   DVT prophylaxis: warfarin  Discharge Condition: stable  Disposition: SNF  Diet:carb modiified Wt Readings from Last 3 Encounters:  08/22/14 87.5 kg (192 lb 14.4 oz)  07/30/14 82.464 kg (181 lb 12.8 oz)  07/21/14 78.472 kg (173 lb)    History of present illness:  81 yo female with hx of recurrent UTIs who presented to [facility] ED for evaluation of several days duration of fevers and BP as low 90/60 with intermittent confusion and lethargy. Family stated she had some dysuria and malodorous urine typical for her UTI's.  In ED the pt was easy to arouse but dozed off very quickly, VSS, UA concerning for UTI and with SBP in 80's. The patient was started on intravenous antibiotics after cultures were obtained. She was fluid resuscitated. She did not require any basal pressors. She was initially admitted to the stepdown unit. Once susceptibility data returned, her antibiotics were adjusted. The patient was started on imipenem. Due to her multi-drug-resistant Escherichia coli, the patient will require antibiotics for 14 days. The patient will continue her antibiotics regimen through 08/31/2014. The patient continued  to improve clinically. Her blood pressure remained stable. Her renal function also improved back to baseline. The patient had an episode of chest discomfort. This was atypical.  Troponins were minimally elevated suggestive of demand ischemia in the setting of acute on chronic renal failure, sepsis, and systolic CHF.   Physical therapy recommended skilled nursing facility. The patient was discharged in stable condition.  Discharge Exam: Filed Vitals:   08/21/14 2147  BP: 106/55  Pulse: 76  Temp: 99 F (37.2 C)  Resp: 20   Filed Vitals:   08/21/14 1125 08/21/14 1424 08/21/14 2147 08/22/14 0500  BP: 124/59 146/90 106/55   Pulse: 71 75 76   Temp:  98.4 F (36.9 C) 99 F (37.2 C)   TempSrc:  Oral Oral   Resp:  20 20   Height:      Weight:    87.5 kg (192 lb 14.4 oz)  SpO2: 97% 100% 96%    General: A&O x 3, NAD, pleasant, cooperative Cardiovascular: RRR, no rub, no gallop, no S3 Respiratory: Bibasilar crackles. No wheezing. Abdomen:soft, nontender, nondistended, positive bowel sounds Extremities: 1+LE edema, No lymphangitis, no petechiae  Discharge Instructions      Discharge Instructions    Diet - low sodium heart healthy    Complete by:  As directed      Increase activity slowly    Complete by:  As directed             Medication List    STOP taking these medications        Insulin Glargine 100 UNIT/ML Solostar Pen  Commonly known as:  LANTUS SOLOSTAR     tobramycin 0.3 % ophthalmic solution  Commonly known as:  TOBREX      TAKE these medications        anastrozole 1 MG tablet  Commonly known as:  ARIMIDEX  Take 1 tablet (1 mg total) by mouth daily.     aspirin 81 MG chewable tablet  Chew 81 mg by mouth daily.     atropine 1 % ophthalmic solution  Place 1 drop into the right eye 4 (four) times daily.     donepezil 5 MG tablet  Commonly known as:  ARICEPT  Take 5 mg by mouth at bedtime.     feeding supplement (GLUCERNA SHAKE) Liqd  Take 237 mLs by mouth daily.     folic acid 1 MG tablet  Commonly known as:  FOLVITE  Take 1 tablet (1 mg total) by mouth daily.     furosemide 40 MG tablet  Commonly known as:  LASIX  Take  1 tablet (40 mg total) by mouth 2 (two) times daily.     gabapentin 100 MG capsule  Commonly known as:  NEURONTIN  Take 100 mg by mouth 2 (two) times daily.     glipiZIDE 5 MG tablet  Commonly known as:  GLUCOTROL  Take one-half tablet by  mouth daily only if blood  sugar is over 200     imipenem-cilastatin 250 mg in sodium chloride 0.9 % 100 mL  Inject 250 mg into the vein every 6 (six) hours. Stop after last dose on 08/31/14     levothyroxine 50 MCG tablet  Commonly known as:  SYNTHROID, LEVOTHROID  Take 1 tablet (50 mcg total) by mouth daily before breakfast.     loratadine 10 MG tablet  Commonly known as:  CLARITIN  Take 10 mg by mouth daily.     meclizine 25 MG tablet  Commonly known as:  ANTIVERT  Take 25 mg by mouth 3 (three) times daily as needed for dizziness.     metoprolol tartrate 25 MG tablet  Commonly known as:  LOPRESSOR  Take 0.5 tablets (12.5 mg total) by mouth 2 (two) times daily.     nitroGLYCERIN 0.2 mg/hr patch  Commonly known as:  NITRODUR - Dosed in mg/24 hr  Place 1 patch (0.2 mg total) onto the skin daily.     omeprazole 20 MG capsule  Commonly known as:  PRILOSEC  Take 1 capsule by mouth  daily     potassium chloride 10 MEQ tablet  Commonly known as:  K-DUR,KLOR-CON  Take 1 tablet (10 mEq total) by mouth 2 (two) times daily.     ranolazine 500 MG 12 hr tablet  Commonly known as:  [name]  Take 1 tablet (500 mg total) by mouth 2 (two) times daily.     REFRESH OP  Place 1 drop into both eyes daily as needed (dry eyes).     rosuvastatin 10 MG tablet  Commonly known as:  CRESTOR  Take 1 tablet (10 mg total) by mouth 3 (three) times a week. DEY:[id number] Exp: 8-18     traMADol 50 MG tablet  Commonly known as:  ULTRAM  Take 1 tablet (50 mg total) by mouth every 6 (six) hours as needed for moderate pain.     Vitamin D (Ergocalciferol) 50000 UNITS Caps capsule  Commonly known as:  DRISDOL  Take 1,000 Units by mouth daily.     warfarin 2.5 MG  tablet  Commonly known as:  COUMADIN  Take 1 to 1.5 tablets by mouth daily as directed by coumadin clinic     zolpidem 5 MG tablet  Commonly known as:  AMBIEN  Take 0.5 tablets (2.5 mg total) by mouth at bedtime. sleep         The results of significant diagnostics from this hospitalization (including imaging, microbiology, ancillary and laboratory) are listed below for reference.    Significant Diagnostic Studies: US Abdomen Complete  08/17/2014   CLINICAL DATA:  Transaminitis.  Diabetes.  EXAM: ULTRASOUND ABDOMEN COMPLETE  COMPARISON:  Ultrasound of the abdomen 02/05/2014; CT of the abdomen 02/04/2014  FINDINGS: Gallbladder: Gallbladder is distended. Gallbladder sludge is present. Gallbladder wall is upper limits normal at 2.4 mm. No pericholecystic fluid or sonographic Murphy's sign.  Common bile duct: Diameter: 4.1 mm  Liver: No focal lesion identified. Within normal limits in parenchymal echogenicity.  IVC: No abnormality visualized.  Pancreas: Visualized portion unremarkable.  Spleen: Size and appearance within normal limits.  Right Kidney: Length: 10.7 cm. Echogenicity is normal. No mass or hydronephrosis. Mild renal parenchymal thinning.  Left Kidney: Length: 11.2 cm. Echogenicity within normal limits. No mass or hydronephrosis visualized. Mild renal parenchymal thinning.  Abdominal aorta: Visualized portion not aneurysmal, 2.4 cm.  Other findings: None.  IMPRESSION: 1. No evidence for acute cholecystitis. However the gallbladder is distended and sludge is present, consistent with biliary stasis. 2. Mild renal parenchymal thinning without evidence for acute renal abnormality.   Electronically Signed   By: Nolon Nations M.D.   On: 08/17/2014 09:35   Dg Chest Port 1 View  08/16/2014   CLINICAL DATA:  Sepsis, hypertension, diabetes.  EXAM: PORTABLE CHEST - 1 VIEW  COMPARISON:  05/24/2014  FINDINGS: Sternotomy wires and left-sided pacemaker unchanged. Lungs are hypoinflated without focal  consolidation or effusion. There subtle prominence of the perihilar markings which may be due to mild degree of  vascular congestion. There is borderline stable cardiomegaly. Remainder of the exam is unchanged.  IMPRESSION: Hypoinflation with borderline stable cardiomegaly and possible mild vascular congestion.   Electronically Signed   By: Marin Olp M.D.   On: 08/16/2014 15:27     Microbiology: Recent Results (from the past 240 hour(s))  Culture, blood (routine x 2)     Status: None   Collection Time: 08/16/14  2:19 PM  Result Value Ref Range Status   Specimen Description BLOOD LEFT ANTECUBITAL  Final   Special Requests BOTTLES DRAWN AEROBIC ONLY 2CC  Final   Culture NO GROWTH 5 DAYS  Final   Report Status 08/21/2014 FINAL  Final  Urine culture     Status: None   Collection Time: 08/16/14  3:21 PM  Result Value Ref Range Status   Specimen Description URINE, CATHETERIZED  Final   Special Requests NONE  Final   Culture   Final    >=100,000 COLONIES/mL ESCHERICHIA COLI Confirmed Extended Spectrum Beta-Lactamase Producer (ESBL)    Report Status 08/18/2014 FINAL  Final   Organism ID, Bacteria ESCHERICHIA COLI  Final      Susceptibility   Escherichia coli - MIC*    AMPICILLIN >=32 RESISTANT Resistant     CEFAZOLIN >=64 RESISTANT Resistant     CEFTRIAXONE 8 RESISTANT Resistant     CIPROFLOXACIN >=4 RESISTANT Resistant     GENTAMICIN <=1 SENSITIVE Sensitive     IMIPENEM <=0.25 SENSITIVE Sensitive     NITROFURANTOIN 32 SENSITIVE Sensitive     TRIMETH/SULFA >=320 RESISTANT Resistant     AMPICILLIN/SULBACTAM 4 SENSITIVE Sensitive     PIP/TAZO <=4 SENSITIVE Sensitive     * >=100,000 COLONIES/mL ESCHERICHIA COLI  Culture, blood (routine x 2)     Status: None   Collection Time: 08/16/14  3:40 PM  Result Value Ref Range Status   Specimen Description BLOOD RIGHT HAND  Final   Special Requests BOTTLES DRAWN AEROBIC ONLY 3CC  Final   Culture NO GROWTH 5 DAYS  Final   Report Status  08/21/2014 FINAL  Final     Labs: Basic Metabolic Panel:  Recent Labs Lab 08/17/14 0238 08/18/14 0240 08/19/14 0250 08/20/14 0553 08/22/14 0505  NA 134* 135 136 136 136  K 4.0 4.3 4.2 4.4 4.5  CL 101 102 107 107 108  CO2 26 26 25 23 24   GLUCOSE 125* 114* 107* 127* 106*  BUN 21* 22* 16 11 9   CREATININE 1.60* 1.71* 1.31* 1.08* 1.00  CALCIUM 8.6* 8.7* 8.5* 8.7* 8.6*  MG  --   --  2.0  --   --    Liver Function Tests:  Recent Labs Lab 08/16/14 1419 08/17/14 0238 08/18/14 0240 08/19/14 0250 08/20/14 0553  AST 594* 264* 142* 79* 48*  ALT 344* 213* 139* 94* 66*  ALKPHOS 194* 149* 125 109 104  BILITOT 1.7* 0.9 0.8 0.7 0.6  PROT 7.1 6.0* 5.5* 5.4* 5.9*  ALBUMIN 3.5 2.7* 2.5* 2.5* 2.6*   No results for input(s): LIPASE, AMYLASE in the last 168 hours. No results for input(s): AMMONIA in the last 168 hours. CBC:  Recent Labs Lab 08/16/14 1419 08/17/14 0238 08/18/14 0240 08/19/14 0250 08/20/14 0553  WBC 13.8* 9.5 4.8 4.2 5.0  NEUTROABS 12.7*  --   --  1.9  --   HGB 13.7 11.8* 11.0* 10.8* 11.7*  HCT 41.0 35.4* 34.0* 33.3* 35.7*  MCV 87.0 87.4 88.5 87.6 87.7  PLT 187 143* 142* 143* 173   Cardiac Enzymes:  Recent Labs Lab 08/21/14 0754 08/21/14 1200 08/21/14 1917  TROPONINI 0.41* 0.32* 0.36*   BNP: Invalid input(s): POCBNP CBG:  Recent Labs Lab 08/21/14 0751 08/21/14 1202 08/21/14 1617 08/21/14 2149 08/22/14 0759  GLUCAP 91 112* 111* 132* 108*    Time coordinating discharge:  Greater than 30 minutes  Signed:  Taffy Delconte, DO [facility] Pager: [phone] 08/22/2014, 8:31 AM

## 2014-08-24 LAB — CUP PACEART REMOTE DEVICE CHECK
Battery Impedance: 3484 Ohm
Lead Channel Pacing Threshold Amplitude: 1.125 V
Lead Channel Pacing Threshold Pulse Width: 0.4 ms
Lead Channel Pacing Threshold Pulse Width: 0.4 ms
Lead Channel Sensing Intrinsic Amplitude: 1.4 mV
Lead Channel Setting Pacing Amplitude: 2.25 V
Lead Channel Setting Pacing Pulse Width: 0.4 ms
Lead Channel Setting Sensing Sensitivity: 4 mV
MDC IDC MSMT BATTERY REMAINING LONGEVITY: 13 mo
MDC IDC MSMT BATTERY VOLTAGE: 2.69 V
MDC IDC MSMT LEADCHNL RA IMPEDANCE VALUE: 543 Ohm
MDC IDC MSMT LEADCHNL RV IMPEDANCE VALUE: 627 Ohm
MDC IDC MSMT LEADCHNL RV PACING THRESHOLD AMPLITUDE: 0.625 V
MDC IDC SESS DTM: 20160728150026
MDC IDC SET LEADCHNL RV PACING AMPLITUDE: 2 V
MDC IDC STAT BRADY AP VP PERCENT: 18 %
MDC IDC STAT BRADY AP VS PERCENT: 0 %
MDC IDC STAT BRADY AS VP PERCENT: 81 %
MDC IDC STAT BRADY AS VS PERCENT: 1 %

## 2014-08-25 ENCOUNTER — Ambulatory Visit: Payer: Medicare Other | Admitting: Endocrinology

## 2014-08-26 ENCOUNTER — Encounter: Payer: Self-pay | Admitting: Cardiology

## 2014-08-31 ENCOUNTER — Encounter: Payer: Self-pay | Admitting: Cardiovascular Disease

## 2014-09-01 ENCOUNTER — Telehealth: Payer: Self-pay | Admitting: *Deleted

## 2014-09-01 ENCOUNTER — Other Ambulatory Visit: Payer: Self-pay | Admitting: *Deleted

## 2014-09-01 NOTE — Telephone Encounter (Signed)
VM message from pt's daughter, [name]. Returned call to [place]. She sates her mother was discharged from the hospital to [place]. And is still there. [name] is asking when her mother needs to come in for an appt with Dr. Lindi Adie and when she needs to resume her Anastrozole. Please return call to daughter, [name] @ [phone]

## 2014-09-02 ENCOUNTER — Telehealth: Payer: Self-pay

## 2014-09-02 NOTE — Telephone Encounter (Signed)
Let [name] know I would put note in to scheduling to have an appt made.  [name] voiced understanding.  Pof sent

## 2014-09-03 ENCOUNTER — Telehealth: Payer: Self-pay | Admitting: Hematology and Oncology

## 2014-09-03 ENCOUNTER — Telehealth: Payer: Self-pay | Admitting: *Deleted

## 2014-09-03 NOTE — Telephone Encounter (Signed)
err

## 2014-09-03 NOTE — Telephone Encounter (Signed)
Called and left a message with hospital follow up

## 2014-09-08 ENCOUNTER — Other Ambulatory Visit: Payer: Self-pay | Admitting: Pharmacist Clinician (PhC)/ Clinical Pharmacy Specialist

## 2014-09-08 ENCOUNTER — Ambulatory Visit: Payer: Medicare Other | Admitting: Endocrinology

## 2014-09-08 MED ORDER — WARFARIN SODIUM 3 MG PO TABS: ORAL_TABLET | ORAL | Status: DC

## 2014-09-08 NOTE — Telephone Encounter (Signed)
Please have her resume anastrozole. Return appt in 1 month

## 2014-09-08 NOTE — Telephone Encounter (Signed)
Spoke with son, Nicole Kindred, pt was in hospital then [place].  Current dose from d/c is 3 mg daily and she took last dose yesterday.

## 2014-09-11 ENCOUNTER — Telehealth: Payer: Self-pay | Admitting: *Deleted

## 2014-09-11 NOTE — Telephone Encounter (Signed)
Patient family member would like to know if she has been off this medication (anastrazole) for too long to restart it. She has been off of it for 3 weeks currently

## 2014-09-11 NOTE — Telephone Encounter (Signed)
Ok to resume.

## 2014-09-11 NOTE — Telephone Encounter (Signed)
Dtr. [name] called wondering when to resume the medication.    Saw Dr. Geralyn Flash note from 09/08/14 to resume anastrozole and return visit in one month.  Patient is currently scheduled to be seen on 09/17/14.  Called and left message for [name] to resume anastrozole.  Let her know that Dr. Lindi Adie wanted to see her in one month.  She is currently scheduled to see him 09/17/14.  Will check with RN with Dr.Gudena about this.

## 2014-09-15 LAB — PROTIME-INR: INR: 1.7 — AB (ref <=1.1)

## 2014-09-17 ENCOUNTER — Telehealth: Payer: Self-pay | Admitting: Hematology and Oncology

## 2014-09-17 ENCOUNTER — Ambulatory Visit (HOSPITAL_BASED_OUTPATIENT_CLINIC_OR_DEPARTMENT_OTHER): Payer: Medicare Other | Admitting: Hematology and Oncology

## 2014-09-17 ENCOUNTER — Encounter: Payer: Self-pay | Admitting: Hematology and Oncology

## 2014-09-17 DIAGNOSIS — D0512 Intraductal carcinoma in situ of left breast: Secondary | ICD-10-CM | POA: Diagnosis not present

## 2014-09-17 DIAGNOSIS — C50912 Malignant neoplasm of unspecified site of left female breast: Secondary | ICD-10-CM

## 2014-09-17 NOTE — Assessment & Plan Note (Signed)
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 Pathology and radiology review: I discussed with the patient and her family the difference between DCIS and invasive breast cancer. I also discussed the significance of ER and PR receptors and the implications of treatment.  Recommendation:Patient is not a candidate for surgery because of her CHF/pacemaker Current treatment: Anastrozole 1 mg daily started June 2016 but she did not take it for a month because of hospitalizations for UTI and sepsis. Resuming anastrozole 1 mg every other day starting 09/17/2014  Reason for using anastrozole every other day is because of her nephew who has been doing tremendous amount of research into hormonal therapies and how they can affect and change internal metabolisms especially phosphatidylcholine and he would like her not to take higher dose of aromatase inhibitor. He also attributes the aromatase inhibitor to be responsible for her urinary tract infection. It took a long conversation with him on the telephone.  Her plan is to see her back in December after repeating another mammogram to assess response to treatment.

## 2014-09-17 NOTE — Progress Notes (Signed)
Patient Care Team: [name], MD as PCP - General (Internal Medicine)  DIAGNOSIS: No matching staging information was found for the patient.  SUMMARY OF ONCOLOGIC HISTORY:   Breast cancer, left breast   06/30/2014 Initial Diagnosis Left breast biopsy: DCIS ER 100%, PR 40%, 8.6 cm area of grouped segmental calcifications   07/30/2014 -  Anti-estrogen oral therapy anastrozole 1 mg daily palliative treatment    CHIEF COMPLIANT: Follow-up after recent hospitalization for UTI with sepsis  INTERVAL HISTORY: [name] is a 81 year old lady with above-mentioned history of DCIS involving the left breast was currently on palliative treatment with anastrozole. She has not been on any antiestrogen therapy for over a month. She is currently discharged from nursing home and is at home. She is in a wheelchair. I discussed with the family about different options including discontinuation of anti-estrogen therapy. They are keen on resuming anastrozole. Given her advanced age I do not feel strongly that she needs to take any anti-estrogen therapy but patient and family request we have elected to continue with this treatment.  Patient's nephew has been sending me loads of a person research into several chemical compounds and that interactions with antiestrogen therapies.  REVIEW OF SYSTEMS:   Constitutional: Denies fevers, chills or abnormal weight loss Eyes: Denies blurriness of vision Ears, nose, mouth, throat, and face: Denies mucositis or sore throat Respiratory: Denies cough, dyspnea or wheezes Cardiovascular: Denies palpitation, chest discomfort or lower extremity swelling Gastrointestinal:  Denies nausea, heartburn or change in bowel habits Skin: Denies abnormal skin rashes Lymphatics: Denies new lymphadenopathy or easy bruising Neurological: Generalized weakness  All other systems were reviewed with the patient and are negative.  I have reviewed the past medical history, past surgical  history, social history and family history with the patient and they are unchanged from previous note.  ALLERGIES:  is allergic to darvon; digoxin and related; penicillins; percocet; percodan; and vicodin.  MEDICATIONS:  Current Outpatient Prescriptions  Medication Sig Dispense Refill  . anastrozole (ARIMIDEX) 1 MG tablet Take 1 tablet (1 mg total) by mouth daily. 90 tablet 3  . aspirin 81 MG chewable tablet Chew 81 mg by mouth daily.    Marland Kitchen atropine 1 % ophthalmic solution Place 1 drop into the right eye 4 (four) times daily.   0  . donepezil (ARICEPT) 5 MG tablet Take 5 mg by mouth at bedtime.   0  . feeding supplement, GLUCERNA SHAKE, (GLUCERNA SHAKE) LIQD Take 237 mLs by mouth daily.     . folic acid (FOLVITE) 1 MG tablet Take 1 tablet (1 mg total) by mouth daily. 90 tablet 3  . furosemide (LASIX) 40 MG tablet Take 1 tablet (40 mg total) by mouth 2 (two) times daily.    Marland Kitchen gabapentin (NEURONTIN) 100 MG capsule Take 100 mg by mouth 2 (two) times daily.     Marland Kitchen glipiZIDE (GLUCOTROL) 5 MG tablet Take one-half tablet by  mouth daily only if blood  sugar is over 200 (Patient taking differently: Take 5 mg by mouth at bedtime. ) 45 tablet 1  . imipenem-cilastatin 250 mg in sodium chloride 0.9 % 100 mL Inject 250 mg into the vein every 6 (six) hours. Stop after last dose on 08/31/14 39 vial 0  . levothyroxine (SYNTHROID, LEVOTHROID) 50 MCG tablet Take 1 tablet (50 mcg total) by mouth daily before breakfast. 30 tablet 1  . loratadine (CLARITIN) 10 MG tablet Take 10 mg by mouth daily.    . meclizine (ANTIVERT)  25 MG tablet Take 25 mg by mouth 3 (three) times daily as needed for dizziness.    . nitroGLYCERIN (NITRODUR - DOSED IN MG/24 HR) 0.2 mg/hr patch Place 1 patch (0.2 mg total) onto the skin daily. 30 patch 6  . omeprazole (PRILOSEC) 20 MG capsule Take 1 capsule by mouth  daily 90 capsule 2  . potassium chloride (K-DUR,KLOR-CON) 10 MEQ tablet Take 1 tablet (10 mEq total) by mouth 2 (two) times daily.  60 tablet 6  . ranolazine ([name]) 500 MG 12 hr tablet Take 1 tablet (500 mg total) by mouth 2 (two) times daily. 60 tablet 11  . rosuvastatin (CRESTOR) 10 MG tablet Take 1 tablet (10 mg total) by mouth 3 (three) times a week. [id number] Exp: 8-18 (Patient taking differently: Take 10 mg by mouth every Monday, Wednesday, and Friday. ) 21 tablet 0  . traMADol (ULTRAM) 50 MG tablet Take 1 tablet (50 mg total) by mouth every 6 (six) hours as needed for moderate pain. 30 tablet 0  . Vitamin D, Ergocalciferol, (DRISDOL) 50000 UNITS CAPS capsule Take 1,000 Units by mouth daily.     Marland Kitchen warfarin (COUMADIN) 3 MG tablet Take 1 tablet by mouth daily or as directed by coumadin clinic 30 tablet 5  . zolpidem (AMBIEN) 5 MG tablet Take 0.5 tablets (2.5 mg total) by mouth at bedtime. sleep 30 tablet 0  . metoprolol tartrate (LOPRESSOR) 25 MG tablet Take 0.5 tablets (12.5 mg total) by mouth 2 (two) times daily. 60 tablet 0  . Polyvinyl Alcohol-Povidone (REFRESH OP) Place 1 drop into both eyes daily as needed (dry eyes).      No current facility-administered medications for this visit.    PHYSICAL EXAMINATION: ECOG PERFORMANCE STATUS: 3 - Symptomatic, >50% confined to bed  Filed Vitals:   09/17/14 1511  BP: 97/57  Pulse: 85  Temp: 98.5 F (36.9 C)  Resp: 18   Filed Weights   09/17/14 1511  Weight: 175 lb 9.6 oz (79.652 kg)    GENERAL:alert, no distress and comfortable SKIN: skin color, texture, turgor are normal, no rashes or significant lesions EYES: normal, Conjunctiva are pink and non-injected, sclera clear OROPHARYNX:no exudate, no erythema and lips, buccal mucosa, and tongue normal  NECK: supple, thyroid normal size, non-tender, without nodularity LYMPH:  no palpable lymphadenopathy in the cervical, axillary or inguinal LUNGS: clear to auscultation and percussion with normal breathing effort HEART: regular rate & rhythm and ejection systolic murmurs and 1+ lower extremity edema ABDOMEN:abdomen  soft, non-tender and normal bowel sounds Musculoskeletal:no cyanosis of digits and no clubbing  NEURO: alert & oriented x 3 with fluent speech, lower extremity weakness  LABORATORY DATA:  I have reviewed the data as listed   Chemistry      Component Value Date/Time   NA 136 08/22/2014 0505   K 4.5 08/22/2014 0505   CL 108 08/22/2014 0505   CO2 24 08/22/2014 0505   BUN 9 08/22/2014 0505   CREATININE 1.00 08/22/2014 0505   CREATININE 1.33* 02/12/2014 1535      Component Value Date/Time   CALCIUM 8.6* 08/22/2014 0505   ALKPHOS 104 08/20/2014 0553   AST 48* 08/20/2014 0553   ALT 66* 08/20/2014 0553   BILITOT 0.6 08/20/2014 0553       Lab Results  Component Value Date   WBC 5.0 08/20/2014   HGB 11.7* 08/20/2014   HCT 35.7* 08/20/2014   MCV 87.7 08/20/2014   PLT 173 08/20/2014   NEUTROABS 1.9 08/19/2014  ASSESSMENT & PLAN:  Breast cancer, left breast DCIS left breast ER 100% PR 40%, 8.6 area of calcifications status post biopsy on 06/30/2014. Pathology and radiology review: I discussed with the patient and her family the difference between DCIS and invasive breast cancer. I also discussed the significance of ER and PR receptors and the implications of treatment.  Recommendation:Patient is not a candidate for surgery because of her CHF/pacemaker Current treatment: Anastrozole 1 mg daily started June 2016 but she did not take it for a month because of hospitalizations for UTI and sepsis. Resuming anastrozole 1 mg every other day starting 09/17/2014  Reason for using anastrozole every other day is because of her nephew who has been doing tremendous amount of research into hormonal therapies and how they can affect and change internal metabolisms especially phosphatidylcholine and he would like her not to take higher dose of aromatase inhibitor. He also attributes the aromatase inhibitor to be responsible for her urinary tract infection. It took a long conversation with him on the  telephone.  Her plan is to see her back in December after repeating another mammogram to assess response to treatment.     Orders Placed This Encounter  Procedures  . MM Digital Diagnostic Unilat L    Standing Status: Future     Number of Occurrences:      Standing Expiration Date: 09/17/2015    Order Specific Question:  Reason for Exam (SYMPTOM  OR DIAGNOSIS REQUIRED)    Answer:  Left breast DCIS eval response to treatment    Order Specific Question:  Preferred imaging location?    Answer:  External     Comments:  [facility]   The patient has a good understanding of the overall plan. she agrees with it. she will call with any problems that may develop before the next visit here.   Rulon Eisenmenger, MD

## 2014-09-17 NOTE — Telephone Encounter (Signed)
Gave avs & calendar for December. [facility] 12/05 @ 1:00

## 2014-09-18 ENCOUNTER — Ambulatory Visit (INDEPENDENT_AMBULATORY_CARE_PROVIDER_SITE_OTHER): Payer: Medicare Other | Admitting: Pharmacist Clinician (PhC)/ Clinical Pharmacy Specialist

## 2014-09-18 DIAGNOSIS — Z7901 Long term (current) use of anticoagulants: Secondary | ICD-10-CM

## 2014-09-22 ENCOUNTER — Telehealth: Payer: Self-pay | Admitting: Endocrinology

## 2014-09-22 NOTE — Telephone Encounter (Signed)
Please see Dr. Dwyane Dee notes

## 2014-09-22 NOTE — Telephone Encounter (Signed)
Stop Glipizide and no insulin also

## 2014-09-22 NOTE — Telephone Encounter (Signed)
error 

## 2014-09-22 NOTE — Telephone Encounter (Signed)
Family of pt called in said sugar level of pt was 48 they gave grape juice and sugar and sugar levele remained the same , family member was advise to call ems per Suanne Marker

## 2014-09-22 NOTE — Telephone Encounter (Signed)
Patient's son is aware 

## 2014-09-23 ENCOUNTER — Encounter: Payer: Self-pay | Admitting: Cardiovascular Disease

## 2014-09-24 ENCOUNTER — Telehealth: Payer: Self-pay | Admitting: Endocrinology

## 2014-09-28 ENCOUNTER — Ambulatory Visit: Payer: Medicare Other | Admitting: Endocrinology

## 2014-09-29 LAB — PROTIME-INR: INR: 2.1 — AB (ref 0.9–1.1)

## 2014-09-30 ENCOUNTER — Ambulatory Visit (INDEPENDENT_AMBULATORY_CARE_PROVIDER_SITE_OTHER): Payer: Medicare Other | Admitting: Endocrinology

## 2014-09-30 ENCOUNTER — Ambulatory Visit (INDEPENDENT_AMBULATORY_CARE_PROVIDER_SITE_OTHER): Payer: Medicare Other | Admitting: Pharmacist Clinician (PhC)/ Clinical Pharmacy Specialist

## 2014-09-30 ENCOUNTER — Other Ambulatory Visit: Payer: Self-pay | Admitting: *Deleted

## 2014-09-30 ENCOUNTER — Encounter: Payer: Self-pay | Admitting: Endocrinology

## 2014-09-30 VITALS — BP 110/72 | HR 73 | Temp 98.4°F | Resp 14 | Ht 64.0 in

## 2014-09-30 DIAGNOSIS — E063 Autoimmune thyroiditis: Secondary | ICD-10-CM

## 2014-09-30 DIAGNOSIS — Z7901 Long term (current) use of anticoagulants: Secondary | ICD-10-CM

## 2014-09-30 DIAGNOSIS — E038 Other specified hypothyroidism: Secondary | ICD-10-CM | POA: Diagnosis not present

## 2014-09-30 DIAGNOSIS — E119 Type 2 diabetes mellitus without complications: Secondary | ICD-10-CM | POA: Diagnosis not present

## 2014-09-30 LAB — POCT GLYCOSYLATED HEMOGLOBIN (HGB A1C): Hemoglobin A1C: 7.1

## 2014-09-30 MED ORDER — LINAGLIPTIN 5 MG PO TABS
5.0000 mg | ORAL_TABLET | Freq: Every day | ORAL | Status: DC
Start: 2014-09-30 — End: 2015-06-08

## 2014-09-30 NOTE — Progress Notes (Signed)
Patient ID: [name], female   DOB: 11-10-1920, 81 y.o.   MRN: [id number]   Reason for Appointment: Diabetes follow-up   History of Present Illness   Diagnosis: Type 2 diabetes mellitus, date of diagnosis: 1972.   She had been on insulin for several years and this had been tapered off in 2013 because of weight loss and lower blood sugars. Subsequently blood sugars had been generally reasonably good.  Because of her age and multiple medical problems she has been monitored without insulin  Recent history:  Her Lantus was restarted at 10 units daily  in February and subsequently because of relatively low blood sugars in the mornings and before supper this was stopped on her visit in June but she has not been seen in follow-up since then He was taking glipizide before supper and initially was given 2.5 mg and this was increased to 5 mg since blood sugars were higher after supper  Although her son initially said that he stopped her insulin 2 weeks ago he then states that he stopped that in June when he was supposed to She currently had an episode of severe hypoglycemia with mental status changes on 09/20/14 in the morning requiring D50 by EMS Before that she was having  increased readings of about 180-300 after supper but usually fairly good in the morning However even though she was supposed to stop her glipizide did appear that she was continuing to get low sugars waking up for the next 3 mornings She is not on any medications now for diabetes Since 9/9 her blood sugars have been between 97-155 in the morning and ranging from 192-315 at night She does drink about half a cup of cranberry/grape juice at suppertime Her son thinks that she has already had decreased appetite lately  Monitors blood glucose: 2-5 times daily.  Glucometer: Accucheck [facility].  Recent readings from download: As above  Meals: 2 meals at about noon and 7 pm; sometimes has lemonade or sprite. Has small  portions Dietician visit: Most recent:2001.     Wt Readings from Last 3 Encounters:  09/17/14 175 lb 9.6 oz (79.652 kg)  08/22/14 192 lb 14.4 oz (87.5 kg)  07/30/14 181 lb 12.8 oz (82.464 kg)   Lab Results  Component Value Date   HGBA1C 7.1 09/30/2014   HGBA1C 7.3* 05/25/2014   HGBA1C 8.2* 04/16/2014   Lab Results  Component Value Date   LDLCALC 61 06/24/2013   CREATININE 1.00 08/22/2014       Medication List       This list is accurate as of: 09/30/14 11:59 PM.  Always use your most recent med list.               anastrozole 1 MG tablet  Commonly known as:  ARIMIDEX  Take 1 tablet (1 mg total) by mouth daily.     aspirin 81 MG chewable tablet  Chew 81 mg by mouth daily.     atropine 1 % ophthalmic solution  Place 1 drop into the right eye 4 (four) times daily.     donepezil 5 MG tablet  Commonly known as:  ARICEPT  Take 5 mg by mouth at bedtime.     feeding supplement (GLUCERNA SHAKE) Liqd  Take 237 mLs by mouth daily.     folic acid 1 MG tablet  Commonly known as:  FOLVITE  Take 1 tablet (1 mg total) by mouth daily.     furosemide 40 MG tablet  Commonly known as:  LASIX  Take 1 tablet (40 mg total) by mouth 2 (two) times daily.     gabapentin 100 MG capsule  Commonly known as:  NEURONTIN  Take 100 mg by mouth 2 (two) times daily.     glipiZIDE 5 MG tablet  Commonly known as:  GLUCOTROL  Take one-half tablet by  mouth daily only if blood  sugar is over 200     imipenem-cilastatin 250 mg in sodium chloride 0.9 % 100 mL  Inject 250 mg into the vein every 6 (six) hours. Stop after last dose on 08/31/14     levothyroxine 50 MCG tablet  Commonly known as:  SYNTHROID, LEVOTHROID  Take 1 tablet (50 mcg total) by mouth daily before breakfast.     linagliptin 5 MG Tabs tablet  Commonly known as:  TRADJENTA  Take 1 tablet (5 mg total) by mouth daily.     loratadine 10 MG tablet  Commonly known as:  CLARITIN  Take 10 mg by mouth daily.     meclizine  25 MG tablet  Commonly known as:  ANTIVERT  Take 25 mg by mouth 3 (three) times daily as needed for dizziness.     metoprolol tartrate 25 MG tablet  Commonly known as:  LOPRESSOR  Take 0.5 tablets (12.5 mg total) by mouth 2 (two) times daily.     nitroGLYCERIN 0.2 mg/hr patch  Commonly known as:  NITRODUR - Dosed in mg/24 hr  Place 1 patch (0.2 mg total) onto the skin daily.     omeprazole 20 MG capsule  Commonly known as:  PRILOSEC  Take 1 capsule by mouth  daily     potassium chloride 10 MEQ tablet  Commonly known as:  K-DUR,KLOR-CON  Take 1 tablet (10 mEq total) by mouth 2 (two) times daily.     ranolazine 500 MG 12 hr tablet  Commonly known as:  [name]  Take 1 tablet (500 mg total) by mouth 2 (two) times daily.     REFRESH OP  Place 1 drop into both eyes daily as needed (dry eyes).     rosuvastatin 10 MG tablet  Commonly known as:  CRESTOR  Take 1 tablet (10 mg total) by mouth 3 (three) times a week. [id number] Exp: 8-18     traMADol 50 MG tablet  Commonly known as:  ULTRAM  Take 1 tablet (50 mg total) by mouth every 6 (six) hours as needed for moderate pain.     Vitamin D (Ergocalciferol) 50000 UNITS Caps capsule  Commonly known as:  DRISDOL  Take 1,000 Units by mouth daily.     warfarin 3 MG tablet  Commonly known as:  COUMADIN  Take 1 tablet by mouth daily or as directed by coumadin clinic     zolpidem 5 MG tablet  Commonly known as:  AMBIEN  Take 0.5 tablets (2.5 mg total) by mouth at bedtime. sleep        Past Medical History  Diagnosis Date  . Hypertension   . Coronary artery disease     a. 1994 s/p cabg;  b. 09/2013 MV: large, sev intensity, partially reversible inf, apical defect, prior inf/ap infarct w/ mild peri-infarct ischemia->Med Rx.  . Legally blind   . CKD (chronic kidney disease), stage III     a. iii - iv.  . Anemia   . Hypothyroid   . Gastric ulcer   . Hiatal hernia   . Dyspepsia   . UTI (lower urinary tract infection)   .  H/O: GI  bleed 12//13  . High cholesterol   . Chronic combined systolic and diastolic CHF (congestive heart failure)     a. 01/2014 Echo: EF @ least mod-sev reduced with HK of lat/apical, basal inf walls. basalpost AK, Gr 1DD.  Marland Kitchen DVT of upper extremity (deep vein thrombosis) 06/13/2012    BUE  . Seasonal allergies   . Allergy to perfume   . Type II diabetes mellitus   . History of blood transfusion 2013  . GERD (gastroesophageal reflux disease)   . Daily headache   . Arthritis   . PAF (paroxysmal atrial fibrillation)     a. CHA2DS2VASc = 7-->coumadin.  . 2Nd degree atrioventricular block     a. s/p MDT ADDR01 DC PPM (ser #: [id number]).    Past Surgical History  Procedure Laterality Date  . Esophagogastroduodenoscopy  12/22/2011    Procedure: ESOPHAGOGASTRODUODENOSCOPY (EGD);  Surgeon: Gatha Mayer, MD;  Location: [facility];  Service: Endoscopy;  Laterality: N/A;  . Cardioversion  06/06/06    successful  . Insert / replace / remove pacemaker  06/29/2006    Medtronic adapta  . Cardiac catheterization  10/25/92  . Cardiac catheterization  11/18/03    w/grafts 100%CX LAD 80 & 100%  . Cardiac catheterization  01/24/05    diffuse disease of native vessels  . Cardiac catheterization  06/06/06    severe native CAD  . Coronary artery bypass graft  10/27/92    LIMA to LAD,SVG to LAD second diagonal,obtuse maraginal of the CX and posterior descendingbranch of the RCA  . Cataract extraction w/ intraocular lens  implant, bilateral Bilateral   . Refractive surgery Bilateral     Family History  Problem Relation Age of Onset  . Breast cancer Daughter   . Diabetes Son   . Diabetes Daughter   . Heart disease Father   . Diabetes Father   . Colon polyps Daughter   . Heart attack Brother   . Diabetes Brother   . Stroke Sister     Social History:  reports that she has never smoked. She has never used smokeless tobacco. She reports that she does not drink alcohol or use illicit drugs.  Allergies:    Allergies  Allergen Reactions  . Darvon     Unknown reaction   . Digoxin And Related Nausea Only  . Penicillins Other (See Comments)    Was told had allergy from childhood... Unknown reaction  . Percocet [Oxycodone-Acetaminophen] Other (See Comments)    confused  . Percodan [Oxycodone-Aspirin]   . Vicodin [Hydrocodone-Acetaminophen] Other (See Comments)    confused    REVIEW of systems:  Hypothyroidism: She is taking levothyroxine  50 mcg daily withnext to half tablet twice a week since her last visit when her TSH was high TSH is back to normal  Lab Results  Component Value Date   TSH 3.116 08/19/2014   TSH 2.65 06/25/2014   TSH 4.519* 05/25/2014   FREET4 0.70 06/25/2014   FREET4 0.65 06/24/2013   FREET4 0.93 01/24/2013     She has had renal dysfunction which is long-standing but no records available recently  Lab Results  Component Value Date   CREATININE 1.00 08/22/2014    Has had periodic pedal edema. Does wear elastic stockings  She has significant neuropathy with sensory loss on previous  foot exam.     Examination:   BP 110/72 mmHg  Pulse 73  Temp(Src) 98.4 F (36.9 C)  Resp 14  Ht 5\' 4"  (  1.626 m)  Wt   SpO2 97%  There is no weight on file to calculate BMI.    Assesment/plan:   1.  Diabetes type 2, with obesity, long-standing  Since she has had severe hypoglycemia she has been taken off her glipizide Also not clear whether her son was still getting her insulin since most of her low readings have been fasting and she was only taking immediate release glipizide  Also blood sugars had been persistently low mood for mornings in a row Even though she is having some readings around 200 after supper most likely this is not significant for her  Will give her a trial of Tradjenta to help regulate her diabetes and avoid potential hypoglycemia She can also have more liberal diet she is not eating as much Discussed blood sugar goals with her son since she  is 38 years old and has multiple comorbid issues  2. Hypothyroidism: She has been on a  relatively low dose of 50 mcg, 8 tablets a week and her TSH is pending  Will have her labs checked through her home nurse  Counseling time on subjects discussed above is over 50% of today's 25 minute visit   Maurisio Ruddy 10/01/2014, 4:32 PM

## 2014-10-02 ENCOUNTER — Telehealth: Payer: Self-pay | Admitting: *Deleted

## 2014-10-02 NOTE — Telephone Encounter (Signed)
Check Januvia and onglyza

## 2014-10-02 NOTE — Telephone Encounter (Signed)
I spoke with patients daughter and advised her to call her moms insurance company to see if there is a cheaper alternative, daughter agreed and will call back to let me know if there is any.

## 2014-10-02 NOTE — Telephone Encounter (Signed)
Patients daughter called, she said the [name] is to expensive ($95), her daughter did call the insurance company who said the only cheaper alternative would be the Avandia and it would cost the same as the Glipizide. Please advise if okay to switch.  CB# [name] [id number]

## 2014-10-02 NOTE — Telephone Encounter (Signed)
Daughter said they were as expensive as the other medications

## 2014-10-02 NOTE — Telephone Encounter (Signed)
No Rx to be given

## 2014-10-02 NOTE — Telephone Encounter (Signed)
Patient daughter stated that her medication linagliptin (TRADJENTA) 5 MG TABS tablet, is to expensive, is there another alternative that is less expensive, please advise, call before 12:00

## 2014-10-05 ENCOUNTER — Encounter: Payer: Self-pay | Admitting: Cardiovascular Disease

## 2014-10-05 ENCOUNTER — Ambulatory Visit (INDEPENDENT_AMBULATORY_CARE_PROVIDER_SITE_OTHER): Payer: Medicare Other | Admitting: Cardiovascular Disease

## 2014-10-05 VITALS — BP 82/64 | HR 70 | Ht 64.0 in | Wt 172.0 lb

## 2014-10-05 DIAGNOSIS — I442 Atrioventricular block, complete: Secondary | ICD-10-CM | POA: Diagnosis not present

## 2014-10-05 DIAGNOSIS — I482 Chronic atrial fibrillation, unspecified: Secondary | ICD-10-CM

## 2014-10-05 DIAGNOSIS — I480 Paroxysmal atrial fibrillation: Secondary | ICD-10-CM

## 2014-10-05 DIAGNOSIS — Z950 Presence of cardiac pacemaker: Secondary | ICD-10-CM

## 2014-10-05 DIAGNOSIS — I25119 Atherosclerotic heart disease of native coronary artery with unspecified angina pectoris: Secondary | ICD-10-CM

## 2014-10-05 DIAGNOSIS — I5043 Acute on chronic combined systolic (congestive) and diastolic (congestive) heart failure: Secondary | ICD-10-CM

## 2014-10-05 MED ORDER — RANOLAZINE ER 500 MG PO TB12: 500.0000 mg | ORAL_TABLET | Freq: Two times a day (BID) | ORAL | Status: DC

## 2014-10-05 NOTE — Progress Notes (Signed)
Patient ID: [name], female   DOB: 19-Mar-1920, 81 y.o.   MRN: [id number]     Cardiology Office Note   Date:  10/05/2014   ID:  [name], DOB 19-Nov-1920, MRN [id number]  PCP:  Kevan Ny, MD  Cardiologist:   Sanda Klein, MD   Chief Complaint  Patient presents with  . Follow-up  . Dizziness    SPELLS  . Shortness of Breath  . Chest Pain      History of Present Illness: [name] is a 81 y.o. female who presents for follow-up for numerous cardiac problems. She has a history of chronic combined systolic and diastolic heart failure (EF 35%) likely due to ischemic cardiomyopathy (coronary bypass surgery 1994, nuclear perfusion study September 2015 with large partly reversible inferoapical defect), recurrent paroxysmal atrial fibrillation, permanent pacemaker implanted for second-degree AV block, chronic kidney disease stage III-4, hypertension, hyperlipidemia, history of previous gastrointestinal bleeding but currently on warfarin anticoagulation therapy.  Her functional status has deteriorated slowly. This is partly due to the fact that she is virtually completely blind, but also clearly due to reduced cardiopulmonary reserve. She becomes short of breath walking inside the house and spends most of her day in bed. She does not describe angina pectoris. She does have occasional chest discomfort that resolves promptly with antiacid (Gaviscon) consistently.  Antihypertensives and antianginal medicines have been gradually curtailed as her blood pressures become lower and lower with age. She is now taking metoprolol only when her systolic blood pressure exceeds 110 and this happens no more than 3 times a week. Recently she has had problems with hypoglycemia and she is now only taking oral anti-diabetics on a "as needed" basis when her blood sugar exceeds 200. Just recently EMS was called for an episode of partial unresponsiveness when her blood sugar dropped to less  than 40.  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 She is receiving antiestrogen therapy with anastrozole.  We had previously estimated her "dry weight to be around 180 pounds and she is well beneath this today. She does not have orthopnea. She does not have much in the way of lower extremity edema today. Trivial ankle puffiness. At least on one occasion she has been hospitalized with hypovolemic acute on chronic renal insufficiency.  Past Medical History  Diagnosis Date  . Hypertension   . Coronary artery disease     a. 1994 s/p cabg;  b. 09/2013 MV: large, sev intensity, partially reversible inf, apical defect, prior inf/ap infarct w/ mild peri-infarct ischemia->Med Rx.  . Legally blind   . CKD (chronic kidney disease), stage III     a. iii - iv.  . Anemia   . Hypothyroid   . Gastric ulcer   . Hiatal hernia   . Dyspepsia   . UTI (lower urinary tract infection)   . H/O: GI bleed 12//13  . High cholesterol   . Chronic combined systolic and diastolic CHF (congestive heart failure)     a. 01/2014 Echo: EF @ least mod-sev reduced with HK of lat/apical, basal inf walls. basalpost AK, Gr 1DD.  Marland Kitchen DVT of upper extremity (deep vein thrombosis) 06/13/2012    BUE  . Seasonal allergies   . Allergy to perfume   . Type II diabetes mellitus   . History of blood transfusion 2013  .  GERD (gastroesophageal reflux disease)   . Daily headache   . Arthritis   . PAF (paroxysmal atrial fibrillation)     a. CHA2DS2VASc = 7-->coumadin.  . 2Nd degree atrioventricular block     a. s/p MDT ADDR01 DC PPM (ser #: [id number]).    Past Surgical History  Procedure Laterality Date  . Esophagogastroduodenoscopy  12/22/2011    Procedure: ESOPHAGOGASTRODUODENOSCOPY (EGD);  Surgeon: Gatha Mayer, MD;  Location: [facility];  Service: Endoscopy;  Laterality:  N/A;  . Cardioversion  06/06/06    successful  . Insert / replace / remove pacemaker  06/29/2006    Medtronic adapta  . Cardiac catheterization  10/25/92  . Cardiac catheterization  11/18/03    w/grafts 100%CX LAD 80 & 100%  . Cardiac catheterization  01/24/05    diffuse disease of native vessels  . Cardiac catheterization  06/06/06    severe native CAD  . Coronary artery bypass graft  10/27/92    LIMA to LAD,SVG to LAD second diagonal,obtuse maraginal of the CX and posterior descendingbranch of the RCA  . Cataract extraction w/ intraocular lens  implant, bilateral Bilateral   . Refractive surgery Bilateral      Current Outpatient Prescriptions  Medication Sig Dispense Refill  . anastrozole (ARIMIDEX) 1 MG tablet Take 1 tablet (1 mg total) by mouth daily. 90 tablet 3  . aspirin 81 MG chewable tablet Chew 81 mg by mouth daily.    Marland Kitchen atropine 1 % ophthalmic solution Place 1 drop into the right eye 4 (four) times daily.   0  . donepezil (ARICEPT) 5 MG tablet Take 5 mg by mouth at bedtime.   0  . feeding supplement, GLUCERNA SHAKE, (GLUCERNA SHAKE) LIQD Take 237 mLs by mouth daily.     . folic acid (FOLVITE) 1 MG tablet Take 1 tablet (1 mg total) by mouth daily. 90 tablet 3  . furosemide (LASIX) 40 MG tablet Take 1 tablet (40 mg total) by mouth 2 (two) times daily.    Marland Kitchen gabapentin (NEURONTIN) 100 MG capsule Take 100 mg by mouth 2 (two) times daily.     Marland Kitchen glipiZIDE (GLUCOTROL) 5 MG tablet Take one-half tablet by  mouth daily only if blood  sugar is over 200 45 tablet 1  . imipenem-cilastatin 250 mg in sodium chloride 0.9 % 100 mL Inject 250 mg into the vein every 6 (six) hours. Stop after last dose on 08/31/14 39 vial 0  . levothyroxine (SYNTHROID, LEVOTHROID) 50 MCG tablet Take 1 tablet (50 mcg total) by mouth daily before breakfast. 30 tablet 1  . linagliptin (TRADJENTA) 5 MG TABS tablet Take 1 tablet (5 mg total) by mouth daily. 30 tablet 1  . loratadine (CLARITIN) 10 MG tablet Take 10 mg by  mouth daily.    . meclizine (ANTIVERT) 25 MG tablet Take 25 mg by mouth 3 (three) times daily as needed for dizziness.    . nitroGLYCERIN (NITRODUR - DOSED IN MG/24 HR) 0.2 mg/hr patch Place 1 patch (0.2 mg total) onto the skin daily. 30 patch 6  . omeprazole (PRILOSEC) 20 MG capsule Take 1 capsule by mouth  daily 90 capsule 2  . Polyvinyl Alcohol-Povidone (REFRESH OP) Place 1 drop into both eyes daily as needed (dry eyes).     . potassium chloride (K-DUR,KLOR-CON) 10 MEQ tablet Take 1 tablet (10 mEq total) by mouth 2 (two) times daily. 60 tablet 6  . ranolazine ([name]) 500 MG 12 hr tablet Take 1 tablet (500 mg total)  by mouth 2 (two) times daily. 112 tablet 0  . rosuvastatin (CRESTOR) 10 MG tablet Take 1 tablet (10 mg total) by mouth 3 (three) times a week. VOZ:[id number] Exp: 8-18 (Patient taking differently: Take 10 mg by mouth every Monday, Wednesday, and Friday. ) 21 tablet 0  . traMADol (ULTRAM) 50 MG tablet Take 1 tablet (50 mg total) by mouth every 6 (six) hours as needed for moderate pain. 30 tablet 0  . Vitamin D, Ergocalciferol, (DRISDOL) 50000 UNITS CAPS capsule Take 1,000 Units by mouth daily.     Marland Kitchen warfarin (COUMADIN) 3 MG tablet Take 1 tablet by mouth daily or as directed by coumadin clinic 30 tablet 5  . zolpidem (AMBIEN) 5 MG tablet Take 0.5 tablets (2.5 mg total) by mouth at bedtime. sleep 30 tablet 0   No current facility-administered medications for this visit.    Allergies:   Darvon; Digoxin and related; Penicillins; Percocet; Percodan; and Vicodin    Social History:  The patient  reports that she has never smoked. She has never used smokeless tobacco. She reports that she does not drink alcohol or use illicit drugs.   Family History:  The patient's family history includes Breast cancer in her daughter; Colon polyps in her daughter; Diabetes in her brother, daughter, father, and son; Heart attack in her brother; Heart disease in her father; Stroke in her sister.    ROS:   Please see the history of present illness.    Otherwise, review of systems positive for none.   All other systems are reviewed and negative.    PHYSICAL EXAM: VS:  BP 82/64 mmHg  Pulse 70  Ht 5\' 4"  (1.626 m)  Wt 172 lb (78.019 kg)  BMI 29.51 kg/m2 , BMI Body mass index is 29.51 kg/(m^2).  General: Alert, oriented x3, no distress Head: no evidence of trauma, PERRL, EOMI, no exophtalmos or lid lag, no myxedema, no xanthelasma; normal ears, nose and oropharynx Neck: normal jugular venous pulsations and no hepatojugular reflux; brisk carotid pulses without delay and no carotid bruits Chest: clear to auscultation, no signs of consolidation by percussion or palpation, normal fremitus, symmetrical and full respiratory excursions Cardiovascular: normal position and quality of the apical impulse, irregular rhythm, normal first and second heart sounds, no murmurs, rubs or gallops Abdomen: no tenderness or distention, no masses by palpation, no abnormal pulsatility or arterial bruits, normal bowel sounds, no hepatosplenomegaly Extremities: no clubbing, cyanosis or edema; 2+ radial, ulnar and brachial pulses bilaterally; 2+ right femoral, posterior tibial and dorsalis pedis pulses; 2+ left femoral, posterior tibial and dorsalis pedis pulses; no subclavian or femoral bruits Neurological: grossly nonfocal Psych: euthymic mood, full affect   EKG:  EKG is not ordered today.   Recent Labs: 07/21/2014: B Natriuretic Peptide 261.6* 08/19/2014: Magnesium 2.0; TSH 3.116 08/20/2014: ALT 66*; Hemoglobin 11.7*; Platelets 173 08/22/2014: BUN 9; Creatinine, Ser 1.00; Potassium 4.5; Sodium 136    Lipid Panel    Component Value Date/Time   CHOL 170 10/24/2013 1006   TRIG 201.0* 10/24/2013 1006   HDL 30.50* 10/24/2013 1006   CHOLHDL 6 10/24/2013 1006   VLDL 40.2* 10/24/2013 1006   LDLCALC 61 06/24/2013 1127   LDLDIRECT 92.3 10/24/2013 1006      Wt Readings from Last 3 Encounters:  10/05/14 172 lb (78.019  kg)  09/17/14 175 lb 9.6 oz (79.652 kg)  08/22/14 192 lb 14.4 oz (87.5 kg)    ECHO August 19, 2014  - Left ventricle: There is akinesis of basal  and mid inferolateral, basal inferior wall and hypokinesis of the basal and mid anterolateral walls. The cavity size was normal. Systolic function was moderately to severely reduced. The estimated ejection fraction was in the range of 30% to 35%. Doppler parameters are consistent with abnormal left ventricular relaxation (grade 1 diastolic dysfunction). Doppler parameters are consistent with elevated ventricular end-diastolic filling pressure. - Aortic valve: Trileaflet; moderately thickened, moderately calcified leaflets. - Aortic root: The aortic root was normal in size. - Mitral valve: There was mild regurgitation. - Left atrium: The atrium was normal in size. - Right ventricle: Systolic function was normal. - Right atrium: The atrium was normal in size. - Tricuspid valve: There was trivial regurgitation. - Pulmonary arteries: Systolic pressure was at the upper limits of normal. PA peak pressure: 38 mm Hg (S). - Inferior vena cava: The vessel was normal in size. - Pericardium, extracardiac: There was no pericardial effusion.   ASSESSMENT AND PLAN:  1. Chronic combined systolic and diastolic heart failure, as far as I could tell currently euvolemic, very sedentary lifestyle precludes assessment of true functional status. She is "below her dry weight", which we have estimated to be around 180 pounds. Encouraged to drink fluids but no adjustment was made to diuretics.  2. Hypotension, will stop her beta blocker altogether since intermittent dosing may be more dangerous than stopping it altogether.  3. Coronary artery disease status post bypass surgery over 20 years ago with ischemic cardiomyopathy and known reversible ischemia. The only antianginal she can tolerate at this time is [name] due to her low blood pressure.  Continue aspirin and rosuvastatin.  4. Asymptomatic paroxysmal atrial fibrillation -on the last pacemaker check this was less than 0.1% and there were no episodes of high ventricular rate. Continue warfarin anticoagulation since she has had both venous thromboembolic events and atrial fibrillation, but the threshold for discontinuation is low in view of previous problems with GI bleeding. Keep INR 2.0-2.5. Consider continuing aspirin alone if bleeding occurs  5. Normal dual-chamber permanent pacemaker function (last check roughly 6 weeks ago) with 18% atrial pacing and 99% ventricular pacing due to high-grade second-degree AV block and intermittent third-degree heart block. In July the estimated generator longevity was estimated at about 13 months. Her next [facility] download is scheduled on October 31  6. Type 2 diabetes mellitus - recent hemoglobin A1c 6.1%  7. Chronic kidney disease stage III - recent creatinine 1.0  8. Recently diagnosed left breast ductal carcinoma in situ, on medical/antiestrogen therapy  Current medicines are reviewed at length with the patient today.  The patient does not have concerns regarding medicines.  The following changes have been made:  no change  Labs/ tests ordered today include:  No orders of the defined types were placed in this encounter.      Patient Instructions  Medication Instructions:   STOP METOPROLOL  Follow-Up:  4 MONTHS         Signed, CROITORU,MIHAI, MD  10/05/2014 7:32 PM    Sanda Klein, MD, [facility] [phone] office [phone] pager

## 2014-10-05 NOTE — Patient Instructions (Signed)
Medication Instructions:   STOP METOPROLOL  Follow-Up:  4 MONTHS

## 2014-10-09 NOTE — Telephone Encounter (Signed)
No treatment for diabetes needed.  The need to call if blood sugar is over 300.  She needs to cut back on regular soft drinks

## 2014-10-09 NOTE — Telephone Encounter (Signed)
If patient b/s is over 200 do she a half glipizide or a whole one, please advise Please advise [phone]

## 2014-10-09 NOTE — Telephone Encounter (Signed)
Noted, patients son is aware 

## 2014-10-13 ENCOUNTER — Encounter: Payer: Self-pay | Admitting: Cardiovascular Disease

## 2014-10-13 LAB — PROTIME-INR: INR: 3.9 — AB (ref 0.9–1.1)

## 2014-10-14 ENCOUNTER — Ambulatory Visit (INDEPENDENT_AMBULATORY_CARE_PROVIDER_SITE_OTHER): Payer: Medicare Other | Admitting: Pharmacist Clinician (PhC)/ Clinical Pharmacy Specialist

## 2014-10-14 DIAGNOSIS — Z7901 Long term (current) use of anticoagulants: Secondary | ICD-10-CM

## 2014-10-15 ENCOUNTER — Other Ambulatory Visit: Payer: Self-pay | Admitting: *Deleted

## 2014-10-15 ENCOUNTER — Other Ambulatory Visit: Payer: Self-pay | Admitting: Cardiovascular Disease

## 2014-10-15 MED ORDER — GLUCOSE BLOOD VI STRP: ORAL_STRIP | Status: DC

## 2014-10-15 NOTE — Telephone Encounter (Signed)
Patient need a refill of diabetic test strips, Accu Check Aviva meter

## 2014-10-15 NOTE — Telephone Encounter (Signed)
rx faxed

## 2014-10-16 ENCOUNTER — Telehealth: Payer: Self-pay | Admitting: *Deleted

## 2014-10-16 ENCOUNTER — Telehealth: Payer: Self-pay | Admitting: Cardiovascular Disease

## 2014-10-16 ENCOUNTER — Other Ambulatory Visit: Payer: Self-pay | Admitting: *Deleted

## 2014-10-16 DIAGNOSIS — I5032 Chronic diastolic (congestive) heart failure: Secondary | ICD-10-CM

## 2014-10-16 DIAGNOSIS — I5042 Chronic combined systolic (congestive) and diastolic (congestive) heart failure: Secondary | ICD-10-CM

## 2014-10-16 MED ORDER — POTASSIUM CHLORIDE CRYS ER 10 MEQ PO TBCR: 10.0000 meq | EXTENDED_RELEASE_TABLET | Freq: Two times a day (BID) | ORAL | Status: DC

## 2014-10-16 MED ORDER — WARFARIN SODIUM 3 MG PO TABS: ORAL_TABLET | ORAL | Status: DC

## 2014-10-16 NOTE — Telephone Encounter (Signed)
Patient's daughter called inquiring about supplements that she left

## 2014-10-16 NOTE — Telephone Encounter (Signed)
Daughter brought 4 different OTC products to see if okay for [place]ance to take.  Advised [name] that [place] would be fine, and we know it is safe.  SOD (super oxide dismutase) doesn't show any evidence that it is absorbed orally.  Multi vitamin (Source of Life Gold) would be fine, but probably no different than if she just got a Centrum Silver or One-a-Day.  Last was astaxanthin, could find no reason this would be harmful or interfere, but not sure patient needs it or any of these.  Daughter voiced understanding.

## 2014-10-16 NOTE — Telephone Encounter (Signed)
Patient daughter is calling back wondering about the supplements she left with Dristin

## 2014-10-16 NOTE — Telephone Encounter (Signed)
Rx request sent to pharmacy.  

## 2014-10-22 ENCOUNTER — Other Ambulatory Visit: Payer: Self-pay | Admitting: Endocrinology

## 2014-10-22 ENCOUNTER — Telehealth: Payer: Self-pay | Admitting: Endocrinology

## 2014-10-22 ENCOUNTER — Telehealth: Payer: Self-pay | Admitting: Pharmacist Clinician (PhC)/ Clinical Pharmacy Specialist

## 2014-10-22 NOTE — Telephone Encounter (Signed)
Patient called stating that she would like a refill on her Rx  Rx: Synthroid   Pharmacy: [facility]   Thank you

## 2014-10-22 NOTE — Telephone Encounter (Signed)
rx sent

## 2014-10-22 NOTE — Telephone Encounter (Signed)
For 2 days, unable to get venous blood draw.  Advised to have patient continue same dose, re-try next Tuesday.

## 2014-10-27 LAB — PROTIME-INR: INR: 1.2 — AB (ref <=1.1)

## 2014-10-28 ENCOUNTER — Ambulatory Visit (INDEPENDENT_AMBULATORY_CARE_PROVIDER_SITE_OTHER): Payer: Medicare Other | Admitting: Pharmacist

## 2014-10-28 DIAGNOSIS — I824Z9 Acute embolism and thrombosis of unspecified deep veins of unspecified distal lower extremity: Secondary | ICD-10-CM

## 2014-10-28 DIAGNOSIS — Z7901 Long term (current) use of anticoagulants: Secondary | ICD-10-CM

## 2014-10-29 ENCOUNTER — Telehealth: Payer: Self-pay | Admitting: Endocrinology

## 2014-10-29 ENCOUNTER — Other Ambulatory Visit: Payer: Self-pay | Admitting: *Deleted

## 2014-10-29 MED ORDER — LEVOTHYROXINE SODIUM 50 MCG PO TABS: ORAL_TABLET | ORAL | Status: DC

## 2014-10-30 ENCOUNTER — Encounter: Payer: Self-pay | Admitting: Cardiovascular Disease

## 2014-11-02 ENCOUNTER — Telehealth: Payer: Self-pay | Admitting: Endocrinology

## 2014-11-02 NOTE — Telephone Encounter (Signed)
error 

## 2014-11-02 NOTE — Telephone Encounter (Signed)
No answer on call back 

## 2014-11-02 NOTE — Telephone Encounter (Signed)
[name] calling to speak with nurse regarding glucose level is high

## 2014-11-03 ENCOUNTER — Telehealth: Payer: Self-pay | Admitting: *Deleted

## 2014-11-03 ENCOUNTER — Telehealth: Payer: Self-pay | Admitting: Endocrinology

## 2014-11-03 NOTE — Telephone Encounter (Signed)
Patients daughter called about her mom's sugars,  The lowest they have been is 167, and has been over 300 a couple of times,   Yesterday was 267.  They are giving 1/2 a tablet Glipizide every day since her sugar has been high, she's been giving it in the evening.

## 2014-11-03 NOTE — Telephone Encounter (Signed)
Pt daughter [name] calling back to speak about pt

## 2014-11-03 NOTE — Telephone Encounter (Signed)
What time of the day is the blood sugar high?  Need readings for 3 days

## 2014-11-03 NOTE — Telephone Encounter (Signed)
Patient daughter is calling to speak to [facility]

## 2014-11-04 ENCOUNTER — Ambulatory Visit (INDEPENDENT_AMBULATORY_CARE_PROVIDER_SITE_OTHER): Payer: Medicare Other | Admitting: Pharmacist Clinician (PhC)/ Clinical Pharmacy Specialist

## 2014-11-04 ENCOUNTER — Other Ambulatory Visit: Payer: Self-pay | Admitting: *Deleted

## 2014-11-04 DIAGNOSIS — I25119 Atherosclerotic heart disease of native coronary artery with unspecified angina pectoris: Secondary | ICD-10-CM

## 2014-11-04 DIAGNOSIS — I442 Atrioventricular block, complete: Secondary | ICD-10-CM

## 2014-11-04 DIAGNOSIS — Z7901 Long term (current) use of anticoagulants: Secondary | ICD-10-CM

## 2014-11-04 DIAGNOSIS — I824Z9 Acute embolism and thrombosis of unspecified deep veins of unspecified distal lower extremity: Secondary | ICD-10-CM

## 2014-11-04 LAB — POCT INR: INR: 1.2

## 2014-11-04 MED ORDER — RANOLAZINE ER 500 MG PO TB12: 500.0000 mg | ORAL_TABLET | Freq: Two times a day (BID) | ORAL | Status: DC

## 2014-11-04 NOTE — Telephone Encounter (Signed)
She can start taking half tablet of glipizide before each meal and call if blood sugars are still staying over 250

## 2014-11-04 NOTE — Telephone Encounter (Signed)
Noted, daughter is aware

## 2014-11-10 ENCOUNTER — Telehealth: Payer: Self-pay | Admitting: Cardiovascular Disease

## 2014-11-10 LAB — POCT INR: INR: 1.2

## 2014-11-10 NOTE — Telephone Encounter (Signed)
Patient calling the office for samples of medication:   1.  What medication and dosage are you requesting samples for? [name]   2.  Are you currently out of this medication? Yes   3. Are you requesting samples to get you through until you receive your prescription? No

## 2014-11-10 NOTE — Telephone Encounter (Signed)
Returned call to patient ranexa 500 mg samples left at front desk of [facility] office.

## 2014-11-11 ENCOUNTER — Ambulatory Visit (INDEPENDENT_AMBULATORY_CARE_PROVIDER_SITE_OTHER): Payer: Medicare Other | Admitting: Pharmacist Clinician (PhC)/ Clinical Pharmacy Specialist

## 2014-11-11 DIAGNOSIS — I824Z9 Acute embolism and thrombosis of unspecified deep veins of unspecified distal lower extremity: Secondary | ICD-10-CM

## 2014-11-11 DIAGNOSIS — Z7901 Long term (current) use of anticoagulants: Secondary | ICD-10-CM

## 2014-11-16 ENCOUNTER — Ambulatory Visit (INDEPENDENT_AMBULATORY_CARE_PROVIDER_SITE_OTHER): Payer: Medicare Other | Admitting: *Deleted

## 2014-11-16 DIAGNOSIS — I442 Atrioventricular block, complete: Secondary | ICD-10-CM | POA: Diagnosis not present

## 2014-11-18 NOTE — Progress Notes (Signed)
Remote pacemaker transmission.   

## 2014-11-19 ENCOUNTER — Telehealth: Payer: Self-pay | Admitting: Physician Assistant

## 2014-11-19 NOTE — Telephone Encounter (Signed)
Pt of Dr Sallyanne Kuster. Recently diagnosed with ductal breast CA. Grandson feels strongly that pt would benefit from blocking electromagnetic radiation of her PPM to the breast tissue. He has a device that he wishes her to put on for this purpose.  Spoke with Dr Sallyanne Kuster. He notes that she is PPM dependent and we will do nothing that will interfere with device function. He suggests that the patient bring the device with her to her next appointment and he will take a look at it.   I called Mr Quentin Cornwall back and strongly emphasized the need to do nothing with the device until/unless her MD agrees to this. I sent a staff message for her to get an appointment with him. He understands and agrees to the plan.  Lenoard Aden 11/19/2014 7:38 PM Beeper [phone]

## 2014-11-20 ENCOUNTER — Encounter: Payer: Self-pay | Admitting: Cardiology

## 2014-11-20 ENCOUNTER — Telehealth: Payer: Self-pay | Admitting: Cardiovascular Disease

## 2014-11-20 LAB — CUP PACEART REMOTE DEVICE CHECK
Brady Statistic AS VS Percent: 1 %
Implantable Lead Implant Date: 20080613
Implantable Lead Implant Date: 20080613
Implantable Lead Location: 753860
Implantable Lead Model: 5076
Lead Channel Impedance Value: 542 Ohm
Lead Channel Impedance Value: 653 Ohm
Lead Channel Pacing Threshold Pulse Width: 0.4 ms
Lead Channel Sensing Intrinsic Amplitude: 1.4 mV
Lead Channel Setting Pacing Amplitude: 2 V
MDC IDC LEAD LOCATION: 753859
MDC IDC MSMT BATTERY IMPEDANCE: 4087 Ohm
MDC IDC MSMT BATTERY REMAINING LONGEVITY: 10 mo
MDC IDC MSMT BATTERY VOLTAGE: 2.68 V
MDC IDC MSMT LEADCHNL RA PACING THRESHOLD AMPLITUDE: 1 V
MDC IDC MSMT LEADCHNL RV PACING THRESHOLD AMPLITUDE: 0.75 V
MDC IDC MSMT LEADCHNL RV PACING THRESHOLD PULSEWIDTH: 0.4 ms
MDC IDC SESS DTM: 20161101223548
MDC IDC SET LEADCHNL RA PACING AMPLITUDE: 2 V
MDC IDC SET LEADCHNL RV PACING PULSEWIDTH: 0.4 ms
MDC IDC SET LEADCHNL RV SENSING SENSITIVITY: 5.6 mV
MDC IDC STAT BRADY AP VP PERCENT: 14 %
MDC IDC STAT BRADY AP VS PERCENT: 0 %
MDC IDC STAT BRADY AS VP PERCENT: 85 %

## 2014-11-20 NOTE — Telephone Encounter (Signed)
Error. Close encounter

## 2014-11-23 ENCOUNTER — Encounter: Payer: Self-pay | Admitting: Cardiovascular Disease

## 2014-11-23 ENCOUNTER — Encounter: Payer: Self-pay | Admitting: Cardiology

## 2014-12-01 ENCOUNTER — Ambulatory Visit (INDEPENDENT_AMBULATORY_CARE_PROVIDER_SITE_OTHER): Payer: Medicare Other | Admitting: Pharmacist Clinician (PhC)/ Clinical Pharmacy Specialist

## 2014-12-01 DIAGNOSIS — I824Z9 Acute embolism and thrombosis of unspecified deep veins of unspecified distal lower extremity: Secondary | ICD-10-CM

## 2014-12-01 DIAGNOSIS — Z7901 Long term (current) use of anticoagulants: Secondary | ICD-10-CM

## 2014-12-01 LAB — POCT INR: INR: 2

## 2014-12-04 ENCOUNTER — Other Ambulatory Visit: Payer: Self-pay | Admitting: Cardiovascular Disease

## 2014-12-07 ENCOUNTER — Encounter: Payer: Self-pay | Admitting: Cardiovascular Disease

## 2014-12-07 ENCOUNTER — Telehealth: Payer: Self-pay | Admitting: Cardiovascular Disease

## 2014-12-07 ENCOUNTER — Ambulatory Visit (INDEPENDENT_AMBULATORY_CARE_PROVIDER_SITE_OTHER): Payer: Medicare Other | Admitting: Cardiovascular Disease

## 2014-12-07 VITALS — BP 102/64 | HR 72 | Ht 64.0 in | Wt 172.0 lb

## 2014-12-07 DIAGNOSIS — Z950 Presence of cardiac pacemaker: Secondary | ICD-10-CM | POA: Diagnosis not present

## 2014-12-07 DIAGNOSIS — I5042 Chronic combined systolic (congestive) and diastolic (congestive) heart failure: Secondary | ICD-10-CM | POA: Diagnosis not present

## 2014-12-07 DIAGNOSIS — I25111 Atherosclerotic heart disease of native coronary artery with angina pectoris with documented spasm: Secondary | ICD-10-CM

## 2014-12-07 DIAGNOSIS — I480 Paroxysmal atrial fibrillation: Secondary | ICD-10-CM | POA: Diagnosis not present

## 2014-12-07 NOTE — Progress Notes (Signed)
Patient ID: [name], female   DOB: 1920-11-20, 82 y.o.   MRN: [id number]     Cardiology Office Note   Date:  12/09/2014   ID:  [name], DOB 11/28/1920, MRN [id number]  PCP:  Kevan Ny, MD  Cardiologist:   Sanda Klein, MD   Chief Complaint  Patient presents with  . 2 MONTH FOLLOW UP  . Shortness of Breath  . Dizziness      History of Present Illness: [name] is a 81 y.o. female who presents for second-degree AV block, pacemaker check, paroxysmal atrial fibrillation, coronary artery disease and chronic systolic and diastolic heart failure.  She denies any recent problems with angina pectoris and has not had much in the way of dyspnea, but remains extremely sedentary. She does not have lower extremity edema. She is receiving anti-estrogen for ductal carcinoma in situ of the left breast .  Remote pacemaker check on November 1 shows normal device function with 100% ventricular pacing. She should be considered pacemaker dependent. There is only 14% atrial pacing. No episodes of atrial fibrillation and no ventricular tachycardia has been recorded. Her device is a Medtronic Adapta dual-chamber pacemaker implanted in 2008. There has an estimated 10 months of remaining longevity.  She is therapeutically anticoagulated with warfarin and has not had any recent bleeding problems. She has previously had GI bleeding, but has a very high embolic risk (CHADSVasc 7).  Dry weight was estimated to be around 280 pounds, she is well underneath this level today.  Past Medical History  Diagnosis Date  . Hypertension   . Coronary artery disease     a. 1994 s/p cabg;  b. 09/2013 MV: large, sev intensity, partially reversible inf, apical defect, prior inf/ap infarct w/ mild peri-infarct ischemia->Med Rx.  . Legally blind   . CKD (chronic kidney disease), stage III     a. iii - iv.  . Anemia   . Hypothyroid   . Gastric ulcer   . Hiatal hernia   . Dyspepsia   . UTI  (lower urinary tract infection)   . H/O: GI bleed 12//13  . High cholesterol   . Chronic combined systolic and diastolic CHF (congestive heart failure) ([place])     a. 01/2014 Echo: EF @ least mod-sev reduced with HK of lat/apical, basal inf walls. basalpost AK, Gr 1DD.  Marland Kitchen DVT of upper extremity (deep vein thrombosis) ([place]) 06/13/2012    BUE  . Seasonal allergies   . Allergy to perfume   . Type II diabetes mellitus ([place])   . History of blood transfusion 2013  . GERD (gastroesophageal reflux disease)   . Daily headache   . Arthritis   . PAF (paroxysmal atrial fibrillation) ([facility])     a. CHA2DS2VASc = 7-->coumadin.  . 2Nd degree atrioventricular block     a. s/p MDT ADDR01 DC PPM (ser #: [id number]).    Past Surgical History  Procedure Laterality Date  . Esophagogastroduodenoscopy  12/22/2011    Procedure: ESOPHAGOGASTRODUODENOSCOPY (EGD);  Surgeon: Gatha Mayer, MD;  Location: [facility];  Service: Endoscopy;  Laterality: N/A;  . Cardioversion  06/06/06    successful  . Insert / replace / remove pacemaker  06/29/2006    Medtronic adapta  . Cardiac catheterization  10/25/92  . Cardiac catheterization  11/18/03    w/grafts 100%CX LAD 80 & 100%  . Cardiac catheterization  01/24/05    diffuse disease of native vessels  . Cardiac catheterization  06/06/06    severe  native CAD  . Coronary artery bypass graft  10/27/92    LIMA to LAD,SVG to LAD second diagonal,obtuse maraginal of the CX and posterior descendingbranch of the RCA  . Cataract extraction w/ intraocular lens  implant, bilateral Bilateral   . Refractive surgery Bilateral      Current Outpatient Prescriptions  Medication Sig Dispense Refill  . anastrozole (ARIMIDEX) 1 MG tablet Take 1 tablet (1 mg total) by mouth daily. 90 tablet 3  . aspirin 81 MG chewable tablet Chew 81 mg by mouth daily.    Marland Kitchen atropine 1 % ophthalmic solution Place 1 drop into the right eye 4 (four) times daily.   0  . donepezil (ARICEPT) 5 MG tablet Take 5  mg by mouth at bedtime.   0  . feeding supplement, GLUCERNA SHAKE, (GLUCERNA SHAKE) LIQD Take 237 mLs by mouth daily.     . folic acid (FOLVITE) 1 MG tablet Take 1 tablet (1 mg total) by mouth daily. 90 tablet 3  . furosemide (LASIX) 40 MG tablet Take 1 tablet by mouth  daily as needed for edema 90 tablet 3  . gabapentin (NEURONTIN) 100 MG capsule Take 100 mg by mouth 2 (two) times daily.     Marland Kitchen glipiZIDE (GLUCOTROL) 5 MG tablet Take one-half tablet by  mouth daily only if blood  sugar is over 200 45 tablet 1  . glucose blood (ACCU-CHEK AVIVA) test strip Use as instructed to check blood sugar 5 times daily dx code E11.9 150 each 3  . imipenem-cilastatin 250 mg in sodium chloride 0.9 % 100 mL Inject 250 mg into the vein every 6 (six) hours. Stop after last dose on 08/31/14 39 vial 0  . levothyroxine (SYNTHROID) 50 MCG tablet take 1 tablet by mouth once daily BEFORE BREAKFAST 90 tablet 1  . linagliptin (TRADJENTA) 5 MG TABS tablet Take 1 tablet (5 mg total) by mouth daily. 30 tablet 1  . loratadine (CLARITIN) 10 MG tablet Take 10 mg by mouth daily.    . meclizine (ANTIVERT) 25 MG tablet Take 25 mg by mouth 3 (three) times daily as needed for dizziness.    . nitroGLYCERIN (NITRODUR - DOSED IN MG/24 HR) 0.2 mg/hr patch Place 1 patch (0.2 mg total) onto the skin daily. 30 patch 6  . omeprazole (PRILOSEC) 20 MG capsule Take 1 capsule by mouth  daily 90 capsule 2  . Polyvinyl Alcohol-Povidone (REFRESH OP) Place 1 drop into both eyes daily as needed (dry eyes).     . potassium chloride (K-DUR,KLOR-CON) 10 MEQ tablet Take 1 tablet by mouth two  times daily 180 tablet 3  . ranolazine ([name]) 500 MG 12 hr tablet Take 1 tablet (500 mg total) by mouth 2 (two) times daily. 180 tablet 1  . rosuvastatin (CRESTOR) 10 MG tablet Take 1 tablet (10 mg total) by mouth 3 (three) times a week. [id number] Exp: 8-18 (Patient taking differently: Take 10 mg by mouth every Monday, Wednesday, and Friday. ) 21 tablet 0  .  traMADol (ULTRAM) 50 MG tablet Take 1 tablet (50 mg total) by mouth every 6 (six) hours as needed for moderate pain. 30 tablet 0  . Vitamin D, Ergocalciferol, (DRISDOL) 50000 UNITS CAPS capsule Take 1,000 Units by mouth daily.     Marland Kitchen warfarin (COUMADIN) 3 MG tablet Take 1 tablet by mouth daily or as directed by coumadin clinic 30 tablet 1  . zolpidem (AMBIEN) 5 MG tablet Take 0.5 tablets (2.5 mg total) by mouth at bedtime. sleep  30 tablet 0   No current facility-administered medications for this visit.    Allergies:   Darvon; Digoxin and related; Penicillins; Percocet; Percodan; and Vicodin    Social History:  The patient  reports that she has never smoked. She has never used smokeless tobacco. She reports that she does not drink alcohol or use illicit drugs.   Family History:  The patient's family history includes Breast cancer in her daughter; Colon polyps in her daughter; Diabetes in her brother, daughter, father, and son; Heart attack in her brother; Heart disease in her father; Stroke in her sister.    ROS:  Please see the history of present illness.    Otherwise, review of systems positive for musculoskeletal pain in left shoulder, near blindness.   All other systems are reviewed and negative.    PHYSICAL EXAM: VS:  BP 102/64 mmHg  Pulse 72  Ht 5\' 4"  (1.626 m)  Wt 172 lb (78.019 kg)  BMI 29.51 kg/m2 , BMI Body mass index is 29.51 kg/(m^2).  General: Alert, oriented x3, no distress Head: no evidence of trauma, PERRL, EOMI, no exophtalmos or lid lag, no myxedema, no xanthelasma; normal ears, nose and oropharynx Neck: normal jugular venous pulsations and no hepatojugular reflux; brisk carotid pulses without delay and no carotid bruits Chest: clear to auscultation, no signs of consolidation by percussion or palpation, normal fremitus, symmetrical and full respiratory excursions Cardiovascular: normal position and quality of the apical impulse, regular rhythm, normal first and  paradoxically split second heart sounds, no murmurs, rubs or gallops Abdomen: no tenderness or distention, no masses by palpation, no abnormal pulsatility or arterial bruits, normal bowel sounds, no hepatosplenomegaly Extremities: no clubbing, cyanosis or edema; 2+ radial, ulnar and brachial pulses bilaterally; 2+ right femoral, posterior tibial and dorsalis pedis pulses; 2+ left femoral, posterior tibial and dorsalis pedis pulses; no subclavian or femoral bruits Neurological: grossly nonfocal Psych: euthymic mood, full affect   EKG:  EKG is not ordered today. The ekg ordered today demonstrates    Recent Labs: 07/21/2014: B Natriuretic Peptide 261.6* 08/19/2014: Magnesium 2.0; TSH 3.116 08/20/2014: ALT 66*; Hemoglobin 11.7*; Platelets 173 08/22/2014: BUN 9; Creatinine, Ser 1.00; Potassium 4.5; Sodium 136    Lipid Panel    Component Value Date/Time   CHOL 170 10/24/2013 1006   TRIG 201.0* 10/24/2013 1006   HDL 30.50* 10/24/2013 1006   CHOLHDL 6 10/24/2013 1006   VLDL 40.2* 10/24/2013 1006   LDLCALC 61 06/24/2013 1127   LDLDIRECT 92.3 10/24/2013 1006      Wt Readings from Last 3 Encounters:  12/07/14 172 lb (78.019 kg)  10/05/14 172 lb (78.019 kg)  09/17/14 175 lb 9.6 oz (79.652 kg)      Other studies Reviewed:  ASSESSMENT AND PLAN:  1. Chronic combined systolic and diastolic heart failure, as far as I could tell currently euvolemic, very sedentary lifestyle precludes assessment of true functional status. She is "below her dry weight", which we have estimated to be around 180 pounds.   2. Hypotension, borderline today, no new events since stopping the beta blocker altogether.  3. Coronary artery disease status post bypass surgery over 20 years ago with ischemic cardiomyopathy and known reversible ischemia. The only antianginal she can tolerate at this time is [name] due to her low blood pressure. Continue aspirin and rosuvastatin.  4. Asymptomatic paroxysmal atrial fibrillation  - Continue warfarin anticoagulation since she has had both venous thromboembolic events and atrial fibrillation, but the threshold for discontinuation is low in view  of previous problems with GI bleeding. Keep INR 2.0-2.5. Consider continuing aspirin alone if bleeding occurs  5. Normal dual-chamber permanent pacemaker function  with 14% atrial pacing and 99% ventricular pacing due to high-grade second-degree AV block and intermittent third-degree heart block. Estimated generator longevity is estimated at about 10 months. Ventricular pacing is still virtually 100% even after stopping beta blocker. She appears to be pacemaker dependent and will increase the frequency of battery checks to monthly.  6. Type 2 diabetes mellitus - recent hemoglobin A1c 6.1%  7. Chronic kidney disease stage III - recent creatinine 1.0  8. Left breast ductal carcinoma in situ, on medical/antiestrogen therapy  9. Overweight    Current medicines are reviewed at length with the patient today.  The patient does not have concerns regarding medicines.  The following changes have been made:  no change  Labs/ tests ordered today include:  No orders of the defined types were placed in this encounter.      Patient Instructions  Remote monitoring is used to monitor your Pacemaker from home. This monitoring reduces the number of office visits required to check your device to one time per year. It allows [place] to monitor the functioning of your device to ensure it is working properly. You are scheduled for a device check from home on January 07, 2015. You may send your transmission at any time that day. If you have a wireless device, the transmission will be sent automatically. After your physician reviews your transmission, you will receive a postcard with your next transmission date.  DO A REMOTE TRANSMISSION FROM HOME EVERY MONTH.  Dr. Sallyanne Kuster recommends that you schedule a follow-up appointment in: [place], Kiril Hippe, MD  12/09/2014 10:19 AM    Sanda Klein, MD, [facility] [phone] office [phone] pager

## 2014-12-07 NOTE — Patient Instructions (Signed)
Remote monitoring is used to monitor your Pacemaker from home. This monitoring reduces the number of office visits required to check your device to one time per year. It allows [place] to monitor the functioning of your device to ensure it is working properly. You are scheduled for a device check from home on January 07, 2015. You may send your transmission at any time that day. If you have a wireless device, the transmission will be sent automatically. After your physician reviews your transmission, you will receive a postcard with your next transmission date.  DO A REMOTE TRANSMISSION FROM HOME EVERY MONTH.  Dr. Sallyanne Kuster recommends that you schedule a follow-up appointment in: 6 MONTHS

## 2014-12-08 NOTE — Telephone Encounter (Signed)
Closed encounter °

## 2014-12-12 ENCOUNTER — Other Ambulatory Visit: Payer: Self-pay | Admitting: Endocrinology

## 2014-12-15 ENCOUNTER — Ambulatory Visit (INDEPENDENT_AMBULATORY_CARE_PROVIDER_SITE_OTHER): Payer: Medicare Other | Admitting: Pharmacist Clinician (PhC)/ Clinical Pharmacy Specialist

## 2014-12-15 ENCOUNTER — Telehealth: Payer: Self-pay

## 2014-12-15 DIAGNOSIS — Z7901 Long term (current) use of anticoagulants: Secondary | ICD-10-CM

## 2014-12-15 DIAGNOSIS — I824Z9 Acute embolism and thrombosis of unspecified deep veins of unspecified distal lower extremity: Secondary | ICD-10-CM

## 2014-12-15 LAB — POCT INR: INR: 2.1

## 2014-12-15 NOTE — Telephone Encounter (Signed)
mammgram order faxed to [facility].  Sent to scan.

## 2014-12-15 NOTE — Telephone Encounter (Signed)
[name] called to clarify the reason for the 12/8 appt. Per Dr Geralyn Flash note it is to evaluate response to anastrazole. Asked [name] to call [facility] and get a mammogram appt for before the 8th. The order is in epic.

## 2014-12-19 ENCOUNTER — Other Ambulatory Visit: Payer: Self-pay | Admitting: Cardiology

## 2014-12-21 ENCOUNTER — Telehealth: Payer: Self-pay | Admitting: Cardiovascular Disease

## 2014-12-21 MED ORDER — NITROGLYCERIN 0.2 MG/HR TD PT24: 0.2000 mg | MEDICATED_PATCH | Freq: Every day | TRANSDERMAL | Status: DC

## 2014-12-21 NOTE — Telephone Encounter (Signed)
Patient has questions regarding her furosemide.

## 2014-12-21 NOTE — Telephone Encounter (Signed)
NTG patch refilled electronically.

## 2014-12-21 NOTE — Telephone Encounter (Signed)
Dose questions answered about furosemide. Reviewed w daughter, pt is taking furosemide twice daily, order was changed to once daily PRN on Rx but i do not see documentation reflecting the reason for this change. They report pt never got instructions to reduce dose of furosemide.  Advised to continue twice daily furosemide 40mg  & twice daily potassium 10 meq. Will route to Dr. Sallyanne Kuster for any further recommendations.

## 2014-12-21 NOTE — Telephone Encounter (Signed)
°*  STAT* If patient is at the pharmacy, call can be transferred to refill team.   1. Which medications need to be refilled? (please list name of each medication and dose if known) Nitrostat patch   2. Which pharmacy/location (including street and city if local pharmacy) is medication to be sent to? [facility] patch   3. Do they need a 30 day or 90 day supply? [place]

## 2014-12-21 NOTE — Telephone Encounter (Signed)
Understood. Agree, should remain on current dose of diuretic and potassium

## 2014-12-24 ENCOUNTER — Encounter: Payer: Self-pay | Admitting: Hematology and Oncology

## 2014-12-24 ENCOUNTER — Telehealth: Payer: Self-pay | Admitting: Hematology and Oncology

## 2014-12-24 ENCOUNTER — Ambulatory Visit (HOSPITAL_BASED_OUTPATIENT_CLINIC_OR_DEPARTMENT_OTHER): Payer: Medicare Other | Admitting: Hematology and Oncology

## 2014-12-24 VITALS — BP 113/64 | HR 74 | Temp 98.1°F | Resp 18 | Ht 64.0 in | Wt 179.1 lb

## 2014-12-24 DIAGNOSIS — D0512 Intraductal carcinoma in situ of left breast: Secondary | ICD-10-CM

## 2014-12-24 DIAGNOSIS — C50112 Malignant neoplasm of central portion of left female breast: Secondary | ICD-10-CM

## 2014-12-24 NOTE — Addendum Note (Signed)
Addended by: Prentiss Bells on: 12/24/2014 08:20 PM   Modules accepted: Medications

## 2014-12-24 NOTE — Assessment & Plan Note (Signed)
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 Pathology and radiology review: I discussed with the patient and her family the difference between DCIS and invasive breast cancer. I also discussed the significance of ER and PR receptors and the implications of treatment.  Recommendation:Patient is not a candidate for surgery because of her CHF/pacemaker Current treatment: Anastrozole 1 mg daily started June 2016 but she did not take it for a month because of hospitalizations for UTI and sepsis. Resumed anastrozole 1 mg every other day starting 09/17/2014  Reason for using anastrozole every other day is because of her nephew who has been doing tremendous amount of research into hormonal therapies and how they can affect and change internal metabolisms especially phosphatidylcholine and he would like her not to take higher dose of aromatase inhibitor. He also attributes the aromatase inhibitor to be responsible for her urinary tract infection.  Return to clinic in 6 months for follow-up

## 2014-12-24 NOTE — Telephone Encounter (Signed)
Appointments made and avs has been printed for patient

## 2014-12-24 NOTE — Progress Notes (Signed)
Patient Care Team: [name], MD as PCP - General (Internal Medicine)  DIAGNOSIS: No matching staging information was found for the patient.  SUMMARY OF ONCOLOGIC HISTORY:   Cancer of central portion of left female breast ([place])   06/30/2014 Initial Diagnosis Left breast biopsy: DCIS ER 100%, PR 40%, 8.6 cm area of grouped segmental calcifications   07/30/2014 -  Anti-estrogen oral therapy anastrozole 1 mg every other day palliative treatment    CHIEF COMPLIANT: tolerating anastrozole extremely well  INTERVAL HISTORY: [name] is a 81 year old lady with above-mentioned history of left breast DCIS currently on hormone therapy with anastrozole as a palliative treatment. We have previously discussed with her about any treatment being optional given her advanced age. She elected to stay on this medicine. A step on her family member who lives in [place] who requested that she take it only 3 times a week, we elected to give her only for 3 times a week. She does not have any major problems taking it.  REVIEW OF SYSTEMS:   Constitutional: Denies fevers, chills or abnormal weight loss Eyes: decreased vision Ears, nose, mouth, throat, and face: diminished hearing Respiratory: Denies cough, dyspnea or wheezes Cardiovascular: Denies palpitation, chest discomfort or lower extremity swelling Gastrointestinal:  Denies nausea, heartburn or change in bowel habits Skin: Denies abnormal skin rashes Lymphatics: Denies new lymphadenopathy or easy bruising Neurological:generalized weakness Behavioral/Psych: Mood is stable, no new changes  Breast:  denies any pain or lumps or nodules in either breasts All other systems were reviewed with the patient and are negative.  I have reviewed the past medical history, past surgical history, social history and family history with the patient and they are unchanged from previous note.  ALLERGIES:  is allergic to darvon; digoxin and related;  penicillins; percocet; percodan; and vicodin.  MEDICATIONS:  Current Outpatient Prescriptions  Medication Sig Dispense Refill  . anastrozole (ARIMIDEX) 1 MG tablet Take 1 tablet (1 mg total) by mouth daily. 90 tablet 3  . aspirin 81 MG chewable tablet Chew 81 mg by mouth daily.    Marland Kitchen atropine 1 % ophthalmic solution Place 1 drop into the right eye 4 (four) times daily.   0  . donepezil (ARICEPT) 5 MG tablet Take 5 mg by mouth at bedtime.   0  . feeding supplement, GLUCERNA SHAKE, (GLUCERNA SHAKE) LIQD Take 237 mLs by mouth daily.     . folic acid (FOLVITE) 1 MG tablet Take 1 tablet (1 mg total) by mouth daily. 90 tablet 3  . furosemide (LASIX) 40 MG tablet Take 1 tablet by mouth  daily as needed for edema 90 tablet 3  . gabapentin (NEURONTIN) 100 MG capsule Take 100 mg by mouth 2 (two) times daily.     Marland Kitchen glipiZIDE (GLUCOTROL) 5 MG tablet TAKE ONE-HALF TABLET BY  MOUTH DAILY ONLY IF BLOOD  SUGAR IS OVER 200 45 tablet 1  . glucose blood (ACCU-CHEK AVIVA) test strip Use as instructed to check blood sugar 5 times daily dx code E11.9 150 each 3  . imipenem-cilastatin 250 mg in sodium chloride 0.9 % 100 mL Inject 250 mg into the vein every 6 (six) hours. Stop after last dose on 08/31/14 39 vial 0  . levothyroxine (SYNTHROID) 50 MCG tablet take 1 tablet by mouth once daily BEFORE BREAKFAST 90 tablet 1  . linagliptin (TRADJENTA) 5 MG TABS tablet Take 1 tablet (5 mg total) by mouth daily. 30 tablet 1  . loratadine (CLARITIN) 10 MG tablet  Take 10 mg by mouth daily.    . meclizine (ANTIVERT) 25 MG tablet Take 25 mg by mouth 3 (three) times daily as needed for dizziness.    . nitroGLYCERIN (NITRODUR - DOSED IN MG/24 HR) 0.2 mg/hr patch Place 1 patch (0.2 mg total) onto the skin daily. 30 patch 6  . omeprazole (PRILOSEC) 20 MG capsule Take 1 capsule by mouth  daily 90 capsule 2  . Polyvinyl Alcohol-Povidone (REFRESH OP) Place 1 drop into both eyes daily as needed (dry eyes).     . potassium chloride  (K-DUR,KLOR-CON) 10 MEQ tablet Take 1 tablet by mouth two  times daily 180 tablet 3  . ranolazine ([name]) 500 MG 12 hr tablet Take 1 tablet (500 mg total) by mouth 2 (two) times daily. 180 tablet 1  . rosuvastatin (CRESTOR) 10 MG tablet Take 1 tablet (10 mg total) by mouth 3 (three) times a week. [id number] Exp: 8-18 (Patient taking differently: Take 10 mg by mouth every Monday, Wednesday, and Friday. ) 21 tablet 0  . traMADol (ULTRAM) 50 MG tablet Take 1 tablet (50 mg total) by mouth every 6 (six) hours as needed for moderate pain. 30 tablet 0  . Vitamin D, Ergocalciferol, (DRISDOL) 50000 UNITS CAPS capsule Take 1,000 Units by mouth daily.     Marland Kitchen warfarin (COUMADIN) 3 MG tablet Take 1 tablet by mouth daily or as directed by coumadin clinic 30 tablet 1  . zolpidem (AMBIEN) 5 MG tablet Take 0.5 tablets (2.5 mg total) by mouth at bedtime. sleep 30 tablet 0   No current facility-administered medications for this visit.    PHYSICAL EXAMINATION: ECOG PERFORMANCE STATUS: 2 - Symptomatic, <50% confined to bed  Filed Vitals:   12/24/14 1359  BP: 113/64  Pulse: 74  Temp: 98.1 F (36.7 C)  Resp: 18   Filed Weights   12/24/14 1359  Weight: 179 lb 1.6 oz (81.239 kg)    GENERAL:alert, no distress and comfortable SKIN: skin color, texture, turgor are normal, no rashes or significant lesions EYES: normal, Conjunctiva are pink and non-injected, sclera clear OROPHARYNX:no exudate, no erythema and lips, buccal mucosa, and tongue normal  NECK: supple, thyroid normal size, non-tender, without nodularity LYMPH:  no palpable lymphadenopathy in the cervical, axillary or inguinal LUNGS: clear to auscultation and percussion with normal breathing effort HEART: regular rate & rhythm and no murmurs and no lower extremity edema ABDOMEN:abdomen soft, non-tender and normal bowel sounds Musculoskeletal:no cyanosis of digits and no clubbing  NEURO: alert & oriented x 3 with fluent speech,  wheelchair-bound   LABORATORY DATA:  I have reviewed the data as listed   Chemistry      Component Value Date/Time   NA 136 08/22/2014 0505   K 4.5 08/22/2014 0505   CL 108 08/22/2014 0505   CO2 24 08/22/2014 0505   BUN 9 08/22/2014 0505   CREATININE 1.00 08/22/2014 0505   CREATININE 1.33* 02/12/2014 1535      Component Value Date/Time   CALCIUM 8.6* 08/22/2014 0505   ALKPHOS 104 08/20/2014 0553   AST 48* 08/20/2014 0553   ALT 66* 08/20/2014 0553   BILITOT 0.6 08/20/2014 0553       Lab Results  Component Value Date   WBC 5.0 08/20/2014   HGB 11.7* 08/20/2014   HCT 35.7* 08/20/2014   MCV 87.7 08/20/2014   PLT 173 08/20/2014   NEUTROABS 1.9 08/19/2014   ASSESSMENT & PLAN:  Cancer of central portion of left female breast ([facility]) DCIS left breast  ER 100% PR 40%, 8.6 area of calcifications status post biopsy on 06/30/2014. Pathology and radiology review: I discussed with the patient and her family the difference between DCIS and invasive breast cancer. I also discussed the significance of ER and PR receptors and the implications of treatment.  Recommendation:Patient is not a candidate for surgery because of her CHF/pacemaker Current treatment: Anastrozole 1 mg daily started June 2016 but she did not take it for a month because of hospitalizations for UTI and sepsis. Resumed anastrozole 1 mg every other day starting 09/17/2014  Reason for using anastrozole every other day is because of her nephew who has been doing tremendous amount of research into hormonal therapies and how they can affect and change internal metabolisms especially phosphatidylcholine and he would like her not to take higher dose of aromatase inhibitor. He also attributes the aromatase inhibitor to be responsible for her urinary tract infection.  Return to clinic in 6 months for follow-up    No orders of the defined types were placed in this encounter.   The patient has a good understanding of the  overall plan. she agrees with it. she will call with any problems that may develop before the next visit here.   Rulon Eisenmenger, MD 12/24/2014

## 2014-12-30 ENCOUNTER — Ambulatory Visit (INDEPENDENT_AMBULATORY_CARE_PROVIDER_SITE_OTHER): Payer: Medicare Other | Admitting: Pharmacist Clinician (PhC)/ Clinical Pharmacy Specialist

## 2014-12-30 ENCOUNTER — Telehealth: Payer: Self-pay | Admitting: Cardiovascular Disease

## 2014-12-30 DIAGNOSIS — Z7901 Long term (current) use of anticoagulants: Secondary | ICD-10-CM

## 2014-12-30 DIAGNOSIS — I824Z9 Acute embolism and thrombosis of unspecified deep veins of unspecified distal lower extremity: Secondary | ICD-10-CM

## 2014-12-30 LAB — POCT INR: INR: 2

## 2014-12-30 NOTE — Telephone Encounter (Signed)
No mention of any refills needed.

## 2014-12-30 NOTE — Telephone Encounter (Signed)
Does she need a remote check for December?

## 2014-12-30 NOTE — Telephone Encounter (Signed)
LM informing Jenny Reichmann (son) that a remote is not needed this month and that they will receive a letter with the next transmission date. John voiced understanding.

## 2015-01-01 ENCOUNTER — Other Ambulatory Visit: Payer: Self-pay | Admitting: *Deleted

## 2015-01-01 ENCOUNTER — Telehealth: Payer: Self-pay | Admitting: Cardiovascular Disease

## 2015-01-01 ENCOUNTER — Encounter: Payer: Self-pay | Admitting: Hematology and Oncology

## 2015-01-01 MED ORDER — WARFARIN SODIUM 3 MG PO TABS: ORAL_TABLET | ORAL | Status: DC

## 2015-01-01 NOTE — Telephone Encounter (Signed)
Warfarin sent to pharmacy.

## 2015-01-01 NOTE — Telephone Encounter (Signed)
Patient calling the office for samples of medication:   1.  What medication and dosage are you requesting samples for? [name] 500mg   2.  Are you currently out of this medication? Yes    *STAT* If patient is at the pharmacy, call can be transferred to refill team.   1. Which medications need to be refilled? (please list name of each medication and dose if known) Warfarin   2. Which pharmacy/location (including street and city if local pharmacy) is medication to be sent to?[facility] on [address]  3. Do they need a 30 day or 90 day supply? [place]

## 2015-01-13 ENCOUNTER — Telehealth: Payer: Self-pay | Admitting: Cardiovascular Disease

## 2015-01-13 ENCOUNTER — Telehealth: Payer: Self-pay | Admitting: Gastroenterology

## 2015-01-13 NOTE — Telephone Encounter (Signed)
Pt stated that medication is to expensive for her to purchase. Samples placed at front desk she said she will pick up tomorrow.  Pt requested if there is another mediation she can try that is not as expensive for her, told her I would forward to Dr. Loletha Grayer to review and for suggestions.

## 2015-01-13 NOTE — Telephone Encounter (Signed)
Patient calling the office for samples of medication:   1.  What medication and dosage are you requesting samples for? [name]   2.  Are you currently out of this medication? Yes

## 2015-01-14 ENCOUNTER — Ambulatory Visit (INDEPENDENT_AMBULATORY_CARE_PROVIDER_SITE_OTHER): Payer: Medicare Other | Admitting: Endocrinology

## 2015-01-14 ENCOUNTER — Encounter: Payer: Self-pay | Admitting: Endocrinology

## 2015-01-14 VITALS — BP 118/68 | HR 70 | Temp 98.2°F | Resp 14 | Ht 64.0 in

## 2015-01-14 DIAGNOSIS — E038 Other specified hypothyroidism: Secondary | ICD-10-CM | POA: Diagnosis not present

## 2015-01-14 DIAGNOSIS — E119 Type 2 diabetes mellitus without complications: Secondary | ICD-10-CM

## 2015-01-14 DIAGNOSIS — E063 Autoimmune thyroiditis: Secondary | ICD-10-CM

## 2015-01-14 LAB — POCT GLYCOSYLATED HEMOGLOBIN (HGB A1C): HEMOGLOBIN A1C: 7.6

## 2015-01-14 NOTE — Progress Notes (Signed)
Patient ID: [name], female   DOB: 1920-03-16, 81 y.o.   MRN: [id number]   Reason for Appointment: Diabetes follow-up   History of Present Illness   Diagnosis: Type 2 diabetes mellitus, date of diagnosis: 1972.   She had been on insulin for several years and this had been tapered off in 2013 because of weight loss and lower blood sugars. Subsequently blood sugars had been generally reasonably good.  Because of her age and multiple medical problems she has been monitored without insulin  Recent history:  She has not been seen in follow-up for over 3 months Because of her age her medications have been reduced or stopped because of tendency to hypoglycemia She was recommended starting Tradjenta instead of low-dose glipizide because of hypoglycemia but this was too expensive for her. More recently has not taken any glipizide usually  Her family tends to check her blood sugars regularly before breakfast and supper and sometimes midday Recent GLUCOSE readings are near normal fasting with an average of 122 Blood sugars around suppertime are very inconsistent ranging from 136-327; however usually blood sugars are not over 250 Her daughter says that when blood sugars are high day glucose was checked on the other hand in the reading will be quite different  Monitors blood glucose: 2-3 times daily.  Glucometer: Accucheck Aviva.  Recent readings from download: As above  Meals: 2 meals at about noon and 7 pm; sometimes has lemonade or sprite. Has small portions, has some grapes for snacks in the afternoon  Dietician visit: Most recent:2001.     Wt Readings from Last 3 Encounters:  12/24/14 179 lb 1.6 oz (81.239 kg)  12/07/14 172 lb (78.019 kg)  10/05/14 172 lb (78.019 kg)   Lab Results  Component Value Date   HGBA1C 7.6 01/14/2015   HGBA1C 7.1 09/30/2014   HGBA1C 7.3* 05/25/2014   Lab Results  Component Value Date   LDLCALC 61 06/24/2013   CREATININE 1.00  08/22/2014       Medication List       This list is accurate as of: 01/14/15  9:33 PM.  Always use your most recent med list.               anastrozole 1 MG tablet  Commonly known as:  ARIMIDEX  Take 1 tablet (1 mg total) by mouth daily.     aspirin 81 MG chewable tablet  Chew 81 mg by mouth daily.     atropine 1 % ophthalmic solution  Place 1 drop into the right eye 4 (four) times daily.     donepezil 5 MG tablet  Commonly known as:  ARICEPT  Take 5 mg by mouth at bedtime.     feeding supplement (GLUCERNA SHAKE) Liqd  Take 237 mLs by mouth daily.     folic acid 1 MG tablet  Commonly known as:  FOLVITE  Take 1 tablet (1 mg total) by mouth daily.     furosemide 40 MG tablet  Commonly known as:  LASIX  Take 1 tablet by mouth  daily as needed for edema     gabapentin 100 MG capsule  Commonly known as:  NEURONTIN  Take 100 mg by mouth 2 (two) times daily.     glipiZIDE 5 MG tablet  Commonly known as:  GLUCOTROL  TAKE ONE-HALF TABLET BY  MOUTH DAILY ONLY IF BLOOD  SUGAR IS OVER 200     glucose blood test strip  Commonly known as:  ACCU-CHEK AVIVA  Use as instructed to check blood sugar 5 times daily dx code E11.9     imipenem-cilastatin 250 mg in sodium chloride 0.9 % 100 mL  Inject 250 mg into the vein every 6 (six) hours. Stop after last dose on 08/31/14     levothyroxine 50 MCG tablet  Commonly known as:  SYNTHROID  take 1 tablet by mouth once daily BEFORE BREAKFAST     linagliptin 5 MG Tabs tablet  Commonly known as:  TRADJENTA  Take 1 tablet (5 mg total) by mouth daily.     loratadine 10 MG tablet  Commonly known as:  CLARITIN  Take 10 mg by mouth daily.     meclizine 25 MG tablet  Commonly known as:  ANTIVERT  Take 25 mg by mouth 3 (three) times daily as needed for dizziness.     nitroGLYCERIN 0.2 mg/hr patch  Commonly known as:  NITRODUR - Dosed in mg/24 hr  Place 1 patch (0.2 mg total) onto the skin daily.     omeprazole 20 MG capsule    Commonly known as:  PRILOSEC  Take 1 capsule by mouth  daily     potassium chloride 10 MEQ tablet  Commonly known as:  K-DUR,KLOR-CON  Take 1 tablet by mouth two  times daily     ranolazine 500 MG 12 hr tablet  Commonly known as:  [name]  Take 1 tablet (500 mg total) by mouth 2 (two) times daily.     REFRESH OP  Place 1 drop into both eyes daily as needed (dry eyes).     rosuvastatin 10 MG tablet  Commonly known as:  CRESTOR  Take 1 tablet (10 mg total) by mouth 3 (three) times a week. [id number] Exp: 8-18     traMADol 50 MG tablet  Commonly known as:  ULTRAM  Take 1 tablet (50 mg total) by mouth every 6 (six) hours as needed for moderate pain.     Vitamin D (Ergocalciferol) 50000 units Caps capsule  Commonly known as:  DRISDOL  Take 1,000 Units by mouth daily.     warfarin 3 MG tablet  Commonly known as:  COUMADIN  Take 1 tablet by mouth daily or as directed by coumadin clinic     zolpidem 5 MG tablet  Commonly known as:  AMBIEN  Take 0.5 tablets (2.5 mg total) by mouth at bedtime. sleep        Past Medical History  Diagnosis Date  . Hypertension   . Coronary artery disease     a. 1994 s/p cabg;  b. 09/2013 MV: large, sev intensity, partially reversible inf, apical defect, prior inf/ap infarct w/ mild peri-infarct ischemia->Med Rx.  . Legally blind   . CKD (chronic kidney disease), stage III     a. iii - iv.  . Anemia   . Hypothyroid   . Gastric ulcer   . Hiatal hernia   . Dyspepsia   . UTI (lower urinary tract infection)   . H/O: GI bleed 12//13  . High cholesterol   . Chronic combined systolic and diastolic CHF (congestive heart failure) ([place])     a. 01/2014 Echo: EF @ least mod-sev reduced with HK of lat/apical, basal inf walls. basalpost AK, Gr 1DD.  Marland Kitchen DVT of upper extremity (deep vein thrombosis) ([place]) 06/13/2012    BUE  . Seasonal allergies   . Allergy to perfume   . Type II diabetes mellitus ([place])   . History of blood transfusion 2013  .  GERD  (gastroesophageal reflux disease)   . Daily headache   . Arthritis   . PAF (paroxysmal atrial fibrillation) ([facility])     a. CHA2DS2VASc = 7-->coumadin.  . 2Nd degree atrioventricular block     a. s/p MDT ADDR01 DC PPM (ser #: [id number]).    Past Surgical History  Procedure Laterality Date  . Esophagogastroduodenoscopy  12/22/2011    Procedure: ESOPHAGOGASTRODUODENOSCOPY (EGD);  Surgeon: Gatha Mayer, MD;  Location: [facility];  Service: Endoscopy;  Laterality: N/A;  . Cardioversion  06/06/06    successful  . Insert / replace / remove pacemaker  06/29/2006    Medtronic adapta  . Cardiac catheterization  10/25/92  . Cardiac catheterization  11/18/03    w/grafts 100%CX LAD 80 & 100%  . Cardiac catheterization  01/24/05    diffuse disease of native vessels  . Cardiac catheterization  06/06/06    severe native CAD  . Coronary artery bypass graft  10/27/92    LIMA to LAD,SVG to LAD second diagonal,obtuse maraginal of the CX and posterior descendingbranch of the RCA  . Cataract extraction w/ intraocular lens  implant, bilateral Bilateral   . Refractive surgery Bilateral     Family History  Problem Relation Age of Onset  . Breast cancer Daughter   . Diabetes Son   . Diabetes Daughter   . Heart disease Father   . Diabetes Father   . Colon polyps Daughter   . Heart attack Brother   . Diabetes Brother   . Stroke Sister     Social History:  reports that she has never smoked. She has never used smokeless tobacco. She reports that she does not drink alcohol or use illicit drugs.  Allergies:  Allergies  Allergen Reactions  . Darvon     Unknown reaction   . Digoxin And Related Nausea Only  . Penicillins Other (See Comments)    Was told had allergy from childhood... Unknown reaction  . Percocet [Oxycodone-Acetaminophen] Other (See Comments)    confused  . Percodan [Oxycodone-Aspirin]   . Vicodin [Hydrocodone-Acetaminophen] Other (See Comments)    confused    REVIEW of  systems:  Hypothyroidism: She is taking levothyroxine  50 mcg daily with extra half tablet twice a week since her last visit when her TSH was high TSH is normal  Lab Results  Component Value Date   TSH 3.116 08/19/2014   TSH 2.65 06/25/2014   TSH 4.519* 05/25/2014   FREET4 0.70 06/25/2014   FREET4 0.65 06/24/2013   FREET4 0.93 01/24/2013     She has had renal dysfunction which is long-standing    Lab Results  Component Value Date   CREATININE 1.00 08/22/2014    Has had periodic pedal edema. Does wear elastic stockings  She has significant neuropathy with sensory loss on previous  foot exam.     Examination:   BP 118/68 mmHg  Pulse 70  Temp(Src) 98.2 F (36.8 C)  Resp 14  Ht 5\' 4"  (1.626 m)  Wt   SpO2 95%  There is no weight on file to calculate BMI.    Assesment/plan:   1.  Diabetes type 2, with obesity, long-standing  Since she has had severe hypoglycemia she has been taken off her glipizide Now currently not on any medication She tends to have mild to moderate increase in blood sugars around suppertime even though she is not eating much in the middle of the day except for some fruits Blood sugars are also not consistently high  around midday Considering her age she will be continued to be observed with no medications since recent average blood sugar is 170 and her A1c is still under 8%  May consider Tradjenta or Januvia next year covered by her insurance Discussed with the family that she can be monitored once a day with some readings after breakfast and some before dinner  2.  Hypothyroidism: Will check labs on the next visit   [facility] 01/14/2015, 9:33 PM

## 2015-01-14 NOTE — Telephone Encounter (Signed)
There is no generic, no other equivalent medication. Will ask our pharmacists to help: I am sure she will qualify for assistance from the manufacturer. Thanks, [employer]

## 2015-01-14 NOTE — Patient Instructions (Signed)
Check sugar only at supper and some at 8 am or noon

## 2015-01-14 NOTE — Telephone Encounter (Signed)
Left message for patient to call back  

## 2015-01-19 LAB — PROTIME-INR: INR: 1.6 — AB (ref <=1.1)

## 2015-01-20 ENCOUNTER — Encounter: Payer: Self-pay | Admitting: Physician Assistant

## 2015-01-20 ENCOUNTER — Other Ambulatory Visit (INDEPENDENT_AMBULATORY_CARE_PROVIDER_SITE_OTHER): Payer: PPO

## 2015-01-20 ENCOUNTER — Ambulatory Visit (INDEPENDENT_AMBULATORY_CARE_PROVIDER_SITE_OTHER): Payer: PPO | Admitting: Physician Assistant

## 2015-01-20 VITALS — BP 88/56 | HR 68 | Ht 64.0 in | Wt 175.0 lb

## 2015-01-20 DIAGNOSIS — K5909 Other constipation: Secondary | ICD-10-CM | POA: Diagnosis not present

## 2015-01-20 DIAGNOSIS — K219 Gastro-esophageal reflux disease without esophagitis: Secondary | ICD-10-CM

## 2015-01-20 DIAGNOSIS — G8929 Other chronic pain: Secondary | ICD-10-CM

## 2015-01-20 DIAGNOSIS — R1013 Epigastric pain: Secondary | ICD-10-CM

## 2015-01-20 DIAGNOSIS — R1084 Generalized abdominal pain: Secondary | ICD-10-CM | POA: Diagnosis not present

## 2015-01-20 LAB — CBC WITH DIFFERENTIAL/PLATELET
BASOS ABS: 0.1 10*3/uL (ref 0.0–0.1)
BASOS PCT: 1.2 % (ref 0.0–3.0)
EOS ABS: 0.1 10*3/uL (ref 0.0–0.7)
Eosinophils Relative: 0.8 % (ref 0.0–5.0)
HEMATOCRIT: 37.1 % (ref 36.0–46.0)
HEMOGLOBIN: 12.1 g/dL (ref 12.0–15.0)
LYMPHS PCT: 30.7 % (ref 12.0–46.0)
Lymphs Abs: 2 10*3/uL (ref 0.7–4.0)
MCHC: 32.5 g/dL (ref 30.0–36.0)
MCV: 89.3 fl (ref 78.0–100.0)
MONO ABS: 0.3 10*3/uL (ref 0.1–1.0)
Monocytes Relative: 5 % (ref 3.0–12.0)
Neutro Abs: 4 10*3/uL (ref 1.4–7.7)
Neutrophils Relative %: 62.3 % (ref 43.0–77.0)
Platelets: 176 10*3/uL (ref 150.0–400.0)
RBC: 4.16 Mil/uL (ref 3.87–5.11)
RDW: 14.2 % (ref 11.5–15.5)
WBC: 6.4 10*3/uL (ref 4.0–10.5)

## 2015-01-20 LAB — COMPREHENSIVE METABOLIC PANEL
ALBUMIN: 3.4 g/dL — AB (ref 3.5–5.2)
ALT: 7 U/L (ref 0–35)
AST: 15 U/L (ref 0–37)
Alkaline Phosphatase: 84 U/L (ref 39–117)
BILIRUBIN TOTAL: 0.6 mg/dL (ref 0.2–1.2)
BUN: 24 mg/dL — AB (ref 6–23)
CALCIUM: 9.5 mg/dL (ref 8.4–10.5)
CHLORIDE: 103 meq/L (ref 96–112)
CO2: 31 meq/L (ref 19–32)
CREATININE: 1.55 mg/dL — AB (ref 0.40–1.20)
GFR: 40.17 mL/min — ABNORMAL LOW (ref >60.00)
Glucose, Bld: 161 mg/dL — ABNORMAL HIGH (ref 70–99)
Potassium: 5.1 mEq/L (ref 3.5–5.1)
SODIUM: 139 meq/L (ref 135–145)
Total Protein: 6.6 g/dL (ref 6.0–8.3)

## 2015-01-20 MED ORDER — OMEPRAZOLE 20 MG PO CPDR: 20.0000 mg | DELAYED_RELEASE_CAPSULE | Freq: Two times a day (BID) | ORAL | Status: DC

## 2015-01-20 NOTE — Progress Notes (Signed)
Patient ID: [name], female   DOB: [birth year], 82 y.o.   MRN: [id number]   Subjective:    Patient ID: [name], female    DOB: Sep 22, 1920, 82 y.o.   MRN: [id number]  HPI [name] is a 82 year old African-American female former patient of Dr. Kelby Torres who has history of chronic dyspeptic symptoms. Also has had a gastric ulcer which was diagnosed in December 2013 on EGD and also a small hiatal hernia. She has multiple comorbidities including adult-onset diabetes mellitus coronary artery disease atrial fibrillation for which she is on Coumadin, congestive heart failure with EF of 30-35% and is status post pacemaker. She was recently diagnosed with breast cancer of her left breast and is on Arimidex. Patient has been on Prilosec 20 mg by mouth every morning. She comes in today for routine follow-up. Her son says that she has ongoing GI complaints which she has had for years and he usually had seen Dr. Deatra Torres twice yearly. She complains of intermittent bloating some days worse than others. She has been eating without occult he and does not feel that eating exacerbates any of her discomfort. She does complain of some sour brash type symptoms no dysphagia or odynophagia. Very occasional nausea. She has had chronic long-term constipation which had been unresponsive to multiple laxatives and currently takes a bottle of mag citrate once weekly before bowel movement. Upper abdominal ultrasound had been done in January 2016 and was negative.  Review of Systems Pertinent positive and negative review of systems were noted in the above HPI section.  All other review of systems was otherwise negative.  Outpatient Encounter Prescriptions as of 01/20/2015  Medication Sig  . anastrozole (ARIMIDEX) 1 MG tablet Take 1 tablet (1 mg total) by mouth daily.  Marland Kitchen aspirin 81 MG chewable tablet Chew 81 mg by mouth daily.  Marland Kitchen atropine 1 % ophthalmic solution Place 1 drop into the right eye 4 (four) times daily.    Marland Kitchen donepezil (ARICEPT) 5 MG tablet Take 5 mg by mouth at bedtime.   . feeding supplement, GLUCERNA SHAKE, (GLUCERNA SHAKE) LIQD Take 237 mLs by mouth daily.   . folic acid (FOLVITE) 1 MG tablet Take 1 tablet (1 mg total) by mouth daily.  . furosemide (LASIX) 40 MG tablet Take 1 tablet by mouth  daily as needed for edema  . gabapentin (NEURONTIN) 100 MG capsule Take 100 mg by mouth 2 (two) times daily.   Marland Kitchen glipiZIDE (GLUCOTROL) 5 MG tablet TAKE ONE-HALF TABLET BY  MOUTH DAILY ONLY IF BLOOD  SUGAR IS OVER 200  . glucose blood (ACCU-CHEK AVIVA) test strip Use as instructed to check blood sugar 5 times daily dx code E11.9  . imipenem-cilastatin 250 mg in sodium chloride 0.9 % 100 mL Inject 250 mg into the vein every 6 (six) hours. Stop after last dose on 08/31/14  . levothyroxine (SYNTHROID) 50 MCG tablet take 1 tablet by mouth once daily BEFORE BREAKFAST  . linagliptin (TRADJENTA) 5 MG TABS tablet Take 1 tablet (5 mg total) by mouth daily.  Marland Kitchen loratadine (CLARITIN) 10 MG tablet Take 10 mg by mouth daily.  . meclizine (ANTIVERT) 25 MG tablet Take 25 mg by mouth 3 (three) times daily as needed for dizziness.  . nitroGLYCERIN (NITRODUR - DOSED IN MG/24 HR) 0.2 mg/hr patch Place 1 patch (0.2 mg total) onto the skin daily.  Marland Kitchen omeprazole (PRILOSEC) 20 MG capsule Take 1 capsule by mouth  daily  . Polyvinyl Alcohol-Povidone (REFRESH OP) Place 1 drop  into both eyes daily as needed (dry eyes).   . potassium chloride (K-DUR,KLOR-CON) 10 MEQ tablet Take 1 tablet by mouth two  times daily  . ranolazine ([name]) 500 MG 12 hr tablet Take 1 tablet (500 mg total) by mouth 2 (two) times daily.  . rosuvastatin (CRESTOR) 10 MG tablet Take 1 tablet (10 mg total) by mouth 3 (three) times a week. [id number] Exp: 8-18 (Patient taking differently: Take 10 mg by mouth every Monday, Wednesday, and Friday. )  . traMADol (ULTRAM) 50 MG tablet Take 1 tablet (50 mg total) by mouth every 6 (six) hours as needed for moderate pain.    . Vitamin D, Ergocalciferol, (DRISDOL) 50000 UNITS CAPS capsule Take 1,000 Units by mouth daily.   Marland Kitchen warfarin (COUMADIN) 3 MG tablet Take 1 tablet by mouth daily or as directed by coumadin clinic  . zolpidem (AMBIEN) 5 MG tablet Take 0.5 tablets (2.5 mg total) by mouth at bedtime. sleep  . omeprazole (PRILOSEC) 20 MG capsule Take 1 capsule (20 mg total) by mouth 2 (two) times daily before a meal.   No facility-administered encounter medications on file as of 01/20/2015.   Allergies  Allergen Reactions  . Darvon     Unknown reaction   . Digoxin And Related Nausea Only  . Penicillins Other (See Comments)    Was told had allergy from childhood... Unknown reaction  . Percocet [Oxycodone-Acetaminophen] Other (See Comments)    confused  . Percodan [Oxycodone-Aspirin]   . Vicodin [Hydrocodone-Acetaminophen] Other (See Comments)    confused   Patient Active Problem List   Diagnosis Date Noted  . ESBL (extended spectrum beta-lactamase) producing bacteria infection   . Transaminitis   . Chronic combined systolic and diastolic CHF (congestive heart failure) ([place])   . Chronic atrial fibrillation ([place])   . Other specified hypothyroidism   . Diabetes type 2, uncontrolled ([place])   . Pressure ulcer 08/17/2014  . Blood poisoning ([place])   . Cancer of central portion of left female breast ([place]) 07/30/2014  . Periumbilical abdominal pain 06/26/2014  . Abnormal TSH 05/28/2014  . CKD (chronic kidney disease), stage III 05/28/2014  . Acute on chronic combined systolic and diastolic CHF (congestive heart failure) ([place])   . Acute respiratory failure with hypoxia ([place])   . Acute kidney injury ([place])   . SOB (shortness of breath) 05/24/2014  . UTI (urinary tract infection) 05/24/2014  . Abnormal LFTs (liver function tests)   . Epigastric pain   . Pancreatitis 02/04/2014  . Elevated LFTs   . Complete heart block ([place]) 11/19/2013  . Acquired autoimmune hypothyroidism 10/24/2013  . Abnormal nuclear  stress test, 08/2013 09/17/2013  . Arm pain, left 08/24/2013  . Abdominal pain, left lower quadrant 06/30/2013  . Dyspnea 02/23/2013  . PAF (paroxysmal atrial fibrillation) ([place]) 11/19/2012  . Sepsis ([place]) 11/18/2012  . DVT of upper extremity (deep vein thrombosis) ([place]) 11/18/2012  . Musculoskeletal chest pain 11/01/2012  . Diabetes mellitus type 2, uncontrolled ([place]) 07/21/2012  . Hypothyroidism 07/21/2012  . Chronic diastolic heart failure ([place]) 07/07/2012  . Long term current use of anticoagulant therapy 06/20/2012  . Acute pulmonary embolism ([place]) 06/18/2012  . DVT (deep venous thrombosis) ([place]) 06/13/2012  . Fever 02/22/2012  . Malnutrition ([place]) 01/01/2012  . Dyspepsia 12/30/2011  . UTI (lower urinary tract infection) 12/29/2011  . Chest pain 12/29/2011  . Hiatal hernia 12/29/2011  . Gastric ulcer 12/22/2011  . Fatigue 12/21/2011  . Pain, foot, right, chronic 12/21/2011  . Anemia of  chronic disease 12/19/2011  . Nausea with vomiting 12/19/2011  . Anorexia 12/19/2011  . Infection, staphylococcal 12/18/2011  . Bacterial UTI 12/18/2011  . Sepsis due to other specified Staphylococcus ([place]) 12/18/2011  . Hypertension 12/14/2011  . Hypotension 12/14/2011  . Acute on chronic renal failure ([place]) 12/14/2011  . Pacemaker 12/14/2011  . Hyperkalemia 12/14/2011  . Hyponatremia 12/14/2011  . Microcytic anemia 10/19/2011  . Abnormal weight loss 10/19/2011  . Dyspepsia and other specified disorders of function of stomach 10/19/2011  . Diabetes mellitus ([place]) 10/19/2011  . Coronary artery disease, CABG in 1994. Myoview abnormal Aug 2015- medical Rx 10/19/2011   Social History   Social History  . Marital Status: Married    Spouse Name: N/A  . Number of Children: 10  . Years of Education: N/A   Occupational History  .     Social History Main Topics  . Smoking status: Never Smoker   . Smokeless tobacco: Never Used  . Alcohol Use: No  . Drug Use: No  . Sexual Activity: No    Other Topics Concern  . Not on file   Social History Narrative    Ms. Whaling's family history includes Breast cancer in her daughter; Colon polyps in her daughter; Diabetes in her brother, daughter, father, and son; Heart attack in her brother; Heart disease in her father; Stroke in her sister.      Objective:    Filed Vitals:   01/20/15 1046  BP: 88/56  Pulse: 68    Physical Exam  well-developed elderly African-American female accompanied by family member patient is in a wheelchair. Pleasant and in no acute distress blood pressure 88/56 pulse 68 height 5 foot 4 weight 175. HEENT; nontraumatic normocephalic EOMI PERRLA sclera anicteric, Cardiovascular; regular rate and rhythm with S1-S2, O [place] clear bilaterally is a pacemaker in the left chest wall, Abdomen ;soft bowel sounds are active she has mild tenderness in the epigastrium there is no guarding or rebound no palpable mass or hepatosplenomegaly, Extremities; no clubbing cyanosis or edema skin warm and dry, Neuropsych ;mood and affect appropriate       Assessment & Plan:   #1 82 yo female with GERD, chronic dypepsia, hx small gastric ulcer 2013 #2 chronic constipation  #3 AODM #4 CAD  #5 CHF-EF 30-35% #6 New dx left breast CA- on Arimidex  Plan; Increase prilosec to 20 mg po BID Add Align one daily  Continue usual regimen for bowels as effective  Check CBC, CMET  Pt will be established Dr.Nandigam-will follow up as needed.   Nairi Oswald Genia Harold PA-C 01/20/2015   Cc: [name], MD

## 2015-01-20 NOTE — Patient Instructions (Signed)
We will call you with your lab test results. Take Align probiotic, 1 tablet daily. You can get this at [facility] or your pharmacy.  We sent a prescription for Omeprazole ( Prilosec) 20 mg . Take 1 tablet twice daily.  Follow  Up with Dr. Silverio Decamp in 6 months.

## 2015-01-25 NOTE — Progress Notes (Signed)
Reviewed and agree with documentation and assessment and plan. K. Veena Buren Havey , MD   

## 2015-01-27 NOTE — Assessment & Plan Note (Signed)
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 Pathology and radiology review: I discussed with the patient and her family the difference between DCIS and invasive breast cancer. I also discussed the significance of ER and PR receptors and the implications of treatment.  Recommendation:Patient is not a candidate for surgery because of her CHF/pacemaker Current treatment: Anastrozole 1 mg daily started June 2016 but she did not take it for a month because of hospitalizations for UTI and sepsis. Resumed anastrozole 1 mg every other day starting 09/17/2014  Reason for using anastrozole every other day is because of her nephew who has been doing tremendous amount of research into hormonal therapies and how they can affect and change internal metabolisms especially phosphatidylcholine and he would like her not to take higher dose of aromatase inhibitor. He also attributes the aromatase inhibitor to be responsible for her urinary tract infection.  Return to clinic in 1 year for follow-up

## 2015-01-28 ENCOUNTER — Encounter: Payer: Self-pay | Admitting: Hematology and Oncology

## 2015-01-28 ENCOUNTER — Telehealth: Payer: Self-pay | Admitting: Hematology and Oncology

## 2015-01-28 ENCOUNTER — Ambulatory Visit (HOSPITAL_BASED_OUTPATIENT_CLINIC_OR_DEPARTMENT_OTHER): Payer: PPO | Admitting: Hematology and Oncology

## 2015-01-28 VITALS — BP 89/57 | HR 70 | Temp 98.4°F | Resp 17

## 2015-01-28 DIAGNOSIS — C50112 Malignant neoplasm of central portion of left female breast: Secondary | ICD-10-CM

## 2015-01-28 DIAGNOSIS — D0512 Intraductal carcinoma in situ of left breast: Secondary | ICD-10-CM

## 2015-01-28 NOTE — Addendum Note (Signed)
Addended by: Prentiss Bells on: 01/28/2015 06:04 PM   Modules accepted: Medications

## 2015-01-28 NOTE — Telephone Encounter (Signed)
Appointments made and avs printed for patient °

## 2015-01-28 NOTE — Progress Notes (Signed)
Patient Care Team: [name], MD as PCP - General (Internal Medicine)  DIAGNOSIS: No matching staging information was found for the patient.  SUMMARY OF ONCOLOGIC HISTORY:   Cancer of central portion of left female breast ([place])   06/30/2014 Initial Diagnosis Left breast biopsy: DCIS ER 100%, PR 40%, 8.6 cm area of grouped segmental calcifications   07/30/2014 - 01/28/2015 Anti-estrogen oral therapy Anastrozole 1 mg every other day palliative treatment   CHIEF COMPLIANT: follow-up on anastrozole  INTERVAL HISTORY: [name] is a 82 year old with above-mentioned history of DCIS of the left breast currently on anastrozole. She is here today for routine follow-up and reports that the anastomosis causing her fatigue and hot flashes and severe sweats. She would like to discontinue taking all of these medications. It appears that she takes 2 pages full of medications every day.  REVIEW OF SYSTEMS:   Constitutional: Denies fevers, chills or abnormal weight loss Eyes:loss of vision Ears, nose, mouth, throat, and face: Denies mucositis or sore throat Respiratory: Denies cough, dyspnea or wheezes Cardiovascular: Denies palpitation, chest discomfort Gastrointestinal:  Denies nausea, heartburn or change in bowel habits Lymphatics: Denies new lymphadenopathy or easy bruising Neurological:generalized weakness Behavioral/Psych: Mood is stable, no new changes  Extremities: No lower extremity edema Breast:  denies any pain or lumps or nodules in either breasts All other systems were reviewed with the patient and are negative.  I have reviewed the past medical history, past surgical history, social history and family history with the patient and they are unchanged from previous note.  ALLERGIES:  is allergic to darvon; digoxin and related; penicillins; percocet; percodan; and vicodin.  MEDICATIONS:  Current Outpatient Prescriptions  Medication Sig Dispense Refill  . aspirin 81 MG  chewable tablet Chew 81 mg by mouth daily.    Marland Kitchen atropine 1 % ophthalmic solution Place 1 drop into the right eye 4 (four) times daily.   0  . donepezil (ARICEPT) 5 MG tablet Take 5 mg by mouth at bedtime.   0  . feeding supplement, GLUCERNA SHAKE, (GLUCERNA SHAKE) LIQD Take 237 mLs by mouth daily.     . folic acid (FOLVITE) 1 MG tablet Take 1 tablet (1 mg total) by mouth daily. 90 tablet 3  . furosemide (LASIX) 40 MG tablet Take 1 tablet by mouth  daily as needed for edema 90 tablet 3  . gabapentin (NEURONTIN) 100 MG capsule Take 100 mg by mouth 2 (two) times daily.     Marland Kitchen glipiZIDE (GLUCOTROL) 5 MG tablet TAKE ONE-HALF TABLET BY  MOUTH DAILY ONLY IF BLOOD  SUGAR IS OVER 200 45 tablet 1  . glucose blood (ACCU-CHEK AVIVA) test strip Use as instructed to check blood sugar 5 times daily dx code E11.9 150 each 3  . imipenem-cilastatin 250 mg in sodium chloride 0.9 % 100 mL Inject 250 mg into the vein every 6 (six) hours. Stop after last dose on 08/31/14 39 vial 0  . levothyroxine (SYNTHROID) 50 MCG tablet take 1 tablet by mouth once daily BEFORE BREAKFAST 90 tablet 1  . linagliptin (TRADJENTA) 5 MG TABS tablet Take 1 tablet (5 mg total) by mouth daily. 30 tablet 1  . loratadine (CLARITIN) 10 MG tablet Take 10 mg by mouth daily.    . meclizine (ANTIVERT) 25 MG tablet Take 25 mg by mouth 3 (three) times daily as needed for dizziness.    . nitroGLYCERIN (NITRODUR - DOSED IN MG/24 HR) 0.2 mg/hr patch Place 1 patch (0.2 mg total)  onto the skin daily. 30 patch 6  . omeprazole (PRILOSEC) 20 MG capsule Take 1 capsule by mouth  daily 90 capsule 2  . omeprazole (PRILOSEC) 20 MG capsule Take 1 capsule (20 mg total) by mouth 2 (two) times daily before a meal. 60 capsule 11  . Polyvinyl Alcohol-Povidone (REFRESH OP) Place 1 drop into both eyes daily as needed (dry eyes).     . potassium chloride (K-DUR,KLOR-CON) 10 MEQ tablet Take 1 tablet by mouth two  times daily 180 tablet 3  . ranolazine ([name]) 500 MG 12 hr  tablet Take 1 tablet (500 mg total) by mouth 2 (two) times daily. 180 tablet 1  . rosuvastatin (CRESTOR) 10 MG tablet Take 1 tablet (10 mg total) by mouth 3 (three) times a week. [id number] Exp: 8-18 (Patient taking differently: Take 10 mg by mouth every Monday, Wednesday, and Friday. ) 21 tablet 0  . traMADol (ULTRAM) 50 MG tablet Take 1 tablet (50 mg total) by mouth every 6 (six) hours as needed for moderate pain. 30 tablet 0  . Vitamin D, Ergocalciferol, (DRISDOL) 50000 UNITS CAPS capsule Take 1,000 Units by mouth daily.     Marland Kitchen warfarin (COUMADIN) 3 MG tablet Take 1 tablet by mouth daily or as directed by coumadin clinic 30 tablet 1  . zolpidem (AMBIEN) 5 MG tablet Take 0.5 tablets (2.5 mg total) by mouth at bedtime. sleep 30 tablet 0   No current facility-administered medications for this visit.    PHYSICAL EXAMINATION: ECOG PERFORMANCE STATUS: 1 - Symptomatic but completely ambulatory  Filed Vitals:   01/28/15 1537  BP: 89/57  Pulse: 70  Temp: 98.4 F (36.9 C)  Resp: 17   There were no vitals filed for this visit.  GENERAL:alert, no distress and comfortable SKIN: skin color, texture, turgor are normal, no rashes or significant lesions EYES: normal, Conjunctiva are pink and non-injected, sclera clear OROPHARYNX:no exudate, no erythema and lips, buccal mucosa, and tongue normal  NECK: supple, thyroid normal size, non-tender, without nodularity LYMPH:  no palpable lymphadenopathy in the cervical, axillary or inguinal LUNGS: clear to auscultation and percussion with normal breathing effort HEART: regular rate & rhythm and no murmurs and no lower extremity edema ABDOMEN:abdomen soft, non-tender and normal bowel sounds MUSCULOSKELETAL:no cyanosis of digits and no clubbing  NEURO: alert & oriented x 3 with fluent speech, lower extremity weakness EXTREMITIES: No lower extremity edema  LABORATORY DATA:  I have reviewed the data as listed   Chemistry      Component Value Date/Time     NA 139 01/20/2015 1129   K 5.1 01/20/2015 1129   CL 103 01/20/2015 1129   CO2 31 01/20/2015 1129   BUN 24* 01/20/2015 1129   CREATININE 1.55* 01/20/2015 1129   CREATININE 1.33* 02/12/2014 1535      Component Value Date/Time   CALCIUM 9.5 01/20/2015 1129   ALKPHOS 84 01/20/2015 1129   AST 15 01/20/2015 1129   ALT 7 01/20/2015 1129   BILITOT 0.6 01/20/2015 1129       Lab Results  Component Value Date   WBC 6.4 01/20/2015   HGB 12.1 01/20/2015   HCT 37.1 01/20/2015   MCV 89.3 01/20/2015   PLT 176.0 01/20/2015   NEUTROABS 4.0 01/20/2015     ASSESSMENT & PLAN:  Cancer of central portion of left female breast ([place]) DCIS left breast ER 100% PR 40%, 8.6 area of calcifications status post biopsy on 06/30/2014. Pathology and radiology review: I discussed with the patient and  her family the difference between DCIS and invasive breast cancer. I also discussed the significance of ER and PR receptors and the implications of treatment.  Recommendation:Patient is not a candidate for surgery because of her CHF/pacemaker Current treatment: Anastrozole 1 mg daily started June 2016 but she did not take it for a month because of hospitalizations for UTI and sepsis. Resumed anastrozole 1 mg every other day starting 09/17/2014  Patient was complaining of having fatigue as well as severe sweats. After much discussion I recommended discontinuation of anastrozole. Patient's family is also very concerned about polypharmacy. We reviewed her entire list of medications and discussed about discontinuation of Claritin, Antivert. I discussed that she needs to sit down with her primary care physician and determine what medicines are necessary and what are not.  Patient is agreeable to discontinuation of anastrozole  Return to clinic in 6 months for follow-up  No orders of the defined types were placed in this encounter.   The patient has a good understanding of the overall plan. she agrees with it.  she will call with any problems that may develop before the next visit here.   Rulon Eisenmenger, MD 01/28/2015

## 2015-02-01 ENCOUNTER — Ambulatory Visit (INDEPENDENT_AMBULATORY_CARE_PROVIDER_SITE_OTHER): Payer: PPO | Admitting: Pharmacist Clinician (PhC)/ Clinical Pharmacy Specialist

## 2015-02-01 DIAGNOSIS — Z7901 Long term (current) use of anticoagulants: Secondary | ICD-10-CM

## 2015-02-01 DIAGNOSIS — I824Z9 Acute embolism and thrombosis of unspecified deep veins of unspecified distal lower extremity: Secondary | ICD-10-CM

## 2015-02-03 ENCOUNTER — Ambulatory Visit (INDEPENDENT_AMBULATORY_CARE_PROVIDER_SITE_OTHER): Payer: PPO | Admitting: Pharmacist Clinician (PhC)/ Clinical Pharmacy Specialist

## 2015-02-03 DIAGNOSIS — Z7901 Long term (current) use of anticoagulants: Secondary | ICD-10-CM

## 2015-02-03 DIAGNOSIS — I824Z9 Acute embolism and thrombosis of unspecified deep veins of unspecified distal lower extremity: Secondary | ICD-10-CM

## 2015-02-03 LAB — POCT INR: INR: 2

## 2015-02-15 ENCOUNTER — Ambulatory Visit (INDEPENDENT_AMBULATORY_CARE_PROVIDER_SITE_OTHER): Payer: PPO | Admitting: *Deleted

## 2015-02-15 ENCOUNTER — Telehealth: Payer: Self-pay | Admitting: Cardiology

## 2015-02-15 DIAGNOSIS — I442 Atrioventricular block, complete: Secondary | ICD-10-CM | POA: Diagnosis not present

## 2015-02-15 NOTE — Telephone Encounter (Signed)
Spoke with pt and reminded pt of remote transmission that is due today. Pt verbalized understanding.   

## 2015-02-17 NOTE — Progress Notes (Signed)
Remote pacemaker transmission.   

## 2015-02-19 ENCOUNTER — Other Ambulatory Visit: Payer: Self-pay | Admitting: Endocrinology

## 2015-02-22 ENCOUNTER — Other Ambulatory Visit: Payer: Self-pay | Admitting: *Deleted

## 2015-02-22 ENCOUNTER — Telehealth: Payer: Self-pay | Admitting: Endocrinology

## 2015-02-22 ENCOUNTER — Telehealth: Payer: Self-pay | Admitting: Cardiovascular Disease

## 2015-02-22 DIAGNOSIS — I25119 Atherosclerotic heart disease of native coronary artery with unspecified angina pectoris: Secondary | ICD-10-CM

## 2015-02-22 DIAGNOSIS — I442 Atrioventricular block, complete: Secondary | ICD-10-CM

## 2015-02-22 LAB — CUP PACEART REMOTE DEVICE CHECK
Battery Voltage: 2.62 V
Brady Statistic AP VS Percent: 0 %
Brady Statistic AS VP Percent: 85 %
Implantable Lead Implant Date: 20080613
Implantable Lead Location: 753859
Implantable Lead Model: 5076
Lead Channel Impedance Value: 581 Ohm
Lead Channel Pacing Threshold Amplitude: 1.125 V
Lead Channel Pacing Threshold Pulse Width: 0.4 ms
Lead Channel Pacing Threshold Pulse Width: 0.4 ms
Lead Channel Setting Pacing Amplitude: 2 V
Lead Channel Setting Sensing Sensitivity: 5.6 mV
MDC IDC LEAD IMPLANT DT: 20080613
MDC IDC LEAD LOCATION: 753860
MDC IDC MSMT BATTERY IMPEDANCE: 5330 Ohm
MDC IDC MSMT BATTERY REMAINING LONGEVITY: 3 mo
MDC IDC MSMT LEADCHNL RA IMPEDANCE VALUE: 529 Ohm
MDC IDC MSMT LEADCHNL RA SENSING INTR AMPL: 1.4 mV
MDC IDC MSMT LEADCHNL RV PACING THRESHOLD AMPLITUDE: 0.75 V
MDC IDC SESS DTM: 20170131224344
MDC IDC SET LEADCHNL RA PACING AMPLITUDE: 2.25 V
MDC IDC SET LEADCHNL RV PACING PULSEWIDTH: 0.4 ms
MDC IDC STAT BRADY AP VP PERCENT: 13 %
MDC IDC STAT BRADY AS VS PERCENT: 2 %

## 2015-02-22 MED ORDER — GLUCOSE BLOOD VI STRP: ORAL_STRIP | Status: DC

## 2015-02-22 MED ORDER — RANOLAZINE ER 500 MG PO TB12: 500.0000 mg | ORAL_TABLET | Freq: Two times a day (BID) | ORAL | Status: DC

## 2015-02-22 NOTE — Telephone Encounter (Signed)
New message    Patient calling stating someone called them today returning call back to nurse

## 2015-02-22 NOTE — Telephone Encounter (Signed)
Pt called wanting to know if we have any test strips she could have

## 2015-02-22 NOTE — Telephone Encounter (Signed)
Samples of ranexa requested. Aware i put at front desk.

## 2015-02-22 NOTE — Telephone Encounter (Signed)
We do not have those in the office.

## 2015-02-23 ENCOUNTER — Other Ambulatory Visit: Payer: Self-pay | Admitting: *Deleted

## 2015-02-23 MED ORDER — ONETOUCH ULTRA 2 W/DEVICE KIT: PACK | Status: DC

## 2015-02-23 MED ORDER — ONETOUCH DELICA LANCETS 33G MISC: Status: DC

## 2015-02-23 MED ORDER — GLUCOSE BLOOD VI STRP: ORAL_STRIP | Status: DC

## 2015-02-24 ENCOUNTER — Encounter: Payer: Self-pay | Admitting: Cardiology

## 2015-02-24 ENCOUNTER — Ambulatory Visit (INDEPENDENT_AMBULATORY_CARE_PROVIDER_SITE_OTHER): Payer: PPO | Admitting: Pharmacist Clinician (PhC)/ Clinical Pharmacy Specialist

## 2015-02-24 DIAGNOSIS — Z7901 Long term (current) use of anticoagulants: Secondary | ICD-10-CM

## 2015-02-24 DIAGNOSIS — I824Z9 Acute embolism and thrombosis of unspecified deep veins of unspecified distal lower extremity: Secondary | ICD-10-CM

## 2015-02-24 LAB — POCT INR: INR: 1.4

## 2015-02-26 ENCOUNTER — Encounter: Payer: Self-pay | Admitting: Cardiology

## 2015-03-08 ENCOUNTER — Other Ambulatory Visit: Payer: Self-pay | Admitting: Pharmacist Clinician (PhC)/ Clinical Pharmacy Specialist

## 2015-03-08 MED ORDER — WARFARIN SODIUM 3 MG PO TABS: ORAL_TABLET | ORAL | Status: DC

## 2015-03-10 ENCOUNTER — Ambulatory Visit (INDEPENDENT_AMBULATORY_CARE_PROVIDER_SITE_OTHER): Payer: PPO | Admitting: Pharmacist Clinician (PhC)/ Clinical Pharmacy Specialist

## 2015-03-10 DIAGNOSIS — Z7901 Long term (current) use of anticoagulants: Secondary | ICD-10-CM

## 2015-03-10 DIAGNOSIS — I824Z9 Acute embolism and thrombosis of unspecified deep veins of unspecified distal lower extremity: Secondary | ICD-10-CM

## 2015-03-10 LAB — POCT INR: INR: 1.8

## 2015-03-17 ENCOUNTER — Encounter: Payer: PPO | Admitting: *Deleted

## 2015-03-17 ENCOUNTER — Telehealth: Payer: Self-pay | Admitting: Cardiology

## 2015-03-17 NOTE — Telephone Encounter (Signed)
Spoke with pt and reminded pt of remote transmission that is due today. Pt verbalized understanding.   

## 2015-03-19 ENCOUNTER — Telehealth: Payer: Self-pay | Admitting: Gastroenterology

## 2015-03-19 ENCOUNTER — Encounter: Payer: Self-pay | Admitting: Cardiology

## 2015-03-19 ENCOUNTER — Telehealth: Payer: Self-pay | Admitting: Cardiovascular Disease

## 2015-03-19 NOTE — Telephone Encounter (Signed)
Patient would also like a list of her medications sent to [facility] [phone] Patients ID # is [id number] Copy of medication list on record faxed

## 2015-03-19 NOTE — Telephone Encounter (Signed)
I have left message for the daughter to call back about the patient.

## 2015-03-19 NOTE — Telephone Encounter (Signed)
New message  ° ° ° °Patient calling the office for samples of medication: ° ° °1.  What medication and dosage are you requesting samples for? ranexa 500 mg  ° °2.  Are you currently out of this medication? Yes  ° ° °

## 2015-03-19 NOTE — Telephone Encounter (Signed)
Medication Samples have been provided to the patient.  Drug name: [name]      Strength: 500 mg        Qty: 56  LOT: [id number]  Exp.Date: 12/2007  The patient has been instructed regarding the correct time, dose, and frequency of taking this medication, including desired effects and most common side effects.   Kathy Breach 9:26 AM 03/19/2015

## 2015-03-25 LAB — POCT INR: INR: 2

## 2015-03-26 ENCOUNTER — Ambulatory Visit (INDEPENDENT_AMBULATORY_CARE_PROVIDER_SITE_OTHER): Payer: PPO | Admitting: Pharmacist Clinician (PhC)/ Clinical Pharmacy Specialist

## 2015-03-26 DIAGNOSIS — I824Z9 Acute embolism and thrombosis of unspecified deep veins of unspecified distal lower extremity: Secondary | ICD-10-CM

## 2015-03-26 DIAGNOSIS — Z7901 Long term (current) use of anticoagulants: Secondary | ICD-10-CM

## 2015-03-29 ENCOUNTER — Observation Stay (HOSPITAL_COMMUNITY)
Admission: EM | Admit: 2015-03-29 | Discharge: 2015-03-31 | Disposition: A | Discharge status: Home / Self Care | Payer: PPO | Attending: Internal Medicine | Admitting: Internal Medicine

## 2015-03-29 ENCOUNTER — Telehealth: Payer: Self-pay

## 2015-03-29 ENCOUNTER — Encounter (HOSPITAL_COMMUNITY): Payer: Self-pay | Admitting: Emergency Medicine

## 2015-03-29 ENCOUNTER — Emergency Department (HOSPITAL_COMMUNITY): Payer: PPO

## 2015-03-29 DIAGNOSIS — R531 Weakness: Secondary | ICD-10-CM | POA: Diagnosis not present

## 2015-03-29 DIAGNOSIS — Z7982 Long term (current) use of aspirin: Secondary | ICD-10-CM | POA: Insufficient documentation

## 2015-03-29 DIAGNOSIS — D649 Anemia, unspecified: Secondary | ICD-10-CM | POA: Diagnosis not present

## 2015-03-29 DIAGNOSIS — I2510 Atherosclerotic heart disease of native coronary artery without angina pectoris: Secondary | ICD-10-CM | POA: Diagnosis not present

## 2015-03-29 DIAGNOSIS — R55 Syncope and collapse: Secondary | ICD-10-CM | POA: Diagnosis present

## 2015-03-29 DIAGNOSIS — Z950 Presence of cardiac pacemaker: Secondary | ICD-10-CM | POA: Diagnosis not present

## 2015-03-29 DIAGNOSIS — N390 Urinary tract infection, site not specified: Secondary | ICD-10-CM | POA: Insufficient documentation

## 2015-03-29 DIAGNOSIS — Z7901 Long term (current) use of anticoagulants: Secondary | ICD-10-CM | POA: Diagnosis not present

## 2015-03-29 DIAGNOSIS — R6521 Severe sepsis with septic shock: Secondary | ICD-10-CM | POA: Diagnosis not present

## 2015-03-29 DIAGNOSIS — E7800 Pure hypercholesterolemia, unspecified: Secondary | ICD-10-CM | POA: Diagnosis not present

## 2015-03-29 DIAGNOSIS — H548 Legal blindness, as defined in USA: Secondary | ICD-10-CM | POA: Insufficient documentation

## 2015-03-29 DIAGNOSIS — K219 Gastro-esophageal reflux disease without esophagitis: Secondary | ICD-10-CM | POA: Insufficient documentation

## 2015-03-29 DIAGNOSIS — E039 Hypothyroidism, unspecified: Secondary | ICD-10-CM | POA: Diagnosis not present

## 2015-03-29 DIAGNOSIS — R112 Nausea with vomiting, unspecified: Secondary | ICD-10-CM | POA: Diagnosis present

## 2015-03-29 DIAGNOSIS — K259 Gastric ulcer, unspecified as acute or chronic, without hemorrhage or perforation: Secondary | ICD-10-CM | POA: Insufficient documentation

## 2015-03-29 DIAGNOSIS — A419 Sepsis, unspecified organism: Secondary | ICD-10-CM | POA: Insufficient documentation

## 2015-03-29 DIAGNOSIS — I959 Hypotension, unspecified: Secondary | ICD-10-CM | POA: Diagnosis present

## 2015-03-29 DIAGNOSIS — M1990 Unspecified osteoarthritis, unspecified site: Secondary | ICD-10-CM | POA: Insufficient documentation

## 2015-03-29 DIAGNOSIS — N183 Chronic kidney disease, stage 3 (moderate): Secondary | ICD-10-CM | POA: Insufficient documentation

## 2015-03-29 DIAGNOSIS — Z86718 Personal history of other venous thrombosis and embolism: Secondary | ICD-10-CM | POA: Diagnosis not present

## 2015-03-29 DIAGNOSIS — Z9889 Other specified postprocedural states: Secondary | ICD-10-CM | POA: Diagnosis not present

## 2015-03-29 DIAGNOSIS — I129 Hypertensive chronic kidney disease with stage 1 through stage 4 chronic kidney disease, or unspecified chronic kidney disease: Secondary | ICD-10-CM | POA: Insufficient documentation

## 2015-03-29 DIAGNOSIS — Z880 Allergy status to penicillin: Secondary | ICD-10-CM | POA: Insufficient documentation

## 2015-03-29 DIAGNOSIS — E119 Type 2 diabetes mellitus without complications: Secondary | ICD-10-CM

## 2015-03-29 DIAGNOSIS — I9589 Other hypotension: Principal | ICD-10-CM | POA: Insufficient documentation

## 2015-03-29 DIAGNOSIS — K449 Diaphragmatic hernia without obstruction or gangrene: Secondary | ICD-10-CM | POA: Diagnosis not present

## 2015-03-29 DIAGNOSIS — Z79899 Other long term (current) drug therapy: Secondary | ICD-10-CM | POA: Diagnosis not present

## 2015-03-29 HISTORY — DX: Presence of cardiac pacemaker: Z95.0

## 2015-03-29 LAB — URINALYSIS, ROUTINE W REFLEX MICROSCOPIC
BILIRUBIN URINE: NEGATIVE
GLUCOSE, UA: NEGATIVE mg/dL
KETONES UR: NEGATIVE mg/dL
Nitrite: NEGATIVE
PROTEIN: NEGATIVE mg/dL
Specific Gravity, Urine: 1.015 (ref 1.005–1.030)
pH: 6.5 (ref 5.0–8.0)

## 2015-03-29 LAB — BASIC METABOLIC PANEL
Anion gap: 13 (ref 5–15)
BUN: 24 mg/dL — AB (ref 6–20)
CALCIUM: 9.3 mg/dL (ref 8.9–10.3)
CHLORIDE: 101 mmol/L (ref 101–111)
CO2: 25 mmol/L (ref 22–32)
CREATININE: 1.83 mg/dL — AB (ref 0.44–1.00)
GFR calc Af Amer: 26 mL/min — ABNORMAL LOW (ref >60)
GFR calc non Af Amer: 23 mL/min — ABNORMAL LOW (ref >60)
Glucose, Bld: 207 mg/dL — ABNORMAL HIGH (ref 65–99)
Potassium: 4.6 mmol/L (ref 3.5–5.1)
SODIUM: 139 mmol/L (ref 135–145)

## 2015-03-29 LAB — URINE MICROSCOPIC-ADD ON

## 2015-03-29 LAB — CBC
HCT: 38.1 % (ref 36.0–46.0)
Hemoglobin: 12.2 g/dL (ref 12.0–15.0)
MCH: 29.5 pg (ref 26.0–34.0)
MCHC: 32 g/dL (ref 30.0–36.0)
MCV: 92 fL (ref 78.0–100.0)
PLATELETS: 143 10*3/uL — AB (ref 150–400)
RBC: 4.14 MIL/uL (ref 3.87–5.11)
RDW: 13.8 % (ref 11.5–15.5)
WBC: 5.8 10*3/uL (ref 4.0–10.5)

## 2015-03-29 LAB — CBG MONITORING, ED: Glucose-Capillary: 188 mg/dL — ABNORMAL HIGH (ref 65–99)

## 2015-03-29 LAB — PROTIME-INR
INR: 3 — AB (ref 0.00–1.49)
PROTHROMBIN TIME: 30.6 s — AB (ref 11.6–15.2)

## 2015-03-29 LAB — I-STAT CG4 LACTIC ACID, ED: Lactic Acid, Venous: 1.89 mmol/L (ref 0.5–2.0)

## 2015-03-29 MED ORDER — SODIUM CHLORIDE 0.9 % IV BOLUS (SEPSIS)
500.0000 mL | INTRAVENOUS | Status: AC
  Administered 2015-03-30: 500 mL via INTRAVENOUS

## 2015-03-29 MED ORDER — DEXTROSE 5 % IV SOLN: 1.0000 g | Freq: Once | INTRAVENOUS | Status: DC

## 2015-03-29 MED ORDER — SODIUM CHLORIDE 0.9 % IV BOLUS (SEPSIS)
1000.0000 mL | Freq: Once | INTRAVENOUS | Status: AC
  Administered 2015-03-29: 1000 mL via INTRAVENOUS

## 2015-03-29 MED ORDER — DEXTROSE 5 % IV SOLN
1.0000 g | Freq: Every day | INTRAVENOUS | Status: DC
  Administered 2015-03-30 (×2): 1 g via INTRAVENOUS
  Filled 2015-03-29 (×2): qty 10

## 2015-03-29 MED ORDER — SODIUM CHLORIDE 0.9 % IV BOLUS (SEPSIS)
1000.0000 mL | INTRAVENOUS | Status: DC
  Administered 2015-03-30: 1000 mL via INTRAVENOUS

## 2015-03-29 NOTE — Progress Notes (Addendum)
Pharmacy Antibiotic Note  [name] is a 82 y.o. female admitted on 03/29/2015 with UTI.  Pharmacy has been consulted for Rocephin dosing.  Plan: Rocephin 1gm IV q24h Pharmacy will sign off - please reconsult if needed     Temp (24hrs), Avg:97.9 F (36.6 C), Min:97.9 F (36.6 C), Max:97.9 F (36.6 C)   Recent Labs Lab 03/29/15 2135 03/29/15 2138  WBC 5.8  --   CREATININE 1.83*  --   LATICACIDVEN  --  1.89    CrCl cannot be calculated (Unknown ideal weight.).    Allergies  Allergen Reactions  . Darvon     Unknown reaction   . Digoxin And Related Nausea Only  . Penicillins Other (See Comments)    Was told had allergy from childhood... Unknown reaction  . Percocet [Oxycodone-Acetaminophen] Other (See Comments)    confused  . Percodan [Oxycodone-Aspirin]   . Vicodin [Hydrocodone-Acetaminophen] Other (See Comments)    confused    Antimicrobials this admission: 3/13 Rocephin >>   Microbiology results: 3/13 BCx:  3/14 UCx:    Thank you for allowing pharmacy to be a part of this patient's care.  Sherlon Handing, PharmD, BCPS Clinical pharmacist, pager [phone] 03/29/2015 11:34 PM   Pharmacy Code Sepsis Protocol  Time of code sepsis page: 2250 [x]  Antibiotics pulled from pyxis 2255  Were antibiotics ordered at the time of the code sepsis page? Yes Was it required to contact the physician? [x]  Physician not contacted []  Physician contacted to order antibiotics for code sepsis []  Physician contacted to recommend changing antibiotics  Pharmacy consulted for: Rocephin  Anti-infectives    Start     Dose/Rate Route Frequency Ordered Stop   03/29/15 2345  cefTRIAXone (ROCEPHIN) 1 g in dextrose 5 % 50 mL IVPB  Status:  Discontinued     1 g 100 mL/hr over 30 Minutes Intravenous  Once 03/29/15 2333 03/29/15 2338   03/29/15 2345  cefTRIAXone (ROCEPHIN) 1 g in dextrose 5 % 50 mL IVPB     1 g 100 mL/hr over 30 Minutes Intravenous Daily at bedtime 03/29/15 2338           Nurse education provided - none needed  Sherlon Handing, PharmD, BCPS Clinical pharmacist, pager [phone]  03/29/2015, 11:52 PM

## 2015-03-29 NOTE — Telephone Encounter (Signed)
Patient's grandson calling wanting to speak with Dr. Lindi Adie.  Call transferred to [facility], [place].

## 2015-03-29 NOTE — ED Notes (Signed)
cbg is 188

## 2015-03-29 NOTE — ED Notes (Addendum)
Per ems-- pt daughter checked bp at home while pt was using the bathroom and it was low. Upon ems arrival pt still hypotensive. When they stood pt up from toilet pt. Had syncopal episode lasting approx 30 sec. Once pt was laying down again she started to feel much better. Pt has pacemaker. Pt took 324 asa prior to ems arrival but vomited.. Pt is diaphoretic but she sts this is her baseline. Pt sts she feels weak in chest. Pt took laxative today which she takes about once/week

## 2015-03-29 NOTE — ED Notes (Signed)
Family reported she took a laxative today

## 2015-03-29 NOTE — ED Notes (Signed)
Dr. Ashok Cordia aware of bp. Fluids started.

## 2015-03-29 NOTE — ED Provider Notes (Signed)
CSN: [id number]     Arrival date & time 03/29/15  2027 History   First MD Initiated Contact with Patient 03/29/15 2040     Chief Complaint  Patient presents with  . Hypotension     (Consider location/radiation/quality/duration/timing/severity/associated sxs/prior Treatment) The history is provided by the patient, a relative and the EMS personnel. The history is limited by the condition of the patient.  Patient w hx cad, htn, presents w generalized weakness onset today.  Pt denies focal or unilateral numbness/weakness. Pt very difficult historian - level 5 caveat.  Pt indicates felt gen weak incl 'weak in chest'. Denies episodic chest pain.  +lightheaded/faint. No fever at home. Rare non prod cough. No dysuria. Vomited x 1. No diarrhea. No blood loss/melena. No recent change in meds.      Past Medical History  Diagnosis Date  . Hypertension   . Coronary artery disease     a. 1994 s/p cabg;  b. 09/2013 MV: large, sev intensity, partially reversible inf, apical defect, prior inf/ap infarct w/ mild peri-infarct ischemia->Med Rx.  . Legally blind   . CKD (chronic kidney disease), stage III     a. iii - iv.  . Anemia   . Hypothyroid   . Gastric ulcer   . Hiatal hernia   . Dyspepsia   . UTI (lower urinary tract infection)   . H/O: GI bleed 12//13  . High cholesterol   . Chronic combined systolic and diastolic CHF (congestive heart failure) ([place])     a. 01/2014 Echo: EF @ least mod-sev reduced with HK of lat/apical, basal inf walls. basalpost AK, Gr 1DD.  Marland Kitchen DVT of upper extremity (deep vein thrombosis) ([place]) 06/13/2012    BUE  . Seasonal allergies   . Allergy to perfume   . Type II diabetes mellitus ([place])   . History of blood transfusion 2013  . GERD (gastroesophageal reflux disease)   . Daily headache   . Arthritis   . PAF (paroxysmal atrial fibrillation) ([facility])     a. CHA2DS2VASc = 7-->coumadin.  . 2Nd degree atrioventricular block     a. s/p MDT ADDR01 DC PPM (ser #: [id number]).    Past Surgical History  Procedure Laterality Date  . Esophagogastroduodenoscopy  12/22/2011    Procedure: ESOPHAGOGASTRODUODENOSCOPY (EGD);  Surgeon: Gatha Mayer, MD;  Location: [facility];  Service: Endoscopy;  Laterality: N/A;  . Cardioversion  06/06/06    successful  . Insert / replace / remove pacemaker  06/29/2006    Medtronic adapta  . Cardiac catheterization  10/25/92  . Cardiac catheterization  11/18/03    w/grafts 100%CX LAD 80 & 100%  . Cardiac catheterization  01/24/05    diffuse disease of native vessels  . Cardiac catheterization  06/06/06    severe native CAD  . Coronary artery bypass graft  10/27/92    LIMA to LAD,SVG to LAD second diagonal,obtuse maraginal of the CX and posterior descendingbranch of the RCA  . Cataract extraction w/ intraocular lens  implant, bilateral Bilateral   . Refractive surgery Bilateral    Family History  Problem Relation Age of Onset  . Breast cancer Daughter   . Diabetes Son   . Diabetes Daughter   . Heart disease Father   . Diabetes Father   . Colon polyps Daughter   . Heart attack Brother   . Diabetes Brother   . Stroke Sister    Social History  Substance Use Topics  . Smoking status: Never Smoker   .  Smokeless tobacco: Never Used  . Alcohol Use: No   OB History    No data available       Review of Systems  Unable to perform ROS: Mental status change  Constitutional: Negative for fever.  Respiratory: Negative for shortness of breath.   Neurological: Positive for weakness.  level 5 caveat - pt poorly responsive to many ros questions.       Allergies  [place]; Digoxin and related; Penicillins; Percocet; Percodan; and Vicodin  Home Medications   Prior to Admission medications   Medication Sig Start Date End Date Taking? Authorizing Provider  aspirin 81 MG chewable tablet Chew 81 mg by mouth daily.   Yes Historical Provider, MD  atropine 1 % ophthalmic solution Place 1 drop into the right eye 4 (four) times daily.   10/29/13  Yes Historical Provider, MD  donepezil (ARICEPT) 5 MG tablet Take 5 mg by mouth at bedtime.  07/13/14  Yes Historical Provider, MD  feeding supplement, GLUCERNA SHAKE, (GLUCERNA SHAKE) LIQD Take 237 mLs by mouth daily.    Yes Historical Provider, MD  folic acid (FOLVITE) 1 MG tablet Take 1 tablet (1 mg total) by mouth daily. 01/19/14  Yes Mihai Croitoru, MD  furosemide (LASIX) 40 MG tablet Take 1 tablet by mouth  daily as needed for edema 12/04/14  Yes Mihai Croitoru, MD  gabapentin (NEURONTIN) 100 MG capsule Take 100 mg by mouth 2 (two) times daily.    Yes Historical Provider, MD  glipiZIDE (GLUCOTROL) 5 MG tablet TAKE ONE-HALF TABLET BY  MOUTH DAILY ONLY IF BLOOD  SUGAR IS OVER 200 12/14/14  Yes Elayne Snare, MD  levothyroxine (SYNTHROID) 50 MCG tablet take 1 tablet by mouth once daily BEFORE BREAKFAST 10/29/14  Yes Elayne Snare, MD  linagliptin (TRADJENTA) 5 MG TABS tablet Take 1 tablet (5 mg total) by mouth daily. 09/30/14  Yes Elayne Snare, MD  loratadine (CLARITIN) 10 MG tablet Take 10 mg by mouth daily.   Yes Historical Provider, MD  meclizine (ANTIVERT) 25 MG tablet Take 25 mg by mouth 3 (three) times daily as needed for dizziness.   Yes Historical Provider, MD  nitroGLYCERIN (NITRODUR - DOSED IN MG/24 HR) 0.2 mg/hr patch Place 1 patch (0.2 mg total) onto the skin daily. 12/21/14  Yes Mihai Croitoru, MD  omeprazole (PRILOSEC) 20 MG capsule Take 1 capsule by mouth  daily 07/14/14  Yes Mihai Croitoru, MD  omeprazole (PRILOSEC) 20 MG capsule Take 1 capsule (20 mg total) by mouth 2 (two) times daily before a meal. 01/20/15  Yes Amy S Esterwood, PA-C  Polyvinyl Alcohol-Povidone (REFRESH OP) Place 1 drop into both eyes daily as needed (dry eyes).    Yes Historical Provider, MD  potassium chloride (K-DUR,KLOR-CON) 10 MEQ tablet Take 1 tablet by mouth two  times daily 12/04/14  Yes Mihai Croitoru, MD  ranolazine ([name]) 500 MG 12 hr tablet Take 1 tablet (500 mg total) by mouth 2 (two) times daily.  02/22/15  Yes Mihai Croitoru, MD  rosuvastatin (CRESTOR) 10 MG tablet Take 1 tablet (10 mg total) by mouth 3 (three) times a week. JAS:[id number] Exp: 8-18 Patient taking differently: Take 10 mg by mouth every Monday, Wednesday, and Friday.  02/18/14  Yes Luke K Kilroy, PA-C  SYNTHROID 50 MCG tablet take 1 tablet by mouth once daily BEFORE BREAKFAST 02/19/15  Yes Elayne Snare, MD  traMADol (ULTRAM) 50 MG tablet Take 1 tablet (50 mg total) by mouth every 6 (six) hours as needed for moderate pain. 08/22/14  Yes Orson Eva, MD  Vitamin D, Ergocalciferol, (DRISDOL) 50000 UNITS CAPS capsule Take 1,000 Units by mouth daily.    Yes Historical Provider, MD  warfarin (COUMADIN) 3 MG tablet Take 1 to 1.5 tablets by mouth daily or as directed by coumadin clinic Patient taking differently: Take 3 mg by mouth See admin instructions. Take 1 and 1/2 tablets on Monday, Wednesday and Friday then take 1 tablet all the other days 03/08/15  Yes Mihai Croitoru, MD  zolpidem (AMBIEN) 5 MG tablet Take 0.5 tablets (2.5 mg total) by mouth at bedtime. sleep 08/22/14  Yes Orson Eva, MD  Blood Glucose Monitoring Suppl (ONE TOUCH ULTRA 2) w/Device KIT Use to check blood sugar 2 times per day dx code E11.65 02/23/15   Elayne Snare, MD  glucose blood (ONE TOUCH ULTRA TEST) test strip Use as instructed to check blood sugar 2 times per day dx code E11.65 02/23/15   Elayne Snare, MD  imipenem-cilastatin 250 mg in sodium chloride 0.9 % 100 mL Inject 250 mg into the vein every 6 (six) hours. Stop after last dose on 08/31/14 Patient not taking: Reported on 03/29/2015 08/22/14   Orson Eva, MD  [facility] DELICA LANCETS 30Z MISC Use to check blood sugar 2 times per day dx code E11.65 02/23/15   Elayne Snare, MD   BP 90/56 mmHg  Pulse 64  Temp(Src) 97.9 F (36.6 C) (Oral)  Resp 13  SpO2 94% Physical Exam  Constitutional:  Pt frail, generally weak appearing, hypotensive.   HENT:  Head: Atraumatic.  Mouth/Throat: Oropharynx is clear and moist.  Eyes: Conjunctivae  are normal. Pupils are equal, round, and reactive to light. No scleral icterus.  Neck: Normal range of motion. Neck supple. No tracheal deviation present.  No stiffness or rigidity  Cardiovascular: Normal rate, regular rhythm, normal heart sounds and intact distal pulses.   No murmur heard. Pulmonary/Chest: Effort normal and breath sounds normal. No respiratory distress.  Abdominal: Soft. Normal appearance and bowel sounds are normal. She exhibits no distension and no mass. There is no tenderness. There is no rebound and no guarding.  Genitourinary:  No cva tenderness  Musculoskeletal: She exhibits no edema or tenderness.  Neurological: She is alert.  Moves bil ext purposefully.   Skin: Skin is warm and dry. No rash noted.  Psychiatric: She has a normal mood and affect.  Nursing note and vitals reviewed.   ED Course  Procedures (including critical care time) Labs Review   Results for orders placed or performed during the hospital encounter of 60/10/93  Basic metabolic panel  Result Value Ref Range   Sodium 139 135 - 145 mmol/L   Potassium 4.6 3.5 - 5.1 mmol/L   Chloride 101 101 - 111 mmol/L   CO2 25 22 - 32 mmol/L   Glucose, Bld 207 (H) 65 - 99 mg/dL   BUN 24 (H) 6 - 20 mg/dL   Creatinine, Ser 1.83 (H) 0.44 - 1.00 mg/dL   Calcium 9.3 8.9 - 10.3 mg/dL   GFR calc non Af Amer 23 (L) >60 mL/min   GFR calc Af Amer 26 (L) >60 mL/min   Anion gap 13 5 - 15  CBC  Result Value Ref Range   WBC 5.8 4.0 - 10.5 K/uL   RBC 4.14 3.87 - 5.11 MIL/uL   Hemoglobin 12.2 12.0 - 15.0 g/dL   HCT 38.1 36.0 - 46.0 %   MCV 92.0 78.0 - 100.0 fL   MCH 29.5 26.0 - 34.0 pg  MCHC 32.0 30.0 - 36.0 g/dL   RDW 13.8 11.5 - 15.5 %   Platelets 143 (L) 150 - 400 K/uL  Urinalysis, Routine w reflex microscopic (not at [facility])  Result Value Ref Range   Color, Urine AMBER (A) YELLOW   APPearance TURBID (A) CLEAR   Specific Gravity, Urine 1.015 1.005 - 1.030   pH 6.5 5.0 - 8.0   Glucose, UA NEGATIVE NEGATIVE  mg/dL   Hgb urine dipstick MODERATE (A) NEGATIVE   Bilirubin Urine NEGATIVE NEGATIVE   Ketones, ur NEGATIVE NEGATIVE mg/dL   Protein, ur NEGATIVE NEGATIVE mg/dL   Nitrite NEGATIVE NEGATIVE   Leukocytes, UA LARGE (A) NEGATIVE  Protime-INR  Result Value Ref Range   Prothrombin Time 30.6 (H) 11.6 - 15.2 seconds   INR 3.00 (H) 0.00 - 1.49  Urine microscopic-add on  Result Value Ref Range   Squamous Epithelial / LPF 6-30 (A) NONE SEEN   WBC, UA TOO NUMEROUS TO COUNT 0 - 5 WBC/hpf   RBC / HPF 6-30 0 - 5 RBC/hpf   Bacteria, UA MANY (A) NONE SEEN  CBG monitoring, ED  Result Value Ref Range   Glucose-Capillary 188 (H) 65 - 99 mg/dL  I-Stat CG4 Lactic Acid, ED  (not at  [facility])  Result Value Ref Range   Lactic Acid, Venous 1.89 0.5 - 2.0 mmol/L   Dg Chest Port 1 View  03/29/2015  CLINICAL DATA:  Hypotension EXAM: PORTABLE CHEST 1 VIEW COMPARISON:  08/16/2014 FINDINGS: Chronic cardiomegaly. The patient is status post CABG. Unchanged appearance of dual-chamber pacer leads from the left. EKG leads create extensive artifact over the chest. Low volumes. There is no edema, consolidation, effusion, or pneumothorax. IMPRESSION: Stable low volume chest.  Cardiomegaly without failure. Electronically Signed   By: Monte Fantasia M.D.   On: 03/29/2015 22:09       Imaging Review Dg Chest Port 1 View  03/29/2015  CLINICAL DATA:  Hypotension EXAM: PORTABLE CHEST 1 VIEW COMPARISON:  08/16/2014 FINDINGS: Chronic cardiomegaly. The patient is status post CABG. Unchanged appearance of dual-chamber pacer leads from the left. EKG leads create extensive artifact over the chest. Low volumes. There is no edema, consolidation, effusion, or pneumothorax. IMPRESSION: Stable low volume chest.  Cardiomegaly without failure. Electronically Signed   By: Monte Fantasia M.D.   On: 03/29/2015 22:09   I have personally reviewed and evaluated these images and lab results as part of my medical decision-making.   EKG  Interpretation   Date/Time:  Monday March 29 2015 20:30:04 EDT Ventricular Rate:  65 PR Interval:    QRS Duration: 207 QT Interval:  514 QTC Calculation: 534 R Axis:   -77 Text Interpretation:  Electronic ventricular pacemaker Confirmed by Ashok Cordia   MD, Lennette Bihari ([id number]) on 03/29/2015 8:53:23 PM      MDM   Iv ns bolus.   Continuous pulse ox and monitor. o2 [place].  Cxr. Ecg. Labs. Patient hypotensive.  Cultures sent, blood and urine.  ua positive for uti.  Rocephin iv.  bp improved w fluid bolus - total 30 cc/kg ordered.   Recheck pt, feels mildly improved.   CRITICAL CARE  RE hypotension, septic shock, uti.  Performed by: Mirna Mires Total critical care time: 36 minutes Critical care time was exclusive of separately billable procedures and treating other patients. Critical care was necessary to treat or prevent imminent or life-threatening deterioration. Critical care was time spent personally by me on the following activities: development of treatment plan with patient and/or surrogate  as well as nursing, discussions with consultants, evaluation of patient's response to treatment, examination of patient, obtaining history from patient or surrogate, ordering and performing treatments and interventions, ordering and review of laboratory studies, ordering and review of radiographic studies, pulse oximetry and re-evaluation of patient's condition.       Lajean Saver, MD 03/29/15 [phone]

## 2015-03-29 NOTE — ED Notes (Signed)
Dr. Ashok Cordia at the bedside.

## 2015-03-29 NOTE — ED Notes (Signed)
Order for one liter of normal saline at this time per Dr. Ashok Cordia.

## 2015-03-30 ENCOUNTER — Encounter (HOSPITAL_COMMUNITY): Payer: Self-pay | Admitting: *Deleted

## 2015-03-30 DIAGNOSIS — N390 Urinary tract infection, site not specified: Secondary | ICD-10-CM | POA: Diagnosis not present

## 2015-03-30 DIAGNOSIS — R6521 Severe sepsis with septic shock: Secondary | ICD-10-CM

## 2015-03-30 DIAGNOSIS — R55 Syncope and collapse: Secondary | ICD-10-CM | POA: Diagnosis present

## 2015-03-30 DIAGNOSIS — I9589 Other hypotension: Secondary | ICD-10-CM | POA: Diagnosis not present

## 2015-03-30 DIAGNOSIS — Z950 Presence of cardiac pacemaker: Secondary | ICD-10-CM

## 2015-03-30 DIAGNOSIS — E039 Hypothyroidism, unspecified: Secondary | ICD-10-CM | POA: Diagnosis not present

## 2015-03-30 DIAGNOSIS — A419 Sepsis, unspecified organism: Secondary | ICD-10-CM | POA: Insufficient documentation

## 2015-03-30 LAB — TROPONIN I
TROPONIN I: 0.03 ng/mL (ref <0.031)
Troponin I: <0.03 ng/mL (ref <0.031)

## 2015-03-30 LAB — BASIC METABOLIC PANEL
ANION GAP: 10 (ref 5–15)
BUN: 24 mg/dL — ABNORMAL HIGH (ref 6–20)
CALCIUM: 8.7 mg/dL — AB (ref 8.9–10.3)
CHLORIDE: 104 mmol/L (ref 101–111)
CO2: 27 mmol/L (ref 22–32)
CREATININE: 1.53 mg/dL — AB (ref 0.44–1.00)
GFR calc non Af Amer: 28 mL/min — ABNORMAL LOW (ref >60)
GFR, EST AFRICAN AMERICAN: 33 mL/min — AB (ref >60)
Glucose, Bld: 216 mg/dL — ABNORMAL HIGH (ref 65–99)
Potassium: 4.4 mmol/L (ref 3.5–5.1)
SODIUM: 141 mmol/L (ref 135–145)

## 2015-03-30 LAB — GLUCOSE, CAPILLARY
GLUCOSE-CAPILLARY: 193 mg/dL — AB (ref 65–99)
GLUCOSE-CAPILLARY: 139 mg/dL — AB (ref 65–99)
Glucose-Capillary: 158 mg/dL — ABNORMAL HIGH (ref 65–99)
Glucose-Capillary: 128 mg/dL — ABNORMAL HIGH (ref 65–99)
Glucose-Capillary: 121 mg/dL — ABNORMAL HIGH (ref 65–99)

## 2015-03-30 LAB — CBC
HCT: 40.8 % (ref 36.0–46.0)
Hemoglobin: 12.7 g/dL (ref 12.0–15.0)
MCH: 28.9 pg (ref 26.0–34.0)
MCHC: 31.1 g/dL (ref 30.0–36.0)
MCV: 92.7 fL (ref 78.0–100.0)
PLATELETS: 153 10*3/uL (ref 150–400)
RBC: 4.4 MIL/uL (ref 3.87–5.11)
RDW: 13.7 % (ref 11.5–15.5)
WBC: 7.4 10*3/uL (ref 4.0–10.5)

## 2015-03-30 LAB — I-STAT CG4 LACTIC ACID, ED: Lactic Acid, Venous: 1.74 mmol/L (ref 0.5–2.0)

## 2015-03-30 MED ORDER — ATROPINE SULFATE 1 % OP SOLN
1.0000 [drp] | Freq: Four times a day (QID) | OPHTHALMIC | Status: DC
  Administered 2015-03-30 – 2015-03-31 (×5): 1 [drp] via OPHTHALMIC
  Filled 2015-03-30: qty 2

## 2015-03-30 MED ORDER — TRAMADOL HCL 50 MG PO TABS
50.0000 mg | ORAL_TABLET | Freq: Four times a day (QID) | ORAL | Status: DC | PRN
  Administered 2015-03-30 (×3): 50 mg via ORAL
  Filled 2015-03-30 (×3): qty 1

## 2015-03-30 MED ORDER — FOLIC ACID 1 MG PO TABS
1.0000 mg | ORAL_TABLET | Freq: Every day | ORAL | Status: DC
  Administered 2015-03-30 – 2015-03-31 (×2): 1 mg via ORAL
  Filled 2015-03-30 (×2): qty 1

## 2015-03-30 MED ORDER — LEVALBUTEROL HCL 0.63 MG/3ML IN NEBU: 0.6300 mg | INHALATION_SOLUTION | Freq: Four times a day (QID) | RESPIRATORY_TRACT | Status: DC | PRN

## 2015-03-30 MED ORDER — ZOLPIDEM TARTRATE 5 MG PO TABS
2.5000 mg | ORAL_TABLET | Freq: Every day | ORAL | Status: DC
  Administered 2015-03-30: 2.5 mg via ORAL
  Filled 2015-03-30 (×2): qty 1

## 2015-03-30 MED ORDER — PANTOPRAZOLE SODIUM 40 MG PO TBEC
40.0000 mg | DELAYED_RELEASE_TABLET | Freq: Two times a day (BID) | ORAL | Status: DC
  Administered 2015-03-30 – 2015-03-31 (×3): 40 mg via ORAL
  Filled 2015-03-30 (×3): qty 1

## 2015-03-30 MED ORDER — INSULIN ASPART 100 UNIT/ML ~~LOC~~ SOLN
0.0000 [IU] | Freq: Three times a day (TID) | SUBCUTANEOUS | Status: DC
  Administered 2015-03-30: 2 [IU] via SUBCUTANEOUS
  Administered 2015-03-30 – 2015-03-31 (×3): 1 [IU] via SUBCUTANEOUS
  Administered 2015-03-31: 2 [IU] via SUBCUTANEOUS

## 2015-03-30 MED ORDER — NITROGLYCERIN 0.2 MG/HR TD PT24
0.2000 mg | MEDICATED_PATCH | Freq: Every day | TRANSDERMAL | Status: DC
  Administered 2015-03-30 – 2015-03-31 (×2): 0.2 mg via TRANSDERMAL
  Filled 2015-03-30 (×3): qty 1

## 2015-03-30 MED ORDER — ONDANSETRON HCL 4 MG PO TABS: 4.0000 mg | ORAL_TABLET | Freq: Four times a day (QID) | ORAL | Status: DC | PRN

## 2015-03-30 MED ORDER — RANOLAZINE ER 500 MG PO TB12
500.0000 mg | ORAL_TABLET | Freq: Two times a day (BID) | ORAL | Status: DC
  Administered 2015-03-30 – 2015-03-31 (×3): 500 mg via ORAL
  Filled 2015-03-30 (×3): qty 1

## 2015-03-30 MED ORDER — GLUCERNA SHAKE PO LIQD
237.0000 mL | Freq: Every day | ORAL | Status: DC
Start: 2015-03-30 — End: 2015-03-31
  Administered 2015-03-30 – 2015-03-31 (×2): 237 mL via ORAL

## 2015-03-30 MED ORDER — LORATADINE 10 MG PO TABS
10.0000 mg | ORAL_TABLET | Freq: Every day | ORAL | Status: DC
  Administered 2015-03-30 – 2015-03-31 (×2): 10 mg via ORAL
  Filled 2015-03-30 (×2): qty 1

## 2015-03-30 MED ORDER — HEPARIN SODIUM (PORCINE) 5000 UNIT/ML IJ SOLN: 5000.0000 [IU] | Freq: Three times a day (TID) | INTRAMUSCULAR | Status: DC

## 2015-03-30 MED ORDER — POLYVINYL ALCOHOL 1.4 % OP SOLN: 1.0000 [drp] | OPHTHALMIC | Status: DC | PRN

## 2015-03-30 MED ORDER — SODIUM CHLORIDE 0.9 % IV SOLN
INTRAVENOUS | Status: DC
  Administered 2015-03-30: 08:00:00 via INTRAVENOUS
  Administered 2015-03-30: 75 mL/h via INTRAVENOUS

## 2015-03-30 MED ORDER — ONDANSETRON HCL 4 MG/2ML IJ SOLN: 4.0000 mg | Freq: Four times a day (QID) | INTRAMUSCULAR | Status: DC | PRN

## 2015-03-30 MED ORDER — SODIUM CHLORIDE 0.9 % IV SOLN: INTRAVENOUS | Status: DC

## 2015-03-30 MED ORDER — DONEPEZIL HCL 5 MG PO TABS
5.0000 mg | ORAL_TABLET | Freq: Every day | ORAL | Status: DC
  Administered 2015-03-30: 5 mg via ORAL
  Filled 2015-03-30: qty 1

## 2015-03-30 MED ORDER — LEVOTHYROXINE SODIUM 50 MCG PO TABS
50.0000 ug | ORAL_TABLET | Freq: Every day | ORAL | Status: DC
  Administered 2015-03-30 – 2015-03-31 (×2): 50 ug via ORAL
  Filled 2015-03-30 (×2): qty 1

## 2015-03-30 MED ORDER — GABAPENTIN 100 MG PO CAPS
100.0000 mg | ORAL_CAPSULE | Freq: Two times a day (BID) | ORAL | Status: DC
  Administered 2015-03-30 – 2015-03-31 (×3): 100 mg via ORAL
  Filled 2015-03-30 (×3): qty 1

## 2015-03-30 MED ORDER — ASPIRIN 81 MG PO CHEW
81.0000 mg | CHEWABLE_TABLET | Freq: Every day | ORAL | Status: DC
  Administered 2015-03-30 – 2015-03-31 (×2): 81 mg via ORAL
  Filled 2015-03-30 (×2): qty 1

## 2015-03-30 MED ORDER — SODIUM CHLORIDE 0.9% FLUSH
3.0000 mL | Freq: Two times a day (BID) | INTRAVENOUS | Status: DC
  Administered 2015-03-30 – 2015-03-31 (×3): 3 mL via INTRAVENOUS

## 2015-03-30 NOTE — Telephone Encounter (Signed)
"  [name] [phone].  I'd like to talk with Dr. Lindi Adie or his nurse about therapeutics for my grandmother's care.  Some changes have been made but every form of human leukemia can be reversed with choline inhibitors.  Estrogen and aromatase inhibitors are detrimental or harmful."  Call transferred to [phone] voicemail as requested.

## 2015-03-30 NOTE — H&P (Signed)
[facility] History and Physical  [name] DXI:[id number] DOB: [birth year] DOA: 03/29/2015  Referring physician: Dr. Ashok Cordia PCP: [name], MD   Chief Complaint:  Nausea, vomiting, and generalized weakness  HPI: [name] is 82 year old female with a past medical history significant for HTN, CAD, legally blind, CKD stage III, s/p PM; who presents with generalized weakness after reports of nausea and vomiting. Symptoms started while eating dinner from [employer] tonight. During the meal she started to feel bad and felt sick on her stomach. Subsequently she vomited. Therapeutic it hurt to the bathroom where the daughter checked her blood pressure and it was low. When EMS arrived patient was still hypotensive. When EMS arrived they had tried to send the patient up but she had a syncopal event lasted approximately 30 seconds. Once the patient was lying down she felt much better. Patient notes that she took a laxative prior to having dinner and she does so weekly to help her bowels move. Associated symptoms included diaphoresis, dysuria, and some substernal chest pain that was intermittent. Patient denies any recent sick contacts, fever, chills, shortness of breath, or diarrhea. Patient claims that she's had some burning when she urinates for a while now.   Review of Systems  Constitutional: Positive for malaise/fatigue. Negative for fever and chills.  HENT: Positive for hearing loss. Negative for ear discharge.   Eyes: Negative for discharge.       Legally blind   Respiratory: Negative for shortness of breath.   Cardiovascular: Positive for chest pain and leg swelling.  Gastrointestinal: Positive for nausea, vomiting and abdominal pain.  Genitourinary: Positive for dysuria. Negative for hematuria and flank pain.  Musculoskeletal: Positive for joint pain. Negative for falls.  Skin: Negative for itching and rash.  Neurological: Positive for loss of consciousness and weakness. Negative  for focal weakness.  Endo/Heme/Allergies: Negative for environmental allergies. Does not bruise/bleed easily.  Psychiatric/Behavioral: Negative for hallucinations and substance abuse.        Past Medical History  Diagnosis Date  . Hypertension   . Coronary artery disease     a. 1994 s/p cabg;  b. 09/2013 MV: large, sev intensity, partially reversible inf, apical defect, prior inf/ap infarct w/ mild peri-infarct ischemia->Med Rx.  . Legally blind   . CKD (chronic kidney disease), stage III     a. iii - iv.  . Anemia   . Hypothyroid   . Gastric ulcer   . Hiatal hernia   . Dyspepsia   . UTI (lower urinary tract infection)   . H/O: GI bleed 12//13  . High cholesterol   . Chronic combined systolic and diastolic CHF (congestive heart failure) ([place])     a. 01/2014 Echo: EF @ least mod-sev reduced with HK of lat/apical, basal inf walls. basalpost AK, Gr 1DD.  Marland Kitchen DVT of upper extremity (deep vein thrombosis) ([place]) 06/13/2012    BUE  . Seasonal allergies   . Allergy to perfume   . Type II diabetes mellitus ([place])   . History of blood transfusion 2013  . GERD (gastroesophageal reflux disease)   . Daily headache   . Arthritis   . PAF (paroxysmal atrial fibrillation) ([facility])     a. CHA2DS2VASc = 7-->coumadin.  . 2Nd degree atrioventricular block     a. s/p MDT ADDR01 DC PPM (ser #: [id number]).     Past Surgical History  Procedure Laterality Date  . Esophagogastroduodenoscopy  12/22/2011    Procedure: ESOPHAGOGASTRODUODENOSCOPY (EGD);  Surgeon: Gatha Mayer, MD;  Location: [facility];  Service: Endoscopy;  Laterality: N/A;  . Cardioversion  06/06/06    successful  . Insert / replace / remove pacemaker  06/29/2006    Medtronic adapta  . Cardiac catheterization  10/25/92  . Cardiac catheterization  11/18/03    w/grafts 100%CX LAD 80 & 100%  . Cardiac catheterization  01/24/05    diffuse disease of native vessels  . Cardiac catheterization  06/06/06    severe native CAD  . Coronary  artery bypass graft  10/27/92    LIMA to LAD,SVG to LAD second diagonal,obtuse maraginal of the CX and posterior descendingbranch of the RCA  . Cataract extraction w/ intraocular lens  implant, bilateral Bilateral   . Refractive surgery Bilateral       Social History:  reports that she has never smoked. She has never used smokeless tobacco. She reports that she does not drink alcohol or use illicit drugs.   Allergies  Allergen Reactions  . Darvon     Unknown reaction   . Digoxin And Related Nausea Only  . Penicillins Other (See Comments)    Was told had allergy from childhood... Unknown reaction  . Percocet [Oxycodone-Acetaminophen] Other (See Comments)    confused  . Percodan [Oxycodone-Aspirin]   . Vicodin [Hydrocodone-Acetaminophen] Other (See Comments)    confused    Family History  Problem Relation Age of Onset  . Breast cancer Daughter   . Diabetes Son   . Diabetes Daughter   . Heart disease Father   . Diabetes Father   . Colon polyps Daughter   . Heart attack Brother   . Diabetes Brother   . Stroke Sister       Prior to Admission medications   Medication Sig Start Date End Date Taking? Authorizing Provider  aspirin 81 MG chewable tablet Chew 81 mg by mouth daily.   Yes Historical Provider, MD  atropine 1 % ophthalmic solution Place 1 drop into the right eye 4 (four) times daily.  10/29/13  Yes Historical Provider, MD  donepezil (ARICEPT) 5 MG tablet Take 5 mg by mouth at bedtime.  07/13/14  Yes Historical Provider, MD  feeding supplement, GLUCERNA SHAKE, (GLUCERNA SHAKE) LIQD Take 237 mLs by mouth daily.    Yes Historical Provider, MD  folic acid (FOLVITE) 1 MG tablet Take 1 tablet (1 mg total) by mouth daily. 01/19/14  Yes Mihai Croitoru, MD  furosemide (LASIX) 40 MG tablet Take 1 tablet by mouth  daily as needed for edema 12/04/14  Yes Mihai Croitoru, MD  gabapentin (NEURONTIN) 100 MG capsule Take 100 mg by mouth 2 (two) times daily.    Yes Historical Provider, MD   glipiZIDE (GLUCOTROL) 5 MG tablet TAKE ONE-HALF TABLET BY  MOUTH DAILY ONLY IF BLOOD  SUGAR IS OVER 200 12/14/14  Yes Elayne Snare, MD  levothyroxine (SYNTHROID) 50 MCG tablet take 1 tablet by mouth once daily BEFORE BREAKFAST 10/29/14  Yes Elayne Snare, MD  linagliptin (TRADJENTA) 5 MG TABS tablet Take 1 tablet (5 mg total) by mouth daily. 09/30/14  Yes Elayne Snare, MD  loratadine (CLARITIN) 10 MG tablet Take 10 mg by mouth daily.   Yes Historical Provider, MD  meclizine (ANTIVERT) 25 MG tablet Take 25 mg by mouth 3 (three) times daily as needed for dizziness.   Yes Historical Provider, MD  nitroGLYCERIN (NITRODUR - DOSED IN MG/24 HR) 0.2 mg/hr patch Place 1 patch (0.2 mg total) onto the skin daily. 12/21/14  Yes Sanda Klein, MD  omeprazole ([place])  20 MG capsule Take 1 capsule by mouth  daily 07/14/14  Yes Mihai Croitoru, MD  omeprazole (PRILOSEC) 20 MG capsule Take 1 capsule (20 mg total) by mouth 2 (two) times daily before a meal. 01/20/15  Yes Amy S Esterwood, PA-C  Polyvinyl Alcohol-Povidone (REFRESH OP) Place 1 drop into both eyes daily as needed (dry eyes).    Yes Historical Provider, MD  potassium chloride (K-DUR,KLOR-CON) 10 MEQ tablet Take 1 tablet by mouth two  times daily 12/04/14  Yes Mihai Croitoru, MD  ranolazine ([name]) 500 MG 12 hr tablet Take 1 tablet (500 mg total) by mouth 2 (two) times daily. 02/22/15  Yes Mihai Croitoru, MD  rosuvastatin (CRESTOR) 10 MG tablet Take 1 tablet (10 mg total) by mouth 3 (three) times a week. WCB:[id number] Exp: 8-18 Patient taking differently: Take 10 mg by mouth every Monday, Wednesday, and Friday.  02/18/14  Yes Luke K Kilroy, PA-C  SYNTHROID 50 MCG tablet take 1 tablet by mouth once daily BEFORE BREAKFAST 02/19/15  Yes Elayne Snare, MD  traMADol (ULTRAM) 50 MG tablet Take 1 tablet (50 mg total) by mouth every 6 (six) hours as needed for moderate pain. 08/22/14  Yes Orson Eva, MD  Vitamin D, Ergocalciferol, (DRISDOL) 50000 UNITS CAPS capsule Take 1,000 Units by  mouth daily.    Yes Historical Provider, MD  warfarin (COUMADIN) 3 MG tablet Take 1 to 1.5 tablets by mouth daily or as directed by coumadin clinic Patient taking differently: Take 3 mg by mouth See admin instructions. Take 1 and 1/2 tablets on Monday, Wednesday and Friday then take 1 tablet all the other days 03/08/15  Yes Mihai Croitoru, MD  zolpidem (AMBIEN) 5 MG tablet Take 0.5 tablets (2.5 mg total) by mouth at bedtime. sleep 08/22/14  Yes Orson Eva, MD  Blood Glucose Monitoring Suppl (ONE TOUCH ULTRA 2) w/Device KIT Use to check blood sugar 2 times per day dx code E11.65 02/23/15   Elayne Snare, MD  glucose blood (ONE TOUCH ULTRA TEST) test strip Use as instructed to check blood sugar 2 times per day dx code E11.65 02/23/15   Elayne Snare, MD  imipenem-cilastatin 250 mg in sodium chloride 0.9 % 100 mL Inject 250 mg into the vein every 6 (six) hours. Stop after last dose on 08/31/14 Patient not taking: Reported on 03/29/2015 08/22/14   Orson Eva, MD  [facility] DELICA LANCETS 51V MISC Use to check blood sugar 2 times per day dx code E11.65 02/23/15   Elayne Snare, MD     Physical Exam: Filed Vitals:   03/29/15 2345 03/30/15 0000 03/30/15 0030 03/30/15 0045  BP: 112/70 116/66 111/70 121/70  Pulse: 65 64 64 64  Temp:      TempSrc:      Resp: '18 13 14 12  '$ SpO2: 95% 93% 98% 97%     Constitutional: Vital signs reviewed. Patient is elderly female who appears to be alert and oriented 3 at this time Head: Normocephalic and atraumatic  Ear: TM normal bilaterally  Mouth: no erythema or exudates, MMM  Eyes: PERRL, EOMI, conjunctivae normal, No scleral icterus.  Neck: Supple, Trachea midline normal ROM, No JVD, mass, thyromegaly, or carotid bruit present.  Cardiovascular: RRR, S1 normal, S2 normal, no MRG, pulses symmetric and intact bilaterally  Pulmonary/Chest: CTAB, no wheezes, rales, or rhonchi  Abdominal: Soft. Non-tender, non-distended, bowel sounds are normal, no masses, organomegaly, or guarding present.   GU: no CVA tenderness Musculoskeletal: No joint deformities, erythema, or stiffness, ROM full and no  nontender Ext: Trace lower extremity edema and no cyanosis, pulses palpable bilaterally (DP and PT)  Hematology: no cervical, inginal, or axillary adenopathy.  Neurological: A&O x3, Strenght is normal and symmetric bilaterally, cranial nerve II-XII are grossly intact, no focal motor deficit, sensory intact to light touch bilaterally.  Skin: Warm, dry and intact. No rash, cyanosis, or clubbing.  Psychiatric: Normal mood and affect. speech and behavior is normal. Judgment and thought content normal. Cognition and memory are normal.      Data Review   Micro Results No results found for this or any previous visit (from the past 240 hour(s)).  Radiology Reports Dg Chest Port 1 View  03/29/2015  CLINICAL DATA:  Hypotension EXAM: PORTABLE CHEST 1 VIEW COMPARISON:  08/16/2014 FINDINGS: Chronic cardiomegaly. The patient is status post CABG. Unchanged appearance of dual-chamber pacer leads from the left. EKG leads create extensive artifact over the chest. Low volumes. There is no edema, consolidation, effusion, or pneumothorax. IMPRESSION: Stable low volume chest.  Cardiomegaly without failure. Electronically Signed   By: Monte Fantasia M.D.   On: 03/29/2015 22:09     CBC  Recent Labs Lab 03/29/15 2135  WBC 5.8  HGB 12.2  HCT 38.1  PLT 143*  MCV 92.0  MCH 29.5  MCHC 32.0  RDW 13.8    Chemistries   Recent Labs Lab 03/29/15 2135  NA 139  K 4.6  CL 101  CO2 25  GLUCOSE 207*  BUN 24*  CREATININE 1.83*  CALCIUM 9.3   ------------------------------------------------------------------------------------------------------------------ CrCl cannot be calculated (Unknown ideal weight.). ------------------------------------------------------------------------------------------------------------------ No results for input(s): HGBA1C in the last 72  hours. ------------------------------------------------------------------------------------------------------------------ No results for input(s): CHOL, HDL, LDLCALC, TRIG, CHOLHDL, LDLDIRECT in the last 72 hours. ------------------------------------------------------------------------------------------------------------------ No results for input(s): TSH, T4TOTAL, T3FREE, THYROIDAB in the last 72 hours.  Invalid input(s): FREET3 ------------------------------------------------------------------------------------------------------------------ No results for input(s): VITAMINB12, FOLATE, FERRITIN, TIBC, IRON, RETICCTPCT in the last 72 hours.  Coagulation profile  Recent Labs Lab 03/25/15 03/29/15 2135  INR 2.0 3.00*    No results for input(s): DDIMER in the last 72 hours.  Cardiac Enzymes  Recent Labs Lab 03/29/15 2350  TROPONINI 0.03   ------------------------------------------------------------------------------------------------------------------ Invalid input(s): POCBNP   CBG:  Recent Labs Lab 03/29/15 2051  GLUCAP 188*       EKG: Independently reviewed. Electronically ventricular lead paced   Assessment/Plan Syncope: Acute. Likely secondary to dehydration - Admit to a telemetry bed - Recheck orthostatic vital signs in a.m.  Hypotension: Acute. Patient's initial blood pressure 70/50 on arrival. Patient was given 2500 ml of normal saline IV fluids in the ED. Improvement of blood pressure within normal limits thereafter. - Continue to monitor - IV fluids of normal saline at 20m/hr  Acute urinary tract infection: Patient with complaints of dysuria for some unknown time. Urinalysis significant for many bacteria. - Follow-up urine culture - Continue empiric antibiotics of Rocephin  Nausea vomiting - Zofran as needed  Acute renal failure on chronic kidney disease: Previous baseline creatinine between 1 and 1.5  per review of previous admissions. On  presentation today patient's creatinine elevated at 1.83, and BMP was elevated at 24. - Recheck BMP in a.m. - Gentle IV fluids of normal saline secondary to history of CHF - Investigate further if warranted with renal ultrasound and/or urine studies    Chest pain: Patient's initial troponin was negative. EKG showing paced rhythm. -Trend troponins   Diabetes mellitus type 2 - Held glipizide and Trajenta  Gait disturbance: family notes that this difficult  for the patient to ambulate and need DP to assist as patient will sometimes  - Physical therapy to eval and treat    S/p pacemaker: Family notes that the patient scheduled to have it rechecked at the end of the month.  - Continue to monitor   Heparin DVT prophylaxis  Code Status:   full Family Communication: bedside Disposition Plan: admit   Total time spent 55 minutes.Greater than 50% of this time was spent in counseling, explanation of diagnosis, planning of further management, and coordination of care  [facility] Pager [phone]  If 7PM-7AM, please contact night-coverage [email or web address] Password [email or web address] 03/30/2015, 1:07 AM

## 2015-03-30 NOTE — Progress Notes (Signed)
Initial Nutrition Assessment  DOCUMENTATION CODES:   Obesity unspecified  INTERVENTION:  Continue Glucerna Shake po once daily, each supplement provides 220 kcal and 10 grams of protein.  Encourage adequate PO intake.   NUTRITION DIAGNOSIS:   Increased nutrient needs related to acute illness as evidenced by estimated needs.  GOAL:   Patient will meet greater than or equal to 90% of their needs  MONITOR:   PO intake, Supplement acceptance, Weight trends, Labs, I & O's  REASON FOR ASSESSMENT:   Malnutrition Screening Tool    ASSESSMENT:   82 year old female with a past medical history significant for HTN, CAD, legally blind, CKD stage III, s/p PM; who presents with generalized weakness after reports of nausea and vomiting.  Pt reports appetite is fine. Meal completion has been 50%. Pt reports eating well PTA with consumption of at least 2 meals a day with a Glucerna shake once daily at supper time. Weight has been stable. Pt currently has Glucerna Shake ordered and has been consuming them. RD to continue with current orders.  Pt with no observed significant fat or muscle mass loss.   Labs and medications reviewed.   Diet Order:  Diet heart healthy/carb modified Room service appropriate?: Yes; Fluid consistency:: Thin  Skin:  Reviewed, no issues  Last BM:  3/14  Height:   Ht Readings from Last 1 Encounters:  01/20/15 5\' 4"  (1.626 m)    Weight:   Wt Readings from Last 1 Encounters:  03/30/15 187 lb (84.823 kg)    Ideal Body Weight:  54.5 kg  BMI:  Body mass index is 32.08 kg/(m^2).  Estimated Nutritional Needs:   Kcal:  1650-1850  Protein:  70-85 grams  Fluid:  1.6 - 1.8 L/day  EDUCATION NEEDS:   No education needs identified at this time  Corrin Parker, MS, RD, LDN Pager # [phone] After hours/ weekend pager # [phone]

## 2015-03-30 NOTE — Progress Notes (Signed)
Patient admitted after midnight- please see H&P.  Patient does very little walking at home,  Has caregivers.  Await orthos.  Await culture.  Home health PT at home D/c 1-2 days  Eulogio Bear DO

## 2015-03-30 NOTE — Evaluation (Signed)
Physical Therapy Evaluation Patient Details Name: [name] MRN: [id number] DOB: [birth year] Today's Date: 03/30/2015   History of Present Illness  [name] is 82 year old female with a past medical history significant for HTN, CAD, legally blind, CKD stage III, s/p PM; who presents with generalized weakness after reports of nausea and vomiting.   Clinical Impression  Pt admitted with above diagnosis. Pt currently with functional limitations due to the deficits listed below (see PT Problem List). Pt sedentary at baseline. Pt has 24/7 assist and all equip recommended.  Pt will benefit from skilled PT to increase their independence and safety with mobility to allow discharge to the venue listed below.       Follow Up Recommendations Home health PT;Supervision/Assistance - 24 hour    Equipment Recommendations  None recommended by PT    Recommendations for Other Services       Precautions / Restrictions Precautions Precautions: Fall Precaution Comments: legally blind Restrictions Weight Bearing Restrictions: No      Mobility  Bed Mobility Overal bed mobility: +2 for physical assistance;Needs Assistance Bed Mobility: Supine to Sit     Supine to sit: Mod assist;+2 for physical assistance     General bed mobility comments: with v/c's pt initiated transfer but needed assist for trunk elevation and to bring LEs completely off EOB  Transfers Overall transfer level: Needs assistance Equipment used: 2 person hand held assist (lift with gait belt and bed pad) Transfers: Sit to/from Stand;Stand Pivot Transfers Sit to Stand: Mod assist;+2 physical assistance;Min assist Stand pivot transfers: Mod assist;Min assist;+2 physical assistance       General transfer comment: pt able to take steps to chair but quickly fatigues. pt reports "i can't use those walker things"  Ambulation/Gait                Stairs            Wheelchair Mobility    Modified Rankin  (Stroke Patients Only)       Balance Overall balance assessment: Needs assistance Sitting-balance support: Feet supported;Bilateral upper extremity supported Sitting balance-Leahy Scale: Poor Sitting balance - Comments: pt able to sit x 7 min but does require support of atleast unilateral UE    Standing balance support: Bilateral upper extremity supported Standing balance-Leahy Scale: Poor Standing balance comment: relies on physical assist due to fear of falling                             Pertinent Vitals/Pain Pain Assessment: No/denies pain    Home Living Family/patient expects to be discharged to:: Private residence Living Arrangements: Children (daughter and has caregiver) Available Help at Discharge: Family;Personal care attendant Type of Home: House Home Access: Ramped entrance     Home Layout: One level Home Equipment: Environmental consultant - 2 wheels;Bedside commode;Wheelchair - manual;Hospital bed Additional Comments: per elderly caregiver pt stays in bed all day with exception of 2x/wk when HHPT. pt;s daughter does get her up for church and to go out to eat but most of the time stays in bed    Prior Function Level of Independence: Needs assistance   Gait / Transfers Assistance Needed: min/modA, only amb with HHPT and doesn't use a RW  ADL's / Homemaking Assistance Needed: wears adult briefs with total assist for pericare and max assist for bathing and dressing        Hand Dominance   Dominant Hand: Right    Extremity/Trunk Assessment  Upper Extremity Assessment: Generalized weakness           Lower Extremity Assessment: Generalized weakness      Cervical / Trunk Assessment: Normal  Communication   Communication: HOH  Cognition Arousal/Alertness: Awake/alert Behavior During Therapy: WFL for tasks assessed/performed Overall Cognitive Status: Within Functional Limits for tasks assessed                      General Comments       Exercises        Assessment/Plan    PT Assessment Patient needs continued PT services  PT Diagnosis Difficulty walking;Generalized weakness   PT Problem List Decreased strength;Decreased activity tolerance;Decreased balance;Decreased mobility;Decreased knowledge of use of DME  PT Treatment Interventions DME instruction;Gait training;Functional mobility training;Therapeutic activities;Therapeutic exercise;Balance training   PT Goals (Current goals can be found in the Care Plan section) Acute Rehab PT Goals Patient Stated Goal: home PT Goal Formulation: With patient Time For Goal Achievement: 04/06/15 Potential to Achieve Goals: Good    Frequency Min 2X/week   Barriers to discharge        Co-evaluation               End of Session Equipment Utilized During Treatment: Gait belt Activity Tolerance: Patient tolerated treatment well Patient left: in chair;with call bell/phone within reach;with chair alarm set;with restraints reapplied Nurse Communication: Mobility status    Functional Assessment Tool Used: clinical judgement Functional Limitation: Mobility: Walking and moving around Mobility: Walking and Moving Around Current Status [phone]): At least 20 percent but less than 40 percent impaired, limited or restricted Mobility: Walking and Moving Around Goal Status [phone]): At least 1 percent but less than 20 percent impaired, limited or restricted    Time: 1033-1102 PT Time Calculation (min) (ACUTE ONLY): 29 min   Charges:   PT Evaluation $PT Eval Moderate Complexity: 1 Procedure PT Treatments $Therapeutic Activity: 8-22 mins   PT G Codes:   PT G-Codes **NOT FOR INPATIENT CLASS** Functional Assessment Tool Used: clinical judgement Functional Limitation: Mobility: Walking and moving around Mobility: Walking and Moving Around Current Status [id number]): At least 20 percent but less than 40 percent impaired, limited or restricted Mobility: Walking and Moving Around Goal  Status [phone]): At least 1 percent but less than 20 percent impaired, limited or restricted    [name] 03/30/2015, 1:35 PM   [name], PT, DPT Pager #: [phone] Office #: [phone]

## 2015-03-30 NOTE — ED Notes (Signed)
Dr. Tamala Julian, hospitalist, at the bedside.

## 2015-03-31 DIAGNOSIS — R112 Nausea with vomiting, unspecified: Secondary | ICD-10-CM

## 2015-03-31 DIAGNOSIS — R55 Syncope and collapse: Secondary | ICD-10-CM | POA: Diagnosis not present

## 2015-03-31 DIAGNOSIS — N390 Urinary tract infection, site not specified: Secondary | ICD-10-CM | POA: Diagnosis not present

## 2015-03-31 DIAGNOSIS — I9589 Other hypotension: Secondary | ICD-10-CM | POA: Diagnosis not present

## 2015-03-31 LAB — CBC
HEMATOCRIT: 35.1 % — AB (ref 36.0–46.0)
HEMOGLOBIN: 11.6 g/dL — AB (ref 12.0–15.0)
MCH: 30.4 pg (ref 26.0–34.0)
MCHC: 33 g/dL (ref 30.0–36.0)
MCV: 92.1 fL (ref 78.0–100.0)
Platelets: 149 10*3/uL — ABNORMAL LOW (ref 150–400)
RBC: 3.81 MIL/uL — AB (ref 3.87–5.11)
RDW: 13.9 % (ref 11.5–15.5)
WBC: 6.2 10*3/uL (ref 4.0–10.5)

## 2015-03-31 LAB — URINE CULTURE: Culture: >=100000

## 2015-03-31 LAB — BASIC METABOLIC PANEL
ANION GAP: 7 (ref 5–15)
BUN: 22 mg/dL — AB (ref 6–20)
CHLORIDE: 105 mmol/L (ref 101–111)
CO2: 27 mmol/L (ref 22–32)
Calcium: 8.8 mg/dL — ABNORMAL LOW (ref 8.9–10.3)
Creatinine, Ser: 1.42 mg/dL — ABNORMAL HIGH (ref 0.44–1.00)
GFR calc Af Amer: 36 mL/min — ABNORMAL LOW (ref >60)
GFR calc non Af Amer: 31 mL/min — ABNORMAL LOW (ref >60)
GLUCOSE: 169 mg/dL — AB (ref 65–99)
POTASSIUM: 4.6 mmol/L (ref 3.5–5.1)
Sodium: 139 mmol/L (ref 135–145)

## 2015-03-31 LAB — GLUCOSE, CAPILLARY
Glucose-Capillary: 200 mg/dL — ABNORMAL HIGH (ref 65–99)
Glucose-Capillary: 139 mg/dL — ABNORMAL HIGH (ref 65–99)

## 2015-03-31 MED ORDER — FOSFOMYCIN TROMETHAMINE 3 G PO PACK
3.0000 g | PACK | Freq: Once | ORAL | Status: AC
Start: 2015-03-31 — End: 2015-03-31
  Administered 2015-03-31: 3 g via ORAL
  Filled 2015-03-31: qty 3

## 2015-03-31 MED ORDER — FUROSEMIDE 40 MG PO TABS
40.0000 mg | ORAL_TABLET | Freq: Every day | ORAL | Status: DC
  Administered 2015-03-31: 40 mg via ORAL
  Filled 2015-03-31: qty 1

## 2015-03-31 NOTE — Progress Notes (Signed)
Called to speak with patients daughter [name] regarding her concerns with patient being transferred to the discharge unit. Discussed with [name] that patient would remain monitored by an RN, and NT and would not be left in the hall way but in a patient room until she arrived to discharge her btwn 2-3pm. [name] reported that patient had not had a bath yet today. Informed [name] that the bath was scheduled and would occur in the next 10 minutes. Also informed [name] that there would be an RN available to review her mother's discharge instructions with her at discharge. [name] verbalized understanding and thanked me for explaining. Stated that the conversation made her day better. [name], [name]

## 2015-03-31 NOTE — Discharge Summary (Signed)
Physician Discharge Summary  [name] CHE:[id number] DOB: 06-14-1920 DOA: 03/29/2015  PCP: Carrie Blocker, MD  Admit date: 03/29/2015 Discharge date: 03/31/2015  Time spent: 35 minutes  Recommendations for Outpatient Follow-up:  1. Resume home health   Discharge Diagnoses:  Principal Problem:   Syncope Active Problems:   Hypotension   Pacemaker   Nausea with vomiting   UTI (lower urinary tract infection)   Hypothyroidism   Acute UTI   Discharge Condition: improved  Diet recommendation: diabetic  Filed Weights   03/30/15 0132 03/30/15 0845 03/30/15 2007  Weight: 85.1 kg (187 lb 9.8 oz) 84.823 kg (187 lb) 84.7 kg (186 lb 11.7 oz)    History of present illness:  [name] is 82 year old female with a past medical history significant for HTN, CAD, legally blind, CKD stage III, s/p PM; who presents with generalized weakness after reports of nausea and vomiting. Symptoms started while eating dinner from [employer] tonight. During the meal she started to feel bad and felt sick on her stomach. Subsequently she vomited. Therapeutic it hurt to the bathroom where the daughter checked her blood pressure and it was low. When EMS arrived patient was still hypotensive. When EMS arrived they had tried to send the patient up but she had a syncopal event lasted approximately 30 seconds. Once the patient was lying down she felt much better. Patient notes that she took a laxative prior to having dinner and she does so weekly to help her bowels move. Associated symptoms included diaphoresis, dysuria, and some substernal chest pain that was intermittent. Patient denies any recent sick contacts, fever, chills, shortness of breath, or diarrhea. Patient claims that she's had some burning when she urinates for a while now.  Hospital Course:  Syncope -? Due to UTI/dehydration -no further episodes -BP runs 536R systolic at home  UTI -h/o ESBL in 2016 -will give 1 dose fosfomycin today to  treat  N/V -resolved -eating well  Hypotension -reponded to IVF  Chronic systolic CHF: EF 35 % -d/c'd IVF -resume home lasix  H/o breast cancer   Procedures:    Consultations:  PT  Discharge Exam: Filed Vitals:   03/31/15 0523 03/31/15 0803  BP: 95/61 102/49  Pulse: 66 64  Temp: 99.6 F (37.6 C) 99 F (37.2 C)  Resp: 18 17    General: awake, pleasant, no complaints- no SOB, eating well Cardiovascular: rrr Respiratory: diminished at base  Discharge Instructions   Discharge Instructions    Diet Carb Modified    Complete by:  As directed      Discharge instructions    Complete by:  As directed   INR check 1 week Resume home health     Increase activity slowly    Complete by:  As directed           Current Discharge Medication List    CONTINUE these medications which have NOT CHANGED   Details  aspirin 81 MG chewable tablet Chew 81 mg by mouth daily.    atropine 1 % ophthalmic solution Place 1 drop into the right eye 4 (four) times daily.  Refills: 0    donepezil (ARICEPT) 5 MG tablet Take 5 mg by mouth at bedtime.  Refills: 0    feeding supplement, GLUCERNA SHAKE, (GLUCERNA SHAKE) LIQD Take 237 mLs by mouth daily.     folic acid (FOLVITE) 1 MG tablet Take 1 tablet (1 mg total) by mouth daily. Qty: 90 tablet, Refills: 3    furosemide (LASIX) 40  MG tablet Take 1 tablet by mouth  daily as needed for edema Qty: 90 tablet, Refills: 3    gabapentin (NEURONTIN) 100 MG capsule Take 100 mg by mouth 2 (two) times daily.     glipiZIDE (GLUCOTROL) 5 MG tablet TAKE ONE-HALF TABLET BY  MOUTH DAILY ONLY IF BLOOD  SUGAR IS OVER 200 Qty: 45 tablet, Refills: 1    linagliptin (TRADJENTA) 5 MG TABS tablet Take 1 tablet (5 mg total) by mouth daily. Qty: 30 tablet, Refills: 1    loratadine (CLARITIN) 10 MG tablet Take 10 mg by mouth daily.    meclizine (ANTIVERT) 25 MG tablet Take 25 mg by mouth 3 (three) times daily as needed for dizziness.     nitroGLYCERIN (NITRODUR - DOSED IN MG/24 HR) 0.2 mg/hr patch Place 1 patch (0.2 mg total) onto the skin daily. Qty: 30 patch, Refills: 6    omeprazole (PRILOSEC) 20 MG capsule Take 1 capsule (20 mg total) by mouth 2 (two) times daily before a meal. Qty: 60 capsule, Refills: 11    Polyvinyl Alcohol-Povidone (REFRESH OP) Place 1 drop into both eyes daily as needed (dry eyes).     potassium chloride (K-DUR,KLOR-CON) 10 MEQ tablet Take 1 tablet by mouth two  times daily Qty: 180 tablet, Refills: 3    ranolazine ([name]) 500 MG 12 hr tablet Take 1 tablet (500 mg total) by mouth 2 (two) times daily. Qty: 56 tablet, Refills: 0   Associated Diagnoses: Coronary artery disease involving native coronary artery of native heart with angina pectoris ([place]); Complete heart block ([facility])    SYNTHROID 50 MCG tablet take 1 tablet by mouth once daily BEFORE BREAKFAST Qty: 30 tablet, Refills: 3    traMADol (ULTRAM) 50 MG tablet Take 1 tablet (50 mg total) by mouth every 6 (six) hours as needed for moderate pain. Qty: 30 tablet, Refills: 0    Vitamin D, Ergocalciferol, (DRISDOL) 50000 UNITS CAPS capsule Take 1,000 Units by mouth daily.     warfarin (COUMADIN) 3 MG tablet Take 1 to 1.5 tablets by mouth daily or as directed by coumadin clinic Qty: 40 tablet, Refills: 3    zolpidem (AMBIEN) 5 MG tablet Take 0.5 tablets (2.5 mg total) by mouth at bedtime. sleep Qty: 30 tablet, Refills: 0    Blood Glucose Monitoring Suppl (ONE TOUCH ULTRA 2) w/Device KIT Use to check blood sugar 2 times per day dx code E11.65 Qty: 1 each, Refills: 0    glucose blood (ONE TOUCH ULTRA TEST) test strip Use as instructed to check blood sugar 2 times per day dx code E11.65 Qty: 100 each, Refills: 2    ONETOUCH DELICA LANCETS 35T MISC Use to check blood sugar 2 times per day dx code E11.65 Qty: 100 each, Refills: 2      STOP taking these medications     imipenem-cilastatin 250 mg in sodium chloride 0.9 % 100 mL         Allergies  Allergen Reactions  . Darvon     Unknown reaction   . Digoxin And Related Nausea Only  . Penicillins Other (See Comments)    Was told had allergy from childhood... Unknown reaction  . Percocet [Oxycodone-Acetaminophen] Other (See Comments)    confused  . Percodan [Oxycodone-Aspirin]   . Vicodin [Hydrocodone-Acetaminophen] Other (See Comments)    confused   Follow-up Information    Follow up with Carrie Blocker, MD In 1 week.   Specialty:  Internal Medicine   Contact information:  [place] [id number] [phone]        The results of significant diagnostics from this hospitalization (including imaging, microbiology, ancillary and laboratory) are listed below for reference.    Significant Diagnostic Studies: Dg Chest Port 1 View  03/29/2015  CLINICAL DATA:  Hypotension EXAM: PORTABLE CHEST 1 VIEW COMPARISON:  08/16/2014 FINDINGS: Chronic cardiomegaly. The patient is status post CABG. Unchanged appearance of dual-chamber pacer leads from the left. EKG leads create extensive artifact over the chest. Low volumes. There is no edema, consolidation, effusion, or pneumothorax. IMPRESSION: Stable low volume chest.  Cardiomegaly without failure. Electronically Signed   By: Monte Fantasia M.D.   On: 03/29/2015 22:09    Microbiology: Recent Results (from the past 240 hour(s))  Blood Culture (routine x 2)     Status: None (Preliminary result)   Collection Time: 03/29/15  9:15 PM  Result Value Ref Range Status   Specimen Description BLOOD RIGHT FOREARM  Final   Special Requests BOTTLES DRAWN AEROBIC AND ANAEROBIC 5CC EA  Final   Culture NO GROWTH < 24 HOURS  Final   Report Status PENDING  Incomplete  Blood Culture (routine x 2)     Status: None (Preliminary result)   Collection Time: 03/29/15  9:35 PM  Result Value Ref Range Status   Specimen Description BLOOD RIGHT HAND  Final   Special Requests BOTTLES DRAWN AEROBIC ONLY [place]  Final   Culture  NO GROWTH < 24 HOURS  Final   Report Status PENDING  Incomplete     Labs: Basic Metabolic Panel:  Recent Labs Lab 03/29/15 2135 03/30/15 0309 03/31/15 0528  NA 139 141 139  K 4.6 4.4 4.6  CL 101 104 105  CO2 _0 GLUCOSE 207* 216* 169*  BUN 24* 24* 22*  CREATININE 1.83* 1.53* 1.42*  CALCIUM 9.3 8.7* 8.8*   Liver Function Tests: No results for input(s): AST, ALT, ALKPHOS, BILITOT, PROT, ALBUMIN in the last 168 hours. No results for input(s): LIPASE, AMYLASE in the last 168 hours. No results for input(s): AMMONIA in the last 168 hours. CBC:  Recent Labs Lab 03/29/15 2135 03/30/15 0309 03/31/15 0528  WBC 5.8 7.4 6.2  HGB 12.2 12.7 11.6*  HCT 38.1 40.8 35.1*  MCV 92.0 92.7 92.1  PLT 143* 153 149*   Cardiac Enzymes:  Recent Labs Lab 03/29/15 2350 03/30/15 0309 03/30/15 0541 03/30/15 0939  TROPONINI 0.03 <0.03 <0.03 <0.03   BNP: BNP (last 3 results)  Recent Labs  05/24/14 0145 07/21/14 1620  BNP 169.5* 261.6*    ProBNP (last 3 results) No results for input(s): PROBNP in the last 8760 hours.  CBG:  Recent Labs Lab 03/30/15 0806 03/30/15 1156 03/30/15 1702 03/30/15 2004 03/31/15 0802  GLUCAP 158* 128* 121* 139* 200*       Signed:  Danil Wedge U Monnica Saltsman DO.  [facility] 03/31/2015, 9:53 AM

## 2015-03-31 NOTE — Care Management Note (Signed)
Case Management Note  Patient Details  Name: [name] MRN: [id number] Date of Birth: Mar 09, 1920  Subjective/Objective:               CM following for progression and d/c planning.     Action/Plan: 03/31/2015 Met with pt and pt sitter, re d/c needs, phone number for daughter provided , however this CM has been unable to reach the pt daughter re [facility] and DME needs. Per pt sitter this pt has a wheelchair at home so will cancel order for wheelchair until we have spoke with the daughter re pt needs. Per chart HH is provided by [facility], await daughter approval to resume [facility] services.  Spoke with daughter, this pt is active with [facility] and she wishes to continue services HHPT and HHRN with that agency, she also states that the pt wheelchair is broken and very old. [facility] notified and will verify that pt is eligible for another wheelchair.   Expected Discharge Date:  03/31/2015               Expected Discharge Plan:  [place]  In-House Referral:  NA  Discharge planning Services  CM Consult  Post Acute Care Choice:  Home Health Choice offered to:  Patient  DME Arranged:    DME Agency:     HH Arranged:  PT, RN [place] Agency:  [facility]  Status of Service:  In process, will continue to follow  Medicare Important Message Given:    Date Medicare IM Given:    Medicare IM give by:    Date Additional Medicare IM Given:    Additional Medicare Important Message give by:     If discussed at [place] of Stay Meetings, dates discussed:    Additional Comments:  Adron Bene, RN 03/31/2015, 11:12 AM

## 2015-03-31 NOTE — Progress Notes (Signed)
Report called to 3W. I placed her discharge papers on the bed.

## 2015-03-31 NOTE — Progress Notes (Signed)
Discharge orders given to daughter with understanding

## 2015-03-31 NOTE — Progress Notes (Signed)
[name] to be D/C'd Home per MD order.  Discussed prescriptions and follow up appointments with the patient. Prescriptions given to patient, medication list explained in detail. Pt verbalized understanding.    Medication List    STOP taking these medications        imipenem-cilastatin 250 mg in sodium chloride 0.9 % 100 mL      TAKE these medications        aspirin 81 MG chewable tablet  Chew 81 mg by mouth daily.     atropine 1 % ophthalmic solution  Place 1 drop into the right eye 4 (four) times daily.     donepezil 5 MG tablet  Commonly known as:  ARICEPT  Take 5 mg by mouth at bedtime.     feeding supplement (GLUCERNA SHAKE) Liqd  Take 237 mLs by mouth daily.     folic acid 1 MG tablet  Commonly known as:  FOLVITE  Take 1 tablet (1 mg total) by mouth daily.     furosemide 40 MG tablet  Commonly known as:  LASIX  Take 1 tablet by mouth  daily as needed for edema     gabapentin 100 MG capsule  Commonly known as:  NEURONTIN  Take 100 mg by mouth 2 (two) times daily.     glipiZIDE 5 MG tablet  Commonly known as:  GLUCOTROL  TAKE ONE-HALF TABLET BY  MOUTH DAILY ONLY IF BLOOD  SUGAR IS OVER 200     glucose blood test strip  Commonly known as:  ONE TOUCH ULTRA TEST  Use as instructed to check blood sugar 2 times per day dx code E11.65     linagliptin 5 MG Tabs tablet  Commonly known as:  TRADJENTA  Take 1 tablet (5 mg total) by mouth daily.     loratadine 10 MG tablet  Commonly known as:  CLARITIN  Take 10 mg by mouth daily.     meclizine 25 MG tablet  Commonly known as:  ANTIVERT  Take 25 mg by mouth 3 (three) times daily as needed for dizziness.     nitroGLYCERIN 0.2 mg/hr patch  Commonly known as:  NITRODUR - Dosed in mg/24 hr  Place 1 patch (0.2 mg total) onto the skin daily.     omeprazole 20 MG capsule  Commonly known as:  PRILOSEC  Take 1 capsule (20 mg total) by mouth 2 (two) times daily before a meal.     ONE TOUCH ULTRA 2 w/Device  Kit  Use to check blood sugar 2 times per day dx code M03.49     [facility] DELICA LANCETS 17H Misc  Use to check blood sugar 2 times per day dx code E11.65     potassium chloride 10 MEQ tablet  Commonly known as:  K-DUR,KLOR-CON  Take 1 tablet by mouth two  times daily     ranolazine 500 MG 12 hr tablet  Commonly known as:  [name]  Take 1 tablet (500 mg total) by mouth 2 (two) times daily.     REFRESH OP  Place 1 drop into both eyes daily as needed (dry eyes).     SYNTHROID 50 MCG tablet  Generic drug:  levothyroxine  take 1 tablet by mouth once daily BEFORE BREAKFAST     traMADol 50 MG tablet  Commonly known as:  ULTRAM  Take 1 tablet (50 mg total) by mouth every 6 (six) hours as needed for moderate pain.     Vitamin D (Ergocalciferol) 50000 units  Caps capsule  Commonly known as:  DRISDOL  Take 1,000 Units by mouth daily.     warfarin 3 MG tablet  Commonly known as:  COUMADIN  Take 1 to 1.5 tablets by mouth daily or as directed by coumadin clinic     zolpidem 5 MG tablet  Commonly known as:  AMBIEN  Take 0.5 tablets (2.5 mg total) by mouth at bedtime. sleep        Filed Vitals:   03/31/15 0523 03/31/15 0803  BP: 95/61 102/49  Pulse: 66 64  Temp: 99.6 F (37.6 C) 99 F (37.2 C)  Resp: 18 17    Skin clean, dry and intact without evidence of skin break down, no evidence of skin tears noted. IV catheter discontinued intact. Site without signs and symptoms of complications. Dressing and pressure applied. Pt denies pain at this time. No complaints noted. Will be transferred to 3W to wait for family to arrive  An After Visit Summary was printed and given to the patient. Patient escorted via [place], and D/C home via private auto.  Retta Mac BSN, RN

## 2015-04-01 ENCOUNTER — Telehealth: Payer: Self-pay | Admitting: Pharmacist Clinician (PhC)/ Clinical Pharmacy Specialist

## 2015-04-01 NOTE — Telephone Encounter (Signed)
Pt d/c hospital on 3/15, INR was 3.0.  Advised no change in medication, will repeat INR in 1 week

## 2015-04-03 LAB — CULTURE, BLOOD (ROUTINE X 2)
Culture: NO GROWTH
Culture: NO GROWTH

## 2015-04-08 ENCOUNTER — Ambulatory Visit (INDEPENDENT_AMBULATORY_CARE_PROVIDER_SITE_OTHER): Payer: PPO | Admitting: Pharmacist Clinician (PhC)/ Clinical Pharmacy Specialist

## 2015-04-08 DIAGNOSIS — Z7901 Long term (current) use of anticoagulants: Secondary | ICD-10-CM

## 2015-04-08 DIAGNOSIS — I824Z9 Acute embolism and thrombosis of unspecified deep veins of unspecified distal lower extremity: Secondary | ICD-10-CM

## 2015-04-08 LAB — POCT INR: INR: 2.2

## 2015-04-14 ENCOUNTER — Ambulatory Visit: Payer: Medicare Other | Admitting: Endocrinology

## 2015-04-14 ENCOUNTER — Telehealth: Payer: Self-pay | Admitting: Endocrinology

## 2015-04-14 NOTE — Telephone Encounter (Signed)
Follow up necessary. Contact patient and schedule visit in _10__ days.

## 2015-04-14 NOTE — Telephone Encounter (Signed)
Patient no showed today's appt. Please advise on how to follow up. °A. No follow up necessary. °B. Follow up urgent. Contact patient immediately. °C. Follow up necessary. Contact patient and schedule visit in ___ days. °D. Follow up advised. Contact patient and schedule visit in ____weeks. ° °

## 2015-04-15 ENCOUNTER — Encounter: Payer: Self-pay | Admitting: *Deleted

## 2015-04-15 NOTE — Telephone Encounter (Signed)
Letter mailed

## 2015-04-29 ENCOUNTER — Ambulatory Visit (INDEPENDENT_AMBULATORY_CARE_PROVIDER_SITE_OTHER): Payer: PPO | Admitting: Pharmacist Clinician (PhC)/ Clinical Pharmacy Specialist

## 2015-04-29 DIAGNOSIS — I824Z9 Acute embolism and thrombosis of unspecified deep veins of unspecified distal lower extremity: Secondary | ICD-10-CM

## 2015-04-29 DIAGNOSIS — Z7901 Long term (current) use of anticoagulants: Secondary | ICD-10-CM

## 2015-04-29 LAB — POCT INR: INR: 2.2

## 2015-05-04 ENCOUNTER — Ambulatory Visit: Payer: PPO | Admitting: Physician Assistant

## 2015-05-06 ENCOUNTER — Encounter: Payer: Self-pay | Admitting: Physician Assistant

## 2015-05-06 ENCOUNTER — Ambulatory Visit (INDEPENDENT_AMBULATORY_CARE_PROVIDER_SITE_OTHER): Payer: PPO | Admitting: Physician Assistant

## 2015-05-06 VITALS — BP 100/60 | HR 70 | Ht 64.0 in

## 2015-05-06 DIAGNOSIS — R1013 Epigastric pain: Secondary | ICD-10-CM

## 2015-05-06 DIAGNOSIS — K5901 Slow transit constipation: Secondary | ICD-10-CM

## 2015-05-06 NOTE — Progress Notes (Signed)
Patient ID: [name], female   DOB: 08/03/1920, 82 y.o.   MRN: [id number]   Subjective:    Patient ID: [name], female    DOB: Dec 08, 1920, 82 y.o.   MRN: [id number]  HPI [name] is a pleasant 82 year old African-American female former patient of Dr. Deatra Torres now established with Dr. Silverio Torres . She has had chronic GI complaints for years. She does have history of a gastric ulcer in 2013. Also with history of recent diagnosis of breast cancer now being on maintained on a Rheumatrex. She has atrial fibrillation, adult-onset diabetes mellitus, cardiomyopathy with EF of 30-35% and is status post pacemaker placement. She is status post CABG in 1994 and is maintained on chronic anticoagulation with Coumadin. She was last seen in the office in January 2017 with dyspeptic complaints and at that time Prilosec was increased to twice a day. She comes in today with her daughter with complaints of increased bloating and occasional nausea and burning abdominal discomfort. She also describes a twitching sensation in her abdomen but has significant problems with diabetic neuropathy. She says she was told to drink tonic water by Dr. Deatra Torres years ago but does not feel that helps. She also has chronic constipation and has maintained a regimen with mag citrate once a week for many years. Primary complaint today seems to be bloating discomfort and a burning sensation in her abdomen mid and lower. She also has dysuria and is being worked up for UTI by her PCP.  Review of Systems  Pertinent positive and negative review of systems were noted in the above HPI section.  All other review of systems was otherwise negative.  Outpatient Encounter Prescriptions as of 05/06/2015  Medication Sig  . aspirin 81 MG chewable tablet Chew 81 mg by mouth daily.  Marland Kitchen atropine 1 % ophthalmic solution Place 1 drop into the right eye 4 (four) times daily.   . Blood Glucose Monitoring Suppl (ONE TOUCH ULTRA 2) w/Device KIT Use  to check blood sugar 2 times per day dx code E11.65  . donepezil (ARICEPT) 5 MG tablet Take 5 mg by mouth at bedtime.   . folic acid (FOLVITE) 1 MG tablet Take 1 tablet (1 mg total) by mouth daily.  . furosemide (LASIX) 40 MG tablet Take 1 tablet by mouth  daily as needed for edema  . gabapentin (NEURONTIN) 100 MG capsule Take 100 mg by mouth 2 (two) times daily.   Marland Kitchen glipiZIDE (GLUCOTROL) 5 MG tablet TAKE ONE-HALF TABLET BY  MOUTH DAILY ONLY IF BLOOD  SUGAR IS OVER 200  . glucose blood (ONE TOUCH ULTRA TEST) test strip Use as instructed to check blood sugar 2 times per day dx code E11.65  . linagliptin (TRADJENTA) 5 MG TABS tablet Take 1 tablet (5 mg total) by mouth daily.  Marland Kitchen loratadine (CLARITIN) 10 MG tablet Take 10 mg by mouth daily.  . meclizine (ANTIVERT) 25 MG tablet Take 25 mg by mouth 3 (three) times daily as needed for dizziness.  . nitroGLYCERIN (NITRODUR - DOSED IN MG/24 HR) 0.2 mg/hr patch Place 1 patch (0.2 mg total) onto the skin daily.  Marland Kitchen omeprazole (PRILOSEC) 20 MG capsule Take 1 capsule (20 mg total) by mouth 2 (two) times daily before a meal. (Patient taking differently: Take 20 mg by mouth daily. )  . ONETOUCH DELICA LANCETS 37J MISC Use to check blood sugar 2 times per day dx code E11.65  . OVER THE COUNTER MEDICATION intrex shakes, one daily  . Polyvinyl  Alcohol-Povidone (REFRESH OP) Place 1 drop into both eyes daily as needed (dry eyes).   . potassium chloride (K-DUR,KLOR-CON) 10 MEQ tablet Take 1 tablet by mouth two  times daily  . ranolazine ([name]) 500 MG 12 hr tablet Take 1 tablet (500 mg total) by mouth 2 (two) times daily.  Marland Kitchen SYNTHROID 50 MCG tablet take 1 tablet by mouth once daily BEFORE BREAKFAST  . traMADol (ULTRAM) 50 MG tablet Take 1 tablet (50 mg total) by mouth every 6 (six) hours as needed for moderate pain.  . Vitamin D, Ergocalciferol, (DRISDOL) 50000 UNITS CAPS capsule Take 1,000 Units by mouth daily.   Marland Kitchen warfarin (COUMADIN) 3 MG tablet Take 1 to 1.5  tablets by mouth daily or as directed by coumadin clinic (Patient taking differently: Take 3 mg by mouth See admin instructions. Take 1 and 1/2 tablets on Monday, Wednesday and Friday then take 1 tablet all the other days)  . zolpidem (AMBIEN) 5 MG tablet Take 0.5 tablets (2.5 mg total) by mouth at bedtime. sleep  . [DISCONTINUED] feeding supplement, GLUCERNA SHAKE, (GLUCERNA SHAKE) LIQD Take 237 mLs by mouth daily.    No facility-administered encounter medications on file as of 05/06/2015.   Allergies  Allergen Reactions  . Darvon     Unknown reaction   . Digoxin And Related Nausea Only  . Penicillins Other (See Comments)    Was told had allergy from childhood... Unknown reaction  . Percocet [Oxycodone-Acetaminophen] Other (See Comments)    confused  . Percodan [Oxycodone-Aspirin]   . Vicodin [Hydrocodone-Acetaminophen] Other (See Comments)    confused   Patient Active Problem List   Diagnosis Date Noted  . Septic shock ([place]) 03/30/2015  . Syncope 03/30/2015  . Acute UTI   . ESBL (extended spectrum beta-lactamase) producing bacteria infection   . Transaminitis   . Chronic combined systolic and diastolic CHF (congestive heart failure) ([place])   . Chronic atrial fibrillation ([place])   . Other specified hypothyroidism   . Diabetes type 2, uncontrolled ([place])   . Pressure ulcer 08/17/2014  . Blood poisoning ([place])   . Cancer of central portion of left female breast ([place]) 07/30/2014  . Periumbilical abdominal pain 06/26/2014  . Abnormal TSH 05/28/2014  . CKD (chronic kidney disease), stage III 05/28/2014  . Acute on chronic combined systolic and diastolic CHF (congestive heart failure) ([place])   . Acute respiratory failure with hypoxia ([place])   . Acute kidney injury ([place])   . SOB (shortness of breath) 05/24/2014  . UTI (urinary tract infection) 05/24/2014  . Abnormal LFTs (liver function tests)   . Epigastric pain   . Pancreatitis 02/04/2014  . Elevated LFTs   . Complete heart block  ([place]) 11/19/2013  . Acquired autoimmune hypothyroidism 10/24/2013  . Abnormal nuclear stress test, 08/2013 09/17/2013  . Arm pain, left 08/24/2013  . Abdominal pain, left lower quadrant 06/30/2013  . Dyspnea 02/23/2013  . PAF (paroxysmal atrial fibrillation) ([place]) 11/19/2012  . Sepsis ([place]) 11/18/2012  . DVT of upper extremity (deep vein thrombosis) ([place]) 11/18/2012  . Musculoskeletal chest pain 11/01/2012  . Diabetes mellitus type 2, uncontrolled ([place]) 07/21/2012  . Hypothyroidism 07/21/2012  . Chronic diastolic heart failure ([place]) 07/07/2012  . Long term current use of anticoagulant therapy 06/20/2012  . Acute pulmonary embolism ([place]) 06/18/2012  . DVT (deep venous thrombosis) ([place]) 06/13/2012  . Fever 02/22/2012  . Malnutrition ([place]) 01/01/2012  . Dyspepsia 12/30/2011  . UTI (lower urinary tract infection) 12/29/2011  . Chest pain 12/29/2011  .  Hiatal hernia 12/29/2011  . Gastric ulcer 12/22/2011  . Fatigue 12/21/2011  . Pain, foot, right, chronic 12/21/2011  . Anemia of chronic disease 12/19/2011  . Nausea with vomiting 12/19/2011  . Anorexia 12/19/2011  . Infection, staphylococcal 12/18/2011  . Bacterial UTI 12/18/2011  . Sepsis due to other specified Staphylococcus ([place]) 12/18/2011  . Hypertension 12/14/2011  . Hypotension 12/14/2011  . Acute on chronic renal failure ([place]) 12/14/2011  . Pacemaker 12/14/2011  . Hyperkalemia 12/14/2011  . Hyponatremia 12/14/2011  . Microcytic anemia 10/19/2011  . Abnormal weight loss 10/19/2011  . Dyspepsia and other specified disorders of function of stomach 10/19/2011  . Diabetes mellitus ([place]) 10/19/2011  . Coronary artery disease, CABG in 1994. Myoview abnormal Aug 2015- medical Rx 10/19/2011   Social History   Social History  . Marital Status: Married    Spouse Name: N/A  . Number of Children: 10  . Years of Education: N/A   Occupational History  .     Social History Main Topics  . Smoking status: Never Smoker   .  Smokeless tobacco: Never Used  . Alcohol Use: No  . Drug Use: No  . Sexual Activity: No   Other Topics Concern  . Not on file   Social History Narrative    Ms. Valdes's family history includes Breast cancer in her daughter; Colon polyps in her daughter; Diabetes in her brother, daughter, father, and son; Heart attack in her brother; Heart disease in her father; Stroke in her sister. There is no history of Colon cancer.      Objective:    Filed Vitals:   05/06/15 0947  BP: 100/60  Pulse: 70    Physical Exam  well-developed elderly African-American female in no acute distress, she is in a wheelchair accompanied by her daughter blood pressure 100/60 pulse 70 height 5 foot 4 not weighed today. HEENT ;nontraumatic, cephalic EOMI PERRLA sclera anicteric, Cardiovascular ;regular rate and rhythm with S1-S2 no murmur or gallop, Pulmonary; clear bilaterally, Abdomen; large soft focal tenderness no guarding or rebound no palpable mass or hepatosplenomegaly bowel sounds are present, Rectal; exam not done, Neuropsych ;mood and affect appropriate     Assessment & Plan:   #1 82 yo female With chronic dyspepsia and chronic functional constipation with complaints of abdominal bloating and midabdominal burning. Suspect symptoms secondary to functional dyspepsia, possible medication induced component i.e.Arimidex #2 adult-onset diabetes mellitus with diabetic neuropathy  #3 chronic anticoagulation on Coumadin #4 atrial fibrillation #5 status post pacemaker #6 breast cancer on Arimidex #7 coronary artery disease status post CABG   #8 cardiomyopathy  Plan; decrease Prilosec to 20 mg by mouth every morning   add align 1 by mouth daily Start trial of FD gard -2 by mouth twice a day, samples given and would like her to continue if she finds this helpful Follow-up with Dr. Sherrlyn Hock gamma or myself as needed   Alfredia Ferguson PA-C 05/06/2015   Cc: [name], MD

## 2015-05-06 NOTE — Patient Instructions (Addendum)
Prilosec/ Omeprazole 20 mg, take 1 tablet daily.   Take 2 capsules twice daily after meals of the FD Gard, ( purple box).  Align- take 1 capsule daily in the morning.     If you are age 80 or older, your body mass index should be between 23-30. Your There is no weight on file to calculate BMI. If this is out of the aforementioned range listed, please consider follow up with your Primary Care Provider.

## 2015-05-06 NOTE — Progress Notes (Signed)
Reviewed and agree with documentation and assessment and plan. K. Veena Raelynne Ludwick , MD   

## 2015-05-10 ENCOUNTER — Telehealth: Payer: Self-pay | Admitting: Cardiology

## 2015-05-10 NOTE — Telephone Encounter (Signed)
Spoke w/ pt and made her aware that her device has reached ERI. Informed pt that a scheduler will be in contact w/ her to schedule an appt w/ MD / NP / PA. Pt verbalized understanding and said she would tell her children.

## 2015-05-12 ENCOUNTER — Telehealth: Payer: Self-pay | Admitting: Cardiovascular Disease

## 2015-05-12 DIAGNOSIS — I25119 Atherosclerotic heart disease of native coronary artery with unspecified angina pectoris: Secondary | ICD-10-CM

## 2015-05-12 DIAGNOSIS — I442 Atrioventricular block, complete: Secondary | ICD-10-CM

## 2015-05-12 MED ORDER — RANOLAZINE ER 500 MG PO TB12: 500.0000 mg | ORAL_TABLET | Freq: Two times a day (BID) | ORAL | Status: DC

## 2015-05-12 NOTE — Telephone Encounter (Signed)
Pt's dtr [name] calling for samples of Renexa, pt having chest pain 1-2 days denies any other symptoms-wants results of transmission from Sun or Mon-and wants to know if pt should see Dr C- pls advise

## 2015-05-12 NOTE — Telephone Encounter (Signed)
Returned call to [name] (ok per DPR), no answer. Called pt back and spoke with her until her son, [name], came in and she gave me permission to talk with him. Pt stated she's been having CP on and off for 2-3 days since she ran out of her [name]. It comes on 2-3 times per day in her right chest.  Pt gave me permission to speak with her son who stated they ususally get sample of [name]. There are no samples at this time. Told son a new Rx will be sent to pharmacy.   Asked son does his mother seem to be her normal and he said yes. It was difficult for the pt to hear me at times to answer my questions. Son says she is not SOB, sweating, swelling, or having palpitations when he asks his mother the pt.  He says this is the same CP, his mother has had for years and she has not taken her [name] in a few days. BP this morning 121/78, HR 65.  Gave son information from previous telephone encounter where pt was called and told that her device has reached ERI and an appt needed to be scheduled. Son was unaware so appt was scheduled for 06/08/15.  Will route to Dr Sallyanne Kuster for evaluation if pt needs to be seen sooner.

## 2015-05-13 NOTE — Telephone Encounter (Signed)
We have a 90 day window until her device needs to be changed out. It will continue to operate reliably until then. Please schedule office ppt. Are there any [name] samples at [facility] [address].?

## 2015-05-14 NOTE — Telephone Encounter (Signed)
Routing to triage pool

## 2015-05-14 NOTE — Telephone Encounter (Signed)
Spoke to [place], daughter, gave recommendations. Pt has appt for may 23rd, PM change out to be discussed then.  She is aware we are out of [name] samples here, I will send request to [facility] to see if they can provide samples.

## 2015-05-14 NOTE — Telephone Encounter (Signed)
Returned daughter's call to let her know we have some samples of [name] 500mg  twice daily at our [facility] for her to pick up from the front desk. She said thank you.

## 2015-05-18 ENCOUNTER — Other Ambulatory Visit: Payer: Self-pay | Admitting: Cardiovascular Disease

## 2015-05-19 ENCOUNTER — Encounter: Payer: Self-pay | Admitting: Gastroenterology

## 2015-05-19 NOTE — Telephone Encounter (Signed)
Rx Refill

## 2015-05-20 ENCOUNTER — Ambulatory Visit (INDEPENDENT_AMBULATORY_CARE_PROVIDER_SITE_OTHER): Payer: PPO | Admitting: Pharmacist Clinician (PhC)/ Clinical Pharmacy Specialist

## 2015-05-20 DIAGNOSIS — Z7901 Long term (current) use of anticoagulants: Secondary | ICD-10-CM

## 2015-05-20 DIAGNOSIS — I824Z9 Acute embolism and thrombosis of unspecified deep veins of unspecified distal lower extremity: Secondary | ICD-10-CM

## 2015-05-20 LAB — POCT INR: INR: 3

## 2015-06-08 ENCOUNTER — Telehealth: Payer: Self-pay | Admitting: Gastroenterology

## 2015-06-08 ENCOUNTER — Encounter: Payer: Self-pay | Admitting: Cardiovascular Disease

## 2015-06-08 ENCOUNTER — Ambulatory Visit (INDEPENDENT_AMBULATORY_CARE_PROVIDER_SITE_OTHER): Payer: PPO | Admitting: Cardiovascular Disease

## 2015-06-08 ENCOUNTER — Ambulatory Visit (INDEPENDENT_AMBULATORY_CARE_PROVIDER_SITE_OTHER): Payer: PPO | Admitting: Pharmacist

## 2015-06-08 VITALS — BP 121/76 | HR 64 | Ht 64.0 in

## 2015-06-08 DIAGNOSIS — R609 Edema, unspecified: Secondary | ICD-10-CM

## 2015-06-08 DIAGNOSIS — R5383 Other fatigue: Secondary | ICD-10-CM

## 2015-06-08 DIAGNOSIS — I482 Chronic atrial fibrillation, unspecified: Secondary | ICD-10-CM

## 2015-06-08 DIAGNOSIS — I25111 Atherosclerotic heart disease of native coronary artery with angina pectoris with documented spasm: Secondary | ICD-10-CM

## 2015-06-08 DIAGNOSIS — I824Z9 Acute embolism and thrombosis of unspecified deep veins of unspecified distal lower extremity: Secondary | ICD-10-CM

## 2015-06-08 DIAGNOSIS — Z45010 Encounter for checking and testing of cardiac pacemaker pulse generator [battery]: Secondary | ICD-10-CM

## 2015-06-08 DIAGNOSIS — I5042 Chronic combined systolic (congestive) and diastolic (congestive) heart failure: Secondary | ICD-10-CM

## 2015-06-08 DIAGNOSIS — I442 Atrioventricular block, complete: Secondary | ICD-10-CM | POA: Diagnosis not present

## 2015-06-08 DIAGNOSIS — R0602 Shortness of breath: Secondary | ICD-10-CM

## 2015-06-08 DIAGNOSIS — Z7901 Long term (current) use of anticoagulants: Secondary | ICD-10-CM | POA: Diagnosis not present

## 2015-06-08 DIAGNOSIS — Z01812 Encounter for preprocedural laboratory examination: Secondary | ICD-10-CM

## 2015-06-08 DIAGNOSIS — D689 Coagulation defect, unspecified: Secondary | ICD-10-CM

## 2015-06-08 DIAGNOSIS — I82409 Acute embolism and thrombosis of unspecified deep veins of unspecified lower extremity: Secondary | ICD-10-CM

## 2015-06-08 LAB — CBC
HCT: 34.5 % — ABNORMAL LOW (ref 35.0–45.0)
Hemoglobin: 10.9 g/dL — ABNORMAL LOW (ref 11.7–15.5)
MCH: 29.1 pg (ref 27.0–33.0)
MCHC: 31.6 g/dL — ABNORMAL LOW (ref 32.0–36.0)
MCV: 92 fL (ref 80.0–100.0)
MPV: 11.2 fL (ref 7.5–12.5)
Platelets: 142 10*3/uL (ref 140–400)
RBC: 3.75 MIL/uL — AB (ref 3.80–5.10)
RDW: 13.7 % (ref 11.0–15.0)
WBC: 4.9 10*3/uL (ref 3.8–10.8)

## 2015-06-08 LAB — CUP PACEART INCLINIC DEVICE CHECK
Date Time Interrogation Session: 20170523104114
Implantable Lead Implant Date: 20080613
Implantable Lead Implant Date: 20080613
Implantable Lead Location: 753860
MDC IDC LEAD LOCATION: 753859

## 2015-06-08 LAB — BASIC METABOLIC PANEL
BUN: 23 mg/dL (ref 7–25)
CALCIUM: 8.6 mg/dL (ref 8.6–10.4)
CO2: 31 mmol/L (ref 20–31)
CREATININE: 1.49 mg/dL — AB (ref 0.60–0.88)
Chloride: 102 mmol/L (ref 98–110)
GLUCOSE: 116 mg/dL — AB (ref 65–99)
Potassium: 4.6 mmol/L (ref 3.5–5.3)
Sodium: 139 mmol/L (ref 135–146)

## 2015-06-08 LAB — APTT: aPTT: 55 seconds — ABNORMAL HIGH (ref 24–37)

## 2015-06-08 LAB — POCT INR: INR: 4.2

## 2015-06-08 LAB — PROTIME-INR
INR: 3.43 — ABNORMAL HIGH (ref <1.50)
Prothrombin Time: 35.1 seconds — ABNORMAL HIGH (ref 11.6–15.2)

## 2015-06-08 NOTE — Patient Instructions (Signed)
Dr Sallyanne Kuster recommends that you have a pacemaker generator (battery) change out next Tuesday, May 30th, 2017.  You will have to have blood work done prior to this procedure. You may go to any lab you choose.  Pacemaker Battery Change A pacemaker battery usually lasts 4 to 12 years. Once or twice per year, you will be asked to visit your health care provider to have a full evaluation of your pacemaker. When a battery needs to be replaced, the entire pacemaker is replaced so that you can benefit from new circuitry and any new features that have been added to pacemakers. Most often, this procedure is very simple because the leads are already in place.  There are many things that affect how long a pacemaker battery will last, including:   The age of the pacemaker.   The number of leads (1, 2, or 3).   The pacemaker work load. If the pacemaker is helping the heart more often, the battery will not last as long as it would if the pacemaker did not need to help the heart.   Power (voltage) settings. LET [facility] CARE PROVIDER KNOW ABOUT:   Any allergies you have.   All medicines you are taking, including vitamins, herbs, eye drops, creams, and over-the-counter medicines.   Previous problems you or members of your family have had with the use of anesthetics.   Any blood disorders you have.   Previous surgeries you have had, especially since your last pacemaker placement.   Medical conditions you have.   Possibility of pregnancy, if this applies.  Symptoms of chest pain, trouble breathing, palpitations, light-headedness, or feelings of an abnormal or irregular heartbeat. RISKS AND COMPLICATIONS  Generally, this is a safe procedure. However, as with any procedure, problems can occur and include:   Bleeding.   Bruising of the skin around where the incision was made.   Pain at the incision site.   Pulling apart of the skin at the incision site.   Infection.    Allergic reaction to anesthetics or other medicines used during the procedure.  People with diabetes may have a temporary increase in their blood sugar after any surgical procedure.  BEFORE THE PROCEDURE   Wash all of the skin around the area of the chest where the pacemaker is located.   Ask your health care provider for help with any medicine adjustments before the pacemaker is replaced.   Do not eat or drink anything after midnight on the night before the procedure or as directed by your health care provider.  Ask your health care provider if you can take a sip of water with any approved medicines the morning of the procedure. PROCEDURE   After giving medicine to numb the skin (local anesthetic), your health care provider will make a cut to reopen the pocket holding the pacemaker.   The old pacemaker will be disconnected from its leads.   The leads will be tested.   If needed, the leads will be replaced. If the leads are functioning properly, the new pacemaker may be connected to the existing leads.  A heart monitor and the pacemaker programmer will be used to make sure that the new pacemaker is working properly.  The incision site will then be closed. A dressing will be placed over the pacemaker site. The dressing will be removed 24-48 hours afterward. AFTER THE PROCEDURE   You will be taken to a recovery area after the new pacemaker implant is completed. Your vital signs  such as blood pressure, heart rate, breathing, and oxygen levels will be monitored.  Your health care provider will tell you when you will need to next test your pacemaker or when to return to the office for follow-up for removal of stitches.   This information is not intended to replace advice given to you by your health care provider. Make sure you discuss any questions you have with your health care provider.   Document Released: 04/12/2006 Document Revised: 01/23/2014 Document Reviewed:  07/17/2012 Elsevier Interactive Patient Education [employer].

## 2015-06-08 NOTE — Progress Notes (Signed)
Patient ID: [name], female   DOB: 10/26/1920, 82 y.o.   MRN: [id number]    Cardiology Office Note    Date:  06/08/2015   ID:  [name], DOB 08/04/1920, MRN [id number]  PCP:  [name] L Dean, MD  Cardiologist:   Haiven Nardone, MD   Chief Complaint  Patient presents with  . Follow-up  . Atrial Fibrillation    History of Present Illness:  [name] is a 82 y.o. female with High-grade second-degree AV block/complete heart block pacemaker dependent, here to discuss generator change after her device reached elective replacement indicator on March 12. Her dual-chamber device is programmed VVIR due to permanent atrial fibrillation. She is on chronic warfarin anticoagulation. Has a long-standing history of coronary artery disease with remote bypass surgery, with chronic angina pectoris controlled on medical therapy. She has chronic combined systolic and diastolic heart failure, NYHA functional class II-III. She has diabetes mellitus requiring oral antidiabetics and complicated by kidney disease and blindness. Not long ago she was diagnosed with ductal carcinoma in situ of the left breast. She has worsening hearing and mild to moderate dementia.  She has not noticed any change in her breathing or any other complaints since her device reached ERI. She has mild ankle swelling, close to her baseline. She has previously had GI bleeding, but has a very high embolic risk (CHADSVasc 7). No recent bleeding has occurred. INR today was excessively elevated at 4.2.  Limited device check could be performed today since the device has reached ERI. Lead parameters appear to be all normal.  Past Medical History  Diagnosis Date  . Hypertension   . Legally blind   . CKD (chronic kidney disease), stage III     a. iii - iv.  . Anemia   . Hypothyroid   . Gastric ulcer   . Hiatal hernia   . Dyspepsia   . UTI (lower urinary tract infection)   . H/O: GI bleed 12//13  . High cholesterol    . Chronic combined systolic and diastolic CHF (congestive heart failure) (HCC)     a. 01/2014 Echo: EF @ least mod-sev reduced with HK of lat/apical, basal inf walls. basalpost AK, Gr 1DD.  . DVT of upper extremity (deep vein thrombosis) (HCC) 06/13/2012    BUE  . Seasonal allergies   . Allergy to perfume   . Type II diabetes mellitus (HCC)   . History of blood transfusion 2013  . GERD (gastroesophageal reflux disease)   . Daily headache   . Arthritis   . PAF (paroxysmal atrial fibrillation) (HCC)     a. CHA2DS2VASc = 7-->coumadin.  . 2Nd degree atrioventricular block     a. s/p MDT ADDR01 DC PPM (ser #: [id number]).  . Coronary artery disease     a. 1994 s/p cabg;  b. 09/2013 MV: large, sev intensity, partially reversible inf, apical defect, prior inf/ap infarct w/ mild peri-infarct ischemia->Med Rx.  . Presence of permanent cardiac pacemaker     Past Surgical History  Procedure Laterality Date  . Esophagogastroduodenoscopy  12/22/2011    Procedure: ESOPHAGOGASTRODUODENOSCOPY (EGD);  Surgeon: Carl E Gessner, MD;  Location: [facility];  Service: Endoscopy;  Laterality: N/A;  . Cardioversion  06/06/06    successful  . Insert / replace / remove pacemaker  06/29/2006    Medtronic adapta  . Cardiac catheterization  10/25/92  . Cardiac catheterization  11/18/03    w/grafts 100%CX LAD 80 & 100%  . Cardiac catheterization    01/24/05    diffuse disease of native vessels  . Cardiac catheterization  06/06/06    severe native CAD  . Coronary artery bypass graft  10/27/92    LIMA to LAD,SVG to LAD second diagonal,obtuse maraginal of the CX and posterior descendingbranch of the RCA  . Cataract extraction w/ intraocular lens  implant, bilateral Bilateral   . Refractive surgery Bilateral     Current Medications: Outpatient Prescriptions Prior to Visit  Medication Sig Dispense Refill  . aspirin 81 MG chewable tablet Chew 81 mg by mouth daily.    . atropine 1 % ophthalmic solution Place 1 drop  into the right eye 4 (four) times daily.   0  . Blood Glucose Monitoring Suppl (ONE TOUCH ULTRA 2) w/Device KIT Use to check blood sugar 2 times per day dx code E11.65 1 each 0  . donepezil (ARICEPT) 5 MG tablet Take 5 mg by mouth at bedtime.   0  . folic acid (FOLVITE) 1 MG tablet Take 1 tablet (1 mg total) by mouth daily. 90 tablet 3  . folic acid (FOLVITE) 1 MG tablet take 1 tablet by mouth once daily 30 tablet 9  . furosemide (LASIX) 40 MG tablet Take 1 tablet by mouth  daily as needed for edema 90 tablet 3  . gabapentin (NEURONTIN) 100 MG capsule Take 100 mg by mouth 2 (two) times daily.     . glipiZIDE (GLUCOTROL) 5 MG tablet TAKE ONE-HALF TABLET BY  MOUTH DAILY ONLY IF BLOOD  SUGAR IS OVER 200 45 tablet 1  . glucose blood (ONE TOUCH ULTRA TEST) test strip Use as instructed to check blood sugar 2 times per day dx code E11.65 100 each 2  . loratadine (CLARITIN) 10 MG tablet Take 10 mg by mouth daily.    . meclizine (ANTIVERT) 25 MG tablet Take 25 mg by mouth 3 (three) times daily as needed for dizziness.    . nitroGLYCERIN (NITRODUR - DOSED IN MG/24 HR) 0.2 mg/hr patch Place 1 patch (0.2 mg total) onto the skin daily. 30 patch 6  . OVER THE COUNTER MEDICATION intrex shakes, one daily    . Polyvinyl Alcohol-Povidone (REFRESH OP) Place 1 drop into both eyes daily as needed (dry eyes).     . potassium chloride (K-DUR,KLOR-CON) 10 MEQ tablet Take 1 tablet by mouth two  times daily 180 tablet 3  . ranolazine ([name]) 500 MG 12 hr tablet Take 1 tablet (500 mg total) by mouth 2 (two) times daily. 180 tablet 3  . SYNTHROID 50 MCG tablet take 1 tablet by mouth once daily BEFORE BREAKFAST 30 tablet 3  . traMADol (ULTRAM) 50 MG tablet Take 1 tablet (50 mg total) by mouth every 6 (six) hours as needed for moderate pain. 30 tablet 0  . Vitamin D, Ergocalciferol, (DRISDOL) 50000 UNITS CAPS capsule Take 1,000 Units by mouth daily.     . warfarin (COUMADIN) 3 MG tablet Take 1 to 1.5 tablets by mouth daily or  as directed by coumadin clinic (Patient taking differently: Take 3 mg by mouth See admin instructions. Take 1 and 1/2 tablets on Monday, Wednesday and Friday then take 1 tablet all the other days) 40 tablet 3  . zolpidem (AMBIEN) 5 MG tablet Take 0.5 tablets (2.5 mg total) by mouth at bedtime. sleep 30 tablet 0  . linagliptin (TRADJENTA) 5 MG TABS tablet Take 1 tablet (5 mg total) by mouth daily. (Patient not taking: Reported on 06/08/2015) 30 tablet 1  . omeprazole (PRILOSEC)   20 MG capsule Take 1 capsule (20 mg total) by mouth 2 (two) times daily before a meal. (Patient not taking: Reported on 06/08/2015) 60 capsule 11  . ONETOUCH DELICA LANCETS 62I MISC Use to check blood sugar 2 times per day dx code E11.65 (Patient not taking: Reported on 06/08/2015) 100 each 2   No facility-administered medications prior to visit.     Allergies:   Darvon; Digoxin and related; Penicillins; Percocet; Percodan; and Vicodin   Social History   Social History  . Marital Status: Married    Spouse Name: N/A  . Number of Children: 10  . Years of Education: N/A   Occupational History  .     Social History Main Topics  . Smoking status: Never Smoker   . Smokeless tobacco: Never Used  . Alcohol Use: No  . Drug Use: No  . Sexual Activity: No   Other Topics Concern  . None   Social History Narrative     Family History:  The patient's family history includes Breast cancer in her daughter; Colon polyps in her daughter; Diabetes in her brother, daughter, father, and son; Heart attack in her brother; Heart disease in her father; Stroke in her sister. There is no history of Colon cancer.   ROS:   Please see the history of present illness.    ROS All other systems reviewed and are negative.   PHYSICAL EXAM:   VS:  BP 121/76 mmHg  Pulse 64  Ht 5' 4" (1.626 m)  SpO2 95% unable to stand to be weighed.  GEN: Obese, well developed, in no acute distress HEENT: normal Neck: no JVD, carotid bruits, or  masses Cardiac: Paradoxically split S2, RRR; no murmurs, rubs, or gallops, symmetrical 1+ ankle edema . Healthy-appearing left subclavian pacemaker site Respiratory:  clear to auscultation bilaterally, normal work of breathing GI: soft, nontender, nondistended, + BS MS: no deformity or atrophy Skin: warm and dry, no rash Neuro:  Alert and Oriented x 3, Strength and sensation are intact. Legally blind. Very hard of hearing. Psych: euthymic mood, full affect  Wt Readings from Last 3 Encounters:  03/30/15 84.7 kg (186 lb 11.7 oz)  01/20/15 79.379 kg (175 lb)  12/24/14 81.239 kg (179 lb 1.6 oz)      Studies/Labs Reviewed:   EKG:  EKG is not ordered today.    Recent Labs: 07/21/2014: B Natriuretic Peptide 261.6* 08/19/2014: Magnesium 2.0; TSH 3.116 01/20/2015: ALT 7 03/31/2015: BUN 22*; Creatinine, Ser 1.42*; Hemoglobin 11.6*; Platelets 149*; Potassium 4.6; Sodium 139   Lipid Panel    Component Value Date/Time   CHOL 170 10/24/2013 1006   TRIG 201.0* 10/24/2013 1006   HDL 30.50* 10/24/2013 1006   CHOLHDL 6 10/24/2013 1006   VLDL 40.2* 10/24/2013 1006   LDLCALC 61 06/24/2013 1127   LDLDIRECT 92.3 10/24/2013 1006      ASSESSMENT:    1. Pacemaker battery depletion   2. Complete heart block ([place])   3. Chronic atrial fibrillation ([place])   4. Chronic combined systolic and diastolic CHF (congestive heart failure) ([place])   5. Coronary artery disease involving native coronary artery of native heart with angina pectoris with documented spasm ([place])   6. Pre-procedural laboratory examination   7. Blood clotting disorder ([place])   8. Other fatigue   9. Swelling      PLAN:  In order of problems listed above:   1. PM_0 : She is pacemaker dependent. We'll schedule for device change out before June 12, preferably next  week. Plan using an antibiotic impregnated Tyrx pouch due to the possible critical complications with device infection in this patient. This procedure has been fully reviewed  with the patient and written informed consent has been obtained.   2. CHB - she has high-grade second-degree AV block and intermittent complete heart block but should be considered pacemaker dependent. However, I think the risks of bleeding with temporary transvenous pacemaker wire may be excessive in this obese patient. We'll try to do a quick generator change without temporary pacing   3. AFib: Plan to do the procedure on warfarin anticoagulation, preferably with INR 2.0 - 2.5. She has had a previous stroke, presumably embolic. 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  4. CHF: Mild ankle swelling, but otherwise without any clear evidence of heart failure/hypervolemia. No adjustment made to diuretics today. Check BNP with upcoming pre-procedure labs.  5. CAD: Angina has not bothered her recently. Continue same antianginal regimen     Medication Adjustments/Labs and Tests Ordered: Current medicines are reviewed at length with the patient today.  Concerns regarding medicines are outlined above.  Medication changes, Labs and Tests ordered today are listed in the Patient Instructions below. Patient Instructions  Dr Kadon Andrus recommends that you have a pacemaker generator (battery) change out next Tuesday, May 30th, 2017.  You will have to have blood work done prior to this procedure. You may go to any lab you choose.  Pacemaker Battery Change A pacemaker battery usually lasts 4 to 12 years. Once or twice per year, you will be asked to visit your health care provider to have a full evaluation of your pacemaker. When a battery needs to be replaced, the entire pacemaker is replaced so that you can benefit from new circuitry and any new features that have been added to pacemakers. Most often, this procedure is very simple because the leads are already in place.  There are many things that affect how long a pacemaker battery  will last, including:   The age of the pacemaker.   The number of leads (1, 2, or 3).   The pacemaker work load. If the pacemaker is helping the heart more often, the battery will not last as long as it would if the pacemaker did not need to help the heart.   Power (voltage) settings. LET YOUR HEALTH CARE PROVIDER KNOW ABOUT:   Any allergies you have.   All medicines you are taking, including vitamins, herbs, eye drops, creams, and over-the-counter medicines.   Previous problems you or members of your family have had with the use of anesthetics.   Any blood disorders you have.   Previous surgeries you have had, especially since your last pacemaker placement.   Medical conditions you have.   Possibility of pregnancy, if this applies.  Symptoms of chest pain, trouble breathing, palpitations, light-headedness, or feelings of an abnormal or irregular heartbeat. RISKS AND COMPLICATIONS  Generally, this is a safe procedure. However, as with any procedure, problems can occur and include:   Bleeding.   Bruising of the skin around where the incision was made.   Pain at the incision site.   Pulling apart of the skin at the incision site.   Infection.   Allergic reaction to anesthetics or other medicines used during the procedure.  People with diabetes may have a temporary increase in their blood sugar after any surgical procedure.  BEFORE THE PROCEDURE   Wash all of the   skin around the area of the chest where the pacemaker is located.   Ask your health care provider for help with any medicine adjustments before the pacemaker is replaced.   Do not eat or drink anything after midnight on the night before the procedure or as directed by your health care provider.  Ask your health care provider if you can take a sip of water with any approved medicines the morning of the procedure. PROCEDURE   After giving medicine to numb the skin (local anesthetic), your  health care provider will make a cut to reopen the pocket holding the pacemaker.   The old pacemaker will be disconnected from its leads.   The leads will be tested.   If needed, the leads will be replaced. If the leads are functioning properly, the new pacemaker may be connected to the existing leads.  A heart monitor and the pacemaker programmer will be used to make sure that the new pacemaker is working properly.  The incision site will then be closed. A dressing will be placed over the pacemaker site. The dressing will be removed 24-48 hours afterward. AFTER THE PROCEDURE   You will be taken to a recovery area after the new pacemaker implant is completed. Your vital signs such as blood pressure, heart rate, breathing, and oxygen levels will be monitored.  Your health care provider will tell you when you will need to next test your pacemaker or when to return to the office for follow-up for removal of stitches.   This information is not intended to replace advice given to you by your health care provider. Make sure you discuss any questions you have with your health care provider.   Document Released: 04/12/2006 Document Revised: 01/23/2014 Document Reviewed: 07/17/2012 Elsevier Interactive Patient Education 2016 Elsevier Inc.     Signed, Nazier Neyhart, MD  06/08/2015 5:24 PM    [facility] [address], [place], [place]  [address] Phone: [phone]; Fax: [phone]    

## 2015-06-09 LAB — TSH: TSH: 7.96 mIU/L — ABNORMAL HIGH

## 2015-06-09 LAB — BRAIN NATRIURETIC PEPTIDE: Brain Natriuretic Peptide: 452 pg/mL — ABNORMAL HIGH (ref <100)

## 2015-06-10 ENCOUNTER — Telehealth: Payer: Self-pay | Admitting: Pharmacist Clinician (PhC)/ Clinical Pharmacy Specialist

## 2015-06-10 NOTE — Telephone Encounter (Signed)
Monday is Memorial Day - office closed.  Patient was discharged from [facility] recently.  Reviewed with Dr. Loletha Grayer, will have patient stop at office on her way to [facility] Monday.  We can check INR before admission to hospital.  Dr. Loletha Grayer aware of situation

## 2015-06-10 NOTE — Telephone Encounter (Signed)
Daughter agreeable to stopping at [place] office on way to [facility] for generator change out.  Will text Dr. Loletha Grayer results.

## 2015-06-10 NOTE — Telephone Encounter (Signed)
FD Ggard samples at my desk for patient. Tried to call pt no answer

## 2015-06-10 NOTE — Telephone Encounter (Signed)
-----   Message from Sanda Klein, MD sent at 06/08/2015  5:27 PM EDT ----- I plan to do a pacemaker generator change out on her next Tuesday. Could you please recheck her INR next Monday? (whether in person or using home health). Thank you

## 2015-06-10 NOTE — Telephone Encounter (Signed)
Follow-up message    The daughter is retuning the nurses phone call to verify the pt's medications

## 2015-06-11 ENCOUNTER — Other Ambulatory Visit: Payer: Self-pay | Admitting: Cardiovascular Disease

## 2015-06-11 DIAGNOSIS — Z45010 Encounter for checking and testing of cardiac pacemaker pulse generator [battery]: Secondary | ICD-10-CM

## 2015-06-11 NOTE — Progress Notes (Signed)
Hospital orders placed for upcoming procedure 06/15/2015.

## 2015-06-15 ENCOUNTER — Telehealth: Payer: Self-pay | Admitting: Cardiovascular Disease

## 2015-06-15 ENCOUNTER — Ambulatory Visit (INDEPENDENT_AMBULATORY_CARE_PROVIDER_SITE_OTHER): Payer: PPO | Admitting: Pharmacist Clinician (PhC)/ Clinical Pharmacy Specialist

## 2015-06-15 DIAGNOSIS — Z7901 Long term (current) use of anticoagulants: Secondary | ICD-10-CM

## 2015-06-15 DIAGNOSIS — I824Z9 Acute embolism and thrombosis of unspecified deep veins of unspecified distal lower extremity: Secondary | ICD-10-CM

## 2015-06-15 DIAGNOSIS — I82409 Acute embolism and thrombosis of unspecified deep veins of unspecified lower extremity: Secondary | ICD-10-CM

## 2015-06-15 LAB — POCT INR: INR: 4

## 2015-06-15 NOTE — Telephone Encounter (Signed)
Follow-up     The daughter is bringing the pt now in a blue van for [place] to check the pt coumadin. They are parked beside the [employer] side.

## 2015-06-15 NOTE — Telephone Encounter (Signed)
New message     The daughter wants to verify the list of the pt medications, please.

## 2015-06-15 NOTE — Telephone Encounter (Signed)
Unable to reach pt dtr or leave a message, mailbox is full

## 2015-06-16 ENCOUNTER — Other Ambulatory Visit: Payer: Self-pay | Admitting: Cardiovascular Disease

## 2015-06-16 MED ORDER — FUROSEMIDE 40 MG PO TABS: 40.0000 mg | ORAL_TABLET | Freq: Every day | ORAL | Status: DC

## 2015-06-16 NOTE — Telephone Encounter (Signed)
Follow up      *STAT* If patient is at the pharmacy, call can be transferred to refill team.   1. Which medications need to be refilled? (please list name of each medication and dose if known)  Furosemide 40mg  2. Which pharmacy/location (including street and city if local pharmacy) is medication to be sent to? [facility] at [address]. Do they need a 30 day or 90 day supply? [place]

## 2015-06-16 NOTE — Telephone Encounter (Signed)
Rx request sent to pharmacy.  

## 2015-06-16 NOTE — Telephone Encounter (Signed)
Rx sent to pharmacy   

## 2015-06-17 ENCOUNTER — Telehealth: Payer: Self-pay | Admitting: Cardiovascular Disease

## 2015-06-17 NOTE — Telephone Encounter (Signed)
She needs you to fax a request for a Home Care Nurse to do a Coumadin check on Monday for the patient. Please call,she will give you more details.She asked to speak to you,that why I routed this encounter to you.Marland Kitchen

## 2015-06-17 NOTE — Telephone Encounter (Signed)
New message     The daughter wants to be called to get the order faxed to home health nurse for the home care for the pt to check PT/INR at home per daughter.

## 2015-06-18 ENCOUNTER — Telehealth: Payer: Self-pay | Admitting: Pharmacist

## 2015-06-18 NOTE — Telephone Encounter (Signed)
Calling again,agent still have not received fax.

## 2015-06-18 NOTE — Telephone Encounter (Signed)
Daughter called about having nursing to come to home to check INR. Explained that unfortunately we can not put order in for nurse to come to home to check INR, but if primary care orders nurse to come to home we would be glad to put an order in for her/him to check INR. She stated she understood and would reach out to primary care about a nurse. She stated she would continue to bring her mother for visit to the office until they were able to arrange nursing visits.

## 2015-06-18 NOTE — Telephone Encounter (Signed)
Spoke to daughter about order (see phone note from 06/17/13).

## 2015-06-21 ENCOUNTER — Ambulatory Visit (INDEPENDENT_AMBULATORY_CARE_PROVIDER_SITE_OTHER): Payer: PPO | Admitting: Pharmacist

## 2015-06-21 DIAGNOSIS — Z7901 Long term (current) use of anticoagulants: Secondary | ICD-10-CM

## 2015-06-21 DIAGNOSIS — I824Z9 Acute embolism and thrombosis of unspecified deep veins of unspecified distal lower extremity: Secondary | ICD-10-CM

## 2015-06-21 LAB — POCT INR: INR: 2.6

## 2015-06-22 ENCOUNTER — Encounter (HOSPITAL_COMMUNITY)
Admission: RE | Disposition: A | Discharge status: Home / Self Care | Payer: Self-pay | Source: Ambulatory Visit | Attending: Cardiovascular Disease

## 2015-06-22 ENCOUNTER — Ambulatory Visit (HOSPITAL_COMMUNITY)
Admission: RE | Admit: 2015-06-22 | Discharge: 2015-06-22 | Disposition: A | Discharge status: Home / Self Care | Payer: PPO | Source: Ambulatory Visit | Attending: Cardiovascular Disease | Admitting: Cardiovascular Disease

## 2015-06-22 DIAGNOSIS — H548 Legal blindness, as defined in USA: Secondary | ICD-10-CM | POA: Insufficient documentation

## 2015-06-22 DIAGNOSIS — Z45010 Encounter for checking and testing of cardiac pacemaker pulse generator [battery]: Secondary | ICD-10-CM | POA: Insufficient documentation

## 2015-06-22 DIAGNOSIS — E7800 Pure hypercholesterolemia, unspecified: Secondary | ICD-10-CM | POA: Diagnosis not present

## 2015-06-22 DIAGNOSIS — E1122 Type 2 diabetes mellitus with diabetic chronic kidney disease: Secondary | ICD-10-CM | POA: Insufficient documentation

## 2015-06-22 DIAGNOSIS — I5042 Chronic combined systolic (congestive) and diastolic (congestive) heart failure: Secondary | ICD-10-CM | POA: Diagnosis not present

## 2015-06-22 DIAGNOSIS — N183 Chronic kidney disease, stage 3 (moderate): Secondary | ICD-10-CM | POA: Insufficient documentation

## 2015-06-22 DIAGNOSIS — Z951 Presence of aortocoronary bypass graft: Secondary | ICD-10-CM | POA: Insufficient documentation

## 2015-06-22 DIAGNOSIS — M1990 Unspecified osteoarthritis, unspecified site: Secondary | ICD-10-CM | POA: Diagnosis not present

## 2015-06-22 DIAGNOSIS — D689 Coagulation defect, unspecified: Secondary | ICD-10-CM | POA: Insufficient documentation

## 2015-06-22 DIAGNOSIS — K219 Gastro-esophageal reflux disease without esophagitis: Secondary | ICD-10-CM | POA: Diagnosis not present

## 2015-06-22 DIAGNOSIS — E039 Hypothyroidism, unspecified: Secondary | ICD-10-CM | POA: Diagnosis not present

## 2015-06-22 DIAGNOSIS — I442 Atrioventricular block, complete: Secondary | ICD-10-CM | POA: Diagnosis not present

## 2015-06-22 DIAGNOSIS — D649 Anemia, unspecified: Secondary | ICD-10-CM | POA: Diagnosis not present

## 2015-06-22 DIAGNOSIS — Z8249 Family history of ischemic heart disease and other diseases of the circulatory system: Secondary | ICD-10-CM | POA: Diagnosis not present

## 2015-06-22 DIAGNOSIS — I130 Hypertensive heart and chronic kidney disease with heart failure and stage 1 through stage 4 chronic kidney disease, or unspecified chronic kidney disease: Secondary | ICD-10-CM | POA: Diagnosis not present

## 2015-06-22 DIAGNOSIS — I482 Chronic atrial fibrillation: Secondary | ICD-10-CM | POA: Insufficient documentation

## 2015-06-22 DIAGNOSIS — Z7982 Long term (current) use of aspirin: Secondary | ICD-10-CM | POA: Diagnosis not present

## 2015-06-22 DIAGNOSIS — Z7901 Long term (current) use of anticoagulants: Secondary | ICD-10-CM | POA: Diagnosis not present

## 2015-06-22 DIAGNOSIS — I2510 Atherosclerotic heart disease of native coronary artery without angina pectoris: Secondary | ICD-10-CM | POA: Insufficient documentation

## 2015-06-22 DIAGNOSIS — Z880 Allergy status to penicillin: Secondary | ICD-10-CM | POA: Insufficient documentation

## 2015-06-22 DIAGNOSIS — Z885 Allergy status to narcotic agent status: Secondary | ICD-10-CM | POA: Insufficient documentation

## 2015-06-22 DIAGNOSIS — Z86718 Personal history of other venous thrombosis and embolism: Secondary | ICD-10-CM | POA: Diagnosis not present

## 2015-06-22 DIAGNOSIS — Z8719 Personal history of other diseases of the digestive system: Secondary | ICD-10-CM | POA: Insufficient documentation

## 2015-06-22 HISTORY — PX: EP IMPLANTABLE DEVICE: SHX172B

## 2015-06-22 LAB — GLUCOSE, CAPILLARY
GLUCOSE-CAPILLARY: 83 mg/dL (ref 65–99)
Glucose-Capillary: 112 mg/dL — ABNORMAL HIGH (ref 65–99)

## 2015-06-22 LAB — SURGICAL PCR SCREEN
MRSA, PCR: NEGATIVE
Staphylococcus aureus: POSITIVE — AB

## 2015-06-22 LAB — PROTIME-INR
INR: 2.56 — ABNORMAL HIGH (ref 0.00–1.49)
PROTHROMBIN TIME: 27.2 s — AB (ref 11.6–15.2)

## 2015-06-22 SURGERY — PPM/BIV PPM GENERATOR CHANGEOUT

## 2015-06-22 MED ORDER — SODIUM CHLORIDE 0.9 % IR SOLN
Status: AC
  Filled 2015-06-22: qty 2

## 2015-06-22 MED ORDER — ONDANSETRON HCL 4 MG/2ML IJ SOLN: 4.0000 mg | Freq: Four times a day (QID) | INTRAMUSCULAR | Status: DC | PRN

## 2015-06-22 MED ORDER — SODIUM CHLORIDE 0.9 % IV SOLN: INTRAVENOUS | Status: DC

## 2015-06-22 MED ORDER — CEFAZOLIN SODIUM-DEXTROSE 2-4 GM/100ML-% IV SOLN
INTRAVENOUS | Status: AC
  Filled 2015-06-22: qty 100

## 2015-06-22 MED ORDER — VANCOMYCIN HCL IN DEXTROSE 1-5 GM/200ML-% IV SOLN
INTRAVENOUS | Status: AC
  Filled 2015-06-22: qty 200

## 2015-06-22 MED ORDER — MUPIROCIN 2 % EX OINT
TOPICAL_OINTMENT | CUTANEOUS | Status: AC
  Administered 2015-06-22: 1
  Filled 2015-06-22: qty 22

## 2015-06-22 MED ORDER — SODIUM CHLORIDE 0.9 % IV SOLN
INTRAVENOUS | Status: DC
  Administered 2015-06-22: 14:00:00 via INTRAVENOUS

## 2015-06-22 MED ORDER — LIDOCAINE HCL (PF) 1 % IJ SOLN
INTRAMUSCULAR | Status: DC | PRN
  Administered 2015-06-22: 42 mL

## 2015-06-22 MED ORDER — SODIUM CHLORIDE 0.9 % IR SOLN
80.0000 mg | Status: AC
  Administered 2015-06-22: 80 mg

## 2015-06-22 MED ORDER — LIDOCAINE HCL (PF) 1 % IJ SOLN
INTRAMUSCULAR | Status: AC
  Filled 2015-06-22: qty 30

## 2015-06-22 MED ORDER — ACETAMINOPHEN 325 MG PO TABS: 325.0000 mg | ORAL_TABLET | ORAL | Status: DC | PRN

## 2015-06-22 MED ORDER — CEFAZOLIN SODIUM-DEXTROSE 2-4 GM/100ML-% IV SOLN: 2.0000 g | INTRAVENOUS | Status: DC

## 2015-06-22 MED ORDER — MUPIROCIN 2 % EX OINT: 1.0000 "application " | TOPICAL_OINTMENT | Freq: Once | CUTANEOUS | Status: DC

## 2015-06-22 MED ORDER — VANCOMYCIN HCL IN DEXTROSE 1-5 GM/200ML-% IV SOLN
1000.0000 mg | INTRAVENOUS | Status: AC
  Administered 2015-06-22: 1000 mg via INTRAVENOUS

## 2015-06-22 MED ORDER — CHLORHEXIDINE GLUCONATE 4 % EX LIQD: 60.0000 mL | Freq: Once | CUTANEOUS | Status: DC

## 2015-06-22 SURGICAL SUPPLY — 7 items
CABLE SURGICAL S-101-97-12 (CABLE) ×1 IMPLANT
PACEMAKER ADAPTA DR ADDR01 (Pacemaker) IMPLANT
PAD DEFIB LIFELINK (PAD) ×1 IMPLANT
POUCH AIGIS-R ANTIBACT ICD (Mesh General) ×2 IMPLANT
POUCH AIGIS-R ANTIBACT ICD LRG (Mesh General) IMPLANT
PPM ADAPTA DR ADDR01 (Pacemaker) ×2 IMPLANT
TRAY PACEMAKER INSERTION (PACKS) ×1 IMPLANT

## 2015-06-22 NOTE — Discharge Instructions (Signed)
Supplemental Discharge Instructions for  °Pacemaker/Defibrillator Patients ° °Activity °No restrictions ° °WOUND CARE °  Keep the wound area clean and dry.  Remove the dressing the day after you return home (usually 48 hours after the procedure). °  DO NOT SUBMERGE UNDER WATER UNTIL FULLY HEALED (no tub baths, hot tubs, swimming pools, etc.).  °  You  may shower or take a sponge bath after the dressing is removed. DO NOT SOAK the area and do not allow the shower to directly spray on the site. °  If you have staples, these will be removed in the office in 7-14 days. °  If you have tape/steri-strips on your wound, these will fall off; do not pull them off prematurely.   °  No bandage is needed on the site.  DO  NOT apply any creams, oils, or ointments to the wound area. °  If you notice any drainage or discharge from the wound, any swelling, excessive redness or bruising at the site, or if you develop a fever > 101? F after you are discharged home, call the office at once. ° °Special Instructions °  You are still able to use cellular telephones.  Avoid carrying your cellular phone near your device. °  When traveling through airports, show security personnel your identification card to avoid being screened in the metal detectors.  °  Avoid arc welding equipment, MRI testing (magnetic resonance imaging), TENS units (transcutaneous nerve stimulators).  Call the office for questions about other devices. °  Avoid electrical appliances that are in poor condition or are not properly grounded. °  Microwave ovens are safe to be near or to operate. ° ° ° °

## 2015-06-22 NOTE — Op Note (Signed)
Procedure report  Procedure performed:  1. Dual chamber pacemaker generator changeout    Reason for procedure:  1. Device generator at elective replacement interval  2. Complete heart block  Procedure performed by:  Sanda Klein, MD  Complications:  None  Estimated blood loss:  <5 mL  Medications administered during procedure:   Vancomycin 1 g intravenously, lidocaine 1% 30 mL locally  Device details:   New Generator Medtronic Adapta model number ADDR01, serial number [id number] Right atrial lead (chronic) Medtronic [id number], model number [id number], serial [id number] (implanted 06/29/2006) Right ventricular lead (chronic)  Medtronic [id number], model number [id number], serial number [id number] (implanted 06/29/2006)  TYRX pouch implanted  Explanted generator Medtronic Adapta,  model number ADDR01, serial number  [id number] (implanted 06/29/2006)  Procedure details:  After the risks and benefits of the procedure were discussed the patient provided informed consent. She was brought to the cardiac catheter lab in the fasting state. The patient was prepped and draped in usual sterile fashion. Local anesthesia with 1% lidocaine was administered to to the left infraclavicular area. A 5-6cm horizontal incision was made parallel with and 2-3 cm caudal to the left clavicle, in the area of an old scar. An older scar was seen closer to the left clavicle. Using minimal electrocautery and mostly sharp and blunt dissection the prepectoral pocket was opened carefully to avoid injury to the loops of chronic leads. Extensive dissection was not necessary. The device was explanted. The pocket was carefully inspected for hemostasis and flushed with copious amounts of antibiotic solution.  The leads were disconnected from the old generator and testing of the lead parameters later showed excellent values. The new generator was connected to the chronic leads, with appropriate pacing noted.   The entire system was  inserted in the Tyrx pouch, then carefully inserted in the pocket with care been taking that the leads and device assumed a comfortable position without pressure on the incision. Great care was taken that the leads be located deep to the generator. The pocket was then closed in layers using 2 layers of 2-0 Vicryl and cutaneous staples after which a sterile dressing was applied.   At the end of the procedure the following lead parameters were encountered:   Right atrial lead sensed P waves 2.00-2.80 mV, impedance 397 ohms, threshold 0.5V at 0.4 ms pulse width.  Right ventricular lead sensed R waves  None detected , impedance 623 ohms, threshold 0.75V at 0.4 ms pulse width.  Sanda Klein, MD, [facility] [phone] office [phone] pager

## 2015-06-22 NOTE — Progress Notes (Signed)
Spoke with Dr Sallyanne Kuster and per Dr Sallyanne Kuster no cardiac monitoring needed and client may be discharged at 6pm

## 2015-06-22 NOTE — H&P (View-Only) (Signed)
Patient ID: [name], female   DOB: [birth year], 82 y.o.   MRN: [id number]    Cardiology Office Note    Date:  06/08/2015   ID:  [name], DOB 1920-11-04, MRN [id number]  PCP:  [name], MD  Cardiologist:   Sanda Klein, MD   Chief Complaint  Patient presents with  . Follow-up  . Atrial Fibrillation    History of Present Illness:  [name] is a 82 y.o. female with High-grade second-degree AV block/complete heart block pacemaker dependent, here to discuss generator change after her device reached elective replacement indicator on March 12. Her dual-chamber device is programmed VVIR due to permanent atrial fibrillation. She is on chronic warfarin anticoagulation. Has a long-standing history of coronary artery disease with remote bypass surgery, with chronic angina pectoris controlled on medical therapy. She has chronic combined systolic and diastolic heart failure, NYHA functional class II-III. She has diabetes mellitus requiring oral antidiabetics and complicated by kidney disease and blindness. Not long ago she was diagnosed with ductal carcinoma in situ of the left breast. She has worsening hearing and mild to moderate dementia.  She has not noticed any change in her breathing or any other complaints since her device reached ERI. She has mild ankle swelling, close to her baseline. She has previously had GI bleeding, but has a very high embolic risk (CHADSVasc 7). No recent bleeding has occurred. INR today was excessively elevated at 4.2.  Limited device check could be performed today since the device has reached ERI. Lead parameters appear to be all normal.  Past Medical History  Diagnosis Date  . Hypertension   . Legally blind   . CKD (chronic kidney disease), stage III     a. iii - iv.  . Anemia   . Hypothyroid   . Gastric ulcer   . Hiatal hernia   . Dyspepsia   . UTI (lower urinary tract infection)   . H/O: GI bleed 12//13  . High cholesterol    . Chronic combined systolic and diastolic CHF (congestive heart failure) ([place])     a. 01/2014 Echo: EF @ least mod-sev reduced with HK of lat/apical, basal inf walls. basalpost AK, Gr 1DD.  Marland Kitchen DVT of upper extremity (deep vein thrombosis) ([place]) 06/13/2012    BUE  . Seasonal allergies   . Allergy to perfume   . Type II diabetes mellitus ([place])   . History of blood transfusion 2013  . GERD (gastroesophageal reflux disease)   . Daily headache   . Arthritis   . PAF (paroxysmal atrial fibrillation) ([facility])     a. CHA2DS2VASc = 7-->coumadin.  . 2Nd degree atrioventricular block     a. s/p MDT ADDR01 DC PPM (ser #: [id number]).  . Coronary artery disease     a. 1994 s/p cabg;  b. 09/2013 MV: large, sev intensity, partially reversible inf, apical defect, prior inf/ap infarct w/ mild peri-infarct ischemia->Med Rx.  . Presence of permanent cardiac pacemaker     Past Surgical History  Procedure Laterality Date  . Esophagogastroduodenoscopy  12/22/2011    Procedure: ESOPHAGOGASTRODUODENOSCOPY (EGD);  Surgeon: Gatha Mayer, MD;  Location: [facility];  Service: Endoscopy;  Laterality: N/A;  . Cardioversion  06/06/06    successful  . Insert / replace / remove pacemaker  06/29/2006    Medtronic adapta  . Cardiac catheterization  10/25/92  . Cardiac catheterization  11/18/03    w/grafts 100%CX LAD 80 & 100%  . Cardiac catheterization  01/24/05    diffuse disease of native vessels  . Cardiac catheterization  06/06/06    severe native CAD  . Coronary artery bypass graft  10/27/92    LIMA to LAD,SVG to LAD second diagonal,obtuse maraginal of the CX and posterior descendingbranch of the RCA  . Cataract extraction w/ intraocular lens  implant, bilateral Bilateral   . Refractive surgery Bilateral     Current Medications: Outpatient Prescriptions Prior to Visit  Medication Sig Dispense Refill  . aspirin 81 MG chewable tablet Chew 81 mg by mouth daily.    Marland Kitchen atropine 1 % ophthalmic solution Place 1 drop  into the right eye 4 (four) times daily.   0  . Blood Glucose Monitoring Suppl (ONE TOUCH ULTRA 2) w/Device KIT Use to check blood sugar 2 times per day dx code E11.65 1 each 0  . donepezil (ARICEPT) 5 MG tablet Take 5 mg by mouth at bedtime.   0  . folic acid (FOLVITE) 1 MG tablet Take 1 tablet (1 mg total) by mouth daily. 90 tablet 3  . folic acid (FOLVITE) 1 MG tablet take 1 tablet by mouth once daily 30 tablet 9  . furosemide (LASIX) 40 MG tablet Take 1 tablet by mouth  daily as needed for edema 90 tablet 3  . gabapentin (NEURONTIN) 100 MG capsule Take 100 mg by mouth 2 (two) times daily.     Marland Kitchen glipiZIDE (GLUCOTROL) 5 MG tablet TAKE ONE-HALF TABLET BY  MOUTH DAILY ONLY IF BLOOD  SUGAR IS OVER 200 45 tablet 1  . glucose blood (ONE TOUCH ULTRA TEST) test strip Use as instructed to check blood sugar 2 times per day dx code E11.65 100 each 2  . loratadine (CLARITIN) 10 MG tablet Take 10 mg by mouth daily.    . meclizine (ANTIVERT) 25 MG tablet Take 25 mg by mouth 3 (three) times daily as needed for dizziness.    . nitroGLYCERIN (NITRODUR - DOSED IN MG/24 HR) 0.2 mg/hr patch Place 1 patch (0.2 mg total) onto the skin daily. 30 patch 6  . OVER THE COUNTER MEDICATION intrex shakes, one daily    . Polyvinyl Alcohol-Povidone (REFRESH OP) Place 1 drop into both eyes daily as needed (dry eyes).     . potassium chloride (K-DUR,KLOR-CON) 10 MEQ tablet Take 1 tablet by mouth two  times daily 180 tablet 3  . ranolazine ([name]) 500 MG 12 hr tablet Take 1 tablet (500 mg total) by mouth 2 (two) times daily. 180 tablet 3  . SYNTHROID 50 MCG tablet take 1 tablet by mouth once daily BEFORE BREAKFAST 30 tablet 3  . traMADol (ULTRAM) 50 MG tablet Take 1 tablet (50 mg total) by mouth every 6 (six) hours as needed for moderate pain. 30 tablet 0  . Vitamin D, Ergocalciferol, (DRISDOL) 50000 UNITS CAPS capsule Take 1,000 Units by mouth daily.     Marland Kitchen warfarin (COUMADIN) 3 MG tablet Take 1 to 1.5 tablets by mouth daily or  as directed by coumadin clinic (Patient taking differently: Take 3 mg by mouth See admin instructions. Take 1 and 1/2 tablets on Monday, Wednesday and Friday then take 1 tablet all the other days) 40 tablet 3  . zolpidem (AMBIEN) 5 MG tablet Take 0.5 tablets (2.5 mg total) by mouth at bedtime. sleep 30 tablet 0  . linagliptin (TRADJENTA) 5 MG TABS tablet Take 1 tablet (5 mg total) by mouth daily. (Patient not taking: Reported on 06/08/2015) 30 tablet 1  . omeprazole (PRILOSEC)  20 MG capsule Take 1 capsule (20 mg total) by mouth 2 (two) times daily before a meal. (Patient not taking: Reported on 06/08/2015) 60 capsule 11  . ONETOUCH DELICA LANCETS 62I MISC Use to check blood sugar 2 times per day dx code E11.65 (Patient not taking: Reported on 06/08/2015) 100 each 2   No facility-administered medications prior to visit.     Allergies:   [place]; Digoxin and related; Penicillins; Percocet; Percodan; and Vicodin   Social History   Social History  . Marital Status: Married    Spouse Name: N/A  . Number of Children: 10  . Years of Education: N/A   Occupational History  .     Social History Main Topics  . Smoking status: Never Smoker   . Smokeless tobacco: Never Used  . Alcohol Use: No  . Drug Use: No  . Sexual Activity: No   Other Topics Concern  . None   Social History Narrative     Family History:  The patient's family history includes Breast cancer in her daughter; Colon polyps in her daughter; Diabetes in her brother, daughter, father, and son; Heart attack in her brother; Heart disease in her father; Stroke in her sister. There is no history of Colon cancer.   ROS:   Please see the history of present illness.    ROS All other systems reviewed and are negative.   PHYSICAL EXAM:   VS:  BP 121/76 mmHg  Pulse 64  Ht 5' 4" (1.626 m)  SpO2 95% unable to stand to be weighed.  GEN: Obese, well developed, in no acute distress HEENT: normal Neck: no JVD, carotid bruits, or  masses Cardiac: Paradoxically split S2, RRR; no murmurs, rubs, or gallops, symmetrical 1+ ankle edema . Healthy-appearing left subclavian pacemaker site Respiratory:  clear to auscultation bilaterally, normal work of breathing GI: soft, nontender, nondistended, + BS MS: no deformity or atrophy Skin: warm and dry, no rash Neuro:  Alert and Oriented x 3, Strength and sensation are intact. Legally blind. Very hard of hearing. Psych: euthymic mood, full affect  Wt Readings from Last 3 Encounters:  03/30/15 84.7 kg (186 lb 11.7 oz)  01/20/15 79.379 kg (175 lb)  12/24/14 81.239 kg (179 lb 1.6 oz)      Studies/Labs Reviewed:   EKG:  EKG is not ordered today.    Recent Labs: 07/21/2014: B Natriuretic Peptide 261.6* 08/19/2014: Magnesium 2.0; TSH 3.116 01/20/2015: ALT 7 03/31/2015: BUN 22*; Creatinine, Ser 1.42*; Hemoglobin 11.6*; Platelets 149*; Potassium 4.6; Sodium 139   Lipid Panel    Component Value Date/Time   CHOL 170 10/24/2013 1006   TRIG 201.0* 10/24/2013 1006   HDL 30.50* 10/24/2013 1006   CHOLHDL 6 10/24/2013 1006   VLDL 40.2* 10/24/2013 1006   LDLCALC 61 06/24/2013 1127   LDLDIRECT 92.3 10/24/2013 1006      ASSESSMENT:    1. Pacemaker battery depletion   2. Complete heart block ([place])   3. Chronic atrial fibrillation ([place])   4. Chronic combined systolic and diastolic CHF (congestive heart failure) ([place])   5. Coronary artery disease involving native coronary artery of native heart with angina pectoris with documented spasm ([place])   6. Pre-procedural laboratory examination   7. Blood clotting disorder ([place])   8. Other fatigue   9. Swelling      PLAN:  In order of problems listed above:   1. PM_0 : She is pacemaker dependent. We'll schedule for device change out before June 12, preferably next  week. Plan using an antibiotic impregnated Tyrx pouch due to the possible critical complications with device infection in this patient. This procedure has been fully reviewed  with the patient and written informed consent has been obtained.   2. CHB - she has high-grade second-degree AV block and intermittent complete heart block but should be considered pacemaker dependent. However, I think the risks of bleeding with temporary transvenous pacemaker wire may be excessive in this obese patient. We'll try to do a quick generator change without temporary pacing   3. AFib: Plan to do the procedure on warfarin anticoagulation, preferably with INR 2.0 - 2.5. She has had a previous stroke, presumably embolic. 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  4. CHF: Mild ankle swelling, but otherwise without any clear evidence of heart failure/hypervolemia. No adjustment made to diuretics today. Check BNP with upcoming pre-procedure labs.  5. CAD: Angina has not bothered her recently. Continue same antianginal regimen     Medication Adjustments/Labs and Tests Ordered: Current medicines are reviewed at length with the patient today.  Concerns regarding medicines are outlined above.  Medication changes, Labs and Tests ordered today are listed in the Patient Instructions below. Patient Instructions  Dr Sallyanne Kuster recommends that you have a pacemaker generator (battery) change out next Tuesday, May 30th, 2017.  You will have to have blood work done prior to this procedure. You may go to any lab you choose.  Pacemaker Battery Change A pacemaker battery usually lasts 4 to 12 years. Once or twice per year, you will be asked to visit your health care provider to have a full evaluation of your pacemaker. When a battery needs to be replaced, the entire pacemaker is replaced so that you can benefit from new circuitry and any new features that have been added to pacemakers. Most often, this procedure is very simple because the leads are already in place.  There are many things that affect how long a pacemaker battery  will last, including:   The age of the pacemaker.   The number of leads (1, 2, or 3).   The pacemaker work load. If the pacemaker is helping the heart more often, the battery will not last as long as it would if the pacemaker did not need to help the heart.   Power (voltage) settings. LET [facility] CARE PROVIDER KNOW ABOUT:   Any allergies you have.   All medicines you are taking, including vitamins, herbs, eye drops, creams, and over-the-counter medicines.   Previous problems you or members of your family have had with the use of anesthetics.   Any blood disorders you have.   Previous surgeries you have had, especially since your last pacemaker placement.   Medical conditions you have.   Possibility of pregnancy, if this applies.  Symptoms of chest pain, trouble breathing, palpitations, light-headedness, or feelings of an abnormal or irregular heartbeat. RISKS AND COMPLICATIONS  Generally, this is a safe procedure. However, as with any procedure, problems can occur and include:   Bleeding.   Bruising of the skin around where the incision was made.   Pain at the incision site.   Pulling apart of the skin at the incision site.   Infection.   Allergic reaction to anesthetics or other medicines used during the procedure.  People with diabetes may have a temporary increase in their blood sugar after any surgical procedure.  BEFORE THE PROCEDURE   Wash all of the  skin around the area of the chest where the pacemaker is located.   Ask your health care provider for help with any medicine adjustments before the pacemaker is replaced.   Do not eat or drink anything after midnight on the night before the procedure or as directed by your health care provider.  Ask your health care provider if you can take a sip of water with any approved medicines the morning of the procedure. PROCEDURE   After giving medicine to numb the skin (local anesthetic), your  health care provider will make a cut to reopen the pocket holding the pacemaker.   The old pacemaker will be disconnected from its leads.   The leads will be tested.   If needed, the leads will be replaced. If the leads are functioning properly, the new pacemaker may be connected to the existing leads.  A heart monitor and the pacemaker programmer will be used to make sure that the new pacemaker is working properly.  The incision site will then be closed. A dressing will be placed over the pacemaker site. The dressing will be removed 24-48 hours afterward. AFTER THE PROCEDURE   You will be taken to a recovery area after the new pacemaker implant is completed. Your vital signs such as blood pressure, heart rate, breathing, and oxygen levels will be monitored.  Your health care provider will tell you when you will need to next test your pacemaker or when to return to the office for follow-up for removal of stitches.   This information is not intended to replace advice given to you by your health care provider. Make sure you discuss any questions you have with your health care provider.   Document Released: 04/12/2006 Document Revised: 01/23/2014 Document Reviewed: 07/17/2012 Elsevier Interactive Patient Education 2016 [place], Sanda Klein, MD  06/08/2015 5:24 PM    [facility] [address], [place], [place]  [address] Phone: [phone]; Fax: [phone]

## 2015-06-22 NOTE — Interval H&P Note (Signed)
History and Physical Interval Note:  06/22/2015 2:42 PM  Loucinda R Mosco  has presented today for surgery, with the diagnosis of complete heart block and pacemaker at [facility].  The various methods of treatment have been discussed with the patient and family. After consideration of risks, benefits and other options for treatment, the patient has consented to  Procedure(s): Pacemaker Implant (N/A) as a surgical intervention .  The patient's history has been reviewed, patient examined, no change in status, stable for surgery.  I have reviewed the patient's chart and labs.  Questions were answered to the patient's satisfaction.     [name]

## 2015-06-23 ENCOUNTER — Encounter (HOSPITAL_COMMUNITY): Payer: Self-pay | Admitting: Cardiovascular Disease

## 2015-06-23 ENCOUNTER — Telehealth: Payer: Self-pay | Admitting: Cardiovascular Disease

## 2015-06-23 NOTE — Telephone Encounter (Signed)
New message     Pt had a pacemaker battery changeout yesterday.  When should she restart her coumadin?

## 2015-06-23 NOTE — Telephone Encounter (Signed)
Restart warfarin today, will have [place] recheck INR in 2 weeks.

## 2015-06-24 ENCOUNTER — Ambulatory Visit: Payer: Medicare Other | Admitting: Hematology and Oncology

## 2015-06-24 ENCOUNTER — Telehealth: Payer: Self-pay | Admitting: *Deleted

## 2015-06-24 ENCOUNTER — Telehealth: Payer: Self-pay | Admitting: Cardiovascular Disease

## 2015-06-24 ENCOUNTER — Inpatient Hospital Stay (HOSPITAL_COMMUNITY)
Admission: EM | Admit: 2015-06-24 | Discharge: 2015-07-01 | DRG: 280 | Disposition: A | Discharge status: Home Health | Payer: PPO | Attending: Internal Medicine | Admitting: Internal Medicine

## 2015-06-24 ENCOUNTER — Encounter (HOSPITAL_COMMUNITY): Payer: Self-pay | Admitting: Emergency Medicine

## 2015-06-24 DIAGNOSIS — B958 Unspecified staphylococcus as the cause of diseases classified elsewhere: Secondary | ICD-10-CM

## 2015-06-24 DIAGNOSIS — M79671 Pain in right foot: Secondary | ICD-10-CM

## 2015-06-24 DIAGNOSIS — R634 Abnormal weight loss: Secondary | ICD-10-CM

## 2015-06-24 DIAGNOSIS — Z888 Allergy status to other drugs, medicaments and biological substances status: Secondary | ICD-10-CM

## 2015-06-24 DIAGNOSIS — Z8249 Family history of ischemic heart disease and other diseases of the circulatory system: Secondary | ICD-10-CM

## 2015-06-24 DIAGNOSIS — E119 Type 2 diabetes mellitus without complications: Secondary | ICD-10-CM

## 2015-06-24 DIAGNOSIS — I214 Non-ST elevation (NSTEMI) myocardial infarction: Secondary | ICD-10-CM | POA: Diagnosis not present

## 2015-06-24 DIAGNOSIS — R7989 Other specified abnormal findings of blood chemistry: Secondary | ICD-10-CM | POA: Diagnosis not present

## 2015-06-24 DIAGNOSIS — Z7189 Other specified counseling: Secondary | ICD-10-CM

## 2015-06-24 DIAGNOSIS — K8500 Idiopathic acute pancreatitis without necrosis or infection: Secondary | ICD-10-CM

## 2015-06-24 DIAGNOSIS — E038 Other specified hypothyroidism: Secondary | ICD-10-CM

## 2015-06-24 DIAGNOSIS — N183 Chronic kidney disease, stage 3 unspecified: Secondary | ICD-10-CM

## 2015-06-24 DIAGNOSIS — I209 Angina pectoris, unspecified: Secondary | ICD-10-CM | POA: Diagnosis not present

## 2015-06-24 DIAGNOSIS — N289 Disorder of kidney and ureter, unspecified: Secondary | ICD-10-CM

## 2015-06-24 DIAGNOSIS — A499 Bacterial infection, unspecified: Secondary | ICD-10-CM

## 2015-06-24 DIAGNOSIS — F0390 Unspecified dementia without behavioral disturbance: Secondary | ICD-10-CM | POA: Diagnosis present

## 2015-06-24 DIAGNOSIS — R778 Other specified abnormalities of plasma proteins: Secondary | ICD-10-CM | POA: Diagnosis present

## 2015-06-24 DIAGNOSIS — R531 Weakness: Secondary | ICD-10-CM | POA: Diagnosis not present

## 2015-06-24 DIAGNOSIS — A411 Sepsis due to other specified staphylococcus: Secondary | ICD-10-CM

## 2015-06-24 DIAGNOSIS — L8990 Pressure ulcer of unspecified site, unspecified stage: Secondary | ICD-10-CM

## 2015-06-24 DIAGNOSIS — D509 Iron deficiency anemia, unspecified: Secondary | ICD-10-CM

## 2015-06-24 DIAGNOSIS — E785 Hyperlipidemia, unspecified: Secondary | ICD-10-CM | POA: Diagnosis present

## 2015-06-24 DIAGNOSIS — D649 Anemia, unspecified: Secondary | ICD-10-CM

## 2015-06-24 DIAGNOSIS — J9601 Acute respiratory failure with hypoxia: Secondary | ICD-10-CM

## 2015-06-24 DIAGNOSIS — I5043 Acute on chronic combined systolic (congestive) and diastolic (congestive) heart failure: Secondary | ICD-10-CM

## 2015-06-24 DIAGNOSIS — C50112 Malignant neoplasm of central portion of left female breast: Secondary | ICD-10-CM

## 2015-06-24 DIAGNOSIS — I5032 Chronic diastolic (congestive) heart failure: Secondary | ICD-10-CM

## 2015-06-24 DIAGNOSIS — T502X5A Adverse effect of carbonic-anhydrase inhibitors, benzothiadiazides and other diuretics, initial encounter: Secondary | ICD-10-CM | POA: Diagnosis not present

## 2015-06-24 DIAGNOSIS — R0600 Dyspnea, unspecified: Secondary | ICD-10-CM

## 2015-06-24 DIAGNOSIS — Z993 Dependence on wheelchair: Secondary | ICD-10-CM

## 2015-06-24 DIAGNOSIS — I5042 Chronic combined systolic (congestive) and diastolic (congestive) heart failure: Secondary | ICD-10-CM | POA: Diagnosis not present

## 2015-06-24 DIAGNOSIS — I482 Chronic atrial fibrillation, unspecified: Secondary | ICD-10-CM

## 2015-06-24 DIAGNOSIS — R0789 Other chest pain: Secondary | ICD-10-CM

## 2015-06-24 DIAGNOSIS — E039 Hypothyroidism, unspecified: Secondary | ICD-10-CM

## 2015-06-24 DIAGNOSIS — Z86718 Personal history of other venous thrombosis and embolism: Secondary | ICD-10-CM

## 2015-06-24 DIAGNOSIS — I824Z9 Acute embolism and thrombosis of unspecified deep veins of unspecified distal lower extremity: Secondary | ICD-10-CM

## 2015-06-24 DIAGNOSIS — D696 Thrombocytopenia, unspecified: Secondary | ICD-10-CM

## 2015-06-24 DIAGNOSIS — H548 Legal blindness, as defined in USA: Secondary | ICD-10-CM | POA: Diagnosis present

## 2015-06-24 DIAGNOSIS — R1033 Periumbilical pain: Secondary | ICD-10-CM

## 2015-06-24 DIAGNOSIS — R9439 Abnormal result of other cardiovascular function study: Secondary | ICD-10-CM

## 2015-06-24 DIAGNOSIS — E46 Unspecified protein-calorie malnutrition: Secondary | ICD-10-CM

## 2015-06-24 DIAGNOSIS — Z885 Allergy status to narcotic agent status: Secondary | ICD-10-CM

## 2015-06-24 DIAGNOSIS — Z7984 Long term (current) use of oral hypoglycemic drugs: Secondary | ICD-10-CM

## 2015-06-24 DIAGNOSIS — Z7901 Long term (current) use of anticoagulants: Secondary | ICD-10-CM

## 2015-06-24 DIAGNOSIS — R1013 Epigastric pain: Secondary | ICD-10-CM

## 2015-06-24 DIAGNOSIS — Z45010 Encounter for checking and testing of cardiac pacemaker pulse generator [battery]: Secondary | ICD-10-CM

## 2015-06-24 DIAGNOSIS — IMO0002 Reserved for concepts with insufficient information to code with codable children: Secondary | ICD-10-CM

## 2015-06-24 DIAGNOSIS — Z79899 Other long term (current) drug therapy: Secondary | ICD-10-CM

## 2015-06-24 DIAGNOSIS — I442 Atrioventricular block, complete: Secondary | ICD-10-CM | POA: Diagnosis not present

## 2015-06-24 DIAGNOSIS — R072 Precordial pain: Secondary | ICD-10-CM | POA: Diagnosis not present

## 2015-06-24 DIAGNOSIS — M109 Gout, unspecified: Secondary | ICD-10-CM | POA: Diagnosis present

## 2015-06-24 DIAGNOSIS — E875 Hyperkalemia: Secondary | ICD-10-CM

## 2015-06-24 DIAGNOSIS — I25111 Atherosclerotic heart disease of native coronary artery with angina pectoris with documented spasm: Secondary | ICD-10-CM

## 2015-06-24 DIAGNOSIS — Z951 Presence of aortocoronary bypass graft: Secondary | ICD-10-CM | POA: Diagnosis present

## 2015-06-24 DIAGNOSIS — I10 Essential (primary) hypertension: Secondary | ICD-10-CM

## 2015-06-24 DIAGNOSIS — R945 Abnormal results of liver function studies: Secondary | ICD-10-CM

## 2015-06-24 DIAGNOSIS — N189 Chronic kidney disease, unspecified: Secondary | ICD-10-CM

## 2015-06-24 DIAGNOSIS — E1142 Type 2 diabetes mellitus with diabetic polyneuropathy: Secondary | ICD-10-CM | POA: Diagnosis present

## 2015-06-24 DIAGNOSIS — R6521 Severe sepsis with septic shock: Secondary | ICD-10-CM

## 2015-06-24 DIAGNOSIS — E1165 Type 2 diabetes mellitus with hyperglycemia: Secondary | ICD-10-CM | POA: Diagnosis present

## 2015-06-24 DIAGNOSIS — R630 Anorexia: Secondary | ICD-10-CM

## 2015-06-24 DIAGNOSIS — Z833 Family history of diabetes mellitus: Secondary | ICD-10-CM

## 2015-06-24 DIAGNOSIS — A4151 Sepsis due to Escherichia coli [E. coli]: Secondary | ICD-10-CM

## 2015-06-24 DIAGNOSIS — E876 Hypokalemia: Secondary | ICD-10-CM | POA: Diagnosis present

## 2015-06-24 DIAGNOSIS — M79602 Pain in left arm: Secondary | ICD-10-CM

## 2015-06-24 DIAGNOSIS — R740 Nonspecific elevation of levels of transaminase and lactic acid dehydrogenase [LDH]: Secondary | ICD-10-CM

## 2015-06-24 DIAGNOSIS — K219 Gastro-esophageal reflux disease without esophagitis: Secondary | ICD-10-CM | POA: Diagnosis present

## 2015-06-24 DIAGNOSIS — R7401 Elevation of levels of liver transaminase levels: Secondary | ICD-10-CM

## 2015-06-24 DIAGNOSIS — I480 Paroxysmal atrial fibrillation: Secondary | ICD-10-CM

## 2015-06-24 DIAGNOSIS — G8929 Other chronic pain: Secondary | ICD-10-CM

## 2015-06-24 DIAGNOSIS — R1032 Left lower quadrant pain: Secondary | ICD-10-CM

## 2015-06-24 DIAGNOSIS — N390 Urinary tract infection, site not specified: Secondary | ICD-10-CM

## 2015-06-24 DIAGNOSIS — D638 Anemia in other chronic diseases classified elsewhere: Secondary | ICD-10-CM

## 2015-06-24 DIAGNOSIS — R112 Nausea with vomiting, unspecified: Secondary | ICD-10-CM

## 2015-06-24 DIAGNOSIS — E1122 Type 2 diabetes mellitus with diabetic chronic kidney disease: Secondary | ICD-10-CM | POA: Diagnosis present

## 2015-06-24 DIAGNOSIS — R55 Syncope and collapse: Secondary | ICD-10-CM

## 2015-06-24 DIAGNOSIS — Z880 Allergy status to penicillin: Secondary | ICD-10-CM

## 2015-06-24 DIAGNOSIS — Z1612 Extended spectrum beta lactamase (ESBL) resistance: Secondary | ICD-10-CM

## 2015-06-24 DIAGNOSIS — N179 Acute kidney failure, unspecified: Secondary | ICD-10-CM

## 2015-06-24 DIAGNOSIS — R0602 Shortness of breath: Secondary | ICD-10-CM

## 2015-06-24 DIAGNOSIS — Z950 Presence of cardiac pacemaker: Secondary | ICD-10-CM

## 2015-06-24 DIAGNOSIS — R609 Edema, unspecified: Secondary | ICD-10-CM

## 2015-06-24 DIAGNOSIS — K449 Diaphragmatic hernia without obstruction or gangrene: Secondary | ICD-10-CM

## 2015-06-24 DIAGNOSIS — E871 Hypo-osmolality and hyponatremia: Secondary | ICD-10-CM

## 2015-06-24 DIAGNOSIS — Z515 Encounter for palliative care: Secondary | ICD-10-CM

## 2015-06-24 DIAGNOSIS — I9589 Other hypotension: Secondary | ICD-10-CM

## 2015-06-24 DIAGNOSIS — I130 Hypertensive heart and chronic kidney disease with heart failure and stage 1 through stage 4 chronic kidney disease, or unspecified chronic kidney disease: Secondary | ICD-10-CM | POA: Diagnosis present

## 2015-06-24 DIAGNOSIS — E063 Autoimmune thyroiditis: Secondary | ICD-10-CM

## 2015-06-24 DIAGNOSIS — A419 Sepsis, unspecified organism: Secondary | ICD-10-CM

## 2015-06-24 DIAGNOSIS — I25119 Atherosclerotic heart disease of native coronary artery with unspecified angina pectoris: Secondary | ICD-10-CM | POA: Diagnosis present

## 2015-06-24 LAB — PROTIME-INR
INR: 1.99 — ABNORMAL HIGH (ref 0.00–1.49)
PROTHROMBIN TIME: 22.5 s — AB (ref 11.6–15.2)

## 2015-06-24 LAB — CBC WITH DIFFERENTIAL/PLATELET
BASOS PCT: 0 %
Basophils Absolute: 0 10*3/uL (ref 0.0–0.1)
EOS ABS: 0.1 10*3/uL (ref 0.0–0.7)
Eosinophils Relative: 1 %
HCT: 34.3 % — ABNORMAL LOW (ref 36.0–46.0)
HEMOGLOBIN: 10.7 g/dL — AB (ref 12.0–15.0)
LYMPHS ABS: 1.5 10*3/uL (ref 0.7–4.0)
LYMPHS PCT: 21 %
MCH: 28.6 pg (ref 26.0–34.0)
MCHC: 31.2 g/dL (ref 30.0–36.0)
MCV: 91.7 fL (ref 78.0–100.0)
Monocytes Absolute: 0.5 10*3/uL (ref 0.1–1.0)
Monocytes Relative: 7 %
NEUTROS PCT: 71 %
Neutro Abs: 4.9 10*3/uL (ref 1.7–7.7)
Platelets: 110 10*3/uL — ABNORMAL LOW (ref 150–400)
RBC: 3.74 MIL/uL — AB (ref 3.87–5.11)
RDW: 14.6 % (ref 11.5–15.5)
WBC: 6.9 10*3/uL (ref 4.0–10.5)

## 2015-06-24 LAB — BASIC METABOLIC PANEL
Anion gap: 6 (ref 5–15)
BUN: 18 mg/dL (ref 6–20)
CHLORIDE: 103 mmol/L (ref 101–111)
CO2: 26 mmol/L (ref 22–32)
Calcium: 8.7 mg/dL — ABNORMAL LOW (ref 8.9–10.3)
Creatinine, Ser: 1.24 mg/dL — ABNORMAL HIGH (ref 0.44–1.00)
GFR calc non Af Amer: 37 mL/min — ABNORMAL LOW (ref >60)
GFR, EST AFRICAN AMERICAN: 42 mL/min — AB (ref >60)
Glucose, Bld: 190 mg/dL — ABNORMAL HIGH (ref 65–99)
POTASSIUM: 5.6 mmol/L — AB (ref 3.5–5.1)
SODIUM: 135 mmol/L (ref 135–145)

## 2015-06-24 LAB — TROPONIN I: TROPONIN I: 0.12 ng/mL — AB (ref <0.031)

## 2015-06-24 LAB — BRAIN NATRIURETIC PEPTIDE: B NATRIURETIC PEPTIDE 5: 372.7 pg/mL — AB (ref 0.0–100.0)

## 2015-06-24 LAB — MAGNESIUM: MAGNESIUM: 2 mg/dL (ref 1.7–2.4)

## 2015-06-24 LAB — GLUCOSE, CAPILLARY: Glucose-Capillary: 141 mg/dL — ABNORMAL HIGH (ref 65–99)

## 2015-06-24 MED ORDER — SODIUM POLYSTYRENE SULFONATE 15 GM/60ML PO SUSP
30.0000 g | Freq: Once | ORAL | Status: AC
  Administered 2015-06-24: 30 g via ORAL
  Filled 2015-06-24: qty 120

## 2015-06-24 MED ORDER — DONEPEZIL HCL 5 MG PO TABS
5.0000 mg | ORAL_TABLET | Freq: Every day | ORAL | Status: DC
Start: 2015-06-24 — End: 2015-07-01
  Administered 2015-06-24 – 2015-06-30 (×7): 5 mg via ORAL
  Filled 2015-06-24 (×7): qty 1

## 2015-06-24 MED ORDER — LORATADINE 10 MG PO TABS
10.0000 mg | ORAL_TABLET | Freq: Every day | ORAL | Status: DC
  Administered 2015-06-25 – 2015-07-01 (×7): 10 mg via ORAL
  Filled 2015-06-24 (×8): qty 1

## 2015-06-24 MED ORDER — ALBUTEROL SULFATE (2.5 MG/3ML) 0.083% IN NEBU
3.0000 mL | INHALATION_SOLUTION | Freq: Four times a day (QID) | RESPIRATORY_TRACT | Status: DC | PRN
  Administered 2015-06-25 – 2015-06-27 (×2): 3 mL via RESPIRATORY_TRACT
  Filled 2015-06-24 (×3): qty 3

## 2015-06-24 MED ORDER — WARFARIN SODIUM 3 MG PO TABS
3.0000 mg | ORAL_TABLET | Freq: Once | ORAL | Status: AC
  Administered 2015-06-24: 3 mg via ORAL
  Filled 2015-06-24: qty 1

## 2015-06-24 MED ORDER — INSULIN ASPART 100 UNIT/ML ~~LOC~~ SOLN
0.0000 [IU] | Freq: Three times a day (TID) | SUBCUTANEOUS | Status: DC
  Administered 2015-06-25: 3 [IU] via SUBCUTANEOUS
  Administered 2015-06-26: 1 [IU] via SUBCUTANEOUS
  Administered 2015-06-26: 2 [IU] via SUBCUTANEOUS
  Administered 2015-06-27 (×3): 5 [IU] via SUBCUTANEOUS
  Administered 2015-06-28 (×2): 3 [IU] via SUBCUTANEOUS
  Administered 2015-06-28: 2 [IU] via SUBCUTANEOUS
  Administered 2015-06-29: 3 [IU] via SUBCUTANEOUS

## 2015-06-24 MED ORDER — TRAMADOL HCL 50 MG PO TABS
50.0000 mg | ORAL_TABLET | Freq: Two times a day (BID) | ORAL | Status: DC | PRN
  Administered 2015-06-24 – 2015-06-26 (×3): 50 mg via ORAL
  Filled 2015-06-24 (×3): qty 1

## 2015-06-24 MED ORDER — GI COCKTAIL ~~LOC~~
30.0000 mL | Freq: Four times a day (QID) | ORAL | Status: DC | PRN
  Administered 2015-06-27: 30 mL via ORAL
  Filled 2015-06-24: qty 30

## 2015-06-24 MED ORDER — VITAMIN D 1000 UNITS PO TABS
1000.0000 [IU] | ORAL_TABLET | Freq: Every day | ORAL | Status: DC
  Administered 2015-06-25 – 2015-07-01 (×7): 1000 [IU] via ORAL
  Filled 2015-06-24 (×8): qty 1

## 2015-06-24 MED ORDER — FOLIC ACID 1 MG PO TABS
1.0000 mg | ORAL_TABLET | Freq: Every day | ORAL | Status: DC
  Administered 2015-06-25 – 2015-07-01 (×7): 1 mg via ORAL
  Filled 2015-06-24 (×8): qty 1

## 2015-06-24 MED ORDER — WARFARIN - PHARMACIST DOSING INPATIENT: Freq: Every day | Status: DC

## 2015-06-24 MED ORDER — GABAPENTIN 100 MG PO CAPS
100.0000 mg | ORAL_CAPSULE | Freq: Two times a day (BID) | ORAL | Status: DC
  Administered 2015-06-24 – 2015-07-01 (×14): 100 mg via ORAL
  Filled 2015-06-24 (×14): qty 1

## 2015-06-24 MED ORDER — PANTOPRAZOLE SODIUM 40 MG PO TBEC
40.0000 mg | DELAYED_RELEASE_TABLET | Freq: Every day | ORAL | Status: DC
  Administered 2015-06-25 – 2015-07-01 (×7): 40 mg via ORAL
  Filled 2015-06-24 (×7): qty 1

## 2015-06-24 MED ORDER — ATROPINE SULFATE 1 % OP SOLN
1.0000 [drp] | Freq: Every day | OPHTHALMIC | Status: DC | PRN
  Administered 2015-06-29 – 2015-06-30 (×2): 1 [drp] via OPHTHALMIC
  Filled 2015-06-24: qty 2

## 2015-06-24 MED ORDER — ACETAMINOPHEN 500 MG PO TABS: 500.0000 mg | ORAL_TABLET | Freq: Four times a day (QID) | ORAL | Status: DC | PRN

## 2015-06-24 MED ORDER — SODIUM CHLORIDE 0.9 % IV SOLN
1.0000 g | Freq: Once | INTRAVENOUS | Status: AC
  Administered 2015-06-24: 1 g via INTRAVENOUS
  Filled 2015-06-24: qty 10

## 2015-06-24 MED ORDER — RANOLAZINE ER 500 MG PO TB12
500.0000 mg | ORAL_TABLET | Freq: Two times a day (BID) | ORAL | Status: DC
  Administered 2015-06-24 – 2015-07-01 (×14): 500 mg via ORAL
  Filled 2015-06-24 (×14): qty 1

## 2015-06-24 MED ORDER — LEVOTHYROXINE SODIUM 50 MCG PO TABS
50.0000 ug | ORAL_TABLET | Freq: Every day | ORAL | Status: DC
  Administered 2015-06-25 – 2015-07-01 (×7): 50 ug via ORAL
  Filled 2015-06-24 (×7): qty 1

## 2015-06-24 MED ORDER — MECLIZINE HCL 25 MG PO TABS: 25.0000 mg | ORAL_TABLET | Freq: Three times a day (TID) | ORAL | Status: DC | PRN

## 2015-06-24 MED ORDER — ONDANSETRON HCL 4 MG/2ML IJ SOLN: 4.0000 mg | Freq: Four times a day (QID) | INTRAMUSCULAR | Status: DC | PRN

## 2015-06-24 MED ORDER — INSULIN ASPART 100 UNIT/ML ~~LOC~~ SOLN
0.0000 [IU] | Freq: Every day | SUBCUTANEOUS | Status: DC
  Administered 2015-06-27 – 2015-06-28 (×2): 2 [IU] via SUBCUTANEOUS

## 2015-06-24 NOTE — Telephone Encounter (Signed)
New message     Pt c/o Shortness Of Breath: STAT if SOB developed within the last 24 hours or pt is noticeably SOB on the phone  1. Are you currently SOB (can you hear that pt is SOB on the phone)? Per daughter the pt is sob could not hear the pt   2. How long have you been experiencing SOB? Per daughter started this morning  3. Are you SOB when sitting or when up moving around? Per daughter the pt is laying down in a recliner  4. Are you currently experiencing any other symptoms? Per daugther pain in the left arm    The daughter is saying the pt is having pain around the where the had a battery changed for the pacemaker    Per daughter the pt blood pressures  121/79 and pulse 70's Per daughter the o2sat on room air in the 80's

## 2015-06-24 NOTE — ED Provider Notes (Signed)
CSN: [id number]     Arrival date & time 06/24/15  1422 History   First MD Initiated Contact with Patient 06/24/15 1522     Chief Complaint  Patient presents with  . Pacemaker Problem     (Consider location/radiation/quality/duration/timing/severity/associated sxs/prior Treatment) The history is provided by a relative. The history is limited by the condition of the patient (Dementia).  82 year old female had pacemaker battery changed 2 days ago. Family is concerned because her heart rate is in the 59s and previously it had been in the 60s. She did have an episode of some chest discomfort and dyspnea earlier today but is symptom-free currently. Family has not noticed any change in her leg swelling. She's not had any fever or chills. One family member expresses a lot of concern about the calcium and the micro-environment of the heart, and also concerned that she is not getting enough vitamin K to and how this is related to electrical fields in the home.  Past Medical History  Diagnosis Date  . Hypertension   . Legally blind   . CKD (chronic kidney disease), stage III     a. iii - iv.  . Anemia   . Hypothyroid   . Gastric ulcer   . Hiatal hernia   . Dyspepsia   . UTI (lower urinary tract infection)   . H/O: GI bleed 12//13  . High cholesterol   . Chronic combined systolic and diastolic CHF (congestive heart failure) ([place])     a. 01/2014 Echo: EF @ least mod-sev reduced with HK of lat/apical, basal inf walls. basalpost AK, Gr 1DD.  Marland Kitchen DVT of upper extremity (deep vein thrombosis) ([place]) 06/13/2012    BUE  . Seasonal allergies   . Allergy to perfume   . Type II diabetes mellitus ([place])   . History of blood transfusion 2013  . GERD (gastroesophageal reflux disease)   . Daily headache   . Arthritis   . PAF (paroxysmal atrial fibrillation) (HCC)     a. CHA2DS2VASc = 7-->coumadin.  . 2Nd degree atrioventricular block     a. s/p MDT ADDR01 DC PPM (ser #: [id number]).  . Coronary artery  disease     a. 1994 s/p cabg;  b. 09/2013 MV: large, sev intensity, partially reversible inf, apical defect, prior inf/ap infarct w/ mild peri-infarct ischemia->Med Rx.  . Presence of permanent cardiac pacemaker    Past Surgical History  Procedure Laterality Date  . Esophagogastroduodenoscopy  12/22/2011    Procedure: ESOPHAGOGASTRODUODENOSCOPY (EGD);  Surgeon: Gatha Mayer, MD;  Location: [facility];  Service: Endoscopy;  Laterality: N/A;  . Cardioversion  06/06/06    successful  . Insert / replace / remove pacemaker  06/29/2006    Medtronic adapta  . Cardiac catheterization  10/25/92  . Cardiac catheterization  11/18/03    w/grafts 100%CX LAD 80 & 100%  . Cardiac catheterization  01/24/05    diffuse disease of native vessels  . Cardiac catheterization  06/06/06    severe native CAD  . Coronary artery bypass graft  10/27/92    LIMA to LAD,SVG to LAD second diagonal,obtuse maraginal of the CX and posterior descendingbranch of the RCA  . Cataract extraction w/ intraocular lens  implant, bilateral Bilateral   . Refractive surgery Bilateral   . Ep implantable device N/A 06/22/2015    Procedure: PPM/BIV PPM Generator Changeout;  Surgeon: Sanda Klein, MD;  Location: [facility];  Service: Cardiovascular;  Laterality: N/A;   Family History  Problem Relation  Age of Onset  . Breast cancer Daughter   . Diabetes Son   . Diabetes Daughter   . Heart disease Father   . Diabetes Father   . Colon polyps Daughter   . Heart attack Brother   . Diabetes Brother   . Stroke Sister   . Colon cancer Neg Hx    Social History  Substance Use Topics  . Smoking status: Never Smoker   . Smokeless tobacco: Never Used  . Alcohol Use: No   OB History    No data available     Review of Systems  Unable to perform ROS: Dementia  All other systems reviewed and are negative.     Allergies  [place]; Digoxin and related; Penicillins; Percocet; Percodan; and Vicodin  Home Medications   Prior  to Admission medications   Medication Sig Start Date End Date Taking? Authorizing Provider  acetaminophen (TYLENOL) 500 MG tablet Take 500 mg by mouth every 6 (six) hours as needed (pain).    Historical Provider, MD  albuterol (PROAIR HFA) 108 (90 Base) MCG/ACT inhaler Inhale 2 puffs into the lungs every 6 (six) hours as needed for wheezing or shortness of breath.    Historical Provider, MD  atropine 1 % ophthalmic solution Place 1 drop into both eyes daily as needed (burning eyes).  10/29/13   Historical Provider, MD  Blood Glucose Monitoring Suppl (ONE TOUCH ULTRA 2) w/Device KIT Use to check blood sugar 2 times per day dx code E11.65 02/23/15   Elayne Snare, MD  cholecalciferol (VITAMIN D) 1000 units tablet Take 1,000 Units by mouth daily.    Historical Provider, MD  donepezil (ARICEPT) 5 MG tablet Take 5 mg by mouth at bedtime.  07/13/14   Historical Provider, MD  folic acid (FOLVITE) 1 MG tablet Take 1 tablet (1 mg total) by mouth daily. Patient not taking: Reported on 06/21/2015 01/19/14   Sanda Klein, MD  folic acid (FOLVITE) 1 MG tablet take 1 tablet by mouth once daily 05/19/15   Sanda Klein, MD  furosemide (LASIX) 40 MG tablet Take 1 tablet (40 mg total) by mouth daily. 06/16/15   Mihai Croitoru, MD  gabapentin (NEURONTIN) 100 MG capsule Take 100 mg by mouth 2 (two) times daily.     Historical Provider, MD  glipiZIDE (GLUCOTROL) 5 MG tablet TAKE ONE-HALF TABLET BY  MOUTH DAILY ONLY IF BLOOD  SUGAR IS OVER 200 Patient taking differently: TAKE ONE-HALF TABLET BY  MOUTH DAILY ONLY IF BLOOD  SUGAR IS 300 OR MORE 12/14/14   Elayne Snare, MD  glucose blood (ONE TOUCH ULTRA TEST) test strip Use as instructed to check blood sugar 2 times per day dx code E11.65 02/23/15   Elayne Snare, MD  guaifenesin (MUCUS RELIEF CHEST CONGESTION) 400 MG TABS tablet Take 400 mg by mouth 2 (two) times daily as needed (cough/ congestion).    Historical Provider, MD  loratadine (CLARITIN) 10 MG tablet Take 10 mg by mouth daily.     Historical Provider, MD  meclizine (ANTIVERT) 25 MG tablet Take 25 mg by mouth 3 (three) times daily as needed for dizziness.    Historical Provider, MD  nitroGLYCERIN (NITRODUR - DOSED IN MG/24 HR) 0.2 mg/hr patch Place 1 patch (0.2 mg total) onto the skin daily. 12/21/14   Mihai Croitoru, MD  omeprazole (PRILOSEC) 20 MG capsule Take 20 mg by mouth daily before breakfast.     Historical Provider, MD  OVER THE COUNTER MEDICATION Take 1 Can by mouth daily. Enterex  nutritional supplement - with choline    Historical Provider, MD  OVER THE COUNTER MEDICATION Take 1 tablet by mouth 2 (two) times daily with a meal. FD Guard    Historical Provider, MD  OVER THE COUNTER MEDICATION Take 1 tablet by mouth daily. Micro-algae Puryear    Historical Provider, MD  OVER THE COUNTER MEDICATION Take 1 capsule by mouth daily. Choline 100 mg, trimethylglycine 50 mg, glutathione 50 mg, methyltetrahydrofolate, l-arginine-, l-ornithine, l-citruline    Historical Provider, MD  OVER THE COUNTER MEDICATION Take 1 tablet by mouth daily. calcide 3 - enzymes    Historical Provider, MD  Polyvinyl Alcohol-Povidone (REFRESH OP) Place 1 drop into both eyes daily as needed (dry eyes).     Historical Provider, MD  potassium chloride (K-DUR) 10 MEQ tablet take 1 tablet by mouth twice a day 06/16/15   Sanda Klein, MD  potassium chloride (K-DUR,KLOR-CON) 10 MEQ tablet Take 1 tablet by mouth two  times daily Patient not taking: Reported on 06/21/2015 12/04/14   Dani Gobble Croitoru, MD  ranolazine ([name]) 500 MG 12 hr tablet Take 1 tablet (500 mg total) by mouth 2 (two) times daily. 05/12/15   Mihai Croitoru, MD  SYNTHROID 50 MCG tablet take 1 tablet by mouth once daily BEFORE BREAKFAST 02/19/15   Elayne Snare, MD  traMADol (ULTRAM) 50 MG tablet Take 1 tablet (50 mg total) by mouth every 6 (six) hours as needed for moderate pain. Patient taking differently: Take 50 mg by mouth 2 (two) times daily as needed for moderate pain.  08/22/14   Orson Eva, MD   warfarin (COUMADIN) 3 MG tablet Take 1 to 1.5 tablets by mouth daily or as directed by coumadin clinic Patient taking differently: Take 3-4.5 mg by mouth daily after supper. Take 1 and 1/2 tablets (4.5 mg) on Monday, Wednesday and Friday, take 1 tablet (3 mg) on Sunday, Tuesday, Thursday, Saturday 03/08/15   Dani Gobble Croitoru, MD  zolpidem (AMBIEN) 5 MG tablet Take 0.5 tablets (2.5 mg total) by mouth at bedtime. sleep Patient not taking: Reported on 06/21/2015 08/22/14   Orson Eva, MD   BP 103/56 mmHg  Pulse 82  Temp(Src) 98.1 F (36.7 C)  Resp 20  SpO2 96% Physical Exam  Nursing note and vitals reviewed.  82 year old female, resting comfortably and in no acute distress. Vital signs are normal. Oxygen saturation is 96%, which is normal. Head is normocephalic and atraumatic. PERRLA, EOMI. Oropharynx is clear. Neck is nontender and supple without adenopathy or JVD. Back is nontender and there is no CVA tenderness. Lungs are clear without rales, wheezes, or rhonchi. Chest is nontender. Heart has regular rate and rhythm without murmur. Abdomen is soft, flat, nontender without masses or hepatosplenomegaly and peristalsis is normoactive. Extremities have 1-2+ edema, full range of motion is present. Skin is warm and dry without rash. Neurologic: She is drowsy but arousable, oriented to person but not place or time, cranial nerves are intact, there are no motor or sensory deficits.  ED Course  Procedures (including critical care time) Labs Review Results for orders placed or performed during the hospital encounter of 06/24/15  CBC with Differential  Result Value Ref Range   WBC 6.9 4.0 - 10.5 K/uL   RBC 3.74 (L) 3.87 - 5.11 MIL/uL   Hemoglobin 10.7 (L) 12.0 - 15.0 g/dL   HCT 34.3 (L) 36.0 - 46.0 %   MCV 91.7 78.0 - 100.0 fL   MCH 28.6 26.0 - 34.0 pg   MCHC 31.2  30.0 - 36.0 g/dL   RDW 14.6 11.5 - 15.5 %   Platelets 110 (L) 150 - 400 K/uL   Neutrophils Relative % 71 %   Neutro Abs 4.9 1.7 -  7.7 K/uL   Lymphocytes Relative 21 %   Lymphs Abs 1.5 0.7 - 4.0 K/uL   Monocytes Relative 7 %   Monocytes Absolute 0.5 0.1 - 1.0 K/uL   Eosinophils Relative 1 %   Eosinophils Absolute 0.1 0.0 - 0.7 K/uL   Basophils Relative 0 %   Basophils Absolute 0.0 0.0 - 0.1 K/uL  Basic metabolic panel  Result Value Ref Range   Sodium 135 135 - 145 mmol/L   Potassium 5.6 (H) 3.5 - 5.1 mmol/L   Chloride 103 101 - 111 mmol/L   CO2 26 22 - 32 mmol/L   Glucose, Bld 190 (H) 65 - 99 mg/dL   BUN 18 6 - 20 mg/dL   Creatinine, Ser 1.24 (H) 0.44 - 1.00 mg/dL   Calcium 8.7 (L) 8.9 - 10.3 mg/dL   GFR calc non Af Amer 37 (L) >60 mL/min   GFR calc Af Amer 42 (L) >60 mL/min   Anion gap 6 5 - 15  Magnesium  Result Value Ref Range   Magnesium 2.0 1.7 - 2.4 mg/dL  Brain natriuretic peptide  Result Value Ref Range   B Natriuretic Peptide 372.7 (H) 0.0 - 100.0 pg/mL  Troponin I  Result Value Ref Range   Troponin I 0.12 (H) <0.031 ng/mL   I have personally reviewed and evaluated these lab results as part of my medical decision-making.   EKG Interpretation   Date/Time:  Thursday June 24 2015 14:29:29 EDT Ventricular Rate:  84 PR Interval:  202 QRS Duration: 203 QT Interval:  481 QTC Calculation: 569 R Axis:   -71 Text Interpretation:  VENTRICULAR PACED RHYTHM When compared with ECG of  06/22/2015, No significant change was found Reconfirmed by [facility]  MD, Girl Schissler  ([id number]) on 06/24/2015 3:21:42 PM      MDM   Final diagnoses:  Elevated troponin I level  Weakness  Renal insufficiency  Thrombocytopenia (HCC)  Normochromic normocytic anemia  Chronic combined systolic and diastolic CHF (congestive heart failure) ([place])    Family concern about pacemaker function. Pacemaker appears to be functioning normally. I discussed her case with her cardiologist, Dr. Sallyanne Kuster, who states that her pacemaker is atrial sensing of ventricular firing and that her heart rate would be based on what her atrial rate is. She  is in no distress now. Screening labs will be obtained. Family is reassured regarding her pacemaker function.  Laboratory workup does show mildly elevated troponin. I wonder if this might be related to a recent pacemaker greater change. Renal insufficiency and anemia are unchanged from baseline. Case is discussed with Dr. Tana Coast [facility] who agrees to admit the patient for serial troponins. This will be under observation status. Cardiology is consulted as well.  Delora Fuel, MD 94/17/40 [id number]

## 2015-06-24 NOTE — ED Notes (Signed)
MD at bedside. 

## 2015-06-24 NOTE — H&P (Signed)
History and Physical        Hospital Admission Note Date: 06/24/2015  Patient name: [name] Medical record number: [id number] Date of birth: 23-Aug-1920 Age: 82 y.o. Gender: female  PCP: [name], MD   Referring physician: Dr Roxanne Mins  Patient coming from: home    Chief Complaint:  Chest pain today and "high heart rate than 60"   HPI: Patient is a 82 year old female with history of complete heart block status post pacemaker with recent dual-chamber generator change out on 6/6, hypogonadism, hypertension, hyperlipidemia, diabetes mellitus, CAD, chronic kidney disease stage III presented to ED with episode of chest pain today. History was obtained from her multiple family members in her room. Per family members, they were concerned that her heart rate was in's 80s and prior to the pacemaker generator change it always had been in 61s. This morning, patient complained of chest discomfort first in the area of the pacemaker then radiating to the left chest and left arm. Patient was not able to qualify the chest pain. The episode was also associated with shortness of breath, patient was noticed to be clammy and diaphoretic by the family members. The whole episode lasted for 30-40 minutes and spontaneously resolved. No nausea, vomiting, productive cough or fevers or chills. At the time of my encounter, patient is back to her baseline with no chest pain.   ED work-up/course:  EKG showed paced rhythm, troponin 0.12, BNP 372, sodium 135, potassium 5.6, 1.2, on 5/3 was 1.4. CBC with hemoglobin of 10.7, at baseline.  Review of Systems: Positives marked in 'bold' Constitutional: Denies fever, chills, diaphoresis today, fatigue.  HEENT: Denies photophobia, eye pain, redness, hearing loss, ear pain, congestion, sore throat, rhinorrhea, sneezing, mouth sores, trouble swallowing, neck  pain, neck stiffness and tinnitus.   Respiratory: The patient also has chronic shortness of breath, worsened today during the episode of the chest pain Cardiovascular: Please see history of present illness Gastrointestinal: Denies nausea, vomiting, abdominal pain, blood in stool and abdominal distention. + constipation Genitourinary: Denies dysuria, urgency, frequency, hematuria, flank pain and difficulty urinating.  Musculoskeletal: Denies myalgias, back pain, joint swelling, arthralgias. Patient is wheelchair bound and needs assistance with all her ADLs Skin: Denies pallor, rash and wound.  Neurological: Denies dizziness, seizures, syncope, light-headedness, numbness and headaches.  Hematological: Denies adenopathy. Easy bruising, personal or family bleeding history  Psychiatric/Behavioral: Denies suicidal ideation, mood changes, confusion, nervousness, sleep disturbance and agitation  Past Medical History: Past Medical History  Diagnosis Date  . Hypertension   . Legally blind   . CKD (chronic kidney disease), stage III     a. iii - iv.  . Anemia   . Hypothyroid   . Gastric ulcer   . Hiatal hernia   . Dyspepsia   . UTI (lower urinary tract infection)   . H/O: GI bleed 12//13  . High cholesterol   . Chronic combined systolic and diastolic CHF (congestive heart failure) ([place])     a. 01/2014 Echo: EF @ least mod-sev reduced with HK of lat/apical, basal inf walls. basalpost AK, Gr 1DD.  Marland Kitchen DVT of upper extremity (deep vein thrombosis) ([place]) 06/13/2012    BUE  .  Seasonal allergies   . Allergy to perfume   . Type II diabetes mellitus ([place])   . History of blood transfusion 2013  . GERD (gastroesophageal reflux disease)   . Daily headache   . Arthritis   . PAF (paroxysmal atrial fibrillation) ([facility])     a. CHA2DS2VASc = 7-->coumadin.  . 2Nd degree atrioventricular block     a. s/p MDT ADDR01 DC PPM (ser #: [id number]).  . Coronary artery disease     a. 1994 s/p cabg;  b. 09/2013 MV:  large, sev intensity, partially reversible inf, apical defect, prior inf/ap infarct w/ mild peri-infarct ischemia->Med Rx.  . Presence of permanent cardiac pacemaker     Past Surgical History  Procedure Laterality Date  . Esophagogastroduodenoscopy  12/22/2011    Procedure: ESOPHAGOGASTRODUODENOSCOPY (EGD);  Surgeon: Gatha Mayer, MD;  Location: [facility];  Service: Endoscopy;  Laterality: N/A;  . Cardioversion  06/06/06    successful  . Insert / replace / remove pacemaker  06/29/2006    Medtronic adapta  . Cardiac catheterization  10/25/92  . Cardiac catheterization  11/18/03    w/grafts 100%CX LAD 80 & 100%  . Cardiac catheterization  01/24/05    diffuse disease of native vessels  . Cardiac catheterization  06/06/06    severe native CAD  . Coronary artery bypass graft  10/27/92    LIMA to LAD,SVG to LAD second diagonal,obtuse maraginal of the CX and posterior descendingbranch of the RCA  . Cataract extraction w/ intraocular lens  implant, bilateral Bilateral   . Refractive surgery Bilateral   . Ep implantable device N/A 06/22/2015    Procedure: PPM/BIV PPM Generator Changeout;  Surgeon: Sanda Klein, MD;  Location: [facility];  Service: Cardiovascular;  Laterality: N/A;    Medications: Prior to Admission medications   Medication Sig Start Date End Date Taking? Authorizing Provider  acetaminophen (TYLENOL) 500 MG tablet Take 500 mg by mouth every 6 (six) hours as needed (pain).    Historical Provider, MD  albuterol (PROAIR HFA) 108 (90 Base) MCG/ACT inhaler Inhale 2 puffs into the lungs every 6 (six) hours as needed for wheezing or shortness of breath.    Historical Provider, MD  atropine 1 % ophthalmic solution Place 1 drop into both eyes daily as needed (burning eyes).  10/29/13   Historical Provider, MD  Blood Glucose Monitoring Suppl (ONE TOUCH ULTRA 2) w/Device KIT Use to check blood sugar 2 times per day dx code E11.65 02/23/15   Elayne Snare, MD  cholecalciferol (VITAMIN  D) 1000 units tablet Take 1,000 Units by mouth daily.    Historical Provider, MD  donepezil (ARICEPT) 5 MG tablet Take 5 mg by mouth at bedtime.  07/13/14   Historical Provider, MD  folic acid (FOLVITE) 1 MG tablet Take 1 tablet (1 mg total) by mouth daily. Patient not taking: Reported on 06/21/2015 01/19/14   Sanda Klein, MD  folic acid (FOLVITE) 1 MG tablet take 1 tablet by mouth once daily 05/19/15   Sanda Klein, MD  furosemide (LASIX) 40 MG tablet Take 1 tablet (40 mg total) by mouth daily. 06/16/15   Mihai Croitoru, MD  gabapentin (NEURONTIN) 100 MG capsule Take 100 mg by mouth 2 (two) times daily.     Historical Provider, MD  glipiZIDE (GLUCOTROL) 5 MG tablet TAKE ONE-HALF TABLET BY  MOUTH DAILY ONLY IF BLOOD  SUGAR IS OVER 200 Patient taking differently: TAKE ONE-HALF TABLET BY  MOUTH DAILY ONLY IF BLOOD  SUGAR IS 300  OR MORE 12/14/14   Elayne Snare, MD  glucose blood (ONE TOUCH ULTRA TEST) test strip Use as instructed to check blood sugar 2 times per day dx code E11.65 02/23/15   Elayne Snare, MD  guaifenesin (MUCUS RELIEF CHEST CONGESTION) 400 MG TABS tablet Take 400 mg by mouth 2 (two) times daily as needed (cough/ congestion).    Historical Provider, MD  loratadine (CLARITIN) 10 MG tablet Take 10 mg by mouth daily.    Historical Provider, MD  meclizine (ANTIVERT) 25 MG tablet Take 25 mg by mouth 3 (three) times daily as needed for dizziness.    Historical Provider, MD  nitroGLYCERIN (NITRODUR - DOSED IN MG/24 HR) 0.2 mg/hr patch Place 1 patch (0.2 mg total) onto the skin daily. 12/21/14   Mihai Croitoru, MD  omeprazole (PRILOSEC) 20 MG capsule Take 20 mg by mouth daily before breakfast.     Historical Provider, MD  OVER THE COUNTER MEDICATION Take 1 Can by mouth daily. Enterex nutritional supplement - with choline    Historical Provider, MD  OVER THE COUNTER MEDICATION Take 1 tablet by mouth 2 (two) times daily with a meal. FD Guard    Historical Provider, MD  OVER THE COUNTER MEDICATION Take 1  tablet by mouth daily. Micro-algae [place]    Historical Provider, MD  OVER THE COUNTER MEDICATION Take 1 capsule by mouth daily. Choline 100 mg, trimethylglycine 50 mg, glutathione 50 mg, methyltetrahydrofolate, l-arginine-, l-ornithine, l-citruline    Historical Provider, MD  OVER THE COUNTER MEDICATION Take 1 tablet by mouth daily. calcide 3 - enzymes    Historical Provider, MD  Polyvinyl Alcohol-Povidone (REFRESH OP) Place 1 drop into both eyes daily as needed (dry eyes).     Historical Provider, MD  potassium chloride (K-DUR) 10 MEQ tablet take 1 tablet by mouth twice a day 06/16/15   Sanda Klein, MD  potassium chloride (K-DUR,KLOR-CON) 10 MEQ tablet Take 1 tablet by mouth two  times daily Patient not taking: Reported on 06/21/2015 12/04/14   Dani Gobble Croitoru, MD  ranolazine ([name]) 500 MG 12 hr tablet Take 1 tablet (500 mg total) by mouth 2 (two) times daily. 05/12/15   Mihai Croitoru, MD  SYNTHROID 50 MCG tablet take 1 tablet by mouth once daily BEFORE BREAKFAST 02/19/15   Elayne Snare, MD  traMADol (ULTRAM) 50 MG tablet Take 1 tablet (50 mg total) by mouth every 6 (six) hours as needed for moderate pain. Patient taking differently: Take 50 mg by mouth 2 (two) times daily as needed for moderate pain.  08/22/14   Orson Eva, MD  warfarin (COUMADIN) 3 MG tablet Take 1 to 1.5 tablets by mouth daily or as directed by coumadin clinic Patient taking differently: Take 3-4.5 mg by mouth daily after supper. Take 1 and 1/2 tablets (4.5 mg) on Monday, Wednesday and Friday, take 1 tablet (3 mg) on Sunday, Tuesday, Thursday, Saturday 03/08/15   Dani Gobble Croitoru, MD  zolpidem (AMBIEN) 5 MG tablet Take 0.5 tablets (2.5 mg total) by mouth at bedtime. sleep Patient not taking: Reported on 06/21/2015 08/22/14   Orson Eva, MD    Allergies:   Allergies  Allergen Reactions  . Darvon Other (See Comments)    confusion  . Digoxin And Related Nausea Only  . Penicillins Other (See Comments)    Was told had allergy from  childhood... Unknown reaction  . Percocet [Oxycodone-Acetaminophen] Other (See Comments)    confusion  . Percodan [Oxycodone-Aspirin] Other (See Comments)    confusion  . Vicodin [Hydrocodone-Acetaminophen]  Other (See Comments)    confusion    Social History:  reports that she has never smoked. She has never used smokeless tobacco. She reports that she does not drink alcohol or use illicit drugs.Patient currently lives at home with her family members, needs assistance with all her ADLs, wheelchair bound. Does not ambulate  Family History: Family History  Problem Relation Age of Onset  . Breast cancer Daughter   . Diabetes Son   . Diabetes Daughter   . Heart disease Father   . Diabetes Father   . Colon polyps Daughter   . Heart attack Brother   . Diabetes Brother   . Stroke Sister   . Colon cancer Neg Hx     Physical Exam: Blood pressure 127/77, pulse 81, temperature 98.1 F (36.7 C), resp. rate 14, SpO2 98 %. General: Alert, awake, oriented x self, person, in no acute distress, at baseline per family members. HEENT: normocephalic, atraumatic, anicteric sclera, pink conjunctiva, pupils equal and reactive to light and accomodation, oropharynx clear Neck: supple, no masses or lymphadenopathy, no goiter, no bruits  Heart: Regular rate and rhythm, without murmurs, rubs or gallops. Lungs: Clear to auscultation bilaterally, no wheezing, rales or rhonchi. Pacemaker in the left chest wall Abdomen: Soft, nontender, nondistended, positive bowel sounds, no masses. Extremities: No clubbing, cyanosis or edema with positive pedal pulses. Neuro: Grossly intact, no focal neurological deficits, strength 5/5 upper and lower extremities bilaterally Psych: alert and oriented x 2, normal mood and affect Skin: no rashes or lesions, warm and dry   LABS on Admission:  Basic Metabolic Panel:  Recent Labs Lab 06/24/15 1600  NA 135  K 5.6*  CL 103  CO2 26  GLUCOSE 190*  BUN 18  CREATININE  1.24*  CALCIUM 8.7*  MG 2.0   Liver Function Tests: No results for input(s): AST, ALT, ALKPHOS, BILITOT, PROT, ALBUMIN in the last 168 hours. No results for input(s): LIPASE, AMYLASE in the last 168 hours. No results for input(s): AMMONIA in the last 168 hours. CBC:  Recent Labs Lab 06/24/15 1600  WBC 6.9  NEUTROABS 4.9  HGB 10.7*  HCT 34.3*  MCV 91.7  PLT 110*   Cardiac Enzymes:  Recent Labs Lab 06/24/15 1600  TROPONINI 0.12*   BNP: Invalid input(s): POCBNP CBG:  Recent Labs Lab 06/22/15 1303 06/22/15 1626  GLUCAP 83 112*    Radiological Exams on Admission:  No results found.  *I have personally reviewed the images above*  EKG: Independently reviewed. Paced rhythm with prolonged QTC 569   Assessment/Plan Principal Problem:  Atypical Chest pain: Has a history of CAD, CABG in 1994. Myoview abnormal Aug 2015- medical Rx - Admit for observation, telemetry, first set of cardiac enzymes +0.12 - Obtain serial cardiac enzymes, 2-D echocardiogram. Prior echo 8/16 had shown EF of 30-35% with grade 1 diastolic dysfunction - EDP, Dr Roxanne Mins requested cardiology consult  Active Problems:    History of complete heart block with Pacemaker - Recent generator change out, cardiology consulted, will defer to cardiology if pacemaker needs to be interrogated    Hyperkalemia - Likely due to potassium supplementation outpatient - Hold Kdur, will give 1 dose of Kayexalate and calcium gluconate    Diabetes mellitus type 2, uncontrolled ([place]) - Place on sliding scale insulin    Hypothyroidism - Continue outpatient dose of Synthroid    PAF (paroxysmal atrial fibrillation) ([place]) - Not on any rate controlling medications, has pacemaker - On warfarin per pharmacy  CKD (chronic kidney disease), stage III - Baseline creatinine 1-1.5 - Currently stable, hold Lasix  GERD - Continue PPI  DVT prophylaxis: Warfarin   CODE STATUS: Discussed with family members, full  CODE STATUS  Consults called:  Cardiology  Family Communication: Admission, patients condition and plan of care including tests being ordered have been discussed with the patient and multiple family members  who indicates understanding and agree with the plan and Code Status  Admission status: Admitted, telemetry  Disposition plan: Further plan will depend as patient's clinical course evolves and further radiologic and laboratory data become available. Likely home in a.m. if medically stable   Time Spent on Admission: 11mns   RAI,RIPUDEEP M.D. [facility] 06/24/2015, 5:45 PM Pager: [phone] If 7PM-7AM, please contact night-coverage [email or web address] Password [email or web address]

## 2015-06-24 NOTE — ED Notes (Signed)
[facility] from home, defib battery changed Tuesday, previous rate 60, no is 80s, family concerned, no focal complaints, 134/82

## 2015-06-24 NOTE — Telephone Encounter (Addendum)
Spoke to caller. Pt having shortness of breath and chest pain. She had procedure on Tuesday for PM battery change out. She is having some residual soreness from this, but states new sharp pain on opposite side of chest which began today. She also endorses SOB. She has some chronic respiratory concerns, but daughter notes SOB observed is not typical.  Today daughter noted O2 sats in 32s. Pt was SOB this morning, somewhat improved but still SOB this afternoon.  Daughter discussed w/ me, and is asking for recommendations.  Advised ER evaluation at [facility], since I can't reasonably determine from the narrative given what is new vs chronic regarding SOB and since the CP seems to be worse today than yesterday. Caller voiced understanding. I have informed Trish, cardmaster, of pending arrival of patient from home via private vehicle.

## 2015-06-24 NOTE — Telephone Encounter (Signed)
Patient's daughter calling regarding wound care s/p generator replacement 06/22/15. I have advised her to remove the tegaderm and gauze dressing. Leave staples in place, no ointment or dressing needs to be put on the incision. She verbalizes understanding. Confirmed wound check appt 07/01/15 at 10am.

## 2015-06-24 NOTE — Progress Notes (Addendum)
ANTICOAGULATION CONSULT NOTE - Initial Consult  Pharmacy Consult for Coumadin Indication: atrial fibrillation  Allergies  Allergen Reactions  . Darvon Other (See Comments)    confusion  . Digoxin And Related Nausea Only  . Penicillins Other (See Comments)    Was told had allergy from childhood... Unknown reaction  . Percocet [Oxycodone-Acetaminophen] Other (See Comments)    confusion  . Percodan [Oxycodone-Aspirin] Other (See Comments)    confusion  . Vicodin [Hydrocodone-Acetaminophen] Other (See Comments)    confusion    Patient Measurements: Height: 5\' 4"  (162.6 cm) Weight: 193 lb 14.4 oz (87.952 kg) IBW/kg (Calculated) : 54.7  Vital Signs: Temp: 98.4 F (36.9 C) (06/08 2002) Temp Source: Oral (06/08 2002) BP: 106/74 mmHg (06/08 2002) Pulse Rate: 74 (06/08 2002)  Labs:  Recent Labs  06/22/15 1352 06/24/15 1600 06/24/15 2001  HGB  --  10.7*  --   HCT  --  34.3*  --   PLT  --  110*  --   LABPROT 27.2*  --  22.5*  INR 2.56*  --  1.99*  CREATININE  --  1.24*  --   TROPONINI  --  0.12*  --     Estimated Creatinine Clearance: 31.1 mL/min (by C-G formula based on Cr of 1.24).   Medical History: Past Medical History  Diagnosis Date  . Hypertension   . Legally blind   . CKD (chronic kidney disease), stage III     a. iii - iv.  . Anemia   . Hypothyroid   . Gastric ulcer   . Hiatal hernia   . Dyspepsia   . UTI (lower urinary tract infection)   . H/O: GI bleed 12//13  . High cholesterol   . Chronic combined systolic and diastolic CHF (congestive heart failure) ([place])     a. 01/2014 Echo: EF @ least mod-sev reduced with HK of lat/apical, basal inf walls. basalpost AK, Gr 1DD.  Marland Kitchen DVT of upper extremity (deep vein thrombosis) ([place]) 06/13/2012    BUE  . Seasonal allergies   . Allergy to perfume   . Type II diabetes mellitus ([place])   . History of blood transfusion 2013  . GERD (gastroesophageal reflux disease)   . Daily headache   . Arthritis   . PAF  (paroxysmal atrial fibrillation) ([facility])     a. CHA2DS2VASc = 7-->coumadin.  . 2Nd degree atrioventricular block     a. s/p MDT ADDR01 DC PPM (ser #: [id number]).  . Coronary artery disease     a. 1994 s/p cabg;  b. 09/2013 MV: large, sev intensity, partially reversible inf, apical defect, prior inf/ap infarct w/ mild peri-infarct ischemia->Med Rx.  . Presence of permanent cardiac pacemaker     Assessment: 82 yo F presents on 6/8 with CP. PMH includes Afib on Coumadin 3mg  daily exc 4.5mg  on MWF. Was on hold for a few days this past week for a PPM generator changeout on 6/6. Although INR on day of procedure was therapeutic at 2.56. INR on admit is 1.99. Called room and patient has not taken a dose of coumadin today. Hgb 10.7, plts 110. No s/s of bleed.  Goal of Therapy:  INR 2-3 Monitor platelets by anticoagulation protocol: Yes   Plan:  Give Coumadin 3mg  PO x 1 tonight Monitor daily INR, CBC, s/s of bleed   Dezmon Conover J 06/24/2015,8:48 PM

## 2015-06-25 ENCOUNTER — Encounter: Payer: Self-pay | Admitting: Cardiovascular Disease

## 2015-06-25 ENCOUNTER — Observation Stay (HOSPITAL_BASED_OUTPATIENT_CLINIC_OR_DEPARTMENT_OTHER): Payer: PPO

## 2015-06-25 DIAGNOSIS — I5043 Acute on chronic combined systolic (congestive) and diastolic (congestive) heart failure: Secondary | ICD-10-CM

## 2015-06-25 DIAGNOSIS — I209 Angina pectoris, unspecified: Secondary | ICD-10-CM

## 2015-06-25 DIAGNOSIS — N183 Chronic kidney disease, stage 3 (moderate): Secondary | ICD-10-CM

## 2015-06-25 DIAGNOSIS — R079 Chest pain, unspecified: Secondary | ICD-10-CM | POA: Diagnosis not present

## 2015-06-25 DIAGNOSIS — I5042 Chronic combined systolic (congestive) and diastolic (congestive) heart failure: Secondary | ICD-10-CM | POA: Diagnosis not present

## 2015-06-25 DIAGNOSIS — I480 Paroxysmal atrial fibrillation: Secondary | ICD-10-CM

## 2015-06-25 DIAGNOSIS — R7989 Other specified abnormal findings of blood chemistry: Secondary | ICD-10-CM | POA: Diagnosis not present

## 2015-06-25 DIAGNOSIS — I442 Atrioventricular block, complete: Secondary | ICD-10-CM | POA: Diagnosis not present

## 2015-06-25 LAB — CBC
HCT: 33.7 % — ABNORMAL LOW (ref 36.0–46.0)
Hemoglobin: 10.5 g/dL — ABNORMAL LOW (ref 12.0–15.0)
MCH: 28 pg (ref 26.0–34.0)
MCHC: 31.2 g/dL (ref 30.0–36.0)
MCV: 89.9 fL (ref 78.0–100.0)
PLATELETS: 128 10*3/uL — AB (ref 150–400)
RBC: 3.75 MIL/uL — AB (ref 3.87–5.11)
RDW: 14.6 % (ref 11.5–15.5)
WBC: 5.9 10*3/uL (ref 4.0–10.5)

## 2015-06-25 LAB — ECHOCARDIOGRAM COMPLETE
E/e' ratio: 37.5
EWDT: 151 ms
FS: 37 % (ref 28–44)
Height: 64 in
IVS/LV PW RATIO, ED: 0.93
LA ID, A-P, ES: 38 mm
LADIAMINDEX: 1.99 cm/m2
LAVOLA4C: 60.8 mL
LDCA: 2.27 cm2
LEFT ATRIUM END SYS DIAM: 38 mm
LV E/e' medial: 37.5
LV E/e'average: 37.5
LV PW d: 11.1 mm — AB (ref 0.6–1.1)
LV TDI E'MEDIAL: 3.69
LV e' LATERAL: 3.44 cm/s
LVOTD: 17 mm
MV Dec: 151
MV Peak grad: 7 mmHg
MV pk A vel: 139 m/s
MV pk E vel: 129 m/s
RV TAPSE: 16.3 mm
TDI e' lateral: 3.44
Weight: 3024 oz

## 2015-06-25 LAB — GLUCOSE, CAPILLARY
GLUCOSE-CAPILLARY: 141 mg/dL — AB (ref 65–99)
GLUCOSE-CAPILLARY: 170 mg/dL — AB (ref 65–99)
Glucose-Capillary: 172 mg/dL — ABNORMAL HIGH (ref 65–99)
Glucose-Capillary: 126 mg/dL — ABNORMAL HIGH (ref 65–99)

## 2015-06-25 LAB — BASIC METABOLIC PANEL
Anion gap: 8 (ref 5–15)
BUN: 15 mg/dL (ref 6–20)
CALCIUM: 9.1 mg/dL (ref 8.9–10.3)
CO2: 29 mmol/L (ref 22–32)
CREATININE: 1.18 mg/dL — AB (ref 0.44–1.00)
Chloride: 102 mmol/L (ref 101–111)
GFR calc Af Amer: 45 mL/min — ABNORMAL LOW (ref >60)
GFR calc non Af Amer: 39 mL/min — ABNORMAL LOW (ref >60)
GLUCOSE: 141 mg/dL — AB (ref 65–99)
Potassium: 4 mmol/L (ref 3.5–5.1)
Sodium: 139 mmol/L (ref 135–145)

## 2015-06-25 LAB — TROPONIN I
TROPONIN I: 0.25 ng/mL — AB (ref <0.031)
TROPONIN I: 0.18 ng/mL — AB (ref <0.031)

## 2015-06-25 LAB — PROTIME-INR
INR: 2.05 — ABNORMAL HIGH (ref 0.00–1.49)
Prothrombin Time: 23 seconds — ABNORMAL HIGH (ref 11.6–15.2)

## 2015-06-25 MED ORDER — PERFLUTREN LIPID MICROSPHERE
1.0000 mL | INTRAVENOUS | Status: AC | PRN
  Administered 2015-06-25: 3 mL via INTRAVENOUS
  Filled 2015-06-25: qty 10

## 2015-06-25 MED ORDER — WARFARIN SODIUM 2.5 MG PO TABS
4.5000 mg | ORAL_TABLET | Freq: Once | ORAL | Status: AC
  Administered 2015-06-25: 4.5 mg via ORAL
  Filled 2015-06-25: qty 1

## 2015-06-25 MED ORDER — ASPIRIN 81 MG PO CHEW
81.0000 mg | CHEWABLE_TABLET | Freq: Once | ORAL | Status: AC
  Administered 2015-06-25: 81 mg via ORAL
  Filled 2015-06-25: qty 1

## 2015-06-25 MED ORDER — ATORVASTATIN CALCIUM 20 MG PO TABS
20.0000 mg | ORAL_TABLET | Freq: Every day | ORAL | Status: DC
  Administered 2015-06-25 – 2015-07-01 (×7): 20 mg via ORAL
  Filled 2015-06-25 (×7): qty 1

## 2015-06-25 MED ORDER — FUROSEMIDE 10 MG/ML IJ SOLN
40.0000 mg | Freq: Two times a day (BID) | INTRAMUSCULAR | Status: DC
  Administered 2015-06-25 – 2015-06-29 (×8): 40 mg via INTRAVENOUS
  Filled 2015-06-25 (×8): qty 4

## 2015-06-25 NOTE — Consult Note (Signed)
CARDIOLOGY CONSULT NOTE   Patient ID: [name] MRN: [id number] DOB/AGE: [birth year] 82 y.o.  Admit date: 06/24/2015  Primary Physician   [name], MD Primary Cardiologist: Dr. Sallyanne Kuster Requesting MD: Dr. Tana Coast Reason for Consultation: Chest pain  HPI: [name] is a 82 year old female with a past medical history of HTN, CKD stage III, HLD, chronic combined systolic and diastolic CHF, DM, PAF,high-grade second-degree AV block/complete heart block pacemaker dependent and CAD s/p CABG in 1994. Her dual-chamber device is programmed VVIR due to permanent atrial fibrillation. She is on chronic warfarin anticoagulation.  [name] has some dementia so most of her history is provided by the family. She had a pacemaker battery change out on 06/22/15. Her family reports that they are concerned that her heart rate is now in the 80's and before the battery change it was in the 60's. Dr. Sallyanne Kuster confirmed that pacemaker is functioning properly and rate is based on her atrial rate.   Her troponin is mildly elevated at 0.12. Only one drawn. She denies chest pain and says she never had any chest pain, however she has dementia and is not a reliable historian. Her daughter states that the patient had chest pain yesterday morning with radiation to her left arm. She was SOB with this episode and noted to be diaphoretic. It lasted for about 30-40 minutes. She has had no further chest pain since admission. At the time of my exam she reports that she does feel tender at the pace maker site from recent procedure.   She had a Myoview in August of 2015 that was high risk stress nuclear study with large, severe intensity, partially reversible, inferior and apical defect consistent with prior inferior apical infarct and mild peri-infarct ischemia towards the apex; perfusion similar to previous study; study high risk due to reduced LV function.  Her last echo was in August of 2016, her EF was 30-35% with  akinesis of basal and mid inferolateral,basal inferior wall and hypokinesis of the basal and mid anterolateral walls.    Past Medical History  Diagnosis Date  . Hypertension   . Legally blind   . CKD (chronic kidney disease), stage III     a. iii - iv.  . Anemia   . Hypothyroid   . Gastric ulcer   . Hiatal hernia   . Dyspepsia   . UTI (lower urinary tract infection)   . H/O: GI bleed 12//13  . High cholesterol   . Chronic combined systolic and diastolic CHF (congestive heart failure) ([place])     a. 01/2014 Echo: EF @ least mod-sev reduced with HK of lat/apical, basal inf walls. basalpost AK, Gr 1DD.  Marland Kitchen DVT of upper extremity (deep vein thrombosis) ([place]) 06/13/2012    BUE  . Seasonal allergies   . Allergy to perfume   . Type II diabetes mellitus ([place])   . History of blood transfusion 2013  . GERD (gastroesophageal reflux disease)   . Daily headache   . Arthritis   . PAF (paroxysmal atrial fibrillation) ([facility])     a. CHA2DS2VASc = 7-->coumadin.  . 2Nd degree atrioventricular block     a. s/p MDT ADDR01 DC PPM (ser #: [id number]).  . Coronary artery disease     a. 1994 s/p cabg;  b. 09/2013 MV: large, sev intensity, partially reversible inf, apical defect, prior inf/ap infarct w/ mild peri-infarct ischemia->Med Rx.  . Presence of permanent cardiac pacemaker  Past Surgical History  Procedure Laterality Date  . Esophagogastroduodenoscopy  12/22/2011    Procedure: ESOPHAGOGASTRODUODENOSCOPY (EGD);  Surgeon: Gatha Mayer, MD;  Location: [facility];  Service: Endoscopy;  Laterality: N/A;  . Cardioversion  06/06/06    successful  . Insert / replace / remove pacemaker  06/29/2006    Medtronic adapta  . Cardiac catheterization  10/25/92  . Cardiac catheterization  11/18/03    w/grafts 100%CX LAD 80 & 100%  . Cardiac catheterization  01/24/05    diffuse disease of native vessels  . Cardiac catheterization  06/06/06    severe native CAD  . Coronary artery bypass graft  10/27/92     LIMA to LAD,SVG to LAD second diagonal,obtuse maraginal of the CX and posterior descendingbranch of the RCA  . Cataract extraction w/ intraocular lens  implant, bilateral Bilateral   . Refractive surgery Bilateral   . Ep implantable device N/A 06/22/2015    Procedure: PPM/BIV PPM Generator Changeout;  Surgeon: Sanda Klein, MD;  Location: [facility];  Service: Cardiovascular;  Laterality: N/A;    Allergies  Allergen Reactions  . Darvon Other (See Comments)    confusion  . Digoxin And Related Nausea Only  . Penicillins Other (See Comments)    Was told had allergy from childhood... Unknown reaction  . Percocet [Oxycodone-Acetaminophen] Other (See Comments)    confusion  . Percodan [Oxycodone-Aspirin] Other (See Comments)    confusion  . Vicodin [Hydrocodone-Acetaminophen] Other (See Comments)    confusion    I have reviewed the patient's current medications . cholecalciferol  1,000 Units Oral Daily  . donepezil  5 mg Oral QHS  . folic acid  1 mg Oral Daily  . gabapentin  100 mg Oral BID  . insulin aspart  0-5 Units Subcutaneous QHS  . insulin aspart  0-9 Units Subcutaneous TID WC  . levothyroxine  50 mcg Oral QAC breakfast  . loratadine  10 mg Oral Daily  . pantoprazole  40 mg Oral Daily  . ranolazine  500 mg Oral BID  . Warfarin - Pharmacist Dosing Inpatient   Does not apply q1800     acetaminophen, albuterol, atropine, gi cocktail, meclizine, ondansetron (ZOFRAN) IV, traMADol  Prior to Admission medications   Medication Sig Start Date End Date Taking? Authorizing Provider  acetaminophen (TYLENOL) 500 MG tablet Take 500 mg by mouth every 6 (six) hours as needed (pain).    Historical Provider, MD  albuterol (PROAIR HFA) 108 (90 Base) MCG/ACT inhaler Inhale 2 puffs into the lungs every 6 (six) hours as needed for wheezing or shortness of breath.    Historical Provider, MD  atropine 1 % ophthalmic solution Place 1 drop into both eyes daily as needed (burning eyes).   10/29/13   Historical Provider, MD  Blood Glucose Monitoring Suppl (ONE TOUCH ULTRA 2) w/Device KIT Use to check blood sugar 2 times per day dx code E11.65 02/23/15   Elayne Snare, MD  cholecalciferol (VITAMIN D) 1000 units tablet Take 1,000 Units by mouth daily.    Historical Provider, MD  donepezil (ARICEPT) 5 MG tablet Take 5 mg by mouth at bedtime.  07/13/14   Historical Provider, MD  folic acid (FOLVITE) 1 MG tablet Take 1 tablet (1 mg total) by mouth daily. Patient not taking: Reported on 06/21/2015 01/19/14   Sanda Klein, MD  folic acid (FOLVITE) 1 MG tablet take 1 tablet by mouth once daily 05/19/15   Sanda Klein, MD  furosemide (LASIX) 40 MG tablet Take  1 tablet (40 mg total) by mouth daily. 06/16/15   Mihai Croitoru, MD  gabapentin (NEURONTIN) 100 MG capsule Take 100 mg by mouth 2 (two) times daily.     Historical Provider, MD  glipiZIDE (GLUCOTROL) 5 MG tablet TAKE ONE-HALF TABLET BY  MOUTH DAILY ONLY IF BLOOD  SUGAR IS OVER 200 Patient taking differently: TAKE ONE-HALF TABLET BY  MOUTH DAILY ONLY IF BLOOD  SUGAR IS 300 OR MORE 12/14/14   Elayne Snare, MD  glucose blood (ONE TOUCH ULTRA TEST) test strip Use as instructed to check blood sugar 2 times per day dx code E11.65 02/23/15   Elayne Snare, MD  guaifenesin (MUCUS RELIEF CHEST CONGESTION) 400 MG TABS tablet Take 400 mg by mouth 2 (two) times daily as needed (cough/ congestion).    Historical Provider, MD  loratadine (CLARITIN) 10 MG tablet Take 10 mg by mouth daily.    Historical Provider, MD  meclizine (ANTIVERT) 25 MG tablet Take 25 mg by mouth 3 (three) times daily as needed for dizziness.    Historical Provider, MD  nitroGLYCERIN (NITRODUR - DOSED IN MG/24 HR) 0.2 mg/hr patch Place 1 patch (0.2 mg total) onto the skin daily. 12/21/14   Mihai Croitoru, MD  omeprazole (PRILOSEC) 20 MG capsule Take 20 mg by mouth daily before breakfast.     Historical Provider, MD  OVER THE COUNTER MEDICATION Take 1 Can by mouth daily. Enterex nutritional  supplement - with choline    Historical Provider, MD  OVER THE COUNTER MEDICATION Take 1 tablet by mouth 2 (two) times daily with a meal. FD Guard    Historical Provider, MD  OVER THE COUNTER MEDICATION Take 1 tablet by mouth daily. Micro-algae [place]    Historical Provider, MD  OVER THE COUNTER MEDICATION Take 1 capsule by mouth daily. Choline 100 mg, trimethylglycine 50 mg, glutathione 50 mg, methyltetrahydrofolate, l-arginine-, l-ornithine, l-citruline    Historical Provider, MD  OVER THE COUNTER MEDICATION Take 1 tablet by mouth daily. calcide 3 - enzymes    Historical Provider, MD  Polyvinyl Alcohol-Povidone (REFRESH OP) Place 1 drop into both eyes daily as needed (dry eyes).     Historical Provider, MD  potassium chloride (K-DUR) 10 MEQ tablet take 1 tablet by mouth twice a day 06/16/15   Sanda Klein, MD  potassium chloride (K-DUR,KLOR-CON) 10 MEQ tablet Take 1 tablet by mouth two  times daily Patient not taking: Reported on 06/21/2015 12/04/14   Dani Gobble Croitoru, MD  ranolazine ([name]) 500 MG 12 hr tablet Take 1 tablet (500 mg total) by mouth 2 (two) times daily. 05/12/15   Mihai Croitoru, MD  SYNTHROID 50 MCG tablet take 1 tablet by mouth once daily BEFORE BREAKFAST 02/19/15   Elayne Snare, MD  traMADol (ULTRAM) 50 MG tablet Take 1 tablet (50 mg total) by mouth every 6 (six) hours as needed for moderate pain. Patient taking differently: Take 50 mg by mouth 2 (two) times daily as needed for moderate pain.  08/22/14   Orson Eva, MD  warfarin (COUMADIN) 3 MG tablet Take 1 to 1.5 tablets by mouth daily or as directed by coumadin clinic Patient taking differently: Take 3-4.5 mg by mouth daily after supper. Take 1 and 1/2 tablets (4.5 mg) on Monday, Wednesday and Friday, take 1 tablet (3 mg) on Sunday, Tuesday, Thursday, Saturday 03/08/15   Dani Gobble Croitoru, MD  zolpidem (AMBIEN) 5 MG tablet Take 0.5 tablets (2.5 mg total) by mouth at bedtime. sleep Patient not taking: Reported on 06/21/2015 08/22/14  Orson Eva, MD      Social History   Social History  . Marital Status: Widowed    Spouse Name: N/A  . Number of Children: 10  . Years of Education: N/A   Occupational History  .     Social History Main Topics  . Smoking status: Never Smoker   . Smokeless tobacco: Never Used  . Alcohol Use: No  . Drug Use: No  . Sexual Activity: No   Other Topics Concern  . Not on file   Social History Narrative    Family Status  Relation Status Death Age  . Father Deceased   . Mother Deceased   . Sister Deceased   . Brother Deceased    Family History  Problem Relation Age of Onset  . Breast cancer Daughter   . Diabetes Son   . Diabetes Daughter   . Heart disease Father   . Diabetes Father   . Colon polyps Daughter   . Heart attack Brother   . Diabetes Brother   . Stroke Sister   . Colon cancer Neg Hx      ROS:  Full 14 point review of systems complete and found to be negative unless listed above.  Physical Exam: Blood pressure 124/65, pulse 74, temperature 98.3 F (36.8 C), temperature source Oral, resp. rate 18, height '5\' 4"'$  (1.626 m), weight 189 lb (85.73 kg), SpO2 100 %.  General: Well developed, well nourished, female in no acute distress Head: Eyes PERRLA, No xanthomas.   Normocephalic and atraumatic, oropharynx without edema or exudate. Lungs: CTA Heart: HRRR S1 S2, no rub/gallop, no murmur. pulses are 2+ extrem.   Neck: No carotid bruits. No lymphadenopathy.  No JVD. Abdomen: Bowel sounds present, abdomen soft and non-tender without masses or hernias noted. Msk:  No spine or cva tenderness. No weakness, no joint deformities or effusions. Extremities: No clubbing or cyanosis. +2 BLE edema Neuro: Alert and oriented X 3. No focal deficits noted. Psych:  Good affect, responds appropriately Skin: No rashes or lesions noted.  Labs:   Lab Results  Component Value Date   WBC 6.9 06/24/2015   HGB 10.7* 06/24/2015   HCT 34.3* 06/24/2015   MCV 91.7 06/24/2015   PLT 110* 06/24/2015      Recent Labs  06/24/15 2001  INR 1.99*    Recent Labs Lab 06/24/15 1600  NA 135  K 5.6*  CL 103  CO2 26  BUN 18  CREATININE 1.24*  CALCIUM 8.7*  GLUCOSE 190*   MAGNESIUM  Date Value Ref Range Status  06/24/2015 2.0 1.7 - 2.4 mg/dL Final    Recent Labs  06/24/15 1600  TROPONINI 0.12*     TSH  Date/Time Value Ref Range Status  06/08/2015 11:19 AM 7.96* mIU/L Final    Comment:      Reference Range   > or = 20 Years  0.40-4.50   Pregnancy Range First trimester  0.26-2.66 Second trimester 0.55-2.73 Third trimester  0.43-2.91     08/19/2014 02:50 AM 3.116 0.350 - 4.500 uIU/mL Final    Echo: pending  ECG:  V paced  Radiology:  No results found.  ASSESSMENT AND PLAN:    Principal Problem:   Chest pain Active Problems:   Coronary artery disease, CABG in 1994. Myoview abnormal Aug 2015- medical Rx   Pacemaker   Hyperkalemia   Diabetes mellitus type 2, uncontrolled ([facility])   Hypothyroidism   PAF (paroxysmal atrial fibrillation) ([facility])   Complete heart block ([place])  CKD (chronic kidney disease), stage III   Elevated troponin I level   1. Chest pain/elevated troponin: Patient presents after one episode of chest pain with radiation to her left arm. She was SOB and diaphoretic for about 20-30 minutes. Patient denies chest pain since admission. She recently had a battery change out of her pacemaker so it is unclear if he chest pain is ischemic in nature or related to the pace maker site. Patient does have mildly elevated troponin, we will cycle to assess for trend. However, troponin elevation could be related to recent (2 days ago) battery change out. Patient is a poor historian due to dementia.   She did have an abnormal Myoview in 2015, medical therapy was reccommended that that time as the family did not want cath and patient had elevated Cr at that time of 1.23 per note on 09/15/13. She is on [name] at home.  2. PAF: patient on coumadin. Her INR was 1.99  yesterday, unfortunately this am she was stuck 3 times and lab was unable to obtain a sample. Family refused labs after that.   3. DM: On sliding scale, A1C pending.    Signed: Arbutus Leas, NP 06/25/2015 7:24 AM Pager [phone]  Co-Sign MD

## 2015-06-25 NOTE — Progress Notes (Signed)
Nutrition Brief Note  Patient identified on the Malnutrition Screening Tool (MST) Report  Wt Readings from Last 15 Encounters:  06/25/15 189 lb (85.73 kg)  06/22/15 186 lb (84.369 kg)  03/30/15 186 lb 11.7 oz (84.7 kg)  01/20/15 175 lb (79.379 kg)  12/24/14 179 lb 1.6 oz (81.239 kg)  12/07/14 172 lb (78.019 kg)  10/05/14 172 lb (78.019 kg)  09/17/14 175 lb 9.6 oz (79.652 kg)  08/22/14 192 lb 14.4 oz (87.5 kg)  07/30/14 181 lb 12.8 oz (82.464 kg)  07/21/14 173 lb (78.472 kg)  07/10/14 189 lb (85.73 kg)  06/26/14 189 lb (85.73 kg)  06/25/14 187 lb 6.4 oz (85.004 kg)  06/12/14 184 lb 6.4 oz (83.643 kg)    Body mass index is 32.43 kg/(m^2). Patient meets criteria for Obesity based on current BMI. MST filed inaccurately- no reports of weight loss or decreased appetite per MST report. Pt asleep at time of visit, she appears well nourished. Family at bedside reports that patient has been eating normally and due to being more sedentary and due to fluid retention pt has gained weight from her usual weight of 175 lbs. 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   Current diet order is Carb Modified, patient is consuming approximately 100% of meals at this time. Labs and medications reviewed.   No nutrition interventions warranted at this time. If nutrition issues arise, please consult RD.   Scarlette Ar RD, LDN Inpatient Clinical Dietitian Pager: [phone] After Hours Pager: [phone]

## 2015-06-25 NOTE — Care Management Obs Status (Signed)
[facility] NOTIFICATION   Patient Details  Name: [name] MRN: [id number] Date of Birth: [birth year]   Medicare Observation Status Notification Given:  Yes    Royston Bake, RN 06/25/2015, 10:31 AM

## 2015-06-25 NOTE — Progress Notes (Signed)
Author called to the room with family concern in regards to patient wheezing while eating. Upon assessment no wheezing noted. Patient denies shortness of breath and no coughing during meal. Patient and family requesting prn breathing treatment. RT notified.

## 2015-06-25 NOTE — Consult Note (Signed)
   [facility] CM Inpatient Consult   06/25/2015  [name] 1920-04-14 [id number]   Patient screened for potential [facility] Management services. Patient is eligible for [facility] Care Management services under patient's [employer] Medicare  Plan. Patient was asleep but grandson, [name] was at the bedside.  Brochure and brief description of care management services explained. Per MD notes in chart reveals that thepPatient is a 82 year old female with history of complete heart block status post pacemaker with recent dual-chamber generator change out on 6/6, hypogonadism, hypertension, hyperlipidemia, diabetes mellitus, CAD, chronic kidney disease stage III presented to ED with episode of chest pain.  Chart review revealed that the patient was active with [facility] and inpatient RNCM had arranged EMMI HF calls. No further care management needs identified.  Brochure and contact information left with grandson.     For questions contact:   Natividad Brood, RN BSN [facility] Liaison  [phone] business mobile phone Toll free office [phone]

## 2015-06-25 NOTE — Progress Notes (Signed)
ANTICOAGULATION CONSULT NOTE - Follow Up Consult  Pharmacy Consult for Warfarin Indication: atrial fibrillation  Allergies  Allergen Reactions  . Darvon Other (See Comments)    confusion  . Digoxin And Related Nausea Only  . Penicillins Other (See Comments)    Was told had allergy from childhood... Unknown reaction  . Percocet [Oxycodone-Acetaminophen] Other (See Comments)    confusion  . Percodan [Oxycodone-Aspirin] Other (See Comments)    confusion  . Vicodin [Hydrocodone-Acetaminophen] Other (See Comments)    confusion    Patient Measurements: Height: 5\' 4"  (162.6 cm) Weight: 189 lb (85.73 kg) IBW/kg (Calculated) : 54.7  Vital Signs: Temp: 98.3 F (36.8 C) (06/09 0932) Temp Source: Oral (06/09 0932) BP: 115/60 mmHg (06/09 0932) Pulse Rate: 76 (06/09 0932)  Labs:  Recent Labs  06/22/15 1352 06/24/15 1600 06/24/15 2001  HGB  --  10.7*  --   HCT  --  34.3*  --   PLT  --  110*  --   LABPROT 27.2*  --  22.5*  INR 2.56*  --  1.99*  CREATININE  --  1.24*  --   TROPONINI  --  0.12*  --     Estimated Creatinine Clearance: 30.7 mL/min (by C-G formula based on Cr of 1.24).   Assessment: 82 yo F presents on 6/8 with CP. PMH includes Afib on Coumadin 3mg  daily exc 4.5mg  on MWF. Was on hold for a few days this past week for a PPM generator changeout on 6/6. Although INR on day of procedure was therapeutic at 2.56. INR on admit is 1.99. Last dose PTA was on 6/7  INR today is therapeutic (INR 2.05 << 1.99, goal of 2-3). Hgb 10.5 << 10.7, plts 128 << 110. No overt s/sx of bleeding noted.   PTA dose was 3 mg daily EXCEPT for 4.5 mg on MWF per family (anticoag clinic states 4.5 only on M/F). Re-education was done with the daughter today.  Goal of Therapy:  INR 2-3   Plan:  1. Warfarin 4.5 mg x 1 dose at 1800 today 2. Daily PT/INR 3. Will continue to monitor for any signs/symptoms of bleeding and will follow up with PT/INR in the a.m.   Alycia Rossetti, PharmD,  BCPS Clinical Pharmacist Pager: [phone] 06/25/2015 10:48 AM

## 2015-06-25 NOTE — Discharge Instructions (Signed)

## 2015-06-25 NOTE — Progress Notes (Signed)
[facility]                                                                              Patient Demographics  [name], is a 82 y.o. female, DOB - [birth year], [id number]  Admit date - 06/24/2015   Admitting Physician Ripudeep Krystal Eaton, MD  Outpatient Primary MD for the patient is [name], MD  Outpatient specialists:   LOS -   days    Chief Complaint  Patient presents with  . Pacemaker Problem       Brief summary   Patient is a 82 year old female with history of complete heart block status post pacemaker with recent dual-chamber generator change out on 6/6, hypogonadism, hypertension, hyperlipidemia, diabetes mellitus, CAD, chronic kidney disease stage III presented to ED with episode of chest pain today. History was obtained from her multiple family members in her room. Per family members, they were concerned that her heart rate was in's 80s and prior to the pacemaker generator change it always had been in 46s. This morning, patient complained of chest discomfort first in the area of the pacemaker then radiating to the left chest and left arm. Patient was not able to qualify the chest pain. The episode was also associated with shortness of breath, patient was noticed to be clammy and diaphoretic by the family members. The whole episode lasted for 30-40 minutes and spontaneously resolved. First set of troponin mildly positive.   Assessment & Plan    Atypical Chest pain: Has a history of CAD, CABG in 1994. Myoview abnormal Aug 2015- medical Rx - Resolved. First set of cardiac enzymes mildly positive, patient refused labs after that. -  Obtain serial cardiac enzymes, 2-D echocardiogram. Prior echo 8/16 had shown EF of 30-35% with grade 1 diastolic dysfunction - Cardiology consulted, will follow recommendations  Active Problems:   History of complete heart block with Pacemaker - Recent generator change out, cardiology consulted, will defer to  cardiology if pacemaker needs to be interrogated   Hyperkalemia -BMET pending, patient had refused labs   Diabetes mellitus type 2, uncontrolled ([place]) - Place on sliding scale insulin   Hypothyroidism - Continue outpatient dose of Synthroid   PAF (paroxysmal atrial fibrillation) ([place]) - Not on any rate controlling medications, has pacemaker - On warfarin per pharmacy    CKD (chronic kidney disease), stage III - Baseline creatinine 1-1.5 - Currently stable, hold Lasix  GERD - Continue PPI   Code Status: Full CODE STATUS  DVT Prophylaxis: Warfarin   Family Communication: Discussed in detail with the patient, all imaging results, lab results explained to the patient and family members in the room   Disposition Plan: Awaiting echo and cardiology recommendations  Time Spent in minutes   25 minutes  Procedures:  None  Consultants:   Cardiology  Antimicrobials:      Medications  Scheduled Meds: . cholecalciferol  1,000 Units Oral Daily  . donepezil  5 mg Oral QHS  . folic acid  1 mg Oral Daily  . gabapentin  100 mg Oral BID  . insulin aspart  0-5 Units Subcutaneous QHS  . insulin aspart  0-9 Units Subcutaneous TID WC  . levothyroxine  50 mcg Oral QAC breakfast  . loratadine  10 mg Oral Daily  . pantoprazole  40 mg Oral Daily  . ranolazine  500 mg Oral BID  . Warfarin - Pharmacist Dosing Inpatient   Does not apply q1800   Continuous Infusions:  PRN Meds:.acetaminophen, albuterol, atropine, gi cocktail, meclizine, ondansetron (ZOFRAN) IV, traMADol   Antibiotics   Anti-infectives    None        Subjective:   Jyrah Puryear was seen and examined today. No more episodes of chest pain or shortness of breath. Patient denies dizziness, abdominal pain, N/V/D/C, new weakness, numbess, tingling. No acute events overnight.    Objective:   Filed Vitals:   06/25/15 0007 06/25/15 [id number] 06/25/15 0932 06/25/15 0942  BP: 122/66 124/65 115/60   Pulse:  71 74 76   Temp: 98.8 F (37.1 C) 98.3 F (36.8 C) 98.3 F (36.8 C)   TempSrc: Oral Oral Oral   Resp: 20 18 18    Height:      Weight:  85.73 kg (189 lb)    SpO2: 100% 100% 99% 99%    Intake/Output Summary (Last 24 hours) at 06/25/15 1038 Last data filed at 06/25/15 0951  Gross per 24 hour  Intake    660 ml  Output      0 ml  Net    660 ml     Wt Readings from Last 3 Encounters:  06/25/15 85.73 kg (189 lb)  06/22/15 84.369 kg (186 lb)  03/30/15 84.7 kg (186 lb 11.7 oz)     Exam  General: Alert and oriented x 3, NAD, Pleasant and conversing  HEENT:    Neck: Supple, no JVD  Cardiovascular: S1 S2 auscultated, no rubs, murmurs or gallops. Regular rate and rhythm.  Respiratory: Clear to auscultation bilaterally, no wheezing, rales or rhonchi  Gastrointestinal: Soft, nontender, nondistended, + bowel sounds  Ext: no cyanosis clubbing or edema  Neuro: no new deficits  Skin: No rashes  Psych: Normal affect and demeanor, alert and oriented x3    Data Reviewed:  I have personally reviewed following labs and imaging studies  Micro Results Recent Results (from the past 240 hour(s))  Surgical pcr screen     Status: Abnormal   Collection Time: 06/22/15  1:56 PM  Result Value Ref Range Status   MRSA, PCR NEGATIVE NEGATIVE Final   Staphylococcus aureus POSITIVE (A) NEGATIVE Final    Comment:        The Xpert SA Assay (FDA approved for NASAL specimens in patients over 32 years of age), is one component of a comprehensive surveillance program.  Test performance has been validated by [facility] for patients greater than or equal to 74 year old. It is not intended to diagnose infection nor to guide or monitor treatment.     Radiology Reports No results found.  Lab Data:  CBC:  Recent Labs Lab 06/24/15 1600  WBC 6.9  NEUTROABS 4.9  HGB 10.7*  HCT 34.3*  MCV 91.7  PLT [id number]*   Basic Metabolic Panel:  Recent Labs Lab 06/24/15 1600  NA 135  K  5.6*  CL 103  CO2 26  GLUCOSE 190*  BUN 18  CREATININE 1.24*  CALCIUM 8.7*  MG 2.0   GFR: Estimated Creatinine Clearance: 30.7 mL/min (by C-G formula based on Cr of 1.24). Liver Function Tests: No results for input(s): AST, ALT, ALKPHOS, BILITOT, PROT, ALBUMIN in the last 168 hours. No results  for input(s): LIPASE, AMYLASE in the last 168 hours. No results for input(s): AMMONIA in the last 168 hours. Coagulation Profile:  Recent Labs Lab 06/21/15 06/22/15 1352 06/24/15 2001  INR 2.6 2.56* 1.99*   Cardiac Enzymes:  Recent Labs Lab 06/24/15 1600  TROPONINI 0.12*   BNP (last 3 results) No results for input(s): PROBNP in the last 8760 hours. HbA1C: No results for input(s): HGBA1C in the last 72 hours. CBG:  Recent Labs Lab 06/22/15 1303 06/22/15 1626 06/24/15 2002 06/25/15 0623  GLUCAP 83 112* 141* 172*   Lipid Profile: No results for input(s): CHOL, HDL, LDLCALC, TRIG, CHOLHDL, LDLDIRECT in the last 72 hours. Thyroid Function Tests: No results for input(s): TSH, T4TOTAL, FREET4, T3FREE, THYROIDAB in the last 72 hours. Anemia Panel: No results for input(s): VITAMINB12, FOLATE, FERRITIN, TIBC, IRON, RETICCTPCT in the last 72 hours. Urine analysis:    Component Value Date/Time   COLORURINE AMBER* 03/29/2015 2212   APPEARANCEUR TURBID* 03/29/2015 2212   LABSPEC 1.015 03/29/2015 2212   PHURINE 6.5 03/29/2015 2212   GLUCOSEU NEGATIVE 03/29/2015 2212   GLUCOSEU NEGATIVE 04/01/2013 1550   HGBUR MODERATE* 03/29/2015 2212   BILIRUBINUR NEGATIVE 03/29/2015 2212   KETONESUR NEGATIVE 03/29/2015 2212   PROTEINUR NEGATIVE 03/29/2015 2212   UROBILINOGEN 1.0 08/16/2014 1521   NITRITE NEGATIVE 03/29/2015 2212   LEUKOCYTESUR LARGE* 03/29/2015 2212     RAI,RIPUDEEP M.D. [facility] 06/25/2015, 10:38 AM  Pager: [phone] Between 7am to 7pm - call Pager - [phone]  After 7pm go to [email or web address] - password [email or web address]  Call night coverage person covering after  7pm

## 2015-06-25 NOTE — Care Management Note (Signed)
Case Management Note  Patient Details  Name: [name] MRN: [id number] Date of Birth: [birth year]  Subjective/Objective:      Admitted with Chest pain              Action/Plan: Patient lives at home with her family, CM talked to her daughter at bedside about DCP; patient is active with [facility] for HHRN/ PT/ aide and is requesting OT to be added to her services. She has a wheelchair at home ; daughter states that she does not need a walker at this time; PCP is Dr Kevan Ny; she has private insurance with [employer] with prescription drug coverage; pharmacy of choice is [facility]; Daughter reports no problem getting her medication; Referral made to [facility] for resumption of [facility] services / fax # [phone];  Expected Discharge Date:       Possibly 06/25/2015           Expected Discharge Plan:  [place]  Discharge planning Services  CM Consult Choice offered to:  Adult Children  HH Arranged:  RN, PT, OT, Nurse's Aide [place] Agency:  [facility]  Status of Service:  In process, will continue to follow  Sherrilyn Rist [phone] 06/25/2015, 10:35 AM

## 2015-06-26 ENCOUNTER — Observation Stay (HOSPITAL_COMMUNITY): Payer: PPO

## 2015-06-26 DIAGNOSIS — I209 Angina pectoris, unspecified: Secondary | ICD-10-CM | POA: Diagnosis not present

## 2015-06-26 DIAGNOSIS — N183 Chronic kidney disease, stage 3 (moderate): Secondary | ICD-10-CM | POA: Diagnosis not present

## 2015-06-26 DIAGNOSIS — R0789 Other chest pain: Secondary | ICD-10-CM | POA: Diagnosis not present

## 2015-06-26 DIAGNOSIS — R7989 Other specified abnormal findings of blood chemistry: Secondary | ICD-10-CM | POA: Diagnosis not present

## 2015-06-26 DIAGNOSIS — I5043 Acute on chronic combined systolic (congestive) and diastolic (congestive) heart failure: Secondary | ICD-10-CM | POA: Diagnosis not present

## 2015-06-26 DIAGNOSIS — I442 Atrioventricular block, complete: Secondary | ICD-10-CM | POA: Diagnosis not present

## 2015-06-26 LAB — PROTIME-INR
INR: 2.25 — ABNORMAL HIGH (ref 0.00–1.49)
Prothrombin Time: 24.7 seconds — ABNORMAL HIGH (ref 11.6–15.2)

## 2015-06-26 LAB — GLUCOSE, CAPILLARY
GLUCOSE-CAPILLARY: 155 mg/dL — AB (ref 65–99)
GLUCOSE-CAPILLARY: 127 mg/dL — AB (ref 65–99)
Glucose-Capillary: 135 mg/dL — ABNORMAL HIGH (ref 65–99)
Glucose-Capillary: 187 mg/dL — ABNORMAL HIGH (ref 65–99)

## 2015-06-26 LAB — BRAIN NATRIURETIC PEPTIDE: B Natriuretic Peptide: 252.8 pg/mL — ABNORMAL HIGH (ref 0.0–100.0)

## 2015-06-26 LAB — HEMOGLOBIN A1C
Hgb A1c MFr Bld: 7.3 % — ABNORMAL HIGH (ref 4.8–5.6)
Mean Plasma Glucose: 163 mg/dL

## 2015-06-26 LAB — C-REACTIVE PROTEIN: CRP: 1.7 mg/dL — AB (ref <1.0)

## 2015-06-26 LAB — SEDIMENTATION RATE: SED RATE: 45 mm/h — AB (ref 0–22)

## 2015-06-26 LAB — URIC ACID: Uric Acid, Serum: 7.9 mg/dL — ABNORMAL HIGH (ref 2.3–6.6)

## 2015-06-26 MED ORDER — PREDNISONE 20 MG PO TABS
40.0000 mg | ORAL_TABLET | Freq: Every day | ORAL | Status: AC
  Administered 2015-06-26 – 2015-06-28 (×2): 40 mg via ORAL
  Filled 2015-06-26 (×4): qty 2

## 2015-06-26 MED ORDER — TRAMADOL HCL 50 MG PO TABS
50.0000 mg | ORAL_TABLET | Freq: Three times a day (TID) | ORAL | Status: DC | PRN
  Administered 2015-06-26 – 2015-06-30 (×5): 50 mg via ORAL
  Filled 2015-06-26 (×7): qty 1

## 2015-06-26 MED ORDER — NAPROXEN 250 MG PO TABS
250.0000 mg | ORAL_TABLET | Freq: Two times a day (BID) | ORAL | Status: DC
Start: 2015-06-26 — End: 2015-06-26

## 2015-06-26 MED ORDER — COLCHICINE 0.6 MG PO TABS
0.6000 mg | ORAL_TABLET | Freq: Every day | ORAL | Status: DC
  Administered 2015-06-26 – 2015-06-27 (×2): 0.6 mg via ORAL
  Filled 2015-06-26 (×2): qty 1

## 2015-06-26 MED ORDER — WARFARIN SODIUM 3 MG PO TABS
3.0000 mg | ORAL_TABLET | Freq: Once | ORAL | Status: AC
  Administered 2015-06-26: 3 mg via ORAL
  Filled 2015-06-26: qty 1

## 2015-06-26 NOTE — Progress Notes (Signed)
ANTICOAGULATION CONSULT NOTE - Follow Up Consult  Pharmacy Consult for Warfarin Indication: atrial fibrillation  Allergies  Allergen Reactions  . Darvon Other (See Comments)    confusion  . Digoxin And Related Nausea Only  . Penicillins Other (See Comments)    Was told had allergy from childhood... Unknown reaction  . Percocet [Oxycodone-Acetaminophen] Other (See Comments)    confusion  . Percodan [Oxycodone-Aspirin] Other (See Comments)    confusion  . Vicodin [Hydrocodone-Acetaminophen] Other (See Comments)    confusion    Patient Measurements: Height: 5\' 4"  (162.6 cm) Weight: 194 lb 9.6 oz (88.27 kg) IBW/kg (Calculated) : 54.7  Vital Signs: Temp: 98.1 F (36.7 C) (06/10 0403) Temp Source: Oral (06/10 0403) BP: 128/65 mmHg (06/10 0403) Pulse Rate: 74 (06/10 0403)  Labs:  Recent Labs  06/24/15 1600 06/24/15 2001 06/25/15 0906 06/25/15 1848 06/26/15 0255  HGB 10.7*  --  10.5*  --   --   HCT 34.3*  --  33.7*  --   --   PLT 110*  --  128*  --   --   LABPROT  --  22.5* 23.0*  --  24.7*  INR  --  1.99* 2.05*  --  2.25*  CREATININE 1.24*  --  1.18*  --   --   TROPONINI 0.12*  --  0.25* 0.18*  --     Estimated Creatinine Clearance: 32.7 mL/min (by C-G formula based on Cr of 1.18).   Assessment: 82 yo F admitted 06/24/2015  With chest pain. Patient has a history of Afib and is on Coumadin PTA. Coumadin was held for a few days the week prior to admission for [facility] generator changeout on 6/6. Last dose PTA was 6/7.  INR 2.25 (therapeutic). Hgb yesterday 10.9, Plt 128. No s/sx of bleeding noted.  PTA dose was 3 mg daily EXCEPT for 4.5 mg on MWF per family (anticoag clinic states 4.5 only on M/F).  Goal of Therapy:  INR 2-3   Plan:  Warfarin 3 mg x 1 tonight Monitor daily INR, CBC and s/sx of bleeding  Dimitri Ped, PharmD. PGY-1 Pharmacy Resident Pager: [phone] 06/26/2015 8:50 AM

## 2015-06-26 NOTE — Progress Notes (Signed)
Primary cardiologist: Dr. Sanda Klein  Seen for followup: Acute on chronic combined heart failure  Subjective:    Patient states that she feels better today, no breathlessness at rest or chest pain.  Objective:   Temp:  [98.1 F (36.7 C)-98.5 F (36.9 C)] 98.1 F (36.7 C) (06/10 0403) Pulse Rate:  [65-74] 74 (06/10 0403) Resp:  [16-18] 16 (06/10 0403) BP: (96-128)/(48-65) 128/65 mmHg (06/10 0403) SpO2:  [96 %-99 %] 99 % (06/10 0403) Weight:  [194 lb 9.6 oz (88.27 kg)] 194 lb 9.6 oz (88.27 kg) (06/10 0403) Last BM Date: 06/25/15  Filed Weights   06/24/15 2002 06/25/15 0632 06/26/15 0403  Weight: 193 lb 14.4 oz (87.952 kg) 189 lb (85.73 kg) 194 lb 9.6 oz (88.27 kg)    Intake/Output Summary (Last 24 hours) at 06/26/15 1119 Last data filed at 06/26/15 0934  Gross per 24 hour  Intake    600 ml  Output      0 ml  Net    600 ml    Telemetry: Ventricular pacing.  Exam:  General: Elderly woman, appears comfortable at rest.  Thorax: Well-healing left upper chest device pocket site.  Lungs: Clear, no wheezing.  Cardiac: RRR without gallop. Soft systolic murmur.  Extremities: Mild leg edema.  Lab Results:  Basic Metabolic Panel:  Recent Labs Lab 06/24/15 1600 06/25/15 0906  NA 135 139  K 5.6* 4.0  CL 103 102  CO2 26 29  GLUCOSE 190* 141*  BUN 18 15  CREATININE 1.24* 1.18*  CALCIUM 8.7* 9.1  MG 2.0  --     CBC:  Recent Labs Lab 06/24/15 1600 06/25/15 0906  WBC 6.9 5.9  HGB 10.7* 10.5*  HCT 34.3* 33.7*  MCV 91.7 89.9  PLT 110* 128*    Cardiac Enzymes:  Recent Labs Lab 06/24/15 1600 06/25/15 0906 06/25/15 1848  TROPONINI 0.12* 0.25* 0.18*    Coagulation:  Recent Labs Lab 06/24/15 2001 06/25/15 0906 06/26/15 0255  INR 1.99* 2.05* 2.25*   Echocardiogram 06/25/2015: Study Conclusions  - Left ventricle: The cavity size was normal. Wall thickness was  increased in a pattern of moderate LVH. Systolic function was  mildly  reduced. The estimated ejection fraction was in the range  of 45% to 50%. Features are consistent with a pseudonormal left  ventricular filling pattern, with concomitant abnormal relaxation  and increased filling pressure (grade 2 diastolic dysfunction). - Aortic valve: Mildly calcified annulus. Mildly thickened, mildly  calcified leaflets.   Medications:   Scheduled Medications: . atorvastatin  20 mg Oral q1800  . cholecalciferol  1,000 Units Oral Daily  . donepezil  5 mg Oral QHS  . folic acid  1 mg Oral Daily  . furosemide  40 mg Intravenous BID  . gabapentin  100 mg Oral BID  . insulin aspart  0-5 Units Subcutaneous QHS  . insulin aspart  0-9 Units Subcutaneous TID WC  . levothyroxine  50 mcg Oral QAC breakfast  . loratadine  10 mg Oral Daily  . pantoprazole  40 mg Oral Daily  . ranolazine  500 mg Oral BID  . warfarin  3 mg Oral ONCE-1800  . Warfarin - Pharmacist Dosing Inpatient   Does not apply q1800     PRN Medications:  acetaminophen, albuterol, atropine, gi cocktail, meclizine, ondansetron (ZOFRAN) IV, traMADol   Assessment:   1. Acute on chronic combined heart failure with LVEF 45-50% and grade 2 diastolic dysfunction. She was assessed by Dr. Oval Linsey yesterday and placed  on IV Lasix. Intake and output are not recorded completely. She does not report any breathlessness.  2. Recent atypical chest discomfort. Minor increase in troponin I is not suggestive of ACS, more than likely a component of demand ischemia in the setting of cardiomyopathy and known underlying ischemic heart disease.  3. Multivessel CAD status post CABG. Noninvasive ischemic testing from 2015 most suggestive of previous inferior apical infarct scar with peri-infarct ischemia. This has been managed medically.  4. History of complete heart block status post [employer] pacemaker placement. Recent generator change by Dr. Sallyanne Kuster. Pocket site looks to be healing well.  5. Paroxysmal atrial  fibrillation, on Coumadin.   Plan/Discussion:    Patient continues on aspirin, Lipitor, [name], and Coumadin. Lasix is at 40 mg IV twice daily for now. Patient states she would like to go home soon. Consider IV diuresis over the next 24 hours and then convert back to her prior oral regimen for discharge. Follow telemetry, rhythm has been stable.   Satira Sark, M.D., F.A.C.C.

## 2015-06-26 NOTE — Progress Notes (Signed)
[facility]                                                                              Patient Demographics  [name], is a 82 y.o. female, DOB - 26-Apr-1920, [id number]  Admit date - 06/24/2015   Admitting Physician Cheyenne Bordeaux Krystal Eaton, MD  Outpatient Primary MD for the patient is [name], MD  Outpatient specialists:   LOS -   days    Chief Complaint  Patient presents with  . Pacemaker Problem       Brief summary   Patient is a 82 year old female with history of complete heart block status post pacemaker with recent dual-chamber generator change out on 6/6, hypogonadism, hypertension, hyperlipidemia, diabetes mellitus, CAD, chronic kidney disease stage III presented to ED with episode of chest pain today. History was obtained from her multiple family members in her room. Per family members, they were concerned that her heart rate was in's 80s and prior to the pacemaker generator change it always had been in 4s. This morning, patient complained of chest discomfort first in the area of the pacemaker then radiating to the left chest and left arm. Patient was not able to qualify the chest pain. The episode was also associated with shortness of breath, patient was noticed to be clammy and diaphoretic by the family members. The whole episode lasted for 30-40 minutes and spontaneously resolved. First set of troponin mildly positive.   Assessment & Plan    Atypical Chest pain: Has a history of CAD, CABG in 1994. Myoview abnormal Aug 2015- medical Rx - Resolved. First set of cardiac enzymes mildly positive, patient refused labs after that. -  Serial troponins improving. - 2-D echocardiogram with EF 03-50%, grade 2 diastolic dysfunction. Prior echo 8/16 had shown EF of 30-35% with grade 1 diastolic dysfunction  Active Problems: Acute on chronic combined systolic and diastolic CHF - cardiology following, placed on IV Lasix - strict I's and O's and daily  weights, still positive balance of 1.2L  Left knee pain, foot pain, leg pain with underlying history of neuropathy - Left knee x-ray, ESR, CRP,  - no prior history of gout   History of complete heart block with Pacemaker - Recent generator change out, cardiology consulted, will defer to cardiology if pacemaker needs to be interrogated   Hyperkalemia -BMET pending, patient had refused labs   Diabetes mellitus type 2, uncontrolled ([place]) - Placed on sliding scale insulin   Hypothyroidism - Continue outpatient dose of Synthroid   PAF (paroxysmal atrial fibrillation) ([place]) - Not on any rate controlling medications, has pacemaker - On warfarin per pharmacy    CKD (chronic kidney disease), stage III - Baseline creatinine 1-1.5  GERD - Continue PPI   Code Status: Full CODE STATUS  DVT Prophylaxis: Warfarin   Family Communication: Discussed in detail with the patient, all imaging results, lab results explained to the patient and family members in the room   Disposition Plan:   Time Spent in minutes   25 minutes  Procedures:  2-D echo  Consultants:   Cardiology  Antimicrobials:      Medications  Scheduled  Meds: . atorvastatin  20 mg Oral q1800  . cholecalciferol  1,000 Units Oral Daily  . donepezil  5 mg Oral QHS  . folic acid  1 mg Oral Daily  . furosemide  40 mg Intravenous BID  . gabapentin  100 mg Oral BID  . insulin aspart  0-5 Units Subcutaneous QHS  . insulin aspart  0-9 Units Subcutaneous TID WC  . levothyroxine  50 mcg Oral QAC breakfast  . loratadine  10 mg Oral Daily  . pantoprazole  40 mg Oral Daily  . ranolazine  500 mg Oral BID  . warfarin  3 mg Oral ONCE-1800  . Warfarin - Pharmacist Dosing Inpatient   Does not apply q1800   Continuous Infusions:  PRN Meds:.acetaminophen, albuterol, atropine, gi cocktail, meclizine, ondansetron (ZOFRAN) IV, traMADol   Antibiotics   Anti-infectives    None        Subjective:   Hailyn  Leet was seen and examined today. No more episodes of chest pain. Complaining of left knee pain, leg and foot pain. Patient denies dizziness, abdominal pain, N/V/D/C, new weakness, numbess, tingling. No acute events overnight.    Objective:   Filed Vitals:   06/25/15 1641 06/25/15 2040 06/25/15 2332 06/26/15 0403  BP: 120/57 96/50 105/48 128/65  Pulse: 74 65 67 74  Temp: 98.5 F (36.9 C) 98.5 F (36.9 C) 98.3 F (36.8 C) 98.1 F (36.7 C)  TempSrc: Oral Oral Oral Oral  Resp: _0 Height:      Weight:    88.27 kg (194 lb 9.6 oz)  SpO2: 96% 98% 98% 99%    Intake/Output Summary (Last 24 hours) at 06/26/15 0935 Last data filed at 06/25/15 1928  Gross per 24 hour  Intake    600 ml  Output      0 ml  Net    600 ml     Wt Readings from Last 3 Encounters:  06/26/15 88.27 kg (194 lb 9.6 oz)  06/22/15 84.369 kg (186 lb)  03/30/15 84.7 kg (186 lb 11.7 oz)     Exam  General: Alert and oriented x 3, NAD, Pleasant and conversing  HEENT:    Neck: Supple, no JVD  Cardiovascular: S1 S2 auscultated, no rubs, murmurs or gallops. Regular rate and rhythm.  Respiratory: Clear to auscultation bilaterally, no wheezing, rales or rhonchi  Gastrointestinal: Soft, nontender, nondistended, + bowel sounds  Ext: no cyanosis clubbing or edema,left knee somewhat swollen, patient complains of pain on ROM  Neuro: no new deficits  Skin: No rashes  Psych: Normal affect and demeanor, alert and oriented x3    Data Reviewed:  I have personally reviewed following labs and imaging studies  Micro Results Recent Results (from the past 240 hour(s))  Surgical pcr screen     Status: Abnormal   Collection Time: 06/22/15  1:56 PM  Result Value Ref Range Status   MRSA, PCR NEGATIVE NEGATIVE Final   Staphylococcus aureus POSITIVE (A) NEGATIVE Final    Comment:        The Xpert SA Assay (FDA approved for NASAL specimens in patients over 19 years of age), is one component of a  comprehensive surveillance program.  Test performance has been validated by [facility] for patients greater than or equal to 35 year old. It is not intended to diagnose infection nor to guide or monitor treatment.     Radiology Reports No results found.  Lab Data:  CBC:  Recent Labs Lab 06/24/15 1600  06/25/15 0906  WBC 6.9 5.9  NEUTROABS 4.9  --   HGB 10.7* 10.5*  HCT 34.3* 33.7*  MCV 91.7 89.9  PLT 110* 500*   Basic Metabolic Panel:  Recent Labs Lab 06/24/15 1600 06/25/15 0906  NA 135 139  K 5.6* 4.0  CL 103 102  CO2 26 29  GLUCOSE 190* 141*  BUN 18 15  CREATININE 1.24* 1.18*  CALCIUM 8.7* 9.1  MG 2.0  --    GFR: Estimated Creatinine Clearance: 32.7 mL/min (by C-G formula based on Cr of 1.18). Liver Function Tests: No results for input(s): AST, ALT, ALKPHOS, BILITOT, PROT, ALBUMIN in the last 168 hours. No results for input(s): LIPASE, AMYLASE in the last 168 hours. No results for input(s): AMMONIA in the last 168 hours. Coagulation Profile:  Recent Labs Lab 06/21/15 06/22/15 1352 06/24/15 2001 06/25/15 0906 06/26/15 0255  INR 2.6 2.56* 1.99* 2.05* 2.25*   Cardiac Enzymes:  Recent Labs Lab 06/24/15 1600 06/25/15 0906 06/25/15 1848  TROPONINI 0.12* 0.25* 0.18*   BNP (last 3 results) No results for input(s): PROBNP in the last 8760 hours. HbA1C:  Recent Labs  06/24/15 2041  HGBA1C 7.3*   CBG:  Recent Labs Lab 06/25/15 0623 06/25/15 1151 06/25/15 1644 06/25/15 2139 06/26/15 0603  GLUCAP 172* 126* 141* 170* 135*   Lipid Profile: No results for input(s): CHOL, HDL, LDLCALC, TRIG, CHOLHDL, LDLDIRECT in the last 72 hours. Thyroid Function Tests: No results for input(s): TSH, T4TOTAL, FREET4, T3FREE, THYROIDAB in the last 72 hours. Anemia Panel: No results for input(s): VITAMINB12, FOLATE, FERRITIN, TIBC, IRON, RETICCTPCT in the last 72 hours. Urine analysis:    Component Value Date/Time   COLORURINE AMBER* 03/29/2015 2212    APPEARANCEUR TURBID* 03/29/2015 2212   LABSPEC 1.015 03/29/2015 2212   PHURINE 6.5 03/29/2015 2212   GLUCOSEU NEGATIVE 03/29/2015 2212   GLUCOSEU NEGATIVE 04/01/2013 1550   HGBUR MODERATE* 03/29/2015 2212   BILIRUBINUR NEGATIVE 03/29/2015 2212   KETONESUR NEGATIVE 03/29/2015 2212   PROTEINUR NEGATIVE 03/29/2015 2212   UROBILINOGEN 1.0 08/16/2014 1521   NITRITE NEGATIVE 03/29/2015 2212   LEUKOCYTESUR LARGE* 03/29/2015 2212     Minerva Bluett M.D. [facility] 06/26/2015, 9:35 AM  Pager: [phone] Between 7am to 7pm - call Pager - [phone]  After 7pm go to [email or web address] - password [email or web address]  Call night coverage person covering after 7pm

## 2015-06-27 ENCOUNTER — Observation Stay (HOSPITAL_COMMUNITY): Payer: PPO

## 2015-06-27 ENCOUNTER — Encounter (HOSPITAL_COMMUNITY): Payer: Self-pay

## 2015-06-27 DIAGNOSIS — E1142 Type 2 diabetes mellitus with diabetic polyneuropathy: Secondary | ICD-10-CM | POA: Diagnosis present

## 2015-06-27 DIAGNOSIS — N183 Chronic kidney disease, stage 3 (moderate): Secondary | ICD-10-CM | POA: Diagnosis present

## 2015-06-27 DIAGNOSIS — I480 Paroxysmal atrial fibrillation: Secondary | ICD-10-CM | POA: Diagnosis present

## 2015-06-27 DIAGNOSIS — K219 Gastro-esophageal reflux disease without esophagitis: Secondary | ICD-10-CM | POA: Diagnosis present

## 2015-06-27 DIAGNOSIS — E1165 Type 2 diabetes mellitus with hyperglycemia: Secondary | ICD-10-CM | POA: Diagnosis present

## 2015-06-27 DIAGNOSIS — Z833 Family history of diabetes mellitus: Secondary | ICD-10-CM | POA: Diagnosis not present

## 2015-06-27 DIAGNOSIS — F0390 Unspecified dementia without behavioral disturbance: Secondary | ICD-10-CM | POA: Diagnosis present

## 2015-06-27 DIAGNOSIS — I5042 Chronic combined systolic (congestive) and diastolic (congestive) heart failure: Secondary | ICD-10-CM | POA: Diagnosis not present

## 2015-06-27 DIAGNOSIS — E1122 Type 2 diabetes mellitus with diabetic chronic kidney disease: Secondary | ICD-10-CM | POA: Diagnosis present

## 2015-06-27 DIAGNOSIS — I25119 Atherosclerotic heart disease of native coronary artery with unspecified angina pectoris: Secondary | ICD-10-CM | POA: Diagnosis present

## 2015-06-27 DIAGNOSIS — T502X5A Adverse effect of carbonic-anhydrase inhibitors, benzothiadiazides and other diuretics, initial encounter: Secondary | ICD-10-CM | POA: Diagnosis not present

## 2015-06-27 DIAGNOSIS — M109 Gout, unspecified: Secondary | ICD-10-CM | POA: Diagnosis present

## 2015-06-27 DIAGNOSIS — E039 Hypothyroidism, unspecified: Secondary | ICD-10-CM | POA: Diagnosis present

## 2015-06-27 DIAGNOSIS — I25111 Atherosclerotic heart disease of native coronary artery with angina pectoris with documented spasm: Secondary | ICD-10-CM | POA: Diagnosis not present

## 2015-06-27 DIAGNOSIS — Z7901 Long term (current) use of anticoagulants: Secondary | ICD-10-CM | POA: Diagnosis not present

## 2015-06-27 DIAGNOSIS — E875 Hyperkalemia: Secondary | ICD-10-CM | POA: Diagnosis present

## 2015-06-27 DIAGNOSIS — E785 Hyperlipidemia, unspecified: Secondary | ICD-10-CM | POA: Diagnosis present

## 2015-06-27 DIAGNOSIS — I442 Atrioventricular block, complete: Secondary | ICD-10-CM | POA: Diagnosis not present

## 2015-06-27 DIAGNOSIS — D696 Thrombocytopenia, unspecified: Secondary | ICD-10-CM | POA: Diagnosis present

## 2015-06-27 DIAGNOSIS — H548 Legal blindness, as defined in USA: Secondary | ICD-10-CM | POA: Diagnosis present

## 2015-06-27 DIAGNOSIS — I214 Non-ST elevation (NSTEMI) myocardial infarction: Secondary | ICD-10-CM | POA: Diagnosis not present

## 2015-06-27 DIAGNOSIS — Z880 Allergy status to penicillin: Secondary | ICD-10-CM | POA: Diagnosis not present

## 2015-06-27 DIAGNOSIS — I130 Hypertensive heart and chronic kidney disease with heart failure and stage 1 through stage 4 chronic kidney disease, or unspecified chronic kidney disease: Secondary | ICD-10-CM | POA: Diagnosis present

## 2015-06-27 DIAGNOSIS — Z888 Allergy status to other drugs, medicaments and biological substances status: Secondary | ICD-10-CM | POA: Diagnosis not present

## 2015-06-27 DIAGNOSIS — Z8249 Family history of ischemic heart disease and other diseases of the circulatory system: Secondary | ICD-10-CM | POA: Diagnosis not present

## 2015-06-27 DIAGNOSIS — Z885 Allergy status to narcotic agent status: Secondary | ICD-10-CM | POA: Diagnosis not present

## 2015-06-27 DIAGNOSIS — Z79899 Other long term (current) drug therapy: Secondary | ICD-10-CM | POA: Diagnosis not present

## 2015-06-27 DIAGNOSIS — Z950 Presence of cardiac pacemaker: Secondary | ICD-10-CM | POA: Diagnosis not present

## 2015-06-27 DIAGNOSIS — Z7984 Long term (current) use of oral hypoglycemic drugs: Secondary | ICD-10-CM | POA: Diagnosis not present

## 2015-06-27 DIAGNOSIS — I5043 Acute on chronic combined systolic (congestive) and diastolic (congestive) heart failure: Secondary | ICD-10-CM | POA: Diagnosis present

## 2015-06-27 DIAGNOSIS — R0789 Other chest pain: Secondary | ICD-10-CM | POA: Diagnosis not present

## 2015-06-27 DIAGNOSIS — R531 Weakness: Secondary | ICD-10-CM | POA: Diagnosis present

## 2015-06-27 DIAGNOSIS — Z951 Presence of aortocoronary bypass graft: Secondary | ICD-10-CM | POA: Diagnosis not present

## 2015-06-27 DIAGNOSIS — Z515 Encounter for palliative care: Secondary | ICD-10-CM | POA: Diagnosis not present

## 2015-06-27 DIAGNOSIS — R072 Precordial pain: Secondary | ICD-10-CM | POA: Diagnosis not present

## 2015-06-27 DIAGNOSIS — I209 Angina pectoris, unspecified: Secondary | ICD-10-CM | POA: Diagnosis not present

## 2015-06-27 DIAGNOSIS — D649 Anemia, unspecified: Secondary | ICD-10-CM | POA: Diagnosis present

## 2015-06-27 DIAGNOSIS — Z86718 Personal history of other venous thrombosis and embolism: Secondary | ICD-10-CM | POA: Diagnosis not present

## 2015-06-27 DIAGNOSIS — Z7189 Other specified counseling: Secondary | ICD-10-CM | POA: Diagnosis not present

## 2015-06-27 DIAGNOSIS — K449 Diaphragmatic hernia without obstruction or gangrene: Secondary | ICD-10-CM | POA: Diagnosis present

## 2015-06-27 DIAGNOSIS — Z993 Dependence on wheelchair: Secondary | ICD-10-CM | POA: Diagnosis not present

## 2015-06-27 LAB — GLUCOSE, CAPILLARY
GLUCOSE-CAPILLARY: 262 mg/dL — AB (ref 65–99)
GLUCOSE-CAPILLARY: 248 mg/dL — AB (ref 65–99)
Glucose-Capillary: 287 mg/dL — ABNORMAL HIGH (ref 65–99)
Glucose-Capillary: 267 mg/dL — ABNORMAL HIGH (ref 65–99)

## 2015-06-27 LAB — CBC
HCT: 37.5 % (ref 36.0–46.0)
HEMOGLOBIN: 11.9 g/dL — AB (ref 12.0–15.0)
MCH: 28.3 pg (ref 26.0–34.0)
MCHC: 31.7 g/dL (ref 30.0–36.0)
MCV: 89.1 fL (ref 78.0–100.0)
PLATELETS: 138 10*3/uL — AB (ref 150–400)
RBC: 4.21 MIL/uL (ref 3.87–5.11)
RDW: 14.6 % (ref 11.5–15.5)
WBC: 4.9 10*3/uL (ref 4.0–10.5)

## 2015-06-27 LAB — PROTIME-INR
INR: 1.86 — AB (ref 0.00–1.49)
Prothrombin Time: 21.4 seconds — ABNORMAL HIGH (ref 11.6–15.2)

## 2015-06-27 LAB — BASIC METABOLIC PANEL
Anion gap: 14 (ref 5–15)
BUN: 20 mg/dL (ref 6–20)
CALCIUM: 9.2 mg/dL (ref 8.9–10.3)
CO2: 24 mmol/L (ref 22–32)
CREATININE: 1.24 mg/dL — AB (ref 0.44–1.00)
Chloride: 98 mmol/L — ABNORMAL LOW (ref 101–111)
GFR calc Af Amer: 42 mL/min — ABNORMAL LOW (ref >60)
GFR calc non Af Amer: 37 mL/min — ABNORMAL LOW (ref >60)
GLUCOSE: 266 mg/dL — AB (ref 65–99)
Potassium: 3.7 mmol/L (ref 3.5–5.1)
Sodium: 136 mmol/L (ref 135–145)

## 2015-06-27 LAB — TROPONIN I
TROPONIN I: 0.1 ng/mL — AB (ref <0.031)
Troponin I: 0.98 ng/mL (ref <0.031)
Troponin I: 4.2 ng/mL (ref <0.031)

## 2015-06-27 MED ORDER — ALIGN PO CAPS
1.0000 | ORAL_CAPSULE | Freq: Every day | ORAL | Status: DC
  Administered 2015-06-28 – 2015-07-01 (×4): 1 via ORAL
  Filled 2015-06-27 (×5): qty 1

## 2015-06-27 MED ORDER — IPRATROPIUM-ALBUTEROL 0.5-2.5 (3) MG/3ML IN SOLN
3.0000 mL | RESPIRATORY_TRACT | Status: DC
  Administered 2015-06-27 (×3): 3 mL via RESPIRATORY_TRACT
  Filled 2015-06-27 (×3): qty 3

## 2015-06-27 MED ORDER — ZOLPIDEM TARTRATE 5 MG PO TABS
5.0000 mg | ORAL_TABLET | Freq: Once | ORAL | Status: AC
  Administered 2015-06-27: 5 mg via ORAL
  Filled 2015-06-27: qty 1

## 2015-06-27 MED ORDER — HEPARIN (PORCINE) IN NACL 100-0.45 UNIT/ML-% IJ SOLN
800.0000 [IU]/h | INTRAMUSCULAR | Status: DC
  Administered 2015-06-27: 900 [IU]/h via INTRAVENOUS
  Administered 2015-06-28: 800 [IU]/h via INTRAVENOUS
  Filled 2015-06-27 (×3): qty 250

## 2015-06-27 MED ORDER — NITROGLYCERIN 0.2 MG/HR TD PT24
0.2000 mg | MEDICATED_PATCH | Freq: Every day | TRANSDERMAL | Status: DC
  Administered 2015-06-27 – 2015-07-01 (×5): 0.2 mg via TRANSDERMAL
  Filled 2015-06-27 (×5): qty 1

## 2015-06-27 MED ORDER — IPRATROPIUM-ALBUTEROL 0.5-2.5 (3) MG/3ML IN SOLN
3.0000 mL | Freq: Three times a day (TID) | RESPIRATORY_TRACT | Status: DC
Start: 2015-06-28 — End: 2015-06-30
  Administered 2015-06-28 – 2015-06-29 (×6): 3 mL via RESPIRATORY_TRACT
  Filled 2015-06-27 (×6): qty 3

## 2015-06-27 MED ORDER — ASPIRIN 81 MG PO CHEW
81.0000 mg | CHEWABLE_TABLET | Freq: Every day | ORAL | Status: DC
  Administered 2015-06-27 – 2015-07-01 (×5): 81 mg via ORAL
  Filled 2015-06-27 (×5): qty 1

## 2015-06-27 MED ORDER — COLCHICINE 0.6 MG PO TABS
0.6000 mg | ORAL_TABLET | Freq: Once | ORAL | Status: AC
  Administered 2015-06-30: 0.6 mg via ORAL
  Filled 2015-06-27: qty 1

## 2015-06-27 MED ORDER — NON FORMULARY: 1.0000 | Freq: Every morning | Status: DC

## 2015-06-27 MED ORDER — WARFARIN SODIUM 2.5 MG PO TABS: 4.5000 mg | ORAL_TABLET | Freq: Once | ORAL | Status: DC

## 2015-06-27 NOTE — Evaluation (Signed)
Physical Therapy Evaluation Patient Details Name: [name] MRN: [id number] DOB: July 06, 1920 Today's Date: 06/27/2015   History of Present Illness  Pt is a 82 y/o F who presented w/ SOB and chest discomfort. Admitting dx: acute on chronic combined heart failure.  Pt's PMH includes CABG, legally blind, CKF stage III, DVT Bil UE, pacemaker.    Clinical Impression  Pt admitted with above diagnosis. Pt currently with functional limitations due to the deficits listed below (see PT Problem List). PTA [name] is dependent for ADLs and requires +1 assist to pivot to [facility], although spending most of her time in bed. Her daughter is prepared to take pt back home w/ continued HHPT once pt d/c.  Session limited due to pt's increased WOB lying supine in bed. Pt will benefit from skilled PT to increase their independence and safety with mobility to allow discharge to the venue listed below.      Follow Up Recommendations Home health PT;Supervision/Assistance - 24 hour    Equipment Recommendations  None recommended by PT    Recommendations for Other Services OT consult     Precautions / Restrictions Precautions Precautions: Fall Precaution Comments: legally blind Restrictions Weight Bearing Restrictions: No      Mobility  Bed Mobility               General bed mobility comments: deferred as pt w/ increased WOB and cannot finish a sentence w/o taking a break and catching her breath  Transfers                    Ambulation/Gait                Stairs            Wheelchair Mobility    Modified Rankin (Stroke Patients Only)       Balance                                             Pertinent Vitals/Pain Pain Assessment: Faces Faces Pain Scale: Hurts little more Pain Location: chest Pain Descriptors / Indicators: Aching Pain Intervention(s): Limited activity within patient's tolerance;Monitored during session    Home Living  Family/patient expects to be discharged to:: Private residence Living Arrangements: Children Available Help at Discharge: Family;Personal care attendant;Available 24 hours/day Type of Home: House Home Access: Ramped entrance     Home Layout: One level Home Equipment: Walker - 2 wheels;Bedside commode;Wheelchair - manual;Hospital bed Additional Comments: HHPT working on short distance ambulation (~15 ft) with some knee buckling and exercises.    Prior Function Level of Independence: Needs assistance   Gait / Transfers Assistance Needed: Daughter reports +1 assist to pivot transfer to [facility]    ADL's / Homemaking Assistance Needed: wears adult briefs with total assist for pericare and max assist for bathing and dressing        Hand Dominance   Dominant Hand: Right    Extremity/Trunk Assessment   Upper Extremity Assessment: Generalized weakness           Lower Extremity Assessment: Generalized weakness         Communication   Communication: HOH  Cognition Arousal/Alertness: Awake/alert Behavior During Therapy: WFL for tasks assessed/performed Overall Cognitive Status: Within Functional Limits for tasks assessed  General Comments General comments (skin integrity, edema, etc.): Daughter and nephew in room.  Nephew very concerned about pt's medical status and requests to speak to MD, RN notified and MD visited pt's room.  Session limited due to SOB when resting in bed.  SpO2 93% and HR 103.    Exercises General Exercises - Lower Extremity Ankle Circles/Pumps: AROM;Both;10 reps;Supine Quad Sets: Strengthening;Both;10 reps;Supine      Assessment/Plan    PT Assessment Patient needs continued PT services  PT Diagnosis Difficulty walking;Generalized weakness;Acute pain   PT Problem List Decreased strength;Decreased activity tolerance;Decreased balance;Decreased mobility;Decreased knowledge of use of DME;Decreased safety  awareness;Cardiopulmonary status limiting activity;Pain  PT Treatment Interventions DME instruction;Gait training;Functional mobility training;Therapeutic activities;Therapeutic exercise;Balance training;Patient/family education;Wheelchair mobility training   PT Goals (Current goals can be found in the Care Plan section) Acute Rehab PT Goals Patient Stated Goal: per family, to return home once medically stable PT Goal Formulation: With patient/family Time For Goal Achievement: 07/11/15 Potential to Achieve Goals: Fair    Frequency Min 3X/week   Barriers to discharge        Co-evaluation               End of Session Equipment Utilized During Treatment: Oxygen Activity Tolerance: Patient limited by fatigue;Treatment limited secondary to medical complications (Comment) (SOB) Patient left: in bed;with call bell/phone within reach;with bed alarm set;with family/visitor present Nurse Communication: Mobility status;Other (comment) (nephew requesting to speak w/ MD)    Functional Assessment Tool Used: Clincal Judgement Functional Limitation: Mobility: Walking and moving around Mobility: Walking and Moving Around Current Status [id number]): At least 60 percent but less than 80 percent impaired, limited or restricted Mobility: Walking and Moving Around Goal Status [phone]): At least 40 percent but less than 60 percent impaired, limited or restricted    Time: 0816-0835 PT Time Calculation (min) (ACUTE ONLY): 19 min   Charges:   PT Evaluation $PT Eval Moderate Complexity: 1 Procedure     PT G Codes:   PT G-Codes **NOT FOR INPATIENT CLASS** Functional Assessment Tool Used: Clincal Judgement Functional Limitation: Mobility: Walking and moving around Mobility: Walking and Moving Around Current Status [id number]): At least 60 percent but less than 80 percent impaired, limited or restricted Mobility: Walking and Moving Around Goal Status [phone]): At least 40 percent but less than 60 percent  impaired, limited or restricted    [name] PT, DPT  Pager: [phone] Phone: [phone] 06/27/2015, 10:13 AM

## 2015-06-27 NOTE — Progress Notes (Signed)
ANTICOAGULATION CONSULT NOTE - Initial Consult  Pharmacy Consult for Heparin (Coumadin to be held) Indication: atrial fibrillation  Allergies  Allergen Reactions  . Darvon Other (See Comments)    confusion  . Digoxin And Related Nausea Only  . Penicillins Other (See Comments)    Was told had allergy from childhood... Unknown reaction  . Percocet [Oxycodone-Acetaminophen] Other (See Comments)    confusion  . Percodan [Oxycodone-Aspirin] Other (See Comments)    confusion  . Vicodin [Hydrocodone-Acetaminophen] Other (See Comments)    confusion    Patient Measurements: Height: 5\' 4"  (162.6 cm) Weight: 198 lb 1.6 oz (89.858 kg) IBW/kg (Calculated) : 54.7  Vital Signs: Temp: 98.2 F (36.8 C) (06/11 1311) Temp Source: Oral (06/11 1311) BP: 90/53 mmHg (06/11 1311) Pulse Rate: 96 (06/11 1311)  Labs:  Recent Labs  06/24/15 1600  06/25/15 0906 06/25/15 1848 06/26/15 0255 06/27/15 0245 06/27/15 0602 06/27/15 1155  HGB 10.7*  --  10.5*  --   --  11.9*  --   --   HCT 34.3*  --  33.7*  --   --  37.5  --   --   PLT 110*  --  128*  --   --  138*  --   --   LABPROT  --   < > 23.0*  --  24.7* 21.4*  --   --   INR  --   < > 2.05*  --  2.25* 1.86*  --   --   CREATININE 1.24*  --  1.18*  --   --  1.24*  --   --   TROPONINI 0.12*  --  0.25* 0.18*  --   --  0.10* 0.98*  < > = values in this interval not displayed.  Estimated Creatinine Clearance: 31.4 mL/min (by C-G formula based on Cr of 1.24).   Medical History: Past Medical History  Diagnosis Date  . Hypertension   . Legally blind   . CKD (chronic kidney disease), stage III     a. iii - iv.  . Anemia   . Hypothyroid   . Gastric ulcer   . Hiatal hernia   . Dyspepsia   . UTI (lower urinary tract infection)   . H/O: GI bleed 12//13  . High cholesterol   . Chronic combined systolic and diastolic CHF (congestive heart failure) ([place])     a. 01/2014 Echo: EF @ least mod-sev reduced with HK of lat/apical, basal inf walls.  basalpost AK, Gr 1DD.  Marland Kitchen DVT of upper extremity (deep vein thrombosis) ([place]) 06/13/2012    BUE  . Seasonal allergies   . Allergy to perfume   . Type II diabetes mellitus ([place])   . History of blood transfusion 2013  . GERD (gastroesophageal reflux disease)   . Daily headache   . Arthritis   . PAF (paroxysmal atrial fibrillation) ([facility])     a. CHA2DS2VASc = 7-->coumadin.  . 2Nd degree atrioventricular block     a. s/p MDT ADDR01 DC PPM (ser #: [id number]).  . Coronary artery disease     a. 1994 s/p cabg;  b. 09/2013 MV: large, sev intensity, partially reversible inf, apical defect, prior inf/ap infarct w/ mild peri-infarct ischemia->Med Rx.  . Presence of permanent cardiac pacemaker      Assessment: 82 year old female with positive troponins.  Previously on Coumadin, now placed on hold Pharmacy to ask to begin heparin INR today = 1.86  Goal of Therapy:  Heparin level 0.3-0.7 units/ml  Monitor platelets by anticoagulation protocol: Yes   Plan:  No heparin bolus Heparin drip at 900 units / hr 8 hr heparin level Daily heparin level, CBC  Thank you Anette Guarneri, PharmD [phone]  06/27/2015,1:38 PM

## 2015-06-27 NOTE — Progress Notes (Signed)
  1:28 PM 06/27/2015  Called by RN. Troponin 0.98 (up from 0.10). No complaints. INR less than 2 today. Will hold Coumadin. Start IV Heparin per pharmacy. Richardson Dopp, PA-C   06/27/2015 1:28 PM

## 2015-06-27 NOTE — Progress Notes (Signed)
ANTICOAGULATION CONSULT NOTE - Follow Up Consult  Pharmacy Consult for Warfarin Indication: atrial fibrillation  Allergies  Allergen Reactions  . Darvon Other (See Comments)    confusion  . Digoxin And Related Nausea Only  . Penicillins Other (See Comments)    Was told had allergy from childhood... Unknown reaction  . Percocet [Oxycodone-Acetaminophen] Other (See Comments)    confusion  . Percodan [Oxycodone-Aspirin] Other (See Comments)    confusion  . Vicodin [Hydrocodone-Acetaminophen] Other (See Comments)    confusion    Patient Measurements: Height: 5\' 4"  (162.6 cm) Weight: 198 lb 1.6 oz (89.858 kg) IBW/kg (Calculated) : 54.7  Vital Signs: Temp: 98.2 F (36.8 C) (06/11 0633) Temp Source: Oral (06/11 [id number]) BP: 101/70 mmHg (06/11 [id number]) Pulse Rate: 97 (06/11 0633)  Labs:  Recent Labs  06/24/15 1600  06/25/15 0906 06/25/15 1848 06/26/15 0255 06/27/15 0245 06/27/15 0602  HGB 10.7*  --  10.5*  --   --  11.9*  --   HCT 34.3*  --  33.7*  --   --  37.5  --   PLT 110*  --  128*  --   --  138*  --   LABPROT  --   < > 23.0*  --  24.7* 21.4*  --   INR  --   < > 2.05*  --  2.25* 1.86*  --   CREATININE 1.24*  --  1.18*  --   --  1.24*  --   TROPONINI 0.12*  --  0.25* 0.18*  --   --  0.10*  < > = values in this interval not displayed.  Estimated Creatinine Clearance: 31.4 mL/min (by C-G formula based on Cr of 1.24).   Assessment: 82 yo F admitted 06/24/2015  With chest pain. Patient has a history of Afib and is on Coumadin PTA. Coumadin was held for a few days the week prior to admission for [facility] generator changeout on 6/6. Last dose PTA was 6/7.  INR 1.86 (subtherapeutic). Hgb 11.9, Plt 138. No s/sx of bleeding noted. ASA 81 mg added today, continue to monitor for bleeding.  PTA dose was 3 mg daily EXCEPT for 4.5 mg on MWF per family (anticoag clinic states 4.5 only on M/F).  Goal of Therapy:  INR 2-3   Plan:  Warfarin 4.5 mg x 1 tonight Monitor daily INR, CBC  and s/sx of bleeding  Dimitri Ped, PharmD. PGY-1 Pharmacy Resident Pager: [phone] 06/27/2015 8:25 AM

## 2015-06-27 NOTE — Progress Notes (Signed)
Trop 4.20 Dr. Wynonia Lawman made aware, ordered repeat trop tonight. Pt laying in bed no complaints of pain at this time.

## 2015-06-27 NOTE — Progress Notes (Signed)
[facility]                                                                              Patient Demographics  [name], is a 82 y.o. female, DOB - 1920/12/11, [id number]  Admit date - 06/24/2015   Admitting Physician Ripudeep Krystal Eaton, MD  Outpatient Primary MD for the patient is [name], MD  Outpatient specialists:   LOS -   days    Chief Complaint  Patient presents with  . Pacemaker Problem       Brief summary   Patient is a 82 year old female with history of complete heart block status post pacemaker with recent dual-chamber generator change out on 6/6, hypogonadism, hypertension, hyperlipidemia, diabetes mellitus, CAD, chronic kidney disease stage III presented to ED with episode of chest pain today. History was obtained from her multiple family members in her room. Per family members, they were concerned that her heart rate was in's 80s and prior to the pacemaker generator change it always had been in 89s. This morning, patient complained of chest discomfort first in the area of the pacemaker then radiating to the left chest and left arm. Patient was not able to qualify the chest pain. The episode was also associated with shortness of breath, patient was noticed to be clammy and diaphoretic by the family members. The whole episode lasted for 30-40 minutes and spontaneously resolved. First set of troponin mildly positive.   Assessment & Plan    Atypical Chest pain: Has a history of CAD, CABG in 1994. Myoview abnormal Aug 2015- medical Rx - Resolved. First set of cardiac enzymes mildly positive, patient refused labs after that. -  Serial troponins improving. - 2-D echocardiogram with EF 00-76%, grade 2 diastolic dysfunction. Prior echo 8/16 had shown EF of 30-35% with grade 1 diastolic dysfunction  Active Problems: Acute on chronic combined systolic and diastolic CHF: Still fluid overloaded and wheezing - cardiology following, placed on IV  Lasix - strict I's and O's and daily weights, still positive balance of 1.4L - Chest x-ray this morning showed hazy bilateral airspace opacification may reflect pneumonia or pulmonary edema. No fevers or leukocytosis or infectious symptoms to suggest pneumonia. - will defer to cardiology, may need to increase Lasix dose  Left knee pain, foot pain, leg pain with underlying history of neuropathy - Left knee x-ray showed small suprapatellar joint effusion and osteoarthritis - ESR, CRP elevated with uric acid 7.9, placed on colchicine and 3 doses of prednisone. No NSAIDs secondary to chronic kidney disease and CHF - Per patient, knee pain has significantly improved since yesterday   History of complete heart block with Pacemaker - Recent generator change out, cardiology consulted, will defer to cardiology if pacemaker needs to be interrogated   Hyperkalemia -BMET pending, patient had refused labs   Diabetes mellitus type 2, uncontrolled ([place]) - Placed on sliding scale insulin   Hypothyroidism - Continue outpatient dose of Synthroid   PAF (paroxysmal atrial fibrillation) ([place]) - Not on any rate controlling medications, has pacemaker - On warfarin per pharmacy    CKD (chronic kidney disease), stage III - Baseline creatinine 1-1.5  GERD - Continue PPI   Code Status: Full CODE STATUS  DVT Prophylaxis: Warfarin   Family Communication: Discussed in detail with the patient, all imaging results, lab results explained to the patient , patient's daughter and nephew in the room. Patient's nephew had multiple questions regarding the chemistry behind the patient's disease process, he feels that patient needs L-arginine and antioxidants to improve her CHF and many health plans in [place] are doing that. I explained him that I'm not aware of such guidelines and recommended him to discuss with cardiology if she needs such supplements for her heart failure management.   Disposition Plan:    Time Spent in minutes   25 minutes  Procedures:  2-D echo  Consultants:   Cardiology  Antimicrobials:      Medications  Scheduled Meds: . aspirin  81 mg Oral Daily  . atorvastatin  20 mg Oral q1800  . cholecalciferol  1,000 Units Oral Daily  . colchicine  0.6 mg Oral Daily  . donepezil  5 mg Oral QHS  . folic acid  1 mg Oral Daily  . furosemide  40 mg Intravenous BID  . gabapentin  100 mg Oral BID  . insulin aspart  0-5 Units Subcutaneous QHS  . insulin aspart  0-9 Units Subcutaneous TID WC  . ipratropium-albuterol  3 mL Nebulization Q4H  . levothyroxine  50 mcg Oral QAC breakfast  . loratadine  10 mg Oral Daily  . nitroGLYCERIN  0.2 mg Transdermal Daily  . pantoprazole  40 mg Oral Daily  . predniSONE  40 mg Oral Q breakfast  . ranolazine  500 mg Oral BID  . warfarin  4.5 mg Oral ONCE-1800  . Warfarin - Pharmacist Dosing Inpatient   Does not apply q1800   Continuous Infusions:  PRN Meds:.acetaminophen, albuterol, atropine, gi cocktail, meclizine, ondansetron (ZOFRAN) IV, traMADol   Antibiotics   Anti-infectives    None        Subjective:   Najmah Sandler was seen and examined today. No more episodes of chest pain. Left knee pain, left foot and leg pain is improving today. However the patient is more short of breath and wheezing today. Patient denies dizziness, abdominal pain, N/V/D/C, new weakness, numbess, tingling.   Objective:   Filed Vitals:   06/26/15 1239 06/26/15 1933 06/27/15 0633 06/27/15 0955  BP: 105/55 110/60 101/70   Pulse: 74 75 97   Temp: 98.1 F (36.7 C) 98.4 F (36.9 C) 98.2 F (36.8 C)   TempSrc: Oral Oral Oral   Resp: '18 16 22   '$ Height:      Weight:   89.858 kg (198 lb 1.6 oz)   SpO2: 97% 97% 90% 94%    Intake/Output Summary (Last 24 hours) at 06/27/15 1035 Last data filed at 06/26/15 1934  Gross per 24 hour  Intake    480 ml  Output      0 ml  Net    480 ml     Wt Readings from Last 3 Encounters:  06/27/15  89.858 kg (198 lb 1.6 oz)  06/22/15 84.369 kg (186 lb)  03/30/15 84.7 kg (186 lb 11.7 oz)     Exam  General: Alert and oriented x 3, NAD  HEENT:    Neck: Supple,+ JVD  Cardiovascular: S1 S2 auscultated, no rubs, murmurs or gallops. Regular rate and rhythm.  Respiratory: Diffuse wheezing bilaterally  Gastrointestinal: Soft, nontender, nondistended, + bowel sounds  Ext: no cyanosis clubbing or edema  Neuro: no new deficits  Skin: No rashes  Psych: Normal affect and demeanor, alert and oriented x3    Data Reviewed:  I have personally reviewed following labs and imaging studies  Micro Results Recent Results (from the past 240 hour(s))  Surgical pcr screen     Status: Abnormal   Collection Time: 06/22/15  1:56 PM  Result Value Ref Range Status   MRSA, PCR NEGATIVE NEGATIVE Final   Staphylococcus aureus POSITIVE (A) NEGATIVE Final    Comment:        The Xpert SA Assay (FDA approved for NASAL specimens in patients over 53 years of age), is one component of a comprehensive surveillance program.  Test performance has been validated by [facility] for patients greater than or equal to 76 year old. It is not intended to diagnose infection nor to guide or monitor treatment.     Radiology Reports Dg Chest Port 1 View  06/27/2015  CLINICAL DATA:  Acute onset of dyspnea.  Initial encounter. EXAM: PORTABLE CHEST 1 VIEW COMPARISON:  Chest radiograph performed 03/29/2015 FINDINGS: The lungs are well-aerated. Hazy bilateral airspace opacification may reflect pneumonia or pulmonary edema. Underlying vascular congestion is noted. No pleural effusion or pneumothorax is seen. The cardiomediastinal silhouette is mildly enlarged. The patient is status post median sternotomy, with evidence of prior CABG. A pacemaker is noted overlying the left chest wall, with leads ending overlying the right atrium and right ventricle. No acute osseous abnormalities are seen. IMPRESSION: 1. Hazy  bilateral airspace opacification may reflect pneumonia or pulmonary edema. 2. Underlying vascular congestion and mild cardiomegaly. Electronically Signed   By: Garald Balding M.D.   On: 06/27/2015 06:09   Dg Knee Complete 4 Views Left  06/26/2015  CLINICAL DATA:  Pt having left knee pain. Pt states pain is chronic, no injury. Pain is radiating down into her lower leg. EXAM: LEFT KNEE - COMPLETE 4+ VIEW COMPARISON:  None. FINDINGS: Vascular calcifications. Moderate 3 compartment osteoarthritis, with joint space narrowing and osteophyte formation. Small suprapatellar joint effusion. IMPRESSION: Osteoarthritis with small suprapatellar joint effusion. Electronically Signed   By: Abigail Miyamoto M.D.   On: 06/26/2015 11:42    Lab Data:  CBC:  Recent Labs Lab 06/24/15 1600 06/25/15 0906 06/27/15 0245  WBC 6.9 5.9 4.9  NEUTROABS 4.9  --   --   HGB 10.7* 10.5* 11.9*  HCT 34.3* 33.7* 37.5  MCV 91.7 89.9 89.1  PLT 110* 128* 003*   Basic Metabolic Panel:  Recent Labs Lab 06/24/15 1600 06/25/15 0906 06/27/15 0245  NA 135 139 136  K 5.6* 4.0 3.7  CL 103 102 98*  CO2 '26 29 24  '$ GLUCOSE 190* 141* 266*  BUN '18 15 20  '$ CREATININE 1.24* 1.18* 1.24*  CALCIUM 8.7* 9.1 9.2  MG 2.0  --   --    GFR: Estimated Creatinine Clearance: 31.4 mL/min (by C-G formula based on Cr of 1.24). Liver Function Tests: No results for input(s): AST, ALT, ALKPHOS, BILITOT, PROT, ALBUMIN in the last 168 hours. No results for input(s): LIPASE, AMYLASE in the last 168 hours. No results for input(s): AMMONIA in the last 168 hours. Coagulation Profile:  Recent Labs Lab 06/22/15 1352 06/24/15 2001 06/25/15 0906 06/26/15 0255 06/27/15 0245  INR 2.56* 1.99* 2.05* 2.25* 1.86*   Cardiac Enzymes:  Recent Labs Lab 06/24/15 1600 06/25/15 0906 06/25/15 1848 06/27/15 0602  TROPONINI 0.12* 0.25* 0.18* 0.10*   BNP (last 3 results) No results for input(s): PROBNP in the last 8760 hours. HbA1C:  Recent  Labs   06/24/15 2041  HGBA1C 7.3*   CBG:  Recent Labs Lab 06/26/15 0603 06/26/15 1204 06/26/15 1652 06/26/15 2151 06/27/15 0604  GLUCAP 135* 155* 127* 187* 287*   Lipid Profile: No results for input(s): CHOL, HDL, LDLCALC, TRIG, CHOLHDL, LDLDIRECT in the last 72 hours. Thyroid Function Tests: No results for input(s): TSH, T4TOTAL, FREET4, T3FREE, THYROIDAB in the last 72 hours. Anemia Panel: No results for input(s): VITAMINB12, FOLATE, FERRITIN, TIBC, IRON, RETICCTPCT in the last 72 hours. Urine analysis:    Component Value Date/Time   COLORURINE AMBER* 03/29/2015 2212   APPEARANCEUR TURBID* 03/29/2015 2212   LABSPEC 1.015 03/29/2015 2212   PHURINE 6.5 03/29/2015 2212   GLUCOSEU NEGATIVE 03/29/2015 2212   GLUCOSEU NEGATIVE 04/01/2013 1550   HGBUR MODERATE* 03/29/2015 2212   BILIRUBINUR NEGATIVE 03/29/2015 2212   KETONESUR NEGATIVE 03/29/2015 2212   PROTEINUR NEGATIVE 03/29/2015 2212   UROBILINOGEN 1.0 08/16/2014 1521   NITRITE NEGATIVE 03/29/2015 2212   LEUKOCYTESUR LARGE* 03/29/2015 2212     RAI,RIPUDEEP M.D. [facility] 06/27/2015, 10:35 AM  Pager: [phone] Between 7am to 7pm - call Pager - [phone]  After 7pm go to [email or web address] - password [email or web address]  Call night coverage person covering after 7pm

## 2015-06-27 NOTE — Progress Notes (Addendum)
Critical Value Trop 0.98 [name] made aware.Orders placed at this time. No chest pain noted.

## 2015-06-27 NOTE — Progress Notes (Signed)
Patient's daughter reports that the patient is wheezing and spitting. Upon assessment the patient does have audible expiratory wheezes bilateral upper lobes. She is also belching and spitting clear liquid into a cup. The patient is not in distress, however she was offered and given a breathing treatment for the wheezing and gi cocktail for the indigestion. Patient verbalized some relief. 30 minutes later patient's daughter now says the patient is "short winded". Attending to be notified for notification of further orders.

## 2015-06-27 NOTE — Progress Notes (Signed)
Patient ID: [name], female   DOB: Oct 17, 1920, 82 y.o.   MRN: [id number]    Primary cardiologist: Dr. Sanda Klein  Seen for followup: Acute on chronic combined heart failure  Subjective:    Patient seems to have a lot of tangential questions ? Arginine and supplements for CHF Worried about UTI's   Objective:   Temp:  [98.1 F (36.7 C)-98.4 F (36.9 C)] 98.2 F (36.8 C) (06/11 [id number]) Pulse Rate:  [74-97] 97 (06/11 0633) Resp:  [16-22] 22 (06/11 0633) BP: (101-110)/(55-70) 101/70 mmHg (06/11 0633) SpO2:  [90 %-97 %] 94 % (06/11 0955) Weight:  [89.858 kg (198 lb 1.6 oz)] 89.858 kg (198 lb 1.6 oz) (06/11 0633) Last BM Date: 06/25/15  Filed Weights   06/25/15 [id number] 06/26/15 0403 06/27/15 [id number]  Weight: 85.73 kg (189 lb) 88.27 kg (194 lb 9.6 oz) 89.858 kg (198 lb 1.6 oz)    Intake/Output Summary (Last 24 hours) at 06/27/15 1207 Last data filed at 06/26/15 1934  Gross per 24 hour  Intake    480 ml  Output      0 ml  Net    480 ml    Telemetry: Ventricular pacing.  Exam:  General: Elderly woman, appears comfortable at rest.  Thorax: Well-healing left upper chest device pocket site.  Lungs: Clear, no wheezing.  Cardiac: RRR without gallop. Soft systolic murmur.  Extremities: Mild leg edema.  Lab Results:  Basic Metabolic Panel:  Recent Labs Lab 06/24/15 1600 06/25/15 0906 06/27/15 0245  NA 135 139 136  K 5.6* 4.0 3.7  CL 103 102 98*  CO2 26 29 24   GLUCOSE 190* 141* 266*  BUN 18 15 20   CREATININE 1.24* 1.18* 1.24*  CALCIUM 8.7* 9.1 9.2  MG 2.0  --   --     CBC:  Recent Labs Lab 06/24/15 1600 06/25/15 0906 06/27/15 0245  WBC 6.9 5.9 4.9  HGB 10.7* 10.5* 11.9*  HCT 34.3* 33.7* 37.5  MCV 91.7 89.9 89.1  PLT 110* 128* 138*    Cardiac Enzymes:  Recent Labs Lab 06/25/15 0906 06/25/15 1848 06/27/15 0602  TROPONINI 0.25* 0.18* 0.10*    Coagulation:  Recent Labs Lab 06/25/15 0906 06/26/15 0255 06/27/15 0245  INR 2.05* 2.25*  1.86*   Echocardiogram 06/25/2015: Study Conclusions  - Left ventricle: The cavity size was normal. Wall thickness was  increased in a pattern of moderate LVH. Systolic function was  mildly reduced. The estimated ejection fraction was in the range  of 45% to 50%. Features are consistent with a pseudonormal left  ventricular filling pattern, with concomitant abnormal relaxation  and increased filling pressure (grade 2 diastolic dysfunction). - Aortic valve: Mildly calcified annulus. Mildly thickened, mildly  calcified leaflets.   Medications:   Scheduled Medications: . aspirin  81 mg Oral Daily  . atorvastatin  20 mg Oral q1800  . cholecalciferol  1,000 Units Oral Daily  . colchicine  0.6 mg Oral Daily  . donepezil  5 mg Oral QHS  . folic acid  1 mg Oral Daily  . furosemide  40 mg Intravenous BID  . gabapentin  100 mg Oral BID  . insulin aspart  0-5 Units Subcutaneous QHS  . insulin aspart  0-9 Units Subcutaneous TID WC  . ipratropium-albuterol  3 mL Nebulization Q4H  . levothyroxine  50 mcg Oral QAC breakfast  . loratadine  10 mg Oral Daily  . nitroGLYCERIN  0.2 mg Transdermal Daily  . pantoprazole  40 mg Oral Daily  .  predniSONE  40 mg Oral Q breakfast  . ranolazine  500 mg Oral BID  . warfarin  4.5 mg Oral ONCE-1800  . Warfarin - Pharmacist Dosing Inpatient   Does not apply q1800    PRN Medications: acetaminophen, albuterol, atropine, gi cocktail, meclizine, ondansetron (ZOFRAN) IV, traMADol   Assessment:   1. Acute on chronic combined heart failure with LVEF 45-50% and grade 2 diastolic dysfunction.  IV Lasix.  She does not report any breathlessness.  2. Recent atypical chest discomfort. Minor increase in troponin I is not suggestive of ACS, more than likely a component of demand ischemia in the setting of cardiomyopathy and known underlying ischemic heart disease.  3. Multivessel CAD status post CABG. Noninvasive ischemic testing from 2015 most suggestive of  previous inferior apical infarct scar with peri-infarct ischemia. This has been managed medically.  4. History of complete heart block status post Medtronic pacemaker placement. Recent generator change by Dr. Sallyanne Kuster. Pocket site looks to be healing well.  5. Paroxysmal atrial fibrillation, on Coumadin.   Plan/Discussion:    Patient continues on aspirin, Lipitor, [name], and Coumadin. Lasix is at 40 mg IV twice daily for now. Convert to PO diuretics In am   Jenkins Rouge

## 2015-06-28 DIAGNOSIS — I25111 Atherosclerotic heart disease of native coronary artery with angina pectoris with documented spasm: Secondary | ICD-10-CM

## 2015-06-28 LAB — CBC
HCT: 33.1 % — ABNORMAL LOW (ref 36.0–46.0)
Hemoglobin: 10.5 g/dL — ABNORMAL LOW (ref 12.0–15.0)
MCH: 28.5 pg (ref 26.0–34.0)
MCHC: 31.7 g/dL (ref 30.0–36.0)
MCV: 89.7 fL (ref 78.0–100.0)
PLATELETS: 138 10*3/uL — AB (ref 150–400)
RBC: 3.69 MIL/uL — ABNORMAL LOW (ref 3.87–5.11)
RDW: 14.4 % (ref 11.5–15.5)
WBC: 7.5 10*3/uL (ref 4.0–10.5)

## 2015-06-28 LAB — GLUCOSE, CAPILLARY
GLUCOSE-CAPILLARY: 233 mg/dL — AB (ref 65–99)
GLUCOSE-CAPILLARY: 210 mg/dL — AB (ref 65–99)
GLUCOSE-CAPILLARY: 249 mg/dL — AB (ref 65–99)
Glucose-Capillary: 226 mg/dL — ABNORMAL HIGH (ref 65–99)

## 2015-06-28 LAB — BASIC METABOLIC PANEL
Anion gap: 11 (ref 5–15)
BUN: 33 mg/dL — AB (ref 6–20)
CALCIUM: 8.8 mg/dL — AB (ref 8.9–10.3)
CO2: 27 mmol/L (ref 22–32)
CREATININE: 1.65 mg/dL — AB (ref 0.44–1.00)
Chloride: 98 mmol/L — ABNORMAL LOW (ref 101–111)
GFR calc Af Amer: 30 mL/min — ABNORMAL LOW (ref >60)
GFR, EST NON AFRICAN AMERICAN: 26 mL/min — AB (ref >60)
GLUCOSE: 248 mg/dL — AB (ref 65–99)
POTASSIUM: 4.1 mmol/L (ref 3.5–5.1)
SODIUM: 136 mmol/L (ref 135–145)

## 2015-06-28 LAB — HEPARIN LEVEL (UNFRACTIONATED)
HEPARIN UNFRACTIONATED: 0.34 [IU]/mL (ref 0.30–0.70)
Heparin Unfractionated: 0.67 IU/mL (ref 0.30–0.70)

## 2015-06-28 LAB — PROTIME-INR
INR: 2.01 — AB (ref 0.00–1.49)
PROTHROMBIN TIME: 22.7 s — AB (ref 11.6–15.2)

## 2015-06-28 LAB — TROPONIN I: Troponin I: 6.31 ng/mL (ref <0.031)

## 2015-06-28 MED ORDER — INSULIN ASPART 100 UNIT/ML ~~LOC~~ SOLN
4.0000 [IU] | Freq: Three times a day (TID) | SUBCUTANEOUS | Status: DC
  Administered 2015-06-28: 4 [IU] via SUBCUTANEOUS

## 2015-06-28 NOTE — Progress Notes (Signed)
CRITICAL VALUE ALERT  Critical value received:  Troponin 6.31  Date of notification:  06/28/15  Time of notification:  115 (no call received. New lab result flag seen.)  Critical value read back:No.  Nurse who received alert:  Junius Argyle, RN  MD notified (1st page):  Dr. Wynonia Lawman  Time of first page:  73  MD notified (2nd page):  Time of second page:  Responding MD:  Dr. Wynonia Lawman  Time MD responded:  116  No new orders given at this time. Continue Heparin, will continue to monitor. Jimmie Molly, RN

## 2015-06-28 NOTE — Progress Notes (Addendum)
Patient Name: [name] Date of Encounter: 06/28/2015  Principal Problem:   Chest pain Active Problems:   Coronary artery disease, CABG in 1994. Myoview abnormal Aug 2015- medical Rx   Pacemaker   Hyperkalemia   Diabetes mellitus type 2, uncontrolled (HCC)   Hypothyroidism   PAF (paroxysmal atrial fibrillation) (HCC)   Complete heart block (HCC)   CKD (chronic kidney disease), stage III   Elevated troponin I level   Acute on chronic systolic and diastolic heart failure, NYHA class 1 ([place])   Primary Cardiologist: Dr. Sallyanne Kuster Patient Profile: [name] is a 82F with chronic systolic and diastolic heart failure LVEF 45-50%, medically managed CAD, hypertension, and hyperlipidemia here with chest pain.Intitially her pain was reproducible with palpation especially near PPM site as she recently had a device change out. Troponin trend 0.12>>0.18>>0.98>>4.2>>6.31.    SUBJECTIVE: Feels well, denies chest pain and SOB. Family with multiple concerns.   OBJECTIVE Filed Vitals:   06/27/15 1229 06/27/15 1311 06/27/15 1939 06/28/15 0404  BP:  90/53 107/63 92/54  Pulse:  96 85 87  Temp:  98.2 F (36.8 C) 98.2 F (36.8 C) 97.6 F (36.4 C)  TempSrc:  Oral Oral Oral  Resp:  20 18 21   Height:      Weight:    191 lb (86.637 kg)  SpO2: 97% 95% 99% 100%    Intake/Output Summary (Last 24 hours) at 06/28/15 0937 Last data filed at 06/28/15 0422  Gross per 24 hour  Intake    240 ml  Output    350 ml  Net   -110 ml   Filed Weights   06/26/15 0403 06/27/15 0633 06/28/15 0404  Weight: 194 lb 9.6 oz (88.27 kg) 198 lb 1.6 oz (89.858 kg) 191 lb (86.637 kg)    PHYSICAL EXAM General: Well developed, well nourished, female in no acute distress. Head: Normocephalic, atraumatic.  Neck: Supple without bruits, no JVD. Lungs:  Resp regular and unlabored, CTA. Heart: RRR, S1, S2, no S3, S4, or murmur; no rub. Abdomen: Soft, non-tender, non-distended, BS + x 4.  Extremities: No  clubbing, cyanosis, +1 BLE edema.  Neuro: Alert and oriented X 3. Moves all extremities spontaneously. Psych: Normal affect.  LABS: CBC: Recent Labs  06/27/15 0245 06/28/15 0305  WBC 4.9 7.5  HGB 11.9* 10.5*  HCT 37.5 33.1*  MCV 89.1 89.7  PLT 138* 138*   INR: Recent Labs  06/28/15 0305  INR [id number]*   Basic Metabolic Panel: Recent Labs  06/27/15 0245 06/28/15 0305  NA 136 136  K 3.7 4.1  CL 98* 98*  CO2 24 27  GLUCOSE 266* 248*  BUN 20 33*  CREATININE 1.24* 1.65*  CALCIUM 9.2 8.8*   Cardiac Enzymes: Recent Labs  06/27/15 1155 06/27/15 1714 06/28/15 0025  TROPONINI 0.98* 4.20* 6.31*   BNP:  BRAIN NATRIURETIC PEPTIDE  Date/Time Value Ref Range Status  06/08/2015 11:19 AM 452.0* <100 pg/mL Final    Comment:      BNP levels increase with age in the general population with the highest values seen in individuals greater than 80 years of age. Reference: Joellyn Rued Cardiol 2002; [id number].      B NATRIURETIC PEPTIDE  Date/Time Value Ref Range Status  06/26/2015 02:55 AM 252.8* 0.0 - 100.0 pg/mL Final  06/24/2015 04:00 PM 372.7* 0.0 - 100.0 pg/mL Final    Current facility-administered medications:  .  acetaminophen (TYLENOL) tablet 500 mg, 500 mg, Oral, Q6H PRN, Ripudeep K Rai,  MD .  albuterol (PROVENTIL) (2.5 MG/3ML) 0.083% nebulizer solution 3 mL, 3 mL, Inhalation, Q6H PRN, Ripudeep K Rai, MD, 3 mL at 06/27/15 0412 .  aspirin chewable tablet 81 mg, 81 mg, Oral, Daily, [employer], PA-C, 81 mg at 06/27/15 1050 .  atorvastatin (LIPITOR) tablet 20 mg, 20 mg, Oral, q1800, Skeet Latch, MD, 20 mg at 06/27/15 1800 .  atropine 1 % ophthalmic solution 1 drop, 1 drop, Both Eyes, Daily PRN, Ripudeep K Rai, MD .  bifidobacterium infantis (ALIGN) capsule 1 capsule, 1 capsule, Oral, Daily, Ripudeep K Rai, MD .  cholecalciferol (VITAMIN D) tablet 1,000 Units, 1,000 Units, Oral, Daily, Ripudeep K Rai, MD, 1,000 Units at 06/27/15 1050 .  [START ON 06/30/2015]  colchicine tablet 0.6 mg, 0.6 mg, Oral, Once, Ripudeep K Rai, MD .  donepezil (ARICEPT) tablet 5 mg, 5 mg, Oral, QHS, Ripudeep K Rai, MD, 5 mg at 06/27/15 2229 .  folic acid (FOLVITE) tablet 1 mg, 1 mg, Oral, Daily, Ripudeep K Rai, MD, 1 mg at 06/27/15 1050 .  furosemide (LASIX) injection 40 mg, 40 mg, Intravenous, BID, Skeet Latch, MD, 40 mg at 06/28/15 0848 .  gabapentin (NEURONTIN) capsule 100 mg, 100 mg, Oral, BID, Ripudeep K Rai, MD, 100 mg at 06/27/15 2229 .  gi cocktail (Maalox,Lidocaine,Donnatal), 30 mL, Oral, QID PRN, Ripudeep K Rai, MD, 30 mL at 06/27/15 0412 .  heparin ADULT infusion 100 units/mL (25000 units/226mL sodium chloride 0.45%), 900 Units/hr, Intravenous, Continuous, Ripudeep K Rai, MD, Last Rate: 9 mL/hr at 06/27/15 1345, 900 Units/hr at 06/27/15 1345 .  insulin aspart (novoLOG) injection 0-5 Units, 0-5 Units, Subcutaneous, QHS, Ripudeep K Rai, MD, 2 Units at 06/27/15 2230 .  insulin aspart (novoLOG) injection 0-9 Units, 0-9 Units, Subcutaneous, TID WC, Ripudeep Krystal Eaton, MD, 2 Units at 06/28/15 0610 .  ipratropium-albuterol (DUONEB) 0.5-2.5 (3) MG/3ML nebulizer solution 3 mL, 3 mL, Nebulization, TID, Ripudeep K Rai, MD, 3 mL at 06/28/15 0750 .  levothyroxine (SYNTHROID, LEVOTHROID) tablet 50 mcg, 50 mcg, Oral, QAC breakfast, Ripudeep K Rai, MD, 50 mcg at 06/28/15 0610 .  loratadine (CLARITIN) tablet 10 mg, 10 mg, Oral, Daily, Ripudeep K Rai, MD, 10 mg at 06/27/15 1050 .  meclizine (ANTIVERT) tablet 25 mg, 25 mg, Oral, TID PRN, Ripudeep K Rai, MD .  nitroGLYCERIN (NITRODUR - Dosed in mg/24 hr) patch 0.2 mg, 0.2 mg, Transdermal, Daily, Doreene Burke Kilroy, PA-C, 0.2 mg at 06/27/15 1050 .  ondansetron (ZOFRAN) injection 4 mg, 4 mg, Intravenous, Q6H PRN, Ripudeep K Rai, MD .  pantoprazole (PROTONIX) EC tablet 40 mg, 40 mg, Oral, Daily, Ripudeep K Rai, MD, 40 mg at 06/27/15 1050 .  predniSONE (DELTASONE) tablet 40 mg, 40 mg, Oral, Q breakfast, Ripudeep K Rai, MD, 40 mg at 06/28/15  0610 .  ranolazine ([name]) 12 hr tablet 500 mg, 500 mg, Oral, BID, Ripudeep K Rai, MD, 500 mg at 06/27/15 2227 .  traMADol (ULTRAM) tablet 50 mg, 50 mg, Oral, Q8H PRN, Ripudeep K Rai, MD, 50 mg at 06/26/15 2205 . heparin 900 Units/hr (06/27/15 1345)    TELE:   V paced     ECG: V paced   Radiology/Studies: Dg Chest Port 1 View  06/27/2015  CLINICAL DATA:  Acute onset of dyspnea.  Initial encounter. EXAM: PORTABLE CHEST 1 VIEW COMPARISON:  Chest radiograph performed 03/29/2015 FINDINGS: The lungs are well-aerated. Hazy bilateral airspace opacification may reflect pneumonia or pulmonary edema. Underlying vascular congestion is noted. No pleural effusion or pneumothorax  is seen. The cardiomediastinal silhouette is mildly enlarged. The patient is status post median sternotomy, with evidence of prior CABG. A pacemaker is noted overlying the left chest wall, with leads ending overlying the right atrium and right ventricle. No acute osseous abnormalities are seen. IMPRESSION: 1. Hazy bilateral airspace opacification may reflect pneumonia or pulmonary edema. 2. Underlying vascular congestion and mild cardiomegaly. Electronically Signed   By: Garald Balding M.D.   On: 06/27/2015 06:09   Dg Knee Complete 4 Views Left  06/26/2015  CLINICAL DATA:  Pt having left knee pain. Pt states pain is chronic, no injury. Pain is radiating down into her lower leg. EXAM: LEFT KNEE - COMPLETE 4+ VIEW COMPARISON:  None. FINDINGS: Vascular calcifications. Moderate 3 compartment osteoarthritis, with joint space narrowing and osteophyte formation. Small suprapatellar joint effusion. IMPRESSION: Osteoarthritis with small suprapatellar joint effusion. Electronically Signed   By: Abigail Miyamoto M.D.   On: 06/26/2015 11:42     Current Medications:  . aspirin  81 mg Oral Daily  . atorvastatin  20 mg Oral q1800  . bifidobacterium infantis  1 capsule Oral Daily  . cholecalciferol  1,000 Units Oral Daily  . [START ON 06/30/2015]  colchicine  0.6 mg Oral Once  . donepezil  5 mg Oral QHS  . folic acid  1 mg Oral Daily  . furosemide  40 mg Intravenous BID  . gabapentin  100 mg Oral BID  . insulin aspart  0-5 Units Subcutaneous QHS  . insulin aspart  0-9 Units Subcutaneous TID WC  . ipratropium-albuterol  3 mL Nebulization TID  . levothyroxine  50 mcg Oral QAC breakfast  . loratadine  10 mg Oral Daily  . nitroGLYCERIN  0.2 mg Transdermal Daily  . pantoprazole  40 mg Oral Daily  . predniSONE  40 mg Oral Q breakfast  . ranolazine  500 mg Oral BID   . heparin 900 Units/hr (06/27/15 1345)    ASSESSMENT AND PLAN: Principal Problem:   Chest pain Active Problems:   Coronary artery disease, CABG in 1994. Myoview abnormal Aug 2015- medical Rx   Pacemaker   Hyperkalemia   Diabetes mellitus type 2, uncontrolled (HCC)   Hypothyroidism   PAF (paroxysmal atrial fibrillation) (HCC)   Complete heart block (HCC)   CKD (chronic kidney disease), stage III   Elevated troponin I level   Acute on chronic systolic and diastolic heart failure, NYHA class 1 ([place])  1. Elevated troponin: Patient's troponin elevated this am. EKG is consistent with paced rhythm. Her troponin elevation is consistent with possible infarct, however patient is not a candidate for a heart catheterization. It is best that she be managed medically given her CKD. She is on heparin gtt will continue for 48 hours. Will continue ASA. She is on [name] and transdermal NTG, continue same. She is chest pain free.    2. Acute on chronic combined diastolic and systolic CHF: LVEF [id number], grade 2 diastolic dysfunction. Continue IV diuresis and reassess switching to po tomorrow. She does appear somewhat volume overloaded on exam. Chest x ray yesterday shows some mild underlying vascular congestion. Her weight is down 7 pounds from yesterday, question accuracy. BP too low for Coreg. No ACE or ARB in setting of CKD.   3. History of 2nd degree AVB/complete heart block status  post Medtronic pacemaker placement: had generator change out on 06/22/15. Site looks stable, no sign of infection.   4. DM: management per primary team.    Signed, Arbutus Leas ,  NP 9:37 AM 06/28/2015 Pager [phone]  Attending Note:   The patient was seen and examined.  Agree with assessment and plan as noted above.  Changes made to the above note as needed.  Patient seen and independently examined with Jettie Booze, NP.   We discussed all aspects of the encounter. I agree with the assessment and plan as stated above.  82 yo with hx of CAD, recent gen change. No presents with worsening dyspnea.  Has EF 45-50%.   Grade 2 diastolic dysfunction   Is getting lasix but I/O suggest no diuresis yet.    Given her age of 51, I would not pursue any further work up for her CAD She is not a candidate for cath .   I have told the family that she had a small MI and that we would treat her medically . She has had a GI bleed in the past and also has CKD - no a good cath candidate at all    I have spent a total of 40 minutes with patient reviewing hospital  notes , telemetry, EKGs, labs and examining patient as well as establishing an assessment and plan that was discussed with the patient. > 50% of time was spent in direct patient care.    Thayer Headings, Brooke Bonito., MD, [facility] 06/28/2015, 12:05 PM [address],  [place] Pager [phone]

## 2015-06-28 NOTE — Progress Notes (Signed)
Inpatient Diabetes Program Recommendations  AACE/ADA: New Consensus Statement on Inpatient Glycemic Control (2015)  Target Ranges:  Prepandial:   less than 140 mg/dL      Peak postprandial:   less than 180 mg/dL (1-2 hours)      Critically ill patients:  140 - 180 mg/dL  Results for DOMINGO, CARP (MRN [id number]) as of 06/28/2015 11:09  Ref. Range 06/27/2015 06:04 06/27/2015 11:47 06/27/2015 16:25 06/27/2015 20:34 06/28/2015 06:01  Glucose-Capillary Latest Ref Range: 65-99 mg/dL 287 (H) 262 (H) 267 (H) 248 (H) 233 (H)  Results for LILYANI, LABRA (MRN [id number]) as of 06/28/2015 11:09  Ref. Range 06/24/2015 20:41  Hemoglobin A1C Latest Ref Range: 4.8-5.6 % 7.3 (H)   Review of Glycemic Control  Diabetes history: DM2 Outpatient Diabetes medications: Glipizide 2.5 mg daily (if CBG > 300 mg/dl) Current orders for Inpatient glycemic control: Novolog 0-9 units TID with meals, Novolog 0-5 units QHS  Inpatient Diabetes Program Recommendations: Insulin - Meal Coverage: While inpatient and ordered steroids, please consider ordering Novolog 4 units TID with meals for meal coverage if patient eats at least 50% of meals.  Thanks, Barnie Alderman, RN, MSN, CDE Diabetes Coordinator Inpatient Diabetes Program [phone] (Team Pager from [place] to [place]) [phone] ([facility] office) [phone] [facility] office) [phone] [facility] office)

## 2015-06-28 NOTE — Progress Notes (Signed)
ANTICOAGULATION CONSULT NOTE - Follow Up Consult  Pharmacy Consult for Heparin (warfarin on hold) Indication: atrial fibrillation  Allergies  Allergen Reactions  . Darvon Other (See Comments)    confusion  . Digoxin And Related Nausea Only  . Penicillins Other (See Comments)    Was told had allergy from childhood... Unknown reaction  . Percocet [Oxycodone-Acetaminophen] Other (See Comments)    confusion  . Percodan [Oxycodone-Aspirin] Other (See Comments)    confusion  . Vicodin [Hydrocodone-Acetaminophen] Other (See Comments)    confusion    Patient Measurements: Height: 5\' 4"  (162.6 cm) Weight: 198 lb 1.6 oz (89.858 kg) IBW/kg (Calculated) : 54.7  Vital Signs: Temp: 98.2 F (36.8 C) (06/11 1939) Temp Source: Oral (06/11 1939) BP: 107/63 mmHg (06/11 1939) Pulse Rate: 85 (06/11 1939)  Labs:  Recent Labs  06/25/15 0906  06/26/15 0255 06/27/15 0245 06/27/15 0602 06/27/15 1155 06/27/15 1714 06/27/15 2312  HGB 10.5*  --   --  11.9*  --   --   --   --   HCT 33.7*  --   --  37.5  --   --   --   --   PLT 128*  --   --  138*  --   --   --   --   LABPROT 23.0*  --  24.7* 21.4*  --   --   --   --   INR 2.05*  --  2.25* 1.86*  --   --   --   --   HEPARINUNFRC  --   --   --   --   --   --   --  0.34  CREATININE 1.18*  --   --  1.24*  --   --   --   --   TROPONINI 0.25*  < >  --   --  0.10* 0.98* 4.20*  --   < > = values in this interval not displayed.  Estimated Creatinine Clearance: 30.8 mL/min (by C-G formula based on Cr of 1.24).   Assessment: Rising troponin, holding warfarin and using heparin, initial heparin level is therapeutic  Goal of Therapy:  Heparin level 0.3-0.7 units/ml Monitor platelets by anticoagulation protocol: Yes   Plan:  -Cont heparin at 900 units/hr -0800 HL  Narda Bonds 06/28/2015,12:32 AM

## 2015-06-28 NOTE — Progress Notes (Signed)
ANTICOAGULATION CONSULT NOTE - Follow Up Consult  Pharmacy Consult for Heparin (warfarin on hold) Indication: atrial fibrillation  Allergies  Allergen Reactions  . Darvon Other (See Comments)    confusion  . Digoxin And Related Nausea Only  . Penicillins Other (See Comments)    Was told had allergy from childhood... Unknown reaction  . Percocet [Oxycodone-Acetaminophen] Other (See Comments)    confusion  . Percodan [Oxycodone-Aspirin] Other (See Comments)    confusion  . Vicodin [Hydrocodone-Acetaminophen] Other (See Comments)    confusion    Patient Measurements: Height: 5\' 4"  (162.6 cm) Weight: 191 lb (86.637 kg) IBW/kg (Calculated) : 54.7  Vital Signs: Temp: 97.6 F (36.4 C) (06/12 0404) Temp Source: Oral (06/12 0404) BP: 92/54 mmHg (06/12 0404) Pulse Rate: 87 (06/12 0404)  Labs:  Recent Labs  06/26/15 0255 06/27/15 0245  06/27/15 1155 06/27/15 1714 06/27/15 2312 06/28/15 0025 06/28/15 0305 06/28/15 0732  HGB  --  11.9*  --   --   --   --   --  10.5*  --   HCT  --  37.5  --   --   --   --   --  33.1*  --   PLT  --  138*  --   --   --   --   --  138*  --   LABPROT 24.7* 21.4*  --   --   --   --   --  22.7*  --   INR 2.25* 1.86*  --   --   --   --   --  2.01*  --   HEPARINUNFRC  --   --   --   --   --  0.34  --   --  0.67  CREATININE  --  1.24*  --   --   --   --   --  1.65*  --   TROPONINI  --   --   < > 0.98* 4.20*  --  6.31*  --   --   < > = values in this interval not displayed.  Estimated Creatinine Clearance: 22.7 mL/min (by C-G formula based on Cr of 1.65).   Assessment: 82 yo F on Coumadin PTA for hx afib.  Pt transitioned to heparin 6/11 with elevated troponin and suspected MI.  Per cards, plan for med mgmt, heparin x 48 hours.  Heparin level is rising 0.34 > 0.67 on heparin at 900 units/hr.  Will empirically reduce rate.  Hgb 11.9> 10.5.  No bleeding noted.  Goal of Therapy:  Heparin level 0.3-0.7 units/ml Monitor platelets by anticoagulation  protocol: Yes   Plan:  Empirically reduce heparin 800 units/hr (anticipate stop 6/13 ~ noon) Daily heparin level, CBC, INR Restart Coumadin 6/13?  [employer], Pharm.D., BCPS Clinical Pharmacist Pager [phone] 06/28/2015 1:22 PM

## 2015-06-28 NOTE — Progress Notes (Signed)
[facility]                                                                              Patient Demographics  [name], is a 82 y.o. female, DOB - May 28, 1920, KLK:[id number]  Admit date - 06/24/2015   Admitting Physician Jilliana Burkes Krystal Eaton, MD  Outpatient Primary MD for the patient is [name], MD  Outpatient specialists:   LOS -   days    Chief Complaint  Patient presents with  . Pacemaker Problem       Brief summary   Patient is a 82 year old female with history of complete heart block status post pacemaker with recent dual-chamber generator change out on 6/6, hypogonadism, hypertension, hyperlipidemia, diabetes mellitus, CAD, chronic kidney disease stage III presented to ED with episode of chest pain today. History was obtained from her multiple family members in her room. Per family members, they were concerned that her heart rate was in's 80s and prior to the pacemaker generator change it always had been in 73s. This morning, patient complained of chest discomfort first in the area of the pacemaker then radiating to the left chest and left arm. Patient was not able to qualify the chest pain. The episode was also associated with shortness of breath, patient was noticed to be clammy and diaphoretic by the family members. The whole episode lasted for 30-40 minutes and spontaneously resolved. First set of troponin mildly positive.   Assessment & Plan    Atypical Chest pain/NSTEMI: Has a history of CAD, CABG in 1994. Myoview abnormal Aug 2015- medical Rx -  2-D echocardiogram with EF 97-94%, grade 2 diastolic dysfunction. Prior echo 8/16 had shown EF of 30-35% with grade 1 diastolic dysfunction - Serial cardiac enzymes showed troponin is trending up, elevated at 6.31 - Audiology following, was started on heparin drip, currently recommending medical management given age, multiple medical problems, CKD, cath is not recommended  Active Problems: Acute on  chronic combined systolic and diastolic CHF:  - cardiology following, placed on IV Lasix - strict I's and O's and daily weights, still positive balance of 1.6l - Chest x-ray this morning showed hazy bilateral airspace opacification may reflect pneumonia or pulmonary edema. No fevers or leukocytosis or infectious symptoms to suggest pneumonia. -Creatinine trending up due to diuresis   Left knee pain, foot pain, leg pain with underlying history of neuropathy - Left knee x-ray showed small suprapatellar joint effusion and osteoarthritis - ESR, CRP elevated with uric acid 7.9, placed on colchicine and 3 doses of prednisone. No NSAIDs secondary to chronic kidney disease and CHF - Knee pain has improved   History of complete heart block with Pacemaker - Recent generator change out, cardiology following   Diabetes mellitus type 2, uncontrolled ([place]) - Placed on sliding scale insulin, Add meal coverage   Hypothyroidism - Continue outpatient dose of Synthroid   PAF (paroxysmal atrial fibrillation) (HCC) - Not on any rate controlling medications, has pacemaker - On warfarin per pharmacy    CKD (chronic kidney disease), stage III - Baseline creatinine 1-1.5  GERD - Continue PPI   Code Status: Full CODE STATUS  DVT Prophylaxis: Warfarin   Family Communication: Discussed in detail with the patient, all imaging results, lab results explained to the patient , patient's daughter   Disposition Plan:   Time Spent in minutes   25 minutes  Procedures:  2-D echo  Consultants:   Cardiology  Antimicrobials:      Medications  Scheduled Meds: . aspirin  81 mg Oral Daily  . atorvastatin  20 mg Oral q1800  . bifidobacterium infantis  1 capsule Oral Daily  . cholecalciferol  1,000 Units Oral Daily  . [START ON 06/30/2015] colchicine  0.6 mg Oral Once  . donepezil  5 mg Oral QHS  . folic acid  1 mg Oral Daily  . furosemide  40 mg Intravenous BID  . gabapentin  100 mg Oral BID   . insulin aspart  0-5 Units Subcutaneous QHS  . insulin aspart  0-9 Units Subcutaneous TID WC  . ipratropium-albuterol  3 mL Nebulization TID  . levothyroxine  50 mcg Oral QAC breakfast  . loratadine  10 mg Oral Daily  . nitroGLYCERIN  0.2 mg Transdermal Daily  . pantoprazole  40 mg Oral Daily  . predniSONE  40 mg Oral Q breakfast  . ranolazine  500 mg Oral BID   Continuous Infusions: . heparin 900 Units/hr (06/27/15 1345)   PRN Meds:.acetaminophen, albuterol, atropine, gi cocktail, meclizine, ondansetron (ZOFRAN) IV, traMADol   Antibiotics   Anti-infectives    None        Subjective:   [name] was seen and examined today. Currently no shortness of breath or chest pain. Daughter at the bedside.  Patient denies dizziness, abdominal pain, N/V/D/C, new weakness, numbess, tingling.   Objective:   Filed Vitals:   06/27/15 1229 06/27/15 1311 06/27/15 1939 06/28/15 0404  BP:  90/53 107/63 92/54  Pulse:  96 85 87  Temp:  98.2 F (36.8 C) 98.2 F (36.8 C) 97.6 F (36.4 C)  TempSrc:  Oral Oral Oral  Resp:  _0 Height:      Weight:    86.637 kg (191 lb)  SpO2: 97% 95% 99% 100%    Intake/Output Summary (Last 24 hours) at 06/28/15 1340 Last data filed at 06/28/15 0422  Gross per 24 hour  Intake    240 ml  Output    350 ml  Net   -110 ml     Wt Readings from Last 3 Encounters:  06/28/15 86.637 kg (191 lb)  06/22/15 84.369 kg (186 lb)  03/30/15 84.7 kg (186 lb 11.7 oz)     Exam  General:Sleepy but arousable, NAD   HEENT:    Neck: Supple,+ JVD  Cardiovascular: S1 S2clear, RRR   Respiratory:Fairly clear to auscultation bilaterally   Gastrointestinal: Soft, nontender, nondistended, + bowel sounds  Ext: no cyanosis clubbing or edema  Neuro: no new deficits  Skin: No rashes  Psych: Normal affect and demeanor, alert and oriented x3    Data Reviewed:  I have personally reviewed following labs and imaging studies  Micro Results Recent  Results (from the past 240 hour(s))  Surgical pcr screen     Status: Abnormal   Collection Time: 06/22/15  1:56 PM  Result Value Ref Range Status   MRSA, PCR NEGATIVE NEGATIVE Final   Staphylococcus aureus POSITIVE (A) NEGATIVE Final    Comment:        The Xpert SA Assay (FDA approved for NASAL specimens in patients over 39 years of age), is one component of a comprehensive  surveillance program.  Test performance has been validated by [facility] for patients greater than or equal to 26 year old. It is not intended to diagnose infection nor to guide or monitor treatment.     Radiology Reports Dg Chest Port 1 View  06/27/2015  CLINICAL DATA:  Acute onset of dyspnea.  Initial encounter. EXAM: PORTABLE CHEST 1 VIEW COMPARISON:  Chest radiograph performed 03/29/2015 FINDINGS: The lungs are well-aerated. Hazy bilateral airspace opacification may reflect pneumonia or pulmonary edema. Underlying vascular congestion is noted. No pleural effusion or pneumothorax is seen. The cardiomediastinal silhouette is mildly enlarged. The patient is status post median sternotomy, with evidence of prior CABG. A pacemaker is noted overlying the left chest wall, with leads ending overlying the right atrium and right ventricle. No acute osseous abnormalities are seen. IMPRESSION: 1. Hazy bilateral airspace opacification may reflect pneumonia or pulmonary edema. 2. Underlying vascular congestion and mild cardiomegaly. Electronically Signed   By: Garald Balding M.D.   On: 06/27/2015 06:09   Dg Knee Complete 4 Views Left  06/26/2015  CLINICAL DATA:  Pt having left knee pain. Pt states pain is chronic, no injury. Pain is radiating down into her lower leg. EXAM: LEFT KNEE - COMPLETE 4+ VIEW COMPARISON:  None. FINDINGS: Vascular calcifications. Moderate 3 compartment osteoarthritis, with joint space narrowing and osteophyte formation. Small suprapatellar joint effusion. IMPRESSION: Osteoarthritis with small suprapatellar  joint effusion. Electronically Signed   By: Abigail Miyamoto M.D.   On: 06/26/2015 11:42    Lab Data:  CBC:  Recent Labs Lab 06/24/15 1600 06/25/15 0906 06/27/15 0245 06/28/15 0305  WBC 6.9 5.9 4.9 7.5  NEUTROABS 4.9  --   --   --   HGB 10.7* 10.5* 11.9* 10.5*  HCT 34.3* 33.7* 37.5 33.1*  MCV 91.7 89.9 89.1 89.7  PLT 110* 128* 138* 790*   Basic Metabolic Panel:  Recent Labs Lab 06/24/15 1600 06/25/15 0906 06/27/15 0245 06/28/15 0305  NA 135 139 136 136  K 5.6* 4.0 3.7 4.1  CL 103 102 98* 98*  CO2 _0 GLUCOSE 190* 141* 266* 248*  BUN _1 33*  CREATININE 1.24* 1.18* 1.24* 1.65*  CALCIUM 8.7* 9.1 9.2 8.8*  MG 2.0  --   --   --    GFR: Estimated Creatinine Clearance: 22.7 mL/min (by C-G formula based on Cr of 1.65). Liver Function Tests: No results for input(s): AST, ALT, ALKPHOS, BILITOT, PROT, ALBUMIN in the last 168 hours. No results for input(s): LIPASE, AMYLASE in the last 168 hours. No results for input(s): AMMONIA in the last 168 hours. Coagulation Profile:  Recent Labs Lab 06/24/15 2001 06/25/15 0906 06/26/15 0255 06/27/15 0245 06/28/15 0305  INR 1.99* 2.05* 2.25* 1.86* 2.01*   Cardiac Enzymes:  Recent Labs Lab 06/25/15 1848 06/27/15 0602 06/27/15 1155 06/27/15 1714 06/28/15 0025  TROPONINI 0.18* 0.10* 0.98* 4.20* 6.31*   BNP (last 3 results) No results for input(s): PROBNP in the last 8760 hours. HbA1C: No results for input(s): HGBA1C in the last 72 hours. CBG:  Recent Labs Lab 06/27/15 1147 06/27/15 1625 06/27/15 2034 06/28/15 0601 06/28/15 1141  GLUCAP 262* 267* 248* 233* 210*   Lipid Profile: No results for input(s): CHOL, HDL, LDLCALC, TRIG, CHOLHDL, LDLDIRECT in the last 72 hours. Thyroid Function Tests: No results for input(s): TSH, T4TOTAL, FREET4, T3FREE, THYROIDAB in the last 72 hours. Anemia Panel: No results for input(s): VITAMINB12, FOLATE, FERRITIN, TIBC, IRON, RETICCTPCT in the last 72 hours. Urine  analysis:    Component Value Date/Time   COLORURINE AMBER* 03/29/2015 2212   APPEARANCEUR TURBID* 03/29/2015 2212   LABSPEC 1.015 03/29/2015 2212   PHURINE 6.5 03/29/2015 2212   GLUCOSEU NEGATIVE 03/29/2015 2212   GLUCOSEU NEGATIVE 04/01/2013 1550   HGBUR MODERATE* 03/29/2015 2212   BILIRUBINUR NEGATIVE 03/29/2015 2212   KETONESUR NEGATIVE 03/29/2015 2212   PROTEINUR NEGATIVE 03/29/2015 2212   UROBILINOGEN 1.0 08/16/2014 1521   NITRITE NEGATIVE 03/29/2015 2212   LEUKOCYTESUR LARGE* 03/29/2015 2212     [name] M.D. [facility] 06/28/2015, 1:40 PM  Pager: [phone] Between 7am to 7pm - call Pager - [phone]  After 7pm go to [email or web address] - password [email or web address]  Call night coverage person covering after 7pm

## 2015-06-29 LAB — GLUCOSE, CAPILLARY
GLUCOSE-CAPILLARY: 250 mg/dL — AB (ref 65–99)
GLUCOSE-CAPILLARY: 141 mg/dL — AB (ref 65–99)
GLUCOSE-CAPILLARY: 63 mg/dL — AB (ref 65–99)
GLUCOSE-CAPILLARY: 104 mg/dL — AB (ref 65–99)
GLUCOSE-CAPILLARY: 133 mg/dL — AB (ref 65–99)

## 2015-06-29 LAB — BASIC METABOLIC PANEL
ANION GAP: 11 (ref 5–15)
BUN: 42 mg/dL — AB (ref 6–20)
CALCIUM: 8.9 mg/dL (ref 8.9–10.3)
CO2: 28 mmol/L (ref 22–32)
CREATININE: 1.9 mg/dL — AB (ref 0.44–1.00)
Chloride: 96 mmol/L — ABNORMAL LOW (ref 101–111)
GFR calc Af Amer: 25 mL/min — ABNORMAL LOW (ref >60)
GFR, EST NON AFRICAN AMERICAN: 22 mL/min — AB (ref >60)
GLUCOSE: 270 mg/dL — AB (ref 65–99)
Potassium: 3.6 mmol/L (ref 3.5–5.1)
Sodium: 135 mmol/L (ref 135–145)

## 2015-06-29 LAB — CBC
HCT: 31.4 % — ABNORMAL LOW (ref 36.0–46.0)
HEMOGLOBIN: 9.9 g/dL — AB (ref 12.0–15.0)
MCH: 27.7 pg (ref 26.0–34.0)
MCHC: 31.5 g/dL (ref 30.0–36.0)
MCV: 88 fL (ref 78.0–100.0)
PLATELETS: 144 10*3/uL — AB (ref 150–400)
RBC: 3.57 MIL/uL — ABNORMAL LOW (ref 3.87–5.11)
RDW: 14.4 % (ref 11.5–15.5)
WBC: 9.6 10*3/uL (ref 4.0–10.5)

## 2015-06-29 LAB — PROTIME-INR
INR: 2.12 — ABNORMAL HIGH (ref 0.00–1.49)
PROTHROMBIN TIME: 23.6 s — AB (ref 11.6–15.2)

## 2015-06-29 LAB — TROPONIN I
TROPONIN I: 5.13 ng/mL — AB (ref <0.031)
Troponin I: 6.82 ng/mL (ref <0.031)

## 2015-06-29 MED ORDER — INSULIN ASPART 100 UNIT/ML ~~LOC~~ SOLN
0.0000 [IU] | Freq: Three times a day (TID) | SUBCUTANEOUS | Status: DC
  Administered 2015-06-29: 2 [IU] via SUBCUTANEOUS
  Administered 2015-06-30 (×2): 3 [IU] via SUBCUTANEOUS
  Administered 2015-07-01 (×2): 2 [IU] via SUBCUTANEOUS

## 2015-06-29 MED ORDER — ZOLPIDEM TARTRATE 5 MG PO TABS
5.0000 mg | ORAL_TABLET | Freq: Every evening | ORAL | Status: DC | PRN
  Administered 2015-06-29 – 2015-06-30 (×2): 5 mg via ORAL
  Filled 2015-06-29 (×2): qty 1

## 2015-06-29 MED ORDER — FUROSEMIDE 40 MG PO TABS
40.0000 mg | ORAL_TABLET | Freq: Two times a day (BID) | ORAL | Status: DC
  Administered 2015-06-29 – 2015-07-01 (×5): 40 mg via ORAL
  Filled 2015-06-29 (×5): qty 1

## 2015-06-29 MED ORDER — INSULIN ASPART 100 UNIT/ML ~~LOC~~ SOLN
6.0000 [IU] | Freq: Three times a day (TID) | SUBCUTANEOUS | Status: DC
Start: 2015-06-29 — End: 2015-06-30
  Administered 2015-06-29 (×2): 6 [IU] via SUBCUTANEOUS

## 2015-06-29 NOTE — Progress Notes (Signed)
Patient Name: [name] Date of Encounter: 06/29/2015  Principal Problem:   Chest pain Active Problems:   Coronary artery disease, CABG in 1994. Myoview abnormal Aug 2015- medical Rx   Pacemaker   Hyperkalemia   Diabetes mellitus type 2, uncontrolled (HCC)   Hypothyroidism   PAF (paroxysmal atrial fibrillation) (HCC)   Complete heart block (HCC)   CKD (chronic kidney disease), stage III   Elevated troponin I level   Acute on chronic systolic and diastolic heart failure, NYHA class 1 ([place])   Primary Cardiologist: Dr. Sallyanne Kuster Patient Profile: [name] is a 78F with chronic systolic and diastolic heart failure LVEF 45-50%, medically managed CAD, hypertension, and hyperlipidemia here with chest pain.Intitially her pain was reproducible with palpation especially near PPM site as she recently had a device change out, however troponin elevated at 6.31 consistent with NSTEMI.   SUBJECTIVE: Feels well, no chest pain or SOB.   OBJECTIVE Filed Vitals:   06/28/15 1945 06/28/15 2059 06/29/15 0416 06/29/15 0833  BP: 99/59  94/53   Pulse: 87 87 95   Temp: 98.7 F (37.1 C)     TempSrc: Oral     Resp: 18 16 16    Height:      Weight:   194 lb (87.998 kg)   SpO2: 98% 100% 98% 100%    Intake/Output Summary (Last 24 hours) at 06/29/15 1029 Last data filed at 06/29/15 1011  Gross per 24 hour  Intake 773.57 ml  Output    800 ml  Net -26.43 ml   Filed Weights   06/27/15 0633 06/28/15 0404 06/29/15 0416  Weight: 198 lb 1.6 oz (89.858 kg) 191 lb (86.637 kg) 194 lb (87.998 kg)    PHYSICAL EXAM General: Well developed, well nourished, female in no acute distress. Head: Normocephalic, atraumatic.  Neck: Supple without bruits, No JVD. Lungs:  Resp regular and unlabored, CTA. Heart: RRR, S1, S2, no S3, S4, or murmur; no rub. Abdomen: Soft, non-tender, non-distended, BS + x 4.  Extremities: No clubbing, cyanosis, No edema.  Neuro: Alert and oriented X 3. Moves all  extremities spontaneously. Psych: Normal affect.  LABS: CBC: Recent Labs  06/28/15 0305 06/29/15 0235  WBC 7.5 9.6  HGB 10.5* 9.9*  HCT 33.1* 31.4*  MCV 89.7 88.0  PLT 138* 144*   INR: Recent Labs  06/29/15 0235  INR [id number]*   Basic Metabolic Panel: Recent Labs  06/28/15 0305 06/29/15 0235  NA 136 135  K 4.1 3.6  CL 98* 96*  CO2 27 28  GLUCOSE 248* 270*  BUN 33* 42*  CREATININE 1.65* 1.90*  CALCIUM 8.8* 8.9   Cardiac Enzymes: Recent Labs  06/27/15 1155 06/27/15 1714 06/28/15 0025  TROPONINI 0.98* 4.20* 6.31*    Current facility-administered medications:  .  acetaminophen (TYLENOL) tablet 500 mg, 500 mg, Oral, Q6H PRN, Ripudeep K Rai, MD .  albuterol (PROVENTIL) (2.5 MG/3ML) 0.083% nebulizer solution 3 mL, 3 mL, Inhalation, Q6H PRN, Ripudeep K Rai, MD, 3 mL at 06/27/15 0412 .  aspirin chewable tablet 81 mg, 81 mg, Oral, Daily, [employer], PA-C, 81 mg at 06/29/15 1022 .  atorvastatin (LIPITOR) tablet 20 mg, 20 mg, Oral, q1800, Skeet Latch, MD, 20 mg at 06/28/15 1721 .  atropine 1 % ophthalmic solution 1 drop, 1 drop, Both Eyes, Daily PRN, Ripudeep K Rai, MD, 1 drop at 06/29/15 0537 .  bifidobacterium infantis (ALIGN) capsule 1 capsule, 1 capsule, Oral, Daily, Ripudeep Krystal Eaton, MD, 1 capsule  at 06/28/15 1100 .  cholecalciferol (VITAMIN D) tablet 1,000 Units, 1,000 Units, Oral, Daily, Ripudeep K Rai, MD, 1,000 Units at 06/29/15 1022 .  [START ON 06/30/2015] colchicine tablet 0.6 mg, 0.6 mg, Oral, Once, Ripudeep K Rai, MD .  donepezil (ARICEPT) tablet 5 mg, 5 mg, Oral, QHS, Ripudeep K Rai, MD, 5 mg at [id number] .  folic acid (FOLVITE) tablet 1 mg, 1 mg, Oral, Daily, Ripudeep K Rai, MD, 1 mg at 06/29/15 1022 .  furosemide (LASIX) injection 40 mg, 40 mg, Intravenous, BID, Skeet Latch, MD, 40 mg at 06/29/15 0806 .  gabapentin (NEURONTIN) capsule 100 mg, 100 mg, Oral, BID, Ripudeep K Rai, MD, 100 mg at 06/29/15 1022 .  gi cocktail  (Maalox,Lidocaine,Donnatal), 30 mL, Oral, QID PRN, Ripudeep K Rai, MD, 30 mL at 06/27/15 0412 .  heparin ADULT infusion 100 units/mL (25000 units/2108mL sodium chloride 0.45%), 800 Units/hr, Intravenous, Continuous, Kimberly B Hammons, RPH, Last Rate: 8 mL/hr at 06/28/15 1725, 800 Units/hr at 06/28/15 1725 .  insulin aspart (novoLOG) injection 0-5 Units, 0-5 Units, Subcutaneous, QHS, Ripudeep K Rai, MD, 2 Units at 06/28/15 2116 .  insulin aspart (novoLOG) injection 0-9 Units, 0-9 Units, Subcutaneous, TID WC, Ripudeep Krystal Eaton, MD, 3 Units at 06/29/15 [id number] .  insulin aspart (novoLOG) injection 6 Units, 6 Units, Subcutaneous, TID WC, Ripudeep Krystal Eaton, MD, 6 Units at 06/29/15 0806 .  ipratropium-albuterol (DUONEB) 0.5-2.5 (3) MG/3ML nebulizer solution 3 mL, 3 mL, Nebulization, TID, Ripudeep K Rai, MD, 3 mL at 06/29/15 0833 .  levothyroxine (SYNTHROID, LEVOTHROID) tablet 50 mcg, 50 mcg, Oral, QAC breakfast, Ripudeep K Rai, MD, 50 mcg at 06/29/15 [id number] .  loratadine (CLARITIN) tablet 10 mg, 10 mg, Oral, Daily, Ripudeep K Rai, MD, 10 mg at 06/29/15 1022 .  meclizine (ANTIVERT) tablet 25 mg, 25 mg, Oral, TID PRN, Ripudeep K Rai, MD .  nitroGLYCERIN (NITRODUR - Dosed in mg/24 hr) patch 0.2 mg, 0.2 mg, Transdermal, Daily, Doreene Burke Kilroy, PA-C, 0.2 mg at 06/28/15 1110 .  ondansetron (ZOFRAN) injection 4 mg, 4 mg, Intravenous, Q6H PRN, Ripudeep K Rai, MD .  pantoprazole (PROTONIX) EC tablet 40 mg, 40 mg, Oral, Daily, Ripudeep K Rai, MD, 40 mg at 06/29/15 1022 .  ranolazine ([name]) 12 hr tablet 500 mg, 500 mg, Oral, BID, Ripudeep K Rai, MD, 500 mg at 06/29/15 1022 .  traMADol (ULTRAM) tablet 50 mg, 50 mg, Oral, Q8H PRN, Ripudeep K Rai, MD, 50 mg at 06/28/15 2115 .  zolpidem (AMBIEN) tablet 5 mg, 5 mg, Oral, QHS PRN, Ripudeep K Rai, MD . heparin 800 Units/hr (06/28/15 1725)   TELE: NSR        ECG: NSR    Current Medications:  . aspirin  81 mg Oral Daily  . atorvastatin  20 mg Oral q1800  . bifidobacterium  infantis  1 capsule Oral Daily  . cholecalciferol  1,000 Units Oral Daily  . [START ON 06/30/2015] colchicine  0.6 mg Oral Once  . donepezil  5 mg Oral QHS  . folic acid  1 mg Oral Daily  . furosemide  40 mg Intravenous BID  . gabapentin  100 mg Oral BID  . insulin aspart  0-5 Units Subcutaneous QHS  . insulin aspart  0-9 Units Subcutaneous TID WC  . insulin aspart  6 Units Subcutaneous TID WC  . ipratropium-albuterol  3 mL Nebulization TID  . levothyroxine  50 mcg Oral QAC breakfast  . loratadine  10 mg Oral Daily  . nitroGLYCERIN  0.2 mg Transdermal Daily  . pantoprazole  40 mg Oral Daily  . ranolazine  500 mg Oral BID   . heparin 800 Units/hr (06/28/15 1725)    ASSESSMENT AND PLAN: Principal Problem:   Chest pain Active Problems:   Coronary artery disease, CABG in 1994. Myoview abnormal Aug 2015- medical Rx   Pacemaker   Hyperkalemia   Diabetes mellitus type 2, uncontrolled (HCC)   Hypothyroidism   PAF (paroxysmal atrial fibrillation) (HCC)   Complete heart block (HCC)   CKD (chronic kidney disease), stage III   Elevated troponin I level   Acute on chronic systolic and diastolic heart failure, NYHA class 1 ([place])  1. Elevated troponin: Patient's troponin elevated this am. EKG is consistent with paced rhythm. Her troponin elevation is consistent with possible infarct, however patient is not a candidate for a heart catheterization. It is best that she be managed medically given her CKD. She is on heparin gtt will continue for 48 hours. Will continue ASA. She is on [name] and transdermal NTG, continue same. She is chest pain free.   2. Acute on chronic combined diastolic and systolic CHF: LVEF [id number], grade 2 diastolic dysfunction. She has not responded well with IV diureses, only had 450 of urine out yesterday with 2 doses of IV Lasix. Creatinine is up to 1.90, and BP is soft at 94/53. Need to stop IV diuresis today and switch to po. Perhaps can switch to torsemide for better gut  absorption.   BP is too soft for addition of Coreg.   3. History of 2nd degree AVB/complete heart block status post [employer] pacemaker placement: had generator change out on 06/22/15. Site looks stable, no sign of infection.   4. DM: management per primary team.     Signed, Arbutus Leas , NP 10:29 AM 06/29/2015 Pager [phone]   Attending Note:   The patient was seen and examined.  Agree with assessment and plan as noted above.  Changes made to the above note as needed.  Patient seen and independently examined with Jettie Booze, NP.   We discussed all aspects of the encounter. I agree with the assessment and plan as stated above.  Ms. Fairall is seen today  The daughter has many concerns today. Wanted [place] to order a long list of assays. None of the ones listed are indicated in this patient.  We have changed her Lasix to PO   I have discussed the case with Dr. Tana Coast. Will DC the heparin Resume coumadin  At this point , there is not much to add  She needs to get up and move about.    I have spent a total of 40 minutes with patient reviewing hospital  notes , telemetry, EKGs, labs and examining patient as well as establishing an assessment and plan that was discussed with the patient. > 50% of time was spent in direct patient care.    Thayer Headings, Brooke Bonito., MD, [facility] 06/29/2015, 10:52 AM [address]  [place] Pager [phone]

## 2015-06-29 NOTE — Progress Notes (Signed)
ANTICOAGULATION CONSULT NOTE - Follow Up Consult  Pharmacy Consult for Heparin (warfarin on hold) Indication: atrial fibrillation  Allergies  Allergen Reactions  . Darvon Other (See Comments)    confusion  . Digoxin And Related Nausea Only  . Penicillins Other (See Comments)    Was told had allergy from childhood... Unknown reaction  . Percocet [Oxycodone-Acetaminophen] Other (See Comments)    confusion  . Percodan [Oxycodone-Aspirin] Other (See Comments)    confusion  . Vicodin [Hydrocodone-Acetaminophen] Other (See Comments)    confusion    Patient Measurements: Height: 5\' 4"  (162.6 cm) Weight: 194 lb (87.998 kg) IBW/kg (Calculated) : 54.7  Vital Signs: BP: 94/53 mmHg (06/13 0416) Pulse Rate: 95 (06/13 0416)  Labs:  Recent Labs  06/27/15 0245  06/27/15 1155 06/27/15 1714 06/27/15 2312 06/28/15 0025 06/28/15 0305 06/28/15 0732 06/29/15 0235  HGB 11.9*  --   --   --   --   --  10.5*  --  9.9*  HCT 37.5  --   --   --   --   --  33.1*  --  31.4*  PLT 138*  --   --   --   --   --  138*  --  144*  LABPROT 21.4*  --   --   --   --   --  22.7*  --  23.6*  INR 1.86*  --   --   --   --   --  2.01*  --  2.12*  HEPARINUNFRC  --   --   --   --  0.34  --   --  0.67  --   CREATININE 1.24*  --   --   --   --   --  1.65*  --  1.90*  TROPONINI  --   < > 0.98* 4.20*  --  6.31*  --   --   --   < > = values in this interval not displayed.  Estimated Creatinine Clearance: 19.9 mL/min (by C-G formula based on Cr of 1.9).   Assessment: 82 yo F on Coumadin PTA for hx afib.  Pt transitioned to heparin 6/11 with elevated troponin and suspected MI.  Per cardiology patient is not a candidate for a heart catheterizattion. Plan to manage medically given her CKD.  Heparin level not done this AM.  Uncertain why. Patient Regis Bill has been refusing labs earlier.  INR is therapeutic = 2.12 today.  Hgb 11.9> 10.5>9.9, PLTC 138>144k.   No bleeding noted.  Goal of Therapy:  Heparin level  0.3-0.7 units/ml Monitor platelets by anticoagulation protocol: Yes   Plan:  Currently on IV heparin infusion at  800 units/hr. Cardiologist plans to discontinue IV heparin today.  Potential discharge today.  Daily heparin level, CBC if heparin drip continues.  Daily INR ? Restart Coumadin on discharge. If patient not discharged, f/u if Dr. Tana Coast wants to resume coumadin.  Nicole Cella, RPh Clinical Pharmacist Pager: [phone] 06/29/2015 10:46 AM

## 2015-06-29 NOTE — Progress Notes (Signed)
[facility]                                                                              Patient Demographics  [name], is a 82 y.o. female, DOB - 12/31/1920, RAX:[id number]  Admit date - 06/24/2015   Admitting Physician Ripudeep Krystal Eaton, MD  Outpatient Primary MD for the patient is [name], MD  Outpatient specialists:   LOS -   days    Chief Complaint  Patient presents with  . Pacemaker Problem       Brief summary   Patient is a 82 year old female with history of complete heart block status post pacemaker with recent dual-chamber generator change out on 6/6, hypogonadism, hypertension, hyperlipidemia, diabetes mellitus, CAD, chronic kidney disease stage III presented to ED with episode of chest pain today. History was obtained from her multiple family members in her room. Per family members, they were concerned that her heart rate was in's 80s and prior to the pacemaker generator change it always had been in 35s. This morning, patient complained of chest discomfort first in the area of the pacemaker then radiating to the left chest and left arm. Patient was not able to qualify the chest pain. The episode was also associated with shortness of breath, patient was noticed to be clammy and diaphoretic by the family members. The whole episode lasted for 30-40 minutes and spontaneously resolved. First set of troponin mildly positive.   Assessment & Plan    Atypical Chest pain/NSTEMI: Has a history of CAD, CABG in 1994. Myoview abnormal Aug 2015- medical Rx -  2-D echocardiogram with EF 81-10%, grade 2 diastolic dysfunction. Prior echo 8/16 had shown EF of 30-35% with grade 1 diastolic dysfunction - Serial cardiac enzymes showed troponin is trending up, elevated at 6.31, Repeat pending -cardiology following, was started on heparin drip, currently recommending medical management given age, multiple medical problems, CKD, cath is not recommended.   Active  Problems: Acute on chronic combined systolic and diastolic CHF:  - cardiology following, patient was placed on IV Lasix -Creatinine trending up due to diuresis, cardiology recommended to transition to oral Lasix  Left knee pain, foot pain, leg pain with underlying history of neuropathy - Left knee x-ray showed small suprapatellar joint effusion and osteoarthritis - ESR, CRP elevated with uric acid 7.9, placed on colchicine and 3 doses of prednisone. No NSAIDs secondary to chronic kidney disease and CHF - Knee pain has improved   History of complete heart block with Pacemaker - Recent generator change out, cardiology following   Diabetes mellitus type 2, uncontrolled ([place]) - Placed on sliding scale insulin, Add meal coverage   Hypothyroidism - Continue outpatient dose of Synthroid   PAF (paroxysmal atrial fibrillation) (HCC) - Not on any rate controlling medications, has pacemaker - On warfarin per pharmacy    CKD (chronic kidney disease), stage III - Baseline creatinine 1-1.5  GERD - Continue PPI  Generalized debility Had a long conversation with patient's daughter and other family members at the bedside, start PTOT evaluation, out of bed to chair, increase ambulation. Medical management is now recommended by cardiology for NSTEMI and  nothing further to add.    Code Status: Full CODE STATUS  DVT Prophylaxis: Warfarin   Family Communication: Discussed in detail with the patient, all imaging results, lab results explained to the patient , patient's daughter and other family members  Disposition Plan: Hopefully DC in 24-48 hours  Time Spent in minutes   25 minutes  Procedures:  2-D echo  Consultants:   Cardiology  Antimicrobials:      Medications  Scheduled Meds: . aspirin  81 mg Oral Daily  . atorvastatin  20 mg Oral q1800  . bifidobacterium infantis  1 capsule Oral Daily  . cholecalciferol  1,000 Units Oral Daily  . [START ON 06/30/2015] colchicine   0.6 mg Oral Once  . donepezil  5 mg Oral QHS  . folic acid  1 mg Oral Daily  . furosemide  40 mg Intravenous BID  . gabapentin  100 mg Oral BID  . insulin aspart  0-5 Units Subcutaneous QHS  . insulin aspart  0-9 Units Subcutaneous TID WC  . insulin aspart  6 Units Subcutaneous TID WC  . ipratropium-albuterol  3 mL Nebulization TID  . levothyroxine  50 mcg Oral QAC breakfast  . loratadine  10 mg Oral Daily  . nitroGLYCERIN  0.2 mg Transdermal Daily  . pantoprazole  40 mg Oral Daily  . ranolazine  500 mg Oral BID   Continuous Infusions:   PRN Meds:.acetaminophen, albuterol, atropine, gi cocktail, meclizine, ondansetron (ZOFRAN) IV, traMADol, zolpidem   Antibiotics   Anti-infectives    None        Subjective:   [name] was seen and examined today.Patient is getting better, no complaints of any chest pain. No wheezing today. Getting weak and deconditioned in the bed. Daughter and other family members at the bedside.  Patient denies dizziness, abdominal pain, N/V/D/C, new weakness, numbess, tingling.   Objective:   Filed Vitals:   06/28/15 1945 06/28/15 2059 06/29/15 0416 06/29/15 0833  BP: 99/59  94/53   Pulse: 87 87 95   Temp: 98.7 F (37.1 C)     TempSrc: Oral     Resp: '18 16 16   '$ Height:      Weight:   87.998 kg (194 lb)   SpO2: 98% 100% 98% 100%    Intake/Output Summary (Last 24 hours) at 06/29/15 1139 Last data filed at 06/29/15 1040  Gross per 24 hour  Intake 948.57 ml  Output    800 ml  Net 148.57 ml     Wt Readings from Last 3 Encounters:  06/29/15 87.998 kg (194 lb)  06/22/15 84.369 kg (186 lb)  03/30/15 84.7 kg (186 lb 11.7 oz)     Exam  General:Alert and oriented 3 NAD  HEENT:    Neck: Supple, no JVD  Cardiovascular: S1 S2clear, RRR   Respiratory:Fairly clear to auscultation bilaterally   Gastrointestinal: Soft, NT, ND, NBS  Ext: no cyanosis clubbing or edema  Neuro: no new deficits  Skin: No rashes  Psych: Normal  affect and demeanor, alert and oriented x3    Data Reviewed:  I have personally reviewed following labs and imaging studies  Micro Results Recent Results (from the past 240 hour(s))  Surgical pcr screen     Status: Abnormal   Collection Time: 06/22/15  1:56 PM  Result Value Ref Range Status   MRSA, PCR NEGATIVE NEGATIVE Final   Staphylococcus aureus POSITIVE (A) NEGATIVE Final    Comment:        The Xpert SA  Assay (FDA approved for NASAL specimens in patients over 25 years of age), is one component of a comprehensive surveillance program.  Test performance has been validated by [facility] for patients greater than or equal to 38 year old. It is not intended to diagnose infection nor to guide or monitor treatment.     Radiology Reports Dg Chest Port 1 View  06/27/2015  CLINICAL DATA:  Acute onset of dyspnea.  Initial encounter. EXAM: PORTABLE CHEST 1 VIEW COMPARISON:  Chest radiograph performed 03/29/2015 FINDINGS: The lungs are well-aerated. Hazy bilateral airspace opacification may reflect pneumonia or pulmonary edema. Underlying vascular congestion is noted. No pleural effusion or pneumothorax is seen. The cardiomediastinal silhouette is mildly enlarged. The patient is status post median sternotomy, with evidence of prior CABG. A pacemaker is noted overlying the left chest wall, with leads ending overlying the right atrium and right ventricle. No acute osseous abnormalities are seen. IMPRESSION: 1. Hazy bilateral airspace opacification may reflect pneumonia or pulmonary edema. 2. Underlying vascular congestion and mild cardiomegaly. Electronically Signed   By: Garald Balding M.D.   On: 06/27/2015 06:09   Dg Knee Complete 4 Views Left  06/26/2015  CLINICAL DATA:  Pt having left knee pain. Pt states pain is chronic, no injury. Pain is radiating down into her lower leg. EXAM: LEFT KNEE - COMPLETE 4+ VIEW COMPARISON:  None. FINDINGS: Vascular calcifications. Moderate 3 compartment  osteoarthritis, with joint space narrowing and osteophyte formation. Small suprapatellar joint effusion. IMPRESSION: Osteoarthritis with small suprapatellar joint effusion. Electronically Signed   By: Abigail Miyamoto M.D.   On: 06/26/2015 11:42    Lab Data:  CBC:  Recent Labs Lab 06/24/15 1600 06/25/15 0906 06/27/15 0245 06/28/15 0305 06/29/15 0235  WBC 6.9 5.9 4.9 7.5 9.6  NEUTROABS 4.9  --   --   --   --   HGB 10.7* 10.5* 11.9* 10.5* 9.9*  HCT 34.3* 33.7* 37.5 33.1* 31.4*  MCV 91.7 89.9 89.1 89.7 88.0  PLT 110* 128* 138* 138* 580*   Basic Metabolic Panel:  Recent Labs Lab 06/24/15 1600 06/25/15 0906 06/27/15 0245 06/28/15 0305 06/29/15 0235  NA 135 139 136 136 135  K 5.6* 4.0 3.7 4.1 3.6  CL 103 102 98* 98* 96*  CO2 '26 29 24 27 28  '$ GLUCOSE 190* 141* 266* 248* 270*  BUN '18 15 20 '$ 33* 42*  CREATININE 1.24* 1.18* 1.24* 1.65* 1.90*  CALCIUM 8.7* 9.1 9.2 8.8* 8.9  MG 2.0  --   --   --   --    GFR: Estimated Creatinine Clearance: 19.9 mL/min (by C-G formula based on Cr of 1.9). Liver Function Tests: No results for input(s): AST, ALT, ALKPHOS, BILITOT, PROT, ALBUMIN in the last 168 hours. No results for input(s): LIPASE, AMYLASE in the last 168 hours. No results for input(s): AMMONIA in the last 168 hours. Coagulation Profile:  Recent Labs Lab 06/25/15 0906 06/26/15 0255 06/27/15 0245 06/28/15 0305 06/29/15 0235  INR 2.05* 2.25* 1.86* 2.01* 2.12*   Cardiac Enzymes:  Recent Labs Lab 06/25/15 1848 06/27/15 0602 06/27/15 1155 06/27/15 1714 06/28/15 0025  TROPONINI 0.18* 0.10* 0.98* 4.20* 6.31*   BNP (last 3 results) No results for input(s): PROBNP in the last 8760 hours. HbA1C: No results for input(s): HGBA1C in the last 72 hours. CBG:  Recent Labs Lab 06/28/15 0601 06/28/15 1141 06/28/15 1730 06/28/15 2116 06/29/15 0621  GLUCAP 233* 210* 226* 249* 250*   Lipid Profile: No results for input(s): CHOL, HDL, LDLCALC, TRIG,  CHOLHDL, LDLDIRECT in the  last 72 hours. Thyroid Function Tests: No results for input(s): TSH, T4TOTAL, FREET4, T3FREE, THYROIDAB in the last 72 hours. Anemia Panel: No results for input(s): VITAMINB12, FOLATE, FERRITIN, TIBC, IRON, RETICCTPCT in the last 72 hours. Urine analysis:    Component Value Date/Time   COLORURINE AMBER* 03/29/2015 2212   APPEARANCEUR TURBID* 03/29/2015 2212   LABSPEC 1.015 03/29/2015 2212   PHURINE 6.5 03/29/2015 2212   GLUCOSEU NEGATIVE 03/29/2015 2212   GLUCOSEU NEGATIVE 04/01/2013 1550   HGBUR MODERATE* 03/29/2015 2212   BILIRUBINUR NEGATIVE 03/29/2015 2212   KETONESUR NEGATIVE 03/29/2015 2212   PROTEINUR NEGATIVE 03/29/2015 2212   UROBILINOGEN 1.0 08/16/2014 1521   NITRITE NEGATIVE 03/29/2015 2212   LEUKOCYTESUR LARGE* 03/29/2015 2212     RAI,RIPUDEEP M.D. [facility] 06/29/2015, 11:39 AM  Pager: [phone] Between 7am to 7pm - call Pager - [phone]  After 7pm go to [email or web address] - password [email or web address]  Call night coverage person covering after 7pm

## 2015-06-29 NOTE — Progress Notes (Signed)
1653 - CBG = 63; pt was given orange juice x2, graham crackers and peanut butter.   1807 - CBG = 104.

## 2015-06-29 NOTE — Progress Notes (Signed)
PT Cancellation Note  Patient Details Name: [name] MRN: [id number] DOB: 10/28/1920   Cancelled Treatment:    Reason Eval/Treat Not Completed: Medical issues which prohibited therapy.  Troponin trending up w/ suspicion for NSTEMI per chart review.  PT will continue to follow.  [name] PT, DPT  Pager: [phone] Phone: [phone] 06/29/2015, 10:50 AM

## 2015-06-30 DIAGNOSIS — Z515 Encounter for palliative care: Secondary | ICD-10-CM | POA: Insufficient documentation

## 2015-06-30 DIAGNOSIS — R531 Weakness: Secondary | ICD-10-CM | POA: Insufficient documentation

## 2015-06-30 DIAGNOSIS — Z7189 Other specified counseling: Secondary | ICD-10-CM | POA: Insufficient documentation

## 2015-06-30 DIAGNOSIS — I214 Non-ST elevation (NSTEMI) myocardial infarction: Secondary | ICD-10-CM | POA: Insufficient documentation

## 2015-06-30 LAB — GLUCOSE, CAPILLARY
GLUCOSE-CAPILLARY: 111 mg/dL — AB (ref 65–99)
GLUCOSE-CAPILLARY: 159 mg/dL — AB (ref 65–99)
Glucose-Capillary: 164 mg/dL — ABNORMAL HIGH (ref 65–99)
Glucose-Capillary: 135 mg/dL — ABNORMAL HIGH (ref 65–99)

## 2015-06-30 LAB — TROPONIN I: TROPONIN I: 3.81 ng/mL — AB (ref <0.031)

## 2015-06-30 MED ORDER — SENNA 8.6 MG PO TABS
1.0000 | ORAL_TABLET | Freq: Two times a day (BID) | ORAL | Status: DC
  Administered 2015-06-30 – 2015-07-01 (×3): 8.6 mg via ORAL
  Filled 2015-06-30 (×3): qty 1

## 2015-06-30 MED ORDER — BISACODYL 10 MG RE SUPP
10.0000 mg | Freq: Once | RECTAL | Status: AC
  Administered 2015-06-30: 10 mg via RECTAL
  Filled 2015-06-30: qty 1

## 2015-06-30 MED ORDER — IPRATROPIUM-ALBUTEROL 0.5-2.5 (3) MG/3ML IN SOLN: 3.0000 mL | RESPIRATORY_TRACT | Status: DC | PRN

## 2015-06-30 MED ORDER — INSULIN ASPART 100 UNIT/ML ~~LOC~~ SOLN: 6.0000 [IU] | Freq: Three times a day (TID) | SUBCUTANEOUS | Status: DC

## 2015-06-30 MED ORDER — POLYETHYLENE GLYCOL 3350 17 G PO PACK: 17.0000 g | PACK | Freq: Every day | ORAL | Status: DC | PRN

## 2015-06-30 NOTE — Progress Notes (Signed)
Notified Palliative Wadie Lessen, NP, that family requests to speak with her at bedside at 1145.

## 2015-06-30 NOTE — Progress Notes (Signed)
Patient Name: [name] Date of Encounter: 06/30/2015  Principal Problem:   Chest pain Active Problems:   Coronary artery disease, CABG in 1994. Myoview abnormal Aug 2015- medical Rx   Pacemaker   Hyperkalemia   Diabetes mellitus type 2, uncontrolled (HCC)   Hypothyroidism   PAF (paroxysmal atrial fibrillation) (HCC)   Complete heart block (HCC)   CKD (chronic kidney disease), stage III   Elevated troponin I level   Acute on chronic systolic and diastolic heart failure, NYHA class 1 ([place])   Primary Cardiologist: Dr. Sallyanne Kuster Patient Profile: [name] is a 89F with chronic systolic and diastolic heart failure LVEF 45-50%, medically managed CAD, hypertension, and hyperlipidemia here with chest pain.Intitially her pain was reproducible with palpation especially near PPM site as she recently had a device change out, however troponin elevated at 6.31 consistent with NSTEMI.   SUBJECTIVE: Feels well, no chest pain or SOB.  Lying in bed   OBJECTIVE Filed Vitals:   06/29/15 2115 06/29/15 2229 06/30/15 0505 06/30/15 1203  BP: 95/51  98/49 106/59  Pulse: 83  74 78  Temp: 98.4 F (36.9 C)  97.9 F (36.6 C) 98.2 F (36.8 C)  TempSrc: Oral  Oral Oral  Resp: 16  17 17   Height:      Weight: 193 lb 12.8 oz (87.907 kg)  194 lb 6.4 oz (88.179 kg)   SpO2: 100% 100% 100% 100%    Intake/Output Summary (Last 24 hours) at 06/30/15 1223 Last data filed at 06/30/15 1039  Gross per 24 hour  Intake 890.73 ml  Output   1125 ml  Net -234.27 ml   Filed Weights   06/29/15 0416 06/29/15 2115 06/30/15 0505  Weight: 194 lb (87.998 kg) 193 lb 12.8 oz (87.907 kg) 194 lb 6.4 oz (88.179 kg)    PHYSICAL EXAM General: Well developed, well nourished, female in no acute distress. Head: Normocephalic, atraumatic.  Neck: Supple without bruits, No JVD. Lungs:  Resp regular and unlabored, CTA. Heart: RRR, S1, S2, no S3, S4, or murmur; no rub. Abdomen: Soft, non-tender, non-distended,  BS + x 4.  Extremities: No clubbing, cyanosis, No edema.  Neuro: Alert and oriented X 3. Moves all extremities spontaneously. Psych: Normal affect.  LABS: CBC:  Recent Labs  06/28/15 0305 06/29/15 0235  WBC 7.5 9.6  HGB 10.5* 9.9*  HCT 33.1* 31.4*  MCV 89.7 88.0  PLT 138* 144*   INR:  Recent Labs  06/29/15 0235  INR [id number]*   Basic Metabolic Panel:  Recent Labs  06/28/15 0305 06/29/15 0235  NA 136 135  K 4.1 3.6  CL 98* 96*  CO2 27 28  GLUCOSE 248* 270*  BUN 33* 42*  CREATININE 1.65* 1.90*  CALCIUM 8.8* 8.9   Cardiac Enzymes:  Recent Labs  06/29/15 0052 06/29/15 1158 06/29/15 1753  TROPONINI 3.81* 5.13* 6.82*    Current facility-administered medications:  .  acetaminophen (TYLENOL) tablet 500 mg, 500 mg, Oral, Q6H PRN, Ripudeep K Rai, MD .  albuterol (PROVENTIL) (2.5 MG/3ML) 0.083% nebulizer solution 3 mL, 3 mL, Inhalation, Q6H PRN, Ripudeep K Rai, MD, 3 mL at 06/27/15 0412 .  aspirin chewable tablet 81 mg, 81 mg, Oral, Daily, [employer], PA-C, 81 mg at 06/30/15 1031 .  atorvastatin (LIPITOR) tablet 20 mg, 20 mg, Oral, q1800, Skeet Latch, MD, 20 mg at 06/29/15 1728 .  atropine 1 % ophthalmic solution 1 drop, 1 drop, Both Eyes, Daily PRN, Ripudeep Krystal Eaton, MD, 1  drop at 06/30/15 1043 .  bifidobacterium infantis (ALIGN) capsule 1 capsule, 1 capsule, Oral, Daily, Ripudeep K Rai, MD, 1 capsule at 06/30/15 1032 .  cholecalciferol (VITAMIN D) tablet 1,000 Units, 1,000 Units, Oral, Daily, Ripudeep K Rai, MD, 1,000 Units at 06/30/15 1031 .  donepezil (ARICEPT) tablet 5 mg, 5 mg, Oral, QHS, Ripudeep K Rai, MD, 5 mg at [id number] .  folic acid (FOLVITE) tablet 1 mg, 1 mg, Oral, Daily, Ripudeep K Rai, MD, 1 mg at 06/30/15 1031 .  furosemide (LASIX) tablet 40 mg, 40 mg, Oral, BID, Ripudeep K Rai, MD, 40 mg at 06/30/15 0819 .  gabapentin (NEURONTIN) capsule 100 mg, 100 mg, Oral, BID, Ripudeep K Rai, MD, 100 mg at 06/30/15 1031 .  gi cocktail  (Maalox,Lidocaine,Donnatal), 30 mL, Oral, QID PRN, Ripudeep K Rai, MD, 30 mL at 06/27/15 0412 .  insulin aspart (novoLOG) injection 0-15 Units, 0-15 Units, Subcutaneous, TID WC, Ripudeep K Rai, MD, 3 Units at 06/30/15 1215 .  insulin aspart (novoLOG) injection 0-5 Units, 0-5 Units, Subcutaneous, QHS, Ripudeep K Rai, MD, 2 Units at 06/28/15 2116 .  levothyroxine (SYNTHROID, LEVOTHROID) tablet 50 mcg, 50 mcg, Oral, QAC breakfast, Ripudeep K Rai, MD, 50 mcg at 06/30/15 0650 .  loratadine (CLARITIN) tablet 10 mg, 10 mg, Oral, Daily, Ripudeep K Rai, MD, 10 mg at 06/30/15 1031 .  meclizine (ANTIVERT) tablet 25 mg, 25 mg, Oral, TID PRN, Ripudeep K Rai, MD .  nitroGLYCERIN (NITRODUR - Dosed in mg/24 hr) patch 0.2 mg, 0.2 mg, Transdermal, Daily, Doreene Burke Kilroy, PA-C, 0.2 mg at 06/30/15 1032 .  ondansetron (ZOFRAN) injection 4 mg, 4 mg, Intravenous, Q6H PRN, Ripudeep K Rai, MD .  pantoprazole (PROTONIX) EC tablet 40 mg, 40 mg, Oral, Daily, Ripudeep K Rai, MD, 40 mg at 06/30/15 1031 .  polyethylene glycol (MIRALAX / GLYCOLAX) packet 17 g, 17 g, Oral, Daily PRN, Ripudeep K Rai, MD .  ranolazine ([name]) 12 hr tablet 500 mg, 500 mg, Oral, BID, Ripudeep K Rai, MD, 500 mg at 06/30/15 1031 .  senna (SENOKOT) tablet 8.6 mg, 1 tablet, Oral, BID, Ripudeep K Rai, MD, 8.6 mg at 06/30/15 1030 .  traMADol (ULTRAM) tablet 50 mg, 50 mg, Oral, Q8H PRN, Ripudeep K Rai, MD, 50 mg at 06/30/15 1042 .  zolpidem (AMBIEN) tablet 5 mg, 5 mg, Oral, QHS PRN, Ripudeep K Rai, MD, 5 mg at 06/29/15 2145   TELE: NSR        ECG: NSR    Current Medications:  . aspirin  81 mg Oral Daily  . atorvastatin  20 mg Oral q1800  . bifidobacterium infantis  1 capsule Oral Daily  . cholecalciferol  1,000 Units Oral Daily  . donepezil  5 mg Oral QHS  . folic acid  1 mg Oral Daily  . furosemide  40 mg Oral BID  . gabapentin  100 mg Oral BID  . insulin aspart  0-15 Units Subcutaneous TID WC  . insulin aspart  0-5 Units Subcutaneous QHS  .  levothyroxine  50 mcg Oral QAC breakfast  . loratadine  10 mg Oral Daily  . nitroGLYCERIN  0.2 mg Transdermal Daily  . pantoprazole  40 mg Oral Daily  . ranolazine  500 mg Oral BID  . senna  1 tablet Oral BID      ASSESSMENT AND PLAN: Principal Problem:   Chest pain Active Problems:   Coronary artery disease, CABG in 1994. Myoview abnormal Aug 2015- medical Rx   Pacemaker  Hyperkalemia   Diabetes mellitus type 2, uncontrolled (HCC)   Hypothyroidism   PAF (paroxysmal atrial fibrillation) (HCC)   Complete heart block (HCC)   CKD (chronic kidney disease), stage III   Elevated troponin I level   Acute on chronic systolic and diastolic heart failure, NYHA class 1 ([place])  1. Elevated troponin: NSTEMI  Patient's troponin elevated this am. EKG is consistent with paced rhythm. Her troponin elevation is consistent with possible infarct, however patient is not a candidate for a heart catheterization. It is best that she be managed medically given her CKD.  Will continue ASA. She is on [name] and transdermal NTG, continue same. She is chest pain free.   She is breathing well. Not having any chest pain   I think she should be able to go soon.   She needs to get up and around before she goes - otherwise she should go to a SNF.  Talked with daughter and patient. She does not want to be intubated or have ventilation.  They will discuss with the others in the family    2. Acute on chronic combined diastolic and systolic CHF: LVEF [id number], grade 2 diastolic dysfunction.  BP and HR are well controlled.   3. History of 2nd degree AVB/complete heart block status post [employer] pacemaker placement: had generator change out on 06/22/15. Site looks stable, no sign of infection.   4. DM: management per primary team.   No active cardiac issues. Will sign off. Call for questions   Mertie Moores, MD  06/30/2015 12:25 PM    [facility] [place],  [place], [place]  [id number] Pager [phone] Phone: [phone]; Fax: [phone]

## 2015-06-30 NOTE — Progress Notes (Signed)
         [facility]                                                                              Patient Demographics  [name], is a 82 y.o. female, DOB - 09/27/1920, MRN:[id number]  Admit date - 06/24/2015   Admitting Physician Ripudeep K Rai, MD  Outpatient Primary MD for the patient is [name] L Dean, MD  Outpatient specialists:   LOS -   days    Chief Complaint  Patient presents with  . Pacemaker Problem       Brief summary   Patient is a 82-year-old female with history of complete heart block status post pacemaker with recent dual-chamber generator change out on 6/6, hypogonadism, hypertension, hyperlipidemia, diabetes mellitus, CAD, chronic kidney disease stage III presented to ED with episode of chest pain today. History was obtained from her multiple family members in her room. Per family members, they were concerned that her heart rate was in's 80s and prior to the pacemaker generator change it always had been in 60s. This morning, patient complained of chest discomfort first in the area of the pacemaker then radiating to the left chest and left arm. Patient was not able to qualify the chest pain. The episode was also associated with shortness of breath, patient was noticed to be clammy and diaphoretic by the family members. The whole episode lasted for 30-40 minutes and spontaneously resolved. First set of troponin mildly positive.   Assessment & Plan    Atypical Chest pain/NSTEMI: Has a history of CAD, CABG in 1994. Myoview abnormal Aug 2015- medical Rx -  2-D echocardiogram with EF 45-50%, grade 2 diastolic dysfunction. Prior echo 8/16 had shown EF of 30-35% with grade 1 diastolic dysfunction - Serial cardiac enzymes showed troponin is trending up, elevated at 6.31, However medical management recommended by cardiology, patient's family had also declined cardiac cath and not a candidate for further invasive evaluation given her age and comorbidities..     Active Problems: Acute on chronic combined systolic and diastolic CHF:  - cardiology following, patient was placed on IV Lasix -Creatinine trending up due to diuresis, cardiology recommended to transition to oral Lasix  Left knee pain, foot pain, leg pain with underlying history of neuropathy - Left knee x-ray showed small suprapatellar joint effusion and osteoarthritis - ESR, CRP elevated with uric acid 7.9, placed on colchicine and 3 doses of prednisone. No NSAIDs secondary to chronic kidney disease and CHF - Knee pain has improved   History of complete heart block with Pacemaker - Recent generator change out, cardiology following   Diabetes mellitus type 2, uncontrolled (HCC) - Placed on sliding scale insulin, Add meal coverage   Hypothyroidism - Continue outpatient dose of Synthroid   PAF (paroxysmal atrial fibrillation) (HCC) - Not on any rate controlling medications, has pacemaker - On warfarin per pharmacy    CKD (chronic kidney disease), stage III - Baseline creatinine 1-1.5  GERD - Continue PPI  Generalized debility Had a long conversation with patient's daughter and other family members at the bedside, start PTOT evaluation, out of bed to chair, increase ambulation. Medical management is now recommended by   cardiology for NSTEMI and nothing further to add.  Palliative consult placed for goals of care, patient's family has difficulty grasping the management and unrealistic expectations.   Code Status: Full CODE STATUS  DVT Prophylaxis: Warfarin   Family Communication: Discussed in detail with the patient, all imaging results, lab results explained to the patient , patient's daughter   Disposition Plan: Hopefully DC in 24-48 hours  Time Spent in minutes   25 minutes  Procedures:  2-D echo  Consultants:   Cardiology  Antimicrobials:      Medications  Scheduled Meds: . aspirin  81 mg Oral Daily  . atorvastatin  20 mg Oral q1800  .  bifidobacterium infantis  1 capsule Oral Daily  . cholecalciferol  1,000 Units Oral Daily  . donepezil  5 mg Oral QHS  . folic acid  1 mg Oral Daily  . furosemide  40 mg Oral BID  . gabapentin  100 mg Oral BID  . insulin aspart  0-15 Units Subcutaneous TID WC  . insulin aspart  0-5 Units Subcutaneous QHS  . levothyroxine  50 mcg Oral QAC breakfast  . loratadine  10 mg Oral Daily  . nitroGLYCERIN  0.2 mg Transdermal Daily  . pantoprazole  40 mg Oral Daily  . ranolazine  500 mg Oral BID  . senna  1 tablet Oral BID   Continuous Infusions:   PRN Meds:.acetaminophen, albuterol, atropine, gi cocktail, meclizine, ondansetron (ZOFRAN) IV, polyethylene glycol, traMADol, zolpidem   Antibiotics   Anti-infectives    None        Subjective:   [name] was seen and examined today. Patient declines any complaints, no wheezing. Daughter at the bedside. Multiple questions and concerns answered.  Patient denies dizziness, abdominal pain, N/V/D/C, new weakness, numbess, tingling. No fevers or chills, no acute events overnight.  Objective:   Filed Vitals:   06/29/15 1517 06/29/15 2115 06/29/15 2229 06/30/15 0505  BP:  95/51  98/49  Pulse:  83  74  Temp:  98.4 F (36.9 C)  97.9 F (36.6 C)  TempSrc:  Oral  Oral  Resp:  16  17  Height:      Weight:  87.907 kg (193 lb 12.8 oz)  88.179 kg (194 lb 6.4 oz)  SpO2: 100% 100% 100% 100%    Intake/Output Summary (Last 24 hours) at 06/30/15 1125 Last data filed at 06/30/15 1039  Gross per 24 hour  Intake 890.73 ml  Output   1125 ml  Net -234.27 ml     Wt Readings from Last 3 Encounters:  06/30/15 88.179 kg (194 lb 6.4 oz)  06/22/15 84.369 kg (186 lb)  03/30/15 84.7 kg (186 lb 11.7 oz)     Exam  General: Alert and oriented 3 NAD  HEENT:    Neck: Supple, no JVD  Cardiovascular: S1 S2clear, RRR   Respiratory:Fairly clear to auscultation bilaterally   Gastrointestinal: Soft, NT, ND, NBS  Ext: no cyanosis clubbing  or edema  Neuro: no new deficits  Skin: No rashes  Psych: Normal affect and demeanor, alert and oriented x3    Data Reviewed:  I have personally reviewed following labs and imaging studies  Micro Results Recent Results (from the past 240 hour(s))  Surgical pcr screen     Status: Abnormal   Collection Time: 06/22/15  1:56 PM  Result Value Ref Range Status   MRSA, PCR NEGATIVE NEGATIVE Final   Staphylococcus aureus POSITIVE (A) NEGATIVE Final    Comment:          The Xpert SA Assay (FDA approved for NASAL specimens in patients over 92 years of age), is one component of a comprehensive surveillance program.  Test performance has been validated by [facility] for patients greater than or equal to 71 year old. It is not intended to diagnose infection nor to guide or monitor treatment.     Radiology Reports Dg Chest Port 1 View  06/27/2015  CLINICAL DATA:  Acute onset of dyspnea.  Initial encounter. EXAM: PORTABLE CHEST 1 VIEW COMPARISON:  Chest radiograph performed 03/29/2015 FINDINGS: The lungs are well-aerated. Hazy bilateral airspace opacification may reflect pneumonia or pulmonary edema. Underlying vascular congestion is noted. No pleural effusion or pneumothorax is seen. The cardiomediastinal silhouette is mildly enlarged. The patient is status post median sternotomy, with evidence of prior CABG. A pacemaker is noted overlying the left chest wall, with leads ending overlying the right atrium and right ventricle. No acute osseous abnormalities are seen. IMPRESSION: 1. Hazy bilateral airspace opacification may reflect pneumonia or pulmonary edema. 2. Underlying vascular congestion and mild cardiomegaly. Electronically Signed   By: Garald Balding M.D.   On: 06/27/2015 06:09   Dg Knee Complete 4 Views Left  06/26/2015  CLINICAL DATA:  Pt having left knee pain. Pt states pain is chronic, no injury. Pain is radiating down into her lower leg. EXAM: LEFT KNEE - COMPLETE 4+ VIEW  COMPARISON:  None. FINDINGS: Vascular calcifications. Moderate 3 compartment osteoarthritis, with joint space narrowing and osteophyte formation. Small suprapatellar joint effusion. IMPRESSION: Osteoarthritis with small suprapatellar joint effusion. Electronically Signed   By: Abigail Miyamoto M.D.   On: 06/26/2015 11:42    Lab Data:  CBC:  Recent Labs Lab 06/24/15 1600 06/25/15 0906 06/27/15 0245 06/28/15 0305 06/29/15 0235  WBC 6.9 5.9 4.9 7.5 9.6  NEUTROABS 4.9  --   --   --   --   HGB 10.7* 10.5* 11.9* 10.5* 9.9*  HCT 34.3* 33.7* 37.5 33.1* 31.4*  MCV 91.7 89.9 89.1 89.7 88.0  PLT 110* 128* 138* 138* 016*   Basic Metabolic Panel:  Recent Labs Lab 06/24/15 1600 06/25/15 0906 06/27/15 0245 06/28/15 0305 06/29/15 0235  NA 135 139 136 136 135  K 5.6* 4.0 3.7 4.1 3.6  CL 103 102 98* 98* 96*  CO2 _0 GLUCOSE 190* 141* 266* 248* 270*  BUN _1 33* 42*  CREATININE 1.24* 1.18* 1.24* 1.65* 1.90*  CALCIUM 8.7* 9.1 9.2 8.8* 8.9  MG 2.0  --   --   --   --    GFR: Estimated Creatinine Clearance: 19.9 mL/min (by C-G formula based on Cr of 1.9). Liver Function Tests: No results for input(s): AST, ALT, ALKPHOS, BILITOT, PROT, ALBUMIN in the last 168 hours. No results for input(s): LIPASE, AMYLASE in the last 168 hours. No results for input(s): AMMONIA in the last 168 hours. Coagulation Profile:  Recent Labs Lab 06/25/15 0906 06/26/15 0255 06/27/15 0245 06/28/15 0305 06/29/15 0235  INR 2.05* 2.25* 1.86* 2.01* 2.12*   Cardiac Enzymes:  Recent Labs Lab 06/27/15 1714 06/28/15 0025 06/29/15 0052 06/29/15 1158 06/29/15 1753  TROPONINI 4.20* 6.31* 3.81* 5.13* 6.82*   BNP (last 3 results) No results for input(s): PROBNP in the last 8760 hours. HbA1C: No results for input(s): HGBA1C in the last 72 hours. CBG:  Recent Labs Lab 06/29/15 1131 06/29/15 1653 06/29/15 1807 06/29/15 2116 06/30/15 0540  GLUCAP 141* 63* 104* 133* 111*   Lipid  Profile: No results for input(s):  CHOL, HDL, LDLCALC, TRIG, CHOLHDL, LDLDIRECT in the last 72 hours. Thyroid Function Tests: No results for input(s): TSH, T4TOTAL, FREET4, T3FREE, THYROIDAB in the last 72 hours. Anemia Panel: No results for input(s): VITAMINB12, FOLATE, FERRITIN, TIBC, IRON, RETICCTPCT in the last 72 hours. Urine analysis:    Component Value Date/Time   COLORURINE AMBER* 03/29/2015 2212   APPEARANCEUR TURBID* 03/29/2015 2212   LABSPEC 1.015 03/29/2015 2212   PHURINE 6.5 03/29/2015 2212   GLUCOSEU NEGATIVE 03/29/2015 2212   GLUCOSEU NEGATIVE 04/01/2013 1550   HGBUR MODERATE* 03/29/2015 2212   BILIRUBINUR NEGATIVE 03/29/2015 2212   KETONESUR NEGATIVE 03/29/2015 2212   PROTEINUR NEGATIVE 03/29/2015 2212   UROBILINOGEN 1.0 08/16/2014 1521   NITRITE NEGATIVE 03/29/2015 2212   LEUKOCYTESUR LARGE* 03/29/2015 2212     RAI,RIPUDEEP M.D. [facility] 06/30/2015, 11:25 AM  Pager: [phone] Between 7am to 7pm - call Pager - [phone]  After 7pm go to [email or web address] - password [email or web address]  Call night coverage person covering after 7pm

## 2015-06-30 NOTE — Progress Notes (Signed)
Physical Therapy Treatment Patient Details Name: [name] MRN: [id number] DOB: May 17, 1920 Today's Date: 06/30/2015    History of Present Illness Pt is a 82 y/o F who presented w/ SOB and chest discomfort. Admitting dx: acute on chronic combined heart failure.  Pt's PMH includes CABG, legally blind, CKF stage III, DVT Bil UE, pacemaker.    PT Comments    Ms. Menning made progress today.  She required mod assist for bed mobility and stand pivot transfer to chair. Demonstrated and discussed technique for safe mobility w/ daughter who was present during session. Family remains prepared for pt to return home at d/c w/ continuation of HHPT.  Pt will benefit from continued skilled PT services to increase functional independence and safety.    Follow Up Recommendations  Home health PT;Supervision/Assistance - 24 hour     Equipment Recommendations  None recommended by PT    Recommendations for Other Services OT consult     Precautions / Restrictions Precautions Precautions: Fall Precaution Comments: legally blind Restrictions Weight Bearing Restrictions: No    Mobility  Bed Mobility Overal bed mobility: Needs Assistance Bed Mobility: Supine to Sit     Supine to sit: Mod assist;HOB elevated     General bed mobility comments: Pt uses bed rail and requires assist to elevate trunk and managing LEs.  Bed pad used to assist pt to scoot to EOB.   Transfers Overall transfer level: Needs assistance Equipment used: 1 person hand held assist Transfers: Sit to/from [facility] Sit to Stand: Mod assist Stand pivot transfers: Mod assist       General transfer comment: Cues for technique and pt requires assist to boost and to steady.  Once stand initiated pt wraps arms around therapists neck.  Pt moves feet to pivot.  Ambulation/Gait             General Gait Details: deferred for pt/therapist safety   Stairs            Wheelchair Mobility     Modified Rankin (Stroke Patients Only)       Balance Overall balance assessment: Needs assistance Sitting-balance support: Single extremity supported;Feet supported Sitting balance-Leahy Scale: Poor Sitting balance - Comments: UE support while sitting EOB                            Cognition Arousal/Alertness: Awake/alert Behavior During Therapy: WFL for tasks assessed/performed Overall Cognitive Status: Within Functional Limits for tasks assessed                      Exercises General Exercises - Lower Extremity Ankle Circles/Pumps: AROM;Both;10 reps;Supine Quad Sets: Strengthening;Both;10 reps;Supine Long Arc Quad: AROM;Both;10 reps;Seated    General Comments General comments (skin integrity, edema, etc.): Demonstrated and discussed technique for safe mobility w/ daughter who was present during session.      Pertinent Vitals/Pain Pain Assessment: Faces Faces Pain Scale: Hurts little more Pain Location: Lt hip (h/o arthritis per family) Pain Descriptors / Indicators: Aching Pain Intervention(s): Limited activity within patient's tolerance;Monitored during session;Repositioned    Home Living                      Prior Function            PT Goals (current goals can now be found in the care plan section) Acute Rehab PT Goals Patient Stated Goal: per family, to return home once medically stable  PT Goal Formulation: With patient/family Time For Goal Achievement: 07/11/15 Potential to Achieve Goals: Fair Progress towards PT goals: Progressing toward goals    Frequency  Min 3X/week    PT Plan Current plan remains appropriate    Co-evaluation             End of Session Equipment Utilized During Treatment: Oxygen;Gait belt Activity Tolerance: Patient limited by fatigue Patient left: with call bell/phone within reach;with family/visitor present;in chair;with chair alarm set     Time: [id number] PT Time Calculation (min)  (ACUTE ONLY): 21 min  Charges:  $Therapeutic Activity: 8-22 mins                    G Codes:       [name] PT, DPT  Pager: [phone] Phone: [phone] 06/30/2015, 3:00 PM

## 2015-06-30 NOTE — Progress Notes (Signed)
Pt a/forgetful, no c/o pain, VSS, pt repositioned, pt resting comfortably, pt stable

## 2015-06-30 NOTE — Progress Notes (Signed)
Family asked that I assess slightly blood-tinged sputum.  MD notified.

## 2015-06-30 NOTE — Consult Note (Signed)
Consultation Note Date: 06/30/2015   Patient Name: [name]  DOB: 1920/04/16  MRN: [id number]  Age / Sex: 82 y.o., female  PCP: [name], MD Referring Physician: Mendel Corning, MD  Reason for Consultation: Establishing goals of care and Psychosocial/spiritual support  HPI/Patient Profile: 82 y.o. female   admitted on 06/24/2015 PMH  with chronic systolic and diastolic heart failure LVEF 45-50%, medically managed CAD, hypertension, and hyperlipidemia admitted with chest pain. She lives at home in the care of her family.   Clinical Assessment and Goals of Care:  This NP Wadie Lessen reviewed medical records, received report from team, assessed the patient and then meet at the patient's bedside along with   to discuss diagnosis, prognosis, GOC, EOL wishes disposition and options.   A detailed discussion was had today regarding advanced directives.  Concepts specific to code status, artifical feeding and hydration, continued IV antibiotics and rehospitalization was had.  The difference between a aggressive medical intervention path  and a palliative comfort care path for this patient at this time was had.  Values and goals of care important to patient and family were attempted to be elicited.  Concept of Hospice and Palliative Care were discussed   MOST form introduced   Educated and offered hospice services as an option once discharged home.  At this time family wishes to continue with home health as prior to [facility]   Questions and concerns addressed.  Hard Choices booklet left for review. Family encouraged to call with questions or concerns.  PMT will continue to support holistically.   HCPOA    SUMMARY OF RECOMMENDATIONS    - limited code , no desire for intubation now or in the future - continued medical management of chronic disease - dc home with home health  Code Status/Advance  Care Planning:    Limited code   Palliative Prophylaxis:   Bowel Regimen, Frequent Pain Assessment and Oral Care   Psycho-social/Spiritual:   Desire for further Chaplaincy support:yes  Additional Recommendations: Education on Hospice  Prognosis:    < 6 months  Discharge Planning: Home with Home Health      Primary Diagnoses: Present on Admission:  . Pacemaker . PAF (paroxysmal atrial fibrillation) ([place]) . Hyperkalemia . Hypothyroidism . Coronary artery disease, CABG in 1994. Myoview abnormal Aug 2015- medical Rx . Diabetes mellitus type 2, uncontrolled ([place]) . CKD (chronic kidney disease), stage III . Complete heart block ([place]) . Chest pain . Elevated troponin I level  I have reviewed the medical record, interviewed the patient and family, and examined the patient. The following aspects are pertinent.  Past Medical History  Diagnosis Date  . Hypertension   . Legally blind   . CKD (chronic kidney disease), stage III     a. iii - iv.  . Anemia   . Hypothyroid   . Gastric ulcer   . Hiatal hernia   . Dyspepsia   . UTI (lower urinary tract infection)   . H/O: GI bleed 12//13  .  High cholesterol   . Chronic combined systolic and diastolic CHF (congestive heart failure) ([place])     a. 01/2014 Echo: EF @ least mod-sev reduced with HK of lat/apical, basal inf walls. basalpost AK, Gr 1DD.  Marland Kitchen DVT of upper extremity (deep vein thrombosis) ([place]) 06/13/2012    BUE  . Seasonal allergies   . Allergy to perfume   . Type II diabetes mellitus ([place])   . History of blood transfusion 2013  . GERD (gastroesophageal reflux disease)   . Daily headache   . Arthritis   . PAF (paroxysmal atrial fibrillation) ([facility])     a. CHA2DS2VASc = 7-->coumadin.  . 2Nd degree atrioventricular block     a. s/p MDT ADDR01 DC PPM (ser #: [id number]).  . Coronary artery disease     a. 1994 s/p cabg;  b. 09/2013 MV: large, sev intensity, partially reversible inf, apical defect, prior inf/ap  infarct w/ mild peri-infarct ischemia->Med Rx.  . Presence of permanent cardiac pacemaker    Social History   Social History  . Marital Status: Widowed    Spouse Name: N/A  . Number of Children: 10  . Years of Education: N/A   Occupational History  .     Social History Main Topics  . Smoking status: Never Smoker   . Smokeless tobacco: Never Used  . Alcohol Use: No  . Drug Use: No  . Sexual Activity: No   Other Topics Concern  . None   Social History Narrative   Family History  Problem Relation Age of Onset  . Breast cancer Daughter   . Diabetes Son   . Diabetes Daughter   . Heart disease Father   . Diabetes Father   . Colon polyps Daughter   . Heart attack Brother   . Diabetes Brother   . Stroke Sister   . Colon cancer Neg Hx    Scheduled Meds: . aspirin  81 mg Oral Daily  . atorvastatin  20 mg Oral q1800  . bifidobacterium infantis  1 capsule Oral Daily  . cholecalciferol  1,000 Units Oral Daily  . donepezil  5 mg Oral QHS  . folic acid  1 mg Oral Daily  . furosemide  40 mg Oral BID  . gabapentin  100 mg Oral BID  . insulin aspart  0-15 Units Subcutaneous TID WC  . insulin aspart  0-5 Units Subcutaneous QHS  . levothyroxine  50 mcg Oral QAC breakfast  . loratadine  10 mg Oral Daily  . nitroGLYCERIN  0.2 mg Transdermal Daily  . pantoprazole  40 mg Oral Daily  . ranolazine  500 mg Oral BID  . senna  1 tablet Oral BID   Continuous Infusions:  PRN Meds:.acetaminophen, albuterol, atropine, gi cocktail, meclizine, ondansetron (ZOFRAN) IV, polyethylene glycol, traMADol, zolpidem Medications Prior to Admission:  Prior to Admission medications   Medication Sig Start Date End Date Taking? Authorizing Provider  acetaminophen (TYLENOL) 500 MG tablet Take 500 mg by mouth every 6 (six) hours as needed (pain).   Yes Historical Provider, MD  albuterol (PROAIR HFA) 108 (90 Base) MCG/ACT inhaler Inhale 2 puffs into the lungs every 6 (six) hours as needed for wheezing or  shortness of breath.   Yes Historical Provider, MD  aspirin EC 81 MG tablet Take 81 mg by mouth daily.   Yes Historical Provider, MD  atropine 1 % ophthalmic solution Place 1 drop into both eyes daily as needed (burning eyes).  10/29/13  Yes Historical Provider, MD  cholecalciferol (VITAMIN D) 1000 units tablet Take 1,000 Units by mouth daily.   Yes Historical Provider, MD  donepezil (ARICEPT) 5 MG tablet Take 5 mg by mouth at bedtime.  07/13/14  Yes Historical Provider, MD  folic acid (FOLVITE) 1 MG tablet take 1 tablet by mouth once daily 05/19/15  Yes Mihai Croitoru, MD  furosemide (LASIX) 40 MG tablet Take 1 tablet (40 mg total) by mouth daily. 06/16/15  Yes Mihai Croitoru, MD  gabapentin (NEURONTIN) 100 MG capsule Take 100 mg by mouth 2 (two) times daily.    Yes Historical Provider, MD  glipiZIDE (GLUCOTROL) 5 MG tablet TAKE ONE-HALF TABLET BY  MOUTH DAILY ONLY IF BLOOD  SUGAR IS OVER 200 Patient taking differently: TAKE ONE-HALF TABLET BY  MOUTH DAILY ONLY IF BLOOD  SUGAR IS 300 OR MORE 12/14/14  Yes Elayne Snare, MD  loratadine (CLARITIN) 10 MG tablet Take 10 mg by mouth daily.   Yes Historical Provider, MD  meclizine (ANTIVERT) 25 MG tablet Take 25 mg by mouth 3 (three) times daily as needed for dizziness.   Yes Historical Provider, MD  nitroGLYCERIN (NITRODUR - DOSED IN MG/24 HR) 0.2 mg/hr patch Place 1 patch (0.2 mg total) onto the skin daily. 12/21/14  Yes Mihai Croitoru, MD  omeprazole (PRILOSEC) 20 MG capsule Take 20 mg by mouth daily before breakfast.    Yes Historical Provider, MD  OVER THE COUNTER MEDICATION Take 1 Can by mouth daily. Enterex nutritional supplement - with choline   Yes Historical Provider, MD  OVER THE COUNTER MEDICATION Take 1 tablet by mouth 2 (two) times daily with a meal. FD Guard   Yes Historical Provider, MD  OVER THE COUNTER MEDICATION Take 1 tablet by mouth daily. Micro-algae DHA   Yes Historical Provider, MD  OVER THE COUNTER MEDICATION Take 1 capsule by mouth  daily. Choline 100 mg, trimethylglycine 50 mg, glutathione 50 mg, methyltetrahydrofolate, l-arginine-, l-ornithine, l-citruline   Yes Historical Provider, MD  Polyvinyl Alcohol-Povidone (REFRESH OP) Place 1 drop into both eyes daily as needed (dry eyes).    Yes Historical Provider, MD  potassium chloride (K-DUR) 10 MEQ tablet take 1 tablet by mouth twice a day 06/16/15  Yes Mihai Croitoru, MD  ranolazine ([name]) 500 MG 12 hr tablet Take 1 tablet (500 mg total) by mouth 2 (two) times daily. 05/12/15  Yes Mihai Croitoru, MD  SYNTHROID 50 MCG tablet take 1 tablet by mouth once daily BEFORE BREAKFAST 02/19/15  Yes Elayne Snare, MD  traMADol (ULTRAM) 50 MG tablet Take 1 tablet (50 mg total) by mouth every 6 (six) hours as needed for moderate pain. Patient taking differently: Take 50 mg by mouth 2 (two) times daily as needed for moderate pain.  08/22/14  Yes Orson Eva, MD  warfarin (COUMADIN) 3 MG tablet Take 1 to 1.5 tablets by mouth daily or as directed by coumadin clinic Patient taking differently: Take 3-4.5 mg by mouth daily after supper. Take 1 and 1/2 tablets (4.5 mg) on Monday, Wednesday and Friday, take 1 tablet (3 mg) on Sunday, Tuesday, Thursday, Saturday 03/08/15  Yes Mihai Croitoru, MD  zolpidem (AMBIEN) 5 MG tablet Take 0.5 tablets (2.5 mg total) by mouth at bedtime. sleep Patient taking differently: Take 5 mg by mouth. sleep 08/22/14  Yes Orson Eva, MD  Blood Glucose Monitoring Suppl (ONE TOUCH ULTRA 2) w/Device KIT Use to check blood sugar 2 times per day dx code E11.65 02/23/15   Elayne Snare, MD  glucose blood (ONE TOUCH ULTRA TEST)  test strip Use as instructed to check blood sugar 2 times per day dx code E11.65 02/23/15   Elayne Snare, MD  guaifenesin (MUCUS RELIEF CHEST CONGESTION) 400 MG TABS tablet Take 400 mg by mouth 2 (two) times daily as needed (cough/ congestion).    Historical Provider, MD   Allergies  Allergen Reactions  . Darvon Other (See Comments)    confusion  . Digoxin And Related Nausea  Only  . Penicillins Other (See Comments)    Was told had allergy from childhood... Unknown reaction  . Percocet [Oxycodone-Acetaminophen] Other (See Comments)    confusion  . Percodan [Oxycodone-Aspirin] Other (See Comments)    confusion  . Vicodin [Hydrocodone-Acetaminophen] Other (See Comments)    confusion   Review of Systems  Constitutional: Positive for appetite change and fatigue.  Neurological: Positive for weakness.    Physical Exam  Constitutional: She is oriented to person, place, and time. She appears well-developed. She appears ill.  Cardiovascular: Normal rate, regular rhythm and normal heart sounds.   Pulmonary/Chest: She has decreased breath sounds in the right lower field and the left lower field.  Abdominal: Soft. Normal appearance and bowel sounds are normal.  Neurological: She is alert and oriented to person, place, and time.  Skin: Skin is warm and dry.    Vital Signs: BP 106/59 mmHg  Pulse 78  Temp(Src) 98.2 F (36.8 C) (Oral)  Resp 17  Ht '5\' 4"'$  (1.626 m)  Wt 88.179 kg (194 lb 6.4 oz)  BMI 33.35 kg/m2  SpO2 100% Pain Assessment: 0-10   Pain Score: Asleep   SpO2: SpO2: 100 % O2 Device:SpO2: 100 % O2 Flow Rate: .O2 Flow Rate (L/min): 1 L/min  IO: Intake/output summary:  Intake/Output Summary (Last 24 hours) at 06/30/15 1344 Last data filed at 06/30/15 1039  Gross per 24 hour  Intake 890.73 ml  Output   1125 ml  Net -234.27 ml    LBM: Last BM Date: 06/25/15 Baseline Weight: Weight: 87.952 kg (193 lb 14.4 oz) Most recent weight: Weight: 88.179 kg (194 lb 6.4 oz) (bed scale)     Palliative Assessment/Data:   30%   Discussed with Dr Tana Coast  Time In: 1415 Time Out: 1530 Time Total: 75 min Greater than 50%  of this time was spent counseling and coordinating care related to the above assessment and plan.  Signed by: Wadie Lessen, NP   Please contact Palliative Medicine Team phone at [phone] for questions and concerns.  For individual  provider: See Shea Evans

## 2015-07-01 ENCOUNTER — Ambulatory Visit: Payer: PPO

## 2015-07-01 ENCOUNTER — Telehealth: Payer: Self-pay | Admitting: Cardiovascular Disease

## 2015-07-01 LAB — BASIC METABOLIC PANEL
ANION GAP: 7 (ref 5–15)
BUN: 44 mg/dL — ABNORMAL HIGH (ref 6–20)
CHLORIDE: 98 mmol/L — AB (ref 101–111)
CO2: 32 mmol/L (ref 22–32)
CREATININE: 1.66 mg/dL — AB (ref 0.44–1.00)
Calcium: 8.7 mg/dL — ABNORMAL LOW (ref 8.9–10.3)
GFR calc non Af Amer: 25 mL/min — ABNORMAL LOW (ref >60)
GFR, EST AFRICAN AMERICAN: 30 mL/min — AB (ref >60)
Glucose, Bld: 129 mg/dL — ABNORMAL HIGH (ref 65–99)
POTASSIUM: 3.5 mmol/L (ref 3.5–5.1)
SODIUM: 137 mmol/L (ref 135–145)

## 2015-07-01 LAB — CBC
HEMATOCRIT: 30.8 % — AB (ref 36.0–46.0)
HEMOGLOBIN: 9.7 g/dL — AB (ref 12.0–15.0)
MCH: 28 pg (ref 26.0–34.0)
MCHC: 31.5 g/dL (ref 30.0–36.0)
MCV: 89 fL (ref 78.0–100.0)
Platelets: 140 10*3/uL — ABNORMAL LOW (ref 150–400)
RBC: 3.46 MIL/uL — AB (ref 3.87–5.11)
RDW: 14.5 % (ref 11.5–15.5)
WBC: 6.1 10*3/uL (ref 4.0–10.5)

## 2015-07-01 LAB — PROTIME-INR
INR: 1.57 — AB (ref 0.00–1.49)
PROTHROMBIN TIME: 18.9 s — AB (ref 11.6–15.2)

## 2015-07-01 LAB — GLUCOSE, CAPILLARY
GLUCOSE-CAPILLARY: 132 mg/dL — AB (ref 65–99)
GLUCOSE-CAPILLARY: 121 mg/dL — AB (ref 65–99)
Glucose-Capillary: 114 mg/dL — ABNORMAL HIGH (ref 65–99)

## 2015-07-01 MED ORDER — FUROSEMIDE 40 MG PO TABS: 40.0000 mg | ORAL_TABLET | Freq: Two times a day (BID) | ORAL | Status: DC

## 2015-07-01 MED ORDER — COUMADIN BOOK
Freq: Once | Status: AC
  Administered 2015-07-01: 13:00:00
  Filled 2015-07-01: qty 1

## 2015-07-01 MED ORDER — ATORVASTATIN CALCIUM 20 MG PO TABS: 20.0000 mg | ORAL_TABLET | Freq: Every day | ORAL | Status: DC

## 2015-07-01 MED ORDER — SENNOSIDES-DOCUSATE SODIUM 8.6-50 MG PO TABS: 1.0000 | ORAL_TABLET | Freq: Two times a day (BID) | ORAL | Status: DC

## 2015-07-01 NOTE — Progress Notes (Addendum)
CM received call that family was requesting scale and MD had authorized CM to write Rx. CM discussed this with pt and family at bedside. CM does not do prior authorizations. For this reason we deduced it would be in the best interest of the pt for the PCP and/or Cardiologist to write the Rx and perform the prior authorization in the out pt setting. Family agrees and will complete this from home after discharge. No further CM needs at this time.

## 2015-07-01 NOTE — Telephone Encounter (Signed)
New message      The pt is being discharged today from the hospital the daughter is calling to get MD to write out on a prescription for the insurance company the pt needs a scale big enough to weigh her in a wheelchair everyday so the daughters' can keep up with the fluid in take.

## 2015-07-01 NOTE — Discharge Summary (Signed)
Physician Discharge Summary   Patient ID: [name] MRN: [id number] DOB/AGE: August 03, 1920 82 y.o.  Admit date: 06/24/2015 Discharge date: 07/01/2015  Primary Care Physician:  [name], MD  Discharge Diagnoses:   . Atypical Chest pain . Elevated troponin I level/ NSTEMI . Chest pain . acute on chronic combined systolic and diastolic CHF  .   Left knee gout . Pacemaker . PAF (paroxysmal atrial fibrillation) ([place]) . Hypothyroidism . Coronary artery disease, CABG in 1994. Myoview abnormal Aug 2015- medical Rx . Diabetes mellitus type 2, uncontrolled ([place]) . CKD (chronic kidney disease), stage III . Complete heart block (HCC) Generalized debility    Consults:   Cardiology, Dr Acie Fredrickson Palliative medicine  Recommendations for Outpatient Follow-up:  1. Home health PT, OT, RN, home health aide to be arranged by the case management 2. Please repeat CBC/BMET at next visit   DIET: Heart healthy diet    Allergies:   Allergies  Allergen Reactions  . Darvon Other (See Comments)    confusion  . Digoxin And Related Nausea Only  . Penicillins Other (See Comments)    Was told had allergy from childhood... Unknown reaction  . Percocet [Oxycodone-Acetaminophen] Other (See Comments)    confusion  . Percodan [Oxycodone-Aspirin] Other (See Comments)    confusion  . Vicodin [Hydrocodone-Acetaminophen] Other (See Comments)    confusion     DISCHARGE MEDICATIONS: Current Discharge Medication List    START taking these medications   Details  atorvastatin (LIPITOR) 20 MG tablet Take 1 tablet (20 mg total) by mouth at bedtime. Qty: 30 tablet, Refills: 3    senna-docusate (SENOKOT S) 8.6-50 MG tablet Take 1 tablet by mouth 2 (two) times daily. For constipation Qty: 60 tablet, Refills: 1      CONTINUE these medications which have CHANGED   Details  furosemide (LASIX) 40 MG tablet Take 1 tablet (40 mg total) by mouth 2 (two) times daily. Qty: 60 tablet, Refills: 3       CONTINUE these medications which have NOT CHANGED   Details  acetaminophen (TYLENOL) 500 MG tablet Take 500 mg by mouth every 6 (six) hours as needed (pain).    albuterol (PROAIR HFA) 108 (90 Base) MCG/ACT inhaler Inhale 2 puffs into the lungs every 6 (six) hours as needed for wheezing or shortness of breath.    aspirin EC 81 MG tablet Take 81 mg by mouth daily.    atropine 1 % ophthalmic solution Place 1 drop into both eyes daily as needed (burning eyes).  Refills: 0    cholecalciferol (VITAMIN D) 1000 units tablet Take 1,000 Units by mouth daily.    donepezil (ARICEPT) 5 MG tablet Take 5 mg by mouth at bedtime.  Refills: 0    folic acid (FOLVITE) 1 MG tablet take 1 tablet by mouth once daily Qty: 30 tablet, Refills: 9    gabapentin (NEURONTIN) 100 MG capsule Take 100 mg by mouth 2 (two) times daily.     glipiZIDE (GLUCOTROL) 5 MG tablet TAKE ONE-HALF TABLET BY  MOUTH DAILY ONLY IF BLOOD  SUGAR IS OVER 200 Qty: 45 tablet, Refills: 1    loratadine (CLARITIN) 10 MG tablet Take 10 mg by mouth daily.    meclizine (ANTIVERT) 25 MG tablet Take 25 mg by mouth 3 (three) times daily as needed for dizziness.    nitroGLYCERIN (NITRODUR - DOSED IN MG/24 HR) 0.2 mg/hr patch Place 1 patch (0.2 mg total) onto the skin daily. Qty: 30 patch, Refills: 6  omeprazole (PRILOSEC) 20 MG capsule Take 20 mg by mouth daily before breakfast.     !! OVER THE COUNTER MEDICATION Take 1 Can by mouth daily. Enterex nutritional supplement - with choline    !! OVER THE COUNTER MEDICATION Take 1 tablet by mouth 2 (two) times daily with a meal. FD Guard    !! OVER THE COUNTER MEDICATION Take 1 tablet by mouth daily. Micro-algae DHA    !! OVER THE COUNTER MEDICATION Take 1 capsule by mouth daily. Choline 100 mg, trimethylglycine 50 mg, glutathione 50 mg, methyltetrahydrofolate, l-arginine-, l-ornithine, l-citruline    Polyvinyl Alcohol-Povidone (REFRESH OP) Place 1 drop into both eyes daily as needed (dry  eyes).     potassium chloride (K-DUR) 10 MEQ tablet take 1 tablet by mouth twice a day Qty: 60 tablet, Refills: 6    ranolazine ([name]) 500 MG 12 hr tablet Take 1 tablet (500 mg total) by mouth 2 (two) times daily. Qty: 180 tablet, Refills: 3   Associated Diagnoses: Coronary artery disease involving native coronary artery of native heart with angina pectoris ([place]); Complete heart block (HCC)    SYNTHROID 50 MCG tablet take 1 tablet by mouth once daily BEFORE BREAKFAST Qty: 30 tablet, Refills: 3    traMADol (ULTRAM) 50 MG tablet Take 1 tablet (50 mg total) by mouth every 6 (six) hours as needed for moderate pain. Qty: 30 tablet, Refills: 0    warfarin (COUMADIN) 3 MG tablet Take 1 to 1.5 tablets by mouth daily or as directed by coumadin clinic Qty: 40 tablet, Refills: 3    zolpidem (AMBIEN) 5 MG tablet Take 0.5 tablets (2.5 mg total) by mouth at bedtime. sleep Qty: 30 tablet, Refills: 0    Blood Glucose Monitoring Suppl (ONE TOUCH ULTRA 2) w/Device KIT Use to check blood sugar 2 times per day dx code E11.65 Qty: 1 each, Refills: 0    glucose blood (ONE TOUCH ULTRA TEST) test strip Use as instructed to check blood sugar 2 times per day dx code E11.65 Qty: 100 each, Refills: 2    guaifenesin (MUCUS RELIEF CHEST CONGESTION) 400 MG TABS tablet Take 400 mg by mouth 2 (two) times daily as needed (cough/ congestion).     !! - Potential duplicate medications found. Please discuss with provider.       Brief H and P: For complete details please refer to admission H and P, but in brief Patient is a 82 year old female with history of complete heart block status post pacemaker with recent dual-chamber generator change out on 6/6, hypogonadism, hypertension, hyperlipidemia, diabetes mellitus, CAD, chronic kidney disease stage III presented to ED with episode of chest pain today. History was obtained from her multiple family members in her room. Per family members, they were concerned that her  heart rate was in's 80s and prior to the pacemaker generator change it always had been in 52s. This morning, patient complained of chest discomfort first in the area of the pacemaker then radiating to the left chest and left arm. Patient was not able to qualify the chest pain. The episode was also associated with shortness of breath, patient was noticed to be clammy and diaphoretic by the family members. The whole episode lasted for 30-40 minutes and spontaneously resolved. First set of troponin mildly positive. Patient was admitted for further workup.  Hospital Course:   Atypical Chest pain/NSTEMI: Has a history of CAD, CABG in 1994. Myoview abnormal Aug 2015- medical Rx - 2-D echocardiogram showed EF 38-18%, grade 2 diastolic  dysfunction. Prior echo 8/16 had shown EF of 30-35% with grade 1 diastolic dysfunction - Serial cardiac enzymes showed troponin elevated at 6.31, patient was placed on heparin drip. Cardiology was consulted. Medical management recommended by cardiology, patient's family had also declined cardiac cath and she is not a candidate for further invasive evaluation given her age and comorbidities..   Acute on chronic combined systolic and diastolic CHF:  -patient was placed on IV Lasix -Creatinine trended up due to diuresis, cardiology recommended to transition to oral Lasix  Left knee pain, foot pain, leg pain with underlying history of neuropathy - Left knee x-ray showed small suprapatellar joint effusion and osteoarthritis - ESR, CRP elevated with uric acid 7.9, was placed on colchicine and 3 doses of prednisone. No NSAIDs secondary to chronic kidney disease and CHF - Knee pain has improved   History of complete heart block with Pacemaker - Recent generator change out   Diabetes mellitus type 2, uncontrolled ([place]) - Placed on sliding scale insulin, Add meal coverage   Hypothyroidism - Continue outpatient dose of Synthroid   PAF (paroxysmal atrial fibrillation)  (HCC) - Not on any rate controlling medications, has pacemaker -Patient may resume Coumadin, follow-up with cardiology outpatient    CKD (chronic kidney disease), stage III - Baseline creatinine 1-1.5  GERD - Continue PPI  Generalized debility The patient was also seen by palliative medicine. Overall multiple comorbidities, CHF, NSTEMI, and family had difficulty grasping the management and had unrealistic expectations. Medical management is recommended by cardiology for NSTEMI and nothing further to add. Per goals of care, patient has partial CODE STATUS otherwise family requested continue with the current management.    Day of Discharge BP 97/47 mmHg  Pulse 72  Temp(Src) 98.3 F (36.8 C) (Oral)  Resp 18  Ht '5\' 4"'$  (1.626 m)  Wt 88.587 kg (195 lb 4.8 oz)  BMI 33.51 kg/m2  SpO2 100%  Physical Exam: General: Alert and awake oriented x3 not in any acute distress. HEENT: anicteric sclera, pupils reactive to light and accommodation CVS: S1-S2 clear no murmur rubs or gallops Chest: clear to auscultation bilaterally, no wheezing rales or rhonchi Abdomen: soft nontender, nondistended, normal bowel sounds Extremities: no cyanosis, clubbing or edema noted bilaterally s   The results of significant diagnostics from this hospitalization (including imaging, microbiology, ancillary and laboratory) are listed below for reference.    LAB RESULTS: Basic Metabolic Panel:  Recent Labs Lab 06/24/15 1600  06/29/15 0235 07/01/15 0634  NA 135  < > 135 137  K 5.6*  < > 3.6 3.5  CL 103  < > 96* 98*  CO2 26  < > 28 32  GLUCOSE 190*  < > 270* 129*  BUN 18  < > 42* 44*  CREATININE 1.24*  < > 1.90* 1.66*  CALCIUM 8.7*  < > 8.9 8.7*  MG 2.0  --   --   --   < > = values in this interval not displayed. Liver Function Tests: No results for input(s): AST, ALT, ALKPHOS, BILITOT, PROT, ALBUMIN in the last 168 hours. No results for input(s): LIPASE, AMYLASE in the last 168 hours. No results for  input(s): AMMONIA in the last 168 hours. CBC:  Recent Labs Lab 06/24/15 1600  06/29/15 0235 07/01/15 0634  WBC 6.9  < > 9.6 6.1  NEUTROABS 4.9  --   --   --   HGB 10.7*  < > 9.9* 9.7*  HCT 34.3*  < > 31.4* 30.8*  MCV  91.7  < > 88.0 89.0  PLT 110*  < > 144* 140*  < > = values in this interval not displayed. Cardiac Enzymes:  Recent Labs Lab 06/29/15 1158 06/29/15 1753  TROPONINI 5.13* 6.82*   BNP: Invalid input(s): POCBNP CBG:  Recent Labs Lab 06/30/15 2135 07/01/15 0659  GLUCAP 135* 132*    Significant Diagnostic Studies:  No results found.  2D ECHO: Study Conclusions  - Left ventricle: The cavity size was normal. Wall thickness was  increased in a pattern of moderate LVH. Systolic function was  mildly reduced. The estimated ejection fraction was in the range  of 45% to 50%. Features are consistent with a pseudonormal left  ventricular filling pattern, with concomitant abnormal relaxation  and increased filling pressure (grade 2 diastolic dysfunction). - Aortic valve: Mildly calcified annulus. Mildly thickened, mildly  calcified leaflets.   Disposition and Follow-up: Discharge Instructions    (HEART FAILURE PATIENTS) Call MD:  Anytime you have any of the following symptoms: 1) 3 pound weight gain in 24 hours or 5 pounds in 1 week 2) shortness of breath, with or without a dry hacking cough 3) swelling in the hands, feet or stomach 4) if you have to sleep on extra pillows at night in order to breathe.    Complete by:  As directed      Diet Carb Modified    Complete by:  As directed      Increase activity slowly    Complete by:  As directed             DISPOSITION: home with home health, PT, OT, RN, home health aide   DISCHARGE FOLLOW-UP Follow-up Information    Follow up with [facility].   Specialty:  Home Health Services   Why:  They will continue to do your home health care at your home   Contact information:   [address] [place] [address] [phone]       Follow up with [name], MD.   Specialty:  Internal Medicine   Why:  Office will contact patient with hospital follow up appointment   Contact information:   [place] [address] [phone]       Follow up with Sanda Klein, MD. Go on 07/13/2015.   Specialty:  Cardiology   Why:  At 3:30pm  for hospital follow-up   Contact information:   [address] [place] [address] [phone]        Time spent on Discharge: 35 minutes  Signed:   RAI,RIPUDEEP M.D. [facility] 07/01/2015, 12:08 PM Pager: [phone]

## 2015-07-01 NOTE — Progress Notes (Signed)
Patient is discharge to home accompanied by patient's daughter and son  and  Two NT via wheelchair. Discharge instructions given . Family members verbalizes understanding. All personal belongings given. Telemetry box and IV removed prior to discharge and site in good condition.

## 2015-07-01 NOTE — Care Management Note (Addendum)
Case Management Note  Patient Details  Name: [name] MRN: [id number] Date of Birth: 29-Dec-1920  Subjective/Objective:  82 y.o. female admitted on [id number] chronic systolic and diastolic heart failure LVEF 45-50%, medically managed CAD, hypertension, and hyperlipidemia admitted with chest pain. She lives at home in the care of her family.She will be discharged home with [facility] today.Called and made [place] with [facility] aware of pending discharge. Will fax orders, Discharge summary and facesheet to [phone] at the request of [facility]. No further CM needs at this time.                   Action/Plan: Active with [facility] prior to admission HHPT,OT,RN and Aide.  Expected Discharge Date:                  Expected Discharge Plan:  [place]  In-House Referral:     Discharge planning Services  CM Consult  Post Acute Care Choice:    Choice offered to:  Adult Children  DME Arranged:    DME Agency:     HH Arranged:  RN, PT, OT, Nurse's Aide [place] Agency:  [facility]  Status of Service:  In process, will continue to follow  Medicare Important Message Given:    Date Medicare IM Given:    Medicare IM give by:    Date Additional Medicare IM Given:    Additional Medicare Important Message give by:     If discussed at [place] of Stay Meetings, dates discussed:    Additional Comments:  Delrae Sawyers, RN 07/01/2015, 11:17 AM

## 2015-07-01 NOTE — Telephone Encounter (Signed)
Spoke to caller.  They are requesting [place] to send an order for a home scale which will accommodate patient in her wheelchair, so that they can keep up with CHF management. She has spoken to a representative at [employer], who state the order would require a Pre-Authorization.  Informed her I'm unfamiliar with the process by which this is done, but would relay the inquiry to provider.

## 2015-07-02 ENCOUNTER — Telehealth: Payer: Self-pay | Admitting: Cardiovascular Disease

## 2015-07-02 MED ORDER — ROSUVASTATIN CALCIUM 10 MG PO TABS: 10.0000 mg | ORAL_TABLET | ORAL | Status: DC

## 2015-07-02 NOTE — Telephone Encounter (Signed)
Follow-up    For the home scale to wiegh the pt  the daughter wants to maker the Md is aware they need to fill out the paper for a pre-authorization

## 2015-07-02 NOTE — Telephone Encounter (Signed)
Attempted to contact [place] is full - unable to leave a message. Left message with patient's son, Jenny Reichmann, to have either him or [name] return call.  Have not received anything for Dr C to sign. Previous note from 8:25a sounds like [name] may have paperwork.

## 2015-07-02 NOTE — Telephone Encounter (Signed)
Follow up     Returning a call to the pharmacist

## 2015-07-02 NOTE — Telephone Encounter (Signed)
Spoke to pt daughter she has several questions about medications after discharge.  Wanting to go back on Crestor rather than lipitor. Sent Rx for 3 times weekly crestor since she has been unable to tolerate lipitor in the past.   She was prescribed a new supplement - L-arginine and a probiotic [facility], dispensing). Arginine may increase risk of bleeding.  Will also call Estill Bamberg (homehealth nurse) to give order to check INR at next home visit. Spoke with Estill Bamberg she will be going to home on Monday and will call with INR then.

## 2015-07-02 NOTE — Telephone Encounter (Signed)
Returned call, explained we would need some kind of form faxed to [place] for signature, [name] is going to contact DME company she has spoken to and have them fax the paperwork to our office. Fax number for our office provided. No further questions at this time.

## 2015-07-02 NOTE — Telephone Encounter (Signed)
New message      The daughter just got the pt home from the hospital yesterday afternoon and the pt is on Coumadin, but on the discharge paper work the hospital advised the family to start a supplement.   The daughter wants to make it is ok to start the supplement, the daughter was unclear on what the supplement was for.

## 2015-07-02 NOTE — Telephone Encounter (Signed)
Im here all day if the necessary paperwork can get to me by the end of business hours.

## 2015-07-02 NOTE — Telephone Encounter (Signed)
Would you please see if Dr C would send what he needs today,so patient can get her scales.Thank you very much!

## 2015-07-02 NOTE — Telephone Encounter (Signed)
Follow-up    The daughter is calling about the pre-authorization form to be fax in for the pt to get th scale they need to weigh the pt.

## 2015-07-02 NOTE — Telephone Encounter (Signed)
LMTCB. Looks like only new "supplement" is the senna/docusate, which should be ok with warfarin.

## 2015-07-03 ENCOUNTER — Telehealth: Payer: Self-pay | Admitting: Physician Assistant

## 2015-07-03 NOTE — Telephone Encounter (Signed)
Pt daughter is concerned about getting the pacer site wet, s/p gen change 06/25/2015. Is it ok to wash the site and do they need to continue the dressings. Staples are still in, she missed her original f/u appt because she was back in the hospital.  Plan: continue to keep the site dry and dressed for now. Will send a staff message to the office to call her on Monday. She needs an appointment for staple removal, hospital f/u is not until 06/27.   Pt daughter agrees with this plan.  Rosaria Ferries, PA-C 07/03/2015 10:02 AM Beeper [phone]

## 2015-07-05 ENCOUNTER — Ambulatory Visit (INDEPENDENT_AMBULATORY_CARE_PROVIDER_SITE_OTHER): Payer: PPO | Admitting: Pharmacist

## 2015-07-05 ENCOUNTER — Other Ambulatory Visit: Payer: Self-pay | Admitting: Endocrinology

## 2015-07-05 DIAGNOSIS — Z7901 Long term (current) use of anticoagulants: Secondary | ICD-10-CM

## 2015-07-05 DIAGNOSIS — I824Z9 Acute embolism and thrombosis of unspecified deep veins of unspecified distal lower extremity: Secondary | ICD-10-CM

## 2015-07-05 LAB — POCT INR: INR: 2.1

## 2015-07-06 ENCOUNTER — Telehealth: Payer: Self-pay | Admitting: Gastroenterology

## 2015-07-06 ENCOUNTER — Telehealth: Payer: Self-pay | Admitting: Cardiovascular Disease

## 2015-07-06 NOTE — Telephone Encounter (Signed)
Pt's daughter called in stating that the pt had a pace maker place and she missed her initial wound check visit. The daughter wanted to see if there were any precautions needed be taken. Please f/u   Thanks

## 2015-07-06 NOTE — Telephone Encounter (Signed)
Spoke with [name], [name]'s daughter.  [name] reports that [name] was in the hospital on the day of her previously scheduled device check.  Advised that [name] should have a wound check as soon as possible as she needs to have her staples removed and [name] is agreeable.  She is unable to bring the [name] in today, but is agreeable to a device clinic appointment on 07/08/15 at 4:30pm.  Rescheduled 07/14/15 wound check appointment to 07/08/15.  [name] is aware that this appointment is at the [facility] office.  She is appreciative and denies additional questions or concerns at this time.

## 2015-07-07 ENCOUNTER — Encounter: Payer: Self-pay | Admitting: Endocrinology

## 2015-07-07 ENCOUNTER — Ambulatory Visit (INDEPENDENT_AMBULATORY_CARE_PROVIDER_SITE_OTHER): Payer: PPO | Admitting: Endocrinology

## 2015-07-07 VITALS — BP 118/70 | HR 66

## 2015-07-07 DIAGNOSIS — E1165 Type 2 diabetes mellitus with hyperglycemia: Secondary | ICD-10-CM

## 2015-07-07 DIAGNOSIS — E119 Type 2 diabetes mellitus without complications: Secondary | ICD-10-CM | POA: Diagnosis not present

## 2015-07-07 DIAGNOSIS — E038 Other specified hypothyroidism: Secondary | ICD-10-CM

## 2015-07-07 DIAGNOSIS — E063 Autoimmune thyroiditis: Secondary | ICD-10-CM

## 2015-07-07 LAB — TSH: TSH: 7.99 u[IU]/mL — ABNORMAL HIGH (ref 0.35–4.50)

## 2015-07-07 LAB — COMPREHENSIVE METABOLIC PANEL
ALK PHOS: 71 U/L (ref 39–117)
ALT: 9 U/L (ref 0–35)
AST: 15 U/L (ref 0–37)
Albumin: 3.6 g/dL (ref 3.5–5.2)
BILIRUBIN TOTAL: 0.9 mg/dL (ref 0.2–1.2)
BUN: 33 mg/dL — ABNORMAL HIGH (ref 6–23)
CALCIUM: 9.5 mg/dL (ref 8.4–10.5)
CO2: 35 meq/L — AB (ref 19–32)
Chloride: 97 mEq/L (ref 96–112)
Creatinine, Ser: 1.68 mg/dL — ABNORMAL HIGH (ref 0.40–1.20)
GFR: 36.57 mL/min — AB (ref >60.00)
Glucose, Bld: 190 mg/dL — ABNORMAL HIGH (ref 70–99)
POTASSIUM: 4.4 meq/L (ref 3.5–5.1)
Sodium: 138 mEq/L (ref 135–145)
Total Protein: 6.9 g/dL (ref 6.0–8.3)

## 2015-07-07 LAB — GLUCOSE, POCT (MANUAL RESULT ENTRY): POC GLUCOSE: 171 mg/dL — AB (ref 70–99)

## 2015-07-07 MED ORDER — SITAGLIPTIN PHOSPHATE 50 MG PO TABS: 50.0000 mg | ORAL_TABLET | Freq: Every day | ORAL | Status: DC

## 2015-07-07 NOTE — Patient Instructions (Addendum)
Check blood sugars on waking up 2-3  times a week  Also check blood sugars about 2 hours after a meal and do this after different meals by rotation  Recommended blood sugar levels on waking up is 90-130 and about 2 hours after meal is 130-160  Please bring your blood sugar monitor to each visit, thank you  Januvia 1/2 pill daily

## 2015-07-07 NOTE — Progress Notes (Signed)
Patient ID: [name], female   DOB: 27-May-1920, 82 y.o.   MRN: [id number]   Reason for Appointment: Diabetes follow-up   History of Present Illness   Diagnosis: Type 2 diabetes mellitus, date of diagnosis: 1972.   She had been on insulin for several years and this had been tapered off in 2013 because of weight loss and lower blood sugars. Subsequently blood sugars had been generally reasonably good.  Because of her age and multiple medical problems she has been monitored without insulin  Recent history:  She has not been seen in follow-up for over 6 months Because of her age her medications have been reduced or stopped because of tendency to hypoglycemia She was recommended starting Tradjenta instead of low-dose glipizide because of hypoglycemia but this was too expensive for her.  Current management, blood sugar patterns and problems:  She did not bring her monitor for download and not clear what her blood sugars are  Recently apparently from her only members diary her blood sugars are mostly in the 170-220 range both before breakfast and supper although not clear how reliable these are Her blood sugars were over 200 in the hospital temporarily recently Monitors blood glucose: 2 times daily.   Glucometer: Accucheck Aviva.  Recent readings  191, 206, 178, 210, 220, 157 Now 171 in the office  Meals: 2 meals at about noon and 7 pm; sometimes has lemonade or sprite. Has small portions, has some grapes for snacks in the afternoon  Dietician visit: Most recent:2001.     Wt Readings from Last 3 Encounters:  07/01/15 195 lb 4.8 oz (88.587 kg)  06/22/15 186 lb (84.369 kg)  03/30/15 186 lb 11.7 oz (84.7 kg)   Lab Results  Component Value Date   HGBA1C 7.3* 06/24/2015   HGBA1C 7.6 01/14/2015   HGBA1C 7.1 09/30/2014   Lab Results  Component Value Date   LDLCALC 61 06/24/2013   CREATININE 1.66* 07/01/2015       Medication List       This list is  accurate as of: 07/07/15  3:49 PM.  Always use your most recent med list.               acetaminophen 500 MG tablet  Commonly known as:  TYLENOL  Take 500 mg by mouth every 6 (six) hours as needed (pain).     aspirin EC 81 MG tablet  Take 81 mg by mouth daily.     atropine 1 % ophthalmic solution  Place 1 drop into both eyes daily as needed (burning eyes).     cholecalciferol 1000 units tablet  Commonly known as:  VITAMIN D  Take 1,000 Units by mouth daily.     donepezil 5 MG tablet  Commonly known as:  ARICEPT  Take 5 mg by mouth at bedtime.     folic acid 1 MG tablet  Commonly known as:  FOLVITE  take 1 tablet by mouth once daily     furosemide 40 MG tablet  Commonly known as:  LASIX  Take 1 tablet (40 mg total) by mouth 2 (two) times daily.     gabapentin 100 MG capsule  Commonly known as:  NEURONTIN  Take 100 mg by mouth 2 (two) times daily.     glipiZIDE 5 MG tablet  Commonly known as:  GLUCOTROL  TAKE ONE-HALF TABLET BY  MOUTH DAILY ONLY IF BLOOD  SUGAR IS OVER 200     loratadine 10 MG tablet  Commonly known as:  CLARITIN  Take 10 mg by mouth daily.     meclizine 25 MG tablet  Commonly known as:  ANTIVERT  Take 25 mg by mouth 3 (three) times daily as needed for dizziness.     MUCUS RELIEF CHEST CONGESTION 400 MG Tabs tablet  Generic drug:  guaifenesin  Take 400 mg by mouth 2 (two) times daily as needed (cough/ congestion).     nitroGLYCERIN 0.2 mg/hr patch  Commonly known as:  NITRODUR - Dosed in mg/24 hr  Place 1 patch (0.2 mg total) onto the skin daily.     omeprazole 20 MG capsule  Commonly known as:  PRILOSEC  Take 20 mg by mouth daily before breakfast.     ONE TOUCH ULTRA 2 w/Device Kit  Use to check blood sugar 2 times per day dx code E11.65     ONE TOUCH ULTRA TEST test strip  Generic drug:  glucose blood  TEST TWICE DAILY     OVER THE COUNTER MEDICATION  Take 1 Can by mouth daily. Enterex nutritional supplement - with choline     OVER  THE COUNTER MEDICATION  Take 1 tablet by mouth 2 (two) times daily with a meal. FD Guard     OVER THE COUNTER MEDICATION  Take 1 tablet by mouth daily. Micro-algae DHA     OVER THE COUNTER MEDICATION  Take 1 capsule by mouth daily. Choline 100 mg, trimethylglycine 50 mg, glutathione 50 mg, methyltetrahydrofolate, l-arginine-, l-ornithine, l-citruline     potassium chloride 10 MEQ tablet  Commonly known as:  K-DUR  take 1 tablet by mouth twice a day     PROAIR HFA 108 (90 Base) MCG/ACT inhaler  Generic drug:  albuterol  Inhale 2 puffs into the lungs every 6 (six) hours as needed for wheezing or shortness of breath.     ranolazine 500 MG 12 hr tablet  Commonly known as:  [name]  Take 1 tablet (500 mg total) by mouth 2 (two) times daily.     REFRESH OP  Place 1 drop into both eyes daily as needed (dry eyes).     rosuvastatin 10 MG tablet  Commonly known as:  CRESTOR  Take 1 tablet (10 mg total) by mouth 3 (three) times a week.     senna-docusate 8.6-50 MG tablet  Commonly known as:  SENOKOT S  Take 1 tablet by mouth 2 (two) times daily. For constipation     sitaGLIPtin 50 MG tablet  Commonly known as:  JANUVIA  Take 1 tablet (50 mg total) by mouth daily.     SYNTHROID 50 MCG tablet  Generic drug:  levothyroxine  take 1 tablet by mouth once daily BEFORE BREAKFAST     traMADol 50 MG tablet  Commonly known as:  ULTRAM  Take 1 tablet (50 mg total) by mouth every 6 (six) hours as needed for moderate pain.     warfarin 3 MG tablet  Commonly known as:  COUMADIN  Take 1 to 1.5 tablets by mouth daily or as directed by coumadin clinic     zolpidem 5 MG tablet  Commonly known as:  AMBIEN  Take 0.5 tablets (2.5 mg total) by mouth at bedtime. sleep        Past Medical History  Diagnosis Date  . Hypertension   . Legally blind   . CKD (chronic kidney disease), stage III     a. iii - iv.  . Anemia   . Hypothyroid   .  Gastric ulcer   . Hiatal hernia   . Dyspepsia   . UTI  (lower urinary tract infection)   . H/O: GI bleed 12//13  . High cholesterol   . Chronic combined systolic and diastolic CHF (congestive heart failure) (HCC)     a. 01/2014 Echo: EF @ least mod-sev reduced with HK of lat/apical, basal inf walls. basalpost AK, Gr 1DD.  Marland Kitchen DVT of upper extremity (deep vein thrombosis) (HCC) 06/13/2012    BUE  . Seasonal allergies   . Allergy to perfume   . Type II diabetes mellitus (HCC)   . History of blood transfusion 2013  . GERD (gastroesophageal reflux disease)   . Daily headache   . Arthritis   . PAF (paroxysmal atrial fibrillation) (HCC)     a. CHA2DS2VASc = 7-->coumadin.  . 2Nd degree atrioventricular block     a. s/p MDT ADDR01 DC PPM (ser #: [id number]).  . Coronary artery disease     a. 1994 s/p cabg;  b. 09/2013 MV: large, sev intensity, partially reversible inf, apical defect, prior inf/ap infarct w/ mild peri-infarct ischemia->Med Rx.  . Presence of permanent cardiac pacemaker     Past Surgical History  Procedure Laterality Date  . Esophagogastroduodenoscopy  12/22/2011    Procedure: ESOPHAGOGASTRODUODENOSCOPY (EGD);  Surgeon: Iva Boop, MD;  Location: [facility];  Service: Endoscopy;  Laterality: N/A;  . Cardioversion  06/06/06    successful  . Insert / replace / remove pacemaker  06/29/2006    Medtronic adapta  . Cardiac catheterization  10/25/92  . Cardiac catheterization  11/18/03    w/grafts 100%CX LAD 80 & 100%  . Cardiac catheterization  01/24/05    diffuse disease of native vessels  . Cardiac catheterization  06/06/06    severe native CAD  . Coronary artery bypass graft  10/27/92    LIMA to LAD,SVG to LAD second diagonal,obtuse maraginal of the CX and posterior descendingbranch of the RCA  . Cataract extraction w/ intraocular lens  implant, bilateral Bilateral   . Refractive surgery Bilateral   . Ep implantable device N/A 06/22/2015    Procedure: PPM/BIV PPM Generator Changeout;  Surgeon: Thurmon Fair, MD;  Location: [facility]  INVASIVE CV LAB;  Service: Cardiovascular;  Laterality: N/A;    Family History  Problem Relation Age of Onset  . Breast cancer Daughter   . Diabetes Son   . Diabetes Daughter   . Heart disease Father   . Diabetes Father   . Colon polyps Daughter   . Heart attack Brother   . Diabetes Brother   . Stroke Sister   . Colon cancer Neg Hx     Social History:  reports that she has never smoked. She has never used smokeless tobacco. She reports that she does not drink alcohol or use illicit drugs.  Allergies:  Allergies  Allergen Reactions  . Darvon Other (See Comments)    confusion  . Digoxin And Related Nausea Only  . Penicillins Other (See Comments)    Was told had allergy from childhood... Unknown reaction  . Percocet [Oxycodone-Acetaminophen] Other (See Comments)    confusion  . Percodan [Oxycodone-Aspirin] Other (See Comments)    confusion  . Vicodin [Hydrocodone-Acetaminophen] Other (See Comments)    confusion    REVIEW of systems:  Hypothyroidism: She is taking levothyroxine  50 mcg daily  She was told to take an extra pill weekly but she has not done so since last visit when her TSH was high   Lab  Results  Component Value Date   TSH 7.96* 06/08/2015   TSH 3.116 08/19/2014   TSH 2.65 06/25/2014   FREET4 0.70 06/25/2014   FREET4 0.65 06/24/2013   FREET4 0.93 01/24/2013     She has had renal dysfunction which is long-standing    Lab Results  Component Value Date   CREATININE 1.66* 07/01/2015    Has had periodic pedal edema. Does wear elastic stockings  She has significant neuropathy with sensory loss on previous  foot exam.     Examination:   BP 118/70 mmHg  Pulse 66  SpO2 95%  There is no weight on file to calculate BMI.    Assesment/plan:   1.  Diabetes type 2, with obesity, long-standing   Now currently not on any medication Her blood sugars are moderately high.  Not clear if A1c is reliable and her case  Although she may be fine without  any medications because of her numerous other medical problems and tendency to infections such as UTI and sepsis will try to improve her blood sugar control with the help of Januvia  She can start with 50 mg tablet and use a half a tablet until her renal function is determined again and adjust accordingly She'll need to check some readings after meals also Advised regular follow-up  2.  Hypothyroidism: Will check labs today   [facility] 07/07/2015, 3:49 PM        .

## 2015-07-07 NOTE — Progress Notes (Signed)
Quick Note:  Please let her daughter [name] know that thyroid level is low, change prescription to Synthroid 125 g, half tablet daily ______

## 2015-07-08 ENCOUNTER — Ambulatory Visit (INDEPENDENT_AMBULATORY_CARE_PROVIDER_SITE_OTHER): Payer: PPO | Admitting: *Deleted

## 2015-07-08 ENCOUNTER — Telehealth: Payer: Self-pay | Admitting: Family Medicine

## 2015-07-08 DIAGNOSIS — I442 Atrioventricular block, complete: Secondary | ICD-10-CM | POA: Diagnosis not present

## 2015-07-08 LAB — CUP PACEART INCLINIC DEVICE CHECK
Battery Remaining Longevity: 133 mo
Battery Voltage: 2.8 V
Brady Statistic AP VP Percent: 3 %
Brady Statistic AS VP Percent: 97 %
Date Time Interrogation Session: 20170622170043
Implantable Lead Location: 753859
Implantable Lead Model: 5076
Lead Channel Impedance Value: 605 Ohm
Lead Channel Pacing Threshold Amplitude: 0.75 V
Lead Channel Pacing Threshold Pulse Width: 0.4 ms
Lead Channel Pacing Threshold Pulse Width: 0.4 ms
Lead Channel Setting Pacing Amplitude: 1.5 V
Lead Channel Setting Sensing Sensitivity: 5.6 mV
MDC IDC LEAD IMPLANT DT: 20080613
MDC IDC LEAD IMPLANT DT: 20080613
MDC IDC LEAD LOCATION: 753860
MDC IDC MSMT BATTERY IMPEDANCE: 100 Ohm
MDC IDC MSMT LEADCHNL RA IMPEDANCE VALUE: 408 Ohm
MDC IDC MSMT LEADCHNL RA PACING THRESHOLD AMPLITUDE: 0.625 V
MDC IDC MSMT LEADCHNL RA SENSING INTR AMPL: 1.4 mV
MDC IDC MSMT LEADCHNL RV PACING THRESHOLD AMPLITUDE: 0.875 V
MDC IDC MSMT LEADCHNL RV PACING THRESHOLD AMPLITUDE: 1 V
MDC IDC MSMT LEADCHNL RV PACING THRESHOLD PULSEWIDTH: 0.4 ms
MDC IDC MSMT LEADCHNL RV PACING THRESHOLD PULSEWIDTH: 0.4 ms
MDC IDC SET LEADCHNL RV PACING AMPLITUDE: 2.5 V
MDC IDC SET LEADCHNL RV PACING PULSEWIDTH: 0.4 ms
MDC IDC STAT BRADY AP VS PERCENT: 0 %
MDC IDC STAT BRADY AS VS PERCENT: 1 %

## 2015-07-08 LAB — FRUCTOSAMINE: FRUCTOSAMINE: 371 umol/L — AB (ref 0–285)

## 2015-07-08 MED ORDER — LEVOTHYROXINE SODIUM 125 MCG PO TABS: ORAL_TABLET | ORAL | Status: DC

## 2015-07-08 NOTE — Telephone Encounter (Signed)
Once a day at breakfast

## 2015-07-08 NOTE — Telephone Encounter (Signed)
Patient's daughter asking when Patient should be taking Januvia?  Morning, evening, before or after meals?  Please advise.

## 2015-07-08 NOTE — Progress Notes (Signed)
Wound check appointment. Staples removed. Wound without redness or edema. Incision edges approximated, wound well healed. Normal device function. Thresholds, sensing, and impedances consistent with implant measurements. Adaptive programmed on at implant. Histogram distribution appropriate for patient and level of activity. 2 mode switches  less than 30sec. No high ventricular rates noted. Patient educated about wound care, arm mobility, lifting restrictions. ROV with [facility] 10/15/2015.

## 2015-07-08 NOTE — Telephone Encounter (Signed)
Patients daughter aware

## 2015-07-08 NOTE — Addendum Note (Signed)
Addended by: Ralph Dowdy on: 07/08/2015 08:52 AM   Modules accepted: Orders

## 2015-07-13 ENCOUNTER — Ambulatory Visit (INDEPENDENT_AMBULATORY_CARE_PROVIDER_SITE_OTHER): Payer: PPO | Admitting: Nurse Practitioner

## 2015-07-13 ENCOUNTER — Ambulatory Visit (INDEPENDENT_AMBULATORY_CARE_PROVIDER_SITE_OTHER): Payer: PPO | Admitting: Pharmacist Clinician (PhC)/ Clinical Pharmacy Specialist

## 2015-07-13 ENCOUNTER — Encounter: Payer: Self-pay | Admitting: Nurse Practitioner

## 2015-07-13 VITALS — BP 96/61 | HR 66 | Ht 64.0 in

## 2015-07-13 DIAGNOSIS — I214 Non-ST elevation (NSTEMI) myocardial infarction: Secondary | ICD-10-CM | POA: Diagnosis not present

## 2015-07-13 DIAGNOSIS — I119 Hypertensive heart disease without heart failure: Secondary | ICD-10-CM | POA: Insufficient documentation

## 2015-07-13 DIAGNOSIS — I480 Paroxysmal atrial fibrillation: Secondary | ICD-10-CM | POA: Diagnosis not present

## 2015-07-13 DIAGNOSIS — I25119 Atherosclerotic heart disease of native coronary artery with unspecified angina pectoris: Secondary | ICD-10-CM | POA: Diagnosis not present

## 2015-07-13 DIAGNOSIS — I82409 Acute embolism and thrombosis of unspecified deep veins of unspecified lower extremity: Secondary | ICD-10-CM | POA: Diagnosis not present

## 2015-07-13 DIAGNOSIS — I824Z9 Acute embolism and thrombosis of unspecified deep veins of unspecified distal lower extremity: Secondary | ICD-10-CM | POA: Diagnosis not present

## 2015-07-13 DIAGNOSIS — I441 Atrioventricular block, second degree: Secondary | ICD-10-CM | POA: Insufficient documentation

## 2015-07-13 DIAGNOSIS — Z7901 Long term (current) use of anticoagulants: Secondary | ICD-10-CM | POA: Diagnosis not present

## 2015-07-13 DIAGNOSIS — I5042 Chronic combined systolic (congestive) and diastolic (congestive) heart failure: Secondary | ICD-10-CM

## 2015-07-13 DIAGNOSIS — I110 Hypertensive heart disease with heart failure: Secondary | ICD-10-CM

## 2015-07-13 LAB — POCT INR: INR: 1.9

## 2015-07-13 NOTE — Progress Notes (Signed)
Office Visit    Patient Name: [name] Date of Encounter: 07/13/2015  Primary Care Provider:  [name], MD Primary Cardiologist:  Jerilynn Mages. Croitoru, MD   Chief Complaint    82 year old female with a history of CAD and recent non-STEMI who presents for follow-up.  Past Medical History    Past Medical History  Diagnosis Date  . Hypertensive heart disease   . Legally blind   . CKD (chronic kidney disease), stage III     a. iii - iv.  . Anemia   . Hypothyroid   . Gastric ulcer   . Hiatal hernia   . Dyspepsia   . UTI (lower urinary tract infection)   . H/O: GI bleed 12//13  . High cholesterol   . Chronic combined systolic and diastolic CHF (congestive heart failure) ([place])     a. 01/2014 Echo: EF @ least mod-sev reduced with HK of lat/apical, basal inf walls. basalpost AK, Gr 1DD; b. 06/2015 Echo: EF 45-50%, Gr2 DD, mod LVH.   . DVT of upper extremity (deep vein thrombosis) ([place]) 06/13/2012    BUE  . Seasonal allergies   . Allergy to perfume   . Type II diabetes mellitus ([place])   . History of blood transfusion 2013  . GERD (gastroesophageal reflux disease)   . Daily headache   . Arthritis   . PAF (paroxysmal atrial fibrillation) (HCC)     a. CHA2DS2VASc = 7-->coumadin.  . 2Nd degree atrioventricular block     a. 06/22/15 Gen change: MDT ADDR01 Adapta, DC PPM (ser# [id number]).  . Coronary artery disease     a. 1994 s/p cabg;  b. 09/2013 MV: large, sev intensity, partially reversible inf, apical defect, prior inf/ap infarct w/ mild peri-infarct ischemia->Med Rx; c. 06/2015 NSTEMI (trop 6)->Med rx.  . Presence of permanent cardiac pacemaker     a. 06/22/15 Gen change: MDT ADDR01 Adapta, DC PPM (ser# [id number]).   Past Surgical History  Procedure Laterality Date  . Esophagogastroduodenoscopy  12/22/2011    Procedure: ESOPHAGOGASTRODUODENOSCOPY (EGD);  Surgeon: Gatha Mayer, MD;  Location: [facility];  Service: Endoscopy;  Laterality: N/A;  . Cardioversion  06/06/06    successful  . Insert / replace / remove pacemaker  06/29/2006    Medtronic adapta  . Cardiac catheterization  10/25/92  . Cardiac catheterization  11/18/03    w/grafts 100%CX LAD 80 & 100%  . Cardiac catheterization  01/24/05    diffuse disease of native vessels  . Cardiac catheterization  06/06/06    severe native CAD  . Coronary artery bypass graft  10/27/92    LIMA to LAD,SVG to LAD second diagonal,obtuse maraginal of the CX and posterior descendingbranch of the RCA  . Cataract extraction w/ intraocular lens  implant, bilateral Bilateral   . Refractive surgery Bilateral   . Ep implantable device N/A 06/22/2015    Procedure: PPM/BIV PPM Generator Changeout;  Surgeon: Sanda Klein, MD;  Location: [facility] CV [facility];  Service: Cardiovascular;  Laterality: N/A;    Allergies  Allergies  Allergen Reactions  . Darvon Other (See Comments)    confusion  . Digoxin And Related Nausea Only  . Penicillins Other (See Comments)    Was told had allergy from childhood... Unknown reaction  . Percocet [Oxycodone-Acetaminophen] Other (See Comments)    confusion  . Percodan [Oxycodone-Aspirin] Other (See Comments)    confusion  . Vicodin [Hydrocodone-Acetaminophen] Other (See Comments)    confusion    History of Present Illness  82 year old female with a prior history of coronary artery disease status post remote coronary artery bypass grafting. She also has history of paroxysmal atrial fibrillation and chronic Coumadin, hypertension, hyperlipidemia, DM II, CKD II-III, and 2nd deg AVB s/p PPM. She is recently status post generator change out in early June and was admitted 2 days later with discomfort around the pacemaker incision site as well as reproducible midsternal chest discomfort that was worse with palpation. She initially had mild troponin elevation and it subsequently rose to 6.82. Our team saw her in the hospital. Her pacemaker was functioning normally. With her history of advanced age,  chronic kidney disease, and wishes by her and her family to avoid invasive evaluation, medical therapy was recommended. Echocardiogram was performed and did show stable, mild LV dysfunction with an EF of 45-50%. She was subsequently discharged and since then, has been doing relatively well. She does occasionally have discomfort over her left breast that feels like a drawing sensation and is without associated symptoms. This can last up to about 5 minutes but will resolve spontaneously. She also continues have mild discomfort around her pacemaker site. Her daughter has noticed some increasing lower extremity edema, though believes her weight has been stable. They do have a special scale at home that allows them to weigh her on a wheelchair. She has not been complaining of PND or orthopnea. She does not exert often, but has not been having any dyspnea on exertion.  Home Medications    Prior to Admission medications   Medication Sig Start Date End Date Taking? Authorizing Provider  acetaminophen (TYLENOL) 500 MG tablet Take 500 mg by mouth every 6 (six) hours as needed (pain).   Yes Historical Provider, MD  albuterol (PROAIR HFA) 108 (90 Base) MCG/ACT inhaler Inhale 2 puffs into the lungs every 6 (six) hours as needed for wheezing or shortness of breath.   Yes Historical Provider, MD  aspirin EC 81 MG tablet Take 81 mg by mouth daily.   Yes Historical Provider, MD  atropine 1 % ophthalmic solution Place 1 drop into both eyes daily as needed (burning eyes).  10/29/13  Yes Historical Provider, MD  Blood Glucose Monitoring Suppl (ONE TOUCH ULTRA 2) w/Device KIT Use to check blood sugar 2 times per day dx code E11.65 02/23/15  Yes Elayne Snare, MD  cholecalciferol (VITAMIN D) 1000 units tablet Take 1,000 Units by mouth daily.   Yes Historical Provider, MD  donepezil (ARICEPT) 5 MG tablet Take 5 mg by mouth at bedtime.  07/13/14  Yes Historical Provider, MD  folic acid (FOLVITE) 1 MG tablet take 1 tablet by mouth  once daily 05/19/15  Yes Mihai Croitoru, MD  furosemide (LASIX) 40 MG tablet Take 1 tablet (40 mg total) by mouth 2 (two) times daily. 07/01/15  Yes Ripudeep Krystal Eaton, MD  gabapentin (NEURONTIN) 100 MG capsule Take 100 mg by mouth 2 (two) times daily.    Yes Historical Provider, MD  glipiZIDE (GLUCOTROL) 5 MG tablet TAKE ONE-HALF TABLET BY  MOUTH DAILY ONLY IF BLOOD  SUGAR IS OVER 200 Patient taking differently: TAKE ONE-HALF TABLET BY  MOUTH DAILY ONLY IF BLOOD  SUGAR IS 300 OR MORE 12/14/14  Yes Elayne Snare, MD  guaifenesin (MUCUS RELIEF CHEST CONGESTION) 400 MG TABS tablet Take 400 mg by mouth 2 (two) times daily as needed (cough/ congestion).   Yes Historical Provider, MD  levothyroxine (SYNTHROID, LEVOTHROID) 125 MCG tablet Half tablet daily 07/08/15  Yes Elayne Snare, MD  loratadine [facility])  10 MG tablet Take 10 mg by mouth daily.   Yes Historical Provider, MD  meclizine (ANTIVERT) 25 MG tablet Take 25 mg by mouth 3 (three) times daily as needed for dizziness.   Yes Historical Provider, MD  nitroGLYCERIN (NITRODUR - DOSED IN MG/24 HR) 0.2 mg/hr patch Place 1 patch (0.2 mg total) onto the skin daily. 12/21/14  Yes Mihai Croitoru, MD  omeprazole (PRILOSEC) 20 MG capsule Take 20 mg by mouth daily before breakfast.    Yes Historical Provider, MD  ONE TOUCH ULTRA TEST test strip TEST TWICE DAILY 07/05/15  Yes Elayne Snare, MD  OVER THE COUNTER MEDICATION Take 1 Can by mouth daily. Enterex nutritional supplement - with choline   Yes Historical Provider, MD  OVER THE COUNTER MEDICATION Take 1 tablet by mouth 2 (two) times daily with a meal. FD Guard   Yes Historical Provider, MD  OVER THE COUNTER MEDICATION Take 1 tablet by mouth daily. Micro-algae DHA   Yes Historical Provider, MD  OVER THE COUNTER MEDICATION Take 1 capsule by mouth daily. Choline 100 mg, trimethylglycine 50 mg, glutathione 50 mg, methyltetrahydrofolate, l-arginine-, l-ornithine, l-citruline   Yes Historical Provider, MD  Polyvinyl  Alcohol-Povidone (REFRESH OP) Place 1 drop into both eyes daily as needed (dry eyes).    Yes Historical Provider, MD  potassium chloride (K-DUR) 10 MEQ tablet take 1 tablet by mouth twice a day 06/16/15  Yes Mihai Croitoru, MD  ranolazine ([name]) 500 MG 12 hr tablet Take 1 tablet (500 mg total) by mouth 2 (two) times daily. 05/12/15  Yes Mihai Croitoru, MD  rosuvastatin (CRESTOR) 10 MG tablet Take 1 tablet (10 mg total) by mouth 3 (three) times a week. 07/02/15  Yes Mihai Croitoru, MD  senna-docusate (SENOKOT S) 8.6-50 MG tablet Take 1 tablet by mouth 2 (two) times daily. For constipation 07/01/15  Yes Ripudeep K Rai, MD  sitaGLIPtin (JANUVIA) 50 MG tablet Take 1 tablet (50 mg total) by mouth daily. 07/07/15  Yes Elayne Snare, MD  traMADol (ULTRAM) 50 MG tablet Take 1 tablet (50 mg total) by mouth every 6 (six) hours as needed for moderate pain. Patient taking differently: Take 50 mg by mouth 2 (two) times daily as needed for moderate pain.  08/22/14  Yes Orson Eva, MD  warfarin (COUMADIN) 3 MG tablet Take 1 to 1.5 tablets by mouth daily or as directed by coumadin clinic Patient taking differently: Take 3-4.5 mg by mouth daily after supper. Take 1 and 1/2 tablets (4.5 mg) on Monday, Wednesday and Friday, take 1 tablet (3 mg) on Sunday, Tuesday, Thursday, Saturday 03/08/15  Yes Mihai Croitoru, MD  zolpidem (AMBIEN) 5 MG tablet Take 0.5 tablets (2.5 mg total) by mouth at bedtime. sleep Patient taking differently: Take 5 mg by mouth. sleep 08/22/14  Yes Orson Eva, MD    Review of Systems    As above, she does have some discomfort around her pacemaker as well as intermittent left breast drawing-like sensation. She has mild left greater than right lower extremity swelling.  All other systems reviewed and are otherwise negative except as noted above.  Physical Exam    VS:  BP 96/61 mmHg  Pulse 66  Ht 5' 4" (1.626 m)  Wt  , BMI There is no weight on file to calculate BMI. GEN: Well nourished, well developed,  in no acute distress. HEENT: normal. Neck: Supple, no JVD, carotid bruits, or masses. Cardiac: RRR, no murmurs, rubs, or gallops. No clubbing, cyanosis, trace bilateral lower extremity edema.  Radials/DP/PT 2+ and equal bilaterally.  Respiratory:  Respirations regular and unlabored, clear to auscultation bilaterally. GI: Soft, nontender, nondistended, BS + x 4. MS: no deformity or atrophy. Skin: warm and dry, no rash. Neuro:  Strength and sensation are intact. Psych: Normal affect.  Accessory Clinical Findings    Lab Results  Component Value Date   CREATININE 1.68* 07/07/2015   BUN 33* 07/07/2015   NA 138 07/07/2015   K 4.4 07/07/2015   CL 97 07/07/2015   CO2 35* 07/07/2015   Lab Results  Component Value Date   WBC 6.1 07/01/2015   HGB 9.7* 07/01/2015   HCT 30.8* 07/01/2015   MCV 89.0 07/01/2015   PLT 140* 07/01/2015     Assessment & Plan    1.  Non-ST segment elevation myocardial infarction, subsequent episode of care/coronary artery disease: Ms. Hunton was recently admitted with somewhat atypical chest pain that was reproducible with palpation but did rule in for non-STEMI with her troponin eventually peaking at 6.82. Echo showed stable LV dysfunction with an EF of 45-50%. Medical therapy was recommended. Patient and family are not interested in any further ischemic or invasive evaluation. She occasionally has left chest/breast drawing sensation without associated symptoms, lasting a few minutes and resolving spontaneously. She does not take sublingual nitroglycerin as this has caused syncope in the past. She remains on [name] 500 mg twice a day, nitroglycerin patch, and Crestor. Her blood pressure is soft at 96/61 and she is not on beta blocker.  2. Chronic combined systolic and diastolic congestive heart failure: She appears to be relatively stable. Her daughter thought that her left lower extremity seemed more swollen than usual. She does have trace left greater than right  lower extremity edema. She is currently on Lasix 40 mg twice a day. She does spend most of her day sitting. Rather than adjust her Lasix, I have provided a prescription for compression stockings.  We discussed the importance of daily weights, sodium restriction, medication compliance, and symptom reporting and she verbalizes understanding. Her heart rate and blood pressure stable.  3. Hypertensive heart disease: Blood pressure is low if anything. She remains on Lasix.  4. Hyperlipidemia: She is treated with Crestor therapy. Last LDL we have on file is from June 2015 and was 61.  5. Paroxysmal atrial fibrillation: She is anticoagulated with Coumadin and was seen in Coumadin clinic today. She is not on any rate controlling agents.  6. Disposition: Follow-up in clinic in one month to see how she is doing. She has follow-up with Dr. Sallyanne Kuster in September.   Murray Hodgkins, NP 07/13/2015, 4:22 PM

## 2015-07-13 NOTE — Patient Instructions (Signed)
Your physician recommends that you schedule a follow-up appointment in: 1 month With Angeline Slim or Dr. Sallyanne Kuster.  Ignacia Bayley request that you wear TED hose. A prescription has been provided to you today. This will help with the swelling of your legs.

## 2015-07-14 ENCOUNTER — Ambulatory Visit: Payer: PPO

## 2015-07-16 ENCOUNTER — Telehealth: Payer: Self-pay | Admitting: Cardiovascular Disease

## 2015-07-16 ENCOUNTER — Other Ambulatory Visit: Payer: Self-pay

## 2015-07-16 NOTE — Patient Outreach (Signed)
[place] [facility]) Care Management  07/16/2015  [name] 1920/04/06 [id number]   Telephone call to patients daughter. Unable to speak at time of call.  Request call back.  PLAN: RNCM will attempt call back to patient within 1 week.  Quinn Plowman RN,BSN,CCM [facility] Telephonic  [phone]

## 2015-07-16 NOTE — Telephone Encounter (Signed)
She is weighing the pt for fluid retention. Does she need to get her weight every day or every other day?

## 2015-07-16 NOTE — Telephone Encounter (Signed)
Returned call. Gave recommendation for daily weight when possible. She explains they are taking pt out of town for weekend, wanted to know if OK and what to do about scale - note she needs specialty scale, cannot take with patient . Advised ok to travel, call to notify if discrepancy/ 3lb+ wt fluctuation, or other symptoms. Caller voiced understanding and thanks.

## 2015-07-19 ENCOUNTER — Telehealth: Payer: Self-pay | Admitting: Cardiovascular Disease

## 2015-07-19 ENCOUNTER — Ambulatory Visit (INDEPENDENT_AMBULATORY_CARE_PROVIDER_SITE_OTHER): Payer: PPO | Admitting: Pharmacist

## 2015-07-19 ENCOUNTER — Other Ambulatory Visit: Payer: Self-pay

## 2015-07-19 DIAGNOSIS — Z7901 Long term (current) use of anticoagulants: Secondary | ICD-10-CM

## 2015-07-19 DIAGNOSIS — I824Z9 Acute embolism and thrombosis of unspecified deep veins of unspecified distal lower extremity: Secondary | ICD-10-CM

## 2015-07-19 LAB — POCT INR: INR: 2.2

## 2015-07-19 NOTE — Telephone Encounter (Signed)
New message       Per daughter the pt has a weight gain needs to speak with the nurse no other information provided

## 2015-07-19 NOTE — Patient Outreach (Signed)
[place] [facility]) Care Management  07/19/2015  Valkyrie Derose Rodarte 08/07/1920 [id number]     EMMI-HF RED ON EMMI ALERT Day # 10 & Day #12 Date: 07/16/15 & 07/18/15 Red Alert Reason: "Weighed themselves today? No"   Outreach attempt #1 to patient. Spoke with caregiver/dtr-Joyce. Discussed red alert. She reports that they do not weigh patient until midday as patient does not get up early. She reports that automated calls come too early and that's why she has to answer "no" to having not weighed. She confirmed that patient is being weighed daily. Patient's weight was up 1 lb from previous weight. Dtr voices she called MD ofifce already this am to alert them. However, she does state that patient had not taken diuretic yet as she had not eaten. She reports that patient has chronic BLE edema and its not any worse today. RN CM provided HF education to dtr. She verbalized understanding and was appreciatitve of call. No further RN CM needs or concerns at this.     Plan: RN CM will notify [facility] administrative assistant of case closure.   Enzo Montgomery, RN,BSN,CCM [place] Management Telephonic Care Management Coordinator Direct Phone: [phone] Toll Free: [phone] Fax: [phone]

## 2015-07-19 NOTE — Telephone Encounter (Signed)
Spoke with pt dtr, aware [facility] has been calling regarding weights. Sunday the pt weighted 174 and today 176. She has no SOB and no increase in edema. She has not taken her furosemide today, she is eating now and will take her meds after eating. Number to reach the [facility] network given to the dtr. They will call if weight continues to increase.

## 2015-07-28 ENCOUNTER — Other Ambulatory Visit: Payer: Self-pay

## 2015-07-28 NOTE — Patient Outreach (Signed)
[facility]) Care Management  07/28/2015  [name] [name] 22-Dec-1920 [id number]  REFERRAL DATE: 07/15/15  REFERRAL SOURCE:  EMMI heart failure program REFERRAL REASON:  Follow up EMMI heart failure program CONSENT:  Patient gave verbal consent to speak with her daughter, [name] regarding all of her medical information.  SUBJECTIVE:  Telephone call to patient regarding EMMI heart failure program.  Contact answering phone identified herself as patients daughter, [name].  Ms. Reeh informed by [facility] that a verbal consent from patient or Health care power of attorney on patients chart would be needed to speak further.  Daughter states she does have health care power of attorney on patient.  RNCM unable to locate health care power of attorney on patients chart.  Informed contact that patient would be contacted for verbal authorization. RNCM contacted patient.  Patient verified HIPAA.  Patient gave verbal consent to speak with her daughter, [name] regarding all of her medical information.   RNCM attempted to contact daughter back by phone.  Unable to reach after 2 attempts.   PLAN; RNCM will attempt telephone follow up with patients daughter within 1 week.  Quinn Plowman RN,BSN,CCM [facility] Telephonic  [phone]

## 2015-07-30 ENCOUNTER — Telehealth: Payer: Self-pay | Admitting: Cardiovascular Disease

## 2015-07-30 ENCOUNTER — Other Ambulatory Visit: Payer: Self-pay

## 2015-07-30 VITALS — Wt 178.0 lb

## 2015-07-30 DIAGNOSIS — I509 Heart failure, unspecified: Secondary | ICD-10-CM

## 2015-07-30 NOTE — Patient Outreach (Signed)
[place] [facility]) Care Management  07/30/2015  [name] 11/13/1920 [id number]   REFERRAL SOURCE; EMMI heart failure program REFERRAL REASON: EMMI heart failure red alert, went to follow up appointment NO, weighed themselves - NO  SUBJECTIVE; Telephone call to patient regarding RED alert referral.  Spoke with [name], daughter, as authorized by patient. HIPAA verified by patients daughter for patient.  Daughter states patient is doing ok.  Daughter denies any excess weight gain for patient.  Daughter states patient has only gained 1 1/2 pound for the week.  Daughter states patients weight today was 178lbs. Daughter denies patient complaining of shortness of breath.  Daughter states patient does have some swelling in her legs/feet. Daughter contributes swelling to the fact patient has been traveling over the last 2 days due to going to a funeral.  Daughter states patient continues to have home health skilled nursing and physical therapy. Daughter states patient saw her primary MD on 07/28/15. Daughter states the doctors report was good. Daughter states patient has to have blood work done.  Daughter denies any changes to patients medications. Daughter states she has followed  Up with patients cardiologist within the past week due to patient being constipated. Daughter states she has been giving patient senekot which has worked well. Daughter states she has been increasing the fiber in patients diet and following a low salt diet.  Daughter verbalized understanding of heart failure symptoms. Daughter verbalized concern about cooking with a low salt diet. Daughter requested with low salt diet options and/or meal plans.  RNCM advised daughter that patient should keep follow up appointments with her doctor, take her medications as prescribed, and report heart failure symptoms to her doctor.  RNCM discussed and offered [facility] care management services to daughter for patient. Daughter  verbally agreed to services.   ASSESSMENT: EMMI  Heart failure referral  PLAN; RNCM will refer patient to health coach for heart failure disease management with caregiver/daughter.

## 2015-07-30 NOTE — Telephone Encounter (Signed)
New message      Calling to report that from Monday the 10th to today, patient has gained 2 lbs.  Please advise

## 2015-07-30 NOTE — Telephone Encounter (Signed)
Returned call to daughter - patient has gained 2 lbs in 5 days Patient is feeling OK - patient has some swelling - she has been sitting for longer periods of time (riding in the car 5-6 hours at a time, sitting in church) Patient is taking all medications Advised we need to know if patient gains 3lbs in 24 hours or 5lbs in 1 week or if patient is symptomatic - short of breath w/weight gain, edema (LE, abdominal, etc) Advised to monitor sodium intake, rest w/legs elevated Routed to MD as Juluis Rainier  Patient's daughter has a reimbursement claim form for MD to fill out. Explained that MD is out of the office this week

## 2015-08-02 ENCOUNTER — Other Ambulatory Visit: Payer: Self-pay | Admitting: *Deleted

## 2015-08-02 MED ORDER — WARFARIN SODIUM 3 MG PO TABS: ORAL_TABLET | ORAL | Status: DC

## 2015-08-02 NOTE — Addendum Note (Signed)
Addended by: Quinn Plowman E on: 08/02/2015 10:40 PM   Modules accepted: Orders

## 2015-08-03 ENCOUNTER — Encounter: Payer: Self-pay | Admitting: Hematology and Oncology

## 2015-08-03 ENCOUNTER — Ambulatory Visit (HOSPITAL_BASED_OUTPATIENT_CLINIC_OR_DEPARTMENT_OTHER): Payer: PPO | Admitting: Hematology and Oncology

## 2015-08-03 ENCOUNTER — Telehealth: Payer: Self-pay | Admitting: Cardiovascular Disease

## 2015-08-03 VITALS — BP 93/53 | HR 66 | Temp 98.5°F | Resp 17 | Wt 181.6 lb

## 2015-08-03 DIAGNOSIS — R531 Weakness: Secondary | ICD-10-CM | POA: Diagnosis not present

## 2015-08-03 DIAGNOSIS — D0512 Intraductal carcinoma in situ of left breast: Secondary | ICD-10-CM

## 2015-08-03 DIAGNOSIS — C50112 Malignant neoplasm of central portion of left female breast: Secondary | ICD-10-CM

## 2015-08-03 NOTE — Telephone Encounter (Signed)
Spoke with daughter [name].  Patient missed warfarin Sunday and Monday, need to know if they should take extra.   Advised daughter to give extra 1/2 tablet for 2 days, then go back to normal schedule.  HH scheduled to check next INR in about a week.

## 2015-08-03 NOTE — Telephone Encounter (Signed)
New Message  Pt daughter call requesting to speak with RN about pt warfarin prescription. Please call back to discus

## 2015-08-03 NOTE — Progress Notes (Signed)
Patient Care Team: [name], MD as PCP - General (Internal Medicine) Eppie Gibson Pleasant, RN as [place] Management  We will see she is SUMMARY OF ONCOLOGIC HISTORY:   Cancer of central portion of left female breast ([place])   06/30/2014 Initial Diagnosis Left breast biopsy: DCIS ER 100%, PR 40%, 8.6 cm area of grouped segmental calcifications   07/30/2014 - 01/28/2015 Anti-estrogen oral therapy Anastrozole 1 mg every other day palliative treatment   03/29/2015 - 03/31/2015 Hospital Admission Hospitalization for generalized weakness, nausea and vomiting, syncope   06/24/2015 - 07/01/2015 Hospital Admission Hospitalization for atypical chest pain/NSTEMI    CHIEF COMPLIANT: Follow-up of left breast DCIS  INTERVAL HISTORY: [name] is a 82 year old with above-mentioned history of left breast DCIS who was on anastrozole therapy since July 2016 to January 2017. She was hospitalized in March for generalized weakness and in June for non-STEMI. She is currently in a wheelchair and looks fairly weak and frail. She reports of the breast feels fine and does not have any pain anymore. She is generally weak. She takes lots of vitamins and supplements as recommended by her grandson.  REVIEW OF SYSTEMS:   Constitutional: Denies fevers, chills or abnormal weight loss Eyes: Denies blurriness of vision Ears, nose, mouth, throat, and face: Denies mucositis or sore throat Respiratory: Denies cough, dyspnea or wheezes Cardiovascular: Recent acute coronary syndrome Gastrointestinal:  Denies nausea, heartburn or change in bowel habits Skin: Denies abnormal skin rashes Lymphatics: Denies new lymphadenopathy or easy bruising Neurological: Generalized weakness Behavioral/Psych: Mood is stable, no new changes  Extremities: 1+ lower extremity edema Breast: Does not complain of any pain in the breasts All other systems were reviewed with the patient and are negative.  I have reviewed  the past medical history, past surgical history, social history and family history with the patient and they are unchanged from previous note.  ALLERGIES:  is allergic to darvon; digoxin and related; penicillins; percocet; percodan; and vicodin.  MEDICATIONS:  Current Outpatient Prescriptions  Medication Sig Dispense Refill  . acetaminophen (TYLENOL) 500 MG tablet Take 500 mg by mouth every 6 (six) hours as needed (pain).    Marland Kitchen albuterol (PROAIR HFA) 108 (90 Base) MCG/ACT inhaler Inhale 2 puffs into the lungs every 6 (six) hours as needed for wheezing or shortness of breath.    Marland Kitchen aspirin EC 81 MG tablet Take 81 mg by mouth daily.    Marland Kitchen atropine 1 % ophthalmic solution Place 1 drop into both eyes daily as needed (burning eyes).   0  . Blood Glucose Monitoring Suppl (ONE TOUCH ULTRA 2) w/Device KIT Use to check blood sugar 2 times per day dx code E11.65 1 each 0  . cholecalciferol (VITAMIN D) 1000 units tablet Take 1,000 Units by mouth daily. Reported on 07/30/2015    . donepezil (ARICEPT) 5 MG tablet Take 5 mg by mouth at bedtime.   0  . folic acid (FOLVITE) 1 MG tablet take 1 tablet by mouth once daily 30 tablet 9  . furosemide (LASIX) 40 MG tablet Take 1 tablet (40 mg total) by mouth 2 (two) times daily. 60 tablet 3  . gabapentin (NEURONTIN) 100 MG capsule Take 100 mg by mouth 2 (two) times daily.     Marland Kitchen glipiZIDE (GLUCOTROL) 5 MG tablet TAKE ONE-HALF TABLET BY  MOUTH DAILY ONLY IF BLOOD  SUGAR IS OVER 200 (Patient taking differently: TAKE ONE-HALF TABLET BY  MOUTH DAILY ONLY IF BLOOD  SUGAR IS  300 OR MORE) 45 tablet 1  . guaifenesin (MUCUS RELIEF CHEST CONGESTION) 400 MG TABS tablet Take 400 mg by mouth 2 (two) times daily as needed (cough/ congestion).    Marland Kitchen levothyroxine (SYNTHROID, LEVOTHROID) 125 MCG tablet Half tablet daily 45 tablet 1  . loratadine (CLARITIN) 10 MG tablet Take 10 mg by mouth daily.    . meclizine (ANTIVERT) 25 MG tablet Take 25 mg by mouth 3 (three) times daily as needed for  dizziness.    . nitroGLYCERIN (NITRODUR - DOSED IN MG/24 HR) 0.2 mg/hr patch Place 1 patch (0.2 mg total) onto the skin daily. 30 patch 6  . omeprazole (PRILOSEC) 20 MG capsule Take 20 mg by mouth daily before breakfast.     . ONE TOUCH ULTRA TEST test strip TEST TWICE DAILY 100 each 2  . OVER THE COUNTER MEDICATION Take 1 Can by mouth daily. Enterex nutritional supplement - with choline    . OVER THE COUNTER MEDICATION Take 1 tablet by mouth 2 (two) times daily with a meal. FD Guard    . OVER THE COUNTER MEDICATION Take 1 tablet by mouth daily. Micro-algae DHA    . OVER THE COUNTER MEDICATION Take 1 capsule by mouth daily. Choline 100 mg, trimethylglycine 50 mg, glutathione 50 mg, methyltetrahydrofolate, l-arginine-, l-ornithine, l-citruline    . Polyvinyl Alcohol-Povidone (REFRESH OP) Place 1 drop into both eyes daily as needed (dry eyes).     . potassium chloride (K-DUR) 10 MEQ tablet take 1 tablet by mouth twice a day 60 tablet 6  . ranolazine ([name]) 500 MG 12 hr tablet Take 1 tablet (500 mg total) by mouth 2 (two) times daily. 180 tablet 3  . rosuvastatin (CRESTOR) 10 MG tablet Take 1 tablet (10 mg total) by mouth 3 (three) times a week. 30 tablet 3  . senna-docusate (SENOKOT S) 8.6-50 MG tablet Take 1 tablet by mouth 2 (two) times daily. For constipation 60 tablet 1  . sitaGLIPtin (JANUVIA) 50 MG tablet Take 1 tablet (50 mg total) by mouth daily. 30 tablet 1  . traMADol (ULTRAM) 50 MG tablet Take 1 tablet (50 mg total) by mouth every 6 (six) hours as needed for moderate pain. (Patient taking differently: Take 50 mg by mouth 2 (two) times daily as needed for moderate pain. ) 30 tablet 0  . warfarin (COUMADIN) 3 MG tablet Take 1 to 1.5 tablets by mouth daily or as directed by coumadin clinic 40 tablet 3  . zolpidem (AMBIEN) 5 MG tablet Take 0.5 tablets (2.5 mg total) by mouth at bedtime. sleep (Patient taking differently: Take 5 mg by mouth. sleep) 30 tablet 0   No current  facility-administered medications for this visit.    PHYSICAL EXAMINATION: ECOG PERFORMANCE STATUS: 1 - Symptomatic but completely ambulatory  Filed Vitals:   08/03/15 1501  BP: 93/53  Pulse: 66  Temp: 98.5 F (36.9 C)  Resp: 17   Filed Weights   08/03/15 1501  Weight: 181 lb 9.6 oz (82.373 kg)    GENERAL:alert, no distress and comfortable SKIN: skin color, texture, turgor are normal, no rashes or significant lesions EYES: normal, Conjunctiva are pink and non-injected, sclera clear OROPHARYNX:no exudate, no erythema and lips, buccal mucosa, and tongue normal  NECK: supple, thyroid normal size, non-tender, without nodularity LYMPH:  no palpable lymphadenopathy in the cervical, axillary or inguinal LUNGS: clear to auscultation and percussion with normal breathing effort HEART: regular rate & rhythm and no murmurs and no lower extremity edema ABDOMEN:abdomen soft, non-tender  and normal bowel sounds MUSCULOSKELETAL:no cyanosis of digits and no clubbing  NEURO: alert & oriented x 3 with fluent speech, no focal motor/sensory deficits EXTREMITIES: 1+ lower extremity edema  LABORATORY DATA:  I have reviewed the data as listed   Chemistry      Component Value Date/Time   NA 138 07/07/2015 1121   K 4.4 07/07/2015 1121   CL 97 07/07/2015 1121   CO2 35* 07/07/2015 1121   BUN 33* 07/07/2015 1121   CREATININE 1.68* 07/07/2015 1121   CREATININE 1.49* 06/08/2015 1119      Component Value Date/Time   CALCIUM 9.5 07/07/2015 1121   ALKPHOS 71 07/07/2015 1121   AST 15 07/07/2015 1121   ALT 9 07/07/2015 1121   BILITOT 0.9 07/07/2015 1121       Lab Results  Component Value Date   WBC 6.1 07/01/2015   HGB 9.7* 07/01/2015   HCT 30.8* 07/01/2015   MCV 89.0 07/01/2015   PLT 140* 07/01/2015   NEUTROABS 4.9 06/24/2015     ASSESSMENT & PLAN:  Cancer of central 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 Given her advanced age and multiple comorbidities, no further medical oncology follow-up is necessary. I also do not recommend any further mammograms because of her frail health and elderly age. Given that there was a progression we would not be doing any additional treatment so I do not see any point in obtaining additional mammograms.  No orders of the defined types were placed in this encounter.   The patient has a good understanding of the overall plan. she agrees with it. she will call with any problems that may develop before the next visit here.   Rulon Eisenmenger, MD 08/03/2015

## 2015-08-03 NOTE — Assessment & Plan Note (Signed)
DCIS left breast ER 100% PR 40%, 8.6 area of calcifications status post biopsy on 06/30/2014 Recommendation:Patient is not a candidate for surgery because of her CHF/pacemaker Current treatment: Anastrozole 1 mg daily started June 2016 discontinued on 12 2017 due to polypharmacy issues  Hospitalizations in March and June 2017  I discussed with the family that patient could not be seen on an as-needed basis. Given her advanced age and multiple comorbidities, no further medical oncology follow-up is necessary.

## 2015-08-05 ENCOUNTER — Other Ambulatory Visit: Payer: Self-pay

## 2015-08-05 ENCOUNTER — Encounter: Payer: Self-pay | Admitting: *Deleted

## 2015-08-05 NOTE — Patient Outreach (Signed)
[facility] [facility]) Care Management  08/05/2015  [name] 01/01/1921 [id number]   REFERRAL SOURCE:  EMMI heart failure program REFERRAL REASON:  EMMI heart failure RED alert for:  Filled new prescriptions  SUBJECTIVE: Telephone call to patients daughter/caregiver, Burnard Bunting.  HIPAA verified for patient by daughter/caregiver.  Daughter states patient had her coumadin refilled.  Daughter states patient was out of her coumadin for 2 days due to the pharmacy having to order it.  Daughter states she contacted the pharmacist with patients cardiology office. Daughter states she was advised by the pharmacist on how to catch patient up on her coumadin.  Daughter states patient is now on scheduled with her coumadin dose.  Daughter denies any other concerns regarding patients medications. Daughter states patient had blood work done this week.  Daughter states patient is complaining of her legs bothering her.  Daughter states patient has neuropathy and arthritis. Daughter states she is going to contact patients primary MD office today to notify them of patients legs hurting. Daughter states she recently purchased patients compression hose as advised by the doctor.  States patient is wearing her compression hose.  Daughter denies patient complaining of increased shortness of breath, chest discomfort or increased swelling. Daughter states patients weight this am was 177.5. Daughter states patients weight has remained relatively stable.  RNCM informed patients daughter/caregiver that [facility] care management health coach would be following up with her and patient for additional education / management. Telephone contact number given to daughter for [facility] care management.  Daughter advised to call for any questions/ concerns.  Daughter verbalized understanding and appreciation of the services.   PLAN; No additional follow up needed at this time by thisRNCM.  Patient to be followed by health coach.    Quinn Plowman RN,BSN,CCM [facility] Telephonic  [phone]

## 2015-08-09 ENCOUNTER — Encounter: Payer: Self-pay | Admitting: *Deleted

## 2015-08-09 LAB — PROTIME-INR: INR: 1.9 — AB (ref <=1.1)

## 2015-08-11 ENCOUNTER — Ambulatory Visit (INDEPENDENT_AMBULATORY_CARE_PROVIDER_SITE_OTHER): Payer: PPO | Admitting: Pharmacist Clinician (PhC)/ Clinical Pharmacy Specialist

## 2015-08-11 DIAGNOSIS — I824Z9 Acute embolism and thrombosis of unspecified deep veins of unspecified distal lower extremity: Secondary | ICD-10-CM

## 2015-08-11 DIAGNOSIS — Z7901 Long term (current) use of anticoagulants: Secondary | ICD-10-CM

## 2015-08-11 NOTE — Telephone Encounter (Signed)
Patient's daughter, [name] [facility] brought in reimbursement claim for scale. Left message that paperwork is ready for pick-up and will be placed at the front.

## 2015-08-13 ENCOUNTER — Ambulatory Visit (INDEPENDENT_AMBULATORY_CARE_PROVIDER_SITE_OTHER): Payer: PPO | Admitting: Cardiology

## 2015-08-13 ENCOUNTER — Encounter: Payer: Self-pay | Admitting: Cardiology

## 2015-08-13 DIAGNOSIS — R778 Other specified abnormalities of plasma proteins: Secondary | ICD-10-CM

## 2015-08-13 DIAGNOSIS — I950 Idiopathic hypotension: Secondary | ICD-10-CM | POA: Diagnosis not present

## 2015-08-13 DIAGNOSIS — I441 Atrioventricular block, second degree: Secondary | ICD-10-CM | POA: Diagnosis not present

## 2015-08-13 DIAGNOSIS — N183 Chronic kidney disease, stage 3 unspecified: Secondary | ICD-10-CM

## 2015-08-13 DIAGNOSIS — R079 Chest pain, unspecified: Secondary | ICD-10-CM | POA: Diagnosis not present

## 2015-08-13 DIAGNOSIS — Z7189 Other specified counseling: Secondary | ICD-10-CM

## 2015-08-13 DIAGNOSIS — R7989 Other specified abnormal findings of blood chemistry: Secondary | ICD-10-CM

## 2015-08-13 MED ORDER — DICLOFENAC SODIUM 1 % TD GEL
4.0000 g | Freq: Three times a day (TID) | TRANSDERMAL | 3 refills | Status: DC
Start: 2015-08-13 — End: 2016-04-25

## 2015-08-13 NOTE — Assessment & Plan Note (Signed)
Post generator change June 2017- medical Rx only

## 2015-08-13 NOTE — Progress Notes (Signed)
08/13/2015 [name]   Dec 08, 1920  [id number]  Primary Physician [name], MD Primary Cardiologist: Dr Sallyanne Kuster  HPI:  82 year old female with a prior history of coronary artery disease status post remote CABG. She also has history of paroxysmal atrial fibrillation, chronic Coumadin Rx, hypertension, hyperlipidemia, DM II, CKD II-III, and 2nd deg AVB s/p PPM. She was recently admitted for generator change out in early June and was admitted 2 days later with discomfort around the pacemaker incision site as well as reproducible midsternal chest discomfort that was worse with palpation. She initially had mild troponin elevation and it subsequently rose to 6.82. Our team saw her in the hospital. Her pacemaker was functioning normally. With her history of advanced age, chronic kidney disease, and wishes by her and her family to avoid invasive evaluation, medical therapy was recommended. Echocardiogram was performed and did show stable, mild LV dysfunction with an EF of 45-50%. She was subsequently discharged and since then, has been doing relatively well. She saw Ignacia Bayley in follow up 07/13/15. She is in the office today for a one month follow up. She is basically wheel chair bound. She has pain in her Rt knee and some atypical,localized Lt chest pain.    Current Outpatient Prescriptions  Medication Sig Dispense Refill  . acetaminophen (TYLENOL) 500 MG tablet Take 500 mg by mouth every 6 (six) hours as needed (pain).    Marland Kitchen albuterol (PROAIR HFA) 108 (90 Base) MCG/ACT inhaler Inhale 2 puffs into the lungs every 6 (six) hours as needed for wheezing or shortness of breath.    Marland Kitchen aspirin EC 81 MG tablet Take 81 mg by mouth daily.    Marland Kitchen atropine 1 % ophthalmic solution Place 1 drop into both eyes daily as needed (burning eyes).   0  . Blood Glucose Monitoring Suppl (ONE TOUCH ULTRA 2) w/Device KIT Use to check blood sugar 2 times per day dx code E11.65 1 each 0  . cholecalciferol (VITAMIN D)  1000 units tablet Take 1,000 Units by mouth daily. Reported on 07/30/2015    . donepezil (ARICEPT) 5 MG tablet Take 5 mg by mouth at bedtime.   0  . folic acid (FOLVITE) 1 MG tablet take 1 tablet by mouth once daily 30 tablet 9  . furosemide (LASIX) 40 MG tablet Take 1 tablet (40 mg total) by mouth 2 (two) times daily. 60 tablet 3  . gabapentin (NEURONTIN) 100 MG capsule Take 100 mg by mouth 2 (two) times daily.     Marland Kitchen glipiZIDE (GLUCOTROL) 5 MG tablet TAKE ONE-HALF TABLET BY  MOUTH DAILY ONLY IF BLOOD  SUGAR IS OVER 200 (Patient taking differently: TAKE ONE-HALF TABLET BY  MOUTH DAILY ONLY IF BLOOD  SUGAR IS 300 OR MORE) 45 tablet 1  . guaifenesin (MUCUS RELIEF CHEST CONGESTION) 400 MG TABS tablet Take 400 mg by mouth 2 (two) times daily as needed (cough/ congestion).    Marland Kitchen levothyroxine (SYNTHROID, LEVOTHROID) 125 MCG tablet Half tablet daily 45 tablet 1  . loratadine (CLARITIN) 10 MG tablet Take 10 mg by mouth daily.    . meclizine (ANTIVERT) 25 MG tablet Take 25 mg by mouth 3 (three) times daily as needed for dizziness.    . nitroGLYCERIN (NITRODUR - DOSED IN MG/24 HR) 0.2 mg/hr patch Place 1 patch (0.2 mg total) onto the skin daily. 30 patch 6  . omeprazole (PRILOSEC) 20 MG capsule Take 20 mg by mouth daily before breakfast.     . ONE  TOUCH ULTRA TEST test strip TEST TWICE DAILY 100 each 2  . OVER THE COUNTER MEDICATION Take 1 Can by mouth daily. Enterex nutritional supplement - with choline    . OVER THE COUNTER MEDICATION Take 1 tablet by mouth 2 (two) times daily with a meal. FD Guard    . OVER THE COUNTER MEDICATION Take 1 tablet by mouth daily. Micro-algae DHA    . OVER THE COUNTER MEDICATION Take 1 capsule by mouth daily. Choline 100 mg, trimethylglycine 50 mg, glutathione 50 mg, methyltetrahydrofolate, l-arginine-, l-ornithine, l-citruline    . Polyvinyl Alcohol-Povidone (REFRESH OP) Place 1 drop into both eyes daily as needed (dry eyes).     . potassium chloride (K-DUR) 10 MEQ tablet take  1 tablet by mouth twice a day 60 tablet 6  . ranolazine ([name]) 500 MG 12 hr tablet Take 1 tablet (500 mg total) by mouth 2 (two) times daily. 180 tablet 3  . rosuvastatin (CRESTOR) 10 MG tablet Take 1 tablet (10 mg total) by mouth 3 (three) times a week. 30 tablet 3  . senna-docusate (SENOKOT S) 8.6-50 MG tablet Take 1 tablet by mouth 2 (two) times daily. For constipation 60 tablet 1  . sitaGLIPtin (JANUVIA) 50 MG tablet Take 1 tablet (50 mg total) by mouth daily. 30 tablet 1  . traMADol (ULTRAM) 50 MG tablet Take 1 tablet (50 mg total) by mouth every 6 (six) hours as needed for moderate pain. (Patient taking differently: Take 50 mg by mouth 2 (two) times daily as needed for moderate pain. ) 30 tablet 0  . warfarin (COUMADIN) 3 MG tablet Take 1 to 1.5 tablets by mouth daily or as directed by coumadin clinic 40 tablet 3  . zolpidem (AMBIEN) 5 MG tablet Take 0.5 tablets (2.5 mg total) by mouth at bedtime. sleep (Patient taking differently: Take 5 mg by mouth. sleep) 30 tablet 0  . diclofenac sodium (VOLTAREN) 1 % GEL Apply 4 g topically 3 (three) times daily. To right knee 1 Tube 3   No current facility-administered medications for this visit.     Allergies  Allergen Reactions  . Darvon Other (See Comments)    confusion  . Digoxin And Related Nausea Only  . Penicillins Other (See Comments)    Was told had allergy from childhood... Unknown reaction  . Percocet [Oxycodone-Acetaminophen] Other (See Comments)    confusion  . Percodan [Oxycodone-Aspirin] Other (See Comments)    confusion  . Vicodin [Hydrocodone-Acetaminophen] Other (See Comments)    confusion    Social History   Social History  . Marital status: Widowed    Spouse name: N/A  . Number of children: 10  . Years of education: N/A   Occupational History  .  Retired   Social History Main Topics  . Smoking status: Never Smoker  . Smokeless tobacco: Never Used  . Alcohol use No  . Drug use: No  . Sexual activity: No    Other Topics Concern  . Not on file   Social History Narrative  . No narrative on file     Review of Systems: General: negative for chills, fever, night sweats or weight changes.  Cardiovascular: negative for chest pain, dyspnea on exertion, edema, orthopnea, palpitations, paroxysmal nocturnal dyspnea or shortness of breath Dermatological: negative for rash Respiratory: negative for cough or wheezing Urologic: negative for hematuria Abdominal: negative for nausea, vomiting, diarrhea, bright red blood per rectum, melena, or hematemesis Neurologic: negative for visual changes, syncope, or dizziness All other systems reviewed  and are otherwise negative except as noted above.    Blood pressure 94/65, height _0  (1.626 m), weight 175 lb (79.4 kg), SpO2 96 %.  General appearance: alert, cooperative, no distress, mildly obese and in wheel chair, HOH Neck: no JVD Lungs: clear to auscultation bilaterally Heart: regular rate and rhythm Extremities: trace to 1+ edema Skin: Skin color, texture, turgor normal. No rashes or lesions Neurologic: Grossly normal   ASSESSMENT AND PLAN:   Chest pain with low risk of acute coronary syndrome Atypical, localized chest pain  2Nd degree atrioventricular block MDT PTVDP with generator change June 2017  Elevated troponin I level Post generator change June 2017- medical Rx only  Hypotension Chronic- systolic in 46'N  CKD (chronic kidney disease), stage III Last SCr 1.68  Hx of CABG-CABG in 1994. Myoview abnormal Aug 2015- medical Rx Recent NSTEMI by Troponin, atypical chest pain- medical Rx  DNR (do not resuscitate) discussion Significantly debilitated, wheel chair bound.   PLAN  [name] samples provided. Family is concerned about low B/P but this is chronic and stable. I suggested they hold her Lasix if her systolic drops below 90, it pretty consistently 94-100. I suggested she could try Diclofenac topical for her knee pain. She has a  f/u with Dr Loletha Grayer in Sept.  Kerin Ransom PA-C 08/13/2015 12:54 PM

## 2015-08-13 NOTE — Assessment & Plan Note (Signed)
Last SCr 1.68

## 2015-08-13 NOTE — Assessment & Plan Note (Signed)
Significantly debilitated, wheel chair bound.

## 2015-08-13 NOTE — Assessment & Plan Note (Signed)
MDT PTVDP with generator change June 2017

## 2015-08-13 NOTE — Assessment & Plan Note (Signed)
Chronic- systolic in [id number]

## 2015-08-13 NOTE — Assessment & Plan Note (Signed)
Recent NSTEMI by Troponin, atypical chest pain- medical Rx

## 2015-08-13 NOTE — Assessment & Plan Note (Signed)
Atypical, localized chest pain

## 2015-08-13 NOTE — Patient Instructions (Signed)
Your physician has recommended you make the following change in your medication:   1.) Diclofenac gel prescription has been sent to your pharmacy. Use as directed on the tube.  Keep September 29th appointment with Dr . Sallyanne Kuster.

## 2015-08-17 ENCOUNTER — Telehealth: Payer: Self-pay | Admitting: Cardiovascular Disease

## 2015-08-17 NOTE — Telephone Encounter (Addendum)
Received call from daughter requesting for paperwork at the front desk be faxed to insurance company instead of her coming to pick it up.    Paperwork located at front desk.  Will fax to [employer] @ [phone]  Fax sent to [facility] [phone] per request.  Called patient to make aware.

## 2015-08-17 NOTE — Telephone Encounter (Signed)
New message   Pt verbalized that [name] stated she would fax paperwork to her insurance company and she states that [employer] stated that they have not received it yet please call pt

## 2015-08-23 ENCOUNTER — Telehealth: Payer: Self-pay | Admitting: Endocrinology

## 2015-08-23 ENCOUNTER — Ambulatory Visit (INDEPENDENT_AMBULATORY_CARE_PROVIDER_SITE_OTHER): Payer: PPO | Admitting: Pharmacist Clinician (PhC)/ Clinical Pharmacy Specialist

## 2015-08-23 ENCOUNTER — Telehealth: Payer: Self-pay

## 2015-08-23 DIAGNOSIS — Z7901 Long term (current) use of anticoagulants: Secondary | ICD-10-CM

## 2015-08-23 LAB — POCT INR: INR: 2.1

## 2015-08-23 NOTE — Telephone Encounter (Signed)
Called and spoke with daughter about mishap with medication per Dr.Kumar, he advised to skip a day and start back with the half a tabs. Daughter stated she would start that tomorrow. No other questions or concerns.

## 2015-08-23 NOTE — Telephone Encounter (Signed)
I contacted the pt's daughter [name] and advised of note below. She voiced understanding and had no further questions at this time.

## 2015-08-23 NOTE — Telephone Encounter (Signed)
Just skip one day and then go back to half tablet daily

## 2015-08-23 NOTE — Telephone Encounter (Signed)
Caregiver just realized she was giving her the wrong dosage she was giving her a full pill of the 125 synthroid when she was supposed to be doing the 1/2 pill please advise on if this is a problem and what she needs to do to fix it

## 2015-08-24 ENCOUNTER — Telehealth: Payer: Self-pay | Admitting: Cardiovascular Disease

## 2015-08-24 NOTE — Telephone Encounter (Signed)
Returned call. Spoke to [place]. She states the claim form submitted to DME supplier for patient requires a CPT code and also a receipt for reimbursement. She will get the receipt to them. Will investigate on CPT code requirement and follow up.

## 2015-08-24 NOTE — Telephone Encounter (Signed)
New message  Pt daughter call requesting to speak with RN about a reimburse claim form. Pt daughter also states pt has a cut. She states its not bleeding bad but would like to know if there is something that she could do for it Please call back to discuss

## 2015-08-24 NOTE — Telephone Encounter (Signed)
Correction upon chart review: paperwork is to be sent to [employer], pt's insurer.

## 2015-08-25 NOTE — Telephone Encounter (Signed)
Routing to billing dept to locate CPT code. Forms are scanned in patient's chart under media - thanks!

## 2015-08-26 NOTE — Telephone Encounter (Signed)
I will contact the patient's daughter regarding this.  We are not comfortable with coding DME.  I will suggest that she contact the company from which it was purchased, they should be able to assist her.

## 2015-09-02 ENCOUNTER — Other Ambulatory Visit: Payer: Self-pay | Admitting: *Deleted

## 2015-09-02 NOTE — Patient Outreach (Signed)
[facility]) Care Management  09/02/2015  [name] February 25, 1920 [id number]    RN Health Coach attempted #1   outreach call to patient.  Patient was unavailable.No voicemail message was left.Line was busy and voicemail pick up.  Plan: RN will call patient again within 14 days.     [facility] [phone]

## 2015-09-03 NOTE — Telephone Encounter (Signed)
I have attempted to find a DME code for the bedside scale the patient has purchased.  The company it was ordered from could not offer any assistance.  I have also reached out to other contacts that have experience with DME, with no success.  Unfortunately the patient will probably not be compensated for this item.

## 2015-09-07 ENCOUNTER — Ambulatory Visit: Payer: PPO | Admitting: Gastroenterology

## 2015-09-09 ENCOUNTER — Encounter: Payer: Self-pay | Admitting: Gastroenterology

## 2015-09-09 ENCOUNTER — Ambulatory Visit (INDEPENDENT_AMBULATORY_CARE_PROVIDER_SITE_OTHER): Payer: PPO | Admitting: Gastroenterology

## 2015-09-09 VITALS — BP 132/74 | HR 80 | Ht 64.0 in | Wt 163.0 lb

## 2015-09-09 DIAGNOSIS — E876 Hypokalemia: Secondary | ICD-10-CM | POA: Diagnosis not present

## 2015-09-09 DIAGNOSIS — K5900 Constipation, unspecified: Secondary | ICD-10-CM | POA: Diagnosis not present

## 2015-09-09 DIAGNOSIS — R630 Anorexia: Secondary | ICD-10-CM

## 2015-09-09 DIAGNOSIS — R1013 Epigastric pain: Secondary | ICD-10-CM

## 2015-09-09 MED ORDER — LUBIPROSTONE 8 MCG PO CAPS: 8.0000 ug | ORAL_CAPSULE | Freq: Two times a day (BID) | ORAL | 3 refills | Status: DC

## 2015-09-09 NOTE — Progress Notes (Signed)
[name]    [id number]    25-Aug-1920  Primary Care Physician:[name] Wilhelmina Mcardle, MD  Referring Physician: [name], MD [address] [place], [place] [address]  Chief complaint:  Dyspepsia, anorexia, weight loss  HPI:  82 year old African-American female here for follow up visit, was last seen in office in April 2017 by Nicoletta Ba, is accompanied by her daughter . She has had chronic GI complaints for years. She does have history of a gastric ulcer in 2013. Also with history of recent diagnosis of breast cancer now being on maintained on a Rheumatrex. She has atrial fibrillation, adult-onset diabetes mellitus, cardiomyopathy with EF of 30-35% and is status post pacemaker placement. She is status post CABG in 1994 and is maintained on chronic anticoagulation with Coumadin. Per daughter she has become more picky and doesn't have much appetite. Denies any dysphagia, odynophagia, nausea, vomiting, abdominal pain, melena or bright red blood per rectum She also has chronic constipation and has maintained a regimen with mag citrate once a week for many years, but hasnt been very effective recently. Daughter also brought along what appears to be un dissolved tablets likely potassium   Outpatient Encounter Prescriptions as of 09/09/2015  Medication Sig  . acetaminophen (TYLENOL) 500 MG tablet Take 500 mg by mouth every 6 (six) hours as needed (pain).  Marland Kitchen albuterol (PROAIR HFA) 108 (90 Base) MCG/ACT inhaler Inhale 2 puffs into the lungs every 6 (six) hours as needed for wheezing or shortness of breath.  Marland Kitchen aspirin EC 81 MG tablet Take 81 mg by mouth daily.  Marland Kitchen atropine 1 % ophthalmic solution Place 1 drop into both eyes daily as needed (burning eyes).   . Blood Glucose Monitoring Suppl (ONE TOUCH ULTRA 2) w/Device KIT Use to check blood sugar 2 times per day dx code E11.65  . cholecalciferol (VITAMIN D) 1000 units tablet Take 1,000 Units by mouth daily. Reported on  07/30/2015  . diclofenac sodium (VOLTAREN) 1 % GEL Apply 4 g topically 3 (three) times daily. To right knee  . donepezil (ARICEPT) 5 MG tablet Take 5 mg by mouth at bedtime.   . folic acid (FOLVITE) 1 MG tablet take 1 tablet by mouth once daily  . furosemide (LASIX) 40 MG tablet Take 1 tablet (40 mg total) by mouth 2 (two) times daily.  Marland Kitchen gabapentin (NEURONTIN) 100 MG capsule Take 100 mg by mouth 2 (two) times daily.   Marland Kitchen glipiZIDE (GLUCOTROL) 5 MG tablet TAKE ONE-HALF TABLET BY  MOUTH DAILY ONLY IF BLOOD  SUGAR IS OVER 200 (Patient taking differently: TAKE ONE-HALF TABLET BY  MOUTH DAILY ONLY IF BLOOD  SUGAR IS 300 OR MORE)  . guaifenesin (MUCUS RELIEF CHEST CONGESTION) 400 MG TABS tablet Take 400 mg by mouth 2 (two) times daily as needed (cough/ congestion).  Marland Kitchen levothyroxine (SYNTHROID, LEVOTHROID) 125 MCG tablet Half tablet daily  . loratadine (CLARITIN) 10 MG tablet Take 10 mg by mouth daily.  . meclizine (ANTIVERT) 25 MG tablet Take 25 mg by mouth 3 (three) times daily as needed for dizziness.  . nitroGLYCERIN (NITRODUR - DOSED IN MG/24 HR) 0.2 mg/hr patch Place 1 patch (0.2 mg total) onto the skin daily.  Marland Kitchen omeprazole (PRILOSEC) 20 MG capsule Take 20 mg by mouth daily before breakfast.   . ONE TOUCH ULTRA TEST test strip TEST TWICE DAILY  . OVER THE COUNTER MEDICATION Take 1 Can by mouth daily. Enterex nutritional supplement -  with choline  . OVER THE COUNTER MEDICATION Take 1 tablet by mouth 2 (two) times daily with a meal. FD Guard  . OVER THE COUNTER MEDICATION Take 1 tablet by mouth daily. Micro-algae DHA  . OVER THE COUNTER MEDICATION Take 1 capsule by mouth daily. Choline 100 mg, trimethylglycine 50 mg, glutathione 50 mg, methyltetrahydrofolate, l-arginine-, l-ornithine, l-citruline  . Polyvinyl Alcohol-Povidone (REFRESH OP) Place 1 drop into both eyes daily as needed (dry eyes).   . potassium chloride (K-DUR) 10 MEQ tablet take 1 tablet by mouth twice a day  . ranolazine ([name]) 500  MG 12 hr tablet Take 1 tablet (500 mg total) by mouth 2 (two) times daily.  . rosuvastatin (CRESTOR) 10 MG tablet Take 1 tablet (10 mg total) by mouth 3 (three) times a week.  . senna-docusate (SENOKOT S) 8.6-50 MG tablet Take 1 tablet by mouth 2 (two) times daily. For constipation  . sitaGLIPtin (JANUVIA) 50 MG tablet Take 1 tablet (50 mg total) by mouth daily.  . traMADol (ULTRAM) 50 MG tablet Take 1 tablet (50 mg total) by mouth every 6 (six) hours as needed for moderate pain. (Patient taking differently: Take 50 mg by mouth 2 (two) times daily as needed for moderate pain. )  . warfarin (COUMADIN) 3 MG tablet Take 1 to 1.5 tablets by mouth daily or as directed by coumadin clinic  . zolpidem (AMBIEN) 5 MG tablet Take 0.5 tablets (2.5 mg total) by mouth at bedtime. sleep (Patient taking differently: Take 5 mg by mouth. sleep)   No facility-administered encounter medications on file as of 09/09/2015.     Allergies as of 09/09/2015 - Review Complete 08/13/2015  Allergen Reaction Noted  . Darvon Other (See Comments) 03/14/2011  . Digoxin and related Nausea Only 04/22/2012  . Penicillins Other (See Comments) 12/17/2011  . Percocet [oxycodone-acetaminophen] Other (See Comments) 03/14/2011  . Percodan [oxycodone-aspirin] Other (See Comments) 09/12/2011  . Vicodin [hydrocodone-acetaminophen] Other (See Comments) 12/18/2011    Past Medical History:  Diagnosis Date  . 2Nd degree atrioventricular block    a. 06/22/15 Gen change: MDT ADDR01 Adapta, DC PPM (ser# [id number]).  . Allergy to perfume   . Anemia   . Arthritis   . Chronic combined systolic and diastolic CHF (congestive heart failure) ([facility])    a. 01/2014 Echo: EF @ least mod-sev reduced with HK of lat/apical, basal inf walls. basalpost AK, Gr 1DD; b. 06/2015 Echo: EF 45-50%, Gr2 DD, mod LVH.   . CKD (chronic kidney disease), stage III    a. iii - iv.  . Coronary artery disease    a. 1994 s/p cabg;  b. 09/2013 MV: large, sev intensity,  partially reversible inf, apical defect, prior inf/ap infarct w/ mild peri-infarct ischemia->Med Rx; c. 06/2015 NSTEMI (trop 6)->Med rx.  . Daily headache   . DVT of upper extremity (deep vein thrombosis) ([facility]) 06/13/2012   BUE  . Dyspepsia   . Gastric ulcer   . GERD (gastroesophageal reflux disease)   . H/O: GI bleed 12//13  . Hiatal hernia   . High cholesterol   . History of blood transfusion 2013  . Hypertensive heart disease   . Hypothyroid   . Legally blind   . PAF (paroxysmal atrial fibrillation) ([facility])    a. CHA2DS2VASc = 7-->coumadin.  . Presence of permanent cardiac pacemaker    a. 06/22/15 Gen change: MDT ADDR01 Adapta, DC PPM (ser# [id number]).  . Seasonal allergies   . Type II diabetes mellitus ([facility])   . UTI (  lower urinary tract infection)     Past Surgical History:  Procedure Laterality Date  . CARDIAC CATHETERIZATION  10/25/92  . CARDIAC CATHETERIZATION  11/18/03   w/grafts 100%CX LAD 80 & 100%  . CARDIAC CATHETERIZATION  01/24/05   diffuse disease of native vessels  . CARDIAC CATHETERIZATION  06/06/06   severe native CAD  . CARDIOVERSION  06/06/06   successful  . CATARACT EXTRACTION W/ INTRAOCULAR LENS  IMPLANT, BILATERAL Bilateral   . CORONARY ARTERY BYPASS GRAFT  10/27/92   LIMA to LAD,SVG to LAD second diagonal,obtuse maraginal of the CX and posterior descendingbranch of the RCA  . EP IMPLANTABLE DEVICE N/A 06/22/2015   Procedure: PPM/BIV PPM Generator Changeout;  Surgeon: Sanda Klein, MD;  Location: [facility];  Service: Cardiovascular;  Laterality: N/A;  . ESOPHAGOGASTRODUODENOSCOPY  12/22/2011   Procedure: ESOPHAGOGASTRODUODENOSCOPY (EGD);  Surgeon: Gatha Mayer, MD;  Location: [facility];  Service: Endoscopy;  Laterality: N/A;  . INSERT / REPLACE / REMOVE PACEMAKER  06/29/2006   Medtronic adapta  . REFRACTIVE SURGERY Bilateral     Family History  Problem Relation Age of Onset  . Breast cancer Daughter   . Diabetes Son   . Diabetes Daughter   .  Heart disease Father   . Diabetes Father   . Colon polyps Daughter   . Heart attack Brother   . Diabetes Brother   . Stroke Sister   . Colon cancer Neg Hx     Social History   Social History  . Marital status: Widowed    Spouse name: N/A  . Number of children: 10  . Years of education: N/A   Occupational History  .  Retired   Social History Main Topics  . Smoking status: Never Smoker  . Smokeless tobacco: Never Used  . Alcohol use No  . Drug use: No  . Sexual activity: No   Other Topics Concern  . Not on file   Social History Narrative  . No narrative on file      Review of systems: Review of Systems  Constitutional: Negative for fever and chills.  HENT: Negative.   Eyes: Negative for blurred vision.  Respiratory: Negative for cough, shortness of breath and wheezing.   Cardiovascular: Negative for chest pain and palpitations.  Gastrointestinal: as per HPI Genitourinary: Negative for dysuria, urgency, frequency and hematuria.  Musculoskeletal: Positive for myalgias, back pain and joint pain.  Skin: Negative for itching and rash.  Neurological: Negative for dizziness, tremors, focal weakness, seizures and loss of consciousness.  Endo/Heme/Allergies: Positive for seasonal allergies.  Psychiatric/Behavioral: Negative for depression, suicidal ideas and hallucinations.  All other systems reviewed and are negative.   Physical Exam: Vitals:   09/09/15 1442  BP: 132/74  Pulse: 80   Body mass index is 27.98 kg/m. Gen:      No acute distress, in wheel chair HEENT:  EOMI, sclera anicteric Neck:     No masses; no thyromegaly Lungs:    Clear to auscultation bilaterally; normal respiratory effort CV:         Regular rate and rhythm; no murmurs Abd:      + bowel sounds; soft, non-tender; no palpable masses, no distension Ext:    No edema; adequate peripheral perfusion Skin:      Warm and dry; no rash Neuro: alert and oriented x 3 Psych: normal mood and  affect  Data Reviewed:  Reviewed labs, radiologic imaging, old records and pertinent past GI work up  Assessment and Plan/Recommendations:  35 yr F with multiple comorbidities including Breast ca, CHF here for follow up with c/o anorexia, chronic constipation, dyspepsia and bloating Start Probiotic, Align 1 capsule daily FD guard as needed for dyspepsia symptoms Start Amitiza 39mg  1 capsule BID Reassured daughter that she is passing likely Potassium extended release tablets, esp when she takes the Mag citrate causing diarrhea. Advised to stop Mag Citrate F/u BMP Small frequent meals Return as needed  25 minutes was spent face-to-face with the patient. Greater than 50% of the time used for counseling as well as treatment plan and follow-up. Patient's daughter had multiple questions which were answered to her satisfaction  K. VDenzil Magnuson, MD [phone]-Fri 8a-5p [phone] 5p, weekends, holidays  CC: DRogers Blocker MD

## 2015-09-09 NOTE — Patient Instructions (Addendum)
Use Align once daily  Use FDgard 1 twice a day  We have sent Amitiza to your pharmacy   Follow up as needed  You will need a BMET drawn in our lab

## 2015-09-10 ENCOUNTER — Telehealth: Payer: Self-pay | Admitting: *Deleted

## 2015-09-10 NOTE — Telephone Encounter (Signed)
Patient needs a Secretary/administrator in our lab

## 2015-09-14 ENCOUNTER — Ambulatory Visit (INDEPENDENT_AMBULATORY_CARE_PROVIDER_SITE_OTHER): Payer: PPO | Admitting: Pharmacist

## 2015-09-14 DIAGNOSIS — Z7901 Long term (current) use of anticoagulants: Secondary | ICD-10-CM

## 2015-09-14 LAB — POCT INR: INR: 6.1

## 2015-09-16 ENCOUNTER — Other Ambulatory Visit: Payer: Self-pay | Admitting: *Deleted

## 2015-09-16 NOTE — Telephone Encounter (Signed)
Called patient to inform she needs a BMET drawn per Dr Silverio Decamp     Orders are in... Patient will come in next week to have them drawn. She will discuss with her daughter

## 2015-09-16 NOTE — Patient Outreach (Incomplete)
RN Health Coach telephone call to patient.  [employer] compliance verified.

## 2015-09-17 ENCOUNTER — Ambulatory Visit (INDEPENDENT_AMBULATORY_CARE_PROVIDER_SITE_OTHER): Payer: PPO | Admitting: Pharmacist

## 2015-09-17 DIAGNOSIS — Z7901 Long term (current) use of anticoagulants: Secondary | ICD-10-CM

## 2015-09-17 LAB — POCT INR: INR: 2.8

## 2015-09-19 ENCOUNTER — Other Ambulatory Visit: Payer: Self-pay | Admitting: Endocrinology

## 2015-09-23 ENCOUNTER — Ambulatory Visit (INDEPENDENT_AMBULATORY_CARE_PROVIDER_SITE_OTHER): Payer: PPO | Admitting: Pharmacist Clinician (PhC)/ Clinical Pharmacy Specialist

## 2015-09-23 DIAGNOSIS — Z7901 Long term (current) use of anticoagulants: Secondary | ICD-10-CM

## 2015-09-23 LAB — POCT INR: INR: 2.3

## 2015-09-29 ENCOUNTER — Other Ambulatory Visit: Payer: Self-pay | Admitting: Cardiovascular Disease

## 2015-10-06 ENCOUNTER — Telehealth: Payer: Self-pay | Admitting: Gastroenterology

## 2015-10-07 ENCOUNTER — Ambulatory Visit (INDEPENDENT_AMBULATORY_CARE_PROVIDER_SITE_OTHER): Payer: PPO | Admitting: Pharmacist

## 2015-10-07 DIAGNOSIS — Z7901 Long term (current) use of anticoagulants: Secondary | ICD-10-CM

## 2015-10-07 LAB — POCT INR: INR: 2

## 2015-10-07 NOTE — Telephone Encounter (Signed)
Due BMET from 09/10/15.  Called back. Voicemail. Left message to call back.

## 2015-10-07 NOTE — Telephone Encounter (Signed)
Spoke with the daughter. The patient is passing her potassium tablet in her stool. She was told by another doctor she is not absorbing it. No recent labs. Potassium is prescribed by Dr Marlou Sa. Concerned because her stools are loose. Wants her mother to be seen. Appointment made.

## 2015-10-14 ENCOUNTER — Ambulatory Visit: Payer: PPO | Admitting: Gastroenterology

## 2015-10-14 ENCOUNTER — Other Ambulatory Visit (INDEPENDENT_AMBULATORY_CARE_PROVIDER_SITE_OTHER): Payer: PPO

## 2015-10-14 DIAGNOSIS — E876 Hypokalemia: Secondary | ICD-10-CM | POA: Diagnosis not present

## 2015-10-14 LAB — BASIC METABOLIC PANEL
BUN: 25 mg/dL — AB (ref 6–23)
CO2: 33 mEq/L — ABNORMAL HIGH (ref 19–32)
Calcium: 9 mg/dL (ref 8.4–10.5)
Chloride: 105 mEq/L (ref 96–112)
Creatinine, Ser: 1.27 mg/dL — ABNORMAL HIGH (ref 0.40–1.20)
GFR: 50.47 mL/min — AB (ref >60.00)
Glucose, Bld: 119 mg/dL — ABNORMAL HIGH (ref 70–99)
POTASSIUM: 4.3 meq/L (ref 3.5–5.1)
SODIUM: 144 meq/L (ref 135–145)

## 2015-10-15 ENCOUNTER — Encounter: Payer: Self-pay | Admitting: Cardiovascular Disease

## 2015-10-15 ENCOUNTER — Ambulatory Visit (INDEPENDENT_AMBULATORY_CARE_PROVIDER_SITE_OTHER): Payer: PPO | Admitting: Cardiovascular Disease

## 2015-10-15 VITALS — BP 95/65 | HR 76 | Ht 64.0 in | Wt 163.5 lb

## 2015-10-15 DIAGNOSIS — Z950 Presence of cardiac pacemaker: Secondary | ICD-10-CM

## 2015-10-15 DIAGNOSIS — I441 Atrioventricular block, second degree: Secondary | ICD-10-CM

## 2015-10-15 DIAGNOSIS — Z79899 Other long term (current) drug therapy: Secondary | ICD-10-CM | POA: Diagnosis not present

## 2015-10-15 DIAGNOSIS — E785 Hyperlipidemia, unspecified: Secondary | ICD-10-CM

## 2015-10-15 DIAGNOSIS — I5042 Chronic combined systolic (congestive) and diastolic (congestive) heart failure: Secondary | ICD-10-CM

## 2015-10-15 DIAGNOSIS — I25708 Atherosclerosis of coronary artery bypass graft(s), unspecified, with other forms of angina pectoris: Secondary | ICD-10-CM

## 2015-10-15 DIAGNOSIS — I480 Paroxysmal atrial fibrillation: Secondary | ICD-10-CM

## 2015-10-15 DIAGNOSIS — I442 Atrioventricular block, complete: Secondary | ICD-10-CM

## 2015-10-15 MED ORDER — POTASSIUM CHLORIDE ER 8 MEQ PO TBCR: 8.0000 meq | EXTENDED_RELEASE_TABLET | Freq: Every day | ORAL | 11 refills | Status: DC

## 2015-10-15 MED ORDER — NITROGLYCERIN 0.1 MG/HR TD PT24: 0.1000 mg | MEDICATED_PATCH | Freq: Every day | TRANSDERMAL | 11 refills | Status: DC

## 2015-10-15 MED ORDER — FUROSEMIDE 40 MG PO TABS: 40.0000 mg | ORAL_TABLET | Freq: Every day | ORAL | 11 refills | Status: DC

## 2015-10-15 NOTE — Progress Notes (Signed)
Cardiology Office Note    Date:  10/15/2015   ID:  [name], DOB 1920/03/27, MRN [id number]  PCP:  [name], MD  Cardiologist:   Sanda Klein, MD   Chief complaint: low BP   History of Present Illness:  [name] is a 82 y.o. 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 She was last hospitalized in June was small and STEMI, treated conservatively in view of advanced age and renal insufficiency. The ventricular systolic function was unchanged at 45-50%.   She continues to have atypical chest discomfort located primarily in her left lower rib cage but this has really not bothering her much at all. She remains essentially wheelchair-bound. She has lost substantial weight and her blood pressure is rather low. Low blood pressure has limited the use of antianginal and heart failure medications. She is not receiving beta blockers. She is on a nitroglycerin patch and [name]. She does have a history of GI bleeding, none recently. She does not have edema, orthopnea or PND. She has a lot of difficulty swallowing potassium chloride sustained release tablets. Her nursing assistant has noticed that she often illuminates these tablets unabsorbed in her stool.  Pacemaker interrogation shows normal device function. The lead parameters are excellent. Generates a longevity is 10.5 years. She has had only one episode of mode switch that suggests atrial fibrillation  lasting for only 51 seconds. She has only 4% atrial pacing and 96.6% ventricular pacing. No episodes of high ventricular rate are seen.   Past Medical History:  Diagnosis Date  . 2Nd degree atrioventricular block    a. 06/22/15 Gen change: MDT ADDR01 Adapta, DC PPM (ser# [id number]).  . Allergy to perfume   . Anemia   . Arthritis   . Chronic combined systolic and diastolic CHF (congestive heart failure) ([place])    a. 01/2014 Echo: EF @ least mod-sev reduced with HK of lat/apical, basal inf walls. basalpost AK, Gr 1DD; b. 06/2015 Echo: EF 45-50%, Gr2 DD, mod LVH.   . CKD (chronic kidney disease), stage III    a. iii - iv.  . Coronary artery disease    a. 1994 s/p cabg;  b. 09/2013 MV: large, sev intensity, partially reversible inf, apical defect, prior inf/ap infarct w/ mild peri-infarct ischemia->Med Rx; c. 06/2015 NSTEMI (trop 6)->Med rx.  . Daily headache   . DVT of upper extremity (deep vein thrombosis) ([place]) 06/13/2012   BUE  . Dyspepsia   . Gastric ulcer   . GERD (gastroesophageal reflux disease)   . H/O: GI bleed 12//13  . Hiatal hernia   . High cholesterol   . History of blood transfusion 2013  . Hypertensive heart disease   . Hypothyroid   . Legally blind   . PAF (paroxysmal atrial fibrillation) (HCC)    a. CHA2DS2VASc = 7-->coumadin.  . Presence of permanent cardiac pacemaker    a. 06/22/15 Gen change: MDT ADDR01 Adapta, DC PPM (ser# [id number]).  . Seasonal allergies   . Type II diabetes mellitus ([place])   . UTI (lower urinary tract infection)  Past Surgical History:  Procedure Laterality Date  . CARDIAC CATHETERIZATION  10/25/92  . CARDIAC CATHETERIZATION  11/18/03   w/grafts 100%CX LAD 80 & 100%  . CARDIAC CATHETERIZATION  01/24/05   diffuse disease of native vessels  . CARDIAC CATHETERIZATION  06/06/06   severe native CAD  . CARDIOVERSION  06/06/06   successful  . CATARACT EXTRACTION W/ INTRAOCULAR LENS  IMPLANT, BILATERAL Bilateral   . CORONARY ARTERY BYPASS GRAFT   10/27/92   LIMA to LAD,SVG to LAD second diagonal,obtuse maraginal of the CX and posterior descendingbranch of the RCA  . EP IMPLANTABLE DEVICE N/A 06/22/2015   Procedure: PPM/BIV PPM Generator Changeout;  Surgeon: Sanda Klein, MD;  Location: [facility];  Service: Cardiovascular;  Laterality: N/A;  . ESOPHAGOGASTRODUODENOSCOPY  12/22/2011   Procedure: ESOPHAGOGASTRODUODENOSCOPY (EGD);  Surgeon: Gatha Mayer, MD;  Location: [facility];  Service: Endoscopy;  Laterality: N/A;  . INSERT / REPLACE / REMOVE PACEMAKER  06/29/2006   Medtronic adapta  . REFRACTIVE SURGERY Bilateral     Current Medications: Outpatient Medications Prior to Visit  Medication Sig Dispense Refill  . acetaminophen (TYLENOL) 500 MG tablet Take 500 mg by mouth every 6 (six) hours as needed (pain).    Marland Kitchen albuterol (PROAIR HFA) 108 (90 Base) MCG/ACT inhaler Inhale 2 puffs into the lungs every 6 (six) hours as needed for wheezing or shortness of breath.    Marland Kitchen atropine 1 % ophthalmic solution Place 1 drop into both eyes daily as needed (burning eyes).   0  . Blood Glucose Monitoring Suppl (ONE TOUCH ULTRA 2) w/Device KIT Use to check blood sugar 2 times per day dx code E11.65 1 each 0  . cholecalciferol (VITAMIN D) 1000 units tablet Take 1,000 Units by mouth daily. Reported on 07/30/2015    . ciprofloxacin (CIPRO) 250 MG tablet Take 250 mg by mouth 2 (two) times daily.  0  . diclofenac sodium (VOLTAREN) 1 % GEL Apply 4 g topically 3 (three) times daily. To right knee 1 Tube 3  . donepezil (ARICEPT) 5 MG tablet Take 5 mg by mouth at bedtime.   0  . folic acid (FOLVITE) 1 MG tablet take 1 tablet by mouth once daily 30 tablet 9  . gabapentin (NEURONTIN) 100 MG capsule Take 100 mg by mouth 2 (two) times daily.     Marland Kitchen glipiZIDE (GLUCOTROL) 5 MG tablet TAKE ONE-HALF TABLET BY  MOUTH DAILY ONLY IF BLOOD  SUGAR IS OVER 200 (Patient taking differently: TAKE ONE-HALF TABLET BY  MOUTH DAILY ONLY IF BLOOD  SUGAR IS 300 OR MORE) 45  tablet 1  . guaifenesin (MUCUS RELIEF CHEST CONGESTION) 400 MG TABS tablet Take 400 mg by mouth 2 (two) times daily as needed (cough/ congestion).    Marland Kitchen JANUVIA 50 MG tablet take 1 tablet by mouth once daily 30 tablet 1  . levothyroxine (SYNTHROID, LEVOTHROID) 125 MCG tablet Half tablet daily 45 tablet 1  . loratadine (CLARITIN) 10 MG tablet Take 10 mg by mouth daily.    Marland Kitchen lubiprostone (AMITIZA) 8 MCG capsule Take 1 capsule (8 mcg total) by mouth 2 (two) times daily with a meal. 60 capsule 3  . meclizine (ANTIVERT) 25 MG tablet Take 25 mg by mouth 3 (three) times daily as needed for dizziness.    Marland Kitchen omeprazole (PRILOSEC) 20 MG capsule Take 20 mg by mouth daily before breakfast.     . ONE TOUCH ULTRA TEST test strip TEST TWICE DAILY 100 each 2  .  OVER THE COUNTER MEDICATION Take 1 Can by mouth daily. Enterex nutritional supplement - with choline    . OVER THE COUNTER MEDICATION Take 1 tablet by mouth 2 (two) times daily with a meal. FD Guard    . OVER THE COUNTER MEDICATION Take 1 tablet by mouth daily. Micro-algae DHA    . OVER THE COUNTER MEDICATION Take 1 capsule by mouth daily. Choline 100 mg, trimethylglycine 50 mg, glutathione 50 mg, methyltetrahydrofolate, l-arginine-, l-ornithine, l-citruline    . Polyvinyl Alcohol-Povidone (REFRESH OP) Place 1 drop into both eyes daily as needed (dry eyes).     . ranolazine ([name]) 500 MG 12 hr tablet Take 1 tablet (500 mg total) by mouth 2 (two) times daily. 180 tablet 3  . rosuvastatin (CRESTOR) 10 MG tablet Take 1 tablet (10 mg total) by mouth 3 (three) times a week. 30 tablet 3  . senna-docusate (SENOKOT S) 8.6-50 MG tablet Take 1 tablet by mouth 2 (two) times daily. For constipation 60 tablet 1  . traMADol (ULTRAM) 50 MG tablet Take 1 tablet (50 mg total) by mouth every 6 (six) hours as needed for moderate pain. (Patient taking differently: Take 50 mg by mouth 2 (two) times daily as needed for moderate pain. ) 30 tablet 0  . warfarin (COUMADIN) 3 MG  tablet Take 1 to 1.5 tablets by mouth daily or as directed by coumadin clinic 40 tablet 3  . zolpidem (AMBIEN) 5 MG tablet Take 0.5 tablets (2.5 mg total) by mouth at bedtime. sleep (Patient taking differently: Take 5 mg by mouth. sleep) 30 tablet 0  . aspirin EC 81 MG tablet Take 81 mg by mouth daily.    . furosemide (LASIX) 40 MG tablet Take 1 tablet (40 mg total) by mouth 2 (two) times daily. 60 tablet 3  . nitroGLYCERIN (NITRODUR - DOSED IN MG/24 HR) 0.2 mg/hr patch apply 1 patch once daily 30 patch 6  . potassium chloride (K-DUR) 10 MEQ tablet take 1 tablet by mouth twice a day 60 tablet 6   No facility-administered medications prior to visit.      Allergies:   Darvon; Digoxin and related; Penicillins; Percocet [oxycodone-acetaminophen]; Percodan [oxycodone-aspirin]; and Vicodin [hydrocodone-acetaminophen]   Social History   Social History  . Marital status: Widowed    Spouse name: N/A  . Number of children: 10  . Years of education: N/A   Occupational History  .  Retired   Social History Main Topics  . Smoking status: Never Smoker  . Smokeless tobacco: Never Used  . Alcohol use No  . Drug use: No  . Sexual activity: No   Other Topics Concern  . None   Social History Narrative  . None     Family History:  The patient's family history includes Breast cancer in her daughter; Colon polyps in her daughter; Diabetes in her brother, daughter, father, and son; Heart attack in her brother; Heart disease in her father; Stroke in her sister.   ROS:   Please see the history of present illness.    ROS All other systems reviewed and are negative.   PHYSICAL EXAM:   VS:  BP 95/65 (BP Location: Left Arm, Patient Position: Sitting, Cuff Size: Normal)   Pulse 76   Ht '5\' 4"'$  (1.626 m)   Wt 74.2 kg (163 lb 8 oz)   SpO2 96%   BMI 28.06 kg/m    GEN: overweight, well developed, in no acute distress  HEENT: normal . She is legally blind. Neck:  no JVD, carotid bruits, or  masses Cardiac: Healthy pacemaker site, paradoxically split second heart sound, RRR; no murmurs, rubs, or gallops,no edema  Respiratory:  clear to auscultation bilaterally, normal work of breathing GI: soft, nontender, nondistended, + BS MS: no deformity or atrophy  Skin: warm and dry, no rash Neuro:  Alert and Oriented x 3, Strength and sensation are intact Psych: euthymic mood, full affect  Wt Readings from Last 3 Encounters:  10/15/15 74.2 kg (163 lb 8 oz)  09/09/15 73.9 kg (163 lb)  08/13/15 79.4 kg (175 lb)      Studies/Labs Reviewed:   EKG:  EKG is not ordered today.  The intracardiac electrogram today demonstrates A sensed V paced rhythm  Recent Labs: 06/24/2015: Magnesium 2.0 06/26/2015: B Natriuretic Peptide 252.8 07/01/2015: Hemoglobin 9.7; Platelets 140 07/07/2015: ALT 9; TSH 7.99 10/14/2015: BUN 25; Creatinine, Ser 1.27; Potassium 4.3; Sodium 144   Lipid Panel    Component Value Date/Time   CHOL 170 10/24/2013 1006   TRIG 201.0 (H) 10/24/2013 1006   HDL 30.50 (L) 10/24/2013 1006   CHOLHDL 6 10/24/2013 1006   VLDL 40.2 (H) 10/24/2013 1006   LDLCALC 61 06/24/2013 1127   LDLDIRECT 92.3 10/24/2013 1006     ASSESSMENT:    1. Chronic combined systolic and diastolic CHF (congestive heart failure) (HCC)   2. Complete heart block (HCC)   3. Pacemaker   4. Coronary artery disease involving coronary bypass graft of native heart with other forms of angina pectoris (HCC)   5. Dyslipidemia   6. PAF (paroxysmal atrial fibrillation) (HCC)   7. Medication management      PLAN:  In order of problems listed above:  1. CHF: Clinically she appears to be euvolemic. She has lost substantial weight since her last hospitalization. We'll decrease the furosemide to once daily and decrease the potassium supplement to a smaller, easier to swallow It. She has a special home scale which allows weighing on a wheelchair, and this is invaluable in management of her heart failure. She is  asked to call her she gains more than 5 pounds over her current weight. Low blood pressure precludes the use of ACE inhibitor/angiotensin receptor blockers or beta blockers. 2. CHB: Pacemaker dependent. Normal device function. 3. PPM: Remote download in 3 months and office visit in 6 months 4. CAD s/p CABG: She does have angina pectoris, known reversible ischemia on nuclear stress test and had a recent non-STEMI but because of her advanced age and comorbid conditions we are pursuing medical management only. Angina is well controlled on current dose of [name]. Will try to decrease the nitroglycerin patch dose to see if this allows her blood pressure to increase a little bit. 5. HLP: On statin with fair lipid profile 6. AFib: Recently with very infrequent and brief episodes of arrhythmia, but with high embolic risk CHADSVasc 29 (age 48, CVA 2, HTN, gender, CHF, CAD, DM). However has also had significant GI bleeding requiring transfusion. Will stop the aspirin since 3 months have passed from her non-STEMI. Continue warfarin.    Medication Adjustments/Labs and Tests Ordered: Current medicines are reviewed at length with the patient today.  Concerns regarding medicines are outlined above.  Medication changes, Labs and Tests ordered today are listed in the Patient Instructions below. Patient Instructions  Medication Instructions: Dr Royann Shivers has recommended making the following medication changes: 1. STOP Aspirin 2. DECREASE Furosemide to 40 mg ONCE daily 3. DECREASE Potassium to 8 mEq ONCE daily 4. DECREASE Nitroglycerin patch  to 0.1 mg ONCE daily  Labwork: Your physician recommends that you return for lab work in 10 days - FASTING.  Testing/Procedures: NONE ORDERED  Follow-up: I will contact you to arrange follow-up.  If you need a refill on your cardiac medications before your next appointment, please call your pharmacy.   Weigh daily. Call [phone] if weight climbs more than 5 pounds  greater than TODAY'S WEIGHT! No salt to very little salt in your diet.  No more than 2000 mg in a day. Call if increased shortness of breath or increased swelling.   Dr Sallyanne Kuster has referred you to home health care for heart failure management.    Signed, Sanda Klein, MD  10/15/2015 1:30 PM    [facility] [address], [place], [place]  [address] Phone: [phone]; Fax: [phone]

## 2015-10-15 NOTE — Patient Instructions (Addendum)
Medication Instructions: Dr Sallyanne Kuster has recommended making the following medication changes: 1. STOP Aspirin 2. DECREASE Furosemide to 40 mg ONCE daily 3. DECREASE Potassium to 8 mEq ONCE daily 4. DECREASE Nitroglycerin patch to 0.1 mg ONCE daily  Labwork: Your physician recommends that you return for lab work in 10 days - FASTING.  Testing/Procedures: 1. Remote Device Transmission - Remote monitoring is used to monitor your Pacemaker of ICD from home. This monitoring reduces the number of office visits required to check your device to one time per year. It allows [place] to keep an eye on the functioning of your device to ensure it is working properly. You are scheduled for a device check from home on Friday, December 29th, 2017. You may send your transmission at any time that day. If you have a wireless device, the transmission will be sent automatically. After your physician reviews your transmission, you will receive a postcard with your next transmission date.  Follow-up: Dr Sallyanne Kuster recommends that you schedule a follow-up appointment in 6 months with a device check. You will receive a reminder letter in the mail two months in advance. If you don't receive a letter, please call our office to schedule the follow-up appointment.  If you need a refill on your cardiac medications before your next appointment, please call your pharmacy.   Weigh daily. Call [phone] if weight climbs more than 5 pounds greater than TODAY'S WEIGHT! No salt to very little salt in your diet.  No more than 2000 mg in a day. Call if increased shortness of breath or increased swelling.   Dr Sallyanne Kuster has referred you to home health care for heart failure management.

## 2015-10-20 LAB — CUP PACEART INCLINIC DEVICE CHECK
Battery Impedance: 100 Ohm
Battery Remaining Longevity: 126 mo
Battery Voltage: 2.8 V
Brady Statistic AS VS Percent: 0.4 %
Implantable Lead Implant Date: 20080613
Implantable Lead Location: 753860
Lead Channel Impedance Value: 408 Ohm
Lead Channel Impedance Value: 646 Ohm
Lead Channel Setting Pacing Amplitude: 1.5 V
Lead Channel Setting Pacing Pulse Width: 0.4 ms
MDC IDC LEAD IMPLANT DT: 20080613
MDC IDC LEAD LOCATION: 753859
MDC IDC SESS DTM: 20171004143512
MDC IDC SET LEADCHNL RV PACING AMPLITUDE: 2.5 V
MDC IDC SET LEADCHNL RV SENSING SENSITIVITY: 5.6 mV
MDC IDC STAT BRADY AP VP PERCENT: 3.6 %
MDC IDC STAT BRADY AP VS PERCENT: 0.1 % — AB
MDC IDC STAT BRADY AS VP PERCENT: 95.9 %

## 2015-10-21 ENCOUNTER — Ambulatory Visit (INDEPENDENT_AMBULATORY_CARE_PROVIDER_SITE_OTHER): Payer: PPO | Admitting: Pharmacist

## 2015-10-21 DIAGNOSIS — Z7901 Long term (current) use of anticoagulants: Secondary | ICD-10-CM

## 2015-10-21 LAB — POCT INR: INR: 1.2

## 2015-10-22 ENCOUNTER — Telehealth: Payer: Self-pay | Admitting: Cardiovascular Disease

## 2015-10-22 NOTE — Telephone Encounter (Signed)
Pt gave verbal okay to speak with her caretaker. Informed caretaker we do have sample of [name] 500mg , sample will be held at the front desk for pick up. Caretaker informed me she understood and will relay message to Patients daughter.

## 2015-10-22 NOTE — Telephone Encounter (Signed)
New message       *STAT* If patient is at the pharmacy, call can be transferred to refill team.   1. Which medications need to be refilled? (please list name of each medication and dose if known) potassium 8mg  capsules not tablets 2. Which pharmacy/location (including street and city if local pharmacy) is medication [email or web address]  3. Do they need a 30 day or 90 day supply? [place]

## 2015-10-22 NOTE — Telephone Encounter (Signed)
New message      Patient calling the office for samples of medication:   1.  What medication and dosage are you requesting samples for? ranexa 500mg   2.  Are you currently out of this medication?  Almost out

## 2015-10-26 ENCOUNTER — Telehealth: Payer: Self-pay | Admitting: Cardiovascular Disease

## 2015-10-26 LAB — LIPID PANEL
CHOL/HDL RATIO: 6.5 ratio — AB (ref <=5.0)
CHOLESTEROL: 228 mg/dL — AB (ref 125–200)
HDL: 35 mg/dL — AB (ref >=46)
LDL CALC: 141 mg/dL — AB (ref <130)
TRIGLYCERIDES: 259 mg/dL — AB (ref <150)
VLDL: 52 mg/dL — AB (ref <30)

## 2015-10-26 LAB — COMPREHENSIVE METABOLIC PANEL
ALBUMIN: 3 g/dL — AB (ref 3.6–5.1)
ALT: 6 U/L (ref 6–29)
AST: 15 U/L (ref 10–35)
Alkaline Phosphatase: 86 U/L (ref 33–130)
BUN: 17 mg/dL (ref 7–25)
CALCIUM: 8.9 mg/dL (ref 8.6–10.4)
CHLORIDE: 105 mmol/L (ref 98–110)
CO2: 27 mmol/L (ref 20–31)
CREATININE: 1.16 mg/dL — AB (ref 0.60–0.88)
Glucose, Bld: 113 mg/dL — ABNORMAL HIGH (ref 65–99)
POTASSIUM: 4.7 mmol/L (ref 3.5–5.3)
Sodium: 140 mmol/L (ref 135–146)
TOTAL PROTEIN: 5.9 g/dL — AB (ref 6.1–8.1)
Total Bilirubin: 0.6 mg/dL (ref 0.2–1.2)

## 2015-10-26 NOTE — Telephone Encounter (Signed)
New message      *STAT* If patient is at the pharmacy, call can be transferred to refill team.   1. Which medications need to be refilled? (please list name of each medication and dose if known) potassium chloride (KLOR-CON) 8 MEQ tablet  2. Which pharmacy/location (including street and city if local pharmacy) is medication to be sent to? [facility] [address]   3. Do they need a 30 day or 90 day supply? Need to be capsule form .  30 days

## 2015-10-26 NOTE — Telephone Encounter (Signed)
[facility] for daughter

## 2015-10-27 MED ORDER — POTASSIUM CHLORIDE ER 8 MEQ PO CPCR: 8.0000 meq | ORAL_CAPSULE | Freq: Every day | ORAL | 6 refills | Status: DC

## 2015-10-27 NOTE — Telephone Encounter (Signed)
Ok to change to capsule - I believe the brand is Micro-K - looks like Dr. Loletha Grayer wanted 24meq.

## 2015-10-27 NOTE — Telephone Encounter (Signed)
Refill sent.

## 2015-10-27 NOTE — Telephone Encounter (Signed)
Spoke with daughter she states that pt should be on potassium capsule because the pill comes out whole in pt's Bm. She states that this was discussed at pt's LOV

## 2015-10-28 LAB — POCT INR: INR: 1.5

## 2015-10-29 ENCOUNTER — Telehealth: Payer: Self-pay | Admitting: Cardiovascular Disease

## 2015-10-29 ENCOUNTER — Ambulatory Visit (INDEPENDENT_AMBULATORY_CARE_PROVIDER_SITE_OTHER): Payer: PPO | Admitting: Pharmacist

## 2015-10-29 DIAGNOSIS — Z7901 Long term (current) use of anticoagulants: Secondary | ICD-10-CM

## 2015-10-29 DIAGNOSIS — E7849 Other hyperlipidemia: Secondary | ICD-10-CM

## 2015-10-29 MED ORDER — ROSUVASTATIN CALCIUM 10 MG PO TABS: 10.0000 mg | ORAL_TABLET | Freq: Every day | ORAL | 5 refills | Status: DC

## 2015-10-29 NOTE — Telephone Encounter (Signed)
Recommendations communicated to caller, who verbalized understanding. Rx change made and submitted to pharmacy, orders for repeat lipids written & released. [name] aware to call again if new concerns or questions.

## 2015-10-29 NOTE — Telephone Encounter (Signed)
Tried to call daughter, mailbox is full

## 2015-10-29 NOTE — Telephone Encounter (Signed)
Per Dr C lab result note:Routine labs look okay, kidney function is better than before, cholesterol is very high. Looks like she may have stopped her rosuvastatin. Please confirm and find out why

## 2015-10-29 NOTE — Telephone Encounter (Signed)
New Message  Pts daughter voiced she was returning nurses call about pts lab results.  Please f/u

## 2015-10-29 NOTE — Telephone Encounter (Signed)
Communicated results to caller. She voiced that the patient is still taking crestor 10mg  3 times a week. Doesn't think she's had any problems with this medication as far as side effects, and had been taking it daily at one point in time.  She's aware I will check w provider to see if a dose/freq change for medication is warranted.

## 2015-10-29 NOTE — Telephone Encounter (Signed)
Pt's daughter returned office call please call back today.

## 2015-10-29 NOTE — Telephone Encounter (Signed)
Please take daily. Repeat lipids in 3 months. Thank you.

## 2015-11-04 ENCOUNTER — Ambulatory Visit (INDEPENDENT_AMBULATORY_CARE_PROVIDER_SITE_OTHER): Payer: PPO | Admitting: Pharmacist

## 2015-11-04 DIAGNOSIS — Z7901 Long term (current) use of anticoagulants: Secondary | ICD-10-CM

## 2015-11-04 LAB — POCT INR: INR: 1.9

## 2015-11-10 ENCOUNTER — Telehealth: Payer: Self-pay | Admitting: Cardiovascular Disease

## 2015-11-10 NOTE — Telephone Encounter (Signed)
New message    Patient daughter calling - checking on the status of heart failure clinic.

## 2015-11-10 NOTE — Telephone Encounter (Signed)
Returned call, goes to VM.  In Dr. Victorino December last note, he indicates recommendation to have home health follow patient for HF case management. Unsure if orders will need to be submitted in epic - routed to [place] to advise.  Informed [name] of this in my VM and advised to call back if needed.

## 2015-11-11 LAB — POCT INR: INR: 2.1

## 2015-11-11 NOTE — Telephone Encounter (Signed)
[name] returned call today. Notified her and apologized for delay in heart failure management. Unaware that patient already had home health nurses coming to home. Reached out to home health care company ~1.5 weeks ago - with no response. Patient has [facility] coming into the home.  Called [facility], they can add heart failure management to her order.  Faxed signed order to Maudie Mercury at [facility] at [phone].

## 2015-11-12 ENCOUNTER — Ambulatory Visit (INDEPENDENT_AMBULATORY_CARE_PROVIDER_SITE_OTHER): Payer: PPO | Admitting: Pharmacist Clinician (PhC)/ Clinical Pharmacy Specialist

## 2015-11-12 DIAGNOSIS — Z7901 Long term (current) use of anticoagulants: Secondary | ICD-10-CM

## 2015-11-19 ENCOUNTER — Telehealth: Payer: Self-pay | Admitting: Cardiovascular Disease

## 2015-11-19 NOTE — Telephone Encounter (Signed)
New message       Patient's family faxed a list of supplement vitamins.  Did you get the list?  Is it ok to take these vitamins?

## 2015-11-22 ENCOUNTER — Telehealth: Payer: Self-pay | Admitting: Cardiovascular Disease

## 2015-11-22 NOTE — Telephone Encounter (Signed)
Follow up    Pt returning call for rn

## 2015-11-22 NOTE — Telephone Encounter (Signed)
Chelley, did you receive anything last week?

## 2015-11-22 NOTE — Telephone Encounter (Signed)
Left msg w [name] advising OK to resend fax and that if she has further questions to call.

## 2015-11-22 NOTE — Telephone Encounter (Signed)
New message  [name] calling for pt  Needs someone to call the home and confirm that fax was received last week for ingredients for suppliment   Please call back and confirm

## 2015-11-22 NOTE — Telephone Encounter (Signed)
I have not received anything. Mr [name] is not listed on patient's DPR.  Left detailed message on daughter's voicemail - ok per DPR.

## 2015-11-24 NOTE — Telephone Encounter (Signed)
[name] (daughter) is calling to find out if you have found out anything about the CHF Program and also wanted to let [name] know that she will have those papers faxed over from her [place] . Please call   Thanks

## 2015-11-24 NOTE — Telephone Encounter (Signed)
Spoke to [place], informed her orders were sent to home health agency and she voiced understanding. She will follow up with them and call back if she needs further assistance.

## 2015-11-25 ENCOUNTER — Ambulatory Visit (INDEPENDENT_AMBULATORY_CARE_PROVIDER_SITE_OTHER): Payer: PPO | Admitting: Pharmacist

## 2015-11-25 ENCOUNTER — Telehealth: Payer: Self-pay | Admitting: Cardiovascular Disease

## 2015-11-25 DIAGNOSIS — Z7901 Long term (current) use of anticoagulants: Secondary | ICD-10-CM

## 2015-11-25 LAB — POCT INR: INR: 1.8

## 2015-11-25 NOTE — Telephone Encounter (Signed)
Reginald faxed over an ingredient list for a patients supplement, wants to make sure we got it and ok for her to take-pls call pt

## 2015-11-25 NOTE — Telephone Encounter (Signed)
Did not receive fax-will check with Chelly in the am

## 2015-11-26 NOTE — Telephone Encounter (Signed)
Still nothing.

## 2015-11-26 NOTE — Telephone Encounter (Signed)
Spoke with [name] pt's daughter and gave her new fax number

## 2015-11-29 NOTE — Telephone Encounter (Signed)
Understood 

## 2015-11-29 NOTE — Telephone Encounter (Signed)
Fax received, in Dr [employer] mailbox for review

## 2015-12-01 ENCOUNTER — Other Ambulatory Visit: Payer: Self-pay | Admitting: Endocrinology

## 2015-12-01 ENCOUNTER — Ambulatory Visit (INDEPENDENT_AMBULATORY_CARE_PROVIDER_SITE_OTHER): Payer: PPO | Admitting: Endocrinology

## 2015-12-01 ENCOUNTER — Encounter: Payer: Self-pay | Admitting: Endocrinology

## 2015-12-01 VITALS — BP 108/68 | HR 71 | Ht 64.0 in | Wt 171.5 lb

## 2015-12-01 DIAGNOSIS — E063 Autoimmune thyroiditis: Secondary | ICD-10-CM

## 2015-12-01 DIAGNOSIS — E1165 Type 2 diabetes mellitus with hyperglycemia: Secondary | ICD-10-CM | POA: Diagnosis not present

## 2015-12-01 DIAGNOSIS — E7800 Pure hypercholesterolemia, unspecified: Secondary | ICD-10-CM | POA: Diagnosis not present

## 2015-12-01 DIAGNOSIS — E038 Other specified hypothyroidism: Secondary | ICD-10-CM | POA: Diagnosis not present

## 2015-12-01 LAB — LIPID PANEL
CHOL/HDL RATIO: 7
CHOLESTEROL: 222 mg/dL — AB (ref 0–200)
HDL: 32.2 mg/dL — ABNORMAL LOW (ref >39.00)
NonHDL: 189.4
TRIGLYCERIDES: 244 mg/dL — AB (ref 0.0–149.0)
VLDL: 48.8 mg/dL — AB (ref 0.0–40.0)

## 2015-12-01 LAB — BASIC METABOLIC PANEL
BUN: 16 mg/dL (ref 6–23)
CALCIUM: 8.9 mg/dL (ref 8.4–10.5)
CHLORIDE: 105 meq/L (ref 96–112)
CO2: 31 meq/L (ref 19–32)
Creatinine, Ser: 1.14 mg/dL (ref 0.40–1.20)
GFR: 57.15 mL/min — ABNORMAL LOW (ref >60.00)
Glucose, Bld: 149 mg/dL — ABNORMAL HIGH (ref 70–99)
Potassium: 3.7 mEq/L (ref 3.5–5.1)
SODIUM: 141 meq/L (ref 135–145)

## 2015-12-01 LAB — LDL CHOLESTEROL, DIRECT: Direct LDL: 133 mg/dL

## 2015-12-01 LAB — TSH: TSH: 2.02 u[IU]/mL (ref 0.35–4.50)

## 2015-12-01 LAB — POCT GLYCOSYLATED HEMOGLOBIN (HGB A1C): Hemoglobin A1C: 6

## 2015-12-01 NOTE — Progress Notes (Signed)
Patient ID: [name], female   DOB: 01-24-1920, 82 y.o.   MRN: [id number]   Reason for Appointment: Diabetes follow-up   History of Present Illness   Diagnosis: Type 2 diabetes mellitus, date of diagnosis: 1972.   She had been on insulin for several years and this had been tapered off in 2013 because of weight loss and lower blood sugars. Subsequently blood sugars had been generally reasonably good.  Because of her age and multiple medical problems she has been monitored without insulin  Recent history:  She has not been seen in follow-up June Because of her age her medications have been reduced or stopped because of tendency to hypoglycemia However because of relatively high readings in June and she was advised to start Januvia 50 mg daily and has been able to do so  Current management, blood sugar patterns and problems:  She did  bring her monitor for download and is now using a One Touch meter  She has not been checking her blood sugars after meals but only before breakfast and suppertime  Although she has some variability in her morning readings are relatively good  Her blood sugars in the evenings are overall better than on her last visit   She is not able to give a good history and her daughter who is her normal caretaker is not present today to give full history Monitors blood glucose: 2 times daily.   Glucometer: One Touch  Recent readings   Mean values apply above for all meters except median for One Touch  PRE-MEAL Fasting Lunch Dinner Bedtime Overall  Glucose range: 108-195   112-245     Mean/median: 135   158   153     Meals: 2 meals at about noon and 7 pm; sometimes has lemonade or sprite. Has small portions, has some grapes for snacks in the afternoon  Dietician visit: Most recent:2001.     Wt Readings from Last 3 Encounters:  12/01/15 171 lb 8 oz (77.8 kg)  10/15/15 163 lb 8 oz (74.2 kg)  09/09/15 163 lb (73.9 kg)   Lab Results    Component Value Date   HGBA1C 7.3 (H) 06/24/2015   HGBA1C 7.6 01/14/2015   HGBA1C 7.1 09/30/2014   Lab Results  Component Value Date   LDLCALC 141 (H) 10/25/2015   CREATININE 1.16 (H) 10/25/2015       Medication List       Accurate as of 12/01/15  3:27 PM. Always use your most recent med list.          acetaminophen 500 MG tablet Commonly known as:  TYLENOL Take 500 mg by mouth every 6 (six) hours as needed (pain).   atropine 1 % ophthalmic solution Place 1 drop into both eyes daily as needed (burning eyes).   cholecalciferol 1000 units tablet Commonly known as:  VITAMIN D Take 1,000 Units by mouth daily. Reported on 07/30/2015   ciprofloxacin 250 MG tablet Commonly known as:  CIPRO Take 250 mg by mouth 2 (two) times daily.   diclofenac sodium 1 % Gel Commonly known as:  VOLTAREN Apply 4 g topically 3 (three) times daily. To right knee   donepezil 5 MG tablet Commonly known as:  ARICEPT Take 5 mg by mouth at bedtime.   folic acid 1 MG tablet Commonly known as:  FOLVITE take 1 tablet by mouth once daily   furosemide 40 MG tablet Commonly known as:  LASIX Take 1 tablet (40 mg  total) by mouth daily.   gabapentin 100 MG capsule Commonly known as:  NEURONTIN Take 100 mg by mouth 2 (two) times daily.   glipiZIDE 5 MG tablet Commonly known as:  GLUCOTROL TAKE ONE-HALF TABLET BY  MOUTH DAILY ONLY IF BLOOD  SUGAR IS OVER 200   JANUVIA 50 MG tablet Generic drug:  sitaGLIPtin take 1 tablet by mouth once daily   levothyroxine 125 MCG tablet Commonly known as:  SYNTHROID, LEVOTHROID Half tablet daily   loratadine 10 MG tablet Commonly known as:  CLARITIN Take 10 mg by mouth daily.   lubiprostone 8 MCG capsule Commonly known as:  AMITIZA Take 1 capsule (8 mcg total) by mouth 2 (two) times daily with a meal.   meclizine 25 MG tablet Commonly known as:  ANTIVERT Take 25 mg by mouth 3 (three) times daily as needed for dizziness.   MUCUS RELIEF CHEST  CONGESTION 400 MG Tabs tablet Generic drug:  guaifenesin Take 400 mg by mouth 2 (two) times daily as needed (cough/ congestion).   nitroGLYCERIN 0.1 mg/hr patch Commonly known as:  NITRODUR - Dosed in mg/24 hr Place 1 patch (0.1 mg total) onto the skin daily.   omeprazole 20 MG capsule Commonly known as:  PRILOSEC Take 20 mg by mouth daily before breakfast.   ONE TOUCH ULTRA 2 w/Device Kit Use to check blood sugar 2 times per day dx code E11.65   ONE TOUCH ULTRA TEST test strip Generic drug:  glucose blood TEST TWICE DAILY   OVER THE COUNTER MEDICATION Take 1 Can by mouth daily. Enterex nutritional supplement - with choline   OVER THE COUNTER MEDICATION Take 1 tablet by mouth 2 (two) times daily with a meal. FD Guard   OVER THE COUNTER MEDICATION Take 1 tablet by mouth daily. Micro-algae DHA   OVER THE COUNTER MEDICATION Take 1 capsule by mouth daily. Choline 100 mg, trimethylglycine 50 mg, glutathione 50 mg, methyltetrahydrofolate, l-arginine-, l-ornithine, l-citruline   Potassium Chloride CR 8 MEQ Cpcr capsule CR Commonly known as:  MICRO-K Take 1 capsule (8 mEq total) by mouth daily.   PROAIR HFA 108 (90 Base) MCG/ACT inhaler Generic drug:  albuterol Inhale 2 puffs into the lungs every 6 (six) hours as needed for wheezing or shortness of breath.   ranolazine 500 MG 12 hr tablet Commonly known as:  [name] Take 1 tablet (500 mg total) by mouth 2 (two) times daily.   REFRESH OP Place 1 drop into both eyes daily as needed (dry eyes).   rosuvastatin 10 MG tablet Commonly known as:  CRESTOR Take 1 tablet (10 mg total) by mouth daily.   senna-docusate 8.6-50 MG tablet Commonly known as:  SENOKOT S Take 1 tablet by mouth 2 (two) times daily. For constipation   traMADol 50 MG tablet Commonly known as:  ULTRAM Take 1 tablet (50 mg total) by mouth every 6 (six) hours as needed for moderate pain.   warfarin 3 MG tablet Commonly known as:  COUMADIN Take 1 to 1.5  tablets by mouth daily or as directed by coumadin clinic   zolpidem 5 MG tablet Commonly known as:  AMBIEN Take 0.5 tablets (2.5 mg total) by mouth at bedtime. sleep       Past Medical History:  Diagnosis Date  . 2nd degree atrioventricular block    a. 06/22/15 Gen change: MDT ADDR01 Adapta, DC PPM (ser# [id number]).  . Allergy to perfume   . Anemia   . Arthritis   . Chronic combined systolic  and diastolic CHF (congestive heart failure) ([place])    a. 01/2014 Echo: EF @ least mod-sev reduced with HK of lat/apical, basal inf walls. basalpost AK, Gr 1DD; b. 06/2015 Echo: EF 45-50%, Gr2 DD, mod LVH.   . CKD (chronic kidney disease), stage III    a. iii - iv.  . Coronary artery disease    a. 1994 s/p cabg;  b. 09/2013 MV: large, sev intensity, partially reversible inf, apical defect, prior inf/ap infarct w/ mild peri-infarct ischemia->Med Rx; c. 06/2015 NSTEMI (trop 6)->Med rx.  . Daily headache   . DVT of upper extremity (deep vein thrombosis) ([place]) 06/13/2012   BUE  . Dyspepsia   . Gastric ulcer   . GERD (gastroesophageal reflux disease)   . H/O: GI bleed 12//13  . Hiatal hernia   . High cholesterol   . History of blood transfusion 2013  . Hypertensive heart disease   . Hypothyroid   . Legally blind   . PAF (paroxysmal atrial fibrillation) ([facility])    a. CHA2DS2VASc = 7-->coumadin.  . Presence of permanent cardiac pacemaker    a. 06/22/15 Gen change: MDT ADDR01 Adapta, DC PPM (ser# [id number]).  . Seasonal allergies   . Type II diabetes mellitus ([place])   . UTI (lower urinary tract infection)     Past Surgical History:  Procedure Laterality Date  . CARDIAC CATHETERIZATION  10/25/92  . CARDIAC CATHETERIZATION  11/18/03   w/grafts 100%CX LAD 80 & 100%  . CARDIAC CATHETERIZATION  01/24/05   diffuse disease of native vessels  . CARDIAC CATHETERIZATION  06/06/06   severe native CAD  . CARDIOVERSION  06/06/06   successful  . CATARACT EXTRACTION W/ INTRAOCULAR LENS  IMPLANT, BILATERAL  Bilateral   . CORONARY ARTERY BYPASS GRAFT  10/27/92   LIMA to LAD,SVG to LAD second diagonal,obtuse maraginal of the CX and posterior descendingbranch of the RCA  . EP IMPLANTABLE DEVICE N/A 06/22/2015   Procedure: PPM/BIV PPM Generator Changeout;  Surgeon: Sanda Klein, MD;  Location: [facility];  Service: Cardiovascular;  Laterality: N/A;  . ESOPHAGOGASTRODUODENOSCOPY  12/22/2011   Procedure: ESOPHAGOGASTRODUODENOSCOPY (EGD);  Surgeon: Gatha Mayer, MD;  Location: [facility];  Service: Endoscopy;  Laterality: N/A;  . INSERT / REPLACE / REMOVE PACEMAKER  06/29/2006   Medtronic adapta  . REFRACTIVE SURGERY Bilateral     Family History  Problem Relation Age of Onset  . Heart disease Father   . Diabetes Father   . Breast cancer Daughter   . Diabetes Son   . Diabetes Daughter   . Colon polyps Daughter   . Heart attack Brother   . Diabetes Brother   . Stroke Sister   . Colon cancer Neg Hx     Social History:  reports that she has never smoked. She has never used smokeless tobacco. She reports that she does not drink alcohol or use drugs.  Allergies:  Allergies  Allergen Reactions  . Darvon Other (See Comments)    confusion  . Digoxin And Related Nausea Only  . Penicillins Other (See Comments)    Was told had allergy from childhood... Unknown reaction  . Percocet [Oxycodone-Acetaminophen] Other (See Comments)    confusion  . Percodan [Oxycodone-Aspirin] Other (See Comments)    confusion  . Vicodin [Hydrocodone-Acetaminophen] Other (See Comments)    confusion    REVIEW of systems:  Hypothyroidism: She is taking levothyroxine  62.5 mcg daily  Her dose was increased on her last visit when TSH was about 8  Lab Results  Component Value Date   TSH 7.99 (H) 07/07/2015   TSH 7.96 (H) 06/08/2015   TSH 3.116 08/19/2014   FREET4 0.70 06/25/2014   FREET4 0.65 06/24/2013   FREET4 0.93 01/24/2013     She has had renal dysfunction which is Relatively better  now   Lab Results  Component Value Date   CREATININE 1.16 (H) 10/25/2015    Has had Chronic pedal edema.  She has significant neuropathy with sensory loss on previous  foot exam.     Examination:   BP 108/68   Pulse 71   Ht '5\' 4"'$  (1.626 m)   Wt 171 lb 8 oz (77.8 kg)   SpO2 95%   BMI 29.44 kg/m   Body mass index is 29.44 kg/m.   She has mild lower leg edema, more on the left  Assesment/plan:   1.  Diabetes type 2, with obesity, long-standing   Now With taking Januvia 50 mg daily her blood sugars are better than before A1c is also improved at 6% She has less tendency to postprandial hyperglycemia which had been a problem previously Given instructions for glucose monitoring: She can cut back on monitoring and check only once a day at rotating times Also she should not take any glipizide unless blood sugars are over 250 Advised her that she can have liberal diet and many desserts if she wants to  2.  Hypothyroidism: Will check labs today   [facility] 12/01/2015, 3:27 PM        .

## 2015-12-01 NOTE — Patient Instructions (Signed)
Check sugar 1x daily alternate am and 2-3 hrs after supper  No Glipizide unless sugar > 250

## 2015-12-02 ENCOUNTER — Ambulatory Visit (INDEPENDENT_AMBULATORY_CARE_PROVIDER_SITE_OTHER): Payer: PPO | Admitting: Pharmacist Clinician (PhC)/ Clinical Pharmacy Specialist

## 2015-12-02 ENCOUNTER — Telehealth: Payer: Self-pay | Admitting: Endocrinology

## 2015-12-02 DIAGNOSIS — Z7901 Long term (current) use of anticoagulants: Secondary | ICD-10-CM

## 2015-12-02 LAB — POCT INR: INR: 2.2

## 2015-12-02 NOTE — Telephone Encounter (Signed)
Patient need a refill of medication levothyroxine (SYNTHROID, LEVOTHROID) 125 MCG tablet  [facility] [address] [place], [place] [facility] [address] [phone] (Phone) [phone] (Fax)

## 2015-12-02 NOTE — Telephone Encounter (Signed)
Pt needs her Synthroid sent into the [facility] on [address]. She is completely out.

## 2015-12-03 ENCOUNTER — Other Ambulatory Visit: Payer: Self-pay

## 2015-12-03 MED ORDER — LEVOTHYROXINE SODIUM 125 MCG PO TABS: 62.5000 ug | ORAL_TABLET | Freq: Every day | ORAL | 1 refills | Status: DC

## 2015-12-03 NOTE — Telephone Encounter (Signed)
Ordered 12/03/15

## 2015-12-08 NOTE — Telephone Encounter (Signed)
[facility] reviewed ingredient list, supplements okay to take.  Notified patient's daughter, [name].

## 2015-12-10 ENCOUNTER — Other Ambulatory Visit: Payer: Self-pay | Admitting: Cardiovascular Disease

## 2015-12-11 ENCOUNTER — Other Ambulatory Visit: Payer: Self-pay | Admitting: Endocrinology

## 2015-12-13 ENCOUNTER — Other Ambulatory Visit: Payer: Self-pay

## 2015-12-13 ENCOUNTER — Telehealth: Payer: Self-pay

## 2015-12-13 DIAGNOSIS — C50112 Malignant neoplasm of central portion of left female breast: Secondary | ICD-10-CM

## 2015-12-13 DIAGNOSIS — N644 Mastodynia: Secondary | ICD-10-CM

## 2015-12-13 NOTE — Telephone Encounter (Signed)
Received call through triage, pt daughter calling regarding the pt having new onset of bilateral breast pain for a few months on/off now and has been getting worse. No fever, chills, edema, redness or lump noted. Pt due for her annual mammogram. Pt daughter states that she has also been off of her anastrozole for some time now, and would like to see Dr.Gudena to discuss new symptoms of breast pain. Told pt daughter that will need to set her up with bilateral mammogram and ultrasound to get evaluated will set up follow up visit with Dr. Lindi Adie in the next 2 weeks. Pt daughter voiced understanding and wants to have it done at the breast center [facility]. Pt last mammograms and test done at [facility]. Will need for pt daughter to sign consent to release information of records to breast center prior to test. Provided daughter with contact information.     54- Was able to set up pt testing on Thursday nov 30 at 2:30pm. Called pt daughter back to confirm time/date and to arrive early for paperwork. Sent msg to scheduling dept to make appt in the next 2 weeks. No further concerns or questions at this time. Pt daughter verbalized understanding.

## 2015-12-16 ENCOUNTER — Ambulatory Visit
Admission: RE | Admit: 2015-12-16 | Discharge: 2015-12-16 | Disposition: A | Discharge status: Home / Self Care | Payer: PPO | Source: Ambulatory Visit | Attending: Hematology and Oncology | Admitting: Hematology and Oncology

## 2015-12-16 ENCOUNTER — Other Ambulatory Visit: Payer: Self-pay | Admitting: Hematology and Oncology

## 2015-12-16 ENCOUNTER — Other Ambulatory Visit: Payer: Self-pay

## 2015-12-16 DIAGNOSIS — C50112 Malignant neoplasm of central portion of left female breast: Secondary | ICD-10-CM

## 2015-12-16 DIAGNOSIS — N644 Mastodynia: Secondary | ICD-10-CM

## 2015-12-17 ENCOUNTER — Ambulatory Visit (INDEPENDENT_AMBULATORY_CARE_PROVIDER_SITE_OTHER): Payer: PPO | Admitting: Pharmacist

## 2015-12-17 DIAGNOSIS — Z7901 Long term (current) use of anticoagulants: Secondary | ICD-10-CM

## 2015-12-17 LAB — POCT INR: INR: 2.1

## 2015-12-21 ENCOUNTER — Telehealth: Payer: Self-pay | Admitting: Endocrinology

## 2015-12-21 NOTE — Telephone Encounter (Signed)
Pt daughter called and said that the Januvia is too expensive and needs to have something alternative called in.

## 2015-12-22 NOTE — Telephone Encounter (Signed)
Pt's daughter is calling again regarding the price of the Januvia.

## 2015-12-22 NOTE — Telephone Encounter (Signed)
Spoke to the patients daughter and she said that her mother can not afford the medication it cost $32 with insurance so she wants to know if there is anything else she can take instead

## 2015-12-22 NOTE — Telephone Encounter (Signed)
Recommend that she take 100 mg tablets and use half daily, will change prescription if she agrees

## 2015-12-23 ENCOUNTER — Other Ambulatory Visit: Payer: Self-pay

## 2015-12-23 MED ORDER — GLIPIZIDE 5 MG PO TABS: ORAL_TABLET | ORAL | 1 refills | Status: DC

## 2015-12-23 NOTE — Telephone Encounter (Signed)
She can switch to glipizide ER 2.5 mg daily, she needs to let us know if she starts having low sugars

## 2015-12-23 NOTE — Telephone Encounter (Signed)
Ordered refill- spoke with daughter and gave her instructions she stated an understanding

## 2015-12-24 ENCOUNTER — Other Ambulatory Visit: Payer: PPO

## 2015-12-31 ENCOUNTER — Ambulatory Visit (INDEPENDENT_AMBULATORY_CARE_PROVIDER_SITE_OTHER): Payer: PPO | Admitting: Pharmacist

## 2015-12-31 DIAGNOSIS — Z7901 Long term (current) use of anticoagulants: Secondary | ICD-10-CM

## 2015-12-31 LAB — POCT INR: INR: 1.9

## 2016-01-03 ENCOUNTER — Ambulatory Visit
Admission: RE | Admit: 2016-01-03 | Discharge: 2016-01-03 | Disposition: A | Discharge status: Home / Self Care | Payer: PPO | Source: Ambulatory Visit | Attending: Hematology and Oncology | Admitting: Hematology and Oncology

## 2016-01-03 ENCOUNTER — Ambulatory Visit: Admission: RE | Admit: 2016-01-03 | Payer: PPO | Source: Ambulatory Visit

## 2016-01-03 DIAGNOSIS — N644 Mastodynia: Secondary | ICD-10-CM

## 2016-01-03 DIAGNOSIS — C50112 Malignant neoplasm of central portion of left female breast: Secondary | ICD-10-CM

## 2016-01-07 ENCOUNTER — Telehealth: Payer: Self-pay

## 2016-01-07 NOTE — Telephone Encounter (Signed)
Received vm from daughter about scheduling f/u appt to discuss pt mm results. Called lvm to call back and we can schedule the 1st week of January. Call back number provided to make schedule.

## 2016-01-13 ENCOUNTER — Telehealth: Payer: Self-pay | Admitting: Cardiovascular Disease

## 2016-01-13 NOTE — Telephone Encounter (Signed)
Returned call to [name] and have made her aware no [name] in stock. She states pt will be unable to afford medication. I let her know I'm not aware of any alternatives for [name] but that I would see if Dr. Sallyanne Kuster can review and make any recommendations on her other meds. Advised for now to continue current med regimen minus [name]. Daughter voiced understanding. Will follow up w further advice.

## 2016-01-13 NOTE — Telephone Encounter (Signed)
Unfortunately, no analogous medications are available. Patient assistance program is the best bet.

## 2016-01-13 NOTE — Telephone Encounter (Signed)
Have informed caller [name] samples unavailable - none stocked at [employer]. She stated Sunday Spillers was going to check w [employer] office to see if any on-hand. Informed her I would route msg to [facility] for review.  Please provide if able - thanks!

## 2016-01-13 NOTE — Telephone Encounter (Signed)
Patient calling the office for samples of medication: ° ° °1.  What medication and dosage are you requesting samples for? [name]  ° °2.  Are you currently out of this medication? Yes  ° ° °

## 2016-01-13 NOTE — Telephone Encounter (Signed)
We could pursue patient assistance through drug company if interested.

## 2016-01-14 ENCOUNTER — Encounter: Payer: PPO | Admitting: *Deleted

## 2016-01-14 ENCOUNTER — Telehealth: Payer: Self-pay | Admitting: Medical Oncology

## 2016-01-14 ENCOUNTER — Telehealth: Payer: Self-pay | Admitting: Cardiology

## 2016-01-14 ENCOUNTER — Telehealth: Payer: Self-pay | Admitting: Hematology and Oncology

## 2016-01-14 ENCOUNTER — Ambulatory Visit (INDEPENDENT_AMBULATORY_CARE_PROVIDER_SITE_OTHER): Payer: PPO | Admitting: Pharmacist

## 2016-01-14 DIAGNOSIS — Z7901 Long term (current) use of anticoagulants: Secondary | ICD-10-CM

## 2016-01-14 LAB — POCT INR: INR: 2

## 2016-01-14 NOTE — Telephone Encounter (Signed)
I've spoken w [name] and she's agreeable to trying patient assistance enrollment. I've printed enrollment form, which she states she will pick up today and return to [place] for MD signature, etc once patient portion is completed.

## 2016-01-14 NOTE — Telephone Encounter (Signed)
Confirmed remote transmission w/ pt daughter.   

## 2016-01-14 NOTE — Telephone Encounter (Signed)
sw pt dtr to confirm 1/4 appt date/time per LOS

## 2016-01-14 NOTE — Telephone Encounter (Signed)
Daughter received message to call  back to schedule appt with Gudena. appt request sent.

## 2016-01-19 NOTE — Assessment & Plan Note (Signed)
DCIS left breast ER 100% PR 40%, 8.6 area of calcifications status post biopsy on 06/30/2014 Recommendation:Patient is not a candidate for surgery because of her CHF/pacemaker Current treatment: Anastrozole 1 mg daily started June 2016 discontinued on 12 2017 due to polypharmacy issues  Hospitalizations in March and June 2017  I discussed with the family that patient could not be seen on an as-needed basis. Given her advanced age and multiple comorbidities, no further medical oncology follow-up is necessary.

## 2016-01-20 ENCOUNTER — Ambulatory Visit (HOSPITAL_BASED_OUTPATIENT_CLINIC_OR_DEPARTMENT_OTHER): Payer: Medicare HMO | Admitting: Hematology and Oncology

## 2016-01-20 ENCOUNTER — Encounter: Payer: Self-pay | Admitting: Hematology and Oncology

## 2016-01-20 ENCOUNTER — Telehealth: Payer: Self-pay | Admitting: *Deleted

## 2016-01-20 DIAGNOSIS — N644 Mastodynia: Secondary | ICD-10-CM | POA: Diagnosis not present

## 2016-01-20 DIAGNOSIS — C50112 Malignant neoplasm of central portion of left female breast: Secondary | ICD-10-CM

## 2016-01-20 DIAGNOSIS — I509 Heart failure, unspecified: Secondary | ICD-10-CM

## 2016-01-20 DIAGNOSIS — N63 Unspecified lump in unspecified breast: Secondary | ICD-10-CM | POA: Diagnosis not present

## 2016-01-20 DIAGNOSIS — Z170 Estrogen receptor positive status [ER+]: Principal | ICD-10-CM

## 2016-01-20 DIAGNOSIS — D0512 Intraductal carcinoma in situ of left breast: Secondary | ICD-10-CM | POA: Diagnosis not present

## 2016-01-20 NOTE — Progress Notes (Signed)
Patient Care Team: [name], MD as PCP - General (Internal Medicine)  DIAGNOSIS:  Encounter Diagnosis  Name Primary?  . 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, estrogen receptor positive ([place])     SUMMARY OF ONCOLOGIC HISTORY:   Cancer of central portion of left female breast ([place])   06/30/2014 Initial Diagnosis    Left breast biopsy: DCIS ER 100%, PR 40%, 8.6 cm area of grouped segmental calcifications      07/30/2014 - 01/28/2015 Anti-estrogen oral therapy    Anastrozole 1 mg every other day palliative treatment      03/29/2015 - 03/31/2015 Hospital Admission    Hospitalization for generalized weakness, nausea and vomiting, syncope      06/24/2015 - 07/01/2015 Hospital Admission    Hospitalization for atypical chest pain/NSTEMI       CHIEF COMPLIANT: Complaining of left breast swelling and pain  INTERVAL HISTORY: [name] is a 83 year old with above-mentioned history of DCIS left breast was not a surgical candidate and took anastrozole for approximately 6 months and stopped anastrozole in January 2017. Because of her elderly age and health issues, we thought she does not need follow-ups with [place]. Recently she has noted increasing swelling and pain in the left breast. Because of this we obtained a mammogram. The mammogram revealed no change to the DCIS. There was slight increase in edema most likely due to congestive heart failure and her body positioning. She is here today accompanied by her family to discuss the mammogram report. Patient is mostly blind and stays in a wheelchair or in her bed. Because of the sores on the bottom she prefers to lay down in the left lateral position. It is for this reason we suspect the left breast is more swollen than the right.  REVIEW OF SYSTEMS:   Constitutional: Denies fevers, chills or abnormal weight loss Eyes: Denies blurriness of vision Ears, nose, mouth, throat, and face: Denies mucositis or sore  throat Respiratory: Denies cough, dyspnea or wheezes Cardiovascular: History of congestive heart failure Gastrointestinal:  Denies nausea, heartburn or change in bowel habits Skin: Denies abnormal skin rashes Lymphatics: Denies new lymphadenopathy or easy bruising Neurological: Severe generalized weakness Behavioral/Psych: Mood is stable, no new changes  Extremities: 2+ lower extremity edema Breast: Left breast pain and swelling All other systems were reviewed with the patient and are negative.  I have reviewed the past medical history, past surgical history, social history and family history with the patient and they are unchanged from previous note.  ALLERGIES:  is allergic to darvon; digoxin and related; penicillins; percocet [oxycodone-acetaminophen]; percodan [oxycodone-aspirin]; and vicodin [hydrocodone-acetaminophen].  MEDICATIONS:  Current Outpatient Prescriptions  Medication Sig Dispense Refill  . acetaminophen (TYLENOL) 500 MG tablet Take 500 mg by mouth every 6 (six) hours as needed (pain).    Marland Kitchen albuterol (PROAIR HFA) 108 (90 Base) MCG/ACT inhaler Inhale 2 puffs into the lungs every 6 (six) hours as needed for wheezing or shortness of breath.    Marland Kitchen atropine 1 % ophthalmic solution Place 1 drop into both eyes daily as needed (burning eyes).   0  . Blood Glucose Monitoring Suppl (ONE TOUCH ULTRA 2) w/Device KIT Use to check blood sugar 2 times per day dx code E11.65 1 each 0  . cholecalciferol (VITAMIN D) 1000 units tablet Take 1,000 Units by mouth daily. Reported on 07/30/2015    . ciprofloxacin (CIPRO) 250 MG tablet Take 250 mg by mouth 2 (two) times daily.  0  .  diclofenac sodium (VOLTAREN) 1 % GEL Apply 4 g topically 3 (three) times daily. To right knee 1 Tube 3  . donepezil (ARICEPT) 5 MG tablet Take 5 mg by mouth at bedtime.   0  . folic acid (FOLVITE) 1 MG tablet take 1 tablet by mouth once daily 30 tablet 9  . furosemide (LASIX) 40 MG tablet Take 1 tablet (40 mg total) by  mouth daily. 30 tablet 11  . gabapentin (NEURONTIN) 100 MG capsule Take 100 mg by mouth 2 (two) times daily.     Marland Kitchen glipiZIDE (GLUCOTROL) 5 MG tablet Take 1/2 pill daily 45 tablet 1  . guaifenesin (MUCUS RELIEF CHEST CONGESTION) 400 MG TABS tablet Take 400 mg by mouth 2 (two) times daily as needed (cough/ congestion).    Marland Kitchen JANUVIA 50 MG tablet take 1 tablet by mouth once daily 30 tablet 1  . levothyroxine (SYNTHROID) 125 MCG tablet Take 0.5 tablets (62.5 mcg total) by mouth daily. 45 tablet 1  . loratadine (CLARITIN) 10 MG tablet Take 10 mg by mouth daily.    Marland Kitchen lubiprostone (AMITIZA) 8 MCG capsule Take 1 capsule (8 mcg total) by mouth 2 (two) times daily with a meal. 60 capsule 3  . meclizine (ANTIVERT) 25 MG tablet Take 25 mg by mouth 3 (three) times daily as needed for dizziness.    . nitroGLYCERIN (NITRODUR - DOSED IN MG/24 HR) 0.1 mg/hr patch Place 1 patch (0.1 mg total) onto the skin daily. 30 patch 11  . omeprazole (PRILOSEC) 20 MG capsule Take 20 mg by mouth daily before breakfast.     . ONE TOUCH ULTRA TEST test strip TEST TWICE DAILY 100 each 2  . OVER THE COUNTER MEDICATION Take 1 Can by mouth daily. Enterex nutritional supplement - with choline    . OVER THE COUNTER MEDICATION Take 1 tablet by mouth 2 (two) times daily with a meal. FD Guard    . OVER THE COUNTER MEDICATION Take 1 tablet by mouth daily. Micro-algae DHA    . OVER THE COUNTER MEDICATION Take 1 capsule by mouth daily. Choline 100 mg, trimethylglycine 50 mg, glutathione 50 mg, methyltetrahydrofolate, l-arginine-, l-ornithine, l-citruline    . Polyvinyl Alcohol-Povidone (REFRESH OP) Place 1 drop into both eyes daily as needed (dry eyes).     . Potassium Chloride CR (MICRO-K) 8 MEQ CPCR capsule CR Take 1 capsule (8 mEq total) by mouth daily. 30 capsule 6  . ranolazine ([name]) 500 MG 12 hr tablet Take 1 tablet (500 mg total) by mouth 2 (two) times daily. 180 tablet 3  . rosuvastatin (CRESTOR) 10 MG tablet Take 1 tablet (10 mg  total) by mouth daily. 30 tablet 5  . senna-docusate (SENOKOT S) 8.6-50 MG tablet Take 1 tablet by mouth 2 (two) times daily. For constipation 60 tablet 1  . traMADol (ULTRAM) 50 MG tablet Take 1 tablet (50 mg total) by mouth every 6 (six) hours as needed for moderate pain. (Patient taking differently: Take 50 mg by mouth 2 (two) times daily as needed for moderate pain. ) 30 tablet 0  . warfarin (COUMADIN) 3 MG tablet TAKE 1 TO 1 AND 1/2 TABLETS DAILY OR AS DIRECTED BY COUMADIN CLINIC 40 tablet 3  . zolpidem (AMBIEN) 5 MG tablet Take 0.5 tablets (2.5 mg total) by mouth at bedtime. sleep (Patient taking differently: Take 5 mg by mouth. sleep) 30 tablet 0   No current facility-administered medications for this visit.     PHYSICAL EXAMINATION: ECOG PERFORMANCE STATUS: 3 -  Symptomatic, >50% confined to bed  Vitals:   01/20/16 1525  BP: 115/63  Pulse: 67  Resp: 16  Temp: 98 F (36.7 C)   Filed Weights   01/20/16 1525  Weight: 175 lb 6.4 oz (79.6 kg)    GENERAL:alert, no distress and comfortable SKIN: skin color, texture, turgor are normal, no rashes or significant lesions EYES: normal, Conjunctiva are pink and non-injected, sclera clear OROPHARYNX:no exudate, no erythema and lips, buccal mucosa, and tongue normal  NECK: supple, thyroid normal size, non-tender, without nodularity LYMPH:  no palpable lymphadenopathy in the cervical, axillary or inguinal LUNGS: Fine crackles at bases HEART: regular rate & rhythm and no murmurs  ABDOMEN:abdomen soft, non-tender and normal bowel sounds MUSCULOSKELETAL:no cyanosis of digits and no clubbing  NEURO: alert & oriented x 3 with fluent speech, generalized weakness EXTREMITIES: 2+ lower extremity edema BREAST: No palpable masses in the left or right breast. Left breast is slightly more swollen. It is also tender to deep palpation.. No palpable axillary supraclavicular or infraclavicular adenopathy no breast tenderness or nipple discharge. (exam  performed in the presence of a chaperone)  LABORATORY DATA:  I have reviewed the data as listed   Chemistry      Component Value Date/Time   NA 141 12/01/2015 1543   K 3.7 12/01/2015 1543   CL 105 12/01/2015 1543   CO2 31 12/01/2015 1543   BUN 16 12/01/2015 1543   CREATININE 1.14 12/01/2015 1543   CREATININE 1.16 (H) 10/25/2015 1118      Component Value Date/Time   CALCIUM 8.9 12/01/2015 1543   ALKPHOS 86 10/25/2015 1118   AST 15 10/25/2015 1118   ALT 6 10/25/2015 1118   BILITOT 0.6 10/25/2015 1118       Lab Results  Component Value Date   WBC 6.1 07/01/2015   HGB 9.7 (L) 07/01/2015   HCT 30.8 (L) 07/01/2015   MCV 89.0 07/01/2015   PLT 140 (L) 07/01/2015   NEUTROABS 4.9 06/24/2015    ASSESSMENT & PLAN:  Cancer of central portion of left female breast ([place]) DCIS left breast ER 100% PR 40%, 8.6 area of calcifications status post biopsy on 06/30/2014 Recommendation:Patient is not a candidate for surgery because of her CHF/pacemaker Current treatment: Anastrozole 1 mg daily started June 2016 discontinued on 01/18/2015 due to polypharmacy issues  Left breast swelling and pain: Mammogram does not reveal any progression of DCIS. It is for this reason I did not recommend reinitiating therapy with anastrozole. After much discussion we determined that the cause of the left breast swelling is related to congestive heart failure and her body positioning most of the day when she lies down he is in the left lateral side. We discussed different ways that she can sleep on the right lateral side alternating with the left side throughout the day.  Hospitalizations in March and June 2017  I made an appointment for her to come back to see me in a year but given her age I also instructed the family that she does not have to be seen. They preferred that we make an appointment in the year and that they would call if they need to cancel it.  No orders of the defined types were placed in this  encounter.  The patient has a good understanding of the overall plan. she agrees with it. she will call with any problems that may develop before the next visit here.   Rulon Eisenmenger, MD 01/20/16

## 2016-01-20 NOTE — Telephone Encounter (Signed)
FYI "Has the nurse or the doctor read mom's mammogram report yet or do we need to reschedule due to the weather.  She is wheelchair bound." Mammogram on 01-03-2016 reads patient needs visual exam by provider and possible further testing due to wheel chair bound.  Advised the [place] area has no evidence of snow or any dusting as some areas and side roads in other areas of [place].  With this information says she will keep today's appointment at 3:00 as scheduled.

## 2016-01-21 ENCOUNTER — Encounter: Payer: Self-pay | Admitting: Cardiology

## 2016-01-21 ENCOUNTER — Other Ambulatory Visit: Payer: Self-pay | Admitting: *Deleted

## 2016-01-28 ENCOUNTER — Ambulatory Visit (INDEPENDENT_AMBULATORY_CARE_PROVIDER_SITE_OTHER): Payer: Medicare HMO | Admitting: Pharmacist

## 2016-01-28 DIAGNOSIS — Z7901 Long term (current) use of anticoagulants: Secondary | ICD-10-CM

## 2016-01-28 LAB — PROTIME-INR: INR: 2.1 — AB (ref <=1.1)

## 2016-02-09 ENCOUNTER — Other Ambulatory Visit: Payer: Self-pay | Admitting: Physician Assistant

## 2016-02-11 ENCOUNTER — Ambulatory Visit (INDEPENDENT_AMBULATORY_CARE_PROVIDER_SITE_OTHER): Payer: Medicare HMO | Admitting: Pharmacist Clinician (PhC)/ Clinical Pharmacy Specialist

## 2016-02-11 DIAGNOSIS — Z7901 Long term (current) use of anticoagulants: Secondary | ICD-10-CM

## 2016-02-11 LAB — POCT INR: INR: 2.5

## 2016-02-16 ENCOUNTER — Other Ambulatory Visit: Payer: Self-pay | Admitting: Cardiovascular Disease

## 2016-02-17 ENCOUNTER — Telehealth: Payer: Self-pay | Admitting: Endocrinology

## 2016-02-17 NOTE — Telephone Encounter (Signed)
Patient stated insurance will not cover cover only a Accu Chek Meter and need test strips.  Pharmacy sent a request.  [facility] [address] [place], [place] [phone] (Phone) [phone] (Fax)

## 2016-02-18 NOTE — Telephone Encounter (Signed)
Called patient message unclear whether insurance will only cover accu-chek meter or they will not cover it requested a return call to [facility]

## 2016-02-21 ENCOUNTER — Telehealth: Payer: Self-pay | Admitting: Pharmacist

## 2016-02-21 NOTE — Telephone Encounter (Signed)
Patient was NOT initiated on antibiotics and HH order changed to visit every other week.    Next INR scheduled for 02/24/16

## 2016-02-22 NOTE — Telephone Encounter (Signed)
Patient insurance will only cover Accu check meter and the test strip.  Send to   [facility] [address] [place], [place] - [place] [phone] (Phone) [phone] (Fax)   She request a call back

## 2016-02-24 LAB — PROTIME-INR: INR: 2 — AB (ref <=1.1)

## 2016-02-25 ENCOUNTER — Other Ambulatory Visit: Payer: Self-pay

## 2016-02-25 ENCOUNTER — Ambulatory Visit (INDEPENDENT_AMBULATORY_CARE_PROVIDER_SITE_OTHER): Payer: Medicare HMO | Admitting: Pharmacist

## 2016-02-25 DIAGNOSIS — Z7901 Long term (current) use of anticoagulants: Secondary | ICD-10-CM

## 2016-02-25 MED ORDER — GLUCOSE BLOOD VI STRP: ORAL_STRIP | 11 refills | Status: DC

## 2016-02-25 MED ORDER — ACCU-CHEK FASTCLIX LANCETS MISC: 11 refills | Status: DC

## 2016-02-25 MED ORDER — ACCU-CHEK AVIVA PLUS W/DEVICE KIT: PACK | 0 refills | Status: DC

## 2016-02-25 NOTE — Telephone Encounter (Signed)
She can have the Accu-Chek Aviva plus

## 2016-02-25 NOTE — Telephone Encounter (Signed)
Called an notified sitter that we had submitted a new meter, strips, and lancets. No questions at this time.

## 2016-02-28 ENCOUNTER — Telehealth: Payer: Self-pay | Admitting: Cardiovascular Disease

## 2016-02-28 NOTE — Telephone Encounter (Signed)
Spoke to [place], patient's daughter. States patient has been having chest pains off and on, "for about a week". No SOB, denies other new symptoms. Had brief episode this morning. States the chest discomfort has been going on a while longer, cannot specify time frame, but that it's become more frequent in the last week. She notes patient has a history of chronic pain, neuropathy and thought it might be attributed to this, but wanted to make sure. No URI, cough, congestion, fever.  States that the patient also had an episode 1 time a few days ago where she became "a little incoherent", daughter called paramedics out of caution. Patient was initially a little hard to wake up, but then "just snapped out of it and was fine". She declined transport to hospital for further evaluation at that time.  Offered same day evaluation. Daughter states not available at times offered today, AM appt tomorrow made w Dr. Sallyanne Kuster. She voiced thanks, aware to call in interim if new concerns.

## 2016-02-28 NOTE — Telephone Encounter (Signed)
Patient daughter you to give her a call concerning her mom blood sugars [phone]

## 2016-02-28 NOTE — Telephone Encounter (Signed)
Pt's dtr calling to see if pt can get her device checked while in tomorrow? pls call

## 2016-02-28 NOTE — Telephone Encounter (Signed)
Please advise if her Medtronic Adapta can be interrogated at her acute add-in appointment tomorrow.

## 2016-02-28 NOTE — Telephone Encounter (Signed)
Requested a call back to further discuss.  

## 2016-02-28 NOTE — Telephone Encounter (Signed)
New message   Pt daughter verbalized   Pt c/o of Chest Pain: STAT if CP now or developed within 24 hours  1. Are you having CP right now? No  02/28/16 @5am   2. Are you experiencing any other symptoms (ex. SOB, nausea, vomiting, sweating)? sweating  3. How long have you been experiencing CP? Last week  4. Is your CP continuous or coming and going? coming and going  5. Have you taken Nitroglycerin? She uses the patch ?

## 2016-02-29 ENCOUNTER — Ambulatory Visit (INDEPENDENT_AMBULATORY_CARE_PROVIDER_SITE_OTHER): Payer: Medicare HMO | Admitting: Cardiovascular Disease

## 2016-02-29 ENCOUNTER — Encounter: Payer: Self-pay | Admitting: Cardiovascular Disease

## 2016-02-29 VITALS — BP 98/58 | HR 61 | Ht 64.0 in | Wt 172.0 lb

## 2016-02-29 DIAGNOSIS — I2510 Atherosclerotic heart disease of native coronary artery without angina pectoris: Secondary | ICD-10-CM | POA: Insufficient documentation

## 2016-02-29 DIAGNOSIS — Z950 Presence of cardiac pacemaker: Secondary | ICD-10-CM

## 2016-02-29 DIAGNOSIS — I480 Paroxysmal atrial fibrillation: Secondary | ICD-10-CM | POA: Diagnosis not present

## 2016-02-29 DIAGNOSIS — I442 Atrioventricular block, complete: Secondary | ICD-10-CM

## 2016-02-29 DIAGNOSIS — E7800 Pure hypercholesterolemia, unspecified: Secondary | ICD-10-CM | POA: Diagnosis not present

## 2016-02-29 DIAGNOSIS — I5042 Chronic combined systolic (congestive) and diastolic (congestive) heart failure: Secondary | ICD-10-CM | POA: Diagnosis not present

## 2016-02-29 DIAGNOSIS — I25718 Atherosclerosis of autologous vein coronary artery bypass graft(s) with other forms of angina pectoris: Secondary | ICD-10-CM

## 2016-02-29 NOTE — Patient Instructions (Addendum)
Dr Sallyanne Kuster has recommended making the following medication changes: CHANGE the way you take Furosemide: -Take Furosemide 40 mg (1 tablet) by mouth on Mondays, Wednesdays, and Fridays -Take Furosemide 20 mg (0.5 tablet) by mouth on Tuesdays, Thursdays, Saturdays, and Sundays  Weigh daily. Call the office if your weight is over 212 pounds by your home scale.  Remote monitoring is used to monitor your Pacemaker of ICD from home. This monitoring reduces the number of office visits required to check your device to one time per year. It allows [place] to keep an eye on the functioning of your device to ensure it is working properly. You are scheduled for a device check from home on Tuesday, May 15th, 2018. You may send your transmission at any time that day. If you have a wireless device, the transmission will be sent automatically. After your physician reviews your transmission, you will receive a postcard with your next transmission date.  Dr Sallyanne Kuster recommends that you schedule a follow-up appointment in 6 months with a pacemaker check. You will receive a reminder letter in the mail two months in advance. If you don't receive a letter, please call our office to schedule the follow-up appointment.  If you need a refill on your cardiac medications before your next appointment, please call your pharmacy.

## 2016-02-29 NOTE — Progress Notes (Signed)
Cardiology Office Note    Date:  02/29/2016   ID:  ADALAY Torres, DOB 25-Jan-1920, MRN [id number]  PCP:  [name], MD  Cardiologist:   Sanda Klein, MD   Chief complaint: low BP   History of Present Illness:  [name] is a 83 y.o. 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  She continues to have intermittent episodes of chest discomfort that are not related to activity (she is extremely sedentary, spends all day in bed but transfers to a commode with assistance). Difficult to say whether these represent angina. Antianginal therapy is limited by hypotension.  In fact, her blood pressure has been quite low recently. Last week she was very disoriented. Her daughter checked her blood pressure in the systolic was only 64 mmHg, the patient was sleepy and mumbling. After stimulating her, her blood pressure increased to the 90s and she became a little more alert. She denies any problems with shortness of breath and her edema is gone. Her weight has been 205-209 on the home scale that allows her to be weighed on her wheelchair. The wheelchair itself weighs 35 pounds.  She was last hospitalized in June with a small NSTEMI, treated conservatively in view of advanced age and renal insufficiency. The ventricular systolic function was unchanged at 45-50%.   She does have a history of GI bleeding, none recently. She does not have edema, orthopnea or PND.   Pacemaker interrogation shows normal device function. The lead parameters are excellent. Generator longevity is 10.5 years. She has had a handful of episodes of  atrial fibrillation, the longest lasting for 21 minutes. She has only 0.6% atrial pacing and 98.4% ventricular pacing. There is one relatively lengthy episode of nonsustained VT at 192 bpm, lasting for almost 6 seconds/18 beats.   Past Medical History:  Diagnosis Date  . 2nd degree atrioventricular block    a. 06/22/15 Gen change: MDT ADDR01 Adapta, DC PPM (ser# [id number]).  . Allergy to perfume   . Anemia   . Arthritis   . Chronic combined systolic and diastolic CHF (congestive heart failure) ([place])    a. 01/2014 Echo: EF @ least mod-sev reduced with HK of lat/apical, basal inf walls. basalpost AK, Gr 1DD; b. 06/2015 Echo: EF 45-50%, Gr2 DD, mod LVH.   . CKD (chronic kidney disease), stage III    a. iii - iv.  . Coronary artery disease    a. 1994 s/p cabg;  b. 09/2013 MV: large, sev intensity, partially reversible inf, apical defect, prior inf/ap infarct w/ mild peri-infarct ischemia->Med Rx; c. 06/2015 NSTEMI (trop 6)->Med rx.  . Daily headache   . DVT of upper extremity (deep vein thrombosis) ([place]) 06/13/2012   BUE  . Dyspepsia   . Gastric ulcer   . GERD (gastroesophageal reflux disease)   . H/O: GI bleed 12//13  . Hiatal hernia   . High cholesterol   . History of blood transfusion 2013  . Hypertensive heart disease   . Hypothyroid   . Legally blind   . PAF (paroxysmal atrial fibrillation) ([facility])    a. CHA2DS2VASc = 7-->coumadin.  . Presence of permanent cardiac pacemaker    a. 06/22/15 Gen change: MDT ADDR01 Adapta, DC PPM (  ser# [id number]).  . Seasonal allergies   . Type II diabetes mellitus ([place])   . UTI (lower urinary tract infection)     Past Surgical History:  Procedure Laterality Date  . CARDIAC CATHETERIZATION  10/25/92  . CARDIAC CATHETERIZATION  11/18/03   w/grafts 100%CX LAD 80 & 100%  . CARDIAC CATHETERIZATION  01/24/05   diffuse disease of native vessels  . CARDIAC CATHETERIZATION  06/06/06   severe native CAD  . CARDIOVERSION  06/06/06   successful  . CATARACT  EXTRACTION W/ INTRAOCULAR LENS  IMPLANT, BILATERAL Bilateral   . CORONARY ARTERY BYPASS GRAFT  10/27/92   LIMA to LAD,SVG to LAD second diagonal,obtuse maraginal of the CX and posterior descendingbranch of the RCA  . EP IMPLANTABLE DEVICE N/A 06/22/2015   Procedure: PPM/BIV PPM Generator Changeout;  Surgeon: Sanda Klein, MD;  Location: [facility];  Service: Cardiovascular;  Laterality: N/A;  . ESOPHAGOGASTRODUODENOSCOPY  12/22/2011   Procedure: ESOPHAGOGASTRODUODENOSCOPY (EGD);  Surgeon: Gatha Mayer, MD;  Location: [facility];  Service: Endoscopy;  Laterality: N/A;  . INSERT / REPLACE / REMOVE PACEMAKER  06/29/2006   Medtronic adapta  . REFRACTIVE SURGERY Bilateral     Current Medications: Outpatient Medications Prior to Visit  Medication Sig Dispense Refill  . ACCU-CHEK FASTCLIX LANCETS MISC Use to check sugar 2 times daily 200 each 11  . acetaminophen (TYLENOL) 500 MG tablet Take 500 mg by mouth every 6 (six) hours as needed (pain).    Marland Kitchen albuterol (PROAIR HFA) 108 (90 Base) MCG/ACT inhaler Inhale 2 puffs into the lungs every 6 (six) hours as needed for wheezing or shortness of breath.    Marland Kitchen atropine 1 % ophthalmic solution Place 1 drop into both eyes daily as needed (burning eyes).   0  . Blood Glucose Monitoring Suppl (ACCU-CHEK AVIVA PLUS) w/Device KIT Use to check sugars 2 times daily. 1 kit 0  . cholecalciferol (VITAMIN D) 1000 units tablet Take 1,000 Units by mouth daily. Reported on 07/30/2015    . ciprofloxacin (CIPRO) 250 MG tablet Take 250 mg by mouth 2 (two) times daily.  0  . diclofenac sodium (VOLTAREN) 1 % GEL Apply 4 g topically 3 (three) times daily. To right knee 1 Tube 3  . donepezil (ARICEPT) 5 MG tablet Take 5 mg by mouth at bedtime.   0  . folic acid (FOLVITE) 1 MG tablet take 1 tablet by mouth once daily 30 tablet 9  . furosemide (LASIX) 40 MG tablet Take 1 tablet (40 mg total) by mouth daily. 30 tablet 11  . gabapentin (NEURONTIN) 100 MG capsule Take 100 mg  by mouth 2 (two) times daily.     Marland Kitchen glipiZIDE (GLUCOTROL) 5 MG tablet Take 1/2 pill daily 45 tablet 1  . glucose blood (ACCU-CHEK AVIVA PLUS) test strip Use as instructed to check sugar 2 times daily. 200 each 11  . guaifenesin (MUCUS RELIEF CHEST CONGESTION) 400 MG TABS tablet Take 400 mg by mouth 2 (two) times daily as needed (cough/ congestion).    Marland Kitchen JANUVIA 50 MG tablet take 1 tablet by mouth once daily 30 tablet 1  . levothyroxine (SYNTHROID) 125 MCG tablet Take 0.5 tablets (62.5 mcg total) by mouth daily. 45 tablet 1  . loratadine (CLARITIN) 10 MG tablet Take 10 mg by mouth daily.    Marland Kitchen lubiprostone (AMITIZA) 8 MCG capsule Take 1 capsule (8 mcg total) by mouth 2 (two) times daily with a meal. 60 capsule 3  . meclizine (ANTIVERT) 25  MG tablet Take 25 mg by mouth 3 (three) times daily as needed for dizziness.    . nitroGLYCERIN (NITRODUR - DOSED IN MG/24 HR) 0.1 mg/hr patch Place 1 patch (0.1 mg total) onto the skin daily. 30 patch 11  . omeprazole (PRILOSEC) 20 MG capsule Take 1 capsule (20 mg total) by mouth 2 (two) times daily before a meal. 60 capsule 11  . OVER THE COUNTER MEDICATION Take 1 Can by mouth daily. Enterex nutritional supplement - with choline    . OVER THE COUNTER MEDICATION Take 1 tablet by mouth 2 (two) times daily with a meal. FD Guard    . OVER THE COUNTER MEDICATION Take 1 tablet by mouth daily. Micro-algae DHA    . OVER THE COUNTER MEDICATION Take 1 capsule by mouth daily. Choline 100 mg, trimethylglycine 50 mg, glutathione 50 mg, methyltetrahydrofolate, l-arginine-, l-ornithine, l-citruline    . Polyvinyl Alcohol-Povidone (REFRESH OP) Place 1 drop into both eyes daily as needed (dry eyes).     . Potassium Chloride CR (MICRO-K) 8 MEQ CPCR capsule CR Take 1 capsule (8 mEq total) by mouth daily. 30 capsule 6  . ranolazine ([name]) 500 MG 12 hr tablet Take 1 tablet (500 mg total) by mouth 2 (two) times daily. 180 tablet 3  . rosuvastatin (CRESTOR) 10 MG tablet Take 1 tablet  (10 mg total) by mouth daily. 30 tablet 5  . senna-docusate (SENOKOT S) 8.6-50 MG tablet Take 1 tablet by mouth 2 (two) times daily. For constipation 60 tablet 1  . traMADol (ULTRAM) 50 MG tablet Take 1 tablet (50 mg total) by mouth every 6 (six) hours as needed for moderate pain. (Patient taking differently: Take 50 mg by mouth 2 (two) times daily as needed for moderate pain. ) 30 tablet 0  . warfarin (COUMADIN) 3 MG tablet TAKE 1 TO 1 AND 1/2 TABLETS DAILY OR AS DIRECTED BY COUMADIN CLINIC 40 tablet 3  . zolpidem (AMBIEN) 5 MG tablet Take 0.5 tablets (2.5 mg total) by mouth at bedtime. sleep (Patient taking differently: Take 5 mg by mouth. sleep) 30 tablet 0  . omeprazole (PRILOSEC) 20 MG capsule Take 20 mg by mouth daily before breakfast.      No facility-administered medications prior to visit.      Allergies:   Darvon; Digoxin and related; Penicillins; Percocet [oxycodone-acetaminophen]; Percodan [oxycodone-aspirin]; and Vicodin [hydrocodone-acetaminophen]   Social History   Social History  . Marital status: Widowed    Spouse name: N/A  . Number of children: 10  . Years of education: N/A   Occupational History  .  Retired   Social History Main Topics  . Smoking status: Never Smoker  . Smokeless tobacco: Never Used  . Alcohol use No  . Drug use: No  . Sexual activity: No   Other Topics Concern  . Not on file   Social History Narrative  . No narrative on file     Family History:  The patient's family history includes Breast cancer in her daughter; Colon polyps in her daughter; Diabetes in her brother, daughter, father, and son; Heart attack in her brother; Heart disease in her father; Stroke in her sister.   ROS:   Please see the history of present illness.    ROS All other systems reviewed and are negative.   PHYSICAL EXAM:   VS:  BP (!) 98/58   Pulse 61   Ht _0  (1.626 m)   Wt 78 kg (172 lb)   BMI 29.52 kg/m  GEN: overweight, well developed, in no acute  distress  HEENT: normal . She is legally blind. Neck: no JVD, carotid bruits, or masses Cardiac: Healthy pacemaker site, paradoxically split second heart sound, RRR; no murmurs, rubs, or gallops,no edema  Respiratory:  clear to auscultation bilaterally, normal work of breathing GI: soft, nontender, nondistended, + BS MS: no deformity or atrophy  Skin: warm and dry, no rash Neuro:  Alert and Oriented x 3, Strength and sensation are intact Psych: euthymic mood, full affect  Wt Readings from Last 3 Encounters:  02/29/16 78 kg (172 lb)  01/20/16 79.6 kg (175 lb 6.4 oz)  12/01/15 77.8 kg (171 lb 8 oz)      Studies/Labs Reviewed:   EKG:  EKG is ordered today.  ECG shows atrial sensed, ventricular paced rhythm with a tall positive R wave in leads V1 and V2, QRS 160 form of seconds, QTC 558 ms.  The intracardiac electrogram today demonstrates A sensed V paced rhythm  Recent Labs: 06/24/2015: Magnesium 2.0 06/26/2015: B Natriuretic Peptide 252.8 07/01/2015: Hemoglobin 9.7; Platelets 140 10/25/2015: ALT 6 12/01/2015: BUN 16; Creatinine, Ser 1.14; Potassium 3.7; Sodium 141; TSH 2.02   Lipid Panel    Component Value Date/Time   CHOL 222 (H) 12/01/2015 1543   TRIG 244.0 (H) 12/01/2015 1543   HDL 32.20 (L) 12/01/2015 1543   CHOLHDL 7 12/01/2015 1543   VLDL 48.8 (H) 12/01/2015 1543   LDLCALC 141 (H) 10/25/2015 1118   LDLDIRECT 133.0 12/01/2015 1543     ASSESSMENT:    1. Chronic combined systolic and diastolic CHF (congestive heart failure) ([place])   2. PAF (paroxysmal atrial fibrillation) ([place])   3. Complete heart block ([place])   4. Pacemaker   5. Coronary artery disease involving autologous vein coronary bypass graft with other forms of angina pectoris ([place])   6. Hypercholesterolemia      PLAN:  In order of problems listed above:  1. CHF: Clinically she appears to be mildly hypovolemic. We'll decrease the furosemide slightly (20 mg 4 days a week, 40 mg 3 days a week). She has a  special home scale which allows weighing on a wheelchair, and this is invaluable in management of her heart failure. She is asked to call us if she weighs more than 212 on the scale (which would correspond to a net weight of 177lb). Low blood pressure precludes the use of ACE inhibitor/angiotensin receptor blockers or beta blockers. 2. Afib: Episodes are infrequent and brief since her last device check. It's unlikely they are responsible for any of her symptoms. She has very high embolic risk. CHADSVasc 34 (age 89, CVA 2, CHF, CAD, previous HTN, DM). Continue warfarin. Aspirin stopped due to history of GI bleeding. 3. CHB: Pacemaker dependent. Normal device function. 4. PPM: Remote download in 3 months and office visit in 6 months. Unusual to have positive R waves in lead V1, V2. Reviewed radiological images and the right ventricular lead is clearly in the right ventricular cavity, not the epicardial veins. It's possible that the tip of the lead is located epicardially/microperforation. 5. CAD s/p CABG: She does have angina pectoris, known reversible ischemia on nuclear stress test and had a recent non-STEMI but because of her advanced age and comorbid conditions we are pursuing medical management only. Only options for antianginal therapy are [name] and low dose nitroglycerin patch, because of her low blood pressure. Aspirin has been stopped since she is also on warfarin and has had serious GI bleeding requiring transfusion in  the past. 6. HLP: On statin with fair lipid profile. Labs in November were taken when her statin was interrupted. Since then she has restarted rosuvastatin 10 mg daily. Recheck lipids at next appointment.     Medication Adjustments/Labs and Tests Ordered: Current medicines are reviewed at length with the patient today.  Concerns regarding medicines are outlined above.  Medication changes, Labs and Tests ordered today are listed in the Patient Instructions below. Patient Instructions   Dr Sallyanne Kuster has recommended making the following medication changes: CHANGE the way you take Furosemide: -Take Furosemide 40 mg (1 tablet) by mouth on Mondays, Wednesdays, and Fridays -Take Furosemide 20 mg (0.5 tablet) by mouth on Tuesdays, Thursdays, Saturdays, and Sundays  Weigh daily. Call the office if your weight is over 212 pounds by your home scale.  Remote monitoring is used to monitor your Pacemaker of ICD from home. This monitoring reduces the number of office visits required to check your device to one time per year. It allows [place] to keep an eye on the functioning of your device to ensure it is working properly. You are scheduled for a device check from home on Tuesday, May 15th, 2018. You may send your transmission at any time that day. If you have a wireless device, the transmission will be sent automatically. After your physician reviews your transmission, you will receive a postcard with your next transmission date.  Dr Sallyanne Kuster recommends that you schedule a follow-up appointment in 6 months with a pacemaker check. You will receive a reminder letter in the mail two months in advance. If you don't receive a letter, please call our office to schedule the follow-up appointment.  If you need a refill on your cardiac medications before your next appointment, please call your pharmacy.    Signed, Sanda Klein, MD  02/29/2016 11:01 AM    [address], [place], [place]  [address] Phone: [phone]; Fax: [phone]

## 2016-02-29 NOTE — Telephone Encounter (Signed)
Patient seen today by MCr. Pacemaker interrogated.

## 2016-03-01 LAB — CUP PACEART INCLINIC DEVICE CHECK
Brady Statistic AP VS Percent: 0 %
Brady Statistic AS VS Percent: 1 %
Date Time Interrogation Session: 20180213151142
Implantable Lead Implant Date: 20080613
Implantable Lead Location: 753859
Implantable Lead Location: 753860
Lead Channel Impedance Value: 646 Ohm
Lead Channel Pacing Threshold Pulse Width: 0.4 ms
Lead Channel Pacing Threshold Pulse Width: 0.4 ms
Lead Channel Setting Pacing Amplitude: 2.5 V
Lead Channel Setting Pacing Pulse Width: 0.4 ms
Lead Channel Setting Sensing Sensitivity: 5.6 mV
MDC IDC LEAD IMPLANT DT: 20080613
MDC IDC MSMT BATTERY IMPEDANCE: 100 Ohm
MDC IDC MSMT BATTERY REMAINING LONGEVITY: 127 mo
MDC IDC MSMT BATTERY VOLTAGE: 2.8 V
MDC IDC MSMT LEADCHNL RA IMPEDANCE VALUE: 424 Ohm
MDC IDC MSMT LEADCHNL RA PACING THRESHOLD AMPLITUDE: 1 V
MDC IDC MSMT LEADCHNL RV PACING THRESHOLD AMPLITUDE: 0.75 V
MDC IDC PG IMPLANT DT: 20170606
MDC IDC SET LEADCHNL RA PACING AMPLITUDE: 2 V
MDC IDC STAT BRADY AP VP PERCENT: 3 %
MDC IDC STAT BRADY AS VP PERCENT: 97 %

## 2016-03-02 ENCOUNTER — Encounter: Payer: Self-pay | Admitting: Endocrinology

## 2016-03-02 ENCOUNTER — Ambulatory Visit: Payer: PPO | Admitting: Endocrinology

## 2016-03-02 ENCOUNTER — Ambulatory Visit (INDEPENDENT_AMBULATORY_CARE_PROVIDER_SITE_OTHER): Payer: Medicare HMO | Admitting: Endocrinology

## 2016-03-02 VITALS — BP 108/60 | HR 71 | Ht 64.0 in

## 2016-03-02 DIAGNOSIS — E782 Mixed hyperlipidemia: Secondary | ICD-10-CM | POA: Diagnosis not present

## 2016-03-02 DIAGNOSIS — E1165 Type 2 diabetes mellitus with hyperglycemia: Secondary | ICD-10-CM | POA: Diagnosis not present

## 2016-03-02 DIAGNOSIS — E1142 Type 2 diabetes mellitus with diabetic polyneuropathy: Secondary | ICD-10-CM

## 2016-03-02 DIAGNOSIS — E063 Autoimmune thyroiditis: Secondary | ICD-10-CM

## 2016-03-02 DIAGNOSIS — E038 Other specified hypothyroidism: Secondary | ICD-10-CM | POA: Diagnosis not present

## 2016-03-02 LAB — COMPREHENSIVE METABOLIC PANEL
ALBUMIN: 3.4 g/dL — AB (ref 3.5–5.2)
ALT: 8 U/L (ref 0–35)
AST: 15 U/L (ref 0–37)
Alkaline Phosphatase: 91 U/L (ref 39–117)
BUN: 23 mg/dL (ref 6–23)
CHLORIDE: 104 meq/L (ref 96–112)
CO2: 33 mEq/L — ABNORMAL HIGH (ref 19–32)
Calcium: 8.9 mg/dL (ref 8.4–10.5)
Creatinine, Ser: 1.52 mg/dL — ABNORMAL HIGH (ref 0.40–1.20)
GFR: 40.98 mL/min — AB (ref >60.00)
Glucose, Bld: 124 mg/dL — ABNORMAL HIGH (ref 70–99)
Potassium: 4.3 mEq/L (ref 3.5–5.1)
SODIUM: 140 meq/L (ref 135–145)
Total Bilirubin: 0.6 mg/dL (ref 0.2–1.2)
Total Protein: 6.4 g/dL (ref 6.0–8.3)

## 2016-03-02 LAB — POCT GLYCOSYLATED HEMOGLOBIN (HGB A1C): HEMOGLOBIN A1C: 6.3

## 2016-03-02 LAB — LDL CHOLESTEROL, DIRECT: LDL DIRECT: 132 mg/dL

## 2016-03-02 LAB — TSH: TSH: 1.68 u[IU]/mL (ref 0.35–4.50)

## 2016-03-02 NOTE — Progress Notes (Signed)
Patient ID: [name], female   DOB: 1920-11-13, 83 y.o.   MRN: [id number]   Reason for Appointment: Diabetes follow-up   History of Present Illness   Diagnosis: Type 2 diabetes mellitus, date of diagnosis: 1972.   She had been on insulin for several years and this had been tapered off in 2013 because of weight loss and lower blood sugars. Subsequently blood sugars had been generally reasonably good.  Because of her age and multiple medical problems she has been monitored without insulin  Recent history:  Because of out-of-pocket expense that she cannot afford for Januvia she was started back on glipizide in 12/17 Previously had done well with Januvia which was started because of significantly high readings  Current management, blood sugar patterns and problems:  She did  bring her monitor for download   Not clear if she is using a Generic monitor currently and her daughter is not clear about what blood sugar patterns she is getting  She is currently taking only half a tablet of 5 mg glipizide and this is given after eating in the evening  She has not been checking her blood sugars after meals but again only before breakfast and suppertime  She thinks her blood sugars are only occasionally higher in the afternoon; more when she is eating something like ice cream  Also occasionally will have small amount of regular soft drink  Her weight has been relatively stable, does not eat large portions   Recent blood sugar readings by recall: Range 80-171  Meals: 2 meals at about noon and 7 pm; sometimes has lemonade or sprite. Has small portions, has some grapes for snacks in the afternoon  Dietician visit: Most recent:2001.     Wt Readings from Last 3 Encounters:  02/29/16 172 lb (78 kg)  01/20/16 175 lb 6.4 oz (79.6 kg)  12/01/15 171 lb 8 oz (77.8 kg)   Lab Results  Component Value Date   HGBA1C 6.3 03/02/2016   HGBA1C 6.0 12/01/2015   HGBA1C 7.3 (H)  06/24/2015   Lab Results  Component Value Date   LDLCALC 141 (H) 10/25/2015   CREATININE 1.52 (H) 03/02/2016     Allergies as of 03/02/2016      Reactions   Darvon Other (See Comments)   confusion   Digoxin And Related Nausea Only   Penicillins Other (See Comments)   Was told had allergy from childhood... Unknown reaction   Percocet [oxycodone-acetaminophen] Other (See Comments)   confusion   Percodan [oxycodone-aspirin] Other (See Comments)   confusion   Vicodin [hydrocodone-acetaminophen] Other (See Comments)   confusion      Medication List       Accurate as of 03/02/16 11:59 PM. Always use your most recent med list.          ACCU-CHEK AVIVA PLUS w/Device Kit Use to check sugars 2 times daily.   ACCU-CHEK FASTCLIX LANCETS Misc Use to check sugar 2 times daily   acetaminophen 500 MG tablet Commonly known as:  TYLENOL Take 500 mg by mouth every 6 (six) hours as needed (pain).   atropine 1 % ophthalmic solution Place 1 drop into both eyes daily as needed (burning eyes).   cholecalciferol 1000 units tablet Commonly known as:  VITAMIN D Take 1,000 Units by mouth daily. Reported on 07/30/2015   ciprofloxacin 250 MG tablet Commonly known as:  CIPRO Take 250 mg by mouth 2 (two) times daily.   diclofenac sodium 1 % Gel Commonly known  as:  VOLTAREN Apply 4 g topically 3 (three) times daily. To right knee   donepezil 5 MG tablet Commonly known as:  ARICEPT Take 5 mg by mouth at bedtime.   folic acid 1 MG tablet Commonly known as:  FOLVITE take 1 tablet by mouth once daily   furosemide 40 MG tablet Commonly known as:  LASIX Take 1 tablet (40 mg total) by mouth daily.   gabapentin 100 MG capsule Commonly known as:  NEURONTIN Take 100 mg by mouth 2 (two) times daily.   glipiZIDE 5 MG tablet Commonly known as:  GLUCOTROL Take 1/2 pill daily   glucose blood test strip Commonly known as:  ACCU-CHEK AVIVA PLUS Use as instructed to check sugar 2 times  daily.   JANUVIA 50 MG tablet Generic drug:  sitaGLIPtin take 1 tablet by mouth once daily   levothyroxine 125 MCG tablet Commonly known as:  SYNTHROID Take 0.5 tablets (62.5 mcg total) by mouth daily.   loratadine 10 MG tablet Commonly known as:  CLARITIN Take 10 mg by mouth daily.   lubiprostone 8 MCG capsule Commonly known as:  AMITIZA Take 1 capsule (8 mcg total) by mouth 2 (two) times daily with a meal.   meclizine 25 MG tablet Commonly known as:  ANTIVERT Take 25 mg by mouth 3 (three) times daily as needed for dizziness.   MUCUS RELIEF CHEST CONGESTION 400 MG Tabs tablet Generic drug:  guaifenesin Take 400 mg by mouth 2 (two) times daily as needed (cough/ congestion).   nitroGLYCERIN 0.1 mg/hr patch Commonly known as:  NITRODUR - Dosed in mg/24 hr Place 1 patch (0.1 mg total) onto the skin daily.   ofloxacin 0.3 % ophthalmic solution Commonly known as:  OCUFLOX Place 1 drop into both eyes as needed.   omeprazole 20 MG capsule Commonly known as:  PRILOSEC Take 1 capsule (20 mg total) by mouth 2 (two) times daily before a meal.   OVER THE COUNTER MEDICATION Take 1 Can by mouth daily. Enterex nutritional supplement - with choline   OVER THE COUNTER MEDICATION Take 1 tablet by mouth 2 (two) times daily with a meal. FD Guard   OVER THE COUNTER MEDICATION Take 1 tablet by mouth daily. Micro-algae DHA   OVER THE COUNTER MEDICATION Take 1 capsule by mouth daily. Choline 100 mg, trimethylglycine 50 mg, glutathione 50 mg, methyltetrahydrofolate, l-arginine-, l-ornithine, l-citruline   Potassium Chloride CR 8 MEQ Cpcr capsule CR Commonly known as:  MICRO-K Take 1 capsule (8 mEq total) by mouth daily.   PROAIR HFA 108 (90 Base) MCG/ACT inhaler Generic drug:  albuterol Inhale 2 puffs into the lungs every 6 (six) hours as needed for wheezing or shortness of breath.   ranolazine 500 MG 12 hr tablet Commonly known as:  [name] Take 1 tablet (500 mg total) by mouth 2  (two) times daily.   REFRESH OP Place 1 drop into both eyes daily as needed (dry eyes).   rosuvastatin 10 MG tablet Commonly known as:  CRESTOR Take 1 tablet (10 mg total) by mouth daily.   senna-docusate 8.6-50 MG tablet Commonly known as:  SENOKOT S Take 1 tablet by mouth 2 (two) times daily. For constipation   traMADol 50 MG tablet Commonly known as:  ULTRAM Take 1 tablet (50 mg total) by mouth every 6 (six) hours as needed for moderate pain.   warfarin 3 MG tablet Commonly known as:  COUMADIN TAKE 1 TO 1 AND 1/2 TABLETS DAILY OR AS DIRECTED BY COUMADIN  CLINIC   zolpidem 5 MG tablet Commonly known as:  AMBIEN Take 0.5 tablets (2.5 mg total) by mouth at bedtime. sleep       Past Medical History:  Diagnosis Date  . 2nd degree atrioventricular block    a. 06/22/15 Gen change: MDT ADDR01 Adapta, DC PPM (ser# [id number]).  . Allergy to perfume   . Anemia   . Arthritis   . Chronic combined systolic and diastolic CHF (congestive heart failure) ([place])    a. 01/2014 Echo: EF @ least mod-sev reduced with HK of lat/apical, basal inf walls. basalpost AK, Gr 1DD; b. 06/2015 Echo: EF 45-50%, Gr2 DD, mod LVH.   . CKD (chronic kidney disease), stage III    a. iii - iv.  . Coronary artery disease    a. 1994 s/p cabg;  b. 09/2013 MV: large, sev intensity, partially reversible inf, apical defect, prior inf/ap infarct w/ mild peri-infarct ischemia->Med Rx; c. 06/2015 NSTEMI (trop 6)->Med rx.  . Daily headache   . DVT of upper extremity (deep vein thrombosis) ([place]) 06/13/2012   BUE  . Dyspepsia   . Gastric ulcer   . GERD (gastroesophageal reflux disease)   . H/O: GI bleed 12//13  . Hiatal hernia   . High cholesterol   . History of blood transfusion 2013  . Hypertensive heart disease   . Hypothyroid   . Legally blind   . PAF (paroxysmal atrial fibrillation) ([facility])    a. CHA2DS2VASc = 7-->coumadin.  . Presence of permanent cardiac pacemaker    a. 06/22/15 Gen change: MDT ADDR01 Adapta, DC  PPM (ser# [id number]).  . Seasonal allergies   . Type II diabetes mellitus ([place])   . UTI (lower urinary tract infection)     Past Surgical History:  Procedure Laterality Date  . CARDIAC CATHETERIZATION  10/25/92  . CARDIAC CATHETERIZATION  11/18/03   w/grafts 100%CX LAD 80 & 100%  . CARDIAC CATHETERIZATION  01/24/05   diffuse disease of native vessels  . CARDIAC CATHETERIZATION  06/06/06   severe native CAD  . CARDIOVERSION  06/06/06   successful  . CATARACT EXTRACTION W/ INTRAOCULAR LENS  IMPLANT, BILATERAL Bilateral   . CORONARY ARTERY BYPASS GRAFT  10/27/92   LIMA to LAD,SVG to LAD second diagonal,obtuse maraginal of the CX and posterior descendingbranch of the RCA  . EP IMPLANTABLE DEVICE N/A 06/22/2015   Procedure: PPM/BIV PPM Generator Changeout;  Surgeon: Sanda Klein, MD;  Location: [facility];  Service: Cardiovascular;  Laterality: N/A;  . ESOPHAGOGASTRODUODENOSCOPY  12/22/2011   Procedure: ESOPHAGOGASTRODUODENOSCOPY (EGD);  Surgeon: Gatha Mayer, MD;  Location: [facility];  Service: Endoscopy;  Laterality: N/A;  . INSERT / REPLACE / REMOVE PACEMAKER  06/29/2006   Medtronic adapta  . REFRACTIVE SURGERY Bilateral     Family History  Problem Relation Age of Onset  . Heart disease Father   . Diabetes Father   . Breast cancer Daughter   . Diabetes Son   . Diabetes Daughter   . Colon polyps Daughter   . Heart attack Brother   . Diabetes Brother   . Stroke Sister   . Colon cancer Neg Hx     Social History:  reports that she has never smoked. She has never used smokeless tobacco. She reports that she does not drink alcohol or use drugs.  Allergies:  Allergies  Allergen Reactions  . Darvon Other (See Comments)    confusion  . Digoxin And Related Nausea Only  . Penicillins Other (See Comments)  Was told had allergy from childhood... Unknown reaction  . Percocet [Oxycodone-Acetaminophen] Other (See Comments)    confusion  . Percodan [Oxycodone-Aspirin]  Other (See Comments)    confusion  . Vicodin [Hydrocodone-Acetaminophen] Other (See Comments)    confusion    REVIEW of systems:  Hypothyroidism: She is taking levothyroxine  62.5 mcg daily  Previously TSH was high when she was taking 50 g    Lab Results  Component Value Date   TSH 1.68 03/02/2016   TSH 2.02 12/01/2015   TSH 7.99 (H) 07/07/2015   FREET4 0.70 06/25/2014   FREET4 0.65 06/24/2013   FREET4 0.93 01/24/2013     She has had renal dysfunction which is Has been variable   Lab Results  Component Value Date   CREATININE 1.52 (H) 03/02/2016    She has significant neuropathy with sensory loss on previous  foot exam.   She has had hyperlipidemia number not clear this has been followed recently by PCP.  She is taking 10 mg Crestor  Lab Results  Component Value Date   CHOL 222 (H) 12/01/2015   HDL 32.20 (L) 12/01/2015   LDLCALC 141 (H) 10/25/2015   LDLDIRECT 132.0 03/02/2016   TRIG 244.0 (H) 12/01/2015   CHOLHDL 7 12/01/2015      Examination:   BP 108/60   Pulse 71   Ht '5\' 4"'$  (1.626 m)   SpO2 96%   There is no height or weight on file to calculate BMI.    Assesment/plan:   1.  Diabetes type 2, with obesity, long-standing   See history of present illness for  discussion of current diabetes management, blood sugar patterns and problems identified   With taking only 2.5 mg glipizide at suppertime she appears to have fairly good blood sugars by history A1c is also improved at 6.3 % Also no reported hypoglycemia with low-dose blood sugar 80 Has not checked readings after meals  Again discussed with her family that she can have a liberal diet considering her age   50.  Hypothyroidism: Will check labs    3.  Hyperlipidemia: Needs follow-up labs, currently on Crestor  Tavonte Seybold 03/03/2016, 9:16 PM   Addendum: LDL is 132, have faxed information to PCP, recommend increasing Crestor to 20 mg TSH okay     .

## 2016-03-02 NOTE — Patient Instructions (Addendum)
Glipizide 1/2 just before supper; may skip if eating less than usual

## 2016-03-03 NOTE — Progress Notes (Signed)
Please let her daughter know that cholesterol is high, is she taking Crestor consistently?  If she is then she will need to increase the dose from 10 mg to 20 mg

## 2016-03-06 ENCOUNTER — Other Ambulatory Visit: Payer: Self-pay

## 2016-03-06 ENCOUNTER — Ambulatory Visit: Payer: Medicare HMO | Admitting: Physician Assistant

## 2016-03-06 LAB — POCT INR: INR: 2.1

## 2016-03-06 MED ORDER — ROSUVASTATIN CALCIUM 20 MG PO TABS: 20.0000 mg | ORAL_TABLET | Freq: Every day | ORAL | 3 refills | Status: DC

## 2016-03-07 ENCOUNTER — Ambulatory Visit (INDEPENDENT_AMBULATORY_CARE_PROVIDER_SITE_OTHER): Payer: Medicare HMO | Admitting: Pharmacist Clinician (PhC)/ Clinical Pharmacy Specialist

## 2016-03-07 DIAGNOSIS — Z7901 Long term (current) use of anticoagulants: Secondary | ICD-10-CM

## 2016-03-09 ENCOUNTER — Encounter: Payer: Self-pay | Admitting: Physician Assistant

## 2016-03-09 ENCOUNTER — Ambulatory Visit: Payer: Medicare HMO | Admitting: Physician Assistant

## 2016-03-09 ENCOUNTER — Ambulatory Visit (INDEPENDENT_AMBULATORY_CARE_PROVIDER_SITE_OTHER): Payer: Medicare HMO | Admitting: Physician Assistant

## 2016-03-09 VITALS — BP 100/58 | HR 68

## 2016-03-09 DIAGNOSIS — R1013 Epigastric pain: Secondary | ICD-10-CM

## 2016-03-09 DIAGNOSIS — K219 Gastro-esophageal reflux disease without esophagitis: Secondary | ICD-10-CM

## 2016-03-09 MED ORDER — OMEPRAZOLE 20 MG PO CPDR: 40.0000 mg | DELAYED_RELEASE_CAPSULE | Freq: Two times a day (BID) | ORAL | 11 refills | Status: DC

## 2016-03-09 MED ORDER — OMEPRAZOLE 40 MG PO CPDR: 40.0000 mg | DELAYED_RELEASE_CAPSULE | Freq: Two times a day (BID) | ORAL | 3 refills | Status: DC

## 2016-03-09 NOTE — Progress Notes (Signed)
Chief Complaint: : "Abdominal burning"  HPI:  [name] is a 83 year old African American female with a past medical history of second-degree AV block, arthritis, chronic combined systolic and diastolic CHF, last echo 03/1515 with an EF of 45-50%, CKD stage III, CAD, status post pacemaker and others listed below, who was referred to me by [name], MD for a complaint of "abdominal burning" .     Patient has previously been seen by Dr. Carlean Purl with an EGD 12/22/11 with a finding of a small ulcer measuring 3 x 4 mm in size in the gastric body, 2 cm hiatal hernia and otherwise normal EGD. She is now being followed by Dr. Silverio Decamp and was last seen in office on 09/09/15. At that time she was given FD Guard amongst other things to help with her various symptoms.   Today, the patient returns to clinic accompanied by her daughter who is her sole caretaker as well as her son. The patient is legally blind and her daughter takes care of her medications. Her family does assist with her history. The patient tells me that she has almost constant epigastric abdominal burning pain which seems to be worse after eating "some things that just don't digest". It should be noted the patient and her family are all poor historians and it is hard to get the patient to focus on why she is in our clinic specifically. She does discuss pain when she urinates multiple times throughout her conversation. She seems to confuse the two "burnings". Patient is on Omeprazole 20 mg twice a day. The daughter tells me she occasionally takes this after meals instead of before. They do explain she was on FD guard and this seemed to help some but they "ran out of samples". Patient tells me this pain is a 5/10 at its worst and she feels it "almost all the time" she also describes occasional reflux. Accompanying this is some difficulty with swallowing her pills. Things seem to be okay if she drinks enough water.   Patient denies fever, chills,  blood in her stool, melena, weight loss, fatigue, anorexia, nausea, vomiting or symptoms that awaken her at night.  Past Medical History:  Diagnosis Date  . 2nd degree atrioventricular block    a. 06/22/15 Gen change: MDT ADDR01 Adapta, DC PPM (ser# [id number]).  . Allergy to perfume   . Anemia   . Arthritis   . Chronic combined systolic and diastolic CHF (congestive heart failure) ([place])    a. 01/2014 Echo: EF @ least mod-sev reduced with HK of lat/apical, basal inf walls. basalpost AK, Gr 1DD; b. 06/2015 Echo: EF 45-50%, Gr2 DD, mod LVH.   . CKD (chronic kidney disease), stage III    a. iii - iv.  . Coronary artery disease    a. 1994 s/p cabg;  b. 09/2013 MV: large, sev intensity, partially reversible inf, apical defect, prior inf/ap infarct w/ mild peri-infarct ischemia->Med Rx; c. 06/2015 NSTEMI (trop 6)->Med rx.  . Daily headache   . DVT of upper extremity (deep vein thrombosis) ([place]) 06/13/2012   BUE  . Dyspepsia   . Gastric ulcer   . GERD (gastroesophageal reflux disease)   . H/O: GI bleed 12//13  . Hiatal hernia   . High cholesterol   . History of blood transfusion 2013  . Hypertensive heart disease   . Hypothyroid   . Legally blind   . PAF (paroxysmal atrial fibrillation) ([facility])    a. CHA2DS2VASc = 7-->coumadin.  . Presence  of permanent cardiac pacemaker    a. 06/22/15 Gen change: MDT ADDR01 Adapta, DC PPM (ser# [id number]).  . Seasonal allergies   . Type II diabetes mellitus ([place])   . UTI (lower urinary tract infection)     Past Surgical History:  Procedure Laterality Date  . CARDIAC CATHETERIZATION  10/25/92  . CARDIAC CATHETERIZATION  11/18/03   w/grafts 100%CX LAD 80 & 100%  . CARDIAC CATHETERIZATION  01/24/05   diffuse disease of native vessels  . CARDIAC CATHETERIZATION  06/06/06   severe native CAD  . CARDIOVERSION  06/06/06   successful  . CATARACT EXTRACTION W/ INTRAOCULAR LENS  IMPLANT, BILATERAL Bilateral   . CORONARY ARTERY BYPASS GRAFT  10/27/92   LIMA to  LAD,SVG to LAD second diagonal,obtuse maraginal of the CX and posterior descendingbranch of the RCA  . EP IMPLANTABLE DEVICE N/A 06/22/2015   Procedure: PPM/BIV PPM Generator Changeout;  Surgeon: Sanda Klein, MD;  Location: [facility];  Service: Cardiovascular;  Laterality: N/A;  . ESOPHAGOGASTRODUODENOSCOPY  12/22/2011   Procedure: ESOPHAGOGASTRODUODENOSCOPY (EGD);  Surgeon: Gatha Mayer, MD;  Location: [facility];  Service: Endoscopy;  Laterality: N/A;  . INSERT / REPLACE / REMOVE PACEMAKER  06/29/2006   Medtronic adapta  . REFRACTIVE SURGERY Bilateral     Current Outpatient Prescriptions  Medication Sig Dispense Refill  . ACCU-CHEK FASTCLIX LANCETS MISC Use to check sugar 2 times daily 200 each 11  . acetaminophen (TYLENOL) 500 MG tablet Take 500 mg by mouth every 6 (six) hours as needed (pain).    Marland Kitchen albuterol (PROAIR HFA) 108 (90 Base) MCG/ACT inhaler Inhale 2 puffs into the lungs every 6 (six) hours as needed for wheezing or shortness of breath.    Marland Kitchen atropine 1 % ophthalmic solution Place 1 drop into both eyes daily as needed (burning eyes).   0  . Blood Glucose Monitoring Suppl (ACCU-CHEK AVIVA PLUS) w/Device KIT Use to check sugars 2 times daily. 1 kit 0  . cholecalciferol (VITAMIN D) 1000 units tablet Take 1,000 Units by mouth daily. Reported on 07/30/2015    . ciprofloxacin (CIPRO) 250 MG tablet Take 250 mg by mouth 2 (two) times daily.  0  . diclofenac sodium (VOLTAREN) 1 % GEL Apply 4 g topically 3 (three) times daily. To right knee 1 Tube 3  . donepezil (ARICEPT) 5 MG tablet Take 5 mg by mouth at bedtime.   0  . folic acid (FOLVITE) 1 MG tablet take 1 tablet by mouth once daily 30 tablet 9  . furosemide (LASIX) 40 MG tablet Take 1 tablet (40 mg total) by mouth daily. (Patient taking differently: Take 40 mg by mouth daily. Patient taking 1/2 pill for 4 days a week and a whole pill 3 days a week) 30 tablet 11  . gabapentin (NEURONTIN) 100 MG capsule Take 100 mg by mouth 2  (two) times daily.     Marland Kitchen glipiZIDE (GLUCOTROL) 5 MG tablet Take 1/2 pill daily 45 tablet 1  . glucose blood (ACCU-CHEK AVIVA PLUS) test strip Use as instructed to check sugar 2 times daily. 200 each 11  . guaifenesin (MUCUS RELIEF CHEST CONGESTION) 400 MG TABS tablet Take 400 mg by mouth 2 (two) times daily as needed (cough/ congestion).    Marland Kitchen JANUVIA 50 MG tablet take 1 tablet by mouth once daily 30 tablet 1  . levothyroxine (SYNTHROID) 125 MCG tablet Take 0.5 tablets (62.5 mcg total) by mouth daily. 45 tablet 1  . loratadine (CLARITIN) 10 MG tablet  Take 10 mg by mouth daily.    Marland Kitchen lubiprostone (AMITIZA) 8 MCG capsule Take 1 capsule (8 mcg total) by mouth 2 (two) times daily with a meal. 60 capsule 3  . meclizine (ANTIVERT) 25 MG tablet Take 25 mg by mouth 3 (three) times daily as needed for dizziness.    . nitroGLYCERIN (NITRODUR - DOSED IN MG/24 HR) 0.1 mg/hr patch Place 1 patch (0.1 mg total) onto the skin daily. 30 patch 11  . ofloxacin (OCUFLOX) 0.3 % ophthalmic solution Place 1 drop into both eyes as needed.  0  . omeprazole (PRILOSEC) 20 MG capsule Take 1 capsule (20 mg total) by mouth 2 (two) times daily before a meal. 60 capsule 11  . OVER THE COUNTER MEDICATION Take 1 Can by mouth daily. Enterex nutritional supplement - with choline    . OVER THE COUNTER MEDICATION Take 1 tablet by mouth 2 (two) times daily with a meal. FD Guard    . OVER THE COUNTER MEDICATION Take 1 tablet by mouth daily. Micro-algae DHA    . OVER THE COUNTER MEDICATION Take 1 capsule by mouth daily. Choline 100 mg, trimethylglycine 50 mg, glutathione 50 mg, methyltetrahydrofolate, l-arginine-, l-ornithine, l-citruline    . Polyvinyl Alcohol-Povidone (REFRESH OP) Place 1 drop into both eyes daily as needed (dry eyes).     . Potassium Chloride CR (MICRO-K) 8 MEQ CPCR capsule CR Take 1 capsule (8 mEq total) by mouth daily. 30 capsule 6  . ranolazine ([name]) 500 MG 12 hr tablet Take 1 tablet (500 mg total) by mouth 2 (two)  times daily. 180 tablet 3  . rosuvastatin (CRESTOR) 20 MG tablet Take 1 tablet (20 mg total) by mouth daily. 90 tablet 3  . senna-docusate (SENOKOT S) 8.6-50 MG tablet Take 1 tablet by mouth 2 (two) times daily. For constipation 60 tablet 1  . traMADol (ULTRAM) 50 MG tablet Take 1 tablet (50 mg total) by mouth every 6 (six) hours as needed for moderate pain. (Patient taking differently: Take 50 mg by mouth 2 (two) times daily as needed for moderate pain. ) 30 tablet 0  . warfarin (COUMADIN) 3 MG tablet TAKE 1 TO 1 AND 1/2 TABLETS DAILY OR AS DIRECTED BY COUMADIN CLINIC 40 tablet 3  . zolpidem (AMBIEN) 5 MG tablet Take 0.5 tablets (2.5 mg total) by mouth at bedtime. sleep (Patient taking differently: Take 5 mg by mouth. sleep) 30 tablet 0   No current facility-administered medications for this visit.     Allergies as of 03/09/2016 - Review Complete 03/09/2016  Allergen Reaction Noted  . Darvon Other (See Comments) 03/14/2011  . Digoxin and related Nausea Only 04/22/2012  . Penicillins Other (See Comments) 12/17/2011  . Percocet [oxycodone-acetaminophen] Other (See Comments) 03/14/2011  . Percodan [oxycodone-aspirin] Other (See Comments) 09/12/2011  . Vicodin [hydrocodone-acetaminophen] Other (See Comments) 12/18/2011    Family History  Problem Relation Age of Onset  . Heart disease Father   . Diabetes Father   . Breast cancer Daughter   . Diabetes Son   . Diabetes Daughter   . Colon polyps Daughter   . Heart attack Brother   . Diabetes Brother   . Stroke Sister   . Colon cancer Neg Hx     Social History   Social History  . Marital status: Widowed    Spouse name: N/A  . Number of children: 10  . Years of education: N/A   Occupational History  .  Retired   Social History Main Topics  .  Smoking status: Never Smoker  . Smokeless tobacco: Never Used  . Alcohol use No  . Drug use: No  . Sexual activity: No   Other Topics Concern  . Not on file   Social History  Narrative  . No narrative on file    Review of Systems:    Constitutional: No weight loss, fever, chills, weakness or fatigue Cardiovascular: No chest pain Respiratory: No SOB Gastrointestinal: See HPI and otherwise negative   Physical Exam:  Vital signs: BP (!) 100/58   Pulse 68    Constitutional:   Pleasant elderly African-American female appears to be in NAD, Well developed, Well nourished, alert and cooperative, in wheelchair Respiratory: Respirations even and unlabored. Lungs clear to auscultation bilaterally.   No wheezes, crackles, or rhonchi.  Cardiovascular: Normal S1, S2. No MRG. Regular rate and rhythm. No peripheral edema, cyanosis or pallor.  Gastrointestinal:  Soft, nondistended, mild epigastric TTP No rebound or guarding. Normal bowel sounds. No appreciable masses or hepatomegaly. Psychiatric: . Demonstrates good judgement and reason without abnormal affect or behaviors. Memory impairment  MOST RECENT LABS: CBC    Component Value Date/Time   WBC 6.1 07/01/2015 0634   RBC 3.46 (L) 07/01/2015 0634   HGB 9.7 (L) 07/01/2015 0634   HCT 30.8 (L) 07/01/2015 0634   PLT 140 (L) 07/01/2015 0634   MCV 89.0 07/01/2015 0634   MCH 28.0 07/01/2015 0634   MCHC 31.5 07/01/2015 0634   RDW 14.5 07/01/2015 0634   LYMPHSABS 1.5 06/24/2015 1600   MONOABS 0.5 06/24/2015 1600   EOSABS 0.1 06/24/2015 1600   BASOSABS 0.0 06/24/2015 1600    CMP     Component Value Date/Time   NA 140 03/02/2016 1554   K 4.3 03/02/2016 1554   CL 104 03/02/2016 1554   CO2 33 (H) 03/02/2016 1554   GLUCOSE 124 (H) 03/02/2016 1554   BUN 23 03/02/2016 1554   CREATININE 1.52 (H) 03/02/2016 1554   CREATININE 1.16 (H) 10/25/2015 1118   CALCIUM 8.9 03/02/2016 1554   PROT 6.4 03/02/2016 1554   ALBUMIN 3.4 (L) 03/02/2016 1554   AST 15 03/02/2016 1554   ALT 8 03/02/2016 1554   ALKPHOS 91 03/02/2016 1554   BILITOT 0.6 03/02/2016 1554   GFRNONAA 25 (L) 07/01/2015 0634   GFRAA 30 (L) 07/01/2015 2563     Assessment: 1. Epigastric abdominal pain: "Burning", almost constant, seems to be related to food, associated increase in reflux; likely this represents gastritis 2. GERD: See above  Plan: 1. Increased patient's Omeprazole 40 mg twice a day, 30-60 minutes before eating. #60 with 5 refills 2. Provide the patient with coupons for FD guard 3. Reviewed antireflux diet and lifestyle modifications with the family and the patient. 4. Patient to follow in clinic in 3-4 weeks with Dr. Silverio Decamp if symptoms persist.  Ellouise Newer, PA-C [facility] 03/09/2016, 11:37 AM  Cc: [name], MD

## 2016-03-09 NOTE — Patient Instructions (Signed)
Increase Omeprazole 40 mg twice a day.   Please purchase the following medications over the counter and take as directed: FD guard 2 tablets twice a day

## 2016-03-13 NOTE — Progress Notes (Signed)
Reviewed and agree with documentation and assessment and plan. K. Veena Zerick Prevette , MD   

## 2016-03-15 ENCOUNTER — Telehealth: Payer: Self-pay | Admitting: Cardiovascular Disease

## 2016-03-15 NOTE — Telephone Encounter (Signed)
Daughter called and said Dr C told her to call if pt's weight exceeded 212.This morning pt's weight is at 213.Please call to advise.

## 2016-03-15 NOTE — Telephone Encounter (Signed)
lmtcb

## 2016-03-16 NOTE — Telephone Encounter (Signed)
Spoke with Daughter Khassidy Taffe she states that after taking lasix 40mg  yesterday her weight is still at 213 she states that she is up from 209. She will have pt take lasix 40mg  today and call back tomorrow with result and further direction.

## 2016-03-16 NOTE — Telephone Encounter (Signed)
Daughter Rashonda Barroga notified she will weigh pt and if there is no/little change in pt's weight she will give pt 80mg  lasix in the morning and then call in the afternoon with result.

## 2016-03-16 NOTE — Telephone Encounter (Signed)
OK to give 80 mg furosemide tomorrow if no change in weight by tomorrow.

## 2016-03-16 NOTE — Telephone Encounter (Signed)
Lm2cb 

## 2016-03-16 NOTE — Telephone Encounter (Signed)
Follow Up   Pt daughter returning phone call. Requesting call back

## 2016-03-17 ENCOUNTER — Telehealth: Payer: Self-pay | Admitting: Cardiovascular Disease

## 2016-03-17 NOTE — Telephone Encounter (Signed)
Follow up     Returning [name]'s call her mothers weight is 211lb, it went down from 213 to 211.

## 2016-03-17 NOTE — Telephone Encounter (Signed)
Spoke with pt dtr, she is doing fine today, her weight is down 2 lbs. She gave her 1/2 tablet today. She will continue to monitor her weights and if she is up 3 lbs over night or 5 lbs in a week she can give her a whole tablet. They will call back if she has any more problems.

## 2016-03-20 ENCOUNTER — Ambulatory Visit (INDEPENDENT_AMBULATORY_CARE_PROVIDER_SITE_OTHER): Payer: Medicare HMO | Admitting: Pharmacist

## 2016-03-20 ENCOUNTER — Telehealth: Payer: Self-pay | Admitting: Endocrinology

## 2016-03-20 ENCOUNTER — Telehealth: Payer: Self-pay | Admitting: Cardiovascular Disease

## 2016-03-20 DIAGNOSIS — Z7901 Long term (current) use of anticoagulants: Secondary | ICD-10-CM

## 2016-03-20 LAB — PROTIME-INR: INR: 2 — AB (ref <=1.1)

## 2016-03-20 NOTE — Telephone Encounter (Signed)
Lm2cb 

## 2016-03-20 NOTE — Telephone Encounter (Signed)
New message      Daughter is calling to report pt's weight.  209lbs today---no fluid pill was given last night.  Last sat 03-18-16, wt was 207.  Should pt restart fluid pill?

## 2016-03-20 NOTE — Telephone Encounter (Signed)
No need to do anything, if she forgets her glipizide before supper she should not take it later in the evening

## 2016-03-20 NOTE — Telephone Encounter (Signed)
Pt daughter is aware of the note from Dr. Dwyane Dee.

## 2016-03-20 NOTE — Telephone Encounter (Signed)
Pt's daughter forgot to give her the glipizide before she ate and gave it to her about 2 hours after dinner yesterday  The BS was: 177 after dinner yesterday This am the blood sugar was 75 before her breakfast, now she has eaten should she do anything   Could that be because of the late glipizide

## 2016-03-21 ENCOUNTER — Other Ambulatory Visit: Payer: Self-pay | Admitting: Cardiovascular Disease

## 2016-03-21 ENCOUNTER — Telehealth: Payer: Self-pay | Admitting: Cardiovascular Disease

## 2016-03-21 MED ORDER — WARFARIN SODIUM 3 MG PO TABS: ORAL_TABLET | ORAL | 3 refills | Status: DC

## 2016-03-21 NOTE — Telephone Encounter (Signed)
Spoke with Luke-ok to wait for Dr C-continue current dosing until review by Dr Loletha Grayer

## 2016-03-21 NOTE — Telephone Encounter (Signed)
Left detailed message(DPR) to take 80mg  lasix today and weigh tomorrow and call back if no change

## 2016-03-21 NOTE — Telephone Encounter (Signed)
Spoke with Daughter Addalyn Gladman she states that pt is at 84 in the wheelchair(35#) she states that the last appt she forgot to tell Dr C that her 212# weight at her last appt was with the wheelchair. She will continue with dosage discussed at that appt until she hears back fron Dr C. (per last visit 02-29-16:  Patient Instructions:  Dr Sallyanne Kuster has recommended making the following medication changes:  CHANGE the way you take Furosemide:  -Take Furosemide 40 mg (1 tablet) by mouth on Mondays, Wednesdays, and Fridays,-Take Furosemide 20 mg (0.5 tablet) by mouth on Tuesdays, Thursdays, Saturdays, and Sundays.   Weigh daily. Call the office if your weight is over 212 pounds by your home scale.)

## 2016-03-21 NOTE — Telephone Encounter (Signed)
New Message     Do you want her to double the 20mg  or the 40 mg, pt has both of this medication  furosemide (LASIX) 40 MG tablet Take 1 tablet (40 mg total) by mouth daily. Patient taking differently: Take 40 mg by mouth daily. Patient taking 1/2 pill for 4 days a week and a whole pill 3 days a week   Cell [phone] if you cant reach on home

## 2016-03-21 NOTE — Telephone Encounter (Signed)
Follow up    Pt daughter is calling back about her mothers weight. Today she is 208lbs, and she did not have a fluid pill yesterday.

## 2016-03-21 NOTE — Telephone Encounter (Signed)
New message   Pt daughter is calling because her mother is out of warfarin. Her daughter states she did not take any last night.   *STAT* If patient is at the pharmacy, call can be transferred to refill team.   1. Which medications need to be refilled? (please list name of each medication and dose if known) warfarin 3 mg  2. Which pharmacy/location (including street and city if local pharmacy) is medication to be sent to? [facility] on [address].  3. Do they need a 30 day or 90 day supply? 30 Day

## 2016-03-22 ENCOUNTER — Ambulatory Visit (INDEPENDENT_AMBULATORY_CARE_PROVIDER_SITE_OTHER): Payer: Medicare HMO | Admitting: Pharmacist

## 2016-03-22 DIAGNOSIS — Z7901 Long term (current) use of anticoagulants: Secondary | ICD-10-CM

## 2016-03-22 LAB — PROTIME-INR: INR: 1.6 — AB (ref <=1.1)

## 2016-03-23 ENCOUNTER — Ambulatory Visit (INDEPENDENT_AMBULATORY_CARE_PROVIDER_SITE_OTHER): Payer: Medicare HMO | Admitting: Endocrinology

## 2016-03-23 ENCOUNTER — Encounter: Payer: Self-pay | Admitting: Endocrinology

## 2016-03-23 VITALS — BP 100/54 | HR 66 | Ht 64.0 in | Wt 175.0 lb

## 2016-03-23 DIAGNOSIS — E1165 Type 2 diabetes mellitus with hyperglycemia: Secondary | ICD-10-CM

## 2016-03-23 NOTE — Patient Instructions (Signed)
May give Glipizide right after the meal; if eating less than 1/2 meal then skip Rx  Rotate times of checking sugar

## 2016-03-23 NOTE — Telephone Encounter (Signed)
Continue current dosing until Dr. Loletha Grayer returns.  Dr. Lemmie Evens

## 2016-03-23 NOTE — Progress Notes (Signed)
Patient ID: [name], female   DOB: Dec 10, 1920, 83 y.o.   MRN: [id number]   Reason for Appointment: Diabetes follow-up   History of Present Illness   Diagnosis: Type 2 diabetes mellitus, date of diagnosis: 1972.   She had been on insulin for several years and this had been tapered off in 2013 because of weight loss and lower blood sugars. Subsequently blood sugars had been generally reasonably good.  Because of her age and multiple medical problems she has been monitored without insulin  Recent history:  Her last A1c was 6.3 in February  Because of high cost she cannot afford  Januvia and she was started back on glipizide in 12/17 Previously had done well with Januvia which was started because of significantly high readings  Current management, blood sugar patterns and problems:  She did bring her monitor for download but is still using a Generic monitor, she had not brought her meter on her last visit  Although she is not keeping a record of her blood sugars are daughter is checking her blood sugars only 2-3 hours after supper now  Her blood sugars are fairly good with only a couple of readings in the low normal range at night  No reported hypoglycemia  He will have an occasional reading that is over 200 based on what she is eating  Her daughter think that she is usually eating most of her meal in the evening, last night had chicken pot pie with green beans  She also usually has a good breakfast but does not check readings after eating  Also occasionally will have small amount of regular soft drink such as lemonade.  Also sometimes will have Glucerna  Her weight has been relatively stable     Recent blood sugar readings by : Range 72-222 average 145 for 14 days  Meals: 2 meals at about noon and 7 pm; sometimes has lemonade or sprite. Has small portions, has some grapes for snacks in the afternoon  Dietician visit: Most recent:2001.     Wt  Readings from Last 3 Encounters:  03/23/16 175 lb (79.4 kg)  02/29/16 172 lb (78 kg)  01/20/16 175 lb 6.4 oz (79.6 kg)   Lab Results  Component Value Date   HGBA1C 6.3 03/02/2016   HGBA1C 6.0 12/01/2015   HGBA1C 7.3 (H) 06/24/2015   Lab Results  Component Value Date   LDLCALC 141 (H) 10/25/2015   CREATININE 1.52 (H) 03/02/2016   Lab Results  Component Value Date   FRUCTOSAMINE 371 (H) 07/07/2015   FRUCTOSAMINE 274 (H) 03/24/2013     Allergies as of 03/23/2016      Reactions   Darvon Other (See Comments)   confusion   Digoxin And Related Nausea Only   Penicillins Other (See Comments)   Was told had allergy from childhood... Unknown reaction   Percocet [oxycodone-acetaminophen] Other (See Comments)   confusion   Percodan [oxycodone-aspirin] Other (See Comments)   confusion   Vicodin [hydrocodone-acetaminophen] Other (See Comments)   confusion      Medication List       Accurate as of 03/23/16  2:06 PM. Always use your most recent med list.          ACCU-CHEK AVIVA PLUS w/Device Kit Use to check sugars 2 times daily.   ACCU-CHEK FASTCLIX LANCETS Misc Use to check sugar 2 times daily   acetaminophen 500 MG tablet Commonly known as:  TYLENOL Take 500 mg by mouth every 6 (six)  hours as needed (pain).   atropine 1 % ophthalmic solution Place 1 drop into both eyes daily as needed (burning eyes).   cholecalciferol 1000 units tablet Commonly known as:  VITAMIN D Take 1,000 Units by mouth daily. Reported on 07/30/2015   ciprofloxacin 250 MG tablet Commonly known as:  CIPRO Take 250 mg by mouth 2 (two) times daily.   diclofenac sodium 1 % Gel Commonly known as:  VOLTAREN Apply 4 g topically 3 (three) times daily. To right knee   donepezil 5 MG tablet Commonly known as:  ARICEPT Take 5 mg by mouth at bedtime.   folic acid 1 MG tablet Commonly known as:  FOLVITE Take 1 tablet (1 mg total) by mouth daily.   furosemide 40 MG tablet Commonly known as:   LASIX Take 1 tablet (40 mg total) by mouth daily.   gabapentin 100 MG capsule Commonly known as:  NEURONTIN Take 100 mg by mouth 2 (two) times daily.   glipiZIDE 5 MG tablet Commonly known as:  GLUCOTROL Take 1/2 pill daily   glucose blood test strip Commonly known as:  ACCU-CHEK AVIVA PLUS Use as instructed to check sugar 2 times daily.   JANUVIA 50 MG tablet Generic drug:  sitaGLIPtin take 1 tablet by mouth once daily   levothyroxine 125 MCG tablet Commonly known as:  SYNTHROID Take 0.5 tablets (62.5 mcg total) by mouth daily.   loratadine 10 MG tablet Commonly known as:  CLARITIN Take 10 mg by mouth daily.   lubiprostone 8 MCG capsule Commonly known as:  AMITIZA Take 1 capsule (8 mcg total) by mouth 2 (two) times daily with a meal.   meclizine 25 MG tablet Commonly known as:  ANTIVERT Take 25 mg by mouth 3 (three) times daily as needed for dizziness.   MUCUS RELIEF CHEST CONGESTION 400 MG Tabs tablet Generic drug:  guaifenesin Take 400 mg by mouth 2 (two) times daily as needed (cough/ congestion).   nitroGLYCERIN 0.1 mg/hr patch Commonly known as:  NITRODUR - Dosed in mg/24 hr Place 1 patch (0.1 mg total) onto the skin daily.   ofloxacin 0.3 % ophthalmic solution Commonly known as:  OCUFLOX Place 1 drop into both eyes as needed.   omeprazole 40 MG capsule Commonly known as:  PRILOSEC Take 1 capsule (40 mg total) by mouth 2 (two) times daily.   OVER THE COUNTER MEDICATION Take 1 Can by mouth daily. Enterex nutritional supplement - with choline   OVER THE COUNTER MEDICATION Take 1 tablet by mouth 2 (two) times daily with a meal. FD Guard   OVER THE COUNTER MEDICATION Take 1 tablet by mouth daily. Micro-algae DHA   OVER THE COUNTER MEDICATION Take 1 capsule by mouth daily. Choline 100 mg, trimethylglycine 50 mg, glutathione 50 mg, methyltetrahydrofolate, l-arginine-, l-ornithine, l-citruline   Potassium Chloride CR 8 MEQ Cpcr capsule CR Commonly known  as:  MICRO-K Take 1 capsule (8 mEq total) by mouth daily.   PROAIR HFA 108 (90 Base) MCG/ACT inhaler Generic drug:  albuterol Inhale 2 puffs into the lungs every 6 (six) hours as needed for wheezing or shortness of breath.   ranolazine 500 MG 12 hr tablet Commonly known as:  [name] Take 1 tablet (500 mg total) by mouth 2 (two) times daily.   REFRESH OP Place 1 drop into both eyes daily as needed (dry eyes).   rosuvastatin 20 MG tablet Commonly known as:  CRESTOR Take 1 tablet (20 mg total) by mouth daily.   senna-docusate 8.6-50  MG tablet Commonly known as:  SENOKOT S Take 1 tablet by mouth 2 (two) times daily. For constipation   traMADol 50 MG tablet Commonly known as:  ULTRAM Take 1 tablet (50 mg total) by mouth every 6 (six) hours as needed for moderate pain.   warfarin 3 MG tablet Commonly known as:  COUMADIN Take 1 to 1.5 tablets daily or as directed by coumadin clinic   warfarin 3 MG tablet Commonly known as:  COUMADIN take 1-1 and 1/2 tablets by mouth once daily or AS DIRECTED BY COUMADIN CLINIC   zolpidem 5 MG tablet Commonly known as:  AMBIEN Take 0.5 tablets (2.5 mg total) by mouth at bedtime. sleep       Past Medical History:  Diagnosis Date  . 2nd degree atrioventricular block    a. 06/22/15 Gen change: MDT ADDR01 Adapta, DC PPM (ser# [id number]).  . Allergy to perfume   . Anemia   . Arthritis   . Chronic combined systolic and diastolic CHF (congestive heart failure) ([place])    a. 01/2014 Echo: EF @ least mod-sev reduced with HK of lat/apical, basal inf walls. basalpost AK, Gr 1DD; b. 06/2015 Echo: EF 45-50%, Gr2 DD, mod LVH.   . CKD (chronic kidney disease), stage III    a. iii - iv.  . Coronary artery disease    a. 1994 s/p cabg;  b. 09/2013 MV: large, sev intensity, partially reversible inf, apical defect, prior inf/ap infarct w/ mild peri-infarct ischemia->Med Rx; c. 06/2015 NSTEMI (trop 6)->Med rx.  . Daily headache   . DVT of upper extremity (deep vein  thrombosis) ([place]) 06/13/2012   BUE  . Dyspepsia   . Gastric ulcer   . GERD (gastroesophageal reflux disease)   . H/O: GI bleed 12//13  . Hiatal hernia   . High cholesterol   . History of blood transfusion 2013  . Hypertensive heart disease   . Hypothyroid   . Legally blind   . PAF (paroxysmal atrial fibrillation) ([facility])    a. CHA2DS2VASc = 7-->coumadin.  . Presence of permanent cardiac pacemaker    a. 06/22/15 Gen change: MDT ADDR01 Adapta, DC PPM (ser# [id number]).  . Seasonal allergies   . Type II diabetes mellitus ([place])   . UTI (lower urinary tract infection)     Past Surgical History:  Procedure Laterality Date  . CARDIAC CATHETERIZATION  10/25/92  . CARDIAC CATHETERIZATION  11/18/03   w/grafts 100%CX LAD 80 & 100%  . CARDIAC CATHETERIZATION  01/24/05   diffuse disease of native vessels  . CARDIAC CATHETERIZATION  06/06/06   severe native CAD  . CARDIOVERSION  06/06/06   successful  . CATARACT EXTRACTION W/ INTRAOCULAR LENS  IMPLANT, BILATERAL Bilateral   . CORONARY ARTERY BYPASS GRAFT  10/27/92   LIMA to LAD,SVG to LAD second diagonal,obtuse maraginal of the CX and posterior descendingbranch of the RCA  . EP IMPLANTABLE DEVICE N/A 06/22/2015   Procedure: PPM/BIV PPM Generator Changeout;  Surgeon: Sanda Klein, MD;  Location: [facility];  Service: Cardiovascular;  Laterality: N/A;  . ESOPHAGOGASTRODUODENOSCOPY  12/22/2011   Procedure: ESOPHAGOGASTRODUODENOSCOPY (EGD);  Surgeon: Gatha Mayer, MD;  Location: [facility];  Service: Endoscopy;  Laterality: N/A;  . INSERT / REPLACE / REMOVE PACEMAKER  06/29/2006   Medtronic adapta  . REFRACTIVE SURGERY Bilateral     Family History  Problem Relation Age of Onset  . Heart disease Father   . Diabetes Father   . Breast cancer Daughter   . Diabetes  Son   . Diabetes Daughter   . Colon polyps Daughter   . Heart attack Brother   . Diabetes Brother   . Stroke Sister   . Colon cancer Neg Hx     Social History:  reports  that she has never smoked. She has never used smokeless tobacco. She reports that she does not drink alcohol or use drugs.  Allergies:  Allergies  Allergen Reactions  . Darvon Other (See Comments)    confusion  . Digoxin And Related Nausea Only  . Penicillins Other (See Comments)    Was told had allergy from childhood... Unknown reaction  . Percocet [Oxycodone-Acetaminophen] Other (See Comments)    confusion  . Percodan [Oxycodone-Aspirin] Other (See Comments)    confusion  . Vicodin [Hydrocodone-Acetaminophen] Other (See Comments)    confusion    REVIEW of systems:  Hypothyroidism: She is taking levothyroxine  62.5 mcg daily  Previously TSH was high when she was taking 50 g She only complains about occasional chills but no cold intolerance   Lab Results  Component Value Date   TSH 1.68 03/02/2016   TSH 2.02 12/01/2015   TSH 7.99 (H) 07/07/2015   FREET4 0.70 06/25/2014   FREET4 0.65 06/24/2013   FREET4 0.93 01/24/2013     She has had renal dysfunction which is inconsistent   Lab Results  Component Value Date   CREATININE 1.52 (H) 03/02/2016    She has significant neuropathy with sensory loss on previous  foot exam.   She has had hyperlipidemia number not clear this has been followed recently by PCP.   Since her LDL was significantly high at 132 she was told to increase her Crestor up to 20 mg   Lab Results  Component Value Date   CHOL 222 (H) 12/01/2015   HDL 32.20 (L) 12/01/2015   LDLCALC 141 (H) 10/25/2015   LDLDIRECT 132.0 03/02/2016   TRIG 244.0 (H) 12/01/2015   CHOLHDL 7 12/01/2015      Examination:   BP (!) 100/54   Pulse 66   Ht _0  (1.626 m)   Wt 175 lb (79.4 kg)   SpO2 96%   BMI 30.04 kg/m   Body mass index is 30.04 kg/m.    Assesment/plan:   1.  Diabetes type 2, with obesity, long-standing   See history of present illness for  discussion of current diabetes management, blood sugar patterns and problems identified  Her last  A1c was 6.3 although this is relatively lower than expected possibly because of her renal failure and possible anemia  She is taking only 2.5 mg glipizide at suppertime and blood sugars are nearly normal without hypoglycemia However as a precaution have asked family to give the medication only right after eating if able to eat at least half of her meal and make sure she is getting some carbohydrate at suppertime   2.  Hypothyroidism: Will check labs next visit   3.  Hyperlipidemia: Needs follow-up labs on the next visit, currently on Crestor 20 mg  Suki Crockett 03/23/2016, 2:06 PM        .

## 2016-03-27 NOTE — Telephone Encounter (Signed)
Continue with current diuretic dosage.

## 2016-03-29 ENCOUNTER — Telehealth: Payer: Self-pay | Admitting: *Deleted

## 2016-03-29 NOTE — Telephone Encounter (Signed)
Advised patient, verbalized understanding  

## 2016-03-29 NOTE — Telephone Encounter (Signed)
Left message to call back  

## 2016-03-29 NOTE — Telephone Encounter (Signed)
CALLED DAUGHTHER TO LET HER KNOW WE HAVE 2 PACKETS OF [name] 500 MG LEFT AT FRONT DESK ALONG WITH PA PAPERWORK

## 2016-04-06 ENCOUNTER — Telehealth: Payer: Self-pay | Admitting: Cardiovascular Disease

## 2016-04-06 ENCOUNTER — Ambulatory Visit (INDEPENDENT_AMBULATORY_CARE_PROVIDER_SITE_OTHER): Payer: Medicare HMO | Admitting: Pharmacist

## 2016-04-06 DIAGNOSIS — Z7901 Long term (current) use of anticoagulants: Secondary | ICD-10-CM

## 2016-04-06 LAB — PROTIME-INR: INR: 1.1 (ref <=1.1)

## 2016-04-06 NOTE — Telephone Encounter (Signed)
Spoke withJoyce Kosier  She states that pt's weight today is  215. She states that pt is not SOB. Pt will take 40mg  today and tomorrow and call back if needed.

## 2016-04-06 NOTE — Telephone Encounter (Signed)
[name] calling, states that patient weight is 180 and states that patient needs adjustment with fluid pill. Please call to discuss, thanks.

## 2016-04-07 ENCOUNTER — Telehealth: Payer: Self-pay | Admitting: Cardiovascular Disease

## 2016-04-07 NOTE — Telephone Encounter (Signed)
[name] says she was supposed to call back and let Sharyn Lull know what pt's weight was today. She weighs 180 today.

## 2016-04-07 NOTE — Telephone Encounter (Signed)
Let's try furosemide 40 mg 5 days a week, 20 mg 2 days a week instead.

## 2016-04-07 NOTE — Telephone Encounter (Signed)
Caller advised. She'll have patient take 40mg  5 days out of the week. Pt took last 20mg  on Tuesday, I suggested making Saturday and Tuesday the lower dose days. She's aware to call if the weights do not decrease over weekend. Voiced thanks for call.

## 2016-04-07 NOTE — Telephone Encounter (Signed)
Pt of Dr. Sallyanne Kuster  Re: wt gain Returned Joyce's call. Notes patient takes alternating doses of furosemide (40mg  3 days a week, 20mg  4 days a week). Has had a recent trend of increased weights, but patient denies SOB. No obvious swelling in ankle/lower extremities, daughter notes pt might have some more abdominal swelling, however.  She read uncorrected weights (with wheelchair): 18th - 212.5 19th -  20th - 213.5 21st - 214.5 22nd - 215 23rd - 215  From 02/29/16 OV note:  "She denies any problems with shortness of breath and her edema is gone. Her weight has been 205-209 on the home scale that allows her to be weighed on her wheelchair. The wheelchair itself weighs 35 pounds."   She took 40mg  lasix yesterday, I advised to repeat that dose this AM. Will seek further recommendations from Dr. Sallyanne Kuster on dosing over weekend.  Daughter voices that if she does not answer, OK to leave msg w instructions.

## 2016-04-10 ENCOUNTER — Telehealth: Payer: Self-pay | Admitting: Cardiovascular Disease

## 2016-04-10 NOTE — Telephone Encounter (Signed)
New Message   Per pt daughter do you need to make adjustment in fluid pill   Weight 216.5-35 (weight of wheel chair) 181.5 is her weight

## 2016-04-10 NOTE — Telephone Encounter (Signed)
No answer, VM box full.

## 2016-04-11 ENCOUNTER — Ambulatory Visit (INDEPENDENT_AMBULATORY_CARE_PROVIDER_SITE_OTHER): Payer: Medicare HMO | Admitting: Pharmacist

## 2016-04-11 DIAGNOSIS — Z7901 Long term (current) use of anticoagulants: Secondary | ICD-10-CM

## 2016-04-11 LAB — PROTIME-INR: INR: 1.8 — AB (ref <=1.1)

## 2016-04-11 NOTE — Telephone Encounter (Signed)
Returning call from yesterday.today pt's weight is 180.

## 2016-04-11 NOTE — Telephone Encounter (Signed)
Daughter called w weight trends over weekend. (Corrected to subtract 35 lb wheelchair weight)  Sun - 181  Mon - 179.5 Tue - 180  Pt's lasix regimen: 40mg  daily except Tues and Sat (20mg ) No new symptoms/SOB. No increase in swelling.  Aware I will route to Dr. Sallyanne Kuster for [facility] and call back if further adjustments to lasix warranted.

## 2016-04-11 NOTE — Telephone Encounter (Signed)
Let's just stick with current regimen. Please call if weight >183 lb MCr

## 2016-04-11 NOTE — Telephone Encounter (Signed)
[name] advised on instruction to keep patient on current dosing regimen, call if wt >183. She voiced understanding and thanks.

## 2016-04-18 ENCOUNTER — Ambulatory Visit (INDEPENDENT_AMBULATORY_CARE_PROVIDER_SITE_OTHER): Payer: Medicare HMO | Admitting: Pharmacist

## 2016-04-18 DIAGNOSIS — Z7901 Long term (current) use of anticoagulants: Secondary | ICD-10-CM

## 2016-04-18 LAB — PROTIME-INR: INR: 2.1 — AB (ref <=1.1)

## 2016-04-25 ENCOUNTER — Encounter: Payer: Self-pay | Admitting: Gastroenterology

## 2016-04-25 ENCOUNTER — Ambulatory Visit (INDEPENDENT_AMBULATORY_CARE_PROVIDER_SITE_OTHER): Payer: Medicare HMO | Admitting: Gastroenterology

## 2016-04-25 VITALS — BP 98/60 | Ht 63.0 in

## 2016-04-25 DIAGNOSIS — K581 Irritable bowel syndrome with constipation: Secondary | ICD-10-CM | POA: Diagnosis not present

## 2016-04-25 DIAGNOSIS — R109 Unspecified abdominal pain: Secondary | ICD-10-CM | POA: Diagnosis not present

## 2016-04-25 DIAGNOSIS — R1013 Epigastric pain: Secondary | ICD-10-CM | POA: Diagnosis not present

## 2016-04-25 DIAGNOSIS — K5900 Constipation, unspecified: Secondary | ICD-10-CM

## 2016-04-25 NOTE — Progress Notes (Signed)
[name]    [id number]    1920/08/27  Primary Care Physician:[name] Wilhelmina Mcardle, MD  Referring Physician: [name], MD [address] [place],  [address]  Chief complaint:  Constipation, Mid-lower abdominal discomfort  HPI:  83 year old African-American female here for follow-up visit accompanied by her daughter. Dyspepsia has improved with probiotic align once daily. She is currently having bowel movements about once a week when she takes magnesium citrate and sometimes she has liquid bowel movement the subsequent day as well after max citrate. She has discomfort in the lower to mid abdomen when its time for her to take mag citrate. IB Donald Prose is helping with bloating and abdominal discomfort. Denies any nausea, vomiting, dysphagia, melena or blood per rectum.   Outpatient Encounter Prescriptions as of 04/25/2016  Medication Sig  . ACCU-CHEK FASTCLIX LANCETS MISC Use to check sugar 2 times daily  . acetaminophen (TYLENOL) 500 MG tablet Take 500 mg by mouth every 6 (six) hours as needed (pain).  Marland Kitchen albuterol (PROAIR HFA) 108 (90 Base) MCG/ACT inhaler Inhale 2 puffs into the lungs every 6 (six) hours as needed for wheezing or shortness of breath.  Marland Kitchen atropine 1 % ophthalmic solution Place 1 drop into both eyes daily as needed (burning eyes).   . Blood Glucose Monitoring Suppl (ACCU-CHEK AVIVA PLUS) w/Device KIT Use to check sugars 2 times daily.  . cholecalciferol (VITAMIN D) 1000 units tablet Take 1,000 Units by mouth daily. Reported on 07/30/2015  . donepezil (ARICEPT) 5 MG tablet Take 5 mg by mouth at bedtime.   . folic acid (FOLVITE) 1 MG tablet Take 1 tablet (1 mg total) by mouth daily.  . furosemide (LASIX) 40 MG tablet Take 1 tablet (40 mg total) by mouth daily. (Patient taking differently: Take 40 mg by mouth daily. Patient taking 1/2 pill for 4 days a week and a whole pill 3 days a week)  . gabapentin (NEURONTIN) 100 MG capsule Take 100 mg by mouth 2  (two) times daily.   Marland Kitchen glipiZIDE (GLUCOTROL) 5 MG tablet Take 1/2 pill daily  . glucose blood (ACCU-CHEK AVIVA PLUS) test strip Use as instructed to check sugar 2 times daily.  Marland Kitchen guaifenesin (MUCUS RELIEF CHEST CONGESTION) 400 MG TABS tablet Take 400 mg by mouth 2 (two) times daily as needed (cough/ congestion).  Marland Kitchen levothyroxine (SYNTHROID) 125 MCG tablet Take 0.5 tablets (62.5 mcg total) by mouth daily.  Marland Kitchen loratadine (CLARITIN) 10 MG tablet Take 10 mg by mouth daily.  . meclizine (ANTIVERT) 25 MG tablet Take 25 mg by mouth 3 (three) times daily as needed for dizziness.  . nitroGLYCERIN (NITRODUR - DOSED IN MG/24 HR) 0.1 mg/hr patch Place 1 patch (0.1 mg total) onto the skin daily.  Marland Kitchen ofloxacin (OCUFLOX) 0.3 % ophthalmic solution Place 1 drop into both eyes as needed.  Marland Kitchen omeprazole (PRILOSEC) 40 MG capsule Take 1 capsule (40 mg total) by mouth 2 (two) times daily.  Marland Kitchen OVER THE COUNTER MEDICATION Take 1 Can by mouth daily. Enterex nutritional supplement - with choline  . OVER THE COUNTER MEDICATION Take 1 tablet by mouth 2 (two) times daily with a meal. FD Guard  . OVER THE COUNTER MEDICATION Take 1 tablet by mouth daily. Micro-algae DHA  . OVER THE COUNTER MEDICATION Take 1 capsule by mouth daily. Choline 100 mg, trimethylglycine 50 mg, glutathione 50 mg, methyltetrahydrofolate, l-arginine-, l-ornithine, l-citruline  . Polyvinyl Alcohol-Povidone (REFRESH OP) Place 1  drop into both eyes daily as needed (dry eyes).   . Potassium Chloride CR (MICRO-K) 8 MEQ CPCR capsule CR Take 1 capsule (8 mEq total) by mouth daily.  . ranolazine ([name]) 500 MG 12 hr tablet Take 1 tablet (500 mg total) by mouth 2 (two) times daily.  . rosuvastatin (CRESTOR) 20 MG tablet Take 1 tablet (20 mg total) by mouth daily.  Marland Kitchen senna-docusate (SENOKOT S) 8.6-50 MG tablet Take 1 tablet by mouth 2 (two) times daily. For constipation  . traMADol (ULTRAM) 50 MG tablet Take 1 tablet (50 mg total) by mouth every 6 (six) hours as  needed for moderate pain. (Patient taking differently: Take 50 mg by mouth 2 (two) times daily as needed for moderate pain. )  . warfarin (COUMADIN) 3 MG tablet Take 1 to 1.5 tablets daily or as directed by coumadin clinic  . warfarin (COUMADIN) 3 MG tablet take 1-1 and 1/2 tablets by mouth once daily or AS DIRECTED BY COUMADIN CLINIC  . zolpidem (AMBIEN) 5 MG tablet Take 0.5 tablets (2.5 mg total) by mouth at bedtime. sleep (Patient taking differently: Take 5 mg by mouth. sleep)  . [DISCONTINUED] ciprofloxacin (CIPRO) 250 MG tablet Take 250 mg by mouth 2 (two) times daily.  . [DISCONTINUED] diclofenac sodium (VOLTAREN) 1 % GEL Apply 4 g topically 3 (three) times daily. To right knee  . [DISCONTINUED] lubiprostone (AMITIZA) 8 MCG capsule Take 1 capsule (8 mcg total) by mouth 2 (two) times daily with a meal.   No facility-administered encounter medications on file as of 04/25/2016.     Allergies as of 04/25/2016 - Review Complete 04/25/2016  Allergen Reaction Noted  . Darvon Other (See Comments) 03/14/2011  . Digoxin and related Nausea Only 04/22/2012  . Penicillins Other (See Comments) 12/17/2011  . Percocet [oxycodone-acetaminophen] Other (See Comments) 03/14/2011  . Percodan [oxycodone-aspirin] Other (See Comments) 09/12/2011  . Vicodin [hydrocodone-acetaminophen] Other (See Comments) 12/18/2011    Past Medical History:  Diagnosis Date  . 2nd degree atrioventricular block    a. 06/22/15 Gen change: MDT ADDR01 Adapta, DC PPM (ser# [id number]).  . Allergy to perfume   . Anemia   . Arthritis   . Chronic combined systolic and diastolic CHF (congestive heart failure) ([place])    a. 01/2014 Echo: EF @ least mod-sev reduced with HK of lat/apical, basal inf walls. basalpost AK, Gr 1DD; b. 06/2015 Echo: EF 45-50%, Gr2 DD, mod LVH.   . CKD (chronic kidney disease), stage III    a. iii - iv.  . Coronary artery disease    a. 1994 s/p cabg;  b. 09/2013 MV: large, sev intensity, partially reversible inf,  apical defect, prior inf/ap infarct w/ mild peri-infarct ischemia->Med Rx; c. 06/2015 NSTEMI (trop 6)->Med rx.  . Daily headache   . DVT of upper extremity (deep vein thrombosis) ([place]) 06/13/2012   BUE  . Dyspepsia   . Gastric ulcer   . GERD (gastroesophageal reflux disease)   . H/O: GI bleed 12//13  . Hiatal hernia   . High cholesterol   . History of blood transfusion 2013  . Hypertensive heart disease   . Hypothyroid   . Legally blind   . PAF (paroxysmal atrial fibrillation) ([facility])    a. CHA2DS2VASc = 7-->coumadin.  . Presence of permanent cardiac pacemaker    a. 06/22/15 Gen change: MDT ADDR01 Adapta, DC PPM (ser# [id number]).  . Seasonal allergies   . Type II diabetes mellitus ([place])   . UTI (lower urinary tract infection)  Past Surgical History:  Procedure Laterality Date  . CARDIAC CATHETERIZATION  10/25/92  . CARDIAC CATHETERIZATION  11/18/03   w/grafts 100%CX LAD 80 & 100%  . CARDIAC CATHETERIZATION  01/24/05   diffuse disease of native vessels  . CARDIAC CATHETERIZATION  06/06/06   severe native CAD  . CARDIOVERSION  06/06/06   successful  . CATARACT EXTRACTION W/ INTRAOCULAR LENS  IMPLANT, BILATERAL Bilateral   . CORONARY ARTERY BYPASS GRAFT  10/27/92   LIMA to LAD,SVG to LAD second diagonal,obtuse maraginal of the CX and posterior descendingbranch of the RCA  . EP IMPLANTABLE DEVICE N/A 06/22/2015   Procedure: PPM/BIV PPM Generator Changeout;  Surgeon: Sanda Klein, MD;  Location: [facility];  Service: Cardiovascular;  Laterality: N/A;  . ESOPHAGOGASTRODUODENOSCOPY  12/22/2011   Procedure: ESOPHAGOGASTRODUODENOSCOPY (EGD);  Surgeon: Gatha Mayer, MD;  Location: [facility];  Service: Endoscopy;  Laterality: N/A;  . INSERT / REPLACE / REMOVE PACEMAKER  06/29/2006   Medtronic adapta  . REFRACTIVE SURGERY Bilateral     Family History  Problem Relation Age of Onset  . Heart disease Father   . Diabetes Father   . Breast cancer Daughter   . Diabetes Son   .  Diabetes Daughter   . Colon polyps Daughter   . Heart attack Brother   . Diabetes Brother   . Stroke Sister   . Colon cancer Neg Hx     Social History   Social History  . Marital status: Widowed    Spouse name: N/A  . Number of children: 10  . Years of education: N/A   Occupational History  .  Retired   Social History Main Topics  . Smoking status: Never Smoker  . Smokeless tobacco: Never Used  . Alcohol use No  . Drug use: No  . Sexual activity: No   Other Topics Concern  . Not on file   Social History Narrative  . No narrative on file      Review of systems: Review of Systems  Constitutional: Negative for fever and chills.  HENT: Difficulty hearing   Eyes: Negative for blurred vision.  Respiratory: Negative for cough, shortness of breath and wheezing.   Cardiovascular: Negative for chest pain and palpitations.  Gastrointestinal: as per HPI Genitourinary: Negative for dysuria, urgency, frequency and hematuria.  Musculoskeletal: Negative for myalgias, back pain and joint pain.  Skin: Negative for itching and rash.  Neurological: Positive for for dizziness, tremors, and walking imbalance. Negative for focal weakness, seizures and loss of consciousness.  Endo/Heme/Allergies: Positive for seasonal allergies.  Psychiatric/Behavioral: Negative for depression, suicidal ideas and hallucinations.  All other systems reviewed and are negative.   Physical Exam: Vitals:   04/25/16 1023  BP: 98/60   There is no height or weight on file to calculate BMI. Gen:      No acute distress, wheelchair bound HEENT:  EOMI, sclera anicteric Neck:     No masses; no thyromegaly Lungs:    Clear to auscultation bilaterally; normal respiratory effort CV:         Regular rate and rhythm; no murmurs Abd:      + bowel sounds; soft, non-tender; no palpable masses, no distension Ext:    No edema; adequate peripheral perfusion Skin:      Warm and dry; no rash Neuro: alert and oriented x  3 Psych: normal mood and affect  Data Reviewed:  Reviewed labs, radiology imaging, old records and pertinent past GI work up   Assessment  and Plan/Recommendations:  83 year old female with multiple comorbid conditions including congestive heart failure here for follow-up visit  Chronic constipation likely playing a role with increased abdominal discomfort and distention Her daughter is giving her magnesium citrate weekly with bowel movements once a week Advise patient to start Amitiza 8 g twice daily, if no improvement we'll consider increasing to 24 g twice daily Increase fluid intake and dietary fiber Continue IB Gard and probiotic align one capsule daily Return as needed    Damaris Hippo , MD [phone] Mon-Fri 8a-5p [phone] after 5p, weekends, holidays  CC: [name], MD

## 2016-04-25 NOTE — Patient Instructions (Signed)
If you are age 81 or older, your body mass index should be between 23-30. Your There is no height or weight on file to calculate BMI. If this is out of the aforementioned range listed, please consider follow up with your Primary Care Provider.  If you are age 69 or younger, your body mass index should be between 19-25. Your There is no height or weight on file to calculate BMI. If this is out of the aformentioned range listed, please consider follow up with your Primary Care Provider.   We have sent the following medications to your pharmacy for you to pick up at your convenience:  Seaman have been given samples of IBgard to use as instructed.  Please follow up as needed with Dr. Silverio Decamp.  Thank you

## 2016-04-26 ENCOUNTER — Telehealth: Payer: Self-pay | Admitting: Gastroenterology

## 2016-04-26 NOTE — Telephone Encounter (Signed)
Left the instructions for her. Take Amitiza 8 mcg twice a day with food. Call if she develops diarrhea

## 2016-04-26 NOTE — Telephone Encounter (Signed)
Amitiza 70mcg BID. Thanks

## 2016-04-26 NOTE — Telephone Encounter (Signed)
[name] thinks the patient has Amitiza 8 mcg. Do you want her to take this BID?  Patient is 89 yrs with elevated Creatinine. Thanks

## 2016-05-02 LAB — PROTIME-INR: INR: 2.2 — AB (ref <=1.1)

## 2016-05-03 ENCOUNTER — Ambulatory Visit (INDEPENDENT_AMBULATORY_CARE_PROVIDER_SITE_OTHER): Payer: Medicare HMO | Admitting: Pharmacist

## 2016-05-03 DIAGNOSIS — Z7901 Long term (current) use of anticoagulants: Secondary | ICD-10-CM

## 2016-05-15 ENCOUNTER — Telehealth: Payer: Self-pay | Admitting: Cardiovascular Disease

## 2016-05-15 NOTE — Telephone Encounter (Signed)
New message     Pt is having a lot of pain in chest and in her side, in her legs , what do you want them to do ? Does she need to get her mom an appt

## 2016-05-15 NOTE — Telephone Encounter (Signed)
Spoke with patient and she is having bilateral leg pain, bilateral side pain, and chest pain across the chest radiated up both sides of neck .  Advised patient needs to call PCP and would forward to Dr Sallyanne Kuster for review Left message to call back

## 2016-05-16 ENCOUNTER — Ambulatory Visit (INDEPENDENT_AMBULATORY_CARE_PROVIDER_SITE_OTHER): Payer: Medicare HMO | Admitting: Pharmacist Clinician (PhC)/ Clinical Pharmacy Specialist

## 2016-05-16 DIAGNOSIS — Z7901 Long term (current) use of anticoagulants: Secondary | ICD-10-CM

## 2016-05-16 LAB — POCT INR: INR: 2.2

## 2016-05-16 NOTE — Telephone Encounter (Signed)
Advised daughter 

## 2016-05-16 NOTE — Telephone Encounter (Signed)
Agree with advice

## 2016-05-19 ENCOUNTER — Other Ambulatory Visit: Payer: Self-pay | Admitting: *Deleted

## 2016-05-19 NOTE — Patient Outreach (Signed)
[facility]) Care Management  05/19/2016  ANN BOHNE [birth year] [id number]  RN Health Coach  attempted #1 Follow up outreach call to patient.  Patient was unavailable. HIPPA compliance voicemail message was left with return callback number.  Plan: RN will call patient again within 14 days.     [facility] Management [phone]

## 2016-05-22 ENCOUNTER — Telehealth: Payer: Self-pay | Admitting: Cardiovascular Disease

## 2016-05-22 NOTE — Addendum Note (Signed)
Addended by: Fidel Levy on: 05/22/2016 12:11 PM   Modules accepted: Orders

## 2016-05-22 NOTE — Telephone Encounter (Signed)
New Message  Pts daughter voiced questioning her fluid pill and her weight and would like to speak to nurse.

## 2016-05-22 NOTE — Telephone Encounter (Signed)
Let's try the following: Lasix 40 mg on Monday Wednesday Friday, 20 mg other days of the week. If weight reaches 170 pounds, increase furosemide to 40 mg every day until back under 170 pounds, then back to the system as above

## 2016-05-22 NOTE — Telephone Encounter (Signed)
Returned call to daughter & provided MD advice on med changes. She voiced understanding and requested a copy of the changes in writing. Med list updated & printed and placed at front desk for pick up.

## 2016-05-22 NOTE — Telephone Encounter (Signed)
Dr. Sallyanne Kuster -  Returned call to daughter She states patient has been losing weight She is down to 167lbs Patient is still taking same diuretic regimen  -- lasix 40mg  5x/week  -- lasix 20mg  2x/week BP has been running low - mid [id number] [id number] systolic Daughter states patient is feeling OK  -- c/o stomach pain & pain in her feet   -- has arthritis Daughter is worried about patient getting too dry  Will defer to MD for advice on diuretic dosing  Device Clinic -  Daughter also states they received remote monitoring in the mail and wanted to know when she is scheduled for that. I advised I do not see naything on the schedule & will send a message to device clinic for advise/appt

## 2016-05-22 NOTE — Telephone Encounter (Signed)
Spoke with Ms. Swingle and informed her that her next remote transmission has been scheduled for 05/29/2016 and can be sent anytime that day, she voiced understanding.

## 2016-05-26 ENCOUNTER — Telehealth: Payer: Self-pay | Admitting: Cardiovascular Disease

## 2016-05-26 NOTE — Telephone Encounter (Signed)
°  New Prob   Requesting to speak to pharmacist regarding pts currently regimen for Warfarin. Please call.

## 2016-05-26 NOTE — Telephone Encounter (Signed)
Confirmed current warfarin dose with daughter.

## 2016-05-29 ENCOUNTER — Encounter: Payer: Medicare HMO | Admitting: *Deleted

## 2016-05-29 ENCOUNTER — Telehealth: Payer: Self-pay | Admitting: Cardiology

## 2016-05-29 NOTE — Telephone Encounter (Signed)
Spoke with pt and reminded pt of remote transmission that is due today. Pt verbalized understanding.   

## 2016-06-01 ENCOUNTER — Encounter: Payer: Self-pay | Admitting: Cardiology

## 2016-06-01 ENCOUNTER — Telehealth: Payer: Self-pay | Admitting: Cardiovascular Disease

## 2016-06-01 DIAGNOSIS — I5043 Acute on chronic combined systolic (congestive) and diastolic (congestive) heart failure: Secondary | ICD-10-CM

## 2016-06-01 DIAGNOSIS — Z7901 Long term (current) use of anticoagulants: Secondary | ICD-10-CM

## 2016-06-01 DIAGNOSIS — I10 Essential (primary) hypertension: Secondary | ICD-10-CM

## 2016-06-01 DIAGNOSIS — I82409 Acute embolism and thrombosis of unspecified deep veins of unspecified lower extremity: Secondary | ICD-10-CM

## 2016-06-01 NOTE — Telephone Encounter (Signed)
Follow up    Pt states that she is calling back, she was speaking to someone and got disconnected.

## 2016-06-01 NOTE — Telephone Encounter (Signed)
Returned call to patient's daughter [name] she stated furosemide was increased to 40 mg daily on 5/7 until weight comes down to 170 lbs.Stated she wanted to let Dr.Croitoru know her weight continues to be 173 to 174 lbs.I will send message to Dr.Croitoru for advice.

## 2016-06-01 NOTE — Telephone Encounter (Signed)
Returned call to patient's daughter Joyce no answer.LMTC. 

## 2016-06-01 NOTE — Telephone Encounter (Signed)
Let's try 80 mg daily. Is she getting any extra salt in diet? Labs next week please - BMET, BNP, Mg. MCr

## 2016-06-01 NOTE — Telephone Encounter (Signed)
See previous 06/01/16 message.

## 2016-06-01 NOTE — Telephone Encounter (Signed)
New Message  Pt daughter call requesting to speak with RN about mothers weight. Pt daughter states pt has been 174 for 3-4 days and would like to know if she needs to increase medication. Please call back to discuss

## 2016-06-02 MED ORDER — FUROSEMIDE 40 MG PO TABS: ORAL_TABLET | ORAL | 6 refills | Status: DC

## 2016-06-02 NOTE — Telephone Encounter (Signed)
Spoke to patient's daughter [name].Dr.Croitoru's recommendations given.Pt is not eating any extra salt.She will take Lasix 80 mg daily.Bmet,Bnp,Mag to be done next Friday 06/09/16 at [facility] at this office.Lab orders mailed.

## 2016-06-02 NOTE — Addendum Note (Signed)
Addended by: Kathyrn Lass on: 06/02/2016 03:04 PM   Modules accepted: Orders

## 2016-06-05 ENCOUNTER — Ambulatory Visit: Payer: Medicare HMO | Admitting: Endocrinology

## 2016-06-05 DIAGNOSIS — Z0289 Encounter for other administrative examinations: Secondary | ICD-10-CM

## 2016-06-06 ENCOUNTER — Telehealth: Payer: Self-pay | Admitting: Cardiovascular Disease

## 2016-06-06 ENCOUNTER — Telehealth: Payer: Self-pay | Admitting: Gastroenterology

## 2016-06-06 NOTE — Telephone Encounter (Signed)
Per patient's daughter the patient's weight has been:  18th 174 19th 176 20th 174.5 21st 174.5 22nd 175.  The daughter stated that the patient's weight was supposed to be under 170 lbs per previous office visits. She is currently taking furosemide 80 mg daily.   The daughter would also like to know if the patient should be on Crestor 20 mg or 10 mg. The Endocrinologist had increased it to 20 mg due to increased cholesterol levels. Will talk to the physician for recommendations.

## 2016-06-06 NOTE — Telephone Encounter (Signed)
Left message to call back, per DPR. 

## 2016-06-06 NOTE — Telephone Encounter (Signed)
From what I can see she only takes FDGard and omeprazole , Dr Silverio Decamp please advise if she can come off these medications

## 2016-06-06 NOTE — Telephone Encounter (Signed)
Daughter says pt 's weight is not going down that much,it is at 175i now. She also wants to know what dose of Crestor should the pt be taking? Dr C put her on 10mg  and a week later dr Dwyane Dee put her on 20mg .

## 2016-06-06 NOTE — Telephone Encounter (Signed)
Left message to call back  

## 2016-06-07 MED ORDER — METOLAZONE 2.5 MG PO TABS: 2.5000 mg | ORAL_TABLET | ORAL | 3 refills | Status: DC

## 2016-06-07 NOTE — Telephone Encounter (Signed)
Per the daughter, the patient was down to 172 lbs today. Metolazone 2.5 mg will be called in for the patient. Per Dr. Sallyanne Kuster, if the patient maintains over the 170 mark then she can take the Metolazone 2.5 mg one tablet two times a week, Tuesday and Thursday. If the patient does need to take this medication the daughter should call to give the clinic an update. The daughter verbalized her understanding.

## 2016-06-07 NOTE — Telephone Encounter (Signed)
Called and left message for patients daughter [name] Dr Jillyn Hidden recommendations

## 2016-06-07 NOTE — Telephone Encounter (Signed)
She can take both as needed. Thanks

## 2016-06-07 NOTE — Telephone Encounter (Signed)
The daughter stated that she needed for me to call back between 10 and 10:30.

## 2016-06-13 ENCOUNTER — Other Ambulatory Visit: Payer: Self-pay | Admitting: Pharmacist

## 2016-06-13 ENCOUNTER — Telehealth: Payer: Self-pay | Admitting: Gastroenterology

## 2016-06-13 ENCOUNTER — Other Ambulatory Visit: Payer: Self-pay

## 2016-06-13 ENCOUNTER — Other Ambulatory Visit: Payer: Self-pay | Admitting: *Deleted

## 2016-06-13 ENCOUNTER — Ambulatory Visit (INDEPENDENT_AMBULATORY_CARE_PROVIDER_SITE_OTHER): Payer: Medicare HMO | Admitting: *Deleted

## 2016-06-13 ENCOUNTER — Encounter: Payer: Self-pay | Admitting: *Deleted

## 2016-06-13 ENCOUNTER — Telehealth: Payer: Self-pay | Admitting: Cardiovascular Disease

## 2016-06-13 ENCOUNTER — Ambulatory Visit (INDEPENDENT_AMBULATORY_CARE_PROVIDER_SITE_OTHER): Payer: Medicare HMO | Admitting: Pharmacist Clinician (PhC)/ Clinical Pharmacy Specialist

## 2016-06-13 DIAGNOSIS — Z7901 Long term (current) use of anticoagulants: Secondary | ICD-10-CM

## 2016-06-13 DIAGNOSIS — I442 Atrioventricular block, complete: Secondary | ICD-10-CM | POA: Diagnosis not present

## 2016-06-13 LAB — POCT INR: INR: 2.2

## 2016-06-13 MED ORDER — POTASSIUM CHLORIDE ER 8 MEQ PO CPCR: 8.0000 meq | ORAL_CAPSULE | Freq: Every day | ORAL | 1 refills | Status: DC

## 2016-06-13 MED ORDER — NITROGLYCERIN 0.1 MG/HR TD PT24: 0.1000 mg | MEDICATED_PATCH | Freq: Every day | TRANSDERMAL | 3 refills | Status: DC

## 2016-06-13 MED ORDER — FUROSEMIDE 40 MG PO TABS: ORAL_TABLET | ORAL | 1 refills | Status: DC

## 2016-06-13 MED ORDER — WARFARIN SODIUM 3 MG PO TABS
ORAL_TABLET | ORAL | 1 refills | Status: DC
Start: 2016-06-13 — End: 2016-09-11

## 2016-06-13 NOTE — Telephone Encounter (Signed)
Taking Amitiza 3 to 4 times a week. Uses the Mag Citrate once a week. The patient had a soft bowel movement last night. She will go ahead with the Mag Citrate as is her normal routine. Her constipation is less than it has been. Responding to Amitiza though she still uses Mag Citrate. Will consider a half of her normal dosage of Mag Citrate. Monitor for diarrhea. If that happens, she will d/c mag Citrate. Call if she has any issues or questions.

## 2016-06-13 NOTE — Telephone Encounter (Signed)
Continue Amitiza and  Use mag citrate as needed. Thanks

## 2016-06-13 NOTE — Telephone Encounter (Signed)
Mrs.Traughber is calling to get help with sending a transmission . Please call

## 2016-06-13 NOTE — Patient Outreach (Signed)
[facility]) Care Management  06/13/2016  [name] [birth year] [id number]   RN Health Coach received return  telephone call from  Patient daughter.  Hipaa compliance verified. This patient PCP is Dr Kevan Ny. This is not a [facility] provider. Daughter informed that we are unable to follow her mother at this time.  Plan: RN Health Coach will close at this time due to PCP not a [facility] provider. [facility] Care Management [phone]

## 2016-06-13 NOTE — Telephone Encounter (Signed)
Transmission received at 12:53pm today.

## 2016-06-15 NOTE — Progress Notes (Signed)
Remote pacemaker transmission.   

## 2016-06-16 ENCOUNTER — Telehealth: Payer: Self-pay | Admitting: Endocrinology

## 2016-06-16 LAB — CUP PACEART REMOTE DEVICE CHECK
Battery Impedance: 100 Ohm
Battery Remaining Longevity: 127 mo
Brady Statistic AP VP Percent: 3 %
Brady Statistic AS VP Percent: 97 %
Brady Statistic AS VS Percent: 0 %
Implantable Lead Implant Date: 20080613
Implantable Lead Location: 753860
Implantable Lead Model: 4092
Implantable Lead Model: 5076
Implantable Pulse Generator Implant Date: 20170606
Lead Channel Impedance Value: 418 Ohm
Lead Channel Impedance Value: 629 Ohm
Lead Channel Pacing Threshold Amplitude: 0.875 V
Lead Channel Setting Pacing Amplitude: 2.5 V
Lead Channel Setting Sensing Sensitivity: 5.6 mV
MDC IDC LEAD IMPLANT DT: 20080613
MDC IDC LEAD LOCATION: 753859
MDC IDC MSMT BATTERY VOLTAGE: 2.8 V
MDC IDC MSMT LEADCHNL RA PACING THRESHOLD PULSEWIDTH: 0.4 ms
MDC IDC MSMT LEADCHNL RV PACING THRESHOLD AMPLITUDE: 0.875 V
MDC IDC MSMT LEADCHNL RV PACING THRESHOLD PULSEWIDTH: 0.4 ms
MDC IDC SESS DTM: 20180529165344
MDC IDC SET LEADCHNL RA PACING AMPLITUDE: 1.75 V
MDC IDC SET LEADCHNL RV PACING PULSEWIDTH: 0.4 ms
MDC IDC STAT BRADY AP VS PERCENT: 0 %

## 2016-06-16 NOTE — Telephone Encounter (Signed)
Patient's daughter called to request a refill on all of the patient's diabetic medications (strips, solution and lantus) and for them to be sent to  [place], [place] [phone] (Phone) [phone] (Fax)   Please call the patient's daughter to advise.

## 2016-06-20 LAB — BASIC METABOLIC PANEL
BUN / CREAT RATIO: 21 (ref 12–28)
BUN: 28 mg/dL (ref 10–36)
CHLORIDE: 99 mmol/L (ref 96–106)
CO2: 30 mmol/L — ABNORMAL HIGH (ref 18–29)
Calcium: 8.9 mg/dL (ref 8.7–10.3)
Creatinine, Ser: 1.34 mg/dL — ABNORMAL HIGH (ref 0.57–1.00)
GFR, EST AFRICAN AMERICAN: 39 mL/min/{1.73_m2} — AB (ref >59)
GFR, EST NON AFRICAN AMERICAN: 34 mL/min/{1.73_m2} — AB (ref >59)
Glucose: 153 mg/dL — ABNORMAL HIGH (ref 65–99)
POTASSIUM: 4.7 mmol/L (ref 3.5–5.2)
SODIUM: 141 mmol/L (ref 134–144)

## 2016-06-20 LAB — BRAIN NATRIURETIC PEPTIDE: BNP: 277.6 pg/mL — AB (ref 0.0–100.0)

## 2016-06-20 LAB — MAGNESIUM: MAGNESIUM: 2.6 mg/dL — AB (ref 1.6–2.3)

## 2016-06-21 ENCOUNTER — Other Ambulatory Visit: Payer: Self-pay

## 2016-06-21 MED ORDER — GLIPIZIDE 5 MG PO TABS: ORAL_TABLET | ORAL | 1 refills | Status: DC

## 2016-06-21 MED ORDER — ACCU-CHEK AVIVA PLUS W/DEVICE KIT: PACK | 0 refills | Status: AC

## 2016-06-21 MED ORDER — ROSUVASTATIN CALCIUM 20 MG PO TABS: 20.0000 mg | ORAL_TABLET | Freq: Every day | ORAL | 3 refills | Status: DC

## 2016-06-22 ENCOUNTER — Telehealth: Payer: Self-pay

## 2016-06-22 MED ORDER — DOCUSATE SODIUM 100 MG PO CAPS: 100.0000 mg | ORAL_CAPSULE | Freq: Two times a day (BID) | ORAL | 0 refills | Status: DC

## 2016-06-22 MED ORDER — POLYETHYLENE GLYCOL 3350 17 G PO PACK: 17.0000 g | PACK | ORAL | 0 refills | Status: DC

## 2016-06-22 NOTE — Telephone Encounter (Signed)
Spoke with [name], patient's daughter, okay per DPR. Results and recommendations reviewed. Patient uses 'citrate nitrate' once a week for constipation. Discussed with MCr - change to Miralax powder and Colace 100 mg BID. Advised daughter of recommendations. Medication list updated to reflect changes. Per daughter, she is not taking Senakot.

## 2016-06-23 ENCOUNTER — Encounter: Payer: Self-pay | Admitting: Cardiology

## 2016-07-03 ENCOUNTER — Telehealth: Payer: Self-pay | Admitting: Cardiovascular Disease

## 2016-07-03 NOTE — Telephone Encounter (Signed)
S/w Bernerd Pho she state to continue med's as ordered. BP is ok not to worry

## 2016-07-03 NOTE — Telephone Encounter (Signed)
Spoke with [name], patient's daughter, okay per DPR. She states that pt is taking lasix 80mg . Pt weight has been up lately  169-172 today her weight is 171. She will weigh pt in the morning and make sure she is still up before giving metolazone 2.5. (per prev Dr C note if weight >170 take metolazone 2.5 mg twice weekly) daughter states that also, she is concerned with pt's BP today it's 101/53 HR 60, yesterday 88/51 HR 63-am, 101/51 HR 62. Pt is taking all medications as ordered. Daughter states that pt has no other sx, cp or pressure, nausea, SOB, etc.. Please advise

## 2016-07-03 NOTE — Telephone Encounter (Signed)
Left detailed message as directed by [name], patient's daughter([id number]) to continue meds as discussed and to continue taking BP if it is in the<100 call back and let us know-continue BP log

## 2016-07-03 NOTE — Telephone Encounter (Signed)
New message     Pt c/o swelling: STAT is pt has developed SOB within 24 hours  1. How long have you been experiencing swelling?  awhile  2. Where is the swelling located? Not sure weight is fluctuating between 169-172   3.  Are you currently taking a "fluid pill"? Yes   4.  Are you currently SOB?  No   5.  Have you traveled recently?  Saturday she was traveling by car

## 2016-07-12 ENCOUNTER — Ambulatory Visit (INDEPENDENT_AMBULATORY_CARE_PROVIDER_SITE_OTHER): Payer: Medicare HMO | Admitting: Pharmacist

## 2016-07-12 DIAGNOSIS — Z7901 Long term (current) use of anticoagulants: Secondary | ICD-10-CM

## 2016-07-12 LAB — PROTIME-INR: INR: 2.2 — AB (ref <=1.1)

## 2016-07-13 ENCOUNTER — Telehealth: Payer: Self-pay | Admitting: Cardiovascular Disease

## 2016-07-13 NOTE — Telephone Encounter (Signed)
New Message     Pt wt is down to 163lbs do they need to discontinue the increase in fluid pills?  And return to regular dosage?

## 2016-07-13 NOTE — Telephone Encounter (Signed)
Left detail message on secure voicemail. patient only needs to use metolazone twice a week--  Only if weight is above 170 lbs. FROM last telephone note from DR C. Any question may cal back

## 2016-07-14 ENCOUNTER — Telehealth: Payer: Self-pay | Admitting: Cardiovascular Disease

## 2016-07-14 NOTE — Telephone Encounter (Signed)
Follow up   Pt daughter wants rn to call her about her vm that she received about pt medication

## 2016-07-14 NOTE — Telephone Encounter (Signed)
Pt's weight is 164 today and BP is running similar as when she called last time she states that BP now is 88/64 HR 71 and sometimes it is in the 100's. She will continue lasix 80mg , over the weekend and she will call back if BP worsens. She is asking how low is too low for pt's weight? I told her not to be concerned about that yet and if it continues to worsen over the week end to call back for further directions. She will hold metolazone today (this is only for wt >170).

## 2016-07-25 ENCOUNTER — Ambulatory Visit (INDEPENDENT_AMBULATORY_CARE_PROVIDER_SITE_OTHER): Payer: Medicare HMO | Admitting: Pharmacist

## 2016-07-25 DIAGNOSIS — Z7901 Long term (current) use of anticoagulants: Secondary | ICD-10-CM

## 2016-07-25 LAB — POCT INR: INR: 2.2

## 2016-08-01 ENCOUNTER — Telehealth: Payer: Self-pay | Admitting: Gastroenterology

## 2016-08-01 MED ORDER — AMITIZA 24 MCG PO CAPS: 24.0000 ug | ORAL_CAPSULE | Freq: Two times a day (BID) | ORAL | 3 refills | Status: DC

## 2016-08-01 NOTE — Telephone Encounter (Signed)
Patient's daughter reports that her mother is very constipated despite Amitiza.  Miralax is not helping and does not work for the patient. Daughter is asking if she can take Magnesium Citrate? If ok how often may she use this in conjunction with Amitiza?

## 2016-08-01 NOTE — Telephone Encounter (Signed)
Patient's daughter notified of recommendations and that new rx has been sent.

## 2016-08-01 NOTE — Telephone Encounter (Signed)
Please advise patient to increase Amitiza to 24 g twice daily. Okay to use magnesium citrate as needed if she hasn't had a bowel movement for greater than 5 days.

## 2016-08-09 ENCOUNTER — Telehealth: Payer: Self-pay | Admitting: Cardiovascular Disease

## 2016-08-09 NOTE — Telephone Encounter (Signed)
Joyce(Daughter) is calling about Mrs. Recine fluid pill(Furosomide) wants to know if the dosage can be changed . Please call

## 2016-08-09 NOTE — Telephone Encounter (Signed)
BP acceptable if no dizziness/syncope. MCr

## 2016-08-09 NOTE — Telephone Encounter (Signed)
Returned call to [name] (daughter) Weight has not been over 170lbs Patient stopped metolazone (used PRN for weight >170lbs) and is taking lasix 40mg  daily [name] reports BP has been doing OK -- A few times 83, 87 systolic -- Normally low 901Q systolic  Daughter wanted MD to be aware of patient status

## 2016-08-16 ENCOUNTER — Other Ambulatory Visit: Payer: Self-pay | Admitting: Cardiovascular Disease

## 2016-08-22 ENCOUNTER — Ambulatory Visit (INDEPENDENT_AMBULATORY_CARE_PROVIDER_SITE_OTHER): Payer: Medicare HMO | Admitting: Pharmacist

## 2016-08-22 DIAGNOSIS — Z7901 Long term (current) use of anticoagulants: Secondary | ICD-10-CM

## 2016-08-22 LAB — PROTIME-INR: INR: 2 — AB (ref <=1.1)

## 2016-08-30 ENCOUNTER — Telehealth: Payer: Self-pay | Admitting: Gastroenterology

## 2016-08-30 NOTE — Telephone Encounter (Signed)
Was having regular bowel movements on a daily basis using Amitiza. She had held the Amitiza for a couple of days to prevent issues when she had church and doctor appointments. She resumed the Amitiza after missing 2 days. Unfortunately she did not begin having bowel movements. It has been more than 5 days. The patient has not complained of nausea or abdominal pain. She wants to try Magnesium Citrate as was previously discussed. She may also use a glycerin suppository. She understands if she has an impaction, the magnesium citrate may not eradicate the problem. She will let us know if she acutely worsens or fails to improve.

## 2016-08-30 NOTE — Telephone Encounter (Signed)
Patient's daughter states she is returning phone call to nurse she had just missed a call. Best call back # [phone].

## 2016-08-30 NOTE — Telephone Encounter (Signed)
ok 

## 2016-08-31 ENCOUNTER — Ambulatory Visit: Payer: Medicare HMO | Admitting: Endocrinology

## 2016-09-08 ENCOUNTER — Encounter (HOSPITAL_COMMUNITY): Payer: Self-pay | Admitting: Emergency Medicine

## 2016-09-08 ENCOUNTER — Inpatient Hospital Stay (HOSPITAL_COMMUNITY)
Admission: EM | Admit: 2016-09-08 | Discharge: 2016-09-11 | DRG: 690 | Disposition: A | Discharge status: Home Health | Payer: Medicare HMO | Attending: Internal Medicine | Admitting: Internal Medicine

## 2016-09-08 ENCOUNTER — Inpatient Hospital Stay (HOSPITAL_COMMUNITY): Payer: Medicare HMO

## 2016-09-08 ENCOUNTER — Emergency Department (HOSPITAL_COMMUNITY): Payer: Medicare HMO

## 2016-09-08 ENCOUNTER — Ambulatory Visit: Payer: Medicare HMO | Admitting: Endocrinology

## 2016-09-08 DIAGNOSIS — E7800 Pure hypercholesterolemia, unspecified: Secondary | ICD-10-CM | POA: Diagnosis present

## 2016-09-08 DIAGNOSIS — R7989 Other specified abnormal findings of blood chemistry: Secondary | ICD-10-CM | POA: Diagnosis present

## 2016-09-08 DIAGNOSIS — N3000 Acute cystitis without hematuria: Secondary | ICD-10-CM | POA: Diagnosis present

## 2016-09-08 DIAGNOSIS — R300 Dysuria: Secondary | ICD-10-CM | POA: Diagnosis present

## 2016-09-08 DIAGNOSIS — Z1612 Extended spectrum beta lactamase (ESBL) resistance: Secondary | ICD-10-CM | POA: Diagnosis present

## 2016-09-08 DIAGNOSIS — E119 Type 2 diabetes mellitus without complications: Secondary | ICD-10-CM

## 2016-09-08 DIAGNOSIS — I252 Old myocardial infarction: Secondary | ICD-10-CM | POA: Diagnosis not present

## 2016-09-08 DIAGNOSIS — Z7984 Long term (current) use of oral hypoglycemic drugs: Secondary | ICD-10-CM

## 2016-09-08 DIAGNOSIS — Z7901 Long term (current) use of anticoagulants: Secondary | ICD-10-CM | POA: Diagnosis not present

## 2016-09-08 DIAGNOSIS — I950 Idiopathic hypotension: Secondary | ICD-10-CM

## 2016-09-08 DIAGNOSIS — H548 Legal blindness, as defined in USA: Secondary | ICD-10-CM | POA: Diagnosis present

## 2016-09-08 DIAGNOSIS — Z961 Presence of intraocular lens: Secondary | ICD-10-CM | POA: Diagnosis present

## 2016-09-08 DIAGNOSIS — Z823 Family history of stroke: Secondary | ICD-10-CM

## 2016-09-08 DIAGNOSIS — Z803 Family history of malignant neoplasm of breast: Secondary | ICD-10-CM

## 2016-09-08 DIAGNOSIS — I5042 Chronic combined systolic (congestive) and diastolic (congestive) heart failure: Secondary | ICD-10-CM | POA: Diagnosis present

## 2016-09-08 DIAGNOSIS — Z880 Allergy status to penicillin: Secondary | ICD-10-CM

## 2016-09-08 DIAGNOSIS — E039 Hypothyroidism, unspecified: Secondary | ICD-10-CM | POA: Diagnosis present

## 2016-09-08 DIAGNOSIS — R059 Cough, unspecified: Secondary | ICD-10-CM

## 2016-09-08 DIAGNOSIS — N183 Chronic kidney disease, stage 3 unspecified: Secondary | ICD-10-CM | POA: Diagnosis present

## 2016-09-08 DIAGNOSIS — I480 Paroxysmal atrial fibrillation: Secondary | ICD-10-CM | POA: Diagnosis present

## 2016-09-08 DIAGNOSIS — Z8249 Family history of ischemic heart disease and other diseases of the circulatory system: Secondary | ICD-10-CM

## 2016-09-08 DIAGNOSIS — E1122 Type 2 diabetes mellitus with diabetic chronic kidney disease: Secondary | ICD-10-CM | POA: Diagnosis present

## 2016-09-08 DIAGNOSIS — Z9842 Cataract extraction status, left eye: Secondary | ICD-10-CM | POA: Diagnosis not present

## 2016-09-08 DIAGNOSIS — I25718 Atherosclerosis of autologous vein coronary artery bypass graft(s) with other forms of angina pectoris: Secondary | ICD-10-CM

## 2016-09-08 DIAGNOSIS — E860 Dehydration: Secondary | ICD-10-CM | POA: Diagnosis present

## 2016-09-08 DIAGNOSIS — F0390 Unspecified dementia without behavioral disturbance: Secondary | ICD-10-CM | POA: Diagnosis present

## 2016-09-08 DIAGNOSIS — N179 Acute kidney failure, unspecified: Secondary | ICD-10-CM | POA: Diagnosis present

## 2016-09-08 DIAGNOSIS — R21 Rash and other nonspecific skin eruption: Secondary | ICD-10-CM | POA: Diagnosis not present

## 2016-09-08 DIAGNOSIS — I959 Hypotension, unspecified: Secondary | ICD-10-CM | POA: Diagnosis present

## 2016-09-08 DIAGNOSIS — Z9841 Cataract extraction status, right eye: Secondary | ICD-10-CM | POA: Diagnosis not present

## 2016-09-08 DIAGNOSIS — L270 Generalized skin eruption due to drugs and medicaments taken internally: Secondary | ICD-10-CM | POA: Diagnosis present

## 2016-09-08 DIAGNOSIS — Z8371 Family history of colonic polyps: Secondary | ICD-10-CM

## 2016-09-08 DIAGNOSIS — R778 Other specified abnormalities of plasma proteins: Secondary | ICD-10-CM | POA: Diagnosis present

## 2016-09-08 DIAGNOSIS — K219 Gastro-esophageal reflux disease without esophagitis: Secondary | ICD-10-CM | POA: Diagnosis present

## 2016-09-08 DIAGNOSIS — I2510 Atherosclerotic heart disease of native coronary artery without angina pectoris: Secondary | ICD-10-CM | POA: Diagnosis present

## 2016-09-08 DIAGNOSIS — Z951 Presence of aortocoronary bypass graft: Secondary | ICD-10-CM

## 2016-09-08 DIAGNOSIS — E876 Hypokalemia: Secondary | ICD-10-CM | POA: Diagnosis not present

## 2016-09-08 DIAGNOSIS — E118 Type 2 diabetes mellitus with unspecified complications: Secondary | ICD-10-CM

## 2016-09-08 DIAGNOSIS — D638 Anemia in other chronic diseases classified elsewhere: Secondary | ICD-10-CM | POA: Diagnosis present

## 2016-09-08 DIAGNOSIS — Z86718 Personal history of other venous thrombosis and embolism: Secondary | ICD-10-CM | POA: Diagnosis not present

## 2016-09-08 DIAGNOSIS — R05 Cough: Secondary | ICD-10-CM

## 2016-09-08 DIAGNOSIS — N3001 Acute cystitis with hematuria: Secondary | ICD-10-CM

## 2016-09-08 DIAGNOSIS — I130 Hypertensive heart and chronic kidney disease with heart failure and stage 1 through stage 4 chronic kidney disease, or unspecified chronic kidney disease: Secondary | ICD-10-CM | POA: Diagnosis present

## 2016-09-08 DIAGNOSIS — I482 Chronic atrial fibrillation: Secondary | ICD-10-CM | POA: Diagnosis present

## 2016-09-08 DIAGNOSIS — N390 Urinary tract infection, site not specified: Secondary | ICD-10-CM | POA: Diagnosis present

## 2016-09-08 DIAGNOSIS — K5641 Fecal impaction: Secondary | ICD-10-CM | POA: Diagnosis not present

## 2016-09-08 DIAGNOSIS — R748 Abnormal levels of other serum enzymes: Secondary | ICD-10-CM

## 2016-09-08 DIAGNOSIS — E861 Hypovolemia: Secondary | ICD-10-CM | POA: Diagnosis present

## 2016-09-08 DIAGNOSIS — Z950 Presence of cardiac pacemaker: Secondary | ICD-10-CM | POA: Diagnosis not present

## 2016-09-08 DIAGNOSIS — B9620 Unspecified Escherichia coli [E. coli] as the cause of diseases classified elsewhere: Secondary | ICD-10-CM | POA: Diagnosis present

## 2016-09-08 DIAGNOSIS — Z833 Family history of diabetes mellitus: Secondary | ICD-10-CM

## 2016-09-08 DIAGNOSIS — Z885 Allergy status to narcotic agent status: Secondary | ICD-10-CM

## 2016-09-08 DIAGNOSIS — T7840XA Allergy, unspecified, initial encounter: Secondary | ICD-10-CM

## 2016-09-08 DIAGNOSIS — T378X5A Adverse effect of other specified systemic anti-infectives and antiparasitics, initial encounter: Secondary | ICD-10-CM | POA: Diagnosis present

## 2016-09-08 DIAGNOSIS — E871 Hypo-osmolality and hyponatremia: Secondary | ICD-10-CM | POA: Diagnosis present

## 2016-09-08 LAB — URINALYSIS, ROUTINE W REFLEX MICROSCOPIC
BILIRUBIN URINE: NEGATIVE
GLUCOSE, UA: NEGATIVE mg/dL
Ketones, ur: NEGATIVE mg/dL
NITRITE: NEGATIVE
PH: 5 (ref 5.0–8.0)
Protein, ur: 30 mg/dL — AB
SPECIFIC GRAVITY, URINE: 1.008 (ref 1.005–1.030)

## 2016-09-08 LAB — I-STAT TROPONIN, ED
TROPONIN I, POC: 0.12 ng/mL — AB (ref 0.00–0.08)
TROPONIN I, POC: 0.13 ng/mL — AB (ref 0.00–0.08)

## 2016-09-08 LAB — COMPREHENSIVE METABOLIC PANEL
ALBUMIN: 2.3 g/dL — AB (ref 3.5–5.0)
ALT: 10 U/L — ABNORMAL LOW (ref 14–54)
ANION GAP: 13 (ref 5–15)
AST: 23 U/L (ref 15–41)
Alkaline Phosphatase: 97 U/L (ref 38–126)
BUN: 34 mg/dL — AB (ref 6–20)
CO2: 24 mmol/L (ref 22–32)
Calcium: 8.5 mg/dL — ABNORMAL LOW (ref 8.9–10.3)
Chloride: 94 mmol/L — ABNORMAL LOW (ref 101–111)
Creatinine, Ser: 2.27 mg/dL — ABNORMAL HIGH (ref 0.44–1.00)
GFR calc Af Amer: 20 mL/min — ABNORMAL LOW (ref >60)
GFR, EST NON AFRICAN AMERICAN: 17 mL/min — AB (ref >60)
GLUCOSE: 231 mg/dL — AB (ref 65–99)
POTASSIUM: 3.1 mmol/L — AB (ref 3.5–5.1)
Sodium: 131 mmol/L — ABNORMAL LOW (ref 135–145)
TOTAL PROTEIN: 6.6 g/dL (ref 6.5–8.1)
Total Bilirubin: 0.6 mg/dL (ref 0.3–1.2)

## 2016-09-08 LAB — PROTIME-INR
INR: 5.75 — AB
PROTHROMBIN TIME: 53.5 s — AB (ref 11.4–15.2)

## 2016-09-08 LAB — CBC WITH DIFFERENTIAL/PLATELET
BASOS PCT: 0 %
Basophils Absolute: 0 10*3/uL (ref 0.0–0.1)
EOS ABS: 0.2 10*3/uL (ref 0.0–0.7)
Eosinophils Relative: 2 %
HCT: 34.4 % — ABNORMAL LOW (ref 36.0–46.0)
HEMOGLOBIN: 10.9 g/dL — AB (ref 12.0–15.0)
Lymphocytes Relative: 16 %
Lymphs Abs: 1.7 10*3/uL (ref 0.7–4.0)
MCH: 27.6 pg (ref 26.0–34.0)
MCHC: 31.7 g/dL (ref 30.0–36.0)
MCV: 87.1 fL (ref 78.0–100.0)
MONOS PCT: 5 %
Monocytes Absolute: 0.5 10*3/uL (ref 0.1–1.0)
NEUTROS ABS: 8 10*3/uL — AB (ref 1.7–7.7)
NEUTROS PCT: 77 %
Platelets: 238 10*3/uL (ref 150–400)
RBC: 3.95 MIL/uL (ref 3.87–5.11)
RDW: 15.2 % (ref 11.5–15.5)
WBC: 10.5 10*3/uL (ref 4.0–10.5)

## 2016-09-08 LAB — TSH: TSH: 1.56 u[IU]/mL (ref 0.350–4.500)

## 2016-09-08 LAB — I-STAT CG4 LACTIC ACID, ED
LACTIC ACID, VENOUS: 2.35 mmol/L — AB (ref 0.5–1.9)
LACTIC ACID, VENOUS: 1.42 mmol/L (ref 0.5–1.9)

## 2016-09-08 LAB — BRAIN NATRIURETIC PEPTIDE: B Natriuretic Peptide: 306.1 pg/mL — ABNORMAL HIGH (ref 0.0–100.0)

## 2016-09-08 LAB — TROPONIN I: TROPONIN I: 0.06 ng/mL — AB (ref <0.03)

## 2016-09-08 LAB — GLUCOSE, CAPILLARY: GLUCOSE-CAPILLARY: 230 mg/dL — AB (ref 65–99)

## 2016-09-08 MED ORDER — DOCUSATE SODIUM 100 MG PO CAPS
100.0000 mg | ORAL_CAPSULE | Freq: Every day | ORAL | Status: DC | PRN
  Administered 2016-09-09: 100 mg via ORAL
  Filled 2016-09-08: qty 1

## 2016-09-08 MED ORDER — HEPARIN SODIUM (PORCINE) 5000 UNIT/ML IJ SOLN: 5000.0000 [IU] | Freq: Three times a day (TID) | INTRAMUSCULAR | Status: DC

## 2016-09-08 MED ORDER — ACETAMINOPHEN 650 MG RE SUPP
650.0000 mg | Freq: Four times a day (QID) | RECTAL | Status: DC | PRN
Start: 2016-09-08 — End: 2016-09-11

## 2016-09-08 MED ORDER — POTASSIUM CHLORIDE CRYS ER 10 MEQ PO TBCR
30.0000 meq | EXTENDED_RELEASE_TABLET | Freq: Two times a day (BID) | ORAL | Status: DC
Start: 2016-09-08 — End: 2016-09-11
  Administered 2016-09-08 – 2016-09-11 (×6): 30 meq via ORAL
  Filled 2016-09-08 (×6): qty 1

## 2016-09-08 MED ORDER — DEXTROSE 5 % IV SOLN
500.0000 mg | Freq: Three times a day (TID) | INTRAVENOUS | Status: DC
  Filled 2016-09-08: qty 0.5

## 2016-09-08 MED ORDER — TRAMADOL HCL 50 MG PO TABS
50.0000 mg | ORAL_TABLET | Freq: Two times a day (BID) | ORAL | Status: DC | PRN
  Administered 2016-09-09 – 2016-09-10 (×4): 50 mg via ORAL
  Filled 2016-09-08 (×4): qty 1

## 2016-09-08 MED ORDER — LEVOTHYROXINE SODIUM 125 MCG PO TABS
62.5000 ug | ORAL_TABLET | Freq: Every day | ORAL | Status: DC
  Administered 2016-09-09 – 2016-09-11 (×3): 62.5 ug via ORAL
  Filled 2016-09-08 (×3): qty 1

## 2016-09-08 MED ORDER — SODIUM CHLORIDE 0.9% FLUSH
3.0000 mL | Freq: Two times a day (BID) | INTRAVENOUS | Status: DC
  Administered 2016-09-09 – 2016-09-10 (×2): 3 mL via INTRAVENOUS

## 2016-09-08 MED ORDER — POTASSIUM CHLORIDE 10 MEQ/100ML IV SOLN
10.0000 meq | INTRAVENOUS | Status: AC
  Administered 2016-09-09 (×2): 10 meq via INTRAVENOUS
  Filled 2016-09-08 (×2): qty 100

## 2016-09-08 MED ORDER — DONEPEZIL HCL 5 MG PO TABS
5.0000 mg | ORAL_TABLET | Freq: Every day | ORAL | Status: DC
  Administered 2016-09-08 – 2016-09-10 (×3): 5 mg via ORAL
  Filled 2016-09-08 (×3): qty 1

## 2016-09-08 MED ORDER — DIPHENHYDRAMINE HCL 25 MG PO CAPS: 25.0000 mg | ORAL_CAPSULE | Freq: Four times a day (QID) | ORAL | Status: DC | PRN

## 2016-09-08 MED ORDER — SODIUM CHLORIDE 0.9% FLUSH
3.0000 mL | Freq: Two times a day (BID) | INTRAVENOUS | Status: DC
  Administered 2016-09-11: 3 mL via INTRAVENOUS

## 2016-09-08 MED ORDER — INSULIN ASPART 100 UNIT/ML ~~LOC~~ SOLN
0.0000 [IU] | Freq: Every day | SUBCUTANEOUS | Status: DC
  Administered 2016-09-09: 2 [IU] via SUBCUTANEOUS
  Administered 2016-09-09: 3 [IU] via SUBCUTANEOUS

## 2016-09-08 MED ORDER — LEVALBUTEROL HCL 0.63 MG/3ML IN NEBU: 0.6300 mg | INHALATION_SOLUTION | Freq: Four times a day (QID) | RESPIRATORY_TRACT | Status: DC | PRN

## 2016-09-08 MED ORDER — LORATADINE 10 MG PO TABS
10.0000 mg | ORAL_TABLET | Freq: Every day | ORAL | Status: DC
  Administered 2016-09-09 – 2016-09-11 (×3): 10 mg via ORAL
  Filled 2016-09-08 (×3): qty 1

## 2016-09-08 MED ORDER — GUAIFENESIN 200 MG PO TABS
400.0000 mg | ORAL_TABLET | Freq: Two times a day (BID) | ORAL | Status: DC | PRN
  Administered 2016-09-09 – 2016-09-10 (×2): 400 mg via ORAL
  Filled 2016-09-08 (×3): qty 2

## 2016-09-08 MED ORDER — SODIUM CHLORIDE 0.9 % IV BOLUS (SEPSIS)
1000.0000 mL | Freq: Once | INTRAVENOUS | Status: AC
  Administered 2016-09-08: 1000 mL via INTRAVENOUS

## 2016-09-08 MED ORDER — ROSUVASTATIN CALCIUM 20 MG PO TABS
20.0000 mg | ORAL_TABLET | Freq: Every day | ORAL | Status: DC
  Administered 2016-09-09 – 2016-09-11 (×3): 20 mg via ORAL
  Filled 2016-09-08 (×3): qty 1

## 2016-09-08 MED ORDER — ZOLPIDEM TARTRATE 5 MG PO TABS
5.0000 mg | ORAL_TABLET | Freq: Every evening | ORAL | Status: DC | PRN
  Administered 2016-09-09: 5 mg via ORAL
  Filled 2016-09-08: qty 1

## 2016-09-08 MED ORDER — LUBIPROSTONE 24 MCG PO CAPS
24.0000 ug | ORAL_CAPSULE | Freq: Two times a day (BID) | ORAL | Status: DC
  Administered 2016-09-09 – 2016-09-11 (×6): 24 ug via ORAL
  Filled 2016-09-08 (×6): qty 1

## 2016-09-08 MED ORDER — DIPHENHYDRAMINE HCL 50 MG/ML IJ SOLN
12.5000 mg | Freq: Once | INTRAMUSCULAR | Status: AC
  Administered 2016-09-08: 12.5 mg via INTRAVENOUS
  Filled 2016-09-08: qty 1

## 2016-09-08 MED ORDER — SODIUM CHLORIDE 0.9 % IV SOLN: 250.0000 mL | INTRAVENOUS | Status: DC | PRN

## 2016-09-08 MED ORDER — ONDANSETRON HCL 4 MG PO TABS: 4.0000 mg | ORAL_TABLET | Freq: Four times a day (QID) | ORAL | Status: DC | PRN

## 2016-09-08 MED ORDER — DEXTROSE 5 % IV SOLN: 2.0000 g | Freq: Once | INTRAVENOUS | Status: DC

## 2016-09-08 MED ORDER — POLYVINYL ALCOHOL 1.4 % OP SOLN: 1.0000 [drp] | OPHTHALMIC | Status: DC | PRN

## 2016-09-08 MED ORDER — ACETAMINOPHEN-CODEINE #3 300-30 MG PO TABS: 1.0000 | ORAL_TABLET | Freq: Four times a day (QID) | ORAL | Status: DC | PRN

## 2016-09-08 MED ORDER — SODIUM CHLORIDE 0.9% FLUSH: 3.0000 mL | INTRAVENOUS | Status: DC | PRN

## 2016-09-08 MED ORDER — LEVOFLOXACIN IN D5W 500 MG/100ML IV SOLN: 500.0000 mg | INTRAVENOUS | Status: DC

## 2016-09-08 MED ORDER — PANTOPRAZOLE SODIUM 40 MG PO TBEC
40.0000 mg | DELAYED_RELEASE_TABLET | Freq: Every day | ORAL | Status: DC
  Administered 2016-09-09 – 2016-09-11 (×3): 40 mg via ORAL
  Filled 2016-09-08 (×3): qty 1

## 2016-09-08 MED ORDER — SODIUM CHLORIDE 0.9 % IV SOLN
1.0000 g | Freq: Two times a day (BID) | INTRAVENOUS | Status: DC
  Administered 2016-09-08 – 2016-09-10 (×5): 1 g via INTRAVENOUS
  Filled 2016-09-08 (×7): qty 1

## 2016-09-08 MED ORDER — GABAPENTIN 100 MG PO CAPS: 100.0000 mg | ORAL_CAPSULE | Freq: Two times a day (BID) | ORAL | Status: DC

## 2016-09-08 MED ORDER — MECLIZINE HCL 25 MG PO TABS: 25.0000 mg | ORAL_TABLET | Freq: Three times a day (TID) | ORAL | Status: DC | PRN

## 2016-09-08 MED ORDER — OFLOXACIN 0.3 % OP SOLN
1.0000 [drp] | Freq: Four times a day (QID) | OPHTHALMIC | Status: DC | PRN
  Filled 2016-09-08: qty 5

## 2016-09-08 MED ORDER — ONDANSETRON HCL 4 MG/2ML IJ SOLN: 4.0000 mg | Freq: Four times a day (QID) | INTRAMUSCULAR | Status: DC | PRN

## 2016-09-08 MED ORDER — POTASSIUM CHLORIDE 10 MEQ/100ML IV SOLN: 10.0000 meq | INTRAVENOUS | Status: AC

## 2016-09-08 MED ORDER — INSULIN ASPART 100 UNIT/ML ~~LOC~~ SOLN
0.0000 [IU] | Freq: Three times a day (TID) | SUBCUTANEOUS | Status: DC
  Administered 2016-09-09: 7 [IU] via SUBCUTANEOUS
  Administered 2016-09-09 (×2): 5 [IU] via SUBCUTANEOUS
  Administered 2016-09-11: 2 [IU] via SUBCUTANEOUS

## 2016-09-08 MED ORDER — ACETAMINOPHEN 325 MG PO TABS
650.0000 mg | ORAL_TABLET | Freq: Four times a day (QID) | ORAL | Status: DC | PRN
  Administered 2016-09-10: 650 mg via ORAL
  Filled 2016-09-08: qty 2

## 2016-09-08 MED ORDER — METHYLPREDNISOLONE SODIUM SUCC 125 MG IJ SOLR
125.0000 mg | Freq: Once | INTRAMUSCULAR | Status: AC
  Administered 2016-09-08: 125 mg via INTRAVENOUS
  Filled 2016-09-08: qty 2

## 2016-09-08 MED ORDER — FOLIC ACID 1 MG PO TABS
1.0000 mg | ORAL_TABLET | Freq: Every day | ORAL | Status: DC
  Administered 2016-09-09 – 2016-09-11 (×3): 1 mg via ORAL
  Filled 2016-09-08 (×3): qty 1

## 2016-09-08 MED ORDER — MAGNESIUM SULFATE IN D5W 1-5 GM/100ML-% IV SOLN
1.0000 g | Freq: Once | INTRAVENOUS | Status: AC
  Administered 2016-09-08: 1 g via INTRAVENOUS
  Filled 2016-09-08: qty 100

## 2016-09-08 MED ORDER — CARBOXYMETHYLCELLULOSE SODIUM 1 % OP SOLN: 1.0000 [drp] | OPHTHALMIC | Status: DC | PRN

## 2016-09-08 MED ORDER — LEVOFLOXACIN IN D5W 750 MG/150ML IV SOLN
750.0000 mg | Freq: Once | INTRAVENOUS | Status: AC
  Administered 2016-09-08: 750 mg via INTRAVENOUS
  Filled 2016-09-08: qty 150

## 2016-09-08 MED ORDER — ATROPINE SULFATE 1 % OP SOLN: 1.0000 [drp] | Freq: Every day | OPHTHALMIC | Status: DC | PRN

## 2016-09-08 NOTE — ED Provider Notes (Signed)
[facility] DEPT Provider Note   CSN: [id number] Arrival date & time: 09/08/16  1713     History   Chief Complaint Chief Complaint  Patient presents with  . Hypotension    HPI [name] is a 83 y.o. female.    The history is provided by the patient, a relative and medical records.  Dysuria   This is a new problem. The current episode started more than 2 days ago. The problem occurs every urination. The problem has not changed since onset.The quality of the pain is described as burning. The pain is moderate. There has been no fever. Associated symptoms include chills and frequency. Pertinent negatives include no nausea, no vomiting and no flank pain. She has tried antibiotics for the symptoms.  Rash   This is a new problem. The current episode started 2 days ago. The problem has not changed since onset.The problem is associated with nothing. There has been no fever. The patient is experiencing no pain. She has tried nothing for the symptoms. The treatment provided no relief.    Past Medical History:  Diagnosis Date  . 2nd degree atrioventricular block    a. 06/22/15 Gen change: MDT ADDR01 Adapta, DC PPM (ser# [id number]).  . Allergy to perfume   . Anemia   . Arthritis   . Chronic combined systolic and diastolic CHF (congestive heart failure) ([place])    a. 01/2014 Echo: EF @ least mod-sev reduced with HK of lat/apical, basal inf walls. basalpost AK, Gr 1DD; b. 06/2015 Echo: EF 45-50%, Gr2 DD, mod LVH.   . CKD (chronic kidney disease), stage III    a. iii - iv.  . Coronary artery disease    a. 1994 s/p cabg;  b. 09/2013 MV: large, sev intensity, partially reversible inf, apical defect, prior inf/ap infarct w/ mild peri-infarct ischemia->Med Rx; c. 06/2015 NSTEMI (trop 6)->Med rx.  . Daily headache   . DVT of upper extremity (deep vein thrombosis) ([place]) 06/13/2012   BUE  . Dyspepsia   . Gastric ulcer   . GERD (gastroesophageal reflux disease)   . H/O: GI bleed 12//13  .  Hiatal hernia   . High cholesterol   . History of blood transfusion 2013  . Hypertensive heart disease   . Hypothyroid   . Legally blind   . PAF (paroxysmal atrial fibrillation) ([facility])    a. CHA2DS2VASc = 7-->coumadin.  . Presence of permanent cardiac pacemaker    a. 06/22/15 Gen change: MDT ADDR01 Adapta, DC PPM (ser# [id number]).  . Seasonal allergies   . Type II diabetes mellitus ([place])   . UTI (lower urinary tract infection)     Patient Active Problem List   Diagnosis Date Noted  . CAD (coronary artery disease) 02/29/2016  . Hypercholesterolemia 02/29/2016  . Hypertensive heart disease   . NSTEMI (non-ST elevated myocardial infarction) ([place])   . DNR (do not resuscitate) discussion   . Palliative care encounter   . Weakness   . Elevated troponin I level 06/24/2015  . Septic shock ([place]) 03/30/2015  . Syncope 03/30/2015  . Acute UTI   . Transaminitis   . Chronic combined systolic and diastolic CHF (congestive heart failure) ([place])   . Chronic atrial fibrillation ([place])   . Other specified hypothyroidism   . Pressure ulcer 08/17/2014  . Blood poisoning   . Cancer of central portion of left female breast ([place]) 07/30/2014  . Periumbilical abdominal pain 06/26/2014  . Abnormal TSH 05/28/2014  . CKD (chronic kidney disease), stage  III 05/28/2014  . Acute on chronic combined systolic and diastolic CHF (congestive heart failure) ([place])   . Acute kidney injury ([place])   . SOB (shortness of breath) 05/24/2014  . Abnormal LFTs (liver function tests)   . Epigastric pain   . Pancreatitis 02/04/2014  . Elevated LFTs   . Complete heart block ([place]) 11/19/2013  . Acquired autoimmune hypothyroidism 10/24/2013  . Arm pain, left 08/24/2013  . Abdominal pain, left lower quadrant 06/30/2013  . Dyspnea 02/23/2013  . PAF (paroxysmal atrial fibrillation) ([place]) 11/19/2012  . DVT of upper extremity (deep vein thrombosis) ([place]) 11/18/2012  . Chest pain with low risk of acute coronary syndrome  11/01/2012  . Hypothyroidism 07/21/2012  . Long term current use of anticoagulant therapy 06/20/2012  . History of pulmonary embolism 2014 06/18/2012  . Fever 02/22/2012  . Malnutrition ([place]) 01/01/2012  . Dyspepsia 12/30/2011  . UTI (lower urinary tract infection) 12/29/2011  . Hiatal hernia 12/29/2011  . Gastric ulcer 12/22/2011  . Fatigue 12/21/2011  . Anemia of chronic disease 12/19/2011  . Nausea with vomiting 12/19/2011  . Anorexia 12/19/2011  . Infection, staphylococcal 12/18/2011  . Hypertension 12/14/2011  . Hypotension 12/14/2011  . Pacemaker 12/14/2011  . Hyperkalemia 12/14/2011  . Hyponatremia 12/14/2011  . Microcytic anemia 10/19/2011  . Abnormal weight loss 10/19/2011  . Diabetes mellitus ([place]) 10/19/2011  . Hx of CABG-CABG in 1994. Myoview abnormal Aug 2015- medical Rx 10/19/2011    Past Surgical History:  Procedure Laterality Date  . CARDIAC CATHETERIZATION  10/25/92  . CARDIAC CATHETERIZATION  11/18/03   w/grafts 100%CX LAD 80 & 100%  . CARDIAC CATHETERIZATION  01/24/05   diffuse disease of native vessels  . CARDIAC CATHETERIZATION  06/06/06   severe native CAD  . CARDIOVERSION  06/06/06   successful  . CATARACT EXTRACTION W/ INTRAOCULAR LENS  IMPLANT, BILATERAL Bilateral   . CORONARY ARTERY BYPASS GRAFT  10/27/92   LIMA to LAD,SVG to LAD second diagonal,obtuse maraginal of the CX and posterior descendingbranch of the RCA  . EP IMPLANTABLE DEVICE N/A 06/22/2015   Procedure: PPM/BIV PPM Generator Changeout;  Surgeon: Sanda Klein, MD;  Location: [facility];  Service: Cardiovascular;  Laterality: N/A;  . ESOPHAGOGASTRODUODENOSCOPY  12/22/2011   Procedure: ESOPHAGOGASTRODUODENOSCOPY (EGD);  Surgeon: Gatha Mayer, MD;  Location: [facility];  Service: Endoscopy;  Laterality: N/A;  . INSERT / REPLACE / REMOVE PACEMAKER  06/29/2006   Medtronic adapta  . REFRACTIVE SURGERY Bilateral     OB History    No data available       Home Medications     Prior to Admission medications   Medication Sig Start Date End Date Taking? Authorizing Provider  ACCU-CHEK FASTCLIX LANCETS MISC Use to check sugar 2 times daily 02/25/16   Elayne Snare, MD  acetaminophen (TYLENOL) 500 MG tablet Take 500 mg by mouth every 6 (six) hours as needed (pain).    [provider]  albuterol (PROAIR HFA) 108 (90 Base) MCG/ACT inhaler Inhale 2 puffs into the lungs every 6 (six) hours as needed for wheezing or shortness of breath.    [provider]  AMITIZA 24 MCG capsule Take 1 capsule (24 mcg total) by mouth 2 (two) times daily with a meal. 08/01/16   Nandigam, Venia Minks, MD  atropine 1 % ophthalmic solution Place 1 drop into both eyes daily as needed (burning eyes).  10/29/13   [provider]  Blood Glucose Monitoring Suppl (ACCU-CHEK AVIVA PLUS) w/Device KIT Use to  check sugars 2 times daily. 06/21/16   Reather Littler, MD  cholecalciferol (VITAMIN D) 1000 units tablet Take 1,000 Units by mouth daily. Reported on 07/30/2015    [provider]  docusate sodium (COLACE) 100 MG capsule Take 1 capsule (100 mg total) by mouth 2 (two) times daily. 06/22/16   Croitoru, Mihai, MD  donepezil (ARICEPT) 5 MG tablet Take 5 mg by mouth at bedtime.  07/13/14   [provider]  folic acid (FOLVITE) 1 MG tablet Take 1 tablet (1 mg total) by mouth daily. 03/21/16   Croitoru, Mihai, MD  furosemide (LASIX) 40 MG tablet TAKE 2 TABLETS ( 80 MG ) DAILY 08/16/16   Croitoru, Mihai, MD  gabapentin (NEURONTIN) 100 MG capsule Take 100 mg by mouth 2 (two) times daily.     [provider]  glipiZIDE (GLUCOTROL) 5 MG tablet Take 1/2 pill daily 06/21/16   Reather Littler, MD  glucose blood (ACCU-CHEK AVIVA PLUS) test strip Use as instructed to check sugar 2 times daily. 02/25/16   Reather Littler, MD  guaifenesin (MUCUS RELIEF CHEST CONGESTION) 400 MG TABS tablet Take 400 mg by mouth 2 (two) times daily as needed (cough/ congestion).    [provider]   levothyroxine (SYNTHROID) 125 MCG tablet Take 0.5 tablets (62.5 mcg total) by mouth daily. 12/03/15   Reather Littler, MD  loratadine (CLARITIN) 10 MG tablet Take 10 mg by mouth daily.    [provider]  meclizine (ANTIVERT) 25 MG tablet Take 25 mg by mouth 3 (three) times daily as needed for dizziness.    [provider]  metolazone (ZAROXOLYN) 2.5 MG tablet Take 1 tablet (2.5 mg total) by mouth 2 (two) times a week. Please take on Tuesday and Thursday 06/08/16 09/06/16  Croitoru, Rachelle Hora, MD  nitroGLYCERIN (NITRODUR - DOSED IN MG/24 HR) 0.1 mg/hr patch APPLY 1 PATCH ONTO THE SKIN DAILY 08/16/16   Croitoru, Mihai, MD  ofloxacin (OCUFLOX) 0.3 % ophthalmic solution Place 1 drop into both eyes as needed. 02/17/16   [provider]  omeprazole (PRILOSEC) 40 MG capsule Take 1 capsule (40 mg total) by mouth 2 (two) times daily. 03/09/16   Unk Lightning, PA  OVER THE COUNTER MEDICATION Take 1 Can by mouth daily. Enterex nutritional supplement - with choline    [provider]  OVER THE COUNTER MEDICATION Take 1 tablet by mouth 2 (two) times daily with a meal. FD Guard    [provider]  OVER THE COUNTER MEDICATION Take 1 tablet by mouth daily. Micro-algae DHA    [provider]  OVER THE COUNTER MEDICATION Take 1 capsule by mouth daily. Choline 100 mg, trimethylglycine 50 mg, glutathione 50 mg, methyltetrahydrofolate, l-arginine-, l-ornithine, l-citruline    [provider]  polyethylene glycol (MIRALAX / GLYCOLAX) packet Take 17 g by mouth once a week. 06/22/16   Croitoru, Mihai, MD  Polyvinyl Alcohol-Povidone (REFRESH OP) Place 1 drop into both eyes daily as needed (dry eyes).     [provider]  Potassium Chloride CR (MICRO-K) 8 MEQ CPCR capsule CR Take 1 capsule (8 mEq total) by mouth daily. 06/13/16   Croitoru, Mihai, MD  ranolazine ([name]) 500 MG 12 hr tablet Take 1 tablet (500 mg total) by mouth 2 (two) times daily. 05/12/15    Croitoru, Mihai, MD  rosuvastatin (CRESTOR) 20 MG tablet Take 1 tablet (20 mg total) by mouth daily. 06/21/16   Reather Littler, MD  traMADol (ULTRAM) 50 MG tablet Take 1 tablet (50  mg total) by mouth every 6 (six) hours as needed for moderate pain. Patient taking differently: Take 50 mg by mouth 2 (two) times daily as needed for moderate pain.  08/22/14   Orson Eva, MD  warfarin (COUMADIN) 3 MG tablet Take 1 tablet to 1 and 1/2 tablets every day as directed by coumadin clinic. 06/13/16   Croitoru, Dani Gobble, MD  zolpidem (AMBIEN) 5 MG tablet Take 0.5 tablets (2.5 mg total) by mouth at bedtime. sleep Patient taking differently: Take 5 mg by mouth. sleep 08/22/14   Orson Eva, MD    Family History Family History  Problem Relation Age of Onset  . Heart disease Father   . Diabetes Father   . Breast cancer Daughter   . Diabetes Son   . Diabetes Daughter   . Colon polyps Daughter   . Heart attack Brother   . Diabetes Brother   . Stroke Sister   . Colon cancer Neg Hx     Social History Social History  Substance Use Topics  . Smoking status: Never Smoker  . Smokeless tobacco: Never Used  . Alcohol use No     Allergies   [place]; Digoxin and related; Penicillins; Percocet [oxycodone-acetaminophen]; Percodan [oxycodone-aspirin]; and Vicodin [hydrocodone-acetaminophen]   Review of Systems Review of Systems  Constitutional: Positive for chills and fatigue. Negative for diaphoresis and fever.  HENT: Negative for congestion and rhinorrhea.   Eyes: Negative for visual disturbance.  Respiratory: Positive for cough. Negative for chest tightness, shortness of breath and wheezing.   Cardiovascular: Negative for chest pain, palpitations and leg swelling.  Gastrointestinal: Negative for constipation, diarrhea, nausea and vomiting.  Genitourinary: Positive for dysuria and frequency. Negative for flank pain.  Musculoskeletal: Negative for back pain, neck pain and neck stiffness.  Skin: Positive for rash.   Neurological: Negative for light-headedness, numbness and headaches.  Psychiatric/Behavioral: Negative for agitation.  All other systems reviewed and are negative.    Physical Exam Updated Vital Signs BP (!) 78/60 (BP Location: Left Arm)   Pulse 80   Temp 98.2 F (36.8 C) (Oral)   Resp 16   SpO2 95%   Physical Exam  Constitutional: She is oriented to person, place, and time. She appears well-developed and well-nourished. No distress.  HENT:  Head: Normocephalic.  Mouth/Throat: Oropharynx is clear and moist. No oropharyngeal exudate.  Eyes: Pupils are equal, round, and reactive to light. Conjunctivae and EOM are normal.  Neck: Normal range of motion.  Cardiovascular: Normal rate and intact distal pulses.   No murmur heard. Pulmonary/Chest: Effort normal and breath sounds normal. No stridor. No respiratory distress. She has no wheezes. She exhibits no tenderness.  Abdominal: Soft. Bowel sounds are normal. There is no tenderness.  Musculoskeletal: She exhibits no tenderness.  Neurological: She is alert and oriented to person, place, and time. No sensory deficit. She exhibits normal muscle tone.  Skin: Skin is warm. Capillary refill takes less than 2 seconds. Rash noted. She is not diaphoretic. No erythema.  Psychiatric: She has a normal mood and affect.  Nursing note and vitals reviewed.    ED Treatments / Results  Labs (all labs ordered are listed, but only abnormal results are displayed) Labs Reviewed  COMPREHENSIVE METABOLIC PANEL - Abnormal; Notable for the following:       Result Value   Sodium 131 (*)    Potassium 3.1 (*)    Chloride 94 (*)    Glucose, Bld 231 (*)    BUN 34 (*)    Creatinine,  Ser 2.27 (*)    Calcium 8.5 (*)    Albumin 2.3 (*)    ALT 10 (*)    GFR calc non Af Amer 17 (*)    GFR calc Af Amer 20 (*)    All other components within normal limits  CBC WITH DIFFERENTIAL/PLATELET - Abnormal; Notable for the following:    Hemoglobin 10.9 (*)    HCT  34.4 (*)    Neutro Abs 8.0 (*)    All other components within normal limits  PROTIME-INR - Abnormal; Notable for the following:    Prothrombin Time 53.5 (*)    INR 5.75 (*)    All other components within normal limits  URINALYSIS, ROUTINE W REFLEX MICROSCOPIC - Abnormal; Notable for the following:    Color, Urine AMBER (*)    APPearance TURBID (*)    Hgb urine dipstick MODERATE (*)    Protein, ur 30 (*)    Leukocytes, UA LARGE (*)    Bacteria, UA MANY (*)    Squamous Epithelial / LPF 0-5 (*)    All other components within normal limits  BRAIN NATRIURETIC PEPTIDE - Abnormal; Notable for the following:    B Natriuretic Peptide 306.1 (*)    All other components within normal limits  TROPONIN I - Abnormal; Notable for the following:    Troponin I 0.06 (*)    All other components within normal limits  GLUCOSE, CAPILLARY - Abnormal; Notable for the following:    Glucose-Capillary 230 (*)    All other components within normal limits  I-STAT CG4 LACTIC ACID, ED - Abnormal; Notable for the following:    Lactic Acid, Venous 2.35 (*)    All other components within normal limits  I-STAT TROPONIN, ED - Abnormal; Notable for the following:    Troponin i, poc 0.12 (*)    All other components within normal limits  I-STAT TROPONIN, ED - Abnormal; Notable for the following:    Troponin i, poc 0.13 (*)    All other components within normal limits  CULTURE, BLOOD (ROUTINE X 2)  CULTURE, BLOOD (ROUTINE X 2)  URINE CULTURE  TSH  BASIC METABOLIC PANEL  CBC WITH DIFFERENTIAL/PLATELET  TROPONIN I  TROPONIN I  PROTIME-INR  I-STAT CG4 LACTIC ACID, ED    EKG  EKG Interpretation None       Radiology Dg Chest Portable 1 View  Result Date: 09/08/2016 CLINICAL DATA:  Shortness of breath. Allergic reaction to antibiotic. Hypotension. EXAM: PORTABLE CHEST 1 VIEW COMPARISON:  One-view chest x-ray 06/27/2015 FINDINGS: The heart is enlarged. Pulmonary vascular congestion is not significantly  changed. Aeration is slightly improved. There is no focal airspace disease. CABG is noted. Pacing wires stable. Left neck surgical clips are stable. IMPRESSION: 1. Stable cardiomegaly and pulmonary vascular congestion without frank edema or focal airspace disease. Electronically Signed   By: San Morelle M.D.   On: 09/08/2016 19:21    Procedures Procedures (including critical care time)  CRITICAL CARE Performed by: Gwenyth Allegra Ash[place] Wiseman Total critical care time: 35 minutes Critical care time was exclusive of separately billable procedures and treating other patients. Hypotension with infection and allergic reaction. Critical care was necessary to treat or prevent imminent or life-threatening deterioration. Critical care was time spent personally by me on the following activities: development of treatment plan with patient and/or surrogate as well as nursing, discussions with consultants, evaluation of patient's response to treatment, examination of patient, obtaining history from patient or surrogate, ordering and performing treatments and interventions,  ordering and review of laboratory studies, ordering and review of radiographic studies, pulse oximetry and re-evaluation of patient's condition.   Medications Ordered in ED Medications  acetaminophen-codeine (TYLENOL #3) 300-30 MG per tablet 1 tablet (not administered)  levalbuterol (XOPENEX) nebulizer solution 0.63 mg (not administered)  zolpidem (AMBIEN) tablet 5 mg (not administered)  lubiprostone (AMITIZA) capsule 24 mcg (not administered)  docusate sodium (COLACE) capsule 100 mg (not administered)  rosuvastatin (CRESTOR) tablet 20 mg (not administered)  potassium chloride (K-DUR,KLOR-CON) CR tablet 30 mEq (not administered)  folic acid (FOLVITE) tablet 1 mg (not administered)  pantoprazole (PROTONIX) EC tablet 40 mg (not administered)  ofloxacin (OCUFLOX) 0.3 % ophthalmic solution 1 drop (not administered)  levothyroxine  (SYNTHROID, LEVOTHROID) tablet 62.5 mcg (not administered)  guaiFENesin tablet 400 mg (not administered)  traMADol (ULTRAM) tablet 50 mg (not administered)  donepezil (ARICEPT) tablet 5 mg (not administered)  meclizine (ANTIVERT) tablet 25 mg (not administered)  atropine 1 % ophthalmic solution 1 drop (not administered)  loratadine (CLARITIN) tablet 10 mg (not administered)  gabapentin (NEURONTIN) capsule 100 mg (not administered)  sodium chloride flush (NS) 0.9 % injection 3 mL (not administered)  sodium chloride flush (NS) 0.9 % injection 3 mL (not administered)  sodium chloride flush (NS) 0.9 % injection 3 mL (not administered)  0.9 %  sodium chloride infusion (not administered)  acetaminophen (TYLENOL) tablet 650 mg (not administered)    Or  acetaminophen (TYLENOL) suppository 650 mg (not administered)  ondansetron (ZOFRAN) tablet 4 mg (not administered)    Or  ondansetron (ZOFRAN) injection 4 mg (not administered)  insulin aspart (novoLOG) injection 0-9 Units (not administered)  insulin aspart (novoLOG) injection 0-5 Units (not administered)  diphenhydrAMINE (BENADRYL) capsule 25 mg (not administered)  potassium chloride 10 mEq in 100 mL IVPB (not administered)  meropenem (MERREM) 1 g in sodium chloride 0.9 % 100 mL IVPB (0 g Intravenous Stopped 09/08/16 2135)  polyvinyl alcohol (LIQUIFILM TEARS) 1.4 % ophthalmic solution 1 drop (not administered)  diphenhydrAMINE (BENADRYL) injection 12.5 mg (12.5 mg Intravenous Given 09/08/16 1814)  methylPREDNISolone sodium succinate (SOLU-MEDROL) 125 mg/2 mL injection 125 mg (125 mg Intravenous Given 09/08/16 1814)  sodium chloride 0.9 % bolus 1,000 mL (0 mLs Intravenous Stopped 09/08/16 1934)  levofloxacin (LEVAQUIN) IVPB 750 mg (0 mg Intravenous Stopped 09/08/16 2157)  magnesium sulfate IVPB 1 g 100 mL (0 g Intravenous Stopped 09/08/16 2232)     Initial Impression / Assessment and Plan / ED Course  I have reviewed the triage vital signs and the  nursing notes.  Pertinent labs & imaging results that were available during my care of the patient were reviewed by me and considered in my medical decision making (see chart for details).     [name] is a 83 y.o. female with a past medical history significant for CAD status post CABG, pacemaker, atrial fibrillation on Coumadin, diabetes, hypertension, CHF, hypothyroidism, prior pancreatitis, and CT/PE who presents with fatigue, somnolence, low blood pressure, dysuria, chills, cough, and rash. Patient is a come in by family reports that patient was diagnosed with urinary tract infection last week. Patient was given nitrofurantoin and took 3 days of the medications. She says that she stopped the nitrofurantoin yesterday when she began having a rash over the last 2 days. She reports that she has had continued dry cough and chills but no fevers. She denies any chest pain or abdominal pain. She denies any nausea, vomiting, constipation, diarrhea. His report continued dysuria despite antibiotics.  In triage,  patient's blood pressure was in the 02V systolic. Patient quickly brought back to the exam room for evaluation. After laying flat, blood pressure improved into the low 100s. Patient was quickly assessed. Airway intact. Lungs clear bilaterally with no wheezing. No stridor. No evidence of oral or mouth swelling. Blood pressure slightly improved.  Patient found to have diffuse rash all over body. Slight appearance of urticaria. Nontender. Nonpruritic. No focal neurologic deficits. Lungs clear. No significant lower extremity edema. Abdomen nontender.  Based on patient's symptoms, multiple etiologies exist for hypotension including sepsis from known urinary tract infection with continued chills and dysuria as well as allergic reaction from the [facility] with the rash. In the absence of stridor, oral swelling, and improving blood pressure, do not feel patient needs epinephrine at this time. Patient  will be given some fluids as she did not have significant crackles in her lungs. Patient will have workup to look for sepsis in either urine or her lungs given the cough. Patient will be given steroids and Benadryl for the allergic reaction.  Anticipate reassessment for disposition determination.        Patient's diagnostic testing results are seen above. Patient found to have elevated troponin that was downtrending. Lactic acid elevated. Fluids were given and this improved. Urinalysis confirmed urinary tract infection. Patient found to be supratherapeutic with her INR 5.75. BNP elevated at 306. TSH normal. Chest x-ray shows no pneumonia. Patient found to have acute kidney injury as well.  Given patient's soft blood pressures suspect there is a combination of allergic reaction and urosepsis causing symptoms. Patient given antibiotics and cultures obtained.Given lack of respiratory complaint or oral swelling, epinephrine was not ordered. Patient had improvement in the rash with the Benadryl and steroids. Next  Patient admitted to hospitalist service for further management of UTI and AKI.     Final Clinical Impressions(s) / ED Diagnoses   Final diagnoses:  AKI (acute kidney injury) ([place])  Acute cystitis without hematuria  Allergic reaction, initial encounter     Clinical Impression: 1. Acute cystitis without hematuria   2. AKI (acute kidney injury) ([place])   3. Allergic reaction, initial encounter     Disposition: Admit to Hospitalist service    Angelle Isais, Gwenyth Allegra, MD 09/08/16 [phone]

## 2016-09-08 NOTE — H&P (Signed)
History and Physical    [name] GHW:[id number] DOB: 02-19-1920 DOA: 09/08/2016  PCP: [name], MD   Patient coming from: Home  Chief Complaint: Somnolence, UTI with possible allergy to abx   HPI: [name] is a 83 y.o. female with medical history significant for atrial fibrillation on warfarin, coronary artery disease, chronic combined systolic/diastolic CHF, diabetes mellitus, hypothyroidism, and dementia, now presenting to the emergency department at the direction of her PCP for evaluation of hypotension and somnolence. Patient was reportedly started on nitrofurantoin recently for suspected UTI, but developed a diffuse rash for which she followed up with her PCP today. She was noted to be somnolent in the clinic and hypotensive, and she was directed to the ED for further evaluation. There has not been any anginal complaints and she denies fevers or chills. No facial or oral swelling and no dyspnea, wheezing, or stridor. She has history of ESBL Escherichia coli in her urine.  ED Course: Upon arrival to the ED, patient is found to be afebrile, saturating well on room air, with blood pressure 78/60, and vitals otherwise stable. Chest x-ray is notable for stable cardiomegaly and pulmonary vascular congestion, but without frank edema or focal airspace disease. Chemistry panel reveals a sodium of 131, potassium 3.1, and creatinine of 2.27, up from 1.34 in June 2018. CBC is notable for a stable chronic normocytic anemia with hemoglobin of 10.9. TSH is within normal limits. Lactic acid is mildly elevated to 2.35, INR is supratherapeutic at 5.75, BNP is elevated to 306, and troponin is elevated to 0.12. Urinalysis suggests ongoing infection. Blood cultures were obtained and the patient was treated with a liter of normal saline, Benadryl, IV Solu-Medrol, Levaquin, and Azactam. Blood pressure improved and remained stable and the patient has not been in any apparent respiratory distress.  She will be admitted to the telemetry unit for ongoing evaluation and management of hypotension and acute kidney injury suspected secondary to UTI.  Review of Systems:  All other systems reviewed and apart from HPI, are negative.  Past Medical History:  Diagnosis Date  . 2nd degree atrioventricular block    a. 06/22/15 Gen change: MDT ADDR01 Adapta, DC PPM (ser# [id number]).  . Allergy to perfume   . Anemia   . Arthritis   . Chronic combined systolic and diastolic CHF (congestive heart failure) ([place])    a. 01/2014 Echo: EF @ least mod-sev reduced with HK of lat/apical, basal inf walls. basalpost AK, Gr 1DD; b. 06/2015 Echo: EF 45-50%, Gr2 DD, mod LVH.   . CKD (chronic kidney disease), stage III    a. iii - iv.  . Coronary artery disease    a. 1994 s/p cabg;  b. 09/2013 MV: large, sev intensity, partially reversible inf, apical defect, prior inf/ap infarct w/ mild peri-infarct ischemia->Med Rx; c. 06/2015 NSTEMI (trop 6)->Med rx.  . Daily headache   . DVT of upper extremity (deep vein thrombosis) ([place]) 06/13/2012   BUE  . Dyspepsia   . Gastric ulcer   . GERD (gastroesophageal reflux disease)   . H/O: GI bleed 12//13  . Hiatal hernia   . High cholesterol   . History of blood transfusion 2013  . Hypertensive heart disease   . Hypothyroid   . Legally blind   . PAF (paroxysmal atrial fibrillation) ([facility])    a. CHA2DS2VASc = 7-->coumadin.  . Presence of permanent cardiac pacemaker    a. 06/22/15 Gen change: MDT ADDR01 Adapta, DC PPM (ser# [id number]).  Marland Kitchen  Seasonal allergies   . Type II diabetes mellitus ([place])   . UTI (lower urinary tract infection)     Past Surgical History:  Procedure Laterality Date  . CARDIAC CATHETERIZATION  10/25/92  . CARDIAC CATHETERIZATION  11/18/03   w/grafts 100%CX LAD 80 & 100%  . CARDIAC CATHETERIZATION  01/24/05   diffuse disease of native vessels  . CARDIAC CATHETERIZATION  06/06/06   severe native CAD  . CARDIOVERSION  06/06/06   successful  . CATARACT  EXTRACTION W/ INTRAOCULAR LENS  IMPLANT, BILATERAL Bilateral   . CORONARY ARTERY BYPASS GRAFT  10/27/92   LIMA to LAD,SVG to LAD second diagonal,obtuse maraginal of the CX and posterior descendingbranch of the RCA  . EP IMPLANTABLE DEVICE N/A 06/22/2015   Procedure: PPM/BIV PPM Generator Changeout;  Surgeon: Sanda Klein, MD;  Location: [facility];  Service: Cardiovascular;  Laterality: N/A;  . ESOPHAGOGASTRODUODENOSCOPY  12/22/2011   Procedure: ESOPHAGOGASTRODUODENOSCOPY (EGD);  Surgeon: Gatha Mayer, MD;  Location: [facility];  Service: Endoscopy;  Laterality: N/A;  . INSERT / REPLACE / REMOVE PACEMAKER  06/29/2006   Medtronic adapta  . REFRACTIVE SURGERY Bilateral      reports that she has never smoked. She has never used smokeless tobacco. She reports that she does not drink alcohol or use drugs.  Allergies  Allergen Reactions  . Macrobid [Nitrofurantoin] Rash and Cough    Wheezing (also) THIS REACTION RESULTED IN THE PATIENT ENDING UP IN THE E.D.  . Darvon Other (See Comments)    Causes confusion  . Digoxin And Related Nausea Only  . Penicillins Other (See Comments)    From childhood: Has patient had a PCN reaction causing immediate rash, facial/tongue/throat swelling, SOB or lightheadedness with hypotension: Unknown Has patient had a PCN reaction causing severe rash involving mucus membranes or skin necrosis: Unknown Has patient had a PCN reaction that required hospitalization: Unknown Has patient had a PCN reaction occurring within the last 10 years: No If all of the above answers are "NO", then may proceed with Cephalosporin use.   Marland Kitchen Percocet [Oxycodone-Acetaminophen] Other (See Comments)    Causes confusion  . Percodan [Oxycodone-Aspirin] Other (See Comments)    Causes confusion  . Vicodin [Hydrocodone-Acetaminophen] Other (See Comments)    Causes confusion    Family History  Problem Relation Age of Onset  . Heart disease Father   . Diabetes Father   .  Breast cancer Daughter   . Diabetes Son   . Diabetes Daughter   . Colon polyps Daughter   . Heart attack Brother   . Diabetes Brother   . Stroke Sister   . Colon cancer Neg Hx      Prior to Admission medications   Medication Sig Start Date End Date Taking? Authorizing Provider  acetaminophen (TYLENOL) 500 MG tablet Take 500 mg by mouth every 6 (six) hours as needed (pain).   Yes [provider]  acetaminophen-codeine (TYLENOL #3) 300-30 MG tablet Take 1 tablet by mouth every 6 (six) hours as needed for severe pain. 07/04/16  Yes [provider]  albuterol (PROAIR HFA) 108 (90 Base) MCG/ACT inhaler Inhale 2 puffs into the lungs every 6 (six) hours as needed for wheezing or shortness of breath.   Yes [provider]  AMITIZA 24 MCG capsule Take 1 capsule (24 mcg total) by mouth 2 (two) times daily with a meal. 08/01/16  Yes Nandigam, Kavitha V, MD  ARGININE PO Take 1 capsule by mouth daily.   Yes [provider]  atropine 1 % ophthalmic solution Place 1 drop into both eyes daily as needed (burning eyes).  10/29/13  Yes [provider]  docusate sodium (COLACE) 100 MG capsule Take 1 capsule (100 mg total) by mouth 2 (two) times daily. Patient taking differently: Take 100 mg by mouth daily as needed for mild constipation.  06/22/16  Yes Croitoru, Mihai, MD  donepezil (ARICEPT) 5 MG tablet Take 5 mg by mouth at bedtime.  07/13/14  Yes [provider]  folic acid (FOLVITE) 1 MG tablet Take 1 tablet (1 mg total) by mouth daily. 03/21/16  Yes Croitoru, Mihai, MD  furosemide (LASIX) 40 MG tablet TAKE 2 TABLETS ( 80 MG ) DAILY Patient taking differently: Take 40 mg by mouth in the morning and may take an additional 40 mg if weight increases to 170 pounds overnight 08/16/16  Yes Croitoru, Mihai, MD  gabapentin (NEURONTIN) 100 MG capsule Take 100 mg by mouth 2 (two) times daily.    Yes [provider]  glipiZIDE (GLUCOTROL) 5 MG tablet Take 1/2 pill  daily Patient taking differently: Take 2.5 mg by mouth daily. Take 1/2 pill daily 06/21/16  Yes Elayne Snare, MD  guaifenesin (MUCUS RELIEF CHEST CONGESTION) 400 MG TABS tablet Take 400 mg by mouth 2 (two) times daily as needed (cough/ congestion).   Yes [provider]  levalbuterol Penne Lash) 0.63 MG/3ML nebulizer solution Take 3 mLs by nebulization 3 (three) times daily as needed for wheezing or shortness of breath.  09/06/16  Yes [provider]  levothyroxine (SYNTHROID) 125 MCG tablet Take 0.5 tablets (62.5 mcg total) by mouth daily. 12/03/15  Yes Elayne Snare, MD  loratadine (CLARITIN) 10 MG tablet Take 10 mg by mouth daily.   Yes [provider]  magnesium citrate SOLN Take 1 Bottle by mouth every 7 (seven) days.   Yes [provider]  meclizine (ANTIVERT) 25 MG tablet Take 25 mg by mouth 3 (three) times daily as needed for dizziness.   Yes [provider]  metolazone (ZAROXOLYN) 2.5 MG tablet Take 1 tablet (2.5 mg total) by mouth 2 (two) times a week. Please take on Tuesday and Thursday Patient taking differently: Take 2.5 mg by mouth 2 (two) times a week. TUESDAYS AND THURSDAYS 06/08/16 09/08/16 Yes Croitoru, Mihai, MD  nitroGLYCERIN (NITRODUR - DOSED IN MG/24 HR) 0.1 mg/hr patch APPLY 1 PATCH ONTO THE SKIN DAILY 08/16/16  Yes Croitoru, Mihai, MD  ofloxacin (OCUFLOX) 0.3 % ophthalmic solution Place 1 drop into both eyes 4 (four) times daily as needed (for drainage).  02/17/16  Yes [provider]  omeprazole (PRILOSEC) 40 MG capsule Take 1 capsule (40 mg total) by mouth 2 (two) times daily. 03/09/16  Yes Levin Erp, PA  OVER THE COUNTER MEDICATION Take 1 Can by mouth daily. Enterex nutritional supplement - with choline   Yes [provider]  OVER THE COUNTER MEDICATION Kidney supplement capsules (Choline 100 mg, trimethylglycine 50 mg, glutathione 50 mg, methyltetrahydrofolate, l-arginine-, l-ornithine, l-citruline): Take 1 capsule  by mouth two to three times a week   Yes [provider]  Polyvinyl Alcohol-Povidone (REFRESH OP) Place 1 drop into both eyes daily as needed (dry eyes).    Yes [provider]  Potassium Chloride CR (MICRO-K) 8 MEQ CPCR capsule CR Take 1 capsule (8 mEq total) by mouth daily. 06/13/16  Yes Croitoru, Mihai, MD  rosuvastatin (CRESTOR) 20 MG tablet Take 1 tablet (20 mg total) by mouth daily. 06/21/16  Yes  Elayne Snare, MD  traMADol (ULTRAM) 50 MG tablet Take 1 tablet (50 mg total) by mouth every 6 (six) hours as needed for moderate pain. Patient taking differently: Take 50 mg by mouth 2 (two) times daily as needed for moderate pain.  08/22/14  Yes Tat, Shanon Brow, MD  warfarin (COUMADIN) 3 MG tablet Take 1 tablet to 1 and 1/2 tablets every day as directed by coumadin clinic. Patient taking differently: Take 3-4.5 mg by mouth See admin instructions. 4.5 mg at bedtime on Sun/Tues/Wed/Thurs/Sat and 3 mg on Mon/Fri 06/13/16  Yes Croitoru, Mihai, MD  zolpidem (AMBIEN) 10 MG tablet Take 5-10 mg by mouth at bedtime as needed for sleep.  08/08/16  Yes [provider]  ACCU-CHEK FASTCLIX LANCETS MISC Use to check sugar 2 times daily 02/25/16   Elayne Snare, MD  Blood Glucose Monitoring Suppl (ACCU-CHEK AVIVA PLUS) w/Device KIT Use to check sugars 2 times daily. 06/21/16   Elayne Snare, MD  glucose blood (ACCU-CHEK AVIVA PLUS) test strip Use as instructed to check sugar 2 times daily. 02/25/16   Elayne Snare, MD  polyethylene glycol [facility] / Floria Raveling) packet Take 17 g by mouth once a week. Patient not taking: Reported on 09/08/2016 06/22/16   Croitoru, Dani Gobble, MD  ranolazine ([name]) 500 MG 12 hr tablet Take 1 tablet (500 mg total) by mouth 2 (two) times daily. Patient not taking: Reported on 09/08/2016 05/12/15   Croitoru, Mihai, MD  zolpidem (AMBIEN) 5 MG tablet Take 0.5 tablets (2.5 mg total) by mouth at bedtime. sleep Patient not taking: Reported on 09/08/2016 08/22/14   Orson Eva, MD    Physical  Exam: Vitals:   09/08/16 1859 09/08/16 1900 09/08/16 1930 09/08/16 2000  BP:  (!) 112/58 107/61 116/66  Pulse:  83 83 82  Resp:  _0 Temp: 98.5 F (36.9 C)     TempSrc: Rectal     SpO2:  97% (!) 86% 96%      Constitutional: lethargic, roused with gentle touch, no pallor, no diaphoresis Eyes: PERTLA, lids and conjunctivae normal ENMT: Mucous membranes are dry. Posterior pharynx clear of any exudate or lesions.   Neck: normal, supple, no masses, no thyromegaly Respiratory: Slightly diminished breath sounds bilaterally with occasional expiratory wheeze. Normal respiratory effort. No accessory muscle use.  Cardiovascular: S1 & S2 heard, regular rate and rhythm. Trace pretibial edema bilaterally. No significant JVD. Abdomen: No distension, no tenderness, no masses palpated. Bowel sounds active.  Musculoskeletal: no clubbing / cyanosis. No joint deformity upper and lower extremities.    Skin: Confluent erythematous macules over the abdomen and thighs without drainage or oral involvement. Otherwise warm, dry, well-perfused. Neurologic: No gross facial asymmetry. Patellar DTR's normal. Moving all extremities spontaneously.  Psychiatric: Unable to assess given the clinical scenario.     Labs on Admission: I have personally reviewed following labs and imaging studies  CBC:  Recent Labs Lab 09/08/16 1751  WBC 10.5  NEUTROABS 8.0*  HGB 10.9*  HCT 34.4*  MCV 87.1  PLT 920   Basic Metabolic Panel:  Recent Labs Lab 09/08/16 1751  NA 131*  K 3.1*  CL 94*  CO2 24  GLUCOSE 231*  BUN 34*  CREATININE 2.27*  CALCIUM 8.5*   GFR: CrCl cannot be calculated (Unknown ideal weight.). Liver Function Tests:  Recent Labs Lab 09/08/16 1751  AST 23  ALT 10*  ALKPHOS 97  BILITOT 0.6  PROT 6.6  ALBUMIN 2.3*   No results for input(s): LIPASE, AMYLASE in the  last 168 hours. No results for input(s): AMMONIA in the last 168 hours. Coagulation Profile:  Recent Labs Lab  09/08/16 1751  INR 5.75*   Cardiac Enzymes: No results for input(s): CKTOTAL, CKMB, CKMBINDEX, TROPONINI in the last 168 hours. BNP (last 3 results) No results for input(s): PROBNP in the last 8760 hours. HbA1C: No results for input(s): HGBA1C in the last 72 hours. CBG: No results for input(s): GLUCAP in the last 168 hours. Lipid Profile: No results for input(s): CHOL, HDL, LDLCALC, TRIG, CHOLHDL, LDLDIRECT in the last 72 hours. Thyroid Function Tests:  Recent Labs  09/08/16 1751  TSH 1.560   Anemia Panel: No results for input(s): VITAMINB12, FOLATE, FERRITIN, TIBC, IRON, RETICCTPCT in the last 72 hours. Urine analysis:    Component Value Date/Time   COLORURINE AMBER (A) 09/08/2016 1900   APPEARANCEUR TURBID (A) 09/08/2016 1900   LABSPEC 1.008 09/08/2016 1900   PHURINE 5.0 09/08/2016 1900   GLUCOSEU NEGATIVE 09/08/2016 1900   GLUCOSEU NEGATIVE 04/01/2013 1550   HGBUR MODERATE (A) 09/08/2016 1900   BILIRUBINUR NEGATIVE 09/08/2016 1900   KETONESUR NEGATIVE 09/08/2016 1900   PROTEINUR 30 (A) 09/08/2016 1900   UROBILINOGEN 1.0 08/16/2014 1521   NITRITE NEGATIVE 09/08/2016 1900   LEUKOCYTESUR LARGE (A) 09/08/2016 1900   Sepsis Labs: _0 (procalcitonin:4,lacticidven:4) )No results found for this or any previous visit (from the past 240 hour(s)).   Radiological Exams on Admission: Dg Chest Portable 1 View  Result Date: 09/08/2016 CLINICAL DATA:  Shortness of breath. Allergic reaction to antibiotic. Hypotension. EXAM: PORTABLE CHEST 1 VIEW COMPARISON:  One-view chest x-ray 06/27/2015 FINDINGS: The heart is enlarged. Pulmonary vascular congestion is not significantly changed. Aeration is slightly improved. There is no focal airspace disease. CABG is noted. Pacing wires stable. Left neck surgical clips are stable. IMPRESSION: 1. Stable cardiomegaly and pulmonary vascular congestion without frank edema or focal airspace disease. Electronically Signed   By: San Morelle M.D.   On: 09/08/2016 19:21    EKG: Independently reviewed. Sinus or ectopic rhythm with non-specific IVCD.   Assessment/Plan  1. UTI  - Pt presents with ongoing dysuria and new rash after starting nitrofurantoin 5 days ago  - She was seen first at PCP office, but was lethargic and hypotensive and directed to the ED  - UA suggests ongoing infection and sample sent for culture  - She has hx of ESBL E coli and will be treated with meropenem pending cultures   2. Hypotension  - Initial BP 78/60, improved and stable after 1 liter NS in ED  - She wears a nitroglycerin patch daily which was removed in ED  - Hold diuretic, give additional IVF prn recurrent hypotension    3. Acute kidney injury superimposed on CKD stage III  - SCr is 2.27 on admission, up from 1.34 in June 2018  - Likely prerenal in setting of hypotension  - Treated in ED with 1 liter NS  - Plan to check renal US, hold Lasix, repeat chem panel in am   4. CAD, elevated troponin - No anginal complaints  - Initial troponin elevated to 0.12   - NSTEMI last year with troponin in 6-range, family declined cath  - Continue Crestor and warfarin, resume nitrates as BP allows    5. Hypokalemia - Serum potassium is 3.1 on admission  - Increased her daily supplement to 30 mEq BID and given 20 mEq IV potassium and 1 g IV magnesium  - Continue cardiac monitoring and repeat chemistries in am  6. Type II DM  - A1c was 6.3% in February 2018  - Managed at home with glipizide   - Check CBG with meals and qHS  - Start a low-intensity SSI with Novolog   7. Hypothyroidism  - TSH wnl in ED  - Continue Synthroid    8. Paroxysmal atrial fibrillation, supratherapeutic INR - CHADS-VASc is 4 (age x2, gender, CVA x2, CHF, DM)  - INR is 5.75 on admission without bleeding; plan to hold warfarin and repeat INR in am  - Has not require rate-control agents  9. Anemia of chronic disease  - Hgb is stable at 10.9 and there is no  bleeding evident   10. Chronic combined systolic/diastolic CHF  - Pt appears hypovolemic on admission and was treated with 1 liter NS in ED  - Managed at home with Lasix 80 mg qD, held on admission  - SLIV, follow daily wts and I/O's, resume diuretic as indicated     DVT prophylaxis: warfarin Code Status: Partial, no intubation  Family Communication: Daughter and grandson updated at bedside.  Disposition Plan: Admit to telemetry Consults called: None Admission status: Inpatient    Vianne Bulls, MD [facility] Pager [phone]  If 7PM-7AM, please contact night-coverage [email or web address] Password [facility]  09/08/2016, 8:33 PM

## 2016-09-08 NOTE — ED Triage Notes (Signed)
Pt presents to ED from PCP where she was seen for an allergic reaction to the antibiotic she was taking for a UTI.   Pt more somnolent than normal, and BP 78/60 at triage.  No fever.

## 2016-09-08 NOTE — ED Notes (Signed)
Nurse will draw blood for troponin.

## 2016-09-08 NOTE — ED Notes (Signed)
Dr Tegeler aware of lactic

## 2016-09-08 NOTE — ED Notes (Signed)
Hospitalist at bedside 

## 2016-09-08 NOTE — Progress Notes (Addendum)
Pharmacy Antibiotic Note  [name] is a 83 y.o. female admitted on 09/08/2016 with UTI. She was recently discharged with Macrobid but had an allergic reaction. Pharmacy has been consulted for Aztreonam and Levaquin dosing. Patient is currently in acute renal failure with SCr 2.27 (CrCl ~ 16 mL/min). WBC wnl. Afebrile.   Plan: -Aztreonam 500 mg IV Q 8 hours -Levaquin 500 mg IV Q 48 hours -Monitor CBC, renal fx, cultures and clinical progress     Temp (24hrs), Avg:98.4 F (36.9 C), Min:98.2 F (36.8 C), Max:98.5 F (36.9 C)   Recent Labs Lab 09/08/16 1751 09/08/16 1811  WBC 10.5  --   CREATININE 2.27*  --   LATICACIDVEN  --  2.35*    CrCl cannot be calculated (Unknown ideal weight.).    Allergies  Allergen Reactions  . Macrobid [Nitrofurantoin] Rash and Cough    Wheezing (also) THIS REACTION RESULTED IN THE PATIENT ENDING UP IN THE E.D.  . Darvon Other (See Comments)    Causes confusion  . Digoxin And Related Nausea Only  . Penicillins Other (See Comments)    From childhood: Has patient had a PCN reaction causing immediate rash, facial/tongue/throat swelling, SOB or lightheadedness with hypotension: Unknown Has patient had a PCN reaction causing severe rash involving mucus membranes or skin necrosis: Unknown Has patient had a PCN reaction that required hospitalization: Unknown Has patient had a PCN reaction occurring within the last 10 years: No If all of the above answers are "NO", then may proceed with Cephalosporin use.   Marland Kitchen Percocet [Oxycodone-Acetaminophen] Other (See Comments)    Causes confusion  . Percodan [Oxycodone-Aspirin] Other (See Comments)    Causes confusion  . Vicodin [Hydrocodone-Acetaminophen] Other (See Comments)    Causes confusion    Antimicrobials this admission: Aztreonam 8/24 >>  Levaquin 8/24 >>   Dose adjustments this admission: None  Microbiology results:  Thank you for allowing pharmacy to be a part of this patient's  care.  Albertina Parr, PharmD., BCPS Clinical Pharmacist Pager [phone]  Addendum: Now switching antibiotics to meropenem due to h/o of ESBL UTI. Pharmacy also consulted to resume warfarin for AFib. INR on admission is supra-therapeutic at 5.75.   Plan: -Start meropenem 1 gm IV q 12 hours -Hold warfarin today. F/u daily INR. Resume when INR < [place], PharmD., BCPS Clinical Pharmacist Pager [phone]

## 2016-09-09 ENCOUNTER — Inpatient Hospital Stay (HOSPITAL_COMMUNITY): Payer: Medicare HMO

## 2016-09-09 DIAGNOSIS — I959 Hypotension, unspecified: Secondary | ICD-10-CM

## 2016-09-09 DIAGNOSIS — R21 Rash and other nonspecific skin eruption: Secondary | ICD-10-CM

## 2016-09-09 LAB — BASIC METABOLIC PANEL
ANION GAP: 10 (ref 5–15)
BUN: 34 mg/dL — ABNORMAL HIGH (ref 6–20)
CALCIUM: 8.4 mg/dL — AB (ref 8.9–10.3)
CO2: 27 mmol/L (ref 22–32)
Chloride: 94 mmol/L — ABNORMAL LOW (ref 101–111)
Creatinine, Ser: 2.15 mg/dL — ABNORMAL HIGH (ref 0.44–1.00)
GFR calc Af Amer: 21 mL/min — ABNORMAL LOW (ref >60)
GFR calc non Af Amer: 19 mL/min — ABNORMAL LOW (ref >60)
GLUCOSE: 307 mg/dL — AB (ref 65–99)
Potassium: 3.9 mmol/L (ref 3.5–5.1)
Sodium: 131 mmol/L — ABNORMAL LOW (ref 135–145)

## 2016-09-09 LAB — CBC WITH DIFFERENTIAL/PLATELET
BASOS PCT: 0 %
Basophils Absolute: 0 10*3/uL (ref 0.0–0.1)
Eosinophils Absolute: 0 10*3/uL (ref 0.0–0.7)
Eosinophils Relative: 0 %
HCT: 33.6 % — ABNORMAL LOW (ref 36.0–46.0)
Hemoglobin: 11.2 g/dL — ABNORMAL LOW (ref 12.0–15.0)
LYMPHS PCT: 7 %
Lymphs Abs: 0.6 10*3/uL — ABNORMAL LOW (ref 0.7–4.0)
MCH: 28.4 pg (ref 26.0–34.0)
MCHC: 33.3 g/dL (ref 30.0–36.0)
MCV: 85.3 fL (ref 78.0–100.0)
MONO ABS: 0.1 10*3/uL (ref 0.1–1.0)
MONOS PCT: 1 %
NEUTROS ABS: 8.7 10*3/uL — AB (ref 1.7–7.7)
Neutrophils Relative %: 92 %
PLATELETS: 246 10*3/uL (ref 150–400)
RBC: 3.94 MIL/uL (ref 3.87–5.11)
RDW: 14.6 % (ref 11.5–15.5)
WBC: 9.4 10*3/uL (ref 4.0–10.5)

## 2016-09-09 LAB — TROPONIN I
TROPONIN I: 0.04 ng/mL — AB (ref <0.03)
TROPONIN I: 0.04 ng/mL — AB (ref <0.03)

## 2016-09-09 LAB — PROTIME-INR
INR: 5.11 — AB
PROTHROMBIN TIME: 45.9 s — AB (ref 11.4–15.2)

## 2016-09-09 LAB — GLUCOSE, CAPILLARY
GLUCOSE-CAPILLARY: 278 mg/dL — AB (ref 65–99)
GLUCOSE-CAPILLARY: 313 mg/dL — AB (ref 65–99)
GLUCOSE-CAPILLARY: 269 mg/dL — AB (ref 65–99)
Glucose-Capillary: 288 mg/dL — ABNORMAL HIGH (ref 65–99)
Glucose-Capillary: 298 mg/dL — ABNORMAL HIGH (ref 65–99)

## 2016-09-09 MED ORDER — BISACODYL 10 MG RE SUPP
10.0000 mg | Freq: Every day | RECTAL | Status: DC | PRN
  Administered 2016-09-10: 10 mg via RECTAL
  Filled 2016-09-09: qty 1

## 2016-09-09 MED ORDER — SENNA 8.6 MG PO TABS
2.0000 | ORAL_TABLET | Freq: Every day | ORAL | Status: DC
Start: 2016-09-09 — End: 2016-09-11
  Administered 2016-09-09 – 2016-09-10 (×2): 17.2 mg via ORAL
  Filled 2016-09-09 (×4): qty 2

## 2016-09-09 MED ORDER — WARFARIN - PHARMACIST DOSING INPATIENT
Freq: Every day | Status: DC
  Administered 2016-09-11: 18:00:00

## 2016-09-09 MED ORDER — GABAPENTIN 100 MG PO CAPS
100.0000 mg | ORAL_CAPSULE | Freq: Two times a day (BID) | ORAL | Status: DC
  Administered 2016-09-09 – 2016-09-11 (×6): 100 mg via ORAL
  Filled 2016-09-09 (×6): qty 1

## 2016-09-09 MED ORDER — POLYETHYLENE GLYCOL 3350 17 G PO PACK
17.0000 g | PACK | Freq: Every day | ORAL | Status: DC
  Filled 2016-09-09: qty 1

## 2016-09-09 MED ORDER — INSULIN GLARGINE 100 UNIT/ML ~~LOC~~ SOLN
5.0000 [IU] | Freq: Every day | SUBCUTANEOUS | Status: DC
  Administered 2016-09-09 – 2016-09-11 (×3): 5 [IU] via SUBCUTANEOUS
  Filled 2016-09-09 (×3): qty 0.05

## 2016-09-09 MED ORDER — SODIUM CHLORIDE 0.9 % IV SOLN
INTRAVENOUS | Status: DC
  Administered 2016-09-09: 13:00:00 via INTRAVENOUS

## 2016-09-09 NOTE — Evaluation (Signed)
Physical Therapy Evaluation Patient Details Name: [name] MRN: [id number] DOB: 1920-08-02 Today's Date: 09/09/2016   History of Present Illness  Patient is a 83 yo female admitted 09/08/16 with UTI, hypotension, rash.  Patient also with elevated INR.      PMH:  Legally BLIND, Afib, CAD, MI, CABG, CHF, DM, dementia, CKD, HLD, PPM, HTN  Clinical Impression  Patient presents with problems listed below.  Will benefit from acute PT to maximize functional mobility prior to return home with daughter.  Recommend patient continue HHPT at discharge for continued therapy/mobility training.       Follow Up Recommendations Home health PT;Supervision/Assistance - 24 hour    Equipment Recommendations  None recommended by PT    Recommendations for Other Services OT consult     Precautions / Restrictions Precautions Precautions: Fall Restrictions Weight Bearing Restrictions: No      Mobility  Bed Mobility Overal bed mobility: Needs Assistance Bed Mobility: Rolling;Sidelying to Sit;Sit to Sidelying Rolling: Min assist Sidelying to sit: Mod assist     Sit to sidelying: Mod assist General bed mobility comments: Verbal and tactile cues for technique.  Hand-over-hand assist to reach for rail.  Assist to bring hips over to complete roll.  Assist to bring LE's off of bed and to raise trunk to upright position.  Once upright, patient able to maintain balance.  However patient with flexed posture and head down.  Encouraged patient to hold head upright.  Patient reports neck and back are painful in sitting with flexed posture, but feels too weak to hold head upright.  Assist to control trunk and bring LE's onto bed to return to sidelying.  Transfers Overall transfer level: Needs assistance Equipment used: 1 person hand held assist Transfers: Sit to/from Stand Sit to Stand: Max assist         General transfer comment: Attempted to stand with patient pushing on rail with one hand and  holding PT's hand with her other hand.  On first attempt, patient did not attempt to assist.  On second attempt, patient reports "I can't do it like this".  Patient held PT around neck and PT held patient around waist.  Patient again provided little effort, with PT having to lift patient to stance.  Once upright, patient able to stand with assist for balance.  At approx 30 seconds, patient states she had to sit down due to weakness.  Patient asked to return to supine.  Ambulation/Gait             General Gait Details: NT  Stairs            Wheelchair Mobility    Modified Rankin (Stroke Patients Only)       Balance Overall balance assessment: Needs assistance Sitting-balance support: No upper extremity supported;Feet supported Sitting balance-Leahy Scale: Fair     Standing balance support: Bilateral upper extremity supported Standing balance-Leahy Scale: Poor                               Pertinent Vitals/Pain Pain Assessment: Faces Faces Pain Scale: Hurts little more Pain Location: neck and upper back in sitting Pain Descriptors / Indicators: Aching;Dull Pain Intervention(s): Monitored during session;Repositioned    Home Living Family/patient expects to be discharged to:: Private residence Living Arrangements: Children (Daughter) Available Help at Discharge: Family;Available 24 hours/day (Daughter lives with patient) Type of Home: House Home Access: Ramped entrance     Home Layout:  One level Home Equipment: Wheelchair - manual;Hospital bed      Prior Function Level of Independence: Needs assistance   Gait / Transfers Assistance Needed: Patient puts hands around daughter's neck and daughter holds patient around waist to stand and transfer to w/c.  Will walk patient like this for very short distance.  Otherwise, daughter pushes patient in w/c.  ADL's / Homemaking Assistance Needed: Assist with ADL's including Aide to assist with  bathing/dressing.        Hand Dominance        Extremity/Trunk Assessment   Upper Extremity Assessment Upper Extremity Assessment: Generalized weakness    Lower Extremity Assessment Lower Extremity Assessment: RLE deficits/detail;LLE deficits/detail RLE Deficits / Details: Strength grossly 3-/5. RLE Sensation: decreased light touch;history of peripheral neuropathy RLE Coordination: decreased gross motor LLE Deficits / Details: Strength grossly 3/5 LLE Sensation: decreased light touch;history of peripheral neuropathy LLE Coordination: decreased gross motor       Communication   Communication: HOH  Cognition Arousal/Alertness: Awake/alert Behavior During Therapy: WFL for tasks assessed/performed Overall Cognitive Status: History of cognitive impairments - at baseline                                        General Comments      Exercises     Assessment/Plan    PT Assessment Patient needs continued PT services  PT Problem List Decreased strength;Decreased activity tolerance;Decreased balance;Decreased mobility;Decreased coordination;Decreased cognition;Decreased knowledge of use of DME;Decreased safety awareness;Impaired sensation;Pain       PT Treatment Interventions DME instruction;Gait training;Functional mobility training;Therapeutic activities;Therapeutic exercise;Balance training;Patient/family education    PT Goals (Current goals can be found in the Care Plan section)  Acute Rehab PT Goals Patient Stated Goal: none stated; Daughter: To get mom home. PT Goal Formulation: With patient/family Time For Goal Achievement: 09/16/16 Potential to Achieve Goals: Fair    Frequency Min 3X/week   Barriers to discharge        Co-evaluation               AM-PAC PT "6 Clicks" Daily Activity  Outcome Measure Difficulty turning over in bed (including adjusting bedclothes, sheets and blankets)?: Unable Difficulty moving from lying on back to  sitting on the side of the bed? : Unable Difficulty sitting down on and standing up from a chair with arms (e.g., wheelchair, bedside commode, etc,.)?: Unable Help needed moving to and from a bed to chair (including a wheelchair)?: A Lot Help needed walking in hospital room?: A Lot Help needed climbing 3-5 steps with a railing? : Total 6 Click Score: 8    End of Session Equipment Utilized During Treatment: Gait belt Activity Tolerance: Patient limited by fatigue Patient left: in bed;with call bell/phone within reach;with family/visitor present   PT Visit Diagnosis: Unsteadiness on feet (R26.81);Muscle weakness (generalized) (M62.81);Difficulty in walking, not elsewhere classified (R26.2);Pain Pain - part of body:  (neck and back)    Time: 6948-5462 PT Time Calculation (min) (ACUTE ONLY): 31 min   Charges:   PT Evaluation $PT Eval Moderate Complexity: 1 Mod PT Treatments $Therapeutic Activity: 8-22 mins   PT G Codes:        Carita Pian. Sanjuana Kava, [facility] Acute Rehab Services Pager [place] 09/09/2016, 6:31 PM

## 2016-09-09 NOTE — Progress Notes (Signed)
Pharmacy Antibiotic Note  [name] is a 83 y.o. female admitted on 09/08/2016 with UTI. She was recently discharged with Macrobid but had an allergic reaction. History of EXBL, so pharmacy has been consulted for meropenem dosing.   SCr trending down, but still elevated at 2.15.   Plan: -Meropenem 1g IV q12h -Monitor renal function, cultures/sensitivities, and clinical progression  Height: 5' (152.4 cm) Weight: 175 lb 0.7 oz (79.4 kg) IBW/kg (Calculated) : 45.5  Temp (24hrs), Avg:98.3 F (36.8 C), Min:97.8 F (36.6 C), Max:98.6 F (37 C)   Recent Labs Lab 09/08/16 1751 09/08/16 1811 09/08/16 2018 09/09/16 0257  WBC 10.5  --   --  9.4  CREATININE 2.27*  --   --  2.15*  LATICACIDVEN  --  2.35* 1.42  --     Estimated Creatinine Clearance: 14.9 mL/min (A) (by C-G formula based on SCr of 2.15 mg/dL (H)).    Allergies  Allergen Reactions  . Macrobid [Nitrofurantoin] Rash and Cough    Wheezing (also) THIS REACTION RESULTED IN THE PATIENT ENDING UP IN THE E.D.  . Darvon Other (See Comments)    Causes confusion  . Digoxin And Related Nausea Only  . Penicillins Other (See Comments)    From childhood: Has patient had a PCN reaction causing immediate rash, facial/tongue/throat swelling, SOB or lightheadedness with hypotension: Unknown Has patient had a PCN reaction causing severe rash involving mucus membranes or skin necrosis: Unknown Has patient had a PCN reaction that required hospitalization: Unknown Has patient had a PCN reaction occurring within the last 10 years: No If all of the above answers are "NO", then may proceed with Cephalosporin use.   Marland Kitchen Percocet [Oxycodone-Acetaminophen] Other (See Comments)    Causes confusion  . Percodan [Oxycodone-Aspirin] Other (See Comments)    Causes confusion  . Vicodin [Hydrocodone-Acetaminophen] Other (See Comments)    Causes confusion    Antimicrobials this admission: Aztreonam 8/24 x1 Levaquin 8/24 x1 Meropenem  8/24>>  Dose adjustments this admission: None  Microbiology results: 8/24 BCx: ngtd 8/24 urine:  Thank you for allowing pharmacy to be a part of this patient's care.  Enma Maeda D. Tiron Suski, PharmD, BCPS Clinical Pharmacist Pager: [phone] Clinical Phone for 09/09/2016 until 3:30pm: [phone] If after 3:30pm, please call main pharmacy at [phone] 09/09/2016 12:28 PM

## 2016-09-09 NOTE — Progress Notes (Signed)
[place] for warfarin Indication: atrial fibrillation  Allergies  Allergen Reactions  . Macrobid [Nitrofurantoin] Rash and Cough    Wheezing (also) THIS REACTION RESULTED IN THE PATIENT ENDING UP IN THE E.D.  . Darvon Other (See Comments)    Causes confusion  . Digoxin And Related Nausea Only  . Penicillins Other (See Comments)    From childhood: Has patient had a PCN reaction causing immediate rash, facial/tongue/throat swelling, SOB or lightheadedness with hypotension: Unknown Has patient had a PCN reaction causing severe rash involving mucus membranes or skin necrosis: Unknown Has patient had a PCN reaction that required hospitalization: Unknown Has patient had a PCN reaction occurring within the last 10 years: No If all of the above answers are "NO", then may proceed with Cephalosporin use.   Marland Kitchen Percocet [Oxycodone-Acetaminophen] Other (See Comments)    Causes confusion  . Percodan [Oxycodone-Aspirin] Other (See Comments)    Causes confusion  . Vicodin [Hydrocodone-Acetaminophen] Other (See Comments)    Causes confusion    Patient Measurements: Height: 5' (152.4 cm) Weight: 175 lb 0.7 oz (79.4 kg) IBW/kg (Calculated) : 45.5  Vital Signs: Temp: 98.5 F (36.9 C) (08/25 0839) Temp Source: Oral (08/25 0839) BP: 107/57 (08/25 0839) Pulse Rate: 85 (08/25 0839)  Labs:  Recent Labs  09/08/16 1751 09/08/16 2110 09/09/16 0257 09/09/16 1042  HGB 10.9*  --  11.2*  --   HCT 34.4*  --  33.6*  --   PLT 238  --  246  --   LABPROT 53.5*  --   --  45.9*  INR 5.75*  --   --  5.11*  CREATININE 2.27*  --  2.15*  --   TROPONINI  --  0.06* 0.04* 0.04*    Estimated Creatinine Clearance: 14.9 mL/min (A) (by C-G formula based on SCr of 2.15 mg/dL (H)).   Assessment: 89 YOF on warfarin for AFib, to be continued during this admission. INR elevated on admission at 5.75. This morning, INR has trended down, but is still high at 5.11.  Hgb  11.2, plts 246- no bleeding noted.  Home dose of warfarin 4.5mg  daily except 3mg  on Mondays and Fridays.  Goal of Therapy:  INR 2-3 Monitor platelets by anticoagulation protocol: Yes   Plan:  Hold warfarin tonight Daily INR Follow for s/s bleeding  Mansel Strother D. Ozzie Knobel, PharmD, BCPS Clinical Pharmacist Pager: [phone] Clinical Phone for 09/09/2016 until 3:30pm: [phone] If after 3:30pm, please call main pharmacy at [phone] 09/09/2016 12:30 PM

## 2016-09-09 NOTE — Progress Notes (Addendum)
PROGRESS NOTE   [name]  JOA:[id number]    DOB: 1920-08-26    DOA: 09/08/2016  PCP: [name], MD   I have briefly reviewed patients previous medical [facility] in [facility].  Brief Narrative:  83 year old female, nonambulatory at baseline, her daughter lives with her, PMH of CAD status post CABG, PAF on Coumadin, second-degree AV block status post PPM, chronic combined systolic and diastolic CHF, stage III chronic kidney disease, GERD, HLD, HTN, DM 2 presented to the ED at the direction of her PCP for evaluation of hypotension and sleepiness. Recently started on nitrofurantoin for suspected UTI but developed diffuse rash and when seen by her PCP on day of admission, noted to be somnolent and hypotensive. In ED BP 78/60. Admitted for hypotension, acute on chronic kidney disease and UTI management. Improving.   Assessment & Plan:   Principal Problem:   Acute lower UTI Active Problems:   Diabetes mellitus (HCC)   Hypotension   Hypokalemia   Hyponatremia   Anemia of chronic disease   Hypothyroidism   History of DVT (deep vein thrombosis)   PAF (paroxysmal atrial fibrillation) (HCC)   Acute kidney injury ([place])   CKD (chronic kidney disease), stage III   Chronic combined systolic and diastolic CHF (congestive heart failure) (HCC)   Elevated troponin I level   CAD (coronary artery disease)   Drug rash   UTI (urinary tract infection)   1. UTI: Reported ongoing dysuria on admission. Developed rash after starting nitrofurantoin OP 5 days PTA. ESBL Escherichia coli UTI in 07/2014. Empirically on IV meropenem pending culture results. Blood cultures 2: Negative. Urine culture pending. 2. Skin rash:? Allergy to nitrofurantoin. Nitroglycerin for input and discontinued. Rash is improving.Status post Solu-Medrol in ED. Since improving, will not continue steroids. When necessary Benadryl. 3. Hypotension: Initial BP 78/60. Suspect due to dehydration and UTI. Received 1 L normal  saline bolus in ED. Diuretics temporarily held. Blood pressures have improved. Monitor. Nitroglycerin patch also held. 4. Acute on stage III chronic kidney disease: Serum creatinine on admission 2.27, up from 1.34 in June 2018. Likely prerenal in the setting of dehydration and hypotension. Received a liter of normal saline in ED. Diuretics temporarily held. Creatinine is marginally better. Continue additional 24 hours of genital IV fluid hydration and follow BMP. Renal ultrasound shows no hydronephrosis but shows mass or tumefactive debris ? etiology 5. Hypokalemia: Replaced. 6. Normocytic anemia/chronic disease: Stable. 7. DM type II with renal complications: S0F 6.3 in February 2018. Holding glipizide. Continue SSI. Add Lantus 5 units daily. 8. Hypothyroid: Continue Synthroid. 9. PAF: CHADS-VASc is 85 (age x2, gender, CVA x2, CHF, DM) . INR supratherapeutic/5.75 on admission. Likely related to antibiotics. Not on rate control medications. VPaced monitor. Coumadin per pharmacy. 10. Chronic combined systolic and diastolic CHF: Clinically volume depleted. Hold Lasix. Brief gentle IV fluids. Resume Lasix possibly discharge patient 11. CAD status post CABG: Asymptomatic of chest pain. Minimal troponin elevation and flat trend likely secondary to acute kidney injury. No further workup. 12. Altered mental status/somnolence: Resolved. 13. ? Bladder mass: Possibly outpatient urology consultation. 14. Dementia: Mental status at baseline.   DVT prophylaxis: INR supratherapeutic on Coumadin. Code Status: Partial/no intubation. Family Communication: Discussed in detail with patient's daughter at bedside. Updated care and answered questions. Disposition: DC home when medically improved.   Consultants:  None   Procedures:  None  Antimicrobials:  Meropenum    Subjective:  Patient states that she feels better. Denies complaints. As per  daughter at bedside, mental status changes have resolved and she  is back to baseline. Skin rash >50% better. No pain reported  ROS:No dyspnea, chest pain, palpitations, dizziness or lightheadedness.  Objective:  Vitals:   09/08/16 2246 09/09/16 0316 09/09/16 0558 09/09/16 0839  BP: (!) 124/59  (!) 92/48 (!) 107/57  Pulse: 85  80 85  Resp: 15  15 16   Temp: 97.8 F (36.6 C)  98.6 F (37 C) 98.5 F (36.9 C)  TempSrc:    Oral  SpO2: 98%  95% 94%  Weight: 79.4 kg (175 lb) 79.4 kg (175 lb 0.7 oz)    Height: 5' (1.524 m)       Examination:  General exam: Pleasant elderly female, moderately built and nourished, lying comfortably supine in bed. Mucosa dry. Respiratory system: Clear to auscultation. Respiratory effort normal. Cardiovascular system: S1 & S2 heard, RRR. No JVD, murmurs, rubs, gallops or clicks. No pedal edema. Telemetry: V paced rhythm  Gastrointestinal system: Abdomen is nondistended, soft and nontender. No organomegaly or masses felt. Normal bowel sounds heard. Central nervous system: Alert and oriented 2 . No focal neurological deficits. Extremities: Symmetric 5 x 5 power. Skin: faint, patchy, blanching erythematous diffuse rash over anterior trunk mainly. Psychiatry: Judgement and insight appear normal. Mood & affect appropriate.     Data Reviewed: I have personally reviewed following labs and imaging studies  CBC:  Recent Labs Lab 09/08/16 1751 09/09/16 0257  WBC 10.5 9.4  NEUTROABS 8.0* 8.7*  HGB 10.9* 11.2*  HCT 34.4* 33.6*  MCV 87.1 85.3  PLT 238 629   Basic Metabolic Panel:  Recent Labs Lab 09/08/16 1751 09/09/16 0257  NA 131* 131*  K 3.1* 3.9  CL 94* 94*  CO2 24 27  GLUCOSE 231* 307*  BUN 34* 34*  CREATININE 2.27* 2.15*  CALCIUM 8.5* 8.4*   Liver Function Tests:  Recent Labs Lab 09/08/16 1751  AST 23  ALT 10*  ALKPHOS 97  BILITOT 0.6  PROT 6.6  ALBUMIN 2.3*   Coagulation Profile:  Recent Labs Lab 09/08/16 1751 09/09/16 1042  INR 5.75* PENDING   Cardiac Enzymes:  Recent Labs Lab  09/08/16 2110 09/09/16 0257 09/09/16 1042  TROPONINI 0.06* 0.04* 0.04*   HbA1C: No results for input(s): HGBA1C in the last 72 hours. CBG:  Recent Labs Lab 09/08/16 2243 09/09/16 0824 09/09/16 0827  GLUCAP 230* 288* 298*    Recent Results (from the past 240 hour(s))  Culture, blood (Routine x 2)     Status: None (Preliminary result)   Collection Time: 09/08/16  5:34 PM  Result Value Ref Range Status   Specimen Description BLOOD LEFT ANTECUBITAL  Final   Special Requests   Final    BOTTLES DRAWN AEROBIC AND ANAEROBIC Blood Culture adequate volume   Culture NO GROWTH < 24 HOURS  Final   Report Status PENDING  Incomplete  Culture, blood (Routine x 2)     Status: None (Preliminary result)   Collection Time: 09/08/16  6:14 PM  Result Value Ref Range Status   Specimen Description BLOOD LEFT ANTECUBITAL  Final   Special Requests   Final    BOTTLES DRAWN AEROBIC AND ANAEROBIC Blood Culture adequate volume   Culture NO GROWTH < 24 HOURS  Final   Report Status PENDING  Incomplete         Radiology Studies: US Renal  Result Date: 09/09/2016 CLINICAL DATA:  Acute kidney injury. EXAM: RENAL / URINARY TRACT ULTRASOUND COMPLETE COMPARISON:  Abdomen ultrasound dated  08/17/2014. Abdomen and pelvis CT dated 02/04/2014. FINDINGS: Right Kidney: Length: 9.8 cm. Echogenicity within normal limits. No mass or hydronephrosis visualized. Left Kidney: Length: 9.9 cm. 1.6 cm mid parenchymal cyst. Normal echotexture. No hydronephrosis. Bladder: Mass or tumefactive debris in the dependent portion of the urinary bladder, measuring 5.0 x 3.1 cm in the transverse plane, not as well visualized in the sagittal plane. IMPRESSION: 1. No hydronephrosis. 2. Mass or tumefactive debris in the dependent portion of the urinary bladder. Electronically Signed   By: Claudie Revering M.D.   On: 09/09/2016 01:18   Dg Chest Portable 1 View  Result Date: 09/08/2016 CLINICAL DATA:  Shortness of breath. Allergic reaction to  antibiotic. Hypotension. EXAM: PORTABLE CHEST 1 VIEW COMPARISON:  One-view chest x-ray 06/27/2015 FINDINGS: The heart is enlarged. Pulmonary vascular congestion is not significantly changed. Aeration is slightly improved. There is no focal airspace disease. CABG is noted. Pacing wires stable. Left neck surgical clips are stable. IMPRESSION: 1. Stable cardiomegaly and pulmonary vascular congestion without frank edema or focal airspace disease. Electronically Signed   By: San Morelle M.D.   On: 09/08/2016 19:21        Scheduled Meds: . donepezil  5 mg Oral QHS  . folic acid  1 mg Oral Daily  . gabapentin  100 mg Oral BID  . insulin aspart  0-5 Units Subcutaneous QHS  . insulin aspart  0-9 Units Subcutaneous TID WC  . levothyroxine  62.5 mcg Oral Daily  . loratadine  10 mg Oral Daily  . lubiprostone  24 mcg Oral BID WC  . pantoprazole  40 mg Oral Daily  . potassium chloride  30 mEq Oral BID  . rosuvastatin  20 mg Oral q1800  . sodium chloride flush  3 mL Intravenous Q12H  . sodium chloride flush  3 mL Intravenous Q12H   Continuous Infusions: . sodium chloride    . meropenem (MERREM) IV Stopped (09/09/16 1145)     LOS: 1 day     Mindee Robledo, MD, FACP, FHM. [facility] Pager [phone]  If 7PM-7AM, please contact night-coverage [email or web address] Password [facility] 09/09/2016, 12:15 PM

## 2016-09-09 NOTE — Progress Notes (Signed)
New Admission Note:  Arrival Method: By bed from ED around 2230 Mental Orientation: Alert and oriented Telemetry: None Assessment: Completed Skin: Completed, refer to flowsheets IV: Right forearm Pain: Denies Tubes: None Safety Measures: Safety Fall Prevention Plan was given, discussed and signed. Admission: Completed [address] Orientation: Patient has been orientated to the room, unit and the staff. Family: At bedside  Orders have been reviewed and implemented. Will continue to monitor the patient. Call light has been placed within reach and bed alarm has been activated.   Perry Mount, RN  Phone Number: [phone]

## 2016-09-10 ENCOUNTER — Inpatient Hospital Stay (HOSPITAL_COMMUNITY): Payer: Medicare HMO

## 2016-09-10 LAB — BASIC METABOLIC PANEL
ANION GAP: 10 (ref 5–15)
BUN: 38 mg/dL — ABNORMAL HIGH (ref 6–20)
CHLORIDE: 98 mmol/L — AB (ref 101–111)
CO2: 26 mmol/L (ref 22–32)
Calcium: 8.9 mg/dL (ref 8.9–10.3)
Creatinine, Ser: 1.75 mg/dL — ABNORMAL HIGH (ref 0.44–1.00)
GFR calc non Af Amer: 24 mL/min — ABNORMAL LOW (ref >60)
GFR, EST AFRICAN AMERICAN: 28 mL/min — AB (ref >60)
Glucose, Bld: 133 mg/dL — ABNORMAL HIGH (ref 65–99)
POTASSIUM: 3.7 mmol/L (ref 3.5–5.1)
SODIUM: 134 mmol/L — AB (ref 135–145)

## 2016-09-10 LAB — GLUCOSE, CAPILLARY
GLUCOSE-CAPILLARY: 115 mg/dL — AB (ref 65–99)
GLUCOSE-CAPILLARY: 127 mg/dL — AB (ref 65–99)
GLUCOSE-CAPILLARY: 125 mg/dL — AB (ref 65–99)
Glucose-Capillary: 131 mg/dL — ABNORMAL HIGH (ref 65–99)

## 2016-09-10 LAB — CBC
HCT: 30.3 % — ABNORMAL LOW (ref 36.0–46.0)
HEMOGLOBIN: 10.2 g/dL — AB (ref 12.0–15.0)
MCH: 28.8 pg (ref 26.0–34.0)
MCHC: 33.7 g/dL (ref 30.0–36.0)
MCV: 85.6 fL (ref 78.0–100.0)
Platelets: 276 10*3/uL (ref 150–400)
RBC: 3.54 MIL/uL — AB (ref 3.87–5.11)
RDW: 15.2 % (ref 11.5–15.5)
WBC: 16.7 10*3/uL — ABNORMAL HIGH (ref 4.0–10.5)

## 2016-09-10 LAB — PROTIME-INR
INR: 4.98 — AB
PROTHROMBIN TIME: 47.7 s — AB (ref 11.4–15.2)

## 2016-09-10 MED ORDER — MILK AND MOLASSES ENEMA
1.0000 | Freq: Once | RECTAL | Status: AC
  Administered 2016-09-10: 250 mL via RECTAL
  Filled 2016-09-10: qty 250

## 2016-09-10 NOTE — Progress Notes (Signed)
[place] for warfarin Indication: atrial fibrillation  Allergies  Allergen Reactions  . Macrobid [Nitrofurantoin] Rash and Cough    Wheezing (also) THIS REACTION RESULTED IN THE PATIENT ENDING UP IN THE E.D.  . Darvon Other (See Comments)    Causes confusion  . Digoxin And Related Nausea Only  . Penicillins Other (See Comments)    From childhood: Has patient had a PCN reaction causing immediate rash, facial/tongue/throat swelling, SOB or lightheadedness with hypotension: Unknown Has patient had a PCN reaction causing severe rash involving mucus membranes or skin necrosis: Unknown Has patient had a PCN reaction that required hospitalization: Unknown Has patient had a PCN reaction occurring within the last 10 years: No If all of the above answers are "NO", then may proceed with Cephalosporin use.   Marland Kitchen Percocet [Oxycodone-Acetaminophen] Other (See Comments)    Causes confusion  . Percodan [Oxycodone-Aspirin] Other (See Comments)    Causes confusion  . Vicodin [Hydrocodone-Acetaminophen] Other (See Comments)    Causes confusion    Patient Measurements: Height: 5' (152.4 cm) Weight: 177 lb 14.6 oz (80.7 kg) IBW/kg (Calculated) : 45.5  Vital Signs:    Labs:  Recent Labs  09/08/16 1751 09/08/16 2110 09/09/16 0257 09/09/16 1042 09/10/16 0227  HGB 10.9*  --  11.2*  --  10.2*  HCT 34.4*  --  33.6*  --  30.3*  PLT 238  --  246  --  276  LABPROT 53.5*  --   --  45.9* 47.7*  INR 5.75*  --   --  5.11* 4.98*  CREATININE 2.27*  --  2.15*  --  1.75*  TROPONINI  --  0.06* 0.04* 0.04*  --     Estimated Creatinine Clearance: 18.5 mL/min (A) (by C-G formula based on SCr of 1.75 mg/dL (H)).   Assessment: 89 YOF on warfarin for AFib, to be continued during this admission. INR elevated on admission at 5.75.   INR is trending down, but is still elevated this morning at 4.98.  Hgb 10.2, plts 276- no bleeding noted.  Home dose of warfarin  4.5mg  daily except 3mg  on Mondays and Fridays.  Goal of Therapy:  INR 2-3 Monitor platelets by anticoagulation protocol: Yes   Plan:  Hold warfarin tonight Daily INR Follow for s/s bleeding  [name], PharmD, BCPS Clinical Pharmacist Pager: [phone] Clinical Phone for 09/10/2016 until 3:30pm: [phone] If after 3:30pm, please call main pharmacy at [phone] 09/10/2016 11:29 AM

## 2016-09-10 NOTE — Progress Notes (Signed)
PROGRESS NOTE   [name]  CLE:[id number]    DOB: 24-Aug-1920    DOA: 09/08/2016  PCP: [name], MD   I have briefly reviewed patients previous medical records in [facility].  Brief Narrative:  83 year old female, nonambulatory at baseline, her daughter lives with her, PMH of CAD status post CABG, PAF on Coumadin, second-degree AV block status post PPM, chronic combined systolic and diastolic CHF, stage III chronic kidney disease, GERD, HLD, HTN, DM 2 presented to the ED at the direction of her PCP for evaluation of hypotension and sleepiness. Recently started on nitrofurantoin for suspected UTI but developed diffuse rash and when seen by her PCP on day of admission, noted to be somnolent and hypotensive. In ED BP 78/60. Admitted for hypotension, acute on chronic kidney disease and UTI management. Improving.   Assessment & Plan:   Principal Problem:   Acute lower UTI Active Problems:   Diabetes mellitus (HCC)   Hypotension   Hypokalemia   Hyponatremia   Anemia of chronic disease   Hypothyroidism   History of DVT (deep vein thrombosis)   PAF (paroxysmal atrial fibrillation) (HCC)   Acute kidney injury ([place])   CKD (chronic kidney disease), stage III   Chronic combined systolic and diastolic CHF (congestive heart failure) (HCC)   Elevated troponin I level   CAD (coronary artery disease)   Drug rash   UTI (urinary tract infection)   1. UTI: Reported ongoing dysuria on admission. Developed rash after starting nitrofurantoin OP 5 days PTA. ESBL Escherichia coli UTI in 07/2014. Empirically on IV meropenem pending culture results. Blood cultures 2: Negative to date. Urine culture preliminary results shows 50 K colonies/ml of gram-negative rods (? Partially treated UTI). Not sure if UTI was the cause for her initial presentation of altered mental status and hypotension rather than volume depletion/dehydration. 2. Skin rash:? Allergy to nitrofurantoin. Nitrofurantoin was  discontinued. Rash has almost resolved.Status post Solu-Medrol in ED. No steroids were continued on the floor. When necessary Benadryl. 3. Hypotension: Initial BP 78/60. Suspect due to dehydration and UTI. Received 1 L normal saline bolus in ED. Diuretics temporarily held. Blood pressures have improved. Monitor. Nitroglycerin patch also held. Soft but stable blood pressures with SBP in the 100s. 4. Acute on stage III chronic kidney disease: Serum creatinine on admission 2.27, up from 1.34 in June 2018. Likely prerenal in the setting of dehydration and hypotension. Received a liter of normal saline in ED. Diuretics temporarily held. Renal ultrasound shows no hydronephrosis but shows mass or tumefactive debris ? etiology. Treated with brief and gentle IV fluids. Creatinine has improved from 2.15-1.75. Patient had some new cough last night hence chest x-ray obtained, results reviewed, chronic changes and no overt pulmonary edema but DC IV fluids due to concern for CHF. Continue to hold diuretics for additional day. May consider resuming in a.m. if creatinine continues to improve. 5. Hypokalemia: Replaced. 6. Normocytic anemia/chronic disease: Stable. 7. DM type II with renal complications: B4W 6.3 in February 2018. Holding glipizide. Continue SSI. Added Lantus 5 units daily. Better controlled. 8. Hypothyroid: Continue Synthroid. 9. PAF: CHADS-VASc is 80 (age x2, gender, CVA x2, CHF, DM) . INR supratherapeutic/5.75 on admission. Likely related to antibiotics. Not on rate control medications. VPaced monitor. Coumadin per pharmacy. INR today 4.98, decreasing but continue to hold Coumadin 10. Chronic combined systolic and diastolic CHF: Clinically was dehydrated on admission. Temporarily holding Lasix. Consider resuming 8/27. 11. CAD status post CABG: Asymptomatic of chest pain. Minimal  troponin elevation and flat trend likely secondary to acute kidney injury. No further workup. 12. Altered mental  status/somnolence: Resolved.? Related to dehydration, hypotension, acute on chronic kidney disease rather than UTI. 13. ? Bladder mass: Possibly outpatient urology consultation. 14. Dementia: Mental status at baseline. 15. Constipation: Chronic. Bowel regimen initiated.   DVT prophylaxis: INR supratherapeutic on Coumadin. Code Status: Partial/no intubation. Family Communication: Discussed in detail with patient's daughter at bedside. Updated care and answered questions. Disposition: DC home when medically improved, possibly 8/27 with home health PT and 24-hour supervision from daughter.   Consultants:  None   Procedures:  None  Antimicrobials:  Meropenum    Subjective: Some dry cough last night. No dyspnea. Overall feels much better. Mental status changes resolved. Skin rash almost resolved. No pruritus reported. Chronic constipation, usually has 2 BMs per week. No BM for 4-5 days. No abdominal pain, nausea or vomiting.  ROS:No dyspnea, chest pain, palpitations, dizziness or lightheadedness.  Objective:  Vitals:   09/09/16 0839 09/09/16 1715 09/09/16 2129 09/10/16 0500  BP: (!) 107/57 (!) 100/50 (!) 108/59   Pulse: 85 85 86   Resp: 16 18 18    Temp: 98.5 F (36.9 C) 97.8 F (36.6 C) 97.9 F (36.6 C)   TempSrc: Oral Oral Oral   SpO2: 94% 95% 97%   Weight:   80.7 kg (178 lb) 80.7 kg (177 lb 14.6 oz)  Height:        Examination:  General exam: Pleasant elderly female, moderately built and nourished, lying comfortably supine in bed. Mucosa moist now. Sitting up in bed and eating breakfast this morning. Respiratory system: Occasional basal crackles but otherwise clear to auscultation. No increased work of breathing. Cardiovascular system: S1 & S2 heard, RRR. No JVD, murmurs, rubs, gallops or clicks. No pedal edema. Telemetry: V paced rhythm. Stable without change.  Gastrointestinal system: Abdomen is nondistended, soft and nontender. No organomegaly or masses felt. Normal  bowel sounds heard. Central nervous system: Alert and oriented 2 . No focal neurological deficits. Extremities: Symmetric 5 x 5 power. Skin: faint, patchy, blanching erythematous diffuse rash over anterior trunk mainly. Rash has almost resolved and barely visible on abdomen. Psychiatry: Judgement and insight appear normal. Mood & affect appropriate.     Data Reviewed: I have personally reviewed following labs and imaging studies  CBC:  Recent Labs Lab 09/08/16 1751 09/09/16 0257 09/10/16 0227  WBC 10.5 9.4 16.7*  NEUTROABS 8.0* 8.7*  --   HGB 10.9* 11.2* 10.2*  HCT 34.4* 33.6* 30.3*  MCV 87.1 85.3 85.6  PLT 238 246 154   Basic Metabolic Panel:  Recent Labs Lab 09/08/16 1751 09/09/16 0257 09/10/16 0227  NA 131* 131* 134*  K 3.1* 3.9 3.7  CL 94* 94* 98*  CO2 24 27 26   GLUCOSE 231* 307* 133*  BUN 34* 34* 38*  CREATININE 2.27* 2.15* 1.75*  CALCIUM 8.5* 8.4* 8.9   Liver Function Tests:  Recent Labs Lab 09/08/16 1751  AST 23  ALT 10*  ALKPHOS 97  BILITOT 0.6  PROT 6.6  ALBUMIN 2.3*   Coagulation Profile:  Recent Labs Lab 09/08/16 1751 09/09/16 1042 09/10/16 0227  INR 5.75* 5.11* 4.98*   Cardiac Enzymes:  Recent Labs Lab 09/08/16 2110 09/09/16 0257 09/09/16 1042  TROPONINI 0.06* 0.04* 0.04*   HbA1C: No results for input(s): HGBA1C in the last 72 hours. CBG:  Recent Labs Lab 09/09/16 1232 09/09/16 1719 09/09/16 2012 09/10/16 0833 09/10/16 1215  GLUCAP 278* 313* 269* 115* 127*  Recent Results (from the past 240 hour(s))  Culture, blood (Routine x 2)     Status: None (Preliminary result)   Collection Time: 09/08/16  5:34 PM  Result Value Ref Range Status   Specimen Description BLOOD LEFT ANTECUBITAL  Final   Special Requests   Final    BOTTLES DRAWN AEROBIC AND ANAEROBIC Blood Culture adequate volume   Culture NO GROWTH 2 DAYS  Final   Report Status PENDING  Incomplete  Culture, blood (Routine x 2)     Status: None (Preliminary  result)   Collection Time: 09/08/16  6:14 PM  Result Value Ref Range Status   Specimen Description BLOOD LEFT ANTECUBITAL  Final   Special Requests   Final    BOTTLES DRAWN AEROBIC AND ANAEROBIC Blood Culture adequate volume   Culture NO GROWTH 2 DAYS  Final   Report Status PENDING  Incomplete  Urine Culture     Status: Abnormal (Preliminary result)   Collection Time: 09/08/16  7:00 PM  Result Value Ref Range Status   Specimen Description URINE, CATHETERIZED  Final   Special Requests NONE  Final   Culture 50,000 COLONIES/mL GRAM NEGATIVE RODS (A)  Final   Report Status PENDING  Incomplete         Radiology Studies: US Renal  Result Date: 09/09/2016 CLINICAL DATA:  Acute kidney injury. EXAM: RENAL / URINARY TRACT ULTRASOUND COMPLETE COMPARISON:  Abdomen ultrasound dated 08/17/2014. Abdomen and pelvis CT dated 02/04/2014. FINDINGS: Right Kidney: Length: 9.8 cm. Echogenicity within normal limits. No mass or hydronephrosis visualized. Left Kidney: Length: 9.9 cm. 1.6 cm mid parenchymal cyst. Normal echotexture. No hydronephrosis. Bladder: Mass or tumefactive debris in the dependent portion of the urinary bladder, measuring 5.0 x 3.1 cm in the transverse plane, not as well visualized in the sagittal plane. IMPRESSION: 1. No hydronephrosis. 2. Mass or tumefactive debris in the dependent portion of the urinary bladder. Electronically Signed   By: Claudie Revering M.D.   On: 09/09/2016 01:18   Dg Chest Port 1 View  Result Date: 09/10/2016 CLINICAL DATA:  Cough. EXAM: PORTABLE CHEST 1 VIEW COMPARISON:  09/08/2016 FINDINGS: 1022 hours. The cardio pericardial silhouette is enlarged. Stable asymmetric elevation right hemidiaphragm. Interstitial markings are diffusely coarsened with chronic features. Patient is status post CABG. Left permanent pacemaker again noted. The visualized bony structures of the thorax are intact. Telemetry leads overlie the chest. IMPRESSION: Stable exam.  No new or acute  interval findings. Electronically Signed   By: Misty Stanley M.D.   On: 09/10/2016 10:39   Dg Chest Portable 1 View  Result Date: 09/08/2016 CLINICAL DATA:  Shortness of breath. Allergic reaction to antibiotic. Hypotension. EXAM: PORTABLE CHEST 1 VIEW COMPARISON:  One-view chest x-ray 06/27/2015 FINDINGS: The heart is enlarged. Pulmonary vascular congestion is not significantly changed. Aeration is slightly improved. There is no focal airspace disease. CABG is noted. Pacing wires stable. Left neck surgical clips are stable. IMPRESSION: 1. Stable cardiomegaly and pulmonary vascular congestion without frank edema or focal airspace disease. Electronically Signed   By: San Morelle M.D.   On: 09/08/2016 19:21        Scheduled Meds: . donepezil  5 mg Oral QHS  . folic acid  1 mg Oral Daily  . gabapentin  100 mg Oral BID  . insulin aspart  0-5 Units Subcutaneous QHS  . insulin aspart  0-9 Units Subcutaneous TID WC  . insulin glargine  5 Units Subcutaneous Daily  . levothyroxine  62.5 mcg Oral Daily  .  loratadine  10 mg Oral Daily  . lubiprostone  24 mcg Oral BID WC  . pantoprazole  40 mg Oral Daily  . polyethylene glycol  17 g Oral Daily  . potassium chloride  30 mEq Oral BID  . rosuvastatin  20 mg Oral q1800  . senna  2 tablet Oral Daily  . sodium chloride flush  3 mL Intravenous Q12H  . sodium chloride flush  3 mL Intravenous Q12H  . Warfarin - Pharmacist Dosing Inpatient   Does not apply q1800   Continuous Infusions: . sodium chloride    . meropenem (MERREM) IV Stopped (09/10/16 1147)     LOS: 2 days     Glenard Keesling, MD, FACP, FHM. [facility] Pager [phone]  If 7PM-7AM, please contact night-coverage [email or web address] Password [email or web address] 09/10/2016, 2:18 PM

## 2016-09-10 NOTE — Progress Notes (Signed)
CRITICAL VALUE ALERT  Critical Value:  INR 4.98  Date & Time Notied:  09/10/2016  0440  Provider Notified: Opyd  Orders Received/Actions taken: no new orders

## 2016-09-11 ENCOUNTER — Inpatient Hospital Stay (HOSPITAL_COMMUNITY): Payer: Medicare HMO

## 2016-09-11 ENCOUNTER — Telehealth: Payer: Self-pay

## 2016-09-11 DIAGNOSIS — K5641 Fecal impaction: Secondary | ICD-10-CM

## 2016-09-11 LAB — BASIC METABOLIC PANEL
ANION GAP: 11 (ref 5–15)
BUN: 34 mg/dL — ABNORMAL HIGH (ref 6–20)
CALCIUM: 9 mg/dL (ref 8.9–10.3)
CHLORIDE: 99 mmol/L — AB (ref 101–111)
CO2: 27 mmol/L (ref 22–32)
CREATININE: 1.5 mg/dL — AB (ref 0.44–1.00)
GFR calc non Af Amer: 29 mL/min — ABNORMAL LOW (ref >60)
GFR, EST AFRICAN AMERICAN: 33 mL/min — AB (ref >60)
Glucose, Bld: 137 mg/dL — ABNORMAL HIGH (ref 65–99)
Potassium: 3.4 mmol/L — ABNORMAL LOW (ref 3.5–5.1)
SODIUM: 137 mmol/L (ref 135–145)

## 2016-09-11 LAB — URINE CULTURE

## 2016-09-11 LAB — CBC
HEMATOCRIT: 32.3 % — AB (ref 36.0–46.0)
Hemoglobin: 10.7 g/dL — ABNORMAL LOW (ref 12.0–15.0)
MCH: 28.5 pg (ref 26.0–34.0)
MCHC: 33.1 g/dL (ref 30.0–36.0)
MCV: 86.1 fL (ref 78.0–100.0)
PLATELETS: 200 10*3/uL (ref 150–400)
RBC: 3.75 MIL/uL — AB (ref 3.87–5.11)
RDW: 15.4 % (ref 11.5–15.5)
WBC: 11.1 10*3/uL — AB (ref 4.0–10.5)

## 2016-09-11 LAB — GLUCOSE, CAPILLARY
GLUCOSE-CAPILLARY: 96 mg/dL (ref 65–99)
Glucose-Capillary: 152 mg/dL — ABNORMAL HIGH (ref 65–99)
Glucose-Capillary: 127 mg/dL — ABNORMAL HIGH (ref 65–99)

## 2016-09-11 LAB — PROTIME-INR
INR: 3.32
Prothrombin Time: 34.5 seconds — ABNORMAL HIGH (ref 11.4–15.2)

## 2016-09-11 MED ORDER — SORBITOL 70 % SOLN
60.0000 mL | Freq: Once | Status: AC
  Administered 2016-09-11: 60 mL via ORAL
  Filled 2016-09-11: qty 60

## 2016-09-11 MED ORDER — FOSFOMYCIN TROMETHAMINE 3 G PO PACK
3.0000 g | PACK | Freq: Once | ORAL | Status: AC
  Administered 2016-09-11: 3 g via ORAL
  Filled 2016-09-11: qty 3

## 2016-09-11 MED ORDER — WARFARIN SODIUM 1 MG PO TABS
1.0000 mg | ORAL_TABLET | Freq: Once | ORAL | Status: AC
  Administered 2016-09-11: 1 mg via ORAL
  Filled 2016-09-11: qty 1

## 2016-09-11 MED ORDER — FUROSEMIDE 40 MG PO TABS
40.0000 mg | ORAL_TABLET | Freq: Every day | ORAL | Status: DC
  Administered 2016-09-11: 40 mg via ORAL
  Filled 2016-09-11: qty 1

## 2016-09-11 MED ORDER — DOCUSATE SODIUM 283 MG RE ENEM
1.0000 | ENEMA | Freq: Once | RECTAL | Status: AC
  Administered 2016-09-11: 283 mg via RECTAL
  Filled 2016-09-11: qty 1

## 2016-09-11 MED ORDER — SODIUM CHLORIDE 0.9 % IV SOLN
500.0000 mg | Freq: Two times a day (BID) | INTRAVENOUS | Status: AC
  Administered 2016-09-11: 500 mg via INTRAVENOUS
  Filled 2016-09-11: qty 0.5

## 2016-09-11 MED ORDER — WARFARIN SODIUM 3 MG PO TABS: 3.0000 mg | ORAL_TABLET | Freq: Every day | ORAL | Status: DC

## 2016-09-11 MED ORDER — MILK AND MOLASSES ENEMA
1.0000 | Freq: Once | RECTAL | Status: DC
  Filled 2016-09-11: qty 250

## 2016-09-11 MED ORDER — FOSFOMYCIN TROMETHAMINE 3 G PO PACK
3.0000 g | PACK | Freq: Once | ORAL | Status: DC
  Filled 2016-09-11: qty 3

## 2016-09-11 MED ORDER — FUROSEMIDE 40 MG PO TABS: ORAL_TABLET | ORAL | Status: DC

## 2016-09-11 MED ORDER — TRAMADOL HCL 50 MG PO TABS: 50.0000 mg | ORAL_TABLET | Freq: Two times a day (BID) | ORAL | Status: AC | PRN

## 2016-09-11 NOTE — Progress Notes (Signed)
ANTICOAGULATION CONSULT NOTE  Pharmacy Consult for warfarin Indication: atrial fibrillation  Patient Measurements: Height: 5' (152.4 cm) Weight: 177 lb 14.6 oz (80.7 kg) IBW/kg (Calculated) : 45.5  Vital Signs: Temp: 98.3 F (36.8 C) (08/27 0549) Temp Source: Oral (08/27 0549) BP: 109/83 (08/27 1024) Pulse Rate: 89 (08/27 1024)  Labs:  Recent Labs  09/08/16 2110 09/09/16 0257 09/09/16 1042 09/10/16 0227 09/11/16 0743 09/11/16 0917  HGB  --  11.2*  --  10.2* 10.7*  --   HCT  --  33.6*  --  30.3* 32.3*  --   PLT  --  246  --  276 200  --   LABPROT  --   --  45.9* 47.7* 34.5*  --   INR  --   --  5.11* 4.98* 3.32  --   CREATININE  --  2.15*  --  1.75*  --  1.50*  TROPONINI 0.06* 0.04* 0.04*  --   --   --     Estimated Creatinine Clearance: 21.6 mL/min (A) (by C-G formula based on SCr of 1.5 mg/dL (H)).   Assessment: [name] on warfarin PTA for Afib. Admit INR 5.75 on PTA dose of warfarin 4.5mg  daily except 3mg  on Mondays and Fridays.  INR today remains SUPRAtherapeutic though trending down (INR 3.32 << 4.98, goal of 2-3). CBC low but stable - no bleeding noted at this time. Will attempt a small dose tonight given large drop down in INR. Noted poor po intake which will increase warfarin sensitivity.   Goal of Therapy:  INR 2-3 Monitor platelets by anticoagulation protocol: Yes   Plan:  1. Warfarin 1 mg x 1 dose at 1800 today 2. Will continue to monitor for any signs/symptoms of bleeding and will follow up with PT/INR in the a.m.   Thank you for allowing pharmacy to be a part of this patient's care.  Alycia Rossetti, PharmD, BCPS Clinical Pharmacist Pager: [phone] Clinical phone for 09/11/2016 from 7a-3:30p: [phone] If after 3:30p, please call main pharmacy at: [phone] 09/11/2016 12:02 PM

## 2016-09-11 NOTE — Telephone Encounter (Signed)
**Note De-Identified  Obfuscation** Pt did not pick up requested samples of Renexa. Placed back in closet.

## 2016-09-11 NOTE — Progress Notes (Signed)
Patient discharged to home with daughter. IV removed. Telemetry removed. All discharge instructions reviewed with the patients daughter. Followup appts reviewed with daughter. Home Health set up. All belongings with patient. Patient left the unit in stable condition via wheelchair.  Sheliah Plane RN

## 2016-09-11 NOTE — Discharge Instructions (Signed)

## 2016-09-11 NOTE — Discharge Summary (Addendum)
Physician Discharge Summary  [name] AST:[id number] DOB: 1920/09/19  PCP: [name], MD  Admit date: 09/08/2016 Discharge date: 09/11/2016  Recommendations for Outpatient Follow-up:  1. Dr. Kevan Ny, PCP in 2-3 days. To review lab work that will be drawn by home health services and sent to PCP. Please follow final blood culture results that were sent from the hospital. 2. Dr. Sanda Klein, Cardiology in 1 week. 3. Consider outpatient urology consultation for questionable bladder mass or debris noted on renal ultrasound.  Home Health: PT, OT, RN, clinical social worker and Aide Home health RN to draw labs (CBC, BMP, PT & INR) on 09/13/16 and forward results to patient's Cardiologist (Dr. Sallyanne Kuster) office Coumadin clinic for Coumadin dose adjustment as needed and recommendations regarding further lab draws. Please also forward these lab results to patient's PCP. Equipment/Devices: None.    Discharge Condition: Improved and stable.  CODE STATUS: Partial /no intubation. Diet recommendation: Heart healthy and diabetic diet.  Discharge Diagnoses:  Principal Problem:   Acute lower UTI Active Problems:   Diabetes mellitus (HCC)   Hypotension   Hypokalemia   Hyponatremia   Anemia of chronic disease   Hypothyroidism   History of DVT (deep vein thrombosis)   PAF (paroxysmal atrial fibrillation) (HCC)   Acute kidney injury ([place])   CKD (chronic kidney disease), stage III   Chronic combined systolic and diastolic CHF (congestive heart failure) (HCC)   Elevated troponin I level   CAD (coronary artery disease)   Drug rash   UTI (urinary tract infection)   Brief Summary: 83 year old female, nonambulatory at baseline, her daughter lives with her, PMH of CAD status post CABG, PAF on Coumadin, second-degree AV block status post PPM, chronic combined systolic and diastolic CHF, stage III chronic kidney disease, GERD, HLD, HTN, DM 2 presented to the ED at the direction of her PCP  for evaluation of hypotension and sleepiness. Recently started on nitrofurantoin for suspected UTI but developed diffuse rash and when seen by her PCP on day of admission, noted to be somnolent and hypotensive. In ED BP 78/60. Admitted for hypotension, acute on chronic kidney disease and UTI management.    Assessment & Plan:   1. ESBL Escherichia coli UTI: Reported ongoing dysuria on admission. Developed rash after starting nitrofurantoin OP 5 days PTA. ESBL Escherichia coli UTI in 07/2014. Empirically on IV meropenem pending culture results. Blood cultures 2: Negative to date. Urine culture shows 50 K colonies/ml of ESBL Escherichia coli. Not sure if UTI was the cause for her initial presentation of altered mental status and hypotension rather than volume depletion/dehydration. Patient has completed 3 days of IV meropenem. She will receive her dose of oral fosfomycin prior to discharge that will complete course of antibiotic treatment. 2. Skin rash:? Allergy to nitrofurantoin. Nitrofurantoin was discontinued. Rash has resolved.Status post Solu-Medrol in ED. No steroids were continued on the floor.  3. Hypotension: Initial BP 78/60. Suspect due to dehydration and UTI. Received 1 L normal saline bolus in ED. Diuretics temporarily held. Blood pressures have improved but still soft and in the high 90s-110s SBP range. Transdermal nitroglycerin discontinued and can be reassessed during outpatient follow-up with cardiology. 4. Acute on stage III chronic kidney disease: Serum creatinine on admission 2.27, up from 1.34 in June 2018. Likely prerenal in the setting of dehydration and hypotension. Received a liter of normal saline in ED. Diuretics temporarily held. Renal ultrasound shows no hydronephrosis but shows mass or tumefactive debris ? etiology. Treated with brief  and gentle IV fluids. Creatinine has improved from 2.15-1.75 >1.5. Patient had some new cough 2 nights ago hence chest x-ray obtained, results  reviewed, chronic changes and no overt pulmonary edema but DC IV fluids due to concern for CHF. Resumed Lasix today. Also resume prior home dose of metolazone which she takes a couple times per week. 5. Hypokalemia: Replaced prior to discharge and close outpatient follow-up. 6. Normocytic anemia/chronic disease: Stable. 7. DM type II with renal complications: A1c 6.3 in February 2018. Treated in the hospital with low-dose Lantus and SSI. Resume oral hypoglycemics at discharge. 8. Hypothyroid: Continue Synthroid. 9. PAF: CHADS-VASc is 51 (age x2, gender, CVA x2, CHF, DM) . INR supratherapeutic/5.75 on admission. Likely related to antibiotics. Not on rate control medications. VPaced monitor. Coumadin was held in the hospital and INR was monitored which was gradually decreased to 3.32. As discussed with pharmacy, resume Coumadin from tomorrow at reduced dose of 3 mg daily. Home health to draw PT and INR and send results to patient's Coumadin clinic for dose adjustment as needed. 10. Chronic combined systolic and diastolic CHF: Clinically was dehydrated on admission. Temporarily holding Lasix. Resumed Lasix and metolazone at discharge. 11. CAD status post CABG: Asymptomatic of chest pain. Minimal troponin elevation and flat trend likely secondary to acute kidney injury. No further workup. No chest pain reported. Transdermal nitroglycerin discontinued due to soft blood pressures. 12. Altered mental status/somnolence: Resolved.? Related to dehydration, hypotension, acute on chronic kidney disease rather than UTI. 13. ? Bladder mass: Consider outpatient urology consultation. 14. Dementia: Mental status at baseline. 15. Constipation: Chronic. ? Fecal impaction-resolved. As per daughter, patient has maybe 1 or 2 BMs per week and is on a bowel regimen which she does not seem to be consistently taking. This may be complicated by sedentary lifestyle, pain medications. She received bowel regimen in the hospital in  addition to Dulcolax suppository, enema and digital disimpaction. Had multiple BMs overnight. Denies abdominal pain, nausea vomiting or further rectal pain. KUB does not show significant stools. Continue bowel regimen as outpatient.   Consultants:  None   Procedures:  None   Discharge Instructions  Discharge Instructions    (HEART FAILURE PATIENTS) Call MD:  Anytime you have any of the following symptoms: 1) 3 pound weight gain in 24 hours or 5 pounds in 1 week 2) shortness of breath, with or without a dry hacking cough 3) swelling in the hands, feet or stomach 4) if you have to sleep on extra pillows at night in order to breathe.    Complete by:  As directed    Call MD for:    Complete by:  As directed    Altered mental status or confusion or increased sleepiness.   Call MD for:  difficulty breathing, headache or visual disturbances    Complete by:  As directed    Call MD for:  extreme fatigue    Complete by:  As directed    Call MD for:  persistant dizziness or light-headedness    Complete by:  As directed    Call MD for:  persistant nausea and vomiting    Complete by:  As directed    Call MD for:  severe uncontrolled pain    Complete by:  As directed    Call MD for:  temperature >100.4    Complete by:  As directed    Diet - low sodium heart healthy    Complete by:  As directed    Diet Carb  Modified    Complete by:  As directed    Increase activity slowly    Complete by:  As directed        Medication List    STOP taking these medications   nitroGLYCERIN 0.1 mg/hr patch Commonly known as:  NITRODUR - Dosed in mg/24 hr   ranolazine 500 MG 12 hr tablet Commonly known as:  [name]     TAKE these medications   ACCU-CHEK AVIVA PLUS w/Device Kit Use to check sugars 2 times daily.   ACCU-CHEK FASTCLIX LANCETS Misc Use to check sugar 2 times daily   acetaminophen 500 MG tablet Commonly known as:  TYLENOL Take 500 mg by mouth every 6 (six) hours as needed (pain).    acetaminophen-codeine 300-30 MG tablet Commonly known as:  TYLENOL #3 Take 1 tablet by mouth every 6 (six) hours as needed for severe pain.   AMITIZA 24 MCG capsule Generic drug:  lubiprostone Take 1 capsule (24 mcg total) by mouth 2 (two) times daily with a meal.   ARGININE PO Take 1 capsule by mouth daily.   atropine 1 % ophthalmic solution Place 1 drop into both eyes daily as needed (burning eyes).   docusate sodium 100 MG capsule Commonly known as:  COLACE Take 1 capsule (100 mg total) by mouth 2 (two) times daily. What changed:  when to take this  reasons to take this   donepezil 5 MG tablet Commonly known as:  ARICEPT Take 5 mg by mouth at bedtime.   folic acid 1 MG tablet Commonly known as:  FOLVITE Take 1 tablet (1 mg total) by mouth daily.   furosemide 40 MG tablet Commonly known as:  LASIX Take 40 mg by mouth in the morning and may take an additional 40 mg if weight increases to 170 pounds overnight   gabapentin 100 MG capsule Commonly known as:  NEURONTIN Take 100 mg by mouth 2 (two) times daily.   glipiZIDE 5 MG tablet Commonly known as:  GLUCOTROL Take 1/2 pill daily What changed:  how much to take  how to take this  when to take this  additional instructions   glucose blood test strip Commonly known as:  ACCU-CHEK AVIVA PLUS Use as instructed to check sugar 2 times daily.   levalbuterol 0.63 MG/3ML nebulizer solution Commonly known as:  XOPENEX Take 3 mLs by nebulization 3 (three) times daily as needed for wheezing or shortness of breath.   levothyroxine 125 MCG tablet Commonly known as:  SYNTHROID Take 0.5 tablets (62.5 mcg total) by mouth daily.   loratadine 10 MG tablet Commonly known as:  CLARITIN Take 10 mg by mouth daily.   magnesium citrate Soln Take 1 Bottle by mouth every 7 (seven) days.   meclizine 25 MG tablet Commonly known as:  ANTIVERT Take 25 mg by mouth 3 (three) times daily as needed for dizziness.    metolazone 2.5 MG tablet Commonly known as:  ZAROXOLYN Take 1 tablet (2.5 mg total) by mouth 2 (two) times a week. Please take on Tuesday and Thursday What changed:  additional instructions   MUCUS RELIEF CHEST CONGESTION 400 MG Tabs tablet Generic drug:  guaifenesin Take 400 mg by mouth 2 (two) times daily as needed (cough/ congestion).   ofloxacin 0.3 % ophthalmic solution Commonly known as:  OCUFLOX Place 1 drop into both eyes 4 (four) times daily as needed (for drainage).   omeprazole 40 MG capsule Commonly known as:  PRILOSEC Take 1 capsule (40 mg total)  by mouth 2 (two) times daily.   OVER THE COUNTER MEDICATION Take 1 Can by mouth daily. Enterex nutritional supplement - with choline   OVER THE COUNTER MEDICATION Kidney supplement capsules (Choline 100 mg, trimethylglycine 50 mg, glutathione 50 mg, methyltetrahydrofolate, l-arginine-, l-ornithine, l-citruline): Take 1 capsule by mouth two to three times a week   polyethylene glycol packet Commonly known as:  MIRALAX / GLYCOLAX Take 17 g by mouth once a week.   Potassium Chloride CR 8 MEQ Cpcr capsule CR Commonly known as:  MICRO-K Take 1 capsule (8 mEq total) by mouth daily.   PROAIR HFA 108 (90 Base) MCG/ACT inhaler Generic drug:  albuterol Inhale 2 puffs into the lungs every 6 (six) hours as needed for wheezing or shortness of breath.   REFRESH OP Place 1 drop into both eyes daily as needed (dry eyes).   rosuvastatin 20 MG tablet Commonly known as:  CRESTOR Take 1 tablet (20 mg total) by mouth daily.   traMADol 50 MG tablet Commonly known as:  ULTRAM Take 1 tablet (50 mg total) by mouth 2 (two) times daily as needed for moderate pain.   warfarin 3 MG tablet Commonly known as:  COUMADIN Take 1 tablet (3 mg total) by mouth daily at 6 PM. Or as directed by coumadin clinic. What changed:  how much to take  how to take this  when to take this  additional instructions   zolpidem 10 MG tablet Commonly  known as:  AMBIEN Take 5-10 mg by mouth at bedtime as needed for sleep. What changed:  Another medication with the same name was removed. Continue taking this medication, and follow the directions you see here.      Follow-up Information    [name], MD. Schedule an appointment as soon as possible for a visit.   Specialty:  Internal Medicine Why:  To be seen in 2-3 days. Please review lab work that will be drawn by home health RN. Contact information: [address] [place] [address] [phone]        Croitoru, Dani Gobble, MD. Schedule an appointment as soon as possible for a visit in 1 week(s).   Specialty:  Cardiology Contact information: [address] [place] [address] [phone]          Allergies  Allergen Reactions  . Macrobid [Nitrofurantoin] Rash and Cough    Wheezing (also) THIS REACTION RESULTED IN THE PATIENT ENDING UP IN THE E.D.  . [place] Other (See Comments)    Causes confusion  . Digoxin And Related Nausea Only  . Penicillins Other (See Comments)    From childhood: Has patient had a PCN reaction causing immediate rash, facial/tongue/throat swelling, SOB or lightheadedness with hypotension: Unknown Has patient had a PCN reaction causing severe rash involving mucus membranes or skin necrosis: Unknown Has patient had a PCN reaction that required hospitalization: Unknown Has patient had a PCN reaction occurring within the last 10 years: No If all of the above answers are "NO", then may proceed with Cephalosporin use.   Marland Kitchen Percocet [Oxycodone-Acetaminophen] Other (See Comments)    Causes confusion  . Percodan [Oxycodone-Aspirin] Other (See Comments)    Causes confusion  . Vicodin [Hydrocodone-Acetaminophen] Other (See Comments)    Causes confusion      Procedures/Studies: US Renal  Result Date: 09/09/2016 CLINICAL DATA:  Acute kidney injury. EXAM: RENAL / URINARY TRACT ULTRASOUND COMPLETE COMPARISON:  Abdomen  ultrasound dated 08/17/2014. Abdomen and pelvis CT dated 02/04/2014. FINDINGS:  Right Kidney: Length: 9.8 cm. Echogenicity within normal limits. No mass or hydronephrosis visualized. Left Kidney: Length: 9.9 cm. 1.6 cm mid parenchymal cyst. Normal echotexture. No hydronephrosis. Bladder: Mass or tumefactive debris in the dependent portion of the urinary bladder, measuring 5.0 x 3.1 cm in the transverse plane, not as well visualized in the sagittal plane. IMPRESSION: 1. No hydronephrosis. 2. Mass or tumefactive debris in the dependent portion of the urinary bladder. Electronically Signed   By: Claudie Revering M.D.   On: 09/09/2016 01:18   Dg Chest Port 1 View  Result Date: 09/10/2016 CLINICAL DATA:  Cough. EXAM: PORTABLE CHEST 1 VIEW COMPARISON:  09/08/2016 FINDINGS: 1022 hours. The cardio pericardial silhouette is enlarged. Stable asymmetric elevation right hemidiaphragm. Interstitial markings are diffusely coarsened with chronic features. Patient is status post CABG. Left permanent pacemaker again noted. The visualized bony structures of the thorax are intact. Telemetry leads overlie the chest. IMPRESSION: Stable exam.  No new or acute interval findings. Electronically Signed   By: Misty Stanley M.D.   On: 09/10/2016 10:39   Dg Chest Portable 1 View  Result Date: 09/08/2016 CLINICAL DATA:  Shortness of breath. Allergic reaction to antibiotic. Hypotension. EXAM: PORTABLE CHEST 1 VIEW COMPARISON:  One-view chest x-ray 06/27/2015 FINDINGS: The heart is enlarged. Pulmonary vascular congestion is not significantly changed. Aeration is slightly improved. There is no focal airspace disease. CABG is noted. Pacing wires stable. Left neck surgical clips are stable. IMPRESSION: 1. Stable cardiomegaly and pulmonary vascular congestion without frank edema or focal airspace disease. Electronically Signed   By: San Morelle M.D.   On: 09/08/2016 19:21   Dg Abd Portable 1v  Result Date: 09/11/2016 CLINICAL DATA:   Fecal impaction. EXAM: PORTABLE ABDOMEN - 1 VIEW COMPARISON:  Abdomen and pelvis CT dated 02/04/2014. FINDINGS: Normal bowel gas pattern. No visible stool. Lumbar and lower thoracic spine degenerative changes. IMPRESSION: No acute abnormality.  No visible stool. Electronically Signed   By: Claudie Revering M.D.   On: 09/11/2016 13:31      Subjective: No skin rash or pruritus. As per daughter and RN report, multiple BMs overnight after enema. As per RN, large BM this morning. No dyspnea, cough, chest pain, palpitations, abdominal pain, nausea or vomiting. Patient apparently does not like the hospital food and has not eaten much breakfast this morning.  Discharge Exam:  Vitals:   09/10/16 2129 09/11/16 0201 09/11/16 0549 09/11/16 1024  BP: (!) 101/57  (!) 111/58 109/83  Pulse: 77  70 89  Resp: 18  18   Temp: 97.7 F (36.5 C)  98.3 F (36.8 C)   TempSrc: Oral  Oral   SpO2: 96%  96% 98%  Weight: 80.7 kg (178 lb) 80.7 kg (177 lb 14.6 oz)    Height:        General exam: Pleasant elderly female, moderately built and nourished, lying comfortably supine in bed. Mucosa moist now.  Respiratory system: Occasional basal crackles but otherwise clear to auscultation. No increased work of breathing. Cardiovascular system: S1 & S2 heard, RRR. No JVD, murmurs, rubs, gallops or clicks. No pedal edema.  Gastrointestinal system: Abdomen is nondistended, soft and nontender. No organomegaly or masses felt. Normal bowel sounds heard. Central nervous system: Alert and oriented 2 . No focal neurological deficits. Extremities: Symmetric 5 x 5 power. Skin: Skin rash on trunk and thighs has resolved. Psychiatry: Judgement and insight impaired. Mood & affect appropriate.     The results of significant diagnostics from this hospitalization (including  imaging, microbiology, ancillary and laboratory) are listed below for reference.     Microbiology: Recent Results (from the past 240 hour(s))  Culture, blood  (Routine x 2)     Status: None (Preliminary result)   Collection Time: 09/08/16  5:34 PM  Result Value Ref Range Status   Specimen Description BLOOD LEFT ANTECUBITAL  Final   Special Requests   Final    BOTTLES DRAWN AEROBIC AND ANAEROBIC Blood Culture adequate volume   Culture NO GROWTH 3 DAYS  Final   Report Status PENDING  Incomplete  Culture, blood (Routine x 2)     Status: None (Preliminary result)   Collection Time: 09/08/16  6:14 PM  Result Value Ref Range Status   Specimen Description BLOOD LEFT ANTECUBITAL  Final   Special Requests   Final    BOTTLES DRAWN AEROBIC AND ANAEROBIC Blood Culture adequate volume   Culture NO GROWTH 3 DAYS  Final   Report Status PENDING  Incomplete  Urine Culture     Status: Abnormal   Collection Time: 09/08/16  7:00 PM  Result Value Ref Range Status   Specimen Description URINE, CATHETERIZED  Final   Special Requests NONE  Final   Culture (A)  Final    50,000 COLONIES/mL ESCHERICHIA COLI Confirmed Extended Spectrum Beta-Lactamase Producer (ESBL)    Report Status 09/11/2016 FINAL  Final   Organism ID, Bacteria ESCHERICHIA COLI (A)  Final      Susceptibility   Escherichia coli - MIC*    AMPICILLIN >=32 RESISTANT Resistant     CEFAZOLIN >=64 RESISTANT Resistant     CEFTRIAXONE >=64 RESISTANT Resistant     CIPROFLOXACIN >=4 RESISTANT Resistant     GENTAMICIN <=1 SENSITIVE Sensitive     IMIPENEM <=0.25 SENSITIVE Sensitive     NITROFURANTOIN 128 RESISTANT Resistant     TRIMETH/SULFA >=320 RESISTANT Resistant     AMPICILLIN/SULBACTAM 8 SENSITIVE Sensitive     PIP/TAZO 16 SENSITIVE Sensitive     Extended ESBL POSITIVE Resistant     * 50,000 COLONIES/mL ESCHERICHIA COLI     Labs: CBC:  Recent Labs Lab 09/08/16 1751 09/09/16 0257 09/10/16 0227 09/11/16 0743  WBC 10.5 9.4 16.7* 11.1*  NEUTROABS 8.0* 8.7*  --   --   HGB 10.9* 11.2* 10.2* 10.7*  HCT 34.4* 33.6* 30.3* 32.3*  MCV 87.1 85.3 85.6 86.1  PLT 238 246 276 161   Basic  Metabolic Panel:  Recent Labs Lab 09/08/16 1751 09/09/16 0257 09/10/16 0227 09/11/16 0917  NA 131* 131* 134* 137  K 3.1* 3.9 3.7 3.4*  CL 94* 94* 98* 99*  CO2 '24 27 26 27  '$ GLUCOSE 231* 307* 133* 137*  BUN 34* 34* 38* 34*  CREATININE 2.27* 2.15* 1.75* 1.50*  CALCIUM 8.5* 8.4* 8.9 9.0   Liver Function Tests:  Recent Labs Lab 09/08/16 1751  AST 23  ALT 10*  ALKPHOS 97  BILITOT 0.6  PROT 6.6  ALBUMIN 2.3*   BNP (last 3 results)  Recent Labs  06/19/16 1243 09/08/16 1751  BNP 277.6* 306.1*   Cardiac Enzymes:  Recent Labs Lab 09/08/16 2110 09/09/16 0257 09/09/16 1042  TROPONINI 0.06* 0.04* 0.04*   CBG:  Recent Labs Lab 09/10/16 1215 09/10/16 1657 09/10/16 2124 09/11/16 0759 09/11/16 1220  GLUCAP 127* 125* 131* 152* 96   Thyroid function studies  Recent Labs  09/08/16 1751  TSH 1.560   Urinalysis    Component Value Date/Time   COLORURINE AMBER (A) 09/08/2016 1900   APPEARANCEUR TURBID (A)  09/08/2016 1900   LABSPEC 1.008 09/08/2016 1900   PHURINE 5.0 09/08/2016 1900   GLUCOSEU NEGATIVE 09/08/2016 1900   GLUCOSEU NEGATIVE 04/01/2013 1550   HGBUR MODERATE (A) 09/08/2016 1900   BILIRUBINUR NEGATIVE 09/08/2016 1900   KETONESUR NEGATIVE 09/08/2016 1900   PROTEINUR 30 (A) 09/08/2016 1900   UROBILINOGEN 1.0 08/16/2014 1521   NITRITE NEGATIVE 09/08/2016 1900   LEUKOCYTESUR LARGE (A) 09/08/2016 1900    Discussed in detail with patient's daughter at bedside. Updated care and answered questions.  Time coordinating discharge: Over 30 minutes  SIGNED:  Vernell Leep, MD, FACP, [place]. [facility] Pager [phone]  If 7PM-7AM, please contact night-coverage [email or web address] Password [email or web address] 09/11/2016, 3:00 PM

## 2016-09-11 NOTE — Care Management Note (Addendum)
Case Management Note  Patient Details  Name: [name] MRN: [id number] Date of Birth: 1920/04/16  Subjective/Objective:     CM following for progression and d/c planning.                Action/Plan: 09/11/2016 Met with pt and daughter, daughter requesting [facility] services. Pt has previously had HHRN, HHPT, [place], aide and social worker and daughter is requesting these services to be started again with [facility]. Will request orders from MD and notify [facility].  2:40pm Call placed to [facility] for [facility] services. This pt is very familiar to [facility], will fax info for their review by their request.  4pm [facility] notified this CM that they will be to provide [facility] services for this pt with the exception of a Education officer, museum. This CM discussed with pt daughter who is the caregiver and she does not feel that they need a Education officer, museum and wishes to continue with services from [facility]. [facility] notified and d/c summary, demographics and orders with face to face faxed to that agency. Services to begin Tuesday, September 12, 2016.  Per pt daughter she will transport pt to home.   Expected Discharge Date:   09/11/2016               Expected Discharge Plan:  [place]  In-House Referral:  NA  Discharge planning Services  CM Consult  Post Acute Care Choice:  Home Health Choice offered to:  Adult Children  DME Arranged:  N/A DME Agency:  NA  HH Arranged:  HHRN, HHPT, [place], [place] aide. [place] Agency:  [facility]  Status of Service:  In process, will continue to follow  If discussed at Long Length of Stay Meetings, dates discussed:    Additional Comments:  Adron Bene, RN 09/11/2016, 10:10 AM

## 2016-09-11 NOTE — Progress Notes (Signed)
Physical Therapy Treatment Patient Details Name: [name] MRN: [id number] DOB: [birth year] Today's Date: 09/11/2016    History of Present Illness Patient is a 83 yo female admitted 09/08/16 with UTI, hypotension, rash.  Patient also with elevated INR.      PMH:  Legally BLIND, Afib, CAD, MI, CABG, CHF, DM, dementia, CKD, HLD, PPM, HTN    PT Comments    Continuing work on functional mobility and activity tolerance;  Pt tolerated more activity than last session including transfer to commode and then back to bed; on commode, Ms. Krygier described difficulty breathing, chest discomfort, Vitals:  BP 109/83, HR 89, O2 sats 98%; Assisted pt back to bed, no chest discomfort; NT and daughter remained in room, making more efforts to help move bowels  Follow Up Recommendations  Home health PT;Supervision/Assistance - 24 hour     Equipment Recommendations  None recommended by PT    Recommendations for Other Services OT consult (Placed per protocol)     Precautions / Restrictions Precautions Precautions: Fall Precaution Comments: pt is blind    Mobility  Bed Mobility Overal bed mobility: Needs Assistance Bed Mobility: Rolling;Sidelying to Sit;Sit to Sidelying Rolling: Min assist Sidelying to sit: Mod assist     Sit to sidelying: Mod assist General bed mobility comments: Verbal and tactile cues for technique.  Hand-over-hand assist to reach for rail.  Assist to bring hips over to complete roll.  Assist to bring LE's off of bed and to raise trunk to upright position.  Once upright, patient able to maintain balance. Encouraged patient to hold head upright.   Assist to control trunk and bring LE's onto bed to return to sidelying.  Transfers Overall transfer level: Needs assistance Equipment used: 1 person hand held assist;2 person hand held assist Transfers: Sit to/from [facility] Sit to Stand: Max assist Stand pivot transfers: Max assist       General transfer  comment: Employed same technqiue of reaching up for caregiver's neck (in this case, my neck) to pull with UEs and push with LEs to stand; performed pivot to [facility] with little stepping noted; better at taking steps getting from [facility] back to the bed  Ambulation/Gait                 Stairs            Wheelchair Mobility    Modified Rankin (Stroke Patients Only)       Balance     Sitting balance-Leahy Scale: Fair     Standing balance support: Bilateral upper extremity supported Standing balance-Leahy Scale: Poor                              Cognition Arousal/Alertness: Awake/alert Behavior During Therapy: WFL for tasks assessed/performed Overall Cognitive Status: History of cognitive impairments - at baseline                                        Exercises      General Comments        Pertinent Vitals/Pain Pain Assessment: Faces Faces Pain Scale: Hurts little more Pain Location: Discomfort with effort to move bowels Pain Descriptors / Indicators: Aching;Dull Pain Intervention(s): Monitored during session;Other (comment) (got to [facility] in hopes of moving bowels)    Home Living  Prior Function            PT Goals (current goals can now be found in the care plan section) Acute Rehab PT Goals Patient Stated Goal: none stated; Daughter: To get mom home. PT Goal Formulation: With patient/family Time For Goal Achievement: 09/16/16 Potential to Achieve Goals: Fair Progress towards PT goals: Progressing toward goals (slowly)    Frequency    Min 3X/week      PT Plan Current plan remains appropriate    Co-evaluation              AM-PAC PT "6 Clicks" Daily Activity  Outcome Measure  Difficulty turning over in bed (including adjusting bedclothes, sheets and blankets)?: Unable Difficulty moving from lying on back to sitting on the side of the bed? : Unable Difficulty sitting down on and  standing up from a chair with arms (e.g., wheelchair, bedside commode, etc,.)?: Unable Help needed moving to and from a bed to chair (including a wheelchair)?: A Lot Help needed walking in hospital room?: A Lot Help needed climbing 3-5 steps with a railing? : Total 6 Click Score: 8    End of Session Equipment Utilized During Treatment: Gait belt Activity Tolerance: Patient tolerated treatment well Patient left: in bed;with call bell/phone within reach;with family/visitor present Nurse Communication: Mobility status PT Visit Diagnosis: Unsteadiness on feet (R26.81);Muscle weakness (generalized) (M62.81);Difficulty in walking, not elsewhere classified (R26.2);Pain Pain - part of body:  (neck and back)     Time: 7824-2353 PT Time Calculation (min) (ACUTE ONLY): 37 min  Charges:  $Therapeutic Activity: 23-37 mins                    G Codes:       Roney Marion, PT  Acute Rehabilitation Services Pager [phone] Office [phone]    Colletta [place] 09/11/2016, 11:31 AM

## 2016-09-12 ENCOUNTER — Encounter: Payer: Medicare HMO | Admitting: *Deleted

## 2016-09-13 LAB — CULTURE, BLOOD (ROUTINE X 2)
CULTURE: NO GROWTH
Culture: NO GROWTH
SPECIAL REQUESTS: ADEQUATE
Special Requests: ADEQUATE

## 2016-09-13 LAB — POCT INR: INR: 2.6

## 2016-09-14 ENCOUNTER — Telehealth: Payer: Self-pay | Admitting: Pharmacist Clinician (PhC)/ Clinical Pharmacy Specialist

## 2016-09-14 ENCOUNTER — Ambulatory Visit (INDEPENDENT_AMBULATORY_CARE_PROVIDER_SITE_OTHER): Payer: Medicare HMO | Admitting: Pharmacist Clinician (PhC)/ Clinical Pharmacy Specialist

## 2016-09-14 DIAGNOSIS — Z7901 Long term (current) use of anticoagulants: Secondary | ICD-10-CM

## 2016-09-14 NOTE — Telephone Encounter (Signed)
Coumadin letter 

## 2016-09-15 ENCOUNTER — Encounter: Payer: Self-pay | Admitting: Cardiology

## 2016-09-20 ENCOUNTER — Encounter: Payer: Self-pay | Admitting: Cardiology

## 2016-09-20 ENCOUNTER — Ambulatory Visit (INDEPENDENT_AMBULATORY_CARE_PROVIDER_SITE_OTHER): Payer: Medicare HMO | Admitting: Pharmacist

## 2016-09-20 ENCOUNTER — Telehealth: Payer: Self-pay | Admitting: Gastroenterology

## 2016-09-20 ENCOUNTER — Ambulatory Visit (INDEPENDENT_AMBULATORY_CARE_PROVIDER_SITE_OTHER): Payer: Medicare HMO | Admitting: Cardiology

## 2016-09-20 VITALS — BP 82/54 | HR 79 | Ht 60.0 in | Wt 166.5 lb

## 2016-09-20 DIAGNOSIS — I950 Idiopathic hypotension: Secondary | ICD-10-CM | POA: Diagnosis not present

## 2016-09-20 DIAGNOSIS — N183 Chronic kidney disease, stage 3 unspecified: Secondary | ICD-10-CM

## 2016-09-20 DIAGNOSIS — Z87440 Personal history of urinary (tract) infections: Secondary | ICD-10-CM | POA: Diagnosis not present

## 2016-09-20 DIAGNOSIS — Z950 Presence of cardiac pacemaker: Secondary | ICD-10-CM | POA: Diagnosis not present

## 2016-09-20 DIAGNOSIS — Z951 Presence of aortocoronary bypass graft: Secondary | ICD-10-CM

## 2016-09-20 DIAGNOSIS — Z7901 Long term (current) use of anticoagulants: Secondary | ICD-10-CM

## 2016-09-20 DIAGNOSIS — I480 Paroxysmal atrial fibrillation: Secondary | ICD-10-CM | POA: Diagnosis not present

## 2016-09-20 LAB — POCT INR: INR: 2.4

## 2016-09-20 NOTE — Patient Instructions (Addendum)
Medication Instructions: Your physician recommends that you continue on your current medications as directed. Please refer to the Current Medication list given to you today.  OK to stay off Metolazone.  Follow-Up: Your physician wants you to follow-up in: 6 months with Dr. Sallyanne Kuster. You will receive a reminder letter in the mail two months in advance. If you don't receive a letter, please call our office to schedule the follow-up appointment.  If you need a refill on your cardiac medications before your next appointment, please call your pharmacy.

## 2016-09-20 NOTE — Progress Notes (Signed)
09/20/2016 [name]   28-Apr-1920  [id number]  Primary Physician [name], MD Primary Cardiologist: Dr Sallyanne Kuster  HPI:  83 year-old female with a prior history of CABG 1994,  PAF, chronic Coumadin Rx, hypertension, hyperlipidemia, DM II, CKD II-III, and 2nd deg AVB s/p MDT PPM-gen change June 2017. She has chronic hypotension. Echo June 2017 revealed an EF of 45-50% with grade 2 DD. She is in the office today after a recent admission for dehydration with hypotension and acute renal insufficiency secondary to a UTI. Her SCr was elevated on admission,  2.27.  Her INR was also elevated-5.75. This all improved with gentle hydration and adjustment of her ABs. In the office today she is in a wheel chair, slumped over, but conversant. Her B/P is still running low but this has been chronic. B/P reading s from home show a systolic B/P of 062-37. She is not taking her twice a week metolazone and is only taking Lasix 40 mg daily.    Current Outpatient Prescriptions  Medication Sig Dispense Refill  . ACCU-CHEK FASTCLIX LANCETS MISC Use to check sugar 2 times daily 200 each 11  . acetaminophen (TYLENOL) 500 MG tablet Take 500 mg by mouth every 6 (six) hours as needed (pain).    Marland Kitchen acetaminophen-codeine (TYLENOL #3) 300-30 MG tablet Take 1 tablet by mouth every 6 (six) hours as needed for severe pain.  0  . albuterol (PROAIR HFA) 108 (90 Base) MCG/ACT inhaler Inhale 2 puffs into the lungs every 6 (six) hours as needed for wheezing or shortness of breath.    . AMITIZA 24 MCG capsule Take 1 capsule (24 mcg total) by mouth 2 (two) times daily with a meal. 60 capsule 3  . ARGININE PO Take 1 capsule by mouth daily.    Marland Kitchen atropine 1 % ophthalmic solution Place 1 drop into both eyes daily as needed (burning eyes).   0  . Blood Glucose Monitoring Suppl (ACCU-CHEK AVIVA PLUS) w/Device KIT Use to check sugars 2 times daily. 1 kit 0  . docusate sodium (COLACE) 100 MG capsule Take 1 capsule (100 mg total) by  mouth 2 (two) times daily. (Patient taking differently: Take 100 mg by mouth daily as needed for mild constipation. ) 10 capsule 0  . donepezil (ARICEPT) 5 MG tablet Take 5 mg by mouth at bedtime.   0  . folic acid (FOLVITE) 1 MG tablet Take 1 tablet (1 mg total) by mouth daily. 30 tablet 10  . furosemide (LASIX) 40 MG tablet Take 40 mg by mouth in the morning and may take an additional 40 mg if weight increases to 170 pounds overnight    . gabapentin (NEURONTIN) 100 MG capsule Take 100 mg by mouth 2 (two) times daily.     Marland Kitchen glipiZIDE (GLUCOTROL) 5 MG tablet Take 1/2 pill daily (Patient taking differently: Take 2.5 mg by mouth daily. Take 1/2 pill daily) 45 tablet 1  . glucose blood (ACCU-CHEK AVIVA PLUS) test strip Use as instructed to check sugar 2 times daily. 200 each 11  . guaifenesin (MUCUS RELIEF CHEST CONGESTION) 400 MG TABS tablet Take 400 mg by mouth 2 (two) times daily as needed (cough/ congestion).    Marland Kitchen levalbuterol (XOPENEX) 0.63 MG/3ML nebulizer solution Take 3 mLs by nebulization 3 (three) times daily as needed for wheezing or shortness of breath.   0  . levothyroxine (SYNTHROID) 125 MCG tablet Take 0.5 tablets (62.5 mcg total) by mouth daily. 45 tablet 1  .  loratadine (CLARITIN) 10 MG tablet Take 10 mg by mouth daily.    . magnesium citrate SOLN Take 1 Bottle by mouth every 7 (seven) days.    . meclizine (ANTIVERT) 25 MG tablet Take 25 mg by mouth 3 (three) times daily as needed for dizziness.    Marland Kitchen ofloxacin (OCUFLOX) 0.3 % ophthalmic solution Place 1 drop into both eyes 4 (four) times daily as needed (for drainage).   0  . omeprazole (PRILOSEC) 40 MG capsule Take 1 capsule (40 mg total) by mouth 2 (two) times daily. 60 capsule 3  . OVER THE COUNTER MEDICATION Take 1 Can by mouth daily. Enterex nutritional supplement - with choline    . OVER THE COUNTER MEDICATION Kidney supplement capsules (Choline 100 mg, trimethylglycine 50 mg, glutathione 50 mg, methyltetrahydrofolate,  l-arginine-, l-ornithine, l-citruline): Take 1 capsule by mouth two to three times a week    . Polyvinyl Alcohol-Povidone (REFRESH OP) Place 1 drop into both eyes daily as needed (dry eyes).     . Potassium Chloride CR (MICRO-K) 8 MEQ CPCR capsule CR Take 1 capsule (8 mEq total) by mouth daily. 90 capsule 1  . rosuvastatin (CRESTOR) 20 MG tablet Take 1 tablet (20 mg total) by mouth daily. 90 tablet 3  . traMADol (ULTRAM) 50 MG tablet Take 1 tablet (50 mg total) by mouth 2 (two) times daily as needed for moderate pain.    Marland Kitchen warfarin (COUMADIN) 3 MG tablet Take 1 tablet (3 mg total) by mouth daily at 6 PM. Or as directed by coumadin clinic.    Marland Kitchen zolpidem (AMBIEN) 10 MG tablet Take 5-10 mg by mouth at bedtime as needed for sleep.   0   No current facility-administered medications for this visit.     Allergies  Allergen Reactions  . Macrobid [Nitrofurantoin] Rash and Cough    Wheezing (also) THIS REACTION RESULTED IN THE PATIENT ENDING UP IN THE E.D.  . Darvon Other (See Comments)    Causes confusion  . Digoxin And Related Nausea Only  . Penicillins Other (See Comments)    From childhood: Has patient had a PCN reaction causing immediate rash, facial/tongue/throat swelling, SOB or lightheadedness with hypotension: Unknown Has patient had a PCN reaction causing severe rash involving mucus membranes or skin necrosis: Unknown Has patient had a PCN reaction that required hospitalization: Unknown Has patient had a PCN reaction occurring within the last 10 years: No If all of the above answers are "NO", then may proceed with Cephalosporin use.   Marland Kitchen Percocet [Oxycodone-Acetaminophen] Other (See Comments)    Causes confusion  . Percodan [Oxycodone-Aspirin] Other (See Comments)    Causes confusion  . Vicodin [Hydrocodone-Acetaminophen] Other (See Comments)    Causes confusion    Past Medical History:  Diagnosis Date  . 2nd degree atrioventricular block    a. 06/22/15 Gen change: MDT ADDR01  Adapta, DC PPM (ser# [id number]).  . Allergy to perfume   . Anemia   . Arthritis   . Chronic combined systolic and diastolic CHF (congestive heart failure) ([place])    a. 01/2014 Echo: EF @ least mod-sev reduced with HK of lat/apical, basal inf walls. basalpost AK, Gr 1DD; b. 06/2015 Echo: EF 45-50%, Gr2 DD, mod LVH.   . CKD (chronic kidney disease), stage III    a. iii - iv.  . Coronary artery disease    a. 1994 s/p cabg;  b. 09/2013 MV: large, sev intensity, partially reversible inf, apical defect, prior inf/ap infarct w/ mild peri-infarct ischemia->Med  Rx; c. 06/2015 NSTEMI (trop 6)->Med rx.  . Daily headache   . DVT of upper extremity (deep vein thrombosis) ([place]) 06/13/2012   BUE  . Dyspepsia   . Gastric ulcer   . GERD (gastroesophageal reflux disease)   . H/O: GI bleed 12//13  . Hiatal hernia   . High cholesterol   . History of blood transfusion 2013  . Hypertensive heart disease   . Hypothyroid   . Legally blind   . PAF (paroxysmal atrial fibrillation) ([facility])    a. CHA2DS2VASc = 7-->coumadin.  . Presence of permanent cardiac pacemaker    a. 06/22/15 Gen change: MDT ADDR01 Adapta, DC PPM (ser# [id number]).  . Seasonal allergies   . Type II diabetes mellitus ([place])   . UTI (lower urinary tract infection)     Social History   Social History  . Marital status: Widowed    Spouse name: N/A  . Number of children: 10  . Years of education: N/A   Occupational History  .  Retired   Social History Main Topics  . Smoking status: Never Smoker  . Smokeless tobacco: Never Used  . Alcohol use No  . Drug use: No  . Sexual activity: No   Other Topics Concern  . Not on file   Social History Narrative  . No narrative on file     Family History  Problem Relation Age of Onset  . Heart disease Father   . Diabetes Father   . Breast cancer Daughter   . Diabetes Son   . Diabetes Daughter   . Colon polyps Daughter   . Heart attack Brother   . Diabetes Brother   . Stroke Sister   .  Colon cancer Neg Hx      Review of Systems: General: negative for chills, fever, night sweats or weight changes.  Cardiovascular: negative for chest pain, dyspnea on exertion, edema, orthopnea, palpitations, paroxysmal nocturnal dyspnea or shortness of breath Dermatological: negative for rash Respiratory: negative for cough or wheezing Urologic: negative for hematuria Abdominal: negative for nausea, vomiting, diarrhea, bright red blood per rectum, melena, or hematemesis Neurologic: negative for visual changes, syncope, or dizziness All other systems reviewed and are otherwise negative except as noted above.    Blood pressure (!) 82/54, pulse 79, height 5' (1.524 m), weight 166 lb 8 oz (75.5 kg).  General appearance: cooperative, no distress, mildly obese and in wheel chair Lungs: kyphosis, basilar crackles Heart: regular rate and rhythm Extremities: no edema Neurologic: Grossly normal   ASSESSMENT AND PLAN:   UTI  With acute on CRI, hypotension, and elevated INR-  2Nd degree atrioventricular block MDT PTVDP with generator change June 2017  H/O PAF- chronic Coumadin anticoagulation-INR 2.6 on 09/13/16  Elevated troponin I level Post generator change June 2017- medical Rx only  Hypotension Chronic- systolic in 14'E  CKD (chronic kidney disease), stage III Last SCr 1.5 on 09/11/16  Hx of CABG-CABG in 1994. Myoview abnormal Aug 2015- medical Rx Recent NSTEMI by Troponin, atypical chest pain- medical Rx  DNR (do not resuscitate) discussion Significantly debilitated, wheel chair bound.  PLAN  I encourage the family to make sure she stays hydrated. Dr Sallyanne Kuster to see in 6 months.   Kerin Ransom PA-C 09/20/2016 12:20 PM

## 2016-09-20 NOTE — Telephone Encounter (Signed)
No answer. Voice mail.

## 2016-09-22 NOTE — Telephone Encounter (Signed)
No solid stools. Unsure if she is constipated. She is taking twice daily Amitiza. She has difficulty "pushing" her stool out and mostly passes just liquid. The daughter says if she massages the rectum for her mother, the mother can pass some soft formed stool. No abdominal pain. No nausea or fever. Suggestions? Daughter wants to know if mag citrate will help.

## 2016-09-22 NOTE — Telephone Encounter (Signed)
Discussed with the daughter. Explained how to use the suppository. She will call back with any problems.

## 2016-09-22 NOTE — Telephone Encounter (Signed)
Please advise Dulcolax suppository per rectum every 2 days to help evacuate better.

## 2016-09-26 ENCOUNTER — Ambulatory Visit (INDEPENDENT_AMBULATORY_CARE_PROVIDER_SITE_OTHER): Payer: Medicare HMO | Admitting: *Deleted

## 2016-09-26 DIAGNOSIS — I442 Atrioventricular block, complete: Secondary | ICD-10-CM

## 2016-09-26 NOTE — Progress Notes (Signed)
Remote pacemaker transmission.   

## 2016-09-27 ENCOUNTER — Encounter: Payer: Self-pay | Admitting: Cardiology

## 2016-09-27 LAB — CUP PACEART REMOTE DEVICE CHECK
Battery Impedance: 100 Ohm
Battery Remaining Longevity: 126 mo
Battery Voltage: 2.79 V
Brady Statistic AP VS Percent: 0 %
Brady Statistic AS VP Percent: 96 %
Date Time Interrogation Session: 20180910172146
Implantable Lead Implant Date: 20080613
Implantable Lead Implant Date: 20080613
Implantable Lead Location: 753859
Implantable Lead Model: 4092
Implantable Lead Model: 5076
Implantable Pulse Generator Implant Date: 20170606
Lead Channel Pacing Threshold Amplitude: 1 V
Lead Channel Pacing Threshold Pulse Width: 0.4 ms
Lead Channel Pacing Threshold Pulse Width: 0.4 ms
Lead Channel Setting Pacing Amplitude: 2 V
Lead Channel Setting Sensing Sensitivity: 5.6 mV
MDC IDC LEAD LOCATION: 753860
MDC IDC MSMT LEADCHNL RA IMPEDANCE VALUE: 436 Ohm
MDC IDC MSMT LEADCHNL RV IMPEDANCE VALUE: 615 Ohm
MDC IDC MSMT LEADCHNL RV PACING THRESHOLD AMPLITUDE: 0.875 V
MDC IDC SET LEADCHNL RV PACING AMPLITUDE: 2.5 V
MDC IDC SET LEADCHNL RV PACING PULSEWIDTH: 0.4 ms
MDC IDC STAT BRADY AP VP PERCENT: 4 %
MDC IDC STAT BRADY AS VS PERCENT: 0 %

## 2016-10-01 ENCOUNTER — Other Ambulatory Visit: Payer: Self-pay | Admitting: Gastroenterology

## 2016-10-04 LAB — POCT INR: INR: 2.2

## 2016-10-05 ENCOUNTER — Ambulatory Visit (INDEPENDENT_AMBULATORY_CARE_PROVIDER_SITE_OTHER): Payer: Medicare HMO | Admitting: Pharmacist

## 2016-10-05 DIAGNOSIS — Z7901 Long term (current) use of anticoagulants: Secondary | ICD-10-CM | POA: Diagnosis not present

## 2016-10-13 ENCOUNTER — Ambulatory Visit (INDEPENDENT_AMBULATORY_CARE_PROVIDER_SITE_OTHER): Payer: Medicare HMO | Admitting: Endocrinology

## 2016-10-13 ENCOUNTER — Encounter: Payer: Self-pay | Admitting: Endocrinology

## 2016-10-13 VITALS — BP 108/68 | HR 72

## 2016-10-13 DIAGNOSIS — E1165 Type 2 diabetes mellitus with hyperglycemia: Secondary | ICD-10-CM | POA: Diagnosis not present

## 2016-10-13 LAB — POCT GLYCOSYLATED HEMOGLOBIN (HGB A1C): HEMOGLOBIN A1C: 7.8

## 2016-10-13 LAB — GLUCOSE, POCT (MANUAL RESULT ENTRY): POC GLUCOSE: 117 mg/dL — AB (ref 70–99)

## 2016-10-13 NOTE — Progress Notes (Signed)
  Patient ID: [name], female   DOB: 04/04/1920, 83 y.o.   MRN: [id number]   Reason for Appointment: Diabetes follow-up   History of Present Illness   Diagnosis: Type 2 diabetes mellitus, date of diagnosis: 1972.   She had been on insulin for several years and this had been tapered off in 2013 because of weight loss and lower blood sugars. Subsequently blood sugars had been generally reasonably good.  Because of her age and multiple medical problems she has been monitored without insulin  Recent history:  Her last A1c was 6.3 in February and is now 7.8  Because of high cost she cannot afford  Januvia and she was started back on glipizide 2.5 mg before supper in 12/17 Previously had done well with Januvia   Current management, blood sugar patterns and problems:  She did not bring her monitor for download   Her daughter does not remember her exact readings but is checking mostly before breakfast and suppertime  She tends to have fairly close to normal fasting and relatively high readings around suppertime but her daughter does not remember any readings after supper, these are checked sporadically  No reported hypoglycemia with taking glipizide consistently before dinnertime  Her daughters think that she is usually eating fairly well with her meals  She also usually has a good breakfast but not high fat and not clear why her blood sugars are higher several hours later at suppertime  No readings after breakfast  She will occasionally will have small amount of regular soft drink such as lemonade, have discussed that this is okay   Also sometimes will have Glucerna if she does not eat  Her weight has come down    Recent blood sugar readings by : Recall  PRE-MEAL Fasting Lunch Dinner Bedtime Overall  Glucose range: 98-135  150-220 ?   Mean/median:         Meals: 2 meals at about noon and 7 pm; sometimes has lemonade or sprite. Has small portions, has some  grapes for snacks in the afternoon  Dietician visit: Most recent:2001.     Wt Readings from Last 3 Encounters:  09/20/16 166 lb 8 oz (75.5 kg)  09/11/16 177 lb 14.6 oz (80.7 kg)  03/23/16 175 lb (79.4 kg)   Lab Results  Component Value Date   HGBA1C 7.8 10/13/2016   HGBA1C 6.3 03/02/2016   HGBA1C 6.0 12/01/2015   Lab Results  Component Value Date   LDLCALC 141 (H) 10/25/2015   CREATININE 1.50 (H) 09/11/2016   Lab Results  Component Value Date   FRUCTOSAMINE 371 (H) 07/07/2015   FRUCTOSAMINE 274 (H) 03/24/2013     Allergies as of 10/13/2016      Reactions   Macrobid [nitrofurantoin] Rash, Cough   Wheezing (also) THIS REACTION RESULTED IN THE PATIENT ENDING UP IN THE E.D.   Darvon Other (See Comments)   Causes confusion   Digoxin And Related Nausea Only   Penicillins Other (See Comments)   From childhood: Has patient had a PCN reaction causing immediate rash, facial/tongue/throat swelling, SOB or lightheadedness with hypotension: Unknown Has patient had a PCN reaction causing severe rash involving mucus membranes or skin necrosis: Unknown Has patient had a PCN reaction that required hospitalization: Unknown Has patient had a PCN reaction occurring within the last 10 years: No If all of the above answers are "NO", then may proceed with Cephalosporin use.   Percocet [oxycodone-acetaminophen] Other (See Comments)   Causes confusion     Percodan [oxycodone-aspirin] Other (See Comments)   Causes confusion   Vicodin [hydrocodone-acetaminophen] Other (See Comments)   Causes confusion      Medication List       Accurate as of 10/13/16 11:59 PM. Always use your most recent med list.          ACCU-CHEK AVIVA PLUS w/Device Kit Use to check sugars 2 times daily.   ACCU-CHEK FASTCLIX LANCETS Misc Use to check sugar 2 times daily   acetaminophen 500 MG tablet Commonly known as:  TYLENOL Take 500 mg by mouth every 6 (six) hours as needed (pain).   acetaminophen-codeine  300-30 MG tablet Commonly known as:  TYLENOL #3 Take 1 tablet by mouth every 6 (six) hours as needed for severe pain.   AMITIZA 24 MCG capsule Generic drug:  lubiprostone Take 1 capsule (24 mcg total) by mouth 2 (two) times daily with a meal.   AMITIZA 8 MCG capsule Generic drug:  lubiprostone take 1 capsule by mouth twice a day with food   ARGININE PO Take 1 capsule by mouth daily.   atropine 1 % ophthalmic solution Place 1 drop into both eyes daily as needed (burning eyes).   docusate sodium 100 MG capsule Commonly known as:  COLACE Take 1 capsule (100 mg total) by mouth 2 (two) times daily.   donepezil 5 MG tablet Commonly known as:  ARICEPT Take 5 mg by mouth at bedtime.   folic acid 1 MG tablet Commonly known as:  FOLVITE Take 1 tablet (1 mg total) by mouth daily.   furosemide 40 MG tablet Commonly known as:  LASIX Take 40 mg by mouth in the morning and may take an additional 40 mg if weight increases to 170 pounds overnight   gabapentin 100 MG capsule Commonly known as:  NEURONTIN Take 100 mg by mouth 2 (two) times daily.   glipiZIDE 5 MG tablet Commonly known as:  GLUCOTROL Take 1/2 pill daily   glucose blood test strip Commonly known as:  ACCU-CHEK AVIVA PLUS Use as instructed to check sugar 2 times daily.   levalbuterol 0.63 MG/3ML nebulizer solution Commonly known as:  XOPENEX Take 3 mLs by nebulization 3 (three) times daily as needed for wheezing or shortness of breath.   levothyroxine 125 MCG tablet Commonly known as:  SYNTHROID Take 0.5 tablets (62.5 mcg total) by mouth daily.   loratadine 10 MG tablet Commonly known as:  CLARITIN Take 10 mg by mouth daily.   magnesium citrate Soln Take 1 Bottle by mouth every 7 (seven) days.   meclizine 25 MG tablet Commonly known as:  ANTIVERT Take 25 mg by mouth 3 (three) times daily as needed for dizziness.   MUCUS RELIEF CHEST CONGESTION 400 MG Tabs tablet Generic drug:  guaifenesin Take 400 mg by  mouth 2 (two) times daily as needed (cough/ congestion).   ofloxacin 0.3 % ophthalmic solution Commonly known as:  OCUFLOX Place 1 drop into both eyes 4 (four) times daily as needed (for drainage).   omeprazole 40 MG capsule Commonly known as:  PRILOSEC Take 1 capsule (40 mg total) by mouth 2 (two) times daily.   OVER THE COUNTER MEDICATION Take 1 Can by mouth daily. Enterex nutritional supplement - with choline   OVER THE COUNTER MEDICATION Kidney supplement capsules (Choline 100 mg, trimethylglycine 50 mg, glutathione 50 mg, methyltetrahydrofolate, l-arginine-, l-ornithine, l-citruline): Take 1 capsule by mouth two to three times a week   Potassium Chloride CR 8 MEQ Cpcr capsule CR Commonly known  as:  MICRO-K Take 1 capsule (8 mEq total) by mouth daily.   PROAIR HFA 108 (90 Base) MCG/ACT inhaler Generic drug:  albuterol Inhale 2 puffs into the lungs every 6 (six) hours as needed for wheezing or shortness of breath.   REFRESH OP Place 1 drop into both eyes daily as needed (dry eyes).   rosuvastatin 20 MG tablet Commonly known as:  CRESTOR Take 1 tablet (20 mg total) by mouth daily.   traMADol 50 MG tablet Commonly known as:  ULTRAM Take 1 tablet (50 mg total) by mouth 2 (two) times daily as needed for moderate pain.   warfarin 3 MG tablet Commonly known as:  COUMADIN Take 1 tablet (3 mg total) by mouth daily at 6 PM. Or as directed by coumadin clinic.   zolpidem 10 MG tablet Commonly known as:  AMBIEN Take 5-10 mg by mouth at bedtime as needed for sleep.            Discharge Care Instructions        Start     Ordered   10/13/16 0000  POCT glucose (manual entry)     10/13/16 1404   10/13/16 0000  POCT glycosylated hemoglobin (Hb A1C)     10/13/16 1404      Past Medical History:  Diagnosis Date  . 2nd degree atrioventricular block    a. 06/22/15 Gen change: MDT ADDR01 Adapta, DC PPM (ser# [id number]).  . Allergy to perfume   . Anemia   . Arthritis   .  Chronic combined systolic and diastolic CHF (congestive heart failure) (HCC)    a. 01/2014 Echo: EF @ least mod-sev reduced with HK of lat/apical, basal inf walls. basalpost AK, Gr 1DD; b. 06/2015 Echo: EF 45-50%, Gr2 DD, mod LVH.   . CKD (chronic kidney disease), stage III    a. iii - iv.  . Coronary artery disease    a. 1994 s/p cabg;  b. 09/2013 MV: large, sev intensity, partially reversible inf, apical defect, prior inf/ap infarct w/ mild peri-infarct ischemia->Med Rx; c. 06/2015 NSTEMI (trop 6)->Med rx.  . Daily headache   . DVT of upper extremity (deep vein thrombosis) (HCC) 06/13/2012   BUE  . Dyspepsia   . Gastric ulcer   . GERD (gastroesophageal reflux disease)   . H/O: GI bleed 12//13  . Hiatal hernia   . High cholesterol   . History of blood transfusion 2013  . Hypertensive heart disease   . Hypothyroid   . Legally blind   . PAF (paroxysmal atrial fibrillation) (HCC)    a. CHA2DS2VASc = 7-->coumadin.  . Presence of permanent cardiac pacemaker    a. 06/22/15 Gen change: MDT ADDR01 Adapta, DC PPM (ser# [id number]).  . Seasonal allergies   . Type II diabetes mellitus (HCC)   . UTI (lower urinary tract infection)     Past Surgical History:  Procedure Laterality Date  . CARDIAC CATHETERIZATION  10/25/92  . CARDIAC CATHETERIZATION  11/18/03   w/grafts 100%CX LAD 80 & 100%  . CARDIAC CATHETERIZATION  01/24/05   diffuse disease of native vessels  . CARDIAC CATHETERIZATION  06/06/06   severe native CAD  . CARDIOVERSION  06/06/06   successful  . CATARACT EXTRACTION W/ INTRAOCULAR LENS  IMPLANT, BILATERAL Bilateral   . CORONARY ARTERY BYPASS GRAFT  10/27/92   LIMA to LAD,SVG to LAD second diagonal,obtuse maraginal of the CX and posterior descendingbranch of the RCA  . EP IMPLANTABLE DEVICE N/A 06/22/2015   Procedure: PPM/BIV PPM   Nature conservation officer;  Surgeon: Sanda Klein, MD;  Location: [facility];  Service: Cardiovascular;  Laterality: N/A;  . ESOPHAGOGASTRODUODENOSCOPY   12/22/2011   Procedure: ESOPHAGOGASTRODUODENOSCOPY (EGD);  Surgeon: Gatha Mayer, MD;  Location: [facility];  Service: Endoscopy;  Laterality: N/A;  . INSERT / REPLACE / REMOVE PACEMAKER  06/29/2006   Medtronic adapta  . REFRACTIVE SURGERY Bilateral     Family History  Problem Relation Age of Onset  . Heart disease Father   . Diabetes Father   . Breast cancer Daughter   . Diabetes Son   . Diabetes Daughter   . Colon polyps Daughter   . Heart attack Brother   . Diabetes Brother   . Stroke Sister   . Colon cancer Neg Hx     Social History:  reports that she has never smoked. She has never used smokeless tobacco. She reports that she does not drink alcohol or use drugs.  Allergies:  Allergies  Allergen Reactions  . Macrobid [Nitrofurantoin] Rash and Cough    Wheezing (also) THIS REACTION RESULTED IN THE PATIENT ENDING UP IN THE E.D.  . Darvon Other (See Comments)    Causes confusion  . Digoxin And Related Nausea Only  . Penicillins Other (See Comments)    From childhood: Has patient had a PCN reaction causing immediate rash, facial/tongue/throat swelling, SOB or lightheadedness with hypotension: Unknown Has patient had a PCN reaction causing severe rash involving mucus membranes or skin necrosis: Unknown Has patient had a PCN reaction that required hospitalization: Unknown Has patient had a PCN reaction occurring within the last 10 years: No If all of the above answers are "NO", then may proceed with Cephalosporin use.   Marland Kitchen Percocet [Oxycodone-Acetaminophen] Other (See Comments)    Causes confusion  . Percodan [Oxycodone-Aspirin] Other (See Comments)    Causes confusion  . Vicodin [Hydrocodone-Acetaminophen] Other (See Comments)    Causes confusion    REVIEW of systems:  Hypothyroidism: She is taking levothyroxine  62.5 mcg daily  TSH in the hospital recently was normal   Lab Results  Component Value Date   TSH 1.560 09/08/2016   TSH 1.68 03/02/2016   TSH 2.02  12/01/2015   FREET4 0.70 06/25/2014   FREET4 0.65 06/24/2013   FREET4 0.93 01/24/2013     She has had renal dysfunction which is Being stable   Lab Results  Component Value Date   CREATININE 1.50 (H) 09/11/2016    She has significant neuropathy with sensory loss on previous  foot exam.   She has had hyperlipidemia Reportedly followed by PCP Since her LDL was significantly high at 132 on the last measurement No recent labs available from PCP office   Lab Results  Component Value Date   CHOL 222 (H) 12/01/2015   HDL 32.20 (L) 12/01/2015   LDLCALC 141 (H) 10/25/2015   LDLDIRECT 132.0 03/02/2016   TRIG 244.0 (H) 12/01/2015   CHOLHDL 7 12/01/2015      Examination:   BP 108/68   Pulse 72   SpO2 93%   There is no height or weight on file to calculate BMI.   Patient is somewhat somnolent most of the exam  Assesment/plan:   1.  Diabetes type 2, with obesity, long-standing   See history of present illness for  discussion of current diabetes management, blood sugar patterns and problems identified  Her A1c is unusually higher at 7.8, previously near normal Reports her blood sugars to be relatively higher before supper time but  does not check postprandial readings either morning or evening However has blood sugars only about 200 and not significantly high in the late afternoon  Since it is not clear whether she is having high readings after breakfast would not add another dose of glipizide in the morning unless blood sugars are consistently high Also for avoiding potential hypoglycemia she will continue on the low dose glipizide at suppertime  Her daughter will bring her monitor for download and further review Discussed that at her age and with multiple comorbid conditions and A1c under 8% is acceptable  We will increase her medication regimen if her blood sugars are consistently going up or around 300 Also have not asked her to restrict her diet   2.  Hypothyroidism:  Most recent TSH normal   , 10/15/2016, 4:12 PM        . 

## 2016-10-13 NOTE — Patient Instructions (Signed)
Potassium 2 daily

## 2016-10-19 ENCOUNTER — Ambulatory Visit (INDEPENDENT_AMBULATORY_CARE_PROVIDER_SITE_OTHER): Payer: Medicare HMO | Admitting: Pharmacist

## 2016-10-19 DIAGNOSIS — Z7901 Long term (current) use of anticoagulants: Secondary | ICD-10-CM | POA: Diagnosis not present

## 2016-10-19 LAB — POCT INR: INR: 2

## 2016-10-23 ENCOUNTER — Other Ambulatory Visit: Payer: Self-pay | Admitting: Cardiovascular Disease

## 2016-11-01 ENCOUNTER — Ambulatory Visit (INDEPENDENT_AMBULATORY_CARE_PROVIDER_SITE_OTHER): Payer: Medicare HMO | Admitting: Pharmacist Clinician (PhC)/ Clinical Pharmacy Specialist

## 2016-11-01 DIAGNOSIS — I482 Chronic atrial fibrillation, unspecified: Secondary | ICD-10-CM

## 2016-11-01 DIAGNOSIS — Z7901 Long term (current) use of anticoagulants: Secondary | ICD-10-CM

## 2016-11-01 LAB — POCT INR: INR: 2.1

## 2016-11-14 ENCOUNTER — Telehealth: Payer: Self-pay | Admitting: Cardiovascular Disease

## 2016-11-14 NOTE — Telephone Encounter (Signed)
Spoke to daughter she state she will have to call backk later when she get home to give all details of patient's weight

## 2016-11-14 NOTE — Telephone Encounter (Signed)
Left message to call back  

## 2016-11-14 NOTE — Telephone Encounter (Signed)
Per pt's daughter call  Fluid pill  FLOROSAMIDE ? 80mg   Today her weight is 172 lb daughter would like to know if to increase medication.

## 2016-11-15 NOTE — Telephone Encounter (Signed)
Left detailed message ok to give extra lasix as per directions on bottle and ok to take for 2 days call back if SOB or weight is still above 170#

## 2016-11-15 NOTE — Telephone Encounter (Signed)
Follow up      Patient daughter [name] calling back - Weight today 171.5

## 2016-11-17 ENCOUNTER — Telehealth: Payer: Self-pay | Admitting: Cardiovascular Disease

## 2016-11-17 DIAGNOSIS — I10 Essential (primary) hypertension: Secondary | ICD-10-CM

## 2016-11-17 NOTE — Telephone Encounter (Signed)
New message    Patient daughter calling back to clarify instructions left on voicemail 10/31 regarding increasing Lasix.  Please call back  Patient weight on 11/1 was 170lbs

## 2016-11-17 NOTE — Telephone Encounter (Signed)
Returned call to patient's daughter.She stated mother's weight 170 lbs today.No swelling.No sob. Stated she increased her lasix to 40 mg twice a day on 11/08/16.She will decrease back to 40 mg daily.She will bring her to office today for a bmet.

## 2016-11-28 ENCOUNTER — Encounter: Payer: Self-pay | Admitting: Cardiovascular Disease

## 2016-11-28 ENCOUNTER — Ambulatory Visit (INDEPENDENT_AMBULATORY_CARE_PROVIDER_SITE_OTHER): Payer: Medicare HMO | Admitting: Pharmacist Clinician (PhC)/ Clinical Pharmacy Specialist

## 2016-11-28 ENCOUNTER — Ambulatory Visit: Payer: Medicare HMO | Admitting: Cardiovascular Disease

## 2016-11-28 VITALS — BP 84/55 | HR 73 | Ht 60.0 in | Wt 171.0 lb

## 2016-11-28 DIAGNOSIS — Z7901 Long term (current) use of anticoagulants: Secondary | ICD-10-CM

## 2016-11-28 DIAGNOSIS — I480 Paroxysmal atrial fibrillation: Secondary | ICD-10-CM | POA: Diagnosis not present

## 2016-11-28 DIAGNOSIS — I5042 Chronic combined systolic (congestive) and diastolic (congestive) heart failure: Secondary | ICD-10-CM | POA: Diagnosis not present

## 2016-11-28 DIAGNOSIS — Z950 Presence of cardiac pacemaker: Secondary | ICD-10-CM | POA: Diagnosis not present

## 2016-11-28 DIAGNOSIS — I25708 Atherosclerosis of coronary artery bypass graft(s), unspecified, with other forms of angina pectoris: Secondary | ICD-10-CM

## 2016-11-28 DIAGNOSIS — I442 Atrioventricular block, complete: Secondary | ICD-10-CM | POA: Diagnosis not present

## 2016-11-28 DIAGNOSIS — E0859 Diabetes mellitus due to underlying condition with other circulatory complications: Secondary | ICD-10-CM

## 2016-11-28 DIAGNOSIS — E782 Mixed hyperlipidemia: Secondary | ICD-10-CM | POA: Diagnosis not present

## 2016-11-28 LAB — POCT INR: INR: 1.4

## 2016-11-28 MED ORDER — FUROSEMIDE 20 MG PO TABS: ORAL_TABLET | ORAL | 3 refills | Status: DC

## 2016-11-28 NOTE — Patient Instructions (Signed)
Medication Instructions: Dr Sallyanne Kuster has recommended making the following medication changes: 1. DECREASE Furosemide to 20 mg daily  Weigh daily. If weight is 175 pounds or more, take 40 mg of Lasix, until weight drops back down to 175 pounds or less. Call [phone] if weight climbs more than 3 pounds in a day or 5 pounds in a week. No salt to very little salt in your diet.  No more than 2000 mg in a day. Call if increased shortness of breath or increased swelling.  Labwork: Your physician recommends that you return for lab work in 2 weeks (at your convenience).  Testing/Procedures: NONE ORDERED  Follow-up: Your physician recommends that you schedule a follow-up appointment in 4-6 weeks with a PA/NP.  Dr Sallyanne Kuster recommends that you schedule a follow-up appointment first available.  If you need a refill on your cardiac medications before your next appointment, please call your pharmacy.

## 2016-11-28 NOTE — Progress Notes (Signed)
Cardiology Office Note    Date:  11/28/2016   ID:  [name], DOB March 23, 1920, MRN [id number]  PCP:  [name], MD  Cardiologist:   Sanda Klein, MD   Chief complaint: low BP   History of Present Illness:  [name] is a 83 y.o. female with a history of CAD s/p CABG 7628, combined systolic and diastolic heart failure, paroxysmal atrial fibrillation on warfarin chronic anticoagulationhyperlipidemia, type 2 diabetes mellitus complicated by retinopathy and nephropathy, chronic kidney disease stage III, complete heart block s/p dual-chamber permanent pacemaker (Medtronic Adapta implanted 2017, generator change after initial implantation 2008).  Her typical blood pressure is in the 80/50 range, occasionally with systolic in the 31D.  She is not taking any antihypertensive medications except for furosemide.  Glucose has often been in the 200s.  As before she intermittently complains of discomfort in her upper chest and throat at rest.  She is unable to take any antianginal medications due to her low blood pressure..  She continues to weigh with her wheelchair on the special scale at home.  Pretty consistently she is around 170-171 pounds (add 35 pounds for her wheelchair).  She is no longer receiving home health nursing.  Her primary care doctor is trying to recertify her for home health.  She was last briefly hospitalized in August with an E. Coli  urinary tract infection; at that time her nitroglycerin patches were discontinued due to hypotension.  She was hypokalemic on admission with a serum creatinine that decreased from 2.27 on arrival to 1.5 by discharge. No cardiovascular events since her hospitalization with a small NSTEMI in July, treated conservatively in view of advanced age and renal insufficiency. The ventricular systolic function was unchanged at 45-50%.   She does have a history of GI bleeding, none recently.   Pacemaker interrogation in September shows  normal device function.  She has infrequent and brief episodes of paroxysmal atrial fibrillation, the longest one in the last 12 months measuring only 21 minutes.  She has had occasional nonsustained VT.  She is pacemaker dependent with 100% ventricular paced rhythm.   Past Medical History:  Diagnosis Date  . 2nd degree atrioventricular block    a. 06/22/15 Gen change: MDT ADDR01 Adapta, DC PPM (ser# [id number]).  . Allergy to perfume   . Anemia   . Arthritis   . Chronic combined systolic and diastolic CHF (congestive heart failure) ([place])    a. 01/2014 Echo: EF @ least mod-sev reduced with HK of lat/apical, basal inf walls. basalpost AK, Gr 1DD; b. 06/2015 Echo: EF 45-50%, Gr2 DD, mod LVH.   . CKD (chronic kidney disease), stage III ([place])    a. iii - iv.  . Coronary artery disease    a. 1994 s/p cabg;  b. 09/2013 MV: large, sev intensity, partially reversible inf, apical defect, prior inf/ap infarct w/ mild peri-infarct ischemia->Med Rx; c. 06/2015 NSTEMI (trop 6)->Med rx.  . Daily headache   . DVT of upper extremity (deep vein thrombosis) ([place]) 06/13/2012   BUE  . Dyspepsia   . Gastric ulcer   . GERD (gastroesophageal reflux disease)   . H/O: GI bleed 12//13  . Hiatal hernia   . High cholesterol   . History of blood transfusion 2013  . Hypertensive heart disease   . Hypothyroid   . Legally blind   . PAF (paroxysmal atrial fibrillation) (HCC)    a. CHA2DS2VASc = 7-->coumadin.  . Presence of permanent cardiac pacemaker  a. 06/22/15 Gen change: MDT ADDR01 Adapta, DC PPM (ser# [id number]).  . Seasonal allergies   . Type II diabetes mellitus ([place])   . UTI (lower urinary tract infection)     Past Surgical History:  Procedure Laterality Date  . CARDIAC CATHETERIZATION  10/25/92  . CARDIAC CATHETERIZATION  11/18/03   w/grafts 100%CX LAD 80 & 100%  . CARDIAC CATHETERIZATION  01/24/05   diffuse disease of native vessels  . CARDIAC CATHETERIZATION  06/06/06   severe native CAD  .  CARDIOVERSION  06/06/06   successful  . CATARACT EXTRACTION W/ INTRAOCULAR LENS  IMPLANT, BILATERAL Bilateral   . CORONARY ARTERY BYPASS GRAFT  10/27/92   LIMA to LAD,SVG to LAD second diagonal,obtuse maraginal of the CX and posterior descendingbranch of the RCA  . INSERT / REPLACE / REMOVE PACEMAKER  06/29/2006   Medtronic adapta  . REFRACTIVE SURGERY Bilateral     Current Medications: Outpatient Medications Prior to Visit  Medication Sig Dispense Refill  . ACCU-CHEK FASTCLIX LANCETS MISC Use to check sugar 2 times daily 200 each 11  . acetaminophen (TYLENOL) 500 MG tablet Take 500 mg by mouth every 6 (six) hours as needed (pain).    Marland Kitchen acetaminophen-codeine (TYLENOL #3) 300-30 MG tablet Take 1 tablet by mouth every 6 (six) hours as needed for severe pain.  0  . albuterol (PROAIR HFA) 108 (90 Base) MCG/ACT inhaler Inhale 2 puffs into the lungs every 6 (six) hours as needed for wheezing or shortness of breath.    . AMITIZA 24 MCG capsule Take 1 capsule (24 mcg total) by mouth 2 (two) times daily with a meal. 60 capsule 3  . ARGININE PO Take 1 capsule by mouth daily.    Marland Kitchen atropine 1 % ophthalmic solution Place 1 drop into both eyes daily as needed (burning eyes).   0  . Blood Glucose Monitoring Suppl (ACCU-CHEK AVIVA PLUS) w/Device KIT Use to check sugars 2 times daily. 1 kit 0  . docusate sodium (COLACE) 100 MG capsule Take 100 mg daily by mouth.    . donepezil (ARICEPT) 5 MG tablet Take 5 mg by mouth at bedtime.   0  . folic acid (FOLVITE) 1 MG tablet Take 1 tablet (1 mg total) by mouth daily. 30 tablet 10  . gabapentin (NEURONTIN) 100 MG capsule Take 100 mg by mouth 2 (two) times daily.     Marland Kitchen glipiZIDE (GLUCOTROL) 5 MG tablet Take 2.5 mg daily before breakfast by mouth.    Marland Kitchen glucose blood (ACCU-CHEK AVIVA PLUS) test strip Use as instructed to check sugar 2 times daily. 200 each 11  . guaifenesin (MUCUS RELIEF CHEST CONGESTION) 400 MG TABS tablet Take 400 mg by mouth 2 (two) times daily as  needed (cough/ congestion).    Marland Kitchen levalbuterol (XOPENEX) 0.63 MG/3ML nebulizer solution Take 3 mLs by nebulization 3 (three) times daily as needed for wheezing or shortness of breath.   0  . levothyroxine (SYNTHROID) 125 MCG tablet Take 0.5 tablets (62.5 mcg total) by mouth daily. 45 tablet 1  . loratadine (CLARITIN) 10 MG tablet Take 10 mg by mouth daily.    . magnesium citrate SOLN Take 1 Bottle by mouth every 7 (seven) days.    . meclizine (ANTIVERT) 25 MG tablet Take 25 mg by mouth 3 (three) times daily as needed for dizziness.    Marland Kitchen ofloxacin (OCUFLOX) 0.3 % ophthalmic solution Place 1 drop into both eyes 4 (four) times daily as needed (for drainage).   0  .  omeprazole (PRILOSEC) 40 MG capsule Take 1 capsule (40 mg total) by mouth 2 (two) times daily. 60 capsule 3  . OVER THE COUNTER MEDICATION Take 1 Can by mouth daily. Enterex nutritional supplement - with choline    . OVER THE COUNTER MEDICATION Kidney supplement capsules (Choline 100 mg, trimethylglycine 50 mg, glutathione 50 mg, methyltetrahydrofolate, l-arginine-, l-ornithine, l-citruline): Take 1 capsule by mouth two to three times a week    . Polyvinyl Alcohol-Povidone (REFRESH OP) Place 1 drop into both eyes daily as needed (dry eyes).     . Potassium Chloride CR (MICRO-K) 8 MEQ CPCR capsule CR TAKE 1 CAPSULE  DAILY. 90 capsule 1  . rosuvastatin (CRESTOR) 20 MG tablet Take 1 tablet (20 mg total) by mouth daily. 90 tablet 3  . traMADol (ULTRAM) 50 MG tablet Take 1 tablet (50 mg total) by mouth 2 (two) times daily as needed for moderate pain.    Marland Kitchen warfarin (COUMADIN) 3 MG tablet TAKE 1 TABLET TO 1 AND 1/2 TABLETS EVERY DAY AS DIRECTED BY COUMADIN CLINIC. 140 tablet 1  . zolpidem (AMBIEN) 10 MG tablet Take 5-10 mg by mouth at bedtime as needed for sleep.   0  . furosemide (LASIX) 40 MG tablet Take 40 mg by mouth in the morning and may take an additional 40 mg if weight increases to 170 pounds overnight    . AMITIZA 8 MCG capsule take 1  capsule by mouth twice a day with food (Patient not taking: Reported on 11/28/2016) 60 capsule 3  . docusate sodium (COLACE) 100 MG capsule Take 1 capsule (100 mg total) by mouth 2 (two) times daily. (Patient not taking: Reported on 11/28/2016) 10 capsule 0  . glipiZIDE (GLUCOTROL) 5 MG tablet Take 1/2 pill daily (Patient not taking: Reported on 11/28/2016) 45 tablet 1   No facility-administered medications prior to visit.      Allergies:   Macrobid [nitrofurantoin]; Darvon; Digoxin and related; Penicillins; Percocet [oxycodone-acetaminophen]; Percodan [oxycodone-aspirin]; and Vicodin [hydrocodone-acetaminophen]   Social History   Socioeconomic History  . Marital status: Widowed    Spouse name: None  . Number of children: 10  . Years of education: None  . Highest education level: None  Social Needs  . Financial resource strain: None  . Food insecurity - worry: None  . Food insecurity - inability: None  . Transportation needs - medical: None  . Transportation needs - non-medical: None  Occupational History    Employer: RETIRED  Tobacco Use  . Smoking status: Never Smoker  . Smokeless tobacco: Never Used  Substance and Sexual Activity  . Alcohol use: No    Alcohol/week: 0.0 oz  . Drug use: No  . Sexual activity: No  Other Topics Concern  . None  Social History Narrative  . None     Family History:  The patient's family history includes Breast cancer in her daughter; Colon polyps in her daughter; Diabetes in her brother, daughter, father, and son; Heart attack in her brother; Heart disease in her father; Stroke in her sister.   ROS:   Please see the history of present illness.    ROS All other systems reviewed and are negative.   PHYSICAL EXAM:   VS:  BP (!) 84/55   Pulse 73   Ht 5' (1.524 m)   Wt 171 lb (77.6 kg)   BMI 33.40 kg/m     General: Alert, oriented x3, no distress, obese Head: no evidence of trauma, PERRL, EOMI, no exophtalmos or lid  lag, no myxedema, no  xanthelasma; normal ears, nose and oropharynx Neck: normal jugular venous pulsations and no hepatojugular reflux; brisk carotid pulses without delay and no carotid bruits Chest: clear to auscultation, no signs of consolidation by percussion or palpation, normal fremitus, symmetrical and full respiratory excursions Cardiovascular: normal position and quality of the apical impulse, regular rhythm, normal first and paradoxically split second heart sounds, no murmurs, rubs or gallops healthy pacemaker site. Abdomen: no tenderness or distention, no masses by palpation, no abnormal pulsatility or arterial bruits, normal bowel sounds, no hepatosplenomegaly Extremities: no clubbing, cyanosis or edema; 2+ radial, ulnar and brachial pulses bilaterally; 2+ right femoral, posterior tibial and dorsalis pedis pulses; 2+ left femoral, posterior tibial and dorsalis pedis pulses; no subclavian or femoral bruits Neurological: grossly nonfocal Psych: Normal mood and affect '   Wt Readings from Last 3 Encounters:  11/28/16 171 lb (77.6 kg)  09/20/16 166 lb 8 oz (75.5 kg)  09/11/16 177 lb 14.6 oz (80.7 kg)      Studies/Labs Reviewed:   EKG:  EKG is not ordered today.   The intracardiac electrogram today demonstrates A sensed V paced rhythm  Recent Labs: 06/19/2016: Magnesium 2.6 09/08/2016: ALT 10; B Natriuretic Peptide 306.1; TSH 1.560 09/11/2016: BUN 34; Creatinine, Ser 1.50; Hemoglobin 10.7; Platelets 200; Potassium 3.4; Sodium 137   Lipid Panel    Component Value Date/Time   CHOL 222 (H) 12/01/2015 1543   TRIG 244.0 (H) 12/01/2015 1543   HDL 32.20 (L) 12/01/2015 1543   CHOLHDL 7 12/01/2015 1543   VLDL 48.8 (H) 12/01/2015 1543   LDLCALC 141 (H) 10/25/2015 1118   LDLDIRECT 132.0 03/02/2016 1554     ASSESSMENT:    1. Chronic combined systolic and diastolic CHF (congestive heart failure) (HCC)   2. Paroxysmal atrial fibrillation ([place])   3. Long term current use of anticoagulant   4. Complete  heart block ([place])   5. Pacemaker   6. Coronary artery disease involving coronary bypass graft of native heart with other forms of angina pectoris ([place])   7. Mixed hyperlipidemia   8. Diabetes mellitus due to underlying condition with other circulatory complication, without long-term current use of insulin (H[facility])      PLAN:  In order of problems listed above:  1. CHF: She is chronically hypotensive and probably hypovolemic: we will relax her diuretic regimen.  Reduce furosemide to 20 mg daily and only increase to 40 mg on days that she weighs 175 pounds or more (over 210 when weighed with her wheelchair) clinically she appears to be mildly hypovolemic. We'll decrease the furosemide slightly (20 mg 4 days a week, 40 mg 3 days a week).  Call us if she develops edema or dyspnea at rest.  Especially wary of orthopnea symptoms.  Unable to treat with other heart failure medications due to hypotension. 2. Afib: Burden of atrial fibrillation is extremely low.  The episodes are both very infrequent and very brief.  Unfortunately she is at very high embolic risk.  thankfully she has not had recent bleeding. CHADSVasc 3 (age 17, CVA 2, CHF, CAD, previous HTN, DM).   3. Warfarin: anticoagulation level was subtherapeutic today.  4. CHB: She is pacemaker dependent.  Normal recent device download 5. PPM: Remote download in 3 months and office visit in 6 months.  Note the unusual pattern on her EKGs with positive R waves in lead V1, V2. Reviewed radiological images and the right ventricular lead is clearly in the right ventricular cavity, not  the epicardial veins. It's possible that the tip of the lead is located epicardially/microperforation. 6. CAD s/p CABG: She does have angina pectoris, known reversible ischemia on nuclear stress test and had a recent non-STEMI.  Unfortunately there are no good options for antianginal therapy due to her low blood pressure.  Continue statin.  Off aspirin due to GI bleeding and  concomitant treatment with warfarin. 7. HLP: On statin with fair lipid profile. Labs in November were taken when her statin was interrupted. Since then she has restarted rosuvastatin 10 mg daily. Recheck lipids.  She also needs a basic metabolic panel and BNP.  Unfortunately lab is already closed today. 8. DM: Last hemoglobin A1c was 7.8%, but considering her multiple comorbid conditions and advanced age I think this is acceptable    Medication Adjustments/Labs and Tests Ordered: Current medicines are reviewed at length with the patient today.  Concerns regarding medicines are outlined above.  Medication changes, Labs and Tests ordered today are listed in the Patient Instructions below. Patient Instructions  Medication Instructions: Dr Sallyanne Kuster has recommended making the following medication changes: 1. DECREASE Furosemide to 20 mg daily  Weigh daily. If weight is 175 pounds or more, take 40 mg of Lasix, until weight drops back down to 175 pounds or less. Call [phone] if weight climbs more than 3 pounds in a day or 5 pounds in a week. No salt to very little salt in your diet.  No more than 2000 mg in a day. Call if increased shortness of breath or increased swelling.  Labwork: Your physician recommends that you return for lab work in 2 weeks (at your convenience).  Testing/Procedures: NONE ORDERED  Follow-up: Your physician recommends that you schedule a follow-up appointment in 4-6 weeks with a PA/NP.  Dr Sallyanne Kuster recommends that you schedule a follow-up appointment first available.  If you need a refill on your cardiac medications before your next appointment, please call your pharmacy.    Signed, Sanda Klein, MD  11/28/2016 6:23 PM    [address], [place], [place]  [address] Phone: [phone]; Fax: [phone]

## 2016-12-04 ENCOUNTER — Other Ambulatory Visit: Payer: Self-pay | Admitting: Endocrinology

## 2016-12-06 ENCOUNTER — Telehealth: Payer: Self-pay | Admitting: Gastroenterology

## 2016-12-06 ENCOUNTER — Telehealth: Payer: Self-pay | Admitting: Cardiovascular Disease

## 2016-12-06 NOTE — Telephone Encounter (Signed)
LMTCB

## 2016-12-06 NOTE — Telephone Encounter (Signed)
[name] (pt daughter calling to discuss patient weight gain. Patient denies sob.

## 2016-12-06 NOTE — Telephone Encounter (Signed)
New Message  Pt daughter returning RN call

## 2016-12-06 NOTE — Telephone Encounter (Signed)
Spoke with daughter [name]. Patient seen on 11/13 by MD. Lasix adjusted. Instructions below.  1. CHF: She is chronically hypotensive and probably hypovolemic: we will relax her diuretic regimen.  Reduce furosemide to 20 mg daily and only increase to 40 mg on days that she weighs 175 pounds or more (over 210 when weighed with her wheelchair) clinically she appears to be mildly hypovolemic. We'll decrease the furosemide slightly (20 mg 4 days a week, 40 mg 3 days a week).  Call us if she develops edema or dyspnea at rest.  Especially wary of orthopnea symptoms.  Unable to treat with other heart failure medications due to hypotension.  Daughter called with weights. Advised to continue med as MD directed and provided instructions to call if patient has edema or dyspnea at rest.  11/19  170lbs 11/20  171.5 lbs 11/21  167lbs

## 2016-12-06 NOTE — Telephone Encounter (Signed)
Called available number, left msg for [name] to call.

## 2016-12-11 NOTE — Telephone Encounter (Signed)
Okay to hold the Amitiza if she is having liquid stools.

## 2016-12-13 ENCOUNTER — Other Ambulatory Visit: Payer: Self-pay | Admitting: Endocrinology

## 2016-12-14 ENCOUNTER — Telehealth: Payer: Self-pay | Admitting: Gastroenterology

## 2016-12-14 NOTE — Telephone Encounter (Signed)
Patient had last week. No Amitiza was given for several days. The Amitiza was resumed after holding it for 3 to 4 days. Now she is not having any bowel movement. That was 4 days ago. She is not complaining of abdominal discomfort. She passes some gas. Daughter will try a Ducolax suppository tonight.

## 2016-12-14 NOTE — Telephone Encounter (Signed)
Ok, if no BM still, please advise to do a bowel purge with Miralax. Thanks

## 2016-12-15 ENCOUNTER — Telehealth: Payer: Self-pay | Admitting: Cardiovascular Disease

## 2016-12-15 NOTE — Telephone Encounter (Signed)
Daughter advised.

## 2016-12-15 NOTE — Telephone Encounter (Signed)
[name] (pt daughter) calling, states that patient has"gained a lot of  weight in two days."

## 2016-12-15 NOTE — Telephone Encounter (Signed)
Goes directly to voice mail. Left msg for daughter [name] [facility] contact) to call.

## 2016-12-19 ENCOUNTER — Telehealth: Payer: Self-pay | Admitting: Cardiovascular Disease

## 2016-12-19 NOTE — Telephone Encounter (Signed)
Patient daughter [name]) calling, states that she would like to know if patient could have INR checked at her car?

## 2016-12-19 NOTE — Telephone Encounter (Signed)
F/u appt for INR check tomorrow 12/5 - confirmed with daughter [name]

## 2016-12-20 ENCOUNTER — Ambulatory Visit (INDEPENDENT_AMBULATORY_CARE_PROVIDER_SITE_OTHER): Payer: Medicare HMO | Admitting: Pharmacist Clinician (PhC)/ Clinical Pharmacy Specialist

## 2016-12-20 DIAGNOSIS — I82409 Acute embolism and thrombosis of unspecified deep veins of unspecified lower extremity: Secondary | ICD-10-CM

## 2016-12-20 DIAGNOSIS — I482 Chronic atrial fibrillation, unspecified: Secondary | ICD-10-CM

## 2016-12-20 DIAGNOSIS — Z7901 Long term (current) use of anticoagulants: Secondary | ICD-10-CM | POA: Diagnosis not present

## 2016-12-20 LAB — POCT INR: INR: 1.8

## 2016-12-20 NOTE — Telephone Encounter (Signed)
Left Tommi Rumps to call if needed.

## 2016-12-26 ENCOUNTER — Encounter: Payer: Medicare HMO | Admitting: *Deleted

## 2016-12-29 ENCOUNTER — Encounter: Payer: Self-pay | Admitting: Cardiology

## 2017-01-02 ENCOUNTER — Ambulatory Visit: Payer: Medicare HMO | Admitting: Physician Assistant

## 2017-01-02 ENCOUNTER — Telehealth: Payer: Self-pay | Admitting: Physician Assistant

## 2017-01-02 ENCOUNTER — Ambulatory Visit (INDEPENDENT_AMBULATORY_CARE_PROVIDER_SITE_OTHER): Payer: Medicare HMO | Admitting: Pharmacist Clinician (PhC)/ Clinical Pharmacy Specialist

## 2017-01-02 ENCOUNTER — Encounter: Payer: Self-pay | Admitting: Physician Assistant

## 2017-01-02 VITALS — BP 92/57 | HR 71 | Ht 60.0 in

## 2017-01-02 DIAGNOSIS — Z7901 Long term (current) use of anticoagulants: Secondary | ICD-10-CM

## 2017-01-02 DIAGNOSIS — I5042 Chronic combined systolic (congestive) and diastolic (congestive) heart failure: Secondary | ICD-10-CM | POA: Diagnosis not present

## 2017-01-02 DIAGNOSIS — N183 Chronic kidney disease, stage 3 unspecified: Secondary | ICD-10-CM

## 2017-01-02 DIAGNOSIS — I82409 Acute embolism and thrombosis of unspecified deep veins of unspecified lower extremity: Secondary | ICD-10-CM | POA: Diagnosis not present

## 2017-01-02 DIAGNOSIS — Z950 Presence of cardiac pacemaker: Secondary | ICD-10-CM | POA: Diagnosis not present

## 2017-01-02 DIAGNOSIS — I442 Atrioventricular block, complete: Secondary | ICD-10-CM

## 2017-01-02 DIAGNOSIS — I480 Paroxysmal atrial fibrillation: Secondary | ICD-10-CM | POA: Diagnosis not present

## 2017-01-02 DIAGNOSIS — I10 Essential (primary) hypertension: Secondary | ICD-10-CM | POA: Diagnosis not present

## 2017-01-02 DIAGNOSIS — E782 Mixed hyperlipidemia: Secondary | ICD-10-CM | POA: Diagnosis not present

## 2017-01-02 LAB — POCT INR: INR: 2.7

## 2017-01-02 NOTE — Patient Instructions (Addendum)
  Description   Continue with 1 tablet daily except 1.5 tablets each Monday, Wednesday and Friday.  Repeat INR in 4-6 weeks (depending on if [facility])

## 2017-01-02 NOTE — Progress Notes (Signed)
Cardiology Office Note   Date:  01/02/2017   ID:  KORALYN PRESTAGE, DOB 05/22/1920, MRN [id number]  PCP:  [name], MD  Cardiologist: Dr. Sallyanne Kuster, 11/28/2016 Rosaria Ferries, PA-C   Chief Complaint  Patient presents with  . Follow-up    History of Present Illness: NESSA RAMAKER is a 83 y.o. female with a history of CABG 1994, S-D-CHF, PAF on coumadin, DM, HLD, CKD III, CHB s/p MDT PPM w/ gen change 2017, retinopathy and nephropathy, hypotention, abnl MV 2015 and NSTEMI 06/2015 w/ med rx (no cath).  Berklee R Ketter presents for cardiology follow up.   She requires assistance for transfers, 1 assist. She is able to take tiny steps and get to the car or to the bed.  She has some CP now, 7/10. She cannot remember when it started. Does not have it every day. Not sure how often it happens. Her chest wall is tender to palpation. She is not sure if it hurts more with deep inspiration.   She got a pain pill this afternoon because of multiple areas of pain.  It has made her a little sleepy.  She has pain in her lower back and buttock area, the family says there is an area there that they are treating with creams.  She spends most of her time in a hospital bed.  Her legs have not been swelling, she keeps them up most of the time when she is in the bed.  She has dyspnea on exertion, but it is chronic and does not seem to have changed recently.  She does not have palpitations and if she has had atrial fibrillation, is not aware of it.   She has been a little lightheaded or dizzy at times, the family sits her on the side of the bed for a minute before they get her up.   Past Medical History:  Diagnosis Date  . 2nd degree atrioventricular block    a. 06/22/15 Gen change: MDT ADDR01 Adapta, DC PPM (ser# [id number]).  . Allergy to perfume   . Anemia   . Arthritis   . Chronic combined systolic and diastolic CHF (congestive heart failure) ([place])    a. 01/2014 Echo: EF @  least mod-sev reduced with HK of lat/apical, basal inf walls. basalpost AK, Gr 1DD; b. 06/2015 Echo: EF 45-50%, Gr2 DD, mod LVH.   . CKD (chronic kidney disease), stage III ([place])    a. iii - iv.  . Coronary artery disease    a. 1994 s/p cabg;  b. 09/2013 MV: large, sev intensity, partially reversible inf, apical defect, prior inf/ap infarct w/ mild peri-infarct ischemia->Med Rx; c. 06/2015 NSTEMI (trop 6)->Med rx.  . Daily headache   . DVT of upper extremity (deep vein thrombosis) ([place]) 06/13/2012   BUE  . Dyspepsia   . Gastric ulcer   . GERD (gastroesophageal reflux disease)   . H/O: GI bleed 12//13  . Hiatal hernia   . High cholesterol   . History of blood transfusion 2013  . Hypertensive heart disease   . Hypothyroid   . Legally blind   . PAF (paroxysmal atrial fibrillation) ([facility])    a. CHA2DS2VASc = 7-->coumadin.  . Presence of permanent cardiac pacemaker    a. 06/22/15 Gen change: MDT ADDR01 Adapta, DC PPM (ser# [id number]).  . Seasonal allergies   . Type II diabetes mellitus ([place])   . UTI (lower urinary tract infection)     Past Surgical History:  Procedure  Laterality Date  . CARDIAC CATHETERIZATION  10/25/92  . CARDIAC CATHETERIZATION  11/18/03   w/grafts 100%CX LAD 80 & 100%  . CARDIAC CATHETERIZATION  01/24/05   diffuse disease of native vessels  . CARDIAC CATHETERIZATION  06/06/06   severe native CAD  . CARDIOVERSION  06/06/06   successful  . CATARACT EXTRACTION W/ INTRAOCULAR LENS  IMPLANT, BILATERAL Bilateral   . CORONARY ARTERY BYPASS GRAFT  10/27/92   LIMA to LAD,SVG to LAD second diagonal,obtuse maraginal of the CX and posterior descendingbranch of the RCA  . EP IMPLANTABLE DEVICE N/A 06/22/2015   Procedure: PPM/BIV PPM Generator Changeout;  Surgeon: Sanda Klein, MD;  Location: [facility];  Service: Cardiovascular;  Laterality: N/A;  . ESOPHAGOGASTRODUODENOSCOPY  12/22/2011   Procedure: ESOPHAGOGASTRODUODENOSCOPY (EGD);  Surgeon: Gatha Mayer, MD;  Location:  [facility];  Service: Endoscopy;  Laterality: N/A;  . INSERT / REPLACE / REMOVE PACEMAKER  06/29/2006   Medtronic adapta  . REFRACTIVE SURGERY Bilateral     Current Outpatient Medications  Medication Sig Dispense Refill  . ACCU-CHEK FASTCLIX LANCETS MISC Use to check sugar 2 times daily 200 each 11  . acetaminophen (TYLENOL) 500 MG tablet Take 500 mg by mouth every 6 (six) hours as needed (pain).    Marland Kitchen acetaminophen-codeine (TYLENOL #3) 300-30 MG tablet Take 1 tablet by mouth every 6 (six) hours as needed for severe pain.  0  . albuterol (PROAIR HFA) 108 (90 Base) MCG/ACT inhaler Inhale 2 puffs into the lungs every 6 (six) hours as needed for wheezing or shortness of breath.    . AMITIZA 24 MCG capsule Take 1 capsule (24 mcg total) by mouth 2 (two) times daily with a meal. 60 capsule 3  . ARGININE PO Take 1 capsule by mouth daily.    Marland Kitchen atropine 1 % ophthalmic solution Place 1 drop into both eyes daily as needed (burning eyes).   0  . Blood Glucose Monitoring Suppl (ACCU-CHEK AVIVA PLUS) w/Device KIT Use to check sugars 2 times daily. 1 kit 0  . docusate sodium (COLACE) 100 MG capsule Take 100 mg daily by mouth.    . donepezil (ARICEPT) 5 MG tablet Take 5 mg by mouth at bedtime.   0  . folic acid (FOLVITE) 1 MG tablet Take 1 tablet (1 mg total) by mouth daily. 30 tablet 10  . furosemide (LASIX) 20 MG tablet Take 1-2 tablets (20-40 mg total) by mouth daily as directed. 135 tablet 3  . gabapentin (NEURONTIN) 100 MG capsule Take 100 mg by mouth 2 (two) times daily.     Marland Kitchen glipiZIDE (GLUCOTROL) 5 MG tablet TAKE 1/2 PILL DAILY 45 tablet 1  . glucose blood (ACCU-CHEK AVIVA PLUS) test strip Use as instructed to check sugar 2 times daily. 200 each 11  . guaifenesin (MUCUS RELIEF CHEST CONGESTION) 400 MG TABS tablet Take 400 mg by mouth 2 (two) times daily as needed (cough/ congestion).    Marland Kitchen levalbuterol (XOPENEX) 0.63 MG/3ML nebulizer solution Take 3 mLs by nebulization 3 (three) times daily as needed  for wheezing or shortness of breath.   0  . loratadine (CLARITIN) 10 MG tablet Take 10 mg by mouth daily.    . magnesium citrate SOLN Take 1 Bottle by mouth every 7 (seven) days.    . meclizine (ANTIVERT) 25 MG tablet Take 25 mg by mouth 3 (three) times daily as needed for dizziness.    Marland Kitchen ofloxacin (OCUFLOX) 0.3 % ophthalmic solution Place 1 drop into both  eyes 4 (four) times daily as needed (for drainage).   0  . omeprazole (PRILOSEC) 40 MG capsule Take 1 capsule (40 mg total) by mouth 2 (two) times daily. 60 capsule 3  . OVER THE COUNTER MEDICATION Take 1 Can by mouth daily. Enterex nutritional supplement - with choline    . OVER THE COUNTER MEDICATION Kidney supplement capsules (Choline 100 mg, trimethylglycine 50 mg, glutathione 50 mg, methyltetrahydrofolate, l-arginine-, l-ornithine, l-citruline): Take 1 capsule by mouth two to three times a week    . Polyvinyl Alcohol-Povidone (REFRESH OP) Place 1 drop into both eyes daily as needed (dry eyes).     . Potassium Chloride CR (MICRO-K) 8 MEQ CPCR capsule CR TAKE 1 CAPSULE  DAILY. 90 capsule 1  . rosuvastatin (CRESTOR) 20 MG tablet Take 1 tablet (20 mg total) by mouth daily. 90 tablet 3  . SYNTHROID 125 MCG tablet take 1/2 tablet by mouth once daily 45 tablet 1  . traMADol (ULTRAM) 50 MG tablet Take 1 tablet (50 mg total) by mouth 2 (two) times daily as needed for moderate pain.    Marland Kitchen warfarin (COUMADIN) 3 MG tablet TAKE 1 TABLET TO 1 AND 1/2 TABLETS EVERY DAY AS DIRECTED BY COUMADIN CLINIC. 140 tablet 1  . zolpidem (AMBIEN) 10 MG tablet Take 5-10 mg by mouth at bedtime as needed for sleep.   0   No current facility-administered medications for this visit.     Allergies:   Macrobid [nitrofurantoin]; Darvon; Digoxin and related; Penicillins; Percocet [oxycodone-acetaminophen]; Percodan [oxycodone-aspirin]; and Vicodin [hydrocodone-acetaminophen]    Social History:  The patient  reports that  has never smoked. she has never used smokeless  tobacco. She reports that she does not drink alcohol or use drugs.   Family History:  The patient's family history includes Breast cancer in her daughter; Colon polyps in her daughter; Diabetes in her brother, daughter, father, and son; Heart attack in her brother; Heart disease in her father; Stroke in her sister.    ROS:  Please see the history of present illness. All other systems are reviewed and negative.    PHYSICAL EXAM: VS:  BP (!) 92/57   Pulse 71   Ht 5' (1.524 m)   BMI 33.40 kg/m  , BMI Body mass index is 33.4 kg/m. GEN: Well nourished, well developed, female in no acute distress  HEENT: normal for age  Neck: Minimal JVD, no carotid bruit, no masses Cardiac: RRR; soft murmur, no rubs, or gallops Respiratory: Decreased breath sounds bases with a few rales bilaterally, normal work of breathing GI: soft, nontender, nondistended, + BS MS: no deformity or atrophy; no edema; distal pulses are 2+ in all 4 extremities   Skin: warm and dry, no rash Neuro:  Strength and sensation are intact Psych: euthymic mood, full affect   EKG:  EKG is not ordered today.   Recent Labs: 06/19/2016: Magnesium 2.6 09/08/2016: ALT 10; B Natriuretic Peptide 306.1; TSH 1.560 09/11/2016: BUN 34; Creatinine, Ser 1.50; Hemoglobin 10.7; Platelets 200; Potassium 3.4; Sodium 137    Lipid Panel    Component Value Date/Time   CHOL 222 (H) 12/01/2015 1543   TRIG 244.0 (H) 12/01/2015 1543   HDL 32.20 (L) 12/01/2015 1543   CHOLHDL 7 12/01/2015 1543   VLDL 48.8 (H) 12/01/2015 1543   LDLCALC 141 (H) 10/25/2015 1118   LDLDIRECT 132.0 03/02/2016 1554     Wt Readings from Last 3 Encounters:  11/28/16 171 lb (77.6 kg)  09/20/16 166 lb 8 oz (75.5  kg)  09/11/16 177 lb 14.6 oz (80.7 kg)     Other studies Reviewed: Additional studies/ records that were reviewed today include: Office notes, hospital records and testing.  ASSESSMENT AND PLAN:  1.  Chronic combined systolic and diastolic CHF: She does  not have significant volume overload by exam.  Continue current Lasix dose.  Check a BMET today.  2.  CAD: She is not having any ongoing ischemic symptoms.  She is not on aspirin because of the Coumadin.  She is not on the beta-blocker because of history of bradycardia needing a pacemaker.  She is on a statin.  3.  PAF and heart block requiring pacemaker: Her device was interrogated today.  She had one episode of atrial fib 12/11 that lasted 19 minutes.  Neither she or her family are aware of anything.  She had some brief episodes of tachycardia, one on 12/30/2016.  The ventricular rate was 175.  She was asymptomatic with that as well.  She is V pacing over 99% of the time.  Battery life is over 10 years.  Follow-up as scheduled.  4.  Hyperlipidemia: He is on Crestor and her family gives her her medications so she is compliant.  She is not fasting today.  Family expects to have home health recertified soon, have the RN draw liver functions and lipid profile when the patient is fasting.  5.  Chronic anticoagulation: Her INR was therapeutic today when checked by the pharmacist.  Continue INR checks as scheduled.   Current medicines are reviewed at length with the patient today.  The patient does not have concerns regarding medicines.  The following changes have been made:  no change  Labs/ tests ordered today include:  No orders of the defined types were placed in this encounter.    Disposition:   FU with Dr. Sallyanne Kuster  Signed, Rosaria Ferries, PA-C  01/02/2017 3:32 PM    [place] Phone: [phone]; Fax: [phone]  This note was written with the assistance of speech recognition software. Please excuse any transcriptional errors.

## 2017-01-02 NOTE — Telephone Encounter (Signed)
Unsure if able to address today - msg routed to [facility] for recommendations.

## 2017-01-02 NOTE — Telephone Encounter (Signed)
Pt's daughter wants to know kif her pacemaker can be checked when she is here today please.

## 2017-01-02 NOTE — Patient Instructions (Addendum)
Medication Instructions: Your physician recommends that you continue on your current medications as directed. Please refer to the Current Medication list given to you today.  If you need a refill on your cardiac medications before your next appointment, please call your pharmacy.   Labwork: Your physician recommends that you have the following drawn today: BMET   Follow-Up: Your physician wants you to follow-up as scheduled with Dr. Sallyanne Kuster.  Special Instructions: * Once you have home health set up, please have the the nurse check a fasting lipid and liver lab. They may also start checking the coumadin level. Any questions, call [phone]. * Please fax the weight log to the office at [phone]. This may be done every couple of weeks.    Thank you for choosing [facility] at [facility]!!

## 2017-01-02 NOTE — Telephone Encounter (Signed)
Pacemaker data downloaded at patient's office visit with Rosaria Ferries, PA.

## 2017-01-03 LAB — BASIC METABOLIC PANEL WITH GFR
BUN/Creatinine Ratio: 7 — ABNORMAL LOW (ref 12–28)
BUN: 11 mg/dL (ref 10–36)
CO2: 25 mmol/L (ref 20–29)
Calcium: 8.7 mg/dL (ref 8.7–10.3)
Chloride: 105 mmol/L (ref 96–106)
Creatinine, Ser: 1.56 mg/dL — ABNORMAL HIGH (ref 0.57–1.00)
GFR calc Af Amer: 33 mL/min/1.73 — ABNORMAL LOW
GFR calc non Af Amer: 28 mL/min/1.73 — ABNORMAL LOW
Glucose: 197 mg/dL — ABNORMAL HIGH (ref 65–99)
Potassium: 3.7 mmol/L (ref 3.5–5.2)
Sodium: 142 mmol/L (ref 134–144)

## 2017-01-03 NOTE — Progress Notes (Signed)
Thanks MCr 

## 2017-01-04 ENCOUNTER — Ambulatory Visit: Payer: Medicare HMO | Admitting: Endocrinology

## 2017-01-10 ENCOUNTER — Telehealth: Payer: Self-pay | Admitting: Gastroenterology

## 2017-01-10 MED ORDER — AMITIZA 24 MCG PO CAPS: 24.0000 ug | ORAL_CAPSULE | Freq: Two times a day (BID) | ORAL | 3 refills | Status: DC

## 2017-01-10 NOTE — Telephone Encounter (Signed)
Patient daughter states pt needs medication amitiza refilled at [facility] on [address] and clarification for medication on how much pt needs to take

## 2017-01-10 NOTE — Telephone Encounter (Signed)
Med sent to pharmacy.

## 2017-01-11 ENCOUNTER — Other Ambulatory Visit: Payer: Self-pay | Admitting: Cardiovascular Disease

## 2017-01-12 ENCOUNTER — Ambulatory Visit: Payer: Medicare HMO | Admitting: Endocrinology

## 2017-01-16 DIAGNOSIS — N189 Chronic kidney disease, unspecified: Secondary | ICD-10-CM | POA: Diagnosis not present

## 2017-01-16 DIAGNOSIS — K599 Functional intestinal disorder, unspecified: Secondary | ICD-10-CM | POA: Diagnosis not present

## 2017-01-16 DIAGNOSIS — M47812 Spondylosis without myelopathy or radiculopathy, cervical region: Secondary | ICD-10-CM | POA: Diagnosis not present

## 2017-01-16 DIAGNOSIS — I2510 Atherosclerotic heart disease of native coronary artery without angina pectoris: Secondary | ICD-10-CM | POA: Diagnosis not present

## 2017-01-16 DIAGNOSIS — G8929 Other chronic pain: Secondary | ICD-10-CM | POA: Diagnosis not present

## 2017-01-16 DIAGNOSIS — E1122 Type 2 diabetes mellitus with diabetic chronic kidney disease: Secondary | ICD-10-CM | POA: Diagnosis not present

## 2017-01-16 DIAGNOSIS — J45909 Unspecified asthma, uncomplicated: Secondary | ICD-10-CM | POA: Diagnosis not present

## 2017-01-16 DIAGNOSIS — R32 Unspecified urinary incontinence: Secondary | ICD-10-CM | POA: Diagnosis not present

## 2017-01-16 DIAGNOSIS — R69 Illness, unspecified: Secondary | ICD-10-CM | POA: Diagnosis not present

## 2017-01-16 DIAGNOSIS — I509 Heart failure, unspecified: Secondary | ICD-10-CM | POA: Diagnosis not present

## 2017-01-18 DIAGNOSIS — I509 Heart failure, unspecified: Secondary | ICD-10-CM | POA: Diagnosis not present

## 2017-01-18 DIAGNOSIS — R69 Illness, unspecified: Secondary | ICD-10-CM | POA: Diagnosis not present

## 2017-01-18 DIAGNOSIS — E1122 Type 2 diabetes mellitus with diabetic chronic kidney disease: Secondary | ICD-10-CM | POA: Diagnosis not present

## 2017-01-18 DIAGNOSIS — J45909 Unspecified asthma, uncomplicated: Secondary | ICD-10-CM | POA: Diagnosis not present

## 2017-01-18 DIAGNOSIS — I2510 Atherosclerotic heart disease of native coronary artery without angina pectoris: Secondary | ICD-10-CM | POA: Diagnosis not present

## 2017-01-18 DIAGNOSIS — R32 Unspecified urinary incontinence: Secondary | ICD-10-CM | POA: Diagnosis not present

## 2017-01-18 DIAGNOSIS — G8929 Other chronic pain: Secondary | ICD-10-CM | POA: Diagnosis not present

## 2017-01-18 DIAGNOSIS — M47812 Spondylosis without myelopathy or radiculopathy, cervical region: Secondary | ICD-10-CM | POA: Diagnosis not present

## 2017-01-18 DIAGNOSIS — N189 Chronic kidney disease, unspecified: Secondary | ICD-10-CM | POA: Diagnosis not present

## 2017-01-18 DIAGNOSIS — K599 Functional intestinal disorder, unspecified: Secondary | ICD-10-CM | POA: Diagnosis not present

## 2017-01-24 NOTE — Assessment & Plan Note (Signed)
DCIS left breast ER 100% PR 40%, 8.6 area of calcifications status post biopsy on 06/30/2014  Recommendation:Patient is not a candidate for surgery because of her CHF/pacemaker  Current treatment: Anastrozole 1 mg daily started June 2016 discontinued on 01/18/2015 due to polypharmacy issues

## 2017-01-25 ENCOUNTER — Inpatient Hospital Stay: Payer: Medicare HMO | Attending: Hematology and Oncology | Admitting: Hematology and Oncology

## 2017-01-25 DIAGNOSIS — Z170 Estrogen receptor positive status [ER+]: Secondary | ICD-10-CM | POA: Diagnosis not present

## 2017-01-25 DIAGNOSIS — Z950 Presence of cardiac pacemaker: Secondary | ICD-10-CM

## 2017-01-25 DIAGNOSIS — D0512 Intraductal carcinoma in situ of left breast: Secondary | ICD-10-CM | POA: Diagnosis not present

## 2017-01-25 DIAGNOSIS — N644 Mastodynia: Secondary | ICD-10-CM | POA: Diagnosis not present

## 2017-01-25 DIAGNOSIS — M79621 Pain in right upper arm: Secondary | ICD-10-CM

## 2017-01-25 DIAGNOSIS — I509 Heart failure, unspecified: Secondary | ICD-10-CM | POA: Diagnosis not present

## 2017-01-25 DIAGNOSIS — M79622 Pain in left upper arm: Secondary | ICD-10-CM | POA: Insufficient documentation

## 2017-01-25 DIAGNOSIS — C50112 Malignant neoplasm of central portion of left female breast: Secondary | ICD-10-CM

## 2017-01-25 NOTE — Progress Notes (Signed)
Patient Care Team: [name], MD as PCP - General (Internal Medicine)  DIAGNOSIS:  Encounter Diagnosis  Name Primary?  . 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, estrogen receptor positive ([place])     SUMMARY OF ONCOLOGIC HISTORY:   Cancer of central portion of left female breast ([place])   06/30/2014 Initial Diagnosis    Left breast biopsy: DCIS ER 100%, PR 40%, 8.6 cm area of grouped segmental calcifications      07/30/2014 - 01/28/2015 Anti-estrogen oral therapy    Anastrozole 1 mg every other day palliative treatment      03/29/2015 - 03/31/2015 Hospital Admission    Hospitalization for generalized weakness, nausea and vomiting, syncope      06/24/2015 - 07/01/2015 Hospital Admission    Hospitalization for atypical chest pain/NSTEMI       CHIEF COMPLIANT: Follow-up of left breast DCIS  INTERVAL HISTORY: [name] is an 84 year old with above-mentioned history left breast DCIS who took anastrozole therapy for 6 months and stopped taking it because of polypharmacy issues.  She is here for a routine follow-up.  She has had multiple hospitalizations related to generalized weakness and chest pain in the past.  She is complaining of bilateral breast pain as well as bilateral axillary pains  REVIEW OF SYSTEMS:   Constitutional: Denies fevers, chills or abnormal weight loss Eyes: Denies blurriness of vision Ears, nose, mouth, throat, and face: Denies mucositis or sore throat Respiratory: Denies cough, dyspnea or wheezes Cardiovascular: Denies palpitation, chest discomfort Gastrointestinal:  Denies nausea, heartburn or change in bowel habits Skin: Denies abnormal skin rashes Lymphatics: Denies new lymphadenopathy or easy bruising Neurological:Denies numbness, tingling or new weaknesses Behavioral/Psych: Mood is stable, no new changes  Extremities: No lower extremity edema Breast: Complaining of pain in bilateral breasts, right axilla All other  systems were reviewed with the patient and are negative.  I have reviewed the past medical history, past surgical history, social history and family history with the patient and they are unchanged from previous note.  ALLERGIES:  is allergic to macrobid [nitrofurantoin]; darvon; digoxin and related; penicillins; percocet [oxycodone-acetaminophen]; percodan [oxycodone-aspirin]; and vicodin [hydrocodone-acetaminophen].  MEDICATIONS:  Current Outpatient Medications  Medication Sig Dispense Refill  . ACCU-CHEK FASTCLIX LANCETS MISC Use to check sugar 2 times daily 200 each 11  . acetaminophen (TYLENOL) 500 MG tablet Take 500 mg by mouth every 6 (six) hours as needed (pain).    Marland Kitchen acetaminophen-codeine (TYLENOL #3) 300-30 MG tablet Take 1 tablet by mouth every 6 (six) hours as needed for severe pain.  0  . albuterol (PROAIR HFA) 108 (90 Base) MCG/ACT inhaler Inhale 2 puffs into the lungs every 6 (six) hours as needed for wheezing or shortness of breath.    . AMITIZA 24 MCG capsule Take 1 capsule (24 mcg total) by mouth 2 (two) times daily with a meal. 60 capsule 3  . ARGININE PO Take 1 capsule by mouth daily.    Marland Kitchen atropine 1 % ophthalmic solution Place 1 drop into both eyes daily as needed (burning eyes).   0  . Blood Glucose Monitoring Suppl (ACCU-CHEK AVIVA PLUS) w/Device KIT Use to check sugars 2 times daily. 1 kit 0  . docusate sodium (COLACE) 100 MG capsule Take 100 mg daily by mouth.    . donepezil (ARICEPT) 5 MG tablet Take 5 mg by mouth at bedtime.   0  . folic acid (FOLVITE) 1 MG tablet Take 1 tablet (1 mg total) by mouth  daily. 30 tablet 10  . furosemide (LASIX) 20 MG tablet Take 1-2 tablets (20-40 mg total) by mouth daily as directed. 135 tablet 3  . gabapentin (NEURONTIN) 100 MG capsule Take 100 mg by mouth 2 (two) times daily.     Marland Kitchen glipiZIDE (GLUCOTROL) 5 MG tablet TAKE 1/2 PILL DAILY 45 tablet 1  . glucose blood (ACCU-CHEK AVIVA PLUS) test strip Use as instructed to check sugar 2  times daily. 200 each 11  . guaifenesin (MUCUS RELIEF CHEST CONGESTION) 400 MG TABS tablet Take 400 mg by mouth 2 (two) times daily as needed (cough/ congestion).    Marland Kitchen levalbuterol (XOPENEX) 0.63 MG/3ML nebulizer solution Take 3 mLs by nebulization 3 (three) times daily as needed for wheezing or shortness of breath.   0  . loratadine (CLARITIN) 10 MG tablet Take 10 mg by mouth daily.    . magnesium citrate SOLN Take 1 Bottle by mouth every 7 (seven) days.    . meclizine (ANTIVERT) 25 MG tablet Take 25 mg by mouth 3 (three) times daily as needed for dizziness.    Marland Kitchen ofloxacin (OCUFLOX) 0.3 % ophthalmic solution Place 1 drop into both eyes 4 (four) times daily as needed (for drainage).   0  . omeprazole (PRILOSEC) 40 MG capsule Take 1 capsule (40 mg total) by mouth 2 (two) times daily. 60 capsule 3  . OVER THE COUNTER MEDICATION Take 1 Can by mouth daily. Enterex nutritional supplement - with choline    . OVER THE COUNTER MEDICATION Kidney supplement capsules (Choline 100 mg, trimethylglycine 50 mg, glutathione 50 mg, methyltetrahydrofolate, l-arginine-, l-ornithine, l-citruline): Take 1 capsule by mouth two to three times a week    . Polyvinyl Alcohol-Povidone (REFRESH OP) Place 1 drop into both eyes daily as needed (dry eyes).     . Potassium Chloride CR (MICRO-K) 8 MEQ CPCR capsule CR TAKE 1 CAPSULE  DAILY. 90 capsule 1  . rosuvastatin (CRESTOR) 20 MG tablet Take 1 tablet (20 mg total) by mouth daily. 90 tablet 3  . SYNTHROID 125 MCG tablet take 1/2 tablet by mouth once daily 45 tablet 1  . traMADol (ULTRAM) 50 MG tablet Take 1 tablet (50 mg total) by mouth 2 (two) times daily as needed for moderate pain.    Marland Kitchen warfarin (COUMADIN) 3 MG tablet TAKE 1 TABLET TO 1 AND 1/2 TABLETS EVERY DAY AS DIRECTED BY COUMADIN CLINIC. 140 tablet 1  . zolpidem (AMBIEN) 10 MG tablet Take 5-10 mg by mouth at bedtime as needed for sleep.   0   No current facility-administered medications for this visit.     PHYSICAL  EXAMINATION: ECOG PERFORMANCE STATUS: 3 - Symptomatic, >50% confined to bed  Vitals:   01/25/17 1500  BP: 108/67  Pulse: 71  Resp: 17  Temp: 99 F (37.2 C)  SpO2: 98%   Filed Weights   01/25/17 1500  Weight: 181 lb 3.2 oz (82.2 kg)    GENERAL:alert, no distress and comfortable SKIN: skin color, texture, turgor are normal, no rashes or significant lesions EYES: normal, Conjunctiva are pink and non-injected, sclera clear OROPHARYNX:no exudate, no erythema and lips, buccal mucosa, and tongue normal  NECK: supple, thyroid normal size, non-tender, without nodularity LYMPH:  no palpable lymphadenopathy in the cervical, axillary or inguinal LUNGS: clear to auscultation and percussion with normal breathing effort HEART: regular rate & rhythm and no murmurs and no lower extremity edema ABDOMEN:abdomen soft, non-tender and normal bowel sounds MUSCULOSKELETAL:no cyanosis of digits and no clubbing  NEURO:  alert & oriented x 3 with fluent speech, no focal motor/sensory deficits EXTREMITIES: No lower extremity edema BREAST: No palpable masses or nodules in either right or left breasts. No palpable axillary supraclavicular or infraclavicular adenopathy no breast tenderness or nipple discharge. (exam performed in the presence of a chaperone)  LABORATORY DATA:  I have reviewed the data as listed CMP Latest Ref Rng & Units 01/02/2017 09/11/2016 09/10/2016  Glucose 65 - 99 mg/dL 197(H) 137(H) 133(H)  BUN 10 - 36 mg/dL 11 34(H) 38(H)  Creatinine 0.57 - 1.00 mg/dL 1.56(H) 1.50(H) 1.75(H)  Sodium 134 - 144 mmol/L 142 137 134(L)  Potassium 3.5 - 5.2 mmol/L 3.7 3.4(L) 3.7  Chloride 96 - 106 mmol/L 105 99(L) 98(L)  CO2 20 - 29 mmol/L _0 Calcium 8.7 - 10.3 mg/dL 8.7 9.0 8.9  Total Protein 6.5 - 8.1 g/dL - - -  Total Bilirubin 0.3 - 1.2 mg/dL - - -  Alkaline Phos 38 - 126 U/L - - -  AST 15 - 41 U/L - - -  ALT 14 - 54 U/L - - -    Lab Results  Component Value Date   WBC 11.1 (H)  09/11/2016   HGB 10.7 (L) 09/11/2016   HCT 32.3 (L) 09/11/2016   MCV 86.1 09/11/2016   PLT 200 09/11/2016   NEUTROABS 8.7 (H) 09/09/2016    ASSESSMENT & PLAN:  Cancer of central portion of left female breast ([place]) 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 Patient is not healthy enough to undergo mammograms because we would not be able to offer any surgical treatments. Because of this I recommended conservative measures.  She will take pain medication as needed for bilateral breast pain.  Return to clinic on an as-needed basis. I spent 25 minutes talking to the patient of which more than half was spent in counseling and coordination of care.  No orders of the defined types were placed in this encounter.  The patient has a good understanding of the overall plan. she agrees with it. she will call with any problems that may develop before the next visit here.   Harriette Ohara, MD 01/25/17

## 2017-01-26 DIAGNOSIS — N3020 Other chronic cystitis without hematuria: Secondary | ICD-10-CM | POA: Diagnosis not present

## 2017-01-26 DIAGNOSIS — R300 Dysuria: Secondary | ICD-10-CM | POA: Diagnosis not present

## 2017-01-30 ENCOUNTER — Other Ambulatory Visit: Payer: Self-pay

## 2017-01-30 ENCOUNTER — Telehealth: Payer: Self-pay | Admitting: Endocrinology

## 2017-01-30 DIAGNOSIS — R69 Illness, unspecified: Secondary | ICD-10-CM | POA: Diagnosis not present

## 2017-01-30 MED ORDER — GLUCOSE BLOOD VI STRP: ORAL_STRIP | 4 refills | Status: DC

## 2017-01-30 NOTE — Telephone Encounter (Signed)
Pt is asking for samples to hold her over until she gets her order in. I told her we do have one touch she will come get them

## 2017-01-30 NOTE — Telephone Encounter (Signed)
Prescription for onetouch test strips sent and samples given to patient to hold her over till she can pick them up

## 2017-01-30 NOTE — Telephone Encounter (Signed)
The insurance only covers verio or ultra and please call into [facility]

## 2017-01-30 NOTE — Telephone Encounter (Signed)
Pharmacy is calling to let us know that the accucheck is no longer covered by pts insurance the preferred is now one touch please send in for new supplies as one touch and meter

## 2017-01-31 ENCOUNTER — Telehealth: Payer: Self-pay | Admitting: Cardiovascular Disease

## 2017-01-31 NOTE — Telephone Encounter (Signed)
New message    Deana from [facility] calling to confirm she is to check PTINR on Friday when she goes to see patient. Please call [phone].

## 2017-01-31 NOTE — Telephone Encounter (Signed)
Orders for INR on Friday (Jan/18/19) given to [facility].  Will call results to Coumadin clinic at [phone]

## 2017-02-02 ENCOUNTER — Ambulatory Visit (INDEPENDENT_AMBULATORY_CARE_PROVIDER_SITE_OTHER): Payer: Medicare HMO | Admitting: Pharmacist Clinician (PhC)/ Clinical Pharmacy Specialist

## 2017-02-02 DIAGNOSIS — I482 Chronic atrial fibrillation, unspecified: Secondary | ICD-10-CM

## 2017-02-02 DIAGNOSIS — J45909 Unspecified asthma, uncomplicated: Secondary | ICD-10-CM | POA: Diagnosis not present

## 2017-02-02 DIAGNOSIS — Z7901 Long term (current) use of anticoagulants: Secondary | ICD-10-CM

## 2017-02-02 DIAGNOSIS — I509 Heart failure, unspecified: Secondary | ICD-10-CM | POA: Diagnosis not present

## 2017-02-02 DIAGNOSIS — E1122 Type 2 diabetes mellitus with diabetic chronic kidney disease: Secondary | ICD-10-CM | POA: Diagnosis not present

## 2017-02-02 DIAGNOSIS — M47812 Spondylosis without myelopathy or radiculopathy, cervical region: Secondary | ICD-10-CM | POA: Diagnosis not present

## 2017-02-02 DIAGNOSIS — I2510 Atherosclerotic heart disease of native coronary artery without angina pectoris: Secondary | ICD-10-CM | POA: Diagnosis not present

## 2017-02-02 DIAGNOSIS — R32 Unspecified urinary incontinence: Secondary | ICD-10-CM | POA: Diagnosis not present

## 2017-02-02 DIAGNOSIS — G8929 Other chronic pain: Secondary | ICD-10-CM | POA: Diagnosis not present

## 2017-02-02 DIAGNOSIS — R69 Illness, unspecified: Secondary | ICD-10-CM | POA: Diagnosis not present

## 2017-02-02 DIAGNOSIS — K599 Functional intestinal disorder, unspecified: Secondary | ICD-10-CM | POA: Diagnosis not present

## 2017-02-02 DIAGNOSIS — N189 Chronic kidney disease, unspecified: Secondary | ICD-10-CM | POA: Diagnosis not present

## 2017-02-02 LAB — POCT INR: INR: 2.3

## 2017-02-05 ENCOUNTER — Ambulatory Visit: Payer: Medicare HMO | Admitting: Endocrinology

## 2017-02-06 DIAGNOSIS — R269 Unspecified abnormalities of gait and mobility: Secondary | ICD-10-CM | POA: Diagnosis not present

## 2017-02-06 DIAGNOSIS — I259 Chronic ischemic heart disease, unspecified: Secondary | ICD-10-CM | POA: Diagnosis not present

## 2017-02-06 DIAGNOSIS — I509 Heart failure, unspecified: Secondary | ICD-10-CM | POA: Diagnosis not present

## 2017-02-06 DIAGNOSIS — J40 Bronchitis, not specified as acute or chronic: Secondary | ICD-10-CM | POA: Diagnosis not present

## 2017-02-06 DIAGNOSIS — N19 Unspecified kidney failure: Secondary | ICD-10-CM | POA: Diagnosis not present

## 2017-02-06 DIAGNOSIS — E109 Type 1 diabetes mellitus without complications: Secondary | ICD-10-CM | POA: Diagnosis not present

## 2017-02-08 DIAGNOSIS — E1122 Type 2 diabetes mellitus with diabetic chronic kidney disease: Secondary | ICD-10-CM | POA: Diagnosis not present

## 2017-02-08 DIAGNOSIS — I509 Heart failure, unspecified: Secondary | ICD-10-CM | POA: Diagnosis not present

## 2017-02-08 DIAGNOSIS — G8929 Other chronic pain: Secondary | ICD-10-CM | POA: Diagnosis not present

## 2017-02-08 DIAGNOSIS — K599 Functional intestinal disorder, unspecified: Secondary | ICD-10-CM | POA: Diagnosis not present

## 2017-02-08 DIAGNOSIS — R69 Illness, unspecified: Secondary | ICD-10-CM | POA: Diagnosis not present

## 2017-02-08 DIAGNOSIS — M47812 Spondylosis without myelopathy or radiculopathy, cervical region: Secondary | ICD-10-CM | POA: Diagnosis not present

## 2017-02-08 DIAGNOSIS — I2510 Atherosclerotic heart disease of native coronary artery without angina pectoris: Secondary | ICD-10-CM | POA: Diagnosis not present

## 2017-02-08 DIAGNOSIS — J45909 Unspecified asthma, uncomplicated: Secondary | ICD-10-CM | POA: Diagnosis not present

## 2017-02-08 DIAGNOSIS — R32 Unspecified urinary incontinence: Secondary | ICD-10-CM | POA: Diagnosis not present

## 2017-02-08 DIAGNOSIS — N189 Chronic kidney disease, unspecified: Secondary | ICD-10-CM | POA: Diagnosis not present

## 2017-02-09 ENCOUNTER — Encounter: Payer: Self-pay | Admitting: Endocrinology

## 2017-02-09 ENCOUNTER — Ambulatory Visit: Payer: Medicare HMO | Admitting: Endocrinology

## 2017-02-09 ENCOUNTER — Other Ambulatory Visit (INDEPENDENT_AMBULATORY_CARE_PROVIDER_SITE_OTHER): Payer: Medicare HMO

## 2017-02-09 ENCOUNTER — Ambulatory Visit (INDEPENDENT_AMBULATORY_CARE_PROVIDER_SITE_OTHER): Payer: Medicare HMO | Admitting: Endocrinology

## 2017-02-09 VITALS — BP 110/66 | HR 65 | Ht 60.0 in

## 2017-02-09 DIAGNOSIS — E1165 Type 2 diabetes mellitus with hyperglycemia: Secondary | ICD-10-CM

## 2017-02-09 LAB — COMPREHENSIVE METABOLIC PANEL
ALT: 9 U/L (ref 0–35)
AST: 19 U/L (ref 0–37)
Albumin: 3.1 g/dL — ABNORMAL LOW (ref 3.5–5.2)
Alkaline Phosphatase: 90 U/L (ref 39–117)
BILIRUBIN TOTAL: 0.5 mg/dL (ref 0.2–1.2)
BUN: 13 mg/dL (ref 6–23)
CO2: 31 mEq/L (ref 19–32)
Calcium: 8.8 mg/dL (ref 8.4–10.5)
Chloride: 100 mEq/L (ref 96–112)
Creatinine, Ser: 1.17 mg/dL (ref 0.40–1.20)
GFR: 55.32 mL/min — AB (ref >60.00)
GLUCOSE: 173 mg/dL — AB (ref 70–99)
Potassium: 3.8 mEq/L (ref 3.5–5.1)
SODIUM: 137 meq/L (ref 135–145)
Total Protein: 6.6 g/dL (ref 6.0–8.3)

## 2017-02-09 LAB — LIPID PANEL
CHOL/HDL RATIO: 5
Cholesterol: 152 mg/dL (ref 0–200)
HDL: 31.7 mg/dL — ABNORMAL LOW (ref >39.00)
NONHDL: 120.68
TRIGLYCERIDES: 262 mg/dL — AB (ref 0.0–149.0)
VLDL: 52.4 mg/dL — ABNORMAL HIGH (ref 0.0–40.0)

## 2017-02-09 LAB — POCT GLYCOSYLATED HEMOGLOBIN (HGB A1C): Hemoglobin A1C: 7.7

## 2017-02-09 LAB — TSH: TSH: 5.74 u[IU]/mL — ABNORMAL HIGH (ref 0.35–4.50)

## 2017-02-09 LAB — LDL CHOLESTEROL, DIRECT: Direct LDL: 73 mg/dL

## 2017-02-09 NOTE — Progress Notes (Signed)
Patient ID: [name], female   DOB: Dec 15, 1920, 84 y.o.   MRN: [id number]   Reason for Appointment:  follow-up   History of Present Illness   Diagnosis: Type 2 diabetes mellitus, date of diagnosis: 1972.   She had been on insulin for several years and this had been tapered off in 2013 because of weight loss and lower blood sugars. Subsequently blood sugars had been generally reasonably good.  Because of her age and multiple medical problems she has been monitored without insulin  Recent history:  Her A1c is still relatively high at 7.7, has been as low as 6%  Oral hypoglycemic drugs use: None  Current management, blood sugar patterns and problems:  She did  bring her monitor for download but she is using the new meter only for the last 10 days  Also checking blood sugars mostly before suppertime and sporadically late morning  With the new meter she apparently may get random blood sugars over 200 or 300 but if she checks them on the other hand they are lower  Despite reminders he does not check sugars after supper although some evenings she has blood sugars later in the evening  Her daughter feels that she is eating a balanced meal in the evening, usually eating most of it  In the morning she is usually eating toast, eggs and bacon  No readings after breakfast but her blood sugar at noon last month was 197 in the lab  She will occasionally will have small amount of regular soft drink such as lemonade, have discussed that this is okay   Also sometimes will have Glucerna if she does not eat  Her weight appears to have gone up significantly    Recent blood sugar readings by download of One Touch Verio monitor:   PRE-MEAL Fasting Lunch Dinner Bedtime Overall  Glucose range: 112-133   144-175  ?   Mean/median:         Meals: 2 meals at about noon and 7 pm; sometimes has lemonade or sprite. Has small portions, has some grapes for snacks in the afternoon    Dietician visit: Most recent:2001.     Wt Readings from Last 3 Encounters:  01/25/17 181 lb 3.2 oz (82.2 kg)  11/28/16 171 lb (77.6 kg)  09/20/16 166 lb 8 oz (75.5 kg)   Lab Results  Component Value Date   HGBA1C 7.8 10/13/2016   HGBA1C 6.3 03/02/2016   HGBA1C 6.0 12/01/2015   Lab Results  Component Value Date   LDLCALC 141 (H) 10/25/2015   CREATININE 1.56 (H) 01/02/2017   Lab Results  Component Value Date   FRUCTOSAMINE 371 (H) 07/07/2015   FRUCTOSAMINE 274 (H) 03/24/2013     Allergies as of 02/09/2017      Reactions   Macrobid [nitrofurantoin] Rash, Cough   Wheezing (also) THIS REACTION RESULTED IN THE PATIENT ENDING UP IN THE E.D.   Darvon Other (See Comments)   Causes confusion   Digoxin And Related Nausea Only   Penicillins Other (See Comments)   From childhood: Has patient had a PCN reaction causing immediate rash, facial/tongue/throat swelling, SOB or lightheadedness with hypotension: Unknown Has patient had a PCN reaction causing severe rash involving mucus membranes or skin necrosis: Unknown Has patient had a PCN reaction that required hospitalization: Unknown Has patient had a PCN reaction occurring within the last 10 years: No If all of the above answers are "NO", then may proceed with Cephalosporin use.  Percocet [oxycodone-acetaminophen] Other (See Comments)   Causes confusion   Percodan [oxycodone-aspirin] Other (See Comments)   Causes confusion   Vicodin [hydrocodone-acetaminophen] Other (See Comments)   Causes confusion      Medication List        Accurate as of 02/09/17 11:49 AM. Always use your most recent med list.          ACCU-CHEK AVIVA PLUS w/Device Kit Use to check sugars 2 times daily.   ACCU-CHEK FASTCLIX LANCETS Misc Use to check sugar 2 times daily   acetaminophen 500 MG tablet Commonly known as:  TYLENOL Take 500 mg by mouth every 6 (six) hours as needed (pain).   acetaminophen-codeine 300-30 MG tablet Commonly known as:   TYLENOL #3 Take 1 tablet by mouth every 6 (six) hours as needed for severe pain.   AMITIZA 24 MCG capsule Generic drug:  lubiprostone Take 1 capsule (24 mcg total) by mouth 2 (two) times daily with a meal.   ARGININE PO Take 1 capsule by mouth daily.   atropine 1 % ophthalmic solution Place 1 drop into both eyes daily as needed (burning eyes).   docusate sodium 100 MG capsule Commonly known as:  COLACE Take 100 mg daily by mouth.   donepezil 5 MG tablet Commonly known as:  ARICEPT Take 5 mg by mouth at bedtime.   folic acid 1 MG tablet Commonly known as:  FOLVITE Take 1 tablet (1 mg total) by mouth daily.   furosemide 20 MG tablet Commonly known as:  LASIX Take 1-2 tablets (20-40 mg total) by mouth daily as directed.   gabapentin 100 MG capsule Commonly known as:  NEURONTIN Take 100 mg by mouth 2 (two) times daily.   glipiZIDE 5 MG tablet Commonly known as:  GLUCOTROL TAKE 1/2 PILL DAILY   glucose blood test strip Use to test blood sugar twice daily   levalbuterol 0.63 MG/3ML nebulizer solution Commonly known as:  XOPENEX Take 3 mLs by nebulization 3 (three) times daily as needed for wheezing or shortness of breath.   loratadine 10 MG tablet Commonly known as:  CLARITIN Take 10 mg by mouth daily.   magnesium citrate Soln Take 1 Bottle by mouth every 7 (seven) days.   meclizine 25 MG tablet Commonly known as:  ANTIVERT Take 25 mg by mouth 3 (three) times daily as needed for dizziness.   MUCUS RELIEF CHEST CONGESTION 400 MG Tabs tablet Generic drug:  guaifenesin Take 400 mg by mouth 2 (two) times daily as needed (cough/ congestion).   ofloxacin 0.3 % ophthalmic solution Commonly known as:  OCUFLOX Place 1 drop into both eyes 4 (four) times daily as needed (for drainage).   omeprazole 40 MG capsule Commonly known as:  PRILOSEC Take 1 capsule (40 mg total) by mouth 2 (two) times daily.   OVER THE COUNTER MEDICATION Take 1 Can by mouth daily. Enterex  nutritional supplement - with choline   OVER THE COUNTER MEDICATION Kidney supplement capsules (Choline 100 mg, trimethylglycine 50 mg, glutathione 50 mg, methyltetrahydrofolate, l-arginine-, l-ornithine, l-citruline): Take 1 capsule by mouth two to three times a week   Potassium Chloride CR 8 MEQ Cpcr capsule CR Commonly known as:  MICRO-K TAKE 1 CAPSULE  DAILY.   PROAIR HFA 108 (90 Base) MCG/ACT inhaler Generic drug:  albuterol Inhale 2 puffs into the lungs every 6 (six) hours as needed for wheezing or shortness of breath.   REFRESH OP Place 1 drop into both eyes daily as needed (dry eyes).  rosuvastatin 20 MG tablet Commonly known as:  CRESTOR Take 1 tablet (20 mg total) by mouth daily.   SYNTHROID 125 MCG tablet Generic drug:  levothyroxine take 1/2 tablet by mouth once daily   traMADol 50 MG tablet Commonly known as:  ULTRAM Take 1 tablet (50 mg total) by mouth 2 (two) times daily as needed for moderate pain.   warfarin 3 MG tablet Commonly known as:  COUMADIN Take as directed by the anticoagulation clinic. If you are unsure how to take this medication, talk to your nurse or doctor. Original instructions:  TAKE 1 TABLET TO 1 AND 1/2 TABLETS EVERY DAY AS DIRECTED BY COUMADIN CLINIC.   zolpidem 10 MG tablet Commonly known as:  AMBIEN Take 5-10 mg by mouth at bedtime as needed for sleep.       Past Medical History:  Diagnosis Date  . 2nd degree atrioventricular block    a. 06/22/15 Gen change: MDT ADDR01 Adapta, DC PPM (ser# [id number]).  . Allergy to perfume   . Anemia   . Arthritis   . Chronic combined systolic and diastolic CHF (congestive heart failure) ([place])    a. 01/2014 Echo: EF @ least mod-sev reduced with HK of lat/apical, basal inf walls. basalpost AK, Gr 1DD; b. 06/2015 Echo: EF 45-50%, Gr2 DD, mod LVH.   . CKD (chronic kidney disease), stage III ([place])    a. iii - iv.  . Coronary artery disease    a. 1994 s/p cabg;  b. 09/2013 MV: large, sev intensity,  partially reversible inf, apical defect, prior inf/ap infarct w/ mild peri-infarct ischemia->Med Rx; c. 06/2015 NSTEMI (trop 6)->Med rx.  . Daily headache   . DVT of upper extremity (deep vein thrombosis) ([place]) 06/13/2012   BUE  . Dyspepsia   . Gastric ulcer   . GERD (gastroesophageal reflux disease)   . H/O: GI bleed 12//13  . Hiatal hernia   . High cholesterol   . History of blood transfusion 2013  . Hypertensive heart disease   . Hypothyroid   . Legally blind   . PAF (paroxysmal atrial fibrillation) ([facility])    a. CHA2DS2VASc = 7-->coumadin.  . Presence of permanent cardiac pacemaker    a. 06/22/15 Gen change: MDT ADDR01 Adapta, DC PPM (ser# [id number]).  . Seasonal allergies   . Type II diabetes mellitus ([place])   . UTI (lower urinary tract infection)     Past Surgical History:  Procedure Laterality Date  . CARDIAC CATHETERIZATION  10/25/92  . CARDIAC CATHETERIZATION  11/18/03   w/grafts 100%CX LAD 80 & 100%  . CARDIAC CATHETERIZATION  01/24/05   diffuse disease of native vessels  . CARDIAC CATHETERIZATION  06/06/06   severe native CAD  . CARDIOVERSION  06/06/06   successful  . CATARACT EXTRACTION W/ INTRAOCULAR LENS  IMPLANT, BILATERAL Bilateral   . CORONARY ARTERY BYPASS GRAFT  10/27/92   LIMA to LAD,SVG to LAD second diagonal,obtuse maraginal of the CX and posterior descendingbranch of the RCA  . EP IMPLANTABLE DEVICE N/A 06/22/2015   Procedure: PPM/BIV PPM Generator Changeout;  Surgeon: Sanda Klein, MD;  Location: [facility];  Service: Cardiovascular;  Laterality: N/A;  . ESOPHAGOGASTRODUODENOSCOPY  12/22/2011   Procedure: ESOPHAGOGASTRODUODENOSCOPY (EGD);  Surgeon: Gatha Mayer, MD;  Location: [facility];  Service: Endoscopy;  Laterality: N/A;  . INSERT / REPLACE / REMOVE PACEMAKER  06/29/2006   Medtronic adapta  . REFRACTIVE SURGERY Bilateral     Family History  Problem Relation Age of Onset  .  Heart disease Father   . Diabetes Father   . Breast cancer  Daughter   . Diabetes Son   . Diabetes Daughter   . Colon polyps Daughter   . Heart attack Brother   . Diabetes Brother   . Stroke Sister   . Colon cancer Neg Hx     Social History:  reports that  has never smoked. she has never used smokeless tobacco. She reports that she does not drink alcohol or use drugs.  Allergies:  Allergies  Allergen Reactions  . Macrobid [Nitrofurantoin] Rash and Cough    Wheezing (also) THIS REACTION RESULTED IN THE PATIENT ENDING UP IN THE E.D.  . Darvon Other (See Comments)    Causes confusion  . Digoxin And Related Nausea Only  . Penicillins Other (See Comments)    From childhood: Has patient had a PCN reaction causing immediate rash, facial/tongue/throat swelling, SOB or lightheadedness with hypotension: Unknown Has patient had a PCN reaction causing severe rash involving mucus membranes or skin necrosis: Unknown Has patient had a PCN reaction that required hospitalization: Unknown Has patient had a PCN reaction occurring within the last 10 years: No If all of the above answers are "NO", then may proceed with Cephalosporin use.   Marland Kitchen Percocet [Oxycodone-Acetaminophen] Other (See Comments)    Causes confusion  . Percodan [Oxycodone-Aspirin] Other (See Comments)    Causes confusion  . Vicodin [Hydrocodone-Acetaminophen] Other (See Comments)    Causes confusion    REVIEW of systems:  Hypothyroidism: She is taking levothyroxine  62.5 mcg daily  TSH last done in August Pending from today  Lab Results  Component Value Date   TSH 1.560 09/08/2016   TSH 1.68 03/02/2016   TSH 2.02 12/01/2015   FREET4 0.70 06/25/2014   FREET4 0.65 06/24/2013   FREET4 0.93 01/24/2013     She has had renal dysfunction   Lab Results  Component Value Date   CREATININE 1.56 (H) 01/02/2017   CREATININE 1.50 (H) 09/11/2016   CREATININE 1.75 (H) 09/10/2016     She has significant neuropathy with sensory loss on previous  foot exam.   She has had  hyperlipidemia  followed by PCP No recent labs available from PCP office   Lab Results  Component Value Date   CHOL 222 (H) 12/01/2015   HDL 32.20 (L) 12/01/2015   LDLCALC 141 (H) 10/25/2015   LDLDIRECT 132.0 03/02/2016   TRIG 244.0 (H) 12/01/2015   CHOLHDL 7 12/01/2015      Examination:   BP 110/66   Pulse 65   Ht 5' (1.524 m)   SpO2 98%   BMI 35.39 kg/m   Body mass index is 35.39 kg/m.    Assesment/plan:   1.  Diabetes type 2, with obesity, long-standing   See history of present illness for  discussion of current diabetes management, blood sugar patterns and problems identified  Her A1c is again slightly higher at 7.7, has been as low as 6% before  Since her blood sugars before meals are usually not over about 180 she may be getting somewhat higher readings after meals  Discussed with the daughter that her blood sugars are still relatively acceptable for her age and medical conditions but she wants to get better control She can try taking half a tablet of glipizide 5 mg just before supper time daily and try to see what her blood sugars are at bedtime She will need to stop this if she has any low sugars.   Does  not need to check sugars before evening meal  2.  Hypothyroidism: On low-dose supplementation, to have TSH checked today   Elayne Snare 02/09/2017, 11:49 AM        .

## 2017-02-09 NOTE — Patient Instructions (Signed)
1/2 Glipizide before supper  Check after supper not before

## 2017-02-13 ENCOUNTER — Ambulatory Visit: Payer: Medicare HMO | Admitting: Endocrinology

## 2017-02-13 DIAGNOSIS — K599 Functional intestinal disorder, unspecified: Secondary | ICD-10-CM | POA: Diagnosis not present

## 2017-02-13 DIAGNOSIS — N189 Chronic kidney disease, unspecified: Secondary | ICD-10-CM | POA: Diagnosis not present

## 2017-02-13 DIAGNOSIS — E1122 Type 2 diabetes mellitus with diabetic chronic kidney disease: Secondary | ICD-10-CM | POA: Diagnosis not present

## 2017-02-13 DIAGNOSIS — G8929 Other chronic pain: Secondary | ICD-10-CM | POA: Diagnosis not present

## 2017-02-13 DIAGNOSIS — I509 Heart failure, unspecified: Secondary | ICD-10-CM | POA: Diagnosis not present

## 2017-02-13 DIAGNOSIS — M47812 Spondylosis without myelopathy or radiculopathy, cervical region: Secondary | ICD-10-CM | POA: Diagnosis not present

## 2017-02-13 DIAGNOSIS — I2510 Atherosclerotic heart disease of native coronary artery without angina pectoris: Secondary | ICD-10-CM | POA: Diagnosis not present

## 2017-02-13 DIAGNOSIS — R69 Illness, unspecified: Secondary | ICD-10-CM | POA: Diagnosis not present

## 2017-02-13 DIAGNOSIS — R32 Unspecified urinary incontinence: Secondary | ICD-10-CM | POA: Diagnosis not present

## 2017-02-13 DIAGNOSIS — J45909 Unspecified asthma, uncomplicated: Secondary | ICD-10-CM | POA: Diagnosis not present

## 2017-02-16 ENCOUNTER — Telehealth: Payer: Self-pay | Admitting: Endocrinology

## 2017-02-16 ENCOUNTER — Other Ambulatory Visit: Payer: Self-pay

## 2017-02-16 DIAGNOSIS — N189 Chronic kidney disease, unspecified: Secondary | ICD-10-CM | POA: Diagnosis not present

## 2017-02-16 DIAGNOSIS — J45909 Unspecified asthma, uncomplicated: Secondary | ICD-10-CM | POA: Diagnosis not present

## 2017-02-16 DIAGNOSIS — G8929 Other chronic pain: Secondary | ICD-10-CM | POA: Diagnosis not present

## 2017-02-16 DIAGNOSIS — K599 Functional intestinal disorder, unspecified: Secondary | ICD-10-CM | POA: Diagnosis not present

## 2017-02-16 DIAGNOSIS — M47812 Spondylosis without myelopathy or radiculopathy, cervical region: Secondary | ICD-10-CM | POA: Diagnosis not present

## 2017-02-16 DIAGNOSIS — R32 Unspecified urinary incontinence: Secondary | ICD-10-CM | POA: Diagnosis not present

## 2017-02-16 DIAGNOSIS — I509 Heart failure, unspecified: Secondary | ICD-10-CM | POA: Diagnosis not present

## 2017-02-16 DIAGNOSIS — I2510 Atherosclerotic heart disease of native coronary artery without angina pectoris: Secondary | ICD-10-CM | POA: Diagnosis not present

## 2017-02-16 DIAGNOSIS — R69 Illness, unspecified: Secondary | ICD-10-CM | POA: Diagnosis not present

## 2017-02-16 DIAGNOSIS — E1122 Type 2 diabetes mellitus with diabetic chronic kidney disease: Secondary | ICD-10-CM | POA: Diagnosis not present

## 2017-02-16 MED ORDER — LEVOTHYROXINE SODIUM 125 MCG PO TABS: 62.5000 ug | ORAL_TABLET | Freq: Every day | ORAL | 2 refills | Status: DC

## 2017-02-16 NOTE — Telephone Encounter (Signed)
Done

## 2017-02-16 NOTE — Telephone Encounter (Signed)
Patient daughter stated that her mom need her medication for synthroid today, she has already missed a dosage. Please advise [facility] [address] [place], [place] [facility] [address] [phone] (Phone) [phone] (Fax)

## 2017-02-16 NOTE — Telephone Encounter (Signed)
Patient needs script for Synthroid sent to [facility] on [address] asap (she is out of med) Needs today (Her PCP prescribed the wrong dosage on her synthroid-so she needs script with correct dosage prescribed by Dr Dwyane Dee)

## 2017-02-22 DIAGNOSIS — M47812 Spondylosis without myelopathy or radiculopathy, cervical region: Secondary | ICD-10-CM | POA: Diagnosis not present

## 2017-02-22 DIAGNOSIS — G8929 Other chronic pain: Secondary | ICD-10-CM | POA: Diagnosis not present

## 2017-02-22 DIAGNOSIS — R32 Unspecified urinary incontinence: Secondary | ICD-10-CM | POA: Diagnosis not present

## 2017-02-22 DIAGNOSIS — I2510 Atherosclerotic heart disease of native coronary artery without angina pectoris: Secondary | ICD-10-CM | POA: Diagnosis not present

## 2017-02-22 DIAGNOSIS — E1122 Type 2 diabetes mellitus with diabetic chronic kidney disease: Secondary | ICD-10-CM | POA: Diagnosis not present

## 2017-02-22 DIAGNOSIS — I509 Heart failure, unspecified: Secondary | ICD-10-CM | POA: Diagnosis not present

## 2017-02-22 DIAGNOSIS — K599 Functional intestinal disorder, unspecified: Secondary | ICD-10-CM | POA: Diagnosis not present

## 2017-02-22 DIAGNOSIS — J45909 Unspecified asthma, uncomplicated: Secondary | ICD-10-CM | POA: Diagnosis not present

## 2017-02-22 DIAGNOSIS — N189 Chronic kidney disease, unspecified: Secondary | ICD-10-CM | POA: Diagnosis not present

## 2017-02-22 DIAGNOSIS — R69 Illness, unspecified: Secondary | ICD-10-CM | POA: Diagnosis not present

## 2017-02-27 ENCOUNTER — Telehealth: Payer: Self-pay | Admitting: Cardiovascular Disease

## 2017-02-27 NOTE — Telephone Encounter (Signed)
New message     Needs order for skilled nursing that was sent on 02/23/17 faxed back to them   Fax [phone] attn Alan Mulder

## 2017-03-01 DIAGNOSIS — M47812 Spondylosis without myelopathy or radiculopathy, cervical region: Secondary | ICD-10-CM | POA: Diagnosis not present

## 2017-03-01 DIAGNOSIS — G8929 Other chronic pain: Secondary | ICD-10-CM | POA: Diagnosis not present

## 2017-03-01 DIAGNOSIS — I2510 Atherosclerotic heart disease of native coronary artery without angina pectoris: Secondary | ICD-10-CM | POA: Diagnosis not present

## 2017-03-01 DIAGNOSIS — R32 Unspecified urinary incontinence: Secondary | ICD-10-CM | POA: Diagnosis not present

## 2017-03-01 DIAGNOSIS — R69 Illness, unspecified: Secondary | ICD-10-CM | POA: Diagnosis not present

## 2017-03-01 DIAGNOSIS — K599 Functional intestinal disorder, unspecified: Secondary | ICD-10-CM | POA: Diagnosis not present

## 2017-03-01 DIAGNOSIS — E1122 Type 2 diabetes mellitus with diabetic chronic kidney disease: Secondary | ICD-10-CM | POA: Diagnosis not present

## 2017-03-01 DIAGNOSIS — J45909 Unspecified asthma, uncomplicated: Secondary | ICD-10-CM | POA: Diagnosis not present

## 2017-03-01 DIAGNOSIS — I509 Heart failure, unspecified: Secondary | ICD-10-CM | POA: Diagnosis not present

## 2017-03-01 DIAGNOSIS — N189 Chronic kidney disease, unspecified: Secondary | ICD-10-CM | POA: Diagnosis not present

## 2017-03-07 ENCOUNTER — Telehealth: Payer: Self-pay | Admitting: Gastroenterology

## 2017-03-07 NOTE — Telephone Encounter (Signed)
Patient daughter calling to see if patient can get samples of medication of amatiza.

## 2017-03-07 NOTE — Telephone Encounter (Signed)
Left message for patient that samples of Amitiza are out front for her to pick up

## 2017-03-08 ENCOUNTER — Telehealth: Payer: Self-pay | Admitting: Cardiovascular Disease

## 2017-03-08 ENCOUNTER — Encounter: Payer: Self-pay | Admitting: Cardiovascular Disease

## 2017-03-08 ENCOUNTER — Ambulatory Visit (INDEPENDENT_AMBULATORY_CARE_PROVIDER_SITE_OTHER): Payer: Medicare HMO | Admitting: Cardiovascular Disease

## 2017-03-08 ENCOUNTER — Ambulatory Visit: Payer: Medicare HMO | Admitting: Cardiovascular Disease

## 2017-03-08 VITALS — BP 106/54 | HR 66 | Ht 60.0 in

## 2017-03-08 DIAGNOSIS — Z7901 Long term (current) use of anticoagulants: Secondary | ICD-10-CM

## 2017-03-08 DIAGNOSIS — E1169 Type 2 diabetes mellitus with other specified complication: Secondary | ICD-10-CM

## 2017-03-08 DIAGNOSIS — I25118 Atherosclerotic heart disease of native coronary artery with other forms of angina pectoris: Secondary | ICD-10-CM

## 2017-03-08 DIAGNOSIS — I442 Atrioventricular block, complete: Secondary | ICD-10-CM | POA: Diagnosis not present

## 2017-03-08 DIAGNOSIS — I5042 Chronic combined systolic (congestive) and diastolic (congestive) heart failure: Secondary | ICD-10-CM | POA: Diagnosis not present

## 2017-03-08 DIAGNOSIS — E782 Mixed hyperlipidemia: Secondary | ICD-10-CM

## 2017-03-08 DIAGNOSIS — E669 Obesity, unspecified: Secondary | ICD-10-CM

## 2017-03-08 DIAGNOSIS — I480 Paroxysmal atrial fibrillation: Secondary | ICD-10-CM

## 2017-03-08 DIAGNOSIS — Z950 Presence of cardiac pacemaker: Secondary | ICD-10-CM | POA: Diagnosis not present

## 2017-03-08 NOTE — Telephone Encounter (Signed)
New Message   Pt wants to know if it would be ok to reschedule her appt to 3/28 with Lauretta Chester because of the weather today

## 2017-03-08 NOTE — Telephone Encounter (Signed)
Patient's appointment has been cancelled for today and rescheduled with Rosaria Ferries, PA for 3/28 at 10 am.

## 2017-03-08 NOTE — Patient Instructions (Signed)
Dr Sallyanne Kuster recommends that you continue on your current medications as directed. Please refer to the Current Medication list given to you today.  Remote monitoring is used to monitor your Pacemaker of ICD from home. This monitoring reduces the number of office visits required to check your device to one time per year. It allows [place] to keep an eye on the functioning of your device to ensure it is working properly. You are scheduled for a device check from home on Thursday, May 23rd, 2019. You may send your transmission at any time that day. If you have a wireless device, the transmission will be sent automatically. After your physician reviews your transmission, you will receive a postcard with your next transmission date.  Dr Sallyanne Kuster recommends that you schedule a follow-up appointment in 6 months with a pacemaker check. You will receive a reminder letter in the mail two months in advance. If you don't receive a letter, please call our office to schedule the follow-up appointment.  If you need a refill on your cardiac medications before your next appointment, please call your pharmacy.

## 2017-03-08 NOTE — Progress Notes (Signed)
Cardiology Office Note    Date:  03/11/2017   ID:  [name], DOB [birth year], MRN [id number]  PCP:  [name], MD  Cardiologist:   Sanda Klein, MD   Chief complaint: pacemaker follow up   History of Present Illness:  [name] is a 84 y.o. 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  Currently doing well without complaints of syncope, palpitations, bleeding, new focal neurological complaints, dyspnea or chest pain.  Her blood pressure is not as low as it was at her previous appointment.  Weight 177 pounds using her home scale, yesterday. She continues to weigh with her wheelchair on the special scale at home (add 35 pounds for her wheelchair).  Has not required repeat hospitalization since last August when she had a urinary infection.  No cardiovascular events since her hospitalization with a small NSTEMI in July 2018, treated conservatively in view of advanced age and renal insufficiency. The ventricular systolic function was unchanged at 45-50%. She does have a history of GI bleeding, none recently.   Pacemaker interrogation in office today shows normal device function.  There has been no atrial fibrillation since the last device check.  There have been 26 episodes of paroxysmal atrial tachycardia, in aggregate all under 20 minutes, the vast majority occurring in one day, last November.  She only has 4% atrial pacing and virtually 100% ventricular pacing.  Generator longevity is estimated at 10 years.  She is pacemaker dependent with 100% ventricular paced rhythm.   Past Medical History:    Diagnosis Date  . 2nd degree atrioventricular block    a. 06/22/15 Gen change: MDT ADDR01 Adapta, DC PPM (ser# [id number]).  . Allergy to perfume   . Anemia   . Arthritis   . Chronic combined systolic and diastolic CHF (congestive heart failure) ([place])    a. 01/2014 Echo: EF @ least mod-sev reduced with HK of lat/apical, basal inf walls. basalpost AK, Gr 1DD; b. 06/2015 Echo: EF 45-50%, Gr2 DD, mod LVH.   . CKD (chronic kidney disease), stage III ([place])    a. iii - iv.  . Coronary artery disease    a. 1994 s/p cabg;  b. 09/2013 MV: large, sev intensity, partially reversible inf, apical defect, prior inf/ap infarct w/ mild peri-infarct ischemia->Med Rx; c. 06/2015 NSTEMI (trop 6)->Med rx.  . Daily headache   . DVT of upper extremity (deep vein thrombosis) ([place]) 06/13/2012   BUE  . Dyspepsia   . Gastric ulcer   . GERD (gastroesophageal reflux disease)   . H/O: GI bleed 12//13  . Hiatal hernia   . High cholesterol   . History of blood transfusion 2013  . Hypertensive heart disease   . Hypothyroid   . Legally blind   . PAF (paroxysmal atrial fibrillation) ([facility])    a. CHA2DS2VASc = 7-->coumadin.  . Presence of permanent cardiac pacemaker    a. 06/22/15 Gen change: MDT ADDR01 Adapta, DC PPM (ser# [id number]).  . Seasonal allergies   . Type II diabetes mellitus ([place])   . UTI (lower urinary tract infection)     Past Surgical History:  Procedure Laterality Date  . CARDIAC CATHETERIZATION  10/25/92  . CARDIAC CATHETERIZATION  11/18/03  w/grafts 100%CX LAD 80 & 100%  . CARDIAC CATHETERIZATION  01/24/05   diffuse disease of native vessels  . CARDIAC CATHETERIZATION  06/06/06   severe native CAD  . CARDIOVERSION  06/06/06   successful  . CATARACT EXTRACTION W/ INTRAOCULAR LENS  IMPLANT, BILATERAL Bilateral   . CORONARY ARTERY BYPASS GRAFT  10/27/92   LIMA to LAD,SVG to LAD second diagonal,obtuse maraginal of the CX and posterior descendingbranch of the RCA  . EP IMPLANTABLE DEVICE N/A 06/22/2015    Procedure: PPM/BIV PPM Generator Changeout;  Surgeon: Sanda Klein, MD;  Location: [facility];  Service: Cardiovascular;  Laterality: N/A;  . ESOPHAGOGASTRODUODENOSCOPY  12/22/2011   Procedure: ESOPHAGOGASTRODUODENOSCOPY (EGD);  Surgeon: Gatha Mayer, MD;  Location: [facility];  Service: Endoscopy;  Laterality: N/A;  . INSERT / REPLACE / REMOVE PACEMAKER  06/29/2006   Medtronic adapta  . REFRACTIVE SURGERY Bilateral     Current Medications: Outpatient Medications Prior to Visit  Medication Sig Dispense Refill  . ACCU-CHEK FASTCLIX LANCETS MISC Use to check sugar 2 times daily 200 each 11  . acetaminophen (TYLENOL) 500 MG tablet Take 500 mg by mouth every 6 (six) hours as needed (pain).    Marland Kitchen acetaminophen-codeine (TYLENOL #3) 300-30 MG tablet Take 1 tablet by mouth every 6 (six) hours as needed for severe pain.  0  . albuterol (PROAIR HFA) 108 (90 Base) MCG/ACT inhaler Inhale 2 puffs into the lungs every 6 (six) hours as needed for wheezing or shortness of breath.    . AMITIZA 24 MCG capsule Take 1 capsule (24 mcg total) by mouth 2 (two) times daily with a meal. 60 capsule 3  . ARGININE PO Take 1 capsule by mouth daily.    Marland Kitchen atropine 1 % ophthalmic solution Place 1 drop into both eyes daily as needed (burning eyes).   0  . Blood Glucose Monitoring Suppl (ACCU-CHEK AVIVA PLUS) w/Device KIT Use to check sugars 2 times daily. 1 kit 0  . docusate sodium (COLACE) 100 MG capsule Take 100 mg daily by mouth.    . donepezil (ARICEPT) 5 MG tablet Take 5 mg by mouth at bedtime.   0  . folic acid (FOLVITE) 1 MG tablet Take 1 tablet (1 mg total) by mouth daily. 30 tablet 10  . furosemide (LASIX) 20 MG tablet Take 1-2 tablets (20-40 mg total) by mouth daily as directed. 135 tablet 3  . gabapentin (NEURONTIN) 100 MG capsule Take 100 mg by mouth 2 (two) times daily.     Marland Kitchen glipiZIDE (GLUCOTROL) 5 MG tablet TAKE 1/2 PILL DAILY 45 tablet 1  . glucose blood test strip Use to test blood sugar twice  daily 100 each 4  . guaifenesin (MUCUS RELIEF CHEST CONGESTION) 400 MG TABS tablet Take 400 mg by mouth 2 (two) times daily as needed (cough/ congestion).    Marland Kitchen levalbuterol (XOPENEX) 0.63 MG/3ML nebulizer solution Take 3 mLs by nebulization 3 (three) times daily as needed for wheezing or shortness of breath.   0  . levothyroxine (SYNTHROID) 125 MCG tablet Take 0.5 tablets (62.5 mcg total) by mouth daily. 45 tablet 2  . loratadine (CLARITIN) 10 MG tablet Take 10 mg by mouth daily.    . magnesium citrate SOLN Take 1 Bottle by mouth every 7 (seven) days.    . meclizine (ANTIVERT) 25 MG tablet Take 25 mg by mouth 3 (three) times daily as needed for dizziness.    Marland Kitchen ofloxacin (OCUFLOX) 0.3 % ophthalmic solution Place 1 drop  into both eyes 4 (four) times daily as needed (for drainage).   0  . omeprazole (PRILOSEC) 40 MG capsule Take 1 capsule (40 mg total) by mouth 2 (two) times daily. 60 capsule 3  . OVER THE COUNTER MEDICATION Take 1 Can by mouth daily. Enterex nutritional supplement - with choline    . OVER THE COUNTER MEDICATION Kidney supplement capsules (Choline 100 mg, trimethylglycine 50 mg, glutathione 50 mg, methyltetrahydrofolate, l-arginine-, l-ornithine, l-citruline): Take 1 capsule by mouth two to three times a week    . Polyvinyl Alcohol-Povidone (REFRESH OP) Place 1 drop into both eyes daily as needed (dry eyes).     . Potassium Chloride CR (MICRO-K) 8 MEQ CPCR capsule CR TAKE 1 CAPSULE  DAILY. 90 capsule 1  . rosuvastatin (CRESTOR) 20 MG tablet Take 1 tablet (20 mg total) by mouth daily. 90 tablet 3  . traMADol (ULTRAM) 50 MG tablet Take 1 tablet (50 mg total) by mouth 2 (two) times daily as needed for moderate pain.    Marland Kitchen warfarin (COUMADIN) 3 MG tablet TAKE 1 TABLET TO 1 AND 1/2 TABLETS EVERY DAY AS DIRECTED BY COUMADIN CLINIC. 140 tablet 1  . zolpidem (AMBIEN) 10 MG tablet Take 5-10 mg by mouth at bedtime as needed for sleep.   0   No facility-administered medications prior to visit.        Allergies:   Macrobid [nitrofurantoin]; Darvon; Digoxin and related; Penicillins; Percocet [oxycodone-acetaminophen]; Percodan [oxycodone-aspirin]; and Vicodin [hydrocodone-acetaminophen]   Social History   Socioeconomic History  . Marital status: Widowed    Spouse name: None  . Number of children: 10  . Years of education: None  . Highest education level: None  Social Needs  . Financial resource strain: None  . Food insecurity - worry: None  . Food insecurity - inability: None  . Transportation needs - medical: None  . Transportation needs - non-medical: None  Occupational History    Employer: RETIRED  Tobacco Use  . Smoking status: Never Smoker  . Smokeless tobacco: Never Used  Substance and Sexual Activity  . Alcohol use: No    Alcohol/week: 0.0 oz  . Drug use: No  . Sexual activity: No  Other Topics Concern  . None  Social History Narrative  . None     Family History:  The patient's family history includes Breast cancer in her daughter; Colon polyps in her daughter; Diabetes in her brother, daughter, father, and son; Heart attack in her brother; Heart disease in her father; Stroke in her sister.   ROS:   Please see the history of present illness.    ROS All other systems reviewed and are negative.   PHYSICAL EXAM:   VS:  BP (!) 106/54 (BP Location: Right Arm, Patient Position: Sitting, Cuff Size: Normal)   Pulse 66   Ht 5' (1.524 m)   BMI 35.39 kg/m     General: Alert, oriented x3, no distress, obese Head: no evidence of trauma, PERRL, EOMI, no exophtalmos or lid lag, no myxedema, no xanthelasma; normal ears, nose and oropharynx Neck: normal jugular venous pulsations and no hepatojugular reflux; brisk carotid pulses without delay and no carotid bruits Chest: clear to auscultation, no signs of consolidation by percussion or palpation, normal fremitus, symmetrical and full respiratory excursions, the pacemaker site appears healthy Cardiovascular: normal  position and quality of the apical impulse, regular rhythm, normal first and paradoxically split second heart sounds, no murmurs, rubs or gallops Abdomen: no tenderness or distention, no masses by  palpation, no abnormal pulsatility or arterial bruits, normal bowel sounds, no hepatosplenomegaly Extremities: no clubbing, cyanosis or edema; 2+ radial, ulnar and brachial pulses bilaterally; 2+ right femoral, posterior tibial and dorsalis pedis pulses; 2+ left femoral, posterior tibial and dorsalis pedis pulses; no subclavian or femoral bruits Neurological: grossly nonfocal, virtually blind Psych: Normal mood and affect   Wt Readings from Last 3 Encounters:  01/25/17 181 lb 3.2 oz (82.2 kg)  11/28/16 171 lb (77.6 kg)  09/20/16 166 lb 8 oz (75.5 kg)      Studies/Labs Reviewed:   EKG:  EKG is ordered today.   It shows atrial sensed (sinus), ventricular paced rhythm, QTC 538 ms   Recent Labs: 06/19/2016: Magnesium 2.6 09/08/2016: B Natriuretic Peptide 306.1 09/11/2016: Hemoglobin 10.7; Platelets 200 02/09/2017: ALT 9; BUN 13; Creatinine, Ser 1.17; Potassium 3.8; Sodium 137; TSH 5.74   Lipid Panel    Component Value Date/Time   CHOL 152 02/09/2017 1125   TRIG 262.0 (H) 02/09/2017 1125   HDL 31.70 (L) 02/09/2017 1125   CHOLHDL 5 02/09/2017 1125   VLDL 52.4 (H) 02/09/2017 1125   LDLCALC 141 (H) 10/25/2015 1118   LDLDIRECT 73.0 02/09/2017 1125     ASSESSMENT:    1. Chronic combined systolic and diastolic heart failure (HCC)   2. Paroxysmal atrial fibrillation ([place])   3. Long term (current) use of anticoagulants   4. CHB (complete heart block) (H[facility])   5. Pacemaker   6. Coronary artery disease of native artery of native heart with stable angina pectoris ([place])   7. Mixed hyperlipidemia   8. Diabetes mellitus type 2 in obese [facility])      PLAN:  In order of problems listed above:  1. CHF: Despite lower dose of diuretic, she is doing better.  No evidence of heart failure today, although  her exam is very limited by obesity.  Probably can keep her at a "dry weight" of 175-178 pounds on her home scale.  Call us if she develops edema or dyspnea at rest.    Unable to treat with other heart failure medications due to hypotension and kidney disease. 2. Afib: Burden of atrial fibrillation is extremely low.  None has been detected since her last office visit.  He has had some paroxysmal atrial tachycardia.  The episodes are both very infrequent and very brief.  Unfortunately she is at very high embolic risk. CHADSVasc 59 (age 84, CVA 2, CHF, CAD, previous HTN, DM).   3. Warfarin: Without recent bleeding problems. 4. CHB: She is pacemaker dependent.   5. PPM: Normal check today. Plan q3 month device downloads.  Note the unusual pattern on her EKGs with positive R waves in lead V1, V2. Reviewed radiological images and the right ventricular lead is clearly in the right ventricular cavity, not the epicardial veins. It's possible that the tip of the lead is located epicardially/microperforation. 6. CAD s/p CABG: She does have (well controlled) angina pectoris, known reversible ischemia on nuclear stress test and previous non-STEMI.  Continue statin.  Off aspirin due to GI bleeding and concomitant treatment with warfarin. 7. HLP: On statin, LDL very close to target <70. TG elevated, related to DM. 8. DM: Last hemoglobin A1c was 7.7%, but considering her multiple comorbid conditions and advanced age I think this is acceptable.    Medication Adjustments/Labs and Tests Ordered: Current medicines are reviewed at length with the patient today.  Concerns regarding medicines are outlined above.  Medication changes, Labs and Tests ordered today are  listed in the Patient Instructions below. Patient Instructions  Dr Sallyanne Kuster recommends that you continue on your current medications as directed. Please refer to the Current Medication list given to you today.  Remote monitoring is used to monitor your Pacemaker  of ICD from home. This monitoring reduces the number of office visits required to check your device to one time per year. It allows [place] to keep an eye on the functioning of your device to ensure it is working properly. You are scheduled for a device check from home on Thursday, May 23rd, 2019. You may send your transmission at any time that day. If you have a wireless device, the transmission will be sent automatically. After your physician reviews your transmission, you will receive a postcard with your next transmission date.  Dr Sallyanne Kuster recommends that you schedule a follow-up appointment in 6 months with a pacemaker check. You will receive a reminder letter in the mail two months in advance. If you don't receive a letter, please call our office to schedule the follow-up appointment.  If you need a refill on your cardiac medications before your next appointment, please call your pharmacy.    Signed, Sanda Klein, MD  03/11/2017 3:10 PM    [facility] [place], [place],   [address] Phone: [phone]; Fax: [phone]

## 2017-03-09 ENCOUNTER — Ambulatory Visit: Payer: Medicare HMO | Admitting: Endocrinology

## 2017-03-09 DIAGNOSIS — I259 Chronic ischemic heart disease, unspecified: Secondary | ICD-10-CM | POA: Diagnosis not present

## 2017-03-09 DIAGNOSIS — E1122 Type 2 diabetes mellitus with diabetic chronic kidney disease: Secondary | ICD-10-CM | POA: Diagnosis not present

## 2017-03-09 DIAGNOSIS — R69 Illness, unspecified: Secondary | ICD-10-CM | POA: Diagnosis not present

## 2017-03-09 DIAGNOSIS — I2510 Atherosclerotic heart disease of native coronary artery without angina pectoris: Secondary | ICD-10-CM | POA: Diagnosis not present

## 2017-03-09 DIAGNOSIS — M47812 Spondylosis without myelopathy or radiculopathy, cervical region: Secondary | ICD-10-CM | POA: Diagnosis not present

## 2017-03-09 DIAGNOSIS — R32 Unspecified urinary incontinence: Secondary | ICD-10-CM | POA: Diagnosis not present

## 2017-03-09 DIAGNOSIS — R269 Unspecified abnormalities of gait and mobility: Secondary | ICD-10-CM | POA: Diagnosis not present

## 2017-03-09 DIAGNOSIS — N19 Unspecified kidney failure: Secondary | ICD-10-CM | POA: Diagnosis not present

## 2017-03-09 DIAGNOSIS — G8929 Other chronic pain: Secondary | ICD-10-CM | POA: Diagnosis not present

## 2017-03-09 DIAGNOSIS — E109 Type 1 diabetes mellitus without complications: Secondary | ICD-10-CM | POA: Diagnosis not present

## 2017-03-09 DIAGNOSIS — K599 Functional intestinal disorder, unspecified: Secondary | ICD-10-CM | POA: Diagnosis not present

## 2017-03-09 DIAGNOSIS — I509 Heart failure, unspecified: Secondary | ICD-10-CM | POA: Diagnosis not present

## 2017-03-09 DIAGNOSIS — N189 Chronic kidney disease, unspecified: Secondary | ICD-10-CM | POA: Diagnosis not present

## 2017-03-09 DIAGNOSIS — J40 Bronchitis, not specified as acute or chronic: Secondary | ICD-10-CM | POA: Diagnosis not present

## 2017-03-09 DIAGNOSIS — J45909 Unspecified asthma, uncomplicated: Secondary | ICD-10-CM | POA: Diagnosis not present

## 2017-03-12 DIAGNOSIS — H04123 Dry eye syndrome of bilateral lacrimal glands: Secondary | ICD-10-CM | POA: Diagnosis not present

## 2017-03-12 DIAGNOSIS — Z961 Presence of intraocular lens: Secondary | ICD-10-CM | POA: Diagnosis not present

## 2017-03-12 DIAGNOSIS — H401133 Primary open-angle glaucoma, bilateral, severe stage: Secondary | ICD-10-CM | POA: Diagnosis not present

## 2017-03-15 DIAGNOSIS — R69 Illness, unspecified: Secondary | ICD-10-CM | POA: Diagnosis not present

## 2017-03-21 DIAGNOSIS — L304 Erythema intertrigo: Secondary | ICD-10-CM | POA: Diagnosis not present

## 2017-03-21 DIAGNOSIS — L821 Other seborrheic keratosis: Secondary | ICD-10-CM | POA: Diagnosis not present

## 2017-03-24 ENCOUNTER — Other Ambulatory Visit: Payer: Self-pay | Admitting: Cardiovascular Disease

## 2017-03-26 ENCOUNTER — Telehealth: Payer: Self-pay | Admitting: Endocrinology

## 2017-03-26 ENCOUNTER — Other Ambulatory Visit: Payer: Self-pay | Admitting: *Deleted

## 2017-03-26 ENCOUNTER — Other Ambulatory Visit: Payer: Self-pay

## 2017-03-26 ENCOUNTER — Other Ambulatory Visit: Payer: Self-pay | Admitting: Cardiovascular Disease

## 2017-03-26 MED ORDER — ROSUVASTATIN CALCIUM 20 MG PO TABS: 20.0000 mg | ORAL_TABLET | Freq: Every day | ORAL | 3 refills | Status: DC

## 2017-03-26 MED ORDER — POTASSIUM CHLORIDE ER 8 MEQ PO CPCR: 1.0000 | ORAL_CAPSULE | Freq: Every day | ORAL | 1 refills | Status: DC

## 2017-03-26 MED ORDER — ROSUVASTATIN CALCIUM 20 MG PO TABS: 20.0000 mg | ORAL_TABLET | Freq: Every day | ORAL | 1 refills | Status: DC

## 2017-03-26 MED ORDER — ROSUVASTATIN CALCIUM 20 MG PO TABS: 20.0000 mg | ORAL_TABLET | Freq: Every day | ORAL | 1 refills | Status: AC

## 2017-03-26 NOTE — Telephone Encounter (Signed)
Patient has been completely out of Rosuvastatin 20 mg for several days. Please send new RX for said medication to [facility] [address] asap (please change pharmacy permanently from [facility] pt record)

## 2017-03-26 NOTE — Telephone Encounter (Signed)
REFILL 

## 2017-03-26 NOTE — Telephone Encounter (Signed)
Done

## 2017-03-26 NOTE — Telephone Encounter (Signed)
°*  STAT* If patient is at the pharmacy, call can be transferred to refill team.   1. Which medications need to be refilled? (please list name of each medication and dose if known) Rosuvastatin and Potassium Chloride-new prescriptions-Changing pharmacy   2. Which pharmacy/location (including street and city if local pharmacy) is medication to be sent to?[facility] [phone] 3. Do they need a 30 day or 90 day supply? Whatever she usually get

## 2017-03-26 NOTE — Telephone Encounter (Signed)
°*  STAT* If patient is at the pharmacy, call can be transferred to refill team.   1. Which medications need to be refilled? (please list name of each medication and dose if known) Potassium Chloride CR 8 MEQ  2. Which pharmacy/location (including street and city if local pharmacy) is medication to be sent to? [id number] [place], [place] - [address]  3. Do they need a 30 day or 90 day supply?[place]

## 2017-03-28 LAB — CUP PACEART INCLINIC DEVICE CHECK
Battery Impedance: 128 Ohm
Battery Voltage: 2.79 V
Brady Statistic AP VS Percent: 0 %
Brady Statistic AS VP Percent: 95 %
Brady Statistic AS VS Percent: 1 %
Date Time Interrogation Session: 20190221210216
Implantable Lead Implant Date: 20080613
Implantable Lead Location: 753859
Implantable Lead Model: 4092
Implantable Lead Model: 5076
Lead Channel Impedance Value: 627 Ohm
Lead Channel Pacing Threshold Amplitude: 1 V
Lead Channel Pacing Threshold Pulse Width: 0.4 ms
Lead Channel Pacing Threshold Pulse Width: 0.4 ms
Lead Channel Setting Pacing Amplitude: 2.5 V
Lead Channel Setting Pacing Pulse Width: 0.4 ms
MDC IDC LEAD IMPLANT DT: 20080613
MDC IDC LEAD LOCATION: 753860
MDC IDC MSMT BATTERY REMAINING LONGEVITY: 120 mo
MDC IDC MSMT LEADCHNL RA IMPEDANCE VALUE: 402 Ohm
MDC IDC MSMT LEADCHNL RV PACING THRESHOLD AMPLITUDE: 0.75 V
MDC IDC PG IMPLANT DT: 20170606
MDC IDC SET LEADCHNL RA PACING AMPLITUDE: 2 V
MDC IDC SET LEADCHNL RV SENSING SENSITIVITY: 5.6 mV
MDC IDC STAT BRADY AP VP PERCENT: 4 %

## 2017-04-06 DIAGNOSIS — N19 Unspecified kidney failure: Secondary | ICD-10-CM | POA: Diagnosis not present

## 2017-04-06 DIAGNOSIS — J40 Bronchitis, not specified as acute or chronic: Secondary | ICD-10-CM | POA: Diagnosis not present

## 2017-04-06 DIAGNOSIS — E109 Type 1 diabetes mellitus without complications: Secondary | ICD-10-CM | POA: Diagnosis not present

## 2017-04-06 DIAGNOSIS — I509 Heart failure, unspecified: Secondary | ICD-10-CM | POA: Diagnosis not present

## 2017-04-06 DIAGNOSIS — R269 Unspecified abnormalities of gait and mobility: Secondary | ICD-10-CM | POA: Diagnosis not present

## 2017-04-06 DIAGNOSIS — I259 Chronic ischemic heart disease, unspecified: Secondary | ICD-10-CM | POA: Diagnosis not present

## 2017-04-09 ENCOUNTER — Other Ambulatory Visit: Payer: Self-pay

## 2017-04-09 MED ORDER — POTASSIUM CHLORIDE ER 8 MEQ PO CPCR: 1.0000 | ORAL_CAPSULE | Freq: Every day | ORAL | 3 refills | Status: DC

## 2017-04-12 ENCOUNTER — Ambulatory Visit: Payer: Medicare HMO | Admitting: Physician Assistant

## 2017-04-12 ENCOUNTER — Encounter: Payer: Self-pay | Admitting: Physician Assistant

## 2017-04-12 VITALS — BP 90/60 | HR 61 | Ht 64.0 in | Wt 169.0 lb

## 2017-04-12 DIAGNOSIS — I480 Paroxysmal atrial fibrillation: Secondary | ICD-10-CM

## 2017-04-12 DIAGNOSIS — I25118 Atherosclerotic heart disease of native coronary artery with other forms of angina pectoris: Secondary | ICD-10-CM | POA: Diagnosis not present

## 2017-04-12 DIAGNOSIS — H04123 Dry eye syndrome of bilateral lacrimal glands: Secondary | ICD-10-CM | POA: Diagnosis not present

## 2017-04-12 DIAGNOSIS — H401133 Primary open-angle glaucoma, bilateral, severe stage: Secondary | ICD-10-CM | POA: Diagnosis not present

## 2017-04-12 DIAGNOSIS — H10413 Chronic giant papillary conjunctivitis, bilateral: Secondary | ICD-10-CM | POA: Diagnosis not present

## 2017-04-12 DIAGNOSIS — I5042 Chronic combined systolic (congestive) and diastolic (congestive) heart failure: Secondary | ICD-10-CM | POA: Diagnosis not present

## 2017-04-12 DIAGNOSIS — Z79899 Other long term (current) drug therapy: Secondary | ICD-10-CM | POA: Diagnosis not present

## 2017-04-12 DIAGNOSIS — I9589 Other hypotension: Secondary | ICD-10-CM | POA: Diagnosis not present

## 2017-04-12 DIAGNOSIS — I951 Orthostatic hypotension: Secondary | ICD-10-CM | POA: Diagnosis not present

## 2017-04-12 DIAGNOSIS — Z961 Presence of intraocular lens: Secondary | ICD-10-CM | POA: Diagnosis not present

## 2017-04-12 LAB — BASIC METABOLIC PANEL
BUN/Creatinine Ratio: 7 — ABNORMAL LOW (ref 12–28)
BUN: 13 mg/dL (ref 10–36)
CALCIUM: 9.1 mg/dL (ref 8.7–10.3)
CO2: 30 mmol/L — AB (ref 20–29)
Chloride: 101 mmol/L (ref 96–106)
Creatinine, Ser: 1.79 mg/dL — ABNORMAL HIGH (ref 0.57–1.00)
GFR calc Af Amer: 28 mL/min/{1.73_m2} — ABNORMAL LOW (ref >59)
GFR, EST NON AFRICAN AMERICAN: 24 mL/min/{1.73_m2} — AB (ref >59)
Glucose: 152 mg/dL — ABNORMAL HIGH (ref 65–99)
POTASSIUM: 3.5 mmol/L (ref 3.5–5.2)
Sodium: 146 mmol/L — ABNORMAL HIGH (ref 134–144)

## 2017-04-12 MED ORDER — FUROSEMIDE 20 MG PO TABS: ORAL_TABLET | ORAL | 3 refills | Status: DC

## 2017-04-12 MED ORDER — POTASSIUM CHLORIDE ER 8 MEQ PO CPCR: ORAL_CAPSULE | ORAL | 3 refills | Status: DC

## 2017-04-12 NOTE — Patient Instructions (Signed)
Medication Instructions:  Your physician has recommended you make the following change in your medication:  1) TAKE your Lasix on Mondays and Thursday starting on Monday April 1st.  2)Take your potassium ONLY on the days you take your Lasix  Labwork: Your physician recommends that you return for lab work in: TODAY (bmet)   Testing/Procedures: none  Follow-Up: Your physician recommends that you schedule a follow-up appointment in: August 2019 with Dr. Sallyanne Kuster   Any Other Special Instructions Will Be Listed Below (If Applicable).     If you need a refill on your cardiac medications before your next appointment, please call your pharmacy.

## 2017-04-12 NOTE — Progress Notes (Signed)
Cardiology Office Note   Date:  04/12/2017   ID:  [name], DOB [birth year], MRN [id number]  PCP:  [name], MD  Cardiologist: Dr. Sallyanne Kuster, 03/08/2017 Rosaria Ferries, PA-C 01/02/2017  No chief complaint on file.   History of Present Illness: [name] is a 84 y.o. female with a history of CABG 1994, S-D-CHF, PAF on coumadin, DM, HLD, CKD III, CHB s/p MDT PPM w/ gen change 2017, retinopathy and nephropathy, hypotention, abnl MV 2015 and NSTEMI 06/2015 w/ med rx (no cath).  CHADSVasc 48 (age 51, CVA 2, CHF, CAD, previous HTN, DM), no ASA due to warfarin  03/08/2017 office visit, dry weight 175-178 pounds, no change in CHF meds due to hypotension and CKD, rare PAT on device interrogation  [name] presents for cardiology follow up.   She feels her breathing is ok. She gets DOE with moving herself, requires 1 person assist all the time, sometimes more. Does not move around much. No LE edema, no orthopnea or PND.   Gets light-headed at times, orthostatic in nature. She has to sit on the side of the bed to get it together before she gets up. She has not fallen or passed out.   She gets chest pain at times. Not exertional, family treats it with pain meds and it resolves. She gets it regularly, but cannot remember when the last episode was. She cannot rate it or tell more about it.  She never has palpitations.   The family is able to weigh her in her chair, the weights have been trending down. She eats twice a day, but does not eat much in the evening, her main meal is brunch.   Past Medical History:  Diagnosis Date  . 2nd degree atrioventricular block    a. 06/22/15 Gen change: MDT ADDR01 Adapta, DC PPM (ser# [id number]).  . Allergy to perfume   . Anemia   . Arthritis   . Chronic combined systolic and diastolic CHF (congestive heart failure) ([place])    a. 01/2014 Echo: EF @ least mod-sev reduced with HK of lat/apical, basal inf walls. basalpost AK, Gr  1DD; b. 06/2015 Echo: EF 45-50%, Gr2 DD, mod LVH.   . CKD (chronic kidney disease), stage III ([place])    a. iii - iv.  . Coronary artery disease    a. 1994 s/p cabg;  b. 09/2013 MV: large, sev intensity, partially reversible inf, apical defect, prior inf/ap infarct w/ mild peri-infarct ischemia->Med Rx; c. 06/2015 NSTEMI (trop 6)->Med rx.  . Daily headache   . DVT of upper extremity (deep vein thrombosis) ([place]) 06/13/2012   BUE  . Dyspepsia   . Gastric ulcer   . GERD (gastroesophageal reflux disease)   . H/O: GI bleed 12//13  . Hiatal hernia   . High cholesterol   . History of blood transfusion 2013  . Hypertensive heart disease   . Hypothyroid   . Legally blind   . PAF (paroxysmal atrial fibrillation) ([facility])    a. CHA2DS2VASc = 7-->coumadin.  . Presence of permanent cardiac pacemaker    a. 06/22/15 Gen change: MDT ADDR01 Adapta, DC PPM (ser# [id number]).  . Seasonal allergies   . Type II diabetes mellitus ([place])   . UTI (lower urinary tract infection)     Past Surgical History:  Procedure Laterality Date  . CARDIAC CATHETERIZATION  10/25/92  . CARDIAC CATHETERIZATION  11/18/03   w/grafts 100%CX LAD 80 & 100%  . CARDIAC CATHETERIZATION  01/24/05  diffuse disease of native vessels  . CARDIAC CATHETERIZATION  06/06/06   severe native CAD  . CARDIOVERSION  06/06/06   successful  . CATARACT EXTRACTION W/ INTRAOCULAR LENS  IMPLANT, BILATERAL Bilateral   . CORONARY ARTERY BYPASS GRAFT  10/27/92   LIMA to LAD,SVG to LAD second diagonal,obtuse maraginal of the CX and posterior descendingbranch of the RCA  . EP IMPLANTABLE DEVICE N/A 06/22/2015   Procedure: PPM/BIV PPM Generator Changeout;  Surgeon: Sanda Klein, MD;  Location: [facility];  Service: Cardiovascular;  Laterality: N/A;  . ESOPHAGOGASTRODUODENOSCOPY  12/22/2011   Procedure: ESOPHAGOGASTRODUODENOSCOPY (EGD);  Surgeon: Gatha Mayer, MD;  Location: [facility];  Service: Endoscopy;  Laterality: N/A;  . INSERT / REPLACE /  REMOVE PACEMAKER  06/29/2006   Medtronic adapta  . REFRACTIVE SURGERY Bilateral     Current Outpatient Medications  Medication Sig Dispense Refill  . ACCU-CHEK FASTCLIX LANCETS MISC Use to check sugar 2 times daily 200 each 11  . acetaminophen (TYLENOL) 500 MG tablet Take 500 mg by mouth every 6 (six) hours as needed (pain).    Marland Kitchen acetaminophen-codeine (TYLENOL #3) 300-30 MG tablet Take 1 tablet by mouth every 6 (six) hours as needed for severe pain.  0  . albuterol (PROAIR HFA) 108 (90 Base) MCG/ACT inhaler Inhale 2 puffs into the lungs every 6 (six) hours as needed for wheezing or shortness of breath.    . AMITIZA 24 MCG capsule Take 1 capsule (24 mcg total) by mouth 2 (two) times daily with a meal. 60 capsule 3  . ARGININE PO Take 1 capsule by mouth daily.    Marland Kitchen atropine 1 % ophthalmic solution Place 1 drop into both eyes daily as needed (burning eyes).   0  . Blood Glucose Monitoring Suppl (ACCU-CHEK AVIVA PLUS) w/Device KIT Use to check sugars 2 times daily. 1 kit 0  . docusate sodium (COLACE) 100 MG capsule Take 100 mg daily by mouth.    . donepezil (ARICEPT) 5 MG tablet Take 5 mg by mouth at bedtime.   0  . folic acid (FOLVITE) 1 MG tablet TAKE 1 TABLET BY MOUTH EVERY DAY 30 tablet 5  . furosemide (LASIX) 20 MG tablet Take 1 tablet by mouth daily 135 tablet 3  . gabapentin (NEURONTIN) 100 MG capsule Take 100 mg by mouth 2 (two) times daily.     Marland Kitchen glipiZIDE (GLUCOTROL) 5 MG tablet TAKE 1/2 PILL DAILY 45 tablet 1  . glucose blood test strip Use to test blood sugar twice daily 100 each 4  . guaifenesin (MUCUS RELIEF CHEST CONGESTION) 400 MG TABS tablet Take 400 mg by mouth 2 (two) times daily as needed (cough/ congestion).    Marland Kitchen ketoconazole (NIZORAL) 2 % cream 1 APPLICATION APPLY ON THE SKIN DAILY APPLY DAILY TO AFFECTED AREAS FOR MAINTENANCE  3  . levalbuterol (XOPENEX) 0.63 MG/3ML nebulizer solution Take 3 mLs by nebulization 3 (three) times daily as needed for wheezing or shortness of  breath.   0  . levothyroxine (SYNTHROID) 125 MCG tablet Take 0.5 tablets (62.5 mcg total) by mouth daily. 45 tablet 2  . loratadine (CLARITIN) 10 MG tablet Take 10 mg by mouth daily.    . magnesium citrate SOLN Take 1 Bottle by mouth every 7 (seven) days.    . meclizine (ANTIVERT) 25 MG tablet Take 25 mg by mouth 3 (three) times daily as needed for dizziness.    Marland Kitchen ofloxacin (OCUFLOX) 0.3 % ophthalmic solution Place 1 drop into both  eyes 4 (four) times daily as needed (for drainage).   0  . omeprazole (PRILOSEC) 40 MG capsule Take 1 capsule (40 mg total) by mouth 2 (two) times daily. 60 capsule 3  . OVER THE COUNTER MEDICATION Take 1 Can by mouth daily. Enterex nutritional supplement - with choline    . OVER THE COUNTER MEDICATION Kidney supplement capsules (Choline 100 mg, trimethylglycine 50 mg, glutathione 50 mg, methyltetrahydrofolate, l-arginine-, l-ornithine, l-citruline): Take 1 capsule by mouth two to three times a week    . Polyvinyl Alcohol-Povidone (REFRESH OP) Place 1 drop into both eyes daily as needed (dry eyes).     . Potassium Chloride CR (MICRO-K) 8 MEQ CPCR capsule CR Take 1 capsule on Mondays and Thursdays, the days you take your Lasix. 90 capsule 3  . rosuvastatin (CRESTOR) 20 MG tablet Take 1 tablet (20 mg total) by mouth daily. 90 tablet 1  . traMADol (ULTRAM) 50 MG tablet Take 1 tablet (50 mg total) by mouth 2 (two) times daily as needed for moderate pain.    Marland Kitchen triamcinolone cream (KENALOG) 0.1 % 1 APPLICATION APPLY ON THE SKIN TWICE A DAY APPLY TO AFFECTED AREAS TWICE DAILY AS NEEDED FOR FLARE  1  . warfarin (COUMADIN) 3 MG tablet TAKE 1 TABLET TO 1 AND 1/2 TABLETS EVERY DAY AS DIRECTED BY COUMADIN CLINIC. 140 tablet 1  . zolpidem (AMBIEN) 10 MG tablet Take 5-10 mg by mouth at bedtime as needed for sleep.   0   No current facility-administered medications for this visit.     Allergies:   Macrobid [nitrofurantoin]; Darvon; Digoxin and related; Penicillins; Percocet  [oxycodone-acetaminophen]; Percodan [oxycodone-aspirin]; and Vicodin [hydrocodone-acetaminophen]    Social History:  The patient  reports that she has never smoked. She has never used smokeless tobacco. She reports that she does not drink alcohol or use drugs.   Family History:  The patient's family history includes Breast cancer in her daughter; Colon polyps in her daughter; Diabetes in her brother, daughter, father, and son; Heart attack in her brother; Heart disease in her father; Stroke in her sister.    ROS:  Please see the history of present illness. All other systems are reviewed and negative.    PHYSICAL EXAM: VS:  BP 90/60 (BP Location: Right Arm, Patient Position: Sitting, Cuff Size: Normal)   Pulse 61   Ht '5\' 4"'$  (1.626 m)   Wt 169 lb (76.7 kg) Comment: stated by pt caregiver  SpO2 91%   BMI 29.01 kg/m  , BMI Body mass index is 29.01 kg/m. GEN: Well nourished, well developed, female in no acute distress  HEENT: normal for age  Neck: minimal JVD, no carotid bruit, no masses Cardiac: RRR; no murmur, no rubs, or gallops Respiratory:  clear to auscultation bilaterally, normal work of breathing GI: soft, nontender, nondistended, + BS MS: no deformity or atrophy; no edema; distal pulses are 2+ in all 4 extremities   Skin: warm and dry, no rash Neuro:  Strength and sensation are intact Psych: euthymic mood, full affect   EKG:  EKG is not ordered today.  Echo: 06/25/2015 - Left ventricle: The cavity size was normal. Wall thickness was   increased in a pattern of moderate LVH. Systolic function was   mildly reduced. The estimated ejection fraction was in the range   of 45% to 50%. Features are consistent with a pseudonormal left   ventricular filling pattern, with concomitant abnormal relaxation   and increased filling pressure (grade 2 diastolic dysfunction). -  Aortic valve: Mildly calcified annulus. Mildly thickened, mildly   calcified leaflets.  Recent Labs: 06/19/2016:  Magnesium 2.6 09/08/2016: B Natriuretic Peptide 306.1 09/11/2016: Hemoglobin 10.7; Platelets 200 02/09/2017: ALT 9; BUN 13; Creatinine, Ser 1.17; Potassium 3.8; Sodium 137; TSH 5.74    Lipid Panel    Component Value Date/Time   CHOL 152 02/09/2017 1125   TRIG 262.0 (H) 02/09/2017 1125   HDL 31.70 (L) 02/09/2017 1125   CHOLHDL 5 02/09/2017 1125   VLDL 52.4 (H) 02/09/2017 1125   LDLCALC 141 (H) 10/25/2015 1118   LDLDIRECT 73.0 02/09/2017 1125     Wt Readings from Last 3 Encounters:  04/12/17 169 lb (76.7 kg)  01/25/17 181 lb 3.2 oz (82.2 kg)  11/28/16 171 lb (77.6 kg)     Other studies Reviewed: Additional studies/ records that were reviewed today include: office notes, hospital records and testing.  ASSESSMENT AND PLAN:  1.  Chronic combined systolic and diastolic CHF: Her weight is down and her volume status is good by exam.  She is currently taking Lasix 20 mg a day.  I will cut that back to Lasix 20 mg twice a week as her p.o. intake is poor.  She has been out of her potassium for a week.  We will check a BMET today.  Her daughter has ordered the K+ and pt will take it on the Lasix days.   2. Hypotension, hx HTN: SBP is in the 90s. However, the only BP- lowering rx is the Lasix and I do not think that they will be successful in dosing it based on her weight.  See if intermittent doses manage the fluid and allow her blood pressure to float a little higher.  3.  CAD: Her chest pain is not consistent with exertion.  She is not on aspirin because of the Coumadin.  She is not on a beta-blocker or ACE inhibitor because of blood pressure issues.  Continue Crestor  4.  PAF, Medtronic pacemaker in place: Her device was interrogated during her last office visit in February.  She has a remote check scheduled in May.  I gave the daughter the date and they will call in and get instructions on how to do this as she is not sure.   Current medicines are reviewed at length with the patient today.   The patient does not have concerns regarding medicines.  The following changes have been made: Decrease Lasix and potassium  Labs/ tests ordered today include:   Orders Placed This Encounter  Procedures  . Basic metabolic panel     Disposition:   FU with Dr. Sallyanne Kuster  Signed, Rosaria Ferries, PA-C  04/12/2017 1:44 PM    [place] Phone: [phone]; Fax: [phone]  This note was written with the assistance of speech recognition software. Please excuse any transcriptional errors.

## 2017-04-13 DIAGNOSIS — R69 Illness, unspecified: Secondary | ICD-10-CM | POA: Diagnosis not present

## 2017-04-19 ENCOUNTER — Other Ambulatory Visit: Payer: Self-pay

## 2017-04-19 MED ORDER — POTASSIUM CHLORIDE ER 8 MEQ PO CPCR: ORAL_CAPSULE | ORAL | 3 refills | Status: DC

## 2017-04-26 DIAGNOSIS — N3020 Other chronic cystitis without hematuria: Secondary | ICD-10-CM | POA: Diagnosis not present

## 2017-04-26 DIAGNOSIS — R300 Dysuria: Secondary | ICD-10-CM | POA: Diagnosis not present

## 2017-04-27 ENCOUNTER — Telehealth: Payer: Self-pay | Admitting: Gastroenterology

## 2017-04-27 ENCOUNTER — Ambulatory Visit: Payer: Medicare HMO | Admitting: Gastroenterology

## 2017-04-27 NOTE — Telephone Encounter (Signed)
No charge. 

## 2017-05-07 DIAGNOSIS — R269 Unspecified abnormalities of gait and mobility: Secondary | ICD-10-CM | POA: Diagnosis not present

## 2017-05-07 DIAGNOSIS — I509 Heart failure, unspecified: Secondary | ICD-10-CM | POA: Diagnosis not present

## 2017-05-07 DIAGNOSIS — E109 Type 1 diabetes mellitus without complications: Secondary | ICD-10-CM | POA: Diagnosis not present

## 2017-05-07 DIAGNOSIS — J40 Bronchitis, not specified as acute or chronic: Secondary | ICD-10-CM | POA: Diagnosis not present

## 2017-05-07 DIAGNOSIS — N19 Unspecified kidney failure: Secondary | ICD-10-CM | POA: Diagnosis not present

## 2017-05-07 DIAGNOSIS — I259 Chronic ischemic heart disease, unspecified: Secondary | ICD-10-CM | POA: Diagnosis not present

## 2017-05-10 ENCOUNTER — Ambulatory Visit: Payer: Medicare HMO | Admitting: Endocrinology

## 2017-05-10 ENCOUNTER — Encounter: Payer: Self-pay | Admitting: Endocrinology

## 2017-05-10 VITALS — BP 122/78 | HR 80 | Ht 64.0 in

## 2017-05-10 DIAGNOSIS — E063 Autoimmune thyroiditis: Secondary | ICD-10-CM | POA: Diagnosis not present

## 2017-05-10 DIAGNOSIS — N184 Chronic kidney disease, stage 4 (severe): Secondary | ICD-10-CM

## 2017-05-10 DIAGNOSIS — E782 Mixed hyperlipidemia: Secondary | ICD-10-CM | POA: Diagnosis not present

## 2017-05-10 DIAGNOSIS — E1165 Type 2 diabetes mellitus with hyperglycemia: Secondary | ICD-10-CM

## 2017-05-10 LAB — CBC WITH DIFFERENTIAL/PLATELET
Basophils Absolute: 0 10*3/uL (ref 0.0–0.1)
Basophils Relative: 0.8 % (ref 0.0–3.0)
EOS ABS: 0.1 10*3/uL (ref 0.0–0.7)
Eosinophils Relative: 1.5 % (ref 0.0–5.0)
HCT: 34.4 % — ABNORMAL LOW (ref 36.0–46.0)
HEMOGLOBIN: 11.2 g/dL — AB (ref 12.0–15.0)
Lymphocytes Relative: 34.1 % (ref 12.0–46.0)
Lymphs Abs: 1.7 10*3/uL (ref 0.7–4.0)
MCHC: 32.5 g/dL (ref 30.0–36.0)
MCV: 88 fl (ref 78.0–100.0)
MONO ABS: 0.2 10*3/uL (ref 0.1–1.0)
Monocytes Relative: 4.7 % (ref 3.0–12.0)
Neutro Abs: 2.9 10*3/uL (ref 1.4–7.7)
Neutrophils Relative %: 58.9 % (ref 43.0–77.0)
Platelets: 176 10*3/uL (ref 150.0–400.0)
RBC: 3.91 Mil/uL (ref 3.87–5.11)
RDW: 15 % (ref 11.5–15.5)
WBC: 5 10*3/uL (ref 4.0–10.5)

## 2017-05-10 LAB — LIPID PANEL
CHOLESTEROL: 154 mg/dL (ref 0–200)
HDL: 31.3 mg/dL — AB (ref >39.00)
NonHDL: 122.8
Total CHOL/HDL Ratio: 5
Triglycerides: 214 mg/dL — ABNORMAL HIGH (ref 0.0–149.0)
VLDL: 42.8 mg/dL — AB (ref 0.0–40.0)

## 2017-05-10 LAB — COMPREHENSIVE METABOLIC PANEL
ALK PHOS: 101 U/L (ref 39–117)
ALT: 5 U/L (ref 0–35)
AST: 13 U/L (ref 0–37)
Albumin: 2.8 g/dL — ABNORMAL LOW (ref 3.5–5.2)
BUN: 8 mg/dL (ref 6–23)
CALCIUM: 8.4 mg/dL (ref 8.4–10.5)
CHLORIDE: 107 meq/L (ref 96–112)
CO2: 29 mEq/L (ref 19–32)
CREATININE: 1.23 mg/dL — AB (ref 0.40–1.20)
GFR: 52.19 mL/min — ABNORMAL LOW (ref >60.00)
Glucose, Bld: 196 mg/dL — ABNORMAL HIGH (ref 70–99)
POTASSIUM: 3.8 meq/L (ref 3.5–5.1)
Sodium: 140 mEq/L (ref 135–145)
Total Bilirubin: 0.4 mg/dL (ref 0.2–1.2)
Total Protein: 6.1 g/dL (ref 6.0–8.3)

## 2017-05-10 LAB — POCT GLYCOSYLATED HEMOGLOBIN (HGB A1C): HEMOGLOBIN A1C: 6.5

## 2017-05-10 LAB — LDL CHOLESTEROL, DIRECT: Direct LDL: 91 mg/dL

## 2017-05-10 LAB — TSH: TSH: 1.36 u[IU]/mL (ref 0.35–4.50)

## 2017-05-10 NOTE — Progress Notes (Signed)
Patient ID: [name], female   DOB: [birth year], 84 y.o.   MRN: [id number]   Reason for Appointment:  follow-up   History of Present Illness   Diagnosis: Type 2 diabetes mellitus, date of diagnosis: 1972.   She had been on insulin for several years and this had been tapered off in 2013 because of weight loss and lower blood sugars. Subsequently blood sugars had been generally reasonably good.  Because of her age and multiple medical problems she has been monitored without insulin  Recent history:  Her A1c is improved at 6.5, previously 7.7   Oral hypoglycemic drugs use: Glipizide 5 mg, half tablet at suppertime  Current management, blood sugar patterns and problems:  She did  bring her monitor for download but this could not be downloaded  Her daughter is checking her blood sugar primarily before supper before giving her glipizide  Not clear if her blood sugars are always accurate as she sometimes them twice in the 2 readings may be discordant  However most of her blood sugars are near normal with an average of 125 for the last month  With taking 2.5 mg glipizide she has not had any unusually high or low sugars  Her daughter says that she is generally eating a meal in the evening although may not eat the same portions consistently but usually has a good breakfast  However she has not checked many readings after supper  She has couple of leaving late at night and highest is 182  She was told she can have small amount of regular soft drink such as lemonade  Also sometimes will have Glucerna if she does not eat  Her weight is stable at home but unable to weigh her today  MONITOR: [employer]  Average 125, recent range 73-182  Meals: 2 meals at about noon and 7 pm; sometimes has lemonade or sprite. Has small portions, has some grapes for snacks in the afternoon  Dietician visit: Most recent:2001.     Wt Readings from Last 3 Encounters:  04/12/17  169 lb (76.7 kg)  01/25/17 181 lb 3.2 oz (82.2 kg)  11/28/16 171 lb (77.6 kg)   Lab Results  Component Value Date   HGBA1C 6.5 05/10/2017   HGBA1C 7.7 02/09/2017   HGBA1C 7.8 10/13/2016   Lab Results  Component Value Date   LDLCALC 141 (H) 10/25/2015   CREATININE 1.23 (H) 05/10/2017   Lab Results  Component Value Date   FRUCTOSAMINE 371 (H) 07/07/2015   FRUCTOSAMINE 274 (H) 03/24/2013     Allergies as of 05/10/2017      Reactions   Macrobid [nitrofurantoin] Rash, Cough   Wheezing (also) THIS REACTION RESULTED IN THE PATIENT ENDING UP IN THE E.D.   Darvon Other (See Comments)   Causes confusion   Digoxin And Related Nausea Only   Penicillins Other (See Comments)   From childhood: Has patient had a PCN reaction causing immediate rash, facial/tongue/throat swelling, SOB or lightheadedness with hypotension: Unknown Has patient had a PCN reaction causing severe rash involving mucus membranes or skin necrosis: Unknown Has patient had a PCN reaction that required hospitalization: Unknown Has patient had a PCN reaction occurring within the last 10 years: No If all of the above answers are "NO", then may proceed with Cephalosporin use.   Percocet [oxycodone-acetaminophen] Other (See Comments)   Causes confusion   Percodan [oxycodone-aspirin] Other (See Comments)   Causes confusion   Vicodin [hydrocodone-acetaminophen] Other (See Comments)  Causes confusion      Medication List        Accurate as of 05/10/17 11:59 PM. Always use your most recent med list.          ACCU-CHEK AVIVA PLUS w/Device Kit Use to check sugars 2 times daily.   ACCU-CHEK FASTCLIX LANCETS Misc Use to check sugar 2 times daily   acetaminophen 500 MG tablet Commonly known as:  TYLENOL Take 500 mg by mouth every 6 (six) hours as needed (pain).   acetaminophen-codeine 300-30 MG tablet Commonly known as:  TYLENOL #3 Take 1 tablet by mouth every 6 (six) hours as needed for severe pain.   AMITIZA  24 MCG capsule Generic drug:  lubiprostone Take 1 capsule (24 mcg total) by mouth 2 (two) times daily with a meal.   ARGININE PO Take 1 capsule by mouth daily.   atropine 1 % ophthalmic solution Place 1 drop into both eyes daily as needed (burning eyes).   docusate sodium 100 MG capsule Commonly known as:  COLACE Take 100 mg daily by mouth.   donepezil 5 MG tablet Commonly known as:  ARICEPT Take 5 mg by mouth at bedtime.   folic acid 1 MG tablet Commonly known as:  FOLVITE TAKE 1 TABLET BY MOUTH EVERY DAY   furosemide 20 MG tablet Commonly known as:  LASIX Take 1 tablet by mouth on Mondays and Thursdays   gabapentin 100 MG capsule Commonly known as:  NEURONTIN Take 100 mg by mouth 2 (two) times daily.   glipiZIDE 5 MG tablet Commonly known as:  GLUCOTROL TAKE 1/2 PILL DAILY   glucose blood test strip Use to test blood sugar twice daily   ketoconazole 2 % cream Commonly known as:  NIZORAL 1 APPLICATION APPLY ON THE SKIN DAILY APPLY DAILY TO AFFECTED AREAS FOR MAINTENANCE   levalbuterol 0.63 MG/3ML nebulizer solution Commonly known as:  XOPENEX Take 3 mLs by nebulization 3 (three) times daily as needed for wheezing or shortness of breath.   levothyroxine 125 MCG tablet Commonly known as:  SYNTHROID Take 0.5 tablets (62.5 mcg total) by mouth daily.   loratadine 10 MG tablet Commonly known as:  CLARITIN Take 10 mg by mouth daily.   magnesium citrate Soln Take 1 Bottle by mouth every 7 (seven) days.   meclizine 25 MG tablet Commonly known as:  ANTIVERT Take 25 mg by mouth 3 (three) times daily as needed for dizziness.   MUCUS RELIEF CHEST CONGESTION 400 MG Tabs tablet Generic drug:  guaifenesin Take 400 mg by mouth 2 (two) times daily as needed (cough/ congestion).   ofloxacin 0.3 % ophthalmic solution Commonly known as:  OCUFLOX Place 1 drop into both eyes 4 (four) times daily as needed (for drainage).   omeprazole 40 MG capsule Commonly known as:   PRILOSEC Take 1 capsule (40 mg total) by mouth 2 (two) times daily.   OVER THE COUNTER MEDICATION Take 1 Can by mouth daily. Enterex nutritional supplement - with choline   OVER THE COUNTER MEDICATION Kidney supplement capsules (Choline 100 mg, trimethylglycine 50 mg, glutathione 50 mg, methyltetrahydrofolate, l-arginine-, l-ornithine, l-citruline): Take 1 capsule by mouth two to three times a week   Potassium Chloride CR 8 MEQ Cpcr capsule CR Commonly known as:  MICRO-K Take 1 capsule on Mondays and Thursdays, the days you take your Lasix.   PROAIR HFA 108 (90 Base) MCG/ACT inhaler Generic drug:  albuterol Inhale 2 puffs into the lungs every 6 (six) hours as needed for  wheezing or shortness of breath.   REFRESH OP Place 1 drop into both eyes daily as needed (dry eyes).   rosuvastatin 20 MG tablet Commonly known as:  CRESTOR Take 1 tablet (20 mg total) by mouth daily.   traMADol 50 MG tablet Commonly known as:  ULTRAM Take 1 tablet (50 mg total) by mouth 2 (two) times daily as needed for moderate pain.   triamcinolone cream 0.1 % Commonly known as:  KENALOG 1 APPLICATION APPLY ON THE SKIN TWICE A DAY APPLY TO AFFECTED AREAS TWICE DAILY AS NEEDED FOR FLARE   warfarin 3 MG tablet Commonly known as:  COUMADIN Take as directed by the anticoagulation clinic. If you are unsure how to take this medication, talk to your nurse or doctor. Original instructions:  TAKE 1 TABLET TO 1 AND 1/2 TABLETS EVERY DAY AS DIRECTED BY COUMADIN CLINIC.   zolpidem 10 MG tablet Commonly known as:  AMBIEN Take 5-10 mg by mouth at bedtime as needed for sleep.       Past Medical History:  Diagnosis Date  . 2nd degree atrioventricular block    a. 06/22/15 Gen change: MDT ADDR01 Adapta, DC PPM (ser# [id number]).  . Allergy to perfume   . Anemia   . Arthritis   . Chronic combined systolic and diastolic CHF (congestive heart failure) ([place])    a. 01/2014 Echo: EF @ least mod-sev reduced with HK of  lat/apical, basal inf walls. basalpost AK, Gr 1DD; b. 06/2015 Echo: EF 45-50%, Gr2 DD, mod LVH.   . CKD (chronic kidney disease), stage III ([place])    a. iii - iv.  . Coronary artery disease    a. 1994 s/p cabg;  b. 09/2013 MV: large, sev intensity, partially reversible inf, apical defect, prior inf/ap infarct w/ mild peri-infarct ischemia->Med Rx; c. 06/2015 NSTEMI (trop 6)->Med rx.  . Daily headache   . DVT of upper extremity (deep vein thrombosis) ([place]) 06/13/2012   BUE  . Dyspepsia   . Gastric ulcer   . GERD (gastroesophageal reflux disease)   . H/O: GI bleed 12//13  . Hiatal hernia   . High cholesterol   . History of blood transfusion 2013  . Hypertensive heart disease   . Hypothyroid   . Legally blind   . PAF (paroxysmal atrial fibrillation) ([facility])    a. CHA2DS2VASc = 7-->coumadin.  . Presence of permanent cardiac pacemaker    a. 06/22/15 Gen change: MDT ADDR01 Adapta, DC PPM (ser# [id number]).  . Seasonal allergies   . Type II diabetes mellitus ([place])   . UTI (lower urinary tract infection)     Past Surgical History:  Procedure Laterality Date  . CARDIAC CATHETERIZATION  10/25/92  . CARDIAC CATHETERIZATION  11/18/03   w/grafts 100%CX LAD 80 & 100%  . CARDIAC CATHETERIZATION  01/24/05   diffuse disease of native vessels  . CARDIAC CATHETERIZATION  06/06/06   severe native CAD  . CARDIOVERSION  06/06/06   successful  . CATARACT EXTRACTION W/ INTRAOCULAR LENS  IMPLANT, BILATERAL Bilateral   . CORONARY ARTERY BYPASS GRAFT  10/27/92   LIMA to LAD,SVG to LAD second diagonal,obtuse maraginal of the CX and posterior descendingbranch of the RCA  . EP IMPLANTABLE DEVICE N/A 06/22/2015   Procedure: PPM/BIV PPM Generator Changeout;  Surgeon: Sanda Klein, MD;  Location: [facility];  Service: Cardiovascular;  Laterality: N/A;  . ESOPHAGOGASTRODUODENOSCOPY  12/22/2011   Procedure: ESOPHAGOGASTRODUODENOSCOPY (EGD);  Surgeon: Gatha Mayer, MD;  Location: [facility];  Service:  Endoscopy;  Laterality: N/A;  . INSERT / REPLACE / REMOVE PACEMAKER  06/29/2006   Medtronic adapta  . REFRACTIVE SURGERY Bilateral     Family History  Problem Relation Age of Onset  . Heart disease Father   . Diabetes Father   . Breast cancer Daughter   . Diabetes Son   . Diabetes Daughter   . Colon polyps Daughter   . Heart attack Brother   . Diabetes Brother   . Stroke Sister   . Colon cancer Neg Hx     Social History:  reports that she has never smoked. She has never used smokeless tobacco. She reports that she does not drink alcohol or use drugs.  Allergies:  Allergies  Allergen Reactions  . Macrobid [Nitrofurantoin] Rash and Cough    Wheezing (also) THIS REACTION RESULTED IN THE PATIENT ENDING UP IN THE E.D.  . Darvon Other (See Comments)    Causes confusion  . Digoxin And Related Nausea Only  . Penicillins Other (See Comments)    From childhood: Has patient had a PCN reaction causing immediate rash, facial/tongue/throat swelling, SOB or lightheadedness with hypotension: Unknown Has patient had a PCN reaction causing severe rash involving mucus membranes or skin necrosis: Unknown Has patient had a PCN reaction that required hospitalization: Unknown Has patient had a PCN reaction occurring within the last 10 years: No If all of the above answers are "NO", then may proceed with Cephalosporin use.   Marland Kitchen Percocet [Oxycodone-Acetaminophen] Other (See Comments)    Causes confusion  . Percodan [Oxycodone-Aspirin] Other (See Comments)    Causes confusion  . Vicodin [Hydrocodone-Acetaminophen] Other (See Comments)    Causes confusion    REVIEW of systems:  Hypothyroidism: Is mild She is taking levothyroxine  62.5 mcg daily  TSH has been variable at times  Lab Results  Component Value Date   TSH 1.36 05/10/2017   TSH 5.74 (H) 02/09/2017   TSH 1.560 09/08/2016   FREET4 0.70 06/25/2014   FREET4 0.65 06/24/2013   FREET4 0.93 01/24/2013     She has had renal  dysfunction with variable results over the last few months  Lab Results  Component Value Date   CREATININE 1.23 (H) 05/10/2017   CREATININE 1.79 (H) 04/12/2017   CREATININE 1.17 02/09/2017     She has significant neuropathy with sensory loss on previous  foot exam.   She has had hyperlipidemia  followed by PCP    Lab Results  Component Value Date   CHOL 154 05/10/2017   HDL 31.30 (L) 05/10/2017   LDLCALC 141 (H) 10/25/2015   LDLDIRECT 91.0 05/10/2017   TRIG 214.0 (H) 05/10/2017   CHOLHDL 5 05/10/2017      Examination:   BP 122/78 (BP Location: Left Arm, Patient Position: Sitting, Cuff Size: Normal)   Pulse 80   Ht _0  (1.626 m)   SpO2 97%   BMI 29.01 kg/m   Body mass index is 29.01 kg/m.    Assesment/plan:   1.  Diabetes type 2, with obesity, long-standing   See history of present illness for  discussion of current diabetes management, blood sugar patterns and problems identified  Her A1c is significantly better with only taking 2.5 glipizide at suppertime Blood sugars at home are fairly good although not doing readings at a time or in the morning no hypoglycemia with this Her appetite is stable she is eating very well meals in the evening  For now since she is doing well on this regimen  she will continue Reminded her daughter to check blood sugars after supper also  2.  Hypothyroidism: On low-dose supplementation, to have TSH checked today   Elayne Snare 05/11/2017, 8:07 AM   Addendum: TSH normal     .

## 2017-05-10 NOTE — Patient Instructions (Signed)
No glipizide if not hungry ar dinner

## 2017-05-15 DIAGNOSIS — R69 Illness, unspecified: Secondary | ICD-10-CM | POA: Diagnosis not present

## 2017-05-16 ENCOUNTER — Telehealth: Payer: Self-pay | Admitting: Physician Assistant

## 2017-05-16 NOTE — Telephone Encounter (Signed)
Spoke with daughter and pt had weight gain weighed 178 lbs Per daughter increased  Lasix to 40 mg for 3 days and now pt weighs 171 .Encouraged daughter to resume Lasix as prescribed and will forward message to Rosaria Ferries PA for review and recommendations and also reminded to watch salt intake as well .Adonis Housekeeper

## 2017-05-16 NOTE — Telephone Encounter (Signed)
New Message                               175 and 178               40mg  weight coming down foresemide [phone]  Pt c/o medication issue:  1. Name of Medication: furosemide (LASIX) 20 MG tablet  2. How are you currently taking this medication (dosage and times per day)? Take 1 tablet by mouth on Mondays and Thursdays  3. Are you having a reaction (difficulty breathing--STAT)? Weight gain  4. What is your medication issue? Pt's daughter states that her mom's weight has been staying between 175-178 so she double pt's lasix to 40mg  and her weight came down. Please call

## 2017-05-17 NOTE — Telephone Encounter (Signed)
Left message to call back  

## 2017-05-17 NOTE — Telephone Encounter (Signed)
Lm to call back ./cy 

## 2017-05-17 NOTE — Telephone Encounter (Signed)
Taking extra Lasix is okay, just make sure that she knows to take a potassium tablet every time she takes the Lasix. Should probably not do it more than 3 days in a row.

## 2017-05-17 NOTE — Telephone Encounter (Signed)
Follow up    Patients daughter returning call. Please call.

## 2017-05-18 NOTE — Telephone Encounter (Signed)
Returned call to daughter message relayed to her, she states that pt is back to target weight and will go back to regular medication schedule and she will call if pt takes extra for 3 days to see what is happening.

## 2017-05-29 ENCOUNTER — Telehealth: Payer: Self-pay | Admitting: Cardiovascular Disease

## 2017-05-29 ENCOUNTER — Other Ambulatory Visit: Payer: Self-pay | Admitting: *Deleted

## 2017-05-29 DIAGNOSIS — R69 Illness, unspecified: Secondary | ICD-10-CM | POA: Diagnosis not present

## 2017-05-29 MED ORDER — OMEPRAZOLE 40 MG PO CPDR: 40.0000 mg | DELAYED_RELEASE_CAPSULE | Freq: Two times a day (BID) | ORAL | 3 refills | Status: DC

## 2017-05-29 NOTE — Telephone Encounter (Signed)
Daughter returned call. Patient has been coughing, wheezing. This started yesterday. Her temp was 99.8 yesterday. PCP office is going to refill albuterol. Advised PCP or urgent care eval if symptoms and/or fever persists. She voiced understanding.

## 2017-05-29 NOTE — Telephone Encounter (Signed)
LMTCB

## 2017-05-29 NOTE — Telephone Encounter (Signed)
New message  Pt [name] verbalzied that she is calling for RN  She states that pt has been coughing and wheezing  And she wants Dr.Croitoru to call in a prescription for pt

## 2017-06-04 ENCOUNTER — Telehealth: Payer: Self-pay | Admitting: Gastroenterology

## 2017-06-04 MED ORDER — AMITIZA 24 MCG PO CAPS: 24.0000 ug | ORAL_CAPSULE | Freq: Two times a day (BID) | ORAL | 3 refills | Status: DC

## 2017-06-04 NOTE — Telephone Encounter (Signed)
No Amitiza samples   Per patients request sent in Amitiza to pharmacy

## 2017-06-04 NOTE — Telephone Encounter (Signed)
[name] calling back and is requesting amitiza refill to be sent to [facility] on [address]

## 2017-06-05 ENCOUNTER — Other Ambulatory Visit: Payer: Self-pay | Admitting: Cardiology

## 2017-06-05 ENCOUNTER — Telehealth: Payer: Self-pay | Admitting: Cardiovascular Disease

## 2017-06-05 MED ORDER — FUROSEMIDE 20 MG PO TABS: 60.0000 mg | ORAL_TABLET | Freq: Every day | ORAL | 3 refills | Status: DC | PRN

## 2017-06-05 MED ORDER — FUROSEMIDE 20 MG PO TABS: ORAL_TABLET | ORAL | 3 refills | Status: DC

## 2017-06-05 MED ORDER — WARFARIN SODIUM 3 MG PO TABS: ORAL_TABLET | ORAL | 1 refills | Status: DC

## 2017-06-05 NOTE — Telephone Encounter (Signed)
°*  STAT* If patient is at the pharmacy, call can be transferred to refill team.   1. Which medications need to be refilled? (please list name of each medication and dose if known) Warfarin-please call today  2. Which pharmacy/location (including street and city if local pharmacy) is [facility] to [facility] to?[facility] [phone]  3. Do they need a 30 day or 90 day supply? 60 and refills

## 2017-06-05 NOTE — Telephone Encounter (Signed)
New Message:         *STAT* If patient is at the pharmacy, call can be transferred to refill team.   1. Which medications need to be refilled? (please list name of each medication and dose if known) warfarin (COUMADIN) 3 MG tablet  2. Which pharmacy/location (including street and city if local pharmacy) is medication to be sent to?[id number] [place], [place] [address]  3. Do they need a 30 day or 90 day supply? [place]

## 2017-06-05 NOTE — Telephone Encounter (Signed)
Returned call to daughter she states that she needs [facility]

## 2017-06-05 NOTE — Telephone Encounter (Signed)
Pt's daughter called- pt has been out of Warfarin for two days, waiting for Rx refill. I called it in to [place] PA-C 06/05/2017 7:11 PM

## 2017-06-06 DIAGNOSIS — R269 Unspecified abnormalities of gait and mobility: Secondary | ICD-10-CM | POA: Diagnosis not present

## 2017-06-06 DIAGNOSIS — N19 Unspecified kidney failure: Secondary | ICD-10-CM | POA: Diagnosis not present

## 2017-06-06 DIAGNOSIS — I509 Heart failure, unspecified: Secondary | ICD-10-CM | POA: Diagnosis not present

## 2017-06-06 DIAGNOSIS — I259 Chronic ischemic heart disease, unspecified: Secondary | ICD-10-CM | POA: Diagnosis not present

## 2017-06-06 DIAGNOSIS — J45909 Unspecified asthma, uncomplicated: Secondary | ICD-10-CM | POA: Diagnosis not present

## 2017-06-06 DIAGNOSIS — J40 Bronchitis, not specified as acute or chronic: Secondary | ICD-10-CM | POA: Diagnosis not present

## 2017-06-06 DIAGNOSIS — E109 Type 1 diabetes mellitus without complications: Secondary | ICD-10-CM | POA: Diagnosis not present

## 2017-06-06 DIAGNOSIS — E119 Type 2 diabetes mellitus without complications: Secondary | ICD-10-CM | POA: Diagnosis not present

## 2017-06-06 DIAGNOSIS — M47812 Spondylosis without myelopathy or radiculopathy, cervical region: Secondary | ICD-10-CM | POA: Diagnosis not present

## 2017-06-07 ENCOUNTER — Ambulatory Visit (INDEPENDENT_AMBULATORY_CARE_PROVIDER_SITE_OTHER): Payer: Medicare HMO | Admitting: *Deleted

## 2017-06-07 ENCOUNTER — Telehealth: Payer: Self-pay

## 2017-06-07 DIAGNOSIS — I442 Atrioventricular block, complete: Secondary | ICD-10-CM

## 2017-06-07 NOTE — Telephone Encounter (Signed)
Confirmed remote transmssion with pt daughter

## 2017-06-08 NOTE — Progress Notes (Signed)
Remote pacemaker transmission.   

## 2017-06-10 ENCOUNTER — Other Ambulatory Visit: Payer: Self-pay | Admitting: Endocrinology

## 2017-06-10 DIAGNOSIS — R69 Illness, unspecified: Secondary | ICD-10-CM | POA: Diagnosis not present

## 2017-06-13 ENCOUNTER — Telehealth: Payer: Self-pay | Admitting: Cardiovascular Disease

## 2017-06-13 NOTE — Telephone Encounter (Signed)
Spoke with daughter and she is concerned with her mothers weight gain. She is currently taking Lasix 20 mg Monday and Thursday only. Her goal weight is 175 today 178. The last day she was at her goal weight was 5/18. She has been running 176-180. Her blood pressure this am was 110/66 and for the last several days SBP 104-114. She has been coughing and having some shortness of breath. She did get a Zpack from her PCP last week although she was not seen. She finished that on Sunday. Daughter is concerned with weight gain and requested appointment. Advised her to go ahead and have her Lasix and potassium today, scheduled appointment for 06/15/17. Will forward to Dr Sallyanne Kuster for review.

## 2017-06-13 NOTE — Telephone Encounter (Signed)
New Message:       Pt c/o swelling: STAT is pt has developed SOB within 24 hours  1) How much weight have you gained and in what time span? 3 lb  2) If swelling, where is the swelling located? stomach  3) Are you currently taking a fluid pill? yes  4) Are you currently SOB? No  5) Do you have a log of your daily weights (if so, list)?  176 179 178 176  6) Have you gained 3 pounds in a day or 5 pounds in a week? yes  7) Have you traveled recently? No

## 2017-06-14 NOTE — Telephone Encounter (Signed)
Advised Joyce.

## 2017-06-14 NOTE — Telephone Encounter (Signed)
I would recommend taking furosemide every day until her weight is 175, then go back to the previous regimen.

## 2017-06-15 ENCOUNTER — Encounter: Payer: Self-pay | Admitting: Cardiovascular Disease

## 2017-06-15 ENCOUNTER — Ambulatory Visit (INDEPENDENT_AMBULATORY_CARE_PROVIDER_SITE_OTHER): Payer: Medicare HMO | Admitting: Cardiovascular Disease

## 2017-06-15 ENCOUNTER — Ambulatory Visit (INDEPENDENT_AMBULATORY_CARE_PROVIDER_SITE_OTHER): Payer: Medicare HMO | Admitting: Pharmacist

## 2017-06-15 VITALS — BP 96/52 | HR 64 | Ht 64.0 in | Wt 176.0 lb

## 2017-06-15 DIAGNOSIS — Z7901 Long term (current) use of anticoagulants: Secondary | ICD-10-CM | POA: Diagnosis not present

## 2017-06-15 DIAGNOSIS — I480 Paroxysmal atrial fibrillation: Secondary | ICD-10-CM

## 2017-06-15 DIAGNOSIS — I442 Atrioventricular block, complete: Secondary | ICD-10-CM | POA: Diagnosis not present

## 2017-06-15 DIAGNOSIS — N183 Chronic kidney disease, stage 3 (moderate): Secondary | ICD-10-CM | POA: Diagnosis not present

## 2017-06-15 DIAGNOSIS — E7800 Pure hypercholesterolemia, unspecified: Secondary | ICD-10-CM

## 2017-06-15 DIAGNOSIS — Z950 Presence of cardiac pacemaker: Secondary | ICD-10-CM

## 2017-06-15 DIAGNOSIS — I5042 Chronic combined systolic (congestive) and diastolic (congestive) heart failure: Secondary | ICD-10-CM | POA: Diagnosis not present

## 2017-06-15 DIAGNOSIS — I25718 Atherosclerosis of autologous vein coronary artery bypass graft(s) with other forms of angina pectoris: Secondary | ICD-10-CM | POA: Diagnosis not present

## 2017-06-15 DIAGNOSIS — E1122 Type 2 diabetes mellitus with diabetic chronic kidney disease: Secondary | ICD-10-CM | POA: Diagnosis not present

## 2017-06-15 DIAGNOSIS — I82409 Acute embolism and thrombosis of unspecified deep veins of unspecified lower extremity: Secondary | ICD-10-CM

## 2017-06-15 LAB — CUP PACEART INCLINIC DEVICE CHECK
Date Time Interrogation Session: 20200131120519
Implantable Lead Implant Date: 20080613
Implantable Lead Implant Date: 20080613
Implantable Lead Location: 753860
Implantable Lead Model: 5076
Implantable Pulse Generator Implant Date: 20170606
MDC IDC LEAD LOCATION: 753859

## 2017-06-15 LAB — POCT INR: INR: 4.1 — AB (ref 2.0–3.0)

## 2017-06-15 NOTE — Progress Notes (Signed)
Cardiology Office Note    Date:  06/17/2017   ID:  [name], DOB Oct 01, 1920, MRN [id number]  PCP:  [name], MD  Cardiologist:   Sanda Klein, MD   Chief complaint: pacemaker follow up   History of Present Illness:  [name] is a 84 y.o. 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  She is recently having issues with weight gain and edema has gone up to 180 pounds, above her usual target weight of 175 pounds.  She is already improving after increasing her furosemide to daily rather than twice weekly.  She does not really have any problems with orthopnea or PND, but her feet are swollen.  Her blood pressure today is low at 96/52.  She does not have dizziness or syncope.  She denies palpitations.  She has not had any falls or bleeding.  She has occasional left-sided chest discomfort sounds, probably nonanginal, more suggestive of musculoskeletal.  She has not taken any nitroglycerin.  Pacemaker interrogation shows normal device function.  She has complete heart block and there are no detectable R waves.  She is pacemaker dependent.  There is 6.5% atrial pacing and 99% ventricular pacing.  The current generator is a dual-chamber Medtronic Adapta implanted in 2017 and has roughly 9.5 years of anticipated longevity.  She has not had any atrial fibrillation since her last device check.  She did have a brief nonsustained episode of paroxysmal atrial tachycardia at 200 bpm, lasting for 42 seconds.  No major cardiovascular events since her hospitalization with a small NSTEMI in July 2018, treated  conservatively in view of advanced age and renal insufficiency. The ventricular systolic function was unchanged at 45-50%. She does have a history of GI bleeding, none recently.   Past Medical History:  Diagnosis Date  . 2nd degree atrioventricular block    a. 06/22/15 Gen change: MDT ADDR01 Adapta, DC PPM (ser# [id number]).  . Allergy to perfume   . Anemia   . Arthritis   . Chronic combined systolic and diastolic CHF (congestive heart failure) ([place])    a. 01/2014 Echo: EF @ least mod-sev reduced with HK of lat/apical, basal inf walls. basalpost AK, Gr 1DD; b. 06/2015 Echo: EF 45-50%, Gr2 DD, mod LVH.   . CKD (chronic kidney disease), stage III ([place])    a. iii - iv.  . Coronary artery disease    a. 1994 s/p cabg;  b. 09/2013 MV: large, sev intensity, partially reversible inf, apical defect, prior inf/ap infarct w/ mild peri-infarct ischemia->Med Rx; c. 06/2015 NSTEMI (trop 6)->Med rx.  . Daily headache   . DVT of upper extremity (deep vein thrombosis) ([place]) 06/13/2012   BUE  . Dyspepsia   . Gastric ulcer   . GERD (gastroesophageal reflux disease)   . H/O: GI bleed 12//13  . Hiatal hernia   . High cholesterol   . History of blood transfusion 2013  . Hypertensive heart disease   . Hypothyroid   . Legally blind   . PAF (paroxysmal atrial fibrillation) (H[facility])    a. CHA2DS2VASc = 7-->coumadin.  . Presence of permanent cardiac pacemaker    a. 06/22/15 Gen change: MDT ADDR01 Adapta, DC PPM (ser# [id number]).  . Seasonal  allergies   . Type II diabetes mellitus ([place])   . UTI (lower urinary tract infection)     Past Surgical History:  Procedure Laterality Date  . CARDIAC CATHETERIZATION  10/25/92  . CARDIAC CATHETERIZATION  11/18/03   w/grafts 100%CX LAD 80 & 100%  . CARDIAC CATHETERIZATION  01/24/05   diffuse disease of native vessels  . CARDIAC CATHETERIZATION  06/06/06   severe native CAD  . CARDIOVERSION  06/06/06   successful  . CATARACT EXTRACTION W/ INTRAOCULAR LENS  IMPLANT, BILATERAL  Bilateral   . CORONARY ARTERY BYPASS GRAFT  10/27/92   LIMA to LAD,SVG to LAD second diagonal,obtuse maraginal of the CX and posterior descendingbranch of the RCA  . EP IMPLANTABLE DEVICE N/A 06/22/2015   Procedure: PPM/BIV PPM Generator Changeout;  Surgeon: Sanda Klein, MD;  Location: [facility];  Service: Cardiovascular;  Laterality: N/A;  . ESOPHAGOGASTRODUODENOSCOPY  12/22/2011   Procedure: ESOPHAGOGASTRODUODENOSCOPY (EGD);  Surgeon: Gatha Mayer, MD;  Location: [facility];  Service: Endoscopy;  Laterality: N/A;  . INSERT / REPLACE / REMOVE PACEMAKER  06/29/2006   Medtronic adapta  . REFRACTIVE SURGERY Bilateral     Current Medications: Outpatient Medications Prior to Visit  Medication Sig Dispense Refill  . ACCU-CHEK FASTCLIX LANCETS MISC Use to check sugar 2 times daily 200 each 11  . acetaminophen (TYLENOL) 500 MG tablet Take 500 mg by mouth every 6 (six) hours as needed (pain).    Marland Kitchen acetaminophen-codeine (TYLENOL #3) 300-30 MG tablet Take 1 tablet by mouth every 6 (six) hours as needed for severe pain.  0  . albuterol (PROAIR HFA) 108 (90 Base) MCG/ACT inhaler Inhale 2 puffs into the lungs every 6 (six) hours as needed for wheezing or shortness of breath.    . AMITIZA 24 MCG capsule Take 1 capsule (24 mcg total) by mouth 2 (two) times daily with a meal. 60 capsule 3  . ARGININE PO Take 1 capsule by mouth daily.    Marland Kitchen atropine 1 % ophthalmic solution Place 1 drop into both eyes daily as needed (burning eyes).   0  . Blood Glucose Monitoring Suppl (ACCU-CHEK AVIVA PLUS) w/Device KIT Use to check sugars 2 times daily. 1 kit 0  . docusate sodium (COLACE) 100 MG capsule Take 100 mg daily by mouth.    . donepezil (ARICEPT) 5 MG tablet Take 5 mg by mouth at bedtime.   0  . folic acid (FOLVITE) 1 MG tablet TAKE 1 TABLET BY MOUTH EVERY DAY 30 tablet 5  . gabapentin (NEURONTIN) 100 MG capsule Take 100 mg by mouth 2 (two) times daily.     Marland Kitchen glipiZIDE (GLUCOTROL) 5 MG tablet TAKE 1/2  PILL DAILY 45 tablet 1  . glucose blood test strip Use to test blood sugar twice daily 100 each 4  . guaifenesin (MUCUS RELIEF CHEST CONGESTION) 400 MG TABS tablet Take 400 mg by mouth 2 (two) times daily as needed (cough/ congestion).    Marland Kitchen ketoconazole (NIZORAL) 2 % cream 1 APPLICATION APPLY ON THE SKIN DAILY APPLY DAILY TO AFFECTED AREAS FOR MAINTENANCE  3  . levalbuterol (XOPENEX) 0.63 MG/3ML nebulizer solution Take 3 mLs by nebulization 3 (three) times daily as needed for wheezing or shortness of breath.   0  . loratadine (CLARITIN) 10 MG tablet Take 10 mg by mouth daily.    . magnesium citrate SOLN Take 1 Bottle by mouth every 7 (seven) days.    . meclizine (ANTIVERT) 25 MG tablet Take 25 mg  by mouth 3 (three) times daily as needed for dizziness.    Marland Kitchen ofloxacin (OCUFLOX) 0.3 % ophthalmic solution Place 1 drop into both eyes 4 (four) times daily as needed (for drainage).   0  . omeprazole (PRILOSEC) 40 MG capsule Take 1 capsule (40 mg total) by mouth 2 (two) times daily. 60 capsule 3  . OVER THE COUNTER MEDICATION Take 1 Can by mouth daily. Enterex nutritional supplement - with choline    . OVER THE COUNTER MEDICATION Kidney supplement capsules (Choline 100 mg, trimethylglycine 50 mg, glutathione 50 mg, methyltetrahydrofolate, l-arginine-, l-ornithine, l-citruline): Take 1 capsule by mouth two to three times a week    . Polyvinyl Alcohol-Povidone (REFRESH OP) Place 1 drop into both eyes daily as needed (dry eyes).     . Potassium Chloride CR (MICRO-K) 8 MEQ CPCR capsule CR Take 1 capsule on Mondays and Thursdays, the days you take your Lasix. 90 capsule 3  . rosuvastatin (CRESTOR) 20 MG tablet Take 1 tablet (20 mg total) by mouth daily. 90 tablet 1  . SYNTHROID 125 MCG tablet TAKE 1/2 TABLET BY MOUTH EVERY DAY 45 tablet 2  . traMADol (ULTRAM) 50 MG tablet Take 1 tablet (50 mg total) by mouth 2 (two) times daily as needed for moderate pain.    Marland Kitchen triamcinolone cream (KENALOG) 0.1 % 1 APPLICATION  APPLY ON THE SKIN TWICE A DAY APPLY TO AFFECTED AREAS TWICE DAILY AS NEEDED FOR FLARE  1  . warfarin (COUMADIN) 3 MG tablet TAKE 1 TABLET TO 1 AND 1/2 TABLETS EVERY DAY AS DIRECTED BY COUMADIN CLINIC. 140 tablet 1  . zolpidem (AMBIEN) 10 MG tablet Take 5-10 mg by mouth at bedtime as needed for sleep.   0  . furosemide (LASIX) 20 MG tablet Take 1.5 tabs PO on Mon,wed,fri and 1 tab tues,thurs,sat and sunday 180 tablet 3   No facility-administered medications prior to visit.      Allergies:   Macrobid [nitrofurantoin]; Darvon; Digoxin and related; Penicillins; Percocet [oxycodone-acetaminophen]; Percodan [oxycodone-aspirin]; and Vicodin [hydrocodone-acetaminophen]   Social History   Socioeconomic History  . Marital status: Widowed    Spouse name: Not on file  . Number of children: 10  . Years of education: Not on file  . Highest education level: Not on file  Occupational History    Employer: RETIRED  Social Needs  . Financial resource strain: Not on file  . Food insecurity:    Worry: Not on file    Inability: Not on file  . Transportation needs:    Medical: Not on file    Non-medical: Not on file  Tobacco Use  . Smoking status: Never Smoker  . Smokeless tobacco: Never Used  Substance and Sexual Activity  . Alcohol use: No    Alcohol/week: 0.0 oz  . Drug use: No  . Sexual activity: Never  Lifestyle  . Physical activity:    Days per week: Not on file    Minutes per session: Not on file  . Stress: Not on file  Relationships  . Social connections:    Talks on phone: Not on file    Gets together: Not on file    Attends religious service: Not on file    Active member of club or organization: Not on file    Attends meetings of clubs or organizations: Not on file    Relationship status: Not on file  Other Topics Concern  . Not on file  Social History Narrative  . Not on file  Family History:  The patient's family history includes Breast cancer in her daughter; Colon  polyps in her daughter; Diabetes in her brother, daughter, father, and son; Heart attack in her brother; Heart disease in her father; Stroke in her sister.   ROS:   Please see the history of present illness.    ROS All other systems reviewed and are negative.   PHYSICAL EXAM:   VS:  BP (!) 96/52 (BP Location: Left Arm, Patient Position: Sitting, Cuff Size: Large)   Pulse 64   Ht _0  (1.626 m)   Wt 176 lb (79.8 kg)   BMI 30.21 kg/m      General: Alert, oriented x3, no distress, obese, in wheelchair: Healthy left subclavian pacemaker site Head: no evidence of trauma, PERRL, EOMI, no exophtalmos or lid lag, no myxedema, no xanthelasma; normal ears, nose and oropharynx Neck: normal jugular venous pulsations and no hepatojugular reflux; brisk carotid pulses without delay and no carotid bruits Chest: clear to auscultation, no signs of consolidation by percussion or palpation, normal fremitus, symmetrical and full respiratory excursions Cardiovascular: normal position and quality of the apical impulse, regular rhythm, normal first and paradoxically split second heart sounds, no murmurs, rubs or gallops Abdomen: no tenderness or distention, no masses by palpation, no abnormal pulsatility or arterial bruits, normal bowel sounds, no hepatosplenomegaly Extremities: no clubbing, cyanosis; there is 2+ symmetrical pedal edema; 2+ radial, ulnar and brachial pulses bilaterally; 2+ right femoral, posterior tibial and dorsalis pedis pulses; 2+ left femoral, posterior tibial and dorsalis pedis pulses; no subclavian or femoral bruits Neurological: grossly nonfocal, legally blind Psych: Normal mood and affect   Wt Readings from Last 3 Encounters:  06/15/17 176 lb (79.8 kg)  04/12/17 169 lb (76.7 kg)  01/25/17 181 lb 3.2 oz (82.2 kg)      Studies/Labs Reviewed:   EKG:  EKG is ordered today.   It shows atrial sensed (sinus), ventricular paced rhythm, QTC 538 ms  Recent Labs: 06/19/2016: Magnesium  2.6 09/08/2016: B Natriuretic Peptide 306.1 05/10/2017: ALT 5; BUN 8; Creatinine, Ser 1.23; Hemoglobin 11.2; Platelets 176.0; Potassium 3.8; Sodium 140; TSH 1.36   Lipid Panel    Component Value Date/Time   CHOL 154 05/10/2017 1538   TRIG 214.0 (H) 05/10/2017 1538   HDL 31.30 (L) 05/10/2017 1538   CHOLHDL 5 05/10/2017 1538   VLDL 42.8 (H) 05/10/2017 1538   LDLCALC 141 (H) 10/25/2015 1118   LDLDIRECT 91.0 05/10/2017 1538     ASSESSMENT:    1. Chronic combined systolic and diastolic CHF (congestive heart failure) ([place])   2. PAF (paroxysmal atrial fibrillation) ([place])   3. Long term (current) use of anticoagulants   4. Complete heart block ([place])   5. Pacemaker   6. Coronary artery disease involving autologous vein coronary bypass graft with other forms of angina pectoris ([place])   7. Hypercholesterolemia   8. Type 2 diabetes mellitus with stage 3 chronic kidney disease, without long-term current use of insulin (H[facility])      PLAN:  In order of problems listed above:  1. CHF: We will increase diuretic dose to maintain her weight around 175 pounds.  Can be adjusted by her family as appropriate.  Other than edema, no other signs of heart failure clinically.  Unable to treat with other heart failure medications due to hypotension and kidney disease. 2. Afib: Is been long-term since been documented true atrial fibrillation burden of PAT has diminished considerably over the last 2 months.  Unfortunately she is  at very high embolic risk. CHADSVasc 21 (age 26, CVA 2, CHF, CAD, previous HTN, DM).  Continue warfarin as long as there were no bleeding complications. 3. Warfarin: no recent bleeding problems. 4. CHB: She is pacemaker dependent.   5. PPM: Normal check today. Plan q3 month device downloads.  Note the unusual pattern on her EKGs with positive R waves in lead V1, V2. Reviewed radiological images and the right ventricular lead is clearly in the right ventricular cavity, not the epicardial veins.  It's possible that the tip of the lead is located epicardially/microperforation. 6. CAD s/p CABG: no recent problems with angina pectoris, known reversible ischemia on nuclear stress test and previous non-STEMI.  Continue statin.  Off aspirin due to GI bleeding and concomitant treatment with warfarin. 7. HLP: She is on high-dose rosuvastatin.  Even though her LDL is not quite at target, I do not think any medications are already long list is a good idea in this patient. 8. DM: Excellent control, last hemoglobin A1c 6.5%.    Medication Adjustments/Labs and Tests Ordered: Current medicines are reviewed at length with the patient today.  Concerns regarding medicines are outlined above.  Medication changes, Labs and Tests ordered today are listed in the Patient Instructions below. Patient Instructions  Dr Sallyanne Kuster has recommended making the following medication changes: 1. TAKE Furosemide daily if weight is over 175 pounds, if weight is under 175, take furosemide 2 days a week  Remote monitoring is used to monitor your Pacemaker or ICD from home. This monitoring reduces the number of office visits required to check your device to one time per year. It allows [place] to keep an eye on the functioning of your device to ensure it is working properly. You are scheduled for a device check from home on Monday, August 26th, 2019. You may send your transmission at any time that day. If you have a wireless device, the transmission will be sent automatically. After your physician reviews your transmission, you will receive a postcard with your next transmission date.  To improve our patient care and to more adequately follow your device, [facility] has decided, as a practice, to start following each patient four times a year with your home monitor. This means that you may experience a remote appointment that is close to an in-office appointment with your physician. Your insurance will apply at the same rate as other  remote monitoring transmissions.  Dr Sallyanne Kuster recommends that you schedule a follow-up appointment in 6 months with a pacemaker check. You will receive a reminder letter in the mail two months in advance. If you don't receive a letter, please call our office to schedule the follow-up appointment.  If you need a refill on your cardiac medications before your next appointment, please call your pharmacy.    Signed, Sanda Klein, MD  06/17/2017 2:39 PM    [address], [place], [place]  [address] Phone: [phone]; Fax: [phone]

## 2017-06-15 NOTE — Patient Instructions (Signed)
Dr Sallyanne Kuster has recommended making the following medication changes: 1. TAKE Furosemide daily if weight is over 175 pounds, if weight is under 175, take furosemide 2 days a week  Remote monitoring is used to monitor your Pacemaker or ICD from home. This monitoring reduces the number of office visits required to check your device to one time per year. It allows [place] to keep an eye on the functioning of your device to ensure it is working properly. You are scheduled for a device check from home on Monday, August 26th, 2019. You may send your transmission at any time that day. If you have a wireless device, the transmission will be sent automatically. After your physician reviews your transmission, you will receive a postcard with your next transmission date.  To improve our patient care and to more adequately follow your device, [facility] has decided, as a practice, to start following each patient four times a year with your home monitor. This means that you may experience a remote appointment that is close to an in-office appointment with your physician. Your insurance will apply at the same rate as other remote monitoring transmissions.  Dr Sallyanne Kuster recommends that you schedule a follow-up appointment in 6 months with a pacemaker check. You will receive a reminder letter in the mail two months in advance. If you don't receive a letter, please call our office to schedule the follow-up appointment.  If you need a refill on your cardiac medications before your next appointment, please call your pharmacy.

## 2017-06-16 MED ORDER — FUROSEMIDE 20 MG PO TABS: 20.0000 mg | ORAL_TABLET | Freq: Every day | ORAL | 3 refills | Status: DC

## 2017-06-23 DIAGNOSIS — R69 Illness, unspecified: Secondary | ICD-10-CM | POA: Diagnosis not present

## 2017-07-02 NOTE — Telephone Encounter (Signed)
Called patient to send a manual transmission

## 2017-07-03 LAB — CUP PACEART REMOTE DEVICE CHECK
Battery Remaining Longevity: 113 mo
Battery Voltage: 2.79 V
Brady Statistic AP VP Percent: 7 %
Brady Statistic AS VP Percent: 93 %
Implantable Lead Implant Date: 20080613
Implantable Lead Model: 5076
Implantable Pulse Generator Implant Date: 20170606
Lead Channel Impedance Value: 372 Ohm
Lead Channel Pacing Threshold Amplitude: 1 V
Lead Channel Pacing Threshold Pulse Width: 0.4 ms
Lead Channel Setting Pacing Amplitude: 2 V
Lead Channel Setting Pacing Pulse Width: 0.4 ms
MDC IDC LEAD IMPLANT DT: 20080613
MDC IDC LEAD LOCATION: 753859
MDC IDC LEAD LOCATION: 753860
MDC IDC MSMT BATTERY IMPEDANCE: 152 Ohm
MDC IDC MSMT LEADCHNL RA PACING THRESHOLD PULSEWIDTH: 0.4 ms
MDC IDC MSMT LEADCHNL RV IMPEDANCE VALUE: 535 Ohm
MDC IDC MSMT LEADCHNL RV PACING THRESHOLD AMPLITUDE: 0.875 V
MDC IDC SESS DTM: 20190523201128
MDC IDC SET LEADCHNL RV PACING AMPLITUDE: 2.5 V
MDC IDC SET LEADCHNL RV SENSING SENSITIVITY: 5.6 mV
MDC IDC STAT BRADY AP VS PERCENT: 0 %
MDC IDC STAT BRADY AS VS PERCENT: 1 %

## 2017-07-06 ENCOUNTER — Ambulatory Visit (HOSPITAL_COMMUNITY)
Admission: EM | Admit: 2017-07-06 | Discharge: 2017-07-06 | Disposition: A | Discharge status: Home / Self Care | Payer: Medicare HMO | Attending: Internal Medicine | Admitting: Internal Medicine

## 2017-07-06 ENCOUNTER — Ambulatory Visit (INDEPENDENT_AMBULATORY_CARE_PROVIDER_SITE_OTHER): Payer: Medicare HMO

## 2017-07-06 ENCOUNTER — Encounter (HOSPITAL_COMMUNITY): Payer: Self-pay | Admitting: Emergency Medicine

## 2017-07-06 DIAGNOSIS — B349 Viral infection, unspecified: Secondary | ICD-10-CM | POA: Diagnosis not present

## 2017-07-06 DIAGNOSIS — R059 Cough, unspecified: Secondary | ICD-10-CM

## 2017-07-06 DIAGNOSIS — R05 Cough: Secondary | ICD-10-CM | POA: Diagnosis not present

## 2017-07-06 DIAGNOSIS — R509 Fever, unspecified: Secondary | ICD-10-CM | POA: Diagnosis not present

## 2017-07-06 NOTE — ED Provider Notes (Signed)
[place]   [id number] 07/06/17 Arrival Time: 63  SUBJECTIVE:  Hx obtained through legal guardian  [name] is a 84 y.o. female hx significant for HTN, anemia, hx of GI bleed, DVT, DM type II, 2nd degree heart block, hypothyroid, HLD, CAD, CKD stage IV, diastolic and systolic CHF, GERD, PAF, and pacemaker who presents with intermittent cough that began 4 days ago.  Denies positive sick exposure or precipitating event.  Describes cough as intermittent and productive with yellow/white sputum.  Has tried breathing treatments with relief.  Denies aggravating factors.  Denies previous symptoms in the past.   Complains of low-grade fever, chest congestion, and chronic pain.    Denies chills, fatigue, sinus pain, rhinorrhea, ear pain, sore throat, SOB, wheezing, chest pain, nausea, changes in bowel or bladder habits.    ROS: As per HPI.  Past Medical History:  Diagnosis Date  . 2nd degree atrioventricular block    a. 06/22/15 Gen change: MDT ADDR01 Adapta, DC PPM (ser# [id number]).  . Allergy to perfume   . Anemia   . Arthritis   . Chronic combined systolic and diastolic CHF (congestive heart failure) ([place])    a. 01/2014 Echo: EF @ least mod-sev reduced with HK of lat/apical, basal inf walls. basalpost AK, Gr 1DD; b. 06/2015 Echo: EF 45-50%, Gr2 DD, mod LVH.   . CKD (chronic kidney disease), stage III ([place])    a. iii - iv.  . Coronary artery disease    a. 1994 s/p cabg;  b. 09/2013 MV: large, sev intensity, partially reversible inf, apical defect, prior inf/ap infarct w/ mild peri-infarct ischemia->Med Rx; c. 06/2015 NSTEMI (trop 6)->Med rx.  . Daily headache   . DVT of upper extremity (deep vein thrombosis) ([place]) 06/13/2012   BUE  . Dyspepsia   . Gastric ulcer   . GERD (gastroesophageal reflux disease)   . H/O: GI bleed 12//13  . Hiatal hernia   . High cholesterol   . History of blood transfusion 2013  . Hypertensive heart disease   . Hypothyroid   . Legally blind    . PAF (paroxysmal atrial fibrillation) ([facility])    a. CHA2DS2VASc = 7-->coumadin.  . Presence of permanent cardiac pacemaker    a. 06/22/15 Gen change: MDT ADDR01 Adapta, DC PPM (ser# [id number]).  . Seasonal allergies   . Type II diabetes mellitus ([facility])   . UTI (lower urinary tract infection)    Past Surgical History:  Procedure Laterality Date  . CARDIAC CATHETERIZATION  10/25/92  . CARDIAC CATHETERIZATION  11/18/03   w/grafts 100%CX LAD 80 & 100%  . CARDIAC CATHETERIZATION  01/24/05   diffuse disease of native vessels  . CARDIAC CATHETERIZATION  06/06/06   severe native CAD  . CARDIOVERSION  06/06/06   successful  . CATARACT EXTRACTION W/ INTRAOCULAR LENS  IMPLANT, BILATERAL Bilateral   . CORONARY ARTERY BYPASS GRAFT  10/27/92   LIMA to LAD,SVG to LAD second diagonal,obtuse maraginal of the CX and posterior descendingbranch of the RCA  . EP IMPLANTABLE DEVICE N/A 06/22/2015   Procedure: PPM/BIV PPM Generator Changeout;  Surgeon: Sanda Klein, MD;  Location: [facility];  Service: Cardiovascular;  Laterality: N/A;  . ESOPHAGOGASTRODUODENOSCOPY  12/22/2011   Procedure: ESOPHAGOGASTRODUODENOSCOPY (EGD);  Surgeon: Gatha Mayer, MD;  Location: [facility];  Service: Endoscopy;  Laterality: N/A;  . INSERT / REPLACE / REMOVE PACEMAKER  06/29/2006   Medtronic adapta  . REFRACTIVE SURGERY Bilateral    Allergies  Allergen Reactions  .  Macrobid [Nitrofurantoin] Rash and Cough    Wheezing (also) THIS REACTION RESULTED IN THE PATIENT ENDING UP IN THE E.D.  . Darvon Other (See Comments)    Causes confusion  . Digoxin And Related Nausea Only  . Penicillins Other (See Comments)    From childhood: Has patient had a PCN reaction causing immediate rash, facial/tongue/throat swelling, SOB or lightheadedness with hypotension: Unknown Has patient had a PCN reaction causing severe rash involving mucus membranes or skin necrosis: Unknown Has patient had a PCN reaction that required  hospitalization: Unknown Has patient had a PCN reaction occurring within the last 10 years: No If all of the above answers are "NO", then may proceed with Cephalosporin use.   Marland Kitchen Percocet [Oxycodone-Acetaminophen] Other (See Comments)    Causes confusion  . Percodan [Oxycodone-Aspirin] Other (See Comments)    Causes confusion  . Vicodin [Hydrocodone-Acetaminophen] Other (See Comments)    Causes confusion   No current facility-administered medications on file prior to encounter.    Current Outpatient Medications on File Prior to Encounter  Medication Sig Dispense Refill  . ACCU-CHEK FASTCLIX LANCETS MISC Use to check sugar 2 times daily 200 each 11  . acetaminophen (TYLENOL) 500 MG tablet Take 500 mg by mouth every 6 (six) hours as needed (pain).    Marland Kitchen acetaminophen-codeine (TYLENOL #3) 300-30 MG tablet Take 1 tablet by mouth every 6 (six) hours as needed for severe pain.  0  . albuterol (PROAIR HFA) 108 (90 Base) MCG/ACT inhaler Inhale 2 puffs into the lungs every 6 (six) hours as needed for wheezing or shortness of breath.    . AMITIZA 24 MCG capsule Take 1 capsule (24 mcg total) by mouth 2 (two) times daily with a meal. 60 capsule 3  . ARGININE PO Take 1 capsule by mouth daily.    Marland Kitchen atropine 1 % ophthalmic solution Place 1 drop into both eyes daily as needed (burning eyes).   0  . Blood Glucose Monitoring Suppl (ACCU-CHEK AVIVA PLUS) w/Device KIT Use to check sugars 2 times daily. 1 kit 0  . docusate sodium (COLACE) 100 MG capsule Take 100 mg daily by mouth.    . donepezil (ARICEPT) 5 MG tablet Take 5 mg by mouth at bedtime.   0  . folic acid (FOLVITE) 1 MG tablet TAKE 1 TABLET BY MOUTH EVERY DAY 30 tablet 5  . furosemide (LASIX) 20 MG tablet Take 1 tablet (20 mg total) by mouth daily. 90 tablet 3  . gabapentin (NEURONTIN) 100 MG capsule Take 100 mg by mouth 2 (two) times daily.     Marland Kitchen glipiZIDE (GLUCOTROL) 5 MG tablet TAKE 1/2 PILL DAILY 45 tablet 1  . glucose blood test strip Use to  test blood sugar twice daily 100 each 4  . guaifenesin (MUCUS RELIEF CHEST CONGESTION) 400 MG TABS tablet Take 400 mg by mouth 2 (two) times daily as needed (cough/ congestion).    Marland Kitchen ketoconazole (NIZORAL) 2 % cream 1 APPLICATION APPLY ON THE SKIN DAILY APPLY DAILY TO AFFECTED AREAS FOR MAINTENANCE  3  . levalbuterol (XOPENEX) 0.63 MG/3ML nebulizer solution Take 3 mLs by nebulization 3 (three) times daily as needed for wheezing or shortness of breath.   0  . loratadine (CLARITIN) 10 MG tablet Take 10 mg by mouth daily.    . magnesium citrate SOLN Take 1 Bottle by mouth every 7 (seven) days.    . meclizine (ANTIVERT) 25 MG tablet Take 25 mg by mouth 3 (three) times daily as needed  for dizziness.    Marland Kitchen ofloxacin (OCUFLOX) 0.3 % ophthalmic solution Place 1 drop into both eyes 4 (four) times daily as needed (for drainage).   0  . omeprazole (PRILOSEC) 40 MG capsule Take 1 capsule (40 mg total) by mouth 2 (two) times daily. 60 capsule 3  . OVER THE COUNTER MEDICATION Take 1 Can by mouth daily. Enterex nutritional supplement - with choline    . OVER THE COUNTER MEDICATION Kidney supplement capsules (Choline 100 mg, trimethylglycine 50 mg, glutathione 50 mg, methyltetrahydrofolate, l-arginine-, l-ornithine, l-citruline): Take 1 capsule by mouth two to three times a week    . Polyvinyl Alcohol-Povidone (REFRESH OP) Place 1 drop into both eyes daily as needed (dry eyes).     . Potassium Chloride CR (MICRO-K) 8 MEQ CPCR capsule CR Take 1 capsule on Mondays and Thursdays, the days you take your Lasix. 90 capsule 3  . rosuvastatin (CRESTOR) 20 MG tablet Take 1 tablet (20 mg total) by mouth daily. 90 tablet 1  . SYNTHROID 125 MCG tablet TAKE 1/2 TABLET BY MOUTH EVERY DAY 45 tablet 2  . traMADol (ULTRAM) 50 MG tablet Take 1 tablet (50 mg total) by mouth 2 (two) times daily as needed for moderate pain.    Marland Kitchen triamcinolone cream (KENALOG) 0.1 % 1 APPLICATION APPLY ON THE SKIN TWICE A DAY APPLY TO AFFECTED AREAS TWICE  DAILY AS NEEDED FOR FLARE  1  . warfarin (COUMADIN) 3 MG tablet TAKE 1 TABLET TO 1 AND 1/2 TABLETS EVERY DAY AS DIRECTED BY COUMADIN CLINIC. 140 tablet 1  . zolpidem (AMBIEN) 10 MG tablet Take 5-10 mg by mouth at bedtime as needed for sleep.   0    Social History   Socioeconomic History  . Marital status: Widowed    Spouse name: Not on file  . Number of children: 10  . Years of education: Not on file  . Highest education level: Not on file  Occupational History    Employer: RETIRED  Social Needs  . Financial resource strain: Not on file  . Food insecurity:    Worry: Not on file    Inability: Not on file  . Transportation needs:    Medical: Not on file    Non-medical: Not on file  Tobacco Use  . Smoking status: Never Smoker  . Smokeless tobacco: Never Used  Substance and Sexual Activity  . Alcohol use: No    Alcohol/week: 0.0 oz  . Drug use: No  . Sexual activity: Never  Lifestyle  . Physical activity:    Days per week: Not on file    Minutes per session: Not on file  . Stress: Not on file  Relationships  . Social connections:    Talks on phone: Not on file    Gets together: Not on file    Attends religious service: Not on file    Active member of club or organization: Not on file    Attends meetings of clubs or organizations: Not on file    Relationship status: Not on file  . Intimate partner violence:    Fear of current or ex partner: Not on file    Emotionally abused: Not on file    Physically abused: Not on file    Forced sexual activity: Not on file  Other Topics Concern  . Not on file  Social History Narrative  . Not on file   Family History  Problem Relation Age of Onset  . Heart disease Father   .  Diabetes Father   . Breast cancer Daughter   . Diabetes Son   . Diabetes Daughter   . Colon polyps Daughter   . Heart attack Brother   . Diabetes Brother   . Stroke Sister   . Colon cancer Neg Hx      OBJECTIVE:  Vitals:   07/06/17 1129  BP: (!)  97/52  Pulse: 61  Resp: 18  Temp: 98.6 F (37 C)  TempSrc: Oral  SpO2: 96%     General appearance: Alert; not in acute distress HEENT: PERRL.  EOM grossly intact.  Sinuses nontender; mild clear rhinorrhea; tonsils nonerythematous, uvula midline Neck: supple without LAD Lungs: decreased breath sounds throughout bilateral lung fields Heart: regular rate and rhythm.  Radial pulses 1+ symmetrical bilaterally; no pitting edema of bilateral lower extremities Skin: warm and dry Psychological: alert and cooperative; normal mood and affect   DIAGNOSTIC STUDIES:     CLINICAL DATA: Cough for several weeks  EXAM: CHEST - 2 VIEW  COMPARISON: 09/10/2016  FINDINGS: Cardiac shadow is mildly enlarged but stable. Pacing device and postsurgical changes are again seen. Elevation the right hemidiaphragm is again noted. The lungs are clear bilaterally. No focal infiltrate or effusion is noted. No acute bony abnormality is seen.  IMPRESSION: No active cardiopulmonary disease.   Electronically Signed By: Inez Catalina M.D. On: 07/06/2017 12:45  I have reviewed the x-rays myself and the radiologist interpretation. I am in agreement with the radiologist interpretation.     ASSESSMENT & PLAN:  1. Cough   2. Low grade fever   3. Viral illness     Chest x-ray did not show signs of cardiopulmonary disease Rest and push fluids Follow up with PCP next week for reevaluation Return or go to the ED if you have any new or worsening symptoms  Reviewed expectations re: course of current medical issues. Questions answered. Outlined signs and symptoms indicating need for more acute intervention. Patient verbalized understanding. After Visit Summary given.          Lestine Box, PA-C 07/06/17 1301

## 2017-07-06 NOTE — ED Triage Notes (Signed)
Pt c/o fever, cough for several weeks. No pain.

## 2017-07-06 NOTE — Discharge Instructions (Addendum)
Chest x-ray did not show signs of cardiopulmonary disease Rest and push fluids Follow up with PCP next week for reevaluation Return or go to the ED if you have any new or worsening symptoms

## 2017-07-07 DIAGNOSIS — E109 Type 1 diabetes mellitus without complications: Secondary | ICD-10-CM | POA: Diagnosis not present

## 2017-07-07 DIAGNOSIS — J40 Bronchitis, not specified as acute or chronic: Secondary | ICD-10-CM | POA: Diagnosis not present

## 2017-07-07 DIAGNOSIS — I509 Heart failure, unspecified: Secondary | ICD-10-CM | POA: Diagnosis not present

## 2017-07-07 DIAGNOSIS — R269 Unspecified abnormalities of gait and mobility: Secondary | ICD-10-CM | POA: Diagnosis not present

## 2017-07-07 DIAGNOSIS — I259 Chronic ischemic heart disease, unspecified: Secondary | ICD-10-CM | POA: Diagnosis not present

## 2017-07-07 DIAGNOSIS — N19 Unspecified kidney failure: Secondary | ICD-10-CM | POA: Diagnosis not present

## 2017-07-10 ENCOUNTER — Other Ambulatory Visit: Payer: Self-pay | Admitting: Endocrinology

## 2017-07-10 DIAGNOSIS — R69 Illness, unspecified: Secondary | ICD-10-CM | POA: Diagnosis not present

## 2017-07-13 ENCOUNTER — Encounter: Payer: Self-pay | Admitting: Gastroenterology

## 2017-07-13 ENCOUNTER — Ambulatory Visit (INDEPENDENT_AMBULATORY_CARE_PROVIDER_SITE_OTHER): Payer: Medicare HMO | Admitting: Gastroenterology

## 2017-07-13 VITALS — BP 82/46 | HR 76 | Temp 99.5°F | Ht 64.0 in | Wt 180.0 lb

## 2017-07-13 DIAGNOSIS — R1084 Generalized abdominal pain: Secondary | ICD-10-CM

## 2017-07-13 DIAGNOSIS — R509 Fever, unspecified: Secondary | ICD-10-CM | POA: Diagnosis not present

## 2017-07-13 DIAGNOSIS — K219 Gastro-esophageal reflux disease without esophagitis: Secondary | ICD-10-CM

## 2017-07-13 DIAGNOSIS — R110 Nausea: Secondary | ICD-10-CM | POA: Diagnosis not present

## 2017-07-13 DIAGNOSIS — K5909 Other constipation: Secondary | ICD-10-CM

## 2017-07-13 NOTE — Progress Notes (Signed)
[name]    [id number]    1920-12-22  Primary Care Physician:Dean, Carolann Littler, MD  Referring Physician: [name], MD [address] [place], [place] [address]  Chief complaint: Abdominal pain, constipation, fever  HPI: 84 yr F here for follow-up visit accompanied by her daughter.  Patient complains of generalized abdominal pain, worse when she does not have a bowel movement or if she eats spicy food.  Her daughter is currently giving her Amitiza 24 mcg only 3 days a week, she gives it to her Monday Tuesday and Wednesday, stop set after that as she is afraid that she may have an episode of fecal incontinence while she is at church.  She has been having low-grade fever for past week, she is getting Tylenol as needed.  Her daughter took her to urgent care, was informed that it is probably a viral infection and was sent home.  She continues to have low-grade fever.  On review of system complain of burning sensation while urinating.  Did not have urine analysis or culture done while she was in the urgent care.  Patient has urinary incontinence and cannot stand up to provide a clean-catch urine, usually she needs to be catheterized to obtain the sample per her daughter Patient is also having intermittent nausea, belching and abdominal fullness   Outpatient Encounter Medications as of 07/13/2017  Medication Sig  . ACCU-CHEK FASTCLIX LANCETS MISC Use to check sugar 2 times daily  . acetaminophen (TYLENOL) 500 MG tablet Take 500 mg by mouth every 6 (six) hours as needed (pain).  Marland Kitchen acetaminophen-codeine (TYLENOL #3) 300-30 MG tablet Take 1 tablet by mouth every 6 (six) hours as needed for severe pain.  Marland Kitchen albuterol (PROAIR HFA) 108 (90 Base) MCG/ACT inhaler Inhale 2 puffs into the lungs every 6 (six) hours as needed for wheezing or shortness of breath.  . AMITIZA 24 MCG capsule Take 1 capsule (24 mcg total) by mouth 2 (two) times daily with a meal.  . ARGININE PO Take 1  capsule by mouth daily.  Marland Kitchen atropine 1 % ophthalmic solution Place 1 drop into both eyes daily as needed (burning eyes).   . Blood Glucose Monitoring Suppl (ACCU-CHEK AVIVA PLUS) w/Device KIT Use to check sugars 2 times daily.  Marland Kitchen docusate sodium (COLACE) 100 MG capsule Take 100 mg daily by mouth.  . donepezil (ARICEPT) 5 MG tablet Take 5 mg by mouth at bedtime.   . folic acid (FOLVITE) 1 MG tablet TAKE 1 TABLET BY MOUTH EVERY DAY  . furosemide (LASIX) 20 MG tablet Take 1 tablet (20 mg total) by mouth daily.  Marland Kitchen gabapentin (NEURONTIN) 100 MG capsule Take 100 mg by mouth 2 (two) times daily.   Marland Kitchen glipiZIDE (GLUCOTROL) 5 MG tablet TAKE 1/2 PILL DAILY  . guaifenesin (MUCUS RELIEF CHEST CONGESTION) 400 MG TABS tablet Take 400 mg by mouth 2 (two) times daily as needed (cough/ congestion).  Marland Kitchen ketoconazole (NIZORAL) 2 % cream 1 APPLICATION APPLY ON THE SKIN DAILY APPLY DAILY TO AFFECTED AREAS FOR MAINTENANCE  . levalbuterol (XOPENEX) 0.63 MG/3ML nebulizer solution Take 3 mLs by nebulization 3 (three) times daily as needed for wheezing or shortness of breath.   . loratadine (CLARITIN) 10 MG tablet Take 10 mg by mouth daily.  . magnesium citrate SOLN Take 1 Bottle by mouth every 7 (seven) days.  . meclizine (ANTIVERT) 25 MG tablet Take 25 mg by mouth 3 (three)  times daily as needed for dizziness.  Marland Kitchen ofloxacin (OCUFLOX) 0.3 % ophthalmic solution Place 1 drop into both eyes 4 (four) times daily as needed (for drainage).   Marland Kitchen omeprazole (PRILOSEC) 40 MG capsule Take 1 capsule (40 mg total) by mouth 2 (two) times daily.  Glory Rosebush VERIO test strip USE TO TEST BLOOD SUGAR TWICE A DAY  . OVER THE COUNTER MEDICATION Take 1 Can by mouth daily. Enterex nutritional supplement - with choline  . OVER THE COUNTER MEDICATION Kidney supplement capsules (Choline 100 mg, trimethylglycine 50 mg, glutathione 50 mg, methyltetrahydrofolate, l-arginine-, l-ornithine, l-citruline): Take 1 capsule by mouth two to three times a week    . Polyvinyl Alcohol-Povidone (REFRESH OP) Place 1 drop into both eyes daily as needed (dry eyes).   . Potassium Chloride CR (MICRO-K) 8 MEQ CPCR capsule CR Take 1 capsule on Mondays and Thursdays, the days you take your Lasix.  Marland Kitchen rosuvastatin (CRESTOR) 20 MG tablet Take 1 tablet (20 mg total) by mouth daily.  Marland Kitchen SYNTHROID 125 MCG tablet TAKE 1/2 TABLET BY MOUTH EVERY DAY  . traMADol (ULTRAM) 50 MG tablet Take 1 tablet (50 mg total) by mouth 2 (two) times daily as needed for moderate pain.  Marland Kitchen triamcinolone cream (KENALOG) 0.1 % 1 APPLICATION APPLY ON THE SKIN TWICE A DAY APPLY TO AFFECTED AREAS TWICE DAILY AS NEEDED FOR FLARE  . warfarin (COUMADIN) 3 MG tablet TAKE 1 TABLET TO 1 AND 1/2 TABLETS EVERY DAY AS DIRECTED BY COUMADIN CLINIC.  Marland Kitchen zolpidem (AMBIEN) 10 MG tablet Take 5-10 mg by mouth at bedtime as needed for sleep.    No facility-administered encounter medications on file as of 07/13/2017.     Allergies as of 07/13/2017 - Review Complete 07/13/2017  Allergen Reaction Noted  . Macrobid [nitrofurantoin] Rash and Cough 09/08/2016  . Darvon Other (See Comments) 03/14/2011  . Digoxin and related Nausea Only 04/22/2012  . Penicillins Other (See Comments) 12/17/2011  . Percocet [oxycodone-acetaminophen] Other (See Comments) 03/14/2011  . Percodan [oxycodone-aspirin] Other (See Comments) 09/12/2011  . Vicodin [hydrocodone-acetaminophen] Other (See Comments) 12/18/2011    Past Medical History:  Diagnosis Date  . 2nd degree atrioventricular block    a. 06/22/15 Gen change: MDT ADDR01 Adapta, DC PPM (ser# [id number]).  . Allergy to perfume   . Anemia   . Arthritis   . Chronic combined systolic and diastolic CHF (congestive heart failure) ([facility])    a. 01/2014 Echo: EF @ least mod-sev reduced with HK of lat/apical, basal inf walls. basalpost AK, Gr 1DD; b. 06/2015 Echo: EF 45-50%, Gr2 DD, mod LVH.   . CKD (chronic kidney disease), stage III ([place])    a. iii - iv.  . Coronary artery disease     a. 1994 s/p cabg;  b. 09/2013 MV: large, sev intensity, partially reversible inf, apical defect, prior inf/ap infarct w/ mild peri-infarct ischemia->Med Rx; c. 06/2015 NSTEMI (trop 6)->Med rx.  . Daily headache   . DVT of upper extremity (deep vein thrombosis) ([facility]) 06/13/2012   BUE  . Dyspepsia   . Gastric ulcer   . GERD (gastroesophageal reflux disease)   . H/O: GI bleed 12//13  . Hiatal hernia   . High cholesterol   . History of blood transfusion 2013  . Hypertensive heart disease   . Hypothyroid   . Legally blind   . PAF (paroxysmal atrial fibrillation) ([facility])    a. CHA2DS2VASc = 7-->coumadin.  . Presence of permanent cardiac pacemaker    a. 06/22/15 Gen  change: MDT ADDR01 Adapta, DC PPM (ser# [id number]).  . Seasonal allergies   . Type II diabetes mellitus ([facility])   . UTI (lower urinary tract infection)     Past Surgical History:  Procedure Laterality Date  . CARDIAC CATHETERIZATION  10/25/92  . CARDIAC CATHETERIZATION  11/18/03   w/grafts 100%CX LAD 80 & 100%  . CARDIAC CATHETERIZATION  01/24/05   diffuse disease of native vessels  . CARDIAC CATHETERIZATION  06/06/06   severe native CAD  . CARDIOVERSION  06/06/06   successful  . CATARACT EXTRACTION W/ INTRAOCULAR LENS  IMPLANT, BILATERAL Bilateral   . CORONARY ARTERY BYPASS GRAFT  10/27/92   LIMA to LAD,SVG to LAD second diagonal,obtuse maraginal of the CX and posterior descendingbranch of the RCA  . EP IMPLANTABLE DEVICE N/A 06/22/2015   Procedure: PPM/BIV PPM Generator Changeout;  Surgeon: Sanda Klein, MD;  Location: [facility];  Service: Cardiovascular;  Laterality: N/A;  . ESOPHAGOGASTRODUODENOSCOPY  12/22/2011   Procedure: ESOPHAGOGASTRODUODENOSCOPY (EGD);  Surgeon: Gatha Mayer, MD;  Location: [facility];  Service: Endoscopy;  Laterality: N/A;  . INSERT / REPLACE / REMOVE PACEMAKER  06/29/2006   Medtronic adapta  . REFRACTIVE SURGERY Bilateral     Family History  Problem Relation Age of Onset  . Heart disease  Father   . Diabetes Father   . Breast cancer Daughter   . Diabetes Son   . Diabetes Daughter   . Colon polyps Daughter   . Heart attack Brother   . Diabetes Brother   . Stroke Sister   . Colon cancer Neg Hx     Social History   Socioeconomic History  . Marital status: Widowed    Spouse name: Not on file  . Number of children: 10  . Years of education: Not on file  . Highest education level: Not on file  Occupational History    Employer: RETIRED  Social Needs  . Financial resource strain: Not on file  . Food insecurity:    Worry: Not on file    Inability: Not on file  . Transportation needs:    Medical: Not on file    Non-medical: Not on file  Tobacco Use  . Smoking status: Never Smoker  . Smokeless tobacco: Never Used  Substance and Sexual Activity  . Alcohol use: No    Alcohol/week: 0.0 oz  . Drug use: No  . Sexual activity: Never  Lifestyle  . Physical activity:    Days per week: Not on file    Minutes per session: Not on file  . Stress: Not on file  Relationships  . Social connections:    Talks on phone: Not on file    Gets together: Not on file    Attends religious service: Not on file    Active member of club or organization: Not on file    Attends meetings of clubs or organizations: Not on file    Relationship status: Not on file  . Intimate partner violence:    Fear of current or ex partner: Not on file    Emotionally abused: Not on file    Physically abused: Not on file    Forced sexual activity: Not on file  Other Topics Concern  . Not on file  Social History Narrative  . Not on file      Review of systems: Review of Systems  Constitutional: Negative for fever and chills.  HENT: For difficulty hearing and sinus problem.  She has pain in  the jaw when chewing on one side Eyes: Negative for blurred vision.  Respiratory: Negative for cough, shortness of breath and wheezing.   Cardiovascular: Negative for chest pain and palpitations.    Gastrointestinal: as per HPI Genitourinary: Negative for dysuria, urgency, frequency and hematuria.  Musculoskeletal: Positive for myalgias, back pain and joint pain.  Skin: Negative for itching and rash.  Neurological: Negative for dizziness, tremors, focal weakness, seizures and loss of consciousness.  Endo/Heme/Allergies: Positive for seasonal allergies.  Psychiatric/Behavioral: Negative for depression, suicidal ideas and hallucinations.  All other systems reviewed and are negative.   Physical Exam: Vitals:   07/13/17 1515  BP: (!) 82/46  Pulse: 76  Temp: 99.5 F (37.5 C)   Body mass index is 30.9 kg/m. Gen:      No acute distress, wheelchair-bound HEENT:  EOMI, sclera anicteric Neck:     No masses; no thyromegaly Lungs:    Clear to auscultation bilaterally; normal respiratory effort CV:         Regular rate and rhythm; no murmurs Abd:      + bowel sounds; soft, non-tender; no palpable masses, no distension Ext:    + edema; adequate peripheral perfusion Skin:      Warm and dry; no rash Neuro: alert and oriented x 3 Psych: normal mood and affect  Data Reviewed:  Reviewed labs, radiology imaging, old records and pertinent past GI work up   Assessment and Plan/Recommendations:  84 year old female with multiple comorbidities, wheelchair-bound with chronic constipation, GERD here for follow-up visit Abdominal pain likely multifactorial  Advised daughter to give her Amitiza 24 mcg twice daily, only skip the dose on Saturday night and Sunday morning to prevent episodes of alternating constipation and overflow diarrhea Increase fluid intake to 8 to 10 cups daily  Nausea and belching/ GERD Antireflux measures Small frequent meals Continue omeprazole daily  Low-grade fever and dysuria: Advised her to contact primary care physician to check for possible UTI and if she has fever > than 101 with any worsening symptoms to bring her to urgent care or emergency room  25 minutes  was spent face-to-face with the patient. Greater than 50% of the time used for counseling as well as treatment plan and follow-up. She had multiple questions which were answered to her satisfaction  K. Denzil Magnuson , MD [phone]    CC: [name], MD

## 2017-07-13 NOTE — Patient Instructions (Addendum)
Continue Amitiza 24 mcg twice daily   Call your primary care doctor with your concerns of your low grade fever  Follow up as needed   If you are age 82 or older, your body mass index should be between 23-30. Your Body mass index is 30.9 kg/m. If this is out of the aforementioned range listed, please consider follow up with your Primary Care Provider.  If you are age 93 or younger, your body mass index should be between 19-25. Your Body mass index is 30.9 kg/m. If this is out of the aformentioned range listed, please consider follow up with your Primary Care Provider.

## 2017-07-15 ENCOUNTER — Encounter: Payer: Self-pay | Admitting: Gastroenterology

## 2017-07-17 ENCOUNTER — Other Ambulatory Visit: Payer: Self-pay

## 2017-07-17 DIAGNOSIS — R69 Illness, unspecified: Secondary | ICD-10-CM | POA: Diagnosis not present

## 2017-07-17 MED ORDER — ACCU-CHEK FASTCLIX LANCETS MISC: 11 refills | Status: AC

## 2017-07-20 ENCOUNTER — Telehealth: Payer: Self-pay | Admitting: Cardiovascular Disease

## 2017-07-20 NOTE — Telephone Encounter (Signed)
New message:      Pt's daughter would like to know if she can bring the pt in on Monday at 11:00 to get her INR checked.

## 2017-07-20 NOTE — Telephone Encounter (Signed)
New Message:       Pt's daughter states that the pt is weighing 180 lbs and her target weight was 175.

## 2017-07-20 NOTE — Telephone Encounter (Signed)
Spoke to daughter, aware and verbalized understanding.

## 2017-07-20 NOTE — Telephone Encounter (Signed)
Appointment set for Thurs July 11 at [facility].  Daughter agreeable

## 2017-07-20 NOTE — Telephone Encounter (Signed)
Returned call to daughter who states patients weight is increased at 180 lbs.  She states she saw her PCP and her BP was low so they did not want to increase her fluid pill.  She states she "sometimes" gets SOB with exertion (they assist her), daughter notes some minor swelling in her LE.  Has been monitoring her salt intake.    Daily weights: 7/1 179.5 lb 7/3 179  7/4 180 BP 95/60 HR 73 7/5 181 BP 110/71 HR 81.  GI note from 6-28 in Epic, weight was 180 and BP 82/46 advised to increase fluid intake Takes furosemide (Lasix) 20 mg daily  Advised I would route to Dr. Sallyanne Kuster to review and advise

## 2017-07-20 NOTE — Telephone Encounter (Signed)
If she is not significantly short of breath, how about we increase the threshold for higher diuretic dose to 182 pounds MCr

## 2017-07-23 DIAGNOSIS — R69 Illness, unspecified: Secondary | ICD-10-CM | POA: Diagnosis not present

## 2017-07-26 ENCOUNTER — Ambulatory Visit (INDEPENDENT_AMBULATORY_CARE_PROVIDER_SITE_OTHER): Payer: Medicare HMO | Admitting: Pharmacist Clinician (PhC)/ Clinical Pharmacy Specialist

## 2017-07-26 DIAGNOSIS — I482 Chronic atrial fibrillation, unspecified: Secondary | ICD-10-CM

## 2017-07-26 DIAGNOSIS — Z7901 Long term (current) use of anticoagulants: Secondary | ICD-10-CM

## 2017-07-26 DIAGNOSIS — I82409 Acute embolism and thrombosis of unspecified deep veins of unspecified lower extremity: Secondary | ICD-10-CM

## 2017-07-26 LAB — POCT INR
INR: 4.6 — AB (ref 2.0–3.0)
INR: 4.6 — AB (ref 2.0–3.0)

## 2017-08-02 ENCOUNTER — Telehealth: Payer: Self-pay | Admitting: Cardiovascular Disease

## 2017-08-02 NOTE — Telephone Encounter (Signed)
Spoke with pt's daughter and advised of Dr. Lurline Del recommendation. Also advised to elevate her legs to see if that helps with swelling. Daughter verbalized understanding.

## 2017-08-02 NOTE — Telephone Encounter (Incomplete)
New Messagse     Pt c/o swelling: STAT is pt has developed SOB within 24 hours  1) How much weight have you gained and in what time span? 2 to 3 pds in a week  2) If swelling, where is the swelling located? Swelling in left foot 3) Are you currently taking a fluid pill? Yes  4) Are you currently SOB? No   5) Do you have a log of your daily weights (if so, list)?   07/18 weighted 181 07/17                  180  07/15                179 07/14                  176 07/13                   179 07/11                   180    6) Have you gained 3 pounds in a day or 5 pounds in a week? Yes  7) Have you traveled recently? Unknown

## 2017-08-02 NOTE — Telephone Encounter (Signed)
If her weight is unchanged and she has edema, it is more likely to be redistribution from one area to another, rather than excessive overall fluid gain.  If her shortness of breath is not worse, there is no reason to change her medications. MCr

## 2017-08-02 NOTE — Telephone Encounter (Signed)
Spoke with pt's daughter who reports her mother was seen by her pcp last week and had a little swelling in her legs. She reports the pcp didn't seemed to be too concerned and said that she may experience some swelling from time to time. Daughter states yesterday she noticed the swelling has increased in her left foot and leaves and indentation. She also reports abdomen distention. Pt's weight log as followed;  7/11-180 7/13-179 7/14-176 7/15-179 7/17-180 7/18-181  She reports her mother has been taking her 20 mg of lasix daily as prescribed and denies any SOB. Routing to Dr. Loletha Grayer for recommendation.

## 2017-08-06 DIAGNOSIS — E109 Type 1 diabetes mellitus without complications: Secondary | ICD-10-CM | POA: Diagnosis not present

## 2017-08-06 DIAGNOSIS — J40 Bronchitis, not specified as acute or chronic: Secondary | ICD-10-CM | POA: Diagnosis not present

## 2017-08-06 DIAGNOSIS — N19 Unspecified kidney failure: Secondary | ICD-10-CM | POA: Diagnosis not present

## 2017-08-06 DIAGNOSIS — I509 Heart failure, unspecified: Secondary | ICD-10-CM | POA: Diagnosis not present

## 2017-08-06 DIAGNOSIS — R269 Unspecified abnormalities of gait and mobility: Secondary | ICD-10-CM | POA: Diagnosis not present

## 2017-08-06 DIAGNOSIS — I259 Chronic ischemic heart disease, unspecified: Secondary | ICD-10-CM | POA: Diagnosis not present

## 2017-08-10 DIAGNOSIS — H6123 Impacted cerumen, bilateral: Secondary | ICD-10-CM | POA: Diagnosis not present

## 2017-08-14 DIAGNOSIS — E785 Hyperlipidemia, unspecified: Secondary | ICD-10-CM | POA: Diagnosis not present

## 2017-08-14 DIAGNOSIS — E1142 Type 2 diabetes mellitus with diabetic polyneuropathy: Secondary | ICD-10-CM | POA: Diagnosis not present

## 2017-08-14 DIAGNOSIS — G4700 Insomnia, unspecified: Secondary | ICD-10-CM | POA: Diagnosis not present

## 2017-08-14 DIAGNOSIS — H04129 Dry eye syndrome of unspecified lacrimal gland: Secondary | ICD-10-CM | POA: Diagnosis not present

## 2017-08-14 DIAGNOSIS — I509 Heart failure, unspecified: Secondary | ICD-10-CM | POA: Diagnosis not present

## 2017-08-14 DIAGNOSIS — D649 Anemia, unspecified: Secondary | ICD-10-CM | POA: Diagnosis not present

## 2017-08-14 DIAGNOSIS — E669 Obesity, unspecified: Secondary | ICD-10-CM | POA: Diagnosis not present

## 2017-08-14 DIAGNOSIS — I4891 Unspecified atrial fibrillation: Secondary | ICD-10-CM | POA: Diagnosis not present

## 2017-08-14 DIAGNOSIS — E039 Hypothyroidism, unspecified: Secondary | ICD-10-CM | POA: Diagnosis not present

## 2017-08-14 DIAGNOSIS — G8929 Other chronic pain: Secondary | ICD-10-CM | POA: Diagnosis not present

## 2017-08-18 ENCOUNTER — Other Ambulatory Visit: Payer: Self-pay | Admitting: Cardiology

## 2017-08-18 MED ORDER — WARFARIN SODIUM 3 MG PO TABS: ORAL_TABLET | ORAL | 1 refills | Status: DC

## 2017-08-22 ENCOUNTER — Telehealth: Payer: Self-pay | Admitting: Gastroenterology

## 2017-08-22 NOTE — Telephone Encounter (Signed)
We have 24 mcg Amitiza samples for pt

## 2017-08-24 DIAGNOSIS — M170 Bilateral primary osteoarthritis of knee: Secondary | ICD-10-CM | POA: Diagnosis not present

## 2017-08-28 DIAGNOSIS — N3020 Other chronic cystitis without hematuria: Secondary | ICD-10-CM | POA: Diagnosis not present

## 2017-08-28 DIAGNOSIS — R300 Dysuria: Secondary | ICD-10-CM | POA: Diagnosis not present

## 2017-08-28 DIAGNOSIS — N3000 Acute cystitis without hematuria: Secondary | ICD-10-CM | POA: Diagnosis not present

## 2017-08-31 ENCOUNTER — Telehealth: Payer: Self-pay | Admitting: Gastroenterology

## 2017-08-31 DIAGNOSIS — I5032 Chronic diastolic (congestive) heart failure: Secondary | ICD-10-CM | POA: Diagnosis not present

## 2017-08-31 DIAGNOSIS — I10 Essential (primary) hypertension: Secondary | ICD-10-CM | POA: Diagnosis not present

## 2017-08-31 DIAGNOSIS — J45901 Unspecified asthma with (acute) exacerbation: Secondary | ICD-10-CM | POA: Diagnosis not present

## 2017-08-31 DIAGNOSIS — E119 Type 2 diabetes mellitus without complications: Secondary | ICD-10-CM | POA: Diagnosis not present

## 2017-08-31 DIAGNOSIS — R69 Illness, unspecified: Secondary | ICD-10-CM | POA: Diagnosis not present

## 2017-08-31 DIAGNOSIS — N189 Chronic kidney disease, unspecified: Secondary | ICD-10-CM | POA: Diagnosis not present

## 2017-08-31 NOTE — Telephone Encounter (Signed)
Spoke with the patient's daughter. The patient is having difficulty pushing the stool out. She admits she does not feel the sensation or the urge to move her bowels. [name] will continue the Amitiza as she has been doing. She will try glycerin suppositories first. If she is unsuccessful she knows she can use magnesium citrate, half a bottle. Maintain hydration. Call with any further problems.

## 2017-09-05 ENCOUNTER — Telehealth: Payer: Self-pay | Admitting: Gastroenterology

## 2017-09-05 NOTE — Telephone Encounter (Signed)
Patient had good response to the glycerin suppository. She did not receive any laxatives. She does take Amitiza. Today and last night she has liquid stools. [name] usually holds the Amitiza starting Wednesday or Thursday each week to prevent the patient from have diarrhea stools in church on Sunday. She will call us if she acutely worsens or fails to improve.

## 2017-09-06 DIAGNOSIS — I509 Heart failure, unspecified: Secondary | ICD-10-CM | POA: Diagnosis not present

## 2017-09-06 DIAGNOSIS — E109 Type 1 diabetes mellitus without complications: Secondary | ICD-10-CM | POA: Diagnosis not present

## 2017-09-06 DIAGNOSIS — I259 Chronic ischemic heart disease, unspecified: Secondary | ICD-10-CM | POA: Diagnosis not present

## 2017-09-06 DIAGNOSIS — N19 Unspecified kidney failure: Secondary | ICD-10-CM | POA: Diagnosis not present

## 2017-09-06 DIAGNOSIS — R269 Unspecified abnormalities of gait and mobility: Secondary | ICD-10-CM | POA: Diagnosis not present

## 2017-09-06 DIAGNOSIS — J40 Bronchitis, not specified as acute or chronic: Secondary | ICD-10-CM | POA: Diagnosis not present

## 2017-09-07 ENCOUNTER — Telehealth: Payer: Self-pay | Admitting: Endocrinology

## 2017-09-07 DIAGNOSIS — I10 Essential (primary) hypertension: Secondary | ICD-10-CM | POA: Diagnosis not present

## 2017-09-07 DIAGNOSIS — I5032 Chronic diastolic (congestive) heart failure: Secondary | ICD-10-CM | POA: Diagnosis not present

## 2017-09-07 DIAGNOSIS — E119 Type 2 diabetes mellitus without complications: Secondary | ICD-10-CM | POA: Diagnosis not present

## 2017-09-07 DIAGNOSIS — J45901 Unspecified asthma with (acute) exacerbation: Secondary | ICD-10-CM | POA: Diagnosis not present

## 2017-09-07 NOTE — Telephone Encounter (Signed)
Patient's daughter [name] ph# [phone] called re: patient's blood sugars have been running -320, 221, 220, 181. Patient had a temperature of 99.3.  Please call [name] at the ph# listed herein to advise

## 2017-09-10 ENCOUNTER — Other Ambulatory Visit: Payer: Self-pay

## 2017-09-10 ENCOUNTER — Emergency Department (HOSPITAL_COMMUNITY): Payer: Medicare HMO

## 2017-09-10 ENCOUNTER — Telehealth: Payer: Self-pay

## 2017-09-10 ENCOUNTER — Encounter: Payer: Medicare HMO | Admitting: *Deleted

## 2017-09-10 ENCOUNTER — Encounter (HOSPITAL_COMMUNITY): Payer: Self-pay | Admitting: Pharmacy Technician

## 2017-09-10 ENCOUNTER — Inpatient Hospital Stay (HOSPITAL_COMMUNITY)
Admission: EM | Admit: 2017-09-10 | Discharge: 2017-09-14 | DRG: 871 | Disposition: A | Discharge status: Home Health | Payer: Medicare HMO | Attending: Internal Medicine | Admitting: Internal Medicine

## 2017-09-10 ENCOUNTER — Other Ambulatory Visit: Payer: Self-pay | Admitting: Gastroenterology

## 2017-09-10 ENCOUNTER — Other Ambulatory Visit: Payer: Self-pay | Admitting: Cardiovascular Disease

## 2017-09-10 DIAGNOSIS — I442 Atrioventricular block, complete: Secondary | ICD-10-CM | POA: Diagnosis present

## 2017-09-10 DIAGNOSIS — Z8249 Family history of ischemic heart disease and other diseases of the circulatory system: Secondary | ICD-10-CM

## 2017-09-10 DIAGNOSIS — I130 Hypertensive heart and chronic kidney disease with heart failure and stage 1 through stage 4 chronic kidney disease, or unspecified chronic kidney disease: Secondary | ICD-10-CM | POA: Diagnosis not present

## 2017-09-10 DIAGNOSIS — Z950 Presence of cardiac pacemaker: Secondary | ICD-10-CM

## 2017-09-10 DIAGNOSIS — Z7189 Other specified counseling: Secondary | ICD-10-CM | POA: Diagnosis not present

## 2017-09-10 DIAGNOSIS — N289 Disorder of kidney and ureter, unspecified: Secondary | ICD-10-CM

## 2017-09-10 DIAGNOSIS — I252 Old myocardial infarction: Secondary | ICD-10-CM

## 2017-09-10 DIAGNOSIS — R339 Retention of urine, unspecified: Secondary | ICD-10-CM | POA: Diagnosis present

## 2017-09-10 DIAGNOSIS — R0600 Dyspnea, unspecified: Secondary | ICD-10-CM | POA: Diagnosis not present

## 2017-09-10 DIAGNOSIS — Z7901 Long term (current) use of anticoagulants: Secondary | ICD-10-CM

## 2017-09-10 DIAGNOSIS — N390 Urinary tract infection, site not specified: Secondary | ICD-10-CM | POA: Diagnosis present

## 2017-09-10 DIAGNOSIS — Z803 Family history of malignant neoplasm of breast: Secondary | ICD-10-CM | POA: Diagnosis not present

## 2017-09-10 DIAGNOSIS — Z951 Presence of aortocoronary bypass graft: Secondary | ICD-10-CM | POA: Diagnosis not present

## 2017-09-10 DIAGNOSIS — I480 Paroxysmal atrial fibrillation: Secondary | ICD-10-CM | POA: Diagnosis present

## 2017-09-10 DIAGNOSIS — Z823 Family history of stroke: Secondary | ICD-10-CM | POA: Diagnosis not present

## 2017-09-10 DIAGNOSIS — J80 Acute respiratory distress syndrome: Secondary | ICD-10-CM | POA: Diagnosis not present

## 2017-09-10 DIAGNOSIS — E063 Autoimmune thyroiditis: Secondary | ICD-10-CM | POA: Diagnosis not present

## 2017-09-10 DIAGNOSIS — N183 Chronic kidney disease, stage 3 (moderate): Secondary | ICD-10-CM | POA: Diagnosis present

## 2017-09-10 DIAGNOSIS — E876 Hypokalemia: Secondary | ICD-10-CM

## 2017-09-10 DIAGNOSIS — I2510 Atherosclerotic heart disease of native coronary artery without angina pectoris: Secondary | ICD-10-CM | POA: Diagnosis present

## 2017-09-10 DIAGNOSIS — Z86718 Personal history of other venous thrombosis and embolism: Secondary | ICD-10-CM

## 2017-09-10 DIAGNOSIS — N179 Acute kidney failure, unspecified: Secondary | ICD-10-CM | POA: Diagnosis present

## 2017-09-10 DIAGNOSIS — D649 Anemia, unspecified: Secondary | ICD-10-CM | POA: Diagnosis present

## 2017-09-10 DIAGNOSIS — D631 Anemia in chronic kidney disease: Secondary | ICD-10-CM | POA: Diagnosis present

## 2017-09-10 DIAGNOSIS — R112 Nausea with vomiting, unspecified: Secondary | ICD-10-CM

## 2017-09-10 DIAGNOSIS — R791 Abnormal coagulation profile: Secondary | ICD-10-CM | POA: Diagnosis present

## 2017-09-10 DIAGNOSIS — I110 Hypertensive heart disease with heart failure: Secondary | ICD-10-CM | POA: Diagnosis not present

## 2017-09-10 DIAGNOSIS — I959 Hypotension, unspecified: Secondary | ICD-10-CM | POA: Diagnosis not present

## 2017-09-10 DIAGNOSIS — I509 Heart failure, unspecified: Secondary | ICD-10-CM | POA: Diagnosis not present

## 2017-09-10 DIAGNOSIS — A419 Sepsis, unspecified organism: Secondary | ICD-10-CM | POA: Diagnosis not present

## 2017-09-10 DIAGNOSIS — Z833 Family history of diabetes mellitus: Secondary | ICD-10-CM | POA: Diagnosis not present

## 2017-09-10 DIAGNOSIS — H548 Legal blindness, as defined in USA: Secondary | ICD-10-CM | POA: Diagnosis present

## 2017-09-10 DIAGNOSIS — Z7984 Long term (current) use of oral hypoglycemic drugs: Secondary | ICD-10-CM

## 2017-09-10 DIAGNOSIS — E1122 Type 2 diabetes mellitus with diabetic chronic kidney disease: Secondary | ICD-10-CM | POA: Diagnosis present

## 2017-09-10 DIAGNOSIS — R0602 Shortness of breath: Secondary | ICD-10-CM | POA: Diagnosis not present

## 2017-09-10 DIAGNOSIS — I5043 Acute on chronic combined systolic (congestive) and diastolic (congestive) heart failure: Secondary | ICD-10-CM | POA: Diagnosis present

## 2017-09-10 DIAGNOSIS — R062 Wheezing: Secondary | ICD-10-CM | POA: Diagnosis not present

## 2017-09-10 DIAGNOSIS — Z515 Encounter for palliative care: Secondary | ICD-10-CM | POA: Diagnosis not present

## 2017-09-10 DIAGNOSIS — D638 Anemia in other chronic diseases classified elsewhere: Secondary | ICD-10-CM | POA: Diagnosis not present

## 2017-09-10 DIAGNOSIS — Z79890 Hormone replacement therapy (postmenopausal): Secondary | ICD-10-CM

## 2017-09-10 DIAGNOSIS — J189 Pneumonia, unspecified organism: Secondary | ICD-10-CM

## 2017-09-10 DIAGNOSIS — R069 Unspecified abnormalities of breathing: Secondary | ICD-10-CM | POA: Diagnosis not present

## 2017-09-10 DIAGNOSIS — R6510 Systemic inflammatory response syndrome (SIRS) of non-infectious origin without acute organ dysfunction: Secondary | ICD-10-CM | POA: Diagnosis present

## 2017-09-10 DIAGNOSIS — R0989 Other specified symptoms and signs involving the circulatory and respiratory systems: Secondary | ICD-10-CM | POA: Diagnosis not present

## 2017-09-10 DIAGNOSIS — I340 Nonrheumatic mitral (valve) insufficiency: Secondary | ICD-10-CM | POA: Diagnosis not present

## 2017-09-10 LAB — HEPATIC FUNCTION PANEL
ALBUMIN: 2.1 g/dL — AB (ref 3.5–5.0)
ALT: 10 U/L (ref 0–44)
AST: 22 U/L (ref 15–41)
Alkaline Phosphatase: 108 U/L (ref 38–126)
BILIRUBIN INDIRECT: 0.5 mg/dL (ref 0.3–0.9)
Bilirubin, Direct: 0.2 mg/dL (ref 0.0–0.2)
TOTAL PROTEIN: 6.3 g/dL — AB (ref 6.5–8.1)
Total Bilirubin: 0.7 mg/dL (ref 0.3–1.2)

## 2017-09-10 LAB — CBC WITH DIFFERENTIAL/PLATELET
Abs Immature Granulocytes: 0.1 10*3/uL (ref 0.0–0.1)
BASOS ABS: 0.1 10*3/uL (ref 0.0–0.1)
Basophils Relative: 0 %
EOS PCT: 1 %
Eosinophils Absolute: 0.1 10*3/uL (ref 0.0–0.7)
HCT: 29.6 % — ABNORMAL LOW (ref 36.0–46.0)
HEMOGLOBIN: 9.1 g/dL — AB (ref 12.0–15.0)
Immature Granulocytes: 1 %
LYMPHS PCT: 7 %
Lymphs Abs: 1.3 10*3/uL (ref 0.7–4.0)
MCH: 27.5 pg (ref 26.0–34.0)
MCHC: 30.7 g/dL (ref 30.0–36.0)
MCV: 89.4 fL (ref 78.0–100.0)
MONO ABS: 1.3 10*3/uL — AB (ref 0.1–1.0)
Monocytes Relative: 7 %
Neutro Abs: 14.5 10*3/uL — ABNORMAL HIGH (ref 1.7–7.7)
Neutrophils Relative %: 84 %
PLATELETS: 232 10*3/uL (ref 150–400)
RBC: 3.31 MIL/uL — AB (ref 3.87–5.11)
RDW: 16.4 % — AB (ref 11.5–15.5)
WBC: 17.4 10*3/uL — AB (ref 4.0–10.5)

## 2017-09-10 LAB — I-STAT TROPONIN, ED: TROPONIN I, POC: 0.01 ng/mL (ref 0.00–0.08)

## 2017-09-10 LAB — BASIC METABOLIC PANEL
ANION GAP: 10 (ref 5–15)
BUN: 30 mg/dL — AB (ref 8–23)
CALCIUM: 8.6 mg/dL — AB (ref 8.9–10.3)
CO2: 25 mmol/L (ref 22–32)
Chloride: 105 mmol/L (ref 98–111)
Creatinine, Ser: 2.27 mg/dL — ABNORMAL HIGH (ref 0.44–1.00)
GFR calc Af Amer: 20 mL/min — ABNORMAL LOW (ref >60)
GFR calc non Af Amer: 17 mL/min — ABNORMAL LOW (ref >60)
GLUCOSE: 201 mg/dL — AB (ref 70–99)
POTASSIUM: 3.2 mmol/L — AB (ref 3.5–5.1)
SODIUM: 140 mmol/L (ref 135–145)

## 2017-09-10 LAB — PROTIME-INR
INR: 4.86 — AB
Prothrombin Time: 45 seconds — ABNORMAL HIGH (ref 11.4–15.2)

## 2017-09-10 LAB — BRAIN NATRIURETIC PEPTIDE: B NATRIURETIC PEPTIDE 5: 264.4 pg/mL — AB (ref 0.0–100.0)

## 2017-09-10 LAB — PROCALCITONIN: PROCALCITONIN: 1.43 ng/mL

## 2017-09-10 LAB — GLUCOSE, CAPILLARY: GLUCOSE-CAPILLARY: 276 mg/dL — AB (ref 70–99)

## 2017-09-10 MED ORDER — ACETAMINOPHEN 325 MG PO TABS
650.0000 mg | ORAL_TABLET | ORAL | Status: DC | PRN
  Administered 2017-09-12 – 2017-09-14 (×4): 650 mg via ORAL
  Filled 2017-09-10 (×4): qty 2

## 2017-09-10 MED ORDER — ALBUTEROL SULFATE (2.5 MG/3ML) 0.083% IN NEBU
5.0000 mg | INHALATION_SOLUTION | Freq: Once | RESPIRATORY_TRACT | Status: AC
  Administered 2017-09-10: 5 mg via RESPIRATORY_TRACT
  Filled 2017-09-10: qty 6

## 2017-09-10 MED ORDER — SODIUM CHLORIDE 0.9 % IV SOLN
1.0000 g | Freq: Once | INTRAVENOUS | Status: AC
  Administered 2017-09-10: 1 g via INTRAVENOUS
  Filled 2017-09-10: qty 10

## 2017-09-10 MED ORDER — INSULIN ASPART 100 UNIT/ML ~~LOC~~ SOLN
0.0000 [IU] | Freq: Three times a day (TID) | SUBCUTANEOUS | Status: DC
  Administered 2017-09-11: 7 [IU] via SUBCUTANEOUS
  Administered 2017-09-11: 5 [IU] via SUBCUTANEOUS
  Administered 2017-09-11: 3 [IU] via SUBCUTANEOUS
  Administered 2017-09-12 – 2017-09-14 (×4): 2 [IU] via SUBCUTANEOUS

## 2017-09-10 MED ORDER — LUBIPROSTONE 24 MCG PO CAPS
24.0000 ug | ORAL_CAPSULE | Freq: Two times a day (BID) | ORAL | Status: DC
  Administered 2017-09-11 – 2017-09-14 (×7): 24 ug via ORAL
  Filled 2017-09-10 (×7): qty 1

## 2017-09-10 MED ORDER — FUROSEMIDE 10 MG/ML IJ SOLN
20.0000 mg | Freq: Two times a day (BID) | INTRAMUSCULAR | Status: DC
  Administered 2017-09-11: 20 mg via INTRAVENOUS
  Filled 2017-09-10: qty 2

## 2017-09-10 MED ORDER — POTASSIUM CHLORIDE 10 MEQ/100ML IV SOLN
10.0000 meq | INTRAVENOUS | Status: AC
  Administered 2017-09-10: 10 meq via INTRAVENOUS
  Filled 2017-09-10: qty 100

## 2017-09-10 MED ORDER — INSULIN ASPART 100 UNIT/ML ~~LOC~~ SOLN
0.0000 [IU] | Freq: Every day | SUBCUTANEOUS | Status: DC
  Administered 2017-09-10: 3 [IU] via SUBCUTANEOUS

## 2017-09-10 MED ORDER — SODIUM CHLORIDE 0.9 % IV SOLN: 250.0000 mL | INTRAVENOUS | Status: DC | PRN

## 2017-09-10 MED ORDER — FUROSEMIDE 10 MG/ML IJ SOLN
20.0000 mg | INTRAMUSCULAR | Status: AC
  Administered 2017-09-10: 20 mg via INTRAVENOUS
  Filled 2017-09-10: qty 2

## 2017-09-10 MED ORDER — MECLIZINE HCL 25 MG PO TABS: 25.0000 mg | ORAL_TABLET | Freq: Three times a day (TID) | ORAL | Status: DC | PRN

## 2017-09-10 MED ORDER — DONEPEZIL HCL 5 MG PO TABS
5.0000 mg | ORAL_TABLET | Freq: Every day | ORAL | Status: DC
  Administered 2017-09-10 – 2017-09-13 (×4): 5 mg via ORAL
  Filled 2017-09-10 (×4): qty 1

## 2017-09-10 MED ORDER — METHYLPREDNISOLONE SODIUM SUCC 125 MG IJ SOLR
125.0000 mg | Freq: Once | INTRAMUSCULAR | Status: AC
  Administered 2017-09-10: 125 mg via INTRAVENOUS
  Filled 2017-09-10: qty 2

## 2017-09-10 MED ORDER — ATROPINE SULFATE 1 % OP SOLN
1.0000 [drp] | Freq: Every day | OPHTHALMIC | Status: DC | PRN
  Filled 2017-09-10: qty 2

## 2017-09-10 MED ORDER — DOXYCYCLINE HYCLATE 100 MG PO TABS
100.0000 mg | ORAL_TABLET | Freq: Once | ORAL | Status: AC
  Administered 2017-09-10: 100 mg via ORAL
  Filled 2017-09-10: qty 1

## 2017-09-10 MED ORDER — ACETAMINOPHEN 500 MG PO TABS
1000.0000 mg | ORAL_TABLET | Freq: Once | ORAL | Status: AC
  Administered 2017-09-10: 1000 mg via ORAL
  Filled 2017-09-10: qty 2

## 2017-09-10 MED ORDER — OFLOXACIN 0.3 % OP SOLN
1.0000 [drp] | Freq: Four times a day (QID) | OPHTHALMIC | Status: DC | PRN
  Filled 2017-09-10: qty 5

## 2017-09-10 MED ORDER — ALBUTEROL SULFATE (2.5 MG/3ML) 0.083% IN NEBU
2.5000 mg | INHALATION_SOLUTION | RESPIRATORY_TRACT | Status: DC | PRN
  Administered 2017-09-11: 2.5 mg via RESPIRATORY_TRACT
  Filled 2017-09-10: qty 3

## 2017-09-10 MED ORDER — ROSUVASTATIN CALCIUM 20 MG PO TABS
20.0000 mg | ORAL_TABLET | Freq: Every day | ORAL | Status: DC
  Administered 2017-09-11 – 2017-09-13 (×3): 20 mg via ORAL
  Filled 2017-09-10 (×4): qty 1

## 2017-09-10 MED ORDER — HEPARIN SODIUM (PORCINE) 5000 UNIT/ML IJ SOLN: 5000.0000 [IU] | Freq: Three times a day (TID) | INTRAMUSCULAR | Status: DC

## 2017-09-10 MED ORDER — GUAIFENESIN 200 MG PO TABS
400.0000 mg | ORAL_TABLET | Freq: Two times a day (BID) | ORAL | Status: DC | PRN
  Filled 2017-09-10: qty 2

## 2017-09-10 MED ORDER — ONDANSETRON HCL 4 MG/2ML IJ SOLN
4.0000 mg | Freq: Four times a day (QID) | INTRAMUSCULAR | Status: DC | PRN
  Administered 2017-09-12: 4 mg via INTRAVENOUS
  Filled 2017-09-10: qty 2

## 2017-09-10 MED ORDER — FOLIC ACID 1 MG PO TABS
1.0000 mg | ORAL_TABLET | Freq: Every day | ORAL | Status: DC
  Administered 2017-09-11 – 2017-09-14 (×4): 1 mg via ORAL
  Filled 2017-09-10 (×4): qty 1

## 2017-09-10 MED ORDER — SODIUM CHLORIDE 0.9 % IV SOLN: 500.0000 mg | INTRAVENOUS | Status: DC

## 2017-09-10 MED ORDER — SODIUM CHLORIDE 0.9% FLUSH: 3.0000 mL | INTRAVENOUS | Status: DC | PRN

## 2017-09-10 MED ORDER — SODIUM CHLORIDE 0.9 % IV SOLN: 1.0000 g | INTRAVENOUS | Status: DC

## 2017-09-10 MED ORDER — PANTOPRAZOLE SODIUM 40 MG PO TBEC
40.0000 mg | DELAYED_RELEASE_TABLET | Freq: Two times a day (BID) | ORAL | Status: DC
  Administered 2017-09-11 – 2017-09-14 (×7): 40 mg via ORAL
  Filled 2017-09-10 (×8): qty 1

## 2017-09-10 MED ORDER — ZOLPIDEM TARTRATE 5 MG PO TABS: 5.0000 mg | ORAL_TABLET | Freq: Every evening | ORAL | Status: DC | PRN

## 2017-09-10 MED ORDER — GABAPENTIN 100 MG PO CAPS
100.0000 mg | ORAL_CAPSULE | Freq: Two times a day (BID) | ORAL | Status: DC
  Administered 2017-09-10 – 2017-09-14 (×8): 100 mg via ORAL
  Filled 2017-09-10 (×8): qty 1

## 2017-09-10 MED ORDER — LEVOTHYROXINE SODIUM 125 MCG PO TABS
62.5000 ug | ORAL_TABLET | Freq: Every day | ORAL | Status: DC
  Administered 2017-09-11 – 2017-09-14 (×4): 62.5 ug via ORAL
  Filled 2017-09-10 (×4): qty 0.5

## 2017-09-10 MED ORDER — FENTANYL CITRATE (PF) 100 MCG/2ML IJ SOLN
12.5000 ug | INTRAMUSCULAR | Status: DC | PRN
  Administered 2017-09-10: 12.5 ug via INTRAVENOUS
  Filled 2017-09-10: qty 2

## 2017-09-10 MED ORDER — SODIUM CHLORIDE 0.9% FLUSH
3.0000 mL | Freq: Two times a day (BID) | INTRAVENOUS | Status: DC
  Administered 2017-09-10 – 2017-09-14 (×7): 3 mL via INTRAVENOUS

## 2017-09-10 NOTE — ED Notes (Signed)
Pt put on Purewick.   

## 2017-09-10 NOTE — ED Notes (Signed)
Patient transported to X-ray 

## 2017-09-10 NOTE — ED Notes (Signed)
Pt spitting green and white frothy mucus

## 2017-09-10 NOTE — Telephone Encounter (Signed)
She can take an additional half a tablet of glipizide when her sugar is over 200.  She needs to make an appointment for follow-up with same-day labs

## 2017-09-10 NOTE — Telephone Encounter (Signed)
Spoke with pt and reminded pt of remote transmission that is due today. Pt verbalized understanding.   

## 2017-09-10 NOTE — H&P (Addendum)
History and Physical    [name] IHK:[id number] DOB: 1920-05-05 DOA: 09/10/2017  PCP: [name], MD   Patient coming from: Home   Chief Complaint: SOB, productive cough, N/V   HPI: [name] is a 84 y.o. female with medical history significant for type 2 diabetes mellitus, heart block with pacer, chronic anemia, chronic combined systolic and diastolic CHF, hypothyroidism, and chronic kidney disease stage III, now presenting to the emergency department for evaluation of shortness of breath, productive cough, and nausea with vomiting.  Patient has been experiencing progressive shortness of breath with productive cough for the past several days and also developed nausea with nonbloody vomiting today.  She denies fevers, chills, or chest pain.  She reports bilateral lower extremity edema that is worse than usual.  She denies abdominal pain, but reports some generalized abdominal discomfort today.  EMS was called out, blood pressure was 90/50, and she was treated with 400 cc normal saline and DuoNeb prior to arrival in the ED.  ED Course: Upon arrival to the ED, patient is found to be afebrile, saturating adequately on room air, mildly tachypneic, tachycardic, and with blood pressure 93/63.  EKG features of paced rhythm and chest x-ray is notable for cardiomegaly with vascular congestion.  Chemistry panel features a potassium of 3.2 and creatinine 2.27, up from 1.23 in April.  CBC features a leukocytosis to 17,400 and a normocytic anemia with hemoglobin of 9.1.  INR is 4.86, troponin normal, and BNP elevated to 264.  Blood cultures were collected and the patient was treated with 125 mg IV Solu-Medrol, albuterol, 20 mg IV Lasix, acetaminophen, Rocephin, and doxycycline in the ED.  She remains dyspneic at rest with tachypnea, mild tachycardia, and will be admitted for ongoing evaluation and management.  Review of Systems:  All other systems reviewed and apart from HPI, are  negative.  Past Medical History:  Diagnosis Date  . 2nd degree atrioventricular block    a. 06/22/15 Gen change: MDT ADDR01 Adapta, DC PPM (ser# [id number]).  . Allergy to perfume   . Anemia   . Arthritis   . Chronic combined systolic and diastolic CHF (congestive heart failure) ([facility])    a. 01/2014 Echo: EF @ least mod-sev reduced with HK of lat/apical, basal inf walls. basalpost AK, Gr 1DD; b. 06/2015 Echo: EF 45-50%, Gr2 DD, mod LVH.   . CKD (chronic kidney disease), stage III ([facility])    a. iii - iv.  . Coronary artery disease    a. 1994 s/p cabg;  b. 09/2013 MV: large, sev intensity, partially reversible inf, apical defect, prior inf/ap infarct w/ mild peri-infarct ischemia->Med Rx; c. 06/2015 NSTEMI (trop 6)->Med rx.  . Daily headache   . DVT of upper extremity (deep vein thrombosis) ([facility]) 06/13/2012   BUE  . Dyspepsia   . Gastric ulcer   . GERD (gastroesophageal reflux disease)   . H/O: GI bleed 12//13  . Hiatal hernia   . High cholesterol   . History of blood transfusion 2013  . Hypertensive heart disease   . Hypothyroid   . Legally blind   . PAF (paroxysmal atrial fibrillation) ([facility])    a. CHA2DS2VASc = 7-->coumadin.  . Presence of permanent cardiac pacemaker    a. 06/22/15 Gen change: MDT ADDR01 Adapta, DC PPM (ser# [id number]).  . Seasonal allergies   . Type II diabetes mellitus ([facility])   . UTI (lower urinary tract infection)     Past Surgical History:  Procedure Laterality Date  .  CARDIAC CATHETERIZATION  10/25/92  . CARDIAC CATHETERIZATION  11/18/03   w/grafts 100%CX LAD 80 & 100%  . CARDIAC CATHETERIZATION  01/24/05   diffuse disease of native vessels  . CARDIAC CATHETERIZATION  06/06/06   severe native CAD  . CARDIOVERSION  06/06/06   successful  . CATARACT EXTRACTION W/ INTRAOCULAR LENS  IMPLANT, BILATERAL Bilateral   . CORONARY ARTERY BYPASS GRAFT  10/27/92   LIMA to LAD,SVG to LAD second diagonal,obtuse maraginal of the CX and posterior descendingbranch of the RCA  .  EP IMPLANTABLE DEVICE N/A 06/22/2015   Procedure: PPM/BIV PPM Generator Changeout;  Surgeon: Sanda Klein, MD;  Location: [facility];  Service: Cardiovascular;  Laterality: N/A;  . ESOPHAGOGASTRODUODENOSCOPY  12/22/2011   Procedure: ESOPHAGOGASTRODUODENOSCOPY (EGD);  Surgeon: Gatha Mayer, MD;  Location: [facility];  Service: Endoscopy;  Laterality: N/A;  . INSERT / REPLACE / REMOVE PACEMAKER  06/29/2006   Medtronic adapta  . REFRACTIVE SURGERY Bilateral      reports that she has never smoked. She has never used smokeless tobacco. She reports that she does not drink alcohol or use drugs.  Allergies  Allergen Reactions  . Macrobid [Nitrofurantoin] Rash and Cough    Wheezing (also) THIS REACTION RESULTED IN THE PATIENT ENDING UP IN THE E.D.  . Darvon Other (See Comments)    Causes confusion  . Digoxin And Related Nausea Only  . Penicillins Other (See Comments)    From childhood: Has patient had a PCN reaction causing immediate rash, facial/tongue/throat swelling, SOB or lightheadedness with hypotension: Unknown Has patient had a PCN reaction causing severe rash involving mucus membranes or skin necrosis: Unknown Has patient had a PCN reaction that required hospitalization: Unknown Has patient had a PCN reaction occurring within the last 10 years: No If all of the above answers are "NO", then may proceed with Cephalosporin use.   Marland Kitchen Percocet [Oxycodone-Acetaminophen] Other (See Comments)    Causes confusion  . Percodan [Oxycodone-Aspirin] Other (See Comments)    Causes confusion  . Vicodin [Hydrocodone-Acetaminophen] Other (See Comments)    Causes confusion    Family History  Problem Relation Age of Onset  . Heart disease Father   . Diabetes Father   . Breast cancer Daughter   . Diabetes Son   . Diabetes Daughter   . Colon polyps Daughter   . Heart attack Brother   . Diabetes Brother   . Stroke Sister   . Colon cancer Neg Hx      Prior to Admission medications    Medication Sig Start Date End Date Taking? Authorizing Provider  NUTRITIONAL SUPPLEMENTS PO Enterex Diabetic Nutritional Beverage:   Yes [provider]  ACCU-CHEK FASTCLIX LANCETS MISC Use to check sugar 2 times daily 07/17/17   Elayne Snare, MD  acetaminophen (TYLENOL) 500 MG tablet Take 500 mg by mouth every 6 (six) hours as needed (pain).    [provider]  acetaminophen-codeine (TYLENOL #3) 300-30 MG tablet Take 1 tablet by mouth every 6 (six) hours as needed for severe pain. 07/04/16   [provider]  albuterol (PROAIR HFA) 108 (90 Base) MCG/ACT inhaler Inhale 2 puffs into the lungs every 6 (six) hours as needed for wheezing or shortness of breath.    [provider]  AMITIZA 24 MCG capsule Take 1 capsule (24 mcg total) by mouth 2 (two) times daily with a meal. 06/04/17   Nandigam, Venia Minks, MD  ARGININE PO Take 1 capsule by mouth daily.  [provider]  atropine 1 % ophthalmic solution Place 1 drop into both eyes daily as needed (burning eyes).  10/29/13   [provider]  Blood Glucose Monitoring Suppl (ACCU-CHEK AVIVA PLUS) w/Device KIT Use to check sugars 2 times daily. 06/21/16   Elayne Snare, MD  docusate sodium (COLACE) 100 MG capsule Take 100 mg daily by mouth.    [provider]  donepezil (ARICEPT) 5 MG tablet Take 5 mg by mouth at bedtime.  07/13/14   [provider]  folic acid (FOLVITE) 1 MG tablet TAKE 1 TABLET BY MOUTH EVERY DAY 09/10/17   Croitoru, Mihai, MD  furosemide (LASIX) 20 MG tablet Take 1 tablet (20 mg total) by mouth daily. 06/16/17   Croitoru, Mihai, MD  gabapentin (NEURONTIN) 100 MG capsule Take 100 mg by mouth 2 (two) times daily.     [provider]  glipiZIDE (GLUCOTROL) 5 MG tablet TAKE 1/2 PILL DAILY Patient taking differently: Take 2.5 mg by mouth daily.  12/13/16   Elayne Snare, MD  guaifenesin (MUCUS RELIEF CHEST CONGESTION) 400 MG TABS tablet Take 400 mg by mouth 2 (two) times daily  as needed (cough/ congestion).    [provider]  ketoconazole (NIZORAL) 2 % cream Apply 1 application topically See admin instructions.  03/21/17   [provider]  levalbuterol Penne Lash) 0.63 MG/3ML nebulizer solution Take 3 mLs by nebulization 3 (three) times daily as needed for wheezing or shortness of breath.  09/06/16   [provider]  loratadine (CLARITIN) 10 MG tablet Take 10 mg by mouth daily.    [provider]  magnesium citrate SOLN Take 1 Bottle by mouth every 7 (seven) days.    [provider]  meclizine (ANTIVERT) 25 MG tablet Take 25 mg by mouth 3 (three) times daily as needed for dizziness.    [provider]  ofloxacin (OCUFLOX) 0.3 % ophthalmic solution Place 1 drop into both eyes 4 (four) times daily as needed (for drainage).  02/17/16   [provider]  omeprazole (PRILOSEC) 40 MG capsule TAKE 1 CAPSULE BY MOUTH TWICE A DAY 09/10/17   Nandigam, Venia Minks, MD  ONETOUCH VERIO test strip USE TO TEST BLOOD SUGAR TWICE A DAY Patient taking differently: 1 each by Other route 2 (two) times daily.  07/10/17   Elayne Snare, MD  OVER THE COUNTER MEDICATION Take 1 Can by mouth daily. Enterex nutritional supplement - with choline    [provider]  OVER THE COUNTER MEDICATION Kidney supplement capsules (Choline 100 mg, trimethylglycine 50 mg, glutathione 50 mg, methyltetrahydrofolate, l-arginine-, l-ornithine, l-citruline): Take 1 capsule by mouth two to three times a week    [provider]  Polyvinyl Alcohol-Povidone (REFRESH OP) Place 1 drop into both eyes daily as needed (dry eyes).     [provider]  Potassium Chloride CR (MICRO-K) 8 MEQ CPCR capsule CR Take 1 capsule on Mondays and Thursdays, the days you take your Lasix. 04/19/17   Croitoru, Mihai, MD  rosuvastatin (CRESTOR) 20 MG tablet Take 1 tablet (20 mg total) by mouth daily. 03/26/17   Croitoru, Mihai, MD  SYNTHROID 125 MCG tablet TAKE 1/2 TABLET  BY MOUTH EVERY DAY 06/12/17   Elayne Snare, MD  traMADol (ULTRAM) 50 MG tablet Take 1 tablet (50 mg total) by mouth 2 (two) times daily as needed for moderate pain. 09/11/16   Hongalgi, Lenis Dickinson, MD  triamcinolone cream (KENALOG) 0.1 % 1 APPLICATION APPLY ON THE SKIN TWICE A DAY  APPLY TO AFFECTED AREAS TWICE DAILY AS NEEDED FOR FLARE 03/21/17   [provider]  warfarin (COUMADIN) 3 MG tablet TAKE 1 TABLET TO 1 AND 1/2 TABLETS EVERY DAY AS DIRECTED BY COUMADIN CLINIC. 08/18/17   Nila Nephew, MD  zolpidem (AMBIEN) 10 MG tablet Take 5-10 mg by mouth at bedtime as needed for sleep.  08/08/16   [provider]    Physical Exam: Vitals:   09/10/17 1815 09/10/17 1845 09/10/17 1915 09/10/17 1940  BP: 109/60 108/78  94/63  Pulse: 98 (!) 103 (!) 108 (!) 103  Resp: '18 18 18 '$ (!) 22  Temp:      TempSrc:      SpO2: 100% 100% 100% 100%  Weight:      Height:          Constitutional: Tachypnea, dyspnea with speech, appears uncomfortable, no pallor or diaphoresis  Eyes: PERTLA, lids and conjunctivae normal ENMT: Mucous membranes are moist. Posterior pharynx clear of any exudate or lesions.   Neck: normal, supple, no masses, no thyromegaly Respiratory: Tachypnea. Diffuse rales. No pallor or cyanosis. No accessory muscle use.  Cardiovascular: S1 & S2 heard, regular rate and rhythm. 2+ pretibial pitting edema bilaterally. Abdomen: No distension, no tenderness, soft. Bowel sounds active.  Musculoskeletal: no clubbing / cyanosis. No joint deformity upper and lower extremities.   Skin: no significant rashes, lesions, ulcers. Warm, dry, well-perfused. Neurologic: No facial asymmetry. Gross hearing deficit. Sensation intact. Strength 5/5 in all 4 limbs.  Psychiatric: Alert and oriented to person, place, and situation. Pleasant, cooperative.     Labs on Admission: I have personally reviewed following labs and imaging studies  CBC: Recent Labs  Lab 09/10/17 1529  WBC 17.4*  NEUTROABS  14.5*  HGB 9.1*  HCT 29.6*  MCV 89.4  PLT 341   Basic Metabolic Panel: Recent Labs  Lab 09/10/17 1529  NA 140  K 3.2*  CL 105  CO2 25  GLUCOSE 201*  BUN 30*  CREATININE 2.27*  CALCIUM 8.6*   GFR: Estimated Creatinine Clearance: 16.5 mL/min (A) (by C-G formula based on SCr of 2.27 mg/dL (H)). Liver Function Tests: No results for input(s): AST, ALT, ALKPHOS, BILITOT, PROT, ALBUMIN in the last 168 hours. No results for input(s): LIPASE, AMYLASE in the last 168 hours. No results for input(s): AMMONIA in the last 168 hours. Coagulation Profile: Recent Labs  Lab 09/10/17 1529  INR 4.86*   Cardiac Enzymes: No results for input(s): CKTOTAL, CKMB, CKMBINDEX, TROPONINI in the last 168 hours. BNP (last 3 results) No results for input(s): PROBNP in the last 8760 hours. HbA1C: No results for input(s): HGBA1C in the last 72 hours. CBG: No results for input(s): GLUCAP in the last 168 hours. Lipid Profile: No results for input(s): CHOL, HDL, LDLCALC, TRIG, CHOLHDL, LDLDIRECT in the last 72 hours. Thyroid Function Tests: No results for input(s): TSH, T4TOTAL, FREET4, T3FREE, THYROIDAB in the last 72 hours. Anemia Panel: No results for input(s): VITAMINB12, FOLATE, FERRITIN, TIBC, IRON, RETICCTPCT in the last 72 hours. Urine analysis:    Component Value Date/Time   COLORURINE AMBER (A) 09/08/2016 1900   APPEARANCEUR TURBID (A) 09/08/2016 1900   LABSPEC 1.008 09/08/2016 1900   PHURINE 5.0 09/08/2016 1900   GLUCOSEU NEGATIVE 09/08/2016 1900   GLUCOSEU NEGATIVE 04/01/2013 1550   HGBUR MODERATE (A) 09/08/2016 1900   BILIRUBINUR NEGATIVE 09/08/2016 1900   KETONESUR NEGATIVE 09/08/2016 1900   PROTEINUR 30 (A) 09/08/2016 1900   UROBILINOGEN 1.0 08/16/2014 1521   NITRITE NEGATIVE  09/08/2016 1900   LEUKOCYTESUR LARGE (A) 09/08/2016 1900   Sepsis Labs: '@LABRCNTIP'$ (procalcitonin:4,lacticidven:4) )No results found for this or any previous visit (from the past 240 hour(s)).    Radiological Exams on Admission: Dg Chest 2 View  Result Date: 09/10/2017 CLINICAL DATA:  Dyspnea. EXAM: CHEST - 2 VIEW COMPARISON:  Radiographs of July 06, 2017. FINDINGS: Stable cardiomegaly. Status post coronary bypass graft. Left-sided pacemaker is unchanged in position. No pneumothorax or pleural effusion is noted. Mild central pulmonary vascular congestion is noted. No consolidative process is noted. Bony thorax is unremarkable. IMPRESSION: Cardiomegaly with mild central pulmonary vascular congestion. Electronically Signed   By: Marijo Conception, M.D.   On: 09/10/2017 15:21    EKG: Independently reviewed. Paced rhythm.   Assessment/Plan  1. Acute on chronic combined systolic & diastolic CHF  - Presents with several days of progressive SOB and productive cough, one day of N/V  - She appears hypervolemic, wt (if accurate) is up 20 lbs since late June, there is peripheral edema and rales on exam, and CXR appears congested  - She was treated with 20 mg IV Lasix in ED  - Continue diuresis with Lasix 20 mg IV q12h, may need to increase, will follow response  - Continue cardiac monitoring, repeat chem panel in am    2. Renal insufficiency  - SCr is 2.27 on admission, up from 1.23 in April  - She appears hypervolemic on admission and will be diuresed as above  - Check urine chemistries, renally-dose medications, follow daily chem panel during diuresis    3. SIRS  - Presents with SOB, productive cough, and N/V, found to have leukocytosis to 17,400 and tachycardia without fever  - With SOB and productive cough as primary complaints, she was treated empirically for CAP in ED  - Check UA, culture sputum, check strep pneumo antigen and procalcitonin, continue empiric CAP treatment for now    4. Type II DM  - A1c 6.5% in April  - Managed at home with glipizide, held on admission  - Check CBG's and use a low-intensity SSI with Novolog while in hospital    5. Paroxysmal atrial fibrillation;  supratherapeutic INR  - In sinus rhythm on admission  - CHADS-VASc is at least 24 (age x2, gender, CHF, DM)  - INR is supratherapeutic at 4.86 on admission without bleeding  - Hold warfarin, repeat INR in am   6. Hypokalemia  - Serum potassium is 3.2 on admission  - She has nausea with vomiting on admission and was supplemented with 30 mEq IV potassium  - Repeat chem panel in am   7. Nausea & vomiting  - Abdominal exam benign  - Check LFT's, continue prn antiemetics   8. Normocytic anemia  - Hgb is 9.1 on admission, down from 11.2 in April with no active bleeding   - Likely a dilutional component in setting of marked hypervolemia  - Continue nutritional supplements     DVT prophylaxis: Warfarin  Code Status: Limited, no intubation  Family Communication: Son updated at bedside Consults called: None   Admission status: Inpatient   Vianne Bulls, MD [facility] Pager [phone]  If 7PM-7AM, please contact night-coverage [email or web address] Password [facility]  09/10/2017, 7:57 PM

## 2017-09-10 NOTE — ED Provider Notes (Signed)
[facility] EMERGENCY DEPARTMENT Provider Note   CSN: [id number] Arrival date & time: 09/10/17  1415     History   Chief Complaint Chief Complaint  Patient presents with  . Shortness of Breath    HPI [name] is a 84 y.o. female.  84 year old female with extensive past medical history including atrial fibrillation, CKD, CHF, CAD s/p CABG, blindness who p/w shortness of breath.  Family members note that over the past 2 days she has started wheezing and the wheezing got worse today.  She has had an associated productive cough since last night.  She has had a few episodes of vomiting.  Her temperature has been fluctuating, highest was 100.7.  Her blood sugar has also been fluctuating despite compliance with medications.  She denies any associated chest or abdominal pain.  She received albuterol by EMS which she states does help her breathing.  No history of smoking or COPD.  The history is provided by the patient and a relative.  Shortness of Breath     Past Medical History:  Diagnosis Date  . 2nd degree atrioventricular block    a. 06/22/15 Gen change: MDT ADDR01 Adapta, DC PPM (ser# [id number]).  . Allergy to perfume   . Anemia   . Arthritis   . Chronic combined systolic and diastolic CHF (congestive heart failure) ([place])    a. 01/2014 Echo: EF @ least mod-sev reduced with HK of lat/apical, basal inf walls. basalpost AK, Gr 1DD; b. 06/2015 Echo: EF 45-50%, Gr2 DD, mod LVH.   . CKD (chronic kidney disease), stage III ([place])    a. iii - iv.  . Coronary artery disease    a. 1994 s/p cabg;  b. 09/2013 MV: large, sev intensity, partially reversible inf, apical defect, prior inf/ap infarct w/ mild peri-infarct ischemia->Med Rx; c. 06/2015 NSTEMI (trop 6)->Med rx.  . Daily headache   . DVT of upper extremity (deep vein thrombosis) ([place]) 06/13/2012   BUE  . Dyspepsia   . Gastric ulcer   . GERD (gastroesophageal reflux disease)   . H/O: GI bleed 12//13  .  Hiatal hernia   . High cholesterol   . History of blood transfusion 2013  . Hypertensive heart disease   . Hypothyroid   . Legally blind   . PAF (paroxysmal atrial fibrillation) (HCC)    a. CHA2DS2VASc = 7-->coumadin.  . Presence of permanent cardiac pacemaker    a. 06/22/15 Gen change: MDT ADDR01 Adapta, DC PPM (ser# [id number]).  . Seasonal allergies   . Type II diabetes mellitus ([place])   . UTI (lower urinary tract infection)     Patient Active Problem List   Diagnosis Date Noted  . History of urinary tract infection 09/20/2016  . Drug rash 09/08/2016  . UTI (urinary tract infection) 09/08/2016  . CAD (coronary artery disease) 02/29/2016  . Hypercholesterolemia 02/29/2016  . Hypertensive heart disease   . NSTEMI (non-ST elevated myocardial infarction) ([place])   . DNR (do not resuscitate) discussion   . Palliative care encounter   . Weakness   . Elevated troponin I level 06/24/2015  . Septic shock ([place]) 03/30/2015  . Syncope 03/30/2015  . Acute UTI   . Transaminitis   . Chronic combined systolic and diastolic CHF (congestive heart failure) ([place])   . Chronic atrial fibrillation ([place])   . Other specified hypothyroidism   . Pressure ulcer 08/17/2014  . Blood poisoning   . Cancer of central portion of left female breast ([place]) 07/30/2014  .  Periumbilical abdominal pain 06/26/2014  . Abnormal TSH 05/28/2014  . CKD (chronic kidney disease), stage III ([place]) 05/28/2014  . Acute on chronic combined systolic and diastolic CHF (congestive heart failure) ([place])   . Acute kidney injury ([place])   . SOB (shortness of breath) 05/24/2014  . Abnormal LFTs (liver function tests)   . Epigastric pain   . Pancreatitis 02/04/2014  . Elevated LFTs   . Complete heart block ([place]) 11/19/2013  . Acquired autoimmune hypothyroidism 10/24/2013  . Arm pain, left 08/24/2013  . Abdominal pain, left lower quadrant 06/30/2013  . Dyspnea 02/23/2013  . PAF (paroxysmal atrial fibrillation) ([place]) 11/19/2012  .  History of DVT (deep vein thrombosis) 11/18/2012  . Chest pain with low risk of acute coronary syndrome 11/01/2012  . Hypothyroidism 07/21/2012  . Long term current use of anticoagulant therapy 06/20/2012  . History of pulmonary embolism 2014 06/18/2012  . Fever 02/22/2012  . Malnutrition ([place]) 01/01/2012  . Dyspepsia 12/30/2011  . Acute lower UTI 12/29/2011  . Hiatal hernia 12/29/2011  . Gastric ulcer 12/22/2011  . Fatigue 12/21/2011  . Anemia of chronic disease 12/19/2011  . Nausea with vomiting 12/19/2011  . Anorexia 12/19/2011  . Infection, staphylococcal 12/18/2011  . Hypertension 12/14/2011  . Hypotension 12/14/2011  . Pacemaker 12/14/2011  . Hypokalemia 12/14/2011  . Hyponatremia 12/14/2011  . Microcytic anemia 10/19/2011  . Abnormal weight loss 10/19/2011  . Diabetes mellitus ([place]) 10/19/2011  . Hx of CABG-CABG in 1994. Myoview abnormal Aug 2015- medical Rx 10/19/2011    Past Surgical History:  Procedure Laterality Date  . CARDIAC CATHETERIZATION  10/25/92  . CARDIAC CATHETERIZATION  11/18/03   w/grafts 100%CX LAD 80 & 100%  . CARDIAC CATHETERIZATION  01/24/05   diffuse disease of native vessels  . CARDIAC CATHETERIZATION  06/06/06   severe native CAD  . CARDIOVERSION  06/06/06   successful  . CATARACT EXTRACTION W/ INTRAOCULAR LENS  IMPLANT, BILATERAL Bilateral   . CORONARY ARTERY BYPASS GRAFT  10/27/92   LIMA to LAD,SVG to LAD second diagonal,obtuse maraginal of the CX and posterior descendingbranch of the RCA  . EP IMPLANTABLE DEVICE N/A 06/22/2015   Procedure: PPM/BIV PPM Generator Changeout;  Surgeon: Sanda Klein, MD;  Location: [facility];  Service: Cardiovascular;  Laterality: N/A;  . ESOPHAGOGASTRODUODENOSCOPY  12/22/2011   Procedure: ESOPHAGOGASTRODUODENOSCOPY (EGD);  Surgeon: Gatha Mayer, MD;  Location: [facility];  Service: Endoscopy;  Laterality: N/A;  . INSERT / REPLACE / REMOVE PACEMAKER  06/29/2006   Medtronic adapta  . REFRACTIVE SURGERY  Bilateral      OB History   None      Home Medications    Prior to Admission medications   Medication Sig Start Date End Date Taking? Authorizing Provider  ACCU-CHEK FASTCLIX LANCETS MISC Use to check sugar 2 times daily 07/17/17   Elayne Snare, MD  acetaminophen (TYLENOL) 500 MG tablet Take 500 mg by mouth every 6 (six) hours as needed (pain).    [provider]  acetaminophen-codeine (TYLENOL #3) 300-30 MG tablet Take 1 tablet by mouth every 6 (six) hours as needed for severe pain. 07/04/16   [provider]  albuterol (PROAIR HFA) 108 (90 Base) MCG/ACT inhaler Inhale 2 puffs into the lungs every 6 (six) hours as needed for wheezing or shortness of breath.    [provider]  AMITIZA 24 MCG capsule Take 1 capsule (24 mcg total) by mouth 2 (two) times daily with a meal. 06/04/17   Nandigam, Venia Minks,  MD  ARGININE PO Take 1 capsule by mouth daily.    [provider]  atropine 1 % ophthalmic solution Place 1 drop into both eyes daily as needed (burning eyes).  10/29/13   [provider]  Blood Glucose Monitoring Suppl (ACCU-CHEK AVIVA PLUS) w/Device KIT Use to check sugars 2 times daily. 06/21/16   Elayne Snare, MD  docusate sodium (COLACE) 100 MG capsule Take 100 mg daily by mouth.    [provider]  donepezil (ARICEPT) 5 MG tablet Take 5 mg by mouth at bedtime.  07/13/14   [provider]  folic acid (FOLVITE) 1 MG tablet TAKE 1 TABLET BY MOUTH EVERY DAY 03/26/17   Croitoru, Mihai, MD  furosemide (LASIX) 20 MG tablet Take 1 tablet (20 mg total) by mouth daily. 06/16/17   Croitoru, Mihai, MD  gabapentin (NEURONTIN) 100 MG capsule Take 100 mg by mouth 2 (two) times daily.     [provider]  glipiZIDE (GLUCOTROL) 5 MG tablet TAKE 1/2 PILL DAILY 12/13/16   Elayne Snare, MD  guaifenesin (MUCUS RELIEF CHEST CONGESTION) 400 MG TABS tablet Take 400 mg by mouth 2 (two) times daily as needed (cough/ congestion).    [provider]  ketoconazole (NIZORAL) 2 % cream 1 APPLICATION APPLY ON THE SKIN DAILY APPLY DAILY TO AFFECTED AREAS FOR MAINTENANCE 03/21/17   [provider]  levalbuterol Penne Lash) 0.63 MG/3ML nebulizer solution Take 3 mLs by nebulization 3 (three) times daily as needed for wheezing or shortness of breath.  09/06/16   [provider]  loratadine (CLARITIN) 10 MG tablet Take 10 mg by mouth daily.    [provider]  magnesium citrate SOLN Take 1 Bottle by mouth every 7 (seven) days.    [provider]  meclizine (ANTIVERT) 25 MG tablet Take 25 mg by mouth 3 (three) times daily as needed for dizziness.    [provider]  ofloxacin (OCUFLOX) 0.3 % ophthalmic solution Place 1 drop into both eyes 4 (four) times daily as needed (for drainage).  02/17/16   [provider]  omeprazole (PRILOSEC) 40 MG capsule TAKE 1 CAPSULE BY MOUTH TWICE A DAY 09/10/17   Mauri Pole, MD  ONETOUCH VERIO test strip USE TO TEST BLOOD SUGAR TWICE A DAY 07/10/17   Elayne Snare, MD  OVER THE COUNTER MEDICATION Take 1 Can by mouth daily. Enterex nutritional supplement - with choline    [provider]  OVER THE COUNTER MEDICATION Kidney supplement capsules (Choline 100 mg, trimethylglycine 50 mg, glutathione 50 mg, methyltetrahydrofolate, l-arginine-, l-ornithine, l-citruline): Take 1 capsule by mouth two to three times a week    [provider]  Polyvinyl Alcohol-Povidone (REFRESH OP) Place 1 drop into both eyes daily as needed (dry eyes).     [provider]  Potassium Chloride CR (MICRO-K) 8 MEQ CPCR capsule CR Take 1 capsule on Mondays and Thursdays, the days you take your Lasix. 04/19/17   Croitoru, Mihai, MD  rosuvastatin (CRESTOR) 20 MG tablet Take 1 tablet (20 mg total) by mouth daily. 03/26/17   Croitoru, Mihai, MD  SYNTHROID 125 MCG tablet TAKE 1/2 TABLET BY MOUTH EVERY DAY 06/12/17   Elayne Snare, MD  traMADol (ULTRAM) 50 MG tablet Take 1 tablet (50 mg  total) by mouth 2 (two) times daily as needed for moderate pain. 09/11/16   Hongalgi, Lenis Dickinson, MD  triamcinolone cream (KENALOG) 0.1 % 1 APPLICATION APPLY ON THE SKIN TWICE A DAY APPLY TO AFFECTED AREAS  TWICE DAILY AS NEEDED FOR FLARE 03/21/17   [provider]  warfarin (COUMADIN) 3 MG tablet TAKE 1 TABLET TO 1 AND 1/2 TABLETS EVERY DAY AS DIRECTED BY COUMADIN CLINIC. 08/18/17   Nila Nephew, MD  zolpidem (AMBIEN) 10 MG tablet Take 5-10 mg by mouth at bedtime as needed for sleep.  08/08/16   [provider]    Family History Family History  Problem Relation Age of Onset  . Heart disease Father   . Diabetes Father   . Breast cancer Daughter   . Diabetes Son   . Diabetes Daughter   . Colon polyps Daughter   . Heart attack Brother   . Diabetes Brother   . Stroke Sister   . Colon cancer Neg Hx     Social History Social History   Tobacco Use  . Smoking status: Never Smoker  . Smokeless tobacco: Never Used  Substance Use Topics  . Alcohol use: No    Alcohol/week: 0.0 standard drinks  . Drug use: No     Allergies   Macrobid [nitrofurantoin]; Darvon; Digoxin and related; Penicillins; Percocet [oxycodone-acetaminophen]; Percodan [oxycodone-aspirin]; and Vicodin [hydrocodone-acetaminophen]   Review of Systems Review of Systems  Respiratory: Positive for shortness of breath.   All other systems reviewed and are negative except that which was mentioned in HPI    Physical Exam Updated Vital Signs BP 115/60 (BP Location: Right Arm)   Pulse 96   Temp 98.4 F (36.9 C) (Oral)   Resp 16   Ht '5\' 4"'$  (1.626 m)   Wt 93.9 kg   SpO2 100%   BMI 35.53 kg/m   Physical Exam  Constitutional: She is oriented to person, place, and time. She appears well-developed and well-nourished. No distress.  Mildly dyspneic  HENT:  Head: Normocephalic and atraumatic.  Moist mucous membranes  Eyes: Conjunctivae are normal.  Neck: Neck supple.  Cardiovascular: Normal rate and  regular rhythm.  Murmur heard. Pulmonary/Chest:  Mildly increased WOB without resp distress, insp/exp wheezes b/l with diminished breath sounds bases  Abdominal: Soft. Bowel sounds are normal. She exhibits no distension. There is no tenderness.  Musculoskeletal: She exhibits no edema.  Neurological: She is alert and oriented to person, place, and time.  Fluent speech  Skin: Skin is warm. She is diaphoretic.  Psychiatric: She has a normal mood and affect. Judgment normal.  Nursing note and vitals reviewed.    ED Treatments / Results  Labs (all labs ordered are listed, but only abnormal results are displayed) Labs Reviewed  BASIC METABOLIC PANEL - Abnormal; Notable for the following components:      Result Value   Potassium 3.2 (*)    Glucose, Bld 201 (*)    BUN 30 (*)    Creatinine, Ser 2.27 (*)    Calcium 8.6 (*)    GFR calc non Af Amer 17 (*)    GFR calc Af Amer 20 (*)    All other components within normal limits  CBC WITH DIFFERENTIAL/PLATELET - Abnormal; Notable for the following components:   WBC 17.4 (*)    RBC 3.31 (*)    Hemoglobin 9.1 (*)    HCT 29.6 (*)    RDW 16.4 (*)    Neutro Abs 14.5 (*)    Monocytes Absolute 1.3 (*)    All other components within normal limits  BRAIN NATRIURETIC PEPTIDE - Abnormal; Notable for the following components:   B Natriuretic Peptide 264.4 (*)    All other components within normal limits  PROTIME-INR - Abnormal; Notable for the following components:   Prothrombin Time 45.0 (*)    INR 4.86 (*)    All other components within normal limits  CULTURE, BLOOD (ROUTINE X 2)  CULTURE, BLOOD (ROUTINE X 2)  URINE CULTURE  EXPECTORATED SPUTUM ASSESSMENT W REFEX TO RESP CULTURE  GRAM STAIN  URINALYSIS, ROUTINE W REFLEX MICROSCOPIC  BASIC METABOLIC PANEL  STREP PNEUMONIAE URINARY ANTIGEN  MAGNESIUM  LEGIONELLA PNEUMOPHILA SEROGP 1 UR AG  PROCALCITONIN  PROCALCITONIN  HEPATIC FUNCTION PANEL  SODIUM, URINE, RANDOM  CREATININE, URINE,  RANDOM  UREA NITROGEN, URINE  PROTIME-INR  CBC WITH DIFFERENTIAL/PLATELET  I-STAT TROPONIN, ED    EKG EKG Interpretation  Date/Time:  Monday September 10 2017 15:03:02 EDT Ventricular Rate:  98 PR Interval:    QRS Duration: 199 QT Interval:  394 QTC Calculation: 504 R Axis:   -71 Text Interpretation:  Atrial-sensed ventricular-paced rhythm No further analysis attempted due to paced rhythm similar to previous Confirmed by Theotis Burrow [phone]) on 09/10/2017 3:05:46 PM Also confirmed by Theotis Burrow [phone]), editor Hattie Perch ([id number])  on 09/10/2017 3:06:16 PM   Radiology Dg Chest 2 View  Result Date: 09/10/2017 CLINICAL DATA:  Dyspnea. EXAM: CHEST - 2 VIEW COMPARISON:  Radiographs of July 06, 2017. FINDINGS: Stable cardiomegaly. Status post coronary bypass graft. Left-sided pacemaker is unchanged in position. No pneumothorax or pleural effusion is noted. Mild central pulmonary vascular congestion is noted. No consolidative process is noted. Bony thorax is unremarkable. IMPRESSION: Cardiomegaly with mild central pulmonary vascular congestion. Electronically Signed   By: Marijo Conception, M.D.   On: 09/10/2017 15:21    Procedures Procedures (including critical care time)  Medications Ordered in ED Medications  albuterol (PROVENTIL) (2.5 MG/3ML) 0.083% nebulizer solution 5 mg (has no administration in time range)  methylPREDNISolone sodium succinate (SOLU-MEDROL) 125 mg/2 mL injection 125 mg (has no administration in time range)     Initial Impression / Assessment and Plan / ED Course  I have reviewed the triage vital signs and the nursing notes.  Pertinent labs & imaging results that were available during my care of the patient were reviewed by me and considered in my medical decision making (see chart for details).     PT was dyspneic but not in respiratory distress, O2 sat mid 90s on RA. Wheezing noted b/l.  Gave Solu-Medrol and albuterol and obtained above lab work as  well as chest x-ray.  Labs show potassium 3.2, acute kidney injury with creatinine 2.27, WBC 17.4, hemoglobin 9.1, BNP 264, troponin, INR supratherapeutic at 4.86.  This may be partly related to her acute kidney injury.  X-ray shows cardiomegaly with pulmonary vascular congestion.  I suspect she may have a component of congestive heart failure and possible early community-acquired pneumonia given her productive cough with green sputum and malaise.  Recommended admission for further treatment.  Gave ceftriaxone and doxycycline as well as IV Lasix in the ED.  Discussed admission with [facility], Dr. Myna Hidalgo, and patient admitted for further care.  Final Clinical Impressions(s) / ED Diagnoses   Final diagnoses:  AKI (acute kidney injury) ([place])  Community acquired pneumonia, unspecified laterality  Acute on chronic congestive heart failure, unspecified heart failure type [facility])    ED Discharge Orders    None       Little, Wenda Overland, MD 09/10/17 2140

## 2017-09-10 NOTE — ED Notes (Signed)
Report attempted 

## 2017-09-10 NOTE — ED Triage Notes (Signed)
Pt arrives via EMS from home with reports of difficulty breathing. EMS reports audible wheezing after albuterol tx at home. EMS gave a duoneb on scene. Pt with emesis and green mucus. Pt with no complaints. 90/50, 95 HR, 100% RA, CBG 215. Given 400cc NS en route with BP on arrival 103/60.

## 2017-09-11 ENCOUNTER — Encounter: Payer: Self-pay | Admitting: Cardiology

## 2017-09-11 ENCOUNTER — Other Ambulatory Visit: Payer: Self-pay

## 2017-09-11 ENCOUNTER — Inpatient Hospital Stay (HOSPITAL_COMMUNITY): Payer: Medicare HMO

## 2017-09-11 DIAGNOSIS — N179 Acute kidney failure, unspecified: Secondary | ICD-10-CM

## 2017-09-11 DIAGNOSIS — A419 Sepsis, unspecified organism: Principal | ICD-10-CM

## 2017-09-11 DIAGNOSIS — N390 Urinary tract infection, site not specified: Secondary | ICD-10-CM

## 2017-09-11 DIAGNOSIS — I340 Nonrheumatic mitral (valve) insufficiency: Secondary | ICD-10-CM

## 2017-09-11 LAB — GLUCOSE, CAPILLARY
GLUCOSE-CAPILLARY: 327 mg/dL — AB (ref 70–99)
GLUCOSE-CAPILLARY: 264 mg/dL — AB (ref 70–99)
GLUCOSE-CAPILLARY: 201 mg/dL — AB (ref 70–99)
Glucose-Capillary: 201 mg/dL — ABNORMAL HIGH (ref 70–99)

## 2017-09-11 LAB — URINALYSIS, ROUTINE W REFLEX MICROSCOPIC
BILIRUBIN URINE: NEGATIVE
Glucose, UA: NEGATIVE mg/dL
Ketones, ur: NEGATIVE mg/dL
NITRITE: NEGATIVE
PH: 5 (ref 5.0–8.0)
Protein, ur: 100 mg/dL — AB
RBC / HPF: >50 RBC/hpf — ABNORMAL HIGH (ref 0–5)
SPECIFIC GRAVITY, URINE: 1.012 (ref 1.005–1.030)
WBC, UA: >50 WBC/hpf — ABNORMAL HIGH (ref 0–5)

## 2017-09-11 LAB — PROTIME-INR
INR: 5.94
PROTHROMBIN TIME: 52.7 s — AB (ref 11.4–15.2)

## 2017-09-11 LAB — CBC WITH DIFFERENTIAL/PLATELET
Abs Immature Granulocytes: 0 10*3/uL (ref 0.0–0.1)
BASOS PCT: 0 %
Basophils Absolute: 0 10*3/uL (ref 0.0–0.1)
EOS ABS: 0 10*3/uL (ref 0.0–0.7)
Eosinophils Relative: 0 %
HCT: 29.5 % — ABNORMAL LOW (ref 36.0–46.0)
Hemoglobin: 9.4 g/dL — ABNORMAL LOW (ref 12.0–15.0)
IMMATURE GRANULOCYTES: 1 %
Lymphocytes Relative: 9 %
Lymphs Abs: 0.8 10*3/uL (ref 0.7–4.0)
MCH: 27.3 pg (ref 26.0–34.0)
MCHC: 31.9 g/dL (ref 30.0–36.0)
MCV: 85.8 fL (ref 78.0–100.0)
Monocytes Absolute: 0.1 10*3/uL (ref 0.1–1.0)
Monocytes Relative: 2 %
NEUTROS PCT: 88 %
Neutro Abs: 7.9 10*3/uL — ABNORMAL HIGH (ref 1.7–7.7)
PLATELETS: 206 10*3/uL (ref 150–400)
RBC: 3.44 MIL/uL — AB (ref 3.87–5.11)
RDW: 16.3 % — ABNORMAL HIGH (ref 11.5–15.5)
WBC: 8.9 10*3/uL (ref 4.0–10.5)

## 2017-09-11 LAB — BASIC METABOLIC PANEL
Anion gap: 8 (ref 5–15)
BUN: 36 mg/dL — AB (ref 8–23)
CHLORIDE: 103 mmol/L (ref 98–111)
CO2: 25 mmol/L (ref 22–32)
CREATININE: 2.39 mg/dL — AB (ref 0.44–1.00)
Calcium: 8.5 mg/dL — ABNORMAL LOW (ref 8.9–10.3)
GFR calc Af Amer: 19 mL/min — ABNORMAL LOW (ref >60)
GFR calc non Af Amer: 16 mL/min — ABNORMAL LOW (ref >60)
Glucose, Bld: 355 mg/dL — ABNORMAL HIGH (ref 70–99)
Potassium: 4.5 mmol/L (ref 3.5–5.1)
Sodium: 136 mmol/L (ref 135–145)

## 2017-09-11 LAB — PROCALCITONIN: Procalcitonin: 1.49 ng/mL

## 2017-09-11 LAB — CREATININE, URINE, RANDOM: CREATININE, URINE: 77.02 mg/dL

## 2017-09-11 LAB — ECHOCARDIOGRAM COMPLETE
HEIGHTINCHES: 63 in
Weight: 2924.18 oz

## 2017-09-11 LAB — SODIUM, URINE, RANDOM: Sodium, Ur: 30 mmol/L

## 2017-09-11 LAB — MAGNESIUM: Magnesium: 2 mg/dL (ref 1.7–2.4)

## 2017-09-11 LAB — STREP PNEUMONIAE URINARY ANTIGEN: Strep Pneumo Urinary Antigen: NEGATIVE

## 2017-09-11 MED ORDER — GLIPIZIDE 5 MG PO TABS: ORAL_TABLET | ORAL | 1 refills | Status: AC

## 2017-09-11 MED ORDER — SODIUM CHLORIDE 0.9 % IV SOLN
500.0000 mg | Freq: Two times a day (BID) | INTRAVENOUS | Status: DC
  Administered 2017-09-11 – 2017-09-13 (×4): 500 mg via INTRAVENOUS
  Filled 2017-09-11 (×5): qty 0.5

## 2017-09-11 MED ORDER — PERFLUTREN LIPID MICROSPHERE
INTRAVENOUS | Status: AC
  Filled 2017-09-11: qty 10

## 2017-09-11 MED ORDER — SODIUM CHLORIDE 0.9 % IV SOLN
1.0000 g | Freq: Two times a day (BID) | INTRAVENOUS | Status: DC
  Administered 2017-09-11: 1 g via INTRAVENOUS
  Filled 2017-09-11: qty 1

## 2017-09-11 MED ORDER — POTASSIUM CHLORIDE CRYS ER 20 MEQ PO TBCR
40.0000 meq | EXTENDED_RELEASE_TABLET | Freq: Once | ORAL | Status: AC
  Administered 2017-09-11: 40 meq via ORAL
  Filled 2017-09-11: qty 2

## 2017-09-11 MED ORDER — FUROSEMIDE 10 MG/ML IJ SOLN: 20.0000 mg | Freq: Two times a day (BID) | INTRAMUSCULAR | Status: DC

## 2017-09-11 MED ORDER — PERFLUTREN LIPID MICROSPHERE
1.0000 mL | INTRAVENOUS | Status: AC | PRN
  Administered 2017-09-11: 2 mL via INTRAVENOUS
  Filled 2017-09-11: qty 10

## 2017-09-11 MED ORDER — FUROSEMIDE 10 MG/ML IJ SOLN
20.0000 mg | Freq: Two times a day (BID) | INTRAMUSCULAR | Status: DC
  Administered 2017-09-11 – 2017-09-12 (×2): 20 mg via INTRAVENOUS
  Filled 2017-09-11 (×2): qty 2

## 2017-09-11 NOTE — Progress Notes (Signed)
  Echocardiogram 2D Echocardiogram has been performed.  [name] 09/11/2017, 1:31 PM

## 2017-09-11 NOTE — Progress Notes (Signed)
Results for DAYANIS, BERGQUIST (MRN [id number]) as of 09/11/2017 10:26  Ref. Range 09/10/2017 21:58 09/11/2017 08:03  Glucose-Capillary Latest Ref Range: 70 - 99 mg/dL 276 (H) 327 (H)   Inpatient Diabetes Program Recommendations  AACE/ADA: New Consensus Statement on Inpatient Glycemic Control (2015)  Target Ranges:  Prepandial:   less than 140 mg/dL      Peak postprandial:   less than 180 mg/dL (1-2 hours)      Critically ill patients:  140 - 180 mg/dL   Lab Results  Component Value Date   GLUCAP 327 (H) 09/11/2017   HGBA1C 6.5 05/10/2017    Review of Glycemic Control  Diabetes history: Type 2 Outpatient Diabetes medications: Glucotrol daily, extra 1/2 tablet if blood sugars greater than 200 mg/dl. Current orders for Inpatient glycemic control: Novolog SENSITIVE TID & HS  Inpatient Diabetes Program Recommendations:    If blood sugars continue to be greater than 200 mg/dl, recommend adding low dose Lantus 10 units daily.  Titrate dosage as needed and while on steroids.  Continue Novolog SENSITIVE correction scale TID & HS.  Harvel Ricks RN BSN CDE Diabetes Coordinator Pager: [phone]  8am-5pm

## 2017-09-11 NOTE — Evaluation (Signed)
Physical Therapy Evaluation Patient Details Name: [name] MRN: [id number] DOB: 10-20-1920 Today's Date: 09/11/2017   History of Present Illness  84 year old female with history of type 2 diabetes mellitus, heart block with pacer in place, chronic anemia, chronic combined systolic and diastolic CHF, hypothyroidism and chronic kidney disease stage III, who presents to the ED on 8/26 with shortness of breath, cough, nausea and vomiting. Diagnosed with UTI with sepsis  Clinical Impression   Pt admitted with above diagnosis. Pt currently with functional limitations due to the deficits listed below (see PT Problem List). Admitted from home, has 24 hour assist at home, and was activie with HHPT; Presents with weakness, decr activity tolerance, and decr functional mobility; Family very involved and fupportive and clearly wish for Ms. Peel to dc home and restart HH therapies;  Pt will benefit from skilled PT to increase their independence and safety with mobility to allow discharge to the venue listed below.       Follow Up Recommendations Home health PT;Supervision/Assistance - 24 hour;Other (comment)(as well as HHOT/Aide/RN)    Equipment Recommendations  None recommended by PT    Recommendations for Other Services OT consult(will place order per protocol)     Precautions / Restrictions Precautions Precautions: Fall Precaution Comments: Pt is blind; hears better from R ear      Mobility  Bed Mobility Overal bed mobility: Needs Assistance Bed Mobility: Rolling;Sidelying to Sit Rolling: Mod assist Sidelying to sit: Max assist       General bed mobility comments: Max assist to help lower legs off of the bed and elevate trunk to sit  Transfers Overall transfer level: Needs assistance Equipment used: 1 person hand held assist;2 person hand held assist Transfers: Stand Pivot Transfers;Sit to/from Stand Sit to Stand: +2 physical assistance;Max assist Stand pivot transfers:  +2 physical assistance;Max assist       General transfer comment: Max assist to power up to stand (but on the lighter side of Max assist); 2 person assistfor safety with pivot to recliner; once in recliner performed 4 serial sit to stands with 2 person assist  Ambulation/Gait                Stairs            Wheelchair Mobility    Modified Rankin (Stroke Patients Only)       Balance Overall balance assessment: Needs assistance   Sitting balance-Leahy Scale: Poor(approaching Fair)       Standing balance-Leahy Scale: Zero                               Pertinent Vitals/Pain Pain Assessment: No/denies pain    Home Living Family/patient expects to be discharged to:: Private residence Living Arrangements: Children Available Help at Discharge: Family;Personal care attendant;Other (Comment);Available 24 hours/day(and sitter) Type of Home: House Home Access: Ramped entrance     Home Layout: One level Home Equipment: Wheelchair - manual;Hospital bed      Prior Function Level of Independence: Needs assistance   Gait / Transfers Assistance Needed: Mostly wheelchair transfer level of mobility  ADL's / Homemaking Assistance Needed: Aide helps with sponge bathing        Hand Dominance        Extremity/Trunk Assessment   Upper Extremity Assessment Upper Extremity Assessment: Generalized weakness;Defer to OT evaluation(noted tremor)    Lower Extremity Assessment Lower Extremity Assessment: Generalized weakness       Communication  Communication: HOH  Cognition Arousal/Alertness: Awake/alert Behavior During Therapy: WFL for tasks assessed/performed Overall Cognitive Status: History of cognitive impairments - at baseline                                        General Comments      Exercises     Assessment/Plan    PT Assessment Patient needs continued PT services  PT Problem List Decreased strength;Decreased  activity tolerance;Decreased balance;Decreased mobility;Decreased coordination;Decreased cognition;Decreased knowledge of use of DME;Decreased safety awareness;Decreased knowledge of precautions       PT Treatment Interventions DME instruction;Functional mobility training;Therapeutic activities;Therapeutic exercise;Balance training;Cognitive remediation;Patient/family education    PT Goals (Current goals can be found in the Care Plan section)  Acute Rehab PT Goals Patient Stated Goal: Agreeable to getting OOB PT Goal Formulation: With patient/family Time For Goal Achievement: 09/25/17 Potential to Achieve Goals: Good    Frequency Min 3X/week   Barriers to discharge        Co-evaluation               AM-PAC PT "6 Clicks" Daily Activity  Outcome Measure Difficulty turning over in bed (including adjusting bedclothes, sheets and blankets)?: Unable Difficulty moving from lying on back to sitting on the side of the bed? : Unable Difficulty sitting down on and standing up from a chair with arms (e.g., wheelchair, bedside commode, etc,.)?: Unable Help needed moving to and from a bed to chair (including a wheelchair)?: A Lot Help needed walking in hospital room?: Total Help needed climbing 3-5 steps with a railing? : Total 6 Click Score: 7    End of Session Equipment Utilized During Treatment: Gait belt Activity Tolerance: Patient tolerated treatment well Patient left: in chair;with call bell/phone within reach;with chair alarm set Nurse Communication: Mobility status PT Visit Diagnosis: Unsteadiness on feet (R26.81);Other abnormalities of gait and mobility (R26.89);Muscle weakness (generalized) (M62.81)    Time: 8115-7262 PT Time Calculation (min) (ACUTE ONLY): 36 min   Charges:   PT Evaluation $PT Eval Moderate Complexity: 1 Mod PT Treatments $Therapeutic Activity: 8-22 mins        Roney Marion, PT  Acute Rehabilitation Services Pager [phone] Office  [phone]   [place]letta [place] 09/11/2017, 4:52 PM

## 2017-09-11 NOTE — Progress Notes (Signed)
CRITICAL VALUE ALERT  Critical Value:  PT/INR 52.7/5.94  Date & Time Notied:  09/11/17 0615  Provider Notified: On call provider via Amion  Orders Received/Actions taken: Waiting for response from the provider

## 2017-09-11 NOTE — Progress Notes (Signed)
Patient has 400 ml output  from indwelling foley cath at this time.

## 2017-09-11 NOTE — Progress Notes (Addendum)
PROGRESS NOTE  [name] RJJ:[id number] DOB: 02-14-1920 DOA: 09/10/2017 PCP: [name], MD   LOS: 1 day   Brief Narrative / Interim history: 84 year old female with history of type 2 diabetes mellitus, heart block with pacer in place, chronic anemia, chronic combined systolic and diastolic CHF, hypothyroidism and chronic kidney disease stage III, who presents to the ED on 8/26 with shortness of breath, cough, nausea and vomiting.  This is been going on for a few days.  She also reports worsening lower extremity swelling.  Patient's daughter is at bedside and she mentions that patient has been having difficulties urinating over the last 3 to 4 days and takes her a long time to pee.  She has seen a urologist 1 or 2 weeks ago (daughter is not sure) and at that time she was given Macrobid but daughter is not sure about the details.  Assessment & Plan: Principal Problem:   Acute on chronic combined systolic and diastolic CHF (congestive heart failure) (HCC) Active Problems:   Hypokalemia   Anemia of chronic disease   Nausea & vomiting   PAF (paroxysmal atrial fibrillation) (HCC)   Acquired autoimmune hypothyroidism   Complete heart block (HCC)   Renal insufficiency   SIRS (systemic inflammatory response syndrome) ([facility])   Acute urinary retention likely in the setting of urinary tract infection with sepsis -Met sepsis criteria on admission with significantly elevated white count of 17,000, tachycardia, -Patient has been having difficulties urinating, saw urologist couple weeks ago and there were plans for INO cath but urinated on her own.  It appears that she was placed on antibiotics with Macrobid.  Urinalysis on admission with clear evidence of infection -Patient has a history of ESBL infection back in 2018 and will broaden to meropenem, discontinue ceftriaxone. -Not much urine output this morning, place a Foley catheter  Acute on chronic combined systolic and diastolic CHF -Most  recent 2D echo dates back to 2017 with an EF of 45 to 50%.  Admitting MD ordered a repeat echo, pending -She has received Lasix given fluid overload picture on admission, given that urinary retention is also an issue, unknown for how long has been going on, will monitor closely urine output following Foley placement and discontinue Lasix.  In addition, she has borderline hypotension right now.  If she has significant postobstructive diuresis may in fact need IV fluids.  Does not look overtly fluid overloaded currently and is on room air  Acute kidney injury on chronic kidney disease stage III -Creatinine was 1.2 in April, on admission 2.27.  Slightly worsening today likely due to retention.  Place Foley catheter as described above and closely monitor for postobstructive diuresis.  Type 2 diabetes mellitus -Hold glipizide, continue sliding scale  Paroxysmal A. Fib, supratherapeutic INR -She is in sinus rhythm, INR is supratherapeutic and slightly higher this morning without evidence of bleeding.  Continue to hold Coumadin  Hypokalemia -Potassium has been supplemented and now with normal  Nausea vomiting -Likely in the setting of UTI  Normocytic anemia -Likely in the setting of chronic disease, no evidence of bleeding  Hypothyroidism -Continue Synthroid   DVT prophylaxis: Coumadin Code Status: No intubation Family Communication: Daughter present at bedside Disposition Plan: To be determined  Consultants:   None   Procedures:   2D echo: pending  Antimicrobials:  Ceftriaxone / Azythromycin 8/26 >> 8/27  Meropenem 8/27 >>   Subjective: - no chest pain, shortness of breath, no abdominal pain, nausea or vomiting.  Objective: Vitals:   09/10/17 2035 09/11/17 0036 09/11/17 0553 09/11/17 0802  BP:  (!) 92/59 (!) 92/53 (!) 91/52  Pulse: (!) 109 90 77 74  Resp: '19 18 19 20  '$ Temp: 98.8 F (37.1 C) 97.6 F (36.4 C) (!) 97.3 F (36.3 C) 98.3 F (36.8 C)  TempSrc: Oral Oral  Oral Axillary  SpO2: 94% 95% 97% 97%  Weight: 80.2 kg  82.9 kg   Height: '5\' 3"'$  (1.6 m)       Intake/Output Summary (Last 24 hours) at 09/11/2017 1101 Last data filed at 09/11/2017 0319 Gross per 24 hour  Intake 200 ml  Output 325 ml  Net -125 ml   Filed Weights   09/10/17 1427 09/10/17 2035 09/11/17 0553  Weight: 93.9 kg 80.2 kg 82.9 kg    Examination:  Constitutional: NAD, sleepy this morning but answers questions without opening her eyes ENMT: Mucous membranes are moist.  Neck: normal, supple Respiratory: clear to auscultation bilaterally, no wheezing, no crackles. Normal respiratory effort. Cardiovascular: Regular rate and rhythm, no murmurs / rubs / gallops.  1+ LE edema. 2+ pedal pulses.  Abdomen: no tenderness. Bowel sounds positive.  Skin: no rashes seen Neurologic: No focal findings   Data Reviewed: I have independently reviewed following labs and imaging studies  Chest x-ray -cardiomegaly with increased pulmonary vasculature  CBC: Recent Labs  Lab 09/10/17 1529 09/11/17 0423  WBC 17.4* 8.9  NEUTROABS 14.5* 7.9*  HGB 9.1* 9.4*  HCT 29.6* 29.5*  MCV 89.4 85.8  PLT 232 559   Basic Metabolic Panel: Recent Labs  Lab 09/10/17 1529 09/11/17 0423  NA 140 136  K 3.2* 4.5  CL 105 103  CO2 25 25  GLUCOSE 201* 355*  BUN 30* 36*  CREATININE 2.27* 2.39*  CALCIUM 8.6* 8.5*  MG  --  2.0   GFR: Estimated Creatinine Clearance: 14.4 mL/min (A) (by C-G formula based on SCr of 2.39 mg/dL (H)). Liver Function Tests: Recent Labs  Lab 09/10/17 1529  AST 22  ALT 10  ALKPHOS 108  BILITOT 0.7  PROT 6.3*  ALBUMIN 2.1*   No results for input(s): LIPASE, AMYLASE in the last 168 hours. No results for input(s): AMMONIA in the last 168 hours. Coagulation Profile: Recent Labs  Lab 09/10/17 1529 09/11/17 0423  INR 4.86* 5.94*   Cardiac Enzymes: No results for input(s): CKTOTAL, CKMB, CKMBINDEX, TROPONINI in the last 168 hours. BNP (last 3 results) No results  for input(s): PROBNP in the last 8760 hours. HbA1C: No results for input(s): HGBA1C in the last 72 hours. CBG: Recent Labs  Lab 09/10/17 2158 09/11/17 0803  GLUCAP 276* 327*   Lipid Profile: No results for input(s): CHOL, HDL, LDLCALC, TRIG, CHOLHDL, LDLDIRECT in the last 72 hours. Thyroid Function Tests: No results for input(s): TSH, T4TOTAL, FREET4, T3FREE, THYROIDAB in the last 72 hours. Anemia Panel: No results for input(s): VITAMINB12, FOLATE, FERRITIN, TIBC, IRON, RETICCTPCT in the last 72 hours. Urine analysis:    Component Value Date/Time   COLORURINE YELLOW 09/11/2017 0019   APPEARANCEUR TURBID (A) 09/11/2017 0019   LABSPEC 1.012 09/11/2017 0019   PHURINE 5.0 09/11/2017 0019   GLUCOSEU NEGATIVE 09/11/2017 0019   GLUCOSEU NEGATIVE 04/01/2013 1550   HGBUR MODERATE (A) 09/11/2017 0019   BILIRUBINUR NEGATIVE 09/11/2017 0019   KETONESUR NEGATIVE 09/11/2017 0019   PROTEINUR 100 (A) 09/11/2017 0019   UROBILINOGEN 1.0 08/16/2014 1521   NITRITE NEGATIVE 09/11/2017 0019   LEUKOCYTESUR MODERATE (A) 09/11/2017 0019   Sepsis Labs:  Invalid input(s): PROCALCITONIN, LACTICIDVEN  Recent Results (from the past 240 hour(s))  Culture, blood (routine x 2)     Status: None (Preliminary result)   Collection Time: 09/10/17  3:29 PM  Result Value Ref Range Status   Specimen Description BLOOD RIGHT ANTECUBITAL  Final   Special Requests   Final    BOTTLES DRAWN AEROBIC AND ANAEROBIC Blood Culture adequate volume   Culture   Final    NO GROWTH < 24 HOURS Performed at [facility], [address] [place], [place] [address]    Report Status PENDING  Incomplete  Culture, blood (routine x 2)     Status: None (Preliminary result)   Collection Time: 09/10/17  3:29 PM  Result Value Ref Range Status   Specimen Description BLOOD RIGHT HAND  Final   Special Requests   Final    BOTTLES DRAWN AEROBIC AND ANAEROBIC Blood Culture adequate volume   Culture   Final    NO GROWTH < 24  HOURS Performed at [facility], [place] [address] [place], [place] [address]    Report Status PENDING  Incomplete      Radiology Studies: Dg Chest 2 View  Result Date: 09/10/2017 CLINICAL DATA:  Dyspnea. EXAM: CHEST - 2 VIEW COMPARISON:  Radiographs of July 06, 2017. FINDINGS: Stable cardiomegaly. Status post coronary bypass graft. Left-sided pacemaker is unchanged in position. No pneumothorax or pleural effusion is noted. Mild central pulmonary vascular congestion is noted. No consolidative process is noted. Bony thorax is unremarkable. IMPRESSION: Cardiomegaly with mild central pulmonary vascular congestion. Electronically Signed   By: Marijo Conception, M.D.   On: 09/10/2017 15:21     Scheduled Meds: . donepezil  5 mg Oral QHS  . folic acid  1 mg Oral Daily  . furosemide  20 mg Intravenous Q12H  . gabapentin  100 mg Oral BID  . insulin aspart  0-5 Units Subcutaneous QHS  . insulin aspart  0-9 Units Subcutaneous TID WC  . levothyroxine  62.5 mcg Oral QAC breakfast  . lubiprostone  24 mcg Oral BID WC  . pantoprazole  40 mg Oral BID AC  . rosuvastatin  20 mg Oral q1800  . sodium chloride flush  3 mL Intravenous Q12H   Continuous Infusions: . sodium chloride    . meropenem (MERREM) IV 1 g (09/11/17 1009)     Marzetta Board, MD, PhD [facility] Pager [phone]  If 7PM-7AM, please contact night-coverage [email or web address] Password [email or web address] 09/11/2017, 11:01 AM

## 2017-09-11 NOTE — Telephone Encounter (Signed)
Spoke with [name] and the patient is in the hospital with an infection- I advised her of MD note below and put the glipizide on her medication list with the instructions

## 2017-09-12 DIAGNOSIS — I2510 Atherosclerotic heart disease of native coronary artery without angina pectoris: Secondary | ICD-10-CM

## 2017-09-12 DIAGNOSIS — N183 Chronic kidney disease, stage 3 (moderate): Secondary | ICD-10-CM

## 2017-09-12 DIAGNOSIS — I5043 Acute on chronic combined systolic (congestive) and diastolic (congestive) heart failure: Secondary | ICD-10-CM

## 2017-09-12 LAB — PROTIME-INR
INR: 5.83 — AB
Prothrombin Time: 51.9 seconds — ABNORMAL HIGH (ref 11.4–15.2)

## 2017-09-12 LAB — BASIC METABOLIC PANEL
ANION GAP: 13 (ref 5–15)
BUN: 46 mg/dL — ABNORMAL HIGH (ref 8–23)
CHLORIDE: 106 mmol/L (ref 98–111)
CO2: 19 mmol/L — ABNORMAL LOW (ref 22–32)
Calcium: 8.7 mg/dL — ABNORMAL LOW (ref 8.9–10.3)
Creatinine, Ser: 2.36 mg/dL — ABNORMAL HIGH (ref 0.44–1.00)
GFR calc Af Amer: 19 mL/min — ABNORMAL LOW (ref >60)
GFR, EST NON AFRICAN AMERICAN: 16 mL/min — AB (ref >60)
Glucose, Bld: 195 mg/dL — ABNORMAL HIGH (ref 70–99)
POTASSIUM: 4.2 mmol/L (ref 3.5–5.1)
SODIUM: 138 mmol/L (ref 135–145)

## 2017-09-12 LAB — GLUCOSE, CAPILLARY
GLUCOSE-CAPILLARY: 101 mg/dL — AB (ref 70–99)
GLUCOSE-CAPILLARY: 177 mg/dL — AB (ref 70–99)
GLUCOSE-CAPILLARY: 179 mg/dL — AB (ref 70–99)
Glucose-Capillary: 145 mg/dL — ABNORMAL HIGH (ref 70–99)
Glucose-Capillary: 116 mg/dL — ABNORMAL HIGH (ref 70–99)

## 2017-09-12 LAB — PROCALCITONIN: PROCALCITONIN: 1.31 ng/mL

## 2017-09-12 LAB — LEGIONELLA PNEUMOPHILA SEROGP 1 UR AG: L. pneumophila Serogp 1 Ur Ag: NEGATIVE

## 2017-09-12 LAB — CBC
HCT: 28.8 % — ABNORMAL LOW (ref 36.0–46.0)
Hemoglobin: 9 g/dL — ABNORMAL LOW (ref 12.0–15.0)
MCH: 27.9 pg (ref 26.0–34.0)
MCHC: 31.3 g/dL (ref 30.0–36.0)
MCV: 89.2 fL (ref 78.0–100.0)
PLATELETS: 230 10*3/uL (ref 150–400)
RBC: 3.23 MIL/uL — AB (ref 3.87–5.11)
RDW: 17 % — ABNORMAL HIGH (ref 11.5–15.5)
WBC: 14.4 10*3/uL — AB (ref 4.0–10.5)

## 2017-09-12 LAB — UREA NITROGEN, URINE: Urea Nitrogen, Ur: 319 mg/dL

## 2017-09-12 MED ORDER — TAMSULOSIN HCL 0.4 MG PO CAPS
0.4000 mg | ORAL_CAPSULE | Freq: Every day | ORAL | Status: DC
  Administered 2017-09-12 – 2017-09-13 (×2): 0.4 mg via ORAL
  Filled 2017-09-12 (×2): qty 1

## 2017-09-12 MED ORDER — FUROSEMIDE 10 MG/ML IJ SOLN
20.0000 mg | Freq: Two times a day (BID) | INTRAMUSCULAR | Status: AC
  Administered 2017-09-12 – 2017-09-13 (×3): 20 mg via INTRAVENOUS
  Filled 2017-09-12 (×3): qty 2

## 2017-09-12 MED ORDER — TRAMADOL HCL 50 MG PO TABS
50.0000 mg | ORAL_TABLET | Freq: Two times a day (BID) | ORAL | Status: DC | PRN
  Administered 2017-09-12 – 2017-09-14 (×4): 50 mg via ORAL
  Filled 2017-09-12 (×4): qty 1

## 2017-09-12 NOTE — Consult Note (Addendum)
   [facility] CM Inpatient Consult   09/12/2017  [name] May 02, 1920 [id number]    Chart review reveals patient is listed with Dr. Kevan Ny for primary care provider in [facility]. This primary care provider is not a [facility] provider.  Confirmed with her daughter, [name] that the patient's primary care provider is Dr. Kevan Ny.  Natividad Brood, RN BSN [facility] Liaison  [phone] business mobile phone Toll free office [phone]

## 2017-09-12 NOTE — Progress Notes (Signed)
Pt. With critical INR of 5.83. MD paged to make aware.

## 2017-09-12 NOTE — Consult Note (Signed)
Cardiology Consultation:   Patient ID: [name]; [id number]; [birth year]   Admit date: 09/10/2017 Date of Consult: 09/12/2017  Primary Care Provider: [name], MD Primary Cardiologist: Sanda Klein, MD  Primary Electrophysiologist:     Patient Profile:   [name] is a 84 y.o. female with a hx of CAD s/p CABG 1994, chronic combined systolic and diastolic heart failure, paroxysmal atrial fibrillation on coumadin, HLD, DM2 with retinopathy and neuropathy, CKD stage III, and complete heart block s/p dual-chamber PPM (2008, 2017, [employer]) who is being seen today for the evaluation of CHF exacerbation at the request of Dr. Florene Glen.  History of Present Illness:   [name] was last seen in clinic with Dr. Sallyanne Kuster on 06/15/17. She is pacemaker dependent and has not had atrial fibrillation on pacemaker interrogation. She was doing well at that visit. She had a small NSTEMI 07/2016 that was treated conservatively in the setting of advanced age and renal insufficiency. She also has a history of GI bleed, no bleeding recently.   She presented to [facility] with difficulty breathing, emesis, and green mucus. She has had wheezing for 2 days and productive cough for 1 day. On arrival, she was satting in the mid 90s on room air with bilateral wheezing, treated with albuterol and solu-medrol. She was hypokalemic at 3.2, acute on chronic kidney injury with creatinine 4.86, and a WBC 17.4. INR was 4.86 and BNP was 264. CXR with cardiomegaly and pulmonary vascular congestion. It was suspected that she had early CAP and congestive heart failure exacerbation. She was admitted and started on IV ABX and cardiology was consulted to help manage her presumed heart failure exacerbation.   She has received 20 mg IV lasix BID; however, she has suffered from urinary retention relieved with foley   Daughter indicates she does very little at home. Poor hearing and legally blind. Only gets from bed to  wheel chair and rarely Goes out except to church   Currently comfortable sitting in chair    Past Medical History:  Diagnosis Date  . 2nd degree atrioventricular block    a. 06/22/15 Gen change: MDT ADDR01 Adapta, DC PPM (ser# [id number]).  . Allergy to perfume   . Anemia   . Arthritis   . Chronic combined systolic and diastolic CHF (congestive heart failure) ([place])    a. 01/2014 Echo: EF @ least mod-sev reduced with HK of lat/apical, basal inf walls. basalpost AK, Gr 1DD; b. 06/2015 Echo: EF 45-50%, Gr2 DD, mod LVH.   . CKD (chronic kidney disease), stage III ([place])    a. iii - iv.  . Coronary artery disease    a. 1994 s/p cabg;  b. 09/2013 MV: large, sev intensity, partially reversible inf, apical defect, prior inf/ap infarct w/ mild peri-infarct ischemia->Med Rx; c. 06/2015 NSTEMI (trop 6)->Med rx.  . Daily headache   . DVT of upper extremity (deep vein thrombosis) ([place]) 06/13/2012   BUE  . Dyspepsia   . Gastric ulcer   . GERD (gastroesophageal reflux disease)   . H/O: GI bleed 12//13  . Hiatal hernia   . High cholesterol   . History of blood transfusion 2013  . Hypertensive heart disease   . Hypothyroid   . Legally blind   . PAF (paroxysmal atrial fibrillation) ([facility])    a. CHA2DS2VASc = 7-->coumadin.  . Presence of permanent cardiac pacemaker    a. 06/22/15 Gen change: MDT ADDR01 Adapta, DC PPM (ser# [id number]).  . Seasonal allergies   .  Type II diabetes mellitus ([place])   . UTI (lower urinary tract infection)     Past Surgical History:  Procedure Laterality Date  . CARDIAC CATHETERIZATION  10/25/92  . CARDIAC CATHETERIZATION  11/18/03   w/grafts 100%CX LAD 80 & 100%  . CARDIAC CATHETERIZATION  01/24/05   diffuse disease of native vessels  . CARDIAC CATHETERIZATION  06/06/06   severe native CAD  . CARDIOVERSION  06/06/06   successful  . CATARACT EXTRACTION W/ INTRAOCULAR LENS  IMPLANT, BILATERAL Bilateral   . CORONARY ARTERY BYPASS GRAFT  10/27/92   LIMA to LAD,SVG to LAD  second diagonal,obtuse maraginal of the CX and posterior descendingbranch of the RCA  . EP IMPLANTABLE DEVICE N/A 06/22/2015   Procedure: PPM/BIV PPM Generator Changeout;  Surgeon: Sanda Klein, MD;  Location: [facility];  Service: Cardiovascular;  Laterality: N/A;  . ESOPHAGOGASTRODUODENOSCOPY  12/22/2011   Procedure: ESOPHAGOGASTRODUODENOSCOPY (EGD);  Surgeon: Gatha Mayer, MD;  Location: [facility];  Service: Endoscopy;  Laterality: N/A;  . INSERT / REPLACE / REMOVE PACEMAKER  06/29/2006   Medtronic adapta  . REFRACTIVE SURGERY Bilateral      Home Medications:  Prior to Admission medications   Medication Sig Start Date End Date Taking? Authorizing Provider  acetaminophen (TYLENOL) 500 MG tablet Take 500 mg by mouth every 6 (six) hours as needed (pain).   Yes [provider]  albuterol (PROAIR HFA) 108 (90 Base) MCG/ACT inhaler Inhale 2 puffs into the lungs every 6 (six) hours as needed for wheezing or shortness of breath.   Yes [provider]  AMITIZA 24 MCG capsule Take 1 capsule (24 mcg total) by mouth 2 (two) times daily with a meal. 06/04/17  Yes Nandigam, Kavitha V, MD  atropine 1 % ophthalmic solution Place 1 drop into both eyes daily as needed (burning eyes).  10/29/13  Yes [provider]  donepezil (ARICEPT) 5 MG tablet Take 5 mg by mouth at bedtime.  07/13/14  Yes [provider]  folic acid (FOLVITE) 1 MG tablet TAKE 1 TABLET BY MOUTH EVERY DAY Patient taking differently: Take 1 mg by mouth daily.  09/10/17  Yes Croitoru, Mihai, MD  furosemide (LASIX) 20 MG tablet Take 1 tablet (20 mg total) by mouth daily. 06/16/17  Yes Croitoru, Mihai, MD  gabapentin (NEURONTIN) 100 MG capsule Take 100 mg by mouth 2 (two) times daily.    Yes [provider]  guaifenesin (MUCUS RELIEF CHEST CONGESTION) 400 MG TABS tablet Take 400 mg by mouth 2 (two) times daily as needed (cough/ congestion).   Yes [provider]  ketoconazole (NIZORAL)  2 % cream Apply 1 application topically as needed for irritation.  03/21/17  Yes [provider]  levalbuterol Penne Lash) 0.63 MG/3ML nebulizer solution Take 3 mLs by nebulization 3 (three) times daily as needed for wheezing or shortness of breath.  09/06/16  Yes [provider]  loratadine (CLARITIN) 10 MG tablet Take 10 mg by mouth daily.   Yes [provider]  magnesium citrate SOLN Take 1 Bottle by mouth See admin instructions. Every 7 days as needed for bowel movement.   Yes [provider]  meclizine (ANTIVERT) 25 MG tablet Take 25 mg by mouth 3 (three) times daily as needed for dizziness.   Yes [provider]  NUTRITIONAL SUPPLEMENTS PO Enterex Diabetic Nutritional Beverage:   Yes [provider]  ofloxacin (OCUFLOX) 0.3 % ophthalmic solution Place 1 drop into both eyes 4 (four) times daily as needed (  for drainage).  02/17/16  Yes [provider]  omeprazole (PRILOSEC) 40 MG capsule TAKE 1 CAPSULE BY MOUTH TWICE A DAY Patient taking differently: Take 40 mg by mouth 2 (two) times daily.  09/10/17  Yes Nandigam, Venia Minks, MD  OVER THE COUNTER MEDICATION Kidney supplement capsules (Choline 100 mg, trimethylglycine 50 mg, glutathione 50 mg, methyltetrahydrofolate, l-arginine-, l-ornithine, l-citruline): Take 1 capsule by mouth two to three times a week   Yes [provider]  Polyvinyl Alcohol-Povidone (REFRESH OP) Place 1 drop into both eyes daily as needed (dry eyes).    Yes [provider]  Potassium Chloride CR (MICRO-K) 8 MEQ CPCR capsule CR Take 1 capsule on Mondays and Thursdays, the days you take your Lasix. Patient taking differently: Take 8 mEq by mouth daily.  04/19/17  Yes Croitoru, Mihai, MD  rosuvastatin (CRESTOR) 20 MG tablet Take 1 tablet (20 mg total) by mouth daily. 03/26/17  Yes Croitoru, Mihai, MD  SYNTHROID 125 MCG tablet TAKE 1/2 TABLET BY MOUTH EVERY DAY Patient taking differently: Take 62.5 mcg by mouth  daily before breakfast.  06/12/17  Yes Elayne Snare, MD  traMADol (ULTRAM) 50 MG tablet Take 1 tablet (50 mg total) by mouth 2 (two) times daily as needed for moderate pain. 09/11/16  Yes Hongalgi, Lenis Dickinson, MD  triamcinolone cream (KENALOG) 0.1 % Apply 1 application topically 2 (two) times daily. Flare up 03/21/17  Yes [provider]  warfarin (COUMADIN) 3 MG tablet TAKE 1 TABLET TO 1 AND 1/2 TABLETS EVERY DAY AS DIRECTED BY COUMADIN CLINIC. 08/18/17  Yes Nila Nephew, MD  zolpidem (AMBIEN) 10 MG tablet Take 5-10 mg by mouth at bedtime as needed for sleep.  08/08/16  Yes [provider]  ACCU-CHEK FASTCLIX LANCETS MISC Use to check sugar 2 times daily 07/17/17   Elayne Snare, MD  Blood Glucose Monitoring Suppl (ACCU-CHEK AVIVA PLUS) w/Device KIT Use to check sugars 2 times daily. 06/21/16   Elayne Snare, MD  glipiZIDE (GLUCOTROL) 5 MG tablet Take 1/2 tablet when blood sugar goes over 200 Patient taking differently: 2.5 mg. Take 1/2 tablet when blood sugar goes over 200 09/11/17   Elayne Snare, MD  [facility] VERIO test strip USE TO TEST BLOOD SUGAR TWICE A DAY Patient taking differently: 1 each by Other route 2 (two) times daily.  07/10/17   Elayne Snare, MD    Inpatient Medications: Scheduled Meds: . donepezil  5 mg Oral QHS  . folic acid  1 mg Oral Daily  . furosemide  20 mg Intravenous BID  . gabapentin  100 mg Oral BID  . insulin aspart  0-5 Units Subcutaneous QHS  . insulin aspart  0-9 Units Subcutaneous TID WC  . levothyroxine  62.5 mcg Oral QAC breakfast  . lubiprostone  24 mcg Oral BID WC  . pantoprazole  40 mg Oral BID AC  . rosuvastatin  20 mg Oral q1800  . sodium chloride flush  3 mL Intravenous Q12H  . tamsulosin  0.4 mg Oral QPC supper   Continuous Infusions: . sodium chloride    . meropenem (MERREM) IV 500 mg (09/12/17 0940)   PRN Meds: sodium chloride, acetaminophen, albuterol, atropine, fentaNYL (SUBLIMAZE) injection, guaiFENesin, meclizine, ofloxacin, ondansetron  (ZOFRAN) IV, sodium chloride flush, zolpidem  Allergies:    Allergies  Allergen Reactions  . Macrobid [Nitrofurantoin] Rash, Other (See Comments) and Cough    Wheezing (also) THIS REACTION RESULTED IN THE PATIENT ENDING UP IN THE E.D.  . Darvon Other (See Comments)  Causes confusion  . Digoxin And Related Nausea Only  . Penicillins Other (See Comments)    From childhood: Has patient had a PCN reaction causing immediate rash, facial/tongue/throat swelling, SOB or lightheadedness with hypotension: Unknown Has patient had a PCN reaction causing severe rash involving mucus membranes or skin necrosis: Unknown Has patient had a PCN reaction that required hospitalization: Unknown Has patient had a PCN reaction occurring within the last 10 years: No If all of the above answers are "NO", then may proceed with Cephalosporin use.   Marland Kitchen Percocet [Oxycodone-Acetaminophen] Other (See Comments)    Causes confusion  . Percodan [Oxycodone-Aspirin] Other (See Comments)    Causes confusion  . Vicodin [Hydrocodone-Acetaminophen] Other (See Comments)    Causes confusion    Social History:   Social History   Socioeconomic History  . Marital status: Widowed    Spouse name: Not on file  . Number of children: 10  . Years of education: Not on file  . Highest education level: Not on file  Occupational History    Employer: RETIRED  Social Needs  . Financial resource strain: Not on file  . Food insecurity:    Worry: Not on file    Inability: Not on file  . Transportation needs:    Medical: Not on file    Non-medical: Not on file  Tobacco Use  . Smoking status: Never Smoker  . Smokeless tobacco: Never Used  Substance and Sexual Activity  . Alcohol use: No    Alcohol/week: 0.0 standard drinks  . Drug use: No  . Sexual activity: Never  Lifestyle  . Physical activity:    Days per week: Not on file    Minutes per session: Not on file  . Stress: Not on file  Relationships  . Social  connections:    Talks on phone: Not on file    Gets together: Not on file    Attends religious service: Not on file    Active member of club or organization: Not on file    Attends meetings of clubs or organizations: Not on file    Relationship status: Not on file  . Intimate partner violence:    Fear of current or ex partner: Not on file    Emotionally abused: Not on file    Physically abused: Not on file    Forced sexual activity: Not on file  Other Topics Concern  . Not on file  Social History Narrative  . Not on file    Family History:    Family History  Problem Relation Age of Onset  . Heart disease Father   . Diabetes Father   . Breast cancer Daughter   . Diabetes Son   . Diabetes Daughter   . Colon polyps Daughter   . Heart attack Brother   . Diabetes Brother   . Stroke Sister   . Colon cancer Neg Hx      ROS:  Please see the history of present illness.   All other ROS reviewed and negative.     Physical Exam/Data:   Vitals:   09/11/17 1710 09/11/17 2008 09/12/17 0651 09/12/17 0904  BP: (!) 88/45 (!) 94/51 (!) 105/51 (!) 101/59  Pulse: 81 87 90 77  Resp: '20 18 18 18  '$ Temp: (!) 97.4 F (36.3 C) 98.1 F (36.7 C) 98 F (36.7 C)   TempSrc: Oral Oral Oral   SpO2: 100% 100% 98% 100%  Weight:   82.4 kg   Height:  Intake/Output Summary (Last 24 hours) at 09/12/2017 1226 Last data filed at 09/12/2017 0654 Gross per 24 hour  Intake 620 ml  Output 902 ml  Net -282 ml   Filed Weights   09/10/17 2035 09/11/17 0553 09/12/17 0651  Weight: 80.2 kg 82.9 kg 82.4 kg   Body mass index is 32.18 kg/m.  General:  Elderly edentulous black female  HEENT: Poor hearing and legally blind  Lymph: no adenopathy Neck: no JVD Endocrine:  No thryomegaly Vascular: No carotid bruits; FA pulses 2+ bilaterally without bruits  Cardiac:  normal S1, S2; SEM  PPM under left clavicle  Lungs:  Basilar crackles  Abd: soft, nontender, no hepatomegaly  Ext: Plus one to two  bilateral edema  Musculoskeletal:  No deformities, BUE and BLE strength normal and equal Skin: warm and dry  Neuro:  CNs 2-12 intact, no focal abnormalities noted Psych:  Normal affect   EKG:  A sense V pacing no changes  Telemetry:  Telemetry was personally reviewed and demonstrates:  V pacing   Relevant CV Studies:  Echo 09/11/17 Study Conclusions - Left ventricle: The cavity size was normal. Wall thickness was   increased in a pattern of mild LVH. Severe anterior and   anterolateral hypokinesis. Apical septal akinesis, apical   inferior akinesis, akinesis of the true apex. Basal-mid   inferolateral akinesis. Systolic function was severely reduced.   The estimated ejection fraction was in the range of 25% to 30%.   Doppler parameters are consistent with abnormal left ventricular   relaxation (grade 1 diastolic dysfunction). - Aortic valve: Trileaflet; moderately calcified leaflets. There   was no stenosis. There was trivial regurgitation. - Mitral valve: There was mild regurgitation. - Left atrium: The atrium was mildly dilated. - Right ventricle: The cavity size was normal. Systolic function   was mildly reduced. - Tricuspid valve: Peak RV-RA gradient (S): 25 mm Hg. - Pulmonary arteries: PA peak pressure: 28 mm Hg (S). - Inferior vena cava: The vessel was normal in size. The   respirophasic diameter changes were in the normal range (>= 50%),   consistent with normal central venous pressure.  Impressions: - Normal LV size with mild LV hypertrophy. Multiple wall motion   abnormalities as described above, EF 25-30%. Normal RV size with   mildly decreased systolic function. Mild mitral regurgitation.   Laboratory Data:  Chemistry Recent Labs  Lab 09/10/17 1529 09/11/17 0423 09/12/17 0431  NA 140 136 138  K 3.2* 4.5 4.2  CL 105 103 106  CO2 25 25 19*  GLUCOSE 201* 355* 195*  BUN 30* 36* 46*  CREATININE 2.27* 2.39* 2.36*  CALCIUM 8.6* 8.5* 8.7*  GFRNONAA 17* 16*  16*  GFRAA 20* 19* 19*  ANIONGAP '10 8 13    '$ Recent Labs  Lab 09/10/17 1529  PROT 6.3*  ALBUMIN 2.1*  AST 22  ALT 10  ALKPHOS 108  BILITOT 0.7   Hematology Recent Labs  Lab 09/10/17 1529 09/11/17 0423 09/12/17 0431  WBC 17.4* 8.9 14.4*  RBC 3.31* 3.44* 3.23*  HGB 9.1* 9.4* 9.0*  HCT 29.6* 29.5* 28.8*  MCV 89.4 85.8 89.2  MCH 27.5 27.3 27.9  MCHC 30.7 31.9 31.3  RDW 16.4* 16.3* 17.0*  PLT 232 206 230   Cardiac EnzymesNo results for input(s): TROPONINI in the last 168 hours.  Recent Labs  Lab 09/10/17 1508  TROPIPOC 0.01    BNP Recent Labs  Lab 09/10/17 1531  BNP 264.4*    DDimer No results for  input(s): DDIMER in the last 168 hours.  Radiology/Studies:  Dg Chest 2 View  Result Date: 09/10/2017 CLINICAL DATA:  Dyspnea. EXAM: CHEST - 2 VIEW COMPARISON:  Radiographs of July 06, 2017. FINDINGS: Stable cardiomegaly. Status post coronary bypass graft. Left-sided pacemaker is unchanged in position. No pneumothorax or pleural effusion is noted. Mild central pulmonary vascular congestion is noted. No consolidative process is noted. Bony thorax is unremarkable. IMPRESSION: Cardiomegaly with mild central pulmonary vascular congestion. Electronically Signed   By: Marijo Conception, M.D.   On: 09/10/2017 15:21    Assessment and Plan:   1. Acute on chronic systolic and diastolic heart failure - echo this admission with LVEF 25-30% and grade 1 DD - dry weight at last two clinic visits: 169 lbs (04/12/17), 176 lbs (06/15/17) - weight on admission was 181 lbs - she is overall net negative 400 cc with 900 cc urine output yesterday - she is diuresing on 20 mg IV lasix BID   2. Coronary artery disease s/p CABG - troponin 0.06 --> 0.04 --> 0.04 - trend is mild and flat, in the setting of AonCKD - low suspicion for an ACS process, would treat conservatively   3. Hypokalemia - resolved - K is 4.2 today   4. Acute on chronic kidney disease stage III, urinary retention with  urinary tract infection with sepsis - creatinine 2.36 (2.39) - per primary   5. Elevated INR, PAF - INR 5.83 - continue holding coumadin - no indication for heparin at this time  6. PPM:  Followed by Dr Sallyanne Kuster normal function she is pacer dependent with ECG on admission showing appropriate A sensing and V pacing   There is not much for cardiology to add here. Rx possible URI with antibiotics given cough sputum and elevated WBC Diuresis a bit and change to PO diuretics in 48 hours or so depending on Cr. Foley for urinary retension  Chart reflects partial code with no intubation but in discussing with daughter suspect full DNR/ No Code is more appropriate   For questions or updates, please contact [place] Please consult [email or web address] for contact info under Cardiology/STEMI.   Signed, Jenkins Rouge, MD  09/12/2017 12:26 PM

## 2017-09-12 NOTE — Care Management Note (Addendum)
Case Management Note  Patient Details  Name: [name] MRN: [id number] Date of Birth: 1920/05/15  Subjective/Objective:     CHF              Action/Plan: Patient lives at home with her daughter [name]; PCP: [name], MD; has private insurance with [employer] with prescription drug coverage; pharmacy [facility] choice [facility] [address]; daughter reports no problem getting her medication; DME - hospital bed, wheelchair; she is active with [facility] as prior to admission; the [facility] agency called and they are aware of admission; their fax # [phone]; (orders will have to be faxed to [facility] for resumption of services). CM will continue to follow for progression of care. Daughter is requesting an air mattress for discharge. Mindi Slicker RN,MHA,BSN  11:52 am- TCT Dan with [facility], if patient does not have a pressure sore, she will not qualify for an air mattress or gel overlay mattress; information given to daughter. Mindi Slicker [facility]  Expected Discharge Date:  09/12/17               Expected Discharge Plan:  [place]  Discharge planning Services  CM Consult Choice offered to:  Adult Children  HH Arranged:  RN, Disease Management, PT, OT, Nurse's Aide HH Agency:  [facility]  Status of Service:  In process, will continue to follow  Sherrilyn Rist [phone] 09/12/2017, 11:38 AM

## 2017-09-12 NOTE — Progress Notes (Signed)
Tramadol ordered for pain as needed.  Maysel Mccolm, RN

## 2017-09-12 NOTE — Progress Notes (Signed)
PROGRESS NOTE    [name]  PIR:[id number] DOB: 1920-09-28 DOA: 09/10/2017 PCP: [name], MD   Brief Narrative:  84 year old female with history of type 2 diabetes mellitus, heart block with pacer in place, chronic anemia, chronic combined systolic and diastolic CHF, hypothyroidism and chronic kidney disease stage III, who presents to the ED on 8/26 with shortness of breath, cough, nausea and vomiting.  This is been going on for a few days.  She also reports worsening lower extremity swelling.  Patient's daughter is at bedside and she mentions that patient has been having difficulties urinating over the last 3 to 4 days and takes her a long time to pee.  She has seen a urologist 1 or 2 weeks ago (daughter is not sure) and at that time she was given Macrobid but daughter is not sure about the details.   Assessment & Plan:   Principal Problem:   Acute on chronic combined systolic and diastolic CHF (congestive heart failure) (HCC) Active Problems:   Hypokalemia   Anemia of chronic disease   Nausea & vomiting   PAF (paroxysmal atrial fibrillation) (HCC)   Acquired autoimmune hypothyroidism   Complete heart block (HCC)   Renal insufficiency   SIRS (systemic inflammatory response syndrome) ([place])   Acute urinary retention likely in the setting of urinary tract infection with sepsis - Met sepsis criteria on admission with significantly elevated white count of 17,000, tachycardia - Patient had been having difficulty urinating, started on macrobid for UTI (plans at that time for INO cath per previous provider, but pt able to void on her own) - UA on admission with squams, but + LE and WBC's suggestive of infection - Urine cx with >100,000 unidentified organisms - Meropenem given hx of ESBL infection - Started on flomax, consider trial without foley prior to d/c  Acute on chronic combined systolic and diastolic CHF -Most recent 2D echo dates back to 2017 with an EF of 45 to  50% - Repeat echo on this admission with EF 25-30% (see report) - cardiology was consulted, appreciate recs - continue diuresis with lasix 20 IV BID  Wt Readings from Last 3 Encounters:  09/12/17 82.4 kg  07/13/17 81.6 kg  06/15/17 79.8 kg    Acute kidney injury on chronic kidney disease stage III -Creatinine was 1.2 in April, on admission 2.27.  Creatinine peaked to 2.39 yesterday.  Relatively stable today.  Possibly related to urinary retention with some obstruction vs volume overload. - Foley in place - Follow with continued diuresis - Consider renal US if not improving  Type 2 diabetes mellitus -Hold glipizide, continue sliding scale.  BG reasonable.   Paroxysmal A. Fib, supratherapeutic INR -She is in sinus rhythm, INR is supratherapeutic this AM again. - hold coumadin  Hypokalemia -follow  Nausea vomiting -seems to be resolved  Normocytic anemia -stable, follow  Leukocytosis: 2/2 above, follow  Hypothyroidism -Continue Synthroid  Goals of Care:  Currently partial code.  Per Dr. Johnsie Cancel note, suggested DNR.  Discussed with pt daughter who noted she needed to discuss with her family first.  Stated they they'd discussed this before in the past and wanted to get together with family to talk about it again.  Discussed palliative care c/s, which she was agreeable to.    DVT prophylaxis: warfarin Code Status: partial  Family Communication: daughter at bedside Disposition Plan: pending improvement   Consultants:   cardiology  Procedures:  Study Conclusions  - Left ventricle: The cavity size  was normal. Wall thickness was   increased in a pattern of mild LVH. Severe anterior and   anterolateral hypokinesis. Apical septal akinesis, apical   inferior akinesis, akinesis of the true apex. Basal-mid   inferolateral akinesis. Systolic function was severely reduced.   The estimated ejection fraction was in the range of 25% to 30%.   Doppler parameters are  consistent with abnormal left ventricular   relaxation (grade 1 diastolic dysfunction). - Aortic valve: Trileaflet; moderately calcified leaflets. There   was no stenosis. There was trivial regurgitation. - Mitral valve: There was mild regurgitation. - Left atrium: The atrium was mildly dilated. - Right ventricle: The cavity size was normal. Systolic function   was mildly reduced. - Tricuspid valve: Peak RV-RA gradient (S): 25 mm Hg. - Pulmonary arteries: PA peak pressure: 28 mm Hg (S). - Inferior vena cava: The vessel was normal in size. The   respirophasic diameter changes were in the normal range (>= 50%),   consistent with normal central venous pressure.  Impressions:  - Normal LV size with mild LV hypertrophy. Multiple wall motion   abnormalities as described above, EF 25-30%. Normal RV size with   mildly decreased systolic function. Mild mitral regurgitation.   Antimicrobials:  Anti-infectives (From admission, onward)   Start     Dose/Rate Route Frequency Ordered Stop   09/11/17 2200  meropenem (MERREM) 500 mg in sodium chloride 0.9 % 100 mL IVPB     500 mg 200 mL/hr over 30 Minutes Intravenous Every 12 hours 09/11/17 1140     09/11/17 1700  cefTRIAXone (ROCEPHIN) 1 g in sodium chloride 0.9 % 100 mL IVPB  Status:  Discontinued     1 g 200 mL/hr over 30 Minutes Intravenous Every 24 hours 09/10/17 1957 09/11/17 0754   09/11/17 1700  azithromycin (ZITHROMAX) 500 mg in sodium chloride 0.9 % 250 mL IVPB  Status:  Discontinued     500 mg 250 mL/hr over 60 Minutes Intravenous Every 24 hours 09/10/17 1957 09/11/17 0754   09/11/17 1000  meropenem (MERREM) 1 g in sodium chloride 0.9 % 100 mL IVPB  Status:  Discontinued     1 g 200 mL/hr over 30 Minutes Intravenous Every 12 hours 09/11/17 0855 09/11/17 1140   09/10/17 1715  cefTRIAXone (ROCEPHIN) 1 g in sodium chloride 0.9 % 100 mL IVPB     1 g 200 mL/hr over 30 Minutes Intravenous  Once 09/10/17 1709 09/10/17 1749   09/10/17  1715  doxycycline (VIBRA-TABS) tablet 100 mg     100 mg Oral  Once 09/10/17 1710 09/10/17 1718     Subjective: Denies complaints. Daughter notes pt was "wheezing" prior to presentation.  She brought her in because of this.   Objective: Vitals:   09/11/17 1710 09/11/17 2008 09/12/17 0651 09/12/17 0904  BP: (!) 88/45 (!) 94/51 (!) 105/51 (!) 101/59  Pulse: 81 87 90 77  Resp: '20 18 18 18  '$ Temp: (!) 97.4 F (36.3 C) 98.1 F (36.7 C) 98 F (36.7 C)   TempSrc: Oral Oral Oral   SpO2: 100% 100% 98% 100%  Weight:   82.4 kg   Height:        Intake/Output Summary (Last 24 hours) at 09/12/2017 1358 Last data filed at 09/12/2017 0654 Gross per 24 hour  Intake 500 ml  Output 902 ml  Net -402 ml   Filed Weights   09/10/17 2035 09/11/17 0553 09/12/17 0651  Weight: 80.2 kg 82.9 kg 82.4 kg  Examination:  General exam: Appears calm and comfortable  Respiratory system: bibasilar crackles.  No increased WOB. Cardiovascular system: S1 & S2 heard, RRR.  Gastrointestinal system: Abdomen is nondistended, soft and nontender.  Central nervous system: Alert and oriented. No focal neurological deficits. Extremities: Bilateral LEE Skin: No rashes, lesions or ulcers Psychiatry: Judgement and insight appear normal. Mood & affect appropriate.     Data Reviewed: I have personally reviewed following labs and imaging studies  CBC: Recent Labs  Lab 09/10/17 1529 09/11/17 0423 09/12/17 0431  WBC 17.4* 8.9 14.4*  NEUTROABS 14.5* 7.9*  --   HGB 9.1* 9.4* 9.0*  HCT 29.6* 29.5* 28.8*  MCV 89.4 85.8 89.2  PLT 232 206 865   Basic Metabolic Panel: Recent Labs  Lab 09/10/17 1529 09/11/17 0423 09/12/17 0431  NA 140 136 138  K 3.2* 4.5 4.2  CL 105 103 106  CO2 25 25 19*  GLUCOSE 201* 355* 195*  BUN 30* 36* 46*  CREATININE 2.27* 2.39* 2.36*  CALCIUM 8.6* 8.5* 8.7*  MG  --  2.0  --    GFR: Estimated Creatinine Clearance: 14.5 mL/min (A) (by C-G formula based on SCr of 2.36 mg/dL  (H)). Liver Function Tests: Recent Labs  Lab 09/10/17 1529  AST 22  ALT 10  ALKPHOS 108  BILITOT 0.7  PROT 6.3*  ALBUMIN 2.1*   No results for input(s): LIPASE, AMYLASE in the last 168 hours. No results for input(s): AMMONIA in the last 168 hours. Coagulation Profile: Recent Labs  Lab 09/10/17 1529 09/11/17 0423 09/12/17 0431  INR 4.86* 5.94* 5.83*   Cardiac Enzymes: No results for input(s): CKTOTAL, CKMB, CKMBINDEX, TROPONINI in the last 168 hours. BNP (last 3 results) No results for input(s): PROBNP in the last 8760 hours. HbA1C: No results for input(s): HGBA1C in the last 72 hours. CBG: Recent Labs  Lab 09/11/17 1629 09/11/17 2147 09/12/17 0004 09/12/17 0805 09/12/17 1253  GLUCAP 201* 201* 177* 179* 145*   Lipid Profile: No results for input(s): CHOL, HDL, LDLCALC, TRIG, CHOLHDL, LDLDIRECT in the last 72 hours. Thyroid Function Tests: No results for input(s): TSH, T4TOTAL, FREET4, T3FREE, THYROIDAB in the last 72 hours. Anemia Panel: No results for input(s): VITAMINB12, FOLATE, FERRITIN, TIBC, IRON, RETICCTPCT in the last 72 hours. Sepsis Labs: Recent Labs  Lab 09/10/17 1529 09/11/17 0423 09/12/17 0431  PROCALCITON 1.43 1.49 1.31    Recent Results (from the past 240 hour(s))  Culture, blood (routine x 2)     Status: None (Preliminary result)   Collection Time: 09/10/17  3:29 PM  Result Value Ref Range Status   Specimen Description BLOOD RIGHT ANTECUBITAL  Final   Special Requests   Final    BOTTLES DRAWN AEROBIC AND ANAEROBIC Blood Culture adequate volume   Culture   Final    NO GROWTH 2 DAYS Performed at [facility], [place] [address], [place], [place] [address]    Report Status PENDING  Incomplete  Culture, blood (routine x 2)     Status: None (Preliminary result)   Collection Time: 09/10/17  3:29 PM  Result Value Ref Range Status   Specimen Description BLOOD RIGHT HAND  Final   Special Requests   Final    BOTTLES DRAWN AEROBIC AND  ANAEROBIC Blood Culture adequate volume   Culture   Final    NO GROWTH 2 DAYS Performed at [facility], [place] [address], [place], [place] [address]    Report Status PENDING  Incomplete  Urine  culture     Status: Abnormal (Preliminary result)   Collection Time: 09/11/17  4:08 AM  Result Value Ref Range Status   Specimen Description URINE, CATHETERIZED  Final   Special Requests NONE  Final   Culture (A)  Final    >=100,000 COLONIES/mL UNIDENTIFIED ORGANISM CULTURE REINCUBATED FOR BETTER GROWTH Performed at [facility], [place] [address] [place], [place] [address]    Report Status PENDING  Incomplete         Radiology Studies: Dg Chest 2 View  Result Date: 09/10/2017 CLINICAL DATA:  Dyspnea. EXAM: CHEST - 2 VIEW COMPARISON:  Radiographs of July 06, 2017. FINDINGS: Stable cardiomegaly. Status post coronary bypass graft. Left-sided pacemaker is unchanged in position. No pneumothorax or pleural effusion is noted. Mild central pulmonary vascular congestion is noted. No consolidative process is noted. Bony thorax is unremarkable. IMPRESSION: Cardiomegaly with mild central pulmonary vascular congestion. Electronically Signed   By: Marijo Conception, M.D.   On: 09/10/2017 15:21        Scheduled Meds: . donepezil  5 mg Oral QHS  . folic acid  1 mg Oral Daily  . furosemide  20 mg Intravenous BID  . gabapentin  100 mg Oral BID  . insulin aspart  0-5 Units Subcutaneous QHS  . insulin aspart  0-9 Units Subcutaneous TID WC  . levothyroxine  62.5 mcg Oral QAC breakfast  . lubiprostone  24 mcg Oral BID WC  . pantoprazole  40 mg Oral BID AC  . rosuvastatin  20 mg Oral q1800  . sodium chloride flush  3 mL Intravenous Q12H  . tamsulosin  0.4 mg Oral QPC supper   Continuous Infusions: . sodium chloride    . meropenem (MERREM) IV 500 mg (09/12/17 0940)     LOS: 2 days    Time spent: over 30 min MDM moderate with multiple medical issues    Fayrene Helper, MD [facility] Pager [phone]   If 7PM-7AM, please contact night-coverage [email or web address] Password [email or web address] 09/12/2017, 1:58 PM

## 2017-09-12 NOTE — Plan of Care (Signed)
  Problem: Elimination: Goal: Will not experience complications related to bowel motility Outcome: Progressing   Problem: Safety: Goal: Ability to remain free from injury will improve Outcome: Progressing   

## 2017-09-12 NOTE — Plan of Care (Signed)
  Problem: Safety: Goal: Ability to remain free from injury will improve Outcome: Progressing   

## 2017-09-12 NOTE — Progress Notes (Signed)
Verbal order given for mattress replacement for prevention of pressure sores per MD Florene Glen

## 2017-09-12 NOTE — Progress Notes (Signed)
Daughter is requesting Tramadol for pain for patient as needed instead of Tylenol. Daughter states patient takes this at home. Paged MD Florene Glen, awaiting on possible orders.  Artavia Jeanlouis, RN

## 2017-09-12 NOTE — Evaluation (Signed)
Occupational Therapy Evaluation Patient Details Name: [name] MRN: [id number] DOB: 04/14/1920 Today's Date: 09/12/2017    History of Present Illness 84 year old female with history of type 2 diabetes mellitus, heart block with pacer in place, chronic anemia, chronic combined systolic and diastolic CHF, hypothyroidism and chronic kidney disease stage III, who presents to the ED on 8/26 with shortness of breath, cough, nausea and vomiting. Diagnosed with UTI with sepsis   Clinical Impression   PTA, pt was living with her daughter who assisted pt with dressing, bathing, and toileting at bed level. Pt family also performing stand pivot transfers at home from bed to w/c. Pt currently requiring Max A-Total A for dressing, bathing, and toileting and required Max A +2 for functional transfers. Pt agreeable to therapy and transitioning to OOB. Pt would benefit from further acute OT to facilitate safe dc. Recommend dc to home with HHOT for further OT to optimize safety, increase occupational performance and particiaption, and decreased caregiver burden.      Follow Up Recommendations  Home health OT;Supervision/Assistance - 24 hour(Aide, HHPT)    Equipment Recommendations  Other (comment)(Air mattress for hospital bed at home)    Recommendations for Other Services PT consult     Precautions / Restrictions Precautions Precautions: Fall Precaution Comments: Legally blind; hears better from R ear Restrictions Weight Bearing Restrictions: No      Mobility Bed Mobility Overal bed mobility: Needs Assistance Bed Mobility: Rolling;Sidelying to Sit Rolling: Mod assist Sidelying to sit: Max assist       General bed mobility comments: Max assist to help lower legs off of the bed and elevate trunk to sit  Transfers Overall transfer level: Needs assistance Equipment used: 1 person hand held assist Transfers: Stand Pivot Transfers;Sit to/from Stand Sit to Stand: Max assist;+2  safety/equipment Stand pivot transfers: Max assist;+2 safety/equipment       General transfer comment: Max assist to power up to stand (but on the lighter side of Max assist). RN present for +2 safety to pivot to recliner. Blocking right knee and use of gait belt for facilitating weight shift    Balance Overall balance assessment: Needs assistance   Sitting balance-Leahy Scale: Fair Sitting balance - Comments: Static sitting at EOB with Min Guard A for safety     Standing balance-Leahy Scale: Poor Standing balance comment: Reilant on physical A                           ADL either performed or assessed with clinical judgement   ADL Overall ADL's : Needs assistance/impaired                                       General ADL Comments: Pt requring Max-Total A for ADLs including dressing, bathing, and toileting     Vision Baseline Vision/History: Legally blind Patient Visual Report: No change from baseline       Perception     Praxis      Pertinent Vitals/Pain Pain Assessment: No/denies pain     Hand Dominance Right   Extremity/Trunk Assessment Upper Extremity Assessment Upper Extremity Assessment: Generalized weakness   Lower Extremity Assessment Lower Extremity Assessment: Generalized weakness(LLE stronger > RLE)   Cervical / Trunk Assessment Cervical / Trunk Assessment: Kyphotic;Other exceptions Cervical / Trunk Exceptions: Increased body habitus   Communication Communication Communication: HOH   Cognition Arousal/Alertness: Awake/alert  Behavior During Therapy: WFL for tasks assessed/performed Overall Cognitive Status: Within Functional Limits for tasks assessed                                 General Comments: Following simple cues    General Comments  Daughter present throughout session    Exercises     Shoulder Instructions      Home Living Family/patient expects to be discharged to:: Private  residence Living Arrangements: Children Available Help at Discharge: Family;Personal care attendant;Other (Comment);Available 24 hours/day(and sitter) Type of Home: House Home Access: Ramped entrance     Home Layout: One level               Home Equipment: Wheelchair - manual;Hospital bed          Prior Functioning/Environment Level of Independence: Needs assistance  Gait / Transfers Assistance Needed: Mostly wheelchair transfer level of mobility ADL's / Homemaking Assistance Needed: Family and Aide helps with sponge bathing at bed level. Pt wears depends and family performs toielt hygiene after use.             OT Problem List: Decreased strength;Decreased range of motion;Decreased activity tolerance;Impaired balance (sitting and/or standing);Decreased safety awareness;Decreased knowledge of use of DME or AE;Decreased knowledge of precautions;Pain;Increased edema;Obesity      OT Treatment/Interventions: Self-care/ADL training;Therapeutic exercise;Energy conservation;DME and/or AE instruction;Therapeutic activities;Patient/family education    OT Goals(Current goals can be found in the care plan section) Acute Rehab OT Goals Patient Stated Goal: "Go home" OT Goal Formulation: With patient/family Time For Goal Achievement: 09/26/17 Potential to Achieve Goals: Good ADL Goals Pt Will Perform Grooming: with set-up;with supervision;bed level;sitting Pt Will Transfer to Toilet: with mod assist;bedside commode;stand pivot transfer;with +2 assist Additional ADL Goal #1: Pt will perform bed mobility with Min A in preparation for ADLs  OT Frequency: Min 2X/week   Barriers to D/C:            Co-evaluation              AM-PAC PT "6 Clicks" Daily Activity     Outcome Measure Help from another person eating meals?: A Little Help from another person taking care of personal grooming?: A Little Help from another person toileting, which includes using toliet, bedpan, or  urinal?: Total Help from another person bathing (including washing, rinsing, drying)?: A Lot Help from another person to put on and taking off regular upper body clothing?: A Lot Help from another person to put on and taking off regular lower body clothing?: Total 6 Click Score: 12   End of Session Equipment Utilized During Treatment: Gait belt Nurse Communication: Mobility status  Activity Tolerance: Patient tolerated treatment well Patient left: in chair;with call bell/phone within reach;with nursing/sitter in room;with family/visitor present  OT Visit Diagnosis: Unsteadiness on feet (R26.81);Other abnormalities of gait and mobility (R26.89);Muscle weakness (generalized) (M62.81)                Time: 8250-5397 OT Time Calculation (min): 27 min Charges:  OT General Charges $OT Visit: 1 Visit OT Evaluation $OT Eval Moderate Complexity: 1 Mod OT Treatments $Self Care/Home Management : 8-22 mins  Andrienne Havener MSOT, OTR/L Acute Rehab Pager: [phone] Office: [place] 09/12/2017, 12:29 PM

## 2017-09-13 ENCOUNTER — Inpatient Hospital Stay (HOSPITAL_COMMUNITY): Payer: Medicare HMO

## 2017-09-13 DIAGNOSIS — Z515 Encounter for palliative care: Secondary | ICD-10-CM

## 2017-09-13 DIAGNOSIS — Z7189 Other specified counseling: Secondary | ICD-10-CM

## 2017-09-13 LAB — URINE CULTURE

## 2017-09-13 LAB — HEMOGLOBIN AND HEMATOCRIT, BLOOD
HCT: 33 % — ABNORMAL LOW (ref 36.0–46.0)
Hemoglobin: 10.4 g/dL — ABNORMAL LOW (ref 12.0–15.0)

## 2017-09-13 LAB — CBC
HEMATOCRIT: 25.7 % — AB (ref 36.0–46.0)
HEMOGLOBIN: 8.1 g/dL — AB (ref 12.0–15.0)
MCH: 27.5 pg (ref 26.0–34.0)
MCHC: 31.5 g/dL (ref 30.0–36.0)
MCV: 87.1 fL (ref 78.0–100.0)
Platelets: 215 10*3/uL (ref 150–400)
RBC: 2.95 MIL/uL — ABNORMAL LOW (ref 3.87–5.11)
RDW: 16.5 % — AB (ref 11.5–15.5)
WBC: 9.8 10*3/uL (ref 4.0–10.5)

## 2017-09-13 LAB — BASIC METABOLIC PANEL
Anion gap: 9 (ref 5–15)
BUN: 44 mg/dL — AB (ref 8–23)
CALCIUM: 8.3 mg/dL — AB (ref 8.9–10.3)
CHLORIDE: 109 mmol/L (ref 98–111)
CO2: 22 mmol/L (ref 22–32)
CREATININE: 2.32 mg/dL — AB (ref 0.44–1.00)
GFR calc Af Amer: 19 mL/min — ABNORMAL LOW (ref >60)
GFR calc non Af Amer: 17 mL/min — ABNORMAL LOW (ref >60)
Glucose, Bld: 95 mg/dL (ref 70–99)
Potassium: 3.8 mmol/L (ref 3.5–5.1)
SODIUM: 140 mmol/L (ref 135–145)

## 2017-09-13 LAB — GLUCOSE, CAPILLARY
GLUCOSE-CAPILLARY: 100 mg/dL — AB (ref 70–99)
GLUCOSE-CAPILLARY: 140 mg/dL — AB (ref 70–99)
Glucose-Capillary: 95 mg/dL (ref 70–99)
Glucose-Capillary: 170 mg/dL — ABNORMAL HIGH (ref 70–99)

## 2017-09-13 LAB — MAGNESIUM: Magnesium: 1.9 mg/dL (ref 1.7–2.4)

## 2017-09-13 LAB — PROTIME-INR
INR: 3.93
Prothrombin Time: 38.2 seconds — ABNORMAL HIGH (ref 11.4–15.2)

## 2017-09-13 MED ORDER — POTASSIUM CHLORIDE 20 MEQ PO PACK
20.0000 meq | PACK | Freq: Once | ORAL | Status: AC
  Administered 2017-09-13: 20 meq via ORAL
  Filled 2017-09-13: qty 1

## 2017-09-13 MED ORDER — FUROSEMIDE 20 MG PO TABS
20.0000 mg | ORAL_TABLET | Freq: Two times a day (BID) | ORAL | Status: DC
  Administered 2017-09-14: 20 mg via ORAL
  Filled 2017-09-13 (×2): qty 1

## 2017-09-13 NOTE — Progress Notes (Signed)
Paged MD Florene Glen after renal US was completed and resulted. Per MD remove Foley,keep watching the patient and complete bladder scan if patient is not voiding.  Foley removed at 17:08.   Haron Beilke, RN

## 2017-09-13 NOTE — Progress Notes (Signed)
Progress Note  Patient Name: [name] Date of Encounter: 09/13/2017  Primary Cardiologist: Sanda Klein, MD   Subjective   Breathing better slept well as did daughter in chair   Inpatient Medications    Scheduled Meds: . donepezil  5 mg Oral QHS  . folic acid  1 mg Oral Daily  . furosemide  20 mg Intravenous BID  . gabapentin  100 mg Oral BID  . insulin aspart  0-5 Units Subcutaneous QHS  . insulin aspart  0-9 Units Subcutaneous TID WC  . levothyroxine  62.5 mcg Oral QAC breakfast  . lubiprostone  24 mcg Oral BID WC  . pantoprazole  40 mg Oral BID AC  . rosuvastatin  20 mg Oral q1800  . sodium chloride flush  3 mL Intravenous Q12H  . tamsulosin  0.4 mg Oral QPC supper   Continuous Infusions: . sodium chloride    . meropenem (MERREM) IV 500 mg (09/12/17 2302)   PRN Meds: sodium chloride, acetaminophen, albuterol, atropine, guaiFENesin, meclizine, ofloxacin, ondansetron (ZOFRAN) IV, sodium chloride flush, traMADol, zolpidem   Vital Signs    Vitals:   09/12/17 1438 09/12/17 1735 09/12/17 2022 09/13/17 0551  BP: (!) 97/51 98/63 (!) 103/56 (!) 106/52  Pulse: 80 81 85 83  Resp:   18 18  Temp:   97.8 F (36.6 C) 98.6 F (37 C)  TempSrc:   Oral Oral  SpO2:   97% 97%  Weight:    104 kg  Height:        Intake/Output Summary (Last 24 hours) at 09/13/2017 0819 Last data filed at 09/13/2017 0610 Gross per 24 hour  Intake 864.91 ml  Output 1150 ml  Net -285.09 ml   Filed Weights   09/11/17 0553 09/12/17 0651 09/13/17 0551  Weight: 82.9 kg 82.4 kg 104 kg    Telemetry    NSR 09/13/2017  - Personally Reviewed  ECG    NSR no acute chagnes  - Personally Reviewed  Physical Exam  Elderly black female  GEN: No acute distress.   Neck: No JVD Cardiac: RRR, no murmurs, rubs, or gallops.  Respiratory: basilar crackles improved . GI: Soft, nontender, non-distended  MS: No edema; No deformity. Neuro:  Nonfocal  Psych: Normal affect   Labs      Chemistry Recent Labs  Lab 09/10/17 1529 09/11/17 0423 09/12/17 0431 09/13/17 0341  NA 140 136 138 140  K 3.2* 4.5 4.2 3.8  CL 105 103 106 109  CO2 25 25 19* 22  GLUCOSE 201* 355* 195* 95  BUN 30* 36* 46* 44*  CREATININE 2.27* 2.39* 2.36* 2.32*  CALCIUM 8.6* 8.5* 8.7* 8.3*  PROT 6.3*  --   --   --   ALBUMIN 2.1*  --   --   --   AST 22  --   --   --   ALT 10  --   --   --   ALKPHOS 108  --   --   --   BILITOT 0.7  --   --   --   GFRNONAA 17* 16* 16* 17*  GFRAA 20* 19* 19* 19*  ANIONGAP 10 8 13 9      Hematology Recent Labs  Lab 09/11/17 0423 09/12/17 0431 09/13/17 0341  WBC 8.9 14.4* 9.8  RBC 3.44* 3.23* 2.95*  HGB 9.4* 9.0* 8.1*  HCT 29.5* 28.8* 25.7*  MCV 85.8 89.2 87.1  MCH 27.3 27.9 27.5  MCHC 31.9 31.3 31.5  RDW 16.3* 17.0*  16.5*  PLT 206 230 215    Cardiac EnzymesNo results for input(s): TROPONINI in the last 168 hours.  Recent Labs  Lab 09/10/17 1508  TROPIPOC 0.01     BNP Recent Labs  Lab 09/10/17 1531  BNP 264.4*     DDimer No results for input(s): DDIMER in the last 168 hours.   Radiology    No results found.  Cardiac Studies   Echo EF 25-30% mild MR  Patient Profile     [name] is a 84 y.o. female with a hx of CAD s/p CABG 1994, chronic combined systolic and diastolic heart failure, paroxysmal atrial fibrillation on coumadin, HLD, DM2 with retinopathy and neuropathy, CKD stage III, and complete heart block s/p dual-chamber PPM (2008, 2017, [employer]) who is being seen today for the evaluation of CHF exacerbation at the request of Dr. Florene Glen.  Assessment & Plan    1. Acute on chronic systolic and diastolic heart failure - echo this admission with LVEF 25-30% and grade 1 DD - dry weight at last two clinic visits: 169 lbs (04/12/17), 176 lbs (06/15/17) - weight on admission was 181 lbs - currently on 20 iv bid lasix change to oral in am    2. Coronary artery disease s/p CABG - troponin 0.06 --> 0.04 --> 0.04 - trend  is mild and flat, in the setting of AonCKD - low suspicion for an ACS process, would treat conservatively   3. Hypokalemia - resolved - K is  3.8 today supplement    4. Acute on chronic kidney disease stage III, urinary retention with urinary tract infection with sepsis - creatinine 2.36 (2.39) - per primary   5. Elevated INR, PAF - INR 3.93  - continue holding coumadin would likely resume tomorrow   6. PPM:  Followed by Dr Sallyanne Kuster normal function she is pacer dependent with ECG on admission showing appropriate A sensing and V pacing    [facility] will sign off.   Medication Recommendations:  Lasix to oral tomorrow Other recommendations (labs, testing, etc):  None change code status to full  Follow up as an outpatient:  Will arrange with Dr Sallyanne Kuster  For questions or updates, please contact [place] Please consult [email or web address] for contact info under Cardiology/STEMI.      Signed, Jenkins Rouge, MD  09/13/2017, 8:19 AM

## 2017-09-13 NOTE — Progress Notes (Addendum)
PROGRESS NOTE    [name]  AOZ:[id number] DOB: 1920/05/08 DOA: 09/10/2017 PCP: [name], MD   Brief Narrative:  84 year old female with history of type 2 diabetes mellitus, heart block with pacer in place, chronic anemia, chronic combined systolic and diastolic CHF, hypothyroidism and chronic kidney disease stage III, who presents to the ED on 8/26 with shortness of breath, cough, nausea and vomiting.  This is been going on for a few days.  She also reports worsening lower extremity swelling.  Patient's daughter is at bedside and she mentions that patient has been having difficulties urinating over the last 3 to 4 days and takes her a long time to pee.  She has seen a urologist 1 or 2 weeks ago (daughter is not sure) and at that time she was given Macrobid but daughter is not sure about the details.   Assessment & Plan:   Principal Problem:   Acute on chronic combined systolic and diastolic CHF (congestive heart failure) (HCC) Active Problems:   Hypokalemia   Anemia of chronic disease   Nausea & vomiting   PAF (paroxysmal atrial fibrillation) (HCC)   Acquired autoimmune hypothyroidism   Complete heart block (HCC)   Renal insufficiency   SIRS (systemic inflammatory response syndrome) ([place])   Acute urinary retention likely in the setting of urinary tract infection with sepsis - Met sepsis criteria on admission with significantly elevated white count of 17,000, tachycardia - Patient had been having difficulty urinating, started on macrobid for UTI (plans at that time for INO cath per previous provider, but pt able to void on her own) - UA on admission with squams, but + LE and WBC's suggestive of infection - Urine cx with >100,000 unidentified organisms (pending -> addendum, grew lactobacillus, not typical urinary pathogen, but pt did have >100,000 CFU's.   Will d/c abx and follow symptoms.) - Meropenem given hx of ESBL infection - Started on flomax, planning trial of void  today after renal US (bladder scans ordered for after)  Acute on chronic combined systolic and diastolic CHF -Most recent 2D echo dates back to 2017 with an EF of 45 to 50% - Repeat echo on this admission with EF 25-30% (see report) - cardiology was consulted, appreciate recs - continue diuresis with lasix 20 IV BID -> transition to PO tomorrow  Wt Readings from Last 3 Encounters:  09/13/17 104 kg  07/13/17 81.6 kg  06/15/17 79.8 kg    Acute kidney injury on chronic kidney disease stage III -Creatinine was 1.2 in April, on admission 2.27.  Creatinine peaked to 2.39.  Relatively stable today.  Possibly related to urinary retention with some obstruction vs volume overload.  Gradually improving 2.32 today with diuresis, continue. - Foley in place (planning to d/c after renal US) - Follow with continued diuresis - Will get renal US.  Type 2 diabetes mellitus -Hold glipizide, continue sliding scale.  BG stable.  Paroxysmal A. Fib, supratherapeutic INR -She is in sinus rhythm, INR supratherapeutic, follow - hold coumadin  Hypokalemia - improved, follow  Nausea vomiting -seems to be resolved  Normocytic anemia -stable, follow  Leukocytosis: 2/2 above, improved  Hypothyroidism -Continue Synthroid  Anemia: H/H dropped to 8.1 this AM, repeat in afternoon.  No evidence of bleeding.  Follow.  Goals of Care:  Currently partial code.  Discussed with pt daughter who noted they'd had conversation in 2013 before and she desired full code.  Will transition to full code at this time.  Also, placed  palliative care c/s to continue to discuss goc.    DVT prophylaxis: warfarin Code Status: partial  Family Communication: daughter at bedside Disposition Plan: pending improvement, possibly within 24-48 hours   Consultants:   cardiology  Procedures:  Study Conclusions  - Left ventricle: The cavity size was normal. Wall thickness was   increased in a pattern of mild LVH.  Severe anterior and   anterolateral hypokinesis. Apical septal akinesis, apical   inferior akinesis, akinesis of the true apex. Basal-mid   inferolateral akinesis. Systolic function was severely reduced.   The estimated ejection fraction was in the range of 25% to 30%.   Doppler parameters are consistent with abnormal left ventricular   relaxation (grade 1 diastolic dysfunction). - Aortic valve: Trileaflet; moderately calcified leaflets. There   was no stenosis. There was trivial regurgitation. - Mitral valve: There was mild regurgitation. - Left atrium: The atrium was mildly dilated. - Right ventricle: The cavity size was normal. Systolic function   was mildly reduced. - Tricuspid valve: Peak RV-RA gradient (S): 25 mm Hg. - Pulmonary arteries: PA peak pressure: 28 mm Hg (S). - Inferior vena cava: The vessel was normal in size. The   respirophasic diameter changes were in the normal range (>= 50%),   consistent with normal central venous pressure.  Impressions:  - Normal LV size with mild LV hypertrophy. Multiple wall motion   abnormalities as described above, EF 25-30%. Normal RV size with   mildly decreased systolic function. Mild mitral regurgitation.   Antimicrobials:  Anti-infectives (From admission, onward)   Start     Dose/Rate Route Frequency Ordered Stop   09/11/17 2200  meropenem (MERREM) 500 mg in sodium chloride 0.9 % 100 mL IVPB     500 mg 200 mL/hr over 30 Minutes Intravenous Every 12 hours 09/11/17 1140     09/11/17 1700  cefTRIAXone (ROCEPHIN) 1 g in sodium chloride 0.9 % 100 mL IVPB  Status:  Discontinued     1 g 200 mL/hr over 30 Minutes Intravenous Every 24 hours 09/10/17 1957 09/11/17 0754   09/11/17 1700  azithromycin (ZITHROMAX) 500 mg in sodium chloride 0.9 % 250 mL IVPB  Status:  Discontinued     500 mg 250 mL/hr over 60 Minutes Intravenous Every 24 hours 09/10/17 1957 09/11/17 0754   09/11/17 1000  meropenem (MERREM) 1 g in sodium chloride 0.9 % 100  mL IVPB  Status:  Discontinued     1 g 200 mL/hr over 30 Minutes Intravenous Every 12 hours 09/11/17 0855 09/11/17 1140   09/10/17 1715  cefTRIAXone (ROCEPHIN) 1 g in sodium chloride 0.9 % 100 mL IVPB     1 g 200 mL/hr over 30 Minutes Intravenous  Once 09/10/17 1709 09/10/17 1749   09/10/17 1715  doxycycline (VIBRA-TABS) tablet 100 mg     100 mg Oral  Once 09/10/17 1710 09/10/17 1718     Subjective: Describes chronic LE pain. No other complaints. Daughter at bedside.    Objective: Vitals:   09/12/17 1438 09/12/17 1735 09/12/17 2022 09/13/17 0551  BP: (!) 97/51 98/63 (!) 103/56 (!) 106/52  Pulse: 80 81 85 83  Resp:   18 18  Temp:   97.8 F (36.6 C) 98.6 F (37 C)  TempSrc:   Oral Oral  SpO2:   97% 97%  Weight:    104 kg  Height:        Intake/Output Summary (Last 24 hours) at 09/13/2017 0955 Last data filed at 09/13/2017 0610 Gross per  24 hour  Intake 864.91 ml  Output 1150 ml  Net -285.09 ml   Filed Weights   09/11/17 0553 09/12/17 0651 09/13/17 0551  Weight: 82.9 kg 82.4 kg 104 kg    Examination:  General: No acute distress. Cardiovascular: Heart sounds show a regular rate, and rhythm. Lungs: Crackles at bases Abdomen: Soft, nontender, nondistended  Neurological: Alert and oriented. Moves all extremities 4. Cranial nerves II through XII grossly intact. Skin: Warm and dry. No rashes or lesions. Extremities: No clubbing or cyanosis. Bilateral 1+ LEE.  Psychiatric: Mood and affect are normal. Insight and judgment are appropriate.   Data Reviewed: I have personally reviewed following labs and imaging studies  CBC: Recent Labs  Lab 09/10/17 1529 09/11/17 0423 09/12/17 0431 09/13/17 0341  WBC 17.4* 8.9 14.4* 9.8  NEUTROABS 14.5* 7.9*  --   --   HGB 9.1* 9.4* 9.0* 8.1*  HCT 29.6* 29.5* 28.8* 25.7*  MCV 89.4 85.8 89.2 87.1  PLT 232 206 230 509   Basic Metabolic Panel: Recent Labs  Lab 09/10/17 1529 09/11/17 0423 09/12/17 0431 09/13/17 0341  NA 140  136 138 140  K 3.2* 4.5 4.2 3.8  CL 105 103 106 109  CO2 25 25 19* 22  GLUCOSE 201* 355* 195* 95  BUN 30* 36* 46* 44*  CREATININE 2.27* 2.39* 2.36* 2.32*  CALCIUM 8.6* 8.5* 8.7* 8.3*  MG  --  2.0  --  1.9   GFR: Estimated Creatinine Clearance: 16.7 mL/min (A) (by C-G formula based on SCr of 2.32 mg/dL (H)). Liver Function Tests: Recent Labs  Lab 09/10/17 1529  AST 22  ALT 10  ALKPHOS 108  BILITOT 0.7  PROT 6.3*  ALBUMIN 2.1*   No results for input(s): LIPASE, AMYLASE in the last 168 hours. No results for input(s): AMMONIA in the last 168 hours. Coagulation Profile: Recent Labs  Lab 09/10/17 1529 09/11/17 0423 09/12/17 0431 09/13/17 0341  INR 4.86* 5.94* 5.83* 3.93   Cardiac Enzymes: No results for input(s): CKTOTAL, CKMB, CKMBINDEX, TROPONINI in the last 168 hours. BNP (last 3 results) No results for input(s): PROBNP in the last 8760 hours. HbA1C: No results for input(s): HGBA1C in the last 72 hours. CBG: Recent Labs  Lab 09/12/17 0805 09/12/17 1253 09/12/17 1643 09/12/17 2132 09/13/17 0740  GLUCAP 179* 145* 116* 101* 95   Lipid Profile: No results for input(s): CHOL, HDL, LDLCALC, TRIG, CHOLHDL, LDLDIRECT in the last 72 hours. Thyroid Function Tests: No results for input(s): TSH, T4TOTAL, FREET4, T3FREE, THYROIDAB in the last 72 hours. Anemia Panel: No results for input(s): VITAMINB12, FOLATE, FERRITIN, TIBC, IRON, RETICCTPCT in the last 72 hours. Sepsis Labs: Recent Labs  Lab 09/10/17 1529 09/11/17 0423 09/12/17 0431  PROCALCITON 1.43 1.49 1.31    Recent Results (from the past 240 hour(s))  Culture, blood (routine x 2)     Status: None (Preliminary result)   Collection Time: 09/10/17  3:29 PM  Result Value Ref Range Status   Specimen Description BLOOD RIGHT ANTECUBITAL  Final   Special Requests   Final    BOTTLES DRAWN AEROBIC AND ANAEROBIC Blood Culture adequate volume   Culture   Final    NO GROWTH 3 DAYS Performed at [facility] [place] [address] [place], [place] [address]    Report Status PENDING  Incomplete  Culture, blood (routine x 2)     Status: None (Preliminary result)   Collection Time: 09/10/17  3:29 PM  Result Value Ref Range Status  Specimen Description BLOOD RIGHT HAND  Final   Special Requests   Final    BOTTLES DRAWN AEROBIC AND ANAEROBIC Blood Culture adequate volume   Culture   Final    NO GROWTH 3 DAYS Performed at [facility], [address], [place], [place] [address]    Report Status PENDING  Incomplete  Urine culture     Status: Abnormal (Preliminary result)   Collection Time: 09/11/17  4:08 AM  Result Value Ref Range Status   Specimen Description URINE, CATHETERIZED  Final   Special Requests NONE  Final   Culture (A)  Final    >=100,000 COLONIES/mL UNIDENTIFIED ORGANISM IDENTIFICATION TO FOLLOW Performed at [facility] [address] [place], [place] [address]    Report Status PENDING  Incomplete         Radiology Studies: No results found.      Scheduled Meds: . donepezil  5 mg Oral QHS  . folic acid  1 mg Oral Daily  . furosemide  20 mg Intravenous BID  . gabapentin  100 mg Oral BID  . insulin aspart  0-5 Units Subcutaneous QHS  . insulin aspart  0-9 Units Subcutaneous TID WC  . levothyroxine  62.5 mcg Oral QAC breakfast  . lubiprostone  24 mcg Oral BID WC  . pantoprazole  40 mg Oral BID AC  . rosuvastatin  20 mg Oral q1800  . sodium chloride flush  3 mL Intravenous Q12H  . tamsulosin  0.4 mg Oral QPC supper   Continuous Infusions: . sodium chloride    . meropenem (MERREM) IV 500 mg (09/12/17 2302)     LOS: 3 days    Time spent: over 30 min MDM moderate with multiple medical issues    Fayrene Helper, MD [facility] Pager [phone]   If 7PM-7AM, please contact night-coverage [email or web address] Password [facility] 09/13/2017, 9:55 AM

## 2017-09-13 NOTE — Progress Notes (Signed)
Patient got back from renal US. Awaiting on results to page MD.   Venetia Night, RN

## 2017-09-13 NOTE — Progress Notes (Signed)
Patient voided after foley removed.  Patsie Mccardle, RN

## 2017-09-13 NOTE — Consult Note (Signed)
Consultation Note Date: 09/13/2017   Patient Name: [name]  DOB: 1920/01/20  MRN: [id number]  Age / Sex: 84 y.o., female  PCP: [name], MD Referring Physician: Elodia Florence., *  Reason for Consultation: Establishing goals of care  HPI/Patient Profile: 84 year old female with history of type 2 diabetes mellitus, heart block with pacer in place, chronic anemia, chronic combined systolic and diastolic CHF, hypothyroidism and chronic kidney disease stage III, who presents to the ED on 8/26 with shortness of breath, cough, nausea and vomiting.    Clinical Assessment and Goals of Care: Patient is sitting in bed, daughter is at bedside providing nail care. Patient is somewhat confused presently. Her daughter states sometimes she is confused, and other times she is not.   [name] is widowed since 2010. Her daughter [name] lives with her. 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 She has 4 living children. 4 of her children have died. 2 of them were placed on a ventilator; 1 coded and died, the other had care withdrawn.  She is mostly bed and chair bound, and uses a lift to transfer. She enjoys going to church. She is legally blind and can see some colors. She feeds herself with assistance. Per her daughter, she has always been a finicky eater. She can wash her upper body. She uses depends.   A sitter comes 3 times per week. She has home health nursing and physical therapy.   Plans for family meeting either tomorrow or Saturday to discuss goals of care. [name] will call to arrange an appointment. She recalls that [place] have been discussed before, but understands they are in a different place now.    SUMMARY OF RECOMMENDATIONS    Plans for family meeting either tomorrow or Saturday to discuss goals of care. [name] will call to arrange an appointment.    Symptom  Management:   Per primary team  Palliative Prophylaxis:   Eye Care and Oral Care  Prognosis:   Poor long term.   Discharge Planning: To Be Determined      Primary Diagnoses: Present on Admission: . Acute on chronic combined systolic and diastolic CHF (congestive heart failure) ([place]) . Anemia of chronic disease . PAF (paroxysmal atrial fibrillation) ([place]) . Complete heart block ([place]) . Acquired autoimmune hypothyroidism . Hypokalemia . SIRS (systemic inflammatory response syndrome) (H[facility]) . Nausea & vomiting   I have reviewed the medical record, interviewed the patient and family, and examined the patient. The following aspects are pertinent.  Past Medical History:  Diagnosis Date  . 2nd degree atrioventricular block    a. 06/22/15 Gen change: MDT ADDR01 Adapta, DC PPM (ser# [id number]).  . Allergy to perfume   . Anemia   . Arthritis   . Chronic combined systolic and diastolic CHF (congestive heart failure) ([place])    a. 01/2014 Echo: EF @ least mod-sev reduced with HK of lat/apical, basal inf walls. basalpost AK, Gr 1DD; b. 06/2015 Echo: EF 45-50%, Gr2 DD, mod LVH.   Marland Kitchen  CKD (chronic kidney disease), stage III ([place])    a. iii - iv.  . Coronary artery disease    a. 1994 s/p cabg;  b. 09/2013 MV: large, sev intensity, partially reversible inf, apical defect, prior inf/ap infarct w/ mild peri-infarct ischemia->Med Rx; c. 06/2015 NSTEMI (trop 6)->Med rx.  . Daily headache   . DVT of upper extremity (deep vein thrombosis) ([place]) 06/13/2012   BUE  . Dyspepsia   . Gastric ulcer   . GERD (gastroesophageal reflux disease)   . H/O: GI bleed 12//13  . Hiatal hernia   . High cholesterol   . History of blood transfusion 2013  . Hypertensive heart disease   . Hypothyroid   . Legally blind   . PAF (paroxysmal atrial fibrillation) (H[facility])    a. CHA2DS2VASc = 7-->coumadin.  . Presence of permanent cardiac pacemaker    a. 06/22/15 Gen change: MDT ADDR01 Adapta, DC PPM (ser# [id number]).  .  Seasonal allergies   . Type II diabetes mellitus ([place])   . UTI (lower urinary tract infection)    Social History   Socioeconomic History  . Marital status: Widowed    Spouse name: Not on file  . Number of children: 10  . Years of education: Not on file  . Highest education level: Not on file  Occupational History    Employer: RETIRED  Social Needs  . Financial resource strain: Not on file  . Food insecurity:    Worry: Not on file    Inability: Not on file  . Transportation needs:    Medical: Not on file    Non-medical: Not on file  Tobacco Use  . Smoking status: Never Smoker  . Smokeless tobacco: Never Used  Substance and Sexual Activity  . Alcohol use: No    Alcohol/week: 0.0 standard drinks  . Drug use: No  . Sexual activity: Never  Lifestyle  . Physical activity:    Days per week: Not on file    Minutes per session: Not on file  . Stress: Not on file  Relationships  . Social connections:    Talks on phone: Not on file    Gets together: Not on file    Attends religious service: Not on file    Active member of club or organization: Not on file    Attends meetings of clubs or organizations: Not on file    Relationship status: Not on file  Other Topics Concern  . Not on file  Social History Narrative  . Not on file   Family History  Problem Relation Age of Onset  . Heart disease Father   . Diabetes Father   . Breast cancer Daughter   . Diabetes Son   . Diabetes Daughter   . Colon polyps Daughter   . Heart attack Brother   . Diabetes Brother   . Stroke Sister   . Colon cancer Neg Hx    Scheduled Meds: . donepezil  5 mg Oral QHS  . folic acid  1 mg Oral Daily  . furosemide  20 mg Intravenous BID  . [START ON 09/14/2017] furosemide  20 mg Oral BID  . gabapentin  100 mg Oral BID  . insulin aspart  0-5 Units Subcutaneous QHS  . insulin aspart  0-9 Units Subcutaneous TID WC  . levothyroxine  62.5 mcg Oral QAC breakfast  . lubiprostone  24 mcg Oral BID WC    . pantoprazole  40 mg Oral BID AC  . rosuvastatin  20  mg Oral q1800  . sodium chloride flush  3 mL Intravenous Q12H  . tamsulosin  0.4 mg Oral QPC supper   Continuous Infusions: . sodium chloride    . meropenem (MERREM) IV Stopped (09/13/17 1110)   PRN Meds:.sodium chloride, acetaminophen, albuterol, atropine, guaiFENesin, meclizine, ofloxacin, ondansetron (ZOFRAN) IV, sodium chloride flush, traMADol, zolpidem Medications Prior to Admission:  Prior to Admission medications   Medication Sig Start Date End Date Taking? Authorizing Provider  acetaminophen (TYLENOL) 500 MG tablet Take 500 mg by mouth every 6 (six) hours as needed (pain).   Yes [provider]  albuterol (PROAIR HFA) 108 (90 Base) MCG/ACT inhaler Inhale 2 puffs into the lungs every 6 (six) hours as needed for wheezing or shortness of breath.   Yes [provider]  AMITIZA 24 MCG capsule Take 1 capsule (24 mcg total) by mouth 2 (two) times daily with a meal. 06/04/17  Yes Nandigam, Kavitha V, MD  atropine 1 % ophthalmic solution Place 1 drop into both eyes daily as needed (burning eyes).  10/29/13  Yes [provider]  donepezil (ARICEPT) 5 MG tablet Take 5 mg by mouth at bedtime.  07/13/14  Yes [provider]  folic acid (FOLVITE) 1 MG tablet TAKE 1 TABLET BY MOUTH EVERY DAY Patient taking differently: Take 1 mg by mouth daily.  09/10/17  Yes Croitoru, Mihai, MD  furosemide (LASIX) 20 MG tablet Take 1 tablet (20 mg total) by mouth daily. 06/16/17  Yes Croitoru, Mihai, MD  gabapentin (NEURONTIN) 100 MG capsule Take 100 mg by mouth 2 (two) times daily.    Yes [provider]  guaifenesin (MUCUS RELIEF CHEST CONGESTION) 400 MG TABS tablet Take 400 mg by mouth 2 (two) times daily as needed (cough/ congestion).   Yes [provider]  ketoconazole (NIZORAL) 2 % cream Apply 1 application topically as needed for irritation.  03/21/17  Yes [provider]  levalbuterol Penne Lash)  0.63 MG/3ML nebulizer solution Take 3 mLs by nebulization 3 (three) times daily as needed for wheezing or shortness of breath.  09/06/16  Yes [provider]  loratadine (CLARITIN) 10 MG tablet Take 10 mg by mouth daily.   Yes [provider]  magnesium citrate SOLN Take 1 Bottle by mouth See admin instructions. Every 7 days as needed for bowel movement.   Yes [provider]  meclizine (ANTIVERT) 25 MG tablet Take 25 mg by mouth 3 (three) times daily as needed for dizziness.   Yes [provider]  NUTRITIONAL SUPPLEMENTS PO Enterex Diabetic Nutritional Beverage:   Yes [provider]  ofloxacin (OCUFLOX) 0.3 % ophthalmic solution Place 1 drop into both eyes 4 (four) times daily as needed (for drainage).  02/17/16  Yes [provider]  omeprazole (PRILOSEC) 40 MG capsule TAKE 1 CAPSULE BY MOUTH TWICE A DAY Patient taking differently: Take 40 mg by mouth 2 (two) times daily.  09/10/17  Yes Nandigam, Venia Minks, MD  OVER THE COUNTER MEDICATION Kidney supplement capsules (Choline 100 mg, trimethylglycine 50 mg, glutathione 50 mg, methyltetrahydrofolate, l-arginine-, l-ornithine, l-citruline): Take 1 capsule by mouth two to three times a week   Yes [provider]  Polyvinyl Alcohol-Povidone (REFRESH OP) Place 1 drop into both eyes daily as needed (dry eyes).    Yes [provider]  Potassium Chloride CR (MICRO-K) 8 MEQ CPCR capsule CR Take 1 capsule on Mondays and Thursdays, the days you take your Lasix. Patient taking differently: Take 8 mEq by mouth daily.  04/19/17  Yes Croitoru, Mihai, MD  rosuvastatin (CRESTOR) 20 MG tablet Take 1 tablet (20 mg total) by mouth daily. 03/26/17  Yes Croitoru, Mihai, MD  SYNTHROID 125 MCG tablet TAKE 1/2 TABLET BY MOUTH EVERY DAY Patient taking differently: Take 62.5 mcg by mouth daily before breakfast.  06/12/17  Yes Elayne Snare, MD  traMADol (ULTRAM) 50 MG tablet Take 1 tablet (50 mg total) by mouth 2  (two) times daily as needed for moderate pain. 09/11/16  Yes Hongalgi, Lenis Dickinson, MD  triamcinolone cream (KENALOG) 0.1 % Apply 1 application topically 2 (two) times daily. Flare up 03/21/17  Yes [provider]  warfarin (COUMADIN) 3 MG tablet TAKE 1 TABLET TO 1 AND 1/2 TABLETS EVERY DAY AS DIRECTED BY COUMADIN CLINIC. 08/18/17  Yes Nila Nephew, MD  zolpidem (AMBIEN) 10 MG tablet Take 5-10 mg by mouth at bedtime as needed for sleep.  08/08/16  Yes [provider]  ACCU-CHEK FASTCLIX LANCETS MISC Use to check sugar 2 times daily 07/17/17   Elayne Snare, MD  Blood Glucose Monitoring Suppl (ACCU-CHEK AVIVA PLUS) w/Device KIT Use to check sugars 2 times daily. 06/21/16   Elayne Snare, MD  glipiZIDE (GLUCOTROL) 5 MG tablet Take 1/2 tablet when blood sugar goes over 200 Patient taking differently: 2.5 mg. Take 1/2 tablet when blood sugar goes over 200 09/11/17   Elayne Snare, MD  [facility] VERIO test strip USE TO TEST BLOOD SUGAR TWICE A DAY Patient taking differently: 1 each by Other route 2 (two) times daily.  07/10/17   Elayne Snare, MD   Allergies  Allergen Reactions  . Macrobid [Nitrofurantoin] Rash, Other (See Comments) and Cough    Wheezing (also) THIS REACTION RESULTED IN THE PATIENT ENDING UP IN THE E.D.  . Darvon Other (See Comments)    Causes confusion  . Digoxin And Related Nausea Only  . Penicillins Other (See Comments)    From childhood: Has patient had a PCN reaction causing immediate rash, facial/tongue/throat swelling, SOB or lightheadedness with hypotension: Unknown Has patient had a PCN reaction causing severe rash involving mucus membranes or skin necrosis: Unknown Has patient had a PCN reaction that required hospitalization: Unknown Has patient had a PCN reaction occurring within the last 10 years: No If all of the above answers are "NO", then may proceed with Cephalosporin use.   Marland Kitchen Percocet [Oxycodone-Acetaminophen] Other (See Comments)    Causes confusion  . Percodan  [Oxycodone-Aspirin] Other (See Comments)    Causes confusion  . Vicodin [Hydrocodone-Acetaminophen] Other (See Comments)    Causes confusion   Review of Systems  All other systems reviewed and are negative.   Physical Exam  Constitutional: No distress.  Pulmonary/Chest: Effort normal.  Neurological: She is alert.  Skin: Skin is warm and dry.    Vital Signs: BP 114/83 (BP Location: Left Arm)   Pulse 85   Temp 98.4 F (36.9 C) (Oral)   Resp 18   Ht _0  (1.6 m)   Wt 104 kg   SpO2 95%   BMI 40.61 kg/m  Pain Scale: 0-10   Pain Score: 7    SpO2: SpO2: 95 % O2 Device:SpO2: 95 % O2 Flow Rate: .   IO: Intake/output summary:   Intake/Output Summary (Last 24 hours) at 09/13/2017 1502 Last data filed at 09/13/2017 1300 Gross per 24 hour  Intake 764.91 ml  Output 1400 ml  Net -635.09 ml    LBM: Last BM Date: 09/12/17 Baseline Weight: Weight: 93.9 kg Most recent weight:  Weight: 104 kg     Palliative Assessment/Data:     Time In: 2: 20 Time Out: 3:10 Time Total: 50 min Greater than 50%  of this time was spent counseling and coordinating care related to the above assessment and plan.  Signed by: Asencion Gowda, NP   Please contact Palliative Medicine Team phone at [phone] for questions and concerns.  For individual provider: See Shea Evans

## 2017-09-13 NOTE — Progress Notes (Signed)
Physical Therapy Treatment Patient Details Name: [name] MRN: [id number] DOB: 1920/03/01 Today's Date: 09/13/2017    History of Present Illness 84 year old female with history of type 2 diabetes mellitus, heart block with pacer in place, chronic anemia, chronic combined systolic and diastolic CHF, hypothyroidism and chronic kidney disease stage III, who presents to the ED on 8/26 with shortness of breath, cough, nausea and vomiting. Diagnosed with UTI with sepsis    PT Comments    Pt very pleasant with daughter [name] present in room. [name] reports pt is normally max assist for standing and pivoting holding unto daughter's neck. [name] educated for proper handling with pt holding onto upper arm and [name] assisting transfer with pad at pelvis with both pt and caregiver reporting ease in transition with increased effort from pt in this manner. Pt educated for HEP and encouraged to perform daily while in chair. Daughter very appreciative of education and practice and encouraged to discuss possible sliding board use of car transfers with home therapist.     Follow Up Recommendations  Home health PT;Supervision/Assistance - 24 hour;Other (comment)     Equipment Recommendations  None recommended by PT    Recommendations for Other Services       Precautions / Restrictions Precautions Precautions: Fall Precaution Comments: Legally blind; hears better from R ear    Mobility  Bed Mobility Overal bed mobility: Needs Assistance Bed Mobility: Rolling;Sidelying to Sit Rolling: Mod assist Sidelying to sit: Max assist       General bed mobility comments: Max assist to help lower legs off of the bed and elevate trunk to sit  Transfers Overall transfer level: Needs assistance   Transfers: Sit to/from Stand;Stand Pivot Transfers Sit to Stand: Max assist Stand pivot transfers: Max assist;+2 safety/equipment       General transfer comment: pt able to stand from bed and recliner  with max assist with cues for hand placement on therapist arms x 2 trials and 3rd trial with assist of daughter. Use of bed pad to control and elevate pelvis each trial with right knee blocked and cues for sequence. With pivot tactile cues and assist to step with RLE  Ambulation/Gait             General Gait Details: unable   Stairs             Wheelchair Mobility    Modified Rankin (Stroke Patients Only)       Balance Overall balance assessment: Needs assistance   Sitting balance-Leahy Scale: Fair Sitting balance - Comments: Static sitting at EOB with Min Guard A for safety     Standing balance-Leahy Scale: Poor Standing balance comment: bil UE support in standing                            Cognition Arousal/Alertness: Awake/alert Behavior During Therapy: WFL for tasks assessed/performed Overall Cognitive Status: Within Functional Limits for tasks assessed                                 General Comments: slow processing, following single step commands      Exercises General Exercises - Lower Extremity Long Arc Quad: AROM;AAROM;10 reps;Seated;Left;Right(AAROM on RLE) Hip Flexion/Marching: AAROM;AROM;Right;Left;Seated;10 reps(AAROM on RLE)    General Comments        Pertinent Vitals/Pain Pain Assessment: No/denies pain    Home Living  Prior Function            PT Goals (current goals can now be found in the care plan section) Progress towards PT goals: Progressing toward goals    Frequency    Min 3X/week      PT Plan Current plan remains appropriate    Co-evaluation              AM-PAC PT "6 Clicks" Daily Activity  Outcome Measure  Difficulty turning over in bed (including adjusting bedclothes, sheets and blankets)?: Unable Difficulty moving from lying on back to sitting on the side of the bed? : Unable Difficulty sitting down on and standing up from a chair with arms  (e.g., wheelchair, bedside commode, etc,.)?: Unable Help needed moving to and from a bed to chair (including a wheelchair)?: A Lot Help needed walking in hospital room?: Total Help needed climbing 3-5 steps with a railing? : Total 6 Click Score: 7    End of Session Equipment Utilized During Treatment: Gait belt Activity Tolerance: Patient tolerated treatment well Patient left: in chair;with call bell/phone within reach;with chair alarm set Nurse Communication: Mobility status PT Visit Diagnosis: Unsteadiness on feet (R26.81);Other abnormalities of gait and mobility (R26.89);Muscle weakness (generalized) (M62.81)     Time: 9791-5041 PT Time Calculation (min) (ACUTE ONLY): 36 min  Charges:  $Therapeutic Exercise: 8-22 mins $Therapeutic Activity: 8-22 mins                     [address] PT [phone]    Sandy Salaam Kert Shackett 09/13/2017, 9:36 AM

## 2017-09-13 NOTE — Progress Notes (Signed)
Occupational Therapy Treatment Patient Details Name: [name] MRN: [id number] DOB: [birth year] Today's Date: 09/13/2017    History of present illness 84 year old female with history of type 2 diabetes mellitus, heart block with pacer in place, chronic anemia, chronic combined systolic and diastolic CHF, hypothyroidism and chronic kidney disease stage III, who presents to the ED on 8/26 with shortness of breath, cough, nausea and vomiting. Diagnosed with UTI with sepsis   OT comments  This 84 yo female admitted with above presents to acute OT with making progress with bed mobility to A with basic ADLs. She will continue to benefit from acute OT with follow up [place] and [place].    Follow Up Recommendations  Home health OT;Supervision/Assistance - 24 hour;Other (comment)([facility])    Equipment Recommendations  Other (comment)(air mattress for hospital bed to help prevent bed sores)       Precautions / Restrictions Precautions Precautions: Fall Precaution Comments: Legally blind; hears better from R ear Restrictions Weight Bearing Restrictions: No       Mobility Bed Mobility Overal bed mobility: Needs Assistance Bed Mobility: Rolling Rolling: Mod assist         General bed mobility comments: VCs to bend up her knees and A to reach for rail first time both sides due to legally blind, but then she was able to roll x5 both directions with VCs and tactile cues to roll her hips over more in the direction she was going           ADL either performed or assessed with clinical judgement   ADL Overall ADL's : Needs assistance/impaired     Grooming: Wash/dry face;Set up;Bed level                                       Vision Baseline Vision/History: Legally blind            Cognition Arousal/Alertness: Awake/alert Behavior During Therapy: WFL for tasks assessed/performed Overall Cognitive Status: Within Functional Limits for tasks assessed                                  General Comments: slow processing, following single step commands        Exercises Other Exercises Other Exercises: 10 reps each of A/AAROM for shoulder flexion and shoulder flexion with abduction supine in bed for ROM and strengthening           Pertinent Vitals/ Pain       Pain Assessment: No/denies pain         Frequency  Min 2X/week        Progress Toward Goals  OT Goals(current goals can now be found in the care plan section)  Progress towards OT goals: Progressing toward goals     Plan Discharge plan remains appropriate       AM-PAC PT "6 Clicks" Daily Activity     Outcome Measure   Help from another person eating meals?: A Little Help from another person taking care of personal grooming?: A Little Help from another person toileting, which includes using toliet, bedpan, or urinal?: Total Help from another person bathing (including washing, rinsing, drying)?: A Lot Help from another person to put on and taking off regular upper body clothing?: A Lot Help from another person to put on and taking off regular lower body clothing?: Total  6 Click Score: 12    End of Session    OT Visit Diagnosis: Other abnormalities of gait and mobility (R26.89);Muscle weakness (generalized) (M62.81)   Activity Tolerance Patient tolerated treatment well   Patient Left in bed;with family/visitor present   Nurse Communication          Time: 7353-2992 OT Time Calculation (min): 28 min  Charges: OT General Charges $OT Visit: 1 Visit OT Treatments $Self Care/Home Management : 8-22 mins $Therapeutic Activity: 8-22 mins  [address], OTR/L Acute [employer] Pager [phone] Office [phone]

## 2017-09-14 ENCOUNTER — Encounter: Payer: Medicare HMO | Admitting: Cardiovascular Disease

## 2017-09-14 DIAGNOSIS — I509 Heart failure, unspecified: Secondary | ICD-10-CM

## 2017-09-14 LAB — BASIC METABOLIC PANEL
ANION GAP: 6 (ref 5–15)
BUN: 41 mg/dL — AB (ref 8–23)
CALCIUM: 8.3 mg/dL — AB (ref 8.9–10.3)
CO2: 26 mmol/L (ref 22–32)
CREATININE: 2.03 mg/dL — AB (ref 0.44–1.00)
Chloride: 105 mmol/L (ref 98–111)
GFR calc Af Amer: 23 mL/min — ABNORMAL LOW (ref >60)
GFR, EST NON AFRICAN AMERICAN: 20 mL/min — AB (ref >60)
GLUCOSE: 143 mg/dL — AB (ref 70–99)
Potassium: 3.5 mmol/L (ref 3.5–5.1)
Sodium: 137 mmol/L (ref 135–145)

## 2017-09-14 LAB — GLUCOSE, CAPILLARY
Glucose-Capillary: 105 mg/dL — ABNORMAL HIGH (ref 70–99)
Glucose-Capillary: 154 mg/dL — ABNORMAL HIGH (ref 70–99)

## 2017-09-14 LAB — CBC
HCT: 28.5 % — ABNORMAL LOW (ref 36.0–46.0)
Hemoglobin: 9.2 g/dL — ABNORMAL LOW (ref 12.0–15.0)
MCH: 27.5 pg (ref 26.0–34.0)
MCHC: 32.3 g/dL (ref 30.0–36.0)
MCV: 85.3 fL (ref 78.0–100.0)
PLATELETS: 204 10*3/uL (ref 150–400)
RBC: 3.34 MIL/uL — ABNORMAL LOW (ref 3.87–5.11)
RDW: 16.2 % — AB (ref 11.5–15.5)
WBC: 8.7 10*3/uL (ref 4.0–10.5)

## 2017-09-14 LAB — PROTIME-INR
INR: 3.18
Prothrombin Time: 32.4 seconds — ABNORMAL HIGH (ref 11.4–15.2)

## 2017-09-14 LAB — MAGNESIUM: Magnesium: 1.8 mg/dL (ref 1.7–2.4)

## 2017-09-14 LAB — IRON AND TIBC
Iron: 15 ug/dL — ABNORMAL LOW (ref 28–170)
SATURATION RATIOS: 9 % — AB (ref 10.4–31.8)
TIBC: 158 ug/dL — AB (ref 250–450)
UIBC: 143 ug/dL

## 2017-09-14 LAB — FOLATE: Folate: 30.2 ng/mL (ref >5.9)

## 2017-09-14 LAB — VITAMIN B12: Vitamin B-12: 123 pg/mL — ABNORMAL LOW (ref 180–914)

## 2017-09-14 LAB — FERRITIN: FERRITIN: 487 ng/mL — AB (ref 11–307)

## 2017-09-14 MED ORDER — TAMSULOSIN HCL 0.4 MG PO CAPS: 0.4000 mg | ORAL_CAPSULE | Freq: Every day | ORAL | 0 refills | Status: DC

## 2017-09-14 NOTE — Discharge Summary (Signed)
Ajaya Crutchfield Quinones FMB:[id number] DOB: December 03, 1920 DOA: 09/10/2017  PCP: [name], MD  Admit date: 09/10/2017  Discharge date: 09/14/2017  Admitted From: Home   Disposition:  Home   Recommendations for Outpatient Follow-up:   Follow up with PCP in 1-2 weeks  PCP Please obtain BMP/CBC, 2 view CXR in 1week,  (see Discharge instructions)   PCP Please follow up on the following pending results:    Home Health: PT,RN   Equipment/Devices: None  Consultations: Pall.Care Discharge Condition: Fair   CODE STATUS: Full   Diet Recommendation: Heart healthy, low carbohydrate  Chief Complaint  Patient presents with  . Shortness of Breath     Brief history of present illness from the day of admission and additional interim summary    84 year old female with history of type 2 diabetes mellitus, heart block with pacer in place, chronic anemia, chronic combined systolic and diastolic CHF, hypothyroidism and chronic kidney disease stage III, who presents to the ED on 8/26 with shortness of breath, cough, nausea and vomiting. This is been going on for a few days. She also reports worsening lower extremity swelling. Patient's daughter is at bedside and she mentions that patient has been having difficulties urinating over the last 3 to 4 days and takes her a long time to pee. She has seen a urologist 1 or 2 weeks ago (daughter is not sure) and at that time she was given Macrobid but daughter is not sure about the details.                                                                 Hospital Course   Acute urinary retentionlikely in the setting ofurinary tract infection with sepsis  - Met sepsis criteria on admission with significantly elevated white count of 17,000, tachycardia, UTI has been adequately treated and now  symptom-free, she required Foley catheterization and Flomax for urinary retention.  Foley has been removed since yesterday afternoon with less than 20 cc of postvoid residual each time, she is now symptom-free eager to go home will be discharged home with home with RN along with Flomax.    Acute on chronic combined systolic and diastolic CHF -Most recent 2D echo dates back to 2017 with an EF of 45 to 50%, Repeat echo on this admission with EF 25-30% (see report) - cardiology was consulted, appreciate recs, she was diuresed treated with IV Lasix, with now will be transition to home dose oral Lasix, currently compensated.  She is now symptom-free with no shortness of breath, will be discharged home with outpatient follow-up with PCP.  Request PCP to monitor BMP, diuretic dose and weight closely.   Type 2 diabetes mellitus -Continue home regimen  Paroxysmal A. Fib,supratherapeutic INR -She is in sinus rhythm, INR supratherapeutic, follow -Admission INR was  over 5 now close to 3, resume Coumadin from tomorrow PCP to monitor and adjust closely  Hypokalemia - improved, follow  Nausea vomiting -seems to be resolved  Normocytic anemia -stable, follow  Leukocytosis: 2/2 above, improved  Hypothyroidism -Continue Synthroid  Anemia: Of chronic disease stable no evidence of bleeding   Discharge diagnosis     Principal Problem:   Acute on chronic combined systolic and diastolic CHF (congestive heart failure) (HCC) Active Problems:   Hypokalemia   Anemia of chronic disease   Nausea & vomiting   PAF (paroxysmal atrial fibrillation) (HCC)   Acquired autoimmune hypothyroidism   Complete heart block (HCC)   Renal insufficiency   SIRS (systemic inflammatory response syndrome) ([place])    Discharge instructions    Discharge Instructions    Discharge instructions   Complete by:  As directed    Start taking Coumadin from tomorrow  Follow with Primary MD [name], MD in 3  days   Get CBC, CMP, INR checked  by Primary MD in 3 days    Activity: As tolerated with Full fall precautions use walker/cane & assistance as needed  Disposition Home    Diet:  Heart Healthy Low Carb  Accuchecks 4 times/day, Once in AM empty stomach and then before each meal. Log in all results and show them to your Prim.MD in 3 days. If any glucose reading is under 80 or above 300 call your Prim MD immidiately. Follow Low glucose instructions for glucose under 80 as instructed.  For Heart failure patients - Check your Weight same time everyday, if you gain over 2 pounds, or you develop in leg swelling, experience more shortness of breath or chest pain, call your Primary MD immediately. Follow Cardiac Low Salt Diet and 1.5 lit/day fluid restriction.  Special Instructions: If you have smoked or chewed Tobacco  in the last 2 yrs please stop smoking, stop any regular Alcohol  and or any Recreational drug use.  On your next visit with your primary care physician please Get Medicines reviewed and adjusted.  Please request your Prim.MD to go over all Hospital Tests and Procedure/Radiological results at the follow up, please get all Hospital records sent to your Prim MD by signing hospital release before you go home.  If you experience worsening of your admission symptoms, develop shortness of breath, life threatening emergency, suicidal or homicidal thoughts you must seek medical attention immediately by calling 911 or calling your MD immediately  if symptoms less severe.  You Must read complete instructions/literature along with all the possible adverse reactions/side effects for all the Medicines you take and that have been prescribed to you. Take any new Medicines after you have completely understood and accpet all the possible adverse reactions/side effects.   Increase activity slowly   Complete by:  As directed       Discharge Medications   Allergies as of 09/14/2017      Reactions     Macrobid [nitrofurantoin] Rash, Other (See Comments), Cough   Wheezing (also) THIS REACTION RESULTED IN THE PATIENT ENDING UP IN THE E.D.   Darvon Other (See Comments)   Causes confusion   Digoxin And Related Nausea Only   Penicillins Other (See Comments)   From childhood: Has patient had a PCN reaction causing immediate rash, facial/tongue/throat swelling, SOB or lightheadedness with hypotension: Unknown Has patient had a PCN reaction causing severe rash involving mucus membranes or skin necrosis: Unknown Has patient had a PCN reaction that required hospitalization: Unknown Has  patient had a PCN reaction occurring within the last 10 years: No If all of the above answers are "NO", then may proceed with Cephalosporin use.   Percocet [oxycodone-acetaminophen] Other (See Comments)   Causes confusion   Percodan [oxycodone-aspirin] Other (See Comments)   Causes confusion   Vicodin [hydrocodone-acetaminophen] Other (See Comments)   Causes confusion      Medication List    TAKE these medications   ACCU-CHEK AVIVA PLUS w/Device Kit Use to check sugars 2 times daily.   ACCU-CHEK FASTCLIX LANCETS Misc Use to check sugar 2 times daily   acetaminophen 500 MG tablet Commonly known as:  TYLENOL Take 500 mg by mouth every 6 (six) hours as needed (pain).   AMITIZA 24 MCG capsule Generic drug:  lubiprostone Take 1 capsule (24 mcg total) by mouth 2 (two) times daily with a meal.   atropine 1 % ophthalmic solution Place 1 drop into both eyes daily as needed (burning eyes).   donepezil 5 MG tablet Commonly known as:  ARICEPT Take 5 mg by mouth at bedtime.   folic acid 1 MG tablet Commonly known as:  FOLVITE TAKE 1 TABLET BY MOUTH EVERY DAY   furosemide 20 MG tablet Commonly known as:  LASIX Take 1 tablet (20 mg total) by mouth daily.   gabapentin 100 MG capsule Commonly known as:  NEURONTIN Take 100 mg by mouth 2 (two) times daily.   glipiZIDE 5 MG tablet Commonly known as:   GLUCOTROL Take 1/2 tablet when blood sugar goes over 200 What changed:  See the new instructions.   ketoconazole 2 % cream Commonly known as:  NIZORAL Apply 1 application topically as needed for irritation.   levalbuterol 0.63 MG/3ML nebulizer solution Commonly known as:  XOPENEX Take 3 mLs by nebulization 3 (three) times daily as needed for wheezing or shortness of breath.   loratadine 10 MG tablet Commonly known as:  CLARITIN Take 10 mg by mouth daily.   magnesium citrate Soln Take 1 Bottle by mouth See admin instructions. Every 7 days as needed for bowel movement.   meclizine 25 MG tablet Commonly known as:  ANTIVERT Take 25 mg by mouth 3 (three) times daily as needed for dizziness.   MUCUS RELIEF CHEST CONGESTION 400 MG Tabs tablet Generic drug:  guaifenesin Take 400 mg by mouth 2 (two) times daily as needed (cough/ congestion).   NUTRITIONAL SUPPLEMENTS PO Enterex Diabetic Nutritional Beverage:   ofloxacin 0.3 % ophthalmic solution Commonly known as:  OCUFLOX Place 1 drop into both eyes 4 (four) times daily as needed (for drainage).   omeprazole 40 MG capsule Commonly known as:  PRILOSEC TAKE 1 CAPSULE BY MOUTH TWICE A DAY   ONETOUCH VERIO test strip Generic drug:  glucose blood USE TO TEST BLOOD SUGAR TWICE A DAY What changed:  See the new instructions.   OVER THE COUNTER MEDICATION Kidney supplement capsules (Choline 100 mg, trimethylglycine 50 mg, glutathione 50 mg, methyltetrahydrofolate, l-arginine-, l-ornithine, l-citruline): Take 1 capsule by mouth two to three times a week   Potassium Chloride CR 8 MEQ Cpcr capsule CR Commonly known as:  MICRO-K Take 1 capsule on Mondays and Thursdays, the days you take your Lasix. What changed:    how much to take  how to take this  when to take this  additional instructions   PROAIR HFA 108 (90 Base) MCG/ACT inhaler Generic drug:  albuterol Inhale 2 puffs into the lungs every 6 (six) hours as needed for  wheezing or shortness  of breath.   REFRESH OP Place 1 drop into both eyes daily as needed (dry eyes).   rosuvastatin 20 MG tablet Commonly known as:  CRESTOR Take 1 tablet (20 mg total) by mouth daily.   SYNTHROID 125 MCG tablet Generic drug:  levothyroxine TAKE 1/2 TABLET BY MOUTH EVERY DAY What changed:    how much to take  when to take this   tamsulosin 0.4 MG Caps capsule Commonly known as:  FLOMAX Take 1 capsule (0.4 mg total) by mouth daily after supper.   traMADol 50 MG tablet Commonly known as:  ULTRAM Take 1 tablet (50 mg total) by mouth 2 (two) times daily as needed for moderate pain.   triamcinolone cream 0.1 % Commonly known as:  KENALOG Apply 1 application topically 2 (two) times daily. Flare up   warfarin 3 MG tablet Commonly known as:  COUMADIN Take as directed. If you are unsure how to take this medication, talk to your nurse or doctor. Original instructions:  TAKE 1 TABLET TO 1 AND 1/2 TABLETS EVERY DAY AS DIRECTED BY COUMADIN CLINIC.   zolpidem 10 MG tablet Commonly known as:  AMBIEN Take 5-10 mg by mouth at bedtime as needed for sleep.            Durable Medical Equipment  (From admission, onward)         Start     Ordered   09/12/17 1148  For home use only DME Other see comment  Once    Comments:  Air mattress for hospital bed   09/12/17 1148          Follow-up Information    [name], MD. Schedule an appointment as soon as possible for a visit in 3 day(s).   Specialty:  Internal Medicine Contact information: [address] [address] [phone]        Sanda Klein, MD .   Specialty:  Cardiology Contact information: [address] [place] [address] [phone]           Major procedures and Radiology Reports - PLEASE review detailed and final reports thoroughly  -         Dg Chest 2 View  Result Date: 09/10/2017 CLINICAL DATA:  Dyspnea. EXAM: CHEST - 2 VIEW  COMPARISON:  Radiographs of July 06, 2017. FINDINGS: Stable cardiomegaly. Status post coronary bypass graft. Left-sided pacemaker is unchanged in position. No pneumothorax or pleural effusion is noted. Mild central pulmonary vascular congestion is noted. No consolidative process is noted. Bony thorax is unremarkable. IMPRESSION: Cardiomegaly with mild central pulmonary vascular congestion. Electronically Signed   By: Marijo Conception, M.D.   On: 09/10/2017 15:21   US Renal  Result Date: 09/13/2017 CLINICAL DATA:  84 year old female with acute kidney injury. EXAM: RENAL / URINARY TRACT ULTRASOUND COMPLETE COMPARISON:  Renal ultrasound 09/09/2016. CT Abdomen and Pelvis 02/04/2014. FINDINGS: Right Kidney: Length: 8.2 centimeters. Cortical echogenicity at the upper limits of normal. Cortical thickness appears stable and within normal limits for age. No right hydronephrosis or renal mass. Left Kidney: Length: 9.9 centimeters. Stable small simple appearing midpole cyst measuring 1.6 centimeters. Left renal cortical thickness and echogenicity are within normal limits for age. No hydronephrosis or solid left renal mass. Bladder: Decompressed, Foley catheter balloon visible. IMPRESSION: Stable and normal for age ultrasound appearance of both kidneys. Electronically Signed   By: Genevie Ann M.D.   On: 09/13/2017 16:29    Micro Results  Recent Results (from the past 240 hour(s))  Culture, blood (routine x 2)     Status: None (Preliminary result)   Collection Time: 09/10/17  3:29 PM  Result Value Ref Range Status   Specimen Description BLOOD RIGHT ANTECUBITAL  Final   Special Requests   Final    BOTTLES DRAWN AEROBIC AND ANAEROBIC Blood Culture adequate volume   Culture   Final    NO GROWTH 3 DAYS Performed at [facility], [address], [place], [place] [address]    Report Status PENDING  Incomplete  Culture, blood (routine x 2)     Status: None (Preliminary result)   Collection Time: 09/10/17   3:29 PM  Result Value Ref Range Status   Specimen Description BLOOD RIGHT HAND  Final   Special Requests   Final    BOTTLES DRAWN AEROBIC AND ANAEROBIC Blood Culture adequate volume   Culture   Final    NO GROWTH 3 DAYS Performed at [facility], [place] [address], [place], [place] [address]    Report Status PENDING  Incomplete  Urine culture     Status: Abnormal   Collection Time: 09/11/17  4:08 AM  Result Value Ref Range Status   Specimen Description URINE, CATHETERIZED  Final   Special Requests   Final    NONE Performed at [facility], [place] [address], [place], [place] [address]    Culture >=100,000 COLONIES/mL LACTOBACILLUS SPECIES (A)  Final   Report Status 09/13/2017 FINAL  Final    Today   Subjective    Airen Ardolino today has no headache,no chest abdominal pain,no new weakness tingling or numbness, feels much better wants to go home today.    Objective   Blood pressure 130/90, pulse 82, temperature 97.9 F (36.6 C), temperature source Oral, resp. rate 14, height _0  (1.6 m), weight 99 kg, SpO2 99 %.   Intake/Output Summary (Last 24 hours) at 09/14/2017 1237 Last data filed at 09/14/2017 1236 Gross per 24 hour  Intake 600 ml  Output 500 ml  Net 100 ml    Exam Awake Alert, Oriented x 2, No new F.N deficits, legally blind at baseline, normal affect .AT,PERRAL Supple Neck,No JVD, No cervical lymphadenopathy appriciated.  Symmetrical Chest wall movement, Good air movement bilaterally, CTAB RRR,No Gallops,Rubs or new Murmurs, No Parasternal Heave +ve B.Sounds, Abd Soft, Non tender, No organomegaly appriciated, No rebound -guarding or rigidity. No Cyanosis, Clubbing or edema, No new Rash or bruise   Data Review   CBC w Diff:  Lab Results  Component Value Date   WBC 8.7 09/14/2017   HGB 9.2 (L) 09/14/2017   HCT 28.5 (L) 09/14/2017   PLT 204 09/14/2017   LYMPHOPCT 9 09/11/2017   MONOPCT 2 09/11/2017   EOSPCT 0 09/11/2017   BASOPCT 0  09/11/2017    CMP:  Lab Results  Component Value Date   NA 137 09/14/2017   NA 146 (H) 04/12/2017   K 3.5 09/14/2017   CL 105 09/14/2017   CO2 26 09/14/2017   BUN 41 (H) 09/14/2017   BUN 13 04/12/2017   CREATININE 2.03 (H) 09/14/2017   CREATININE 1.16 (H) 10/25/2015   PROT 6.3 (L) 09/10/2017   ALBUMIN 2.1 (L) 09/10/2017   BILITOT 0.7 09/10/2017   ALKPHOS 108 09/10/2017   AST 22 09/10/2017   ALT 10 09/10/2017  . Lab Results  Component Value Date   INR 3.18 09/14/2017   INR 3.93 09/13/2017   INR  5.83 (HH) 09/12/2017   Lab Results  Component Value Date   TSH 1.36 05/10/2017     Total Time in preparing paper work, data evaluation and todays exam - 27 minutes  Lala Lund M.D on 09/14/2017 at 12:37 PM  [facility]   Office  [phone]

## 2017-09-14 NOTE — Discharge Instructions (Signed)
Start taking Coumadin from tomorrow  Follow with Primary MD [name], MD in 3 days   Get CBC, CMP, INR checked  by Primary MD in 3 days    Activity: As tolerated with Full fall precautions use walker/cane & assistance as needed  Disposition Home    Diet:  Heart Healthy Low Carb  Accuchecks 4 times/day, Once in AM empty stomach and then before each meal. Log in all results and show them to your Prim.MD in 3 days. If any glucose reading is under 80 or above 300 call your Prim MD immidiately. Follow Low glucose instructions for glucose under 80 as instructed.  For Heart failure patients - Check your Weight same time everyday, if you gain over 2 pounds, or you develop in leg swelling, experience more shortness of breath or chest pain, call your Primary MD immediately. Follow Cardiac Low Salt Diet and 1.5 lit/day fluid restriction.  Special Instructions: If you have smoked or chewed Tobacco  in the last 2 yrs please stop smoking, stop any regular Alcohol  and or any Recreational drug use.  On your next visit with your primary care physician please Get Medicines reviewed and adjusted.  Please request your Prim.MD to go over all Hospital Tests and Procedure/Radiological results at the follow up, please get all Hospital records sent to your Prim MD by signing hospital release before you go home.  If you experience worsening of your admission symptoms, develop shortness of breath, life threatening emergency, suicidal or homicidal thoughts you must seek medical attention immediately by calling 911 or calling your MD immediately  if symptoms less severe.  You Must read complete instructions/literature along with all the possible adverse reactions/side effects for all the Medicines you take and that have been prescribed to you. Take any new Medicines after you have completely understood and accpet all the possible adverse reactions/side effects.

## 2017-09-14 NOTE — Progress Notes (Signed)
Patient discharging home today; orders for resumption of HHC orders faxed to [facility] as requested. [place], MHA,BSN [phone]

## 2017-09-14 NOTE — Plan of Care (Signed)
  Problem: Elimination: Goal: Will not experience complications related to bowel motility Outcome: Progressing   

## 2017-09-14 NOTE — Progress Notes (Addendum)
Daily Progress Note   Patient Name: [name]       Date: 09/14/2017 DOB: 05-02-1920  Age: 84 y.o. MRN#: [id number] Attending Physician: Thurnell Lose, MD Primary Care Physician: [name], MD Admit Date: 09/10/2017  Reason for Consultation/Follow-up: Establishing goals of care  Subjective: Patient sitting in bedside chair. Daughter is finishing phone conversation with insurance company, discussing cost of home health therapies, and addition of OT. [name] has eaten a little less than half of her breakfast. She states she does not like hospital food.   Inquired to follow up on conversation from yesterday regarding scheduling a family meeting today or tomorrow to discuss [facility], as she had requested for multiple specific family members to be present at the conversation. Daughter states her mother hopefully will be going home today.  Discussed difference between palliative and hospice. She states her mother is nowhere near ready for hospice as she is familiar with hospice services.  Discussed outpatient palliative to follow.   Length of Stay: 4  Current Medications: Scheduled Meds:  . donepezil  5 mg Oral QHS  . folic acid  1 mg Oral Daily  . furosemide  20 mg Oral BID  . gabapentin  100 mg Oral BID  . insulin aspart  0-5 Units Subcutaneous QHS  . insulin aspart  0-9 Units Subcutaneous TID WC  . levothyroxine  62.5 mcg Oral QAC breakfast  . lubiprostone  24 mcg Oral BID WC  . pantoprazole  40 mg Oral BID AC  . rosuvastatin  20 mg Oral q1800  . sodium chloride flush  3 mL Intravenous Q12H  . tamsulosin  0.4 mg Oral QPC supper    Continuous Infusions: . sodium chloride      PRN Meds: sodium chloride, acetaminophen, albuterol, atropine, guaiFENesin, meclizine,  ofloxacin, ondansetron (ZOFRAN) IV, sodium chloride flush, traMADol, zolpidem  Physical Exam  Constitutional: No distress.  Pulmonary/Chest: Effort normal.  Neurological: She is alert.            Vital Signs: BP (!) 107/53   Pulse 83   Temp 98.3 F (36.8 C) (Oral)   Resp 18   Ht 5\' 3"  (1.6 m)   Wt 99 kg   SpO2 100%   BMI 38.66 kg/m  SpO2: SpO2: 100 % O2 Device: O2 Device: Room  Air O2 Flow Rate:    Intake/output summary:   Intake/Output Summary (Last 24 hours) at 09/14/2017 1157 Last data filed at 09/13/2017 2230 Gross per 24 hour  Intake 360 ml  Output 500 ml  Net -140 ml   LBM: Last BM Date: 09/13/17 Baseline Weight: Weight: 93.9 kg Most recent weight: Weight: 99 kg       Palliative Assessment/Data:       Patient Active Problem List   Diagnosis Date Noted  . Renal insufficiency 09/10/2017  . SIRS (systemic inflammatory response syndrome) ([place]) 09/10/2017  . History of urinary tract infection 09/20/2016  . Drug rash 09/08/2016  . UTI (urinary tract infection) 09/08/2016  . CAD (coronary artery disease) 02/29/2016  . Hypercholesterolemia 02/29/2016  . Hypertensive heart disease   . NSTEMI (non-ST elevated myocardial infarction) ([place])   . DNR (do not resuscitate) discussion   . Palliative care encounter   . Weakness   . Elevated troponin I level 06/24/2015  . Septic shock ([place]) 03/30/2015  . Syncope 03/30/2015  . Acute UTI   . Transaminitis   . Chronic combined systolic and diastolic CHF (congestive heart failure) ([place])   . Chronic atrial fibrillation ([place])   . Other specified hypothyroidism   . Pressure ulcer 08/17/2014  . Blood poisoning   . Cancer of central portion of left female breast ([place]) 07/30/2014  . Periumbilical abdominal pain 06/26/2014  . Abnormal TSH 05/28/2014  . CKD (chronic kidney disease), stage III ([place]) 05/28/2014  . Acute on chronic combined systolic and diastolic CHF (congestive heart failure) ([place])   . Acute kidney injury  ([place])   . SOB (shortness of breath) 05/24/2014  . Abnormal LFTs (liver function tests)   . Epigastric pain   . Pancreatitis 02/04/2014  . Elevated LFTs   . Complete heart block ([place]) 11/19/2013  . Acquired autoimmune hypothyroidism 10/24/2013  . Arm pain, left 08/24/2013  . Abdominal pain, left lower quadrant 06/30/2013  . Dyspnea 02/23/2013  . PAF (paroxysmal atrial fibrillation) ([place]) 11/19/2012  . History of DVT (deep vein thrombosis) 11/18/2012  . Chest pain with low risk of acute coronary syndrome 11/01/2012  . Hypothyroidism 07/21/2012  . Long term current use of anticoagulant therapy 06/20/2012  . History of pulmonary embolism 2014 06/18/2012  . Fever 02/22/2012  . Malnutrition ([place]) 01/01/2012  . Dyspepsia 12/30/2011  . Acute lower UTI 12/29/2011  . Hiatal hernia 12/29/2011  . Gastric ulcer 12/22/2011  . Fatigue 12/21/2011  . Anemia of chronic disease 12/19/2011  . Nausea & vomiting 12/19/2011  . Anorexia 12/19/2011  . Infection, staphylococcal 12/18/2011  . Hypertension 12/14/2011  . Hypotension 12/14/2011  . Pacemaker 12/14/2011  . Hypokalemia 12/14/2011  . Hyponatremia 12/14/2011  . Microcytic anemia 10/19/2011  . Abnormal weight loss 10/19/2011  . Diabetes mellitus ([place]) 10/19/2011  . Hx of CABG-CABG in 1994. Myoview abnormal Aug 2015- medical Rx 10/19/2011    Palliative Care Assessment & Plan   Patient Profile: 84 year old female with history of type 2 diabetes mellitus, heart block with pacer in place, chronic anemia, chronic combined systolic and diastolic CHF, hypothyroidism and chronic kidney disease stage III, who presents to the ED on 8/26 with shortness of breath, cough, nausea and vomiting.    Recommendations/Plan:  Recommend outpatient palliative to follow.   Code Status:    Code Status Orders  (From admission, onward)         Start     Ordered   09/13/17 0958  Full code  Continuous  09/13/17 0958        Code Status History     Date Active Date Inactive Code Status Order ID Comments User Context   09/10/2017 1957 09/13/2017 0958 Partial Code [id number]  Vianne Bulls, MD ED   09/08/2016 2032 09/11/2016 2131 Partial Code [id number]  Vianne Bulls, MD ED   06/30/2015 1343 07/01/2015 2240 Partial Code [id number]  Knox Royalty, NP Inpatient   06/24/2015 2027 06/30/2015 1343 Full Code [id number]  Mendel Corning, MD Inpatient   03/30/2015 0131 03/31/2015 1846 Full Code [id number]  Norval Morton, MD ED   08/16/2014 1827 08/22/2014 1641 Full Code [id number]  Theodis Blaze, MD Inpatient   05/24/2014 0907 05/29/2014 1909 Full Code [id number]  Theressa Millard, MD Inpatient   05/24/2014 0900 05/24/2014 0907 Full Code [id number]  Theressa Millard, MD Inpatient   02/04/2014 1755 02/07/2014 1607 Partial Code [id number]  Theodis Blaze, MD Inpatient   02/04/2014 1736 02/04/2014 1755 Full Code [id number]  Theodis Blaze, MD Inpatient   11/18/2012 2124 11/21/2012 2237 Partial Code [id number]  Toy Baker, MD Inpatient   06/15/2012 1413 06/19/2012 1749 Full Code [id number]  Hosie Poisson, MD Inpatient   02/22/2012 2218 02/26/2012 1830 Full Code [id number]  Malka So, RN ED   12/29/2011 1749 01/03/2012 2139 Full Code [id number]  [place], RN Inpatient   12/21/2011 2014 12/26/2011 2357 Full Code [id number]  Weston Settle, NP Inpatient    Advance Directive Documentation     Most Recent Value  Type of Advance Directive  Living will, Healthcare Power of Attorney  Pre-existing out of facility DNR order (yellow form or pink MOST form)  -  "MOST" Form in Place?  -       Prognosis:  Poor. CKD with CrCl of 18.6. CHF, DM.   Discharge Planning:  Home with Palliative Services    Thank you for allowing the Palliative Medicine Team to assist in the care of this patient.   Total Time 25 min Prolonged Time Billed  no      Greater than 50%  of this time was spent counseling and coordinating care related to the above assessment and  plan.  Asencion Gowda, NP  Please contact Palliative Medicine Team phone at [phone] for questions and concerns.

## 2017-09-14 NOTE — Progress Notes (Signed)
Physical Therapy Treatment Patient Details Name: [name] MRN: [id number] DOB: January 18, 1920 Today's Date: 09/14/2017    History of Present Illness 84 year old female with history of type 2 diabetes mellitus, heart block with pacer in place, chronic anemia, chronic combined systolic and diastolic CHF, hypothyroidism and chronic kidney disease stage III, who presents to the ED on 8/26 with shortness of breath, cough, nausea and vomiting. Diagnosed with UTI with sepsis    PT Comments    PT pleasant and [name] reports up a lot last night due to urination. Daughter expressing appreciation for education for mobility and care for pt. Improved ability with bed level transfer and pivot today with use of pad at pelvis for all mobility to assist and control with cues for hand placement for pt and sequence. Encouraged HEP throughout the day.     Follow Up Recommendations  Home health PT;Supervision/Assistance - 24 hour;Other (comment)     Equipment Recommendations  None recommended by PT    Recommendations for Other Services       Precautions / Restrictions Precautions Precautions: Fall Precaution Comments: Legally blind; hears better from R ear    Mobility  Bed Mobility Overal bed mobility: Needs Assistance Bed Mobility: Rolling;Sidelying to Sit Rolling: Mod assist;Max assist Sidelying to sit: Mod assist       General bed mobility comments: mod assist to roll right, max to roll left, cues to bending knee and reaching for rail with hand over hand assist due to vidual deficit. Mod assist for moving legs to EOB and increased time to elevate trunk  Transfers Overall transfer level: Needs assistance   Transfers: Sit to/from Stand;Stand Pivot Transfers Sit to Stand: Max assist Stand pivot transfers: Max assist       General transfer comment: pt able to stand from bed and recliner with max assist with cues for hand placement on therapist arms x 2 trials. Use of bed pad to  control and elevate pelvis each trial with bil knees blocked and cues for sequence. With pivot increased time to step but pt advancing legs to step. Max assist to fully step back to chair with LLE  Ambulation/Gait             General Gait Details: unable   Stairs             Wheelchair Mobility    Modified Rankin (Stroke Patients Only)       Balance Overall balance assessment: Needs assistance   Sitting balance-Leahy Scale: Fair Sitting balance - Comments: Static sitting at EOB with Min Guard A for safety     Standing balance-Leahy Scale: Poor Standing balance comment: bil UE support in standing                            Cognition Arousal/Alertness: Awake/alert Behavior During Therapy: WFL for tasks assessed/performed Overall Cognitive Status: Within Functional Limits for tasks assessed                                 General Comments: slow processing, following single step commands      Exercises General Exercises - Lower Extremity Long Arc Quad: AAROM;10 reps;Seated;Both Hip Flexion/Marching: AAROM;10 reps;Seated;Both    General Comments        Pertinent Vitals/Pain Pain Assessment: No/denies pain    Home Living  Prior Function            PT Goals (current goals can now be found in the care plan section) Progress towards PT goals: Progressing toward goals    Frequency    Min 2X/week      PT Plan Current plan remains appropriate;Frequency needs to be updated    Co-evaluation              AM-PAC PT "6 Clicks" Daily Activity  Outcome Measure  Difficulty turning over in bed (including adjusting bedclothes, sheets and blankets)?: Unable Difficulty moving from lying on back to sitting on the side of the bed? : Unable Difficulty sitting down on and standing up from a chair with arms (e.g., wheelchair, bedside commode, etc,.)?: Unable Help needed moving to and from a bed to  chair (including a wheelchair)?: A Lot Help needed walking in hospital room?: Total Help needed climbing 3-5 steps with a railing? : Total 6 Click Score: 7    End of Session Equipment Utilized During Treatment: Gait belt Activity Tolerance: Patient tolerated treatment well Patient left: in chair;with call bell/phone within reach;with chair alarm set;with family/visitor present Nurse Communication: Mobility status;Other (comment)(sequence of stand and pivot with assist of pad at pelvis) PT Visit Diagnosis: Unsteadiness on feet (R26.81);Other abnormalities of gait and mobility (R26.89);Muscle weakness (generalized) (M62.81)     Time: 7858-8502 PT Time Calculation (min) (ACUTE ONLY): 23 min  Charges:  $Therapeutic Exercise: 8-22 mins $Therapeutic Activity: 8-22 mins                     Jude Naclerio Pam Drown, PT Acute Rehabilitation Services Pager: [phone] Office: [place] 09/14/2017, 9:37 AM

## 2017-09-15 LAB — CULTURE, BLOOD (ROUTINE X 2)
Culture: NO GROWTH
Culture: NO GROWTH
Special Requests: ADEQUATE
Special Requests: ADEQUATE

## 2017-09-17 DIAGNOSIS — E119 Type 2 diabetes mellitus without complications: Secondary | ICD-10-CM | POA: Diagnosis not present

## 2017-09-17 DIAGNOSIS — I5032 Chronic diastolic (congestive) heart failure: Secondary | ICD-10-CM | POA: Diagnosis not present

## 2017-09-17 DIAGNOSIS — J45901 Unspecified asthma with (acute) exacerbation: Secondary | ICD-10-CM | POA: Diagnosis not present

## 2017-09-17 DIAGNOSIS — I10 Essential (primary) hypertension: Secondary | ICD-10-CM | POA: Diagnosis not present

## 2017-09-18 ENCOUNTER — Other Ambulatory Visit: Payer: Self-pay | Admitting: Gastroenterology

## 2017-09-18 NOTE — Telephone Encounter (Signed)
Samples of Amitiza 24 mcg left at front desk for patient to pick up. Patient notified and verbalized understanding.

## 2017-09-19 ENCOUNTER — Ambulatory Visit (INDEPENDENT_AMBULATORY_CARE_PROVIDER_SITE_OTHER): Payer: Medicare HMO | Admitting: Pharmacist

## 2017-09-19 DIAGNOSIS — E119 Type 2 diabetes mellitus without complications: Secondary | ICD-10-CM | POA: Diagnosis not present

## 2017-09-19 DIAGNOSIS — J45901 Unspecified asthma with (acute) exacerbation: Secondary | ICD-10-CM | POA: Diagnosis not present

## 2017-09-19 DIAGNOSIS — I5032 Chronic diastolic (congestive) heart failure: Secondary | ICD-10-CM | POA: Diagnosis not present

## 2017-09-19 DIAGNOSIS — Z7901 Long term (current) use of anticoagulants: Secondary | ICD-10-CM | POA: Diagnosis not present

## 2017-09-19 DIAGNOSIS — I10 Essential (primary) hypertension: Secondary | ICD-10-CM | POA: Diagnosis not present

## 2017-09-19 LAB — POCT INR: INR: 2.6 (ref 2.0–3.0)

## 2017-09-21 ENCOUNTER — Telehealth: Payer: Self-pay | Admitting: Cardiology

## 2017-09-21 DIAGNOSIS — E119 Type 2 diabetes mellitus without complications: Secondary | ICD-10-CM | POA: Diagnosis not present

## 2017-09-21 DIAGNOSIS — I5032 Chronic diastolic (congestive) heart failure: Secondary | ICD-10-CM | POA: Diagnosis not present

## 2017-09-21 DIAGNOSIS — J45901 Unspecified asthma with (acute) exacerbation: Secondary | ICD-10-CM | POA: Diagnosis not present

## 2017-09-21 DIAGNOSIS — I10 Essential (primary) hypertension: Secondary | ICD-10-CM | POA: Diagnosis not present

## 2017-09-21 NOTE — Telephone Encounter (Signed)
Patient contacted regarding discharge from [facility] on 09/14/17.  Patient understands to follow up with provider [facility] on 09/26/17 at 1130am at [facility]. Patient understands discharge instructions? YES Patient understands medications and regiment? YES Patient understands to bring all medications to this visit? YES  Questioned if she will need to continue flomax - suggested they speak with Lurena Joiner PA about this at upcoming visit

## 2017-09-21 NOTE — Telephone Encounter (Signed)
TOC [name] Please call [name] Patient has an appointment with Kerin Ransom on 09-26-17.

## 2017-09-22 DIAGNOSIS — I10 Essential (primary) hypertension: Secondary | ICD-10-CM | POA: Diagnosis not present

## 2017-09-22 DIAGNOSIS — J45901 Unspecified asthma with (acute) exacerbation: Secondary | ICD-10-CM | POA: Diagnosis not present

## 2017-09-22 DIAGNOSIS — E119 Type 2 diabetes mellitus without complications: Secondary | ICD-10-CM | POA: Diagnosis not present

## 2017-09-22 DIAGNOSIS — I5032 Chronic diastolic (congestive) heart failure: Secondary | ICD-10-CM | POA: Diagnosis not present

## 2017-09-24 DIAGNOSIS — I10 Essential (primary) hypertension: Secondary | ICD-10-CM | POA: Diagnosis not present

## 2017-09-24 DIAGNOSIS — E119 Type 2 diabetes mellitus without complications: Secondary | ICD-10-CM | POA: Diagnosis not present

## 2017-09-24 DIAGNOSIS — I5032 Chronic diastolic (congestive) heart failure: Secondary | ICD-10-CM | POA: Diagnosis not present

## 2017-09-24 DIAGNOSIS — J45901 Unspecified asthma with (acute) exacerbation: Secondary | ICD-10-CM | POA: Diagnosis not present

## 2017-09-25 DIAGNOSIS — I5032 Chronic diastolic (congestive) heart failure: Secondary | ICD-10-CM | POA: Diagnosis not present

## 2017-09-25 DIAGNOSIS — J45901 Unspecified asthma with (acute) exacerbation: Secondary | ICD-10-CM | POA: Diagnosis not present

## 2017-09-25 DIAGNOSIS — I10 Essential (primary) hypertension: Secondary | ICD-10-CM | POA: Diagnosis not present

## 2017-09-25 DIAGNOSIS — E119 Type 2 diabetes mellitus without complications: Secondary | ICD-10-CM | POA: Diagnosis not present

## 2017-09-26 ENCOUNTER — Ambulatory Visit: Payer: Medicare HMO | Admitting: Cardiology

## 2017-09-26 ENCOUNTER — Ambulatory Visit (INDEPENDENT_AMBULATORY_CARE_PROVIDER_SITE_OTHER): Payer: Medicare HMO | Admitting: Pharmacist

## 2017-09-26 ENCOUNTER — Encounter: Payer: Self-pay | Admitting: Cardiology

## 2017-09-26 DIAGNOSIS — I5043 Acute on chronic combined systolic (congestive) and diastolic (congestive) heart failure: Secondary | ICD-10-CM

## 2017-09-26 DIAGNOSIS — Z7901 Long term (current) use of anticoagulants: Secondary | ICD-10-CM | POA: Diagnosis not present

## 2017-09-26 DIAGNOSIS — I5032 Chronic diastolic (congestive) heart failure: Secondary | ICD-10-CM | POA: Diagnosis not present

## 2017-09-26 DIAGNOSIS — J45901 Unspecified asthma with (acute) exacerbation: Secondary | ICD-10-CM | POA: Diagnosis not present

## 2017-09-26 DIAGNOSIS — I951 Orthostatic hypotension: Secondary | ICD-10-CM | POA: Diagnosis not present

## 2017-09-26 DIAGNOSIS — E119 Type 2 diabetes mellitus without complications: Secondary | ICD-10-CM | POA: Diagnosis not present

## 2017-09-26 DIAGNOSIS — I10 Essential (primary) hypertension: Secondary | ICD-10-CM | POA: Diagnosis not present

## 2017-09-26 LAB — POCT INR: INR: 3.8 — AB (ref 2.0–3.0)

## 2017-09-26 NOTE — Patient Instructions (Signed)
Medication Instructions: Your physician recommends that you continue on your current medications as directed. Please refer to the Current Medication list given to you today.   Follow-Up: Keep your scheduled follow up appointment with Dr. Sallyanne Kuster.  Any Other Special Instructions are listed below:  Your Goal Weight is 165-170 lbs. If you notice changes in your weight, please call our office at [phone].   If you need a refill on your cardiac medications before your next appointment, please call your pharmacy.

## 2017-09-26 NOTE — Progress Notes (Signed)
09/26/2017 [name]   22-Feb-1920  [id number]  Primary Physician [name], MD Primary Cardiologist: Dr Sallyanne Kuster  HPI:  Delightful  84 y.o. blind AA female, well known to cardiology, with a hx of CAD s/p CABG 1994, chronic combined systolic and diastolic heart failure, paroxysmal atrial fibrillation on coumadin, HLD, DM2 with retinopathy and neuropathy, CKD stage IV, and complete heart block s/p dual-chamber PPM (2008, 2017, [employer]). She presented to the ED 09/12/17 with dyspnea. CXR suggested CHF, BNP was 264. She was admitted and gently diuresed. Her hospital weights are not accurate. At home her family has a scale they role her on to and subtract the weight of her wheelchair. Since discharge she has done well. PT works with her. She has chronic LE edema and chronic hypotension but seems to tolerate this well.    Current Outpatient Medications  Medication Sig Dispense Refill  . ACCU-CHEK FASTCLIX LANCETS MISC Use to check sugar 2 times daily 200 each 11  . acetaminophen (TYLENOL) 500 MG tablet Take 500 mg by mouth every 6 (six) hours as needed (pain).    Marland Kitchen albuterol (PROAIR HFA) 108 (90 Base) MCG/ACT inhaler Inhale 2 puffs into the lungs every 6 (six) hours as needed for wheezing or shortness of breath.    . AMITIZA 24 MCG capsule Take 1 capsule (24 mcg total) by mouth 2 (two) times daily with a meal. 60 capsule 3  . atropine 1 % ophthalmic solution Place 1 drop into both eyes daily as needed (burning eyes).   0  . Blood Glucose Monitoring Suppl (ACCU-CHEK AVIVA PLUS) w/Device KIT Use to check sugars 2 times daily. 1 kit 0  . donepezil (ARICEPT) 5 MG tablet Take 5 mg by mouth at bedtime.   0  . folic acid (FOLVITE) 1 MG tablet TAKE 1 TABLET BY MOUTH EVERY DAY (Patient taking differently: Take 1 mg by mouth daily. ) 30 tablet 5  . furosemide (LASIX) 20 MG tablet Take 1 tablet (20 mg total) by mouth daily. 90 tablet 3  . gabapentin (NEURONTIN) 100 MG capsule Take 100 mg by  mouth 2 (two) times daily.     Marland Kitchen glipiZIDE (GLUCOTROL) 5 MG tablet Take 1/2 tablet when blood sugar goes over 200 (Patient taking differently: 2.5 mg. Take 1/2 tablet when blood sugar goes over 200) 45 tablet 1  . guaifenesin (MUCUS RELIEF CHEST CONGESTION) 400 MG TABS tablet Take 400 mg by mouth 2 (two) times daily as needed (cough/ congestion).    Marland Kitchen ketoconazole (NIZORAL) 2 % cream Apply 1 application topically as needed for irritation.   3  . levalbuterol (XOPENEX) 0.63 MG/3ML nebulizer solution Take 3 mLs by nebulization 3 (three) times daily as needed for wheezing or shortness of breath.   0  . loratadine (CLARITIN) 10 MG tablet Take 10 mg by mouth daily.    . magnesium citrate SOLN Take 1 Bottle by mouth See admin instructions. Every 7 days as needed for bowel movement.    . meclizine (ANTIVERT) 25 MG tablet Take 25 mg by mouth 3 (three) times daily as needed for dizziness.    . montelukast (SINGULAIR) 10 MG tablet Take 1 tablet by mouth daily.    Marland Kitchen NUTRITIONAL SUPPLEMENTS PO Enterex Diabetic Nutritional Beverage:    . ofloxacin (OCUFLOX) 0.3 % ophthalmic solution Place 1 drop into both eyes 4 (four) times daily as needed (for drainage).   0  . omeprazole (PRILOSEC) 40 MG capsule TAKE 1 CAPSULE BY MOUTH  TWICE A DAY (Patient taking differently: Take 40 mg by mouth 2 (two) times daily. ) 60 capsule 3  . ONETOUCH VERIO test strip USE TO TEST BLOOD SUGAR TWICE A DAY (Patient taking differently: 1 each by Other route 2 (two) times daily. ) 100 each 3  . OVER THE COUNTER MEDICATION Kidney supplement capsules (Choline 100 mg, trimethylglycine 50 mg, glutathione 50 mg, methyltetrahydrofolate, l-arginine-, l-ornithine, l-citruline): Take 1 capsule by mouth two to three times a week    . Polyvinyl Alcohol-Povidone (REFRESH OP) Place 1 drop into both eyes daily as needed (dry eyes).     . Potassium Chloride CR (MICRO-K) 8 MEQ CPCR capsule CR Take 1 capsule on Mondays and Thursdays, the days you take your  Lasix. (Patient taking differently: Take 8 mEq by mouth daily. ) 90 capsule 3  . rosuvastatin (CRESTOR) 20 MG tablet Take 1 tablet (20 mg total) by mouth daily. 90 tablet 1  . SYNTHROID 125 MCG tablet TAKE 1/2 TABLET BY MOUTH EVERY DAY (Patient taking differently: Take 62.5 mcg by mouth daily before breakfast. ) 45 tablet 2  . tamsulosin (FLOMAX) 0.4 MG CAPS capsule Take 1 capsule (0.4 mg total) by mouth daily after supper. 30 capsule 0  . traMADol (ULTRAM) 50 MG tablet Take 1 tablet (50 mg total) by mouth 2 (two) times daily as needed for moderate pain.    Marland Kitchen triamcinolone cream (KENALOG) 0.1 % Apply 1 application topically 2 (two) times daily. Flare up  1  . warfarin (COUMADIN) 3 MG tablet TAKE 1 TABLET TO 1 AND 1/2 TABLETS EVERY DAY AS DIRECTED BY COUMADIN CLINIC. 140 tablet 1  . zolpidem (AMBIEN) 10 MG tablet Take 5-10 mg by mouth at bedtime as needed for sleep.   0   No current facility-administered medications for this visit.     Allergies  Allergen Reactions  . Macrobid [Nitrofurantoin] Rash, Other (See Comments) and Cough    Wheezing (also) THIS REACTION RESULTED IN THE PATIENT ENDING UP IN THE E.D.  . Darvon Other (See Comments)    Causes confusion  . Digoxin And Related Nausea Only  . Penicillins Other (See Comments)    From childhood: Has patient had a PCN reaction causing immediate rash, facial/tongue/throat swelling, SOB or lightheadedness with hypotension: Unknown Has patient had a PCN reaction causing severe rash involving mucus membranes or skin necrosis: Unknown Has patient had a PCN reaction that required hospitalization: Unknown Has patient had a PCN reaction occurring within the last 10 years: No If all of the above answers are "NO", then may proceed with Cephalosporin use.   Marland Kitchen Percocet [Oxycodone-Acetaminophen] Other (See Comments)    Causes confusion  . Percodan [Oxycodone-Aspirin] Other (See Comments)    Causes confusion  . Vicodin [Hydrocodone-Acetaminophen]  Other (See Comments)    Causes confusion    Past Medical History:  Diagnosis Date  . 2nd degree atrioventricular block    a. 06/22/15 Gen change: MDT ADDR01 Adapta, DC PPM (ser# [id number]).  . Allergy to perfume   . Anemia   . Arthritis   . Chronic combined systolic and diastolic CHF (congestive heart failure) ([place])    a. 01/2014 Echo: EF @ least mod-sev reduced with HK of lat/apical, basal inf walls. basalpost AK, Gr 1DD; b. 06/2015 Echo: EF 45-50%, Gr2 DD, mod LVH.   . CKD (chronic kidney disease), stage III ([place])    a. iii - iv.  . Coronary artery disease    a. 1994 s/p cabg;  b. 09/2013  MV: large, sev intensity, partially reversible inf, apical defect, prior inf/ap infarct w/ mild peri-infarct ischemia->Med Rx; c. 06/2015 NSTEMI (trop 6)->Med rx.  . Daily headache   . DVT of upper extremity (deep vein thrombosis) ([place]) 06/13/2012   BUE  . Dyspepsia   . Gastric ulcer   . GERD (gastroesophageal reflux disease)   . H/O: GI bleed 12//13  . Hiatal hernia   . High cholesterol   . History of blood transfusion 2013  . Hypertensive heart disease   . Hypothyroid   . Legally blind   . PAF (paroxysmal atrial fibrillation) ([facility])    a. CHA2DS2VASc = 7-->coumadin.  . Presence of permanent cardiac pacemaker    a. 06/22/15 Gen change: MDT ADDR01 Adapta, DC PPM (ser# [id number]).  . Seasonal allergies   . Type II diabetes mellitus ([place])   . UTI (lower urinary tract infection)     Social History   Socioeconomic History  . Marital status: Widowed    Spouse name: Not on file  . Number of children: 10  . Years of education: Not on file  . Highest education level: Not on file  Occupational History    Employer: RETIRED  Social Needs  . Financial resource strain: Not on file  . Food insecurity:    Worry: Not on file    Inability: Not on file  . Transportation needs:    Medical: Not on file    Non-medical: Not on file  Tobacco Use  . Smoking status: Never Smoker  . Smokeless tobacco:  Never Used  Substance and Sexual Activity  . Alcohol use: No    Alcohol/week: 0.0 standard drinks  . Drug use: No  . Sexual activity: Never  Lifestyle  . Physical activity:    Days per week: Not on file    Minutes per session: Not on file  . Stress: Not on file  Relationships  . Social connections:    Talks on phone: Not on file    Gets together: Not on file    Attends religious service: Not on file    Active member of club or organization: Not on file    Attends meetings of clubs or organizations: Not on file    Relationship status: Not on file  . Intimate partner violence:    Fear of current or ex partner: Not on file    Emotionally abused: Not on file    Physically abused: Not on file    Forced sexual activity: Not on file  Other Topics Concern  . Not on file  Social History Narrative  . Not on file     Family History  Problem Relation Age of Onset  . Heart disease Father   . Diabetes Father   . Breast cancer Daughter   . Diabetes Son   . Diabetes Daughter   . Colon polyps Daughter   . Heart attack Brother   . Diabetes Brother   . Stroke Sister   . Colon cancer Neg Hx      Review of Systems: General: negative for chills, fever, night sweats or weight changes.  Cardiovascular: negative for chest pain, orthopnea, palpitations, paroxysmal nocturnal dyspnea  Dermatological: negative for rash Respiratory: negative for cough or wheezing Urologic: negative for hematuria Abdominal: negative for nausea, vomiting, diarrhea, bright red blood per rectum, melena, or hematemesis Neurologic: negative for visual changes, syncope, or dizziness All other systems reviewed and are otherwise negative except as noted above.    Blood pressure (!) 76/48,  pulse 76, height '5\' 4"'$  (1.626 m), weight 167 lb (75.8 kg), SpO2 98 %.  General appearance: alert, cooperative, no distress and blind, in wheelchair Lungs: clear to auscultation bilaterally Heart: regular rate and  rhythm Extremities: trace edema Neurologic: Grossly normal  Echo 09/11/17- Study Conclusions  - Left ventricle: The cavity size was normal. Wall thickness was   increased in a pattern of mild LVH. Severe anterior and   anterolateral hypokinesis. Apical septal akinesis, apical   inferior akinesis, akinesis of the true apex. Basal-mid   inferolateral akinesis. Systolic function was severely reduced.   The estimated ejection fraction was in the range of 25% to 30%.   Doppler parameters are consistent with abnormal left ventricular   relaxation (grade 1 diastolic dysfunction). - Aortic valve: Trileaflet; moderately calcified leaflets. There   was no stenosis. There was trivial regurgitation. - Mitral valve: There was mild regurgitation. - Left atrium: The atrium was mildly dilated. - Right ventricle: The cavity size was normal. Systolic function   was mildly reduced. - Tricuspid valve: Peak RV-RA gradient (S): 25 mm Hg. - Pulmonary arteries: PA peak pressure: 28 mm Hg (S). - Inferior vena cava: The vessel was normal in size. The   respirophasic diameter changes were in the normal range (>= 50%),   consistent with normal central venous pressure.  Impressions:  - Normal LV size with mild LV hypertrophy. Multiple wall motion   abnormalities as described above, EF 25-30%. Normal RV size with   mildly decreased systolic function. Mild mitral regurgitation.   ASSESSMENT AND PLAN:   Acute on chronic combined systolic and diastolic CHF (congestive heart failure) ([place]) Pt seen today post hospital. Her weight at home before admission were in the 170's, since discharge 165-168 lbs. Hospital weights recorded were not accurate.   Hypotension Chronic - no symptoms  2Nd degree atrioventricular block MDT PTVDP with generator change June 2017  H/O PAF- chronic Coumadin anticoagulation  Hypotension Chronic- systolic in 27'X  CKD (chronic kidney disease), stage IV Last  GFR-17  Hx of CABG-CABG in 1994- medical Rx NSTEMI 2018- medical Rx  Ischemic cardiomyopathy- Recent echo shows EF of 25-30%  PLAN  Same RX- I told the family to let us know if her weight drops below 165 lbs or above 170 lbs. She has a follow up with Dr Sallyanne Kuster in a few weeks.   Kerin Ransom PA-C 09/26/2017 12:07 PM

## 2017-09-26 NOTE — Assessment & Plan Note (Signed)
Pt seen today post hospital. Her weight at home before admission were in the 170's, since discharge 165-168 lbs. Hospital weights recorded were not accurate.

## 2017-09-26 NOTE — Progress Notes (Signed)
Thank you MCr 

## 2017-09-26 NOTE — Assessment & Plan Note (Signed)
Chronic - no symptoms

## 2017-09-27 ENCOUNTER — Telehealth: Payer: Self-pay

## 2017-09-27 ENCOUNTER — Telehealth: Payer: Self-pay | Admitting: Endocrinology

## 2017-09-27 DIAGNOSIS — I5032 Chronic diastolic (congestive) heart failure: Secondary | ICD-10-CM | POA: Diagnosis not present

## 2017-09-27 DIAGNOSIS — E119 Type 2 diabetes mellitus without complications: Secondary | ICD-10-CM | POA: Diagnosis not present

## 2017-09-27 DIAGNOSIS — J45901 Unspecified asthma with (acute) exacerbation: Secondary | ICD-10-CM | POA: Diagnosis not present

## 2017-09-27 DIAGNOSIS — I10 Essential (primary) hypertension: Secondary | ICD-10-CM | POA: Diagnosis not present

## 2017-09-27 NOTE — Telephone Encounter (Signed)
error 

## 2017-09-27 NOTE — Telephone Encounter (Signed)
Please advise 

## 2017-09-27 NOTE — Telephone Encounter (Signed)
glipiZIDE (GLUCOTROL) 5 MG tablet  Patients daughter stated patient was instructed to take a half a pill when sugar is over 200.  Pt sugar was 220 and they gave her the half.    Daughter would like to know if her sugars are still high tonight should she take another.  Please advise

## 2017-09-27 NOTE — Telephone Encounter (Signed)
Need to know what her blood sugars are for the last 3 days.  Also needs to make follow-up appointment, has not been seen since 4/19

## 2017-09-28 DIAGNOSIS — I10 Essential (primary) hypertension: Secondary | ICD-10-CM | POA: Diagnosis not present

## 2017-09-28 DIAGNOSIS — J45901 Unspecified asthma with (acute) exacerbation: Secondary | ICD-10-CM | POA: Diagnosis not present

## 2017-09-28 DIAGNOSIS — I5032 Chronic diastolic (congestive) heart failure: Secondary | ICD-10-CM | POA: Diagnosis not present

## 2017-09-28 DIAGNOSIS — E119 Type 2 diabetes mellitus without complications: Secondary | ICD-10-CM | POA: Diagnosis not present

## 2017-10-01 ENCOUNTER — Telehealth: Payer: Self-pay | Admitting: Cardiovascular Disease

## 2017-10-01 NOTE — Telephone Encounter (Signed)
New message:      Pt c/o medication issue:  1. Name of Medication: furosemide (LASIX) 20 MG tablet  2. How are you currently taking this medication (dosage and times per day)? Take 1 tablet (20 mg total) by mouth daily.  3. Are you having a reaction (difficulty breathing--STAT)? No  4. What is your medication issue? Pt's legal guardian is calling and needs clarification on how the pt is supposed to take this medication.

## 2017-10-02 ENCOUNTER — Encounter: Payer: Self-pay | Admitting: Internal Medicine

## 2017-10-02 ENCOUNTER — Telehealth: Payer: Self-pay | Admitting: Cardiovascular Disease

## 2017-10-02 DIAGNOSIS — I10 Essential (primary) hypertension: Secondary | ICD-10-CM | POA: Diagnosis not present

## 2017-10-02 DIAGNOSIS — E119 Type 2 diabetes mellitus without complications: Secondary | ICD-10-CM | POA: Diagnosis not present

## 2017-10-02 DIAGNOSIS — J45901 Unspecified asthma with (acute) exacerbation: Secondary | ICD-10-CM | POA: Diagnosis not present

## 2017-10-02 DIAGNOSIS — I5032 Chronic diastolic (congestive) heart failure: Secondary | ICD-10-CM | POA: Diagnosis not present

## 2017-10-02 NOTE — Telephone Encounter (Signed)
noted 

## 2017-10-02 NOTE — Telephone Encounter (Signed)
Returned call to daughter she states that her pacer f/u check up, after hospitalization at 8am on 9-24and this was too early for pt so she rescheduled this but next available appt was 11-11 and she was wondering if this is ok or if it is too late for a follow up? Please advise

## 2017-10-02 NOTE — Telephone Encounter (Signed)
New Message:   Patient has additional questions before appt 11/26/17

## 2017-10-02 NOTE — Telephone Encounter (Signed)
See duplicate telephone message created 10-02-17

## 2017-10-02 NOTE — Telephone Encounter (Signed)
Should be OK 11/11 MCr

## 2017-10-02 NOTE — Telephone Encounter (Signed)
VM is full unable to LVM

## 2017-10-03 ENCOUNTER — Ambulatory Visit (INDEPENDENT_AMBULATORY_CARE_PROVIDER_SITE_OTHER): Payer: Medicare HMO | Admitting: Pharmacist Clinician (PhC)/ Clinical Pharmacy Specialist

## 2017-10-03 DIAGNOSIS — I482 Chronic atrial fibrillation, unspecified: Secondary | ICD-10-CM

## 2017-10-03 DIAGNOSIS — J45901 Unspecified asthma with (acute) exacerbation: Secondary | ICD-10-CM | POA: Diagnosis not present

## 2017-10-03 DIAGNOSIS — I10 Essential (primary) hypertension: Secondary | ICD-10-CM | POA: Diagnosis not present

## 2017-10-03 DIAGNOSIS — Z7901 Long term (current) use of anticoagulants: Secondary | ICD-10-CM | POA: Diagnosis not present

## 2017-10-03 DIAGNOSIS — E119 Type 2 diabetes mellitus without complications: Secondary | ICD-10-CM | POA: Diagnosis not present

## 2017-10-03 DIAGNOSIS — I5032 Chronic diastolic (congestive) heart failure: Secondary | ICD-10-CM | POA: Diagnosis not present

## 2017-10-03 LAB — POCT INR: INR: 3.2 — AB (ref 2.0–3.0)

## 2017-10-04 ENCOUNTER — Telehealth: Payer: Self-pay | Admitting: Gastroenterology

## 2017-10-04 DIAGNOSIS — I5032 Chronic diastolic (congestive) heart failure: Secondary | ICD-10-CM | POA: Diagnosis not present

## 2017-10-04 DIAGNOSIS — J45901 Unspecified asthma with (acute) exacerbation: Secondary | ICD-10-CM | POA: Diagnosis not present

## 2017-10-04 DIAGNOSIS — E119 Type 2 diabetes mellitus without complications: Secondary | ICD-10-CM | POA: Diagnosis not present

## 2017-10-04 DIAGNOSIS — I10 Essential (primary) hypertension: Secondary | ICD-10-CM | POA: Diagnosis not present

## 2017-10-04 NOTE — Telephone Encounter (Signed)
Patient daughter states pt has been taking medication amatiza all week, as suggested by Dr.Nandigam, but her bowels have still not moved and pt daughter wants to know what to do now.

## 2017-10-05 DIAGNOSIS — E119 Type 2 diabetes mellitus without complications: Secondary | ICD-10-CM | POA: Diagnosis not present

## 2017-10-05 DIAGNOSIS — I10 Essential (primary) hypertension: Secondary | ICD-10-CM | POA: Diagnosis not present

## 2017-10-05 DIAGNOSIS — I5032 Chronic diastolic (congestive) heart failure: Secondary | ICD-10-CM | POA: Diagnosis not present

## 2017-10-05 DIAGNOSIS — J45901 Unspecified asthma with (acute) exacerbation: Secondary | ICD-10-CM | POA: Diagnosis not present

## 2017-10-05 NOTE — Telephone Encounter (Signed)
Left message to call.

## 2017-10-08 DIAGNOSIS — H60333 Swimmer's ear, bilateral: Secondary | ICD-10-CM | POA: Diagnosis not present

## 2017-10-09 ENCOUNTER — Telehealth: Payer: Self-pay | Admitting: Cardiovascular Disease

## 2017-10-09 DIAGNOSIS — I5042 Chronic combined systolic (congestive) and diastolic (congestive) heart failure: Secondary | ICD-10-CM

## 2017-10-09 DIAGNOSIS — Z79899 Other long term (current) drug therapy: Secondary | ICD-10-CM

## 2017-10-09 DIAGNOSIS — J45901 Unspecified asthma with (acute) exacerbation: Secondary | ICD-10-CM | POA: Diagnosis not present

## 2017-10-09 DIAGNOSIS — I5032 Chronic diastolic (congestive) heart failure: Secondary | ICD-10-CM | POA: Diagnosis not present

## 2017-10-09 DIAGNOSIS — M7989 Other specified soft tissue disorders: Secondary | ICD-10-CM

## 2017-10-09 DIAGNOSIS — E119 Type 2 diabetes mellitus without complications: Secondary | ICD-10-CM | POA: Diagnosis not present

## 2017-10-09 DIAGNOSIS — I10 Essential (primary) hypertension: Secondary | ICD-10-CM | POA: Diagnosis not present

## 2017-10-09 MED ORDER — FUROSEMIDE 20 MG PO TABS: 40.0000 mg | ORAL_TABLET | ORAL | 3 refills | Status: DC

## 2017-10-09 NOTE — Telephone Encounter (Signed)
Please increase the furosemide to 40 mg twice a week. BMET in 3 weeks please MCr

## 2017-10-09 NOTE — Telephone Encounter (Signed)
SPOKE TO DAUGHTER. SHE STATES PATIENT WEIGHT  HAS BEEN 169 -172,  BLOOD PRESSURE 94/52 TODAY, 107/62  YESTERDAY, 87/62 LAST NIGHT.  THIS WEEK  9/24- 172 LBS 9/23- 171 9/22- 172 9/21- 170 9/20- 170 9/19- 169 9/18- 171 9/17- 171 LBS DAUGHTER STATES SWELLING  LEGS  AND STOMACH - SOME INDENTATION. NO S.O.B. -- CHANGE WITH SLEEP.  PATIENT TAKES 20 MG LASIX TWICE A WEEK - Monday and Thursday. Patient took a dose yesterday.   last office visit 09/26/17 instructed family to call if weight went above 170 lbs  will defer to [facility] and Dr Sallyanne Kuster

## 2017-10-09 NOTE — Telephone Encounter (Signed)
SPOKE DAUGHTER  JOYCE . INSTRUCTION GIVEN.   VERBALIZED UNDERSTANDING  AWARE TO LABS IN 3 WEEKS  DAUGHTER  STATES SHE HAS 40 MG  TABLETS AWARE TO LABS DONE 3 WEEKS

## 2017-10-09 NOTE — Telephone Encounter (Signed)
New message  Pt c/o swelling: STAT is pt has developed SOB within 24 hours  1) How much weight have you gained and in what time span? 2 lbs in a week  2) If swelling, where is the swelling located? Legs and stomach  3) Are you currently taking a fluid pill? yes  4) Are you currently SOB? No   5) Do you have a log of your daily weights (if so, list)?yes, can provide log if needed   6) Have you gained 3 pounds in a day or 5 pounds in a week? No   7) Have you traveled recently? Yes

## 2017-10-10 ENCOUNTER — Ambulatory Visit (INDEPENDENT_AMBULATORY_CARE_PROVIDER_SITE_OTHER): Payer: Medicare HMO | Admitting: Pharmacist

## 2017-10-10 ENCOUNTER — Encounter: Payer: Medicare HMO | Admitting: Cardiovascular Disease

## 2017-10-10 DIAGNOSIS — Z7901 Long term (current) use of anticoagulants: Secondary | ICD-10-CM

## 2017-10-10 DIAGNOSIS — I5032 Chronic diastolic (congestive) heart failure: Secondary | ICD-10-CM | POA: Diagnosis not present

## 2017-10-10 DIAGNOSIS — E119 Type 2 diabetes mellitus without complications: Secondary | ICD-10-CM | POA: Diagnosis not present

## 2017-10-10 DIAGNOSIS — J45901 Unspecified asthma with (acute) exacerbation: Secondary | ICD-10-CM | POA: Diagnosis not present

## 2017-10-10 DIAGNOSIS — I10 Essential (primary) hypertension: Secondary | ICD-10-CM | POA: Diagnosis not present

## 2017-10-10 LAB — POCT INR: INR: 3 (ref 2.0–3.0)

## 2017-10-11 DIAGNOSIS — J45901 Unspecified asthma with (acute) exacerbation: Secondary | ICD-10-CM | POA: Diagnosis not present

## 2017-10-11 DIAGNOSIS — I10 Essential (primary) hypertension: Secondary | ICD-10-CM | POA: Diagnosis not present

## 2017-10-11 DIAGNOSIS — I5032 Chronic diastolic (congestive) heart failure: Secondary | ICD-10-CM | POA: Diagnosis not present

## 2017-10-11 DIAGNOSIS — E119 Type 2 diabetes mellitus without complications: Secondary | ICD-10-CM | POA: Diagnosis not present

## 2017-10-12 ENCOUNTER — Telehealth: Payer: Self-pay | Admitting: Family Medicine

## 2017-10-12 DIAGNOSIS — I5032 Chronic diastolic (congestive) heart failure: Secondary | ICD-10-CM | POA: Diagnosis not present

## 2017-10-12 DIAGNOSIS — J45901 Unspecified asthma with (acute) exacerbation: Secondary | ICD-10-CM | POA: Diagnosis not present

## 2017-10-12 DIAGNOSIS — E119 Type 2 diabetes mellitus without complications: Secondary | ICD-10-CM | POA: Diagnosis not present

## 2017-10-12 DIAGNOSIS — I10 Essential (primary) hypertension: Secondary | ICD-10-CM | POA: Diagnosis not present

## 2017-10-12 NOTE — Telephone Encounter (Signed)
LMOVM that we have been trying to reach her and that we need 3 full days of BS readings and also needs to schedule an OV per Dr. Darin Engels dmf

## 2017-10-12 NOTE — Telephone Encounter (Signed)
Discussed with daughter. On review of labs from previous hospitalization, pt with B12 deficiency Msg sent to PCP Also discussed with daughter.  Pt with appointment on Monday, instructed daughter to write down need to discuss vitamin B12 supplementation with Dr. Marlou Sa.  Also would likely benefit from iron.

## 2017-10-15 ENCOUNTER — Telehealth: Payer: Self-pay | Admitting: Endocrinology

## 2017-10-15 DIAGNOSIS — I10 Essential (primary) hypertension: Secondary | ICD-10-CM | POA: Diagnosis not present

## 2017-10-15 DIAGNOSIS — N189 Chronic kidney disease, unspecified: Secondary | ICD-10-CM | POA: Diagnosis not present

## 2017-10-15 DIAGNOSIS — Z5181 Encounter for therapeutic drug level monitoring: Secondary | ICD-10-CM | POA: Diagnosis not present

## 2017-10-15 NOTE — Telephone Encounter (Signed)
Per [facility] Returning call to office, to see the matter of the call.

## 2017-10-16 ENCOUNTER — Encounter (HOSPITAL_COMMUNITY): Payer: Self-pay | Admitting: Emergency Medicine

## 2017-10-16 ENCOUNTER — Emergency Department (HOSPITAL_COMMUNITY): Payer: Medicare HMO

## 2017-10-16 ENCOUNTER — Inpatient Hospital Stay (HOSPITAL_COMMUNITY)
Admission: EM | Admit: 2017-10-16 | Discharge: 2017-10-19 | DRG: 641 | Disposition: A | Discharge status: Home Health | Payer: Medicare HMO | Attending: Family Medicine | Admitting: Family Medicine

## 2017-10-16 DIAGNOSIS — I5042 Chronic combined systolic (congestive) and diastolic (congestive) heart failure: Secondary | ICD-10-CM | POA: Diagnosis not present

## 2017-10-16 DIAGNOSIS — Z951 Presence of aortocoronary bypass graft: Secondary | ICD-10-CM | POA: Diagnosis not present

## 2017-10-16 DIAGNOSIS — E1122 Type 2 diabetes mellitus with diabetic chronic kidney disease: Secondary | ICD-10-CM | POA: Diagnosis present

## 2017-10-16 DIAGNOSIS — R531 Weakness: Secondary | ICD-10-CM | POA: Diagnosis not present

## 2017-10-16 DIAGNOSIS — Z9841 Cataract extraction status, right eye: Secondary | ICD-10-CM

## 2017-10-16 DIAGNOSIS — I2510 Atherosclerotic heart disease of native coronary artery without angina pectoris: Secondary | ICD-10-CM | POA: Diagnosis present

## 2017-10-16 DIAGNOSIS — Z7901 Long term (current) use of anticoagulants: Secondary | ICD-10-CM

## 2017-10-16 DIAGNOSIS — Z880 Allergy status to penicillin: Secondary | ICD-10-CM

## 2017-10-16 DIAGNOSIS — I482 Chronic atrial fibrillation, unspecified: Secondary | ICD-10-CM | POA: Diagnosis present

## 2017-10-16 DIAGNOSIS — N183 Chronic kidney disease, stage 3 (moderate): Secondary | ICD-10-CM | POA: Diagnosis present

## 2017-10-16 DIAGNOSIS — Z961 Presence of intraocular lens: Secondary | ICD-10-CM | POA: Diagnosis present

## 2017-10-16 DIAGNOSIS — K219 Gastro-esophageal reflux disease without esophagitis: Secondary | ICD-10-CM | POA: Diagnosis not present

## 2017-10-16 DIAGNOSIS — Z8371 Family history of colonic polyps: Secondary | ICD-10-CM

## 2017-10-16 DIAGNOSIS — M1990 Unspecified osteoarthritis, unspecified site: Secondary | ICD-10-CM | POA: Diagnosis present

## 2017-10-16 DIAGNOSIS — R791 Abnormal coagulation profile: Secondary | ICD-10-CM | POA: Diagnosis not present

## 2017-10-16 DIAGNOSIS — Z86711 Personal history of pulmonary embolism: Secondary | ICD-10-CM | POA: Diagnosis not present

## 2017-10-16 DIAGNOSIS — I951 Orthostatic hypotension: Secondary | ICD-10-CM

## 2017-10-16 DIAGNOSIS — Z833 Family history of diabetes mellitus: Secondary | ICD-10-CM

## 2017-10-16 DIAGNOSIS — Z86718 Personal history of other venous thrombosis and embolism: Secondary | ICD-10-CM | POA: Diagnosis not present

## 2017-10-16 DIAGNOSIS — I252 Old myocardial infarction: Secondary | ICD-10-CM | POA: Diagnosis not present

## 2017-10-16 DIAGNOSIS — Z9842 Cataract extraction status, left eye: Secondary | ICD-10-CM | POA: Diagnosis not present

## 2017-10-16 DIAGNOSIS — E876 Hypokalemia: Secondary | ICD-10-CM | POA: Diagnosis not present

## 2017-10-16 DIAGNOSIS — N3090 Cystitis, unspecified without hematuria: Secondary | ICD-10-CM

## 2017-10-16 DIAGNOSIS — K449 Diaphragmatic hernia without obstruction or gangrene: Secondary | ICD-10-CM | POA: Diagnosis present

## 2017-10-16 DIAGNOSIS — Z803 Family history of malignant neoplasm of breast: Secondary | ICD-10-CM

## 2017-10-16 DIAGNOSIS — R69 Illness, unspecified: Secondary | ICD-10-CM | POA: Diagnosis not present

## 2017-10-16 DIAGNOSIS — Z79890 Hormone replacement therapy (postmenopausal): Secondary | ICD-10-CM

## 2017-10-16 DIAGNOSIS — D631 Anemia in chronic kidney disease: Secondary | ICD-10-CM | POA: Diagnosis present

## 2017-10-16 DIAGNOSIS — Z881 Allergy status to other antibiotic agents status: Secondary | ICD-10-CM

## 2017-10-16 DIAGNOSIS — Z7984 Long term (current) use of oral hypoglycemic drugs: Secondary | ICD-10-CM

## 2017-10-16 DIAGNOSIS — E038 Other specified hypothyroidism: Secondary | ICD-10-CM | POA: Diagnosis present

## 2017-10-16 DIAGNOSIS — R339 Retention of urine, unspecified: Secondary | ICD-10-CM | POA: Diagnosis present

## 2017-10-16 DIAGNOSIS — Z950 Presence of cardiac pacemaker: Secondary | ICD-10-CM

## 2017-10-16 DIAGNOSIS — I480 Paroxysmal atrial fibrillation: Secondary | ICD-10-CM | POA: Diagnosis present

## 2017-10-16 DIAGNOSIS — I442 Atrioventricular block, complete: Secondary | ICD-10-CM | POA: Diagnosis not present

## 2017-10-16 DIAGNOSIS — Z885 Allergy status to narcotic agent status: Secondary | ICD-10-CM

## 2017-10-16 DIAGNOSIS — I130 Hypertensive heart and chronic kidney disease with heart failure and stage 1 through stage 4 chronic kidney disease, or unspecified chronic kidney disease: Secondary | ICD-10-CM | POA: Diagnosis not present

## 2017-10-16 DIAGNOSIS — Z8249 Family history of ischemic heart disease and other diseases of the circulatory system: Secondary | ICD-10-CM

## 2017-10-16 DIAGNOSIS — K5900 Constipation, unspecified: Secondary | ICD-10-CM | POA: Diagnosis present

## 2017-10-16 DIAGNOSIS — I441 Atrioventricular block, second degree: Secondary | ICD-10-CM | POA: Diagnosis not present

## 2017-10-16 DIAGNOSIS — Z823 Family history of stroke: Secondary | ICD-10-CM

## 2017-10-16 DIAGNOSIS — E785 Hyperlipidemia, unspecified: Secondary | ICD-10-CM | POA: Diagnosis present

## 2017-10-16 DIAGNOSIS — E7800 Pure hypercholesterolemia, unspecified: Secondary | ICD-10-CM | POA: Diagnosis present

## 2017-10-16 LAB — COMPREHENSIVE METABOLIC PANEL
ALK PHOS: 121 U/L (ref 38–126)
ALT: 9 U/L (ref 0–44)
AST: 21 U/L (ref 15–41)
Albumin: 2.2 g/dL — ABNORMAL LOW (ref 3.5–5.0)
Anion gap: 10 (ref 5–15)
BILIRUBIN TOTAL: 0.6 mg/dL (ref 0.3–1.2)
BUN: 7 mg/dL — ABNORMAL LOW (ref 8–23)
CALCIUM: 8.5 mg/dL — AB (ref 8.9–10.3)
CO2: 29 mmol/L (ref 22–32)
Chloride: 103 mmol/L (ref 98–111)
Creatinine, Ser: 1.42 mg/dL — ABNORMAL HIGH (ref 0.44–1.00)
GFR calc non Af Amer: 30 mL/min — ABNORMAL LOW (ref >60)
GFR, EST AFRICAN AMERICAN: 35 mL/min — AB (ref >60)
Glucose, Bld: 136 mg/dL — ABNORMAL HIGH (ref 70–99)
Potassium: 2.9 mmol/L — ABNORMAL LOW (ref 3.5–5.1)
SODIUM: 142 mmol/L (ref 135–145)
TOTAL PROTEIN: 6.3 g/dL — AB (ref 6.5–8.1)

## 2017-10-16 LAB — PROTIME-INR
INR: 6.83 — AB
PROTHROMBIN TIME: 58.8 s — AB (ref 11.4–15.2)

## 2017-10-16 LAB — URINALYSIS, ROUTINE W REFLEX MICROSCOPIC
Bilirubin Urine: NEGATIVE
Glucose, UA: NEGATIVE mg/dL
Ketones, ur: NEGATIVE mg/dL
Nitrite: NEGATIVE
Protein, ur: NEGATIVE mg/dL
SPECIFIC GRAVITY, URINE: 1.006 (ref 1.005–1.030)
WBC, UA: >50 WBC/hpf — ABNORMAL HIGH (ref 0–5)
pH: 6 (ref 5.0–8.0)

## 2017-10-16 LAB — CBC WITH DIFFERENTIAL/PLATELET
Abs Immature Granulocytes: 0 10*3/uL (ref 0.0–0.1)
BASOS ABS: 0 10*3/uL (ref 0.0–0.1)
Basophils Relative: 1 %
EOS ABS: 0 10*3/uL (ref 0.0–0.7)
EOS PCT: 1 %
HEMATOCRIT: 34.8 % — AB (ref 36.0–46.0)
HEMOGLOBIN: 10.4 g/dL — AB (ref 12.0–15.0)
Immature Granulocytes: 0 %
LYMPHS PCT: 32 %
Lymphs Abs: 2.1 10*3/uL (ref 0.7–4.0)
MCH: 27.3 pg (ref 26.0–34.0)
MCHC: 29.9 g/dL — AB (ref 30.0–36.0)
MCV: 91.3 fL (ref 78.0–100.0)
MONO ABS: 0.4 10*3/uL (ref 0.1–1.0)
Monocytes Relative: 7 %
NEUTROS ABS: 3.9 10*3/uL (ref 1.7–7.7)
NEUTROS PCT: 59 %
Platelets: 149 10*3/uL — ABNORMAL LOW (ref 150–400)
RBC: 3.81 MIL/uL — ABNORMAL LOW (ref 3.87–5.11)
RDW: 15.4 % (ref 11.5–15.5)
WBC: 6.4 10*3/uL (ref 4.0–10.5)

## 2017-10-16 LAB — GLUCOSE, CAPILLARY: Glucose-Capillary: 116 mg/dL — ABNORMAL HIGH (ref 70–99)

## 2017-10-16 LAB — I-STAT CG4 LACTIC ACID, ED: Lactic Acid, Venous: 1.85 mmol/L (ref 0.5–1.9)

## 2017-10-16 MED ORDER — OFLOXACIN 0.3 % OP SOLN: 1.0000 [drp] | Freq: Four times a day (QID) | OPHTHALMIC | Status: DC | PRN

## 2017-10-16 MED ORDER — LEVOTHYROXINE SODIUM 125 MCG PO TABS
62.5000 ug | ORAL_TABLET | Freq: Every day | ORAL | Status: DC
  Administered 2017-10-17 – 2017-10-19 (×3): 62.5 ug via ORAL
  Filled 2017-10-16 (×3): qty 0.5

## 2017-10-16 MED ORDER — SODIUM CHLORIDE 0.9 % IV BOLUS
500.0000 mL | Freq: Once | INTRAVENOUS | Status: AC
  Administered 2017-10-16: 500 mL via INTRAVENOUS

## 2017-10-16 MED ORDER — ACETAMINOPHEN 325 MG PO TABS
650.0000 mg | ORAL_TABLET | Freq: Four times a day (QID) | ORAL | Status: DC | PRN
  Administered 2017-10-17 – 2017-10-19 (×4): 650 mg via ORAL
  Filled 2017-10-16 (×4): qty 2

## 2017-10-16 MED ORDER — ATROPINE SULFATE 1 % OP SOLN: 1.0000 [drp] | Freq: Every day | OPHTHALMIC | Status: DC | PRN

## 2017-10-16 MED ORDER — LUBIPROSTONE 24 MCG PO CAPS
24.0000 ug | ORAL_CAPSULE | Freq: Two times a day (BID) | ORAL | Status: DC
  Administered 2017-10-16 – 2017-10-18 (×4): 24 ug via ORAL
  Filled 2017-10-16 (×4): qty 1

## 2017-10-16 MED ORDER — ENOXAPARIN SODIUM 40 MG/0.4ML ~~LOC~~ SOLN: 40.0000 mg | SUBCUTANEOUS | Status: DC

## 2017-10-16 MED ORDER — GABAPENTIN 300 MG PO CAPS
300.0000 mg | ORAL_CAPSULE | Freq: Two times a day (BID) | ORAL | Status: DC
  Administered 2017-10-16 – 2017-10-19 (×6): 300 mg via ORAL
  Filled 2017-10-16 (×5): qty 1
  Filled 2017-10-16: qty 3

## 2017-10-16 MED ORDER — ALBUTEROL SULFATE (2.5 MG/3ML) 0.083% IN NEBU: 3.0000 mL | INHALATION_SOLUTION | Freq: Four times a day (QID) | RESPIRATORY_TRACT | Status: DC | PRN

## 2017-10-16 MED ORDER — FOLIC ACID 1 MG PO TABS
1.0000 mg | ORAL_TABLET | Freq: Every day | ORAL | Status: DC
  Administered 2017-10-17 – 2017-10-19 (×3): 1 mg via ORAL
  Filled 2017-10-16 (×3): qty 1

## 2017-10-16 MED ORDER — POTASSIUM CHLORIDE CRYS ER 20 MEQ PO TBCR
40.0000 meq | EXTENDED_RELEASE_TABLET | Freq: Once | ORAL | Status: AC
  Administered 2017-10-16: 40 meq via ORAL
  Filled 2017-10-16: qty 2

## 2017-10-16 MED ORDER — DONEPEZIL HCL 5 MG PO TABS: 5.0000 mg | ORAL_TABLET | Freq: Every day | ORAL | Status: DC

## 2017-10-16 MED ORDER — TAMSULOSIN HCL 0.4 MG PO CAPS
0.4000 mg | ORAL_CAPSULE | Freq: Every day | ORAL | Status: DC
  Administered 2017-10-16 – 2017-10-18 (×3): 0.4 mg via ORAL
  Filled 2017-10-16 (×3): qty 1

## 2017-10-16 MED ORDER — ONDANSETRON HCL 4 MG/2ML IJ SOLN
4.0000 mg | Freq: Four times a day (QID) | INTRAMUSCULAR | Status: DC | PRN
  Filled 2017-10-16: qty 2

## 2017-10-16 MED ORDER — MONTELUKAST SODIUM 10 MG PO TABS
10.0000 mg | ORAL_TABLET | Freq: Every day | ORAL | Status: DC
  Administered 2017-10-17 – 2017-10-19 (×3): 10 mg via ORAL
  Filled 2017-10-16 (×3): qty 1

## 2017-10-16 MED ORDER — ONDANSETRON HCL 4 MG PO TABS: 4.0000 mg | ORAL_TABLET | Freq: Four times a day (QID) | ORAL | Status: DC | PRN

## 2017-10-16 MED ORDER — PANTOPRAZOLE SODIUM 40 MG PO TBEC
40.0000 mg | DELAYED_RELEASE_TABLET | Freq: Every day | ORAL | Status: DC
  Administered 2017-10-17 – 2017-10-19 (×3): 40 mg via ORAL
  Filled 2017-10-16 (×3): qty 1

## 2017-10-16 MED ORDER — POTASSIUM CHLORIDE 20 MEQ/15ML (10%) PO SOLN
40.0000 meq | Freq: Once | ORAL | Status: AC
  Administered 2017-10-16: 40 meq via ORAL
  Filled 2017-10-16: qty 30

## 2017-10-16 MED ORDER — GLIPIZIDE 2.5 MG HALF TABLET: 2.5000 mg | ORAL_TABLET | Freq: Every day | ORAL | Status: DC | PRN

## 2017-10-16 MED ORDER — ACETAMINOPHEN 650 MG RE SUPP: 650.0000 mg | Freq: Four times a day (QID) | RECTAL | Status: DC | PRN

## 2017-10-16 MED ORDER — ROSUVASTATIN CALCIUM 20 MG PO TABS
20.0000 mg | ORAL_TABLET | Freq: Every day | ORAL | Status: DC
  Administered 2017-10-16 – 2017-10-18 (×3): 20 mg via ORAL
  Filled 2017-10-16 (×3): qty 1

## 2017-10-16 NOTE — ED Triage Notes (Signed)
Patient brought in my family after seeing PCP yesterday. Reports increased weakness. Labs show INR of 8.1 and daughter states pt has had hypokalemia.

## 2017-10-16 NOTE — ED Notes (Signed)
ED Provider at bedside. 

## 2017-10-16 NOTE — ED Provider Notes (Signed)
[facility] EMERGENCY DEPARTMENT Provider Note   CSN: [id number] Arrival date & time: 10/16/17  1129     History   Chief Complaint Chief Complaint  Patient presents with  . Weakness  . Abnormal Labs    HPI [name] is a 84 y.o. female.  Patient here because her primary care doctor checked her INR yesterday and it was 8.1.  Denies any bleeding.  Denies any symptoms at this time.  Patient has no fever, chills, shortness of breath.  Recently had a urinary tract infection.  Continues to take Coumadin for atrial fibrillation.  No recent falls.  The history is provided by the patient and a caregiver.  Illness  This is a new problem. The current episode started yesterday. The problem occurs constantly. The problem has not changed since onset.Pertinent negatives include no chest pain, no abdominal pain, no headaches and no shortness of breath. Nothing aggravates the symptoms. Nothing relieves the symptoms. The treatment provided no relief.    Past Medical History:  Diagnosis Date  . 2nd degree atrioventricular block    a. 06/22/15 Gen change: MDT ADDR01 Adapta, DC PPM (ser# [id number]).  . Allergy to perfume   . Anemia   . Arthritis   . Chronic combined systolic and diastolic CHF (congestive heart failure) ([place])    a. 01/2014 Echo: EF @ least mod-sev reduced with HK of lat/apical, basal inf walls. basalpost AK, Gr 1DD; b. 06/2015 Echo: EF 45-50%, Gr2 DD, mod LVH.   . CKD (chronic kidney disease), stage III ([place])    a. iii - iv.  . Coronary artery disease    a. 1994 s/p cabg;  b. 09/2013 MV: large, sev intensity, partially reversible inf, apical defect, prior inf/ap infarct w/ mild peri-infarct ischemia->Med Rx; c. 06/2015 NSTEMI (trop 6)->Med rx.  . Daily headache   . DVT of upper extremity (deep vein thrombosis) ([place]) 06/13/2012   BUE  . Dyspepsia   . Gastric ulcer   . GERD (gastroesophageal reflux disease)   . H/O: GI bleed 12//13  . Hiatal hernia   .  High cholesterol   . History of blood transfusion 2013  . Hypertensive heart disease   . Hypothyroid   . Legally blind   . PAF (paroxysmal atrial fibrillation) ([facility])    a. CHA2DS2VASc = 7-->coumadin.  . Presence of permanent cardiac pacemaker    a. 06/22/15 Gen change: MDT ADDR01 Adapta, DC PPM (ser# [id number]).  . Seasonal allergies   . Type II diabetes mellitus ([place])   . UTI (lower urinary tract infection)     Patient Active Problem List   Diagnosis Date Noted  . Renal insufficiency 09/10/2017  . SIRS (systemic inflammatory response syndrome) ([place]) 09/10/2017  . History of urinary tract infection 09/20/2016  . Drug rash 09/08/2016  . UTI (urinary tract infection) 09/08/2016  . CAD (coronary artery disease) 02/29/2016  . Hypercholesterolemia 02/29/2016  . Hypertensive heart disease   . NSTEMI (non-ST elevated myocardial infarction) ([place])   . DNR (do not resuscitate) discussion   . Palliative care encounter   . Weakness   . Elevated troponin I level 06/24/2015  . Septic shock ([place]) 03/30/2015  . Syncope 03/30/2015  . Acute UTI   . Transaminitis   . Chronic combined systolic and diastolic CHF (congestive heart failure) ([place])   . Chronic atrial fibrillation   . Other specified hypothyroidism   . Pressure ulcer 08/17/2014  . Blood poisoning   . Cancer of central portion of  left female breast ([place]) 07/30/2014  . Periumbilical abdominal pain 06/26/2014  . Abnormal TSH 05/28/2014  . CKD (chronic kidney disease), stage III ([place]) 05/28/2014  . Acute on chronic combined systolic and diastolic CHF (congestive heart failure) ([place])   . Acute kidney injury ([place])   . SOB (shortness of breath) 05/24/2014  . Abnormal LFTs (liver function tests)   . Epigastric pain   . Pancreatitis 02/04/2014  . Elevated LFTs   . Complete heart block ([place]) 11/19/2013  . Acquired autoimmune hypothyroidism 10/24/2013  . Arm pain, left 08/24/2013  . Abdominal pain, left lower quadrant 06/30/2013  .  Dyspnea 02/23/2013  . PAF (paroxysmal atrial fibrillation) ([place]) 11/19/2012  . History of DVT (deep vein thrombosis) 11/18/2012  . Chest pain with low risk of acute coronary syndrome 11/01/2012  . Hypothyroidism 07/21/2012  . Long term current use of anticoagulant therapy 06/20/2012  . History of pulmonary embolism 2014 06/18/2012  . Fever 02/22/2012  . Malnutrition ([place]) 01/01/2012  . Dyspepsia 12/30/2011  . Acute lower UTI 12/29/2011  . Hiatal hernia 12/29/2011  . Gastric ulcer 12/22/2011  . Fatigue 12/21/2011  . Anemia of chronic disease 12/19/2011  . Nausea & vomiting 12/19/2011  . Anorexia 12/19/2011  . Infection, staphylococcal 12/18/2011  . Hypertension 12/14/2011  . Hypotension 12/14/2011  . Pacemaker 12/14/2011  . Hypokalemia 12/14/2011  . Hyponatremia 12/14/2011  . Microcytic anemia 10/19/2011  . Abnormal weight loss 10/19/2011  . Diabetes mellitus ([place]) 10/19/2011  . Hx of CABG-CABG in 1994. Myoview abnormal Aug 2015- medical Rx 10/19/2011    Past Surgical History:  Procedure Laterality Date  . CARDIAC CATHETERIZATION  10/25/92  . CARDIAC CATHETERIZATION  11/18/03   w/grafts 100%CX LAD 80 & 100%  . CARDIAC CATHETERIZATION  01/24/05   diffuse disease of native vessels  . CARDIAC CATHETERIZATION  06/06/06   severe native CAD  . CARDIOVERSION  06/06/06   successful  . CATARACT EXTRACTION W/ INTRAOCULAR LENS  IMPLANT, BILATERAL Bilateral   . CORONARY ARTERY BYPASS GRAFT  10/27/92   LIMA to LAD,SVG to LAD second diagonal,obtuse maraginal of the CX and posterior descendingbranch of the RCA  . EP IMPLANTABLE DEVICE N/A 06/22/2015   Procedure: PPM/BIV PPM Generator Changeout;  Surgeon: Sanda Klein, MD;  Location: [facility];  Service: Cardiovascular;  Laterality: N/A;  . ESOPHAGOGASTRODUODENOSCOPY  12/22/2011   Procedure: ESOPHAGOGASTRODUODENOSCOPY (EGD);  Surgeon: Gatha Mayer, MD;  Location: [facility];  Service: Endoscopy;  Laterality: N/A;  . INSERT /  REPLACE / REMOVE PACEMAKER  06/29/2006   Medtronic adapta  . REFRACTIVE SURGERY Bilateral      OB History   None      Home Medications    Prior to Admission medications   Medication Sig Start Date End Date Taking? Authorizing Provider  acetaminophen (TYLENOL) 500 MG tablet Take 500 mg by mouth every 6 (six) hours as needed (pain).   Yes [provider]  albuterol (PROAIR HFA) 108 (90 Base) MCG/ACT inhaler Inhale 2 puffs into the lungs every 6 (six) hours as needed for wheezing or shortness of breath.   Yes [provider]  AMITIZA 24 MCG capsule Take 1 capsule (24 mcg total) by mouth 2 (two) times daily with a meal. 06/04/17  Yes Nandigam, Kavitha V, MD  atropine 1 % ophthalmic solution Place 1 drop into both eyes daily as needed (for burning).  10/29/13  Yes [provider]  folic acid (FOLVITE) 1 MG tablet TAKE 1 TABLET BY MOUTH EVERY  DAY Patient taking differently: Take 1 mg by mouth daily.  09/10/17  Yes Croitoru, Mihai, MD  furosemide (LASIX) 20 MG tablet Take 2 tablets (40 mg total) by mouth 2 (two) times a week. Patient taking differently: Take 40 mg by mouth See admin instructions. Take 40 mg by mouth 2 times a week- Mondays and Thursdays 10/11/17  Yes Croitoru, Mihai, MD  gabapentin (NEURONTIN) 300 MG capsule Take 300 mg by mouth 2 (two) times daily. 10/01/17  Yes [provider]  glipiZIDE (GLUCOTROL) 5 MG tablet Take 1/2 tablet when blood sugar goes over 200 Patient taking differently: Take 2.5 mg by mouth daily as needed (for a BGL of 200 or greater).  09/11/17  Yes Elayne Snare, MD  guaifenesin (MUCUS RELIEF CHEST CONGESTION) 400 MG TABS tablet Take 400 mg by mouth 2 (two) times daily as needed (cough/ congestion).   Yes [provider]  ketoconazole (NIZORAL) 2 % cream Apply 1 application topically as needed (for irritation or breakouts under the breasts).  03/21/17  Yes [provider]  levalbuterol Penne Lash) 0.63 MG/3ML  nebulizer solution Take 3 mLs by nebulization 3 (three) times daily as needed for wheezing or shortness of breath.  09/06/16  Yes [provider]  loratadine (CLARITIN) 10 MG tablet Take 10 mg by mouth daily.   Yes [provider]  magnesium citrate SOLN Take 1 Bottle by mouth once as needed for mild constipation or moderate constipation.    Yes [provider]  meclizine (ANTIVERT) 25 MG tablet Take 25 mg by mouth See admin instructions. Take 25 mg by mouth two times a day, every other day, as needed for vertigo   Yes [provider]  montelukast (SINGULAIR) 10 MG tablet Take 10 mg by mouth daily.  09/20/17  Yes [provider]  NUTRITIONAL SUPPLEMENTS PO Enterex Diabetic Nutritional Beverage: Drink 1 bottle by mouth once a day for supplementation when appetite lessens   Yes [provider]  ofloxacin (OCUFLOX) 0.3 % ophthalmic solution Place 1 drop into both eyes 4 (four) times daily as needed (for drainage).  02/17/16  Yes [provider]  omeprazole (PRILOSEC) 40 MG capsule TAKE 1 CAPSULE BY MOUTH TWICE A DAY Patient taking differently: Take 40 mg by mouth 2 (two) times daily.  09/10/17  Yes Nandigam, Venia Minks, MD  OVER THE COUNTER MEDICATION Kidney supplement capsules (Choline 100 mg, trimethylglycine 50 mg, glutathione 50 mg, methyltetrahydrofolate, l-arginine-, l-ornithine, l-citruline): Take 1 capsule by mouth two to three times a week   Yes [provider]  Polyvinyl Alcohol-Povidone (REFRESH OP) Place 1 drop into both eyes daily as needed (dry eyes).    Yes [provider]  Potassium Chloride CR (MICRO-K) 8 MEQ CPCR capsule CR Take 1 capsule on Mondays and Thursdays, the days you take your Lasix. Patient taking differently: Take 8 mEq by mouth See admin instructions. Take 8 mEq by mouth two times a week- Mondays and Thursdays 04/19/17  Yes Croitoru, Mihai, MD  rosuvastatin (CRESTOR) 20 MG tablet Take 1 tablet (20 mg  total) by mouth daily. Patient taking differently: Take 20 mg by mouth at bedtime.  03/26/17  Yes Croitoru, Mihai, MD  SYNTHROID 125 MCG tablet TAKE 1/2 TABLET BY MOUTH EVERY DAY Patient taking differently: Take 62.5 mcg by mouth daily before breakfast.  06/12/17  Yes Elayne Snare, MD  tamsulosin (FLOMAX) 0.4 MG CAPS capsule Take 1 capsule (0.4 mg total) by mouth daily after supper. 09/14/17  Yes Thurnell Lose,  MD  traMADol (ULTRAM) 50 MG tablet Take 1 tablet (50 mg total) by mouth 2 (two) times daily as needed for moderate pain. 09/11/16  Yes Hongalgi, Lenis Dickinson, MD  triamcinolone cream (KENALOG) 0.1 % Apply 1 application topically 2 (two) times daily as needed (for skin "flares").  03/21/17  Yes [provider]  warfarin (COUMADIN) 3 MG tablet TAKE 1 TABLET TO 1 AND 1/2 TABLETS EVERY DAY AS DIRECTED BY COUMADIN CLINIC. Patient taking differently: Take 3-4.5 mg by mouth See admin instructions. Take 3 mg by mouth at bedtime on Sun/Tues/Wed/Thurs/Fri/Sat and 4.5 mg on Mon 08/18/17  Yes Nila Nephew, MD  zolpidem (AMBIEN) 10 MG tablet Take 5-10 mg by mouth at bedtime as needed for sleep.  08/08/16  Yes [provider]  ACCU-CHEK FASTCLIX LANCETS MISC Use to check sugar 2 times daily 07/17/17   Elayne Snare, MD  Blood Glucose Monitoring Suppl (ACCU-CHEK AVIVA PLUS) w/Device KIT Use to check sugars 2 times daily. 06/21/16   Elayne Snare, MD  donepezil (ARICEPT) 5 MG tablet Take 5 mg by mouth at bedtime.  07/13/14   [provider]  ONETOUCH VERIO test strip USE TO TEST BLOOD SUGAR TWICE A DAY Patient taking differently: 1 each by Other route 2 (two) times daily.  07/10/17   Elayne Snare, MD    Family History Family History  Problem Relation Age of Onset  . Heart disease Father   . Diabetes Father   . Breast cancer Daughter   . Diabetes Son   . Diabetes Daughter   . Colon polyps Daughter   . Heart attack Brother   . Diabetes Brother   . Stroke Sister   . Colon cancer Neg Hx      Social History Social History   Tobacco Use  . Smoking status: Never Smoker  . Smokeless tobacco: Never Used  Substance Use Topics  . Alcohol use: No    Alcohol/week: 0.0 standard drinks  . Drug use: No     Allergies   Macrobid [nitrofurantoin]; Darvon; Digoxin and related; Penicillins; Percocet [oxycodone-acetaminophen]; Percodan [oxycodone-aspirin]; and Vicodin [hydrocodone-acetaminophen]   Review of Systems Review of Systems  Constitutional: Negative for chills and fever.  HENT: Negative for ear pain and sore throat.   Eyes: Negative for pain and visual disturbance.  Respiratory: Negative for cough and shortness of breath.   Cardiovascular: Negative for chest pain and palpitations.  Gastrointestinal: Negative for abdominal pain and vomiting.  Genitourinary: Negative for dysuria and hematuria.  Musculoskeletal: Negative for arthralgias and back pain.  Skin: Negative for color change and rash.  Neurological: Negative for seizures, syncope and headaches.  All other systems reviewed and are negative.    Physical Exam Updated Vital Signs  ED Triage Vitals [10/16/17 1136]  Enc Vitals Group     BP (!) 88/56     Pulse Rate 81     Resp 16     Temp 98.7 F (37.1 C)     Temp Source Oral     SpO2 98 %     Weight      Height      Head Circumference      Peak Flow      Pain Score      Pain Loc      Pain Edu?      Excl. in [place]?     Physical Exam  Constitutional: She is oriented to person, place, and time. She appears well-developed and well-nourished. No distress.  HENT:  Head: Normocephalic and atraumatic.  Eyes: Pupils are equal, round, and reactive to light. Conjunctivae and EOM are normal.  Neck: Normal range of motion. Neck supple.  Cardiovascular: Normal rate, regular rhythm, normal heart sounds and intact distal pulses.  No murmur heard. Pulmonary/Chest: Effort normal and breath sounds normal. No respiratory distress.  Abdominal: Soft. Bowel sounds are  normal. There is no tenderness.  Musculoskeletal: Normal range of motion. She exhibits no edema.  Neurological: She is alert and oriented to person, place, and time.  Skin: Skin is warm and dry. Capillary refill takes less than 2 seconds.  Psychiatric: She has a normal mood and affect.  Nursing note and vitals reviewed.    ED Treatments / Results  Labs (all labs ordered are listed, but only abnormal results are displayed) Labs Reviewed  CBC WITH DIFFERENTIAL/PLATELET - Abnormal; Notable for the following components:      Result Value   RBC 3.81 (*)    Hemoglobin 10.4 (*)    HCT 34.8 (*)    MCHC 29.9 (*)    Platelets 149 (*)    All other components within normal limits  COMPREHENSIVE METABOLIC PANEL - Abnormal; Notable for the following components:   Potassium 2.9 (*)    Glucose, Bld 136 (*)    BUN 7 (*)    Creatinine, Ser 1.42 (*)    Calcium 8.5 (*)    Total Protein 6.3 (*)    Albumin 2.2 (*)    GFR calc non Af Amer 30 (*)    GFR calc Af Amer 35 (*)    All other components within normal limits  PROTIME-INR - Abnormal; Notable for the following components:   Prothrombin Time 58.8 (*)    INR 6.83 (*)    All other components within normal limits  URINALYSIS, ROUTINE W REFLEX MICROSCOPIC - Abnormal; Notable for the following components:   APPearance HAZY (*)    Hgb urine dipstick MODERATE (*)    Leukocytes, UA LARGE (*)    WBC, UA >50 (*)    Bacteria, UA FEW (*)    All other components within normal limits  URINE CULTURE  I-STAT CG4 LACTIC ACID, ED    EKG None  Radiology Dg Chest Portable 1 View  Result Date: 10/16/2017 CLINICAL DATA:  Increase weakness.  Elevated INR.  Hypokalemia. EXAM: PORTABLE CHEST 1 VIEW COMPARISON:  09/10/2017. FINDINGS: Prior CABG. Cardiac pacer with lead tip over the right atrium and right ventricle. Cardiomegaly with normal pulmonary vascularity. No focal infiltrate. Degenerative changes scoliosis thoracic spine. IMPRESSION: 1. Cardiac pacer  with lead tip over the right atrium right ventricle. Prior CABG. Cardiomegaly with normal pulmonary vascularity. Interval resolution of changes of CHF. 2.  No acute pulmonary disease. Electronically Signed   By: [name]  Register   On: 10/16/2017 13:32    Procedures Procedures (including critical care time)  Medications Ordered in ED Medications  sodium chloride 0.9 % bolus 500 mL (0 mLs Intravenous Stopped 10/16/17 1547)  potassium chloride 20 MEQ/15ML (10%) solution 40 mEq (40 mEq Oral Given 10/16/17 1557)     Initial Impression / Assessment and Plan / ED Course  I have reviewed the triage vital signs and the nursing notes.  Pertinent labs & imaging results that were available during my care of the patient were reviewed by me and considered in my medical decision making (see chart for details).     [name] is a 84 year old female with history of hypertension, atrial fibrillation on Coumadin, CAD  who presents to the ED with elevated INR.  Patient with normal vitals.  No fever.  Patient with INR of 8.12 checked yesterday at primary care doctor's office.  Patient has had no bleeding.  No abdominal pain.  Overall well-appearing.  Has no symptoms.  Recently had pneumonia and a urinary tract infection.  She denies any headache, chest pain, shortness of breath.  No abdominal pain on exam.  Exam is overall unremarkable.  Will repeat lab work including urinalysis, chest x-ray.  EKG unremarkable.  INR improved to 6.83.  Mild hypokalemia 2.9 and given oral replacement.  Otherwise lab work unremarkable.  No significant anemia.  No significant leukocytosis.  Lactic acid within normal limits.  Urinalysis showing signs of possible infection.  Patient has had some multiple drug-resistant infections in the past.  She does not specifically state that she has any urinary symptoms.  Her blood pressures have been in the 33L systolic.  She has some general weakness.  Given the constellation of symptoms  with elevated INR, low blood pressure, generalized weakness and possible urinary tract infection patient to be admitted for observation stay with family medicine.  She is high risk for fall and for poor outcome.  Patient to be given antibiotics for urinary tract infection and will continue gentle hydration.  We will follow-up urine culture, replete potassium.  We will continue to hold Coumadin.  Hemodynamically stable throughout my care and transferred to family medicine.   Final Clinical Impressions(s) / ED Diagnoses   Final diagnoses:  Hypokalemia  Generalized weakness  Orthostasis  Cystitis  Elevated INR    ED Discharge Orders    None       Lennice Sites, DO 10/16/17 1710

## 2017-10-16 NOTE — Telephone Encounter (Signed)
Called and infromed pt that we have been trying to contact her per prior telephone encounter on 09/27/17. She said her sugars are under control now. She also is aware that she needs an appointment and will call back to schedule.

## 2017-10-16 NOTE — H&P (Addendum)
[facility] Admission History and Physical Service Pager: [phone]  Patient name: [name] Medical record number: [id number] Date of birth: 09/28/1920 Age: 84 y.o. Gender: female  Primary Care Provider: [name], MD Consultants: None Code Status: partial  Chief Complaint: elevated INR  Assessment and Plan: CACHET MCCUTCHEN is a 84 y.o. female presenting with supratherapeutic INR and hypokalemia . PMH is significant for PAF on warfarin, Chronic combined CHF, Complete Heart block with pacemaker, CAD s/p CABG 1994, T2DM, CKD III, hypothyroidism, HLD.   Supratherapeutic INR INR at PCP yesterday was 8.1, 6.83 today. Takes Warfarin 3 mg QD except Monday when she takes 4.'5mg'$ .  No current signs of bleeding.  Denies hematochezia, hematuria, hemoptysis.  No new confusion.  No bleeding noted on exam. Hgb 10.4, stable. Vitamin K not indicated at this time as she is hemodynamically stable without signs of bleeding and INR <10. - Admit to med-surg, Dr. Lowella Bandy service - monitor vitals per floor protocol - hold warfarin - INR in AM - CBC and BMP in AM - monitor for signs of bleeding - No DVT PPx given high INR and at baseline activity - palliative consult - PT/OT  Hypokalemia Was called by PCP 10/1 and informed that K was 2.7.  K 2.9 in the ED. Given 100mq of K-Dur. EKG paced, appears unchanged from previous.  Daughter reports increase in Lasix dose last week from '20mg'$  2x weekly to '40mg'$  2x weekly.  Takes K repletion at home, but was not increased with Lasix dose. Takes KCl 858m Monday and Thursday with Lasix. Will reach out to Dr. CrBenn Mouldero discuss lasix and potassium repletion regimen. - BMP in AM - repeat K-Dur 4059mthis PM - continue to replete prn - holding home Lasix at present, next dose Thursday - Discuss plan of care with Dr. CroBenn Mouldereakness Questionable history of weakness, acute vs chronic.  "She spends most of her time in bed and  goes places in the wheelchair," per daughter.  Suspect this is more of a chronic issue with possible component of hypotension.  BP on admission 88/56. Given 500m72m bolus in ED. BP 108/70 on exam following bolus. CXR negative for acute pulmonary disease. - palliative consult - PT/OT - monitor vitals - consider gentle fluid rehydration as needed, will hold off now - holding home ambien  Asymptomatic Bacturia UA shows large leukocytes, moderate Hgb, few bacteria, budding yeast, hyaline casts.  Patient denies burning with urination, no AMS, afebrile.  Will not treat as patient is likely colonized with MDR due to advanced age. It is possible this is contributing to her weakness, but this is likely more due to advanced age and disease burden. - monitor for symptoms, vitals - would treat if symptoms occur - f/u Urine Cx  Paroxysmal Atrial Fibrillation CHADSVASc 9.  On warfarin per above.  Not in A fib on exam.  [facility] with [facility]. Will need to have discussion with family regarding risks and benefits of anticoagulation. - holding warfarin per above - goals of care discussion with family  CKD III Cr 1.42 on admission, baseline 1.5-2 on chart review. - f/u BMP  T2DM Last A1c 6.5 04/2017.  On glipizide at home. -cont home glipizide  Complete Heart Block with Pacer Follows with [facility].  Paced on EKG. - monitor vitals  CAD s/p CABG 1994 No chest pain aside from pain at site of pacemaker which is chronic.  Follows with [facility]. On statin and warfarin. - cont  statin - hold warfarin per above  Chronic Combined CHF Follows with [facility].  Last EF 25-30%, G1DD.  No signs of fluid overload on exam.  Trace pitting edema BLE.  No crackles.  Denies SOB. On Lasix '40mg'$  Monday and Thursday with K repletion on same. - holding Lasix in the setting of hypokalemia, next dose Thursday - monitor for signs of fluid overload - cautiously hydrate prn given low EF  Hypothyroidism Last TSH 1.36 in  04/2017.  On synthroif 62.5 mcg QD. - cont home synthroid  HLD Last Lipin Panel in Epic, 2017. On Crestor '20mg'$ . - cont home crestor  GERD On Omeprazole '40mg'$  BID.  Given bleed risk with INR, will hold. - hold home omeprazole  Urinary Retention Chronic, on Flomax 0.'4mg'$ .  Has seen urology per daughter.  Patient reports no current problems with retention. - cont flomax 0.'4mg'$   Constipation On amitiza BID. - cont amitiza  Chronic disease burden Patient likely would benefit from palliative consult during admission to help with goals of care discussion. - palliative consult during admission  FEN/GI: Heart Healthy Carb Modified Prophylaxis: None  Disposition: admit to med-surg  History of Present Illness:  [name] is a 84 y.o. female presenting due to and elevated INR and low potassium. She was seen at her PCP on 09/30. She had bloodwork done which showed an INR of 8.1 and potassium of 2.7.   She was also feeling very weak at that appointment, but daughter also notes that she is generally weak and spends most of the time in her bed aside from when she is getting PT.  She uses a wheelchair to move around. Per her report she has been doing well since she left the hospital around one month ago.  She did notice increased lower extremity swelling last week and called her cardiologist who increased her twice weekly Lasix from '20mg'$  to '40mg'$ .  Her daughter notes that she usually takes potassium with her Lasix, but the dose was not increased when the Lasix was.  Her swelling has improved since this increase.  She denies shortness of breath.  Denies burning with urination, hematuria.  Her daughter reports that she wears Depends.  She reports good BMs with bowel regimen and daughter states that she has not noticed any blood in her depends.  She reports a normal diet without problems.  She also notes 1 episode of non-bloody emesis on 9/30 following some nausea that then resolved and has not  recurred.  Per daughter at her mental baseline she can occasionally forget things but if you talk to her for a while she will remember. Denies new confusion.  Lives with daughter. Daughter is primary caregiver, they have a lot of family support.   Review Of Systems: Per HPI     Patient Active Problem List   Diagnosis Date Noted  . Renal insufficiency 09/10/2017  . SIRS (systemic inflammatory response syndrome) ([place]) 09/10/2017  . History of urinary tract infection 09/20/2016  . Drug rash 09/08/2016  . UTI (urinary tract infection) 09/08/2016  . CAD (coronary artery disease) 02/29/2016  . Hypercholesterolemia 02/29/2016  . Hypertensive heart disease   . NSTEMI (non-ST elevated myocardial infarction) ([place])   . DNR (do not resuscitate) discussion   . Palliative care encounter   . Weakness   . Elevated troponin I level 06/24/2015  . Septic shock ([place]) 03/30/2015  . Syncope 03/30/2015  . Acute UTI   . Transaminitis   . Chronic combined systolic and diastolic CHF (  congestive heart failure) ([place])   . Chronic atrial fibrillation   . Other specified hypothyroidism   . Pressure ulcer 08/17/2014  . Blood poisoning   . Cancer of central portion of left female breast ([place]) 07/30/2014  . Periumbilical abdominal pain 06/26/2014  . Abnormal TSH 05/28/2014  . CKD (chronic kidney disease), stage III ([place]) 05/28/2014  . Acute on chronic combined systolic and diastolic CHF (congestive heart failure) ([place])   . Acute kidney injury ([place])   . SOB (shortness of breath) 05/24/2014  . Abnormal LFTs (liver function tests)   . Epigastric pain   . Pancreatitis 02/04/2014  . Elevated LFTs   . Complete heart block ([place]) 11/19/2013  . Acquired autoimmune hypothyroidism 10/24/2013  . Arm pain, left 08/24/2013  . Abdominal pain, left lower quadrant 06/30/2013  . Dyspnea 02/23/2013  . PAF (paroxysmal atrial fibrillation) ([place]) 11/19/2012  . History of DVT (deep vein thrombosis) 11/18/2012  . Chest  pain with low risk of acute coronary syndrome 11/01/2012  . Hypothyroidism 07/21/2012  . Long term current use of anticoagulant therapy 06/20/2012  . History of pulmonary embolism 2014 06/18/2012  . Fever 02/22/2012  . Malnutrition ([place]) 01/01/2012  . Dyspepsia 12/30/2011  . Acute lower UTI 12/29/2011  . Hiatal hernia 12/29/2011  . Gastric ulcer 12/22/2011  . Fatigue 12/21/2011  . Anemia of chronic disease 12/19/2011  . Nausea & vomiting 12/19/2011  . Anorexia 12/19/2011  . Infection, staphylococcal 12/18/2011  . Hypertension 12/14/2011  . Hypotension 12/14/2011  . Pacemaker 12/14/2011  . Hypokalemia 12/14/2011  . Hyponatremia 12/14/2011  . Microcytic anemia 10/19/2011  . Abnormal weight loss 10/19/2011  . Diabetes mellitus ([place]) 10/19/2011  . Hx of CABG-CABG in 1994. Myoview abnormal Aug 2015- medical Rx 10/19/2011    Past Medical History: Past Medical History:  Diagnosis Date  . 2nd degree atrioventricular block    a. 06/22/15 Gen change: MDT ADDR01 Adapta, DC PPM (ser# [id number]).  . Allergy to perfume   . Anemia   . Arthritis   . Chronic combined systolic and diastolic CHF (congestive heart failure) ([place])    a. 01/2014 Echo: EF @ least mod-sev reduced with HK of lat/apical, basal inf walls. basalpost AK, Gr 1DD; b. 06/2015 Echo: EF 45-50%, Gr2 DD, mod LVH.   . CKD (chronic kidney disease), stage III ([place])    a. iii - iv.  . Coronary artery disease    a. 1994 s/p cabg;  b. 09/2013 MV: large, sev intensity, partially reversible inf, apical defect, prior inf/ap infarct w/ mild peri-infarct ischemia->Med Rx; c. 06/2015 NSTEMI (trop 6)->Med rx.  . Daily headache   . DVT of upper extremity (deep vein thrombosis) ([place]) 06/13/2012   BUE  . Dyspepsia   . Gastric ulcer   . GERD (gastroesophageal reflux disease)   . H/O: GI bleed 12//13  . Hiatal hernia   . High cholesterol   . History of blood transfusion 2013  . Hypertensive heart disease   . Hypothyroid   . Legally blind    . PAF (paroxysmal atrial fibrillation) ([facility])    a. CHA2DS2VASc = 7-->coumadin.  . Presence of permanent cardiac pacemaker    a. 06/22/15 Gen change: MDT ADDR01 Adapta, DC PPM (ser# [id number]).  . Seasonal allergies   . Type II diabetes mellitus ([place])   . UTI (lower urinary tract infection)     Past Surgical History: Past Surgical History:  Procedure Laterality Date  . CARDIAC CATHETERIZATION  10/25/92  . CARDIAC CATHETERIZATION  11/18/03  w/grafts 100%CX LAD 80 & 100%  . CARDIAC CATHETERIZATION  01/24/05   diffuse disease of native vessels  . CARDIAC CATHETERIZATION  06/06/06   severe native CAD  . CARDIOVERSION  06/06/06   successful  . CATARACT EXTRACTION W/ INTRAOCULAR LENS  IMPLANT, BILATERAL Bilateral   . CORONARY ARTERY BYPASS GRAFT  10/27/92   LIMA to LAD,SVG to LAD second diagonal,obtuse maraginal of the CX and posterior descendingbranch of the RCA  . EP IMPLANTABLE DEVICE N/A 06/22/2015   Procedure: PPM/BIV PPM Generator Changeout;  Surgeon: Sanda Klein, MD;  Location: [facility];  Service: Cardiovascular;  Laterality: N/A;  . ESOPHAGOGASTRODUODENOSCOPY  12/22/2011   Procedure: ESOPHAGOGASTRODUODENOSCOPY (EGD);  Surgeon: Gatha Mayer, MD;  Location: [facility];  Service: Endoscopy;  Laterality: N/A;  . INSERT / REPLACE / REMOVE PACEMAKER  06/29/2006   Medtronic adapta  . REFRACTIVE SURGERY Bilateral     Social History: Social History   Tobacco Use  . Smoking status: Never Smoker  . Smokeless tobacco: Never Used  Substance Use Topics  . Alcohol use: No    Alcohol/week: 0.0 standard drinks  . Drug use: No   Please also refer to relevant sections of EMR.  Family History: Family History  Problem Relation Age of Onset  . Heart disease Father   . Diabetes Father   . Breast cancer Daughter   . Diabetes Son   . Diabetes Daughter   . Colon polyps Daughter   . Heart attack Brother   . Diabetes Brother   . Stroke Sister   . Colon cancer Neg Hx      Allergies and Medications: Allergies  Allergen Reactions  . Macrobid [Nitrofurantoin] Rash, Other (See Comments) and Cough    Wheezing (also) THIS REACTION RESULTED IN THE PATIENT ENDING UP IN THE E.D.  . Darvon Other (See Comments)    Causes confusion  . Digoxin And Related Nausea Only  . Penicillins Other (See Comments)    From childhood: Has patient had a PCN reaction causing immediate rash, facial/tongue/throat swelling, SOB or lightheadedness with hypotension: Unknown Has patient had a PCN reaction causing severe rash involving mucus membranes or skin necrosis: Unknown Has patient had a PCN reaction that required hospitalization: Unknown Has patient had a PCN reaction occurring within the last 10 years: No If all of the above answers are "NO", then may proceed with Cephalosporin use.   Marland Kitchen Percocet [Oxycodone-Acetaminophen] Other (See Comments)    Causes confusion  . Percodan [Oxycodone-Aspirin] Other (See Comments)    Causes confusion  . Vicodin [Hydrocodone-Acetaminophen] Other (See Comments)    Causes confusion   No current facility-administered medications on file prior to encounter.    Current Outpatient Medications on File Prior to Encounter  Medication Sig Dispense Refill  . acetaminophen (TYLENOL) 500 MG tablet Take 500 mg by mouth every 6 (six) hours as needed (pain).    Marland Kitchen albuterol (PROAIR HFA) 108 (90 Base) MCG/ACT inhaler Inhale 2 puffs into the lungs every 6 (six) hours as needed for wheezing or shortness of breath.    . AMITIZA 24 MCG capsule Take 1 capsule (24 mcg total) by mouth 2 (two) times daily with a meal. 60 capsule 3  . atropine 1 % ophthalmic solution Place 1 drop into both eyes daily as needed (for burning).   0  . folic acid (FOLVITE) 1 MG tablet TAKE 1 TABLET BY MOUTH EVERY DAY (Patient taking differently: Take 1 mg by mouth daily. ) 30 tablet 5  .  furosemide (LASIX) 20 MG tablet Take 2 tablets (40 mg total) by mouth 2 (two) times a week. (Patient  taking differently: Take 40 mg by mouth See admin instructions. Take 40 mg by mouth 2 times a week- Mondays and Thursdays) 90 tablet 3  . gabapentin (NEURONTIN) 300 MG capsule Take 300 mg by mouth 2 (two) times daily.  2  . glipiZIDE (GLUCOTROL) 5 MG tablet Take 1/2 tablet when blood sugar goes over 200 (Patient taking differently: Take 2.5 mg by mouth daily as needed (for a BGL of 200 or greater). ) 45 tablet 1  . guaifenesin (MUCUS RELIEF CHEST CONGESTION) 400 MG TABS tablet Take 400 mg by mouth 2 (two) times daily as needed (cough/ congestion).    Marland Kitchen ketoconazole (NIZORAL) 2 % cream Apply 1 application topically as needed (for irritation or breakouts under the breasts).   3  . levalbuterol (XOPENEX) 0.63 MG/3ML nebulizer solution Take 3 mLs by nebulization 3 (three) times daily as needed for wheezing or shortness of breath.   0  . loratadine (CLARITIN) 10 MG tablet Take 10 mg by mouth daily.    . magnesium citrate SOLN Take 1 Bottle by mouth once as needed for mild constipation or moderate constipation.     . meclizine (ANTIVERT) 25 MG tablet Take 25 mg by mouth See admin instructions. Take 25 mg by mouth two times a day, every other day, as needed for vertigo    . montelukast (SINGULAIR) 10 MG tablet Take 10 mg by mouth daily.     Marland Kitchen NUTRITIONAL SUPPLEMENTS PO Enterex Diabetic Nutritional Beverage: Drink 1 bottle by mouth once a day for supplementation when appetite lessens    . ofloxacin (OCUFLOX) 0.3 % ophthalmic solution Place 1 drop into both eyes 4 (four) times daily as needed (for drainage).   0  . omeprazole (PRILOSEC) 40 MG capsule TAKE 1 CAPSULE BY MOUTH TWICE A DAY (Patient taking differently: Take 40 mg by mouth 2 (two) times daily. ) 60 capsule 3  . OVER THE COUNTER MEDICATION Kidney supplement capsules (Choline 100 mg, trimethylglycine 50 mg, glutathione 50 mg, methyltetrahydrofolate, l-arginine-, l-ornithine, l-citruline): Take 1 capsule by mouth two to three times a week    . Polyvinyl  Alcohol-Povidone (REFRESH OP) Place 1 drop into both eyes daily as needed (dry eyes).     . Potassium Chloride CR (MICRO-K) 8 MEQ CPCR capsule CR Take 1 capsule on Mondays and Thursdays, the days you take your Lasix. (Patient taking differently: Take 8 mEq by mouth See admin instructions. Take 8 mEq by mouth two times a week- Mondays and Thursdays) 90 capsule 3  . rosuvastatin (CRESTOR) 20 MG tablet Take 1 tablet (20 mg total) by mouth daily. (Patient taking differently: Take 20 mg by mouth at bedtime. ) 90 tablet 1  . SYNTHROID 125 MCG tablet TAKE 1/2 TABLET BY MOUTH EVERY DAY (Patient taking differently: Take 62.5 mcg by mouth daily before breakfast. ) 45 tablet 2  . tamsulosin (FLOMAX) 0.4 MG CAPS capsule Take 1 capsule (0.4 mg total) by mouth daily after supper. 30 capsule 0  . traMADol (ULTRAM) 50 MG tablet Take 1 tablet (50 mg total) by mouth 2 (two) times daily as needed for moderate pain.    Marland Kitchen triamcinolone cream (KENALOG) 0.1 % Apply 1 application topically 2 (two) times daily as needed (for skin "flares").   1  . warfarin (COUMADIN) 3 MG tablet TAKE 1 TABLET TO 1 AND 1/2 TABLETS EVERY DAY AS DIRECTED BY COUMADIN  CLINIC. (Patient taking differently: Take 3-4.5 mg by mouth See admin instructions. Take 3 mg by mouth at bedtime on Sun/Tues/Wed/Thurs/Fri/Sat and 4.5 mg on Mon) 140 tablet 1  . zolpidem (AMBIEN) 10 MG tablet Take 5-10 mg by mouth at bedtime as needed for sleep.   0  . ACCU-CHEK FASTCLIX LANCETS MISC Use to check sugar 2 times daily 200 each 11  . Blood Glucose Monitoring Suppl (ACCU-CHEK AVIVA PLUS) w/Device KIT Use to check sugars 2 times daily. 1 kit 0  . donepezil (ARICEPT) 5 MG tablet Take 5 mg by mouth at bedtime.   0  . ONETOUCH VERIO test strip USE TO TEST BLOOD SUGAR TWICE A DAY (Patient taking differently: 1 each by Other route 2 (two) times daily. ) 100 each 3    Objective: BP 131/75   Pulse 83   Temp 98.7 F (37.1 C) (Oral)   Resp 11   SpO2 98%   Physical  Exam: General: 84 y.o. female in NAD, lying in bed with many blankets up to her chin HEENT: Neck supple, no LAD, MMM, dentures, no bleeding noted in oral cavity, no blood in nares Cardio: RRR no m/r/g Lungs: CTAB, no wheezing, no rhonchi, no crackles Abdomen: Soft, non-tender to palpation, positive bowel sounds Skin: warm and dry Extremities: Trace pitting edema BLE, ganglion cyst of left wrist   Labs and Imaging: CBC BMET  Recent Labs  Lab 10/16/17 1258  WBC 6.4  HGB 10.4*  HCT 34.8*  PLT 149*   Recent Labs  Lab 10/16/17 1258  NA 142  K 2.9*  CL 103  CO2 29  BUN 7*  CREATININE 1.42*  GLUCOSE 136*  CALCIUM 8.5*     Dg Chest Portable 1 View  Result Date: 10/16/2017 CLINICAL DATA:  Increase weakness.  Elevated INR.  Hypokalemia. EXAM: PORTABLE CHEST 1 VIEW COMPARISON:  09/10/2017. FINDINGS: Prior CABG. Cardiac pacer with lead tip over the right atrium and right ventricle. Cardiomegaly with normal pulmonary vascularity. No focal infiltrate. Degenerative changes scoliosis thoracic spine. IMPRESSION: 1. Cardiac pacer with lead tip over the right atrium right ventricle. Prior CABG. Cardiomegaly with normal pulmonary vascularity. Interval resolution of changes of CHF. 2.  No acute pulmonary disease. Electronically Signed   By: [name]  Register   On: 10/16/2017 13:32   [place], Bernita Raisin, DO 10/16/2017, 4:41 PM PGY-1, [place] Intern pager: [phone], text pages welcome ------------------------------------------------------------------------------------------------- Upper Level Addendum: I have seen and evaluated this patient along with Dr. Sandi Carne and reviewed the above note, making necessary revisions in brown.  Guadalupe Dawn MD PGY-2 Family Medicine Resident

## 2017-10-16 NOTE — Progress Notes (Addendum)
Family Medicine Teaching Service Daily Progress Note Intern Pager: [phone]  Patient name: [name] Medical record number: [id number] Date of birth: [birth year] Age: 84 y.o. Gender: female  Primary Care Provider: [name], MD Consultants: None Code Status: DNI  Pt Overview and Major Events to Date:  10/1 Admitted to FPTS  Assessment and Plan: [name] is a 84 y.o. female presenting with supratherapeutic INR and hypokalemia . PMH is significant for PAF on warfarin, Chronic combined CHF, Complete Heart block with pacemaker, CAD s/p CABG 1994, T2DM, CKD III, hypothyroidism, HLD.   Supratherapeutic INR INR this AM 7.32, up from 6.8 on admission.  Hgb this AM 9.7, stable. Continues to show no signs of bleeding with mild hypotension. HASBLED score 2. - cont to hold warfarin - f/u INR in AM - if INR continues to increase, would consider Vitamin K  - monitor for signs of bleeding - No DVT PPx given high INR and at baseline activity - palliative consult - PT/OT - will need to have goals of care discussion with family regarding risks and benefits of anticoagulation - will f/u with her Cardiologist regarding outpatient plan for warfarin  Hypokalemia: Improved Likely 2/2 increased Lasix dose without increase in K repletion.  Received K-Dur 28mEq total yesterday.  K this AM 3.5. - f/u BMP - repeat K-Dur as needed - holding home Lasix at present, next dose Thursday - reach out to Dr. Benn Moulder for outpatient plan for repletion and Lasix dose  Weakness Likely chronic and 2/2 advanced age and disease burden.  Currently getting home health PT 1x weekly and would like 3x weekly.  Also would prefer home with home health, but are not opposed to short term SNF if needed. - palliative consult - PT/OT - monitor vitals - consider gentle fluid rehydration as needed, will hold off now - holding home ambien  Asymptomatic Bacturia Likely colonized with MDR bacteria.   Remains asymptomatic. - monitor for symptoms, vitals - would treat if symptoms occur - f/u Urine Cx  Paroxysmal Atrial Fibrillation CHADSVASc 9.  Not in A Fib at present. - holding warfarin per above - will have goals of care discussion with family prior to d/c  CKD III: Stable Baseline 1.5-2 on chart review. Cr this AM 1.27. - f/u BMP  T2DM: Chronic, well-controlled Last A1c 6.5 04/2017.  On glipizide at home. -cont home glipizide  Complete Heart Block with Pacer. - monitor vitals  CAD s/p CABG 1994 Continues to deny chest pain.  - cont statin - hold warfarin per above  Chronic Combined CHF Last EF 25-30%, G1DD in August 2019. Appears euvolemic on exam. - holding Lasix in the setting of hypokalemia, next dose Thursday - monitor for signs of fluid overload - cautiously hydrate prn given low EF  Hypothyroidism: Chronic Last TSH 1.36 in 04/2017.  On synthroid 62.5 mcg QD. - cont home synthroid  HLD: Chronic  - cont home crestor  GERD: Chronic  On Omeprazole 40mg  BID.   - hold home omeprazole - protonix while inpatient  Urinary Retention: Chronic - cont flomax 0.4mg   Constipation On amitiza BID. - cont amitiza  Chronic disease burden - palliative consult   FEN/GI: Heart Healthy Carb Modified PPx: None given at baseline activity and increased bleeding risk  Disposition: home pending improvement of INR  Subjective:  Patient states that she is feeling well this AM and has no complaints.  Objective: Temp:  [97.2 F (36.2 C)-98.7 F (37.1 C)] 97.2 F (36.2 C) (  10/02 0319) Pulse Rate:  [80-87] 80 (10/02 0946) Resp:  [10-18] 18 (10/02 0319) BP: (88-131)/(47-75) 106/64 (10/02 0946) SpO2:  [98 %-100 %] 99 % (10/02 0946) Weight:  [76.2 kg-76.8 kg] 76.2 kg (10/02 0319)  Physical Exam:  General: 84 y.o. female in NAD HEENT: no blood noted in oral cavity, no blood in nares Cardio: RRR no m/r/g Lungs: CTAB, no wheezing, no rhonchi, no  crackles Abdomen: Soft, non-tender to palpation, positive bowel sounds Skin: warm and dry Extremities: No edema   Laboratory: Recent Labs  Lab 10/16/17 1258 10/17/17 0431  WBC 6.4 5.3  HGB 10.4* 9.7*  HCT 34.8* 31.9*  PLT 149* 137*   Recent Labs  Lab 10/16/17 1258 10/17/17 0431  NA 142 141  K 2.9* 3.5  CL 103 105  CO2 29 29  BUN 7* 7*  CREATININE 1.42* 1.27*  CALCIUM 8.5* 8.2*  PROT 6.3* 5.5*  BILITOT 0.6 0.5  ALKPHOS 121 104  ALT 9 8  AST 21 20  GLUCOSE 136* 150*    Imaging/Diagnostic Tests: Dg Chest Portable 1 View  Result Date: 10/16/2017 CLINICAL DATA:  Increase weakness.  Elevated INR.  Hypokalemia. EXAM: PORTABLE CHEST 1 VIEW COMPARISON:  09/10/2017. FINDINGS: Prior CABG. Cardiac pacer with lead tip over the right atrium and right ventricle. Cardiomegaly with normal pulmonary vascularity. No focal infiltrate. Degenerative changes scoliosis thoracic spine. IMPRESSION: 1. Cardiac pacer with lead tip over the right atrium right ventricle. Prior CABG. Cardiomegaly with normal pulmonary vascularity. Interval resolution of changes of CHF. 2.  No acute pulmonary disease. Electronically Signed   By: [name]  Register   On: 10/16/2017 13:32    [place], Bernita Raisin, DO 10/17/2017, 9:49 AM PGY-1, [place] Intern pager: [phone], text pages welcome

## 2017-10-17 ENCOUNTER — Other Ambulatory Visit: Payer: Self-pay

## 2017-10-17 DIAGNOSIS — R791 Abnormal coagulation profile: Secondary | ICD-10-CM

## 2017-10-17 DIAGNOSIS — R531 Weakness: Secondary | ICD-10-CM

## 2017-10-17 DIAGNOSIS — I951 Orthostatic hypotension: Secondary | ICD-10-CM

## 2017-10-17 LAB — COMPREHENSIVE METABOLIC PANEL
ALT: 8 U/L (ref 0–44)
AST: 20 U/L (ref 15–41)
Albumin: 2 g/dL — ABNORMAL LOW (ref 3.5–5.0)
Alkaline Phosphatase: 104 U/L (ref 38–126)
Anion gap: 7 (ref 5–15)
BUN: 7 mg/dL — ABNORMAL LOW (ref 8–23)
CO2: 29 mmol/L (ref 22–32)
Calcium: 8.2 mg/dL — ABNORMAL LOW (ref 8.9–10.3)
Chloride: 105 mmol/L (ref 98–111)
Creatinine, Ser: 1.27 mg/dL — ABNORMAL HIGH (ref 0.44–1.00)
GFR calc non Af Amer: 35 mL/min — ABNORMAL LOW (ref >60)
GFR, EST AFRICAN AMERICAN: 40 mL/min — AB (ref >60)
Glucose, Bld: 150 mg/dL — ABNORMAL HIGH (ref 70–99)
Potassium: 3.5 mmol/L (ref 3.5–5.1)
Sodium: 141 mmol/L (ref 135–145)
Total Bilirubin: 0.5 mg/dL (ref 0.3–1.2)
Total Protein: 5.5 g/dL — ABNORMAL LOW (ref 6.5–8.1)

## 2017-10-17 LAB — CBC WITH DIFFERENTIAL/PLATELET
Abs Immature Granulocytes: 0 10*3/uL (ref 0.0–0.1)
Basophils Absolute: 0 10*3/uL (ref 0.0–0.1)
Basophils Relative: 0 %
Eosinophils Absolute: 0 10*3/uL (ref 0.0–0.7)
Eosinophils Relative: 1 %
HCT: 31.9 % — ABNORMAL LOW (ref 36.0–46.0)
Hemoglobin: 9.7 g/dL — ABNORMAL LOW (ref 12.0–15.0)
IMMATURE GRANULOCYTES: 0 %
Lymphocytes Relative: 33 %
Lymphs Abs: 1.8 10*3/uL (ref 0.7–4.0)
MCH: 27.7 pg (ref 26.0–34.0)
MCHC: 30.4 g/dL (ref 30.0–36.0)
MCV: 91.1 fL (ref 78.0–100.0)
MONOS PCT: 8 %
Monocytes Absolute: 0.4 10*3/uL (ref 0.1–1.0)
NEUTROS PCT: 58 %
Neutro Abs: 3.1 10*3/uL (ref 1.7–7.7)
Platelets: 137 10*3/uL — ABNORMAL LOW (ref 150–400)
RBC: 3.5 MIL/uL — ABNORMAL LOW (ref 3.87–5.11)
RDW: 15.6 % — AB (ref 11.5–15.5)
WBC: 5.3 10*3/uL (ref 4.0–10.5)

## 2017-10-17 LAB — GLUCOSE, CAPILLARY
GLUCOSE-CAPILLARY: 128 mg/dL — AB (ref 70–99)
Glucose-Capillary: 113 mg/dL — ABNORMAL HIGH (ref 70–99)

## 2017-10-17 LAB — PROTIME-INR
INR: 7.32
Prothrombin Time: 62.1 seconds — ABNORMAL HIGH (ref 11.4–15.2)

## 2017-10-17 MED ORDER — TRAMADOL HCL 50 MG PO TABS
50.0000 mg | ORAL_TABLET | Freq: Two times a day (BID) | ORAL | Status: DC | PRN
  Administered 2017-10-17 – 2017-10-18 (×2): 50 mg via ORAL
  Filled 2017-10-17 (×3): qty 1

## 2017-10-17 MED ORDER — ZOLPIDEM TARTRATE 5 MG PO TABS
5.0000 mg | ORAL_TABLET | Freq: Every evening | ORAL | Status: DC | PRN
  Administered 2017-10-17 – 2017-10-18 (×2): 5 mg via ORAL
  Filled 2017-10-17 (×2): qty 1

## 2017-10-17 NOTE — Care Management Obs Status (Signed)
[place] NOTIFICATION   Patient Details  Name: IARA MONDS MRN: [id number] Date of Birth: 1920/06/22   Medicare Observation Status Notification Given:  Yes    Erenest Rasher, RN 10/17/2017, 6:59 PM

## 2017-10-17 NOTE — Progress Notes (Signed)
Patient is alert and oriented to place, person and situation. Non telemetry. VSS.   High fall risk. Family in room with patient. Patient has no skin breakdown.

## 2017-10-17 NOTE — Progress Notes (Addendum)
FPTS Interim Note  Spoke with Dr. Sallyanne Kuster regarding patient's INR and hypokalemia.  [place] Use: - He states that she has had strokes in the past after being taken off of anticoagulation and would recommend that she continue warfarin therapy as an outpatient. - correction to HASBLED score documented in my progress note today, score of 4.  Hypokalemia - He agrees that likely caused by increased Lasix dose, as there are likely no other causes of her hypokalemia at this time. - Recommends increasing K supplementation with the Lasix doses   Appreciate Dr. Victorino December recs  [name], D.O.  PGY-1 Family Medicine  10/17/2017 3:21 PM

## 2017-10-17 NOTE — Care Management CC44 (Signed)
Condition Code 44 Documentation Completed  Patient Details  Name: [name] MRN: [id number] Date of Birth: Aug 12, 1920   Condition Code 44 given:  Yes Patient signature on Condition Code 44 notice:  Yes Documentation of 2 MD's agreement:  Yes Code 44 added to claim:  Yes    Erenest Rasher, RN 10/17/2017, 6:59 PM

## 2017-10-17 NOTE — Consult Note (Signed)
   [facility] CM Inpatient Consult   10/17/2017  ERLA BACCHI 07/23/1920 [id number]  Patient assessed  for high risk for unplanned readmission  [place] [facility] services with [facility] plan.  Met with patient and daughter, [name],  at the bedside regarding post hospital follow up needs.  She states that has some challenges affording her medications.  She states that one of the medications cost around $200.00 and states, "So, we didn't even try to get that one, but I can't remember the name of it." Chart review reveals and notes: the patient is a 84 y.o. female presenting with supratherapeutic INR and hypokalemia . PMH is significant for PAF on warfarin, Chronic combined CHF, Complete Heart block with pacemaker, CAD s/p CABG 1994, T2DM, CKD III, hypothyroidism, HLD.   Spoke with the daughter about post hospital follow up needs.  She and daughter agrees patient to have follow up with [facility]. She endorses Dr. Kevan Ny as her primary care provider.  Patient has a home health aide M-F a day currently along with PT and OT through [facility].  Given a [facility] brochure with 24 hour nurse advise line and will follow disposition needs.  She was interested in any transportation assistance as the patient is wheelchair bound but unable to roll herself prior to admission.  Will follow for progress and post hospital needs.  For questions contact:   Natividad Brood, RN BSN [facility] Liaison  [phone] business mobile phone Toll free office [phone]

## 2017-10-17 NOTE — Progress Notes (Addendum)
Family Medicine Teaching Service Daily Progress Note Intern Pager: [phone]  Patient name: [name] Medical record number: [id number] Date of birth: 09-May-1920 Age: 84 y.o. Gender: female  Primary Care Provider: [name], MD Consultants: None Code Status: DNI  Pt Overview and Major Events to Date:  10/1 Admitted to FPTS  Assessment and Plan: [name] is a 84 y.o. female presenting with supratherapeutic INR and hypokalemia . PMH is significant for PAF on warfarin, Chronic combined CHF, Complete Heart block with pacemaker, CAD s/p CABG 1994, T2DM, CKD III, hypothyroidism, HLD.   Supratherapeutic INR INR 6.83>7.32>pending this AM.  Patient bleeding from IV site last PM.  No signs of bleeding this AM. - cont to hold warfarin - f/u INR today and in AM - if INR continues to increase, would consider Vitamin K  - monitor for signs of bleeding  - No DVT PPx given high INR and at baseline activity - palliative consult - PT/OT - will cont warfarin outpatient per conversation with Dr. Sallyanne Kuster  Hypokalemia: Improved Dr. Sallyanne Kuster agrees this is likely 2/2 increased Lasix dose.  Recommends increasing K supplementation as outpatient and continuing Lasix.  K this AM pending. - f/u BMP - repeat K-Dur as needed - Lasix dose due today, will administer dose pending K on BMP - cont Lasix as outpatient - increase K supplementation with Lasix to 16 from 8  Weakness PT/OR recommend home health.  Agree to increase frequency at home as family requesting 3x weekly.  This AM states that she feels well and at baseline. - PT/OT consulted - [place] face-to-face ordered - monitor vitals - consider gentle fluid rehydration as needed, will hold off now - ambien restarted as weakness improved and patient requesting  Asymptomatic Bacturia Likely colonized with MDR bacteria.  Asymptomatic. - monitor for symptoms, vitals - would treat if symptoms occur - f/u Urine Cx  Paroxysmal  Atrial Fibrillation CHADSVASc 9.  Not in A Fib at present. - holding warfarin per above - will have goals of care discussion with family prior to d/c  CKD III: Stable Baseline 1.5-2 on chart review. Cr this AM pending. - f/u BMP  T2DM: Chronic, well-controlled Last A1c 6.5 04/2017.  On glipizide at home. -cont home glipizide  Complete Heart Block with Pacer. - monitor vitals  CAD s/p CABG 1994: Stable   - cont statin - hold warfarin per above  Chronic Combined CHF Last EF 25-30%, G1DD in August 2019. Euvolemic on exam. - Lasix dose pending BMP today, per above - monitor for signs of fluid overload - cautiously hydrate prn given low EF  Hypothyroidism: Chronic Last TSH 1.36 in 04/2017.  On synthroid 62.5 mcg QD. - cont home synthroid  HLD: Chronic  - cont home crestor  GERD: Chronic  On Omeprazole 40mg  BID.   - hold home omeprazole - protonix while inpatient  Urinary Retention: Chronic - cont flomax 0.4mg   Constipation On amitiza BID. Noting some diarrhea at present - d/c amitiza for now - consider decreasing dose as outpatient  Chronic disease burden - palliative consult   FEN/GI: Heart Healthy Carb Modified PPx: None given at baseline activity and increased bleeding risk  Disposition: home with home health PT/OT pending improvement of INR  Subjective:  Patient feeling well this AM.  Family inquiring about INR that has not been drawn yet.    Objective: Temp:  [98 F (36.7 C)-98.9 F (37.2 C)] 98.8 F (37.1 C) (10/03 0900) Pulse Rate:  [80-90] 90 (10/03  9597) Resp:  [18-22] 19 (10/03 0900) BP: (91-106)/(55-66) 96/56 (10/03 0900) SpO2:  [97 %-100 %] 100 % (10/03 0900)  Physical Exam: General: 84 y.o. female in NAD Cardio: RRR no m/r/g Lungs: CTAB, no wheezing, no rhonchi, no crackles Abdomen: Soft, non-tender to palpation, positive bowel sounds Skin: warm and dry Extremities: No edema   Laboratory: Recent Labs  Lab 10/16/17 1258  10/17/17 0431  WBC 6.4 5.3  HGB 10.4* 9.7*  HCT 34.8* 31.9*  PLT 149* 137*   Recent Labs  Lab 10/16/17 1258 10/17/17 0431  NA 142 141  K 2.9* 3.5  CL 103 105  CO2 29 29  BUN 7* 7*  CREATININE 1.42* 1.27*  CALCIUM 8.5* 8.2*  PROT 6.3* 5.5*  BILITOT 0.6 0.5  ALKPHOS 121 104  ALT 9 8  AST 21 20  GLUCOSE 136* 150*    Imaging/Diagnostic Tests: No results found.  [place], DO 10/18/2017, 9:29 AM PGY-1, [place] Intern pager: [phone], text pages welcome

## 2017-10-17 NOTE — Evaluation (Signed)
Occupational Therapy Evaluation Patient Details Name: [name] MRN: [id number] DOB: [birth year] Today's Date: 10/17/2017    History of Present Illness 84 y.o. female presenting with supratherapeutic INR and hypokalemia . PMH is significant for PAF on warfarin, Chronic combined CHF, Complete Heart block with pacemaker, CAD s/p CABG 1994, T2DM, CKD III, hypothyroidism, HLD.   Clinical Impression   Pt admitted with the above diagnoses and presents with below problem list. Pt will benefit from continued acute OT to address the below listed deficits and maximize independence with basic ADLs prior to d/c to venue below. PTA pt was needing significant assist for bathing/dressing and toileting tasks. ADLs completed at bed level most days. Able to self-feed with setup, cues, and modified feeding equipment 2/2 vision impairment. Pt's daughter reports she transfers pt herself EOB<>w/c with significant assist (knees buckle) using a modified squat-pivot technique. Daughter reports pt currently only has [facility] aide for 3 hours per week (3 days at 1 hour each visit). Daughter wants to continue to care for pt in the home as long as possible and would greatly benefit from an increase in [facility] aide hours/assistance.      Follow Up Recommendations  Home health OT;Supervision/Assistance - 24 hour;Other (comment)(increase HH Aide hours)    Equipment Recommendations  None recommended by OT    Recommendations for Other Services PT consult     Precautions / Restrictions Precautions Precautions: Fall Restrictions Weight Bearing Restrictions: No      Mobility Bed Mobility Overal bed mobility: Needs Assistance Bed Mobility: Supine to Sit;Sit to Supine     Supine to sit: Max assist;HOB elevated Sit to supine: Mod assist   General bed mobility comments: Some assist to fully positioned BLE off EOB. Pt using therapist arms to powerup to partial EOB postion then bed pad used to pivot hips to full EPB  position. Directional cues for returning to supine, total A to advance BLE onto bed. +2 total to scoot up in bed.  Transfers                      Balance Overall balance assessment: Needs assistance Sitting-balance support: Bilateral upper extremity supported;Feet supported Sitting balance-Leahy Scale: Fair Sitting balance - Comments: able to sit EOB min guard for safety for 3-4 minutes. Fatigued quickly                                   ADL either performed or assessed with clinical judgement   ADL Overall ADL's : Needs assistance/impaired Eating/Feeding: Set up;Bed level   Grooming: Total assistance;Bed level   Upper Body Bathing: Total assistance;Sitting   Lower Body Bathing: Total assistance;Bed level   Upper Body Dressing : Total assistance;Sitting   Lower Body Dressing: Total assistance;Bed level                 General ADL Comments: Pt completed bed mobility and sat EOB about 3-4 minutes. Fatigue noted, increased trunk flexion with prolonged sitting. Therapist visually observed fatigue before pt verbalized it.      Vision Baseline Vision/History: Legally blind Additional Comments: contrast items used for feeding at home (black plate, white fork)     Perception     Praxis      Pertinent Vitals/Pain Pain Assessment: Faces Faces Pain Scale: Hurts a little bit Pain Location: unspecified Pain Descriptors / Indicators: Aching Pain Intervention(s): Monitored during session     Hand  Dominance Right   Extremity/Trunk Assessment Upper Extremity Assessment Upper Extremity Assessment: Generalized weakness   Lower Extremity Assessment Lower Extremity Assessment: Defer to PT evaluation   Cervical / Trunk Assessment Cervical / Trunk Assessment: Kyphotic   Communication Communication Communication: HOH(hears better in R ear)   Cognition Arousal/Alertness: Awake/alert Behavior During Therapy: Flat affect Overall Cognitive Status:  History of cognitive impairments - at baseline                                 General Comments: STM deficits and slow processing at baseline   General Comments  Daughter who is primary caregiver present throughout session, involved and encouraging pt.    Exercises     Shoulder Instructions      Home Living Family/patient expects to be discharged to:: Private residence Living Arrangements: Children Available Help at Discharge: Family;Personal care attendant;Other (Comment);Available 24 hours/day Type of Home: House Home Access: Ramped entrance     Home Layout: One level           Bathroom Accessibility: No   Home Equipment: Wheelchair - manual;Hospital bed;Bedside commode   Additional Comments: Currently has HHOT, HHPT, and an aide (3 days a week for a 1 hour visit).      Prior Functioning/Environment Level of Independence: Needs assistance  Gait / Transfers Assistance Needed: Mostly wheelchair transfer level of mobility ADL's / Homemaking Assistance Needed: Family and Aide helps with sponge bathing at bed level. Pt wears depends and family performs toielt hygiene after use. Setup provided for eating, cues (2/2 vision) and modified feeding equipment (partioned plates, color contrast).   Comments: ADLs now taking place at bed level. On a good day, pt sits EOB for UB ADLs.        OT Problem List: Decreased strength;Decreased activity tolerance;Impaired balance (sitting and/or standing);Decreased knowledge of use of DME or AE;Decreased knowledge of precautions;Pain;Decreased cognition;Impaired vision/perception      OT Treatment/Interventions: Self-care/ADL training;Therapeutic exercise;Energy conservation;DME and/or AE instruction;Therapeutic activities;Patient/family education;Balance training    OT Goals(Current goals can be found in the care plan section) Acute Rehab OT Goals Patient Stated Goal: daughter: keep pt at home as long as she can, increase HH  aide hours OT Goal Formulation: With patient/family Time For Goal Achievement: 10/31/17 Potential to Achieve Goals: Good ADL Goals Pt Will Perform Grooming: with max assist;sitting Pt Will Perform Upper Body Bathing: with max assist;sitting Pt Will Perform Upper Body Dressing: with max assist;sitting Pt/caregiver will Perform Home Exercise Program: Increased strength;Both right and left upper extremity;With Supervision Additional ADL Goal #1: Pt will complete EOB<>w/c transfer with max A +1.  OT Frequency: Min 2X/week   Barriers to D/C:            Co-evaluation              AM-PAC PT "6 Clicks" Daily Activity     Outcome Measure Help from another person eating meals?: A Little Help from another person taking care of personal grooming?: Total Help from another person toileting, which includes using toliet, bedpan, or urinal?: Total Help from another person bathing (including washing, rinsing, drying)?: Total Help from another person to put on and taking off regular upper body clothing?: Total Help from another person to put on and taking off regular lower body clothing?: Total 6 Click Score: 8   End of Session    Activity Tolerance: Patient limited by fatigue;Patient tolerated treatment well Patient left: in  bed;with call bell/phone within reach;with family/visitor present  OT Visit Diagnosis: Muscle weakness (generalized) (M62.81);Unsteadiness on feet (R26.81);Pain;Low vision, both eyes (H54.2)                Time: 7510-2585 OT Time Calculation (min): 29 min Charges:  OT General Charges $OT Visit: 1 Visit OT Evaluation $OT Eval Low Complexity: 1 Low OT Treatments $Self Care/Home Management : 8-22 mins  Tyrone Schimke, OT Acute Rehabilitation Services Pager: [phone] Office: [phone]   Hortencia Pilar 10/17/2017, 10:03 AM

## 2017-10-17 NOTE — Progress Notes (Signed)
Paitent's IV was bleeding-   Paged MD  MD called back- made aware of IV due to high INR Per MD will monitor for now- may be Warfarin has not gotten out of system.    Also, pt request ambien and tramadol  MD will check her list and order

## 2017-10-17 NOTE — Evaluation (Signed)
Physical Therapy Evaluation Patient Details Name: [name] MRN: [id number] DOB: Feb 26, 1920 Today's Date: 10/17/2017   History of Present Illness  84 y.o. female presenting with supratherapeutic INR and hypokalemia . PMH is significant for PAF on warfarin, Chronic combined CHF, Complete Heart block with pacemaker, CAD s/p CABG 1994, T2DM, CKD III, hypothyroidism, HLD.    Clinical Impression  Pt admitted with above diagnosis. Pt currently with functional limitations due to the deficits listed below (see PT Problem List). History obtained by daughter who is present and primary caregiver. Pt mostly with w/c mobility for OOB at baseline, focused on therex and bed mobility and deferred functional transfers this visit due to high INR. daughter states she wants to take care of her at home as long as possible before entertaining idea of SNF. Discussed once INR lower we can focus on transfers and ensuring she is safe with her at home/level of care.  Pt will benefit from skilled PT to increase their independence and safety with mobility to allow discharge to the venue listed below.       Follow Up Recommendations Home health PT;Supervision/Assistance - 24 hour(INCREASE HH SERVICES )    Equipment Recommendations  None recommended by PT    Recommendations for Other Services       Precautions / Restrictions Precautions Precautions: Fall Restrictions Weight Bearing Restrictions: No      Mobility  Bed Mobility Overal bed mobility: Needs Assistance Bed Mobility: Supine to Sit;Sit to Supine     Supine to sit: Max assist;HOB elevated Sit to supine: Mod assist   General bed mobility comments: Max A to bring legs over EOB, max A to power trunk. once seated can tolerate sitting with min guard  Transfers                 General transfer comment: defered due to high INR and unstediness.   Ambulation/Gait                Stairs            Wheelchair Mobility     Modified Rankin (Stroke Patients Only)       Balance Overall balance assessment: Needs assistance Sitting-balance support: Bilateral upper extremity supported;Feet supported Sitting balance-Leahy Scale: Fair Sitting balance - Comments: able to sit EOB min guard for safety for 3-4 minutes. Fatigued quickly                                     Pertinent Vitals/Pain Pain Assessment: Faces Faces Pain Scale: Hurts a little bit Pain Location: unspecified Pain Descriptors / Indicators: Aching Pain Intervention(s): Monitored during session    Home Living Family/patient expects to be discharged to:: Private residence Living Arrangements: Children Available Help at Discharge: Family;Personal care attendant;Other (Comment);Available 24 hours/day Type of Home: House Home Access: Ramped entrance     Home Layout: One level Home Equipment: Wheelchair - manual;Hospital bed;Bedside commode Additional Comments: Currently has HHOT, HHPT, and an aide (3 days a week for a 1 hour visit).    Prior Function Level of Independence: Needs assistance   Gait / Transfers Assistance Needed: Mostly wheelchair transfer level of mobility  ADL's / Homemaking Assistance Needed: Family and Aide helps with sponge bathing at bed level. Pt wears depends and family performs toielt hygiene after use. Setup provided for eating, cues (2/2 vision) and modified feeding equipment (partioned plates, color contrast).  Comments: ADLs now  taking place at bed level. On a good day, pt sits EOB for UB ADLs.     Hand Dominance   Dominant Hand: Right    Extremity/Trunk Assessment   Upper Extremity Assessment Upper Extremity Assessment: Generalized weakness    Lower Extremity Assessment Lower Extremity Assessment: Generalized weakness    Cervical / Trunk Assessment Cervical / Trunk Assessment: Kyphotic  Communication   Communication: HOH  Cognition Arousal/Alertness: Awake/alert Behavior During  Therapy: Flat affect Overall Cognitive Status: History of cognitive impairments - at baseline                                 General Comments: STM deficits and slow processing at baseline      General Comments General comments (skin integrity, edema, etc.): daugther present    Exercises     Assessment/Plan    PT Assessment Patient needs continued PT services  PT Problem List Decreased strength;Decreased range of motion;Decreased activity tolerance;Decreased mobility;Decreased balance       PT Treatment Interventions DME instruction;Gait training;Stair training;Functional mobility training;Therapeutic activities;Therapeutic exercise;Balance training    PT Goals (Current goals can be found in the Care Plan section)  Acute Rehab PT Goals Patient Stated Goal: daughter: keep pt at home as long as she can, increase HH aide hours PT Goal Formulation: With patient Time For Goal Achievement: 10/31/17 Potential to Achieve Goals: Fair    Frequency Min 3X/week   Barriers to discharge        Co-evaluation               AM-PAC PT "6 Clicks" Daily Activity  Outcome Measure Difficulty turning over in bed (including adjusting bedclothes, sheets and blankets)?: Unable Difficulty moving from lying on back to sitting on the side of the bed? : Unable Difficulty sitting down on and standing up from a chair with arms (e.g., wheelchair, bedside commode, etc,.)?: Unable Help needed moving to and from a bed to chair (including a wheelchair)?: Total Help needed walking in hospital room?: Total Help needed climbing 3-5 steps with a railing? : Total 6 Click Score: 6    End of Session   Activity Tolerance: Patient limited by fatigue Patient left: in bed;with call bell/phone within reach Nurse Communication: Mobility status PT Visit Diagnosis: Unsteadiness on feet (R26.81);Other abnormalities of gait and mobility (R26.89);Muscle weakness (generalized) (M62.81)    Time:  1660-6004 PT Time Calculation (min) (ACUTE ONLY): 28 min   Charges:   PT Evaluation $PT Eval Moderate Complexity: 1 Mod PT Treatments $Gait Training: 8-22 mins      Reinaldo Berber, PT, DPT Acute Rehabilitation Services Pager: [phone] Office: [phone]    Reinaldo Berber 10/17/2017, 12:00 PM

## 2017-10-17 NOTE — Progress Notes (Signed)
Completed admission database with pt daughter at bedside  

## 2017-10-18 DIAGNOSIS — I2510 Atherosclerotic heart disease of native coronary artery without angina pectoris: Secondary | ICD-10-CM | POA: Diagnosis present

## 2017-10-18 DIAGNOSIS — N183 Chronic kidney disease, stage 3 (moderate): Secondary | ICD-10-CM | POA: Diagnosis present

## 2017-10-18 DIAGNOSIS — Z961 Presence of intraocular lens: Secondary | ICD-10-CM | POA: Diagnosis present

## 2017-10-18 DIAGNOSIS — I130 Hypertensive heart and chronic kidney disease with heart failure and stage 1 through stage 4 chronic kidney disease, or unspecified chronic kidney disease: Secondary | ICD-10-CM | POA: Diagnosis present

## 2017-10-18 DIAGNOSIS — I5042 Chronic combined systolic (congestive) and diastolic (congestive) heart failure: Secondary | ICD-10-CM | POA: Diagnosis present

## 2017-10-18 DIAGNOSIS — K449 Diaphragmatic hernia without obstruction or gangrene: Secondary | ICD-10-CM | POA: Diagnosis present

## 2017-10-18 DIAGNOSIS — I252 Old myocardial infarction: Secondary | ICD-10-CM | POA: Diagnosis not present

## 2017-10-18 DIAGNOSIS — I480 Paroxysmal atrial fibrillation: Secondary | ICD-10-CM | POA: Diagnosis present

## 2017-10-18 DIAGNOSIS — E7800 Pure hypercholesterolemia, unspecified: Secondary | ICD-10-CM | POA: Diagnosis present

## 2017-10-18 DIAGNOSIS — Z7984 Long term (current) use of oral hypoglycemic drugs: Secondary | ICD-10-CM | POA: Diagnosis not present

## 2017-10-18 DIAGNOSIS — Z9842 Cataract extraction status, left eye: Secondary | ICD-10-CM | POA: Diagnosis not present

## 2017-10-18 DIAGNOSIS — E1122 Type 2 diabetes mellitus with diabetic chronic kidney disease: Secondary | ICD-10-CM | POA: Diagnosis present

## 2017-10-18 DIAGNOSIS — Z86711 Personal history of pulmonary embolism: Secondary | ICD-10-CM | POA: Diagnosis not present

## 2017-10-18 DIAGNOSIS — I441 Atrioventricular block, second degree: Secondary | ICD-10-CM | POA: Diagnosis present

## 2017-10-18 DIAGNOSIS — Z9841 Cataract extraction status, right eye: Secondary | ICD-10-CM | POA: Diagnosis not present

## 2017-10-18 DIAGNOSIS — R791 Abnormal coagulation profile: Secondary | ICD-10-CM | POA: Diagnosis not present

## 2017-10-18 DIAGNOSIS — I442 Atrioventricular block, complete: Secondary | ICD-10-CM | POA: Diagnosis present

## 2017-10-18 DIAGNOSIS — Z86718 Personal history of other venous thrombosis and embolism: Secondary | ICD-10-CM | POA: Diagnosis not present

## 2017-10-18 DIAGNOSIS — K219 Gastro-esophageal reflux disease without esophagitis: Secondary | ICD-10-CM | POA: Diagnosis present

## 2017-10-18 DIAGNOSIS — E876 Hypokalemia: Secondary | ICD-10-CM | POA: Diagnosis present

## 2017-10-18 DIAGNOSIS — Z7901 Long term (current) use of anticoagulants: Secondary | ICD-10-CM | POA: Diagnosis not present

## 2017-10-18 DIAGNOSIS — Z951 Presence of aortocoronary bypass graft: Secondary | ICD-10-CM | POA: Diagnosis not present

## 2017-10-18 DIAGNOSIS — R531 Weakness: Secondary | ICD-10-CM | POA: Diagnosis not present

## 2017-10-18 DIAGNOSIS — I482 Chronic atrial fibrillation, unspecified: Secondary | ICD-10-CM | POA: Diagnosis present

## 2017-10-18 DIAGNOSIS — D631 Anemia in chronic kidney disease: Secondary | ICD-10-CM | POA: Diagnosis present

## 2017-10-18 DIAGNOSIS — E038 Other specified hypothyroidism: Secondary | ICD-10-CM | POA: Diagnosis present

## 2017-10-18 LAB — CBC
HEMATOCRIT: 33 % — AB (ref 36.0–46.0)
HEMOGLOBIN: 10 g/dL — AB (ref 12.0–15.0)
MCH: 27.5 pg (ref 26.0–34.0)
MCHC: 30.3 g/dL (ref 30.0–36.0)
MCV: 90.7 fL (ref 78.0–100.0)
Platelets: 158 10*3/uL (ref 150–400)
RBC: 3.64 MIL/uL — AB (ref 3.87–5.11)
RDW: 15.8 % — ABNORMAL HIGH (ref 11.5–15.5)
WBC: 7.5 10*3/uL (ref 4.0–10.5)

## 2017-10-18 LAB — BASIC METABOLIC PANEL
Anion gap: 4 — ABNORMAL LOW (ref 5–15)
BUN: 8 mg/dL (ref 8–23)
CO2: 29 mmol/L (ref 22–32)
Calcium: 8.2 mg/dL — ABNORMAL LOW (ref 8.9–10.3)
Chloride: 108 mmol/L (ref 98–111)
Creatinine, Ser: 1.37 mg/dL — ABNORMAL HIGH (ref 0.44–1.00)
GFR calc non Af Amer: 32 mL/min — ABNORMAL LOW (ref >60)
GFR, EST AFRICAN AMERICAN: 37 mL/min — AB (ref >60)
Glucose, Bld: 157 mg/dL — ABNORMAL HIGH (ref 70–99)
POTASSIUM: 3.7 mmol/L (ref 3.5–5.1)
Sodium: 141 mmol/L (ref 135–145)

## 2017-10-18 LAB — PROTIME-INR
INR: 7.42 — AB
Prothrombin Time: 62.7 seconds — ABNORMAL HIGH (ref 11.4–15.2)

## 2017-10-18 MED ORDER — PHYTONADIONE 1 MG/0.5 ML ORAL SOLUTION
2.0000 mg | Freq: Once | ORAL | Status: AC
  Administered 2017-10-18: 2 mg via ORAL
  Filled 2017-10-18 (×3): qty 1

## 2017-10-18 MED ORDER — FUROSEMIDE 20 MG PO TABS
20.0000 mg | ORAL_TABLET | Freq: Once | ORAL | Status: AC
  Administered 2017-10-18: 20 mg via ORAL
  Filled 2017-10-18: qty 1

## 2017-10-18 MED ORDER — POTASSIUM CHLORIDE CRYS ER 10 MEQ PO TBCR
10.0000 meq | EXTENDED_RELEASE_TABLET | Freq: Once | ORAL | Status: AC
  Administered 2017-10-18: 10 meq via ORAL
  Filled 2017-10-18: qty 1

## 2017-10-18 NOTE — Discharge Summary (Signed)
[facility] Discharge Summary  Patient name: [name] Medical record number: [id number] Date of birth: Jan 12, 1921 Age: 84 y.o. Gender: female Date of Admission: 10/16/2017  Date of Discharge: 10/19/2017 Admitting Physician: Guadalupe Dawn, MD  Primary Care Provider: [name], MD Consultants: None  Indication for Hospitalization: Supratherapeutc INR and Hypokalemia  Discharge Diagnoses/Problem List:  Supratherapeutic INR Hypokalemia Weakness Asymptomatic Bacturia PAF CKD III T2DM Complete Heart Block with Pacer CAD s/p CABG Chronic Combined CHF Hypothryoidism HLD GERD Urinary Retention Constipation  Disposition: Home with home health  Discharge Condition: Stable  Discharge Exam:  General: 84 y.o. female in NAD Cardio: RRR no m/r/g Lungs: CTAB, no wheezing, no rhonchi, no crackles Abdomen: Soft, non-tender to palpation, positive bowel sounds Skin: warm and dry Extremities: Trace BLE   Brief Hospital Course:  [name] a 84 y.o.femalepresenting with supratherapeutic INR and hypokalemia. PMH is significant for PAF on warfarin, Chronic combined CHF, Complete Heart block with pacemaker, CAD s/p CABG 1994, T2DM, CKD III, hypothyroidism, HLD.  Her hospital course is outlined below.  INR Admitted with INR 6.83 without signs of bleeding.  INR continued to increased over the next two days.  Vitamin K was administered and INR improved to 3.11 on the day of discharge.  At the time of discharge, her vitals were stable and she was not showing signs of bleeding.  She was discharged with instructions to continue to hold coumadin, to have INR checked tomorrow, and to not reume coumadin until instructed by her cardiologist.  Dr. Sallyanne Kuster was spoken to by phone during this hospitalization and recommended continued warfarin therapy long term.  It is recommended that goals of care discussions continue to occur as an outpatient with the  patient, her family, and her providers.  Possibly consider a DOAC for anticoagulation.  Hypokalemia Likely 2/2 increased lasix dose without increased K supplementation.  Was repleated while inpatient and corrected appropriately.  K was 3.9 on discharge.  She was discharged with instructions to take Potassium chloride 47mq for every 263mof Lasix she takes.     Diarrhea On amitiza chronically per GI.  She began having diarrhea while inpatient, which daughter notes happens frequently.  She stated that in order for the patient to go to church without soiling herself on Sundays, she has to hold the [place].  Amitiza was held with improvement of diarrhea.  As outpatient, patient may benefit from a decreased dose.  Issues for Follow Up:  1. Consider decreased amitiza dose as was having diarrhea. 2. Consider continuing goals of care discussions regarding anticoagulation.  Possibly consider a DOAC in place of warfarin. 3. Continued goals of care discussions  Significant Procedures: None  Significant Labs and Imaging:  Recent Labs  Lab 10/17/17 0431 10/18/17 1033 10/19/17 0536  WBC 5.3 7.5 5.8  HGB 9.7* 10.0* 9.3*  HCT 31.9* 33.0* 30.3*  PLT 137* 158 147*   Recent Labs  Lab 10/16/17 1258 10/17/17 0431 10/18/17 1033 10/19/17 0536  NA 142 141 141 140  K 2.9* 3.5 3.7 3.9  CL 103 105 108 106  CO2 _0 GLUCOSE 136* 150* 157* 107*  BUN 7* 7* 8 10  CREATININE 1.42* 1.27* 1.37* 1.59*  CALCIUM 8.5* 8.2* 8.2* 8.3*  ALKPHOS 121 104  --   --   AST 21 20  --   --   ALT 9 8  --   --   ALBUMIN 2.2* 2.0*  --   --  INR 6.83>7.32>7.42>3.11  Dg Chest Portable 1 View  Result Date: 10/16/2017 CLINICAL DATA:  Increase weakness.  Elevated INR.  Hypokalemia. EXAM: PORTABLE CHEST 1 VIEW COMPARISON:  09/10/2017. FINDINGS: Prior CABG. Cardiac pacer with lead tip over the right atrium and right ventricle. Cardiomegaly with normal pulmonary vascularity. No focal infiltrate.  Degenerative changes scoliosis thoracic spine. IMPRESSION: 1. Cardiac pacer with lead tip over the right atrium right ventricle. Prior CABG. Cardiomegaly with normal pulmonary vascularity. Interval resolution of changes of CHF. 2.  No acute pulmonary disease. Electronically Signed   By: [name]  Register   On: 10/16/2017 13:32   Results/Tests Pending at Time of Discharge: None  Discharge Medications:  Allergies as of 10/19/2017      Reactions   Macrobid [nitrofurantoin] Rash, Other (See Comments), Cough   Wheezing (also) THIS REACTION RESULTED IN THE PATIENT ENDING UP IN THE E.D.   Darvon Other (See Comments)   Causes confusion   Digoxin And Related Nausea Only   Penicillins Other (See Comments)   From childhood: Has patient had a PCN reaction causing immediate rash, facial/tongue/throat swelling, SOB or lightheadedness with hypotension: Unknown Has patient had a PCN reaction causing severe rash involving mucus membranes or skin necrosis: Unknown Has patient had a PCN reaction that required hospitalization: Unknown Has patient had a PCN reaction occurring within the last 10 years: No If all of the above answers are "NO", then may proceed with Cephalosporin use.   Percocet [oxycodone-acetaminophen] Other (See Comments)   Causes confusion   Percodan [oxycodone-aspirin] Other (See Comments)   Causes confusion   Vicodin [hydrocodone-acetaminophen] Other (See Comments)   Causes confusion      Medication List    STOP taking these medications   warfarin 3 MG tablet Commonly known as:  COUMADIN     TAKE these medications   ACCU-CHEK AVIVA PLUS w/Device Kit Use to check sugars 2 times daily.   ACCU-CHEK FASTCLIX LANCETS Misc Use to check sugar 2 times daily   acetaminophen 500 MG tablet Commonly known as:  TYLENOL Take 500 mg by mouth every 6 (six) hours as needed (pain).   AMITIZA 24 MCG capsule Generic drug:  lubiprostone Take 1 capsule (24 mcg total) by mouth 2 (two) times  daily with a meal.   atropine 1 % ophthalmic solution Place 1 drop into both eyes daily as needed (for burning).   donepezil 5 MG tablet Commonly known as:  ARICEPT Take 5 mg by mouth at bedtime.   folic acid 1 MG tablet Commonly known as:  FOLVITE TAKE 1 TABLET BY MOUTH EVERY DAY   furosemide 20 MG tablet Commonly known as:  LASIX Take 2 tablets (40 mg total) by mouth 2 (two) times a week. What changed:    when to take this  additional instructions   gabapentin 300 MG capsule Commonly known as:  NEURONTIN Take 300 mg by mouth 2 (two) times daily.   glipiZIDE 5 MG tablet Commonly known as:  GLUCOTROL Take 1/2 tablet when blood sugar goes over 200 What changed:    how much to take  how to take this  when to take this  reasons to take this  additional instructions   ketoconazole 2 % cream Commonly known as:  NIZORAL Apply 1 application topically as needed (for irritation or breakouts under the breasts).   levalbuterol 0.63 MG/3ML nebulizer solution Commonly known as:  XOPENEX Take 3 mLs by nebulization 3 (three) times daily as needed for wheezing or shortness of breath.  loratadine 10 MG tablet Commonly known as:  CLARITIN Take 10 mg by mouth daily.   magnesium citrate Soln Take 1 Bottle by mouth once as needed for mild constipation or moderate constipation.   meclizine 25 MG tablet Commonly known as:  ANTIVERT Take 25 mg by mouth See admin instructions. Take 25 mg by mouth two times a day, every other day, as needed for vertigo   montelukast 10 MG tablet Commonly known as:  SINGULAIR Take 10 mg by mouth daily.   MUCUS RELIEF CHEST CONGESTION 400 MG Tabs tablet Generic drug:  guaifenesin Take 400 mg by mouth 2 (two) times daily as needed (cough/ congestion).   NUTRITIONAL SUPPLEMENTS PO Enterex Diabetic Nutritional Beverage: Drink 1 bottle by mouth once a day for supplementation when appetite lessens   ofloxacin 0.3 % ophthalmic solution Commonly  known as:  OCUFLOX Place 1 drop into both eyes 4 (four) times daily as needed (for drainage).   omeprazole 40 MG capsule Commonly known as:  PRILOSEC TAKE 1 CAPSULE BY MOUTH TWICE A DAY   ONETOUCH VERIO test strip Generic drug:  glucose blood USE TO TEST BLOOD SUGAR TWICE A DAY What changed:  See the new instructions.   OVER THE COUNTER MEDICATION Kidney supplement capsules (Choline 100 mg, trimethylglycine 50 mg, glutathione 50 mg, methyltetrahydrofolate, l-arginine-, l-ornithine, l-citruline): Take 1 capsule by mouth two to three times a week   Potassium Chloride CR 8 MEQ Cpcr capsule CR Commonly known as:  MICRO-K Take 1 capsule on Mondays and Thursdays, the days you take your Lasix. What changed:    how much to take  how to take this  when to take this  additional instructions   PROAIR HFA 108 (90 Base) MCG/ACT inhaler Generic drug:  albuterol Inhale 2 puffs into the lungs every 6 (six) hours as needed for wheezing or shortness of breath.   REFRESH OP Place 1 drop into both eyes daily as needed (dry eyes).   rosuvastatin 20 MG tablet Commonly known as:  CRESTOR Take 1 tablet (20 mg total) by mouth daily. What changed:  when to take this   SYNTHROID 125 MCG tablet Generic drug:  levothyroxine TAKE 1/2 TABLET BY MOUTH EVERY DAY What changed:    how much to take  when to take this   tamsulosin 0.4 MG Caps capsule Commonly known as:  FLOMAX Take 1 capsule (0.4 mg total) by mouth daily after supper.   traMADol 50 MG tablet Commonly known as:  ULTRAM Take 1 tablet (50 mg total) by mouth 2 (two) times daily as needed for moderate pain.   triamcinolone cream 0.1 % Commonly known as:  KENALOG Apply 1 application topically 2 (two) times daily as needed (for skin "flares").   zolpidem 10 MG tablet Commonly known as:  AMBIEN Take 5-10 mg by mouth at bedtime as needed for sleep.       Discharge Instructions: Please refer to Patient Instructions section of  EMR for full details.  Patient was counseled important signs and symptoms that should prompt return to medical care, changes in medications, dietary instructions, activity restrictions, and follow up appointments.   Follow-Up Appointments: Follow-up Information    Care, [facility] Follow up.   Specialty:  Home Health Services Why:  Home Health RN, Physical Therapy, Occupational Therapy, aide -agency will call to arrange initial visit Contact information: [address] [place] [address] [phone]        [name], MD. Call.  Specialty:  Internal Medicine Why:  to make appointment in the next two weeks Contact information: [place]. [place] [address] [phone]        Sanda Klein, MD.   Specialty:  Cardiology Why:  call to make a follow up appointment Contact information: [address] [place] [address] [phone]        Call [place].   Why:  ASAP for INR check tomorrow 10/5 Contact information: [place] [address] [place] Mel, [place], DO 10/19/2017, 9:44 PM PGY-1, [place]

## 2017-10-18 NOTE — Progress Notes (Signed)
Family Medicine Teaching Service Daily Progress Note Intern Pager: [phone]  Patient name: [name] Medical record number: [id number] Date of birth: [birth year] Age: 84 y.o. Gender: female  Primary Care Provider: [name], MD Consultants: None Code Status: DNI  Pt Overview and Major Events to Date:  10/1 Admitted to FPTS  Assessment and Plan: [name] is a 84 y.o. female presenting with supratherapeutic INR and hypokalemia . PMH is significant for PAF on warfarin, Chronic combined CHF, Complete Heart block with pacemaker, CAD s/p CABG 1994, T2DM, CKD III, hypothyroidism, HLD.   Supratherapeutic INR INR 6.83>7.32>7.42>3.11. Given 2g Vitamin K yesterday. - will send home without warfarin, to go to INR clinic tomorrow - f/u with INR clinic - monitor for signs of bleeding  - No DVT PPx given high INR and at baseline activity - palliative consult - PT/OT - will cont warfarin outpatient per conversation with Dr. Sallyanne Kuster  Hypokalemia: Improved Given regular K supplementation yesterday with her home originaly Lasix dose of 20mg .  K this AM 3.9. - cont Lasix 20mg  Monday and Thursday with K repletion - when receives 40mg  Lasix, increase K supplementation from 8 to 16  Weakness: Chronic At baseline this AM. - PT/OT consulted - [place] face-to-face ordered PT, OT, RN, Aide - monitor vitals  Asymptomatic Bacturia Likely colonized with MDR bacteria.  Remains asymptomatic. - monitor for symptoms, vitals - would treat if symptoms occur - f/u Urine Cx  Paroxysmal Atrial Fibrillation CHADSVASc 9.  Not in A Fib at present. - holding warfarin per above - cont warfarin as outpatient per discussion with Dr. Sallyanne Kuster - encouraged family to continue to have goals of care discussion with physicians as an outpatient  CKD III: Stable Baseline 1.5-2 on chart review. Cr this AM 1.59. - f/u BMP  T2DM: Chronic, well-controlled Last A1c 6.5 04/2017.  On glipizide at  home. -cont home glipizide  Complete Heart Block with Pacer. - monitor vitals  CAD s/p CABG 1994: Stable   - cont statin - hold warfarin per above  Chronic Combined CHF Last EF 25-30%, G1DD in August 2019. Euvolemic on exam.  Received home Lasix yesterday.  Weight 81.2kg today, up from admission at 76.8. - cont Lasix every Monday and Thursday at 20mg  - consider increasing Monday dose to 40 and replete K per above - monitor for signs of fluid overload - cautiously hydrate prn given low EF  Hypothyroidism: Chronic  Last TSH 1.36 in 04/2017.  On synthroid 62.5 mcg QD. - cont home synthroid  HLD: Chronic  - cont home crestor  GERD: Chronic  On Omeprazole 40mg  BID.   - hold home omeprazole - protonix while inpatient  Urinary Retention: Chronic - cont flomax 0.4mg   Constipation On amitiza BID at home. D/c'ed yesterday.  Diarrhea resolved. Daughter notes that she usually only gives it to her Sunday-Thursday. - d/c amitiza for now, can continue as outpatient - consider decreasing dose as outpatient   FEN/GI: Heart Healthy Carb Modified PPx: None given at baseline activity and increased bleeding risk  Disposition: home with home health PT/OT pending improvement of INR  Subjective:  Daughter notes patient is at baseline without complaints.  Diarrhea has resolved.  Objective: Temp:  [97.6 F (36.4 C)-98.8 F (37.1 C)] 97.6 F (36.4 C) (10/04 0500) Pulse Rate:  [82-93] 82 (10/04 0500) Resp:  [15-19] 17 (10/04 0500) BP: (87-107)/(48-64) 107/60 (10/04 0500) SpO2:  [97 %-100 %] 99 % (10/04 0500) Weight:  [81.2 kg] 81.2 kg (10/04 0500)  Physical Exam:  General: 84 y.o. female in NAD Cardio: RRR no m/r/g Lungs: CTAB, no wheezing, no rhonchi, no crackles Abdomen: Soft, non-tender to palpation, positive bowel sounds Skin: warm and dry Extremities: Trace BLE    Laboratory: Recent Labs  Lab 10/16/17 1258 10/17/17 0431 10/18/17 1033  WBC 6.4 5.3 7.5  HGB  10.4* 9.7* 10.0*  HCT 34.8* 31.9* 33.0*  PLT 149* 137* 158   Recent Labs  Lab 10/16/17 1258 10/17/17 0431 10/18/17 1033  NA 142 141 141  K 2.9* 3.5 3.7  CL 103 105 108  CO2 29 29 29   BUN 7* 7* 8  CREATININE 1.42* 1.27* 1.37*  CALCIUM 8.5* 8.2* 8.2*  PROT 6.3* 5.5*  --   BILITOT 0.6 0.5  --   ALKPHOS 121 104  --   ALT 9 8  --   AST 21 20  --   GLUCOSE 136* 150* 157*    Imaging/Diagnostic Tests: No results found.  [place], DO 10/19/2017, 6:48 AM PGY-1, [place] Intern pager: [phone], text pages welcome

## 2017-10-18 NOTE — Progress Notes (Signed)
PT Cancellation Note  Patient Details Name: [name] MRN: [id number] DOB: [birth year]   Cancelled Treatment:    Reason Eval/Treat Not Completed: Medical issues which prohibited therapy(INR)   Sandy Salaam Manfred Laspina 10/18/2017, 7:13 AM  Elwyn Reach, PT Acute Rehabilitation Services Pager: [phone] Office: [phone]

## 2017-10-18 NOTE — Care Management Note (Addendum)
Case Management Note  Patient Details  Name: [name] MRN: [id number] Date of Birth: 14-Aug-1920  Subjective/Objective:  Supratherapeutic INR, hypokalemia               Action/Plan: NCM spoke to pt and dtr, [name] at bedside. Dtr lives in home with pt. Active with [facility] for HHPT, OT, RN and aide. Contacted [facility] to make aware will need [place] orders with F2F. Pt has RW, wheelchair and bedside commode at home. Dtr states pt does not qualify for Medicaid. She is wanting more assistance in home. Notified attending for resumption of care orders.   Expected Discharge Date:                  Expected Discharge Plan:  [place]  In-House Referral:  NA  Discharge planning Services  CM Consult  Post Acute Care Choice:  Home Health, Resumption of Svcs/PTA Provider Choice offered to:  Adult Children  DME Arranged:  N/A DME Agency:  NA  HH Arranged:  PT, RN, OT, Nurse's Aide [place] Agency:  [facility]  Status of Service:  Completed, signed off  If discussed at [place] of Stay Meetings, dates discussed:    Additional Comments:  Erenest Rasher, RN 10/18/2017, 11:43 AM

## 2017-10-18 NOTE — Progress Notes (Signed)
Occupational Therapy Treatment Patient Details Name: [name] MRN: [id number] DOB: 04/03/1920 Today's Date: 10/18/2017    History of present illness 84 y.o. female presenting with supratherapeutic INR and hypokalemia . PMH is significant for PAF on warfarin, Chronic combined CHF, Complete Heart block with pacemaker, CAD s/p CABG 1994, T2DM, CKD III, hypothyroidism, HLD.   OT comments  Pt continues to have high INR. Plan for assisting pt to sit EOB, but pt reporting she was too fatigued. Pt with soreness on heels and buttocks. Positioned on R side and floated heels on folded blankets with pt reporting relief.  OF NOTE: MD discontinued OT order after pt seen today.   Follow Up Recommendations  Home health OT;Supervision/Assistance - 24 hour    Equipment Recommendations  None recommended by OT    Recommendations for Other Services      Precautions / Restrictions Precautions Precautions: Fall Precaution Comments: pt with elevated INR Restrictions Weight Bearing Restrictions: No       Mobility Bed Mobility Overal bed mobility: Needs Assistance Bed Mobility: Rolling Rolling: Mod assist         General bed mobility comments: rolled to R side to position of buttocks  Transfers                 General transfer comment: deferred OOB due to elevated INR    Balance                                           ADL either performed or assessed with clinical judgement   ADL                                               Vision       Perception     Praxis      Cognition Arousal/Alertness: Awake/alert Behavior During Therapy: Flat affect Overall Cognitive Status: History of cognitive impairments - at baseline                                          Exercises     Shoulder Instructions       General Comments      Pertinent Vitals/ Pain       Pain Assessment: Faces Faces Pain Scale: Hurts  little more Pain Location: heels Pain Descriptors / Indicators: Sore Pain Intervention(s): Repositioned(floated on folded blankets)  Home Living                                          Prior Functioning/Environment              Frequency  Min 2X/week        Progress Toward Goals  OT Goals(current goals can now be found in the care plan section)  Progress towards OT goals: Not progressing toward goals - comment(pt with fatigue and elevated INR)  Acute Rehab OT Goals Patient Stated Goal: daughter: keep pt at home as long as she can, increase HH aide hours OT Goal Formulation: With patient/family Time For Goal Achievement: 10/31/17 Potential  to Achieve Goals: [place] Discharge plan remains appropriate    Co-evaluation                 AM-PAC PT "6 Clicks" Daily Activity     Outcome Measure   Help from another person eating meals?: A Little Help from another person taking care of personal grooming?: Total Help from another person toileting, which includes using toliet, bedpan, or urinal?: Total Help from another person bathing (including washing, rinsing, drying)?: Total Help from another person to put on and taking off regular upper body clothing?: Total Help from another person to put on and taking off regular lower body clothing?: Total 6 Click Score: 8    End of Session    OT Visit Diagnosis: Muscle weakness (generalized) (M62.81);Unsteadiness on feet (R26.81);Pain;Low vision, both eyes (H54.2)   Activity Tolerance Patient limited by fatigue   Patient Left in bed;with call bell/phone within reach;with family/visitor present   Nurse Communication (needs prevalon boots)        Time: 6045-4098 OT Time Calculation (min): 14 min  Charges: OT General Charges $OT Visit: 1 Visit OT Treatments $Therapeutic Activity: 8-22 mins  Nestor Lewandowsky, OTR/L Acute Rehabilitation Services Pager: [phone] Office:  [phone]   Malka So 10/18/2017, 1:09 PM

## 2017-10-19 LAB — URINE CULTURE

## 2017-10-19 LAB — CBC
HEMATOCRIT: 30.3 % — AB (ref 36.0–46.0)
HEMOGLOBIN: 9.3 g/dL — AB (ref 12.0–15.0)
MCH: 27.6 pg (ref 26.0–34.0)
MCHC: 30.7 g/dL (ref 30.0–36.0)
MCV: 89.9 fL (ref 78.0–100.0)
Platelets: 147 10*3/uL — ABNORMAL LOW (ref 150–400)
RBC: 3.37 MIL/uL — ABNORMAL LOW (ref 3.87–5.11)
RDW: 15.7 % — ABNORMAL HIGH (ref 11.5–15.5)
WBC: 5.8 10*3/uL (ref 4.0–10.5)

## 2017-10-19 LAB — BASIC METABOLIC PANEL
Anion gap: 6 (ref 5–15)
BUN: 10 mg/dL (ref 8–23)
CO2: 28 mmol/L (ref 22–32)
Calcium: 8.3 mg/dL — ABNORMAL LOW (ref 8.9–10.3)
Chloride: 106 mmol/L (ref 98–111)
Creatinine, Ser: 1.59 mg/dL — ABNORMAL HIGH (ref 0.44–1.00)
GFR calc non Af Amer: 26 mL/min — ABNORMAL LOW (ref >60)
GFR, EST AFRICAN AMERICAN: 31 mL/min — AB (ref >60)
Glucose, Bld: 107 mg/dL — ABNORMAL HIGH (ref 70–99)
Potassium: 3.9 mmol/L (ref 3.5–5.1)
Sodium: 140 mmol/L (ref 135–145)

## 2017-10-19 LAB — PROTIME-INR
INR: 3.11
Prothrombin Time: 31.8 seconds — ABNORMAL HIGH (ref 11.4–15.2)

## 2017-10-19 NOTE — Progress Notes (Signed)
Contacted [facility] to make aware INR check needed on 10/5. [facility] RN scheduled visit for 10/5. Faxed orders, H&P and F2F to [facility]. Will fax dc summary when available.  Jonnie Finner RN CCM Case Mgmt phone [phone]

## 2017-10-19 NOTE — Discharge Instructions (Signed)
Do not take warfarin until you are told to by your heart doctor.  Please go to the clinic tomorrow for your INR check.  If you notice signs of bleeding, please come back to the hospital.  If you give Lasix 20mg , give 75mEq of potassium. If you give Lasix 40mg , give 16 mEq of potassium.

## 2017-10-19 NOTE — Progress Notes (Signed)
Pt seen 10/18/2017 (eval) and 10/19/2017(tx). New order in chart due to previous OT order had been discontinued. Pt continues to be on OT caseload. Will continue to follow pt next week if pt still inpatient. Golden Circle, OTR/L Acute Rehab Services Pager [phone] Office [phone]

## 2017-10-19 NOTE — Progress Notes (Signed)
Physical Therapy Treatment Patient Details Name: NATASJA NIDAY MRN: [id number] DOB: 1921-01-05 Today's Date: 10/19/2017    History of Present Illness 84 y.o. female presenting with supratherapeutic INR and hypokalemia . PMH is significant for PAF on warfarin, Chronic combined CHF, Complete Heart block with pacemaker, CAD s/p CABG 1994, T2DM, CKD III, hypothyroidism, HLD.    PT Comments    Pt pleasant on arrival, with family remembering [place] from last time. Pt INR within normal limits at latest value. Daughter was educated on transfers during last admission, but reports has not carried over to home due to "barriers". Discussed ways to remove barriers such as different sheet material for easier sliding, as well as practicing with the HHPT for bed and car transfers. Pt with increased strength today from previous admission with ability for short ambulation 42ft mod +2.     Follow Up Recommendations  Home health PT;Supervision/Assistance - 24 hour     Equipment Recommendations       Recommendations for Other Services       Precautions / Restrictions Precautions Precautions: Fall Restrictions Weight Bearing Restrictions: No    Mobility  Bed Mobility Overal bed mobility: Needs Assistance Bed Mobility: Rolling;Sidelying to Sit Rolling: Mod assist Sidelying to sit: Mod assist       General bed mobility comments: Roll to Rt mod, to Lt min with cues to bend knees and assist to find rail with hands. mod to elevate trunk and scoot hips with pad to EOB.   Transfers Overall transfer level: Needs assistance Equipment used: 2 person hand held assist Transfers: Sit to/from Stand Sit to Stand: Min assist;+2 physical assistance         General transfer comment: min assist +2 to rise with cues to hold onto arms.   Ambulation/Gait Ambulation/Gait assistance: Mod assist;+2 physical assistance Gait Distance (Feet): 5 Feet Assistive device: 2 person hand held assist Gait  Pattern/deviations: Shuffle;Narrow base of support;Trunk flexed;Step-through pattern;Decreased stride length Gait velocity: decreased  Gait velocity interpretation: <1.31 ft/sec, indicative of household ambulator General Gait Details: mod +2 for ambulation with 2 hha. pt daughter reports walks with grandson ~72ft on "good days"    Stairs             Wheelchair Mobility    Modified Rankin (Stroke Patients Only)       Balance Overall balance assessment: Needs assistance Sitting-balance support: Bilateral upper extremity supported;Feet supported Sitting balance-Leahy Scale: Fair Sitting balance - Comments: assist to achieve sitting, then able to maintain sitting ~2 minutes today.    Standing balance support: Bilateral upper extremity supported Standing balance-Leahy Scale: Zero Standing balance comment: mod +2 to stand and maintain balance. pt unable to achieve hip extension in standing.                             Cognition Arousal/Alertness: Awake/alert Behavior During Therapy: WFL for tasks assessed/performed Overall Cognitive Status: History of cognitive impairments - at baseline                                 General Comments: increased time for processing, STM deficit       Exercises General Exercises - Lower Extremity Long Arc Quad: AROM;Seated;Both;10 reps Hip Flexion/Marching: AROM;10 reps;Both;Seated    General Comments General comments (skin integrity, edema, etc.): daughter present       Pertinent Vitals/Pain Pain Assessment: Faces Faces  Pain Scale: Hurts a little bit Pain Location: RLE  Pain Descriptors / Indicators: Sore Pain Intervention(s): Limited activity within patient's tolerance;Monitored during session;Repositioned    Home Living                      Prior Function            PT Goals (current goals can now be found in the care plan section) Progress towards PT goals: Progressing toward goals     Frequency    Min 3X/week      PT Plan Current plan remains appropriate    Co-evaluation              AM-PAC PT "6 Clicks" Daily Activity  Outcome Measure  Difficulty turning over in bed (including adjusting bedclothes, sheets and blankets)?: Unable Difficulty moving from lying on back to sitting on the side of the bed? : Unable Difficulty sitting down on and standing up from a chair with arms (e.g., wheelchair, bedside commode, etc,.)?: Unable Help needed moving to and from a bed to chair (including a wheelchair)?: Total Help needed walking in hospital room?: Total Help needed climbing 3-5 steps with a railing? : Total 6 Click Score: 6    End of Session Equipment Utilized During Treatment: Gait belt Activity Tolerance: Patient limited by fatigue Patient left: in chair;with chair alarm set;with call bell/phone within reach;with family/visitor present Nurse Communication: Mobility status PT Visit Diagnosis: Unsteadiness on feet (R26.81);Other abnormalities of gait and mobility (R26.89);Muscle weakness (generalized) (M62.81)     Time: 1410-3013 PT Time Calculation (min) (ACUTE ONLY): 28 min  Charges:  $Therapeutic Activity: 23-37 mins                     Samuella Bruin, [place]  Acute Rehab [phone]    Samuella Bruin 10/19/2017, 11:10 AM

## 2017-10-19 NOTE — Progress Notes (Signed)
Family at bedside, does not want bed alarm set at this time Will continue to monitor

## 2017-10-19 NOTE — Progress Notes (Signed)
PT Cancellation Note  Patient Details Name: [name] MRN: [id number] DOB: 19-Jul-1920   Cancelled Treatment:    Reason Eval/Treat Not Completed: Medical issues which prohibited therapy(supratherapeutic INR)   [name] 10/19/2017, 7:24 AM  Elwyn Reach, PT Acute Rehabilitation Services Pager: [phone] Office: [phone]

## 2017-10-20 DIAGNOSIS — I10 Essential (primary) hypertension: Secondary | ICD-10-CM | POA: Diagnosis not present

## 2017-10-20 DIAGNOSIS — J45901 Unspecified asthma with (acute) exacerbation: Secondary | ICD-10-CM | POA: Diagnosis not present

## 2017-10-20 DIAGNOSIS — E119 Type 2 diabetes mellitus without complications: Secondary | ICD-10-CM | POA: Diagnosis not present

## 2017-10-20 DIAGNOSIS — I5032 Chronic diastolic (congestive) heart failure: Secondary | ICD-10-CM | POA: Diagnosis not present

## 2017-10-20 LAB — POCT INR: INR: 2 (ref 2.0–3.0)

## 2017-10-22 ENCOUNTER — Telehealth: Payer: Self-pay | Admitting: Cardiovascular Disease

## 2017-10-22 ENCOUNTER — Other Ambulatory Visit: Payer: Self-pay

## 2017-10-22 ENCOUNTER — Ambulatory Visit (INDEPENDENT_AMBULATORY_CARE_PROVIDER_SITE_OTHER): Payer: Medicare HMO | Admitting: Pharmacist

## 2017-10-22 DIAGNOSIS — Z7901 Long term (current) use of anticoagulants: Secondary | ICD-10-CM | POA: Diagnosis not present

## 2017-10-22 NOTE — Telephone Encounter (Signed)
Already talked to Lurline Hare (home health) - she will contact patient and daughter with instructions.

## 2017-10-22 NOTE — Telephone Encounter (Signed)
New Message:    Pt c/o medication issue:  1. Name of Medication: warfarin   2. How are you currently taking this medication (dosage and times per day)? ?  3. Are you having a reaction (difficulty breathing--STAT)? NO   4. What is your medication issue? Per Daughter:  Patient was discharged from the hospital and not sure how she should take her medication

## 2017-10-24 ENCOUNTER — Other Ambulatory Visit: Payer: Self-pay

## 2017-10-24 ENCOUNTER — Ambulatory Visit (INDEPENDENT_AMBULATORY_CARE_PROVIDER_SITE_OTHER): Payer: Medicare HMO | Admitting: Pharmacist Clinician (PhC)/ Clinical Pharmacy Specialist

## 2017-10-24 DIAGNOSIS — I482 Chronic atrial fibrillation, unspecified: Secondary | ICD-10-CM | POA: Diagnosis not present

## 2017-10-24 DIAGNOSIS — R69 Illness, unspecified: Secondary | ICD-10-CM | POA: Diagnosis not present

## 2017-10-24 DIAGNOSIS — I10 Essential (primary) hypertension: Secondary | ICD-10-CM | POA: Diagnosis not present

## 2017-10-24 DIAGNOSIS — Z7901 Long term (current) use of anticoagulants: Secondary | ICD-10-CM | POA: Diagnosis not present

## 2017-10-24 DIAGNOSIS — J45901 Unspecified asthma with (acute) exacerbation: Secondary | ICD-10-CM | POA: Diagnosis not present

## 2017-10-24 DIAGNOSIS — I5032 Chronic diastolic (congestive) heart failure: Secondary | ICD-10-CM | POA: Diagnosis not present

## 2017-10-24 DIAGNOSIS — E119 Type 2 diabetes mellitus without complications: Secondary | ICD-10-CM | POA: Diagnosis not present

## 2017-10-24 LAB — POCT INR: INR: 1.2 — AB (ref 2.0–3.0)

## 2017-10-24 NOTE — Patient Outreach (Signed)
[facility]) Care Management  10/24/2017  [name] [place] 1920/03/29 [id number]   EMMI- General Discharge RED ON EMMI ALERT Day # 1 Date: 10/23/17 Red Alert Reason:  Transportation to follow up appointment? No  Outreach attempt: spoke with patient.  She reports that she is doing ok.  Discussed red alert.  Patient reports that it was a mistake and that she has transportation to her appointments.  Patient declines any further needs at this time.     Plan: RN CM will close case.    Jone Baseman, RN, MSN [facility] Care Management Care Management Coordinator Direct Line [phone] Toll Free: [phone]  Fax: [phone]

## 2017-10-25 DIAGNOSIS — I5032 Chronic diastolic (congestive) heart failure: Secondary | ICD-10-CM | POA: Diagnosis not present

## 2017-10-25 DIAGNOSIS — I10 Essential (primary) hypertension: Secondary | ICD-10-CM | POA: Diagnosis not present

## 2017-10-25 DIAGNOSIS — J45901 Unspecified asthma with (acute) exacerbation: Secondary | ICD-10-CM | POA: Diagnosis not present

## 2017-10-25 DIAGNOSIS — E119 Type 2 diabetes mellitus without complications: Secondary | ICD-10-CM | POA: Diagnosis not present

## 2017-10-26 ENCOUNTER — Other Ambulatory Visit: Payer: Self-pay

## 2017-10-26 DIAGNOSIS — E119 Type 2 diabetes mellitus without complications: Secondary | ICD-10-CM | POA: Diagnosis not present

## 2017-10-26 DIAGNOSIS — I10 Essential (primary) hypertension: Secondary | ICD-10-CM | POA: Diagnosis not present

## 2017-10-26 DIAGNOSIS — J45901 Unspecified asthma with (acute) exacerbation: Secondary | ICD-10-CM | POA: Diagnosis not present

## 2017-10-26 DIAGNOSIS — I5032 Chronic diastolic (congestive) heart failure: Secondary | ICD-10-CM | POA: Diagnosis not present

## 2017-10-26 DIAGNOSIS — N390 Urinary tract infection, site not specified: Secondary | ICD-10-CM | POA: Diagnosis not present

## 2017-10-26 DIAGNOSIS — R6889 Other general symptoms and signs: Secondary | ICD-10-CM | POA: Diagnosis not present

## 2017-10-26 NOTE — Patient Outreach (Signed)
[facility]) Care Management  10/26/2017  [name] [place] [birth year] [id number]   EMMI- Heart Failure RED ON EMMI ALERT Day # 3 Date: 10/25/17 Red Alert Reason:   Weighed themselves today? No  Weight (lbs)    Any new problems? Yes  New/worsening problems? Yes  Nausea or vomiting? Yes  Tired/fatigued? Yes  Fever or chills? Yes   Outreach attempt: Spoke with daughter [name].  She is able to verify HIPAA.  Discussed red alerts. She states that patient scale was not working but it is working now.  She states that patient has some problems with questionable UTI.  She states that the nurse is coming out today to get a urine sample.  She states that the patient has the home health nurse coming 2/3 per week and that the nurse also does her INR's.  Discussed with daughter signs of Heart failure and UTI.  She verbalized understanding.  Advised daughter that CM will follow up on Monday.  She states that patient has an appointment on Monday with cardiologist Dr. Loletha Grayer.  She also states that patient has a follow up later with PCP Dr. Kevan Ny.       Plan: RN CM will follow up on 10-29-17.  Jone Baseman, RN, MSN [facility] Care Management Care Management Coordinator Direct Line [phone] Toll Free: [phone]  Fax: [phone]

## 2017-10-29 ENCOUNTER — Ambulatory Visit: Payer: Medicare HMO | Admitting: Cardiovascular Disease

## 2017-10-29 ENCOUNTER — Other Ambulatory Visit: Payer: Self-pay

## 2017-10-29 ENCOUNTER — Encounter: Payer: Self-pay | Admitting: Cardiovascular Disease

## 2017-10-29 VITALS — BP 75/48 | HR 82 | Ht 64.0 in | Wt 164.0 lb

## 2017-10-29 DIAGNOSIS — I5042 Chronic combined systolic (congestive) and diastolic (congestive) heart failure: Secondary | ICD-10-CM | POA: Diagnosis not present

## 2017-10-29 DIAGNOSIS — I480 Paroxysmal atrial fibrillation: Secondary | ICD-10-CM

## 2017-10-29 DIAGNOSIS — Z950 Presence of cardiac pacemaker: Secondary | ICD-10-CM | POA: Diagnosis not present

## 2017-10-29 DIAGNOSIS — I4729 Other ventricular tachycardia: Secondary | ICD-10-CM

## 2017-10-29 DIAGNOSIS — Z79899 Other long term (current) drug therapy: Secondary | ICD-10-CM

## 2017-10-29 DIAGNOSIS — J45901 Unspecified asthma with (acute) exacerbation: Secondary | ICD-10-CM | POA: Diagnosis not present

## 2017-10-29 DIAGNOSIS — I10 Essential (primary) hypertension: Secondary | ICD-10-CM | POA: Diagnosis not present

## 2017-10-29 DIAGNOSIS — I5032 Chronic diastolic (congestive) heart failure: Secondary | ICD-10-CM | POA: Diagnosis not present

## 2017-10-29 DIAGNOSIS — I25810 Atherosclerosis of coronary artery bypass graft(s) without angina pectoris: Secondary | ICD-10-CM

## 2017-10-29 DIAGNOSIS — E7800 Pure hypercholesterolemia, unspecified: Secondary | ICD-10-CM | POA: Diagnosis not present

## 2017-10-29 DIAGNOSIS — N390 Urinary tract infection, site not specified: Secondary | ICD-10-CM | POA: Diagnosis not present

## 2017-10-29 DIAGNOSIS — E118 Type 2 diabetes mellitus with unspecified complications: Secondary | ICD-10-CM | POA: Diagnosis not present

## 2017-10-29 DIAGNOSIS — I472 Ventricular tachycardia: Secondary | ICD-10-CM | POA: Diagnosis not present

## 2017-10-29 DIAGNOSIS — I442 Atrioventricular block, complete: Secondary | ICD-10-CM | POA: Diagnosis not present

## 2017-10-29 MED ORDER — APIXABAN 2.5 MG PO TABS: 2.5000 mg | ORAL_TABLET | Freq: Two times a day (BID) | ORAL | 5 refills | Status: DC

## 2017-10-29 NOTE — Patient Outreach (Signed)
[facility]) Care Management  10/29/2017  Zurri Rudden Raschke 09-14-1920 [id number]   EMMI- Heart Failure RED ON EMMI ALERT Day # 4 Date: 10/26/17 Red Alert Reason:  Weighed themselves? no  Outreach attempt:Spoke with daughter [name].  She is able to verify HIPAA.  She reports that patient went to the cardiologist today and patient was taken off of Lasix, flomax and potassium for a few days.  She states that she is to continue to monitor weights and call the doctor back on Wednesday.  She reports that patient weight today was 164 lbs and the physician felt like the patient was "too dry" so that is why they are holding the medications.  Discussed with her heart failure symptoms to look for.  She verbalized understanding.  She states that the Urinalysis was done on Friday and that they are waiting for the results.  Patient has home health through [facility].  She states that they have had the same home health nurse for the last few years and feel comfortable with her.  Patient will be seeing PCP Kevan Ny soon per daughter.    Discussed that she would continue to get automated calls and if there is some question about answer that it would trigger for a nurse to call. She verbalized understanding and thankful for the follow up.    Plan: RN CM will  Jone Baseman, RN, MSN [facility] Care Management Coordinator Direct Line [phone] Toll Free: [phone]  Fax: [phone]

## 2017-10-29 NOTE — Progress Notes (Signed)
Cardiology Office Note    Date:  10/30/2017   ID:  [name], DOB 01-03-1921, MRN [id number]  PCP:  [name], MD  Cardiologist:   Sanda Klein, MD   Chief complaint: pacemaker follow up   History of Present Illness:  [name] is a 84 y.o. female with a history of CAD s/p CABG 2863, combined systolic and diastolic heart failure, paroxysmal atrial fibrillation on warfarin chronic anticoagulation, hyperlipidemia, type 2 diabetes mellitus complicated by retinopathy and nephropathy, chronic kidney disease stage III, complete heart block s/p dual-chamber permanent pacemaker (Medtronic Adapta implanted 2017, generator change after initial implantation 2008).  She was hospitalized in October 1 with severe hypokalemia and supratherapeutic anticoagulation, but without active bleeding.  Her potassium was as low as 2.9 and her INR is high as 8.  At discharge from October 4 her potassium was 3.9, creatinine 1.6, INR 3.11, but by October 9 her INR was subtherapeutic at 1.2.  She has not had any overt neurological events.  In the past she has been resistant to switching to the direct oral anticoagulants due to cost constraints.  Echo performed from recent hospitalization shows further decrease in left ventricular systolic function with an ejection fraction down to 25-30%, without evidence of fluid overload.  There were extensive wall motion abnormalities seen in the distribution of the LAD artery as well as in the basal-mid inferolateral wall.  She has had problems with hypotension over the last week.  Last week on Wednesday she was in and out of sleep during the day and her blood pressure was around 80/50.  She is quite sleepy again today, but is easily awoken and fully oriented.  She has had nausea and poor oral intake has diminished.  She is now below a previously estimated "dry weight".  At home today she weighs 164 pounds (they have a platform scale that they can used to weigh  her in a wheelchair).  She is unable to stand.  Interrogation of her pacemaker shows normal device function.  Her dual-chamber Medtronic Adapta device was implanted in 2017 (the leads are from the original implanted 2008).  The current generator estimated longevity is 9 years.  She has 4.5% atrial pacing and 99.3% ventricular pacing.  The device has reported 4 episodes of nonsustained ventricular tachycardia over the last couple of months, the longest consisting of 18 beats.  The most recent 17 beat episode of nonsustained VT occurred on October 2 when she was hospitalized.  There is no correlation between arrhythmia and the episode of excessive somnolence that occurred last week.  She denies angina, dyspnea, full-blown syncope, palpitations and her edema is less than usual.  Pacemaker interrogation shows normal device function.  She has complete heart block and there are no detectable R waves.  She is pacemaker dependent.  There is 6.5% atrial pacing and 99% ventricular pacing.  The current generator is a dual-chamber Medtronic Adapta implanted in 2017 and has roughly 9.5 years of anticipated longevity.  She has not had any atrial fibrillation since her last device check.  She did have a brief nonsustained episode of paroxysmal atrial tachycardia at 200 bpm, lasting for 42 seconds.  No major cardiovascular events since her hospitalization with a small NSTEMI in July 2018, treated conservatively in view of advanced age and renal insufficiency.  She has not had any angina since then.  The echo in October 2019 shows further reduction in EF to 25-30%, without overt volume overload.  She  does have a history of GI bleeding, none recently.   Past Medical History:  Diagnosis Date  . 2nd degree atrioventricular block    a. 06/22/15 Gen change: MDT ADDR01 Adapta, DC PPM (ser# [id number]).  . Allergy to perfume   . Anemia   . Arthritis   . Chronic combined systolic and diastolic CHF (congestive heart failure)  ([place])    a. 01/2014 Echo: EF @ least mod-sev reduced with HK of lat/apical, basal inf walls. basalpost AK, Gr 1DD; b. 06/2015 Echo: EF 45-50%, Gr2 DD, mod LVH.   . CKD (chronic kidney disease), stage III ([place])    a. iii - iv.  . Coronary artery disease    a. 1994 s/p cabg;  b. 09/2013 MV: large, sev intensity, partially reversible inf, apical defect, prior inf/ap infarct w/ mild peri-infarct ischemia->Med Rx; c. 06/2015 NSTEMI (trop 6)->Med rx.  . Daily headache   . DVT of upper extremity (deep vein thrombosis) ([place]) 06/13/2012   BUE  . Dyspepsia   . Gastric ulcer   . GERD (gastroesophageal reflux disease)   . H/O: GI bleed 12//13  . Hiatal hernia   . High cholesterol   . History of blood transfusion 2013  . Hypertensive heart disease   . Hypothyroid   . Legally blind   . PAF (paroxysmal atrial fibrillation) ([facility])    a. CHA2DS2VASc = 7-->coumadin.  . Presence of permanent cardiac pacemaker    a. 06/22/15 Gen change: MDT ADDR01 Adapta, DC PPM (ser# [id number]).  . Seasonal allergies   . Type II diabetes mellitus ([place])   . UTI (lower urinary tract infection)     Past Surgical History:  Procedure Laterality Date  . CARDIAC CATHETERIZATION  10/25/92  . CARDIAC CATHETERIZATION  11/18/03   w/grafts 100%CX LAD 80 & 100%  . CARDIAC CATHETERIZATION  01/24/05   diffuse disease of native vessels  . CARDIAC CATHETERIZATION  06/06/06   severe native CAD  . CARDIOVERSION  06/06/06   successful  . CATARACT EXTRACTION W/ INTRAOCULAR LENS  IMPLANT, BILATERAL Bilateral   . CORONARY ARTERY BYPASS GRAFT  10/27/92   LIMA to LAD,SVG to LAD second diagonal,obtuse maraginal of the CX and posterior descendingbranch of the RCA  . EP IMPLANTABLE DEVICE N/A 06/22/2015   Procedure: PPM/BIV PPM Generator Changeout;  Surgeon: Sanda Klein, MD;  Location: [place] [facility];  Service: Cardiovascular;  Laterality: N/A;  . ESOPHAGOGASTRODUODENOSCOPY  12/22/2011   Procedure: ESOPHAGOGASTRODUODENOSCOPY (EGD);  Surgeon:  Gatha Mayer, MD;  Location: [facility];  Service: Endoscopy;  Laterality: N/A;  . INSERT / REPLACE / REMOVE PACEMAKER  06/29/2006   Medtronic adapta  . REFRACTIVE SURGERY Bilateral     Current Medications: Outpatient Medications Prior to Visit  Medication Sig Dispense Refill  . ACCU-CHEK FASTCLIX LANCETS MISC Use to check sugar 2 times daily 200 each 11  . acetaminophen (TYLENOL) 500 MG tablet Take 500 mg by mouth every 6 (six) hours as needed (pain).    Marland Kitchen albuterol (PROAIR HFA) 108 (90 Base) MCG/ACT inhaler Inhale 2 puffs into the lungs every 6 (six) hours as needed for wheezing or shortness of breath.    . AMITIZA 24 MCG capsule Take 1 capsule (24 mcg total) by mouth 2 (two) times daily with a meal. 60 capsule 3  . atropine 1 % ophthalmic solution Place 1 drop into both eyes daily as needed (for burning).   0  . Blood Glucose Monitoring Suppl (ACCU-CHEK AVIVA PLUS) w/Device KIT Use to check sugars  2 times daily. 1 kit 0  . donepezil (ARICEPT) 5 MG tablet Take 5 mg by mouth at bedtime.   0  . folic acid (FOLVITE) 1 MG tablet TAKE 1 TABLET BY MOUTH EVERY DAY (Patient taking differently: Take 1 mg by mouth daily. ) 30 tablet 5  . furosemide (LASIX) 20 MG tablet Take 2 tablets (40 mg total) by mouth 2 (two) times a week. (Patient taking differently: Take 40 mg by mouth See admin instructions. Take 40 mg by mouth 2 times a week- Mondays and Thursdays) 90 tablet 3  . gabapentin (NEURONTIN) 300 MG capsule Take 300 mg by mouth 2 (two) times daily.  2  . glipiZIDE (GLUCOTROL) 5 MG tablet Take 1/2 tablet when blood sugar goes over 200 (Patient taking differently: Take 2.5 mg by mouth daily as needed (for a BGL of 200 or greater). ) 45 tablet 1  . guaifenesin (MUCUS RELIEF CHEST CONGESTION) 400 MG TABS tablet Take 400 mg by mouth 2 (two) times daily as needed (cough/ congestion).    Marland Kitchen ketoconazole (NIZORAL) 2 % cream Apply 1 application topically as needed (for irritation or breakouts under the  breasts).   3  . levalbuterol (XOPENEX) 0.63 MG/3ML nebulizer solution Take 3 mLs by nebulization 3 (three) times daily as needed for wheezing or shortness of breath.   0  . loratadine (CLARITIN) 10 MG tablet Take 10 mg by mouth daily.    . magnesium citrate SOLN Take 1 Bottle by mouth once as needed for mild constipation or moderate constipation.     . meclizine (ANTIVERT) 25 MG tablet Take 25 mg by mouth See admin instructions. Take 25 mg by mouth two times a day, every other day, as needed for vertigo    . montelukast (SINGULAIR) 10 MG tablet Take 10 mg by mouth daily.     Marland Kitchen NUTRITIONAL SUPPLEMENTS PO Enterex Diabetic Nutritional Beverage: Drink 1 bottle by mouth once a day for supplementation when appetite lessens    . ofloxacin (OCUFLOX) 0.3 % ophthalmic solution Place 1 drop into both eyes 4 (four) times daily as needed (for drainage).   0  . omeprazole (PRILOSEC) 40 MG capsule TAKE 1 CAPSULE BY MOUTH TWICE A DAY (Patient taking differently: Take 40 mg by mouth 2 (two) times daily. ) 60 capsule 3  . ONETOUCH VERIO test strip USE TO TEST BLOOD SUGAR TWICE A DAY (Patient taking differently: 1 each by Other route 2 (two) times daily. ) 100 each 3  . OVER THE COUNTER MEDICATION Kidney supplement capsules (Choline 100 mg, trimethylglycine 50 mg, glutathione 50 mg, methyltetrahydrofolate, l-arginine-, l-ornithine, l-citruline): Take 1 capsule by mouth two to three times a week    . Polyvinyl Alcohol-Povidone (REFRESH OP) Place 1 drop into both eyes daily as needed (dry eyes).     . Potassium Chloride CR (MICRO-K) 8 MEQ CPCR capsule CR Take 1 capsule on Mondays and Thursdays, the days you take your Lasix. (Patient taking differently: Take 8 mEq by mouth See admin instructions. Take 8 mEq by mouth two times a week- Mondays and Thursdays) 90 capsule 3  . rosuvastatin (CRESTOR) 20 MG tablet Take 1 tablet (20 mg total) by mouth daily. (Patient taking differently: Take 20 mg by mouth at bedtime. ) 90 tablet 1   . SYNTHROID 125 MCG tablet TAKE 1/2 TABLET BY MOUTH EVERY DAY (Patient taking differently: Take 62.5 mcg by mouth daily before breakfast. ) 45 tablet 2  . tamsulosin (FLOMAX) 0.4 MG CAPS capsule Take  1 capsule (0.4 mg total) by mouth daily after supper. 30 capsule 0  . traMADol (ULTRAM) 50 MG tablet Take 1 tablet (50 mg total) by mouth 2 (two) times daily as needed for moderate pain.    Marland Kitchen triamcinolone cream (KENALOG) 0.1 % Apply 1 application topically 2 (two) times daily as needed (for skin "flares").   1  . zolpidem (AMBIEN) 10 MG tablet Take 5-10 mg by mouth at bedtime as needed for sleep.   0   No facility-administered medications prior to visit.      Allergies:   Macrobid [nitrofurantoin]; Darvon; Digoxin and related; Penicillins; Percocet [oxycodone-acetaminophen]; Percodan [oxycodone-aspirin]; and Vicodin [hydrocodone-acetaminophen]   Social History   Socioeconomic History  . Marital status: Widowed    Spouse name: Not on file  . Number of children: 10  . Years of education: Not on file  . Highest education level: Not on file  Occupational History    Employer: RETIRED  Social Needs  . Financial resource strain: Not on file  . Food insecurity:    Worry: Not on file    Inability: Not on file  . Transportation needs:    Medical: Not on file    Non-medical: Not on file  Tobacco Use  . Smoking status: Never Smoker  . Smokeless tobacco: Never Used  Substance and Sexual Activity  . Alcohol use: No    Alcohol/week: 0.0 standard drinks  . Drug use: No  . Sexual activity: Never  Lifestyle  . Physical activity:    Days per week: Not on file    Minutes per session: Not on file  . Stress: Not on file  Relationships  . Social connections:    Talks on phone: Not on file    Gets together: Not on file    Attends religious service: Not on file    Active member of club or organization: Not on file    Attends meetings of clubs or organizations: Not on file    Relationship  status: Not on file  Other Topics Concern  . Not on file  Social History Narrative  . Not on file     Family History:  The patient's family history includes Breast cancer in her daughter; Colon polyps in her daughter; Diabetes in her brother, daughter, father, and son; Heart attack in her brother; Heart disease in her father; Stroke in her sister.   ROS:   Please see the history of present illness.    ROS all other systems are reviewed and are negative  PHYSICAL EXAM:   VS:  BP (!) 75/48 (BP Location: Left Arm, Patient Position: Sitting, Cuff Size: Large)   Pulse 82   Ht _0  (1.626 m)   Wt 164 lb (74.4 kg)   BMI 28.15 kg/m      General: Sleepy but easy to wake, oriented x3, no distress, overweight, healthy left subclavian pacemaker site Head: no evidence of trauma, PERRL, EOMI, no exophtalmos or lid lag, no myxedema, no xanthelasma; normal ears, nose and oropharynx Neck: normal jugular venous pulsations and no hepatojugular reflux; brisk carotid pulses without delay and no carotid bruits Chest: clear to auscultation, no signs of consolidation by percussion or palpation, normal fremitus, symmetrical and full respiratory excursions Cardiovascular: normal position and quality of the apical impulse, regular rhythm, normal first and paradoxically split second heart sounds, no murmurs, rubs or gallops Abdomen: no tenderness or distention, no masses by palpation, no abnormal pulsatility or arterial bruits, normal bowel sounds, no hepatosplenomegaly  Extremities: no clubbing, cyanosis or edema; 2+ radial, ulnar and brachial pulses bilaterally; 2+ right femoral, posterior tibial and dorsalis pedis pulses; 2+ left femoral, posterior tibial and dorsalis pedis pulses; no subclavian or femoral bruits Neurological: grossly nonfocal Psych: Normal mood and affect    Wt Readings from Last 3 Encounters:  10/29/17 164 lb (74.4 kg)  10/19/17 179 lb 0.2 oz (81.2 kg)  09/26/17 167 lb (75.8 kg)       Studies/Labs Reviewed:   EKG:  EKG is not ordered today.  The intracardiac electrogram shows atrial sensed, ventricular paced rhythm  Recent Labs: 05/10/2017: TSH 1.36 09/10/2017: B Natriuretic Peptide 264.4 09/14/2017: Magnesium 1.8 10/17/2017: ALT 8 10/19/2017: BUN 10; Creatinine, Ser 1.59; Hemoglobin 9.3; Platelets 147; Potassium 3.9; Sodium 140   Lipid Panel    Component Value Date/Time   CHOL 154 05/10/2017 1538   TRIG 214.0 (H) 05/10/2017 1538   HDL 31.30 (L) 05/10/2017 1538   CHOLHDL 5 05/10/2017 1538   VLDL 42.8 (H) 05/10/2017 1538   LDLCALC 141 (H) 10/25/2015 1118   LDLDIRECT 91.0 05/10/2017 1538     ASSESSMENT:    1. PAF (paroxysmal atrial fibrillation) ([place])   2. Chronic combined systolic and diastolic heart failure ([place])   3. Coronary artery disease involving coronary bypass graft of native heart without angina pectoris   4. NSVT (nonsustained ventricular tachycardia) ([place])   5. CHB (complete heart block) ([facility])   6. Pacemaker   7. Hypercholesterolemia   8. Type 2 diabetes mellitus with complication, without long-term current use of insulin ([place])   9. Medication management      PLAN:  In order of problems listed above:  1. CHF: I suspect that Mrs. Carbajal is hypovolemic and this is the major reason for her hypotension.  We will hold her furosemide and potassium supplement and also hold her tamsulosin.  Need to reassess her "dry weight" since I believe she is lost some real weight, asked her to call us back with her blood pressure and weight in 48 hours. 2. Afib: Is been long-term since been documented true atrial fibrillation burden of PAT has diminished considerably over the last 2 months.  Unfortunately she is at very high embolic risk. CHADSVasc 41 (age 38, CVA 2, CHF, CAD, previous HTN, DM).  Due to increasing difficulty with maintaining therapeutic INR was switched to an oral anticoagulant.  Started on Eliquis and dose adjusted for advanced age and poor renal  function (her usual creatinine is over 1.5). 3. CAD s/p CABG: no recent problems with angina pectoris, known reversible ischemia on nuclear stress test and previous non-STEMI.  LVEF dropping, probably due to new ischemic territory in the LAD artery distribution. Continue statin.  Off aspirin due to GI bleeding and concomitant treatment with warfarin. I still believe that she is not a candidate for invasive evaluation. 4. NSVT: The episodes occur on the average once or twice a month and can be lengthy up to 18 beats, but are asymptomatic.  Sustained VT has not been recorded. 5. CHB: She is pacemaker dependent.   6. PPM: Normal check today. Continue q3 month device downloads.  Note the unusual pattern on her EKGs with positive R waves in lead V1, V2. Reviewed radiological images and the right ventricular lead is clearly in the right ventricular cavity, not the epicardial veins. It's possible that the tip of the lead is located epicardially/microperforation. 7. HLP: She is on high-dose rosuvastatin.   8. DM: Excellent control, last hemoglobin A1c 6.5%.  Medication Adjustments/Labs and Tests Ordered: Current medicines are reviewed at length with the patient today.  Concerns regarding medicines are outlined above.  Medication changes, Labs and Tests ordered today are listed in the Patient Instructions below. Patient Instructions  Medication Instructions:  Dr Sallyanne Kuster has recommended making the following medication changes: 1. HOLD Furosemide 2. HOLD Potassium 3. HOLD Flomax 4. STOP Coumadin 5. START Eliquis 2.5 mg - take 1 tablet twice daily  If you need a refill on your cardiac medications before your next appointment, please call your pharmacy.   Lab work: Your physician recommends that you return for lab work in 2 weeks.  If you have labs (blood work) drawn today and your tests are completely normal, you will receive your results only by: Marland Kitchen MyChart Message (if you have MyChart) OR . A  paper copy in the mail If you have any lab test that is abnormal or we need to change your treatment, we will call you to review the results.  Testing/Procedures: NONE ORDERED  Follow-Up: At [facility], you and your health needs are our priority.  As part of our continuing mission to provide you with exceptional heart care, we have created designated Provider Care Teams.  These Care Teams include your primary Cardiologist (physician) and Advanced Practice Providers (APPs -  Physician Assistants and Nurse Practitioners) who all work together to provide you with the care you need, when you need it.  Dr Sallyanne Kuster recommends that you schedule a follow-up appointment in 2-3 weeks with a NP/PA.  You will need a follow up appointment in 6 months.  Please call our office 2 months in advance to schedule this appointment.  You may see Sanda Klein, MD or one of the following Advanced Practice Providers on your designated Care Team: [place], [place] . Fabian Sharp, PA-C  Any Other Special Instructions Will Be Listed Below (If Applicable).  Weigh daily.  Check blood pressure daily.  Call the office, [phone], on Wednesday with your weight and your blood pressure to talk about restarting the furosemide, potassium, and flomax.    Signed, Sanda Klein, MD  10/30/2017 8:32 AM    [facility] [place], [place], [place]  [address] Phone: [phone]; Fax: [phone]

## 2017-10-29 NOTE — Patient Instructions (Addendum)
Medication Instructions:  Dr Sallyanne Kuster has recommended making the following medication changes: 1. HOLD Furosemide 2. HOLD Potassium 3. HOLD Flomax 4. STOP Coumadin 5. START Eliquis 2.5 mg - take 1 tablet twice daily  If you need a refill on your cardiac medications before your next appointment, please call your pharmacy.   Lab work: Your physician recommends that you return for lab work in 2 weeks.  If you have labs (blood work) drawn today and your tests are completely normal, you will receive your results only by: Marland Kitchen MyChart Message (if you have MyChart) OR . A paper copy in the mail If you have any lab test that is abnormal or we need to change your treatment, we will call you to review the results.  Testing/Procedures: NONE ORDERED  Follow-Up: At [facility], you and your health needs are our priority.  As part of our continuing mission to provide you with exceptional heart care, we have created designated Provider Care Teams.  These Care Teams include your primary Cardiologist (physician) and Advanced Practice Providers (APPs -  Physician Assistants and Nurse Practitioners) who all work together to provide you with the care you need, when you need it.  Dr Sallyanne Kuster recommends that you schedule a follow-up appointment in 2-3 weeks with a NP/PA.  You will need a follow up appointment in 6 months.  Please call our office 2 months in advance to schedule this appointment.  You may see Sanda Klein, MD or one of the following Advanced Practice Providers on your designated Care Team: [place], [place] . Fabian Sharp, PA-C  Any Other Special Instructions Will Be Listed Below (If Applicable).  Weigh daily.  Check blood pressure daily.  Call the office, [phone], on Wednesday with your weight and your blood pressure to talk about restarting the furosemide, potassium, and flomax.

## 2017-10-30 DIAGNOSIS — I10 Essential (primary) hypertension: Secondary | ICD-10-CM | POA: Diagnosis not present

## 2017-10-30 DIAGNOSIS — N390 Urinary tract infection, site not specified: Secondary | ICD-10-CM | POA: Diagnosis not present

## 2017-10-30 DIAGNOSIS — I472 Ventricular tachycardia: Secondary | ICD-10-CM | POA: Insufficient documentation

## 2017-10-30 DIAGNOSIS — I5032 Chronic diastolic (congestive) heart failure: Secondary | ICD-10-CM | POA: Diagnosis not present

## 2017-10-30 DIAGNOSIS — J45901 Unspecified asthma with (acute) exacerbation: Secondary | ICD-10-CM | POA: Diagnosis not present

## 2017-10-30 DIAGNOSIS — I4729 Other ventricular tachycardia: Secondary | ICD-10-CM | POA: Insufficient documentation

## 2017-10-31 ENCOUNTER — Ambulatory Visit (INDEPENDENT_AMBULATORY_CARE_PROVIDER_SITE_OTHER): Payer: Medicare HMO | Admitting: Pharmacist

## 2017-10-31 ENCOUNTER — Telehealth: Payer: Self-pay | Admitting: Cardiovascular Disease

## 2017-10-31 DIAGNOSIS — Z7901 Long term (current) use of anticoagulants: Secondary | ICD-10-CM

## 2017-10-31 DIAGNOSIS — I10 Essential (primary) hypertension: Secondary | ICD-10-CM | POA: Diagnosis not present

## 2017-10-31 DIAGNOSIS — J45901 Unspecified asthma with (acute) exacerbation: Secondary | ICD-10-CM | POA: Diagnosis not present

## 2017-10-31 DIAGNOSIS — N390 Urinary tract infection, site not specified: Secondary | ICD-10-CM | POA: Diagnosis not present

## 2017-10-31 DIAGNOSIS — I5032 Chronic diastolic (congestive) heart failure: Secondary | ICD-10-CM | POA: Diagnosis not present

## 2017-10-31 LAB — POCT INR: INR: 1.9 — AB (ref 2.0–3.0)

## 2017-10-31 NOTE — Telephone Encounter (Signed)
BP better, continue holding diuretic until weight over 166. MCR

## 2017-10-31 NOTE — Telephone Encounter (Signed)
Per OV on 10/14 daughter was instructed to try pt's BP and weight and call back with readings in 2 days. Results listed below.  Pt's daughter is calling to give vitals on the pt: 10/14 pm BP: 93/55 WT: 164 10/15 am BP: 103/57  WT: 162 / pm BP:94/51  10/16 am BP: 91/56 WT: 162  Will route to to Dr. Loletha Grayer to review.

## 2017-10-31 NOTE — Telephone Encounter (Signed)
New Message  Pt's daughter is calling to give vitals on the pt: 10/14 pm BP: 93/55 10/15 am BP: 103/57  WT: 162 / pm BP:94/51  10/16 am BP: 91/56 WT: 162

## 2017-11-01 ENCOUNTER — Other Ambulatory Visit: Payer: Self-pay

## 2017-11-01 DIAGNOSIS — J45901 Unspecified asthma with (acute) exacerbation: Secondary | ICD-10-CM | POA: Diagnosis not present

## 2017-11-01 DIAGNOSIS — I5032 Chronic diastolic (congestive) heart failure: Secondary | ICD-10-CM | POA: Diagnosis not present

## 2017-11-01 DIAGNOSIS — N390 Urinary tract infection, site not specified: Secondary | ICD-10-CM | POA: Diagnosis not present

## 2017-11-01 DIAGNOSIS — I10 Essential (primary) hypertension: Secondary | ICD-10-CM | POA: Diagnosis not present

## 2017-11-01 NOTE — Telephone Encounter (Signed)
Returned call to patient's daughter [name] no answer.[place].

## 2017-11-01 NOTE — Telephone Encounter (Signed)
Received call from patient's daughter [name] Dr.Croitoru's recommendation given.

## 2017-11-01 NOTE — Patient Outreach (Signed)
[facility]) Care Management  11/01/2017  [name] July 15, 1920 [id number]   EMMI- Heart Failure RED ON EMMI ALERT Day # 9 Date: 10/31/17 Red Alert Reason:  Filled new prescriptions? Yes  Outreach attempt: Spoke with daughter [name].  She states that patient is doing good.  She states that patient got her new prescriptions for UTI so that was the reason for the new medication response from [facility].  She states that her mother's weight is 162 lbs.  She denies any other questions or concerns at this time.     Plan: RN CM will close case.    Jone Baseman, RN, MSN [facility] Care Management Care Management Coordinator Direct Line [phone] Toll Free: [phone]  Fax: [phone]

## 2017-11-02 DIAGNOSIS — R300 Dysuria: Secondary | ICD-10-CM | POA: Diagnosis not present

## 2017-11-02 DIAGNOSIS — N3020 Other chronic cystitis without hematuria: Secondary | ICD-10-CM | POA: Diagnosis not present

## 2017-11-05 ENCOUNTER — Telehealth: Payer: Self-pay | Admitting: Gastroenterology

## 2017-11-05 ENCOUNTER — Other Ambulatory Visit: Payer: Self-pay

## 2017-11-05 NOTE — Patient Outreach (Signed)
[facility]) Care Management  11/05/2017  [name] 10-21-1920 [id number]   EMMI- Heart Failure RED ON EMMI ALERT Day # 11 Date: 11/02/17 Red Alert Reason: New/worsening problems? Yes  Outreach attempt: spoke with daughter [name].  She is able to verify HIPAA. Addressed red alert.  She states that patient saw the urologist on Friday and no further bacteria growth was found in her urine.  So she will complete her last two doses of antibiotic.  She states that urologist has recommended that her mom start cranberry pills to help possibly prevent UTI's.  She states she has a follow up in 6 weeks.  She states that patient weight was 161 lbs.  She denies any other needs or questions.    Plan: RN CM will close case.    Jone Baseman, RN, MSN [facility] Care Management Care Management Coordinator Direct Line [phone] Toll Free: [phone]  Fax: [phone]

## 2017-11-05 NOTE — Telephone Encounter (Signed)
Advised the probiotic may not be effective. Coupon given to her. Samples of Amitiza 24 mcg also for pick up.

## 2017-11-06 DIAGNOSIS — J45901 Unspecified asthma with (acute) exacerbation: Secondary | ICD-10-CM | POA: Diagnosis not present

## 2017-11-06 DIAGNOSIS — I5032 Chronic diastolic (congestive) heart failure: Secondary | ICD-10-CM | POA: Diagnosis not present

## 2017-11-06 DIAGNOSIS — I10 Essential (primary) hypertension: Secondary | ICD-10-CM | POA: Diagnosis not present

## 2017-11-06 DIAGNOSIS — N390 Urinary tract infection, site not specified: Secondary | ICD-10-CM | POA: Diagnosis not present

## 2017-11-07 DIAGNOSIS — J45901 Unspecified asthma with (acute) exacerbation: Secondary | ICD-10-CM | POA: Diagnosis not present

## 2017-11-07 DIAGNOSIS — N390 Urinary tract infection, site not specified: Secondary | ICD-10-CM | POA: Diagnosis not present

## 2017-11-07 DIAGNOSIS — I5032 Chronic diastolic (congestive) heart failure: Secondary | ICD-10-CM | POA: Diagnosis not present

## 2017-11-07 DIAGNOSIS — I10 Essential (primary) hypertension: Secondary | ICD-10-CM | POA: Diagnosis not present

## 2017-11-07 LAB — POCT INR: INR: 2.5 (ref 2.0–3.0)

## 2017-11-08 ENCOUNTER — Ambulatory Visit (INDEPENDENT_AMBULATORY_CARE_PROVIDER_SITE_OTHER): Payer: Medicare HMO | Admitting: Pharmacist Clinician (PhC)/ Clinical Pharmacy Specialist

## 2017-11-08 DIAGNOSIS — Z7901 Long term (current) use of anticoagulants: Secondary | ICD-10-CM

## 2017-11-08 DIAGNOSIS — I5032 Chronic diastolic (congestive) heart failure: Secondary | ICD-10-CM | POA: Diagnosis not present

## 2017-11-08 DIAGNOSIS — I10 Essential (primary) hypertension: Secondary | ICD-10-CM | POA: Diagnosis not present

## 2017-11-08 DIAGNOSIS — J45901 Unspecified asthma with (acute) exacerbation: Secondary | ICD-10-CM | POA: Diagnosis not present

## 2017-11-08 DIAGNOSIS — N390 Urinary tract infection, site not specified: Secondary | ICD-10-CM | POA: Diagnosis not present

## 2017-11-09 DIAGNOSIS — N390 Urinary tract infection, site not specified: Secondary | ICD-10-CM | POA: Diagnosis not present

## 2017-11-09 DIAGNOSIS — I5032 Chronic diastolic (congestive) heart failure: Secondary | ICD-10-CM | POA: Diagnosis not present

## 2017-11-09 DIAGNOSIS — J45901 Unspecified asthma with (acute) exacerbation: Secondary | ICD-10-CM | POA: Diagnosis not present

## 2017-11-09 DIAGNOSIS — I10 Essential (primary) hypertension: Secondary | ICD-10-CM | POA: Diagnosis not present

## 2017-11-11 ENCOUNTER — Other Ambulatory Visit: Payer: Self-pay | Admitting: Cardiology

## 2017-11-11 ENCOUNTER — Other Ambulatory Visit: Payer: Self-pay | Admitting: Gastroenterology

## 2017-11-12 ENCOUNTER — Ambulatory Visit (INDEPENDENT_AMBULATORY_CARE_PROVIDER_SITE_OTHER): Payer: Medicare HMO | Admitting: Pharmacist

## 2017-11-12 ENCOUNTER — Ambulatory Visit: Payer: Medicare HMO | Admitting: Physician Assistant

## 2017-11-12 ENCOUNTER — Encounter: Payer: Self-pay | Admitting: Physician Assistant

## 2017-11-12 VITALS — BP 73/51 | HR 82 | Wt 161.0 lb

## 2017-11-12 DIAGNOSIS — I959 Hypotension, unspecified: Secondary | ICD-10-CM

## 2017-11-12 DIAGNOSIS — I480 Paroxysmal atrial fibrillation: Secondary | ICD-10-CM

## 2017-11-12 DIAGNOSIS — I5042 Chronic combined systolic (congestive) and diastolic (congestive) heart failure: Secondary | ICD-10-CM | POA: Diagnosis not present

## 2017-11-12 DIAGNOSIS — Z950 Presence of cardiac pacemaker: Secondary | ICD-10-CM | POA: Diagnosis not present

## 2017-11-12 DIAGNOSIS — E119 Type 2 diabetes mellitus without complications: Secondary | ICD-10-CM

## 2017-11-12 DIAGNOSIS — E785 Hyperlipidemia, unspecified: Secondary | ICD-10-CM

## 2017-11-12 DIAGNOSIS — N183 Chronic kidney disease, stage 3 unspecified: Secondary | ICD-10-CM

## 2017-11-12 DIAGNOSIS — Z7901 Long term (current) use of anticoagulants: Secondary | ICD-10-CM | POA: Diagnosis not present

## 2017-11-12 DIAGNOSIS — I82409 Acute embolism and thrombosis of unspecified deep veins of unspecified lower extremity: Secondary | ICD-10-CM

## 2017-11-12 DIAGNOSIS — I25810 Atherosclerosis of coronary artery bypass graft(s) without angina pectoris: Secondary | ICD-10-CM | POA: Diagnosis not present

## 2017-11-12 LAB — POCT INR: INR: 8 — AB (ref 2.0–3.0)

## 2017-11-12 MED ORDER — PHYTONADIONE 5 MG PO TABS: 5.0000 mg | ORAL_TABLET | Freq: Once | ORAL | 0 refills | Status: AC

## 2017-11-12 MED ORDER — PHYTONADIONE 5 MG PO TABS: 5.0000 mg | ORAL_TABLET | Freq: Once | ORAL | 0 refills | Status: DC

## 2017-11-12 NOTE — Progress Notes (Signed)
Cardiology Office Note    Date:  11/12/2017   ID:  [name], DOB [birth year], MRN [id number]  PCP:  [name], MD  Cardiologist:  Dr. Sallyanne Kuster   Chief Complaint  Patient presents with  . Follow-up    seen for Dr. Sallyanne Kuster.     History of Present Illness:  [name] is a 84 y.o. 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  Her ejection fraction has been up and down over the years.  Echocardiogram in December 2013 showed EF 55 to 65%.  By August 2016, EF has reduced down to 30 to 35%.  EF improved to 45 to 50% by June 2017.  Patient was admitted with small NSTEMI in July 2018, this was treated conservatively in view of advanced age and renal insufficiency.  She is not on any aspirin due to history of GI bleed and it did need for Coumadin.  Patient was admitted in late August 2019 with dyspnea.  Chest x-ray suggested CHF.  BNP was 264.  She was admitted and underwent gentle diuresis.  After diuresis, her weight has been maintained between 165 to 168 pounds.  Most recent echocardiogram obtained on 09/11/2017 showed EF 25 to 30%, severe anterior and anterolateral hypokinesis, apical septum akinesis, apical inferior akinesis, akinesis of the true apex, grade 1 DD, mild MR. Patient was readmitted in early October with supratherapeutic INR and hypokalemia.  INR was 6.83 without sign of bleeding.  Hypokalemia was felt to be related to increase diuretic dose.  Patient was last seen by Dr. Sallyanne Kuster on 10/29/2017, she was hypotensive at that time.  It was felt that she was likely hypovolemic, Lasix and potassium supplement were held along with tamsulosin.  Patient presents today along with her daughter.  Based on home blood pressure reading, her systolic blood pressure has been in the 90s to low 100 range.  There was  one time that her blood pressure was 88.  Today however her blood pressure is down to the 70s again.  Patient complains of diffuse abdominal discomfort.  His daughter is trying to set him up to see GI service.  Looking back, his blood pressure has been chronically low since last year.  During the last office 3 office visit, his systolic blood pressure has been in the 70s.  However given the abdominal discomfort and lack of appetite, I am more concerned about hypoperfusion and cardiogenic shock.  I will obtain CBC, basic metabolic panel and PT/INR.  If renal function significantly worsened, I have very low threshold to send the patient to the hospital.  I think end-of-life discussion need to be held with the patient.  Both her long-term and short-term prognosis is fairly poor.  Despite holding all diuretic, she has not had any significant heart failure symptoms.  I will remove Lasix, potassium and tamsulosin for her medication list.  Note, she is no longer on Eliquis, she has been restarted on Coumadin.  Last Coumadin level was 2.5, however given the recent supratherapeutic INR, I will obtain PT/INR.  I think her life expectancy is less than 6 months.    Past Medical History:  Diagnosis Date  . 2nd degree atrioventricular block    a. 06/22/15 Gen change: MDT ADDR01 Adapta, DC PPM (ser# [id number]).  . Allergy to perfume   . Anemia   .  Arthritis   . Chronic combined systolic and diastolic CHF (congestive heart failure) ([place])    a. 01/2014 Echo: EF @ least mod-sev reduced with HK of lat/apical, basal inf walls. basalpost AK, Gr 1DD; b. 06/2015 Echo: EF 45-50%, Gr2 DD, mod LVH.   . CKD (chronic kidney disease), stage III ([place])    a. iii - iv.  . Coronary artery disease    a. 1994 s/p cabg;  b. 09/2013 MV: large, sev intensity, partially reversible inf, apical defect, prior inf/ap infarct w/ mild peri-infarct ischemia->Med Rx; c. 06/2015 NSTEMI (trop 6)->Med rx.  . Daily headache   . DVT of upper extremity  (deep vein thrombosis) ([place]) 06/13/2012   BUE  . Dyspepsia   . Gastric ulcer   . GERD (gastroesophageal reflux disease)   . H/O: GI bleed 12//13  . Hiatal hernia   . High cholesterol   . History of blood transfusion 2013  . Hypertensive heart disease   . Hypothyroid   . Legally blind   . PAF (paroxysmal atrial fibrillation) ([facility])    a. CHA2DS2VASc = 7-->coumadin.  . Presence of permanent cardiac pacemaker    a. 06/22/15 Gen change: MDT ADDR01 Adapta, DC PPM (ser# [id number]).  . Seasonal allergies   . Type II diabetes mellitus ([place])   . UTI (lower urinary tract infection)     Past Surgical History:  Procedure Laterality Date  . CARDIAC CATHETERIZATION  10/25/92  . CARDIAC CATHETERIZATION  11/18/03   w/grafts 100%CX LAD 80 & 100%  . CARDIAC CATHETERIZATION  01/24/05   diffuse disease of native vessels  . CARDIAC CATHETERIZATION  06/06/06   severe native CAD  . CARDIOVERSION  06/06/06   successful  . CATARACT EXTRACTION W/ INTRAOCULAR LENS  IMPLANT, BILATERAL Bilateral   . CORONARY ARTERY BYPASS GRAFT  10/27/92   LIMA to LAD,SVG to LAD second diagonal,obtuse maraginal of the CX and posterior descendingbranch of the RCA  . EP IMPLANTABLE DEVICE N/A 06/22/2015   Procedure: PPM/BIV PPM Generator Changeout;  Surgeon: Sanda Klein, MD;  Location: [facility];  Service: Cardiovascular;  Laterality: N/A;  . ESOPHAGOGASTRODUODENOSCOPY  12/22/2011   Procedure: ESOPHAGOGASTRODUODENOSCOPY (EGD);  Surgeon: Gatha Mayer, MD;  Location: [facility];  Service: Endoscopy;  Laterality: N/A;  . INSERT / REPLACE / REMOVE PACEMAKER  06/29/2006   Medtronic adapta  . REFRACTIVE SURGERY Bilateral     Current Medications: Outpatient Medications Prior to Visit  Medication Sig Dispense Refill  . warfarin (COUMADIN) 3 MG tablet Take 3 mg by mouth daily.    Marland Kitchen ACCU-CHEK FASTCLIX LANCETS MISC Use to check sugar 2 times daily 200 each 11  . acetaminophen (TYLENOL) 500 MG tablet Take 500 mg by mouth  every 6 (six) hours as needed (pain).    Marland Kitchen albuterol (PROAIR HFA) 108 (90 Base) MCG/ACT inhaler Inhale 2 puffs into the lungs every 6 (six) hours as needed for wheezing or shortness of breath.    . AMITIZA 24 MCG capsule TAKE 1 CAPSULE (24 MCG TOTAL) BY MOUTH 2 (TWO) TIMES DAILY WITH A MEAL. 60 capsule 3  . atropine 1 % ophthalmic solution Place 1 drop into both eyes daily as needed (for burning).   0  . Blood Glucose Monitoring Suppl (ACCU-CHEK AVIVA PLUS) w/Device KIT Use to check sugars 2 times daily. 1 kit 0  . donepezil (ARICEPT) 5 MG tablet Take 5 mg by mouth at bedtime.   0  . folic acid (FOLVITE) 1 MG tablet TAKE 1 TABLET  BY MOUTH EVERY DAY (Patient taking differently: Take 1 mg by mouth daily. ) 30 tablet 5  . gabapentin (NEURONTIN) 300 MG capsule Take 300 mg by mouth 2 (two) times daily.  2  . glipiZIDE (GLUCOTROL) 5 MG tablet Take 1/2 tablet when blood sugar goes over 200 (Patient taking differently: Take 2.5 mg by mouth daily as needed (for a BGL of 200 or greater). ) 45 tablet 1  . guaifenesin (MUCUS RELIEF CHEST CONGESTION) 400 MG TABS tablet Take 400 mg by mouth 2 (two) times daily as needed (cough/ congestion).    Marland Kitchen ketoconazole (NIZORAL) 2 % cream Apply 1 application topically as needed (for irritation or breakouts under the breasts).   3  . levalbuterol (XOPENEX) 0.63 MG/3ML nebulizer solution Take 3 mLs by nebulization 3 (three) times daily as needed for wheezing or shortness of breath.   0  . loratadine (CLARITIN) 10 MG tablet Take 10 mg by mouth daily.    . magnesium citrate SOLN Take 1 Bottle by mouth once as needed for mild constipation or moderate constipation.     . meclizine (ANTIVERT) 25 MG tablet Take 25 mg by mouth See admin instructions. Take 25 mg by mouth two times a day, every other day, as needed for vertigo    . montelukast (SINGULAIR) 10 MG tablet Take 10 mg by mouth daily.     Marland Kitchen NUTRITIONAL SUPPLEMENTS PO Enterex Diabetic Nutritional Beverage: Drink 1 bottle by  mouth once a day for supplementation when appetite lessens    . ofloxacin (OCUFLOX) 0.3 % ophthalmic solution Place 1 drop into both eyes 4 (four) times daily as needed (for drainage).   0  . omeprazole (PRILOSEC) 40 MG capsule TAKE 1 CAPSULE BY MOUTH TWICE A DAY (Patient taking differently: Take 40 mg by mouth 2 (two) times daily. ) 60 capsule 3  . ONETOUCH VERIO test strip USE TO TEST BLOOD SUGAR TWICE A DAY (Patient taking differently: 1 each by Other route 2 (two) times daily. ) 100 each 3  . OVER THE COUNTER MEDICATION Kidney supplement capsules (Choline 100 mg, trimethylglycine 50 mg, glutathione 50 mg, methyltetrahydrofolate, l-arginine-, l-ornithine, l-citruline): Take 1 capsule by mouth two to three times a week    . Polyvinyl Alcohol-Povidone (REFRESH OP) Place 1 drop into both eyes daily as needed (dry eyes).     . rosuvastatin (CRESTOR) 20 MG tablet Take 1 tablet (20 mg total) by mouth daily. (Patient taking differently: Take 20 mg by mouth at bedtime. ) 90 tablet 1  . SYNTHROID 125 MCG tablet TAKE 1/2 TABLET BY MOUTH EVERY DAY (Patient taking differently: Take 62.5 mcg by mouth daily before breakfast. ) 45 tablet 2  . traMADol (ULTRAM) 50 MG tablet Take 1 tablet (50 mg total) by mouth 2 (two) times daily as needed for moderate pain.    Marland Kitchen triamcinolone cream (KENALOG) 0.1 % Apply 1 application topically 2 (two) times daily as needed (for skin "flares").   1  . zolpidem (AMBIEN) 10 MG tablet Take 5-10 mg by mouth at bedtime as needed for sleep.   0  . apixaban (ELIQUIS) 2.5 MG TABS tablet Take 1 tablet (2.5 mg total) by mouth 2 (two) times daily. (Patient not taking: Reported on 11/12/2017) 60 tablet 5  . furosemide (LASIX) 20 MG tablet Take 2 tablets (40 mg total) by mouth 2 (two) times a week. (Patient taking differently: Take 40 mg by mouth See admin instructions. Take 40 mg by mouth 2 times a week- Mondays  and Thursdays) 90 tablet 3  . Potassium Chloride CR (MICRO-K) 8 MEQ CPCR capsule CR  Take 1 capsule on Mondays and Thursdays, the days you take your Lasix. (Patient taking differently: Take 8 mEq by mouth See admin instructions. Take 8 mEq by mouth two times a week- Mondays and Thursdays) 90 capsule 3  . tamsulosin (FLOMAX) 0.4 MG CAPS capsule Take 1 capsule (0.4 mg total) by mouth daily after supper. 30 capsule 0   No facility-administered medications prior to visit.      Allergies:   Macrobid [nitrofurantoin]; Darvon; Digoxin and related; Penicillins; Percocet [oxycodone-acetaminophen]; Percodan [oxycodone-aspirin]; and Vicodin [hydrocodone-acetaminophen]   Social History   Socioeconomic History  . Marital status: Widowed    Spouse name: Not on file  . Number of children: 10  . Years of education: Not on file  . Highest education level: Not on file  Occupational History    Employer: RETIRED  Social Needs  . Financial resource strain: Not on file  . Food insecurity:    Worry: Not on file    Inability: Not on file  . Transportation needs:    Medical: Not on file    Non-medical: Not on file  Tobacco Use  . Smoking status: Never Smoker  . Smokeless tobacco: Never Used  Substance and Sexual Activity  . Alcohol use: No    Alcohol/week: 0.0 standard drinks  . Drug use: No  . Sexual activity: Never  Lifestyle  . Physical activity:    Days per week: Not on file    Minutes per session: Not on file  . Stress: Not on file  Relationships  . Social connections:    Talks on phone: Not on file    Gets together: Not on file    Attends religious service: Not on file    Active member of club or organization: Not on file    Attends meetings of clubs or organizations: Not on file    Relationship status: Not on file  Other Topics Concern  . Not on file  Social History Narrative  . Not on file     Family History:  The patient's family history includes Breast cancer in her daughter; Colon polyps in her daughter; Diabetes in her brother, daughter, father, and son; Heart  attack in her brother; Heart disease in her father; Stroke in her sister.   ROS:   Please see the history of present illness.    ROS All other systems reviewed and are negative.   PHYSICAL EXAM:   VS:  BP (!) 73/51 (BP Location: Left Arm, Patient Position: Sitting, Cuff Size: Normal)   Pulse 82   Wt 161 lb (73 kg) Comment: yesterday's weight  BMI 27.64 kg/m    GEN: Chronically ill, sitting in wheelchair HEENT: normal  Neck: no JVD, carotid bruits, or masses Cardiac: RRR; no murmurs, rubs, or gallops,no edema  Respiratory:  clear to auscultation bilaterally, normal work of breathing GI: soft, nontender, nondistended, + BS MS: no deformity or atrophy  Skin: warm and dry, no rash Neuro:  Alert and Oriented x 3, Strength and sensation are intact Psych: euthymic mood, full affect  Wt Readings from Last 3 Encounters:  11/12/17 161 lb (73 kg)  10/29/17 164 lb (74.4 kg)  10/19/17 179 lb 0.2 oz (81.2 kg)      Studies/Labs Reviewed:   EKG:  EKG is not ordered today.    Recent Labs: 05/10/2017: TSH 1.36 09/10/2017: B Natriuretic Peptide 264.4 09/14/2017: Magnesium 1.8 10/17/2017:  ALT 8 10/19/2017: BUN 10; Creatinine, Ser 1.59; Hemoglobin 9.3; Platelets 147; Potassium 3.9; Sodium 140   Lipid Panel    Component Value Date/Time   CHOL 154 05/10/2017 1538   TRIG 214.0 (H) 05/10/2017 1538   HDL 31.30 (L) 05/10/2017 1538   CHOLHDL 5 05/10/2017 1538   VLDL 42.8 (H) 05/10/2017 1538   LDLCALC 141 (H) 10/25/2015 1118   LDLDIRECT 91.0 05/10/2017 1538    Additional studies/ records that were reviewed today include:   Echo 09/11/2017 LV EF: 25% -   30% Study Conclusions  - Left ventricle: The cavity size was normal. Wall thickness was   increased in a pattern of mild LVH. Severe anterior and   anterolateral hypokinesis. Apical septal akinesis, apical   inferior akinesis, akinesis of the true apex. Basal-mid   inferolateral akinesis. Systolic function was severely reduced.   The  estimated ejection fraction was in the range of 25% to 30%.   Doppler parameters are consistent with abnormal left ventricular   relaxation (grade 1 diastolic dysfunction). - Aortic valve: Trileaflet; moderately calcified leaflets. There   was no stenosis. There was trivial regurgitation. - Mitral valve: There was mild regurgitation. - Left atrium: The atrium was mildly dilated. - Right ventricle: The cavity size was normal. Systolic function   was mildly reduced. - Tricuspid valve: Peak RV-RA gradient (S): 25 mm Hg. - Pulmonary arteries: PA peak pressure: 28 mm Hg (S). - Inferior vena cava: The vessel was normal in size. The   respirophasic diameter changes were in the normal range (>= 50%),   consistent with normal central venous pressure.  Impressions:  - Normal LV size with mild LV hypertrophy. Multiple wall motion   abnormalities as described above, EF 25-30%. Normal RV size with   mildly decreased systolic function. Mild mitral regurgitation.    ASSESSMENT:    1. Hypotension, unspecified hypotension type   2. Pacemaker   3. Chronic combined systolic and diastolic heart failure ([place])   4. CKD (chronic kidney disease), stage III ([place])   5. Coronary artery disease involving coronary bypass graft of native heart without angina pectoris   6. Hyperlipidemia, unspecified hyperlipidemia type   7. PAF (paroxysmal atrial fibrillation) ([place])   8. Controlled type 2 diabetes mellitus without complication, without long-term current use of insulin ([facility])      PLAN:  In order of problems listed above:  1. Hypotension: Blood pressure has been in the 70s during all 3 visits in the past month.  Patient's blood pressure has been chronically low in the 80s and 90s since last year.  However past month, her blood pressure has been even lower.  I am concerned about hypoperfusion due to decompensating heart failure.  Given her age, she is not a candidate for right heart cath at this time.  I  will obtain CBC, complete metabolic panel and PT/INR.  I have very low threshold to send the patient to the hospital if renal function significantly worsens suggesting hypoperfusion of kidney and cardiogenic shock.  I think her short-term and long-term prognosis is fairly poor.  I discussed her case with DOD Dr. Claiborne Billings.  Her prognosis is less than 6 months.  We will need to discuss end-of-life with her and her family.  2. Chronic combined systolic and diastolic heart failure: Due to hypotension, his Lasix, potassium and tamsulosin was stopped during last office visit.  I will remove them from her medication list.  She does not have significant lower extremity edema, her  lung is clear.  3. History of pacemaker: Followed by Dr. Sallyanne Kuster.  4. CKD stage III: Obtain complete metabolic panel given persistent hypotension  5. CAD s/p CABG: Denies any recent chest pain.  6. Hyperlipidemia: Continue Crestor.  7. PAF: She has switched from Eliquis to Coumadin.  Obtain PT/INR given recent supratherapeutic INR.  8. DM2: Managed by primary care provider.    Medication Adjustments/Labs and Tests Ordered: Current medicines are reviewed at length with the patient today.  Concerns regarding medicines are outlined above.  Medication changes, Labs and Tests ordered today are listed in the Patient Instructions below. Patient Instructions  Medication Instructions:  None  If you need a refill on your cardiac medications before your next appointment, please call your pharmacy.   Lab work: Your physician recommends that you return for lab work today. (CMET, CBC, PT-INR)  If you have labs (blood work) drawn today and your tests are completely normal, you will receive your results only by: Marland Kitchen MyChart Message (if you have MyChart) OR . A paper copy in the mail If you have any lab test that is abnormal or we need to change your treatment, we will call you to review the  results.  Testing/Procedures: none  Follow-Up: At [facility], you and your health needs are our priority.  As part of our continuing mission to provide you with exceptional heart care, we have created designated Provider Care Teams.  These Care Teams include your primary Cardiologist (physician) and Advanced Practice Providers (APPs -  Physician Assistants and Nurse Practitioners) who all work together to provide you with the care you need, when you need it. . Please schedule a follow up appointment with Almyra Deforest, PA in 3-4 weeks, on a day when Dr. Sallyanne Kuster is in the office.   Any Other Special Instructions Will Be Listed Below (If Applicable). Purchase Compression stocking to help manually raise your blood pressure.      Hilbert Corrigan, [place]  11/12/2017 5:26 PM    [facility] [place], [place], [place]  [address] Phone: [phone]; Fax: [phone]

## 2017-11-12 NOTE — Patient Instructions (Signed)
Medication Instructions:  None  If you need a refill on your cardiac medications before your next appointment, please call your pharmacy.   Lab work: Your physician recommends that you return for lab work today. (CMET, CBC, PT-INR)  If you have labs (blood work) drawn today and your tests are completely normal, you will receive your results only by: Marland Kitchen MyChart Message (if you have MyChart) OR . A paper copy in the mail If you have any lab test that is abnormal or we need to change your treatment, we will call you to review the results.  Testing/Procedures: none  Follow-Up: At [facility], you and your health needs are our priority.  As part of our continuing mission to provide you with exceptional heart care, we have created designated Provider Care Teams.  These Care Teams include your primary Cardiologist (physician) and Advanced Practice Providers (APPs -  Physician Assistants and Nurse Practitioners) who all work together to provide you with the care you need, when you need it. . Please schedule a follow up appointment with Almyra Deforest, PA in 3-4 weeks, on a day when Dr. Sallyanne Kuster is in the office.   Any Other Special Instructions Will Be Listed Below (If Applicable). Purchase Compression stocking to help manually raise your blood pressure.

## 2017-11-13 ENCOUNTER — Telehealth: Payer: Self-pay | Admitting: Cardiology

## 2017-11-13 DIAGNOSIS — I10 Essential (primary) hypertension: Secondary | ICD-10-CM | POA: Diagnosis not present

## 2017-11-13 DIAGNOSIS — I5032 Chronic diastolic (congestive) heart failure: Secondary | ICD-10-CM | POA: Diagnosis not present

## 2017-11-13 DIAGNOSIS — N390 Urinary tract infection, site not specified: Secondary | ICD-10-CM | POA: Diagnosis not present

## 2017-11-13 DIAGNOSIS — J45901 Unspecified asthma with (acute) exacerbation: Secondary | ICD-10-CM | POA: Diagnosis not present

## 2017-11-13 LAB — COMPREHENSIVE METABOLIC PANEL
A/G RATIO: 0.8 — AB (ref 1.2–2.2)
ALBUMIN: 2.6 g/dL — AB (ref 3.2–4.6)
ALK PHOS: 115 IU/L (ref 39–117)
ALT: 6 IU/L (ref 0–32)
AST: 22 IU/L (ref 0–40)
BILIRUBIN TOTAL: 0.3 mg/dL (ref 0.0–1.2)
BUN / CREAT RATIO: 7 — AB (ref 12–28)
BUN: 9 mg/dL — ABNORMAL LOW (ref 10–36)
CHLORIDE: 108 mmol/L — AB (ref 96–106)
CO2: 24 mmol/L (ref 20–29)
Calcium: 8.2 mg/dL — ABNORMAL LOW (ref 8.7–10.3)
Creatinine, Ser: 1.23 mg/dL — ABNORMAL HIGH (ref 0.57–1.00)
GFR calc non Af Amer: 37 mL/min/{1.73_m2} — ABNORMAL LOW (ref >59)
GFR, EST AFRICAN AMERICAN: 43 mL/min/{1.73_m2} — AB (ref >59)
GLUCOSE: 179 mg/dL — AB (ref 65–99)
Globulin, Total: 3.1 g/dL (ref 1.5–4.5)
POTASSIUM: 3.6 mmol/L (ref 3.5–5.2)
Sodium: 144 mmol/L (ref 134–144)
TOTAL PROTEIN: 5.7 g/dL — AB (ref 6.0–8.5)

## 2017-11-13 LAB — CBC
Hematocrit: 32.4 % — ABNORMAL LOW (ref 34.0–46.6)
Hemoglobin: 10.2 g/dL — ABNORMAL LOW (ref 11.1–15.9)
MCH: 27.9 pg (ref 26.6–33.0)
MCHC: 31.5 g/dL (ref 31.5–35.7)
MCV: 89 fL (ref 79–97)
PLATELETS: 186 10*3/uL (ref 150–450)
RBC: 3.66 x10E6/uL — ABNORMAL LOW (ref 3.77–5.28)
RDW: 14 % (ref 12.3–15.4)
WBC: 6.3 10*3/uL (ref 3.4–10.8)

## 2017-11-13 LAB — PROTIME-INR
INR: 8.3 (ref 0.8–1.2)
PROTHROMBIN TIME: 71.3 s — AB (ref 9.1–12.0)

## 2017-11-13 NOTE — Telephone Encounter (Signed)
Received outpatient page from [facility] with critical INR value of 8.3.  Patient was seen in the office by Almyra Deforest, PA in which her INR was drawn.  Pharmacy aware of her supratherapeutic INR at office visit.  Patient was given vitamin K prescription in which she has already taken per daughter report.  Home health is to follow tomorrow 11/14/2017 for INR recheck.  Patient and family were instructed to hold Coumadin until that time.  She has no acute signs of bleeding per daughter.   Kathyrn Drown NP-C [place] Pager: [phone]

## 2017-11-14 ENCOUNTER — Ambulatory Visit (INDEPENDENT_AMBULATORY_CARE_PROVIDER_SITE_OTHER): Payer: Medicare HMO | Admitting: Pharmacist Clinician (PhC)/ Clinical Pharmacy Specialist

## 2017-11-14 DIAGNOSIS — I10 Essential (primary) hypertension: Secondary | ICD-10-CM | POA: Diagnosis not present

## 2017-11-14 DIAGNOSIS — Z7901 Long term (current) use of anticoagulants: Secondary | ICD-10-CM

## 2017-11-14 DIAGNOSIS — J45901 Unspecified asthma with (acute) exacerbation: Secondary | ICD-10-CM | POA: Diagnosis not present

## 2017-11-14 DIAGNOSIS — I5032 Chronic diastolic (congestive) heart failure: Secondary | ICD-10-CM | POA: Diagnosis not present

## 2017-11-14 DIAGNOSIS — I482 Chronic atrial fibrillation, unspecified: Secondary | ICD-10-CM

## 2017-11-14 DIAGNOSIS — N390 Urinary tract infection, site not specified: Secondary | ICD-10-CM | POA: Diagnosis not present

## 2017-11-14 LAB — POCT INR: INR: 2.2 (ref 2.0–3.0)

## 2017-11-15 ENCOUNTER — Ambulatory Visit: Payer: Medicare HMO | Admitting: Physician Assistant

## 2017-11-15 DIAGNOSIS — I5032 Chronic diastolic (congestive) heart failure: Secondary | ICD-10-CM | POA: Diagnosis not present

## 2017-11-15 DIAGNOSIS — J45901 Unspecified asthma with (acute) exacerbation: Secondary | ICD-10-CM | POA: Diagnosis not present

## 2017-11-15 DIAGNOSIS — I10 Essential (primary) hypertension: Secondary | ICD-10-CM | POA: Diagnosis not present

## 2017-11-15 DIAGNOSIS — N390 Urinary tract infection, site not specified: Secondary | ICD-10-CM | POA: Diagnosis not present

## 2017-11-16 ENCOUNTER — Ambulatory Visit (INDEPENDENT_AMBULATORY_CARE_PROVIDER_SITE_OTHER): Payer: Medicare HMO | Admitting: Pharmacist Clinician (PhC)/ Clinical Pharmacy Specialist

## 2017-11-16 ENCOUNTER — Other Ambulatory Visit: Payer: Self-pay | Admitting: *Deleted

## 2017-11-16 DIAGNOSIS — I482 Chronic atrial fibrillation, unspecified: Secondary | ICD-10-CM | POA: Diagnosis not present

## 2017-11-16 DIAGNOSIS — Z7901 Long term (current) use of anticoagulants: Secondary | ICD-10-CM

## 2017-11-16 DIAGNOSIS — N390 Urinary tract infection, site not specified: Secondary | ICD-10-CM | POA: Diagnosis not present

## 2017-11-16 DIAGNOSIS — J45901 Unspecified asthma with (acute) exacerbation: Secondary | ICD-10-CM | POA: Diagnosis not present

## 2017-11-16 DIAGNOSIS — I10 Essential (primary) hypertension: Secondary | ICD-10-CM | POA: Diagnosis not present

## 2017-11-16 DIAGNOSIS — I5032 Chronic diastolic (congestive) heart failure: Secondary | ICD-10-CM | POA: Diagnosis not present

## 2017-11-16 LAB — POCT INR: INR: 2 (ref 2.0–3.0)

## 2017-11-16 NOTE — Patient Outreach (Signed)
Patient triggered Red on [facility]i Heart Failure Dashboard, notification sent to:  Kimberly Gibbs, RN 

## 2017-11-16 NOTE — Patient Outreach (Addendum)
[facility]) Care Management  11/16/2017  Lilyana Lippman [place] 22-Apr-1920 [id number]   EMMI- Heart failure  RED ON EMMI ALERT Day # 24 Date: 11/15/17 1316 Red Alert Reason: new/worsening problems? Yes New/worsening shortness of breath? Yes  Insurance [employer]  Outreach attempt # 1 successful to the home number Patient's daughter, [name] is able to verify HIPAA. [name] reports she answered the EMMI questions for Mrs [facility] Care Management RN reviewed and addressed red alert with patient [name] reports that "I pressed the wrong button and it would not let me go back." She confirms Mrs Sciulli does not have any new/worsening problems or sob  She did state that she wanted to speak with CM about something but she would have to return the call She is on her way to a funeral Cm confirmed the contact number for CM    Consent: [facility] RN CM reviewed [facility] services with patient. Patient gave verbal consent for services. Advised patient that there will be further automated EMMI- post discharge calls to assess how the patient is doing following the recent hospitalization Advised the patient that another call may be received from a nurse if any of their responses were abnormal. Patient voiced understanding and was appreciative of f/u call.   Plan: [facility] RN CM will pend this patient for another call within 4 business days and plan for case closure  or speak with daughter upon a return call.   Jenene Kauffmann L. Lavina Hamman, RN, BSN, [place] Coordinator Office number [phone] Mobile number [phone]  Main [facility] number [phone] Fax number [phone]

## 2017-11-18 ENCOUNTER — Inpatient Hospital Stay
Admission: AD | Admit: 2017-11-18 | Payer: Self-pay | Source: Other Acute Inpatient Hospital | Admitting: Internal Medicine

## 2017-11-18 DIAGNOSIS — J9811 Atelectasis: Secondary | ICD-10-CM | POA: Diagnosis not present

## 2017-11-18 DIAGNOSIS — H548 Legal blindness, as defined in USA: Secondary | ICD-10-CM | POA: Diagnosis not present

## 2017-11-18 DIAGNOSIS — Z515 Encounter for palliative care: Secondary | ICD-10-CM | POA: Diagnosis not present

## 2017-11-18 DIAGNOSIS — Z452 Encounter for adjustment and management of vascular access device: Secondary | ICD-10-CM | POA: Diagnosis not present

## 2017-11-18 DIAGNOSIS — Z7901 Long term (current) use of anticoagulants: Secondary | ICD-10-CM | POA: Diagnosis not present

## 2017-11-18 DIAGNOSIS — I509 Heart failure, unspecified: Secondary | ICD-10-CM | POA: Diagnosis not present

## 2017-11-18 DIAGNOSIS — R0989 Other specified symptoms and signs involving the circulatory and respiratory systems: Secondary | ICD-10-CM | POA: Diagnosis not present

## 2017-11-18 DIAGNOSIS — I130 Hypertensive heart and chronic kidney disease with heart failure and stage 1 through stage 4 chronic kidney disease, or unspecified chronic kidney disease: Secondary | ICD-10-CM | POA: Diagnosis not present

## 2017-11-18 DIAGNOSIS — W19XXXA Unspecified fall, initial encounter: Secondary | ICD-10-CM | POA: Diagnosis not present

## 2017-11-18 DIAGNOSIS — Z7984 Long term (current) use of oral hypoglycemic drugs: Secondary | ICD-10-CM | POA: Diagnosis not present

## 2017-11-18 DIAGNOSIS — I959 Hypotension, unspecified: Secondary | ICD-10-CM | POA: Diagnosis not present

## 2017-11-18 DIAGNOSIS — R569 Unspecified convulsions: Secondary | ICD-10-CM | POA: Diagnosis not present

## 2017-11-18 DIAGNOSIS — I501 Left ventricular failure: Secondary | ICD-10-CM | POA: Diagnosis not present

## 2017-11-18 DIAGNOSIS — R4189 Other symptoms and signs involving cognitive functions and awareness: Secondary | ICD-10-CM | POA: Diagnosis not present

## 2017-11-18 DIAGNOSIS — R400 Somnolence: Secondary | ICD-10-CM | POA: Diagnosis not present

## 2017-11-18 DIAGNOSIS — R402310 Coma scale, best motor response, none, unspecified time: Secondary | ICD-10-CM | POA: Diagnosis not present

## 2017-11-18 DIAGNOSIS — N179 Acute kidney failure, unspecified: Secondary | ICD-10-CM | POA: Diagnosis not present

## 2017-11-18 DIAGNOSIS — I252 Old myocardial infarction: Secondary | ICD-10-CM | POA: Diagnosis not present

## 2017-11-18 DIAGNOSIS — Z4682 Encounter for fitting and adjustment of non-vascular catheter: Secondary | ICD-10-CM | POA: Diagnosis not present

## 2017-11-18 DIAGNOSIS — R402110 Coma scale, eyes open, never, unspecified time: Secondary | ICD-10-CM | POA: Diagnosis not present

## 2017-11-18 DIAGNOSIS — R4020 Unspecified coma: Secondary | ICD-10-CM | POA: Diagnosis not present

## 2017-11-18 DIAGNOSIS — I5020 Unspecified systolic (congestive) heart failure: Secondary | ICD-10-CM | POA: Diagnosis not present

## 2017-11-18 DIAGNOSIS — I5022 Chronic systolic (congestive) heart failure: Secondary | ICD-10-CM | POA: Diagnosis not present

## 2017-11-18 DIAGNOSIS — R404 Transient alteration of awareness: Secondary | ICD-10-CM | POA: Diagnosis not present

## 2017-11-18 DIAGNOSIS — G9340 Encephalopathy, unspecified: Secondary | ICD-10-CM | POA: Diagnosis not present

## 2017-11-18 DIAGNOSIS — R918 Other nonspecific abnormal finding of lung field: Secondary | ICD-10-CM | POA: Diagnosis not present

## 2017-11-18 DIAGNOSIS — E872 Acidosis: Secondary | ICD-10-CM | POA: Diagnosis not present

## 2017-11-18 DIAGNOSIS — I472 Ventricular tachycardia: Secondary | ICD-10-CM | POA: Diagnosis not present

## 2017-11-18 DIAGNOSIS — R69 Illness, unspecified: Secondary | ICD-10-CM | POA: Diagnosis not present

## 2017-11-18 DIAGNOSIS — N189 Chronic kidney disease, unspecified: Secondary | ICD-10-CM | POA: Diagnosis not present

## 2017-11-18 DIAGNOSIS — N183 Chronic kidney disease, stage 3 (moderate): Secondary | ICD-10-CM | POA: Diagnosis not present

## 2017-11-18 DIAGNOSIS — E873 Alkalosis: Secondary | ICD-10-CM | POA: Diagnosis not present

## 2017-11-18 DIAGNOSIS — R579 Shock, unspecified: Secondary | ICD-10-CM | POA: Diagnosis not present

## 2017-11-18 DIAGNOSIS — I4891 Unspecified atrial fibrillation: Secondary | ICD-10-CM | POA: Diagnosis not present

## 2017-11-18 DIAGNOSIS — Z87440 Personal history of urinary (tract) infections: Secondary | ICD-10-CM | POA: Diagnosis not present

## 2017-11-18 DIAGNOSIS — G931 Anoxic brain damage, not elsewhere classified: Secondary | ICD-10-CM | POA: Diagnosis not present

## 2017-11-18 DIAGNOSIS — E119 Type 2 diabetes mellitus without complications: Secondary | ICD-10-CM | POA: Diagnosis not present

## 2017-11-18 DIAGNOSIS — R402210 Coma scale, best verbal response, none, unspecified time: Secondary | ICD-10-CM | POA: Diagnosis not present

## 2017-11-18 DIAGNOSIS — R4182 Altered mental status, unspecified: Secondary | ICD-10-CM | POA: Diagnosis not present

## 2017-11-18 DIAGNOSIS — I110 Hypertensive heart disease with heart failure: Secondary | ICD-10-CM | POA: Diagnosis not present

## 2017-11-18 DIAGNOSIS — Z8679 Personal history of other diseases of the circulatory system: Secondary | ICD-10-CM | POA: Diagnosis not present

## 2017-11-18 DIAGNOSIS — R0689 Other abnormalities of breathing: Secondary | ICD-10-CM | POA: Diagnosis not present

## 2017-11-18 DIAGNOSIS — I469 Cardiac arrest, cause unspecified: Secondary | ICD-10-CM | POA: Diagnosis not present

## 2017-11-18 DIAGNOSIS — Z9911 Dependence on respirator [ventilator] status: Secondary | ICD-10-CM | POA: Diagnosis not present

## 2017-11-18 DIAGNOSIS — Z79899 Other long term (current) drug therapy: Secondary | ICD-10-CM | POA: Diagnosis not present

## 2017-11-18 DIAGNOSIS — Z8673 Personal history of transient ischemic attack (TIA), and cerebral infarction without residual deficits: Secondary | ICD-10-CM | POA: Diagnosis not present

## 2017-11-18 DIAGNOSIS — I2510 Atherosclerotic heart disease of native coronary artery without angina pectoris: Secondary | ICD-10-CM | POA: Diagnosis not present

## 2017-11-18 DIAGNOSIS — I517 Cardiomegaly: Secondary | ICD-10-CM | POA: Diagnosis not present

## 2017-11-18 DIAGNOSIS — I499 Cardiac arrhythmia, unspecified: Secondary | ICD-10-CM | POA: Diagnosis not present

## 2017-11-19 ENCOUNTER — Ambulatory Visit: Payer: Self-pay | Admitting: *Deleted

## 2017-11-19 DIAGNOSIS — I469 Cardiac arrest, cause unspecified: Secondary | ICD-10-CM | POA: Diagnosis not present

## 2017-11-19 DIAGNOSIS — H548 Legal blindness, as defined in USA: Secondary | ICD-10-CM | POA: Diagnosis not present

## 2017-11-19 DIAGNOSIS — I4891 Unspecified atrial fibrillation: Secondary | ICD-10-CM | POA: Diagnosis not present

## 2017-11-19 DIAGNOSIS — G9340 Encephalopathy, unspecified: Secondary | ICD-10-CM | POA: Diagnosis not present

## 2017-11-19 DIAGNOSIS — I5020 Unspecified systolic (congestive) heart failure: Secondary | ICD-10-CM | POA: Diagnosis not present

## 2017-11-19 DIAGNOSIS — R0989 Other specified symptoms and signs involving the circulatory and respiratory systems: Secondary | ICD-10-CM | POA: Diagnosis not present

## 2017-11-19 DIAGNOSIS — Z9911 Dependence on respirator [ventilator] status: Secondary | ICD-10-CM | POA: Diagnosis not present

## 2017-11-19 DIAGNOSIS — N179 Acute kidney failure, unspecified: Secondary | ICD-10-CM | POA: Diagnosis not present

## 2017-11-19 DIAGNOSIS — R69 Illness, unspecified: Secondary | ICD-10-CM | POA: Diagnosis not present

## 2017-11-19 DIAGNOSIS — J9811 Atelectasis: Secondary | ICD-10-CM | POA: Diagnosis not present

## 2017-11-19 DIAGNOSIS — R918 Other nonspecific abnormal finding of lung field: Secondary | ICD-10-CM | POA: Diagnosis not present

## 2017-11-19 DIAGNOSIS — Z8673 Personal history of transient ischemic attack (TIA), and cerebral infarction without residual deficits: Secondary | ICD-10-CM | POA: Diagnosis not present

## 2017-11-19 DIAGNOSIS — I959 Hypotension, unspecified: Secondary | ICD-10-CM | POA: Diagnosis not present

## 2017-11-19 DIAGNOSIS — Z452 Encounter for adjustment and management of vascular access device: Secondary | ICD-10-CM | POA: Diagnosis not present

## 2017-11-19 DIAGNOSIS — Z4682 Encounter for fitting and adjustment of non-vascular catheter: Secondary | ICD-10-CM | POA: Diagnosis not present

## 2017-11-19 DIAGNOSIS — Z8679 Personal history of other diseases of the circulatory system: Secondary | ICD-10-CM | POA: Diagnosis not present

## 2017-11-19 DIAGNOSIS — I517 Cardiomegaly: Secondary | ICD-10-CM | POA: Diagnosis not present

## 2017-11-19 DIAGNOSIS — I2510 Atherosclerotic heart disease of native coronary artery without angina pectoris: Secondary | ICD-10-CM | POA: Diagnosis not present

## 2017-11-20 ENCOUNTER — Ambulatory Visit: Payer: Medicare HMO | Admitting: Physician Assistant

## 2017-11-20 ENCOUNTER — Ambulatory Visit: Payer: Self-pay | Admitting: *Deleted

## 2017-11-20 DIAGNOSIS — R4189 Other symptoms and signs involving cognitive functions and awareness: Secondary | ICD-10-CM | POA: Diagnosis not present

## 2017-11-20 DIAGNOSIS — Z515 Encounter for palliative care: Secondary | ICD-10-CM | POA: Diagnosis not present

## 2017-11-20 DIAGNOSIS — I469 Cardiac arrest, cause unspecified: Secondary | ICD-10-CM | POA: Diagnosis not present

## 2017-11-21 ENCOUNTER — Ambulatory Visit: Payer: Medicare HMO | Admitting: Physician Assistant

## 2017-11-21 ENCOUNTER — Telehealth: Payer: Self-pay | Admitting: Cardiovascular Disease

## 2017-11-21 DIAGNOSIS — G9340 Encephalopathy, unspecified: Secondary | ICD-10-CM | POA: Diagnosis not present

## 2017-11-21 DIAGNOSIS — I469 Cardiac arrest, cause unspecified: Secondary | ICD-10-CM | POA: Diagnosis not present

## 2017-11-21 DIAGNOSIS — I5020 Unspecified systolic (congestive) heart failure: Secondary | ICD-10-CM | POA: Diagnosis not present

## 2017-11-21 DIAGNOSIS — R579 Shock, unspecified: Secondary | ICD-10-CM | POA: Diagnosis not present

## 2017-11-21 DIAGNOSIS — N183 Chronic kidney disease, stage 3 (moderate): Secondary | ICD-10-CM | POA: Diagnosis not present

## 2017-11-21 DIAGNOSIS — N179 Acute kidney failure, unspecified: Secondary | ICD-10-CM | POA: Diagnosis not present

## 2017-11-21 MED ORDER — LEVOTHYROXINE SODIUM 125 MCG PO TABS
62.50 | ORAL_TABLET | ORAL | Status: DC
Start: 2017-11-23 — End: 2017-11-21

## 2017-11-21 MED ORDER — DOPAMINE IN D5W 1.6-5 MG/ML-% IV SOLN
2.00 | INTRAVENOUS | Status: DC
Start: ? — End: 2017-11-21

## 2017-11-21 MED ORDER — INSULIN LISPRO 100 UNIT/ML ~~LOC~~ SOLN
0.00 | SUBCUTANEOUS | Status: DC
Start: 2017-11-21 — End: 2017-11-21

## 2017-11-21 MED ORDER — GENERIC EXTERNAL MEDICATION
40.00 | Status: DC
Start: 2017-11-22 — End: 2017-11-21

## 2017-11-21 MED ORDER — CHLORHEXIDINE GLUCONATE 0.12 % MT SOLN
5.00 | OROMUCOSAL | Status: DC
Start: 2017-11-23 — End: 2017-11-21

## 2017-11-21 MED ORDER — GENERIC EXTERNAL MEDICATION
0.50 | Status: DC
Start: ? — End: 2017-11-21

## 2017-11-21 MED ORDER — DEXTROSE 10 % IV SOLN
12.50 | INTRAVENOUS | Status: DC
Start: ? — End: 2017-11-21

## 2017-11-21 MED ORDER — LEVETIRACETAM 500 MG PO TABS
750.00 | ORAL_TABLET | ORAL | Status: DC
Start: 2017-11-23 — End: 2017-11-21

## 2017-11-21 MED ORDER — ACETAMINOPHEN 325 MG PO TABS
650.00 | ORAL_TABLET | ORAL | Status: DC
Start: 2017-11-22 — End: 2017-11-21

## 2017-11-21 MED ORDER — GENERIC EXTERNAL MEDICATION
1.50 | Status: DC
Start: 2017-11-23 — End: 2017-11-21

## 2017-11-21 NOTE — Telephone Encounter (Signed)
Patient's daughter called to make [place] aware that her mother had a heart attack, and is in [facility].

## 2017-11-21 NOTE — Telephone Encounter (Signed)
Really sorry to hear that. I looked through the records, including the pacemaker report (showing the reason for her cardiac arrest - VT>250 bpm) and the current clinical situation (unfortunately poor outlook - likely significant neurological injury, poor prognosis). Please offer them my best wishes, but I am not optimisstic for meaningful recovery MCr

## 2017-11-21 NOTE — Telephone Encounter (Signed)
Returned call to daughter-made aware records can be reviewed via Care Everywhere.   Advised would make Dr. Sallyanne Kuster aware of admission.  Daughter verbalized understanding.

## 2017-11-22 DIAGNOSIS — Z515 Encounter for palliative care: Secondary | ICD-10-CM | POA: Diagnosis not present

## 2017-11-22 DIAGNOSIS — I469 Cardiac arrest, cause unspecified: Secondary | ICD-10-CM | POA: Diagnosis not present

## 2017-11-22 DIAGNOSIS — Z9911 Dependence on respirator [ventilator] status: Secondary | ICD-10-CM | POA: Diagnosis not present

## 2017-11-22 DIAGNOSIS — R4189 Other symptoms and signs involving cognitive functions and awareness: Secondary | ICD-10-CM | POA: Diagnosis not present

## 2017-11-22 MED ORDER — CARBOXYMETHYLCELLULOSE SODIUM 0.25 % OP SOLN
1.00 | OPHTHALMIC | Status: DC
Start: 2017-11-23 — End: 2017-11-22

## 2017-11-22 MED ORDER — GENERIC EXTERNAL MEDICATION
0.00 | Status: DC
Start: ? — End: 2017-11-22

## 2017-11-22 MED ORDER — INSULIN LISPRO 100 UNIT/ML ~~LOC~~ SOLN
0.00 | SUBCUTANEOUS | Status: DC
Start: 2017-11-22 — End: 2017-11-22

## 2017-11-22 MED ORDER — GENERIC EXTERNAL MEDICATION
2.00 | Status: DC
Start: 2017-11-23 — End: 2017-11-22

## 2017-11-22 MED ORDER — MORPHINE SULFATE 4 MG/ML IJ SOLN
5.00 | INTRAMUSCULAR | Status: DC
Start: ? — End: 2017-11-22

## 2017-11-22 MED ORDER — MIDAZOLAM HCL-SODIUM CHLORIDE 100-0.9 MG/100ML-% IV SOLN
2.00 | INTRAVENOUS | Status: DC
Start: ? — End: 2017-11-22

## 2017-11-22 MED ORDER — MORPHINE SULFATE 4 MG/ML IJ SOLN
5.00 | INTRAMUSCULAR | Status: DC
Start: 2017-11-23 — End: 2017-11-22

## 2017-11-22 MED ORDER — GENERIC EXTERNAL MEDICATION
2.00 | Status: DC
Start: ? — End: 2017-11-22

## 2017-11-23 ENCOUNTER — Ambulatory Visit: Payer: Self-pay | Admitting: *Deleted

## 2017-11-23 DIAGNOSIS — G9340 Encephalopathy, unspecified: Secondary | ICD-10-CM | POA: Diagnosis not present

## 2017-11-23 DIAGNOSIS — I469 Cardiac arrest, cause unspecified: Secondary | ICD-10-CM | POA: Diagnosis not present

## 2017-11-23 DIAGNOSIS — Z9911 Dependence on respirator [ventilator] status: Secondary | ICD-10-CM | POA: Diagnosis not present

## 2017-11-23 DIAGNOSIS — R579 Shock, unspecified: Secondary | ICD-10-CM | POA: Diagnosis not present

## 2017-11-23 DIAGNOSIS — Z515 Encounter for palliative care: Secondary | ICD-10-CM | POA: Diagnosis not present

## 2017-11-23 DIAGNOSIS — I5020 Unspecified systolic (congestive) heart failure: Secondary | ICD-10-CM | POA: Diagnosis not present

## 2017-11-23 DIAGNOSIS — R4189 Other symptoms and signs involving cognitive functions and awareness: Secondary | ICD-10-CM | POA: Diagnosis not present

## 2017-11-23 MED ORDER — MORPHINE SULFATE 4 MG/ML IJ SOLN
4.00 | INTRAMUSCULAR | Status: DC
Start: ? — End: 2017-11-23

## 2017-11-23 MED ORDER — SCOPOLAMINE 1 MG/3DAYS TD PT72
1.00 | MEDICATED_PATCH | TRANSDERMAL | Status: DC
Start: 2017-11-25 — End: 2017-11-23

## 2017-11-23 MED ORDER — GENERIC EXTERNAL MEDICATION
2.00 | Status: DC
Start: ? — End: 2017-11-23

## 2017-11-26 ENCOUNTER — Ambulatory Visit: Payer: Self-pay | Admitting: *Deleted

## 2017-11-26 ENCOUNTER — Encounter: Payer: Medicare HMO | Admitting: Cardiovascular Disease

## 2017-11-27 ENCOUNTER — Ambulatory Visit: Payer: Self-pay | Admitting: *Deleted

## 2017-11-28 ENCOUNTER — Other Ambulatory Visit: Payer: Self-pay | Admitting: *Deleted

## 2017-11-28 NOTE — Patient Outreach (Addendum)
[facility]) Care Management  11/28/2017  [name] Jan 12, 1921 [id number]   Care coordination and case closure   EMMI- Heart failure  RED ON EMMI ALERT Day # 24 Date: 11/15/17 1316 Red Alert Reason: new/worsening problems? Yes New/worsening shortness of breath? Yes  Insurance [employer] medicare  [facility] RN CM reached Mrs Parlin's daughter, Dorann Ou states Mrs Uselman passed mi in church in [place] on 11/18/17 sitting beside her sister Mrs Kunzman was taken to [facility], placed on a ventilator and passed away per her wishes   Plan case closure Mrs Petrides passed on 12-09-2017 at  [facility] after cardiac arrest from ventricular arrhythmia secondary to ischemic cardiomyopathy   [facility] RN CM updated [facility] CMA to confirm discontinuation of EMMI program   [place] L. Lavina Hamman, RN, BSN, [place] Coordinator Office number [phone] Mobile number [phone]  Main [facility] number [phone] number [phone]

## 2017-11-30 LAB — CUP PACEART INCLINIC DEVICE CHECK
Battery Impedance: 177 Ohm
Battery Remaining Longevity: 111 mo
Battery Voltage: 2.79 V
Brady Statistic AS VP Percent: 95 %
Date Time Interrogation Session: 20191014131133
Implantable Lead Implant Date: 20080613
Implantable Lead Implant Date: 20080613
Implantable Lead Location: 753860
Implantable Lead Model: 4092
Implantable Lead Model: 5076
Lead Channel Impedance Value: 640 Ohm
Lead Channel Pacing Threshold Amplitude: 1 V
Lead Channel Pacing Threshold Pulse Width: 0.4 ms
Lead Channel Setting Sensing Sensitivity: 5.6 mV
MDC IDC LEAD LOCATION: 753859
MDC IDC MSMT LEADCHNL RA IMPEDANCE VALUE: 387 Ohm
MDC IDC MSMT LEADCHNL RA PACING THRESHOLD PULSEWIDTH: 0.4 ms
MDC IDC MSMT LEADCHNL RV PACING THRESHOLD AMPLITUDE: 0.875 V
MDC IDC PG IMPLANT DT: 20170606
MDC IDC SET LEADCHNL RA PACING AMPLITUDE: 2 V
MDC IDC SET LEADCHNL RV PACING AMPLITUDE: 2.5 V
MDC IDC SET LEADCHNL RV PACING PULSEWIDTH: 0.4 ms
MDC IDC STAT BRADY AP VP PERCENT: 4 %
MDC IDC STAT BRADY AP VS PERCENT: 0 %
MDC IDC STAT BRADY AS VS PERCENT: 1 %

## 2017-12-07 ENCOUNTER — Ambulatory Visit: Payer: Medicare HMO | Admitting: Endocrinology

## 2017-12-16 DEATH — deceased

## 2018-07-11 ENCOUNTER — Other Ambulatory Visit: Payer: Self-pay | Admitting: Endocrinology
